# Patient Record
Sex: Female | Born: 1955
Health system: Southern US, Community
[De-identification: ages and names within clinical notes are randomized; demographics above are authoritative.]

## PROBLEM LIST (undated history)

## (undated) DIAGNOSIS — E789 Disorder of lipoprotein metabolism, unspecified: Secondary | ICD-10-CM

## (undated) DIAGNOSIS — G4730 Sleep apnea, unspecified: Secondary | ICD-10-CM

## (undated) DIAGNOSIS — N3000 Acute cystitis without hematuria: Secondary | ICD-10-CM

## (undated) DIAGNOSIS — Z9889 Other specified postprocedural states: Secondary | ICD-10-CM

## (undated) DIAGNOSIS — F32A Depression, unspecified: Secondary | ICD-10-CM

## (undated) DIAGNOSIS — M549 Dorsalgia, unspecified: Secondary | ICD-10-CM

## (undated) DIAGNOSIS — F419 Anxiety disorder, unspecified: Secondary | ICD-10-CM

## (undated) DIAGNOSIS — I5030 Unspecified diastolic (congestive) heart failure: Secondary | ICD-10-CM

## (undated) DIAGNOSIS — R7303 Prediabetes: Secondary | ICD-10-CM

## (undated) DIAGNOSIS — G43909 Migraine, unspecified, not intractable, without status migrainosus: Secondary | ICD-10-CM

## (undated) DIAGNOSIS — T8859XA Other complications of anesthesia, initial encounter: Secondary | ICD-10-CM

## (undated) DIAGNOSIS — E213 Hyperparathyroidism, unspecified: Secondary | ICD-10-CM

## (undated) DIAGNOSIS — E119 Type 2 diabetes mellitus without complications: Secondary | ICD-10-CM

## (undated) DIAGNOSIS — N189 Chronic kidney disease, unspecified: Secondary | ICD-10-CM

## (undated) DIAGNOSIS — M1990 Unspecified osteoarthritis, unspecified site: Secondary | ICD-10-CM

## (undated) DIAGNOSIS — R112 Nausea with vomiting, unspecified: Secondary | ICD-10-CM

## (undated) DIAGNOSIS — F329 Major depressive disorder, single episode, unspecified: Secondary | ICD-10-CM

## (undated) DIAGNOSIS — H269 Unspecified cataract: Secondary | ICD-10-CM

## (undated) DIAGNOSIS — E039 Hypothyroidism, unspecified: Secondary | ICD-10-CM

## (undated) DIAGNOSIS — I10 Essential (primary) hypertension: Secondary | ICD-10-CM

## (undated) DIAGNOSIS — G709 Myoneural disorder, unspecified: Secondary | ICD-10-CM

## (undated) DIAGNOSIS — M7062 Trochanteric bursitis, left hip: Secondary | ICD-10-CM

## (undated) DIAGNOSIS — M7061 Trochanteric bursitis, right hip: Secondary | ICD-10-CM

## (undated) DIAGNOSIS — I509 Heart failure, unspecified: Secondary | ICD-10-CM

## (undated) DIAGNOSIS — G8929 Other chronic pain: Secondary | ICD-10-CM

## (undated) DIAGNOSIS — R569 Unspecified convulsions: Secondary | ICD-10-CM

## (undated) DIAGNOSIS — K219 Gastro-esophageal reflux disease without esophagitis: Secondary | ICD-10-CM

## (undated) DIAGNOSIS — R55 Syncope and collapse: Secondary | ICD-10-CM

## (undated) DIAGNOSIS — E785 Hyperlipidemia, unspecified: Secondary | ICD-10-CM

## (undated) DIAGNOSIS — E7800 Pure hypercholesterolemia, unspecified: Secondary | ICD-10-CM

## (undated) DIAGNOSIS — F333 Major depressive disorder, recurrent, severe with psychotic symptoms: Secondary | ICD-10-CM

## (undated) DIAGNOSIS — R000 Tachycardia, unspecified: Secondary | ICD-10-CM

## (undated) DIAGNOSIS — G9340 Encephalopathy, unspecified: Secondary | ICD-10-CM

## (undated) HISTORY — DX: Acute cystitis without hematuria: N30.00

## (undated) HISTORY — DX: Syncope and collapse: R55

## (undated) HISTORY — DX: Tachycardia, unspecified: R00.0

## (undated) HISTORY — DX: Unspecified convulsions: R56.9

## (undated) HISTORY — DX: Hypothyroidism, unspecified: E03.9

## (undated) HISTORY — DX: Anxiety disorder, unspecified: F41.9

## (undated) HISTORY — PX: BACK SURGERY: SHX140

## (undated) HISTORY — DX: Encephalopathy, unspecified: G93.40

## (undated) HISTORY — DX: Hyperlipidemia, unspecified: E78.5

## (undated) HISTORY — DX: Hyperparathyroidism, unspecified: E21.3

## (undated) HISTORY — DX: Unspecified diastolic (congestive) heart failure: I50.30

## (undated) HISTORY — PX: CATARACT EXTRACTION: SUR2

## (undated) HISTORY — PX: KNEE SURGERY: SHX244

## (undated) HISTORY — DX: Unspecified cataract: H26.9

## (undated) HISTORY — DX: Major depressive disorder, recurrent, severe with psychotic symptoms: F33.3

## (undated) HISTORY — DX: Trochanteric bursitis, right hip: M70.61

## (undated) HISTORY — DX: Chronic kidney disease, unspecified: N18.9

## (undated) HISTORY — DX: Trochanteric bursitis, left hip: M70.62

## (undated) HISTORY — DX: Gastro-esophageal reflux disease without esophagitis: K21.9

## (undated) HISTORY — PX: ABDOMINAL HYSTERECTOMY: SHX81

---

## 1984-05-17 HISTORY — PX: APPENDECTOMY: SHX54

## 2001-10-30 ENCOUNTER — Other Ambulatory Visit: Admission: RE | Admit: 2001-10-30 | Discharge: 2001-10-30 | Payer: Self-pay | Admitting: Obstetrics and Gynecology

## 2001-11-01 ENCOUNTER — Ambulatory Visit (HOSPITAL_COMMUNITY): Admission: RE | Admit: 2001-11-01 | Discharge: 2001-11-01 | Payer: Self-pay | Admitting: Obstetrics and Gynecology

## 2001-11-01 ENCOUNTER — Encounter: Payer: Self-pay | Admitting: Obstetrics and Gynecology

## 2001-12-20 ENCOUNTER — Ambulatory Visit (HOSPITAL_COMMUNITY): Admission: RE | Admit: 2001-12-20 | Discharge: 2001-12-20 | Payer: Self-pay | Admitting: Obstetrics and Gynecology

## 2001-12-20 ENCOUNTER — Encounter: Payer: Self-pay | Admitting: Obstetrics and Gynecology

## 2002-02-27 ENCOUNTER — Encounter (INDEPENDENT_AMBULATORY_CARE_PROVIDER_SITE_OTHER): Payer: Self-pay | Admitting: Specialist

## 2002-02-28 ENCOUNTER — Inpatient Hospital Stay (HOSPITAL_COMMUNITY): Admission: RE | Admit: 2002-02-28 | Discharge: 2002-02-28 | Payer: Self-pay | Admitting: Obstetrics and Gynecology

## 2004-07-02 ENCOUNTER — Ambulatory Visit: Payer: Self-pay | Admitting: Cardiology

## 2004-07-14 ENCOUNTER — Ambulatory Visit: Payer: Self-pay | Admitting: Cardiology

## 2009-09-16 ENCOUNTER — Ambulatory Visit (HOSPITAL_COMMUNITY): Admission: RE | Admit: 2009-09-16 | Discharge: 2009-09-16 | Payer: Self-pay | Admitting: Internal Medicine

## 2009-09-30 ENCOUNTER — Encounter: Admission: RE | Admit: 2009-09-30 | Discharge: 2009-09-30 | Payer: Self-pay | Admitting: Internal Medicine

## 2009-12-02 ENCOUNTER — Emergency Department (HOSPITAL_COMMUNITY): Admission: EM | Admit: 2009-12-02 | Discharge: 2009-12-02 | Payer: Self-pay | Admitting: Family Medicine

## 2009-12-10 ENCOUNTER — Encounter: Admission: RE | Admit: 2009-12-10 | Discharge: 2010-02-12 | Payer: Self-pay | Admitting: Internal Medicine

## 2009-12-16 ENCOUNTER — Ambulatory Visit (HOSPITAL_COMMUNITY): Admission: RE | Admit: 2009-12-16 | Discharge: 2009-12-16 | Payer: Self-pay | Admitting: Internal Medicine

## 2010-05-14 ENCOUNTER — Ambulatory Visit (HOSPITAL_COMMUNITY)
Admission: RE | Admit: 2010-05-14 | Discharge: 2010-05-15 | Payer: Self-pay | Source: Home / Self Care | Attending: Neurosurgery | Admitting: Neurosurgery

## 2010-05-17 DIAGNOSIS — M7061 Trochanteric bursitis, right hip: Secondary | ICD-10-CM

## 2010-05-17 HISTORY — DX: Trochanteric bursitis, right hip: M70.61

## 2010-06-07 ENCOUNTER — Encounter: Payer: Self-pay | Admitting: Internal Medicine

## 2010-06-16 ENCOUNTER — Encounter (HOSPITAL_COMMUNITY)
Admission: RE | Admit: 2010-06-16 | Discharge: 2010-06-16 | Payer: Self-pay | Source: Home / Self Care | Attending: Neurosurgery | Admitting: Neurosurgery

## 2010-06-18 ENCOUNTER — Ambulatory Visit (HOSPITAL_COMMUNITY): Payer: Self-pay

## 2010-06-18 ENCOUNTER — Ambulatory Visit (HOSPITAL_COMMUNITY): Payer: PRIVATE HEALTH INSURANCE | Attending: Internal Medicine

## 2010-06-18 DIAGNOSIS — I10 Essential (primary) hypertension: Secondary | ICD-10-CM | POA: Insufficient documentation

## 2010-06-18 DIAGNOSIS — M545 Low back pain, unspecified: Secondary | ICD-10-CM | POA: Insufficient documentation

## 2010-06-18 DIAGNOSIS — IMO0001 Reserved for inherently not codable concepts without codable children: Secondary | ICD-10-CM | POA: Insufficient documentation

## 2010-06-18 DIAGNOSIS — M6281 Muscle weakness (generalized): Secondary | ICD-10-CM | POA: Insufficient documentation

## 2010-06-18 DIAGNOSIS — R262 Difficulty in walking, not elsewhere classified: Secondary | ICD-10-CM | POA: Insufficient documentation

## 2010-06-19 ENCOUNTER — Ambulatory Visit (HOSPITAL_COMMUNITY)
Admission: RE | Admit: 2010-06-19 | Discharge: 2010-06-19 | Disposition: A | Discharge status: Home / Self Care | Payer: PRIVATE HEALTH INSURANCE | Source: Ambulatory Visit | Attending: Neurosurgery | Admitting: Neurosurgery

## 2010-06-19 DIAGNOSIS — IMO0001 Reserved for inherently not codable concepts without codable children: Secondary | ICD-10-CM | POA: Insufficient documentation

## 2010-06-19 DIAGNOSIS — M545 Low back pain, unspecified: Secondary | ICD-10-CM | POA: Insufficient documentation

## 2010-06-19 DIAGNOSIS — R262 Difficulty in walking, not elsewhere classified: Secondary | ICD-10-CM | POA: Insufficient documentation

## 2010-06-19 DIAGNOSIS — I10 Essential (primary) hypertension: Secondary | ICD-10-CM | POA: Insufficient documentation

## 2010-06-19 DIAGNOSIS — M6281 Muscle weakness (generalized): Secondary | ICD-10-CM | POA: Insufficient documentation

## 2010-06-22 ENCOUNTER — Ambulatory Visit (HOSPITAL_COMMUNITY): Payer: Self-pay | Admitting: Physical Therapy

## 2010-06-24 ENCOUNTER — Ambulatory Visit (HOSPITAL_COMMUNITY): Payer: Self-pay | Admitting: Physical Therapy

## 2010-06-26 ENCOUNTER — Encounter (HOSPITAL_COMMUNITY)
Admission: RE | Admit: 2010-06-26 | Discharge: 2010-06-26 | Disposition: A | Discharge status: Home / Self Care | Payer: PRIVATE HEALTH INSURANCE | Source: Ambulatory Visit | Attending: Neurosurgery | Admitting: Neurosurgery

## 2010-07-08 ENCOUNTER — Other Ambulatory Visit (HOSPITAL_COMMUNITY): Payer: Self-pay | Admitting: Neurosurgery

## 2010-07-08 DIAGNOSIS — IMO0002 Reserved for concepts with insufficient information to code with codable children: Secondary | ICD-10-CM

## 2010-07-10 ENCOUNTER — Ambulatory Visit (HOSPITAL_COMMUNITY)
Admission: RE | Admit: 2010-07-10 | Discharge: 2010-07-10 | Disposition: A | Discharge status: Home / Self Care | Payer: PRIVATE HEALTH INSURANCE | Source: Ambulatory Visit | Attending: Neurosurgery | Admitting: Neurosurgery

## 2010-07-10 DIAGNOSIS — M79609 Pain in unspecified limb: Secondary | ICD-10-CM | POA: Insufficient documentation

## 2010-07-10 DIAGNOSIS — M545 Low back pain, unspecified: Secondary | ICD-10-CM | POA: Insufficient documentation

## 2010-07-10 DIAGNOSIS — IMO0002 Reserved for concepts with insufficient information to code with codable children: Secondary | ICD-10-CM

## 2010-07-10 DIAGNOSIS — M519 Unspecified thoracic, thoracolumbar and lumbosacral intervertebral disc disorder: Secondary | ICD-10-CM | POA: Insufficient documentation

## 2010-07-10 DIAGNOSIS — Z981 Arthrodesis status: Secondary | ICD-10-CM | POA: Insufficient documentation

## 2010-07-17 ENCOUNTER — Ambulatory Visit (HOSPITAL_COMMUNITY)
Admission: RE | Admit: 2010-07-17 | Discharge: 2010-07-17 | Disposition: A | Discharge status: Home / Self Care | Payer: PRIVATE HEALTH INSURANCE | Source: Ambulatory Visit | Attending: Neurosurgery | Admitting: Neurosurgery

## 2010-07-17 ENCOUNTER — Other Ambulatory Visit (HOSPITAL_COMMUNITY): Payer: Self-pay | Admitting: Neurosurgery

## 2010-07-17 DIAGNOSIS — M79609 Pain in unspecified limb: Secondary | ICD-10-CM | POA: Insufficient documentation

## 2010-07-17 DIAGNOSIS — IMO0002 Reserved for concepts with insufficient information to code with codable children: Secondary | ICD-10-CM

## 2010-07-17 DIAGNOSIS — Z981 Arthrodesis status: Secondary | ICD-10-CM | POA: Insufficient documentation

## 2010-07-17 DIAGNOSIS — M519 Unspecified thoracic, thoracolumbar and lumbosacral intervertebral disc disorder: Secondary | ICD-10-CM | POA: Insufficient documentation

## 2010-07-17 LAB — CREATININE, SERUM
Creatinine, Ser: 1.28 mg/dL — ABNORMAL HIGH (ref 0.4–1.2)
GFR calc Af Amer: 52 mL/min — ABNORMAL LOW (ref >60)
GFR calc non Af Amer: 43 mL/min — ABNORMAL LOW (ref >60)

## 2010-07-17 MED ORDER — GADOBENATE DIMEGLUMINE 529 MG/ML IV SOLN
15.0000 mL | Freq: Once | INTRAVENOUS | Status: AC
  Administered 2010-07-17: 15 mL via INTRAVENOUS

## 2010-07-27 LAB — BASIC METABOLIC PANEL
BUN: 15 mg/dL (ref 6–23)
CO2: 24 mEq/L (ref 19–32)
Calcium: 9.5 mg/dL (ref 8.4–10.5)
Chloride: 107 mEq/L (ref 96–112)
Creatinine, Ser: 1.28 mg/dL — ABNORMAL HIGH (ref 0.4–1.2)
GFR calc Af Amer: 52 mL/min — ABNORMAL LOW (ref >60)
GFR calc non Af Amer: 43 mL/min — ABNORMAL LOW (ref >60)
Glucose, Bld: 92 mg/dL (ref 70–99)
Potassium: 4.1 mEq/L (ref 3.5–5.1)
Sodium: 138 mEq/L (ref 135–145)

## 2010-07-27 LAB — ABO/RH: ABO/RH(D): O NEG

## 2010-07-27 LAB — CBC
HCT: 38.4 % (ref 36.0–46.0)
Hemoglobin: 12.3 g/dL (ref 12.0–15.0)
MCH: 28.6 pg (ref 26.0–34.0)
MCHC: 32 g/dL (ref 30.0–36.0)
MCV: 89.3 fL (ref 78.0–100.0)
Platelets: 244 10*3/uL (ref 150–400)
RBC: 4.3 MIL/uL (ref 3.87–5.11)
RDW: 14.6 % (ref 11.5–15.5)
WBC: 10.6 10*3/uL — ABNORMAL HIGH (ref 4.0–10.5)

## 2010-07-27 LAB — SURGICAL PCR SCREEN
MRSA, PCR: NEGATIVE
Staphylococcus aureus: POSITIVE — AB

## 2010-07-27 LAB — TYPE AND SCREEN
ABO/RH(D): O NEG
Antibody Screen: NEGATIVE

## 2010-08-11 ENCOUNTER — Ambulatory Visit: Payer: PRIVATE HEALTH INSURANCE | Attending: Neurosurgery | Admitting: Physical Therapy

## 2010-08-11 DIAGNOSIS — M25659 Stiffness of unspecified hip, not elsewhere classified: Secondary | ICD-10-CM | POA: Insufficient documentation

## 2010-08-11 DIAGNOSIS — M545 Low back pain, unspecified: Secondary | ICD-10-CM | POA: Insufficient documentation

## 2010-08-11 DIAGNOSIS — IMO0001 Reserved for inherently not codable concepts without codable children: Secondary | ICD-10-CM | POA: Insufficient documentation

## 2010-08-18 ENCOUNTER — Ambulatory Visit: Payer: PRIVATE HEALTH INSURANCE | Attending: Neurosurgery | Admitting: Physical Therapy

## 2010-08-18 DIAGNOSIS — M545 Low back pain, unspecified: Secondary | ICD-10-CM | POA: Insufficient documentation

## 2010-08-18 DIAGNOSIS — IMO0001 Reserved for inherently not codable concepts without codable children: Secondary | ICD-10-CM | POA: Insufficient documentation

## 2010-08-18 DIAGNOSIS — M25659 Stiffness of unspecified hip, not elsewhere classified: Secondary | ICD-10-CM | POA: Insufficient documentation

## 2010-08-20 ENCOUNTER — Ambulatory Visit: Payer: PRIVATE HEALTH INSURANCE | Admitting: Physical Therapy

## 2010-08-25 ENCOUNTER — Ambulatory Visit: Payer: PRIVATE HEALTH INSURANCE | Admitting: Physical Therapy

## 2010-08-27 ENCOUNTER — Ambulatory Visit: Payer: PRIVATE HEALTH INSURANCE | Admitting: Physical Therapy

## 2010-09-03 ENCOUNTER — Ambulatory Visit: Payer: PRIVATE HEALTH INSURANCE | Admitting: Physical Therapy

## 2010-09-04 ENCOUNTER — Ambulatory Visit: Payer: PRIVATE HEALTH INSURANCE | Admitting: Physical Therapy

## 2010-09-07 ENCOUNTER — Ambulatory Visit: Payer: PRIVATE HEALTH INSURANCE | Admitting: Physical Therapy

## 2010-09-15 ENCOUNTER — Ambulatory Visit: Payer: PRIVATE HEALTH INSURANCE | Attending: Neurosurgery | Admitting: Physical Therapy

## 2010-09-15 DIAGNOSIS — IMO0001 Reserved for inherently not codable concepts without codable children: Secondary | ICD-10-CM | POA: Insufficient documentation

## 2010-09-15 DIAGNOSIS — M545 Low back pain, unspecified: Secondary | ICD-10-CM | POA: Insufficient documentation

## 2010-09-15 DIAGNOSIS — M25659 Stiffness of unspecified hip, not elsewhere classified: Secondary | ICD-10-CM | POA: Insufficient documentation

## 2010-09-17 ENCOUNTER — Ambulatory Visit: Payer: PRIVATE HEALTH INSURANCE | Admitting: Physical Therapy

## 2010-09-22 ENCOUNTER — Ambulatory Visit: Payer: PRIVATE HEALTH INSURANCE | Admitting: Physical Therapy

## 2010-09-24 ENCOUNTER — Ambulatory Visit: Payer: PRIVATE HEALTH INSURANCE | Admitting: Physical Therapy

## 2010-09-29 ENCOUNTER — Ambulatory Visit: Payer: PRIVATE HEALTH INSURANCE | Admitting: Physical Therapy

## 2010-10-01 ENCOUNTER — Ambulatory Visit: Payer: PRIVATE HEALTH INSURANCE | Admitting: Physical Therapy

## 2010-10-02 NOTE — Op Note (Signed)
NAME:  Jennifer Davidson, DANTE NO.:  [id number]   MEDICAL RECORD NO.:  [id number]                   PATIENT TYPE:  AMB   LOCATION:  DAY                                  FACILITY:  [facility]   PHYSICIAN:  Malachi Pro. Ambrose Mantle, M.D.              DATE OF BIRTH:  1955-08-07   DATE OF PROCEDURE:  02/27/2002  DATE OF DISCHARGE:                                 OPERATIVE REPORT   PREOPERATIVE DIAGNOSES:  Severe menorrhagia and dysmenorrhea unresponsive to  medical therapy, recently developed left adnexal mass.   POSTOPERATIVE DIAGNOSES:  1. Endometriosis of the right fallopian tube at the site of the previous     tubal ligation.  2. Adherence of the left ovary to the left posterior broad ligament with a     probable corpus lutein cyst.   OPERATION:  Abdominal hysterectomy, bilateral salpingo-oophorectomy, lysis  of adhesions.   OPERATOR:  Malachi Pro. Ambrose Mantle, M.D.   ASSISTANT:  Zenaida Niece, M.D.   ANESTHESIA:  General.   DESCRIPTION OF PROCEDURE:  The patient was brought to the operating room and  placed under satisfactory general anesthesia.  The abdomen, vulva, vagina,  and urethra were prepped with Betadine solution and draped as a sterile  field.  The patient had had a previous transverse incision suprapubically.  This incision was utilized.  The incision was actually to the right of the  midline, so some of the incision was not used on the right side, and the  incision was extended a little to the left of the scar on the left side.  It  was carried in layers through the skin, subcutaneous tissue, and fascia.  The fascia was then divided transversely.  The rectus muscle was separated  from the pubis and superiorly, and then the peritoneum was opened  vertically.  Exploration of the upper abdomen revealed the liver to be  smooth; the gallbladder was smooth and cystic.  I could feel the right  kidney well.  It was normal.  The left kidney felt normal, although  I could  not feel it as well.  Exploration of the pelvis revealed some omental  adhesions to the peritoneum at the level of the umbilicus, and these were  divided.  There was also an adhesion of the cecum to the anterior cul-de-  sac, and this was divided.  The bowel was then packed away.  The uterus was  elevated into the operative field.  The posterior cul-de-sac was normal.  The uterus appeared relatively normal as did the anterior cul-de-sac.  There  was endometriosis at the slot where the right tube had been previously  ligated.  The right ovary appeared somewhat atrophic.  The left ovary was  approximately 4 x 4 cm and densely adherent to the left posterior broad  ligament.  Both round ligaments were divided with the electrical current;  bladder flap was  developed; bladder was pushed inferiorly.  The right  infundibulopelvic ligament was divided between clamps and doubly suture  ligated.  The left infundibulopelvic ligament was handled the same way.  I  then brought the ovary up from its dense attachments to the left posterior  broad ligament and in doing so, what appeared to be a corpus lutein cyst was  entered, and there was some tissue left on the posterior broad ligament.  This was teased away so that I felt like we had all the tissue removed  except for small amounts that were Bovied.  The parametrial and paracervical  tissues and uterosacral ligaments were clamped, cut, and suture ligated, and  we held the uterosacral ligaments.  I entered the left vaginal angle and  removed the uterus, tubes, and ovaries by transecting the upper vagina.  Bilateral vaginal angle sutures were placed, and then the central portion of  the cuff was closed with interrupted figure-of-eight sutures of 0 Vicryl.  Hemostasis was achieved.  Liberal irrigation confirmed hemostasis.  The  uterosacral ligaments were sutured together in the midline.  Both ureters  were identified.  I did enter the  retroperitoneal space on the left to  identify the ureter which was completely normal.  I had to take a couple of  sutures with right-angle clamps on the left posterior broad ligament to  achieve complete hemostasis.  The right ureter appeared normal.  Even though  the clinical findings of the adherence of the left ovary to the left  posterior broad ligament suggested endometriosis, I do not know that the  pathology report will confirm that.  All packs and retractors were removed.  The peritoneum was closed with a running suture of 0 Vicryl, the rectus  muscle with 0 Vicryl interrupted sutures.  The fascia was closed with two  running sutures of 0 Vicryl, subcu with a running 3-0 Vicryl, and the skin  was closed with automatic staples.  The patient seemed to tolerate the  procedure well and returned to recovery in satisfactory condition.  Blood  loss was estimated at 400 cc.                                               Malachi Pro. Ambrose Mantle, M.D.    TFH/MEDQ  D:  02/27/2002  T:  02/27/2002  Job:  [id number]

## 2010-10-02 NOTE — Discharge Summary (Signed)
NAME:  Jennifer Davidson, BERREY NO.:  [id number]   MEDICAL RECORD NO.:  [id number]                   PATIENT TYPE:  INP   LOCATION:  0450                                 FACILITY:  [facility]   PHYSICIAN:  Malachi Pro. Ambrose Mantle, M.D.              DATE OF BIRTH:  1955-05-23   DATE OF ADMISSION:  02/27/2002  DATE OF DISCHARGE:  02/28/2002                                 DISCHARGE SUMMARY   REASON FOR ADMISSION:  The patient is a 55 year old white female with  menorrhagia and dysmenorrhea unresponsive to medical therapy and recently  developed left adnexal mass for abdominal hysterectomy and bilateral  salpingo-oophorectomy.   HOSPITAL COURSE:  The patient underwent abdominal hysterectomy and bilateral  salpingo-oophorectomy.  The left ovary was densely adherent to the left  posterior broad ligament and was enlarged with what appeared to be a corpus  luteum cyst.  There was apparent endometriosis at the previous tubal  ligation site in the right tube.  Abdominal hysterectomy, bilateral salpingo-  oophorectomy, and lysis of adhesions were done by Dr. Ambrose Mantle with Dr.  Jackelyn Knife assisting under general anesthesia.  Postoperatively, the patient  did quite well.  She tolerated oral intake, she voided well without  difficulty, she ambulated well, and was ready for discharge at 5:50 p.m. on  the first day after surgery.   PATHOLOGY:  Peritoneal washings showed a thin prep cell block with no  malignant cells.   Path report showed uterus, cervix, fallopian tubes, and ovaries.  Cervix;  with nabothian cyst, no dysplasia.  Secretory endometrium, no evidence of  hyperplasia or malignancy, adenomyosis, leiomyomata inframural, benign  uterine serosa, no endometriosis or malignancy identified.  Right ovary;  benign follicular cysts, no endometriosis or malignancy identified.  Left  ovary; hemorrhagic corpus luteum cyst and focal endosalpingiosis, no  endometriosis or malignancy  identified.  Right fallopian tube; benign  paratubal cysts and findings consistent with previous tubal ligation, no  endometriosis identified.  Left fallopian tube; findings consistent with  previous tubal ligation.  Left broad ligament; findings consistent with  endometriosis, no evidence of malignancy.   LABORATORY DATA:  Initial hemoglobin 12.7, hematocrit 37.3, white count  7700, platelet count 270,000.  Follow up hematocrit is 35 and 33+.  Differential; 75 segs, 17 lymphs, 6 monos, 2 eosinophils, and no basophils.  Comprehensive metabolic profile was normal.  Urinalysis was negative.   FINAL DIAGNOSES:  1. Menorrhagia and dysmenorrhea unresponsive to medical therapy.  2. Leiomyomata uteri.  3. Adenomyosis.  4. Clinical diagnosis of endometriosis of the right fallopian tube at the     previous ligation site, not confirmed by pathology.  5. Dense adherence of the left ovary to the left posterior broad ligament     suggestive of endometriosis, not confirmed but suggested by pathology.   OPERATION:  1. Abdominal hysterectomy.  2. Bilateral salpingo-oophorectomy.  3. Lysis of adhesions.  FINAL CONDITION:  Improved.   INSTRUCTIONS:  Include our regular discharge instructions.  No vaginal  entrance, no heavy lifting or strenuous activity, call with any temperature  elevation greater than 100.4 degrees, call with any heavy vaginal bleeding,  and call with any unusual problems.  The patient is advised to eat some  solid food if desired but mainly concentrate on liquids until she is passing  flatus or having a bowel movement.  She is to return to the office in two to  five days for staple removal.   DISCHARGE MEDICATIONS:  Mepergan Fortis, 20 tablets, one every four to six  hours is given at discharge.                                               Malachi Pro. Ambrose Mantle, M.D.    TFH/MEDQ  D:  02/28/2002  T:  02/28/2002  Job:  [id number]

## 2010-10-02 NOTE — H&P (Signed)
NAME:  Jennifer Davidson, HARTMANN NO.:  [id number]   MEDICAL RECORD NO.:  [id number]                   PATIENT TYPE:  AMB   LOCATION:  DAY                                  [facility]ACILITY:  [facility]   PHYSICIAN:  Malachi Pro. Ambrose Mantle, M.D.              DATE OF BIRTH:  1955-09-29   DATE OF ADMISSION:  02/27/2002  DATE OF DISCHARGE:                                HISTORY & PHYSICAL   HISTORY OF PRESENT ILLNESS:  The patient is a 55 year old married female,  para 3-0-0-3, who is admitted to the hospital for abdominal hysterectomy and  bilateral salpingo-oophorectomy because of persistent menorrhagia and severe  dysmenorrhea.  This patient's last menstrual period was 02/04/02.  Her  periods last seven days, they are very heavy with clots, and she has severe  cramps, and always has had severe cramps.  She states that she has continued  to have severe cramps, heavy flow, large clots, uses five to six pads per  day, soils her pajamas and sheets at night, and she has been unresponsive to  non-steroidal anti-inflammatory drugs.  She was offered Lupron, D&C, birth  control pills, and hysterectomy.  She has undergone ultrasound exams which  initially showed an increased thickness of the endometrial lining, but  subsequently it diminished.  She had enlarged cystic areas in the right  ovary, but on followup ultrasound they had cleared or diminished in size.  The ultrasound suggested a small fibroid and a possible adenomyoma which  also was quite small.  The patient is admitted now for hysterectomy, and  endometrial biopsy was benign.   ALLERGIES:  CODEINE, it caused nausea and vomiting.   PAST MEDICAL HISTORY:  No illnesses.  No heart problems.  She has occasional  migraines.   HABITS:  No alcohol or tobacco.   MEDICATIONS:  1. Nexium p.r.n.  2. Xanax p.r.n.  3. Imitrex p.r.n.   PAST SURGICAL HISTORY:  Bilateral tubal ligation following her third  delivery.   FAMILY HISTORY:   Mother 53 with heart problems and diabetes.  Father 28 with  emphysema.  One sister with cancer.  Four brothers living and well.   PHYSICAL EXAMINATION:  GENERAL:  A well-developed, well-nourished white  female.  VITAL SIGNS:  Weight is 165 pounds, blood pressure 150/80, pulse 80.  HEENT:  No cranial abnormalities.  Nose and pharynx are clear.  NECK:  Supple without thyromegaly.  HEART:  Normal size and sounds.  No murmurs.  LUNGS:  Clear to auscultation and percussion.  BREASTS:  Soft without masses.  ABDOMEN:  Soft and nontender.  No masses are palpable.  Liver, spleen, and  kidney are not felt.  PELVIC:  Pap smear on 10/30/01, was within normal limits.  The vulva and  vagina are clean.  The cervix is clean.  The uterus is anterior, normal  size, tender to motion.  Adnexa are free of  masses on the right.  The left  ovary was felt to be 4 x 4 cm on 02/22/02.  RECTAL:  On 10/30/01, was negative.   IMPRESSION:  1. Menorrhagia, unresponsive to medical therapy.  2. Dysmenorrhea, unresponsive to medical therapy.   PLAN:  The patient is admitted for abdominal hysterectomy and bilateral  salpingo-oophorectomy.  She wants to have her ovaries removed at the time of  the hysterectomy.  If the left adnexal mass persists, an evaluation of it at  the time of surgery with consideration of frozen section will be done.  The  patient reports a good sex drive, having been re-married for 18 months.  She  has had some diypareunia recently.  She has been informed of the risks of  surgery, including, but not limited to heart attack, stroke, pulmonary  embolus, wound disruption, hemorrhage with need for re-operation and/or  transfusion, fistula formation, nerve injury, and intestinal obstruction.  She has also been informed that no prediction can be made to the effect of  the surgery on her sex drive and performance.  She understands and agrees to  proceed.                                                Malachi Pro. Ambrose Mantle, M.D.    TFH/MEDQ  D:  02/26/2002  T:  02/27/2002  Job:  [id number]

## 2010-10-13 ENCOUNTER — Ambulatory Visit: Payer: PRIVATE HEALTH INSURANCE | Admitting: Physical Therapy

## 2010-10-15 ENCOUNTER — Ambulatory Visit: Payer: PRIVATE HEALTH INSURANCE | Admitting: Physical Therapy

## 2010-10-15 ENCOUNTER — Ambulatory Visit: Payer: PRIVATE HEALTH INSURANCE | Admitting: Rehabilitation

## 2010-10-21 ENCOUNTER — Ambulatory Visit: Payer: PRIVATE HEALTH INSURANCE | Attending: Neurosurgery | Admitting: Physical Therapy

## 2010-10-21 DIAGNOSIS — M545 Low back pain, unspecified: Secondary | ICD-10-CM | POA: Insufficient documentation

## 2010-10-21 DIAGNOSIS — IMO0001 Reserved for inherently not codable concepts without codable children: Secondary | ICD-10-CM | POA: Insufficient documentation

## 2010-10-21 DIAGNOSIS — M25659 Stiffness of unspecified hip, not elsewhere classified: Secondary | ICD-10-CM | POA: Insufficient documentation

## 2010-10-23 ENCOUNTER — Ambulatory Visit: Payer: PRIVATE HEALTH INSURANCE | Admitting: Physical Therapy

## 2010-10-28 ENCOUNTER — Ambulatory Visit: Payer: PRIVATE HEALTH INSURANCE | Admitting: Rehabilitation

## 2010-10-29 ENCOUNTER — Ambulatory Visit: Payer: PRIVATE HEALTH INSURANCE | Admitting: Rehabilitation

## 2010-11-03 ENCOUNTER — Ambulatory Visit: Payer: PRIVATE HEALTH INSURANCE | Admitting: Physical Therapy

## 2010-11-04 ENCOUNTER — Other Ambulatory Visit (HOSPITAL_COMMUNITY): Payer: Self-pay | Admitting: Internal Medicine

## 2010-11-04 DIAGNOSIS — Z1231 Encounter for screening mammogram for malignant neoplasm of breast: Secondary | ICD-10-CM

## 2010-11-05 ENCOUNTER — Ambulatory Visit: Payer: PRIVATE HEALTH INSURANCE | Admitting: Physical Therapy

## 2010-11-11 ENCOUNTER — Ambulatory Visit: Payer: PRIVATE HEALTH INSURANCE | Admitting: Physical Therapy

## 2010-11-13 ENCOUNTER — Ambulatory Visit (HOSPITAL_COMMUNITY)
Admission: RE | Admit: 2010-11-13 | Discharge: 2010-11-13 | Disposition: A | Discharge status: Home / Self Care | Payer: 59 | Source: Ambulatory Visit | Attending: Internal Medicine | Admitting: Internal Medicine

## 2010-11-13 ENCOUNTER — Ambulatory Visit: Payer: PRIVATE HEALTH INSURANCE | Admitting: Physical Therapy

## 2010-11-13 ENCOUNTER — Other Ambulatory Visit (HOSPITAL_COMMUNITY): Payer: Self-pay | Admitting: Neurosurgery

## 2010-11-13 DIAGNOSIS — Z1231 Encounter for screening mammogram for malignant neoplasm of breast: Secondary | ICD-10-CM | POA: Insufficient documentation

## 2010-11-13 DIAGNOSIS — M5416 Radiculopathy, lumbar region: Secondary | ICD-10-CM

## 2010-11-16 ENCOUNTER — Ambulatory Visit (HOSPITAL_COMMUNITY)
Admission: RE | Admit: 2010-11-16 | Discharge: 2010-11-16 | Disposition: A | Discharge status: Home / Self Care | Payer: PRIVATE HEALTH INSURANCE | Source: Ambulatory Visit | Attending: Neurosurgery | Admitting: Neurosurgery

## 2010-11-16 DIAGNOSIS — M519 Unspecified thoracic, thoracolumbar and lumbosacral intervertebral disc disorder: Secondary | ICD-10-CM | POA: Insufficient documentation

## 2010-11-16 DIAGNOSIS — IMO0002 Reserved for concepts with insufficient information to code with codable children: Secondary | ICD-10-CM | POA: Insufficient documentation

## 2010-11-16 DIAGNOSIS — M79609 Pain in unspecified limb: Secondary | ICD-10-CM | POA: Insufficient documentation

## 2010-11-16 DIAGNOSIS — M6281 Muscle weakness (generalized): Secondary | ICD-10-CM | POA: Insufficient documentation

## 2010-11-16 DIAGNOSIS — M5416 Radiculopathy, lumbar region: Secondary | ICD-10-CM

## 2010-11-16 LAB — CREATININE, SERUM
Creatinine, Ser: 1.11 mg/dL — ABNORMAL HIGH (ref 0.50–1.10)
GFR calc Af Amer: >60 mL/min (ref >60)
GFR calc non Af Amer: 51 mL/min — ABNORMAL LOW (ref >60)

## 2010-11-16 MED ORDER — GADOBENATE DIMEGLUMINE 529 MG/ML IV SOLN
15.0000 mL | Freq: Once | INTRAVENOUS | Status: AC
  Administered 2010-11-16: 15 mL via INTRAVENOUS

## 2010-11-17 ENCOUNTER — Ambulatory Visit: Payer: PRIVATE HEALTH INSURANCE | Attending: Neurosurgery | Admitting: Physical Therapy

## 2010-11-17 DIAGNOSIS — IMO0001 Reserved for inherently not codable concepts without codable children: Secondary | ICD-10-CM | POA: Insufficient documentation

## 2010-11-17 DIAGNOSIS — M25659 Stiffness of unspecified hip, not elsewhere classified: Secondary | ICD-10-CM | POA: Insufficient documentation

## 2010-11-17 DIAGNOSIS — M545 Low back pain, unspecified: Secondary | ICD-10-CM | POA: Insufficient documentation

## 2010-11-24 ENCOUNTER — Ambulatory Visit: Payer: PRIVATE HEALTH INSURANCE | Admitting: Physical Therapy

## 2010-11-26 ENCOUNTER — Ambulatory Visit: Payer: PRIVATE HEALTH INSURANCE | Admitting: Physical Therapy

## 2010-12-01 ENCOUNTER — Other Ambulatory Visit: Payer: Self-pay | Admitting: Orthopedic Surgery

## 2010-12-01 ENCOUNTER — Ambulatory Visit
Admission: RE | Admit: 2010-12-01 | Discharge: 2010-12-01 | Disposition: A | Discharge status: Home / Self Care | Payer: PRIVATE HEALTH INSURANCE | Source: Ambulatory Visit | Attending: Orthopedic Surgery | Admitting: Orthopedic Surgery

## 2010-12-01 DIAGNOSIS — M79606 Pain in leg, unspecified: Secondary | ICD-10-CM

## 2010-12-21 ENCOUNTER — Encounter: Payer: Self-pay | Admitting: Physical Therapy

## 2010-12-25 ENCOUNTER — Ambulatory Visit: Payer: PRIVATE HEALTH INSURANCE | Attending: Neurosurgery | Admitting: Physical Therapy

## 2010-12-25 DIAGNOSIS — M545 Low back pain, unspecified: Secondary | ICD-10-CM | POA: Insufficient documentation

## 2010-12-25 DIAGNOSIS — M25659 Stiffness of unspecified hip, not elsewhere classified: Secondary | ICD-10-CM | POA: Insufficient documentation

## 2010-12-25 DIAGNOSIS — IMO0001 Reserved for inherently not codable concepts without codable children: Secondary | ICD-10-CM | POA: Insufficient documentation

## 2010-12-29 ENCOUNTER — Ambulatory Visit: Payer: PRIVATE HEALTH INSURANCE | Admitting: Physical Therapy

## 2011-01-05 ENCOUNTER — Ambulatory Visit: Payer: PRIVATE HEALTH INSURANCE | Admitting: Physical Therapy

## 2011-01-07 ENCOUNTER — Ambulatory Visit: Payer: PRIVATE HEALTH INSURANCE | Admitting: Physical Therapy

## 2011-01-12 ENCOUNTER — Ambulatory Visit: Payer: PRIVATE HEALTH INSURANCE | Admitting: Physical Therapy

## 2011-01-14 ENCOUNTER — Ambulatory Visit: Payer: PRIVATE HEALTH INSURANCE | Admitting: Physical Therapy

## 2011-02-04 ENCOUNTER — Other Ambulatory Visit (HOSPITAL_COMMUNITY): Payer: Self-pay | Admitting: Orthopedic Surgery

## 2011-02-04 DIAGNOSIS — M25559 Pain in unspecified hip: Secondary | ICD-10-CM

## 2011-02-04 DIAGNOSIS — M76899 Other specified enthesopathies of unspecified lower limb, excluding foot: Secondary | ICD-10-CM

## 2011-02-09 ENCOUNTER — Ambulatory Visit (HOSPITAL_COMMUNITY)
Admission: RE | Admit: 2011-02-09 | Discharge: 2011-02-09 | Disposition: A | Discharge status: Home / Self Care | Payer: PRIVATE HEALTH INSURANCE | Source: Ambulatory Visit | Attending: Orthopedic Surgery | Admitting: Orthopedic Surgery

## 2011-02-09 DIAGNOSIS — M76899 Other specified enthesopathies of unspecified lower limb, excluding foot: Secondary | ICD-10-CM | POA: Insufficient documentation

## 2011-02-09 DIAGNOSIS — M25559 Pain in unspecified hip: Secondary | ICD-10-CM

## 2011-02-09 DIAGNOSIS — M949 Disorder of cartilage, unspecified: Secondary | ICD-10-CM | POA: Insufficient documentation

## 2011-02-09 DIAGNOSIS — M899 Disorder of bone, unspecified: Secondary | ICD-10-CM | POA: Insufficient documentation

## 2011-03-25 ENCOUNTER — Other Ambulatory Visit (HOSPITAL_COMMUNITY): Payer: Self-pay | Admitting: Orthopedic Surgery

## 2011-03-25 DIAGNOSIS — M25559 Pain in unspecified hip: Secondary | ICD-10-CM

## 2011-03-25 DIAGNOSIS — W19XXXA Unspecified fall, initial encounter: Secondary | ICD-10-CM

## 2011-03-29 ENCOUNTER — Ambulatory Visit: Payer: PRIVATE HEALTH INSURANCE

## 2011-04-21 ENCOUNTER — Ambulatory Visit (HOSPITAL_COMMUNITY)
Admission: RE | Admit: 2011-04-21 | Discharge: 2011-04-21 | Disposition: A | Discharge status: Home / Self Care | Payer: PRIVATE HEALTH INSURANCE | Source: Ambulatory Visit | Attending: Orthopedic Surgery | Admitting: Orthopedic Surgery

## 2011-04-21 DIAGNOSIS — W19XXXA Unspecified fall, initial encounter: Secondary | ICD-10-CM

## 2011-04-21 DIAGNOSIS — M76899 Other specified enthesopathies of unspecified lower limb, excluding foot: Secondary | ICD-10-CM | POA: Insufficient documentation

## 2011-04-21 DIAGNOSIS — M899 Disorder of bone, unspecified: Secondary | ICD-10-CM | POA: Insufficient documentation

## 2011-04-21 DIAGNOSIS — M25559 Pain in unspecified hip: Secondary | ICD-10-CM

## 2011-04-21 LAB — CREATININE, SERUM
Creatinine, Ser: 1.12 mg/dL — ABNORMAL HIGH (ref 0.50–1.10)
GFR calc Af Amer: 62 mL/min — ABNORMAL LOW (ref >90)
GFR calc non Af Amer: 53 mL/min — ABNORMAL LOW (ref >90)

## 2011-04-21 MED ORDER — GADOBENATE DIMEGLUMINE 529 MG/ML IV SOLN
16.0000 mL | Freq: Once | INTRAVENOUS | Status: AC
  Administered 2011-04-21: 16 mL via INTRAVENOUS

## 2011-05-25 ENCOUNTER — Emergency Department (INDEPENDENT_AMBULATORY_CARE_PROVIDER_SITE_OTHER)
Admission: EM | Admit: 2011-05-25 | Discharge: 2011-05-25 | Disposition: A | Discharge status: Home / Self Care | Payer: 59 | Source: Home / Self Care | Attending: Family Medicine | Admitting: Family Medicine

## 2011-05-25 ENCOUNTER — Encounter: Payer: Self-pay | Admitting: *Deleted

## 2011-05-25 DIAGNOSIS — R6889 Other general symptoms and signs: Secondary | ICD-10-CM

## 2011-05-25 DIAGNOSIS — R112 Nausea with vomiting, unspecified: Secondary | ICD-10-CM

## 2011-05-25 HISTORY — DX: Disorder of lipoprotein metabolism, unspecified: E78.9

## 2011-05-25 HISTORY — DX: Essential (primary) hypertension: I10

## 2011-05-25 HISTORY — DX: Pure hypercholesterolemia, unspecified: E78.00

## 2011-05-25 MED ORDER — ONDANSETRON HCL 4 MG PO TABS: 8.0000 mg | ORAL_TABLET | Freq: Three times a day (TID) | ORAL | Status: AC | PRN

## 2011-05-25 MED ORDER — ONDANSETRON 4 MG PO TBDP
ORAL_TABLET | ORAL | Status: AC
  Filled 2011-05-25: qty 1

## 2011-05-25 MED ORDER — ONDANSETRON 4 MG PO TBDP
4.0000 mg | ORAL_TABLET | Freq: Once | ORAL | Status: AC
  Administered 2011-05-25: 4 mg via ORAL

## 2011-05-25 NOTE — ED Provider Notes (Signed)
History     CSN: [id number]  Arrival date & time 05/25/11  1625   First MD Initiated Contact with Patient 05/25/11 1628      Chief Complaint  Patient presents with  . Emesis    (Consider location/radiation/quality/duration/timing/severity/associated sxs/prior treatment) HPI Comments: Jennifer Davidson presents for evaluation of persistent cough, laryngitis, intermittent fever, and now persistent nausea and vomiting and inability to tolerate PO.   Patient is a 56 y.o. female presenting with vomiting. The history is provided by the patient and a relative.  Emesis  This is a new problem. The current episode started more than 2 days ago. The problem has not changed since onset.The maximum temperature recorded prior to her arrival was 101 to 101.9 F. Associated symptoms include abdominal pain and a fever.    Past Medical History  Diagnosis Date  . Hypertension   . Cholesterol serum increased     Past Surgical History  Procedure Date  . Abdominal hysterectomy     History reviewed. No pertinent family history.  History  Substance Use Topics  . Smoking status: Not on file  . Smokeless tobacco: Not on file  . Alcohol Use:     OB History    Grav Para Term Preterm Abortions TAB SAB Ect Mult Living                  Review of Systems  Constitutional: Positive for fever.  HENT: Positive for sore throat and voice change.   Eyes: Negative.   Respiratory: Negative.   Cardiovascular: Negative.   Gastrointestinal: Positive for nausea, vomiting and abdominal pain.  Genitourinary: Negative.   Musculoskeletal: Negative.   Skin: Negative.   Neurological: Negative.     Allergies  Review of patient's allergies indicates no known allergies.  Home Medications   Current Outpatient Rx  Name Route Sig Dispense Refill  . ONDANSETRON HCL 4 MG PO TABS Oral Take 2 tablets (8 mg total) by mouth every 8 (eight) hours as needed for nausea. 24 tablet 0    BP 130/69  Pulse 72  Temp(Src) 97.9 F  (36.6 C) (Oral)  Resp 18  SpO2 99%  Physical Exam  Nursing note and vitals reviewed. Constitutional: She is oriented to person, place, and time. She appears well-developed and well-nourished.  HENT:  Head: Normocephalic and atraumatic.  Right Ear: Tympanic membrane and ear canal normal.  Left Ear: Tympanic membrane and ear canal normal.  Mouth/Throat: Uvula is midline, oropharynx is clear and moist and mucous membranes are normal.  Eyes: EOM are normal.  Neck: Normal range of motion.  Cardiovascular: Normal rate, regular rhythm and normal heart sounds.   Pulmonary/Chest: Effort normal and breath sounds normal. She has no wheezes. She has no rhonchi.  Abdominal: Soft. Normal appearance and bowel sounds are normal. There is tenderness in the right lower quadrant.    Musculoskeletal: Normal range of motion.  Neurological: She is alert and oriented to person, place, and time.  Skin: Skin is warm and dry.  Psychiatric: Her behavior is normal.    ED Course  Procedures (including critical care time)  Labs Reviewed - No data to display No results found.   1. Influenza-like illness   2. Nausea & vomiting       MDM  Feels better and tolerating PO after administration of 8 mg ondansetron.         Richardo Priest, MD 05/25/11 2216

## 2011-05-25 NOTE — ED Notes (Signed)
Pt  Has  History  Of  Vomiting  Associated  With a  sorethroat      x  2  Days        She  Reports      Her  Last  Episode  Of  Vomiting  Was  Just  A  Few  Minutes  Ago  She  Is  Awake  Alert        Speaking in  Complete  sentances  Her  Skin  is  Warm  Dry  Slightly  Ruddy

## 2011-06-17 ENCOUNTER — Other Ambulatory Visit (HOSPITAL_COMMUNITY): Payer: Self-pay | Admitting: Orthopedic Surgery

## 2011-06-17 DIAGNOSIS — M5410 Radiculopathy, site unspecified: Secondary | ICD-10-CM

## 2011-06-21 ENCOUNTER — Ambulatory Visit (HOSPITAL_COMMUNITY)
Admission: RE | Admit: 2011-06-21 | Discharge: 2011-06-21 | Disposition: A | Discharge status: Home / Self Care | Payer: PRIVATE HEALTH INSURANCE | Source: Ambulatory Visit | Attending: Orthopedic Surgery | Admitting: Orthopedic Surgery

## 2011-06-21 DIAGNOSIS — M7130 Other bursal cyst, unspecified site: Secondary | ICD-10-CM | POA: Insufficient documentation

## 2011-06-21 DIAGNOSIS — M51379 Other intervertebral disc degeneration, lumbosacral region without mention of lumbar back pain or lower extremity pain: Secondary | ICD-10-CM | POA: Insufficient documentation

## 2011-06-21 DIAGNOSIS — M5410 Radiculopathy, site unspecified: Secondary | ICD-10-CM

## 2011-06-21 DIAGNOSIS — M47817 Spondylosis without myelopathy or radiculopathy, lumbosacral region: Secondary | ICD-10-CM | POA: Insufficient documentation

## 2011-06-21 DIAGNOSIS — M5137 Other intervertebral disc degeneration, lumbosacral region: Secondary | ICD-10-CM | POA: Insufficient documentation

## 2011-06-21 MED ORDER — GADOBENATE DIMEGLUMINE 529 MG/ML IV SOLN
19.0000 mL | Freq: Once | INTRAVENOUS | Status: AC | PRN
  Administered 2011-06-21: 19 mL via INTRAVENOUS

## 2012-01-07 ENCOUNTER — Other Ambulatory Visit (HOSPITAL_COMMUNITY): Payer: Self-pay | Admitting: Physical Medicine and Rehabilitation

## 2012-01-07 DIAGNOSIS — M549 Dorsalgia, unspecified: Secondary | ICD-10-CM

## 2012-01-07 DIAGNOSIS — M5416 Radiculopathy, lumbar region: Secondary | ICD-10-CM

## 2012-01-11 ENCOUNTER — Other Ambulatory Visit (HOSPITAL_COMMUNITY): Payer: Self-pay | Admitting: Physical Medicine and Rehabilitation

## 2012-01-11 ENCOUNTER — Ambulatory Visit (HOSPITAL_COMMUNITY)
Admission: RE | Admit: 2012-01-11 | Discharge: 2012-01-11 | Disposition: A | Discharge status: Home / Self Care | Payer: PRIVATE HEALTH INSURANCE | Source: Ambulatory Visit | Attending: Physical Medicine and Rehabilitation | Admitting: Physical Medicine and Rehabilitation

## 2012-01-11 DIAGNOSIS — M48061 Spinal stenosis, lumbar region without neurogenic claudication: Secondary | ICD-10-CM | POA: Insufficient documentation

## 2012-01-11 DIAGNOSIS — M549 Dorsalgia, unspecified: Secondary | ICD-10-CM

## 2012-01-11 DIAGNOSIS — M5416 Radiculopathy, lumbar region: Secondary | ICD-10-CM

## 2012-01-11 LAB — CREATININE, SERUM
Creatinine, Ser: 1.56 mg/dL — ABNORMAL HIGH (ref 0.50–1.10)
GFR calc non Af Amer: 36 mL/min — ABNORMAL LOW (ref >90)

## 2012-02-04 ENCOUNTER — Encounter: Payer: Self-pay | Admitting: Family Medicine

## 2012-02-04 ENCOUNTER — Ambulatory Visit (INDEPENDENT_AMBULATORY_CARE_PROVIDER_SITE_OTHER): Payer: BC Managed Care – PPO | Admitting: Family Medicine

## 2012-02-04 VITALS — BP 118/80 | HR 113 | Temp 97.8°F | Resp 16 | Ht 64.0 in | Wt 210.0 lb

## 2012-02-04 DIAGNOSIS — F419 Anxiety disorder, unspecified: Secondary | ICD-10-CM

## 2012-02-04 DIAGNOSIS — R609 Edema, unspecified: Secondary | ICD-10-CM | POA: Insufficient documentation

## 2012-02-04 DIAGNOSIS — I10 Essential (primary) hypertension: Secondary | ICD-10-CM

## 2012-02-04 DIAGNOSIS — F329 Major depressive disorder, single episode, unspecified: Secondary | ICD-10-CM | POA: Insufficient documentation

## 2012-02-04 DIAGNOSIS — F341 Dysthymic disorder: Secondary | ICD-10-CM

## 2012-02-04 DIAGNOSIS — F32A Depression, unspecified: Secondary | ICD-10-CM

## 2012-02-04 DIAGNOSIS — N189 Chronic kidney disease, unspecified: Secondary | ICD-10-CM | POA: Insufficient documentation

## 2012-02-04 DIAGNOSIS — Z79899 Other long term (current) drug therapy: Secondary | ICD-10-CM | POA: Insufficient documentation

## 2012-02-04 DIAGNOSIS — E039 Hypothyroidism, unspecified: Secondary | ICD-10-CM | POA: Insufficient documentation

## 2012-02-04 DIAGNOSIS — R000 Tachycardia, unspecified: Secondary | ICD-10-CM

## 2012-02-04 DIAGNOSIS — N1832 Chronic kidney disease, stage 3b: Secondary | ICD-10-CM | POA: Insufficient documentation

## 2012-02-04 DIAGNOSIS — E785 Hyperlipidemia, unspecified: Secondary | ICD-10-CM

## 2012-02-04 HISTORY — DX: Anxiety disorder, unspecified: F41.9

## 2012-02-04 HISTORY — DX: Depression, unspecified: F32.A

## 2012-02-04 HISTORY — DX: Other long term (current) drug therapy: Z79.899

## 2012-02-04 HISTORY — DX: Tachycardia, unspecified: R00.0

## 2012-02-04 HISTORY — DX: Edema, unspecified: R60.9

## 2012-02-04 LAB — CBC WITH DIFFERENTIAL/PLATELET
Basophils Relative: 0 % (ref 0–1)
Eosinophils Absolute: 0.7 10*3/uL (ref 0.0–0.7)
Eosinophils Relative: 8 % — ABNORMAL HIGH (ref 0–5)
Lymphs Abs: 2.9 10*3/uL (ref 0.7–4.0)
MCH: 28 pg (ref 26.0–34.0)
MCHC: 32.9 g/dL (ref 30.0–36.0)
MCV: 85.1 fL (ref 78.0–100.0)
Monocytes Relative: 10 % (ref 3–12)
Neutrophils Relative %: 50 % (ref 43–77)
Platelets: 269 10*3/uL (ref 150–400)
RBC: 5.04 MIL/uL (ref 3.87–5.11)

## 2012-02-04 LAB — COMPREHENSIVE METABOLIC PANEL
ALT: 21 U/L (ref 0–35)
Albumin: 4.2 g/dL (ref 3.5–5.2)
Alkaline Phosphatase: 55 U/L (ref 39–117)
Potassium: 4.1 mEq/L (ref 3.5–5.3)
Sodium: 140 mEq/L (ref 135–145)
Total Bilirubin: 0.4 mg/dL (ref 0.3–1.2)
Total Protein: 7 g/dL (ref 6.0–8.3)

## 2012-02-04 LAB — LIPID PANEL: Total CHOL/HDL Ratio: 4 Ratio

## 2012-02-04 LAB — TSH: TSH: 4.054 u[IU]/mL (ref 0.350–4.500)

## 2012-02-04 MED ORDER — FUROSEMIDE 20 MG PO TABS: 40.0000 mg | ORAL_TABLET | Freq: Two times a day (BID) | ORAL | Status: DC

## 2012-02-04 MED ORDER — CLONAZEPAM 0.5 MG PO TABS: 0.5000 mg | ORAL_TABLET | Freq: Two times a day (BID) | ORAL | Status: DC | PRN

## 2012-02-04 MED ORDER — CITALOPRAM HYDROBROMIDE 40 MG PO TABS: 40.0000 mg | ORAL_TABLET | Freq: Every day | ORAL | Status: DC

## 2012-02-04 MED ORDER — POTASSIUM CHLORIDE 20 MEQ PO PACK: 20.0000 meq | PACK | Freq: Every day | ORAL | Status: DC

## 2012-02-04 NOTE — Progress Notes (Signed)
Subjective:    Patient ID: Jennifer Davidson, female    DOB: 02/14/1956, 56 y.o.   MRN: [id number]  HPI  Pt lives in [place] but feels that she isn't getting enough care - not keeping up with all of her medicines.  She went to [facility] [place] to get an MRI but they told her she couldn't get the contrast due to elevated kidney function and then never told anything else about her kidneys.  So she wants to switch her PCP here.  Has been having increasing edema in legs and ankles intermittently x 3 mos.l  On lasix 20 qd but wasn't helping.  Has a h/o HTN but just for the past few wks her BP has been really low (90/60) so she stopped her toprol and then the swelling went down a little. No known h/o CKD, hypothyroid x 1 1/2 yrs, h/o rheumatic fever, severe depression and anxiety - being treated by PCP. Xanax not helping with anxiety and makes her sleepy.  Has had some panic attacks about once a wk and PSurgHx: hysterectomy 2004, L4-5 spinal fusion in 2011 - by Dr. Newell Coral, ex lap for appendectomy 27 yrs prev, normal svd x 3  Past Medical History  Diagnosis Date  . Hypertension   . Cholesterol serum increased   . Thyroid disease   . Sleep apnea   . Migraine   . Depression   . Neuromuscular disorder      Review of Systems  Constitutional: Positive for activity change, fatigue and unexpected weight change. Negative for fever, chills, diaphoresis and appetite change.  Eyes: Negative for visual disturbance.  Respiratory: Negative for cough and shortness of breath.   Cardiovascular: Positive for palpitations and leg swelling. Negative for chest pain.  Genitourinary: Negative for decreased urine volume.  Musculoskeletal: Positive for myalgias, back pain, joint swelling, arthralgias and gait problem.  Neurological: Positive for weakness. Negative for syncope.  Psychiatric/Behavioral: Positive for sleep disturbance, dysphoric mood and agitation. The patient is nervous/anxious.       BP 118/80  Pulse 113  Temp  97.8 F (36.6 C) (Oral)  Resp 16  Ht 5\' 4"  (1.626 m)  Wt 210 lb (95.255 kg)  BMI 36.05 kg/m2  SpO2 95% Objective:   Physical Exam  Constitutional: She is oriented to person, place, and time. She appears well-developed and well-nourished. No distress.  HENT:  Head: Normocephalic and atraumatic.  Right Ear: External ear normal.  Left Ear: External ear normal.  Eyes: Conjunctivae normal are normal. No scleral icterus.  Neck: Normal range of motion. Neck supple. No thyromegaly present.  Cardiovascular: Normal rate, regular rhythm, normal heart sounds and intact distal pulses.        2+ pitting edema to knees bilaterally  Pulmonary/Chest: Effort normal and breath sounds normal. No respiratory distress.  Musculoskeletal: She exhibits edema.  Lymphadenopathy:    She has no cervical adenopathy.  Neurological: She is alert and oriented to person, place, and time.  Skin: Skin is warm and dry. She is not diaphoretic. No erythema.  Psychiatric: She has a normal mood and affect. Her behavior is normal.      Assessment & Plan:  1. CKD - pt was unaware of this.  Looks like her GFR has been hoving from 40s-50s x 1 yr but was recently down to 36. Recheck. May need to stop lasix as may be dry since tachycardic.  If no sig improvement, rec renal US.  Need to get prior PCP records.   ]2. Edema -  Ok to cont lasix 40 bid w/ K 10 bid for now

## 2012-02-11 ENCOUNTER — Encounter (HOSPITAL_COMMUNITY): Payer: Self-pay | Admitting: Family Medicine

## 2012-02-11 ENCOUNTER — Emergency Department (HOSPITAL_COMMUNITY): Payer: BC Managed Care – PPO

## 2012-02-11 ENCOUNTER — Encounter: Payer: Self-pay | Admitting: Family Medicine

## 2012-02-11 ENCOUNTER — Ambulatory Visit (INDEPENDENT_AMBULATORY_CARE_PROVIDER_SITE_OTHER): Payer: BC Managed Care – PPO | Admitting: Family Medicine

## 2012-02-11 ENCOUNTER — Inpatient Hospital Stay (HOSPITAL_COMMUNITY)
Admission: EM | Admit: 2012-02-11 | Discharge: 2012-02-13 | DRG: 144 | Disposition: A | Discharge status: Home / Self Care | Payer: BC Managed Care – PPO | Attending: Family Medicine | Admitting: Family Medicine

## 2012-02-11 VITALS — BP 114/80 | HR 122 | Temp 97.9°F | Resp 16 | Ht 63.0 in | Wt 214.0 lb

## 2012-02-11 DIAGNOSIS — R5383 Other fatigue: Secondary | ICD-10-CM

## 2012-02-11 DIAGNOSIS — E785 Hyperlipidemia, unspecified: Secondary | ICD-10-CM | POA: Diagnosis present

## 2012-02-11 DIAGNOSIS — F32A Depression, unspecified: Secondary | ICD-10-CM | POA: Diagnosis present

## 2012-02-11 DIAGNOSIS — G4730 Sleep apnea, unspecified: Secondary | ICD-10-CM | POA: Diagnosis present

## 2012-02-11 DIAGNOSIS — R55 Syncope and collapse: Secondary | ICD-10-CM

## 2012-02-11 DIAGNOSIS — R600 Localized edema: Secondary | ICD-10-CM

## 2012-02-11 DIAGNOSIS — Z90710 Acquired absence of both cervix and uterus: Secondary | ICD-10-CM

## 2012-02-11 DIAGNOSIS — Z833 Family history of diabetes mellitus: Secondary | ICD-10-CM

## 2012-02-11 DIAGNOSIS — I10 Essential (primary) hypertension: Secondary | ICD-10-CM | POA: Diagnosis present

## 2012-02-11 DIAGNOSIS — R000 Tachycardia, unspecified: Secondary | ICD-10-CM

## 2012-02-11 DIAGNOSIS — R5381 Other malaise: Secondary | ICD-10-CM

## 2012-02-11 DIAGNOSIS — F419 Anxiety disorder, unspecified: Secondary | ICD-10-CM | POA: Diagnosis present

## 2012-02-11 DIAGNOSIS — F411 Generalized anxiety disorder: Secondary | ICD-10-CM | POA: Diagnosis present

## 2012-02-11 DIAGNOSIS — Y92009 Unspecified place in unspecified non-institutional (private) residence as the place of occurrence of the external cause: Secondary | ICD-10-CM

## 2012-02-11 DIAGNOSIS — F3289 Other specified depressive episodes: Secondary | ICD-10-CM | POA: Diagnosis present

## 2012-02-11 DIAGNOSIS — F329 Major depressive disorder, single episode, unspecified: Secondary | ICD-10-CM | POA: Diagnosis present

## 2012-02-11 DIAGNOSIS — I959 Hypotension, unspecified: Secondary | ICD-10-CM

## 2012-02-11 DIAGNOSIS — R609 Edema, unspecified: Secondary | ICD-10-CM

## 2012-02-11 DIAGNOSIS — N189 Chronic kidney disease, unspecified: Secondary | ICD-10-CM

## 2012-02-11 DIAGNOSIS — I129 Hypertensive chronic kidney disease with stage 1 through stage 4 chronic kidney disease, or unspecified chronic kidney disease: Secondary | ICD-10-CM | POA: Diagnosis present

## 2012-02-11 DIAGNOSIS — Z820 Family history of epilepsy and other diseases of the nervous system: Secondary | ICD-10-CM

## 2012-02-11 DIAGNOSIS — E039 Hypothyroidism, unspecified: Secondary | ICD-10-CM | POA: Diagnosis present

## 2012-02-11 DIAGNOSIS — N1832 Chronic kidney disease, stage 3b: Secondary | ICD-10-CM | POA: Insufficient documentation

## 2012-02-11 HISTORY — DX: Sleep apnea, unspecified: G47.30

## 2012-02-11 HISTORY — DX: Depression, unspecified: F32.A

## 2012-02-11 HISTORY — DX: Myoneural disorder, unspecified: G70.9

## 2012-02-11 HISTORY — DX: Migraine, unspecified, not intractable, without status migrainosus: G43.909

## 2012-02-11 HISTORY — DX: Major depressive disorder, single episode, unspecified: F32.9

## 2012-02-11 LAB — GLUCOSE, POCT (MANUAL RESULT ENTRY): POC Glucose: 97 mg/dl (ref 70–99)

## 2012-02-11 LAB — BASIC METABOLIC PANEL
CO2: 24 mEq/L (ref 19–32)
Chloride: 103 mEq/L (ref 96–112)
Creatinine, Ser: 1.14 mg/dL — ABNORMAL HIGH (ref 0.50–1.10)

## 2012-02-11 LAB — POCT CBC
HCT, POC: 40 % (ref 37.7–47.9)
Hemoglobin: 12.1 g/dL — AB (ref 12.2–16.2)
Lymph, poc: 2.7 (ref 0.6–3.4)
MCHC: 30.3 g/dL — AB (ref 31.8–35.4)
POC Granulocyte: 5.1 (ref 2–6.9)
WBC: 8.6 10*3/uL (ref 4.6–10.2)

## 2012-02-11 LAB — POCT I-STAT TROPONIN I: Troponin i, poc: 0 ng/mL (ref 0.00–0.08)

## 2012-02-11 NOTE — ED Notes (Signed)
Per family pt has been having syncopal episodes over the past week. . Denies hitting head. Pt also has been having BLE edema for months. sts SOB and dizziness.  denies chest pain.

## 2012-02-11 NOTE — Progress Notes (Signed)
[address]   [place], [place]  [address]   [phone]  Subjective:    Patient ID: Jennifer Davidson, female    DOB: 08-Nov-1955, 56 y.o.   MRN: [id number]  HPIThis 56 y.o. female presents for evaluation of the following:  1.  Leg swelling: One week follow-up; clinically worsening.   Evaluated by [name] one week ago due to swelling of lower extremities x 3-6 months.  Has since blacked out on two occasions since last visit.  At last visit, Lasix increased from Lasix 20mg  once daily to 40mg  twice daily.  No chest pain, +SOB which is new, +palpitations.    2.  Fatigue:  Not feeling well at all.   Onset five days ago.  Really draggy.  Unable to put one foot in front of other.  Syncope x 2 in past week.  Two new medications started last week --- Klonopin and Amrix.  3.  Syncope:  Blacked out while sitting at kitchen table.  Sister witnessed event.  Slumped over and loss of consciousness x 10 minutes.  Last night, standing up talking to sister; looking straight at sister and fell to ground.  Talking and not making any sense right before syncopal event.  Got a glassy look and then passed out; fell to the ground.  Sister had to feel if she was breathing.  Fell to side; unconscious for 5-10 minutes.  Second episode, out alteast seven minutes.  Fell straight to ground; fell to floor.  No seizure activity.  No incontinence.  No tongue biting.  No chest pain but did appear in respiratory distress.  Complaining of back pain.  Last syncopal event 9:00pm last night.  Living with sister for past four weeks.   No headache, blurred vision, diplopia, focal weakness, paresthesias.  +dizziness chronic issue for several months with worsening in past week.  4.  Anxiety:  Klonopin added last week by [name] due to chronic anxiety/depression.    5.  Chronic pain:  Pain specialist added Amrix one week ago.  Hurts all the time.   Chronic back pain for two years.     Review of Systems  Constitutional: Positive for diaphoresis and  fatigue. Negative for fever and chills.  Respiratory: Positive for shortness of breath. Negative for chest tightness and wheezing.   Cardiovascular: Positive for palpitations and leg swelling. Negative for chest pain.  Gastrointestinal: Negative for nausea, vomiting, abdominal pain and diarrhea.  Musculoskeletal: Positive for back pain.  Skin: Negative for rash.  Neurological: Positive for dizziness, syncope and light-headedness. Negative for tremors, seizures, facial asymmetry, speech difficulty, weakness, numbness and headaches.  Psychiatric/Behavioral: The patient is nervous/anxious.        Past Medical History  Diagnosis Date  . Hypertension   . Cholesterol serum increased     Past Surgical History  Procedure Date  . Abdominal hysterectomy     Prior to Admission medications   Medication Sig Start Date End Date Taking? Authorizing Provider  amitriptyline (ELAVIL) 10 MG tablet Take 10 mg by mouth at bedtime. [facility]   Yes Historical Provider, MD  buprenorphine (BUTRANS) 10 MCG/HR PTWK Place 10 mcg onto the skin once a week. [facility]   Yes Historical Provider, MD  buprenorphine (BUTRANS) 5 MCG/HR PTWK Place 5 mcg onto the skin once a week. [facility]   Yes Historical Provider, MD  citalopram (CELEXA) 40 MG tablet Take 1 tablet (40 mg total) by mouth daily. 02/04/12  Yes Norberto Sorenson, MD  clonazePAM (KLONOPIN) 0.5 MG tablet Take 1 tablet (0.5 mg total) by mouth 2 (two) times daily as needed for anxiety. 02/04/12  Yes Norberto Sorenson, MD  cyclobenzaprine (AMRIX) 15 MG 24 hr capsule Take 15 mg by mouth daily as needed. [facility]   Yes Historical Provider, MD  furosemide (LASIX) 20 MG tablet Take 2 tablets (40 mg total) by mouth 2 (two) times daily. [facility] 02/04/12  Yes Norberto Sorenson, MD  gabapentin (NEURONTIN) 300 MG capsule Take 300 mg by mouth 3 (three) times daily. [facility]   Yes Historical Provider, MD  hydrocodone-acetaminophen (LORCET-HD) 5-500 MG per  capsule Take 1 capsule by mouth every 6 (six) hours as needed. [facility]   Yes Historical Provider, MD  levothyroxine (SYNTHROID, LEVOTHROID) 25 MCG tablet Take 25 mcg by mouth daily. [facility]   Yes Historical Provider, MD  potassium chloride (K-LOR) 20 MEQ packet Take 20 mEq by mouth daily. 02/04/12  Yes Norberto Sorenson, MD  rosuvastatin (CRESTOR) 20 MG tablet Take 20 mg by mouth daily. [facility]   Yes Historical Provider, MD  topiramate (TOPAMAX) 100 MG tablet Take 100 mg by mouth 2 (two) times daily. [facility]   Yes Historical Provider, MD    No Known Allergies  History   Social History  . Marital Status: Married    Spouse Name: N/A    Number of Children: N/A  . Years of Education: N/A   Occupational History  . Not on file.   Social History Main Topics  . Smoking status: Never Smoker   . Smokeless tobacco: Not on file  . Alcohol Use: Not on file  . Drug Use: Not on file  . Sexually Active: Not on file   Other Topics Concern  . Not on file   Social History Narrative  . No narrative on file    No family history on file.  Objective:   Physical Exam  Nursing note and vitals reviewed. Constitutional: She is oriented to person, place, and time. She appears well-developed and well-nourished. No distress.  HENT:  Head: Normocephalic and atraumatic.  Right Ear: External ear normal.  Left Ear: External ear normal.  Nose: Nose normal.  Mouth/Throat: No oropharyngeal exudate.  Eyes: Conjunctivae normal and EOM are normal. Pupils are equal, round, and reactive to light.  Neck: Normal range of motion. Neck supple. No thyromegaly present.  Cardiovascular: Regular rhythm and intact distal pulses.  Exam reveals no gallop and no friction rub.   Murmur heard.      No carotid bruits.  Pulmonary/Chest: Effort normal and breath sounds normal. No respiratory distress. She has no wheezes. She has no rales.  Abdominal: Soft. Bowel sounds are normal. She exhibits no distension  and no mass. There is no tenderness. There is no rebound and no guarding.  Musculoskeletal: She exhibits edema.  Lymphadenopathy:    She has no cervical adenopathy.  Neurological: She is alert and oriented to person, place, and time. No cranial nerve deficit. She exhibits normal muscle tone. Coordination normal.  Skin: Skin is warm and dry. No rash noted. She is not diaphoretic. No erythema. No pallor.  Psychiatric: She has a normal mood and affect. Her behavior is normal. Judgment and thought content normal.    Results for orders placed in visit on 02/11/12  POCT CBC      Component Value Range   WBC 8.6  4.6 - 10.2 K/uL   Lymph, poc 2.7  0.6 - 3.4   POC  LYMPH PERCENT 31.4  10 - 50 %L   MID (cbc) 0.8  0 - 0.9   POC MID % 9.6  0 - 12 %M   POC Granulocyte 5.1  2 - 6.9   Granulocyte percent 59.0  37 - 80 %G   RBC 4.45  4.04 - 5.48 M/uL   Hemoglobin 12.1 (*) 12.2 - 16.2 g/dL   HCT, POC 40.9  81.1 - 47.9 %   MCV 90.0  80 - 97 fL   MCH, POC 27.2  27 - 31.2 pg   MCHC 30.3 (*) 31.8 - 35.4 g/dL   RDW, POC 91.4     Platelet Count, POC 262  142 - 424 K/uL   MPV 8.4  0 - 99.8 fL  GLUCOSE, POCT (MANUAL RESULT ENTRY)      Component Value Range   POC Glucose 97  70 - 99 mg/dl    EKG:  Sinus tachy at 113; mild ST depression V4-6.      Assessment & Plan:   1. Syncope  EKG 12-Lead, POCT CBC, POCT glucose (manual entry)  2. Tachycardia    3. Fatigue    4. Edema of extremities       1. Syncope:  New.  Two episodes of prolonged syncope in past week. To ED for further evaluation; warrants cardiac enzymes, CT head, STAT labs to evaluate electrolytes.  No appropriate to work up as outpatient.  Need to rule out cardiac event, seizure activity, acute CVA. 2.  Tachycardia: New/persistent.  Recent increase in Lasix dose; dehydration may be contributing.  TSH one week ago normal.  With recent syncope, warrants telemetry/Holter monitor to evaluate rate. 3.  Edema BLE:  Worsening in past week despite  increase in Lasix.  Associated with fatigue, shortness of breath. Concern for underlying CHF new onset. Warrants cardiac enzymes, CXR, 2D-echo.   4.  Fatigue/Malaise: New.  Onset in past week.  Addition of two medications in past week (Klonopin, Amrix) and increase in Lasix dose.  Associated with worsening edema, syncope new onset.  To ED for further evaluation.

## 2012-02-11 NOTE — ED Notes (Signed)
Pt st's right arm is starting to feel numb st's it has been doing this off and on today.  Pt only c/o back pain which is chronic.   Family at bedside.

## 2012-02-12 ENCOUNTER — Encounter (HOSPITAL_COMMUNITY): Payer: Self-pay | Admitting: Family Medicine

## 2012-02-12 ENCOUNTER — Inpatient Hospital Stay (HOSPITAL_COMMUNITY): Payer: BC Managed Care – PPO

## 2012-02-12 DIAGNOSIS — R55 Syncope and collapse: Secondary | ICD-10-CM | POA: Diagnosis present

## 2012-02-12 HISTORY — DX: Syncope and collapse: R55

## 2012-02-12 LAB — COMPREHENSIVE METABOLIC PANEL
Albumin: 3.1 g/dL — ABNORMAL LOW (ref 3.5–5.2)
Alkaline Phosphatase: 53 U/L (ref 39–117)
BUN: 10 mg/dL (ref 6–23)
Chloride: 108 mEq/L (ref 96–112)
Potassium: 3.3 mEq/L — ABNORMAL LOW (ref 3.5–5.1)
Total Bilirubin: 0.4 mg/dL (ref 0.3–1.2)

## 2012-02-12 LAB — CBC
Platelets: 205 10*3/uL (ref 150–400)
RDW: 14.5 % (ref 11.5–15.5)
WBC: 7.1 10*3/uL (ref 4.0–10.5)

## 2012-02-12 LAB — RAPID URINE DRUG SCREEN, HOSP PERFORMED
Barbiturates: NOT DETECTED
Opiates: NOT DETECTED
Tetrahydrocannabinol: NOT DETECTED

## 2012-02-12 LAB — D-DIMER, QUANTITATIVE: D-Dimer, Quant: 0.92 ug/mL-FEU — ABNORMAL HIGH (ref 0.00–0.48)

## 2012-02-12 MED ORDER — LEVOTHYROXINE SODIUM 25 MCG PO TABS
25.0000 ug | ORAL_TABLET | Freq: Every day | ORAL | Status: DC
  Administered 2012-02-12 – 2012-02-13 (×2): 25 ug via ORAL
  Filled 2012-02-12 (×4): qty 1

## 2012-02-12 MED ORDER — SODIUM CHLORIDE 0.9 % IV SOLN: INTRAVENOUS | Status: DC

## 2012-02-12 MED ORDER — ALPRAZOLAM 0.5 MG PO TABS: 0.5000 mg | ORAL_TABLET | Freq: Every evening | ORAL | Status: DC | PRN

## 2012-02-12 MED ORDER — SODIUM CHLORIDE 0.9 % IV SOLN
INTRAVENOUS | Status: DC
  Administered 2012-02-12 (×2): via INTRAVENOUS

## 2012-02-12 MED ORDER — POLYETHYLENE GLYCOL 3350 17 G PO PACK: 17.0000 g | PACK | Freq: Every day | ORAL | Status: DC | PRN

## 2012-02-12 MED ORDER — BUPRENORPHINE 5 MCG/HR TD PTWK: 5.0000 ug | MEDICATED_PATCH | TRANSDERMAL | Status: DC

## 2012-02-12 MED ORDER — POTASSIUM CHLORIDE CRYS ER 20 MEQ PO TBCR
40.0000 meq | EXTENDED_RELEASE_TABLET | ORAL | Status: AC
  Administered 2012-02-12 (×2): 40 meq via ORAL
  Filled 2012-02-12 (×2): qty 2

## 2012-02-12 MED ORDER — ATORVASTATIN CALCIUM 40 MG PO TABS
40.0000 mg | ORAL_TABLET | Freq: Every day | ORAL | Status: DC
  Administered 2012-02-12: 40 mg via ORAL
  Filled 2012-02-12 (×2): qty 1

## 2012-02-12 MED ORDER — ACETAMINOPHEN 325 MG PO TABS
650.0000 mg | ORAL_TABLET | Freq: Four times a day (QID) | ORAL | Status: DC | PRN
  Administered 2012-02-12 – 2012-02-13 (×2): 650 mg via ORAL
  Filled 2012-02-12 (×2): qty 2

## 2012-02-12 MED ORDER — SODIUM CHLORIDE 0.9 % IV BOLUS (SEPSIS)
1000.0000 mL | Freq: Once | INTRAVENOUS | Status: AC
  Administered 2012-02-12: 1000 mL via INTRAVENOUS

## 2012-02-12 MED ORDER — TOPIRAMATE 100 MG PO TABS
100.0000 mg | ORAL_TABLET | Freq: Every day | ORAL | Status: DC
  Administered 2012-02-12 – 2012-02-13 (×2): 100 mg via ORAL
  Filled 2012-02-12 (×2): qty 1

## 2012-02-12 MED ORDER — GABAPENTIN 300 MG PO CAPS
300.0000 mg | ORAL_CAPSULE | Freq: Three times a day (TID) | ORAL | Status: DC
  Administered 2012-02-12 – 2012-02-13 (×5): 300 mg via ORAL
  Filled 2012-02-12 (×6): qty 1

## 2012-02-12 MED ORDER — AMITRIPTYLINE HCL 10 MG PO TABS
10.0000 mg | ORAL_TABLET | Freq: Every day | ORAL | Status: DC
  Administered 2012-02-13: 10 mg via ORAL
  Filled 2012-02-12 (×2): qty 1

## 2012-02-12 MED ORDER — BUPRENORPHINE 10 MCG/HR TD PTWK: 10.0000 ug | MEDICATED_PATCH | TRANSDERMAL | Status: DC

## 2012-02-12 MED ORDER — SODIUM CHLORIDE 0.9 % IJ SOLN
3.0000 mL | Freq: Two times a day (BID) | INTRAMUSCULAR | Status: DC
  Administered 2012-02-12: 3 mL via INTRAVENOUS

## 2012-02-12 MED ORDER — CITALOPRAM HYDROBROMIDE 40 MG PO TABS
40.0000 mg | ORAL_TABLET | Freq: Every day | ORAL | Status: DC
  Administered 2012-02-12 – 2012-02-13 (×2): 40 mg via ORAL
  Filled 2012-02-12 (×2): qty 1

## 2012-02-12 MED ORDER — ACETAMINOPHEN 650 MG RE SUPP: 650.0000 mg | Freq: Four times a day (QID) | RECTAL | Status: DC | PRN

## 2012-02-12 NOTE — H&P (Signed)
Family Medicine Teaching [facility] Admission History and Physical Service Pager: [phone]  Patient name: Jennifer Davidson Medical record number: [id number] Date of birth: 1956-04-11 Age: 56 y.o. Gender: female  Primary Care Provider: Lysle Rubens, MD  Chief Complaint: Syncope  History of Present Illness: Jennifer Davidson is a 56 y.o. year old female with a history of CKD, HTN, hypothyroid and depression presenting with multiple syncopal episodes since 02/07/2012. She began to take colonipine, flexeril and lasix on Monday (9/20). On the 23rd she noticed she was weaker than usual. At suppertime, her family noticed she strated to [facility] and not make sense in her speech, she then reports everything "went black" and she passed out for 10 minutes according to her family. Family had to drag her into her bedroom, which the patient states she does not recall. The patient added, earlier in the same day she experienced a few episodes where she thought she heard her  brother calling her name. She even turned around and spoke to him, but he was not there. That evening after the syncopal event her family held her medications. She did not have an episode the following day, but she reports she felt groggy.   The next event was on Wednesday (9/25) in the afternoon. She was standing at the time, talking to her sister and she became "glassed over" and fell to the side into a pile of clothes. Per family report, she was passed out for 7-8 minutes, breathing shallow. She remembers directly after this event, as they sat her up to the side of the bed. She went straight to bed that evening. She denies hitting her head, seizure activity, shaking or incontinence, nausea, vomit or diarrhea. She admits to unintentional weight gain over the last few months.  She was in pain from the fall d/t chronic back issues. Thursday night she experienced the same type of event again while standing. She was seen at [facility] today, and they sent her  to the ED.   Has been hypotensive since July on a few noted occassions while having procedures completed for chronic pain. Has not been taking hypertension medications, just lasix.     Patient Active Problem List  Diagnosis  . Hypertension  . Chronic kidney disease  . Edema  . Tachycardia  . Encounter for long-term (current) use of other medications  . Anxiety and depression  . Hypothyroid  . Hyperlipidemia  . Syncope and collapse    Past Medical History: Past Medical History  Diagnosis Date  . Hypertension   . Cholesterol serum increased   . Thyroid disease   . Sleep apnea   . Migraine   . Depression   . Neuromuscular disorder    Past Surgical History: Past Surgical History  Procedure Date  . Abdominal hysterectomy   . Back surgery    Social History: History  Substance Use Topics  . Smoking status: Never Smoker   . Smokeless tobacco: Not on file  . Alcohol Use: No   For any additional social history documentation, please refer to relevant sections of EMR.  Family History: Family History  Problem Relation Age of Onset  . Diabetes Mother   . Heart disease Mother   . Kidney disease Mother   . Thyroid disease Mother   . Heart disease Father   . Seizures Father   . COPD Father   . Cancer Maternal Grandmother    Allergies: No Known Allergies No current facility-administered medications on file prior to encounter.  Current Outpatient Prescriptions on File Prior to Encounter  Medication Sig Dispense Refill  . amitriptyline (ELAVIL) 10 MG tablet Take 10 mg by mouth at bedtime.       . buprenorphine (BUTRANS) 10 MCG/HR PTWK Place 10 mcg onto the skin once a week. Applies on Thursday      . buprenorphine (BUTRANS) 5 MCG/HR PTWK Place 5 mcg onto the skin once a week. Applies on Friday      . citalopram (CELEXA) 40 MG tablet Take 1 tablet (40 mg total) by mouth daily.  30 tablet  3  . clonazePAM (KLONOPIN) 0.5 MG tablet Take 1 tablet (0.5 mg total) by mouth 2  (two) times daily as needed for anxiety.  20 tablet  1  . cyclobenzaprine (AMRIX) 15 MG 24 hr capsule Take 15 mg by mouth daily as needed. For pain      . gabapentin (NEURONTIN) 300 MG capsule Take 300 mg by mouth 3 (three) times daily.       Marland Kitchen levothyroxine (SYNTHROID, LEVOTHROID) 25 MCG tablet Take 25 mcg by mouth daily.       . potassium chloride SA (K-DUR,KLOR-CON) 20 MEQ tablet Take 40 mEq by mouth 2 (two) times daily.      . rosuvastatin (CRESTOR) 20 MG tablet Take 20 mg by mouth daily.       Marland Kitchen topiramate (TOPAMAX) 100 MG tablet Take 100 mg by mouth daily.        Review Of Systems: Per HPI  Otherwise 12 point review of systems was performed and was unremarkable.  Physical Exam: BP 105/59  Pulse 100  Temp 98.5 F (36.9 C)  Resp 14  SpO2 97% Exam: General: NAD. Alert. Oriented. Cooperative. Sister and daughter with her. HEENT: [place]. AT. EENT: WNL. PERLA. EOMi. Cardiovascular: Tachycardia. RR. S1S2. No murmur, rubs, gallops. Respiratory: CTAB. No wheezing, crackles or rhonchi. Abdomen: Obese. Soft. NT. ND. No masses or HSM. BS+ Extremities: ROM WNL. Right leg TTP (chronic). Bilateral +1/4 pitting edema in her feet. Pulses 1/4 bilaterally LE. 2/4 bilateral UE. Muscle strength 5/5 UE/LE bilaterally.  Skin: warm and dry. No rash. Neuro: II-XII grossly intact. No focal; deficits noted.   Labs and Imaging: Dg Chest 2 View  02/11/2012  *RADIOLOGY REPORT*  Clinical Data: Lower extremity swelling.  Syncope.  Shortness of breath.  CHEST - 2 VIEW  Comparison: 05/07/2010.  Findings: Trachea is midline.  Heart size normal.  Lungs are clear. No pleural fluid.  IMPRESSION: No acute findings.   Original Report Authenticated By: Reyes Ivan, M.D.    Ct Head Wo Contrast  02/12/2012  *RADIOLOGY REPORT*  Clinical Data: Loss of consciousness  CT HEAD WITHOUT CONTRAST  Technique:  Contiguous axial images were obtained from the base of the skull through the vertex without contrast.  Comparison:  None.  Findings: No acute intracranial hemorrhage.  No focal mass lesion. No CT evidence of acute infarction.   No midline shift or mass effect.  No hydrocephalus.  Basilar cisterns are patent.  IMPRESSION: Normal CT  head   Original Report Authenticated By: Genevive Bi, M.D.     CBC BMET   Lab 02/11/12 1647  WBC 8.6  HGB 12.1*  HCT 40.0  PLT --    Lab 02/11/12 1941  NA 140  K 3.6  CL 103  CO2 24  BUN 10  CREATININE 1.14*  GLUCOSE 97  CALCIUM 10.0    Assessment and Plan: NADIRAH FINNEN is a 56 y.o. year old female  with a history of CKD, HTN, hypothyroid and depression presenting with multiple syncopal episodes since 02/07/2012.   Possible etiologies hypotension: medication vs orthostatic or ICB.  1. Syncope: - Telemetry Bed: rule out arrhythmia  - CT head w/o contrast: WNL--> no mass or ICB - EKG: Sinus Tach. Non-specific T-wave abnormality - Frequent neuro checks - Labs: CBC, CMP, BNP pending  2. H/o HTN: Hypotensive on admission - held lasix - Orthostatic VS  3. Hypothyroid: - TSH: WNL 9/20 - Continue Synthroid 25 mcg  4. [place]/migraine: - continue amitriptyline, citalopram, gabapentin, topiramate - Held: flexeril, Klonopin  5. FEN/GI: -  Regular diet - MIVF NS 125 mL/hr - Tylenol PRN - Miralax PRN  Prophylaxis: SCD's - hold on Heparin until ICB ruled out. Code status: Full code Disposition: Pending stabilization of syncopal episodes, PT/OT evaluation.  Felix Pacini, DO 02/12/2012, 1:46 AM   _________________________________________ Upper level addendum  Pt seen and agree w/ above note  Gen: Mild distress HEENT: TM normal bilat., oropharynx clear CV: RRR, no m/r/g Res: Normal effort, CTAB Ext: 1+ LE edema Neuro: CN intact, cerebellar function normal. Proprioception and cerebellar function normal, strength 3+ bilat in UE and LE   Shelly Flatten, MD Family Medicine PGY-2 02/12/2012, 7:42 AM

## 2012-02-12 NOTE — H&P (Signed)
I have seen and examined this patient. I have discussed with Dr Konrad Dolores & .  I agree with their findings and plans as documented in their admission note.  Acute Issues 1. Syncope, recurrent.  - Twice with either transition to stand or standing, once seated. - No prodrome, though groggy feeling post-event - Recent additions of Klonopin by PCP and Flexeril by Pain specialist. Recent increase in furosemide dose by PCP for peripheral edema patient concern.  - Hgb 11.1, ProBNP 225. - CHEST XRAY (2V): No Acute findings  - EKG: NSR, normal ECG. Normal QT.  - Working diagnosis : Medication induced from new psychotropes, anticholinergic and increased furosemide dose in setting of chronic topiramate, gabapentin, buprenorphine, and amitriptyline treatments  Plan: complete 24 hour monitor, ambulate in hall, IVF volume replacement, decrease lasix to home 20 mg twice daily, stop Klonopin, Pt may have her Xanax 0.5 mg tablet at bedtime as needed for sleep. Stop Flexeril.  Continue Butrans patch, topiramate, gabapentin, and amitriptyline and monitor for excess sedation.  Patient should wear her CPCP for her OSA inhouse.   Anticipate DC tomorrow morning if hospital course uneventful.

## 2012-02-12 NOTE — ED Provider Notes (Signed)
History     CSN: [id number]  Arrival date & time 02/11/12  1731   First MD Initiated Contact with Patient 02/11/12 2301      Chief Complaint  Patient presents with  . Loss of Consciousness  . Leg Swelling    (Consider location/radiation/quality/duration/timing/severity/associated sxs/prior treatment) Patient is a 56 y.o. female presenting with syncope. The history is provided by the patient. No language interpreter was used.  Loss of Consciousness This is a new problem. The current episode started more than 2 days ago. The problem occurs rarely. The problem has not changed since onset.Associated symptoms include shortness of breath. Pertinent negatives include no chest pain, no abdominal pain and no headaches. Nothing aggravates the symptoms. Nothing relieves the symptoms. She has tried nothing for the symptoms. The treatment provided no relief.  Worsening peripheral edema. Syncope several times this week with progressive SOB.  No CP.  No n/v/d.    Past Medical History  Diagnosis Date  . Hypertension   . Cholesterol serum increased   . Thyroid disease     Past Surgical History  Procedure Date  . Abdominal hysterectomy   . Back surgery     No family history on file.  History  Substance Use Topics  . Smoking status: Never Smoker   . Smokeless tobacco: Not on file  . Alcohol Use: No    OB History    Grav Para Term Preterm Abortions TAB SAB Ect Mult Living                  Review of Systems  Constitutional: Negative for fever.  Respiratory: Positive for shortness of breath. Negative for chest tightness.   Cardiovascular: Positive for leg swelling and syncope. Negative for chest pain.  Gastrointestinal: Negative for abdominal pain.  Neurological: Negative for headaches.  All other systems reviewed and are negative.    Allergies  Review of patient's allergies indicates no known allergies.  Home Medications   Current Outpatient Rx  Name Route Sig Dispense  Refill  . AMITRIPTYLINE HCL 10 MG PO TABS Oral Take 10 mg by mouth at bedtime.     Marland Kitchen BUPRENORPHINE 10 MCG/HR TD PTWK Transdermal Place 10 mcg onto the skin once a week. Applies on Thursday    . BUPRENORPHINE 5 MCG/HR TD PTWK Transdermal Place 5 mcg onto the skin once a week. Applies on Friday    . CITALOPRAM HYDROBROMIDE 40 MG PO TABS Oral Take 1 tablet (40 mg total) by mouth daily. 30 tablet 3  . CLONAZEPAM 0.5 MG PO TABS Oral Take 1 tablet (0.5 mg total) by mouth 2 (two) times daily as needed for anxiety. 20 tablet 1  . CYCLOBENZAPRINE HCL ER 15 MG PO CP24 Oral Take 15 mg by mouth daily as needed. For pain    . FUROSEMIDE 20 MG PO TABS Oral Take 40 mg by mouth 2 (two) times daily.    Marland Kitchen GABAPENTIN 300 MG PO CAPS Oral Take 300 mg by mouth 3 (three) times daily.     Marland Kitchen LEVOTHYROXINE SODIUM 25 MCG PO TABS Oral Take 25 mcg by mouth daily.     Marland Kitchen POTASSIUM CHLORIDE CRYS ER 20 MEQ PO TBCR Oral Take 40 mEq by mouth 2 (two) times daily.    Marland Kitchen ROSUVASTATIN CALCIUM 20 MG PO TABS Oral Take 20 mg by mouth daily.     . TOPIRAMATE 100 MG PO TABS Oral Take 100 mg by mouth daily.       BP 105/59  Pulse 100  Temp 98.5 F (36.9 C)  Resp 14  SpO2 97%  Physical Exam  Constitutional: She is oriented to person, place, and time. She appears well-developed and well-nourished.  HENT:  Head: Normocephalic and atraumatic.  Mouth/Throat: Oropharynx is clear and moist.  Eyes: Conjunctivae normal are normal. Pupils are equal, round, and reactive to light.  Neck: Normal range of motion. Neck supple.  Cardiovascular: Normal rate, regular rhythm and intact distal pulses.   Pulmonary/Chest: Effort normal and breath sounds normal. She has no wheezes. She has no rales.  Abdominal: Soft. Bowel sounds are normal. There is no tenderness. There is no rebound and no guarding.  Musculoskeletal: She exhibits edema.  Neurological: She is alert and oriented to person, place, and time.  Skin: Skin is warm and dry. She is not  diaphoretic.  Psychiatric: She has a normal mood and affect.    ED Course  Procedures (including critical care time)  Labs Reviewed  BASIC METABOLIC PANEL - Abnormal; Notable for the following:    Creatinine, Ser 1.14 (*)     GFR calc non Af Amer 52 (*)     GFR calc Af Amer 60 (*)     All other components within normal limits  POCT I-STAT TROPONIN I   Dg Chest 2 View  02/11/2012  *RADIOLOGY REPORT*  Clinical Data: Lower extremity swelling.  Syncope.  Shortness of breath.  CHEST - 2 VIEW  Comparison: 05/07/2010.  Findings: Trachea is midline.  Heart size normal.  Lungs are clear. No pleural fluid.  IMPRESSION: No acute findings.   Original Report Authenticated By: Reyes Ivan, M.D.      No diagnosis found.    MDM   Date: 02/12/2012  Rate:105  Rhythm: sinus tachycardia  QRS Axis: normal  Intervals: normal  ST/T Wave abnormalities: nonspecific ST changes  Conduction Disutrbances:none  Narrative Interpretation:   Old EKG Reviewed: none available        Tylen Leverich K Elmore Hyslop-Rasch, MD 02/12/12 0021

## 2012-02-12 NOTE — Evaluation (Signed)
Physical Therapy Evaluation Patient Details Name: Jennifer Davidson MRN: [id number] DOB: Apr 21, 1956 Today's Date: 02/12/2012 Time: 0922-1005 PT Time Calculation (min): 43 min  PT Assessment / Plan / Recommendation Clinical Impression  Patient is a 56 yo female admitted with multiple syncopal episodes, with history of chronic back pain.  Evaluated patient for dizziness - no issues with smooth pursuits, gaze stability.  No dizziness or nystagmus noted with positional changes.  No symptoms of vertigo.  Patient is limited in mobility by her chronic back pain and is receiving care from pain clinic.  Patient will benefit from acute PT services to maximize independence prior to return home with husband.    PT Assessment  Patient needs continued PT services    Follow Up Recommendations  No PT follow up;Supervision - Intermittent (Patient declining HHPT - followed by pain clinic)    Barriers to Discharge None      Equipment Recommendations  None recommended by PT    Recommendations for Other Services     Frequency Min 3X/week    Precautions / Restrictions Precautions Precautions: Fall Precaution Comments: Back pain can make right knee buckle Restrictions Weight Bearing Restrictions: No   Pertinent Vitals/Pain Pain limiting mobility.      Mobility  Bed Mobility Bed Mobility: Rolling Right;Right Sidelying to Sit;Sitting - Scoot to Edge of Bed Rolling Right: 6: Modified independent (Device/Increase time);With rail Right Sidelying to Sit: 5: Supervision;With rails;HOB flat Sitting - Scoot to Edge of Bed: 6: Modified independent (Device/Increase time);With rail Details for Bed Mobility Assistance: Verbal cues to roll for ease of transition Transfers Transfers: Sit to Stand;Stand to Sit Sit to Stand: 4: Min assist;With upper extremity assist;From bed;From toilet Stand to Sit: 4: Min guard;With upper extremity assist;To toilet;To chair/3-in-1;With armrests Details for Transfer Assistance:  Verbal cues for hand placement and safety.  Assist to initiate standing from toilet, and for balance. Ambulation/Gait Ambulation/Gait Assistance: 4: Min guard Ambulation Distance (Feet): 72 Feet Assistive device: Rolling walker Ambulation/Gait Assistance Details: Verbal cues to stand upright during gait.  Patient with slow deliberate gait with short step length.  Verbal cues for safety during turns. Gait Pattern: Step-through pattern;Decreased step length - left;Decreased stance time - right;Decreased stride length;Right foot flat;Trunk flexed Gait velocity: Slow gait speed           PT Diagnosis: Difficulty walking;Abnormality of gait;Acute pain  PT Problem List: Decreased strength;Decreased activity tolerance;Decreased balance;Decreased mobility;Cardiopulmonary status limiting activity;Pain PT Treatment Interventions: DME instruction;Gait training;Functional mobility training;Patient/family education   PT Goals Acute Rehab PT Goals PT Goal Formulation: With patient Time For Goal Achievement: 02/19/12 Potential to Achieve Goals: Good Pt will go Sit to Stand: with modified independence;with upper extremity assist PT Goal: Sit to Stand - Progress: Goal set today Pt will go Stand to Sit: with modified independence;with upper extremity assist PT Goal: Stand to Sit - Progress: Goal set today Pt will Ambulate: >150 feet;with modified independence;with rolling walker PT Goal: Ambulate - Progress: Goal set today  Visit Information  Last PT Received On: 02/12/12 Assistance Needed: +1    Subjective Data  Subjective: "I go to [facility] for my back pain." Patient Stated Goal: To decrease pain.  To go home.   Prior Functioning  Home Living Lives With: Spouse Available Help at Discharge: Family;Available 24 hours/day Type of Home: House Home Access: Stairs to enter [employer] of Steps: 1 Entrance Stairs-Rails: None Home Layout: One level Bathroom Shower/Tub:  Health visitor: Standard Bathroom Accessibility: Yes How  Accessible: Accessible via walker Home Adaptive Equipment: Shower chair with back;Walker - rolling;Straight cane;Grab bars around toilet Prior Function Level of Independence: Independent with assistive device(s);Needs assistance Needs Assistance: Light Housekeeping Light Housekeeping: Moderate Able to Take Stairs?: Yes (with assistance) Driving: Yes Communication Communication: No difficulties Dominant Hand: Right    Cognition  Overall Cognitive Status: Appears within functional limits for tasks assessed/performed Arousal/Alertness: Awake/alert Orientation Level: Oriented X4 / Intact Behavior During Session: [facility] for tasks performed    Extremity/Trunk Assessment Right Upper Extremity Assessment RUE ROM/Strength/Tone: Within functional levels RUE Sensation: WFL - Light Touch Left Upper Extremity Assessment LUE ROM/Strength/Tone: Within functional levels LUE Sensation: WFL - Light Touch Right Lower Extremity Assessment RLE ROM/Strength/Tone: Deficits;Unable to fully assess;Due to pain RLE ROM/Strength/Tone Deficits: Strength grossly 4-/5 with dorsiflex 3/5; pain limiting assessment RLE Sensation: Deficits RLE Sensation Deficits: Decreased to light touch distally Left Lower Extremity Assessment LLE ROM/Strength/Tone: WFL for tasks assessed LLE Sensation: WFL - Light Touch   Balance    End of Session PT - End of Session Equipment Utilized During Treatment: Gait belt Activity Tolerance: Patient limited by fatigue;Patient limited by pain Patient left: in chair;with call bell/phone within reach;with family/visitor present Nurse Communication: Mobility status  GP     Vena Austria 02/12/2012, 10:25 AM Durenda Hurt. Renaldo Fiddler, [facility] Acute Rehab Services Pager [phone]

## 2012-02-13 LAB — BASIC METABOLIC PANEL
BUN: 9 mg/dL (ref 6–23)
Chloride: 111 mEq/L (ref 96–112)
GFR calc Af Amer: 62 mL/min — ABNORMAL LOW (ref >90)
Potassium: 4 mEq/L (ref 3.5–5.1)

## 2012-02-13 LAB — CBC
HCT: 34.2 % — ABNORMAL LOW (ref 36.0–46.0)
Hemoglobin: 11.1 g/dL — ABNORMAL LOW (ref 12.0–15.0)
RBC: 3.93 MIL/uL (ref 3.87–5.11)
WBC: 6.3 10*3/uL (ref 4.0–10.5)

## 2012-02-13 MED ORDER — FUROSEMIDE 20 MG PO TABS: 20.0000 mg | ORAL_TABLET | Freq: Two times a day (BID) | ORAL | Status: DC

## 2012-02-13 NOTE — Progress Notes (Signed)
Daily Progress Note Family Medicine Teaching Service PGY-2   Patient name: Jennifer Davidson  Medical record [id number] Date of birth:[birth year] Age: 56 y.o. Gender: female  LOS: 2 days   Subjective: No events overnight.   Objective: Vitals: Filed Vitals:   02/13/12 0500  BP: 133/79  Pulse: 105  Temp: 97.9 F (36.6 C)  Resp: 18    Intake/Output Summary (Last 24 hours) at 02/13/12 0925 Last data filed at 02/13/12 0500  Gross per 24 hour  Intake    480 ml  Output   2100 ml  Net  -1620 ml   Physical Exam: Gen:  NAD HEENT: Moist mucous membranes CV: Regular rate and rhythm, no murmurs rubs or gallops PULM: Clear to auscultation bilaterally. No wheezes/rales/rhonchi ABD: Soft, non tender, non distended, normal bowel sounds EXT: No edema  Neuro: Alert and oriented x3. No focalization  Labs and imaging:  CBC  Lab 02/13/12 0640 02/12/12 0608 02/11/12 1647  WBC 6.3 7.1 8.6  HGB 11.1* 11.1* 12.1*  HCT 34.2* 33.2* 40.0  PLT 175 205 --   BMET  Lab 02/13/12 0640 02/12/12 0608 02/11/12 1941  NA 141 142 140  K 4.0 3.3* 3.6  CL 111 108 103  CO2 21 25 24   BUN 9 10 10   CREATININE 1.11* 1.12* 1.14*  LABGLOM -- -- --  GLUCOSE 104* -- --  CALCIUM 9.2 9.2 10.0   Results for orders placed during the hospital encounter of 02/11/12 (from the past 24 hour(s))  URINE RAPID DRUG SCREEN (HOSP PERFORMED)     Status: Normal   Collection Time   02/12/12 11:48 AM      Component Value Range   Opiates NONE DETECTED  NONE DETECTED   Cocaine NONE DETECTED  NONE DETECTED   Benzodiazepines NONE DETECTED  NONE DETECTED   Amphetamines NONE DETECTED  NONE DETECTED   Tetrahydrocannabinol NONE DETECTED  NONE DETECTED   Barbiturates NONE DETECTED  NONE DETECTED   Dg Chest 2 View 02/11/2012 MPRESSION: No acute findings.   Ct Head Wo Contrast 02/12/2012  IMPRESSION: Normal CT  head     Medications: Scheduled Meds:   . amitriptyline  10 mg Oral QHS  . atorvastatin  40 mg Oral q1800  .  buprenorphine  5 mcg Transdermal Weekly  . citalopram  40 mg Oral Daily  . gabapentin  300 mg Oral TID  . levothyroxine  25 mcg Oral Q0600  . potassium chloride  40 mEq Oral Q4H  . sodium chloride  3 mL Intravenous Q12H  . topiramate  100 mg Oral Daily   Continuous Infusions:   . sodium chloride 125 mL/hr at 02/12/12 2326   PRN Meds:.acetaminophen, acetaminophen, ALPRAZolam, polyethylene glycol  Assessment and Plan: Jennifer Davidson is a 56 y.o. year old female with a history of CKD, HTN, hypothyroid and depression presenting with multiple syncopal episodes since 02/07/2012  1. Syncope: No events during hospitalization. No alarms on telemetry.  Lasix decreased to 20 Mg BID, Klonopin and Flexeril dis continued.  2. Hx of HTN: Hypotensive on admission. Has kept low to normal BP with reduction of lasix. Pt on no antihypertensive pharmacologic treatment regiment at home - Continue with reduced dose of furosemide.   3. Hypothyroidism: continue levothyroxine 25 mcg daily 4. MSK: pain controlled during admission even with holding flexeril. - Continue amitriptyline, gabapentin, buprenorphine. 5. Anxiety/Depression:  - Discontinued klonopin, continue citalopram  6. Migraine: no exacerbation during admission. - Continue Topiramate.    FEN/GI: KVO. HH diet  Prophylaxis:  SCD's Disposition: PT with no recommendations for f/u. Possible going home today with close follow up.   D. Piloto Rolene Arbour, MD PGY2, Family Medicine Teaching Service  02/13/2012

## 2012-02-13 NOTE — Progress Notes (Signed)
Reviewed and agree.

## 2012-02-13 NOTE — Progress Notes (Signed)
I discussed with Dr Piloto.  I agree with their plans documented in their progress note for today.  

## 2012-02-13 NOTE — Progress Notes (Deleted)
  Subjective:    Patient ID: Jennifer Davidson, female    DOB: May 25, 1955, 56 y.o.   MRN: [id number]  HPI    Review of Systems     Objective:   Physical Exam        Assessment & Plan:

## 2012-02-14 NOTE — Discharge Summary (Signed)
Physician Discharge Summary  Patient ID: Jennifer Davidson MRN: [id number] DOB/AGE: 58-18-1957 58 y.o.  Admit date: 02/11/2012 Discharge date: 02/14/2012  Admission Diagnoses: syncopal episodes Discharge Diagnoses:  Principal Problem:  *Syncope and collapse Active Problems:  Hypertension  Chronic kidney disease  Tachycardia  Anxiety and depression  Hypothyroid   Discharged Condition: good  Hospital Course: Jennifer Davidson is a 56 y.o. year old female with a history of CKD, HTN, hypothyroid and depression presenting with multiple syncopal episodes since 02/07/2012. She began to take new medications colonipine, flexeril and lasix on Monday (9/20). She had three syncopal events that lasted anywhere from 7-10 minutes each during the week. Patient ran a low BP during admission. Patient was discontinued from the Klonopin and Amrix, and had no events on telemetry or during her hospital stay. Her lasix dose was lowered from 40 mg BID to 20 mg BID. It is believed these episodes are the result of hypotension/medications.    Consults: None  Significant Diagnostic Studies: Dg Chest 2 View 02/11/2012 MPRESSION: No acute findings.  Ct Head Wo Contrast 02/12/2012 IMPRESSION: Normal CT head   Treatments: IV hydration  Discharge Exam: Blood pressure 133/79, pulse 105, temperature 97.9 F (36.6 C), temperature source Oral, resp. rate 18, height 5\' 3"  (1.6 m), weight 212 lb 1.3 oz (96.2 kg), SpO2 96.00%. Gen: NAD  HEENT: Moist mucous membranes  CV: Regular rate and rhythm, no murmurs rubs or gallops  PULM: Clear to auscultation bilaterally. No wheezes/rales/rhonchi  ABD: Soft, non tender, non distended, normal bowel sounds  EXT: No edema  Neuro: Alert and oriented x3. No focalization Per Dr. Aviva Signs  Disposition: 01-Home or Self Care  Discharge Orders    Future Appointments: Provider: Department: Dept Phone: Center:   02/18/2012 3:45 PM Norberto Sorenson, MD [facility] [phone] [facility]         Medication List     As of 02/14/2012  2:40 PM    STOP taking these medications         clonazePAM 0.5 MG tablet   Commonly known as: KLONOPIN      cyclobenzaprine 15 MG 24 hr capsule   Commonly known as: AMRIX      TAKE these medications         amitriptyline 10 MG tablet   Commonly known as: ELAVIL   Take 10 mg by mouth at bedtime.      BUTRANS 5 MCG/HR Ptwk   Generic drug: buprenorphine   Place 5 mcg onto the skin once a week. Applies on Friday      BUTRANS 10 MCG/HR Ptwk   Generic drug: buprenorphine   Place 10 mcg onto the skin once a week. Applies on Thursday      citalopram 40 MG tablet   Commonly known as: CELEXA   Take 1 tablet (40 mg total) by mouth daily.      furosemide 20 MG tablet   Commonly known as: LASIX   Take 1 tablet (20 mg total) by mouth 2 (two) times daily.      gabapentin 300 MG capsule   Commonly known as: NEURONTIN   Take 300 mg by mouth 3 (three) times daily.      levothyroxine 25 MCG tablet   Commonly known as: SYNTHROID, LEVOTHROID   Take 25 mcg by mouth daily.      potassium chloride SA 20 MEQ tablet   Commonly known as: K-DUR,KLOR-CON   Take 40 mEq by mouth 2 (two) times daily.  rosuvastatin 20 MG tablet   Commonly known as: CRESTOR   Take 20 mg by mouth daily.      topiramate 100 MG tablet   Commonly known as: TOPAMAX   Take 100 mg by mouth daily.           Follow-up Information    Follow up with [facility]. (make a follow up appointment in 7-10 days)    Contact information:   [address] [place] [address] [phone]         Signed: Felix Pacini 02/14/2012, 2:40 PM

## 2012-02-16 ENCOUNTER — Encounter: Payer: Self-pay | Admitting: Family Medicine

## 2012-02-18 ENCOUNTER — Ambulatory Visit (INDEPENDENT_AMBULATORY_CARE_PROVIDER_SITE_OTHER): Payer: BC Managed Care – PPO | Admitting: Family Medicine

## 2012-02-18 ENCOUNTER — Encounter: Payer: Self-pay | Admitting: Family Medicine

## 2012-02-18 VITALS — BP 130/68 | HR 122 | Temp 98.3°F | Resp 16 | Ht 62.5 in | Wt 210.2 lb

## 2012-02-18 DIAGNOSIS — F419 Anxiety disorder, unspecified: Secondary | ICD-10-CM

## 2012-02-18 DIAGNOSIS — I10 Essential (primary) hypertension: Secondary | ICD-10-CM

## 2012-02-18 DIAGNOSIS — E785 Hyperlipidemia, unspecified: Secondary | ICD-10-CM

## 2012-02-18 DIAGNOSIS — N189 Chronic kidney disease, unspecified: Secondary | ICD-10-CM

## 2012-02-18 DIAGNOSIS — R0600 Dyspnea, unspecified: Secondary | ICD-10-CM

## 2012-02-18 DIAGNOSIS — Z23 Encounter for immunization: Secondary | ICD-10-CM

## 2012-02-18 DIAGNOSIS — R064 Hyperventilation: Secondary | ICD-10-CM

## 2012-02-18 DIAGNOSIS — R609 Edema, unspecified: Secondary | ICD-10-CM

## 2012-02-18 NOTE — Progress Notes (Signed)
Subjective:    Patient ID: Jennifer Davidson, female    DOB: Dec 08, 1955, 56 y.o.   MRN: [id number]  HPI  Pt was hospitalized last wk after two syncopal episodes - sent to ER from [facility] after eval by Dr. Katrinka Blazing. It seems to ultimately have been attributed to dehydration from her increased lasix dose but her cyclobenzaprine and clonazapam were also stopped due to daytime fatigue.  She was on telemetry during her hospitalization but she did not have an echo though on admission her bnp was mid-200s.  Since her hosp she is feeling a little better. Her swelling appears to be better though not gone.  Her lasix dose was cut back to 20 bid.  She is still with DOE and orthopnea.    Anxiety a little better since her citalopram has been increased - she only had to cry 2d this wk.  She restarted xanax 1/2 tab qhs once her clonazepam was d/c'd - it helps her calm down to go sleep right away but then will wake up after 2 hrs and then stay awake for 3-4 hrs.  Has tried taking another amitriptyline after she awakens but doesn't seem to help.  When we switched her to the clonazepam, she felt like it worked well for her and denies feeling any lingering daytime fatigue from it - this is clearly noted in the hosp d/c summ but pt denies.  Pt has chronic pain and chronic HAs managed by pain management. But prior to entering pain management she would take many doses of several different otc nsaids daily for years. Review of Systems  Constitutional: Positive for fatigue. Negative for fever, chills, diaphoresis and appetite change.  Respiratory: Positive for shortness of breath. Negative for cough and chest tightness.   Cardiovascular: Positive for leg swelling. Negative for chest pain and palpitations.  Musculoskeletal: Positive for myalgias, back pain, joint swelling, arthralgias and gait problem.  Skin: Negative for rash.  Neurological: Positive for headaches. Negative for dizziness, syncope and light-headedness.  Hematological:  Negative for adenopathy.  Psychiatric/Behavioral: Positive for disturbed wake/sleep cycle and dysphoric mood. Negative for self-injury and agitation. The patient is nervous/anxious. The patient is not hyperactive.        Past Medical History  Diagnosis Date  . Hypertension   . Cholesterol serum increased   . Thyroid disease   . Sleep apnea   . Migraine   . Depression   . Neuromuscular disorder    BP 130/68  Pulse 122  Temp 98.3 F (36.8 C) (Oral)  Resp 16  Ht 5' 2.5" (1.588 m)  Wt 210 lb 3.2 oz (95.346 kg)  BMI 37.83 kg/m2  SpO2 93%  Objective:   Physical Exam  Constitutional: She is oriented to person, place, and time. She appears well-developed and well-nourished. No distress.  HENT:  Head: Normocephalic and atraumatic.  Right Ear: External ear normal.  Left Ear: External ear normal.  Eyes: Conjunctivae normal are normal. No scleral icterus.  Neck: Normal range of motion. Neck supple. No thyromegaly present.  Cardiovascular: Normal rate, regular rhythm, normal heart sounds and intact distal pulses.        1+ edema to upper calf bilaterally, none in sacrum  Pulmonary/Chest: Effort normal and breath sounds normal. No respiratory distress. She has no rales.  Musculoskeletal: She exhibits edema.  Lymphadenopathy:    She has no cervical adenopathy.  Neurological: She is alert and oriented to person, place, and time.  Skin: Skin is warm and dry. No rash noted.  She is not diaphoretic. No erythema. No pallor.  Psychiatric: She has a normal mood and affect. Her behavior is normal.          Assessment & Plan:  1. New onset lower ext edema, orthopnea, DOE - recheck bnp, check echo. 2. Anxiety - Cont citalopram 3. Insomnia - d/c xanax and restart clonazepam.  D/c if experiencing any daytime fatigue. Cont amitriptyline 4. CRF - stable, suspect from h/o freq NSAID overuse. If kidney function ever worsens, rec [place].  Consider trial of acei if BP ever tolerates.

## 2012-02-19 LAB — BASIC METABOLIC PANEL
BUN: 16 mg/dL (ref 6–23)
CO2: 29 mEq/L (ref 19–32)
Chloride: 100 mEq/L (ref 96–112)
Glucose, Bld: 101 mg/dL — ABNORMAL HIGH (ref 70–99)
Potassium: 4.1 mEq/L (ref 3.5–5.3)

## 2012-02-23 NOTE — Progress Notes (Signed)
Reviewed and agree.

## 2012-02-28 ENCOUNTER — Ambulatory Visit (HOSPITAL_COMMUNITY): Payer: BC Managed Care – PPO | Attending: Internal Medicine | Admitting: Radiology

## 2012-02-28 DIAGNOSIS — I509 Heart failure, unspecified: Secondary | ICD-10-CM | POA: Insufficient documentation

## 2012-02-28 DIAGNOSIS — I059 Rheumatic mitral valve disease, unspecified: Secondary | ICD-10-CM | POA: Insufficient documentation

## 2012-02-28 DIAGNOSIS — N189 Chronic kidney disease, unspecified: Secondary | ICD-10-CM | POA: Insufficient documentation

## 2012-02-28 DIAGNOSIS — R609 Edema, unspecified: Secondary | ICD-10-CM

## 2012-02-28 DIAGNOSIS — I369 Nonrheumatic tricuspid valve disorder, unspecified: Secondary | ICD-10-CM | POA: Insufficient documentation

## 2012-02-28 DIAGNOSIS — R0600 Dyspnea, unspecified: Secondary | ICD-10-CM

## 2012-02-28 DIAGNOSIS — R0609 Other forms of dyspnea: Secondary | ICD-10-CM | POA: Insufficient documentation

## 2012-02-28 DIAGNOSIS — I10 Essential (primary) hypertension: Secondary | ICD-10-CM

## 2012-02-28 DIAGNOSIS — R0989 Other specified symptoms and signs involving the circulatory and respiratory systems: Secondary | ICD-10-CM | POA: Insufficient documentation

## 2012-02-28 DIAGNOSIS — I129 Hypertensive chronic kidney disease with stage 1 through stage 4 chronic kidney disease, or unspecified chronic kidney disease: Secondary | ICD-10-CM | POA: Insufficient documentation

## 2012-02-28 NOTE — Progress Notes (Signed)
Echocardiogram performed.  

## 2012-03-09 ENCOUNTER — Telehealth: Payer: Self-pay

## 2012-03-09 NOTE — Telephone Encounter (Signed)
The patient called to return call to clinical team leader.  Please return call at [phone].

## 2012-03-10 NOTE — Telephone Encounter (Signed)
Left message for her to call me back. 

## 2012-03-17 ENCOUNTER — Ambulatory Visit (INDEPENDENT_AMBULATORY_CARE_PROVIDER_SITE_OTHER): Payer: BC Managed Care – PPO | Admitting: Family Medicine

## 2012-03-17 VITALS — BP 120/78 | HR 93 | Temp 97.8°F | Resp 18 | Ht 63.0 in | Wt 198.0 lb

## 2012-03-17 DIAGNOSIS — I10 Essential (primary) hypertension: Secondary | ICD-10-CM

## 2012-03-17 DIAGNOSIS — E039 Hypothyroidism, unspecified: Secondary | ICD-10-CM

## 2012-03-17 DIAGNOSIS — R609 Edema, unspecified: Secondary | ICD-10-CM

## 2012-03-17 DIAGNOSIS — N390 Urinary tract infection, site not specified: Secondary | ICD-10-CM

## 2012-03-17 DIAGNOSIS — Z79899 Other long term (current) drug therapy: Secondary | ICD-10-CM

## 2012-03-17 DIAGNOSIS — I5030 Unspecified diastolic (congestive) heart failure: Secondary | ICD-10-CM

## 2012-03-17 DIAGNOSIS — N189 Chronic kidney disease, unspecified: Secondary | ICD-10-CM

## 2012-03-17 DIAGNOSIS — E785 Hyperlipidemia, unspecified: Secondary | ICD-10-CM

## 2012-03-17 HISTORY — DX: Unspecified diastolic (congestive) heart failure: I50.30

## 2012-03-17 MED ORDER — POTASSIUM CHLORIDE CRYS ER 20 MEQ PO TBCR: 40.0000 meq | EXTENDED_RELEASE_TABLET | Freq: Two times a day (BID) | ORAL | Status: DC

## 2012-03-17 MED ORDER — CLONAZEPAM 0.5 MG PO TABS: 0.5000 mg | ORAL_TABLET | Freq: Two times a day (BID) | ORAL | Status: DC | PRN

## 2012-03-17 MED ORDER — FUROSEMIDE 20 MG PO TABS: 20.0000 mg | ORAL_TABLET | Freq: Two times a day (BID) | ORAL | Status: DC

## 2012-03-17 NOTE — Progress Notes (Signed)
  Subjective:    Patient ID: Jennifer Davidson, female    DOB: February 16, 1956, 56 y.o.   MRN: [id number]  HPI  Pt is doing [facility] better.  Lower ext edema continues to improve. No further syncopal or presyncopal episodes.  Is doing MUCH better on the klonopin - has not needed the xanax at all and was sleeping better.  However, hasn't gotten any sleep in the past 3d - her husband is a Naval architect and was hospitalized for sev d for a kidney stone but was just released from the hosp and is on his way back home - will be home tomorrow then she will hopefully be able to stop worrying and get some sleep. Past Medical History  Diagnosis Date  . Hypertension   . Cholesterol serum increased   . Thyroid disease   . Sleep apnea   . Migraine   . Depression   . Neuromuscular disorder      Review of Systems  Constitutional: Positive for fatigue. Negative for fever, chills, diaphoresis and appetite change.  Respiratory: Negative for cough and shortness of breath.   Cardiovascular: Positive for leg swelling. Negative for chest pain and palpitations.  Genitourinary: Negative for decreased urine volume.  Musculoskeletal: Positive for myalgias, back pain, arthralgias and gait problem.  Neurological: Negative for dizziness, syncope and light-headedness.  Psychiatric/Behavioral: Positive for sleep disturbance and dysphoric mood. The patient is nervous/anxious.       BP 120/78  Pulse 93  Temp 97.8 F (36.6 C) (Oral)  Resp 18  Ht 5\' 3"  (1.6 m)  Wt 198 lb (89.812 kg)  BMI 35.07 kg/m2  SpO2 98% Objective:   Physical Exam  Constitutional: She is oriented to person, place, and time. She appears well-developed and well-nourished. No distress.  HENT:  Head: Normocephalic and atraumatic.  Right Ear: External ear normal.  Left Ear: External ear normal.  Eyes: Conjunctivae normal are normal. No scleral icterus.  Neck: Normal range of motion. Neck supple. No thyromegaly present.  Cardiovascular: Normal rate,  regular rhythm, normal heart sounds and intact distal pulses.   Pulmonary/Chest: Effort normal and breath sounds normal. No respiratory distress.  Musculoskeletal: She exhibits edema.  Lymphadenopathy:    She has no cervical adenopathy.  Neurological: She is alert and oriented to person, place, and time.  Skin: Skin is warm and dry. She is not diaphoretic. No erythema.  Psychiatric: She has a normal mood and affect. Her behavior is normal.      Assessment & Plan:   1. Hypothyroid    2. Hypertension    3. Hyperlipidemia    4. Encounter for long-term (current) use of other medications  Basic metabolic panel - requiring high dose potassium. Switch back to pill - accidentally got powder w/ last rx.  5. Edema    6. Chronic kidney disease  Basic metabolic panel - suspect from h/o prolonged nsaid overuse.  7. Diastolic heart failure  Refer to cardiology for an eval to ensure we have pt on appropriate medication regimen. This is likely the cause for her edema but we seem to have her on the correct diuretic dose now.  Likely from hx of OSA but now treated regularly w/ CPAP   8. Anxiety and insomnia - cont klonopin and celexa - much improved from xanax. If she is not taking klonopin during the day, fine to increase klonopin to 2 tabs (=1mg ) qhs prn during times of stress (like husband in the hosp!)

## 2012-03-18 LAB — BASIC METABOLIC PANEL
CO2: 25 mEq/L (ref 19–32)
Calcium: 9.5 mg/dL (ref 8.4–10.5)
Creat: 1.25 mg/dL — ABNORMAL HIGH (ref 0.50–1.10)
Glucose, Bld: 89 mg/dL (ref 70–99)

## 2012-04-07 ENCOUNTER — Encounter: Payer: Self-pay | Admitting: *Deleted

## 2012-04-07 ENCOUNTER — Encounter: Payer: Self-pay | Admitting: Cardiovascular Disease

## 2012-04-10 ENCOUNTER — Institutional Professional Consult (permissible substitution): Payer: BC Managed Care – PPO | Admitting: Cardiovascular Disease

## 2012-04-10 ENCOUNTER — Other Ambulatory Visit: Payer: Self-pay | Admitting: Family Medicine

## 2012-04-10 DIAGNOSIS — Z1231 Encounter for screening mammogram for malignant neoplasm of breast: Secondary | ICD-10-CM

## 2012-04-11 ENCOUNTER — Institutional Professional Consult (permissible substitution): Payer: BC Managed Care – PPO | Admitting: Cardiovascular Disease

## 2012-04-20 ENCOUNTER — Telehealth: Payer: Self-pay

## 2012-04-20 NOTE — Telephone Encounter (Signed)
PATIENT CALLED SAYING THAT HER ANTI-DEPRESSANT CLONOPIN HAS RAN OUT FOR FOUR DAYS. SHE CALLED [facility] AND THEY COULD NOT VERIFY WHEN IT WILL BE READY FOR REFILL. SHE WANTED TO KNOW IF HER DR. COULD CALL IT IN TO [facility] LOCAL [facility] [place]  (ONLY ONE IN TOWN). THANK YOU! PLEASE CALL PATIENT IF NEEDED.

## 2012-04-20 NOTE — Telephone Encounter (Signed)
Pt was given 60 tabs for bid klonopin 0.5mg  on 03/17/12 with 2 refills.  Is the [facility] really backed up? If so, that is fine to call in a 1 mo supply to her [facility] that she can use in the meantime but just wanted to make sure she hadn't used all 3 mos supply in 1 mo.  Thanks.

## 2012-04-21 ENCOUNTER — Telehealth: Payer: Self-pay

## 2012-04-21 MED ORDER — CITALOPRAM HYDROBROMIDE 40 MG PO TABS: 40.0000 mg | ORAL_TABLET | Freq: Every day | ORAL | Status: DC

## 2012-04-21 MED ORDER — CLONAZEPAM 0.5 MG PO TABS: 0.5000 mg | ORAL_TABLET | Freq: Two times a day (BID) | ORAL | Status: DC | PRN

## 2012-04-21 NOTE — Telephone Encounter (Signed)
Spoke w/pt to verify that she wants a 30 day supply of Celexa sent to [facility] local [facility]. Asked pt about RFs at [facility] and she stated that they only sent a 30 day supply and she hasn't been able to get them to send a 90 day supply. I advised pt that I can send in a new Rx to [facility] for 90 day supplies that should straighten that out and a 30 day to [facility] to take now. Pt thanked me and I sent in both Rxs.

## 2012-04-21 NOTE — Telephone Encounter (Signed)
Pt requested the wrong medication yesterday.  She is out of the citalopram (CELEXA) 40 MG tablet  Has been out now for seven days.  [facility]  Phone [phone]

## 2012-04-21 NOTE — Telephone Encounter (Signed)
Spoke to patient. She states she is not  Sure when she will get the Rx, sent in a 1 mo supply for her.

## 2012-04-25 ENCOUNTER — Encounter: Payer: Self-pay | Admitting: Cardiovascular Disease

## 2012-04-25 ENCOUNTER — Ambulatory Visit (INDEPENDENT_AMBULATORY_CARE_PROVIDER_SITE_OTHER): Payer: BC Managed Care – PPO | Admitting: Cardiovascular Disease

## 2012-04-25 VITALS — BP 122/90 | HR 127 | Resp 16 | Ht 63.0 in | Wt 194.2 lb

## 2012-04-25 DIAGNOSIS — F341 Dysthymic disorder: Secondary | ICD-10-CM

## 2012-04-25 DIAGNOSIS — R000 Tachycardia, unspecified: Secondary | ICD-10-CM

## 2012-04-25 DIAGNOSIS — I10 Essential (primary) hypertension: Secondary | ICD-10-CM

## 2012-04-25 DIAGNOSIS — I5030 Unspecified diastolic (congestive) heart failure: Secondary | ICD-10-CM

## 2012-04-25 DIAGNOSIS — F329 Major depressive disorder, single episode, unspecified: Secondary | ICD-10-CM

## 2012-04-25 NOTE — Assessment & Plan Note (Signed)
Not clinically relavant with no dyspnea and LE edema dependant and more likely from meds and LE venous disease

## 2012-04-25 NOTE — Assessment & Plan Note (Signed)
Well controlled.  Continue current medications and low sodium Dash type diet.    

## 2012-04-25 NOTE — Patient Instructions (Addendum)
Your physician recommends that you schedule a follow-up appointment in: AS NEEDED  Your physician recommends that you continue on your current medications as directed. Please refer to the Current Medication list given to you today.  

## 2012-04-25 NOTE — Assessment & Plan Note (Signed)
Relative secondary to meds.  NO evidence of low stroke volume.  Hct 34 and TSH 4 in September.

## 2012-04-25 NOTE — Progress Notes (Signed)
Patient ID: Jennifer Davidson, female   DOB: [birth year], 56 y.o.   MRN: [id number] 56 yo referred for edema and dizzyness.  She has been on a 3 year odyssey since having back surgery with Dr Newell Coral 3 years ago.  She has residual disc disease and has been in pain management with injections and an extensive med list.  LE edema improved on lasix.  Dizzyness not postural.  NO syncope No palpitations.  Relative tachycardia. No dyspnea or chest pain with activity limited by back pain.  She is on 6-7 meds for pain and neuropathy all of which can cause dizzyness.  Grade two diastolic dysfunction not clinically relavent in this situation and should not be causing edema.  She needs to see Dr Newell Coral in f/u and either have surgery on residual disease or get off some of these medications.    Echo done 10/13 Study Conclusions  - Left ventricle: The cavity size was normal. Wall thickness was normal. Systolic function was normal. The estimated ejection fraction was in the range of 55% to 60%. Wall motion was normal; there were no regional wall motion abnormalities. Features are consistent with a pseudonormal left ventricular filling pattern, with concomitant abnormal relaxation and increased filling pressure (grade 2 diastolic dysfunction). - Mitral valve: Mild regurgitation.   ROS: Denies fever, malais, weight loss, blurry vision, decreased visual acuity, cough, sputum, SOB, hemoptysis, pleuritic pain, palpitaitons, heartburn, abdominal pain, melena, lower extremity edema, claudication, or rash.  All other systems reviewed and negative   General: Affect appropriate Healthy:  appears stated age HEENT: normal Neck supple with no adenopathy JVP normal no bruits no thyromegaly Lungs clear with no wheezing and good diaphragmatic motion Heart:  S1/S2 no murmur,rub, gallop or click PMI normal Abdomen: benighn, BS positve, no tenderness, no AAA no bruit.  No HSM or HJR Distal pulses intact with no bruits Trace  LE  edema Neuro non-focal Skin warm and dry No muscular weakness  Medications Current Outpatient Prescriptions  Medication Sig Dispense Refill  . amitriptyline (ELAVIL) 10 MG tablet Take 10 mg by mouth at bedtime.       . citalopram (CELEXA) 40 MG tablet Take 1 tablet (40 mg total) by mouth daily.  90 tablet  1  . clonazePAM (KLONOPIN) 0.5 MG tablet Take 1 tablet (0.5 mg total) by mouth 2 (two) times daily as needed for anxiety.  60 tablet  0  . fentaNYL (DURAGESIC - DOSED MCG/HR) 25 MCG/HR Place 1 patch onto the skin every 3 (three) days.      . furosemide (LASIX) 20 MG tablet Take 1 tablet (20 mg total) by mouth 2 (two) times daily.  60 tablet  2  . gabapentin (NEURONTIN) 300 MG capsule Take 300 mg by mouth 3 (three) times daily.       Marland Kitchen levothyroxine (SYNTHROID, LEVOTHROID) 25 MCG tablet Take 25 mcg by mouth daily.       . potassium chloride SA (K-DUR,KLOR-CON) 20 MEQ tablet Take 2 tablets (40 mEq total) by mouth 2 (two) times daily.  120 tablet  2  . rosuvastatin (CRESTOR) 20 MG tablet Take 20 mg by mouth daily.       Marland Kitchen topiramate (TOPAMAX) 100 MG tablet Take 100 mg by mouth daily.       . buprenorphine (BUTRANS) 10 MCG/HR PTWK Place 10 mcg onto the skin once a week. Applies on Thursday      . buprenorphine (BUTRANS) 5 MCG/HR PTWK Place 5 mcg onto the skin once a  week. Applies on Friday        Allergies Review of patient's allergies indicates no known allergies.  Family History: Family History  Problem Relation Age of Onset  . Diabetes Mother   . Heart disease Mother   . Kidney disease Mother   . Thyroid disease Mother   . Heart disease Father   . Seizures Father   . COPD Father   . Cancer Maternal Grandmother     Social History: History   Social History  . Marital Status: Married    Spouse Name: N/A    Number of Children: N/A  . Years of Education: N/A   Occupational History  . Not on file.   Social History Main Topics  . Smoking status: Never Smoker   .  Smokeless tobacco: Never Used  . Alcohol Use: No  . Drug Use: Not on file  . Sexually Active: Yes -- Female partner(s)     Comment: married   Other Topics Concern  . Not on file   Social History Narrative  . No narrative on file    Electrocardiogram:  02/14/12  ST rate 105 normal and normal QT less than 400  Assessment and Plan

## 2012-04-25 NOTE — Assessment & Plan Note (Signed)
Again I think all of her issues are med and lifestyle driven.  Needs to simplify meds  Or possibly have repeat back surgery.

## 2012-04-27 ENCOUNTER — Ambulatory Visit (HOSPITAL_COMMUNITY)
Admission: RE | Admit: 2012-04-27 | Discharge: 2012-04-27 | Disposition: A | Discharge status: Home / Self Care | Payer: BC Managed Care – PPO | Source: Ambulatory Visit | Attending: Family Medicine | Admitting: Family Medicine

## 2012-04-27 DIAGNOSIS — Z1231 Encounter for screening mammogram for malignant neoplasm of breast: Secondary | ICD-10-CM | POA: Insufficient documentation

## 2012-05-11 ENCOUNTER — Other Ambulatory Visit: Payer: Self-pay | Admitting: Family Medicine

## 2012-05-23 ENCOUNTER — Other Ambulatory Visit: Payer: Self-pay | Admitting: *Deleted

## 2012-05-23 MED ORDER — FUROSEMIDE 20 MG PO TABS: 40.0000 mg | ORAL_TABLET | Freq: Two times a day (BID) | ORAL | Status: DC

## 2012-06-08 ENCOUNTER — Other Ambulatory Visit: Payer: Self-pay | Admitting: Family Medicine

## 2012-06-10 ENCOUNTER — Other Ambulatory Visit: Payer: Self-pay | Admitting: Family Medicine

## 2012-06-23 ENCOUNTER — Ambulatory Visit (INDEPENDENT_AMBULATORY_CARE_PROVIDER_SITE_OTHER): Payer: BC Managed Care – PPO | Admitting: Family Medicine

## 2012-06-23 ENCOUNTER — Encounter: Payer: Self-pay | Admitting: Family Medicine

## 2012-06-23 VITALS — BP 134/80 | HR 95 | Temp 98.5°F | Resp 16 | Ht 63.0 in | Wt 196.2 lb

## 2012-06-23 DIAGNOSIS — R000 Tachycardia, unspecified: Secondary | ICD-10-CM

## 2012-06-23 DIAGNOSIS — R251 Tremor, unspecified: Secondary | ICD-10-CM

## 2012-06-23 DIAGNOSIS — F419 Anxiety disorder, unspecified: Secondary | ICD-10-CM

## 2012-06-23 DIAGNOSIS — N189 Chronic kidney disease, unspecified: Secondary | ICD-10-CM

## 2012-06-23 DIAGNOSIS — R609 Edema, unspecified: Secondary | ICD-10-CM

## 2012-06-23 DIAGNOSIS — I10 Essential (primary) hypertension: Secondary | ICD-10-CM

## 2012-06-23 DIAGNOSIS — G43909 Migraine, unspecified, not intractable, without status migrainosus: Secondary | ICD-10-CM

## 2012-06-23 DIAGNOSIS — Z79899 Other long term (current) drug therapy: Secondary | ICD-10-CM

## 2012-06-23 DIAGNOSIS — E039 Hypothyroidism, unspecified: Secondary | ICD-10-CM

## 2012-06-23 DIAGNOSIS — J069 Acute upper respiratory infection, unspecified: Secondary | ICD-10-CM

## 2012-06-23 DIAGNOSIS — E785 Hyperlipidemia, unspecified: Secondary | ICD-10-CM

## 2012-06-23 LAB — COMPREHENSIVE METABOLIC PANEL
ALT: 24 U/L (ref 0–35)
Albumin: 4.2 g/dL (ref 3.5–5.2)
Alkaline Phosphatase: 73 U/L (ref 39–117)
Potassium: 3.7 mEq/L (ref 3.5–5.3)
Sodium: 134 mEq/L — ABNORMAL LOW (ref 135–145)
Total Bilirubin: 0.5 mg/dL (ref 0.3–1.2)
Total Protein: 7.1 g/dL (ref 6.0–8.3)

## 2012-06-23 LAB — LIPID PANEL
HDL: 36 mg/dL — ABNORMAL LOW (ref >39)
Total CHOL/HDL Ratio: 4.5 Ratio

## 2012-06-23 MED ORDER — POTASSIUM CHLORIDE CRYS ER 20 MEQ PO TBCR: 40.0000 meq | EXTENDED_RELEASE_TABLET | Freq: Two times a day (BID) | ORAL | Status: DC

## 2012-06-23 MED ORDER — FUROSEMIDE 40 MG PO TABS: 40.0000 mg | ORAL_TABLET | Freq: Two times a day (BID) | ORAL | Status: DC

## 2012-06-23 MED ORDER — AZELASTINE HCL 0.1 % NA SOLN: 2.0000 | Freq: Two times a day (BID) | NASAL | Status: DC

## 2012-06-23 MED ORDER — PROPRANOLOL HCL ER 120 MG PO CP24: 120.0000 mg | ORAL_CAPSULE | Freq: Every day | ORAL | Status: DC

## 2012-06-23 MED ORDER — IPRATROPIUM BROMIDE 0.03 % NA SOLN: 2.0000 | Freq: Four times a day (QID) | NASAL | Status: DC

## 2012-06-23 MED ORDER — FLUTICASONE PROPIONATE 50 MCG/ACT NA SUSP: 2.0000 | Freq: Every day | NASAL | Status: DC

## 2012-06-23 MED ORDER — SUMATRIPTAN SUCCINATE 50 MG PO TABS: 50.0000 mg | ORAL_TABLET | ORAL | Status: DC | PRN

## 2012-06-23 NOTE — Progress Notes (Signed)
Subjective:    Patient ID: Jennifer Davidson, female    DOB: [birth year], 57 y.o.   MRN: [id number] Chief Complaint  Patient presents with  . Hyperlipidemia    HPI  Jennifer Davidson is doing all right - about the same. She has occasional days where she will develop severe lower extremity edema and she will try to keep her feet up and really watch her diet and it will go down.  She is not eating any salt at all - her husband and daughter keep her on the straight and narrow about this. Her mood is improved. She is only having about 1 panic attack a mo. She is having several migraines/mo. The topamax is doing a good job of preventing them but she doesn't know what to take when she does have them - is not supposed to use any otc meds due to kidneys and liver. nsaids are completely contraind since we suspect that is the cause of her kidney failure. She has a tremor - it comes on intermittently, will develop at rest or while she is doing things, does not go away with relaxing. It is only in her hands- they twitch and cause her to drop a lot of things - she has to use cups w/ a lid or she will spill. She has discussed this w/ her neurosugeon and her pain management doctor who state that this is not due to her back. She was ill last wk with cold and cough sxs. Right ear hurting very bad. She has been using benadryl and flonase to treat her sxs. She is fasting today. Dizzy episodes continue - knows that this is likely due to pain and anxiety meds and pain management doctor also aware.   Review of Systems    BP 134/80  Pulse 95  Temp 98.5 F (36.9 C) (Oral)  Resp 16  Ht 5\' 3"  (1.6 m)  Wt 196 lb 3.2 oz (88.996 kg)  BMI 34.76 kg/m2  SpO2 98% Objective:   Physical Exam        Assessment & Plan:   1. Tremor  propranolol ER (INDERAL LA) 120 MG 24 hr capsule.  Unknown causes - very odd - more myoclonic twitching than tremor. Will do trial of propranolol (  2. Tachycardia  Hopefully propranolol will help  3.  Hypothyroid  TSH - recheck. If tsh is still at 4, rec increasing levothyroxine from 25 to 50  4. Hypertension    5. Hyperlipidemia  Lipid panel  6. Encounter for long-term (current) use of other medications  Comprehensive metabolic panel  7. Edema  furosemide (LASIX) 40 MG tablet bid, potassium chloride SA (KLOR-CON M20) r0 MEQ tablet bid.  Cont high potassium diet.  8. Chronic kidney disease  Recheck. If worsens, refer to nephrology  9. Anxiety and depression  Cont celexa 40 and klonopin 0.5 bid. Hopefully propranolol will also help anxiety  10. Migraine  SUMAtriptan (IMITREX) 50 MG tablet. Cont topamax and hopefully propranolol will also help prevent migraines but try abortive imitrex for breakthrough migraines since nsaids contraindicated.  11. URI (upper respiratory infection)  azelastine (ASTELIN) 137 MCG/SPRAY nasal spray, fluticasone (FLONASE) 50 MCG/ACT nasal spray, ipratropium (ATROVENT) 0.03 % nasal spray. Try to avoid course of prednisone. Do hot showers, steam, and try netti pot or sinus rinse.   Meds ordered this encounter  Medications  . propranolol ER (INDERAL LA) 120 MG 24 hr capsule    Sig: Take 1 capsule (120 mg total) by mouth daily.  Dispense:  30 capsule    Refill:  3  . SUMAtriptan (IMITREX) 50 MG tablet    Sig: Take 1 tablet (50 mg total) by mouth every 2 (two) hours as needed for migraine.    Dispense:  10 tablet    Refill:  3  . furosemide (LASIX) 40 MG tablet    Sig: Take 1 tablet (40 mg total) by mouth 2 (two) times daily.    Dispense:  180 tablet    Refill:  0  . potassium chloride SA (KLOR-CON M20) 20 MEQ tablet    Sig: Take 2 tablets (40 mEq total) by mouth 2 (two) times daily. Needs office visit/labs    Dispense:  360 tablet    Refill:  0  . azelastine (ASTELIN) 137 MCG/SPRAY nasal spray    Sig: Place 2 sprays into the nose 2 (two) times daily. Use in each nostril as directed    Dispense:  30 mL    Refill:  3  . fluticasone (FLONASE) 50 MCG/ACT nasal  spray    Sig: Place 2 sprays into the nose at bedtime.    Dispense:  16 g    Refill:  6  . ipratropium (ATROVENT) 0.03 % nasal spray    Sig: Place 2 sprays into the nose 4 (four) times daily.    Dispense:  30 mL    Refill:  2

## 2012-06-23 NOTE — Patient Instructions (Addendum)
Hot showers or breathing in steam may help loosen the congestion.  Using a netti pot or sinus rinse is also likely to help you feel better and keep this from progressing.  Use the atrovent nasal spray as needed throughout the day and use the fluticasone nasal spray every night before bed for at least 2 weeks.  I recommend augmenting with generic mucinex to help you move out the congestion.  If no improvement or you are getting worse, come back as you might need a course of steroids but hopefully with all of the above, you can avoid it.   Foods Rich in Potassium Food / Potassium (mg)  Apricots, dried,  cup / 378 mg   Apricots, raw, 1 cup halves / 401 mg   Avocado,  / 487 mg   Banana, 1 large / 487 mg   Beef, lean, round, 3 oz / 202 mg   Cantaloupe, 1 cup cubes / 427 mg   Dates, medjool, 5 whole / 835 mg   Ham, cured, 3 oz / 212 mg   Lentils, dried,  cup / 458 mg   Lima beans, frozen,  cup / 258 mg   Orange, 1 large / 333 mg   Orange juice, 1 cup / 443 mg   Peaches, dried,  cup / 398 mg   Peas, split, cooked,  cup / 355 mg   Potato, boiled, 1 medium / 515 mg   Prunes, dried, uncooked,  cup / 318 mg   Raisins,  cup / 309 mg   Salmon, pink, raw, 3 oz / 275 mg   Sardines, canned , 3 oz / 338 mg   Tomato, raw, 1 medium / 292 mg   Tomato juice, 6 oz / 417 mg   [place], 3 oz / 349 mg  Document Released: 05/03/2005 Document Revised: 01/13/2011 Document Reviewed: 09/16/2008 [facility] Patient Information 2012 [place], [place].

## 2012-06-24 MED ORDER — FENOFIBRATE 145 MG PO TABS: 145.0000 mg | ORAL_TABLET | Freq: Every day | ORAL | Status: DC

## 2012-06-24 MED ORDER — ROSUVASTATIN CALCIUM 20 MG PO TABS: 20.0000 mg | ORAL_TABLET | Freq: Every day | ORAL | Status: DC

## 2012-06-24 MED ORDER — LEVOTHYROXINE SODIUM 50 MCG PO TABS: 50.0000 ug | ORAL_TABLET | Freq: Every day | ORAL | Status: DC

## 2012-09-19 ENCOUNTER — Ambulatory Visit: Payer: BC Managed Care – PPO

## 2012-09-21 ENCOUNTER — Ambulatory Visit: Payer: BC Managed Care – PPO | Admitting: Physical Therapy

## 2012-09-22 ENCOUNTER — Encounter: Payer: Self-pay | Admitting: Family Medicine

## 2012-09-22 ENCOUNTER — Ambulatory Visit (INDEPENDENT_AMBULATORY_CARE_PROVIDER_SITE_OTHER): Payer: BC Managed Care – PPO | Admitting: Family Medicine

## 2012-09-22 VITALS — BP 110/74 | HR 72 | Temp 97.7°F | Resp 16 | Ht 62.5 in | Wt 186.6 lb

## 2012-09-22 DIAGNOSIS — N189 Chronic kidney disease, unspecified: Secondary | ICD-10-CM

## 2012-09-22 DIAGNOSIS — E785 Hyperlipidemia, unspecified: Secondary | ICD-10-CM

## 2012-09-22 DIAGNOSIS — I10 Essential (primary) hypertension: Secondary | ICD-10-CM

## 2012-09-22 DIAGNOSIS — R251 Tremor, unspecified: Secondary | ICD-10-CM

## 2012-09-22 DIAGNOSIS — F341 Dysthymic disorder: Secondary | ICD-10-CM

## 2012-09-22 DIAGNOSIS — R259 Unspecified abnormal involuntary movements: Secondary | ICD-10-CM

## 2012-09-22 DIAGNOSIS — F329 Major depressive disorder, single episode, unspecified: Secondary | ICD-10-CM

## 2012-09-22 DIAGNOSIS — R609 Edema, unspecified: Secondary | ICD-10-CM

## 2012-09-22 DIAGNOSIS — E039 Hypothyroidism, unspecified: Secondary | ICD-10-CM

## 2012-09-22 DIAGNOSIS — Z79899 Other long term (current) drug therapy: Secondary | ICD-10-CM

## 2012-09-22 LAB — CBC WITH DIFFERENTIAL/PLATELET
Eosinophils Relative: 1 % (ref 0–5)
HCT: 37 % (ref 36.0–46.0)
Lymphocytes Relative: 28 % (ref 12–46)
Lymphs Abs: 2.6 10*3/uL (ref 0.7–4.0)
MCV: 81.7 fL (ref 78.0–100.0)
Monocytes Absolute: 0.7 10*3/uL (ref 0.1–1.0)
Platelets: 308 10*3/uL (ref 150–400)
RBC: 4.53 MIL/uL (ref 3.87–5.11)
WBC: 9.1 10*3/uL (ref 4.0–10.5)

## 2012-09-22 LAB — LIPID PANEL
Cholesterol: 152 mg/dL (ref 0–200)
HDL: 67 mg/dL (ref >39)
LDL Cholesterol: 63 mg/dL (ref 0–99)
Total CHOL/HDL Ratio: 2.3 ratio
Triglycerides: 111 mg/dL (ref <150)
VLDL: 22 mg/dL (ref 0–40)

## 2012-09-22 LAB — COMPREHENSIVE METABOLIC PANEL WITH GFR
ALT: 58 U/L — ABNORMAL HIGH (ref 0–35)
AST: 42 U/L — ABNORMAL HIGH (ref 0–37)
Albumin: 4.4 g/dL (ref 3.5–5.2)
Alkaline Phosphatase: 41 U/L (ref 39–117)
BUN: 24 mg/dL — ABNORMAL HIGH (ref 6–23)
CO2: 28 meq/L (ref 19–32)
Calcium: 10.6 mg/dL — ABNORMAL HIGH (ref 8.4–10.5)
Chloride: 99 meq/L (ref 96–112)
Creat: 1.67 mg/dL — ABNORMAL HIGH (ref 0.50–1.10)
Glucose, Bld: 92 mg/dL (ref 70–99)
Potassium: 3.9 meq/L (ref 3.5–5.3)
Sodium: 137 meq/L (ref 135–145)
Total Bilirubin: 0.5 mg/dL (ref 0.3–1.2)
Total Protein: 7 g/dL (ref 6.0–8.3)

## 2012-09-22 LAB — TSH: TSH: 1.211 u[IU]/mL (ref 0.350–4.500)

## 2012-09-22 MED ORDER — PROPRANOLOL HCL ER 120 MG PO CP24: 120.0000 mg | ORAL_CAPSULE | Freq: Every day | ORAL | Status: DC

## 2012-09-22 MED ORDER — CLONAZEPAM 0.5 MG PO TABS: ORAL_TABLET | ORAL | Status: DC

## 2012-09-22 NOTE — Progress Notes (Signed)
  Subjective:    Patient ID: Jennifer Davidson, female    DOB: Dec 19, 1955, 57 y.o.   MRN: [id number]  HPI  Her tremor has gotten worse - starts suddenly with no warning.  She is here with her sister Arline Asp.  In both hands - will be either left, right, or both.  Saw pain management dr Shona Needles as her right leg - the whole side - gave away and she fell.  She is trying to refer her to a physician in [place] - likely a neurosurgeon - but it is a worker's comp case so she is dealing with.  Dr. Newell Coral - who did work on back - has been holding her paperwork - he did her prior back surg through worker's comp but her pain dr wants a second opinion. Has not seen neurologist  Propranolol did not help her tremors at all.  Will happen at least twice a day - may last all day up to 1/2 d - varies from 10 min to 2-3 hrs.  She will drop her utensils, glass, it doesn't matter.  Just hands or arms but then sometimes right leg will give away.  Very random. Pain is worse now than it was in the beginning. She doesn't want to take all these medicines.   Using tens unit - going to [facility] on [address] - has to go 3-4x/wk. Needs transportation.   By herself -   Feels like her life is ruined.  Lives in [place].  Dr. Manon Hilding at [facility] is going to do a branch block again.  Review of Systems    BP 110/74  Pulse 72  Temp(Src) 97.7 F (36.5 C) (Oral)  Resp 16  Ht 5' 2.5" (1.588 m)  Wt 186 lb 9.6 oz (84.641 kg)  BMI 33.56 kg/m2  SpO2 94% Objective:   Physical Exam        Assessment & Plan:  Refer to neurology.

## 2012-09-26 ENCOUNTER — Ambulatory Visit: Payer: PRIVATE HEALTH INSURANCE | Attending: Physical Medicine and Rehabilitation | Admitting: Physical Therapy

## 2012-09-26 DIAGNOSIS — M6281 Muscle weakness (generalized): Secondary | ICD-10-CM | POA: Insufficient documentation

## 2012-09-26 DIAGNOSIS — M545 Low back pain, unspecified: Secondary | ICD-10-CM | POA: Insufficient documentation

## 2012-09-26 DIAGNOSIS — M25559 Pain in unspecified hip: Secondary | ICD-10-CM | POA: Insufficient documentation

## 2012-09-26 DIAGNOSIS — IMO0001 Reserved for inherently not codable concepts without codable children: Secondary | ICD-10-CM | POA: Insufficient documentation

## 2012-09-26 DIAGNOSIS — M25659 Stiffness of unspecified hip, not elsewhere classified: Secondary | ICD-10-CM | POA: Insufficient documentation

## 2012-10-03 ENCOUNTER — Ambulatory Visit: Payer: PRIVATE HEALTH INSURANCE | Admitting: Rehabilitation

## 2012-10-05 ENCOUNTER — Ambulatory Visit: Payer: PRIVATE HEALTH INSURANCE | Attending: Family Medicine | Admitting: Rehabilitation

## 2012-10-05 DIAGNOSIS — IMO0001 Reserved for inherently not codable concepts without codable children: Secondary | ICD-10-CM | POA: Insufficient documentation

## 2012-10-05 DIAGNOSIS — M25659 Stiffness of unspecified hip, not elsewhere classified: Secondary | ICD-10-CM | POA: Insufficient documentation

## 2012-10-05 DIAGNOSIS — M545 Low back pain, unspecified: Secondary | ICD-10-CM | POA: Insufficient documentation

## 2012-10-05 DIAGNOSIS — M6281 Muscle weakness (generalized): Secondary | ICD-10-CM | POA: Insufficient documentation

## 2012-10-05 DIAGNOSIS — M25559 Pain in unspecified hip: Secondary | ICD-10-CM | POA: Insufficient documentation

## 2012-10-09 ENCOUNTER — Other Ambulatory Visit: Payer: Self-pay | Admitting: Family Medicine

## 2012-10-10 ENCOUNTER — Ambulatory Visit: Payer: PRIVATE HEALTH INSURANCE | Admitting: Rehabilitation

## 2012-10-12 ENCOUNTER — Ambulatory Visit: Payer: PRIVATE HEALTH INSURANCE | Admitting: Rehabilitation

## 2012-10-17 ENCOUNTER — Encounter: Payer: Self-pay | Admitting: Physical Therapy

## 2012-10-18 ENCOUNTER — Encounter: Payer: Self-pay | Admitting: Rehabilitation

## 2012-10-24 ENCOUNTER — Ambulatory Visit: Payer: BC Managed Care – PPO | Attending: Family Medicine | Admitting: Rehabilitation

## 2012-10-24 DIAGNOSIS — M25559 Pain in unspecified hip: Secondary | ICD-10-CM | POA: Insufficient documentation

## 2012-10-24 DIAGNOSIS — M545 Low back pain, unspecified: Secondary | ICD-10-CM | POA: Insufficient documentation

## 2012-10-24 DIAGNOSIS — M6281 Muscle weakness (generalized): Secondary | ICD-10-CM | POA: Insufficient documentation

## 2012-10-24 DIAGNOSIS — IMO0001 Reserved for inherently not codable concepts without codable children: Secondary | ICD-10-CM | POA: Insufficient documentation

## 2012-10-24 DIAGNOSIS — M25659 Stiffness of unspecified hip, not elsewhere classified: Secondary | ICD-10-CM | POA: Insufficient documentation

## 2012-10-27 ENCOUNTER — Encounter: Payer: Self-pay | Admitting: Rehabilitation

## 2012-10-28 ENCOUNTER — Other Ambulatory Visit: Payer: Self-pay | Admitting: Family Medicine

## 2012-10-30 ENCOUNTER — Ambulatory Visit: Payer: BC Managed Care – PPO | Admitting: Rehabilitation

## 2012-11-01 ENCOUNTER — Encounter: Payer: Self-pay | Admitting: *Deleted

## 2012-11-01 ENCOUNTER — Ambulatory Visit: Payer: Self-pay | Admitting: Family Medicine

## 2012-11-01 ENCOUNTER — Encounter: Payer: Self-pay | Admitting: Rehabilitation

## 2012-11-01 ENCOUNTER — Other Ambulatory Visit: Payer: Self-pay | Admitting: Family Medicine

## 2012-11-01 VITALS — BP 100/72 | HR 88 | Temp 98.1°F | Resp 18 | Wt 182.0 lb

## 2012-11-01 DIAGNOSIS — R1110 Vomiting, unspecified: Secondary | ICD-10-CM

## 2012-11-01 DIAGNOSIS — R197 Diarrhea, unspecified: Secondary | ICD-10-CM

## 2012-11-01 LAB — POCT CBC
Lymph, poc: 2.9 (ref 0.6–3.4)
MCH, POC: 27.8 pg (ref 27–31.2)
MCHC: 30.9 g/dL — AB (ref 31.8–35.4)
MCV: 90.2 fL (ref 80–97)
MID (cbc): 0.8 (ref 0–0.9)
MPV: 8.5 fL (ref 0–99.8)
POC LYMPH PERCENT: 25.7 %L (ref 10–50)
POC MID %: 6.9 %M (ref 0–12)
Platelet Count, POC: 353 10*3/uL (ref 142–424)
RBC: 4.96 M/uL (ref 4.04–5.48)
RDW, POC: 16 %
WBC: 11.3 10*3/uL — AB (ref 4.6–10.2)

## 2012-11-01 LAB — POCT URINALYSIS DIPSTICK
Bilirubin, UA: NEGATIVE
Blood, UA: NEGATIVE
Glucose, UA: NEGATIVE
Nitrite, UA: NEGATIVE

## 2012-11-01 LAB — COMPREHENSIVE METABOLIC PANEL
ALT: 65 U/L — ABNORMAL HIGH (ref 0–35)
AST: 71 U/L — ABNORMAL HIGH (ref 0–37)
Alkaline Phosphatase: 45 U/L (ref 39–117)
BUN: 20 mg/dL (ref 6–23)
Creat: 1.63 mg/dL — ABNORMAL HIGH (ref 0.50–1.10)
Total Bilirubin: 0.6 mg/dL (ref 0.3–1.2)

## 2012-11-01 LAB — POCT UA - MICROSCOPIC ONLY: Crystals, Ur, HPF, POC: NEGATIVE

## 2012-11-01 MED ORDER — DICYCLOMINE HCL 10 MG PO CAPS: 10.0000 mg | ORAL_CAPSULE | Freq: Three times a day (TID) | ORAL | Status: DC

## 2012-11-01 MED ORDER — ONDANSETRON 4 MG PO TBDP: 4.0000 mg | ORAL_TABLET | Freq: Three times a day (TID) | ORAL | Status: DC | PRN

## 2012-11-01 NOTE — Patient Instructions (Addendum)
Drink plenty of fluids  Take Bentyl 10 mg 1 pill 4 times daily to slow your bowels.  Take ondansetron 4 mg every 6 or 8 hours only when needed for nausea or vomiting  Return if worse or not improving  In the event of more acute pain, diarrhea, vomiting, fever, or other items of concern return sooner or go to the emergency room

## 2012-11-01 NOTE — Progress Notes (Signed)
Subjective: 57 year old lady who has struggled last couple of years with a bad back from a workers comp injury. 3 weeks ago she began having diarrhea. It is been going on multiple times a day. The stools of almost consistently been loose the she's had a couple of formed bowel movements. The but has gotten raw from the diarrhea, but they have been working on that is doing better. She is loose enough that she fears accident since she's had wear depends at times. She gets cramping pain in the low abdomen. Not any urinary problems. Yesterday she started having vomiting. She has had a sprain today, nothing else. She has been trying to lose weight, and has lost steadily over the past year. She lost 4 pounds since last visit, which is about the same pain she had been losing prior to being sick.  When this initially started she was told by the people at the physical therapy that she goes to the another patient and had the same, and they thought this was from a diarrhea about going to the facility. However hers is continued to persist.  Objective: Pleasant lady not appearing terribly ill, though she doesn't feel well. She hurts a lot when she moves. Her throat is clear. Neck supple without nodes. Chest clear to auscultation. Heart regular without murmurs. Abdomen has active bowel sounds. Soft without chemical masses or major tenderness. She has muscular pains across lower abdomen. Skin turgor is good.  Assessment: Diarrhea Vomiting History of his disease  Plan: Labs  Results for orders placed in visit on 11/01/12  POCT URINALYSIS DIPSTICK      Result Value Range   Color, UA yellow     Clarity, UA clear     Glucose, UA neg     Bilirubin, UA neg     Ketones, UA neg     Spec Grav, UA 1.015     Blood, UA neg     pH, UA 6.5     Protein, UA neg     Urobilinogen, UA 1.0     Nitrite, UA neg     Leukocytes, UA Negative    POCT UA - MICROSCOPIC ONLY      Result Value Range   WBC, Ur, HPF, POC 0-3     RBC, urine, microscopic 0-1     Bacteria, U Microscopic 1+     Mucus, UA trace     Epithelial cells, urine per micros 4-16     Crystals, Ur, HPF, POC neg     Casts, Ur, LPF, POC neg     Yeast, UA neg     Renal tubular cells pos    POCT CBC      Result Value Range   WBC 11.3 (*) 4.6 - 10.2 K/uL   Lymph, poc 2.9  0.6 - 3.4   POC LYMPH PERCENT 25.7  10 - 50 %L   MID (cbc) 0.8  0 - 0.9   POC MID % 6.9  0 - 12 %M   POC Granulocyte 7.6 (*) 2 - 6.9   Granulocyte percent 67.4  37 - 80 %G   RBC 4.96  4.04 - 5.48 M/uL   Hemoglobin 13.8  12.2 - 16.2 g/dL   HCT, POC 16.1  09.6 - 47.9 %   MCV 90.2  80 - 97 fL   MCH, POC 27.8  27 - 31.2 pg   MCHC 30.9 (*) 31.8 - 35.4 g/dL   RDW, POC 04.5     Platelet  Count, POC 353  142 - 424 K/uL   MPV 8.5  0 - 99.8 fL    CBC, UA, C. Met  Treat symptomatically with dicyclomine and Zofran.  Return if worse or not improving

## 2012-11-02 ENCOUNTER — Encounter: Payer: Self-pay | Admitting: Family Medicine

## 2012-11-07 ENCOUNTER — Other Ambulatory Visit: Payer: Self-pay | Admitting: Family Medicine

## 2012-11-08 ENCOUNTER — Other Ambulatory Visit: Payer: Self-pay | Admitting: Family Medicine

## 2012-11-09 NOTE — Telephone Encounter (Signed)
Called pt and verified that she does want this Rx locally now since she doesn't have ins anymore. I am resending Rx to [facility].

## 2012-11-11 ENCOUNTER — Ambulatory Visit: Payer: Self-pay | Admitting: Family Medicine

## 2012-11-11 VITALS — BP 90/59 | HR 79 | Temp 97.5°F | Resp 16 | Wt 188.4 lb

## 2012-11-11 DIAGNOSIS — F419 Anxiety disorder, unspecified: Secondary | ICD-10-CM

## 2012-11-11 DIAGNOSIS — F329 Major depressive disorder, single episode, unspecified: Secondary | ICD-10-CM

## 2012-11-11 DIAGNOSIS — F341 Dysthymic disorder: Secondary | ICD-10-CM

## 2012-11-11 DIAGNOSIS — F32A Depression, unspecified: Secondary | ICD-10-CM

## 2012-11-11 DIAGNOSIS — L2089 Other atopic dermatitis: Secondary | ICD-10-CM

## 2012-11-11 DIAGNOSIS — L2081 Atopic neurodermatitis: Secondary | ICD-10-CM

## 2012-11-11 NOTE — Patient Instructions (Addendum)
Discontinue the ondansetron  If the nausea persists too badly I will call in some Phenergan for you.  Keep using lotion on your skin. You can try over-the-counter hydrocortisone cream if his itching too badly.  If more symptoms of allergy are getting worse please return

## 2012-11-11 NOTE — Progress Notes (Signed)
Subjective: Patient is here for a problem that she thinks may have developed out of last week's visit. I gave her dicyclomine and Zofran. She developed a rash about 6 hours after starting the Zofran. She started the dicyclomine a few days earlier didn't have any problems. Rashes on arms and the V. of her neck and upper cheeks. She has been scratching at it some. She and her husband are both depressed because they cannot seem to make contact with anybody that will help them. She's been disabled for the last 4 years. Her neurosurgeon one or call her back. He his permission is necessary for her to go to a specialist at [facility], and he has not signed the form. No other major symptoms.  Objective: Has a flushed cheek a rash. She's had some erythematous areas on her arms from the sleeves down and in the V. of the neck. No major excoriations. She is very anxious and depressed. Throat looks clear. Chest clear.  Assessment: Possible neurodermatitis Nausea and diarrhea, stable Anxiety and depression  Plan: Reassurance. Stay away from the Zofran. Did not give her any more medication. She is on too many medications already at this time. If she gets worse she's to inform us.

## 2012-12-17 ENCOUNTER — Other Ambulatory Visit: Payer: Self-pay | Admitting: Family Medicine

## 2012-12-18 ENCOUNTER — Other Ambulatory Visit: Payer: Self-pay | Admitting: Family Medicine

## 2012-12-25 ENCOUNTER — Other Ambulatory Visit: Payer: Self-pay | Admitting: Family Medicine

## 2012-12-29 ENCOUNTER — Encounter: Payer: Self-pay | Admitting: Family Medicine

## 2012-12-29 ENCOUNTER — Ambulatory Visit: Payer: Self-pay | Admitting: Family Medicine

## 2012-12-29 VITALS — BP 117/72 | HR 68 | Temp 97.8°F | Resp 16 | Ht 62.0 in | Wt 185.0 lb

## 2012-12-29 DIAGNOSIS — G43909 Migraine, unspecified, not intractable, without status migrainosus: Secondary | ICD-10-CM

## 2012-12-29 DIAGNOSIS — E785 Hyperlipidemia, unspecified: Secondary | ICD-10-CM

## 2012-12-29 DIAGNOSIS — E039 Hypothyroidism, unspecified: Secondary | ICD-10-CM

## 2012-12-29 DIAGNOSIS — R197 Diarrhea, unspecified: Secondary | ICD-10-CM

## 2012-12-29 MED ORDER — LEVOTHYROXINE SODIUM 50 MCG PO TABS: 50.0000 ug | ORAL_TABLET | Freq: Every day | ORAL | Status: DC

## 2012-12-29 MED ORDER — FUROSEMIDE 40 MG PO TABS: ORAL_TABLET | ORAL | Status: DC

## 2012-12-29 MED ORDER — TOPIRAMATE 100 MG PO TABS: 100.0000 mg | ORAL_TABLET | Freq: Every day | ORAL | Status: DC

## 2012-12-29 MED ORDER — PROPRANOLOL HCL ER 120 MG PO CP24: ORAL_CAPSULE | ORAL | Status: DC

## 2012-12-29 MED ORDER — POTASSIUM CHLORIDE CRYS ER 20 MEQ PO TBCR: EXTENDED_RELEASE_TABLET | ORAL | Status: DC

## 2012-12-29 MED ORDER — ROSUVASTATIN CALCIUM 20 MG PO TABS: 20.0000 mg | ORAL_TABLET | Freq: Every day | ORAL | Status: DC

## 2012-12-29 MED ORDER — CLONAZEPAM 0.5 MG PO TABS: ORAL_TABLET | ORAL | Status: DC

## 2012-12-29 MED ORDER — SUMATRIPTAN SUCCINATE 50 MG PO TABS: 50.0000 mg | ORAL_TABLET | ORAL | Status: DC | PRN

## 2012-12-29 MED ORDER — AMITRIPTYLINE HCL 25 MG PO TABS: 25.0000 mg | ORAL_TABLET | Freq: Every day | ORAL | Status: DC

## 2012-12-29 NOTE — Patient Instructions (Signed)
You have been taking clonazepam 0.5mg  twice a day as needed for anxiety and sleep.  You can take it twice a day DURING the day as needed for anxiety and then you can take 1 to 2 tabs at night to help you sleep better. If you want to try 1 tab and then if you wake up after a few hours you can take a second dose. We will increase amitriptyline from 10mg  to 25mg  daily. Come back and see me in about 1-2 months so we can recheck on your kidneys and liver and your diarrhea and see if you are sleeping any better.

## 2013-01-01 NOTE — Progress Notes (Signed)
Subjective:    Patient ID: Jennifer Davidson, female    DOB: March 30, 1956, 57 y.o.   MRN: [id number] Chief Complaint  Patient presents with  . Follow-up    thyroid    HPI  Saphira is here with her granddaughter and her husband today.  Her husband just changed jobs so that he can be home with Zenith more but for the next month they are without health insurance until their new insurance kicks in.  Her tremor is much improved, almost resolved.  Referral to neurologist for this never happened.  Continues to have chronic diarrhea, did not try bentyl.    Continues to have diffuse itching - she is scratching herself to the point where she is bleeding.  Still not sleeping at all and having more anxiety.  She has started driving some recently - which would be huge for Miabella to be able to be more independent and less isolated.  Past Medical History  Diagnosis Date  . Hypertension   . Cholesterol serum increased   . Thyroid disease   . Sleep apnea   . Migraine   . Depression   . Neuromuscular disorder   . GERD (gastroesophageal reflux disease)   . Anxiety    Current Outpatient Prescriptions on File Prior to Visit  Medication Sig Dispense Refill  . azelastine (ASTELIN) 137 MCG/SPRAY nasal spray Place 2 sprays into the nose 2 (two) times daily. Use in each nostril as directed  30 mL  3  . citalopram (CELEXA) 40 MG tablet TAKE 1 TABLET (40 MG TOTAL) BY MOUTH DAILY.  90 tablet  1  . fenofibrate (TRICOR) 145 MG tablet TAKE 1 TABLET (145 MG TOTAL) BY MOUTH DAILY.  90 tablet  0  . fentaNYL (DURAGESIC - DOSED MCG/HR) 25 MCG/HR Place 1 patch onto the skin every 3 (three) days.      . fluticasone (FLONASE) 50 MCG/ACT nasal spray Place 2 sprays into the nose at bedtime.  16 g  6  . gabapentin (NEURONTIN) 300 MG capsule Take 300 mg by mouth 3 (three) times daily.       Marland Kitchen HYDROmorphone HCl (EXALGO) 16 MG T24A Take by mouth 2 (two) times daily before lunch and supper.       No current facility-administered  medications on file prior to visit.   No Known Allergies   Review of Systems  Constitutional: Positive for fatigue.  Cardiovascular: Positive for leg swelling. Negative for palpitations.  Gastrointestinal: Positive for diarrhea. Negative for nausea, vomiting and constipation.  Skin: Positive for rash.  Neurological: Positive for weakness, numbness and headaches. Negative for tremors, seizures, syncope and speech difficulty.  Psychiatric/Behavioral: Positive for sleep disturbance and dysphoric mood. The patient is nervous/anxious. The patient is not hyperactive.       BP 117/72  Pulse 68  Temp(Src) 97.8 F (36.6 C) (Oral)  Resp 16  Ht 5\' 2"  (1.575 m)  Wt 185 lb (83.915 kg)  BMI 33.83 kg/m2  SpO2 97% Objective:   Physical Exam  Constitutional: She is oriented to person, place, and time. She appears well-developed and well-nourished. No distress.  Walks with cane.  HENT:  Head: Normocephalic and atraumatic.  Right Ear: External ear normal.  Left Ear: External ear normal.  Eyes: Conjunctivae are normal. No scleral icterus.  Neck: Normal range of motion. Neck supple. No thyromegaly present.  Cardiovascular: Normal rate, regular rhythm, normal heart sounds and intact distal pulses.   Pulmonary/Chest: Effort normal and breath sounds normal. No respiratory distress.  Musculoskeletal: She exhibits no edema.  Lymphadenopathy:    She has no cervical adenopathy.  Neurological: She is alert and oriented to person, place, and time. She displays no tremor. She exhibits abnormal muscle tone. Gait abnormal.  Skin: Skin is warm and dry. She is not diaphoretic. No erythema.  Psychiatric: She has a normal mood and affect. Her behavior is normal.          Assessment & Plan:  Diarrhea, chronic  Migraine - Plan: SUMAtriptan (IMITREX) 50 MG tablet  Hyperlipidemia - Plan: rosuvastatin (CRESTOR) 20 MG tablet  Hypothyroid - Plan: levothyroxine (SYNTHROID, LEVOTHROID) 50 MCG tablet   Long  term use of medications - kidneys and liver mildly elev at last 2 labs from baseline. Recheck at f/u in 6 wks.  Insomnia - increase elavil from 10 to 25mg  and may want to go up further at f/u.  Try clonazepam 1mg  at night prn.  Chronic pain - followed by pain medicine Dr. Manon Hilding and neurosurgery - waiting for neurosurg referral to [facility] but taking forever as through worker's comp, can't get neurosurg to release records. Increased elavil.  Depression/anxiety - cont citalopram 40, increase elavil from 10 to 25, increase clonazepam 0.5mg  from bid to tid prn.  Watch for fatigue and sedation which have been major problems in the past.  Meds ordered this encounter  Medications  . topiramate (TOPAMAX) 100 MG tablet    Sig: Take 1 tablet (100 mg total) by mouth at bedtime.    Dispense:  90 tablet    Refill:  3  . clonazePAM (KLONOPIN) 0.5 MG tablet    Sig: TAKE 1 TABLET BY MOUTH twice A DAY AS NEEDED FOR ANXIETY. Can take 2 tabs at night for insomnia    Dispense:  120 tablet    Refill:  1  . SUMAtriptan (IMITREX) 50 MG tablet    Sig: Take 1 tablet (50 mg total) by mouth every 2 (two) hours as needed for migraine.    Dispense:  10 tablet    Refill:  3  . rosuvastatin (CRESTOR) 20 MG tablet    Sig: Take 1 tablet (20 mg total) by mouth at bedtime.    Dispense:  90 tablet    Refill:  1  . propranolol ER (INDERAL LA) 120 MG 24 hr capsule    Sig: TAKE 1 CAPSULE (120 MG TOTAL) BY MOUTH DAILY.    Dispense:  90 capsule    Refill:  1  . levothyroxine (SYNTHROID, LEVOTHROID) 50 MCG tablet    Sig: Take 1 tablet (50 mcg total) by mouth daily.    Dispense:  90 tablet    Refill:  1  . potassium chloride SA (KLOR-CON M20) 20 MEQ tablet    Sig: TAKE 2 TABLETS BY MOUTH 2 TIMES DAILY.    Dispense:  360 tablet    Refill:  0  . furosemide (LASIX) 40 MG tablet    Sig: TAKE 1 TABLET TWICE A DAY    Dispense:  180 tablet    Refill:  1  . amitriptyline (ELAVIL) 25 MG tablet    Sig: Take 1 tablet (25 mg  total) by mouth at bedtime.    Dispense:  90 tablet    Refill:  0

## 2013-02-03 ENCOUNTER — Ambulatory Visit: Payer: Self-pay | Admitting: Family Medicine

## 2013-02-03 VITALS — BP 90/64 | HR 77 | Temp 98.3°F | Resp 18 | Ht 62.0 in | Wt 181.0 lb

## 2013-02-03 DIAGNOSIS — Z79899 Other long term (current) drug therapy: Secondary | ICD-10-CM

## 2013-02-03 DIAGNOSIS — R197 Diarrhea, unspecified: Secondary | ICD-10-CM

## 2013-02-03 DIAGNOSIS — E860 Dehydration: Secondary | ICD-10-CM

## 2013-02-03 DIAGNOSIS — F341 Dysthymic disorder: Secondary | ICD-10-CM

## 2013-02-03 DIAGNOSIS — R112 Nausea with vomiting, unspecified: Secondary | ICD-10-CM

## 2013-02-03 DIAGNOSIS — F329 Major depressive disorder, single episode, unspecified: Secondary | ICD-10-CM

## 2013-02-03 DIAGNOSIS — N179 Acute kidney failure, unspecified: Secondary | ICD-10-CM

## 2013-02-03 DIAGNOSIS — N10 Acute tubulo-interstitial nephritis: Secondary | ICD-10-CM

## 2013-02-03 LAB — POCT CBC
Granulocyte percent: 74.5 % (ref 37–80)
HCT, POC: 39.1 % (ref 37.7–47.9)
Hemoglobin: 12.3 g/dL (ref 12.2–16.2)
Lymph, poc: 3.3 (ref 0.6–3.4)
MCH, POC: 27.6 pg (ref 27–31.2)
MCHC: 31.5 g/dL — AB (ref 31.8–35.4)
MCV: 87.6 fL (ref 80–97)
MID (cbc): 2.1 — AB (ref 0–0.9)
MPV: 9.6 fL (ref 0–99.8)
POC Granulocyte: 15.6 — AB (ref 2–6.9)
POC LYMPH PERCENT: 15.6 % (ref 10–50)
POC MID %: 9.9 % (ref 0–12)
Platelet Count, POC: 305 K/uL (ref 142–424)
RBC: 4.46 M/uL (ref 4.04–5.48)
RDW, POC: 15.7 %
WBC: 21 K/uL — AB (ref 4.6–10.2)

## 2013-02-03 LAB — POCT UA - MICROSCOPIC ONLY
Crystals, Ur, HPF, POC: NEGATIVE
Yeast, UA: POSITIVE

## 2013-02-03 LAB — COMPREHENSIVE METABOLIC PANEL
AST: 70 U/L — ABNORMAL HIGH (ref 0–37)
Albumin: 3.4 g/dL — ABNORMAL LOW (ref 3.5–5.2)
Alkaline Phosphatase: 92 U/L (ref 39–117)
BUN: 48 mg/dL — ABNORMAL HIGH (ref 6–23)
Calcium: 8.7 mg/dL (ref 8.4–10.5)
Chloride: 94 mEq/L — ABNORMAL LOW (ref 96–112)
Creat: 2.65 mg/dL — ABNORMAL HIGH (ref 0.50–1.10)
Glucose, Bld: 77 mg/dL (ref 70–99)
Potassium: 4.9 mEq/L (ref 3.5–5.3)

## 2013-02-03 LAB — POCT URINALYSIS DIPSTICK
Glucose, UA: NEGATIVE
Nitrite, UA: POSITIVE
Spec Grav, UA: 1.01
Urobilinogen, UA: 0.2
pH, UA: 5.5

## 2013-02-03 NOTE — Progress Notes (Signed)
Subjective:    Patient ID: Jennifer Davidson, female    DOB: Oct 11, 1955, 57 y.o.   MRN: [id number] Chief Complaint  Patient presents with  . Emesis  . Diarrhea  . Anorexia    has stopped eating for about 3 weeks due to vomiting    HPI  For about 2 weeks now she hasn't felt well and felt nauseas with food.  Falls very hard if she moves without the walker.  Very weak. Sleeps from 10 a.m. To 5 p.m.  While husband is home, getting more confused.  Anytime she eats anything has severe diarrhea with fecal incontinence.  Urinates 10 times a day or more but drinking continuously.  No emesis, able to drink fine.   Stays very confused and has started having nightmares again - it stopped after we changed some of her medicine prior. She wants to take a whole pill of her pain medicine but feels like it is giving her nightmares so given 1/2 tab 3x/d but hasn't taken a full day of her medicine - she doesn't eat enough to take her medicine, can't keep food in her, then when her husband is working at night, she thinks there are people in her house and outside - started since she stopped eating.  Was seen here prev for diarrhea which resolved but this time the imodium a.d. and pepto isn't do anything. Nerves are really bad - abut her back and family issues that are out of her control - getting the best of her.  Sxs get worse o/n when husband is gone.  Arline Asp tries to ehlp. Having RLQ abd pain - at times severe. Had trouble w/ lawyer and doctor with worker's comp - won't help her and can't settle - has been 3 yrs - trapped - and worries about that Then in a lot of pain but can't take her medicine Not eating as tired of having diarrhea.  Staying extremely thirsty.  Past Medical History  Diagnosis Date  . Hypertension   . Cholesterol serum increased   . Thyroid disease   . Sleep apnea   . Migraine   . Depression   . Neuromuscular disorder   . GERD (gastroesophageal reflux disease)   . Anxiety     Current  Outpatient Prescriptions on File Prior to Visit  Medication Sig Dispense Refill  . amitriptyline (ELAVIL) 25 MG tablet Take 1 tablet (25 mg total) by mouth at bedtime.  90 tablet  0  . azelastine (ASTELIN) 137 MCG/SPRAY nasal spray Place 2 sprays into the nose 2 (two) times daily. Use in each nostril as directed  30 mL  3  . citalopram (CELEXA) 40 MG tablet TAKE 1 TABLET (40 MG TOTAL) BY MOUTH DAILY.  90 tablet  1  . clonazePAM (KLONOPIN) 0.5 MG tablet TAKE 1 TABLET BY MOUTH twice A DAY AS NEEDED FOR ANXIETY. Can take 2 tabs at night for insomnia  120 tablet  1  . fenofibrate (TRICOR) 145 MG tablet TAKE 1 TABLET (145 MG TOTAL) BY MOUTH DAILY.  90 tablet  0  . fluticasone (FLONASE) 50 MCG/ACT nasal spray Place 2 sprays into the nose at bedtime.  16 g  6  . furosemide (LASIX) 40 MG tablet TAKE 1 TABLET TWICE A DAY  180 tablet  1  . gabapentin (NEURONTIN) 300 MG capsule Take 300 mg by mouth 3 (three) times daily.       Marland Kitchen HYDROmorphone HCl (EXALGO) 16 MG T24A Take by mouth 2 (two) times  daily before lunch and supper.      . levothyroxine (SYNTHROID, LEVOTHROID) 50 MCG tablet Take 1 tablet (50 mcg total) by mouth daily.  90 tablet  1  . potassium chloride SA (KLOR-CON M20) 20 MEQ tablet TAKE 2 TABLETS BY MOUTH 2 TIMES DAILY.  360 tablet  0  . propranolol ER (INDERAL LA) 120 MG 24 hr capsule TAKE 1 CAPSULE (120 MG TOTAL) BY MOUTH DAILY.  90 capsule  1  . rosuvastatin (CRESTOR) 20 MG tablet Take 1 tablet (20 mg total) by mouth at bedtime.  90 tablet  1  . SUMAtriptan (IMITREX) 50 MG tablet Take 1 tablet (50 mg total) by mouth every 2 (two) hours as needed for migraine.  10 tablet  3  . topiramate (TOPAMAX) 100 MG tablet Take 1 tablet (100 mg total) by mouth at bedtime.  90 tablet  3   No current facility-administered medications on file prior to visit.   No Known Allergies   Review of Systems  Constitutional: Positive for activity change, appetite change and fatigue. Negative for fever, chills and  unexpected weight change.  Respiratory: Negative for shortness of breath.   Cardiovascular: Negative for chest pain and leg swelling.  Gastrointestinal: Positive for nausea, abdominal pain and diarrhea. Negative for vomiting, constipation, blood in stool, abdominal distention and anal bleeding.  Genitourinary: Positive for frequency, flank pain and decreased urine volume. Negative for dysuria and difficulty urinating.  Musculoskeletal: Positive for back pain, arthralgias and gait problem.  Skin: Positive for pallor. Negative for rash.  Neurological: Positive for dizziness, weakness, light-headedness and headaches.  Hematological: Negative for adenopathy.  Psychiatric/Behavioral: Positive for hallucinations, behavioral problems, confusion, dysphoric mood, decreased concentration and agitation. Negative for sleep disturbance. The patient is nervous/anxious. The patient is not hyperactive.       BP 94/60  Pulse 83  Temp(Src) 98.3 F (36.8 C) (Oral)  Resp 18  Ht 5\' 2"  (1.575 m)  Wt 181 lb (82.101 kg)  BMI 33.1 kg/m2  SpO2 96% Objective:   Physical Exam  Constitutional: She is oriented to person, place, and time. She appears well-developed and well-nourished. No distress.  HENT:  Head: Normocephalic and atraumatic.  Right Ear: External ear normal.  Left Ear: External ear normal.  Eyes: Conjunctivae are normal. No scleral icterus.  Neck: Normal range of motion. Neck supple. No thyromegaly present.  Cardiovascular: Normal rate, regular rhythm, normal heart sounds, intact distal pulses and normal pulses.   Pulses:      Dorsalis pedis pulses are 2+ on the right side, and 2+ on the left side.  Pt became pre-syncopal with standing even momentarily so could not do orthostatics  Pulmonary/Chest: Effort normal and breath sounds normal. No respiratory distress.  Abdominal: Soft. She exhibits distension. She exhibits no mass. Bowel sounds are decreased. There is no hepatosplenomegaly. There is  generalized tenderness. There is no rebound and no guarding.  Musculoskeletal: She exhibits no edema.  Lymphadenopathy:    She has no cervical adenopathy.  Neurological: She is alert and oriented to person, place, and time.  Skin: Skin is warm and dry. She is not diaphoretic. No erythema.  Psychiatric: She has a normal mood and affect. Her behavior is normal.          Results for orders placed in visit on 02/03/13  COMPREHENSIVE METABOLIC PANEL      Result Value Range   Sodium 127 (*) 135 - 145 mEq/L   Potassium 4.9  3.5 - 5.3 mEq/L  Chloride 94 (*) 96 - 112 mEq/L   CO2 26  19 - 32 mEq/L   Glucose, Bld 77  70 - 99 mg/dL   BUN 48 (*) 6 - 23 mg/dL   Creat 1.61 (*) 0.96 - 1.10 mg/dL   Total Bilirubin 1.0  0.3 - 1.2 mg/dL   Alkaline Phosphatase 92  39 - 117 U/L   AST 70 (*) 0 - 37 U/L   ALT 43 (*) 0 - 35 U/L   Total Protein 7.0  6.0 - 8.3 g/dL   Albumin 3.4 (*) 3.5 - 5.2 g/dL   Calcium 8.7  8.4 - 04.5 mg/dL  POCT CBC      Result Value Range   WBC 21.0 (*) 4.6 - 10.2 K/uL   Lymph, poc 3.3  0.6 - 3.4   POC LYMPH PERCENT 15.6  10 - 50 %L   MID (cbc) 2.1 (*) 0 - 0.9   POC MID % 9.9  0 - 12 %M   POC Granulocyte 15.6 (*) 2 - 6.9   Granulocyte percent 74.5  37 - 80 %G   RBC 4.46  4.04 - 5.48 M/uL   Hemoglobin 12.3  12.2 - 16.2 g/dL   HCT, POC 40.9  81.1 - 47.9 %   MCV 87.6  80 - 97 fL   MCH, POC 27.6  27 - 31.2 pg   MCHC 31.5 (*) 31.8 - 35.4 g/dL   RDW, POC 91.4     Platelet Count, POC 305  142 - 424 K/uL   MPV 9.6  0 - 99.8 fL  POCT UA - MICROSCOPIC ONLY      Result Value Range   WBC, Ur, HPF, POC tntc     RBC, urine, microscopic tntc     Bacteria, U Microscopic 3+     Mucus, UA mod     Epithelial cells, urine per micros 0-3     Crystals, Ur, HPF, POC neg     Casts, Ur, LPF, POC WBC     Yeast, UA pos    POCT URINALYSIS DIPSTICK      Result Value Range   Color, UA yellow     Clarity, UA cloudy     Glucose, UA neg     Bilirubin, UA neg     Ketones, UA neg      Spec Grav, UA 1.010     Blood, UA trace-intact     pH, UA 5.5     Protein, UA neg     Urobilinogen, UA 0.2     Nitrite, UA pos     Leukocytes, UA moderate (2+)     Assessment & Plan:  Anxiety and depression - Plan: Comprehensive metabolic panel, CANCELED: Comprehensive metabolic panel  Encounter for long-term (current) use of other medications - Plan: Comprehensive metabolic panel, CANCELED: Comprehensive metabolic panel  Nausea and vomiting - Plan: POCT CBC, POCT UA - Microscopic Only, POCT urinalysis dipstick, Comprehensive metabolic panel, CANCELED: Comprehensive metabolic panel - after IVF and IV Rocephin in office pt felt much better - was planning to eat dinner.  Acute pyelonephritis - 1g IV Rocephin given (put into 1L of IVF and ran over an hour) - administered in her second bag of LR, plan to treat with IV/IM 1g Rocephin qd x 3 followed by course of po abx.  UClx not sent (unintentional, urine discarded).  Will need diflucan after antibiotics  Acute on chronic renal failure - due to pyelo and pre-renal dehydration - 2L IVF  given in office - stat CMP ordered so results received while pt was in office.  Dehydration - hold lasix and potassium for this week. Stop propranolol due to hypotension. sxs definitely improved after 2L IVF - recheck tomorrow in office and plan to repeat labs, IVF, and IV Rocephin.  Diarrhea - likely due to internal irritation from infection, dehydration, back pain - monitor for now. Stop crestor and tricor this week.

## 2013-02-03 NOTE — Patient Instructions (Addendum)
This is temporary while you are sick - Stop your lasix (furosemide) and your potassium supplement (K). Stop your crestor, tricor, and your propranolol ER for this week.  Pyelonephritis, Adult Pyelonephritis is a kidney infection. In general, there are 2 main types of pyelonephritis:  Infections that come on quickly without any warning (acute pyelonephritis).  Infections that persist for a long period of time (chronic pyelonephritis). CAUSES  Two main causes of pyelonephritis are:  Bacteria traveling from the bladder to the kidney. This is a problem especially in pregnant women. The urine in the bladder can become filled with bacteria from multiple causes, including:  Inflammation of the prostate gland (prostatitis).  Sexual intercourse in females.  Bladder infection (cystitis).  Bacteria traveling from the bloodstream to the tissue part of the kidney. Problems that may increase your risk of getting a kidney infection include:  Diabetes.  Kidney stones or bladder stones.  Cancer.  Catheters placed in the bladder.  Other abnormalities of the kidney or ureter. SYMPTOMS   Abdominal pain.  Pain in the side or flank area.  Fever.  Chills.  Upset stomach.  Blood in the urine (dark urine).  Frequent urination.  Strong or persistent urge to urinate.  Burning or stinging when urinating. DIAGNOSIS  Your caregiver may diagnose your kidney infection based on your symptoms. A urine sample may also be taken. TREATMENT  In general, treatment depends on how severe the infection is.   If the infection is mild and caught early, your caregiver may treat you with oral antibiotics and send you home.  If the infection is more severe, the bacteria may have gotten into the bloodstream. This will require intravenous (IV) antibiotics and a hospital stay. Symptoms may include:  High fever.  Severe flank pain.  Shaking chills.  Even after a hospital stay, your caregiver may  require you to be on oral antibiotics for a period of time.  Other treatments may be required depending upon the cause of the infection. HOME CARE INSTRUCTIONS   Take your antibiotics as directed. Finish them even if you start to feel better.  Make an appointment to have your urine checked to make sure the infection is gone.  Drink enough fluids to keep your urine clear or pale yellow.  Take medicines for the bladder if you have urgency and frequency of urination as directed by your caregiver. SEEK IMMEDIATE MEDICAL CARE IF:   You have a fever or persistent symptoms for more than 2-3 days.  You have a fever and your symptoms suddenly get worse.  You are unable to take your antibiotics or fluids.  You develop shaking chills.  You experience extreme weakness or fainting.  There is no improvement after 2 days of treatment. MAKE SURE YOU:  Understand these instructions.  Will watch your condition.  Will get help right away if you are not doing well or get worse. Document Released: 05/03/2005 Document Revised: 11/02/2011 Document Reviewed: 10/07/2010 [facility] Patient Information 2014 [place], [place].

## 2013-02-03 NOTE — Progress Notes (Signed)
Patient was unable to complete orthostatics because she could not stand

## 2013-02-04 ENCOUNTER — Ambulatory Visit: Payer: Self-pay | Admitting: Family Medicine

## 2013-02-04 ENCOUNTER — Ambulatory Visit: Payer: Self-pay

## 2013-02-04 VITALS — BP 92/58 | HR 76 | Temp 98.0°F | Resp 14 | Ht 62.0 in | Wt 181.0 lb

## 2013-02-04 DIAGNOSIS — N10 Acute tubulo-interstitial nephritis: Secondary | ICD-10-CM

## 2013-02-04 DIAGNOSIS — N189 Chronic kidney disease, unspecified: Secondary | ICD-10-CM

## 2013-02-04 DIAGNOSIS — K567 Ileus, unspecified: Secondary | ICD-10-CM

## 2013-02-04 DIAGNOSIS — N179 Acute kidney failure, unspecified: Secondary | ICD-10-CM

## 2013-02-04 LAB — POCT CBC
HCT, POC: 33.6 % — AB (ref 37.7–47.9)
Lymph, poc: 2.4 (ref 0.6–3.4)
MCHC: 31.8 g/dL (ref 31.8–35.4)
MCV: 87.1 fL (ref 80–97)
MID (cbc): 1.1 — AB (ref 0–0.9)
POC LYMPH PERCENT: 15.4 %L (ref 10–50)
Platelet Count, POC: 304 10*3/uL (ref 142–424)
RDW, POC: 16.1 %

## 2013-02-04 LAB — COMPREHENSIVE METABOLIC PANEL
AST: 50 U/L — ABNORMAL HIGH (ref 0–37)
Alkaline Phosphatase: 81 U/L (ref 39–117)
BUN: 38 mg/dL — ABNORMAL HIGH (ref 6–23)
Creat: 2.22 mg/dL — ABNORMAL HIGH (ref 0.50–1.10)
Glucose, Bld: 105 mg/dL — ABNORMAL HIGH (ref 70–99)
Total Bilirubin: 0.8 mg/dL (ref 0.3–1.2)

## 2013-02-04 MED ORDER — CEFTRIAXONE SODIUM 1 G IJ SOLR
1.0000 g | Freq: Once | INTRAMUSCULAR | Status: AC
  Administered 2013-02-04: 1 g via INTRAMUSCULAR

## 2013-02-04 NOTE — Patient Instructions (Addendum)
Ileus  The intestine (bowel, or gut) is a long muscular tube connecting your stomach to your rectum. If the intestine stops working, food cannot pass through. This is called an ileus. This can happen for a variety of reasons. Ileus is a major medical problem that usually requires hospitalization. If your intestine stops working because of a blockage, this is called a bowel obstruction, and is a different condition.  CAUSES    Surgery in your abdomen. This can last from a few hours to a few days.   An infection or inflammation in the belly (abdomen). This includes inflammation of the lining of the abdomen (peritonitis).   Infection or inflammation in other parts of the body, such as pneumonia or pancreatitis.   Passage of gallstones or kidney stones.   Damage to the nerves or blood vessels which go to the bowel.   Imbalance in the salts in the blood (electrolytes).   Injury to the brain and or spinal cord.   Medications. Many medications can cause ileus or make it worse. The most common of these are strong pain medications.  SYMPTOMS   Symptoms of bowel obstruction come from the bowel inactivity. They may include:   Bloating. Your belly gets bigger (distension).   Pain or discomfort in the abdomen.   Poor appetite, feeling sick to your stomach (nausea) and vomiting.   You may also not be able to hear your normal bowel sounds, such as "growling" in your stomach.  DIAGNOSIS    Your history and a physical exam will usually suggest to your caregiver that you have an ileus.   X-rays or a CT scan of your abdomen will confirm the diagnosis. X-rays, CT scans and lab tests may also suggest the cause.  TREATMENT    Rest the intestine until it starts working again. This is most often accomplished by:   Stopping intake of oral food and drink. Dehydration is prevented by using IV (intravenous) fluids.   Sometimes, a naso-gastric tube (NG tube) is needed. This is a narrow plastic tube inserted through your nose  and into your stomach. It is connected to suction to keep the stomach emptied out. This also helps treat the nausea and vomiting.   If there is an imbalance in the electrolytes, they are corrected with supplements in your intravenous fluids.   Medications that might make an ileus worse might be stopped.   There are no medications that reliably treat ileus, though your caregiver may suggest a trial of certain medications.   If your condition is slow to resolve, you will be re-evaluated to be sure another condition, such as a blockage, is not present.  Ileus is common and usually has a good outcome. Depending on cause of your ileus, it usually can be treated by your caregivers with good results. Sometimes, specialists (surgeons or gastroenterologists) are asked to assist in your care.   HOME CARE INSTRUCTIONS    Follow your caregiver's instructions regarding diet and fluid intake. This will usually include drinking plenty of clear fluids, avoiding alcohol and caffeine, and eating a gentle diet.   Follow your caregiver's instructions regarding activity. A period of rest is sometimes advised before returning to work or school.   Take only medications prescribed by your caregiver. Be especially careful with narcotic pain medication, which can slow your bowel activity and contribute to ileus.   Keep any follow-up appointments with your caregiver or specialists.  SEEK MEDICAL CARE IF:    You have a recurrence   of nausea, vomiting or abdominal discomfort.   You develop fever of more than 102 F (38.9 C).  SEEK IMMEDIATE MEDICAL CARE IF:    You have severe abdominal pain.   You are unable to keep fluids down.  Document Released: 05/06/2003 Document Revised: 07/26/2011 Document Reviewed: 09/05/2008  [employer] Patient Information 2014 [employer].

## 2013-02-04 NOTE — Progress Notes (Signed)
Subjective:    Patient ID: Gillis Santa, female    DOB: 02-May-1956, 57 y.o.   MRN: [id number]  Chief Complaint  Patient presents with  . Follow-up   HPI  Doing a little better but still feeling very weak and very lightheaded. Having LLQ abdominal pain more this a.m.  Last night had some dysuria but not this a.m.  Was able to eat chicken fried rice last night and drank gatorade with her pills and had coke and a biscuit this a.m. without diarrhea.  Has not had a BM since yesterday morning.  Slept well last night w/o nightmares. Did stop the lasix, propranolol, crestor, and tricor as I instructed and took her chronic sched pain meds this a.m.  Past Medical History  Diagnosis Date  . Hypertension   . Cholesterol serum increased   . Thyroid disease   . Sleep apnea   . Migraine   . Depression   . Neuromuscular disorder   . GERD (gastroesophageal reflux disease)   . Anxiety    Current Outpatient Prescriptions on File Prior to Visit  Medication Sig Dispense Refill  . fenofibrate (TRICOR) 145 MG tablet TAKE 1 TABLET (145 MG TOTAL) BY MOUTH DAILY.  90 tablet  0  . fluticasone (FLONASE) 50 MCG/ACT nasal spray Place 2 sprays into the nose at bedtime.  16 g  6  . furosemide (LASIX) 40 MG tablet TAKE 1 TABLET TWICE A DAY  180 tablet  1  . gabapentin (NEURONTIN) 300 MG capsule Take 300 mg by mouth 4 (four) times daily.       . potassium chloride SA (KLOR-CON M20) 20 MEQ tablet TAKE 2 TABLETS BY MOUTH 2 TIMES DAILY.  360 tablet  0  . SUMAtriptan (IMITREX) 50 MG tablet Take 1 tablet (50 mg total) by mouth every 2 (two) hours as needed for migraine.  10 tablet  3  . topiramate (TOPAMAX) 100 MG tablet Take 1 tablet (100 mg total) by mouth at bedtime.  90 tablet  3   No current facility-administered medications on file prior to visit.   No Known Allergies   Review of Systems  Constitutional: Positive for activity change, appetite change and fatigue. Negative for fever, chills and unexpected weight  change.  Respiratory: Negative for shortness of breath.   Cardiovascular: Positive for leg swelling. Negative for chest pain.  Gastrointestinal: Positive for nausea, abdominal pain, diarrhea, constipation, blood in stool and abdominal distention. Negative for vomiting.  Genitourinary: Positive for dysuria. Negative for decreased urine volume and difficulty urinating.  Musculoskeletal: Positive for arthralgias, back pain, gait problem, joint swelling and myalgias.  Skin: Negative for rash.  Neurological: Positive for dizziness, weakness, light-headedness and headaches.  Hematological: Negative for adenopathy.  Psychiatric/Behavioral: Positive for dysphoric mood. Negative for sleep disturbance. The patient is nervous/anxious.        BP 92/58  Pulse 76  Temp(Src) 98 F (36.7 C) (Oral)  Resp 14  Ht 5\' 2"  (1.575 m)  Wt 181 lb (82.101 kg)  BMI 33.1 kg/m2  SpO2 98% Objective:   Physical Exam  Constitutional: She is oriented to person, place, and time. She appears well-developed and well-nourished. She appears lethargic. She appears ill. No distress.  HENT:  Head: Normocephalic and atraumatic.  Right Ear: External ear normal.  Left Ear: External ear normal.  Eyes: Conjunctivae are normal. No scleral icterus.  Neck: Normal range of motion. Neck supple. No thyromegaly present.  Cardiovascular: Normal rate, regular rhythm, normal heart sounds and intact distal  pulses.   Pulmonary/Chest: Effort normal and breath sounds normal. No respiratory distress.  Abdominal: Soft. She exhibits distension. Bowel sounds are increased. There is generalized tenderness. There is no rebound and no guarding.  BS slightly tympanitic.  Most abd pain in LLQ.  Musculoskeletal: She exhibits no edema.  Lymphadenopathy:    She has no cervical adenopathy.  Neurological: She is oriented to person, place, and time. She appears lethargic.  Skin: Skin is warm and dry. She is not diaphoretic. No erythema. There is pallor.   Psychiatric: She has a normal mood and affect. Her behavior is normal.      Results for orders placed in visit on 02/04/13  COMPREHENSIVE METABOLIC PANEL      Result Value Range   Sodium 133 (*) 135 - 145 mEq/L   Potassium 4.1  3.5 - 5.3 mEq/L   Chloride 100  96 - 112 mEq/L   CO2 26  19 - 32 mEq/L   Glucose, Bld 105 (*) 70 - 99 mg/dL   BUN 38 (*) 6 - 23 mg/dL   Creat 1.47 (*) 8.29 - 1.10 mg/dL   Total Bilirubin 0.8  0.3 - 1.2 mg/dL   Alkaline Phosphatase 81  39 - 117 U/L   AST 50 (*) 0 - 37 U/L   ALT 36 (*) 0 - 35 U/L   Total Protein 6.3  6.0 - 8.3 g/dL   Albumin 3.1 (*) 3.5 - 5.2 g/dL   Calcium 8.3 (*) 8.4 - 10.5 mg/dL  POCT CBC      Result Value Range   WBC 15.3 (*) 4.6 - 10.2 K/uL   Lymph, poc 2.4  0.6 - 3.4   POC LYMPH PERCENT 15.4  10 - 50 %L   MID (cbc) 1.1 (*) 0 - 0.9   POC MID % 7.3  0 - 12 %M   POC Granulocyte 11.8 (*) 2 - 6.9   Granulocyte percent 77.3  37 - 80 %G   RBC 3.86 (*) 4.04 - 5.48 M/uL   Hemoglobin 10.7 (*) 12.2 - 16.2 g/dL   HCT, POC 56.2 (*) 13.0 - 47.9 %   MCV 87.1  80 - 97 fL   MCH, POC 27.7  27 - 31.2 pg   MCHC 31.8  31.8 - 35.4 g/dL   RDW, POC 86.5     Platelet Count, POC 304  142 - 424 K/uL   MPV 10.1  0 - 99.8 fL  IFOBT (OCCULT BLOOD)      Result Value Range   IFOBT Negative        Acute abdominal series: copious bowel gas, non-specific pattern, few air-fluid levels, mild distention. Assessment & Plan:   Acute on chronic renal failure - Plan: POCT CBC, Comprehensive metabolic panel, IFOBT POC (occult bld, rslt in office), DG Abd Acute W/Chest  Acute pyelonephritis - Plan: POCT CBC, Comprehensive metabolic panel, cefTRIAXone (ROCEPHIN) injection 1 g, IFOBT POC (occult bld, rslt in office), DG Abd Acute W/Chest  Ileus  Meds ordered this encounter  Medications  . cefTRIAXone (ROCEPHIN) injection 1 g    Sig:     Norberto Sorenson, MD MPH

## 2013-02-05 ENCOUNTER — Ambulatory Visit: Payer: Self-pay | Admitting: Family Medicine

## 2013-02-05 VITALS — BP 118/76 | HR 90 | Temp 98.0°F | Resp 16 | Ht 62.0 in | Wt 185.0 lb

## 2013-02-05 DIAGNOSIS — N12 Tubulo-interstitial nephritis, not specified as acute or chronic: Secondary | ICD-10-CM

## 2013-02-05 DIAGNOSIS — N179 Acute kidney failure, unspecified: Secondary | ICD-10-CM

## 2013-02-05 LAB — POCT CBC
Granulocyte percent: 75.1 %G (ref 37–80)
MCH, POC: 27.8 pg (ref 27–31.2)
MID (cbc): 0.9 (ref 0–0.9)
MPV: 8.8 fL (ref 0–99.8)
POC Granulocyte: 10.6 — AB (ref 2–6.9)
POC LYMPH PERCENT: 18.5 %L (ref 10–50)
POC MID %: 6.4 %M (ref 0–12)
Platelet Count, POC: 406 10*3/uL (ref 142–424)
RBC: 4.14 M/uL (ref 4.04–5.48)
WBC: 14.1 10*3/uL — AB (ref 4.6–10.2)

## 2013-02-05 MED ORDER — FLUCONAZOLE 150 MG PO TABS: 150.0000 mg | ORAL_TABLET | Freq: Once | ORAL | Status: DC

## 2013-02-05 MED ORDER — CEFTRIAXONE SODIUM 1 G IJ SOLR
1.0000 g | Freq: Once | INTRAMUSCULAR | Status: AC
  Administered 2013-02-05: 1 g via INTRAMUSCULAR

## 2013-02-05 MED ORDER — CIPROFLOXACIN HCL 500 MG PO TABS: 500.0000 mg | ORAL_TABLET | Freq: Two times a day (BID) | ORAL | Status: DC

## 2013-02-05 NOTE — Progress Notes (Signed)
Subjective:    Patient ID: Jennifer Davidson, female    DOB: [birth year], 57 y.o.   MRN: [id number] Chief Complaint  Patient presents with  . Follow-up    HPI  Jennifer Davidson is doing much better today.  She passed gas yesterday and had a BM as well.  She was able to drink lots of gatorade and is feeling back to be her normal self.  Past Medical History  Diagnosis Date  . Hypertension   . Cholesterol serum increased   . Thyroid disease   . Sleep apnea   . Migraine   . Depression   . Neuromuscular disorder   . GERD (gastroesophageal reflux disease)   . Anxiety    Current Outpatient Prescriptions on File Prior to Visit  Medication Sig Dispense Refill  . amitriptyline (ELAVIL) 25 MG tablet Take 1 tablet (25 mg total) by mouth at bedtime.  90 tablet  0  . azelastine (ASTELIN) 137 MCG/SPRAY nasal spray Place 2 sprays into the nose 2 (two) times daily. Use in each nostril as directed  30 mL  3  . citalopram (CELEXA) 40 MG tablet TAKE 1 TABLET (40 MG TOTAL) BY MOUTH DAILY.  90 tablet  1  . clonazePAM (KLONOPIN) 0.5 MG tablet TAKE 1 TABLET BY MOUTH twice A DAY AS NEEDED FOR ANXIETY. Can take 2 tabs at night for insomnia  120 tablet  1  . fenofibrate (TRICOR) 145 MG tablet TAKE 1 TABLET (145 MG TOTAL) BY MOUTH DAILY.  90 tablet  0  . fluticasone (FLONASE) 50 MCG/ACT nasal spray Place 2 sprays into the nose at bedtime.  16 g  6  . furosemide (LASIX) 40 MG tablet TAKE 1 TABLET TWICE A DAY  180 tablet  1  . gabapentin (NEURONTIN) 300 MG capsule Take 300 mg by mouth 3 (three) times daily.       Marland Kitchen HYDROmorphone HCl (EXALGO) 16 MG T24A Take by mouth 2 (two) times daily before lunch and supper.      . levothyroxine (SYNTHROID, LEVOTHROID) 50 MCG tablet Take 1 tablet (50 mcg total) by mouth daily.  90 tablet  1  . potassium chloride SA (KLOR-CON M20) 20 MEQ tablet TAKE 2 TABLETS BY MOUTH 2 TIMES DAILY.  360 tablet  0  . propranolol ER (INDERAL LA) 120 MG 24 hr capsule TAKE 1 CAPSULE (120 MG TOTAL) BY MOUTH DAILY.   90 capsule  1  . rosuvastatin (CRESTOR) 20 MG tablet Take 1 tablet (20 mg total) by mouth at bedtime.  90 tablet  1  . SUMAtriptan (IMITREX) 50 MG tablet Take 1 tablet (50 mg total) by mouth every 2 (two) hours as needed for migraine.  10 tablet  3  . topiramate (TOPAMAX) 100 MG tablet Take 1 tablet (100 mg total) by mouth at bedtime.  90 tablet  3   No current facility-administered medications on file prior to visit.   No Known Allergies   Review of Systems  Constitutional: Negative for fever and chills.  Respiratory: Negative for shortness of breath.   Cardiovascular: Negative for chest pain and leg swelling.  Gastrointestinal: Negative for nausea, vomiting, abdominal pain, diarrhea, constipation and abdominal distention.  Genitourinary: Negative for dysuria, urgency and pelvic pain.  Psychiatric/Behavioral: Negative for sleep disturbance.       BP 118/76  Pulse 90  Temp(Src) 98 F (36.7 C) (Oral)  Resp 16  Ht 5\' 2"  (1.575 m)  Wt 185 lb (83.915 kg)  BMI 33.83 kg/m2  SpO2 99%  Objective:   Physical Exam  Constitutional: She is oriented to person, place, and time. She appears well-developed and well-nourished. No distress.  HENT:  Head: Normocephalic and atraumatic.  Right Ear: External ear normal.  Left Ear: External ear normal.  Eyes: Conjunctivae are normal. No scleral icterus.  Neck: Normal range of motion. Neck supple. No thyromegaly present.  Cardiovascular: Normal rate, regular rhythm, normal heart sounds and intact distal pulses.   Pulmonary/Chest: Effort normal and breath sounds normal. No respiratory distress.  Abdominal: Soft. Bowel sounds are normal. She exhibits no distension and no mass. There is no tenderness. There is no rebound and no guarding.  Musculoskeletal: She exhibits no edema.  Lymphadenopathy:    She has no cervical adenopathy.  Neurological: She is alert and oriented to person, place, and time.  Skin: Skin is warm and dry. She is not diaphoretic.  No erythema.  Psychiatric: She has a normal mood and affect. Her behavior is normal.      Assessment & Plan:  Pyelonephritis - Plan: cefTRIAXone (ROCEPHIN) injection 1 g, POCT CBC, Basic metabolic panel, CANCELED: POCT CBC - 3rd dose IM Rocephin today. Start 7d bid cipro to complete treatment course. Recheck on Fri - 5d.    Acute on chronic renal failure - Plan: POCT CBC, Basic metabolic panel, CANCELED: Basic metabolic panel - Recheck renal function today - due to prerenal dehydration, cont to push fluids.    Ileus - resolved, slowly advance diet.  Norberto Sorenson, MD MPH

## 2013-02-05 NOTE — Patient Instructions (Addendum)
Take 1 diflucan today and the second one in 1 week after all the antibiotics are complete.  Start your first dose of cipro tonight and continue it twice a day for a week.  You can start eating soft foods - like yogurt, pudding, ice cream, oatmeal, cream of wheat and if you do ok on that you can slowly resume normal foods.  I will see you this Friday at 10:45 at our clinic next door.  Do not resume any of the medications we stopped this week (hold off on your lasix, potassium, propranolol, crestor, and tricor). We will reassess on Friday and see if you need to restart any of these medications.  Pyelonephritis, Adult Pyelonephritis is a kidney infection. In general, there are 2 main types of pyelonephritis:  Infections that come on quickly without any warning (acute pyelonephritis).  Infections that persist for a long period of time (chronic pyelonephritis). CAUSES  Two main causes of pyelonephritis are:  Bacteria traveling from the bladder to the kidney. This is a problem especially in pregnant women. The urine in the bladder can become filled with bacteria from multiple causes, including:  Inflammation of the prostate gland (prostatitis).  Sexual intercourse in females.  Bladder infection (cystitis).  Bacteria traveling from the bloodstream to the tissue part of the kidney. Problems that may increase your risk of getting a kidney infection include:  Diabetes.  Kidney stones or bladder stones.  Cancer.  Catheters placed in the bladder.  Other abnormalities of the kidney or ureter. SYMPTOMS   Abdominal pain.  Pain in the side or flank area.  Fever.  Chills.  Upset stomach.  Blood in the urine (dark urine).  Frequent urination.  Strong or persistent urge to urinate.  Burning or stinging when urinating. DIAGNOSIS  Your caregiver may diagnose your kidney infection based on your symptoms. A urine sample may also be taken. TREATMENT  In general, treatment depends on how  severe the infection is.   If the infection is mild and caught early, your caregiver may treat you with oral antibiotics and send you home.  If the infection is more severe, the bacteria may have gotten into the bloodstream. This will require intravenous (IV) antibiotics and a hospital stay. Symptoms may include:  High fever.  Severe flank pain.  Shaking chills.  Even after a hospital stay, your caregiver may require you to be on oral antibiotics for a period of time.  Other treatments may be required depending upon the cause of the infection. HOME CARE INSTRUCTIONS   Take your antibiotics as directed. Finish them even if you start to feel better.  Make an appointment to have your urine checked to make sure the infection is gone.  Drink enough fluids to keep your urine clear or pale yellow.  Take medicines for the bladder if you have urgency and frequency of urination as directed by your caregiver. SEEK IMMEDIATE MEDICAL CARE IF:   You have a fever or persistent symptoms for more than 2-3 days.  You have a fever and your symptoms suddenly get worse.  You are unable to take your antibiotics or fluids.  You develop shaking chills.  You experience extreme weakness or fainting.  There is no improvement after 2 days of treatment. MAKE SURE YOU:  Understand these instructions.  Will watch your condition.  Will get help right away if you are not doing well or get worse. Document Released: 05/03/2005 Document Revised: 11/02/2011 Document Reviewed: 10/07/2010 [facility] Patient Information 2014 [place], [place].

## 2013-02-06 LAB — BASIC METABOLIC PANEL
BUN: 28 mg/dL — ABNORMAL HIGH (ref 6–23)
CO2: 24 mEq/L (ref 19–32)
Chloride: 104 mEq/L (ref 96–112)
Glucose, Bld: 115 mg/dL — ABNORMAL HIGH (ref 70–99)
Potassium: 4.1 mEq/L (ref 3.5–5.3)
Sodium: 136 mEq/L (ref 135–145)

## 2013-02-09 ENCOUNTER — Encounter: Payer: Self-pay | Admitting: Family Medicine

## 2013-02-09 ENCOUNTER — Ambulatory Visit: Payer: Self-pay | Admitting: Family Medicine

## 2013-02-09 VITALS — BP 100/68 | HR 108 | Temp 97.9°F | Resp 16 | Ht 63.5 in | Wt 181.0 lb

## 2013-02-09 DIAGNOSIS — N10 Acute tubulo-interstitial nephritis: Secondary | ICD-10-CM

## 2013-02-09 DIAGNOSIS — R112 Nausea with vomiting, unspecified: Secondary | ICD-10-CM

## 2013-02-09 DIAGNOSIS — R109 Unspecified abdominal pain: Secondary | ICD-10-CM

## 2013-02-09 DIAGNOSIS — E860 Dehydration: Secondary | ICD-10-CM

## 2013-02-09 DIAGNOSIS — R531 Weakness: Secondary | ICD-10-CM

## 2013-02-09 LAB — POCT CBC
Granulocyte percent: 73.2 %G (ref 37–80)
HCT, POC: 39.5 % (ref 37.7–47.9)
MCH, POC: 28 pg (ref 27–31.2)
MCV: 90.7 fL (ref 80–97)
POC LYMPH PERCENT: 22 %L (ref 10–50)
Platelet Count, POC: 494 10*3/uL — AB (ref 142–424)
RBC: 4.35 M/uL (ref 4.04–5.48)
RDW, POC: 16.7 %
WBC: 8.9 10*3/uL (ref 4.6–10.2)

## 2013-02-09 LAB — BASIC METABOLIC PANEL
BUN: 15 mg/dL (ref 6–23)
Calcium: 9.3 mg/dL (ref 8.4–10.5)
Chloride: 107 mEq/L (ref 96–112)
Creat: 1.42 mg/dL — ABNORMAL HIGH (ref 0.50–1.10)
Glucose, Bld: 103 mg/dL — ABNORMAL HIGH (ref 70–99)

## 2013-02-09 LAB — POCT URINALYSIS DIPSTICK
Blood, UA: NEGATIVE
Glucose, UA: NEGATIVE
Nitrite, UA: NEGATIVE
Spec Grav, UA: 1.015
Urobilinogen, UA: 1
pH, UA: 6.5

## 2013-02-09 LAB — POCT UA - MICROSCOPIC ONLY
Casts, Ur, LPF, POC: NEGATIVE
Crystals, Ur, HPF, POC: NEGATIVE
RBC, urine, microscopic: NEGATIVE
Yeast, UA: NEGATIVE

## 2013-02-09 NOTE — Patient Instructions (Signed)
You are on the right path so continue what you have been doing.  Try to start walking some outside for some exercises - short distances and use your walker.  You are still dehydrated so continue to push fluids. It is ok to restart carbonated beverages in small amounts.  Gradually increase your diet - try some yogurt, pudding, milkshake, protein drink, etc.  We still do not want you to restart on your furosemide (lasix), potassium, propranolol, crestor, or fenofibrate (tricor).  Someone from the office will call when your labs come back and tell you when I want you to follow-up but for now please schedule an appt for next week.

## 2013-02-09 NOTE — Progress Notes (Signed)
Subjective:    Patient ID: Jennifer Davidson, female    DOB: August 02, 1955, 57 y.o.   MRN: [id number] Chief Complaint  Patient presents with  . Follow-up    DEHYDRATION AND VOMITING    HPI  Jennifer Davidson reports she is doing a little better.  She has been drinking tons of fluids but still can't eat much.  Had 1 nml meal and immed vomited it all up. Urine has been odiferous, dark, urinary urgency Stools dark and freq, passing lots of gas Still very weak.  Past Medical History  Diagnosis Date  . Hypertension   . Cholesterol serum increased   . Thyroid disease   . Sleep apnea   . Migraine   . Depression   . Neuromuscular disorder   . GERD (gastroesophageal reflux disease)   . Anxiety    Current Outpatient Prescriptions on File Prior to Visit  Medication Sig Dispense Refill  . amitriptyline (ELAVIL) 25 MG tablet Take 1 tablet (25 mg total) by mouth at bedtime.  90 tablet  0  . ciprofloxacin (CIPRO) 500 MG tablet Take 1 tablet (500 mg total) by mouth 2 (two) times daily.  14 tablet  0  . citalopram (CELEXA) 40 MG tablet TAKE 1 TABLET (40 MG TOTAL) BY MOUTH DAILY.  90 tablet  1  . clonazePAM (KLONOPIN) 0.5 MG tablet TAKE 1 TABLET BY MOUTH twice A DAY AS NEEDED FOR ANXIETY. Can take 2 tabs at night for insomnia  120 tablet  1  . fluconazole (DIFLUCAN) 150 MG tablet Take 1 tablet (150 mg total) by mouth once. Repeat if needed  2 tablet  0  . gabapentin (NEURONTIN) 300 MG capsule Take 300 mg by mouth 3 (three) times daily.       Marland Kitchen HYDROmorphone HCl (EXALGO) 16 MG T24A Take by mouth 2 (two) times daily before lunch and supper.      . levothyroxine (SYNTHROID, LEVOTHROID) 50 MCG tablet Take 1 tablet (50 mcg total) by mouth daily.  90 tablet  1  . potassium chloride SA (KLOR-CON M20) 20 MEQ tablet TAKE 2 TABLETS BY MOUTH 2 TIMES DAILY.  360 tablet  0  . propranolol ER (INDERAL LA) 120 MG 24 hr capsule TAKE 1 CAPSULE (120 MG TOTAL) BY MOUTH DAILY.  90 capsule  1  . SUMAtriptan (IMITREX) 50 MG tablet Take 1  tablet (50 mg total) by mouth every 2 (two) hours as needed for migraine.  10 tablet  3  . topiramate (TOPAMAX) 100 MG tablet Take 1 tablet (100 mg total) by mouth at bedtime.  90 tablet  3  . azelastine (ASTELIN) 137 MCG/SPRAY nasal spray Place 2 sprays into the nose 2 (two) times daily. Use in each nostril as directed  30 mL  3  . fenofibrate (TRICOR) 145 MG tablet TAKE 1 TABLET (145 MG TOTAL) BY MOUTH DAILY.  90 tablet  0  . fluticasone (FLONASE) 50 MCG/ACT nasal spray Place 2 sprays into the nose at bedtime.  16 g  6  . furosemide (LASIX) 40 MG tablet TAKE 1 TABLET TWICE A DAY  180 tablet  1  . rosuvastatin (CRESTOR) 20 MG tablet Take 1 tablet (20 mg total) by mouth at bedtime.  90 tablet  1   No current facility-administered medications on file prior to visit.   Review of Systems  Constitutional: Positive for activity change, appetite change and fatigue. Negative for fever, chills and unexpected weight change.  Respiratory: Negative for shortness of breath.   Cardiovascular:  Negative for chest pain and leg swelling.  Gastrointestinal: Positive for nausea, abdominal pain and diarrhea. Negative for vomiting, constipation, abdominal distention, anal bleeding and rectal pain.  Genitourinary: Positive for decreased urine volume. Negative for dysuria and difficulty urinating.  Musculoskeletal: Positive for myalgias, back pain, arthralgias and gait problem.  Skin: Negative for pallor and rash.  Neurological: Positive for weakness and light-headedness.  Hematological: Negative for adenopathy.      BP 100/68  Pulse 108  Temp(Src) 97.9 F (36.6 C) (Oral)  Resp 16  Ht 5' 3.5" (1.613 m)  Wt 181 lb (82.101 kg)  BMI 31.56 kg/m2  SpO2 100% Objective:   Physical Exam  Constitutional: She is oriented to person, place, and time. She appears well-developed and well-nourished. No distress.  HENT:  Head: Normocephalic and atraumatic.  Right Ear: External ear normal.  Left Ear: External ear  normal.  Eyes: Conjunctivae are normal. No scleral icterus.  Neck: Normal range of motion. Neck supple. No thyromegaly present.  Cardiovascular: Normal rate, regular rhythm, normal heart sounds and intact distal pulses.   Pulmonary/Chest: Effort normal and breath sounds normal. No respiratory distress.  Abdominal: Soft. Bowel sounds are normal. She exhibits no distension. There is no tenderness. There is no rebound, no guarding and no CVA tenderness.  Musculoskeletal: She exhibits no edema.  Lymphadenopathy:    She has no cervical adenopathy.  Neurological: She is alert and oriented to person, place, and time.  Skin: Skin is warm and dry. She is not diaphoretic. No erythema.  Psychiatric: She has a normal mood and affect. Her behavior is normal.      Results for orders placed in visit on 02/09/13  POCT CBC      Result Value Range   WBC 8.9  4.6 - 10.2 K/uL   Lymph, poc 2.0  0.6 - 3.4   POC LYMPH PERCENT 22.0  10 - 50 %L   MID (cbc) 0.4  0 - 0.9   POC MID % 4.8  0 - 12 %M   POC Granulocyte 6.5  2 - 6.9   Granulocyte percent 73.2  37 - 80 %G   RBC 4.35  4.04 - 5.48 M/uL   Hemoglobin 12.2  12.2 - 16.2 g/dL   HCT, POC 16.1  09.6 - 47.9 %   MCV 90.7  80 - 97 fL   MCH, POC 28.0  27 - 31.2 pg   MCHC 30.9 (*) 31.8 - 35.4 g/dL   RDW, POC 04.5     Platelet Count, POC 494 (*) 142 - 424 K/uL   MPV 7.6  0 - 99.8 fL  POCT UA - MICROSCOPIC ONLY      Result Value Range   WBC, Ur, HPF, POC 0-6     RBC, urine, microscopic neg     Bacteria, U Microscopic trace     Mucus, UA neg     Epithelial cells, urine per micros 0-5     Crystals, Ur, HPF, POC neg     Casts, Ur, LPF, POC neg     Yeast, UA neg    POCT URINALYSIS DIPSTICK      Result Value Range   Color, UA yellow     Clarity, UA clear     Glucose, UA neg     Bilirubin, UA neg     Ketones, UA neg     Spec Grav, UA 1.015     Blood, UA neg     pH, UA 6.5  Protein, UA neg     Urobilinogen, UA 1.0     Nitrite, UA neg      Leukocytes, UA small (1+)    IFOBT (OCCULT BLOOD)      Result Value Range   IFOBT Negative     Assessment & Plan:  Dehydration - Plan: POCT CBC, POCT UA - Microscopic Only, POCT urinalysis dipstick, IFOBT POC (occult bld, rslt in office), Urine culture. Do not restart lasix or K yet.  BP still low so do not restart propranolol yet.  Acute pyelonephritis - Plan: POCT CBC, POCT UA - Microscopic Only, POCT urinalysis dipstick, Urine culture - finished 3d of IM Rocephin followed by 1 wk of cipro - will finish course on Mon. Pt still w/ UTI sxs and trace bacteria on UA so recheck clx.  Nausea with vomiting - Plan: Basic metabolic panel - resolved, try to slowly advance diet, still of of cholesterol meds.  Abdominal pain, unspecified site - Plan: IFOBT POC (occult bld, rslt in office), Basic metabolic panel, Urine culture  Weakness - Plan: POCT CBC, Urine culture

## 2013-02-11 LAB — URINE CULTURE: Colony Count: NO GROWTH

## 2013-02-16 ENCOUNTER — Encounter: Payer: Self-pay | Admitting: Family Medicine

## 2013-02-16 ENCOUNTER — Ambulatory Visit: Payer: Self-pay | Admitting: Family Medicine

## 2013-02-16 VITALS — BP 126/78 | HR 100 | Temp 98.0°F | Resp 18 | Ht 63.0 in | Wt 190.4 lb

## 2013-02-16 DIAGNOSIS — F410 Panic disorder [episodic paroxysmal anxiety] without agoraphobia: Secondary | ICD-10-CM

## 2013-02-16 DIAGNOSIS — N189 Chronic kidney disease, unspecified: Secondary | ICD-10-CM

## 2013-02-16 DIAGNOSIS — Z23 Encounter for immunization: Secondary | ICD-10-CM

## 2013-02-16 DIAGNOSIS — R609 Edema, unspecified: Secondary | ICD-10-CM

## 2013-02-16 NOTE — Progress Notes (Signed)
Subjective:    Patient ID: Jennifer Davidson, female    DOB: Jun 06, 1955, 57 y.o.   MRN: [id number] Chief Complaint  Patient presents with  . one week follow up    HPI  Still feeling very weak and lightheaded, tremors are back, not walking much.  Diarrhea anytime she eats anything solid,  Got a big jar of vitamin protein powder that is mixed with yogurt that she tried yesterday.  Pain worse today for some reason.  Starting to have more swelling in her legs again. Taking clonazepam bid - sometimes skips morning dose. Sleeping better, no more nightmares recently.  Jennifer Davidson and her husband are continuing to have trouble.  She is debilitated and wants to do anything but take additional narcotics. Dr. Newell Coral has been her neurosurgeon under worker's comp and he has told them that he had nothing left to offer Jennifer Davidson and so referred her to Dr. Eduard Clos for pain management.  Jennifer Davidson is getting worse and much more physcially limited and mentally depressed over her future. Really wants a second opinion on whether there are any other neurosurgical options for her or anything else than can be done for her pain other than pain medications. Would like a specialist eval at [facility] who has agreed to see her but there understanding is that they can't get Dr. Newell Coral to release her records and give his approval for this eval despite Dr. Ozzie Hoyle support of it.  Past Medical History  Diagnosis Date  . Hypertension   . Cholesterol serum increased   . Thyroid disease   . Sleep apnea   . Migraine   . Depression   . Neuromuscular disorder   . GERD (gastroesophageal reflux disease)   . Anxiety    Current Outpatient Prescriptions on File Prior to Visit  Medication Sig Dispense Refill  . amitriptyline (ELAVIL) 25 MG tablet Take 1 tablet (25 mg total) by mouth at bedtime.  90 tablet  0  . azelastine (ASTELIN) 137 MCG/SPRAY nasal spray Place 2 sprays into the nose 2 (two) times daily. Use in each nostril as directed   30 mL  3  . clonazePAM (KLONOPIN) 0.5 MG tablet TAKE 1 TABLET BY MOUTH twice A DAY AS NEEDED FOR ANXIETY. Can take 2 tabs at night for insomnia  120 tablet  1  . gabapentin (NEURONTIN) 300 MG capsule Take 300 mg by mouth 3 (three) times daily.       Marland Kitchen HYDROmorphone HCl (EXALGO) 16 MG T24A Take by mouth 2 (two) times daily before lunch and supper.      . levothyroxine (SYNTHROID, LEVOTHROID) 50 MCG tablet Take 1 tablet (50 mcg total) by mouth daily.  90 tablet  1  . topiramate (TOPAMAX) 100 MG tablet Take 1 tablet (100 mg total) by mouth at bedtime.  90 tablet  3  . citalopram (CELEXA) 40 MG tablet TAKE 1 TABLET (40 MG TOTAL) BY MOUTH DAILY.  90 tablet  1  . fenofibrate (TRICOR) 145 MG tablet TAKE 1 TABLET (145 MG TOTAL) BY MOUTH DAILY.  90 tablet  0  . fluticasone (FLONASE) 50 MCG/ACT nasal spray Place 2 sprays into the nose at bedtime.  16 g  6  . furosemide (LASIX) 40 MG tablet TAKE 1 TABLET TWICE A DAY  180 tablet  1  . potassium chloride SA (KLOR-CON M20) 20 MEQ tablet TAKE 2 TABLETS BY MOUTH 2 TIMES DAILY.  360 tablet  0  . propranolol ER (INDERAL LA) 120 MG 24 hr  capsule TAKE 1 CAPSULE (120 MG TOTAL) BY MOUTH DAILY.  90 capsule  1  . rosuvastatin (CRESTOR) 20 MG tablet Take 1 tablet (20 mg total) by mouth at bedtime.  90 tablet  1  . SUMAtriptan (IMITREX) 50 MG tablet Take 1 tablet (50 mg total) by mouth every 2 (two) hours as needed for migraine.  10 tablet  3   No current facility-administered medications on file prior to visit.   No Known Allergies  Review of Systems  Cardiovascular: Positive for leg swelling.  Gastrointestinal: Positive for diarrhea and abdominal distention. Negative for nausea, vomiting, abdominal pain, constipation, blood in stool and anal bleeding.  Musculoskeletal: Positive for arthralgias, back pain, gait problem and myalgias.  Skin: Negative for rash.  Neurological: Positive for dizziness, tremors, weakness and light-headedness. Negative for syncope.       BP 126/78  Pulse 100  Temp(Src) 98 F (36.7 C) (Oral)  Resp 18  Ht 5\' 3"  (1.6 m)  Wt 190 lb 6.4 oz (86.365 kg)  BMI 33.74 kg/m2  SpO2 97% Objective:   Physical Exam  Constitutional: She is oriented to person, place, and time. She appears well-developed. She appears lethargic. She has a sickly appearance. No distress.  HENT:  Head: Normocephalic and atraumatic.  Right Ear: External ear normal.  Left Ear: External ear normal.  Eyes: Conjunctivae are normal. No scleral icterus.  Neck: Normal range of motion. Neck supple. No thyromegaly present.  Cardiovascular: Normal rate, regular rhythm, normal heart sounds and intact distal pulses.   Pulmonary/Chest: Effort normal and breath sounds normal. No respiratory distress.  Musculoskeletal: She exhibits edema and tenderness.  Lymphadenopathy:    She has no cervical adenopathy.  Neurological: She is oriented to person, place, and time. She appears lethargic. She displays atrophy and tremor. Coordination and gait abnormal.  Gait slow and dragging with rolling walker  Skin: Skin is warm and dry. She is not diaphoretic. No erythema.  Psychiatric: Judgment and thought content normal. She is slowed. Cognition and memory are normal. She exhibits a depressed mood.      Assessment & Plan:  Need for prophylactic vaccination and inoculation against influenza - Plan: Flu Vaccine QUAD 36+ mos IM Chronic back pain and debility - Will try writing a letter to Dr. Newell Coral on pt's behalf as I do think her chronic pain and resultant polypharmacy is behind 90% of her medical problems and pt does seem to be worsening.  Tremor - restart propranolol 120mg  qd  Edema - restart lasix 40 qd (with K) down from bid prev.  Recheck 3 wks.

## 2013-03-09 ENCOUNTER — Encounter: Payer: Self-pay | Admitting: Family Medicine

## 2013-03-09 ENCOUNTER — Ambulatory Visit: Payer: Self-pay | Admitting: Family Medicine

## 2013-03-09 VITALS — BP 106/64 | HR 72 | Temp 98.5°F | Resp 16 | Ht 63.0 in | Wt 190.0 lb

## 2013-03-09 DIAGNOSIS — N189 Chronic kidney disease, unspecified: Secondary | ICD-10-CM

## 2013-03-09 DIAGNOSIS — Z9181 History of falling: Secondary | ICD-10-CM

## 2013-03-09 DIAGNOSIS — R42 Dizziness and giddiness: Secondary | ICD-10-CM

## 2013-03-09 LAB — BASIC METABOLIC PANEL
BUN: 31 mg/dL — ABNORMAL HIGH (ref 6–23)
Creat: 1.49 mg/dL — ABNORMAL HIGH (ref 0.50–1.10)
Sodium: 139 mEq/L (ref 135–145)

## 2013-03-09 NOTE — Patient Instructions (Signed)
There is a possibility that the propranolol is making you lightheaded or dizzy. However, it also may be helping your tremor.  If you would like to go off of it for a few days and see if your lightheadedness is getting better, that would be a good idea.  If then your tremor gets worse off of it, we can always retry it at a lower dose such as a 60mg  or 80mg  pill rather than a 120mg  pill which you are on currently.  Please call the office in a few days or email in through MyChart and let me know if your lightheadedness has gotten better and your tremor has gotten worse off the propranolol.

## 2013-03-09 NOTE — Progress Notes (Signed)
Subjective:    Patient ID: Jennifer Davidson, female    DOB: 02/25/1956, 57 y.o.   MRN: [id number] Chief Complaint  Patient presents with  . Follow-up    DEHYDRATION   HPI  Her appetite is doing much better. She is still getting a protein shake in every day and has still been drinking gatorade rather than tea or diet dr. Reino Kent (her prior favorites).  It is almost back to normal appetite but not quite.  Nml GI/GU.  Her tremors improved sig since we restarted her propranolol but it has not made any difference in her anxiety levels.  She does have episodes of lightheadedness 2-3s/d.    Yesterday she was using her cane instead of her walker and her dog ran right in front of her - she thinks she must have tripped over him. She fell forward and hit the ground so hard she actually vomited. She is having some pain in her right knee and her left shoulder now as well as her left hand but doesn't think she did any damage beyond bruising nml aches.  Past Medical History  Diagnosis Date  . Hypertension   . Cholesterol serum increased   . Thyroid disease   . Sleep apnea   . Migraine   . Depression   . Neuromuscular disorder   . GERD (gastroesophageal reflux disease)   . Anxiety    Current Outpatient Prescriptions on File Prior to Visit  Medication Sig Dispense Refill  . amitriptyline (ELAVIL) 25 MG tablet Take 1 tablet (25 mg total) by mouth at bedtime.  90 tablet  0  . azelastine (ASTELIN) 137 MCG/SPRAY nasal spray Place 2 sprays into the nose 2 (two) times daily. Use in each nostril as directed  30 mL  3  . citalopram (CELEXA) 40 MG tablet TAKE 1 TABLET (40 MG TOTAL) BY MOUTH DAILY.  90 tablet  1  . clonazePAM (KLONOPIN) 0.5 MG tablet TAKE 1 TABLET BY MOUTH twice A DAY AS NEEDED FOR ANXIETY. Can take 2 tabs at night for insomnia  120 tablet  1  . fenofibrate (TRICOR) 145 MG tablet TAKE 1 TABLET (145 MG TOTAL) BY MOUTH DAILY.  90 tablet  0  . fluticasone (FLONASE) 50 MCG/ACT nasal spray Place 2 sprays  into the nose at bedtime.  16 g  6  . furosemide (LASIX) 40 MG tablet TAKE 1 TABLET TWICE A DAY  180 tablet  1  . gabapentin (NEURONTIN) 300 MG capsule Take 300 mg by mouth 3 (three) times daily.       Marland Kitchen HYDROmorphone HCl (EXALGO) 16 MG T24A Take by mouth 2 (two) times daily before lunch and supper.      . levothyroxine (SYNTHROID, LEVOTHROID) 50 MCG tablet Take 1 tablet (50 mcg total) by mouth daily.  90 tablet  1  . potassium chloride SA (KLOR-CON M20) 20 MEQ tablet TAKE 2 TABLETS BY MOUTH 2 TIMES DAILY.  360 tablet  0  . propranolol ER (INDERAL LA) 120 MG 24 hr capsule TAKE 1 CAPSULE (120 MG TOTAL) BY MOUTH DAILY.  90 capsule  1  . rosuvastatin (CRESTOR) 20 MG tablet Take 1 tablet (20 mg total) by mouth at bedtime.  90 tablet  1  . SUMAtriptan (IMITREX) 50 MG tablet Take 1 tablet (50 mg total) by mouth every 2 (two) hours as needed for migraine.  10 tablet  3  . topiramate (TOPAMAX) 100 MG tablet Take 1 tablet (100 mg total) by mouth at bedtime.  90 tablet  3   No current facility-administered medications on file prior to visit.   No Known Allergies   Review of Systems  Constitutional: Positive for fatigue. Negative for fever, chills, diaphoresis and appetite change.  Eyes: Negative for visual disturbance.  Respiratory: Negative for cough and shortness of breath.   Cardiovascular: Negative for chest pain, palpitations and leg swelling.  Genitourinary: Negative for decreased urine volume.  Neurological: Positive for dizziness, weakness and light-headedness. Negative for tremors and syncope.  Hematological: Does not bruise/bleed easily.  Psychiatric/Behavioral: Positive for dysphoric mood. Negative for sleep disturbance. The patient is nervous/anxious.       BP 106/64  Pulse 72  Temp(Src) 98.5 F (36.9 C) (Oral)  Resp 16  Ht 5\' 3"  (1.6 m)  Wt 190 lb (86.183 kg)  BMI 33.67 kg/m2  SpO2 96% Objective:   Physical Exam  Constitutional: She is oriented to person, place, and time. She  appears well-developed and well-nourished. No distress.  HENT:  Head: Normocephalic and atraumatic.  Right Ear: External ear normal.  Left Ear: External ear normal.  Eyes: Conjunctivae are normal. No scleral icterus.  Neck: Normal range of motion. Neck supple. No thyromegaly present.  Cardiovascular: Normal rate, regular rhythm, normal heart sounds and intact distal pulses.   Pulmonary/Chest: Effort normal and breath sounds normal. No respiratory distress.  Musculoskeletal: She exhibits no edema.  Lymphadenopathy:    She has no cervical adenopathy.  Neurological: She is alert and oriented to person, place, and time. Gait abnormal.  Walks w/ walker, slow shuffling gait.  Skin: Skin is warm and dry. She is not diaphoretic. No erythema.  Psychiatric: She has a normal mood and affect. Her behavior is normal.      Assessment & Plan:  Chronic kidney disease - Plan: Basic metabolic panel - recheck since we restarted lasix 3 wks prior.  Lightheadedness- likely due to mult pain meds but pt is also borderline orthostatic - she will try to go off her propranolol since this is not helping her anxiety at all and see if it makes a difference. I do suspect it is helping her tremors a lot so we might want to consider restarting a lower dose of propranolol for her tremors.  At high risk for falls  No orders of the defined types were placed in this encounter.    Norberto Sorenson, MD MPH

## 2013-04-09 ENCOUNTER — Other Ambulatory Visit: Payer: Self-pay | Admitting: Family Medicine

## 2013-04-22 ENCOUNTER — Ambulatory Visit: Payer: Self-pay | Admitting: Family Medicine

## 2013-04-22 VITALS — BP 102/72 | HR 80 | Temp 98.6°F | Resp 18 | Wt 206.0 lb

## 2013-04-22 DIAGNOSIS — J329 Chronic sinusitis, unspecified: Secondary | ICD-10-CM

## 2013-04-22 MED ORDER — LEVOFLOXACIN 500 MG PO TABS: 500.0000 mg | ORAL_TABLET | Freq: Every day | ORAL | Status: DC

## 2013-04-22 NOTE — Patient Instructions (Signed)

## 2013-04-22 NOTE — Progress Notes (Signed)
Subjective:  This chart was scribed for [name], MD by Carl Best, Medical Scribe. This patient was seen in Room 9 and the patient's care was started at 9:21 AM.  Patient ID: Jennifer Davidson, female    DOB: 1956/01/16, 57 y.o.   MRN: [id number]  HPI HPI Comments: Jennifer Davidson is a 57 y.o. female with a history of Hypertension, chronic kidney disease, and chronic back problems who presents to the [facility] complaining of ear pain, dysphagia, and nasal congestion that started three days ago.  The patient states that she had a subjective fever late Friday night, cough, postnasal drip, sore throat, and emesis as associated symptoms.  She states that she has used Vick's and Flonase with no relief to her symptoms.  She states that she had an accident and had back surgery.  She states that she had complications after the surgery and was never approved for a second opinion or second surgery.   She states that Dr. Bettina Gavia refuses to treat her.  The patient denies having a history of allergies.  The patient states that she still takes the Celexa and Amitriptyline daily.  She denies taking Elavil.  The patient's PCP is Dr. [name].  The patient states that her pain is managed by Dr. Meryl Dare.  The patient states that she works at the [employer].  The patient states that she 4 grandchildren ages 57, 61, 88, and 24 years of age.    Past Medical History  Diagnosis Date   Hypertension    Cholesterol serum increased    Thyroid disease    Sleep apnea    Migraine    Depression    Neuromuscular disorder    GERD (gastroesophageal reflux disease)    Anxiety    Past Surgical History  Procedure Laterality Date   Abdominal hysterectomy     Back surgery     Appendectomy     Family History  Problem Relation Age of Onset   Diabetes Mother    Heart disease Mother    Kidney disease Mother    Thyroid disease Mother    Heart disease Father    Seizures Father     COPD Father    Cancer Maternal Grandmother    History   Social History   Marital Status: Married    Spouse Name: N/A    Number of Children: N/A   Years of Education: N/A   Occupational History   Not on file.   Social History Main Topics   Smoking status: Never Smoker    Smokeless tobacco: Never Used   Alcohol Use: No   Drug Use: No   Sexual Activity: Yes    Partners: Male     Comment: married   Other Topics Concern   Not on file   Social History Narrative   No narrative on file   No Known Allergies   Review of Systems  Constitutional: Positive for fever (subjective).  HENT: Positive for congestion, ear pain, postnasal drip and trouble swallowing.   Respiratory: Positive for cough.   Gastrointestinal: Positive for vomiting.  All other systems reviewed and are negative.      Objective:  Physical Exam No acute distress, nasal voice, patient using cane, patient seen with husband HEENT: Marked mucopurulent foamy discharge in nose bilaterally. Mild postnasal drainage. Neck: Supple no adenopathy, thyromegaly Chest: Few rhonchi otherwise clear with good breath sounds bilaterally Heart: Regular no murmur gallop or rub   Assessment &  Plan:  Sinusitis - Plan: levofloxacin (LEVAQUIN) 500 MG tablet  Signed, [name], MD   I personally performed the services described in this documentation, which was scribed in my presence. The recorded information has been reviewed and is accurate.

## 2013-04-28 ENCOUNTER — Other Ambulatory Visit: Payer: Self-pay | Admitting: Family Medicine

## 2013-04-30 ENCOUNTER — Other Ambulatory Visit: Payer: Self-pay | Admitting: Family Medicine

## 2013-05-01 ENCOUNTER — Ambulatory Visit: Payer: Self-pay | Admitting: Family Medicine

## 2013-05-01 ENCOUNTER — Ambulatory Visit: Payer: Self-pay

## 2013-05-01 VITALS — BP 128/82 | HR 117 | Temp 97.8°F | Resp 17 | Wt 195.0 lb

## 2013-05-01 DIAGNOSIS — R609 Edema, unspecified: Secondary | ICD-10-CM

## 2013-05-01 DIAGNOSIS — R0609 Other forms of dyspnea: Secondary | ICD-10-CM

## 2013-05-01 DIAGNOSIS — M25569 Pain in unspecified knee: Secondary | ICD-10-CM

## 2013-05-01 DIAGNOSIS — R05 Cough: Secondary | ICD-10-CM

## 2013-05-01 DIAGNOSIS — R0600 Dyspnea, unspecified: Secondary | ICD-10-CM

## 2013-05-01 DIAGNOSIS — R059 Cough, unspecified: Secondary | ICD-10-CM

## 2013-05-01 LAB — POCT CBC
Granulocyte percent: 75.4 %G (ref 37–80)
HCT, POC: 40 % (ref 37.7–47.9)
Hemoglobin: 12 g/dL — AB (ref 12.2–16.2)
Lymph, poc: 2.7 (ref 0.6–3.4)
MCH, POC: 28.2 pg (ref 27–31.2)
MCHC: 30 g/dL — AB (ref 31.8–35.4)
MCV: 93.9 fL (ref 80–97)
MID (cbc): 0.7 (ref 0–0.9)
MPV: 7.1 fL (ref 0–99.8)
POC Granulocyte: 1006 — AB (ref 2–6.9)
POC LYMPH PERCENT: 19.4 %L (ref 10–50)
POC MID %: 5.2 %M (ref 0–12)
Platelet Count, POC: 318 10*3/uL (ref 142–424)
RBC: 4.26 M/uL (ref 4.04–5.48)
RDW, POC: 15 %
WBC: 14.1 10*3/uL — AB (ref 4.6–10.2)

## 2013-05-01 MED ORDER — AMOXICILLIN 875 MG PO TABS: 875.0000 mg | ORAL_TABLET | Freq: Two times a day (BID) | ORAL | Status: DC

## 2013-05-01 MED ORDER — HYDROCODONE-HOMATROPINE 5-1.5 MG/5ML PO SYRP: 5.0000 mL | ORAL_SOLUTION | Freq: Three times a day (TID) | ORAL | Status: DC | PRN

## 2013-05-01 NOTE — Progress Notes (Signed)
57 yo woman with h/o back surgery and back pain who comes in with persistent cough, leg pains with edema, and shortness of breath.  She has lost 11 pounds in last week because she has "lost my appetite.'  Objective: NAD Chest:  Very congested cough, no wheezes or rales heard Heart: rapid with early systolic murmur HEENT: unremarkable Neck no JVD Ext:  2+ pretibial and foot edema with fine petechial rash  [facility] reading (PRIMARY) by  Dr. Milus Glazier.  CXR:  NAD  Results for orders placed in visit on 05/01/13  POCT CBC      Result Value Range   WBC 14.1 (*) 4.6 - 10.2 K/uL   Lymph, poc 2.7  0.6 - 3.4   POC LYMPH PERCENT 19.4  10 - 50 %L   MID (cbc) 0.7  0 - 0.9   POC MID % 5.2  0 - 12 %M   POC Granulocyte 1006 (*) 2 - 6.9   Granulocyte percent 75.4  37 - 80 %G   RBC 4.26  4.04 - 5.48 M/uL   Hemoglobin 12.0 (*) 12.2 - 16.2 g/dL   HCT, POC 16.1  09.6 - 47.9 %   MCV 93.9  80 - 97 fL   MCH, POC 28.2  27 - 31.2 pg   MCHC 30.0 (*) 31.8 - 35.4 g/dL   RDW, POC 04.5     Platelet Count, POC 318  142 - 424 K/uL   MPV 7.1  0 - 99.8 fL   Cough - Plan: DG Chest 2 View, POCT CBC, Comprehensive metabolic panel, Lower Extremity Venous Duplex Bilateral  Edema - Plan: Lower Extremity Venous Duplex Bilateral  Pain in joint, lower leg, unspecified laterality - Plan: Lower Extremity Venous Duplex Bilateral  Signed, [name], MD

## 2013-05-02 ENCOUNTER — Telehealth: Payer: Self-pay

## 2013-05-02 DIAGNOSIS — M7989 Other specified soft tissue disorders: Secondary | ICD-10-CM

## 2013-05-02 LAB — COMPREHENSIVE METABOLIC PANEL
ALT: 16 U/L (ref 0–35)
AST: 23 U/L (ref 0–37)
Albumin: 4.1 g/dL (ref 3.5–5.2)
Alkaline Phosphatase: 93 U/L (ref 39–117)
BUN: 17 mg/dL (ref 6–23)
CO2: 25 mEq/L (ref 19–32)
Calcium: 9.4 mg/dL (ref 8.4–10.5)
Chloride: 100 mEq/L (ref 96–112)
Creat: 1.61 mg/dL — ABNORMAL HIGH (ref 0.50–1.10)
Glucose, Bld: 87 mg/dL (ref 70–99)
Potassium: 4.3 mEq/L (ref 3.5–5.3)
Sodium: 137 mEq/L (ref 135–145)
Total Bilirubin: 0.4 mg/dL (ref 0.3–1.2)
Total Protein: 8.4 g/dL — ABNORMAL HIGH (ref 6.0–8.3)

## 2013-05-02 NOTE — Telephone Encounter (Signed)
Order changed.

## 2013-05-02 NOTE — Telephone Encounter (Signed)
ROBERTA FROM [facility] STATES THEY HAVE BEEN TRYING TO GET IN TOUCH WITH WHOMEVER MADE THE APPT FOR PT, IT WAS PUT IN THE WRONG ORDER AND NEED TO BE CHANGED SO THEY CAN MAKE THE APPT FOR THE V[facility]US ULTRASOUND. IT IS REALLY IMPORTANT THIS IS DONE BEFORE 2:00 TODAY. SHE IS GOING TO CONTINUE CHECKING THE ORDER FOR IT TO BE DONE YOU MAY CALL HER AT [phone]

## 2013-05-03 ENCOUNTER — Ambulatory Visit
Admission: RE | Admit: 2013-05-03 | Discharge: 2013-05-03 | Disposition: A | Discharge status: Home / Self Care | Payer: No Typology Code available for payment source | Source: Ambulatory Visit | Attending: Family Medicine | Admitting: Family Medicine

## 2013-05-03 DIAGNOSIS — M7989 Other specified soft tissue disorders: Secondary | ICD-10-CM

## 2013-05-04 ENCOUNTER — Telehealth: Payer: Self-pay

## 2013-05-04 NOTE — Telephone Encounter (Signed)
Patient wants to know the results of the test she had yesterday on her legs.   She gave the phone number of [phone]  (different from the number she called from [phone] AND different from the cell number in the system.)

## 2013-05-04 NOTE — Telephone Encounter (Signed)
No blood clot. Advised her of the findings she does have bakers cyst of the right knee, advised her of this also.

## 2013-05-08 ENCOUNTER — Telehealth: Payer: Self-pay

## 2013-05-08 NOTE — Telephone Encounter (Signed)
Patient need to speak with Dr. [name] about swelling of legs, please send information about recent visit to Dr. Conception Oms at [facility]

## 2013-05-09 NOTE — Telephone Encounter (Signed)
Spoke to pt. Her swelling in her legs has increased. She is having SOB while supine. Some shooting pains in her chest but they do resolve on their own. She states her abd has begun to swell but will go down as she rests. Strongly advised pt to come in to the clinic today. She declines stating no ride. Asked her to call 911. Pt declined. She states she will be in on Friday to see Dr. [name]. Advised pt again to go the the ED.

## 2013-05-09 NOTE — Telephone Encounter (Signed)
Try increasing her lasix and potassium to twice a day from once a day. Push fluids - water and gatorade, avoid salt, elevate legs. Try to drink protein shakes even if she is not feeling like eating anything. As long as she is not worsening, ok to wait until Fri but sooner if worsening - esp considering recently infection. Also, we need to check and make sure she is not having another kidney/urine infection asap.

## 2013-05-09 NOTE — Telephone Encounter (Signed)
Advised pt medication instructions. She will be in on Friday. Advised again that if she begins to feel worse she is to call 911. Pt understands.

## 2013-05-11 ENCOUNTER — Ambulatory Visit: Payer: Self-pay | Admitting: Family Medicine

## 2013-05-11 VITALS — BP 140/90 | HR 99 | Temp 98.0°F | Resp 16 | Ht 63.5 in | Wt 198.0 lb

## 2013-05-11 DIAGNOSIS — R5383 Other fatigue: Secondary | ICD-10-CM

## 2013-05-11 DIAGNOSIS — R5381 Other malaise: Secondary | ICD-10-CM

## 2013-05-11 DIAGNOSIS — R05 Cough: Secondary | ICD-10-CM

## 2013-05-11 LAB — POCT URINALYSIS DIPSTICK
Bilirubin, UA: NEGATIVE
Blood, UA: NEGATIVE
Ketones, UA: NEGATIVE
Leukocytes, UA: NEGATIVE
Protein, UA: NEGATIVE
Spec Grav, UA: 1.015
pH, UA: 7

## 2013-05-11 LAB — POCT UA - MICROSCOPIC ONLY
Bacteria, U Microscopic: NEGATIVE
Crystals, Ur, HPF, POC: NEGATIVE
Mucus, UA: NEGATIVE
WBC, Ur, HPF, POC: NEGATIVE

## 2013-05-11 LAB — BASIC METABOLIC PANEL
CO2: 25 mEq/L (ref 19–32)
Calcium: 10 mg/dL (ref 8.4–10.5)
Potassium: 5.3 mEq/L (ref 3.5–5.3)
Sodium: 135 mEq/L (ref 135–145)

## 2013-05-11 LAB — POCT CBC
Granulocyte percent: 53 %G (ref 37–80)
Hemoglobin: 12.5 g/dL (ref 12.2–16.2)
Lymph, poc: 2.6 (ref 0.6–3.4)
MCH, POC: 28.3 pg (ref 27–31.2)
MCHC: 30.2 g/dL — AB (ref 31.8–35.4)
MPV: 6.5 fL (ref 0–99.8)
POC Granulocyte: 3.6 (ref 2–6.9)
POC LYMPH PERCENT: 38.8 %L (ref 10–50)
POC MID %: 8.2 %M (ref 0–12)
RDW, POC: 14.6 %

## 2013-05-11 MED ORDER — AZITHROMYCIN 250 MG PO TABS: ORAL_TABLET | ORAL | Status: DC

## 2013-05-11 MED ORDER — FLUCONAZOLE 150 MG PO TABS: 150.0000 mg | ORAL_TABLET | Freq: Once | ORAL | Status: DC

## 2013-05-11 NOTE — Progress Notes (Addendum)
Subjective:    Patient ID: Jennifer Davidson, female    DOB: [birth year], 57 y.o.   MRN: [id number]  HPI  This chart was scribed for Jennifer Mocha, MD, by Ellin Mayhew, ED Scribe. This patient was seen in room 8 and the patient's care was started at 9:47 AM.  Chief Complaint  Patient presents with  . Cough    x 1 month  . Retaining fluid    x 2.5 weeks   HPI Comments: Jennifer Davidson is a 57 y.o. Female, with a history of chronic renal failure, recurrent pyelonephritis, back pain, and debility who presents to the [facility] with her sister complaining of a productive cough with green sputum that has been ongoing for one month. She was seen by Dr. Milus Glazier three weeks ago for a sinus infection and she was prescribed Levaquin. She was then seen 10 days ago for a persistent cough , SOB, and lower extremity edema. A lower extremity US was done to rule out DVT and she was prescribed amoxicillin. At her visit 10 days ago, she did present with leukocytosis with a left shift and her renal function was close to baseline.   Today, she reports having chills but no fevers and is still feeling sick. She reports a feeling of post-nasal drainage and nausea with no vomiting. She has experienced a loss of appetite; however, she reports feeling bloated when does eat and describes it as a "tightness in the upper regions of her adbdomen". She has been having normal BMs. She reports no GU symtpoms.   The patient also presents with lower leg swelling of varying levels of severity. The patient reports that she can feel her legs swell up if they hang off a ledge or furniture. She confirms that she has been drinking 6-8 glasses of water per day. She states that after taking Lasix, the swelling has partially reduced.  Past Medical History  Diagnosis Date  . Hypertension   . Cholesterol serum increased   . Thyroid disease   . Sleep apnea   . Migraine   . Depression   . Neuromuscular disorder   . GERD  (gastroesophageal reflux disease)   . Anxiety     Past Surgical History  Procedure Laterality Date  . Abdominal hysterectomy    . Back surgery    . Appendectomy      Family History  Problem Relation Age of Onset  . Diabetes Mother   . Heart disease Mother   . Kidney disease Mother   . Thyroid disease Mother   . Heart disease Father   . Seizures Father   . COPD Father   . Cancer Maternal Grandmother     History   Social History  . Marital Status: Married    Spouse Name: N/A    Number of Children: N/A  . Years of Education: N/A   Occupational History  . Not on file.   Social History Main Topics  . Smoking status: Never Smoker   . Smokeless tobacco: Never Used  . Alcohol Use: No  . Drug Use: No  . Sexual Activity: Yes    Partners: Male     Comment: married   Other Topics Concern  . Not on file   Social History Narrative  . No narrative on file   No Known Allergies  Current Outpatient Prescriptions on File Prior to Visit  Medication Sig Dispense Refill  . amitriptyline (ELAVIL) 25 MG tablet TAKE 1  TABLET (25 MG TOTAL) BY MOUTH AT BEDTIME.  90 tablet  0  . citalopram (CELEXA) 40 MG tablet TAKE 1 TABLET (40 MG TOTAL) BY MOUTH DAILY.  90 tablet  0  . fenofibrate (TRICOR) 145 MG tablet TAKE 1 TABLET (145 MG TOTAL) BY MOUTH DAILY.  90 tablet  0  . fluticasone (FLONASE) 50 MCG/ACT nasal spray Place 2 sprays into the nose at bedtime.  16 g  6  . furosemide (LASIX) 40 MG tablet TAKE 1 TABLET TWICE A DAY  180 tablet  1  . gabapentin (NEURONTIN) 300 MG capsule Take 300 mg by mouth 4 (four) times daily.       Marland Kitchen HYDROcodone-homatropine (HYCODAN) 5-1.5 MG/5ML syrup Take 5 mLs by mouth every 8 (eight) hours as needed for cough.  120 mL  0  . levothyroxine (SYNTHROID, LEVOTHROID) 50 MCG tablet Take 1 tablet (50 mcg total) by mouth daily.  90 tablet  1  . potassium chloride SA (KLOR-CON M20) 20 MEQ tablet TAKE 2 TABLETS BY MOUTH 2 TIMES DAILY.  360 tablet  0  . SUMAtriptan  (IMITREX) 50 MG tablet Take 1 tablet (50 mg total) by mouth every 2 (two) hours as needed for migraine.  10 tablet  3  . topiramate (TOPAMAX) 100 MG tablet Take 1 tablet (100 mg total) by mouth at bedtime.  90 tablet  3   No current facility-administered medications on file prior to visit.    Review of Systems  Constitutional: Positive for chills, appetite change and unexpected weight change. Negative for fever and activity change.  HENT: Positive for congestion. Negative for ear pain and voice change.   Respiratory: Positive for cough. Negative for shortness of breath and wheezing.   Cardiovascular: Positive for leg swelling.  Gastrointestinal: Positive for nausea, abdominal pain and abdominal distention. Negative for vomiting, diarrhea and constipation.  Genitourinary: Negative for dysuria, hematuria and difficulty urinating.  Musculoskeletal: Negative for back pain and myalgias.  Neurological: Negative for dizziness, weakness and headaches.     BP 140/90  Pulse 99  Temp(Src) 98 F (36.7 C) (Oral)  Resp 16  Ht 5' 3.5" (1.613 m)  Wt 198 lb (89.812 kg)  BMI 34.52 kg/m2  SpO2 99% Objective:   Physical Exam  Nursing note and vitals reviewed. Constitutional: She is oriented to person, place, and time. She appears well-developed and well-nourished. No distress.  HENT:  Head: Normocephalic and atraumatic.  Left Ear: Tympanic membrane is not erythematous. A middle ear effusion (Large ) is present.  Nose: Mucosal edema present.  Mouth/Throat: Mucous membranes are dry.  Eyes: EOM are normal.  Neck: Neck supple. No tracheal deviation present. No mass and no thyromegaly present.  Cardiovascular: Normal rate, regular rhythm and normal heart sounds.   Pulmonary/Chest: Effort normal. No respiratory distress.  Abdominal: Soft. Bowel sounds are normal. She exhibits no distension and no mass. There is no hepatosplenomegaly, splenomegaly or hepatomegaly. There is tenderness ( ) in the suprapubic  area and left upper quadrant.  Musculoskeletal: Normal range of motion.  Lymphadenopathy:    She has no cervical adenopathy.    She has no axillary adenopathy.       Right: No supraclavicular adenopathy present.       Left: No supraclavicular adenopathy present.  Neurological: She is alert and oriented to person, place, and time.  Skin: Skin is warm and dry.  Psychiatric: She has a normal mood and affect. Her behavior is normal.   Results for orders placed in visit  on 05/11/13  BASIC METABOLIC PANEL      Result Value Range   Sodium 135  135 - 145 mEq/L   Potassium 5.3  3.5 - 5.3 mEq/L   Chloride 102  96 - 112 mEq/L   CO2 25  19 - 32 mEq/L   Glucose, Bld 92  70 - 99 mg/dL   BUN 18  6 - 23 mg/dL   Creat 4.09 (*) 8.11 - 1.10 mg/dL   Calcium 91.4  8.4 - 78.2 mg/dL  POCT URINALYSIS DIPSTICK      Result Value Range   Color, UA yellow     Clarity, UA clear     Glucose, UA neg     Bilirubin, UA neg     Ketones, UA neg     Spec Grav, UA 1.015     Blood, UA neg     pH, UA 7.0     Protein, UA neg     Urobilinogen, UA 0.2     Nitrite, UA neg     Leukocytes, UA Negative    POCT UA - MICROSCOPIC ONLY      Result Value Range   WBC, Ur, HPF, POC neg     RBC, urine, microscopic 0-1     Bacteria, U Microscopic neg     Mucus, UA neg     Epithelial cells, urine per micros 0-1     Crystals, Ur, HPF, POC neg     Casts, Ur, LPF, POC neg     Yeast, UA neg    POCT CBC      Result Value Range   WBC 6.7  4.6 - 10.2 K/uL   Lymph, poc 2.6  0.6 - 3.4   POC LYMPH PERCENT 38.8  10 - 50 %L   MID (cbc) 0.5  0 - 0.9   POC MID % 8.2  0 - 12 %M   POC Granulocyte 3.6  2 - 6.9   Granulocyte percent 53.0  37 - 80 %G   RBC 4.42  4.04 - 5.48 M/uL   Hemoglobin 12.5  12.2 - 16.2 g/dL   HCT, POC 95.6  21.3 - 47.9 %   MCV 93.7  80 - 97 fL   MCH, POC 28.3  27 - 31.2 pg   MCHC 30.2 (*) 31.8 - 35.4 g/dL   RDW, POC 08.6     Platelet Count, POC 412  142 - 424 K/uL   MPV 6.5  0 - 99.8 fL   Assessment &  Plan:   Fatigue - Plan: POCT urinalysis dipstick, POCT UA - Microscopic Only  Cough - Plan: POCT CBC, Basic metabolic panel  Meds ordered this encounter  Medications  . fluconazole (DIFLUCAN) 150 MG tablet    Sig: Take 1 tablet (150 mg total) by mouth once. Repeat if needed    Dispense:  1 tablet    Refill:  1  . azithromycin (ZITHROMAX) 250 MG tablet    Sig: Take 2 tabs PO x 1 dose, then 1 tab PO QD x 4 days    Dispense:  6 tablet    Refill:  0    I personally performed the services described in this documentation, which was scribed in my presence. The recorded information has been reviewed and considered, and addended by me as needed.  Norberto Sorenson, MD MPH  FAX OFFICE VISIT NOTES and labs TO DR. Manon Hilding per pt request for coordination of care.

## 2013-05-11 NOTE — Patient Instructions (Signed)
Continue on twice a day lasix and potassium supplement for the next week and then please call into the office to let me know how your are doing, hopefully we can decrease it at that time.  Start the zpack and after the course if complete, take a diflucan (yeast pill.) If in another 3d you are still having any yeast symptoms, go ahead and repeat the diflucan again.

## 2013-05-13 ENCOUNTER — Other Ambulatory Visit: Payer: Self-pay | Admitting: Family Medicine

## 2013-05-17 ENCOUNTER — Telehealth: Payer: Self-pay | Admitting: Radiology

## 2013-05-17 NOTE — Telephone Encounter (Signed)
Patient states [facility] did not get RX she has been advised I will call this in for her, left message for pharmacy to advise to fill Klonopin

## 2013-05-17 NOTE — Telephone Encounter (Signed)
Faxed in

## 2013-06-08 ENCOUNTER — Ambulatory Visit: Payer: Self-pay | Admitting: Family Medicine

## 2013-06-11 ENCOUNTER — Ambulatory Visit: Payer: Self-pay | Admitting: Family Medicine

## 2013-06-11 ENCOUNTER — Encounter: Payer: Self-pay | Admitting: Family Medicine

## 2013-06-11 VITALS — BP 108/70 | HR 68 | Temp 97.7°F | Resp 16 | Ht 62.0 in | Wt 208.6 lb

## 2013-06-11 DIAGNOSIS — E039 Hypothyroidism, unspecified: Secondary | ICD-10-CM

## 2013-06-11 NOTE — Patient Instructions (Signed)
I will be in touch regarding your thyroid results when they come in.  Let's plan to check your TSH again in about 6 months.

## 2013-06-11 NOTE — Progress Notes (Addendum)
[facility] [address], [place] [address] [phone]  Date:  06/11/2013   Name:  Jennifer SantaSusie G Oetken   DOB:  04/07/1956   MRN:  [id number]  PCP:  Norberto SorensonSHAW,EVA, MD    Chief Complaint: Follow-up   History of Present Illness:  Jennifer Davidson is a 58 y.o. very pleasant female patient who presents with the following:  She is here today for her TSH check.  She was sick the last couple of months and was not able to have this test done. She is on 50 mcg of levothyroixine; she has been stable on this does for some time. She has not yet run out and has been able to take it.   Patient Active Problem List   Diagnosis Date Noted  . Diastolic heart failure 03/17/2012  . Syncope and collapse 02/12/2012  . Hypertension 02/04/2012  . Chronic kidney disease 02/04/2012  . Edema 02/04/2012  . Tachycardia 02/04/2012  . Encounter for long-term (current) use of other medications 02/04/2012  . Anxiety and depression 02/04/2012  . Hypothyroid 02/04/2012  . Hyperlipidemia 02/04/2012    Past Medical History  Diagnosis Date  . Hypertension   . Cholesterol serum increased   . Thyroid disease   . Sleep apnea   . Migraine   . Depression   . Neuromuscular disorder   . GERD (gastroesophageal reflux disease)   . Anxiety     Past Surgical History  Procedure Laterality Date  . Abdominal hysterectomy    . Back surgery    . Appendectomy      History  Substance Use Topics  . Smoking status: Never Smoker   . Smokeless tobacco: Never Used  . Alcohol Use: No    Family History  Problem Relation Age of Onset  . Diabetes Mother   . Heart disease Mother   . Kidney disease Mother   . Thyroid disease Mother   . Heart disease Father   . Seizures Father   . COPD Father   . Cancer Maternal Grandmother     No Known Allergies  Medication list has been reviewed and updated.  Current Outpatient Prescriptions on File Prior to Visit  Medication Sig Dispense Refill  . amitriptyline  (ELAVIL) 25 MG tablet TAKE 1 TABLET (25 MG TOTAL) BY MOUTH AT BEDTIME.  90 tablet  0  . azithromycin (ZITHROMAX) 250 MG tablet Take 2 tabs PO x 1 dose, then 1 tab PO QD x 4 days  6 tablet  0  . citalopram (CELEXA) 40 MG tablet TAKE 1 TABLET (40 MG TOTAL) BY MOUTH DAILY.  90 tablet  0  . clonazePAM (KLONOPIN) 0.5 MG tablet TAKE 1 TABLET TWICE A DAY AS NEEDED FOR ANXIETY - CAN TAKE 2 TABS AT BEDTIME FOR INSOMNIA - DUE 8/24  120 tablet  1  . fenofibrate (TRICOR) 145 MG tablet TAKE 1 TABLET (145 MG TOTAL) BY MOUTH DAILY.  90 tablet  0  . fluconazole (DIFLUCAN) 150 MG tablet Take 1 tablet (150 mg total) by mouth once. Repeat if needed  1 tablet  1  . fluticasone (FLONASE) 50 MCG/ACT nasal spray Place 2 sprays into the nose at bedtime.  16 g  6  . furosemide (LASIX) 40 MG tablet TAKE 1 TABLET TWICE A DAY  180 tablet  1  . gabapentin (NEURONTIN) 300 MG capsule Take 300 mg by mouth 4 (four) times daily.       Marland Kitchen. HYDROcodone-homatropine (HYCODAN) 5-1.5 MG/5ML syrup Take  5 mLs by mouth every 8 (eight) hours as needed for cough.  120 mL  0  . levothyroxine (SYNTHROID, LEVOTHROID) 50 MCG tablet Take 1 tablet (50 mcg total) by mouth daily.  90 tablet  1  . potassium chloride SA (KLOR-CON M20) 20 MEQ tablet TAKE 2 TABLETS BY MOUTH 2 TIMES DAILY.  360 tablet  0  . SUMAtriptan (IMITREX) 50 MG tablet Take 1 tablet (50 mg total) by mouth every 2 (two) hours as needed for migraine.  10 tablet  3  . topiramate (TOPAMAX) 100 MG tablet Take 1 tablet (100 mg total) by mouth at bedtime.  90 tablet  3   No current facility-administered medications on file prior to visit.    Review of Systems:  As per HPI- otherwise negative.   Physical Examination: Filed Vitals:   06/11/13 1133  BP: 108/70  Pulse: 68  Temp: 97.7 F (36.5 C)  Resp: 16   Filed Vitals:   06/11/13 1133  Height: 5\' 2"  (1.575 m)  Weight: 208 lb 9.6 oz (94.62 kg)   Body mass index is 38.14 kg/(m^2). Ideal Body Weight: Weight in (lb) to have BMI  = 25: 136.4  GEN: WDWN, NAD, Non-toxic, A & O x 3 HEENT: Atraumatic, Normocephalic. Neck supple. No masses, No LAD. Ears and Nose: No external deformity. CV: RRR, No M/G/R. No JVD. No thrill. No extra heart sounds. PULM: CTA B, no wheezes, crackles, rhonchi. No retractions. No resp. distress. No accessory muscle use. ABD: S, NT, ND, +BS. No rebound. No HSM. EXTR: No c/c/e NEURO Normal gait.  PSYCH: Normally interactive. Conversant. Not depressed or anxious appearing.  Calm demeanor.    Assessment and Plan: Unspecified hypothyroidism - Plan: TSH  Check TSH today. If ok can refill her current dose. Recheck in 6 months   Signed Abbe Amsterdam, MD  addnd 1/27: Results for orders placed in visit on 06/11/13  TSH      Result Value Range   TSH 1.925  0.350 - 4.500 uIU/mL   mychart message to pt, refilled her current dose of synthroid

## 2013-06-12 ENCOUNTER — Other Ambulatory Visit: Payer: Self-pay | Admitting: Family Medicine

## 2013-06-12 DIAGNOSIS — Z1231 Encounter for screening mammogram for malignant neoplasm of breast: Secondary | ICD-10-CM

## 2013-06-12 LAB — TSH: TSH: 1.925 u[IU]/mL (ref 0.350–4.500)

## 2013-06-12 MED ORDER — LEVOTHYROXINE SODIUM 50 MCG PO TABS: 50.0000 ug | ORAL_TABLET | Freq: Every day | ORAL | Status: DC

## 2013-06-12 NOTE — Addendum Note (Signed)
Addended by: Abbe AmsterdamOPLAND, Lindy Pennisi C on: 06/12/2013 08:14 PM   Modules accepted: Orders

## 2013-06-22 ENCOUNTER — Ambulatory Visit (HOSPITAL_COMMUNITY)
Admission: RE | Admit: 2013-06-22 | Discharge: 2013-06-22 | Disposition: A | Discharge status: Home / Self Care | Payer: No Typology Code available for payment source | Source: Ambulatory Visit | Attending: Family Medicine | Admitting: Family Medicine

## 2013-06-22 DIAGNOSIS — Z1231 Encounter for screening mammogram for malignant neoplasm of breast: Secondary | ICD-10-CM | POA: Diagnosis present

## 2013-07-27 ENCOUNTER — Other Ambulatory Visit (HOSPITAL_COMMUNITY): Payer: Self-pay | Admitting: Orthopedic Surgery

## 2013-07-27 DIAGNOSIS — M5410 Radiculopathy, site unspecified: Secondary | ICD-10-CM

## 2013-07-30 ENCOUNTER — Ambulatory Visit (HOSPITAL_COMMUNITY): Payer: Worker's Compensation

## 2013-07-31 ENCOUNTER — Other Ambulatory Visit: Payer: Self-pay | Admitting: Family Medicine

## 2013-08-01 ENCOUNTER — Other Ambulatory Visit (HOSPITAL_COMMUNITY): Payer: Self-pay | Admitting: Orthopedic Surgery

## 2013-08-01 DIAGNOSIS — M545 Low back pain, unspecified: Secondary | ICD-10-CM

## 2013-08-09 ENCOUNTER — Other Ambulatory Visit: Payer: Self-pay | Admitting: Family Medicine

## 2013-08-13 ENCOUNTER — Ambulatory Visit (HOSPITAL_COMMUNITY)
Admission: RE | Admit: 2013-08-13 | Discharge: 2013-08-13 | Disposition: A | Discharge status: Home / Self Care | Payer: PRIVATE HEALTH INSURANCE | Source: Ambulatory Visit | Attending: Orthopedic Surgery | Admitting: Orthopedic Surgery

## 2013-08-13 DIAGNOSIS — IMO0002 Reserved for concepts with insufficient information to code with codable children: Secondary | ICD-10-CM | POA: Insufficient documentation

## 2013-08-13 DIAGNOSIS — M5410 Radiculopathy, site unspecified: Secondary | ICD-10-CM

## 2013-08-13 DIAGNOSIS — M545 Low back pain, unspecified: Secondary | ICD-10-CM

## 2013-08-13 NOTE — Telephone Encounter (Signed)
Called in.

## 2013-09-10 ENCOUNTER — Other Ambulatory Visit: Payer: Self-pay | Admitting: Physician Assistant

## 2013-09-12 NOTE — Telephone Encounter (Signed)
Called in.

## 2013-09-21 DIAGNOSIS — M545 Low back pain, unspecified: Secondary | ICD-10-CM

## 2013-09-21 HISTORY — DX: Low back pain, unspecified: M54.50

## 2013-10-06 ENCOUNTER — Other Ambulatory Visit: Payer: Self-pay | Admitting: Physician Assistant

## 2013-10-12 ENCOUNTER — Telehealth: Payer: Self-pay

## 2013-10-12 DIAGNOSIS — E039 Hypothyroidism, unspecified: Secondary | ICD-10-CM

## 2013-10-12 MED ORDER — LEVOTHYROXINE SODIUM 50 MCG PO TABS: 50.0000 ug | ORAL_TABLET | Freq: Every day | ORAL | Status: DC

## 2013-10-12 NOTE — Telephone Encounter (Signed)
Pt needs a refill on her levothyroxine (SYNTHROID, LEVOTHROID) 50 MCG tablet, said her pharmacy said she needs authorization;[phone]

## 2013-10-12 NOTE — Telephone Encounter (Signed)
LM advised pt 30 day supply has been sent to her pharmacy. Pt should call back to schedule an appt.

## 2013-10-14 ENCOUNTER — Other Ambulatory Visit: Payer: Self-pay | Admitting: Family Medicine

## 2013-10-26 ENCOUNTER — Ambulatory Visit (INDEPENDENT_AMBULATORY_CARE_PROVIDER_SITE_OTHER): Payer: Self-pay | Admitting: Family Medicine

## 2013-10-26 ENCOUNTER — Encounter: Payer: Self-pay | Admitting: Family Medicine

## 2013-10-26 VITALS — BP 136/84 | HR 102 | Temp 98.4°F | Resp 18 | Ht 62.0 in | Wt 206.0 lb

## 2013-10-26 DIAGNOSIS — E785 Hyperlipidemia, unspecified: Secondary | ICD-10-CM

## 2013-10-26 DIAGNOSIS — R609 Edema, unspecified: Secondary | ICD-10-CM

## 2013-10-26 DIAGNOSIS — G43909 Migraine, unspecified, not intractable, without status migrainosus: Secondary | ICD-10-CM

## 2013-10-26 DIAGNOSIS — E039 Hypothyroidism, unspecified: Secondary | ICD-10-CM

## 2013-10-26 DIAGNOSIS — F341 Dysthymic disorder: Secondary | ICD-10-CM

## 2013-10-26 DIAGNOSIS — F329 Major depressive disorder, single episode, unspecified: Secondary | ICD-10-CM

## 2013-10-26 DIAGNOSIS — J069 Acute upper respiratory infection, unspecified: Secondary | ICD-10-CM

## 2013-10-26 DIAGNOSIS — I10 Essential (primary) hypertension: Secondary | ICD-10-CM

## 2013-10-26 DIAGNOSIS — Z79899 Other long term (current) drug therapy: Secondary | ICD-10-CM

## 2013-10-26 DIAGNOSIS — F419 Anxiety disorder, unspecified: Principal | ICD-10-CM

## 2013-10-26 DIAGNOSIS — N189 Chronic kidney disease, unspecified: Secondary | ICD-10-CM

## 2013-10-26 DIAGNOSIS — M545 Low back pain, unspecified: Secondary | ICD-10-CM

## 2013-10-26 MED ORDER — POTASSIUM CHLORIDE CRYS ER 20 MEQ PO TBCR: 20.0000 meq | EXTENDED_RELEASE_TABLET | Freq: Two times a day (BID) | ORAL | Status: DC

## 2013-10-26 MED ORDER — CITALOPRAM HYDROBROMIDE 40 MG PO TABS: ORAL_TABLET | ORAL | Status: DC

## 2013-10-26 MED ORDER — SUMATRIPTAN SUCCINATE 50 MG PO TABS: 50.0000 mg | ORAL_TABLET | ORAL | Status: DC | PRN

## 2013-10-26 MED ORDER — FLUTICASONE PROPIONATE 50 MCG/ACT NA SUSP: 2.0000 | Freq: Every day | NASAL | Status: DC

## 2013-10-26 MED ORDER — CLONAZEPAM 0.5 MG PO TABS: ORAL_TABLET | ORAL | Status: DC

## 2013-10-26 MED ORDER — TOPIRAMATE 100 MG PO TABS: 100.0000 mg | ORAL_TABLET | Freq: Every day | ORAL | Status: DC

## 2013-10-26 MED ORDER — FUROSEMIDE 40 MG PO TABS: 40.0000 mg | ORAL_TABLET | Freq: Two times a day (BID) | ORAL | Status: DC

## 2013-10-26 MED ORDER — LEVOTHYROXINE SODIUM 50 MCG PO TABS: 50.0000 ug | ORAL_TABLET | Freq: Every day | ORAL | Status: DC

## 2013-10-26 NOTE — Patient Instructions (Signed)
At your next visit in 6 months we will want to recheck your thyroid, kidneys, liver, and salts in your blood as well as your cholesterol so please be FASTING at that time.  The orange card program is sponsored by the [facility]. Call [phone] to see if this might be something for which you are eligible. This can ensure you have access to the medical and dental services that [facility] help provide to people without health insurance.

## 2013-10-26 NOTE — Progress Notes (Deleted)
 Subjective:    Patient ID: Jennifer Davidson, female    DOB: 08/27/1955, 58 y.o.   MRN: [id number] This chart was scribed for Jennifer N Shaw, MD by Jennifer Davidson, ED Scribe. This patient was seen in room 29 and the patient's care was started at 3:08 PM.  Chief Complaint  Patient presents with  . Medication Refill    HPI Jennifer Davidson is a 58 y.o. female Pt presents for a medication refill.   Pt reports feeling well overall except for her back and her hip. She sees Dr. Bodea for pain management. She denies any changes with him. She reports being cleared to see [facility] where they discovered another ruptured disc. She reports they are wanting to do an injection, but worker's comp is still being processed.   She reports her anxiety has improved some, but still reports intermittent episodes of anxiety.   She denies urinary incontinence, dysuria, other urinary symptoms. She denies constipation, diarrhea, and other bowel symptoms. She reports having trouble keeping her weight off. She states she has tried changing her diet, without results. She reports being on Lasix. She reports intermittent right LE swelling from the top of her leg to the bottom. She sometimes sleeps with a compression sock on her left leg.   She reports an occasional headache in the mornings. She states she has been drinking plenty of fluids.   She had TSH checked Winter of 2014-2015.   Patient Active Problem List   Diagnosis Date Noted  . Lower back pain 09/21/2013  . Diastolic heart failure 03/17/2012  . Syncope and collapse 02/12/2012  . Hypertension 02/04/2012  . Chronic kidney disease 02/04/2012  . Edema 02/04/2012  . Tachycardia 02/04/2012  . Encounter for long-term (current) use of other medications 02/04/2012  . Anxiety and depression 02/04/2012  . Hypothyroid 02/04/2012  . Hyperlipidemia 02/04/2012   Past Medical History  Diagnosis Date  . Hypertension   . Cholesterol serum increased   . Thyroid  disease   . Sleep apnea   . Migraine   . Depression   . Neuromuscular disorder   . GERD (gastroesophageal reflux disease)   . Anxiety    Past Surgical History  Procedure Laterality Date  . Abdominal hysterectomy    . Back surgery    . Appendectomy     No Known Allergies Prior to Admission medications   Medication Sig Start Date End Date Taking? Authorizing Provider  amitriptyline (ELAVIL) 25 MG tablet TAKE 1 TABLET (25 MG TOTAL) BY MOUTH AT BEDTIME. 04/28/13   Jennifer N Shaw, MD  azithromycin (ZITHROMAX) 250 MG tablet Take 2 tabs PO x 1 dose, then 1 tab PO QD x 4 days 05/11/13   Jennifer N Shaw, MD  citalopram (CELEXA) 40 MG tablet TAKE 1 TABLET (40 MG TOTAL) BY MOUTH DAILY. 07/31/13   Jennifer L Weber, [name]  clonazePAM (KLONOPIN) 0.5 MG tablet TAKE 1 TABLET BY MOUTH TWICE A DAY AS NEEDED FOR ANXIETY CAN TAKE 2 TABLETS AT BEDTIME FOR INSOMNIA    Jennifer N Shaw, MD  fenofibrate (TRICOR) 145 MG tablet TAKE 1 TABLET (145 MG TOTAL) BY MOUTH DAILY. 12/25/12   Jennifer M Dunn, [name]  fluconazole (DIFLUCAN) 150 MG tablet Take 1 tablet (150 mg total) by mouth once. Repeat if needed 05/11/13   Jennifer N Shaw, MD  fluticasone (FLONASE) 50 MCG/ACT nasal spray Place 2 sprays into the nose at bedtime. 06/23/12   Jennifer N Shaw, MD  furosemide (LASIX) 40 MG   tablet Take 1 tablet (40 mg total) by mouth 2 (two) times daily. PATIENT NEEDS OFFICE VISIT FOR ADDITIONAL REFILLS    Jennifer N Shaw, MD  gabapentin (NEURONTIN) 300 MG capsule Take 300 mg by mouth 4 (four) times daily.     Historical Provider, MD  HYDROcodone-homatropine (HYCODAN) 5-1.5 MG/5ML syrup Take 5 mLs by mouth every 8 (eight) hours as needed for cough. 05/01/13   Jennifer Lauenstein, MD  levothyroxine (SYNTHROID, LEVOTHROID) 50 MCG tablet Take 1 tablet (50 mcg total) by mouth daily. 10/12/13   Jennifer E Egan, [name]  potassium chloride SA (KLOR-CON M20) 20 MEQ tablet TAKE 2 TABLETS BY MOUTH 2 TIMES DAILY. 12/29/12   Jennifer N Shaw, MD  SUMAtriptan (IMITREX) 50 MG tablet Take 1 tablet  (50 mg total) by mouth every 2 (two) hours as needed for migraine. 12/29/12   Jennifer N Shaw, MD  topiramate (TOPAMAX) 100 MG tablet Take 1 tablet (100 mg total) by mouth at bedtime. 12/29/12   Jennifer N Shaw, MD   Review of Systems  Constitutional: Negative for fever, chills, diaphoresis and unexpected weight change.  Cardiovascular: Positive for leg swelling (left LE).  Gastrointestinal: Negative for abdominal pain, diarrhea and constipation.  Genitourinary: Negative for dysuria, urgency, frequency and difficulty urinating.  Musculoskeletal: Positive for arthralgias (hip) and back pain (lower).  Neurological: Positive for headaches.  Psychiatric/Behavioral: The patient is nervous/anxious.    BP 136/84  Pulse 102  Temp(Src) 98.4 F (36.9 C) (Oral)  Resp 18  Ht 5' 2" (1.575 m)  Wt 206 lb (93.441 kg)  BMI 37.67 kg/m2  SpO2 96%    Objective:   Physical Exam  Nursing note and vitals reviewed. Constitutional: She is oriented to person, place, and time. She appears well-developed and well-nourished. No distress.  HENT:  Head: Normocephalic and atraumatic.  Eyes: Conjunctivae and EOM are normal.  Neck: Normal range of motion. Neck supple. No thyromegaly present.  Cardiovascular: Normal rate, regular rhythm and normal heart sounds.   No murmur heard. Trace dorsalis pedis pulse on the right. 2+ on the left.   Pulmonary/Chest: Effort normal and breath sounds normal. No respiratory distress. She has no wheezes. She has no rales.  Musculoskeletal: Normal range of motion.  Trace pitting edema bilaterally.   Lymphadenopathy:    She has no cervical adenopathy.  Neurological: She is alert and oriented to person, place, and time.  Skin: Skin is warm and dry.  Psychiatric: She has a normal mood and affect. Her behavior is normal.      Assessment & Plan:   Unsure how pt is taking her Potassium supplement. Recommend to restart 1 tab BID with Lasix 40 bid until bmp returns. Recommend rechecking TSH at  pt's next office visit, likely in 6 months as well as BMP, CMP and Lipid panel.  Anxiety and depression  Chronic kidney disease  Edema  Encounter for long-term (current) use of other medications - Plan: Basic metabolic panel  Hyperlipidemia  Hypertension  Hypothyroid - Plan: levothyroxine (SYNTHROID, LEVOTHROID) 50 MCG tablet  Lower back pain  Migraine - Plan: SUMAtriptan (IMITREX) 50 MG tablet  URI (upper respiratory infection) - Plan: fluticasone (FLONASE) 50 MCG/ACT nasal spray  Meds ordered this encounter  Medications  . amitriptyline (ELAVIL) 25 MG tablet    Sig: TAKE 3 TABLET (75 MG TOTAL) BY MOUTH AT BEDTIME.  . topiramate (TOPAMAX) 100 MG tablet    Sig: Take 1 tablet (100 mg total) by mouth at bedtime.    Dispense:    90 tablet    Refill:  3  . SUMAtriptan (IMITREX) 50 MG tablet    Sig: Take 1 tablet (50 mg total) by mouth every 2 (two) hours as needed for migraine.    Dispense:  10 tablet    Refill:  3  . potassium chloride SA (KLOR-CON M20) 20 MEQ tablet    Sig: Take 1 tablet (20 mEq total) by mouth 2 (two) times daily.    Dispense:  180 tablet    Refill:  1  . levothyroxine (SYNTHROID, LEVOTHROID) 50 MCG tablet    Sig: Take 1 tablet (50 mcg total) by mouth daily.    Dispense:  90 tablet    Refill:  2  . furosemide (LASIX) 40 MG tablet    Sig: Take 1 tablet (40 mg total) by mouth 2 (two) times daily.    Dispense:  180 tablet    Refill:  1  . fluticasone (FLONASE) 50 MCG/ACT nasal spray    Sig: Place 2 sprays into both nostrils at bedtime.    Dispense:  16 g    Refill:  6  . clonazePAM (KLONOPIN) 0.5 MG tablet    Sig: TAKE 1 TABLET BY MOUTH TWICE A DAY AS NEEDED FOR ANXIETY CAN TAKE 2 TABLETS AT BEDTIME FOR INSOMNIA    Dispense:  120 tablet    Refill:  5  . citalopram (CELEXA) 40 MG tablet    Sig: TAKE 1 TABLET (40 MG TOTAL) BY MOUTH DAILY.    Dispense:  90 tablet    Refill:  3    I personally performed the services described in this documentation,  which was scribed in my presence. The recorded information has been reviewed and considered, and addended by me as needed.  Jennifer Shaw, MD MPH 

## 2013-10-27 LAB — BASIC METABOLIC PANEL
BUN: 13 mg/dL (ref 6–23)
CHLORIDE: 101 meq/L (ref 96–112)
CO2: 27 meq/L (ref 19–32)
CREATININE: 1.51 mg/dL — AB (ref 0.50–1.10)
Calcium: 9.8 mg/dL (ref 8.4–10.5)
Glucose, Bld: 94 mg/dL (ref 70–99)
Potassium: 4.1 mEq/L (ref 3.5–5.3)
Sodium: 139 mEq/L (ref 135–145)

## 2013-10-28 NOTE — Progress Notes (Signed)
Subjective:    Patient ID: Jennifer Davidson, female    DOB: 08-20-1955, 58 y.o.   MRN: [id number] This chart was scribed for [name], MD by Valera Castle, ED Scribe. This patient was seen in room 29 and the patient's care was started at 3:08 PM.  Chief Complaint  Patient presents with  . Medication Refill    HPI Jennifer Davidson is a 58 y.o. female Pt presents for a medication refill.   Pt reports feeling well overall except for her back and her hip. She sees Dr. Manon Hilding for pain management. She denies any changes with him. She reports being cleared to see [facility] where they discovered another ruptured disc. She reports they are wanting to do an injection, but worker's comp is still being processed.   She reports her anxiety has improved some, but still reports intermittent episodes of anxiety.   She denies urinary incontinence, dysuria, other urinary symptoms. She denies constipation, diarrhea, and other bowel symptoms. She reports having trouble keeping her weight off. She states she has tried changing her diet, without results. She reports being on Lasix. She reports intermittent right LE swelling from the top of her leg to the bottom. She sometimes sleeps with a compression sock on her left leg.   She reports an occasional headache in the mornings. She states she has been drinking plenty of fluids.   She had TSH checked Winter of 2014-2015.   Patient Active Problem List   Diagnosis Date Noted  . Lower back pain 09/21/2013  . Diastolic heart failure 03/17/2012  . Syncope and collapse 02/12/2012  . Hypertension 02/04/2012  . Chronic kidney disease 02/04/2012  . Edema 02/04/2012  . Tachycardia 02/04/2012  . Encounter for long-term (current) use of other medications 02/04/2012  . Anxiety and depression 02/04/2012  . Hypothyroid 02/04/2012  . Hyperlipidemia 02/04/2012   Past Medical History  Diagnosis Date  . Hypertension   . Cholesterol serum increased   . Thyroid  disease   . Sleep apnea   . Migraine   . Depression   . Neuromuscular disorder   . GERD (gastroesophageal reflux disease)   . Anxiety    Past Surgical History  Procedure Laterality Date  . Abdominal hysterectomy    . Back surgery    . Appendectomy     No Known Allergies Prior to Admission medications   Medication Sig Start Date End Date Taking? Authorizing Provider  amitriptyline (ELAVIL) 25 MG tablet TAKE 1 TABLET (25 MG TOTAL) BY MOUTH AT BEDTIME. 04/28/13   [name], MD  azithromycin (ZITHROMAX) 250 MG tablet Take 2 tabs PO x 1 dose, then 1 tab PO QD x 4 days 05/11/13   [name], MD  citalopram (CELEXA) 40 MG tablet TAKE 1 TABLET (40 MG TOTAL) BY MOUTH DAILY. 07/31/13   Morrell Riddle, [name]  clonazePAM (KLONOPIN) 0.5 MG tablet TAKE 1 TABLET BY MOUTH TWICE A DAY AS NEEDED FOR ANXIETY CAN TAKE 2 TABLETS AT BEDTIME FOR INSOMNIA    [name], MD  fenofibrate (TRICOR) 145 MG tablet TAKE 1 TABLET (145 MG TOTAL) BY MOUTH DAILY. 12/25/12   Sondra Barges, [name]  fluconazole (DIFLUCAN) 150 MG tablet Take 1 tablet (150 mg total) by mouth once. Repeat if needed 05/11/13   [name], MD  fluticasone [facility]) 50 MCG/ACT nasal spray Place 2 sprays into the nose at bedtime. 06/23/12   [name], MD  furosemide (LASIX) 40 MG  tablet Take 1 tablet (40 mg total) by mouth 2 (two) times daily. PATIENT NEEDS OFFICE VISIT FOR ADDITIONAL REFILLS    [name], MD  gabapentin (NEURONTIN) 300 MG capsule Take 300 mg by mouth 4 (four) times daily.     Historical Provider, MD  HYDROcodone-homatropine (HYCODAN) 5-1.5 MG/5ML syrup Take 5 mLs by mouth every 8 (eight) hours as needed for cough. 05/01/13   [name], MD  levothyroxine (SYNTHROID, LEVOTHROID) 50 MCG tablet Take 1 tablet (50 mcg total) by mouth daily. 10/12/13   Eleanore Delia Chimes, [name]  potassium chloride SA (KLOR-CON M20) 20 MEQ tablet TAKE 2 TABLETS BY MOUTH 2 TIMES DAILY. 12/29/12   [name], MD  SUMAtriptan (IMITREX) 50 MG tablet Take 1 tablet  (50 mg total) by mouth every 2 (two) hours as needed for migraine. 12/29/12   [name], MD  topiramate (TOPAMAX) 100 MG tablet Take 1 tablet (100 mg total) by mouth at bedtime. 12/29/12   [name], MD   Review of Systems  Constitutional: Negative for fever, chills, diaphoresis and unexpected weight change.  Cardiovascular: Positive for leg swelling (left LE).  Gastrointestinal: Negative for abdominal pain, diarrhea and constipation.  Genitourinary: Negative for dysuria, urgency, frequency and difficulty urinating.  Musculoskeletal: Positive for arthralgias (hip) and back pain (lower).  Neurological: Positive for headaches.  Psychiatric/Behavioral: The patient is nervous/anxious.    BP 136/84  Pulse 102  Temp(Src) 98.4 F (36.9 C) (Oral)  Resp 18  Ht 5\' 2"  (1.575 m)  Wt 206 lb (93.441 kg)  BMI 37.67 kg/m2  SpO2 96%    Objective:   Physical Exam  Nursing note and vitals reviewed. Constitutional: She is oriented to person, place, and time. She appears well-developed and well-nourished. No distress.  HENT:  Head: Normocephalic and atraumatic.  Eyes: Conjunctivae and EOM are normal.  Neck: Normal range of motion. Neck supple. No thyromegaly present.  Cardiovascular: Normal rate, regular rhythm and normal heart sounds.   No murmur heard. Trace dorsalis pedis pulse on the right. 2+ on the left.   Pulmonary/Chest: Effort normal and breath sounds normal. No respiratory distress. She has no wheezes. She has no rales.  Musculoskeletal: Normal range of motion.  Trace pitting edema bilaterally.   Lymphadenopathy:    She has no cervical adenopathy.  Neurological: She is alert and oriented to person, place, and time.  Skin: Skin is warm and dry.  Psychiatric: She has a normal mood and affect. Her behavior is normal.      Assessment & Plan:   Unsure how pt is taking her Potassium supplement. Recommend to restart 1 tab BID with Lasix 40 bid until bmp returns. Recommend rechecking TSH at  pt's next office visit, likely in 6 months as well as BMP, CMP and Lipid panel.  Anxiety and depression  Chronic kidney disease  Edema  Encounter for long-term (current) use of other medications - Plan: Basic metabolic panel  Hyperlipidemia  Hypertension  Hypothyroid - Plan: levothyroxine (SYNTHROID, LEVOTHROID) 50 MCG tablet  Lower back pain  Migraine - Plan: SUMAtriptan (IMITREX) 50 MG tablet  URI (upper respiratory infection) - Plan: fluticasone (FLONASE) 50 MCG/ACT nasal spray  Meds ordered this encounter  Medications  . amitriptyline (ELAVIL) 25 MG tablet    Sig: TAKE 3 TABLET (75 MG TOTAL) BY MOUTH AT BEDTIME.  Marland Kitchen topiramate (TOPAMAX) 100 MG tablet    Sig: Take 1 tablet (100 mg total) by mouth at bedtime.    Dispense:  90 tablet    Refill:  3  . SUMAtriptan (IMITREX) 50 MG tablet    Sig: Take 1 tablet (50 mg total) by mouth every 2 (two) hours as needed for migraine.    Dispense:  10 tablet    Refill:  3  . potassium chloride SA (KLOR-CON M20) 20 MEQ tablet    Sig: Take 1 tablet (20 mEq total) by mouth 2 (two) times daily.    Dispense:  180 tablet    Refill:  1  . levothyroxine (SYNTHROID, LEVOTHROID) 50 MCG tablet    Sig: Take 1 tablet (50 mcg total) by mouth daily.    Dispense:  90 tablet    Refill:  2  . furosemide (LASIX) 40 MG tablet    Sig: Take 1 tablet (40 mg total) by mouth 2 (two) times daily.    Dispense:  180 tablet    Refill:  1  . fluticasone (FLONASE) 50 MCG/ACT nasal spray    Sig: Place 2 sprays into both nostrils at bedtime.    Dispense:  16 g    Refill:  6  . clonazePAM (KLONOPIN) 0.5 MG tablet    Sig: TAKE 1 TABLET BY MOUTH TWICE A DAY AS NEEDED FOR ANXIETY CAN TAKE 2 TABLETS AT BEDTIME FOR INSOMNIA    Dispense:  120 tablet    Refill:  5  . citalopram (CELEXA) 40 MG tablet    Sig: TAKE 1 TABLET (40 MG TOTAL) BY MOUTH DAILY.    Dispense:  90 tablet    Refill:  3    I personally performed the services described in this documentation,  which was scribed in my presence. The recorded information has been reviewed and considered, and addended by me as needed.  Norberto Sorenson, MD MPH

## 2013-11-07 ENCOUNTER — Other Ambulatory Visit: Payer: Self-pay | Admitting: Physician Assistant

## 2013-11-18 ENCOUNTER — Other Ambulatory Visit: Payer: Self-pay | Admitting: Family Medicine

## 2013-11-24 ENCOUNTER — Other Ambulatory Visit: Payer: Self-pay | Admitting: Family Medicine

## 2013-12-07 ENCOUNTER — Ambulatory Visit: Payer: Self-pay | Admitting: Family Medicine

## 2013-12-31 ENCOUNTER — Telehealth: Payer: Self-pay

## 2013-12-31 NOTE — Telephone Encounter (Signed)
Calling for Dr. Clelia CroftShaw, Jennifer Davidson has some questions, and says she needs her to send some messages to her lawyer having to do with her workers comp case and her back. Patient states that Dr. Clelia CroftShaw said that she would write a letter for her, and the pt is wondering if she can still do this for her. Her Lawyers fax number is [phone], his name Elyn PeersJay Gervasi, he needs to know all the medications she is on.

## 2014-01-01 NOTE — Telephone Encounter (Signed)
Will be happy to do and will send note to clinical message pool when done.

## 2014-01-09 ENCOUNTER — Telehealth: Payer: Self-pay

## 2014-01-09 NOTE — Telephone Encounter (Signed)
Pt requested a letter to be sent to her lawyer last week that disclosed her medication list.  This has not been done and she needs it today. She is seeing her lawyer today.  Please call her asap at [phone]

## 2014-01-09 NOTE — Telephone Encounter (Signed)
Jennifer Davidson has some questions, and says she needs her to send some messages to her lawyer having to do with her workers comp case and her back. Patient states that Dr. [name] said that she would write a letter for her, and the pt is wondering if she can still do this for her. Her Lawyers fax number is [phone], his name Elyn Peers, he needs to know all the medications she is on.  Dr. [name] do you have this letter to send to her lawyer that I can fax for her?

## 2014-01-14 ENCOUNTER — Encounter: Payer: Self-pay | Admitting: Family Medicine

## 2014-01-14 NOTE — Telephone Encounter (Signed)
Letter written and med list printed. Given to [place] to fax to lawyer and please let pt pick up a copy or mail her a copy if she would like

## 2014-02-23 ENCOUNTER — Other Ambulatory Visit: Payer: Self-pay | Admitting: Family Medicine

## 2014-03-06 ENCOUNTER — Other Ambulatory Visit: Payer: Self-pay | Admitting: Family Medicine

## 2014-03-24 ENCOUNTER — Other Ambulatory Visit: Payer: Self-pay | Admitting: Family Medicine

## 2014-04-05 ENCOUNTER — Other Ambulatory Visit: Payer: Self-pay | Admitting: Family Medicine

## 2014-04-17 ENCOUNTER — Ambulatory Visit (INDEPENDENT_AMBULATORY_CARE_PROVIDER_SITE_OTHER): Payer: Managed Care, Other (non HMO)

## 2014-04-17 ENCOUNTER — Ambulatory Visit (INDEPENDENT_AMBULATORY_CARE_PROVIDER_SITE_OTHER): Payer: Managed Care, Other (non HMO) | Admitting: Family Medicine

## 2014-04-17 VITALS — BP 114/68 | HR 92 | Temp 98.7°F | Resp 16 | Ht 63.5 in | Wt 209.0 lb

## 2014-04-17 DIAGNOSIS — J029 Acute pharyngitis, unspecified: Secondary | ICD-10-CM

## 2014-04-17 DIAGNOSIS — R05 Cough: Secondary | ICD-10-CM

## 2014-04-17 DIAGNOSIS — R509 Fever, unspecified: Secondary | ICD-10-CM

## 2014-04-17 DIAGNOSIS — R059 Cough, unspecified: Secondary | ICD-10-CM

## 2014-04-17 DIAGNOSIS — H109 Unspecified conjunctivitis: Secondary | ICD-10-CM

## 2014-04-17 LAB — POCT RAPID STREP A (OFFICE): Rapid Strep A Screen: NEGATIVE

## 2014-04-17 MED ORDER — LEVOFLOXACIN 500 MG PO TABS: 500.0000 mg | ORAL_TABLET | Freq: Every day | ORAL | Status: DC

## 2014-04-17 MED ORDER — TOBRAMYCIN 0.3 % OP SOLN: 1.0000 [drp] | Freq: Four times a day (QID) | OPHTHALMIC | Status: DC

## 2014-04-17 MED ORDER — HYDROCOD POLST-CHLORPHEN POLST 10-8 MG/5ML PO LQCR
5.0000 mL | Freq: Two times a day (BID) | ORAL | Status: DC | PRN
Start: 2014-04-17 — End: 2014-05-03

## 2014-04-17 MED ORDER — ALBUTEROL SULFATE (2.5 MG/3ML) 0.083% IN NEBU
2.5000 mg | INHALATION_SOLUTION | Freq: Once | RESPIRATORY_TRACT | Status: AC
  Administered 2014-04-17: 2.5 mg via RESPIRATORY_TRACT

## 2014-04-17 MED ORDER — METHYLPREDNISOLONE ACETATE 80 MG/ML IJ SUSP
80.0000 mg | Freq: Once | INTRAMUSCULAR | Status: AC
  Administered 2014-04-17: 80 mg via INTRAMUSCULAR

## 2014-04-17 MED ORDER — IPRATROPIUM BROMIDE 0.02 % IN SOLN
0.5000 mg | Freq: Once | RESPIRATORY_TRACT | Status: AC
  Administered 2014-04-17: 0.5 mg via RESPIRATORY_TRACT

## 2014-04-17 NOTE — Patient Instructions (Signed)
Your strep test is negative. X-ray looks like you have a severe bronchitis and possibly early pneumonia in the left chest. For this reason forgiving in several different medications including antibiotic, bronchodilators, and cough medicine.

## 2014-04-17 NOTE — Progress Notes (Signed)
This chart was scribed for Elvina SidleKurt Lauenstein, MD by Tonye RoyaltyJoshua Chen, ED Scribe. This patient was seen in room 11 and the patient's care was started at 9:46 AM.    Patient ID: Jennifer Davidson MRN: [id number], DOB: 02/17/1956, 58 y.o. Date of Encounter: 04/17/2014, 9:46 AM  Primary Physician: Norberto SorensonSHAW,EVA, MD  Chief Complaint: cough  HPI: 58 y.o. year old female with history below presents with productive cough with onset 3 weeks ago. She reports associated sore throat, eye redness, matted eyes, bilateral otalgia worse on the left, and sinus symptoms. She reports history of back problems for which she uses Gabapentin. She uses a sleep apnea machine but has been unable to use it lately due to her cough. She denies ever smoking or history of asthma. She denies fever.   Past Medical History  Diagnosis Date   Hypertension    Cholesterol serum increased    Thyroid disease    Sleep apnea    Migraine    Depression    Neuromuscular disorder    GERD (gastroesophageal reflux disease)    Anxiety      Home Meds: Prior to Admission medications   Medication Sig Start Date End Date Taking? Authorizing Provider  amitriptyline (ELAVIL) 25 MG tablet TAKE 3 TABLET (75 MG TOTAL) BY MOUTH AT BEDTIME. 04/28/13  Yes Historical Provider, MD  citalopram (CELEXA) 40 MG tablet TAKE 1 TABLET (40 MG TOTAL) BY MOUTH DAILY 03/24/14  Yes Morrell RiddleSarah L Weber, [name]  clonazePAM (KLONOPIN) 0.5 MG tablet TAKE 1 TABLET BY MOUTH TWICE A DAY AS NEEDED FOR ANXIETY CAN TAKE 2 TABLETS AT BEDTIME FOR INSOMNIA 10/26/13  Yes Sherren MochaEva N Shaw, MD  fluticasone (FLONASE) 50 MCG/ACT nasal spray Place 2 sprays into both nostrils at bedtime. 10/26/13  Yes Sherren MochaEva N Shaw, MD  furosemide (LASIX) 40 MG tablet Take 1 tablet (40 mg total) by mouth 2 (two) times daily. 10/26/13  Yes Sherren MochaEva N Shaw, MD  furosemide (LASIX) 40 MG tablet Take 1 tablet (40 mg total) by mouth 2 (two) times daily.   Yes Morrell RiddleSarah L Weber, [name]  gabapentin (NEURONTIN) 300 MG capsule Take 400 mg  by mouth 3 (three) times daily.    Yes Historical Provider, MD  levothyroxine (SYNTHROID, LEVOTHROID) 50 MCG tablet TAKE 1 TABLET (50 MCG TOTAL) BY MOUTH DAILY. 04/05/14  Yes Morrell RiddleSarah L Weber, [name]  potassium chloride SA (KLOR-CON M20) 20 MEQ tablet Take 1 tablet (20 mEq total) by mouth 2 (two) times daily. 10/26/13  Yes Sherren MochaEva N Shaw, MD  SUMAtriptan (IMITREX) 50 MG tablet Take 1 tablet (50 mg total) by mouth every 2 (two) hours as needed for migraine. 10/26/13  Yes Sherren MochaEva N Shaw, MD  topiramate (TOPAMAX) 100 MG tablet Take 1 tablet (100 mg total) by mouth at bedtime. 10/26/13  Yes Sherren MochaEva N Shaw, MD    Allergies: No Known Allergies  History   Social History   Marital Status: Married    Spouse Name: N/A    Number of Children: N/A   Years of Education: N/A   Occupational History   Not on file.   Social History Main Topics   Smoking status: Never Smoker    Smokeless tobacco: Never Used   Alcohol Use: No   Drug Use: No   Sexual Activity:    Partners: Male     Comment: married   Other Topics Concern   Not on file   Social History Narrative     Review of Systems: Constitutional: negative for chills,  fever, night sweats, weight changes, or fatigue  HEENT: negative for vision changes, hearing loss, congestion, rhinorrhea, ST, epistaxis, or sinus pressure, positive sore throat, otalgia, matted eyes, eye redness, sinus congestion Cardiovascular: negative for chest pain or palpitations Respiratory: negative for hemoptysis, wheezing, shortness of breath, positive cough Abdominal: negative for abdominal pain, nausea, vomiting, diarrhea, or constipation Dermatological: negative for rash Neurologic: negative for headache, dizziness, or syncope All other systems reviewed and are otherwise negative with the exception to those above and in the HPI.   Physical Exam: Blood pressure 114/68, pulse 92, temperature 98.7 F (37.1 C), temperature source Oral, resp. rate 16, height 5' 3.5" (1.613 m),  weight 209 lb (94.802 kg), SpO2 94 %., Body mass index is 36.44 kg/(m^2). General: Well developed, well nourished, in no acute distress. Head: Normocephalic, atraumatic, eyes without discharge, sclera non-icteric, nares are without discharge. Bilateral auditory canals clear, TM's are without perforation, pearly grey and translucent with reflective cone of light bilaterally. Oral cavity moist, posterior pharynx without exudate, erythema, peritonsillar abscess, or post nasal drip. conjunctiva are inflamed bilaterally  Neck: Supple. No thyromegaly. Full ROM. No lymphadenopathy. Lungs: Clear bilaterally to auscultation without wheezes or rhonchi. Breathing is unlabored. Diffuse rales in both sides of chest Heart: RRR with S1 S2. No murmurs, rubs, or gallops appreciated. Abdomen: Soft, non-tender, non-distended with normoactive bowel sounds. No hepatomegaly. No rebound/guarding. No obvious abdominal masses. Msk:  Strength and tone normal for age. Extremities/Skin: Warm and dry. No clubbing or cyanosis. No edema. No rashes or suspicious lesions. Neuro: Alert and oriented X 3. Moves all extremities spontaneously. Gait is normal. CNII-XII grossly in tact. Psych:  Responds to questions appropriately with a normal affect.   Labs: Results for orders placed or performed in visit on 04/17/14  POCT rapid strep A  Result Value Ref Range   Rapid Strep A Screen Negative Negative   [facility] reading (PRIMARY) by  Dr. Milus GlazierLauenstein: Chest x-ray shows diffuse reticular pattern, worse on the left with suggestion of early infiltrate in the mid left lung field..   ASSESSMENT AND PLAN:  58 y.o. year old female with 3 weeks of cough and congestion along with sore throat. She's developed conjunctivitis as well. Sore throat - Plan: POCT rapid strep A  Cough - Plan: DG Chest 2 View, methylPREDNISolone acetate (DEPO-MEDROL) injection 80 mg, levofloxacin (LEVAQUIN) 500 MG tablet  Other specified fever  Bilateral  conjunctivitis - Plan: tobramycin (TOBREX) 0.3 % ophthalmic solution     Signed, Elvina SidleKurt Lauenstein, MD 04/17/2014 9:46 AM

## 2014-05-02 ENCOUNTER — Other Ambulatory Visit: Payer: Self-pay | Admitting: Family Medicine

## 2014-05-03 ENCOUNTER — Ambulatory Visit (INDEPENDENT_AMBULATORY_CARE_PROVIDER_SITE_OTHER): Payer: Managed Care, Other (non HMO) | Admitting: Family Medicine

## 2014-05-03 ENCOUNTER — Encounter: Payer: Self-pay | Admitting: Family Medicine

## 2014-05-03 VITALS — BP 119/73 | HR 103 | Temp 98.1°F | Resp 16 | Ht 62.25 in | Wt 204.4 lb

## 2014-05-03 DIAGNOSIS — F329 Major depressive disorder, single episode, unspecified: Secondary | ICD-10-CM

## 2014-05-03 DIAGNOSIS — N183 Chronic kidney disease, stage 3 unspecified: Secondary | ICD-10-CM

## 2014-05-03 DIAGNOSIS — G43909 Migraine, unspecified, not intractable, without status migrainosus: Secondary | ICD-10-CM

## 2014-05-03 DIAGNOSIS — E039 Hypothyroidism, unspecified: Secondary | ICD-10-CM

## 2014-05-03 DIAGNOSIS — F419 Anxiety disorder, unspecified: Secondary | ICD-10-CM

## 2014-05-03 DIAGNOSIS — F418 Other specified anxiety disorders: Secondary | ICD-10-CM

## 2014-05-03 DIAGNOSIS — R05 Cough: Secondary | ICD-10-CM

## 2014-05-03 DIAGNOSIS — F32A Depression, unspecified: Secondary | ICD-10-CM

## 2014-05-03 DIAGNOSIS — Z79899 Other long term (current) drug therapy: Secondary | ICD-10-CM

## 2014-05-03 DIAGNOSIS — R059 Cough, unspecified: Secondary | ICD-10-CM

## 2014-05-03 DIAGNOSIS — K219 Gastro-esophageal reflux disease without esophagitis: Secondary | ICD-10-CM

## 2014-05-03 DIAGNOSIS — J069 Acute upper respiratory infection, unspecified: Secondary | ICD-10-CM

## 2014-05-03 DIAGNOSIS — E785 Hyperlipidemia, unspecified: Secondary | ICD-10-CM

## 2014-05-03 DIAGNOSIS — M545 Low back pain: Secondary | ICD-10-CM

## 2014-05-03 DIAGNOSIS — I10 Essential (primary) hypertension: Secondary | ICD-10-CM

## 2014-05-03 DIAGNOSIS — R609 Edema, unspecified: Secondary | ICD-10-CM

## 2014-05-03 LAB — POCT CBC
Granulocyte percent: 70.3 %G (ref 37–80)
HCT, POC: 43.5 % (ref 37.7–47.9)
Hemoglobin: 13.8 g/dL (ref 12.2–16.2)
LYMPH, POC: 3.2 (ref 0.6–3.4)
MCH, POC: 28.3 pg (ref 27–31.2)
MCHC: 31.8 g/dL (ref 31.8–35.4)
MCV: 89.6 fL (ref 80–97)
MID (CBC): 0.6 (ref 0–0.9)
MPV: 6.3 fL (ref 0–99.8)
POC Granulocyte: 8.9 — AB (ref 2–6.9)
POC LYMPH PERCENT: 25.1 %L (ref 10–50)
POC MID %: 4.6 %M (ref 0–12)
Platelet Count, POC: 388 10*3/uL (ref 142–424)
RBC: 4.88 M/uL (ref 4.04–5.48)
RDW, POC: 15.6 %
WBC: 12.6 10*3/uL — AB (ref 4.6–10.2)

## 2014-05-03 MED ORDER — SUMATRIPTAN SUCCINATE 50 MG PO TABS: 50.0000 mg | ORAL_TABLET | ORAL | Status: DC | PRN

## 2014-05-03 MED ORDER — CITALOPRAM HYDROBROMIDE 40 MG PO TABS: ORAL_TABLET | ORAL | Status: DC

## 2014-05-03 MED ORDER — FUROSEMIDE 40 MG PO TABS: 40.0000 mg | ORAL_TABLET | Freq: Two times a day (BID) | ORAL | Status: DC

## 2014-05-03 MED ORDER — CLONAZEPAM 0.5 MG PO TABS: ORAL_TABLET | ORAL | Status: DC

## 2014-05-03 MED ORDER — FLUTICASONE PROPIONATE 50 MCG/ACT NA SUSP: 2.0000 | Freq: Every day | NASAL | Status: DC

## 2014-05-03 MED ORDER — RANITIDINE HCL 150 MG PO TABS: 150.0000 mg | ORAL_TABLET | Freq: Two times a day (BID) | ORAL | Status: DC | PRN

## 2014-05-03 MED ORDER — POTASSIUM CHLORIDE CRYS ER 20 MEQ PO TBCR: 20.0000 meq | EXTENDED_RELEASE_TABLET | Freq: Two times a day (BID) | ORAL | Status: DC

## 2014-05-03 MED ORDER — OMEPRAZOLE 40 MG PO CPDR: 40.0000 mg | DELAYED_RELEASE_CAPSULE | Freq: Every day | ORAL | Status: DC

## 2014-05-03 MED ORDER — AMITRIPTYLINE HCL 25 MG PO TABS: ORAL_TABLET | ORAL | Status: DC

## 2014-05-03 MED ORDER — CETIRIZINE HCL 10 MG PO TABS: 10.0000 mg | ORAL_TABLET | Freq: Every day | ORAL | Status: DC

## 2014-05-03 NOTE — Patient Instructions (Signed)
If you are still doing well at the end of January, consider trying to decrease your citalopram - cut it in half to be 20mg  daily.

## 2014-05-03 NOTE — Progress Notes (Addendum)
Subjective:    Patient ID: Jennifer Davidson, female    DOB: 08/18/1955, 58 y.o.   MRN: [id number] This chart was scribed for Norberto Sorenson, MD by Jolene Provost, Medical Scribe. This patient was seen in Room 27 and the patient's care was started a 1:00 PM.  Chief Complaint  Patient presents with  . Hypothyroidism  . Hypertension  . Hyperlipidemia  . Depression    HPI  Past Medical History  Diagnosis Date  . Hypertension   . Cholesterol serum increased   . Thyroid disease   . Sleep apnea   . Migraine   . Depression   . Neuromuscular disorder   . GERD (gastroesophageal reflux disease)   . Anxiety    No Known Allergies Current Outpatient Prescriptions on File Prior to Visit  Medication Sig Dispense Refill  . amitriptyline (ELAVIL) 25 MG tablet TAKE 3 TABLET (75 MG TOTAL) BY MOUTH AT BEDTIME.    . citalopram (CELEXA) 40 MG tablet TAKE 1 TABLET (40 MG TOTAL) BY MOUTH DAILY 30 tablet 0  . clonazePAM (KLONOPIN) 0.5 MG tablet TAKE 1 TABLET BY MOUTH TWICE A DAY AS NEEDED FOR ANXIETY CAN TAKE 2 TABLETS AT BEDTIME FOR INSOMNIA 120 tablet 5  . fluticasone (FLONASE) 50 MCG/ACT nasal spray Place 2 sprays into both nostrils at bedtime. 16 g 6  . furosemide (LASIX) 40 MG tablet Take 1 tablet (40 mg total) by mouth 2 (two) times daily. 180 tablet 1  . furosemide (LASIX) 40 MG tablet Take 1 tablet (40 mg total) by mouth 2 (two) times daily. 60 tablet 5  . gabapentin (NEURONTIN) 300 MG capsule Take 400 mg by mouth 3 (three) times daily.     Marland Kitchen levothyroxine (SYNTHROID, LEVOTHROID) 50 MCG tablet TAKE 1 TABLET (50 MCG TOTAL) BY MOUTH DAILY. 90 tablet 0  . potassium chloride SA (KLOR-CON M20) 20 MEQ tablet Take 1 tablet (20 mEq total) by mouth 2 (two) times daily. 180 tablet 1  . SUMAtriptan (IMITREX) 50 MG tablet Take 1 tablet (50 mg total) by mouth every 2 (two) hours as needed for migraine. 10 tablet 3  . tobramycin (TOBREX) 0.3 % ophthalmic solution Place 1 drop into both eyes every 6 (six) hours. 5 mL 0    . topiramate (TOPAMAX) 100 MG tablet Take 1 tablet (100 mg total) by mouth at bedtime. 90 tablet 3   No current facility-administered medications on file prior to visit.   HPI Comments: Jennifer Davidson is a 58 y.o. female who presents to [facility] reporting for a regular follow up. Pt endorses continuing productive hacking cough with green drainage. Pt endorses heartburn, sinus congestion. Pt states she is using flonaise at bedtime. Pt states she has taken Claritin previously, without relief. Pt states she takes Prilosec OTC. Pt states she takes a decongestant when she has a HA, with relief.   Pt was seen in office two weeks ago and started on levequin 500mg  for 7 days. Pt was given a depomedrol 80mg  shot.   Review of Systems  Constitutional: Negative for fever and chills.  HENT: Positive for postnasal drip.   Respiratory: Positive for cough.   Genitourinary: Negative for dysuria, frequency and difficulty urinating.  Musculoskeletal: Positive for back pain.  Neurological: Positive for headaches.  Psychiatric/Behavioral: Positive for sleep disturbance.       Objective:  BP 119/73 mmHg  Pulse 103  Temp(Src) 98.1 F (36.7 C) (Oral)  Resp 16  Ht 5' 2.25" (1.581 m)  Wt 204 lb  6.4 oz (92.715 kg)  BMI 37.09 kg/m2  SpO2 94%  Physical Exam  Constitutional: She is oriented to person, place, and time. She appears well-developed and well-nourished.  HENT:  Head: Normocephalic and atraumatic.  Eyes: Pupils are equal, round, and reactive to light.  Neck: Neck supple.  Cardiovascular: Normal rate, regular rhythm and normal heart sounds.   No murmur heard. Pulmonary/Chest: Effort normal and breath sounds normal. No respiratory distress. She has no wheezes.  Musculoskeletal:  1+ pitting edema.  Neurological: She is alert and oriented to person, place, and time.  Skin: Skin is warm and dry.  Psychiatric: She has a normal mood and affect. Her behavior is normal.  Nursing note and vitals  reviewed.  CBC    Component Value Date/Time   WBC 12.6* 05/03/2014 1217   WBC 9.1 09/22/2012 0949   RBC 4.88 05/03/2014 1217   RBC 4.53 09/22/2012 0949   HGB 13.8 05/03/2014 1217   HGB 12.5 09/22/2012 0949   HCT 43.5 05/03/2014 1217   HCT 37.0 09/22/2012 0949   PLT 308 09/22/2012 0949   MCV 89.6 05/03/2014 1217   MCV 81.7 09/22/2012 0949   MCH 28.3 05/03/2014 1217   MCH 27.6 09/22/2012 0949   MCHC 31.8 05/03/2014 1217   MCHC 33.8 09/22/2012 0949   RDW 14.9 09/22/2012 0949   LYMPHSABS 2.6 09/22/2012 0949   MONOABS 0.7 09/22/2012 0949   EOSABS 0.1 09/22/2012 0949   BASOSABS 0.0 09/22/2012 0949         Assessment & Plan:  REFILL LEVOTHYROXINE AFTER LABS RETURN Cough - Plan: POCT CBC - sig improved s/p levaquin - still with some productive cough but no other sxs and exam normal.  Continue watchful waiting and hopefully cough will cont to resolve but pt does have some leukocytosis today so if sxs persist may need zpack.  Gastroesophageal reflux disease, esophagitis presence not specified - has not had H. Pylori checked so consider that and endoscopy if sxs continue. Increase prilosec from otc dose to rx dose and can use prn zantac for breakthrough sxs  URI (upper respiratory infection) - Plan: fluticasone (FLONASE) 50 MCG/ACT nasal spray  Migraine without status migrainosus, not intractable, unspecified migraine type - Plan: SUMAtriptan (IMITREX) 50 MG tablet - stable  Essential hypertension  Chronic kidney disease, stage 3 (moderate) - Plan: Vit D  25 hydroxy (rtn osteoporosis monitoring) - stable, due to h/o nsaid use, push fluids, refer to nephrology at GFR 34.  Recommend checking spep/upep or sife at f/u due to elevated protein level in setting on renal dysfunction (although albumin normal so suspicion of MM low.  Edema - persists but at baseline on bid lasix w/ K  Encounter for long-term (current) use of medications - Plan: COMPLETE METABOLIC PANEL WITH  GFR  Hypothyroidism, unspecified hypothyroidism type - Plan: TSH - dose stable for years so refilled at 50  Anxiety and depression - Plan: Vitamin B12 - mood sxs sig improved since pain is decreased - if pt is still doing well in another 4-6 wks, rec decreasing citalopram from 40 to 20 (cut in half). Using clonazepam only qhs.  Hyperlipidemia - Plan: Lipid panel - ASCVD risk 4% even though chol sig worsened from prior - cont watchful waiting.  Low back pain, unspecified back pain laterality, with sciatica presence unspecified - Plan: Vit D  25 hydroxy (rtn osteoporosis monitoring), Vitamin B12  Pt is long overdue for a CPE - encourage scheduling for this - though health maintanence appears UTD  as pt has had hysterectomy. Is due for zostavax - give rx at f/u.  Meds ordered this encounter  Medications  . ranitidine (ZANTAC) 150 MG tablet    Sig: Take 1 tablet (150 mg total) by mouth 3 times/day as needed-between meals & bedtime for heartburn.    Dispense:  60 tablet    Refill:  11  . cetirizine (ZYRTEC) 10 MG tablet    Sig: Take 1 tablet (10 mg total) by mouth daily.    Dispense:  30 tablet    Refill:  11  . clonazePAM (KLONOPIN) 0.5 MG tablet    Sig: TAKE 1 TABLET BY MOUTH TWICE A DAY AS NEEDED FOR ANXIETY CAN TAKE 2 TABLETS AT BEDTIME FOR INSOMNIA    Dispense:  120 tablet    Refill:  5  . amitriptyline (ELAVIL) 25 MG tablet    Sig: TAKE 3 TABLET (75 MG TOTAL) BY MOUTH AT BEDTIME.    Dispense:  270 tablet    Refill:  1  . citalopram (CELEXA) 40 MG tablet    Sig: TAKE 1 TABLET (40 MG TOTAL) BY MOUTH DAILY    Dispense:  90 tablet    Refill:  1  . fluticasone (FLONASE) 50 MCG/ACT nasal spray    Sig: Place 2 sprays into both nostrils at bedtime.    Dispense:  16 g    Refill:  6  . furosemide (LASIX) 40 MG tablet    Sig: Take 1 tablet (40 mg total) by mouth 2 (two) times daily.    Dispense:  180 tablet    Refill:  1  . potassium chloride SA (KLOR-CON M20) 20 MEQ tablet    Sig:  Take 1 tablet (20 mEq total) by mouth 2 (two) times daily.    Dispense:  180 tablet    Refill:  1  . SUMAtriptan (IMITREX) 50 MG tablet    Sig: Take 1 tablet (50 mg total) by mouth every 2 (two) hours as needed for migraine.    Dispense:  10 tablet    Refill:  3    I personally performed the services described in this documentation, which was scribed in my presence. The recorded information has been reviewed and considered, and addended by me as needed.  Norberto SorensonEva Shaw, MD MPH

## 2014-05-04 LAB — VITAMIN D 25 HYDROXY (VIT D DEFICIENCY, FRACTURES): VIT D 25 HYDROXY: 61 ng/mL (ref 30–100)

## 2014-05-04 LAB — LIPID PANEL
Cholesterol: 269 mg/dL — ABNORMAL HIGH (ref 0–200)
HDL: 73 mg/dL (ref >39)
LDL Cholesterol: 145 mg/dL — ABNORMAL HIGH (ref 0–99)
Total CHOL/HDL Ratio: 3.7 Ratio
Triglycerides: 253 mg/dL — ABNORMAL HIGH (ref <150)
VLDL: 51 mg/dL — ABNORMAL HIGH (ref 0–40)

## 2014-05-04 LAB — COMPLETE METABOLIC PANEL WITH GFR
ALBUMIN: 4.7 g/dL (ref 3.5–5.2)
ALT: 17 U/L (ref 0–35)
AST: 20 U/L (ref 0–37)
Alkaline Phosphatase: 72 U/L (ref 39–117)
BUN: 29 mg/dL — ABNORMAL HIGH (ref 6–23)
CALCIUM: 10 mg/dL (ref 8.4–10.5)
CHLORIDE: 98 meq/L (ref 96–112)
CO2: 28 meq/L (ref 19–32)
CREATININE: 1.64 mg/dL — AB (ref 0.50–1.10)
GFR, EST AFRICAN AMERICAN: 39 mL/min — AB
GFR, Est Non African American: 34 mL/min — ABNORMAL LOW
Glucose, Bld: 91 mg/dL (ref 70–99)
POTASSIUM: 3.9 meq/L (ref 3.5–5.3)
Sodium: 138 mEq/L (ref 135–145)
Total Bilirubin: 0.5 mg/dL (ref 0.2–1.2)
Total Protein: 8.6 g/dL — ABNORMAL HIGH (ref 6.0–8.3)

## 2014-05-04 LAB — VITAMIN B12: VITAMIN B 12: 977 pg/mL — AB (ref 211–911)

## 2014-05-04 LAB — TSH: TSH: 0.539 u[IU]/mL (ref 0.350–4.500)

## 2014-05-13 ENCOUNTER — Other Ambulatory Visit: Payer: Self-pay | Admitting: Family Medicine

## 2014-05-13 DIAGNOSIS — N183 Chronic kidney disease, stage 3 unspecified: Secondary | ICD-10-CM

## 2014-05-13 MED ORDER — LEVOTHYROXINE SODIUM 50 MCG PO TABS: ORAL_TABLET | ORAL | Status: DC

## 2014-05-31 ENCOUNTER — Other Ambulatory Visit: Payer: Self-pay | Admitting: Physician Assistant

## 2014-06-09 ENCOUNTER — Other Ambulatory Visit: Payer: Self-pay | Admitting: Family Medicine

## 2014-06-09 MED ORDER — CITALOPRAM HYDROBROMIDE 40 MG PO TABS: ORAL_TABLET | ORAL | Status: DC

## 2014-06-21 ENCOUNTER — Encounter: Payer: Self-pay | Admitting: Family Medicine

## 2014-06-21 ENCOUNTER — Ambulatory Visit (INDEPENDENT_AMBULATORY_CARE_PROVIDER_SITE_OTHER): Payer: Managed Care, Other (non HMO) | Admitting: Family Medicine

## 2014-06-21 VITALS — BP 125/78 | HR 94 | Temp 98.2°F | Resp 16 | Wt 211.0 lb

## 2014-06-21 DIAGNOSIS — Z23 Encounter for immunization: Secondary | ICD-10-CM

## 2014-06-21 MED ORDER — ZOSTER VACCINE LIVE 19400 UNT/0.65ML ~~LOC~~ SOLR: 0.6500 mL | Freq: Once | SUBCUTANEOUS | Status: DC

## 2014-06-21 NOTE — Progress Notes (Signed)
Subjective:    Patient ID: Jennifer Davidson, female    DOB: August 26, 1955, 59 y.o.   MRN: [id number] This chart was scribed for Norberto Sorenson, MD by Jolene Provost, Medical Scribe. This patient was seen in Room 27 and the patient's care was started a 4:39 PM.   Chief Complaint  Patient presents with  . Gynecologic Exam    HPI Past Medical History  Diagnosis Date  . Hypertension   . Cholesterol serum increased   . Thyroid disease   . Sleep apnea   . Migraine   . Depression   . Neuromuscular disorder   . GERD (gastroesophageal reflux disease)   . Anxiety    No Known Allergies Current Outpatient Prescriptions on File Prior to Visit  Medication Sig Dispense Refill  . amitriptyline (ELAVIL) 25 MG tablet TAKE 3 TABLET (75 MG TOTAL) BY MOUTH AT BEDTIME. 270 tablet 1  . cetirizine (ZYRTEC) 10 MG tablet Take 1 tablet (10 mg total) by mouth daily. 30 tablet 11  . citalopram (CELEXA) 40 MG tablet TAKE 1 TABLET (40 MG TOTAL) BY MOUTH DAILY 90 tablet 1  . clonazePAM (KLONOPIN) 0.5 MG tablet TAKE 1 TABLET BY MOUTH TWICE A DAY AS NEEDED FOR ANXIETY CAN TAKE 2 TABLETS AT BEDTIME FOR INSOMNIA 120 tablet 5  . fluticasone (FLONASE) 50 MCG/ACT nasal spray Place 2 sprays into both nostrils at bedtime. 16 g 6  . furosemide (LASIX) 40 MG tablet Take 1 tablet (40 mg total) by mouth 2 (two) times daily. 180 tablet 1  . gabapentin (NEURONTIN) 300 MG capsule Take 400 mg by mouth 3 (three) times daily.     Marland Kitchen levothyroxine (SYNTHROID, LEVOTHROID) 50 MCG tablet TAKE 1 TABLET (50 MCG TOTAL) BY MOUTH DAILY. 90 tablet 3  . omeprazole (PRILOSEC) 40 MG capsule Take 1 capsule (40 mg total) by mouth daily. 30 capsule 3  . potassium chloride SA (KLOR-CON M20) 20 MEQ tablet Take 1 tablet (20 mEq total) by mouth 2 (two) times daily. 180 tablet 1  . ranitidine (ZANTAC) 150 MG tablet Take 1 tablet (150 mg total) by mouth 3 times/day as needed-between meals & bedtime for heartburn. 60 tablet 11  . SUMAtriptan (IMITREX) 50 MG tablet  Take 1 tablet (50 mg total) by mouth every 2 (two) hours as needed for migraine. 10 tablet 3  . topiramate (TOPAMAX) 100 MG tablet Take 1 tablet (100 mg total) by mouth at bedtime. 90 tablet 3   No current facility-administered medications on file prior to visit.    HPI Comments: Jennifer Davidson is a 59 year old Female who reports to [facility] for a full physical. Pt states she is not sure when her last tetanus shot was. Pt has not had a pneumonia vaccine. Pt had a flu shot this fall. Pt states her mood has been better, but states that she has had two bad days in the past few weeks. Pt's states her older brother may have lung cancer for the second time, and believes that stressed her out. Pt states she has continued pack pain.     Review of Systems  Constitutional: Negative for fever and chills.  Genitourinary: Negative for vaginal bleeding and vaginal discharge.  Musculoskeletal: Positive for back pain.  Psychiatric/Behavioral: Negative for sleep disturbance.       Objective:   Physical Exam  Constitutional: She is oriented to person, place, and time. She appears well-developed and well-nourished. No distress.  Pt uses a walker.   HENT:  Head:  Normocephalic and atraumatic.  Right Ear: External ear normal.  Left Ear: External ear normal.  Eyes: Conjunctivae are normal.  Neck: Neck supple. No thyromegaly present.  Cardiovascular: Normal rate, regular rhythm and normal heart sounds.   No murmur heard. Pulmonary/Chest: Effort normal and breath sounds normal. No respiratory distress. She has no wheezes.  Abdominal: Soft.  Musculoskeletal: Normal range of motion.  Lymphadenopathy:    She has no cervical adenopathy.  Neurological: She is alert and oriented to person, place, and time.  Skin: Skin is warm and dry. She is not diaphoretic. No erythema.  Psychiatric: She has a normal mood and affect. Her behavior is normal.   BP 125/78 mmHg  Pulse 94  Temp(Src) 98.2 F (36.8 C)  Resp 16   Wt 211 lb (95.709 kg)  SpO2 97%      Assessment & Plan:   Need for shingles vaccine - Plan: zoster vaccine live, PF, (ZOSTAVAX) [id number] UNT/0.65ML injection  Need for prophylactic vaccination against Streptococcus pneumoniae (pneumococcus) - Plan: Pneumococcal polysaccharide vaccine 23-valent greater than or equal to 2yo subcutaneous/IM  Meds ordered this encounter  Medications  . zoster vaccine live, PF, (ZOSTAVAX) [id number] UNT/0.65ML injection    Sig: Inject 19,400 Units into the skin once.    Dispense:  1 each    Refill:  0   I personally performed the services described in this documentation, which was scribed in my presence. The recorded information has been reviewed and considered, and addended by me as needed.  Norberto SorensonEva Eren Puebla, MD MPH

## 2014-07-09 DIAGNOSIS — Z0271 Encounter for disability determination: Secondary | ICD-10-CM

## 2014-07-29 ENCOUNTER — Other Ambulatory Visit: Payer: Self-pay | Admitting: Nephrology

## 2014-07-29 DIAGNOSIS — N183 Chronic kidney disease, stage 3 unspecified: Secondary | ICD-10-CM

## 2014-07-29 DIAGNOSIS — N1339 Other hydronephrosis: Secondary | ICD-10-CM

## 2014-07-31 ENCOUNTER — Ambulatory Visit
Admission: RE | Admit: 2014-07-31 | Discharge: 2014-07-31 | Disposition: A | Discharge status: Home / Self Care | Payer: Managed Care, Other (non HMO) | Source: Ambulatory Visit | Attending: Nephrology | Admitting: Nephrology

## 2014-07-31 DIAGNOSIS — G709 Myoneural disorder, unspecified: Secondary | ICD-10-CM | POA: Insufficient documentation

## 2014-07-31 DIAGNOSIS — N2581 Secondary hyperparathyroidism of renal origin: Secondary | ICD-10-CM

## 2014-07-31 DIAGNOSIS — N183 Chronic kidney disease, stage 3 unspecified: Secondary | ICD-10-CM

## 2014-07-31 DIAGNOSIS — N1339 Other hydronephrosis: Secondary | ICD-10-CM

## 2014-07-31 HISTORY — DX: Secondary hyperparathyroidism of renal origin: N25.81

## 2014-07-31 LAB — HEPATIC FUNCTION PANEL: ALT: 24 U/L (ref 7–35)

## 2014-07-31 LAB — BASIC METABOLIC PANEL
Creatinine: 1.3 mg/dL — AB (ref 0.5–1.1)
GLUCOSE: 103 mg/dL
POTASSIUM: 3.9 mmol/L (ref 3.4–5.3)

## 2014-08-22 ENCOUNTER — Other Ambulatory Visit: Payer: Self-pay | Admitting: Family Medicine

## 2014-08-22 DIAGNOSIS — Z1231 Encounter for screening mammogram for malignant neoplasm of breast: Secondary | ICD-10-CM

## 2014-08-28 ENCOUNTER — Other Ambulatory Visit: Payer: Self-pay | Admitting: Nephrology

## 2014-08-28 ENCOUNTER — Ambulatory Visit (HOSPITAL_COMMUNITY)
Admission: RE | Admit: 2014-08-28 | Discharge: 2014-08-28 | Disposition: A | Discharge status: Home / Self Care | Payer: Managed Care, Other (non HMO) | Source: Ambulatory Visit | Attending: Family Medicine | Admitting: Family Medicine

## 2014-08-28 DIAGNOSIS — Z1231 Encounter for screening mammogram for malignant neoplasm of breast: Secondary | ICD-10-CM | POA: Diagnosis present

## 2014-08-28 DIAGNOSIS — N183 Chronic kidney disease, stage 3 unspecified: Secondary | ICD-10-CM

## 2014-08-29 ENCOUNTER — Telehealth: Payer: Self-pay

## 2014-08-29 NOTE — Telephone Encounter (Signed)
THIS MESSAGE IS FROM June REED (CASE MANAGER) FOR Jennifer Davidson. SHE WOULD LIKE TO LET DR. SHAW KNOW THAT SHE IS FAXING OVER A TREATMENT PLAN OF CARE THAT NEEDS TO BE FILLED OUT BY HER. IF THERE ARE ANY QUESTIONS PLEASE FEEL FREE TO CONTACT HER. BEST PHONE [phone] EXT. [phone]   FAX # [phone] (CASE MANAGER'S NAME IS June REED) MBC

## 2014-09-02 ENCOUNTER — Ambulatory Visit
Admission: RE | Admit: 2014-09-02 | Discharge: 2014-09-02 | Disposition: A | Discharge status: Home / Self Care | Payer: Managed Care, Other (non HMO) | Source: Ambulatory Visit | Attending: Nephrology | Admitting: Nephrology

## 2014-09-02 DIAGNOSIS — N183 Chronic kidney disease, stage 3 unspecified: Secondary | ICD-10-CM

## 2014-09-03 NOTE — Telephone Encounter (Signed)
Not sure what this is in reference to. Treatment for what?  Just in general? This is a ridiculous and irrelevant request. Can you just send them a copy of my last note - that is our current treatment plan.  I have no idea who her case manager is - is this a Education officer, museum who is actually going out to the pt's house?  Or is this just an insurance "case manager" who is asking me to do her job for her?  If the patient has actually met June Reed then I will do it.  I think I put the form in the nurse basket a couple of days ago but I just checked and is no longer in my basket.

## 2014-09-05 NOTE — Telephone Encounter (Signed)
We will await fax to get more information.

## 2014-09-30 ENCOUNTER — Encounter: Payer: Self-pay | Admitting: *Deleted

## 2014-09-30 DIAGNOSIS — N183 Chronic kidney disease, stage 3 unspecified: Secondary | ICD-10-CM

## 2014-09-30 DIAGNOSIS — G709 Myoneural disorder, unspecified: Secondary | ICD-10-CM

## 2014-09-30 DIAGNOSIS — N2581 Secondary hyperparathyroidism of renal origin: Secondary | ICD-10-CM

## 2014-09-30 NOTE — Assessment & Plan Note (Signed)
>>  ASSESSMENT AND PLAN FOR CHRONIC KIDNEY DISEASE WRITTEN ON 09/30/2014  3:43 PM BY MALPASS, DANA, RN  Stage G3a/A3, moderately decreased GFR between 45-59 ml/min/1.73 sqare meter and albuminuria creatinine ratio greater than 300 mg/g (N18.3)  Plan:  Tight BP control with goal < 130/80; avoidance of nephrotoxic agents such as NSAID's/Cox II I's/IV contrasted material

## 2014-09-30 NOTE — Assessment & Plan Note (Signed)
Stage G3a/A3, moderately decreased GFR between 45-59 ml/min/1.73 sqare meter and albuminuria creatinine ratio greater than 300 mg/g (N18.3)  Plan:  Tight BP control with goal < 130/80; avoidance of nephrotoxic agents such as NSAID's/Cox II I's/IV contrasted material

## 2014-10-27 ENCOUNTER — Other Ambulatory Visit: Payer: Self-pay | Admitting: Family Medicine

## 2014-11-02 ENCOUNTER — Other Ambulatory Visit: Payer: Self-pay | Admitting: Family Medicine

## 2014-11-08 ENCOUNTER — Encounter: Payer: Self-pay | Admitting: Nurse Practitioner

## 2014-11-08 ENCOUNTER — Ambulatory Visit (INDEPENDENT_AMBULATORY_CARE_PROVIDER_SITE_OTHER): Payer: Managed Care, Other (non HMO) | Admitting: Family Medicine

## 2014-11-08 VITALS — BP 126/81 | HR 74 | Temp 98.1°F | Resp 16 | Ht 62.0 in | Wt 209.6 lb

## 2014-11-08 DIAGNOSIS — E039 Hypothyroidism, unspecified: Secondary | ICD-10-CM

## 2014-11-08 DIAGNOSIS — I10 Essential (primary) hypertension: Secondary | ICD-10-CM

## 2014-11-08 DIAGNOSIS — N183 Chronic kidney disease, stage 3 unspecified: Secondary | ICD-10-CM

## 2014-11-08 DIAGNOSIS — K219 Gastro-esophageal reflux disease without esophagitis: Secondary | ICD-10-CM | POA: Diagnosis not present

## 2014-11-08 DIAGNOSIS — Z79899 Other long term (current) drug therapy: Secondary | ICD-10-CM

## 2014-11-08 DIAGNOSIS — G43809 Other migraine, not intractable, without status migrainosus: Secondary | ICD-10-CM | POA: Diagnosis not present

## 2014-11-08 DIAGNOSIS — E785 Hyperlipidemia, unspecified: Secondary | ICD-10-CM

## 2014-11-08 DIAGNOSIS — F418 Other specified anxiety disorders: Secondary | ICD-10-CM

## 2014-11-08 DIAGNOSIS — F419 Anxiety disorder, unspecified: Secondary | ICD-10-CM

## 2014-11-08 DIAGNOSIS — M5441 Lumbago with sciatica, right side: Secondary | ICD-10-CM

## 2014-11-08 DIAGNOSIS — F32A Depression, unspecified: Secondary | ICD-10-CM

## 2014-11-08 DIAGNOSIS — R55 Syncope and collapse: Secondary | ICD-10-CM | POA: Diagnosis not present

## 2014-11-08 DIAGNOSIS — F329 Major depressive disorder, single episode, unspecified: Secondary | ICD-10-CM

## 2014-11-08 DIAGNOSIS — R609 Edema, unspecified: Secondary | ICD-10-CM | POA: Diagnosis not present

## 2014-11-08 DIAGNOSIS — G44209 Tension-type headache, unspecified, not intractable: Secondary | ICD-10-CM | POA: Diagnosis not present

## 2014-11-08 DIAGNOSIS — M5442 Lumbago with sciatica, left side: Secondary | ICD-10-CM

## 2014-11-08 LAB — POCT URINALYSIS DIPSTICK
BILIRUBIN UA: NEGATIVE
Blood, UA: NEGATIVE
GLUCOSE UA: NEGATIVE
KETONES UA: NEGATIVE
Leukocytes, UA: NEGATIVE
Nitrite, UA: NEGATIVE
PH UA: 7
Protein, UA: NEGATIVE
Spec Grav, UA: 1.01
Urobilinogen, UA: 0.2

## 2014-11-08 LAB — POCT UA - MICROSCOPIC ONLY
Bacteria, U Microscopic: NEGATIVE
CASTS, UR, LPF, POC: NEGATIVE
Crystals, Ur, HPF, POC: NEGATIVE
MUCUS UA: NEGATIVE
RBC, urine, microscopic: NEGATIVE
WBC, Ur, HPF, POC: NEGATIVE
YEAST UA: NEGATIVE

## 2014-11-08 LAB — POCT CBC
Granulocyte percent: 53.4 %G (ref 37–80)
HEMATOCRIT: 39 % (ref 37.7–47.9)
Hemoglobin: 12.7 g/dL (ref 12.2–16.2)
LYMPH, POC: 2.7 (ref 0.6–3.4)
MCH, POC: 28 pg (ref 27–31.2)
MCHC: 32.6 g/dL (ref 31.8–35.4)
MCV: 85.9 fL (ref 80–97)
MID (CBC): 0.7 (ref 0–0.9)
MPV: 6.3 fL (ref 0–99.8)
POC Granulocyte: 3.8 (ref 2–6.9)
POC LYMPH %: 37.4 % (ref 10–50)
POC MID %: 9.2 %M (ref 0–12)
Platelet Count, POC: 298 10*3/uL (ref 142–424)
RBC: 4.54 M/uL (ref 4.04–5.48)
RDW, POC: 15.1 %
WBC: 7.1 10*3/uL (ref 4.6–10.2)

## 2014-11-08 LAB — POCT GLYCOSYLATED HEMOGLOBIN (HGB A1C): Hemoglobin A1C: 5.8

## 2014-11-08 MED ORDER — TOPIRAMATE 100 MG PO TABS: 100.0000 mg | ORAL_TABLET | Freq: Every day | ORAL | Status: DC

## 2014-11-08 MED ORDER — CLONAZEPAM 0.5 MG PO TABS: ORAL_TABLET | ORAL | Status: DC

## 2014-11-08 MED ORDER — ESOMEPRAZOLE MAGNESIUM 40 MG PO CPDR: 40.0000 mg | DELAYED_RELEASE_CAPSULE | Freq: Every day | ORAL | Status: DC

## 2014-11-08 MED ORDER — CITALOPRAM HYDROBROMIDE 10 MG PO TABS: 20.0000 mg | ORAL_TABLET | Freq: Every day | ORAL | Status: DC

## 2014-11-08 MED ORDER — BUPROPION HCL ER (XL) 150 MG PO TB24: 150.0000 mg | ORAL_TABLET | Freq: Every day | ORAL | Status: DC

## 2014-11-08 NOTE — Patient Instructions (Signed)
Cut your citalopram in half.  Lets decrease your lasix to just once a day instead of twice.  Start the wellbutrin every morning. Continue 2 tabs of the trazodone in the evening.  Use the klonopin as needed - you have been on this for a while so if you need a temporary dose increase while we are straigtening out your medication let me know.  Call me in 2-3 weeks to let me know how you are doing and we can decide where we want to go with your medicines from there.  Have your pharmacy send in a request for your lasix whenever you need it. Food Basics for Chronic Kidney Disease When your kidneys are not working well, they cannot remove waste and excess substances from your blood as effectively as they did before. This can lead to a buildup and imbalance of these substances, which can affect how your body functions. This buildup can also make your kidneys work harder, causing even more damage. You may need to eat less of certain foods that can lead to the buildup of these substances in your body. By making the changes to your diet that are recommended by your dietitian or health care provider, you could possibly help prevent further kidney damage and delay or prevent the need for dialysis. The following information can help give you a basic understanding of these substances and how they affect your bodily functions. The information also gives examples of foods that contain the highest amounts of these substances. WHAT DO I NEED TO KNOW ABOUT SUBSTANCES IN MY FOOD THAT I MAY NEED TO ADJUST? Food adjustments will be different for each person with chronic kidney disease. It is important that you see a dietitian who can help you determine the specific adjustments that you will need to make for each of the following substances: Potassium Potassium affects how steadily your heart beats. If too much potassium builds up in your blood, it can cause an irregular heartbeat or even a heart attack. Examples of foods rich in  potassium include:  Milk.  Fruits.  Vegetables. Phosphorus Phosphorus is a mineral found in your bones. A balance between calcium and phosphorous is needed to build and maintain healthy bones. Too much phosphorus pulls calcium from your bones. This can make your bones weak and more likely to break. Too much phosphorus can also make your skin itch. Examples of foods rich in phosphorus include:  Milk and cheese.  Dried beans.  Peas.  Colas.  Nuts and peanut butter. Animal Protein Animal protein helps you make and keep muscle. It also helps in the repair of your body's cells and tissues. One of the natural breakdown products of protein is a waste product called urea. When your kidneys are not working properly, they cannot remove wastes such as urea like they did before you developed chronic kidney disease. You will likely need to limit the amount of protein you eat to help prevent a buildup of urea in your blood. Examples of animal protein include:  Meat (all types).  Fish and seafood.  Poultry.  Eggs. Sodium Sodium, which is found in salt, helps maintain a healthy balance of fluids in your body. Too much sodium can increase your blood pressure level and have a negative affect on the function of your heart and lungs. Too much sodium also can cause your body to retain too much fluid, making your kidneys work harder. Examples of foods with high levels of sodium include:  Salt seasonings.  Soy  sauce.  Cured and processed meats.  Salted crackers and snack foods.  Fast food.  Canned soups and most canned foods. Glucose Glucose provides energy for your body. If you have diabetes mellitus that is not properly controlled, you have too much glucose in your blood. Too much glucose in your blood can worsen the function of your kidneys by damaging small blood vessels. This prevents enough blood flow to your kidneys to give them what they need to work. If you have diabetes mellitus and  chronic kidney disease, it is important to maintain your blood glucose at a level recommended by your health care provider. SHOULD I TAKE A VITAMIN AND MINERAL SUPPLEMENT? Because you may need to avoid eating certain foods, you may not get all of the vitamins and minerals that would normally come from those foods. Your health care provider or dietitian may recommend that you take a supplement to ensure that you get all of the vitamins and minerals that your body needs.  Document Released: 07/24/2002 Document Revised: 09/17/2013 Document Reviewed: 03/30/2013 [facility] Patient Information 2015 [place], [place]. This information is not intended to replace advice given to you by your health care provider. Make sure you discuss any questions you have with your health care provider.  Food Choices for Gastroesophageal Reflux Disease When you have gastroesophageal reflux disease (GERD), the foods you eat and your eating habits are very important. Choosing the right foods can help ease the discomfort of GERD. WHAT GENERAL GUIDELINES DO I NEED TO FOLLOW?  Choose fruits, vegetables, whole grains, low-fat dairy products, and low-fat meat, fish, and poultry.  Limit fats such as oils, salad dressings, butter, nuts, and avocado.  Keep a food diary to identify foods that cause symptoms.  Avoid foods that cause reflux. These may be different for different people.  Eat frequent small meals instead of three large meals each day.  Eat your meals slowly, in a relaxed setting.  Limit fried foods.  Cook foods using methods other than frying.  Avoid drinking alcohol.  Avoid drinking large amounts of liquids with your meals.  Avoid bending over or lying down until 2-3 hours after eating. WHAT FOODS ARE NOT RECOMMENDED? The following are some foods and drinks that may worsen your symptoms: Vegetables Tomatoes. Tomato juice. Tomato and spaghetti sauce. Chili peppers. Onion and garlic.  Horseradish. Fruits Oranges, grapefruit, and lemon (fruit and juice). Meats High-fat meats, fish, and poultry. This includes hot dogs, ribs, ham, sausage, salami, and bacon. Dairy Whole milk and chocolate milk. Sour cream. Cream. Butter. Ice cream. Cream cheese.  Beverages Coffee and tea, with or without caffeine. Carbonated beverages or energy drinks. Condiments Hot sauce. Barbecue sauce.  Sweets/Desserts Chocolate and cocoa. Donuts. Peppermint and spearmint. Fats and Oils High-fat foods, including [place] fries and potato chips. Other Vinegar. Strong spices, such as black pepper, white pepper, red pepper, cayenne, curry powder, cloves, ginger, and chili powder. The items listed above may not be a complete list of foods and beverages to avoid. Contact your dietitian for more information. Document Released: 05/03/2005 Document Revised: 05/08/2013 Document Reviewed: 03/07/2013 [facility] Patient Information 2015 [place], [place]. This information is not intended to replace advice given to you by your health care provider. Make sure you discuss any questions you have with your health care provider.  Potassium Content of Foods Potassium is a mineral found in many foods and drinks. It helps keep fluids and minerals balanced in your body and affects how steadily your heart beats. Potassium also helps control  your blood pressure and keep your muscles and nervous system healthy. Certain health conditions and medicines may change the balance of potassium in your body. When this happens, you can help balance your level of potassium through the foods that you do or do not eat. Your health care provider or dietitian may recommend an amount of potassium that you should have each day. The following lists of foods provide the amount of potassium (in parentheses) per serving in each item. HIGH IN POTASSIUM  The following foods and beverages have 200 mg or more of potassium per serving:  Apricots, 2 raw or 5  dry (200 mg).  Artichoke, 1 medium (345 mg).  Avocado, raw,  each (245 mg).  Banana, 1 medium (425 mg).  Beans, lima, or baked beans, canned,  cup (280 mg).  Beans, white, canned,  cup (595 mg).  Beef roast, 3 oz (320 mg).  Beef, ground, 3 oz (270 mg).  Beets, raw or cooked,  cup (260 mg).  Bran muffin, 2 oz (300 mg).  Broccoli,  cup (230 mg).  Brussels sprouts,  cup (250 mg).  Cantaloupe,  cup (215 mg).  Cereal, 100% bran,  cup (200-400 mg).  Cheeseburger, single, fast food, 1 each (225-400 mg).  Chicken, 3 oz (220 mg).  Clams, canned, 3 oz (535 mg).  Crab, 3 oz (225 mg).  Dates, 5 each (270 mg).  Dried beans and peas,  cup (300-475 mg).  Figs, dried, 2 each (260 mg).  Fish: halibut, tuna, cod, snapper, 3 oz (480 mg).  Fish: salmon, haddock, swordfish, perch, 3 oz (300 mg).  Fish, tuna, canned 3 oz (200 mg).  [place] fries, fast food, 3 oz (470 mg).  Granola with fruit and nuts,  cup (200 mg).  Grapefruit juice,  cup (200 mg).  Greens, beet,  cup (655 mg).  Honeydew melon,  cup (200 mg).  Kale, raw, 1 cup (300 mg).  Kiwi, 1 medium (240 mg).  Kohlrabi, rutabaga, parsnips,  cup (280 mg).  Lentils,  cup (365 mg).  Mango, 1 each (325 mg).  Milk, chocolate, 1 cup (420 mg).  Milk: nonfat, low-fat, whole, buttermilk, 1 cup (350-380 mg).  Molasses, 1 Tbsp (295 mg).  Mushrooms,  cup (280) mg.  Nectarine, 1 each (275 mg).  Nuts: almonds, peanuts, hazelnuts, [place], cashew, mixed, 1 oz (200 mg).  Nuts, pistachios, 1 oz (295 mg).  Orange, 1 each (240 mg).  Orange juice,  cup (235 mg).  Papaya, medium,  fruit (390 mg).  Peanut butter, chunky, 2 Tbsp (240 mg).  Peanut butter, smooth, 2 Tbsp (210 mg).  Pear, 1 medium (200 mg).  Pomegranate, 1 whole (400 mg).  Pomegranate juice,  cup (215 mg).  Pork, 3 oz (350 mg).  Potato chips, salted, 1 oz (465 mg).  Potato, baked with skin, 1 medium (925 mg).  Potatoes,  boiled,  cup (255 mg).  Potatoes, mashed,  cup (330 mg).  Prune juice,  cup (370 mg).  Prunes, 5 each (305 mg).  Pudding, chocolate,  cup (230 mg).  Pumpkin, canned,  cup (250 mg).  Raisins, seedless,  cup (270 mg).  Seeds, sunflower or pumpkin, 1 oz (240 mg).  Soy milk, 1 cup (300 mg).  Spinach,  cup (420 mg).  Spinach, canned,  cup (370 mg).  Sweet potato, baked with skin, 1 medium (450 mg).  Swiss chard,  cup (480 mg).  Tomato or vegetable juice,  cup (275 mg).  Tomato sauce or puree,  cup (400-550 mg).  Tomato, raw, 1 medium (290 mg).  Tomatoes, canned,  cup (200-300 mg).  [place], 3 oz (250 mg).  Wheat germ, 1 oz (250 mg).  Winter squash,  cup (250 mg).  Yogurt, plain or fruited, 6 oz (260-435 mg).  Zucchini,  cup (220 mg). MODERATE IN POTASSIUM The following foods and beverages have 50-200 mg of potassium per serving:  Apple, 1 each (150 mg).  Apple juice,  cup (150 mg).  Applesauce,  cup (90 mg).  Apricot nectar,  cup (140 mg).  Asparagus, small spears,  cup or 6 spears (155 mg).  Bagel, cinnamon raisin, 1 each (130 mg).  Bagel, egg or plain, 4 in., 1 each (70 mg).  Beans, green,  cup (90 mg).  Beans, yellow,  cup (190 mg).  Beer, regular, 12 oz (100 mg).  Beets, canned,  cup (125 mg).  Blackberries,  cup (115 mg).  Blueberries,  cup (60 mg).  Bread, whole wheat, 1 slice (70 mg).  Broccoli, raw,  cup (145 mg).  Cabbage,  cup (150 mg).  Carrots, cooked or raw,  cup (180 mg).  Cauliflower, raw,  cup (150 mg).  Celery, raw,  cup (155 mg).  Cereal, bran flakes, cup (120-150 mg).  Cheese, cottage,  cup (110 mg).  Cherries, 10 each (150 mg).  Chocolate, 1 oz bar (165 mg).  Coffee, brewed 6 oz (90 mg).  Corn,  cup or 1 ear (195 mg).  Cucumbers,  cup (80 mg).  Egg, large, 1 each (60 mg).  Eggplant,  cup (60 mg).  Endive, raw, cup (80 mg).  English muffin, 1 each (65 mg).  Fish,  orange roughy, 3 oz (150 mg).  Frankfurter, beef or pork, 1 each (75 mg).  Fruit cocktail,  cup (115 mg).  Grape juice,  cup (170 mg).  Grapefruit,  fruit (175 mg).  Grapes,  cup (155 mg).  Greens: kale, turnip, collard,  cup (110-150 mg).  Ice cream or frozen yogurt, chocolate,  cup (175 mg).  Ice cream or frozen yogurt, vanilla,  cup (120-150 mg).  Lemons, limes, 1 each (80 mg).  Lettuce, all types, 1 cup (100 mg).  Mixed vegetables,  cup (150 mg).  Mushrooms, raw,  cup (110 mg).  Nuts: walnuts, pecans, or macadamia, 1 oz (125 mg).  Oatmeal,  cup (80 mg).  Okra,  cup (110 mg).  Onions, raw,  cup (120 mg).  Peach, 1 each (185 mg).  Peaches, canned,  cup (120 mg).  Pears, canned,  cup (120 mg).  Peas, green, frozen,  cup (90 mg).  Peppers, green,  cup (130 mg).  Peppers, red,  cup (160 mg).  Pineapple juice,  cup (165 mg).  Pineapple, fresh or canned,  cup (100 mg).  Plums, 1 each (105 mg).  Pudding, vanilla,  cup (150 mg).  Raspberries,  cup (90 mg).  Rhubarb,  cup (115 mg).  Rice, wild,  cup (80 mg).  Shrimp, 3 oz (155 mg).  Spinach, raw, 1 cup (170 mg).  Strawberries,  cup (125 mg).  Summer squash  cup (175-200 mg).  Swiss chard, raw, 1 cup (135 mg).  Tangerines, 1 each (140 mg).  Tea, brewed, 6 oz (65 mg).  Turnips,  cup (140 mg).  Watermelon,  cup (85 mg).  Wine, red, table, 5 oz (180 mg).  Wine, white, table, 5 oz (100 mg). LOW IN POTASSIUM The following foods and beverages have less than 50 mg of potassium per serving.  Bread, white, 1 slice (30 mg).  Carbonated beverages, 12 oz (less than 5 mg).  Cheese, 1 oz (20-30 mg).  Cranberries,  cup (45 mg).  Cranberry juice cocktail,  cup (20 mg).  Fats and oils, 1 Tbsp (less than 5 mg).  Hummus, 1 Tbsp (32 mg).  Nectar: papaya, mango, or pear,  cup (35 mg).  Rice, white or brown,  cup (50 mg).  Spaghetti or macaroni,  cup cooked (30  mg).  Tortilla, flour or corn, 1 each (50 mg).  Waffle, 4 in., 1 each (50 mg).  Water chestnuts,  cup (40 mg). Document Released: 12/15/2004 Document Revised: 05/08/2013 Document Reviewed: 03/30/2013 [facility] Patient Information 2015 [place], [place]. This information is not intended to replace advice given to you by your health care provider. Make sure you discuss any questions you have with your health care provider.

## 2014-11-08 NOTE — Progress Notes (Addendum)
Subjective:    Patient ID: Jennifer Davidson, female    DOB: Aug 01, 1955, 59 y.o.   MRN: [id number] This chart was scribed for Norberto Sorenson, MD by Littie Deeds, Medical Scribe. This patient was seen in Room 25 and the patient's care was started at 12:17 PM.   Chief Complaint  Patient presents with  . Follow-up    6 months  . Anxiety  . Depression  . Hypothyroidism  . Headache  . Hypertension    HPI HPI Comments: Jennifer Davidson is a 59 y.o. female with a hx chronic kidney disease who presents to the [facility] for a follow-up. Patient is fasting today, she has only had water.  Patient had a worker's comp injury prior from which she became significantly disabled as well as chronic pain. She's had several lumbar surgeries in the past 5 years. Unfortunately, she developed stage III renal failure from overuse of NSAIDs which she was using in effort to treat her pain. Also, the disability and fights with worker's comp to get adequate treatment resulted in a large amount of depression and anxiety as she was no longer able to participate in any parts of her life. Fortunately, within the past year, she was able to get a different neurosurgical consult with improved pain control with injections. After her pain decreased, her mood symptoms resolved.  Fall: Patient had a fall 5 days ago. She believes this was attributed to being anxious and more exhausted. She has a new puppy at home and had also been babysitting her daughter's puppy that day. It was thought that she had been trying to do too much. She had been feeling shaky and lightheaded. Patient had gone outside and had been standing for a few minutes before becoming lightheaded and syncopal prior to falling. Jennifer Davidson tried to stop her from falling, but she ended up pulling him down too because it happened suddenly. She has noticed more lightheadedness lately.  Sleep: Patient has had some difficulty with sleep for a while. She was recently  changed from amitriptyline to trazodone by Dr. Manon Hilding. She started with 1 tablet a day and has increased to 2 tablets a day.  Depression/Anxiety: Patient has felt increased depression and anxiety for the past month. She attributes this to life and family stressors, stating there has been a lot going on with her grandchildren. She also attributes this to being overall weaker and more fatigued. Patient denies panic. She has not been on Wellbutrin, Zoloft, Paxil or Cymbalta previously.  Acid Reflux: Patient states she has been having more severe acid reflux symptoms lately.  Headaches: Patient does report having intermittent headaches. She states she has only had 1 migraine headache with multiple less severe headaches. She has been taking Excedrin, 2 tablets at a time, for more severe headaches which has been providing relief to her headaches. She does not take Excedrin for headaches more than twice a week.  Back Pain: Patient had a shot in her back 2 months ago, after insurance finally gave approval. This gave her significant relief to her back pain and is to repeat the shot after 6 months. The back pain has been gradually coming back.  Chronic Kidney Disease: Patient has been seeing a nephrologist, Dr. Arrie Aran.  Medications: Patient denies any problems with Lasix.  Past Medical History  Diagnosis Date  . Hypertension   . Cholesterol serum increased   . Thyroid disease   . Sleep apnea   . Migraine   .  Depression   . Neuromuscular disorder   . GERD (gastroesophageal reflux disease)   . Anxiety    Past Surgical History  Procedure Laterality Date  . Abdominal hysterectomy    . Back surgery    . Appendectomy     Current Outpatient Prescriptions on File Prior to Visit  Medication Sig Dispense Refill  . cetirizine (ZYRTEC) 10 MG tablet Take 1 tablet (10 mg total) by mouth daily. 30 tablet 11  . furosemide (LASIX) 40 MG tablet TAKE 1 TABLET BY MOUTH TWICE A DAY 60 tablet 0  . gabapentin  (NEURONTIN) 300 MG capsule Take 400 mg by mouth 3 (three) times daily.     Marland Kitchen levothyroxine (SYNTHROID, LEVOTHROID) 50 MCG tablet TAKE 1 TABLET (50 MCG TOTAL) BY MOUTH DAILY. 90 tablet 3  . potassium chloride SA (KLOR-CON M20) 20 MEQ tablet Take 1 tablet (20 mEq total) by mouth 2 (two) times daily. 180 tablet 1  . ranitidine (ZANTAC) 150 MG tablet Take 1 tablet (150 mg total) by mouth 3 times/day as needed-between meals & bedtime for heartburn. 60 tablet 11  . SUMAtriptan (IMITREX) 50 MG tablet Take 1 tablet (50 mg total) by mouth every 2 (two) hours as needed for migraine. 10 tablet 3  . zoster vaccine live, PF, (ZOSTAVAX) 16109 UNT/0.65ML injection Inject 19,400 Units into the skin once. 1 each 0  . fluticasone (FLONASE) 50 MCG/ACT nasal spray Place 2 sprays into both nostrils at bedtime. 16 g 6   No current facility-administered medications on file prior to visit.   No Known Allergies Family History  Problem Relation Age of Onset  . Diabetes Mother   . Heart disease Mother   . Kidney disease Mother   . Thyroid disease Mother   . Heart disease Father   . Seizures Father   . COPD Father   . Cancer Maternal Grandmother    History   Social History  . Marital Status: Married    Spouse Name: N/A  . Number of Children: N/A  . Years of Education: N/A   Social History Main Topics  . Smoking status: Never Smoker   . Smokeless tobacco: Never Used  . Alcohol Use: No  . Drug Use: No  . Sexual Activity:    Partners: Male     Comment: married   Other Topics Concern  . Not on file   Social History Narrative     Review of Systems  Constitutional: Positive for activity change and fatigue. Negative for fever, chills, appetite change and unexpected weight change.  Gastrointestinal: Negative for nausea, vomiting, abdominal pain, diarrhea and constipation.  Genitourinary: Negative for urgency, frequency, decreased urine volume and difficulty urinating.  Musculoskeletal: Positive for  myalgias, back pain, arthralgias and gait problem. Negative for joint swelling.  Allergic/Immunologic: Negative for immunocompromised state.  Neurological: Positive for syncope, weakness, light-headedness and headaches. Negative for dizziness, facial asymmetry and numbness.  Hematological: Negative for adenopathy. Does not bruise/bleed easily.  Psychiatric/Behavioral: Positive for sleep disturbance and dysphoric mood. The patient is nervous/anxious.        Objective:  BP 106/66 mmHg  Pulse 84  Temp(Src) 98.1 F (36.7 C) (Oral)  Resp 16  Ht  (1.575 m)  Wt 209 lb 9.6 oz (95.074 kg)  BMI 38.33 kg/m2  SpO2 93%  Physical Exam  Constitutional: She is oriented to person, place, and time. She appears well-developed and well-nourished. No distress.  HENT:  Head: Normocephalic and atraumatic.  Mouth/Throat: Oropharynx is clear and moist. No  oropharyngeal exudate.  Eyes: Pupils are equal, round, and reactive to light.  Neck: Neck supple.  Cardiovascular: Normal rate, regular rhythm, S1 normal and S2 normal.   Murmur heard.  Systolic murmur is present with a grade of 2/6  2/6 systolic murmur, left upper sternal border.  Pulmonary/Chest: Effort normal and breath sounds normal. No respiratory distress. She has no wheezes. She has no rales.  Clear to auscultation bilaterally. Bowel sounds heard in chest.  Neurological: She is alert and oriented to person, place, and time. No cranial nerve deficit.  Skin: Skin is warm and dry. No rash noted.  Psychiatric: She has a normal mood and affect. Her behavior is normal.  Vitals reviewed.  Negative orthostatics. Normal EKG.     Assessment & Plan:   1. Syncope and collapse   2. Essential hypertension   3. Hypothyroidism, unspecified hypothyroidism type   4. Chronic kidney disease, stage 3 (moderate) - seeing nephrology  5. Anxiety and depression - decrease citalopram from 40 to 20 and start qam wellbutrin. Cont trazodone 100 qhs with prn  klonopin - ok to increase use some if it helps.  6. Edema - decrease lasix to qd rather than bid.  7. Encounter for long-term (current) use of medications   8. Hyperlipidemia   9. Bilateral low back pain with sciatica, sciatica laterality unspecified - Seen by Dr. Jethro Bolus - pt had first in severies of epidural injections with a HUGE improvement in sxs - are now gradually returning after a mo or two but looking forward to the next  10. Gastroesophageal reflux disease, esophagitis presence not specified   11. Other migraine without status migrainosus, not intractable   12. Tension headache     Orders Placed This Encounter  Procedures  . Lipid panel    Order Specific Question:  Has the patient fasted?    Answer:  Yes  . Comprehensive metabolic panel    Order Specific Question:  Has the patient fasted?    Answer:  Yes  . TSH  . Ambulatory referral to Gastroenterology    Referral Priority:  Routine    Referral Type:  Consultation    Referral Reason:  Specialty Services Required    Number of Visits Requested:  1  . POCT CBC  . POCT UA - Microscopic Only  . POCT urinalysis dipstick  . POCT glycosylated hemoglobin (Hb A1C)  . EKG 12-Lead    Meds ordered this encounter  Medications  . traZODone (DESYREL) 50 MG tablet    Sig: Take 50 mg by mouth at bedtime.  . citalopram (CELEXA) 10 MG tablet    Sig: Take 2 tablets (20 mg total) by mouth daily. And taper off as directed by physician    Dispense:  60 tablet    Refill:  1  . buPROPion (WELLBUTRIN XL) 150 MG 24 hr tablet    Sig: Take 1 tablet (150 mg total) by mouth daily.    Dispense:  30 tablet    Refill:  0  . topiramate (TOPAMAX) 100 MG tablet    Sig: Take 1 tablet (100 mg total) by mouth at bedtime.    Dispense:  90 tablet    Refill:  3  . clonazePAM (KLONOPIN) 0.5 MG tablet    Sig: TAKE 1 TABLET BY MOUTH TWICE A DAY AS NEEDED FOR ANXIETY CAN TAKE 2 TABLETS AT BEDTIME FOR INSOMNIA    Dispense:  120 tablet    Refill:  5  .  esomeprazole (NEXIUM) 40 MG  capsule    Sig: Take 1 capsule (40 mg total) by mouth daily.    Dispense:  30 capsule    Refill:  3    I personally performed the services described in this documentation, which was scribed in my presence. The recorded information has been reviewed and considered, and addended by me as needed.  Norberto Sorenson, MD MPH   Results for orders placed or performed in visit on 11/08/14  Lipid panel  Result Value Ref Range   Cholesterol 258 (H) 0 - 200 mg/dL   Triglycerides 456 (H) <150 mg/dL   HDL 39 (L) >=25 mg/dL   Total CHOL/HDL Ratio 6.6 Ratio   VLDL NOT CALC 0 - 40 mg/dL   LDL Cholesterol NOT CALC 0 - 99 mg/dL  Comprehensive metabolic panel  Result Value Ref Range   Sodium 140 135 - 145 mEq/L   Potassium 4.2 3.5 - 5.3 mEq/L   Chloride 98 96 - 112 mEq/L   CO2 30 19 - 32 mEq/L   Glucose, Bld 98 70 - 99 mg/dL   BUN 16 6 - 23 mg/dL   Creat 6.38 (H) 9.37 - 1.10 mg/dL   Total Bilirubin 0.4 0.2 - 1.2 mg/dL   Alkaline Phosphatase 69 39 - 117 U/L   AST 26 0 - 37 U/L   ALT 22 0 - 35 U/L   Total Protein 7.7 6.0 - 8.3 g/dL   Albumin 4.3 3.5 - 5.2 g/dL   Calcium 9.8 8.4 - 34.2 mg/dL  TSH  Result Value Ref Range   TSH 1.733 0.350 - 4.500 uIU/mL  POCT CBC  Result Value Ref Range   WBC 7.1 4.6 - 10.2 K/uL   Lymph, poc 2.7 0.6 - 3.4   POC LYMPH PERCENT 37.4 10 - 50 %L   MID (cbc) 0.7 0 - 0.9   POC MID % 9.2 0 - 12 %M   POC Granulocyte 3.8 2 - 6.9   Granulocyte percent 53.4 37 - 80 %G   RBC 4.54 4.04 - 5.48 M/uL   Hemoglobin 12.7 12.2 - 16.2 g/dL   HCT, POC 87.6 81.1 - 47.9 %   MCV 85.9 80 - 97 fL   MCH, POC 28.0 27 - 31.2 pg   MCHC 32.6 31.8 - 35.4 g/dL   RDW, POC 57.2 %   Platelet Count, POC 298 142 - 424 K/uL   MPV 6.3 0 - 99.8 fL  POCT UA - Microscopic Only  Result Value Ref Range   WBC, Ur, HPF, POC NEG    RBC, urine, microscopic NEG    Bacteria, U Microscopic NEG    Mucus, UA NEG    Epithelial cells, urine per micros 0-1    Crystals, Ur, HPF, POC  NEG    Casts, Ur, LPF, POC NEG    Yeast, UA NEG   POCT urinalysis dipstick  Result Value Ref Range   Color, UA YELLOW    Clarity, UA CLEAR    Glucose, UA NEG    Bilirubin, UA NEG    Ketones, UA NEG    Spec Grav, UA 1.010    Blood, UA NEG    pH, UA 7.0    Protein, UA NEG    Urobilinogen, UA 0.2    Nitrite, UA NEG    Leukocytes, UA Negative Negative  POCT glycosylated hemoglobin (Hb A1C)  Result Value Ref Range   Hemoglobin A1C 5.8

## 2014-11-09 LAB — COMPREHENSIVE METABOLIC PANEL
ALT: 22 U/L (ref 0–35)
AST: 26 U/L (ref 0–37)
Albumin: 4.3 g/dL (ref 3.5–5.2)
Alkaline Phosphatase: 69 U/L (ref 39–117)
BILIRUBIN TOTAL: 0.4 mg/dL (ref 0.2–1.2)
BUN: 16 mg/dL (ref 6–23)
CHLORIDE: 98 meq/L (ref 96–112)
CO2: 30 mEq/L (ref 19–32)
CREATININE: 1.55 mg/dL — AB (ref 0.50–1.10)
Calcium: 9.8 mg/dL (ref 8.4–10.5)
Glucose, Bld: 98 mg/dL (ref 70–99)
Potassium: 4.2 mEq/L (ref 3.5–5.3)
SODIUM: 140 meq/L (ref 135–145)
Total Protein: 7.7 g/dL (ref 6.0–8.3)

## 2014-11-09 LAB — LIPID PANEL
CHOLESTEROL: 258 mg/dL — AB (ref 0–200)
HDL: 39 mg/dL — ABNORMAL LOW (ref >=46)
TRIGLYCERIDES: 460 mg/dL — AB (ref <150)
Total CHOL/HDL Ratio: 6.6 Ratio

## 2014-11-09 LAB — TSH: TSH: 1.733 u[IU]/mL (ref 0.350–4.500)

## 2014-11-19 ENCOUNTER — Other Ambulatory Visit: Payer: Self-pay | Admitting: Family Medicine

## 2014-11-21 ENCOUNTER — Other Ambulatory Visit: Payer: Self-pay | Admitting: Family Medicine

## 2014-11-22 ENCOUNTER — Ambulatory Visit: Payer: Self-pay | Admitting: Nurse Practitioner

## 2014-11-22 LAB — HEPATIC FUNCTION PANEL
ALK PHOS: 65 U/L (ref 25–125)
AST: 28 U/L (ref 13–35)

## 2014-11-22 LAB — BASIC METABOLIC PANEL
CREATININE: 1.7 mg/dL — AB (ref <=1.1)
Potassium: 3.8 mmol/L (ref 3.4–5.3)
Sodium: 139 mmol/L (ref 137–147)

## 2014-11-28 ENCOUNTER — Encounter: Payer: Self-pay | Admitting: *Deleted

## 2014-12-01 ENCOUNTER — Inpatient Hospital Stay (HOSPITAL_COMMUNITY)
Admission: AD | Admit: 2014-12-01 | Discharge: 2014-12-05 | DRG: 885 | Disposition: A | Discharge status: Home / Self Care | Payer: Managed Care, Other (non HMO) | Source: Intra-hospital | Attending: Psychiatry | Admitting: Psychiatry

## 2014-12-01 ENCOUNTER — Encounter (HOSPITAL_COMMUNITY): Payer: Self-pay | Admitting: *Deleted

## 2014-12-01 ENCOUNTER — Emergency Department (HOSPITAL_COMMUNITY): Payer: Managed Care, Other (non HMO)

## 2014-12-01 ENCOUNTER — Ambulatory Visit (INDEPENDENT_AMBULATORY_CARE_PROVIDER_SITE_OTHER): Payer: Managed Care, Other (non HMO) | Admitting: Family Medicine

## 2014-12-01 ENCOUNTER — Emergency Department (HOSPITAL_COMMUNITY)
Admission: EM | Admit: 2014-12-01 | Discharge: 2014-12-01 | Disposition: A | Discharge status: Other Acute Inpatient Hospital | Payer: Managed Care, Other (non HMO) | Attending: Emergency Medicine | Admitting: Emergency Medicine

## 2014-12-01 ENCOUNTER — Encounter (HOSPITAL_COMMUNITY): Payer: Self-pay

## 2014-12-01 VITALS — BP 110/70 | HR 71 | Temp 97.8°F | Ht 62.25 in | Wt 205.0 lb

## 2014-12-01 DIAGNOSIS — F39 Unspecified mood [affective] disorder: Secondary | ICD-10-CM | POA: Diagnosis present

## 2014-12-01 DIAGNOSIS — F329 Major depressive disorder, single episode, unspecified: Secondary | ICD-10-CM

## 2014-12-01 DIAGNOSIS — G8929 Other chronic pain: Secondary | ICD-10-CM | POA: Diagnosis present

## 2014-12-01 DIAGNOSIS — Z7951 Long term (current) use of inhaled steroids: Secondary | ICD-10-CM | POA: Insufficient documentation

## 2014-12-01 DIAGNOSIS — F339 Major depressive disorder, recurrent, unspecified: Secondary | ICD-10-CM | POA: Diagnosis not present

## 2014-12-01 DIAGNOSIS — Z79899 Other long term (current) drug therapy: Secondary | ICD-10-CM | POA: Insufficient documentation

## 2014-12-01 DIAGNOSIS — R45851 Suicidal ideations: Secondary | ICD-10-CM | POA: Diagnosis present

## 2014-12-01 DIAGNOSIS — F419 Anxiety disorder, unspecified: Principal | ICD-10-CM

## 2014-12-01 DIAGNOSIS — E079 Disorder of thyroid, unspecified: Secondary | ICD-10-CM | POA: Insufficient documentation

## 2014-12-01 DIAGNOSIS — R296 Repeated falls: Secondary | ICD-10-CM | POA: Diagnosis present

## 2014-12-01 DIAGNOSIS — G43909 Migraine, unspecified, not intractable, without status migrainosus: Secondary | ICD-10-CM

## 2014-12-01 DIAGNOSIS — M549 Dorsalgia, unspecified: Secondary | ICD-10-CM | POA: Insufficient documentation

## 2014-12-01 DIAGNOSIS — I10 Essential (primary) hypertension: Secondary | ICD-10-CM | POA: Diagnosis not present

## 2014-12-01 DIAGNOSIS — Z049 Encounter for examination and observation for unspecified reason: Secondary | ICD-10-CM

## 2014-12-01 DIAGNOSIS — F418 Other specified anxiety disorders: Secondary | ICD-10-CM

## 2014-12-01 DIAGNOSIS — F333 Major depressive disorder, recurrent, severe with psychotic symptoms: Secondary | ICD-10-CM | POA: Diagnosis not present

## 2014-12-01 DIAGNOSIS — G4730 Sleep apnea, unspecified: Secondary | ICD-10-CM | POA: Diagnosis present

## 2014-12-01 DIAGNOSIS — N183 Chronic kidney disease, stage 3 (moderate): Secondary | ICD-10-CM | POA: Diagnosis not present

## 2014-12-01 DIAGNOSIS — K219 Gastro-esophageal reflux disease without esophagitis: Secondary | ICD-10-CM | POA: Diagnosis not present

## 2014-12-01 DIAGNOSIS — N179 Acute kidney failure, unspecified: Secondary | ICD-10-CM | POA: Diagnosis not present

## 2014-12-01 HISTORY — DX: Other chronic pain: G89.29

## 2014-12-01 HISTORY — DX: Dorsalgia, unspecified: M54.9

## 2014-12-01 LAB — COMPREHENSIVE METABOLIC PANEL
ALT: 24 U/L (ref 14–54)
AST: 30 U/L (ref 15–41)
Albumin: 4.5 g/dL (ref 3.5–5.0)
Alkaline Phosphatase: 54 U/L (ref 38–126)
Anion gap: 8 (ref 5–15)
BUN: 15 mg/dL (ref 6–20)
CALCIUM: 10.1 mg/dL (ref 8.9–10.3)
CO2: 27 mmol/L (ref 22–32)
CREATININE: 1.83 mg/dL — AB (ref 0.44–1.00)
Chloride: 104 mmol/L (ref 101–111)
GFR, EST AFRICAN AMERICAN: 33 mL/min — AB (ref >60)
GFR, EST NON AFRICAN AMERICAN: 29 mL/min — AB (ref >60)
GLUCOSE: 97 mg/dL (ref 65–99)
Potassium: 3.5 mmol/L (ref 3.5–5.1)
Sodium: 139 mmol/L (ref 135–145)
Total Bilirubin: 0.6 mg/dL (ref 0.3–1.2)
Total Protein: 8.3 g/dL — ABNORMAL HIGH (ref 6.5–8.1)

## 2014-12-01 LAB — CBC
HCT: 41.4 % (ref 36.0–46.0)
HEMOGLOBIN: 13 g/dL (ref 12.0–15.0)
MCH: 28.3 pg (ref 26.0–34.0)
MCHC: 31.4 g/dL (ref 30.0–36.0)
MCV: 90 fL (ref 78.0–100.0)
Platelets: 238 10*3/uL (ref 150–400)
RBC: 4.6 MIL/uL (ref 3.87–5.11)
RDW: 14.6 % (ref 11.5–15.5)
WBC: 6.1 10*3/uL (ref 4.0–10.5)

## 2014-12-01 LAB — ETHANOL

## 2014-12-01 LAB — RAPID URINE DRUG SCREEN, HOSP PERFORMED
AMPHETAMINES: NOT DETECTED
BARBITURATES: NOT DETECTED
BENZODIAZEPINES: NOT DETECTED
COCAINE: NOT DETECTED
Opiates: NOT DETECTED
TETRAHYDROCANNABINOL: NOT DETECTED

## 2014-12-01 LAB — ACETAMINOPHEN LEVEL

## 2014-12-01 LAB — SALICYLATE LEVEL: Salicylate Lvl: <4 mg/dL (ref 2.8–30.0)

## 2014-12-01 MED ORDER — ACETAMINOPHEN 325 MG PO TABS
650.0000 mg | ORAL_TABLET | ORAL | Status: DC | PRN
  Administered 2014-12-01: 650 mg via ORAL
  Filled 2014-12-01: qty 2

## 2014-12-01 MED ORDER — MAGNESIUM HYDROXIDE 400 MG/5ML PO SUSP: 30.0000 mL | Freq: Every day | ORAL | Status: DC | PRN

## 2014-12-01 MED ORDER — GABAPENTIN 400 MG PO CAPS
400.0000 mg | ORAL_CAPSULE | Freq: Three times a day (TID) | ORAL | Status: DC
  Administered 2014-12-02: 400 mg via ORAL
  Filled 2014-12-01 (×5): qty 1

## 2014-12-01 MED ORDER — AMITRIPTYLINE HCL 50 MG PO TABS
50.0000 mg | ORAL_TABLET | Freq: Every day | ORAL | Status: DC
  Administered 2014-12-01: 50 mg via ORAL
  Filled 2014-12-01: qty 2
  Filled 2014-12-01 (×2): qty 1

## 2014-12-01 MED ORDER — LEVOTHYROXINE SODIUM 50 MCG PO TABS
50.0000 ug | ORAL_TABLET | Freq: Every day | ORAL | Status: DC
  Administered 2014-12-02 – 2014-12-05 (×4): 50 ug via ORAL
  Filled 2014-12-01: qty 1
  Filled 2014-12-01: qty 2
  Filled 2014-12-01 (×4): qty 1

## 2014-12-01 MED ORDER — LORAZEPAM 1 MG PO TABS: 1.0000 mg | ORAL_TABLET | Freq: Three times a day (TID) | ORAL | Status: DC | PRN

## 2014-12-01 MED ORDER — POTASSIUM CHLORIDE CRYS ER 20 MEQ PO TBCR
20.0000 meq | EXTENDED_RELEASE_TABLET | Freq: Two times a day (BID) | ORAL | Status: DC
  Administered 2014-12-01 – 2014-12-05 (×8): 20 meq via ORAL
  Filled 2014-12-01 (×12): qty 1

## 2014-12-01 MED ORDER — HYDROXYZINE HCL 25 MG PO TABS
25.0000 mg | ORAL_TABLET | Freq: Three times a day (TID) | ORAL | Status: DC | PRN
  Administered 2014-12-02 – 2014-12-04 (×3): 25 mg via ORAL
  Filled 2014-12-01 (×3): qty 1
  Filled 2014-12-01: qty 10

## 2014-12-01 MED ORDER — VITAMIN D3 25 MCG (1000 UNIT) PO TABS
1000.0000 [IU] | ORAL_TABLET | Freq: Every day | ORAL | Status: DC
  Administered 2014-12-02 – 2014-12-05 (×4): 1000 [IU] via ORAL
  Filled 2014-12-01 (×4): qty 1

## 2014-12-01 MED ORDER — SUMATRIPTAN SUCCINATE 50 MG PO TABS
50.0000 mg | ORAL_TABLET | ORAL | Status: DC | PRN
  Administered 2014-12-02: 50 mg via ORAL
  Filled 2014-12-01: qty 1

## 2014-12-01 MED ORDER — PNEUMOCOCCAL VAC POLYVALENT 25 MCG/0.5ML IJ INJ
0.5000 mL | INJECTION | INTRAMUSCULAR | Status: AC
  Administered 2014-12-02: 0.5 mL via INTRAMUSCULAR

## 2014-12-01 MED ORDER — ACETAMINOPHEN 325 MG PO TABS
650.0000 mg | ORAL_TABLET | Freq: Four times a day (QID) | ORAL | Status: DC | PRN
  Administered 2014-12-04: 650 mg via ORAL
  Filled 2014-12-01: qty 2

## 2014-12-01 MED ORDER — OXYCODONE-ACETAMINOPHEN 7.5-325 MG PO TABS: 1.0000 | ORAL_TABLET | Freq: Four times a day (QID) | ORAL | Status: DC | PRN

## 2014-12-01 MED ORDER — ZOSTER VACCINE LIVE 19400 UNT/0.65ML ~~LOC~~ SOLR: 0.6500 mL | Freq: Once | SUBCUTANEOUS | Status: DC

## 2014-12-01 MED ORDER — FUROSEMIDE 40 MG PO TABS
40.0000 mg | ORAL_TABLET | Freq: Every day | ORAL | Status: DC
  Administered 2014-12-02 – 2014-12-05 (×4): 40 mg via ORAL
  Filled 2014-12-01 (×6): qty 1

## 2014-12-01 MED ORDER — BUPROPION HCL ER (XL) 150 MG PO TB24
150.0000 mg | ORAL_TABLET | Freq: Every evening | ORAL | Status: DC
  Administered 2014-12-01: 150 mg via ORAL
  Filled 2014-12-01 (×3): qty 1

## 2014-12-01 MED ORDER — ONDANSETRON HCL 4 MG PO TABS: 4.0000 mg | ORAL_TABLET | Freq: Three times a day (TID) | ORAL | Status: DC | PRN

## 2014-12-01 MED ORDER — ALUM & MAG HYDROXIDE-SIMETH 200-200-20 MG/5ML PO SUSP: 30.0000 mL | ORAL | Status: DC | PRN

## 2014-12-01 MED ORDER — CITALOPRAM HYDROBROMIDE 40 MG PO TABS
40.0000 mg | ORAL_TABLET | Freq: Every day | ORAL | Status: DC
  Administered 2014-12-02: 40 mg via ORAL
  Filled 2014-12-01: qty 2
  Filled 2014-12-01 (×2): qty 1

## 2014-12-01 MED ORDER — OXYCODONE HCL 5 MG PO TABS
5.0000 mg | ORAL_TABLET | Freq: Four times a day (QID) | ORAL | Status: DC | PRN
  Administered 2014-12-01 – 2014-12-03 (×2): 5 mg via ORAL
  Filled 2014-12-01 (×2): qty 1

## 2014-12-01 NOTE — ED Notes (Signed)
She states she has had some chronic (back and sciatic) pain x 5 years; also has had hx of depression. She states she saw Dr. Clelia CroftShaw this morning, and he recommended she come here.  She admits s.i. Without plan.

## 2014-12-01 NOTE — Progress Notes (Signed)
59 year old female pt admitted on voluntary basis. Pt reports that she has been feeling depressed and did endorse some SI the other day, however during admission pt reports that she does not feel that way currently and is able to contract for safety on the unit. Pt does endorse chronic pain issues and also a recent medication change for her depression. Pt reports that she lives with her husband and reports that she will go back there after discharge. Pt does report that she falls easily and does use a walker for ambulation. Pt does report that she uses a CPAP machine and reports that she will have husband bring it tomorrow. Pt was oriented to the unit and safety maintained.
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
## 2014-12-01 NOTE — ED Notes (Signed)
PTA, this Clinical research associatewriter spoke with Dr. Clelia CroftShaw and was advised that pt was en route in [place] with spouse c/o  SI with a plan and wants her to be evaluated by the psychiatric team. Triage aware of pt arrival
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
## 2014-12-01 NOTE — Tx Team (Signed)
Initial Interdisciplinary Treatment Plan   PATIENT STRESSORS: Health problems Medication change or noncompliance   PATIENT STRENGTHS: Ability for insight Average or above average intelligence Capable of independent living General fund of knowledge Motivation for treatment/growth Supportive family/friends   PROBLEM LIST: Problem List/Patient Goals Date to be addressed Date deferred Reason deferred Estimated date of resolution  "I want to be able to work on being able to get some rest and sleep at night" 12/01/14     Depression 12/01/14     Suicidal Ideation 12/01/14                                          DISCHARGE CRITERIA:  Ability to meet basic life and health needs Improved stabilization in mood, thinking, and/or behavior Verbal commitment to aftercare and medication compliance  PRELIMINARY DISCHARGE PLAN: Attend aftercare/continuing care group Return to previous living arrangement  PATIENT/FAMIILY INVOLVEMENT: This treatment plan has been presented to and reviewed with the patient, Jennifer Davidson, and/or family member, .  The patient and family have been given the opportunity to ask questions and make suggestions.  Jennifer Davidson, Jennifer Davidson 12/01/2014, 9:17 PM

## 2014-12-01 NOTE — ED Provider Notes (Signed)
CSN: [id number]     Arrival date & time 12/01/14  1039 History   First MD Initiated Contact with Patient 12/01/14 1122     Chief Complaint  Patient presents with  . Suicidal     (Consider location/radiation/quality/duration/timing/severity/associated sxs/prior Treatment) HPI Comments: Patient with a history of severe, chronic back pain, HTN, thyroid disease, depression presents with suicidal ideation. She feels that her pain is making her depression worse. She has had SI in the past with plan to overdose. She does not have a plan currently. No HI, hallucinations. No substance abuse issues.  The history is provided by the patient. No language interpreter was used.    Past Medical History  Diagnosis Date  . Hypertension   . Cholesterol serum increased   . Thyroid disease   . Sleep apnea   . Migraine   . Depression   . Neuromuscular disorder   . GERD (gastroesophageal reflux disease)   . Anxiety   . Chronic back pain    Past Surgical History  Procedure Laterality Date  . Abdominal hysterectomy    . Back surgery    . Appendectomy     Family History  Problem Relation Age of Onset  . Diabetes Mother   . Heart disease Mother   . Kidney disease Mother   . Thyroid disease Mother   . Heart disease Father   . Seizures Father   . COPD Father   . Cancer Maternal Grandmother    History  Substance Use Topics  . Smoking status: Never Smoker   . Smokeless tobacco: Never Used  . Alcohol Use: No   OB History    No data available     Review of Systems  Constitutional: Negative for fever and chills.  HENT: Negative.   Respiratory: Negative.   Cardiovascular: Negative.   Gastrointestinal: Negative.   Musculoskeletal: Positive for back pain.  Skin: Negative.   Neurological: Negative.   Psychiatric/Behavioral: Positive for suicidal ideas and dysphoric mood.      Allergies  Review of patient's allergies indicates no known allergies.  Home Medications   Prior to  Admission medications   Medication Sig Start Date End Date Taking? Authorizing Provider  buPROPion (WELLBUTRIN XL) 150 MG 24 hr tablet Take 1 tablet (150 mg total) by mouth daily. 11/08/14   Sherren MochaEva N Shaw, MD  cetirizine (ZYRTEC) 10 MG tablet Take 1 tablet (10 mg total) by mouth daily. 05/03/14   Sherren MochaEva N Shaw, MD  citalopram (CELEXA) 10 MG tablet Take 2 tablets (20 mg total) by mouth daily. And taper off as directed by physician 11/08/14   Sherren MochaEva N Shaw, MD  clonazePAM (KLONOPIN) 0.5 MG tablet TAKE 1 TABLET BY MOUTH TWICE A DAY AS NEEDED FOR ANXIETY CAN TAKE 2 TABLETS AT BEDTIME FOR INSOMNIA 11/08/14   Sherren MochaEva N Shaw, MD  esomeprazole (NEXIUM) 40 MG capsule Take 1 capsule (40 mg total) by mouth daily. 11/08/14   Sherren MochaEva N Shaw, MD  fluticasone (FLONASE) 50 MCG/ACT nasal spray Place 2 sprays into both nostrils at bedtime. 05/03/14   Sherren MochaEva N Shaw, MD  furosemide (LASIX) 40 MG tablet Take 1 tablet (40 mg total) by mouth daily. 11/20/14   Sherren MochaEva N Shaw, MD  gabapentin (NEURONTIN) 300 MG capsule Take 400 mg by mouth 3 (three) times daily.     Historical Provider, MD  levothyroxine (SYNTHROID, LEVOTHROID) 50 MCG tablet TAKE 1 TABLET (50 MCG TOTAL) BY MOUTH DAILY. 05/13/14   Sherren MochaEva N Shaw, MD  potassium chloride SA (  KLOR-CON M20) 20 MEQ tablet Take 1 tablet (20 mEq total) by mouth 2 (two) times daily. 05/03/14   [name], MD  ranitidine (ZANTAC) 150 MG tablet Take 1 tablet (150 mg total) by mouth 3 times/day as needed-between meals & bedtime for heartburn. 05/03/14   [name], MD  SUMAtriptan (IMITREX) 50 MG tablet Take 1 tablet (50 mg total) by mouth every 2 (two) hours as needed for migraine. 05/03/14   [name], MD  topiramate (TOPAMAX) 100 MG tablet Take 1 tablet (100 mg total) by mouth at bedtime. 11/08/14   [name], MD  traZODone (DESYREL) 50 MG tablet Take 50 mg by mouth at bedtime.    Historical Provider, MD  zoster vaccine live, PF, (ZOSTAVAX) 16109 UNT/0.65ML injection Inject 19,400 Units into the skin once. 06/21/14    [name], MD   BP 125/68 mmHg  Pulse 74  Temp(Src) 97.8 F (36.6 C) (Oral)  Resp 16  SpO2 96% Physical Exam  Constitutional: She is oriented to person, place, and time. She appears well-developed and well-nourished.  Neck: Normal range of motion.  Pulmonary/Chest: Effort normal.  Neurological: She is alert and oriented to person, place, and time.  Skin: Skin is warm and dry.  Psychiatric: Her speech is normal. Judgment normal. Cognition and memory are normal. She exhibits a depressed mood. She expresses suicidal ideation. She expresses no homicidal ideation.    ED Course  Procedures (including critical care time) Labs Review Labs Reviewed  COMPREHENSIVE METABOLIC PANEL  ETHANOL  SALICYLATE LEVEL  ACETAMINOPHEN LEVEL  CBC  URINE RAPID DRUG SCREEN, HOSP PERFORMED    Imaging Review No results found.   EKG Interpretation None      MDM   Final diagnoses:  None    1. Suicidal ideation 2. Depression  Here with husband who is supportive. TTS consultation to determine disposition.    Elpidio Anis, [name] 12/01/14 1157  Lorre Nick, MD 12/03/14 [phone]

## 2014-12-01 NOTE — Progress Notes (Signed)
[facility]erv[facility]Eric[facility]  [facility]1.  Patient can transfer after shift change.     Maryelizabeth Rowanressa Rachel Rison, MSW, LCSW, LCAS Clinical Social Worker [phone]

## 2014-12-01 NOTE — BH Assessment (Signed)
Assessment Note  Jennifer Davidson is an 59 y.o. female. Patient was brought into the ED by husband from her doctor's appointment with Dr. [name] because of suicidal ideations with plan.  Patient continues to endorse suicidal ideations with plan to shoot self.  Patient reported to her PCP today of a plan to overdose with personal medications.  Patient reports 4 months ago spreading out on a table her most lethal medications in quantity contemplating suicide but did not take the medications.  Patient reports being management by Dr. Manon Hilding with [facility] for chronic back pain for the past 5 years.  Patient reports her pain has taken away her ability to function daily. She reports an inability to leave the house and unable to sit outside on her deck because a heat stroke last year.  Patient reports guilt because of her needing help from her husband during the day.    CSW spoke with Husband and Daughter who reports many years ago the patient having a side effect from prescribed trazodone.  The family explained in the past the patient experiencing some bizzare behaviors while on trazodone.    CSW consulted with Dr. Jannifer Franklin if was recommended to refer for inpatient treatment for stabilization and safety.  CSW spoke [place], [place] about recommendations.    Axis I: Mood Disorder NOS Axis II: Deferred Axis III:  Past Medical History  Diagnosis Date  . Hypertension   . Cholesterol serum increased   . Thyroid disease   . Sleep apnea   . Migraine   . Depression   . Neuromuscular disorder   . GERD (gastroesophageal reflux disease)   . Anxiety   . Chronic back pain    Axis IV: other psychosocial or environmental problems, problems related to social environment, problems with access to health care services and Chronic Pain Axis V: 41-50 serious symptoms  Past Medical History:  Past Medical History  Diagnosis Date  . Hypertension   . Cholesterol serum increased   . Thyroid disease   . Sleep apnea    . Migraine   . Depression   . Neuromuscular disorder   . GERD (gastroesophageal reflux disease)   . Anxiety   . Chronic back pain     Past Surgical History  Procedure Laterality Date  . Abdominal hysterectomy    . Back surgery    . Appendectomy      Family History:  Family History  Problem Relation Age of Onset  . Diabetes Mother   . Heart disease Mother   . Kidney disease Mother   . Thyroid disease Mother   . Heart disease Father   . Seizures Father   . COPD Father   . Cancer Maternal Grandmother     Social History:  reports that she has never smoked. She has never used smokeless tobacco. She reports that she does not drink alcohol or use illicit drugs.  Additional Social History:  Alcohol / Drug Use Pain Medications: See MARS Prescriptions: See MARs Over the Counter: See MARs History of alcohol / drug use?: No history of alcohol / drug abuse Longest period of sobriety (when/how long): No Drug History  CIWA: CIWA-Ar BP: 125/68 mmHg Pulse Rate: 74 COWS:    Allergies: No Known Allergies  Home Medications:  (Not in a hospital admission)  OB/GYN Status:  No LMP recorded. Patient has had a hysterectomy.  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 : Yes Suicidal Intent: Yes-Currently Present Has patient had any suicidal intent within the past 6 months prior to admission? : Yes Is patient at risk for suicide?: Yes Suicidal Plan?: Yes-Currently Present Has patient had any suicidal plan within the past 6 months prior to admission? : Yes Specify Current Suicidal Plan: overdose, gun at bedside Access to Means: Yes Specify Access to Suicidal Means: personal medications and gun at bedside What has been your use of drugs/alcohol within the last 12 months?: non Previous Attempts/Gestures: No Intentional Self Injurious Behavior: None Family Suicide History: No Recent stressful life event(s): Loss (Comment), Recent negative physical changes, Other (Comment) (Chronic Pain) Persecutory voices/beliefs?: No Depression: Yes Depression Symptoms: Tearfulness, Isolating, Fatigue, Guilt, Loss of interest in usual pleasures, Feeling angry/irritable (hopelessness) Substance abuse history and/or treatment for substance abuse?: No  Risk to Others within the past 6 months Homicidal Ideation: No-Not Currently/Within Last 6 Months Does patient have any lifetime risk of violence toward others beyond the six months prior to admission? : No Thoughts of Harm to Others: No-Not Currently Present/Within Last 6 Months Current Homicidal Intent: No-Not Currently/Within Last 6 Months Current Homicidal Plan: No-Not Currently/Within Last 6 Months Access to Homicidal Means: No History of harm to others?: No Assessment of Violence: None Noted Does patient have access to weapons?: Yes (Comment) Criminal Charges Pending?: No Does patient have a court date: No Is patient on probation?: No  Psychosis Hallucinations: None noted Delusions: None noted  Mental Status  Report Appearance/Hygiene: In hospital gown Eye Contact: Fair Motor Activity: Freedom of movement Speech: Logical/coherent Level of Consciousness: Alert Mood: Depressed Affect: Depressed Anxiety Level: Minimal Thought Processes: Coherent Judgement: Impaired Orientation: Person, Place, Time Obsessive Compulsive Thoughts/Behaviors: None  Cognitive Functioning Concentration: Fair Memory: Recent Intact, Remote Intact IQ: Average Insight: Fair Impulse Control: Fair Appetite: Fair Sleep: Decreased Total Hours of Sleep: 4 Vegetative Symptoms: None  ADLScreening [facility] Assessment Services) Patient's cognitive ability adequate to safely complete daily activities?: Yes Patient able to express need for assistance with ADLs?: Yes Independently performs ADLs?: Yes (appropriate for developmental age)  Prior Inpatient Therapy Prior Inpatient Therapy: No  Prior Outpatient Therapy Prior Outpatient Therapy: No Does patient have an ACCT team?: No Does patient have Intensive In-House Services?  : No Does patient have [facility] services? : No Does patient have P4CC services?: No  ADL Screening (condition at time of admission) Patient's cognitive ability adequate to safely complete daily activities?: Yes Patient able to express need for assistance with ADLs?: Yes Independently performs ADLs?: Yes (appropriate for developmental age)       Abuse/Neglect Assessment (Assessment to be complete while patient is alone) Physical Abuse: Denies Verbal Abuse: Denies Sexual Abuse: Denies Exploitation of patient/patient's resources: Denies Self-Neglect: Denies Values / Beliefs Cultural Requests During Hospitalization: None Spiritual Requests During Hospitalization: None Consults Spiritual  Care Consult Needed: No Social Work Consult Needed: No Merchant navy officer (For Healthcare) Does patient have an advance directive?: No Would patient like information on creating an advanced directive?: No -  patient declined information    Additional Information 1:1 In Past 12 Months?: No CIRT Risk: No Elopement Risk: No Does patient have medical clearance?: Yes     Disposition:  Disposition Initial Assessment Completed for this Encounter: Yes Disposition of Patient: Inpatient treatment program Type of inpatient treatment program: Adult  On Site Evaluation by:   Reviewed with Physician:    Maryelizabeth Rowan A 12/01/2014 12:54 PM

## 2014-12-02 ENCOUNTER — Encounter (HOSPITAL_COMMUNITY): Payer: Self-pay | Admitting: Psychiatry

## 2014-12-02 DIAGNOSIS — N179 Acute kidney failure, unspecified: Secondary | ICD-10-CM

## 2014-12-02 DIAGNOSIS — N183 Chronic kidney disease, stage 3 (moderate): Secondary | ICD-10-CM

## 2014-12-02 DIAGNOSIS — F339 Major depressive disorder, recurrent, unspecified: Secondary | ICD-10-CM | POA: Diagnosis present

## 2014-12-02 DIAGNOSIS — F333 Major depressive disorder, recurrent, severe with psychotic symptoms: Secondary | ICD-10-CM

## 2014-12-02 MED ORDER — GABAPENTIN 300 MG PO CAPS: 600.0000 mg | ORAL_CAPSULE | Freq: Three times a day (TID) | ORAL | Status: DC

## 2014-12-02 MED ORDER — GABAPENTIN 300 MG PO CAPS
600.0000 mg | ORAL_CAPSULE | Freq: Two times a day (BID) | ORAL | Status: DC
  Administered 2014-12-02 – 2014-12-05 (×6): 600 mg via ORAL
  Filled 2014-12-02 (×8): qty 2

## 2014-12-02 MED ORDER — DULOXETINE HCL 30 MG PO CPEP
30.0000 mg | ORAL_CAPSULE | Freq: Every day | ORAL | Status: DC
  Administered 2014-12-03: 30 mg via ORAL
  Filled 2014-12-02 (×3): qty 1

## 2014-12-02 MED ORDER — LIDOCAINE 5 % EX PTCH
1.0000 | MEDICATED_PATCH | Freq: Every day | CUTANEOUS | Status: DC
  Administered 2014-12-02 – 2014-12-05 (×4): 1 via TRANSDERMAL
  Filled 2014-12-02 (×7): qty 1

## 2014-12-02 NOTE — Progress Notes (Signed)
[facility] Group Notes:  (Nursing/MHT/Case Management/Adjunct)  Date:  12/02/2014  Time:  9:15 PM  Type of Therapy:  Psychoeducational Skills  Participation Level:  Active  Participation Quality:  Appropriate and Attentive  Affect:  Appropriate and Excited  Cognitive:  Appropriate  Insight:  Appropriate and Good  Engagement in Group:  Engaged  Modes of Intervention:  Activity  Summary of Progress/Problems: Pts played a therapeutic activity of [employer]. Pt was engaged and actively participated.  Pepper Kerrick C 12/02/2014, 9:15 PM 

## 2014-12-02 NOTE — Progress Notes (Signed)
Recreation Therapy Notes  Date: 07.18.16 Time: 9:30 am Location: [address] Group Room  Group Topic: Stress Management  Goal Area(s) Addresses:  Patient will verbalize importance of using healthy stress management.  Patient will identify positive emotions associated with healthy stress management.   Intervention: Stress Management  Activity :  Guided Imagery Script.  LRT introduced and educated patients on the stress management technique of guided imagery.  A script was used to deliver the technique to patients.  Patients were asked to follow the script read a loud by LRT to engage in practicing the stress management technique.  Education:  Stress Management, Discharge Planning.   Education Outcome: Acknowledges edcuation/In group clarification offered/Needs additional education  Clinical Observations/Feedback: Patient did not attend group.   Adebayo Ensminger, LRT/CTRS         Kataleah Bejar A 12/02/2014 2:08 PM 

## 2014-12-02 NOTE — [facility] Suicide Risk Assessment (Signed)
[facility] INPATIENT:  Family/Significant Other Suicide Prevention Education  Suicide Prevention Education:  Education Completed; Jennifer Davidson [phone]),  (name of family member/significant other) has been identified by the patient as the family member/significant other with whom the patient will be residing, and identified as the person(s) who will aid the patient in the event of a mental health crisis (suicidal ideations/suicide attempt).  With written consent from the patient, the family member/significant other has been provided the following suicide prevention education, prior to the and/or following the discharge of the patient.  The suicide prevention education provided includes the following:  Suicide risk factors  Suicide prevention and interventions  National Suicide Hotline telephone number  [facility] assessment telephone number  [facility] Emergency Assistance 911  [facility] and/or Residential Mobile Crisis Unit telephone number  Request made of family/significant other to:  Remove weapons (e.g., guns, rifles, knives), all items previously/currently identified as safety concern.    Remove drugs/medications (over-the-counter, prescriptions, illicit drugs), all items previously/currently identified as a safety concern.  The family member/significant other verbalizes understanding of the suicide prevention education information provided.  The family member/significant other agrees to remove the items of safety concern listed above.  Jennifer Davidson, Jennifer Davidson 12/02/2014, 3:41 PM
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
## 2014-12-02 NOTE — Progress Notes (Signed)
D: Patient in the dayroom sitting quietly on approach.  Patient states she had a good day.  Patient states she spent time talking to different doctors today.  Patient states she is depressed but states it is better that when she first came in.  Patient states she had a good visit with her daughter and she states she spoke with her husband on the phone.  Patient states she feels the medications she was on prior to coming here were not working [employer] Pa[employer] denies SI/HI and denies AVH. A: Staff to monitor Q 15 mins for safety.  Encouragement and support offered.  Scheduled medications administered per orders. Imitrex administered prn for a headache and vistaril administered prn for sleep. R: Patient remains safe on the unit.  Patient attended group tonight.  Patient visible on the unit and interacting with peers.  Patient taking administered medications.

## 2014-12-02 NOTE — Progress Notes (Signed)
Nutrition Brief Note  Patient identified on the Malnutrition Screening Tool (MST) Report  Wt Readings from Last 15 Encounters:  12/01/14 200 lb (90.719 kg)  12/01/14 205 lb (92.987 kg)  11/08/14 209 lb 9.6 oz (95.074 kg)  06/21/14 211 lb (95.709 kg)  05/03/14 204 lb 6.4 oz (92.715 kg)  04/17/14 209 lb (94.802 kg)  10/26/13 206 lb (93.441 kg)  06/11/13 208 lb 9.6 oz (94.62 kg)  05/11/13 198 lb (89.812 kg)  05/01/13 195 lb (88.451 kg)  04/22/13 206 lb (93.441 kg)  03/09/13 190 lb (86.183 kg)  02/16/13 190 lb 6.4 oz (86.365 kg)  02/09/13 181 lb (82.101 kg)  02/05/13 185 lb (83.915 kg)    Body mass index is 36.57 kg/(m^2). Patient meets criteria for morbid obesity based on current BMI.   Diet Order: Diet regular Room service appropriate?: Yes; Fluid consistency:: Thin Pt is also offered choice of unit snacks mid-morning and mid-afternoon.   Labs and medications reviewed.   No nutrition interventions warranted at this time. If nutrition issues arise, please consult RD.   Tilda FrancoLindsey Takyah Ciaramitaro, MS, RD, LDN Pager: [phone] After Hours Pager: [phone]
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
## 2014-12-02 NOTE — [facility] Suicide Risk Assessment (Signed)
[facility]   Nursing information obtained from:    Demographic factors:   59 year old married female on disability Current Mental Status:    see below Loss Factors:   chronic pain Historical Factors:   depression, chronic pain Risk Reduction Factors:   sense of responsibility to her family Total Time spent with patient: 45 minutes Principal Problem: Major depression, recurrent, chronic Diagnosis:   Patient Active Problem List   Diagnosis Date Noted  . Major depression, recurrent, chronic [F33.9] 12/02/2014  . Mood disorder [F39] 12/01/2014  . Chronic back pain [M54.9, G89.29]   . Hyperparathyroidism, secondary renal [N25.81] 07/31/2014  . Neuromuscular disorder [G70.9] 07/31/2014  . Lower back pain [M54.5] 09/21/2013  . Diastolic heart failure [I50.30] 03/17/2012  . Syncope and collapse [R55] 02/12/2012  . Hypertension [I10] 02/04/2012  . Chronic kidney disease [N18.9] 02/04/2012  . Edema [R60.9] 02/04/2012  . Tachycardia [R00.0] 02/04/2012  . Encounter for long-term (current) use of medications [Z79.899] 02/04/2012  . Anxiety and depression [F41.8] 02/04/2012  . Hypothyroid [E03.9] 02/04/2012  . Hyperlipidemia [E78.5] 02/04/2012     Continued Clinical Symptoms:  Alcohol Use Disorder Identification Test Final Score (AUDIT): 0 The "Alcohol Use Disorders Identification Test", Guidelines for Use in Primary Care, Second Edition.  World Science writerHealth Organization [facility](WHO). Score between 0-7:  no or low risk or alcohol related problems. Score between 8-15:  moderate risk of alcohol related problems. Score between 16-19:  high risk of alcohol related problems. Score 20 or above:  warrants further diagnostic evaluation for alcohol dependence and treatment.   CLINICAL FACTORS:  59 year old female, presents with worsening depression and recent onset of suicidal ideations, mostly passive. States she has been depressed x several years, at least partially due to her  chronic pain .    Musculoskeletal: Strength & Muscle Tone: within normal limits Gait & Station: walks slowly with cane  Patient leans: N/A  Psychiatric Specialty Exam: Physical Exam  ROS  Blood pressure 114/60, pulse 81, temperature 98 F (36.7 C), temperature source Oral, resp. rate 18, height 5\' 2"  (1.575 m), weight 200 lb (90.719 kg).Body mass index is 36.57 kg/(m^2).  See Admit Note MSE                                                        COGNITIVE FEATURES THAT CONTRIBUTE TO RISK:  Loss of executive function    SUICIDE RISK:   Moderate:  Frequent suicidal ideation with limited intensity, and duration, some specificity in terms of plans, no associated intent, good self-control, limited dysphoria/symptomatology, some risk factors present, and identifiable protective factors, including available and accessible social support.  PLAN OF CARE: Patient will be admitted to inpatient psychiatric unit for stabilization and safety. Will provide and encourage milieu participation. Provide medication management and maked adjustments as needed.  Will follow daily.      I certify that inpatient services furnished can reasonably be expected to improve the patient's condition.   Wynne Jury 12/02/2014, 11:26 AM

## 2014-12-02 NOTE — [facility] Group Notes (Signed)
[facility]  12/02/2014 8:45 AM  Participation Quality: Alert, Appropriate and Oriented  Mood/Affect: Flat  Depression Rating: 7  Anxiety Rating: 7  Thoughts of Suicide: Pt denies SI/HI  Will you contract for safety? Yes  Current AVH: Pt denies  Plan for Discharge/Comments: Pt attended discharge planning group and actively participated in group. CSW discussed suicide prevention education with the group and encouraged them to discuss discharge planning and any relevant barriers. Pt expressed that she is feeling better and has no concerns at this time. Pt reports that she can return home and will need referral for aftercare.  Transportation Means: Pt reports access to transportation  Supports: No supports mentioned at this time  Chad CordialLauren Carter, LCSWA 12/02/2014 1:16 PM

## 2014-12-02 NOTE — Consult Note (Signed)
Requesting physician: Dr. Nehemiah Massed  Reason for consultation: Acute on chronic kidney disease stage III   History of Present Illness: 59 year old female with history of hypertension, hypothyroidism, major depression, GERD, anxiety, sleep apnea, chronic kidney disease stage III (secondary to NSAID use) with baseline creatinine of 1.5-1.7 (recently saw Dr Geanie Berlin), chronic leg edema on Lasix was [facility] to [facility] for recurrent major depression.  Routine blood will done showed worsening creatinine of 1.83 and hospitalist consultation requested for evaluation. Patient denies use of NSAIDs or poor fluid intake. she denies headache, blurred vision, dizziness, chest pain, palpitation, shortness of breath, nausea or vomiting. She does report some chills and fever. She also reports mild dysuria for the past few days. She was seen by her PCP yesterday to home she reported having watery diarrhea for last few days and had poor appetite. She reports feeling depressed but denies any suicidal ideations.  Vitals stable. Blood will done showed normal CBC. Chemistry showed potassium of 3.5. Creatinine of 1.83. Chest x-ray is unremarkable.  Allergies:  No Known Allergies    Past Medical History  Diagnosis Date  . Hypertension   . Cholesterol serum increased   . Thyroid disease   . Sleep apnea   . Migraine   . Depression   . Neuromuscular disorder   . GERD (gastroesophageal reflux disease)   . Anxiety   . Chronic back pain       Chronic kidney disease stage III  Past Surgical History  Procedure Laterality Date  . Abdominal hysterectomy    . Back surgery    . Appendectomy      Medications:  Scheduled Meds: . cholecalciferol  1,000 Units Oral Q1200  . [START ON 12/03/2014] DULoxetine  30 mg Oral Daily  . furosemide  40 mg Oral Daily  . gabapentin  600 mg Oral BID  . levothyroxine  50 mcg Oral QAC breakfast  . lidocaine  1 patch Transdermal Daily  . potassium  chloride SA  20 mEq Oral BID   Continuous Infusions:  PRN Meds:.acetaminophen, alum & mag hydroxide-simeth, hydrOXYzine, magnesium hydroxide, oxyCODONE, SUMAtriptan  Social History:  reports that she has never smoked. She has never used smokeless tobacco. She reports that she does not drink alcohol or use illicit drugs.  Family History  Problem Relation Age of Onset  . Diabetes Mother   . Heart disease Mother   . Kidney disease Mother   . Thyroid disease Mother   . Heart disease Father   . Seizures Father   . COPD Father   . Cancer Maternal Grandmother     Review of Systems:  Constitutional: Chills, or appetite and fatigue,  Denies fever, , diaphoresis,  HEENT: Denies visual or hearing symptoms, congestion, trouble swallowing, neck pain or stiffness  Respiratory: Denies SOB, DOE, cough, chest tightness,  and wheezing.   Cardiovascular: Denies chest pain, palpitations and leg swelling.  Gastrointestinal: Denies nausea, vomiting, abdominal pain, diarrhea, constipation, blood in stool and abdominal distention.  Genitourinary: dysuria+, denies hematuria, flank pain and difficulty urinating.  Endocrine: Denies: hot or cold intolerance, polyuria, polydipsia. Musculoskeletal: Denies myalgias, back pain, joint a nor swelling Skin: Denies , rash and wound.  Neurological: Denies dizziness, syncope, weakness, light-headedness, numbness and headaches.  Hematological: Denies adenopathy.  Psychiatric/Behavioral: Depression +++,  Denies suicidal ideation, mood changes, confusion, nervousness, sleep disturbance and agitation   Physical Exam:  Filed Vitals:   12/01/14 2102 12/02/14 0625 12/02/14 0626  BP: 114/64 107/65 114/60  Pulse: 80 79  81  Temp: 98.3 F (36.8 C) 98 F (36.7 C)   TempSrc: Oral Oral   Resp: 18    Height: 5\' 2"  (1.575 m)    Weight: 90.719 kg (200 lb)      No intake or output data in the 24 hours ending 12/02/14 1729  General: Middle aged female in no acute  distress HEENT: No pallor, moist oral mucosa, supple neck Chest: Clear to percussion bilaterally, no added sounds CVS: Normal S1 and S2, no murmurs GI: Soft, nondistended, nontender Musculoskeletal: Warm, no edema CNS: Alert and oriented  Labs on Admission:  CBC:    Component Value Date/Time   WBC 6.1 12/01/2014 1120   WBC 7.1 11/08/2014 1257   HGB 13.0 12/01/2014 1120   HGB 12.7 11/08/2014 1257   HCT 41.4 12/01/2014 1120   HCT 39.0 11/08/2014 1257   PLT 238 12/01/2014 1120   MCV 90.0 12/01/2014 1120   MCV 85.9 11/08/2014 1257   NEUTROABS 5.7 09/22/2012 0949   LYMPHSABS 2.6 09/22/2012 0949   MONOABS 0.7 09/22/2012 0949   EOSABS 0.1 09/22/2012 0949   BASOSABS 0.0 09/22/2012 0949    Basic Metabolic Panel:    Component Value Date/Time   NA 139 12/01/2014 1120   NA 139 11/22/2014   K 3.5 12/01/2014 1120   CL 104 12/01/2014 1120   CO2 27 12/01/2014 1120   BUN 15 12/01/2014 1120   CREATININE 1.83* 12/01/2014 1120   CREATININE 1.7* 11/22/2014   CREATININE 1.55* 11/08/2014 1250   GLUCOSE 97 12/01/2014 1120   CALCIUM 10.1 12/01/2014 1120    Radiological Exams on Admission: Dg Chest 2 View  12/01/2014   CLINICAL DATA:  59 year old female with shortness of breath and suicidal ideation.  EXAM: CHEST  2 VIEW  COMPARISON:  Prior chest x-ray 04/17/2014  FINDINGS: The lungs are clear and negative for focal airspace consolidation, pulmonary edema or suspicious pulmonary nodule. No pleural effusion or pneumothorax. Cardiac and mediastinal contours are within normal limits. No acute fracture or lytic or blastic osseous lesions. The visualized upper abdominal bowel gas pattern is unremarkable.  IMPRESSION: No active cardiopulmonary disease.   Electronically Signed   By: Malachy MoanHeath  McCullough M.D.   On: 12/01/2014 13:59    Assessment/Plan Principal Problem: Acute on chronic kidney disease stage III Mildly worsened renal function from her baseline. Likely prerenal secondary to poor by mouth  intake versus related to UTI with symptomatically dysuria. Will check UA and urine culture and urine lites. -Continue to avoid nephrotoxic. Patient was previously on twice a day Lasix which has recently been reduced to once a day as outpatient. Continue Lasix for now. -Renal ultrasound done in April showing medical renal disease. If urine studies unremarkable would hold off on further intervention. She has recently started following up with nephrologist Dr Geanie Berlincolodanato recommend outpatient follow-up. Please recheck renal function in am to make sure function is not progressively worse. Patient encouraged on increased fluid intake.    Major depression, recurrent, chronic Management per primary team.    hypothyroidism Continue Synthroid.     Time Spent on Admission: 5 minutes  Tsutomu Barfoot 12/02/2014, 5:29 PM

## 2014-12-02 NOTE — Progress Notes (Signed)
D:  Per pt self inventory pt reports sleeping good, appetite fair, energy level low, rates depression at a 7 out of 10, hopelessness at a 0 out of 10, anxiety at a 5 out of 10, denies SI/HI/AVH, depressed/anxious during interaction, goal today:  "Feeling better about myself and work on how to remember how important my family is to me"    A:  Emotional support provided, Encouraged pt to continue with treatment plan and attend all group activities, q15 min checks maintained for safety.  R:  Pt is receptive, going to groups, present in milieu, interacts appropriately, pleasant and cooperative with staff and other patients on the unit.

## 2014-12-02 NOTE — BHH Group Notes (Signed)
BHH LCSW Group Therapy  12/02/2014 1:15pm  Type of Therapy:  Group Therapy vercoming Obstacles  Participation Level:  Active  Participation Quality:  Appropriate   Affect:  Appropriate  Cognitive:  Appropriate and Oriented  Insight:  Developing/Improving and Improving  Engagement in Therapy:  Improving  Modes of Intervention:  Discussion, Exploration, Problem-solving and Support  Description of Group:   In this group patients will be encouraged to explore what they see as obstacles to their own wellness and recovery. They will be guided to discuss their thoughts, feelings, and behaviors related to these obstacles. The group will process together ways to cope with barriers, with attention given to specific choices patients can make. Each patient will be challenged to identify changes they are motivated to make in order to overcome their obstacles. This group will be process-oriented, with patients participating in exploration of their own experiences as well as giving and receiving support and challenge from other group members.  Summary of Patient Progress: Pt participated appropriately in group. Pt verbalized that she felt that she had ignored feelings of depression and anxiety related to her chronic pain which led to her lashing out against her husband. Pt described wanting to change herself but had difficulty identifying a specific area for change. With prompting, Pt was able to verbalize that she would like to be able to learn to express her emotions in a healthy way instead of ignoring them.   Therapeutic Modalities:   Cognitive Behavioral Therapy Solution Focused Therapy Motivational Interviewing Relapse Prevention Therapy   Chad CordialLauren Carter, LCSWA 12/02/2014 5:39 PM

## 2014-12-03 LAB — CREATININE, URINE, RANDOM: CREATININE, URINE: 76.82 mg/dL

## 2014-12-03 LAB — BASIC METABOLIC PANEL
Anion gap: 8 (ref 5–15)
BUN: 19 mg/dL (ref 6–20)
CO2: 27 mmol/L (ref 22–32)
CREATININE: 1.76 mg/dL — AB (ref 0.44–1.00)
Calcium: 9.7 mg/dL (ref 8.9–10.3)
Chloride: 107 mmol/L (ref 101–111)
GFR calc Af Amer: 35 mL/min — ABNORMAL LOW (ref >60)
GFR, EST NON AFRICAN AMERICAN: 30 mL/min — AB (ref >60)
Glucose, Bld: 118 mg/dL — ABNORMAL HIGH (ref 65–99)
Potassium: 4.5 mmol/L (ref 3.5–5.1)
SODIUM: 142 mmol/L (ref 135–145)

## 2014-12-03 LAB — URINALYSIS, ROUTINE W REFLEX MICROSCOPIC
Bilirubin Urine: NEGATIVE
Glucose, UA: NEGATIVE mg/dL
Hgb urine dipstick: NEGATIVE
Ketones, ur: NEGATIVE mg/dL
NITRITE: NEGATIVE
PROTEIN: NEGATIVE mg/dL
Specific Gravity, Urine: 1.012 (ref 1.005–1.030)
Urobilinogen, UA: 1 mg/dL (ref 0.0–1.0)
pH: 7 (ref 5.0–8.0)

## 2014-12-03 LAB — URINE MICROSCOPIC-ADD ON

## 2014-12-03 LAB — SODIUM, URINE, RANDOM: SODIUM UR: 105 mmol/L

## 2014-12-03 MED ORDER — DULOXETINE HCL 20 MG PO CPEP
40.0000 mg | ORAL_CAPSULE | Freq: Every day | ORAL | Status: DC
  Administered 2014-12-04 – 2014-12-05 (×2): 40 mg via ORAL
  Filled 2014-12-03 (×3): qty 2

## 2014-12-03 MED ORDER — LIDOCAINE 5 % EX PTCH
1.0000 | MEDICATED_PATCH | Freq: Once | CUTANEOUS | Status: AC
  Administered 2014-12-03: 1 via TRANSDERMAL

## 2014-12-03 MED ORDER — CEPHALEXIN 500 MG PO CAPS
500.0000 mg | ORAL_CAPSULE | Freq: Two times a day (BID) | ORAL | Status: DC
  Administered 2014-12-03 – 2014-12-05 (×4): 500 mg via ORAL
  Filled 2014-12-03: qty 2
  Filled 2014-12-03 (×7): qty 1
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
## 2014-12-03 NOTE — B[facility] Group Notes (Signed)
[facility] LCSW Group Therapy 12/03/2014 1:15 PM  Type of Therapy: Group Therapy- Feelings about Diagnosis  Participation Level: Minimal  Participation Quality:  Reserved  Affect:  Appropriate  Cognitive: Alert and Oriented   Insight:  Developing   Engagement in Therapy: Developing/Improving and Engaged   Modes of Intervention: Clarification, Confrontation, Discussion, Education, Exploration, Limit-setting, Orientation, Problem-solving, Rapport Building, Dance movement psychotherapisteality Testing, Socialization and Support  Description of Group:   This group will allow patients to explore their thoughts and feelings about diagnoses they have received. Patients will be guided to explore their level of understanding and acceptance of these diagnoses. Facilitator will encourage patients to process their thoughts and feelings about the reactions of others to their diagnosis, and will guide patients in identifying ways to discuss their diagnosis with significant others in their lives. This group will be process-oriented, with patients participating in exploration of their own experiences as well as giving and receiving support and challenge from other group members.  Summary of Progress/Problems:  Pt was reserved in group discussion today and did not participate verbally. However Pt was observed to be engaged AEB good eye contact and nonverbal affirming gestures.  Therapeutic Modalities:   Cognitive Behavioral Therapy Solution Focused Therapy Motivational Interviewing Relapse Prevention Therapy  Chad CordialLauren Carter, LCSWA 12/03/2014 5:20 PM

## 2014-12-03 NOTE — Progress Notes (Signed)
Adult Psychoeducational Group Note  Date:  12/03/2014 Time:  9:01 PM  Group Topic/Focus:  Wrap-Up Group:   The focus of this group is to help patients review their daily goal of treatment and discuss progress on daily workbooks.  Participation Level:  Active  Participation Quality:  Appropriate and Attentive  Affect:  Appropriate  Cognitive:  Appropriate  Insight: Appropriate  Engagement in Group:  Engaged  Modes of Intervention:  Activity  Additional Comments:  Pts played a therapeutic activity of Mental Health Jeopardy. Pt was engaged and actively participated.  Jennifer Davidson C 12/03/2014, 9:01 PM 

## 2014-12-03 NOTE — Progress Notes (Signed)
D:Pt rates depression as a 4, hopelessness as a 4 and anxiety as a 5 on 0-10 scale with 10 being the most. Pt reports that her depression has decreased since coming to the hospital. Pt has been pleasant and cooperative interacting with staff and peers. A:Offered support, encouragement and 15 minute checks. R:Pt denies si and hi. Safety maintained on the unit.

## 2014-12-03 NOTE — Progress Notes (Signed)
Adult Psychoeducational Group Note  Date:  12/03/2014 Time:    Group Topic/Focus:  Diagnosis Education:   The focus of this group is to discuss the major disorders that patients maybe diagnosed with.  Group discusses the importance of knowing what one's diagnosis is so that one can understand treatment and better advocate for oneself.  Participation Level:  Active  Participation Quality:  Appropriate  Affect:  Appropriate  Cognitive:  Alert and Appropriate  Insight: Appropriate and Good  Engagement in Group:  Developing/Improving  Modes of Intervention:  Discussion and Support  Additional Comments:  Patient will identify one goal for today.  Earline MayotteKnight, Tynisha Ogan Shephard 12/03/2014, 10:16 AM

## 2014-12-03 NOTE — Progress Notes (Signed)
Patient ID: Jennifer Davidson, female   DOB: 03/18/1956, 59 y.o.   MRN: [id number] D: Client visible on the unit, interacts with staff and peers appropriately. Client reports anxiety "2" of 10. "groups was good, they told us to keep trying and never give up" A: Writer provided emotional support, reviewed medications, administered as prescribed. Staff will monitor q5915min for safety. R:Client is safe on the unit, attended group.

## 2014-12-03 NOTE — Progress Notes (Signed)
Recreation Therapy Notes  Animal-Assisted Activity (AAA) Program Checklist/Progress Notes Patient Eligibility Criteria Checklist & Daily Group note for Rec Tx Intervention  Date: 07.19.16 Time: 2:45 pm Location: [address] Dayroom   AAA/T Program Assumption of Risk Form signed by Patient/ or Parent Legal Guardian yes  Patient is free of allergies or sever asthma yes  Patient reports no fear of animals yes  Patient reports no history of cruelty to animalsyes  Patient understands his/her participation is voluntary yes  Patient washes hands before animal contact yes  Patient washes hands after animal contact yes  Behavioral Response: Engaged  Education: Hand Washing, Appropriate Animal Interaction   Education Outcome: Acknowledges understanding/In group clarification offered  Clinical Observations/Feedback: Patient attended group.   Jennifer Davidson, LRT/CTRS         Jennifer Davidson 12/03/2014 3:57 PM 

## 2014-12-03 NOTE — Plan of Care (Signed)
Problem: Alteration in mood Goal: LTG-Patient reports reduction in suicidal thoughts (Patient reports reduction in suicidal thoughts and is able to verbalize a safety plan for whenever patient is feeling suicidal)  Outcome: Progressing Pt denies si today Goal: LTG-Pt's behavior demonstrates decreased signs of depression 12/02/2014: Goal not met. Pt was admitted with symptoms of depression, rating 7/10. Pt continues to present with flat affect and depressive symptoms. Pt will demonstrate decreased symptoms of depression and rate depression at 3/10 or lower prior to discharge.   Peri Maris, LCSWA Clinical Social Worker [phone]   (Patient's behavior demonstrates decreased signs of depression to the point the patient is safe to return home and continue treatment in an outpatient setting)  Outcome: Progressing Pt rates depression as a 4 today on 1-10 scale.

## 2014-12-03 NOTE — Progress Notes (Signed)
[facility] MD Progress Note  12/03/2014 4:33 PM Jennifer Davidson  MRN:  [id number] Subjective:   Patient states she is feeling better- is less depressed, and states she feels she is able to walk a little better as well ( on lidocaine patch) States she had a good visit with her daughter and that she has been able to speak with husband via phone . Objective : I have discussed case with treatment team and have met with patient. She remains depressed, but has improved compared to admission. Appreciate hospitalist consult regarding history of renal insufficiency. Has been started on Keflex for UTI.  At this time patient tolerating medications well, denies side effects. No disruptive  Behaviors on unit- has been going to some groups and is calm, pleasant upon approach. No medication side effects reported . Cr- 1.76 ( mild improvement compared to prior)  Principal Problem: Major depression, recurrent, chronic Diagnosis:   Patient Active Problem List   Diagnosis Date Noted  . Major depression, recurrent, chronic [F33.9] 12/02/2014  . Mood disorder [F39] 12/01/2014  . Chronic back pain [M54.9, G89.29]   . Hyperparathyroidism, secondary renal [N25.81] 07/31/2014  . Neuromuscular disorder [G70.9] 07/31/2014  . Lower back pain [M54.5] 09/21/2013  . Diastolic heart failure [X93.71] 03/17/2012  . Syncope and collapse [R55] 02/12/2012  . Hypertension [I10] 02/04/2012  . Chronic kidney disease [N18.9] 02/04/2012  . Edema [R60.9] 02/04/2012  . Tachycardia [R00.0] 02/04/2012  . Encounter for long-term (current) use of medications [Z79.899] 02/04/2012  . Anxiety and depression [F41.8] 02/04/2012  . Hypothyroid [E03.9] 02/04/2012  . Hyperlipidemia [E78.5] 02/04/2012   Total Time spent with patient: 20 minutes   Past Medical History:  Past Medical History  Diagnosis Date  . Hypertension   . Cholesterol serum increased   . Thyroid disease   . Sleep apnea   . Migraine   . Depression   . Neuromuscular  disorder   . GERD (gastroesophageal reflux disease)   . Anxiety   . Chronic back pain     Past Surgical History  Procedure Laterality Date  . Abdominal hysterectomy    . Back surgery    . Appendectomy     Family History:  Family History  Problem Relation Age of Onset  . Diabetes Mother   . Heart disease Mother   . Kidney disease Mother   . Thyroid disease Mother   . Heart disease Father   . Seizures Father   . COPD Father   . Cancer Maternal Grandmother    Social History:  History  Alcohol Use No     History  Drug Use No    History   Social History  . Marital Status: Married    Spouse Name: N/A  . Number of Children: N/A  . Years of Education: N/A   Social History Main Topics  . Smoking status: Never Smoker   . Smokeless tobacco: Never Used  . Alcohol Use: No  . Drug Use: No  . Sexual Activity:    Partners: Male     Comment: married   Other Topics Concern  . None   Social History Narrative   Additional History:    Sleep: Fair  Appetite:  Good   Assessment:   Musculoskeletal: Strength & Muscle Tone: within normal limits Gait & Station: walks with cane- gait improving compared to admission Patient leans: N/A   Psychiatric Specialty Exam: Physical Exam  ROS denies nausea, denies vomiting.  No fever , no chills, no dysuria reported at  this time.  Blood pressure 108/68, pulse 83, temperature 98.6 F (37 C), temperature source Oral, resp. rate 18, height 5' 2"  (1.575 m), weight 200 lb (90.719 kg).Body mass index is 36.57 kg/(m^2).  General Appearance: Fairly Groomed  Engineer, water::  Good  Speech:  Normal Rate  Volume:  Normal  Mood:  less depressed   Affect:  constricted but more reactive  Thought Process:  Goal Directed and Linear  Orientation:  Full (Time, Place, and Person)  Thought Content:  denies hallucinations, no delusions  Suicidal Thoughts:  No- at this time denies thoughts of hurting self or of SI  Homicidal Thoughts:  No  Memory:   Recent and remote grossly intact   Judgement:  Other:  improved   Insight:  improved   Psychomotor Activity:  Normal  Concentration:  Good  Recall:  Good  Fund of Knowledge:Good  Language: Good  Akathisia:  Negative  Handed:  Right  AIMS (if indicated):     Assets:  Communication Skills Desire for Improvement Housing Resilience Social Support  ADL's:  Improving   Cognition: WNL  Sleep:  Number of Hours: 5.5     Current Medications: Current Facility-Administered Medications  Medication Dose Route Frequency Provider Last Rate Last Dose  . acetaminophen (TYLENOL) tablet 650 mg  650 mg Oral Q6H PRN Harriet Butte, NP      . alum & mag hydroxide-simeth (MAALOX/MYLANTA) 200-200-20 MG/5ML suspension 30 mL  30 mL Oral Q4H PRN Harriet Butte, NP      . cephALEXin (KEFLEX) capsule 500 mg  500 mg Oral Q12H Nishant Dhungel, MD   500 mg at 12/03/14 1211  . cholecalciferol (VITAMIN D) tablet 1,000 Units  1,000 Units Oral Q1200 Harriet Butte, NP   1,000 Units at 12/03/14 1211  . [START ON 12/04/2014] DULoxetine (CYMBALTA) DR capsule 40 mg  40 mg Oral Daily Fernando A Cobos, MD      . furosemide (LASIX) tablet 40 mg  40 mg Oral Daily Harriet Butte, NP   40 mg at 12/03/14 0829  . gabapentin (NEURONTIN) capsule 600 mg  600 mg Oral BID Jenne Campus, MD   600 mg at 12/03/14 1448  . hydrOXYzine (ATARAX/VISTARIL) tablet 25 mg  25 mg Oral TID PRN Harriet Butte, NP   25 mg at 12/02/14 2152  . levothyroxine (SYNTHROID, LEVOTHROID) tablet 50 mcg  50 mcg Oral QAC breakfast Harriet Butte, NP   50 mcg at 12/03/14 1856  . lidocaine (LIDODERM) 5 % 1 patch  1 patch Transdermal Daily Jenne Campus, MD   1 patch at 12/03/14 0829  . magnesium hydroxide (MILK OF MAGNESIA) suspension 30 mL  30 mL Oral Daily PRN Harriet Butte, NP      . oxyCODONE (Oxy IR/ROXICODONE) immediate release tablet 5 mg  5 mg Oral Q6H PRN Harriet Butte, NP   5 mg at 12/01/14 2213  . potassium chloride SA (K-DUR,KLOR-CON)  CR tablet 20 mEq  20 mEq Oral BID Harriet Butte, NP   20 mEq at 12/03/14 0829  . SUMAtriptan (IMITREX) tablet 50 mg  50 mg Oral Q2H PRN Harriet Butte, NP   50 mg at 12/02/14 2152    Lab Results:  Results for orders placed or performed during the hospital encounter of 12/01/14 (from the past 48 hour(s))  Basic metabolic panel     Status: Abnormal   Collection Time: 12/03/14  6:10 AM  Result Value Ref Range  Sodium 142 135 - 145 mmol/L   Potassium 4.5 3.5 - 5.1 mmol/L   Chloride 107 101 - 111 mmol/L   CO2 27 22 - 32 mmol/L   Glucose, Bld 118 (H) 65 - 99 mg/dL   BUN 19 6 - 20 mg/dL   Creatinine, Ser 1.76 (H) 0.44 - 1.00 mg/dL   Calcium 9.7 8.9 - 10.3 mg/dL   GFR calc non Af Amer 30 (L) >60 mL/min   GFR calc Af Amer 35 (L) >60 mL/min    Comment: (NOTE) The eGFR has been calculated using the CKD EPI equation. This calculation has not been validated in all clinical situations. eGFR's persistently <60 mL/min signify possible Chronic Kidney Disease.    Anion gap 8 5 - 15    Comment: Performed at [facility]  Urinalysis, Routine w reflex microscopic (not at [facility])     Status: Abnormal   Collection Time: 12/03/14  6:48 AM  Result Value Ref Range   Color, Urine YELLOW YELLOW   APPearance CLEAR CLEAR   Specific Gravity, Urine 1.012 1.005 - 1.030   pH 7.0 5.0 - 8.0   Glucose, UA NEGATIVE NEGATIVE mg/dL   Hgb urine dipstick NEGATIVE NEGATIVE   Bilirubin Urine NEGATIVE NEGATIVE   Ketones, ur NEGATIVE NEGATIVE mg/dL   Protein, ur NEGATIVE NEGATIVE mg/dL   Urobilinogen, UA 1.0 0.0 - 1.0 mg/dL   Nitrite NEGATIVE NEGATIVE   Leukocytes, UA SMALL (A) NEGATIVE    Comment: Performed at [facility]  Creatinine, urine, random     Status: None   Collection Time: 12/03/14  6:48 AM  Result Value Ref Range   Creatinine, Urine 76.82 mg/dL    Comment: Performed at [facility]  Sodium, urine, random     Status: None   Collection Time: 12/03/14  6:48  AM  Result Value Ref Range   Sodium, Ur 105 mmol/L    Comment: Performed at [facility]  Urine microscopic-add on     Status: Abnormal   Collection Time: 12/03/14  6:48 AM  Result Value Ref Range   Squamous Epithelial / LPF RARE RARE   WBC, UA 3-6 <3 WBC/hpf   RBC / HPF 0-2 <3 RBC/hpf   Bacteria, UA RARE RARE   Casts HYALINE CASTS (A) NEGATIVE    Comment: Performed at [facility]    Physical Findings: AIMS: Facial and Oral Movements Muscles of Facial Expression: None, normal Lips and Perioral Area: None, normal Jaw: None, normal Tongue: None, normal,Extremity Movements Upper (arms, wrists, hands, fingers): None, normal Lower (legs, knees, ankles, toes): None, normal, Trunk Movements Neck, shoulders, hips: None, normal, Overall Severity Severity of abnormal movements (highest score from questions above): None, normal Incapacitation due to abnormal movements: None, normal Patient's awareness of abnormal movements (rate only patient's report): No Awareness, Dental Status Current problems with teeth and/or dentures?: No Does patient usually wear dentures?: No  CIWA:    COWS:     Assessment -  At this time patient is improved compared to her admission- she presents with improving mood and improved range of affect .  Thus far she is tolerating Cymbalta well. ( Creatinine clearance calculated at 48.6)  Denies any SI at this time.  Chronic renal insufficiency is being monitored by Hospitalist Service- Creatinine levels slightly improved compared to admission. Currently being treated for UTI. Chronic pain partially improved with lidocaine patch, ambulating with less difficulty.     Treatment Plan Summary: Daily contact with  patient to assess and evaluate symptoms and progress in treatment, Medication management, Plan inpatient treatment and medications as below Cymbalta now at 40 mgrs QDAY for management of depression and anxiety. Neurontin 600 mgrs BID  for pain, anxiety Lidocaine patch for chronic back pain Syntrhoid for history of hypothyroidism On Keflex for UTI- hospitalist following for medical issues/renal insufficiency  Medical Decision Making:  Established Problem, Stable/Improving (1), Review of Psycho-Social Stressors (1), Review or order clinical lab tests (1) and Review of Medication Regimen & Side Effects (2)     COBOS, FERNANDO 12/03/2014, 4:33 PM

## 2014-12-03 NOTE — Progress Notes (Signed)
Labs reviewed from yesterday. UA with possible UTI. Urine culture pending. Given symptoms of mild dysuria I will treat her with Keflex 500 mg twice daily for 5 days. Please follow urine culture. Creatinine mildly improved this morning. Recommend to continue current dose of Lasix and encourage fluid intake. FeNa of 1.5. No further recommendations. We'll sign off. Recommend to continue following up with her PCP and nephrologist.

## 2014-12-04 LAB — URINE CULTURE

## 2014-12-04 NOTE — [facility] Group Notes (Signed)
[facility] LCSW Group Therapy 12/04/2014 1:15 PM  Type of Therapy: Group Therapy- Emotion Regulation  Participation Level: Active   Participation Quality:  Appropriate  Affect: Appropriate  Cognitive: Alert and Oriented   Insight:  Developing/Improving  Engagement in Therapy: Developing/Improving and Engaged   Modes of Intervention: Clarification, Confrontation, Discussion, Education, Exploration, Limit-setting, Orientation, Problem-solving, Rapport Building, Dance movement psychotherapisteality Testing, Socialization and Support  Summary of Progress/Problems: The topic for group today was emotional regulation. This group focused on both positive and negative emotion identification and allowed group members to process ways to identify feelings, regulate negative emotions, and find healthy ways to manage internal/external emotions. Group members were asked to reflect on a time when their reaction to an emotion led to a negative outcome and explored how alternative responses using emotion regulation would have benefited them. Group members were also asked to discuss a time when emotion regulation was utilized when a negative emotion was experienced. Pt remains reserved in group discussion, however identified frustration as an emotion that has been difficult to manage recently. Pt expressed a desire to learn how to manage this more effectively.  Pt is attentive during group discussion and is engaged with peers.   Chad CordialLauren Carter, LCSWA 12/04/2014 4:15 PM

## 2014-12-04 NOTE — Progress Notes (Addendum)
Patient ID: Jennifer Davidson, female   DOB: 05/28/1955, 59 y.o.   MRN: [id number] [facility] MD Progress Note  12/04/2014 3:50 PM Jennifer Davidson  MRN:  [id number] Subjective:    Patient reports significant improvement in mood, states she feels less depressed, and is starting to focus more on discharge, wanting to be discharged soon. She is tolerating medications well. She states lidocaine patch has been helpful to alleviate her back pain. Objective : I have discussed case with treatment team and have met with patient. Presents significantly improved compared to admission, with an improved range of affect, no longer describing neuro-vegetative symptoms of depression. Future oriented, looking forward to returning home and reuniting with husband. She is going to groups, and is visible and participates in milieu. Denies medication side effects. Of note, reports partial improvement of chronic back pain , and feels that lidocaine patch has been helpful.  Principal Problem: Major depression, recurrent, chronic Diagnosis:   Patient Active Problem List   Diagnosis Date Noted  . Major depression, recurrent, chronic [F33.9] 12/02/2014  . Mood disorder [F39] 12/01/2014  . Chronic back pain [M54.9, G89.29]   . Hyperparathyroidism, secondary renal [N25.81] 07/31/2014  . Neuromuscular disorder [G70.9] 07/31/2014  . Lower back pain [M54.5] 09/21/2013  . Diastolic heart failure [W10.27] 03/17/2012  . Syncope and collapse [R55] 02/12/2012  . Hypertension [I10] 02/04/2012  . Chronic kidney disease [N18.9] 02/04/2012  . Edema [R60.9] 02/04/2012  . Tachycardia [R00.0] 02/04/2012  . Encounter for long-term (current) use of medications [Z79.899] 02/04/2012  . Anxiety and depression [F41.8] 02/04/2012  . Hypothyroid [E03.9] 02/04/2012  . Hyperlipidemia [E78.5] 02/04/2012   Total Time spent with patient: 20 minutes   Past Medical History:  Past Medical History  Diagnosis Date  . Hypertension   . Cholesterol serum  increased   . Thyroid disease   . Sleep apnea   . Migraine   . Depression   . Neuromuscular disorder   . GERD (gastroesophageal reflux disease)   . Anxiety   . Chronic back pain     Past Surgical History  Procedure Laterality Date  . Abdominal hysterectomy    . Back surgery    . Appendectomy     Family History:  Family History  Problem Relation Age of Onset  . Diabetes Mother   . Heart disease Mother   . Kidney disease Mother   . Thyroid disease Mother   . Heart disease Father   . Seizures Father   . COPD Father   . Cancer Maternal Grandmother    Social History:  History  Alcohol Use No     History  Drug Use No    History   Social History  . Marital Status: Married    Spouse Name: N/A  . Number of Children: N/A  . Years of Education: N/A   Social History Main Topics  . Smoking status: Never Smoker   . Smokeless tobacco: Never Used  . Alcohol Use: No  . Drug Use: No  . Sexual Activity:    Partners: Male     Comment: married   Other Topics Concern  . None   Social History Narrative   Additional History:    Sleep: Fair  Appetite:  Good   Assessment:   Musculoskeletal: Strength & Muscle Tone: within normal limits Gait & Station: walks with cane- gait improving compared to admission Patient leans: N/A   Psychiatric Specialty Exam: Physical Exam  ROS denies nausea, denies vomiting.  No fever ,  no chills, no dysuria reported at this time.  Blood pressure 114/68, pulse 83, temperature 98.4 F (36.9 C), temperature source Oral, resp. rate 16, height 5' 2"  (1.575 m), weight 200 lb (90.719 kg).Body mass index is 36.57 kg/(m^2).  General Appearance: improved grooming  Eye Contact::  Good  Speech:  Normal Rate  Volume:  Normal  Mood:  less depressed   Affect:   Improving, brighter   Thought Process:  Goal Directed and Linear  Orientation:  Full (Time, Place, and Person)  Thought Content:  denies hallucinations, no delusions  Suicidal Thoughts:   No- at this time denies thoughts of hurting self or of SI  Homicidal Thoughts:  No  Memory:  Recent and remote grossly intact   Judgement:  Other:  improved   Insight:  improved   Psychomotor Activity:  Normal  Concentration:  Good  Recall:  Good  Fund of Knowledge:Good  Language: Good  Akathisia:  Negative  Handed:  Right  AIMS (if indicated):     Assets:  Communication Skills Desire for Improvement Housing Resilience Social Support  ADL's:  Improving   Cognition: WNL  Sleep:  Number of Hours: 4.25     Current Medications: Current Facility-Administered Medications  Medication Dose Route Frequency Provider Last Rate Last Dose  . acetaminophen (TYLENOL) tablet 650 mg  650 mg Oral Q6H PRN Harriet Butte, NP   650 mg at 12/04/14 1123  . alum & mag hydroxide-simeth (MAALOX/MYLANTA) 200-200-20 MG/5ML suspension 30 mL  30 mL Oral Q4H PRN Harriet Butte, NP      . cephALEXin (KEFLEX) capsule 500 mg  500 mg Oral Q12H Nishant Dhungel, MD   500 mg at 12/04/14 0737  . cholecalciferol (VITAMIN D) tablet 1,000 Units  1,000 Units Oral Q1200 Harriet Butte, NP   1,000 Units at 12/04/14 1123  . DULoxetine (CYMBALTA) DR capsule 40 mg  40 mg Oral Daily Jenne Campus, MD   40 mg at 12/04/14 0737  . furosemide (LASIX) tablet 40 mg  40 mg Oral Daily Harriet Butte, NP   40 mg at 12/04/14 0737  . gabapentin (NEURONTIN) capsule 600 mg  600 mg Oral BID Jenne Campus, MD   600 mg at 12/04/14 1610  . hydrOXYzine (ATARAX/VISTARIL) tablet 25 mg  25 mg Oral TID PRN Harriet Butte, NP   25 mg at 12/03/14 2127  . levothyroxine (SYNTHROID, LEVOTHROID) tablet 50 mcg  50 mcg Oral QAC breakfast Harriet Butte, NP   50 mcg at 12/04/14 9604  . lidocaine (LIDODERM) 5 % 1 patch  1 patch Transdermal Daily Jenne Campus, MD   1 patch at 12/04/14 [phone]  . magnesium hydroxide (MILK OF MAGNESIA) suspension 30 mL  30 mL Oral Daily PRN Harriet Butte, NP      . oxyCODONE (Oxy IR/ROXICODONE) immediate release  tablet 5 mg  5 mg Oral Q6H PRN Harriet Butte, NP   5 mg at 12/03/14 2130  . potassium chloride SA (K-DUR,KLOR-CON) CR tablet 20 mEq  20 mEq Oral BID Harriet Butte, NP   20 mEq at 12/04/14 0738  . SUMAtriptan (IMITREX) tablet 50 mg  50 mg Oral Q2H PRN Harriet Butte, NP   50 mg at 12/02/14 2152    Lab Results:  Results for orders placed or performed during the hospital encounter of 12/01/14 (from the past 48 hour(s))  Basic metabolic panel     Status: Abnormal   Collection Time: 12/03/14  6:10 AM  Result Value Ref Range   Sodium 142 135 - 145 mmol/L   Potassium 4.5 3.5 - 5.1 mmol/L   Chloride 107 101 - 111 mmol/L   CO2 27 22 - 32 mmol/L   Glucose, Bld 118 (H) 65 - 99 mg/dL   BUN 19 6 - 20 mg/dL   Creatinine, Ser 1.76 (H) 0.44 - 1.00 mg/dL   Calcium 9.7 8.9 - 10.3 mg/dL   GFR calc non Af Amer 30 (L) >60 mL/min   GFR calc Af Amer 35 (L) >60 mL/min    Comment: (NOTE) The eGFR has been calculated using the CKD EPI equation. This calculation has not been validated in all clinical situations. eGFR's persistently <60 mL/min signify possible Chronic Kidney Disease.    Anion gap 8 5 - 15    Comment: Performed at [facility]  Urine culture     Status: None   Collection Time: 12/03/14  6:48 AM  Result Value Ref Range   Specimen Description      URINE, RANDOM Performed at [facility]    Special Requests      NONE Performed at [place], SUGGEST RECOLLECTION IF CLINICALLY INDICATED Performed at [facility]    Report Status 12/04/2014 FINAL   Urinalysis, Routine w reflex microscopic (not at [facility])     Status: Abnormal   Collection Time: 12/03/14  6:48 AM  Result Value Ref Range   Color, Urine YELLOW YELLOW   APPearance CLEAR CLEAR   Specific Gravity, Urine 1.012 1.005 - 1.030   pH 7.0 5.0 - 8.0   Glucose, UA NEGATIVE NEGATIVE mg/dL   Hgb urine dipstick NEGATIVE NEGATIVE    Bilirubin Urine NEGATIVE NEGATIVE   Ketones, ur NEGATIVE NEGATIVE mg/dL   Protein, ur NEGATIVE NEGATIVE mg/dL   Urobilinogen, UA 1.0 0.0 - 1.0 mg/dL   Nitrite NEGATIVE NEGATIVE   Leukocytes, UA SMALL (A) NEGATIVE    Comment: Performed at [facility]  Creatinine, urine, random     Status: None   Collection Time: 12/03/14  6:48 AM  Result Value Ref Range   Creatinine, Urine 76.82 mg/dL    Comment: Performed at [facility]  Sodium, urine, random     Status: None   Collection Time: 12/03/14  6:48 AM  Result Value Ref Range   Sodium, Ur 105 mmol/L    Comment: Performed at [facility]  Urine microscopic-add on     Status: Abnormal   Collection Time: 12/03/14  6:48 AM  Result Value Ref Range   Squamous Epithelial / LPF RARE RARE   WBC, UA 3-6 <3 WBC/hpf   RBC / HPF 0-2 <3 RBC/hpf   Bacteria, UA RARE RARE   Casts HYALINE CASTS (A) NEGATIVE    Comment: Performed at [facility]    Physical Findings: AIMS: Facial and Oral Movements Muscles of Facial Expression: None, normal Lips and Perioral Area: None, normal Jaw: None, normal Tongue: None, normal,Extremity Movements Upper (arms, wrists, hands, fingers): None, normal Lower (legs, knees, ankles, toes): None, normal, Trunk Movements Neck, shoulders, hips: None, normal, Overall Severity Severity of abnormal movements (highest score from questions above): None, normal Incapacitation due to abnormal movements: None, normal Patient's awareness of abnormal movements (rate only patient's report): No Awareness, Dental Status Current problems with teeth and/or dentures?: No Does patient usually wear dentures?: No  CIWA:  COWS:     Assessment -  Patient continues to improve and mood and affect are significantly better than upon admission. No SI. Tolerating medications well. Denies side effects. Back pain has decreased , which is felt to also be contributing to improving mood-  lidocaine patch helpful thus far. Would continue Cymbalta at current dose, as good tolerance, improving clinically, and would want to titrate slowly due to chronic renal insufficiency.      Treatment Plan Summary: Daily contact with patient to assess and evaluate symptoms and progress in treatment, Medication management, Plan inpatient treatment and medications as below Cymbalta   40 mgrs QDAY for management of depression and anxiety. Neurontin 600 mgrs BID for pain, anxiety Lidocaine patch Q 12 hours ( daily )  PRN  for chronic back pain. Also on Oxycodone PRNs for severe pain, if needed . Syntrhoid for history of hypothyroidism On Keflex for UTI- hospitalist following for medical issues/renal insufficiency Consider discharge soon as she continues to improve   Medical Decision Making:  Established Problem, Stable/Improving (1), Review of Psycho-Social Stressors (1), Review or order clinical lab tests (1) and Review of Medication Regimen & Side Effects (2)     Lenin Kuhnle 12/04/2014, 3:50 PM

## 2014-12-04 NOTE — Progress Notes (Signed)
[facility] Group Notes:  (Nursing/MHT/Case Management/Adjunct)  Date:  12/04/2014  Time:  7:59 PM  Type of Therapy:  Psychoeducational Skills  Participation Level:  Active  Participation Quality:  Appropriate  Affect:  Appropriate  Cognitive:  Appropriate  Insight:  Good  Engagement in Group:  Engaged  Modes of Intervention:  Discussion  Summary of Progress/Problems: Tonight in Wrap up group Jennifer Davidson said that her day was a 10 she said shes feeling much better and her family has been here every night to visit her so that's kept her spirits high as well.  [name] N Virgilio Broadhead 12/04/2014, 7:59 PM

## 2014-12-04 NOTE — Progress Notes (Signed)
Patient ID: Jennifer Davidson, female   DOB: 10/08/1955, 59 y.o.   MRN: [id number]  Writer assumed care of this patient at 1530. Patient was given scheduled 1700 medications by writer without difficulty. Patient did not have any questions or concerns at this time. Writer made sure patient was aware that writer is available if questions or concerns do arise. Patient verbalized understanding. Q15 minute safety checks are maintained.

## 2014-12-04 NOTE — [facility] Group Notes (Signed)
[facility]  12/04/2014 8:45 AM  Participation Quality: Alert, Appropriate and Oriented  Mood/Affect: Appropriate  Depression Rating: 0  Anxiety Rating: 4  Thoughts of Suicide: Pt denies SI/HI  Will you contract for safety? Yes  Current AVH: Pt denies  Plan for Discharge/Comments: Pt attended discharge planning group and actively participated in group. CSW discussed suicide prevention education with the group and encouraged them to discuss discharge planning and any relevant barriers. Pt reports that she is feeling anxious because she is ready to go home. Pt remains agreeable to referrals to outpatient medication management.  Transportation Means: Pt reports access to transportation  Supports: Husband  Chad CordialLauren Carter, Theresia MajorsLCSWA 12/04/2014 2:32 PM

## 2014-12-04 NOTE — Progress Notes (Signed)
D: Patient in the dayroom on approach.  Patient states, "I am feeling much better today.  Patient states he family visited and states it was a good visit.  Patient states she hopes to be discharged tomorrow.  Patient denies SI/HI and denies AVH.   A: Staff to monitor Q 15 mins for safety.  Encouragement and support offered.  Scheduled medications administered per orders. R: Patient remains safe on the unit.  Patient attended group tonight.  Patient visible on the unit and interacting with peers.  Patient taking administered

## 2014-12-04 NOTE — Progress Notes (Signed)
Recreation Therapy Notes  Date: 07.20.16 Time: 9:30 am Location: [address] Group Room  Group Topic: Stress Management  Goal Area(s) Addresses:  Patient will verbalize importance of using healthy stress management.  Patient will identify positive emotions associated with healthy stress management.   Intervention: Stress Management  Activity :  Progressive Muscle Relaxation.  LRT will introduce and educate patients on the stress management technique of progressive muscle relaxation.  Davidson script was used to present  the technique to the patients.  Patients were asked to follow Davidson long with the script read Davidson loud by the LRT.  Education:  Stress Management, Discharge Planning.   Education Outcome: Acknowledges edcuation/In group clarification offered/Needs additional education  Clinical Observations/Feedback: Patient did not attend group.   Jennifer Davidson, LRT/CTRS         Jennifer Davidson 12/04/2014 10:03 AM 

## 2014-12-04 NOTE — Progress Notes (Signed)
D:  Per pt self inventory pt reports sleeping fair, appetite fair, energy level low, ability to pay attention good, rates depression at a 3 out of 10, hopelessness at a 0 out of 10, denies SI/HI/AVH, flat/depressed during interaction, pleasant, goal today: "Feeling better, just keep trying"     A:  Emotional support provided, Encouraged pt to continue with treatment plan and attend all group activities, q15 min checks maintained for safety.  R:  Pt is receptive, going to groups, interactive in the milieu, pleasant and cooperative with staff and other patients on the unit.

## 2014-12-05 DIAGNOSIS — F333 Major depressive disorder, recurrent, severe with psychotic symptoms: Secondary | ICD-10-CM | POA: Insufficient documentation

## 2014-12-05 MED ORDER — SUMATRIPTAN SUCCINATE 50 MG PO TABS: 50.0000 mg | ORAL_TABLET | ORAL | Status: DC | PRN

## 2014-12-05 MED ORDER — GABAPENTIN 300 MG PO CAPS: 600.0000 mg | ORAL_CAPSULE | Freq: Two times a day (BID) | ORAL | Status: DC

## 2014-12-05 MED ORDER — HYDROXYZINE HCL 25 MG PO TABS: 25.0000 mg | ORAL_TABLET | Freq: Three times a day (TID) | ORAL | Status: DC | PRN

## 2014-12-05 MED ORDER — TOPIRAMATE 100 MG PO TABS: 100.0000 mg | ORAL_TABLET | Freq: Every day | ORAL | Status: DC

## 2014-12-05 MED ORDER — LEVOTHYROXINE SODIUM 50 MCG PO TABS: ORAL_TABLET | ORAL | Status: DC

## 2014-12-05 MED ORDER — LIDOCAINE 5 % EX PTCH: 1.0000 | MEDICATED_PATCH | Freq: Every day | CUTANEOUS | Status: DC

## 2014-12-05 MED ORDER — POTASSIUM CHLORIDE CRYS ER 20 MEQ PO TBCR: 20.0000 meq | EXTENDED_RELEASE_TABLET | Freq: Two times a day (BID) | ORAL | Status: DC

## 2014-12-05 MED ORDER — VITAMIN D3 25 MCG (1000 UNIT) PO TABS: 1000.0000 [IU] | ORAL_TABLET | Freq: Every day | ORAL | Status: DC

## 2014-12-05 MED ORDER — CEPHALEXIN 500 MG PO CAPS: 500.0000 mg | ORAL_CAPSULE | Freq: Two times a day (BID) | ORAL | Status: DC

## 2014-12-05 MED ORDER — ESOMEPRAZOLE MAGNESIUM 40 MG PO CPDR: 40.0000 mg | DELAYED_RELEASE_CAPSULE | Freq: Every day | ORAL | Status: DC

## 2014-12-05 MED ORDER — DULOXETINE HCL 40 MG PO CPEP: 40.0000 mg | ORAL_CAPSULE | Freq: Every day | ORAL | Status: DC

## 2014-12-05 MED ORDER — FUROSEMIDE 40 MG PO TABS: 40.0000 mg | ORAL_TABLET | Freq: Every day | ORAL | Status: DC

## 2014-12-05 NOTE — Progress Notes (Signed)
D:  Per pt self inventory pt reports sleeping good, appetite fair, energy level low, ability to pay attention good, rates depression at a 0 out of 10, hopelessness at a 0 out of 10, anxiety at a 1 out of 10, pleasant and bright during interaction this am, denies SI/HI/AVH, goal today: "looking forward to going home, keep looking up"    A:  Emotional support provided, Encouraged pt to continue with treatment plan and attend all group activities, q15 min checks maintained for safety.  R:  Pt is receptive, going to all groups and activities, shows motivation for continued treatment, pleasant and cooperative to staff and other patients on the unit.

## 2014-12-05 NOTE — Progress Notes (Signed)
  [facility] Adult Case Management Discharge Plan :  Will you be returning to the same living situation after discharge:  Yes,  will return home with husband At discharge, do you have transportation home?: Yes,  husband to provide transportation Do you have the ability to pay for your medications: Yes,  Pt provided with samples and prescriptions  Release of information consent forms completed and in the chart;  Patient's signature needed at discharge.  Patient to Follow up at: Follow-up Information    Follow up with [facility] On 12/09/2014.   Why:  between 8a-3p for your initial appointment. It is suggested that you arrive early to avoid long wait times. Please bring your insurance card, proof of income, photo ID, and social security card.   Contact information:   [address] [place], [place] [address] Phone: [phone]      Patient denies SI/HI: Yes,  Pt denies    Safety Planning and Suicide Prevention discussed: Yes,  with husband. See SPE note for further information.  Have you used any form of tobacco in the last 30 days? (Cigarettes, Smokeless Tobacco, Cigars, and/or Pipes): No  Has patient been referred to the Quitline?: N/A patient is not a smoker  Elaina Hoops 12/05/2014, 11:19 AM

## 2014-12-05 NOTE — [facility] Suicide Risk Assessment (Signed)
[facility] Discharge Suicide Risk Assessment   Demographic Factors:  59 year old married female. Lives with husband .   Total Time spent with patient: 30 minutes  Musculoskeletal: Strength & Muscle Tone: within normal limits Gait & Station: has improved- walks with cane Patient leans: N/A  Psychiatric Specialty Exam: Physical Exam  ROS  Blood pressure 114/68, pulse 83, temperature 98.4 F (36.9 C), temperature source Oral, resp. rate 16, height  (1.575 m), weight 200 lb (90.719 kg).Body mass index is 36.57 kg/(m^2).  General Appearance: Well Groomed  Eye Contact::  Good  Speech:  Normal Rate409  Volume:  Normal  Mood:  improved and seems euthymic   Affect:  Appropriate and Full Range  Thought Process:  Linear  Orientation:  Full (Time, Place, and Person)  Thought Content:  no hallucinations, no delusions  Suicidal Thoughts:  No  Homicidal Thoughts:  No  Memory:  recent and remote grossly intact   Judgement:  Other:  improved  Insight:  Present  Psychomotor Activity:  Normal  Concentration:  Good  Recall:  Good  Fund of Knowledge:Good  Language: Good  Akathisia:  Negative  Handed:  Right  AIMS (if indicated):     Assets:  Communication Skills Desire for Improvement Housing Resilience Social Support  Sleep:  Number of Hours: 4.25  Cognition: WNL  ADL's: improved    Have you used any form of tobacco in the last 30 days? (Cigarettes, Smokeless Tobacco, Cigars, and/or Pipes): No  Has this patient used any form of tobacco in the last 30 days? (Cigarettes, Smokeless Tobacco, Cigars, and/or Pipes) No  Mental Status Per Nursing Assessment::   On Admission:     Current Mental Status by Physician: At this time she is much better than upon admission- her mood is better, her affect is brighter, she is not thought disordered, no hallucinations,no delusions, she has no suicidal or homicidal thoughts and she is future oriented, looking forward to going back home to her family.    Loss Factors: Chronic back pain  Historical Factors: History of depression, had developed some auditory hallucinations- no history of suicide attempts .  Risk Reduction Factors:   Sense of responsibility to family, Living with another person, especially a relative, Positive social support and Positive coping skills or problem solving skills  Continued Clinical Symptoms:  As noted above, much improved compared to admission  Cognitive Features That Contribute To Risk:  No gross cognitive deficits noted upon discharge. Is alert , attentive, and oriented x 3   Suicide Risk:  Mild:  Suicidal ideation of limited frequency, intensity, duration, and specificity.  There are no identifiable plans, no associated intent, mild dysphoria and related symptoms, good self-control (both objective and subjective assessment), few other risk factors, and identifiable protective factors, including available and accessible social support.  Principal Problem: Major depression, recurrent, chronic Discharge Diagnoses:  Patient Active Problem List   Diagnosis Date Noted  . Major depression, recurrent, chronic [F33.9] 12/02/2014  . Mood disorder [F39] 12/01/2014  . Chronic back pain [M54.9, G89.29]   . Hyperparathyroidism, secondary renal [N25.81] 07/31/2014  . Neuromuscular disorder [G70.9] 07/31/2014  . Lower back pain [M54.5] 09/21/2013  . Diastolic heart failure [I50.30] 03/17/2012  . Syncope and collapse [R55] 02/12/2012  . Hypertension [I10] 02/04/2012  . Chronic kidney disease [N18.9] 02/04/2012  . Edema [R60.9] 02/04/2012  . Tachycardia [R00.0] 02/04/2012  . Encounter for long-term (current) use of medications [Z79.899] 02/04/2012  . Anxiety and depression [F41.8] 02/04/2012  . Hypothyroid [  E03.9] 02/04/2012  . Hyperlipidemia [E78.5] 02/04/2012    Follow-up Information    Follow up with [facility] On 12/09/2014.   Why:  between 8a-3p for your initial appointment. It is  suggested that you arrive early to avoid long wait times. Please bring your insurance card, proof of income, photo ID, and social security card.   Contact information:   [address] [place] [address] Phone: [phone]      Plan Of Care/Follow-up recommendations:  Activity:  as tolerated Diet:  Heart Healthy Tests:  NA Other:  See below   Is patient on multiple antipsychotic therapies at discharge:  No   Has Patient had three or more failed trials of antipsychotic monotherapy by history:  No  Recommended Plan for Multiple Antipsychotic Therapies: NA  Patient is leaving unit in good spirits. Husband is going to pick her up later today. Plans to return home. Has an established PCP- Dr. [name] for medical follow up as needed .  COBOS, FERNANDO 12/05/2014, 10:45 AM

## 2014-12-05 NOTE — Discharge Summary (Signed)
Physician Discharge Summary Note  Patient:  Jennifer Davidson is an 59 y.o., female MRN:  [id number] DOB:  05-Apr-1956 Patient phone:  [phone] (home)  Patient address:   [place] [address],  Total Time spent with patient: 45 minutes  Date of Admission:  12/01/2014 Date of Discharge: 12/05/2014  Reason for Admission:  depression  Principal Problem: Major depression, recurrent, chronic Discharge Diagnoses: Patient Active Problem List   Diagnosis Date Noted  . Major depression, recurrent, chronic [F33.9] 12/02/2014  . Mood disorder [F39] 12/01/2014  . Chronic back pain [M54.9, G89.29]   . Hyperparathyroidism, secondary renal [N25.81] 07/31/2014  . Neuromuscular disorder [G70.9] 07/31/2014  . Lower back pain [M54.5] 09/21/2013  . Diastolic heart failure [W92.95] 03/17/2012  . Syncope and collapse [R55] 02/12/2012  . Hypertension [I10] 02/04/2012  . Chronic kidney disease [N18.9] 02/04/2012  . Edema [R60.9] 02/04/2012  . Tachycardia [R00.0] 02/04/2012  . Encounter for long-term (current) use of medications [Z79.899] 02/04/2012  . Anxiety and depression [F41.8] 02/04/2012  . Hypothyroid [E03.9] 02/04/2012  . Hyperlipidemia [E78.5] 02/04/2012    Musculoskeletal: Strength & Muscle Tone: within normal limits Gait & Station: normal Patient leans: N/A  Psychiatric Specialty Exam:  SEE SRA Physical Exam  Vitals reviewed. Psychiatric: Her mood appears anxious.    Review of Systems  Constitutional: Negative for fever.  Eyes: Negative for blurred vision.  Respiratory: Negative for cough.   Cardiovascular: Negative for chest pain.  Gastrointestinal: Negative for heartburn.  Genitourinary: Negative for dysuria.  Musculoskeletal: Negative for myalgias.  Skin: Negative for rash.  Neurological: Negative for dizziness and headaches.  Psychiatric/Behavioral: Negative for depression.    Blood pressure 114/68, pulse 83, temperature 98.4 F (36.9 C), temperature source  Oral, resp. rate 16, height 5' 2" (1.575 m), weight 90.719 kg (200 lb).Body mass index is 36.57 kg/(m^2).  Have you used any form of tobacco in the last 30 days? (Cigarettes, Smokeless Tobacco, Cigars, and/or Pipes): No  Has this patient used any form of tobacco in the last 30 days? (Cigarettes, Smokeless Tobacco, Cigars, and/or Pipes) N/A  Past Medical History:  Past Medical History  Diagnosis Date  . Hypertension   . Cholesterol serum increased   . Thyroid disease   . Sleep apnea   . Migraine   . Depression   . Neuromuscular disorder   . GERD (gastroesophageal reflux disease)   . Anxiety   . Chronic back pain     Past Surgical History  Procedure Laterality Date  . Abdominal hysterectomy    . Back surgery    . Appendectomy     Family History:  Family History  Problem Relation Age of Onset  . Diabetes Mother   . Heart disease Mother   . Kidney disease Mother   . Thyroid disease Mother   . Heart disease Father   . Seizures Father   . COPD Father   . Cancer Maternal Grandmother    Social History:  History  Alcohol Use No     History  Drug Use No    History   Social History  . Marital Status: Married    Spouse Name: N/A  . Number of Children: N/A  . Years of Education: N/A   Social History Main Topics  . Smoking status: Never Smoker   . Smokeless tobacco: Never Used  . Alcohol Use: No  . Drug Use: No  . Sexual Activity:    Partners: Male     Comment: married   Other  Topics Concern  . None   Social History Narrative   Risk to Self: Is patient at risk for suicide?: Yes What has been your use of drugs/alcohol within the last 12 months?: Pt denies Risk to Others:   Prior Inpatient Therapy:   Prior Outpatient Therapy:    Level of Care:  OP  Hospital Course:  Jennifer Davidson, 59 year old married female. Reports worsening depression, which she attributes partly to chronic pain, which causes her difficulty mobilizing .  States she has had chronic depression.  Over recent weeks she has been having auditory hallucinationsShe has recently had suicidal ideations, although mostly passive thoughts about " rather being dead". She did consider overdosing on her medications.  Jennifer Davidson was admitted for Major depression, recurrent, chronic and crisis management.  She was treated discharged with the medications listed below under Medication List.  Medical problems were identified and treated as needed.  Home medications were restarted as appropriate.  Improvement was monitored by observation and Jennifer Davidson of symptom reduction.  Emotional and mental status was monitored by daily self-inventory reports completed by Jennifer Davidson.         Jennifer Davidson was evaluated by the treatment team for stability and plans for continued recovery upon discharge.  Jennifer Davidson motivation was an integral factor for scheduling further treatment.  Employment, transportation, bed availability, health status, family support, and any pending legal issues were also considered during /her hospital stay.  She was offered further treatment options upon discharge including but not limited to Residential, Intensive Outpatient, and Outpatient treatment.  Jennifer Davidson will follow up with the services as listed below under Follow Up Information.     Upon completion of this admission the patient was both mentally and medically stable for discharge denying suicidal/homicidal ideation, auditory/visual/tactile hallucinations, delusional thoughts and paranoia.      Consults:  psychiatry  Significant Diagnostic Studies:  labs: per ED  Discharge Vitals:   Blood pressure 114/68, pulse 83, temperature 98.4 F (36.9 C), temperature source Oral, resp. rate 16, height 5' 2" (1.575 m), weight 90.719 kg (200 lb). Body mass index is 36.57 kg/(m^2). Lab Results:   Results for orders placed or performed during the hospital encounter of 12/01/14 (from the past 72 hour(s))   Basic metabolic panel     Status: Abnormal   Collection Time: 12/03/14  6:10 AM  Result Value Ref Range   Sodium 142 135 - 145 mmol/L   Potassium 4.5 3.5 - 5.1 mmol/L   Chloride 107 101 - 111 mmol/L   CO2 27 22 - 32 mmol/L   Glucose, Bld 118 (H) 65 - 99 mg/dL   BUN 19 6 - 20 mg/dL   Creatinine, Ser 1.76 (H) 0.44 - 1.00 mg/dL   Calcium 9.7 8.9 - 10.3 mg/dL   GFR calc non Af Amer 30 (L) >60 mL/min   GFR calc Af Amer 35 (L) >60 mL/min    Comment: (NOTE) The eGFR has been calculated using the CKD EPI equation. This calculation has not been validated in all clinical situations. eGFR's persistently <60 mL/min signify possible Chronic Kidney Disease.    Anion gap 8 5 - 15    Comment: Performed at [facility]  Urine culture     Status: None   Collection Time: 12/03/14  6:48 AM  Result Value Ref Range   Specimen Description      URINE, RANDOM Performed at [facility]  [facility]    Special Requests      NONE Performed at [place], SUGGEST RECOLLECTION IF CLINICALLY INDICATED Performed at [facility]    Davidson Status 12/04/2014 FINAL   Urinalysis, Routine w reflex microscopic (not at [facility])     Status: Abnormal   Collection Time: 12/03/14  6:48 AM  Result Value Ref Range   Color, Urine YELLOW YELLOW   APPearance CLEAR CLEAR   Specific Gravity, Urine 1.012 1.005 - 1.030   pH 7.0 5.0 - 8.0   Glucose, UA NEGATIVE NEGATIVE mg/dL   Hgb urine dipstick NEGATIVE NEGATIVE   Bilirubin Urine NEGATIVE NEGATIVE   Ketones, ur NEGATIVE NEGATIVE mg/dL   Protein, ur NEGATIVE NEGATIVE mg/dL   Urobilinogen, UA 1.0 0.0 - 1.0 mg/dL   Nitrite NEGATIVE NEGATIVE   Leukocytes, UA SMALL (A) NEGATIVE    Comment: Performed at [facility]  Creatinine, urine, random     Status: None   Collection Time: 12/03/14  6:48 AM  Result Value Ref Range   Creatinine, Urine 76.82 mg/dL     Comment: Performed at [facility]  Sodium, urine, random     Status: None   Collection Time: 12/03/14  6:48 AM  Result Value Ref Range   Sodium, Ur 105 mmol/L    Comment: Performed at [facility]  Urine microscopic-add on     Status: Abnormal   Collection Time: 12/03/14  6:48 AM  Result Value Ref Range   Squamous Epithelial / LPF RARE RARE   WBC, UA 3-6 <3 WBC/hpf   RBC / HPF 0-2 <3 RBC/hpf   Bacteria, UA RARE RARE   Casts HYALINE CASTS (A) NEGATIVE    Comment: Performed at [facility]    Physical Findings: AIMS: Facial and Oral Movements Muscles of Facial Expression: None, normal Lips and Perioral Area: None, normal Jaw: None, normal Tongue: None, normal,Extremity Movements Upper (arms, wrists, hands, fingers): None, normal Lower (legs, knees, ankles, toes): None, normal, Trunk Movements Neck, shoulders, hips: None, normal, Overall Severity Severity of abnormal movements (highest score from questions above): None, normal Incapacitation due to abnormal movements: None, normal Patient's awareness of abnormal movements (rate only patient's Davidson): No Awareness, Dental Status Current problems with teeth and/or dentures?: No Does patient usually wear dentures?: No  CIWA:    COWS:      See Psychiatric Specialty Exam and Suicide Risk Assessment completed by Attending Physician prior to discharge.  Discharge destination:  Home  Is patient on multiple antipsychotic therapies at discharge:  No   Has Patient had three or more failed trials of antipsychotic monotherapy by history:  No    Recommended Plan for Multiple Antipsychotic Therapies: NA     Medication List    STOP taking these medications        buPROPion 150 MG 24 hr tablet  Commonly known as:  WELLBUTRIN XL     calcium carbonate 600 MG Tabs tablet  Commonly known as:  OS-CAL     citalopram 40 MG tablet  Commonly known as:  CELEXA     clonazePAM 0.5 MG tablet  Commonly  known as:  KLONOPIN     CRANBERRY PO     ESTROVEN PO     Fish Oil 1200 MG Caps     multivitamin with minerals Tabs tablet     oxyCODONE-acetaminophen 7.5-325 MG per tablet  Commonly  known as:  PERCOCET     traZODone 50 MG tablet  Commonly known as:  DESYREL     VITAMIN C PO     VITAMIN E PO     zoster vaccine live (PF) 19400 UNT/0.65ML injection  Commonly known as:  ZOSTAVAX      TAKE these medications      Indication   cephALEXin 500 MG capsule  Commonly known as:  KEFLEX  Take 1 capsule (500 mg total) by mouth every 12 (twelve) hours.   Indication:  Infection of the Skin and Skin Structures     cholecalciferol 1000 UNITS tablet  Commonly known as:  VITAMIN D  Take 1 tablet (1,000 Units total) by mouth daily at 12 noon.   Indication:  Vitamin D Deficiency     DULoxetine HCl 40 MG Cpep  Take 40 mg by mouth daily.   Indication:  Major Depressive Disorder     esomeprazole 40 MG capsule  Commonly known as:  NEXIUM  Take 1 capsule (40 mg total) by mouth daily.   Indication:  Reflux disease     furosemide 40 MG tablet  Commonly known as:  LASIX  Take 1 tablet (40 mg total) by mouth daily.   Indication:  High Blood Pressure     gabapentin 300 MG capsule  Commonly known as:  NEURONTIN  Take 2 capsules (600 mg total) by mouth 2 (two) times daily.   Indication:  Agitation, Neuropathic Pain     hydrOXYzine 25 MG tablet  Commonly known as:  ATARAX/VISTARIL  Take 1 tablet (25 mg total) by mouth 3 (three) times daily as needed for anxiety (sleep).   Indication:  Anxiety Neurosis, Pain     levothyroxine 50 MCG tablet  Commonly known as:  SYNTHROID, LEVOTHROID  TAKE 1 TABLET (50 MCG TOTAL) BY MOUTH DAILY.   Indication:  Underactive Thyroid     lidocaine 5 %  Commonly known as:  LIDODERM  Place 1 patch onto the skin daily. Remove & Discard patch within 12 hours or as directed by MD   Indication:  Allodynia     potassium chloride SA 20 MEQ tablet  Commonly known  as:  KLOR-CON M20  Take 1 tablet (20 mEq total) by mouth 2 (two) times daily.   Indication:  Low Amount of Potassium in the Blood     SUMAtriptan 50 MG tablet  Commonly known as:  IMITREX  Take 1 tablet (50 mg total) by mouth every 2 (two) hours as needed for migraine.   Indication:  Migraine Headache     topiramate 100 MG tablet  Commonly known as:  TOPAMAX  Take 1 tablet (100 mg total) by mouth at bedtime.   Indication:  Migraine Headache           Follow-up Information    Follow up with [facility] On 12/09/2014.   Why:  between 8a-3p for your initial appointment. It is suggested that you arrive early to avoid long wait times. Please bring your insurance card, proof of income, photo ID, and social security card.   Contact information:   [address] [place] [address] Phone: [phone]      Follow-up recommendations:  Activity:  as tol, diet as tol  Comments:  1.  Take all your medications as prescribed.              2.  Davidson any adverse side effects to outpatient provider.  3.  Patient instructed to not use alcohol or illegal drugs while on prescription medicines.            4.  In the event of worsening symptoms, instructed patient to call 911, the crisis hotline or go to nearest emergency room for evaluation of symptoms.  Total Discharge Time:  40 min  Signed: Freda Munro May Agustin AGNP-BC 12/05/2014, 10:21 AM   Patient seen, Suicide Assessment Completed.  Disposition Plan Reviewed

## 2014-12-05 NOTE — Progress Notes (Signed)
D/C instructions/meds/follow-up appointments reviewed, pt verbalized understanding, pt's belongings returned to pt, samples given. 

## 2014-12-06 ENCOUNTER — Encounter: Payer: Self-pay | Admitting: Gastroenterology

## 2014-12-10 ENCOUNTER — Ambulatory Visit: Payer: Self-pay | Admitting: Nurse Practitioner

## 2014-12-15 ENCOUNTER — Ambulatory Visit (INDEPENDENT_AMBULATORY_CARE_PROVIDER_SITE_OTHER): Payer: Managed Care, Other (non HMO) | Admitting: Family Medicine

## 2014-12-15 VITALS — BP 128/70 | HR 105 | Temp 98.3°F | Resp 18 | Ht 62.0 in | Wt 194.0 lb

## 2014-12-15 DIAGNOSIS — M549 Dorsalgia, unspecified: Secondary | ICD-10-CM

## 2014-12-15 DIAGNOSIS — N183 Chronic kidney disease, stage 3 (moderate): Secondary | ICD-10-CM | POA: Diagnosis not present

## 2014-12-15 DIAGNOSIS — R7303 Prediabetes: Secondary | ICD-10-CM

## 2014-12-15 DIAGNOSIS — R609 Edema, unspecified: Secondary | ICD-10-CM

## 2014-12-15 DIAGNOSIS — Z79899 Other long term (current) drug therapy: Secondary | ICD-10-CM | POA: Diagnosis not present

## 2014-12-15 DIAGNOSIS — N390 Urinary tract infection, site not specified: Secondary | ICD-10-CM | POA: Diagnosis not present

## 2014-12-15 DIAGNOSIS — R7309 Other abnormal glucose: Secondary | ICD-10-CM | POA: Diagnosis not present

## 2014-12-15 DIAGNOSIS — G8929 Other chronic pain: Secondary | ICD-10-CM

## 2014-12-15 DIAGNOSIS — R350 Frequency of micturition: Secondary | ICD-10-CM

## 2014-12-15 DIAGNOSIS — F339 Major depressive disorder, recurrent, unspecified: Secondary | ICD-10-CM | POA: Diagnosis not present

## 2014-12-15 LAB — POCT UA - MICROSCOPIC ONLY
CRYSTALS, UR, HPF, POC: NEGATIVE
Casts, Ur, LPF, POC: NEGATIVE
MUCUS UA: NEGATIVE
RBC, URINE, MICROSCOPIC: NEGATIVE
Yeast, UA: NEGATIVE

## 2014-12-15 LAB — COMPREHENSIVE METABOLIC PANEL
ALT: 24 U/L (ref 6–29)
AST: 21 U/L (ref 10–35)
Albumin: 4.6 g/dL (ref 3.6–5.1)
Alkaline Phosphatase: 60 U/L (ref 33–130)
BUN: 18 mg/dL (ref 7–25)
CO2: 26 mmol/L (ref 20–31)
CREATININE: 1.5 mg/dL — AB (ref 0.50–0.99)
Calcium: 10 mg/dL (ref 8.6–10.4)
Chloride: 104 mmol/L (ref 98–110)
GLUCOSE: 79 mg/dL (ref 65–99)
Potassium: 4.1 mmol/L (ref 3.5–5.3)
Sodium: 139 mmol/L (ref 135–146)
TOTAL PROTEIN: 7.9 g/dL (ref 6.1–8.1)
Total Bilirubin: 0.5 mg/dL (ref 0.2–1.2)

## 2014-12-15 LAB — POCT URINALYSIS DIPSTICK
BILIRUBIN UA: NEGATIVE
Blood, UA: NEGATIVE
Glucose, UA: NEGATIVE
Ketones, UA: NEGATIVE
NITRITE UA: NEGATIVE
PH UA: 7
Protein, UA: NEGATIVE
Spec Grav, UA: 1.015
Urobilinogen, UA: 1

## 2014-12-15 MED ORDER — GABAPENTIN 300 MG PO CAPS: 600.0000 mg | ORAL_CAPSULE | Freq: Two times a day (BID) | ORAL | Status: DC

## 2014-12-15 MED ORDER — HYDROXYZINE HCL 25 MG PO TABS: 25.0000 mg | ORAL_TABLET | Freq: Three times a day (TID) | ORAL | Status: DC | PRN

## 2014-12-15 MED ORDER — LIDOCAINE 5 % EX PTCH: 1.0000 | MEDICATED_PATCH | Freq: Every day | CUTANEOUS | Status: DC

## 2014-12-15 MED ORDER — DULOXETINE HCL 40 MG PO CPEP: 40.0000 mg | ORAL_CAPSULE | Freq: Every day | ORAL | Status: DC

## 2014-12-15 NOTE — Patient Instructions (Addendum)
Yes, fine to continue [place] - we will want to make sure we keep on top of your mammograms but this is something that you do anyway - make sure it has melatonin in it - if not, add in some extra melatonin.  Call the behavioral health in [place] and see if they have list of local providers that could get you.   If not I like:    [facility]  Address: [address], [place], [place] [address]  [phone]   [facility] [address] [place], [place] [address]  Phone: [phone]   [facility] [place] [address], [place], [place] [address]  Phone: [phone]

## 2014-12-15 NOTE — Progress Notes (Addendum)
Subjective:    Patient ID: Jennifer Davidson, female    DOB: 05-01-1956, 59 y.o.   MRN: [id number] This chart was scribed for Norberto Sorenson, MD by Jolene Provost, Medical Scribe. This patient was seen in Room 11 and the patient's care was started a 10:36 AM.  Chief Complaint  Patient presents with  . Follow-up  . Medication Refill    hydroxyzine,duloxetine, and lidocaine patch    HPI HPI Comments: Jennifer Davidson is a 59 y.o. female with a past surgical hx of abdominal hysterectomy who presents to [facility] reporting for a follow up visit after hospitalization for major depressive disorder. She was hospitalized 07/17-21 for recurrent chronic major depression. Pt states she is doing much better. Pt states her back pain is much improved, and she is off pain medication. She has not having GERD sx, which is why she cancelled her GI appointment. Pt states she was not pleased with [facility], so she needs a recommendation for a therapist. She waited for six hours at [facility] and was then put in an anxiety group which was not a good fit for her. While she is was hospitalized her gabapentin was increased to 300mg  (150mg , twice per day), which has seemed to work well. Pt states she has been having some mildly blurred vision which she associates with her hydroxizine. She has had some migraines, and has taken imitrex intermittently, which has been effective.   The pt also states she is not sleeping well, and is taking as needed estroven at night which is a black cohosh, soy and melatonin supplement.    Past Medical History  Diagnosis Date  . Hypertension   . Cholesterol serum increased   . Hypothyroidism   . Sleep apnea   . Migraine   . Depression   . Neuromuscular disorder   . GERD (gastroesophageal reflux disease)   . Anxiety   . Chronic back pain   . Chronic kidney disease   . Tachycardia   . Hyperparathyroidism   . Diastolic heart failure   No Known Allergies  Current Outpatient Prescriptions on File Prior to  Visit  Medication Sig Dispense Refill  . cholecalciferol (VITAMIN D) 1000 UNITS tablet Take 1 tablet (1,000 Units total) by mouth daily at 12 noon. 30 tablet 0  . DULoxetine 40 MG CPEP Take 40 mg by mouth daily. 30 capsule 0  . esomeprazole (NEXIUM) 40 MG capsule Take 1 capsule (40 mg total) by mouth daily. 30 capsule 3  . furosemide (LASIX) 40 MG tablet Take 1 tablet (40 mg total) by mouth daily. 90 tablet 1  . gabapentin (NEURONTIN) 300 MG capsule Take 2 capsules (600 mg total) by mouth 2 (two) times daily. 120 capsule 0  . hydrOXYzine (ATARAX/VISTARIL) 25 MG tablet Take 1 tablet (25 mg total) by mouth 3 (three) times daily as needed for anxiety (sleep). 30 tablet 0  . levothyroxine (SYNTHROID, LEVOTHROID) 50 MCG tablet TAKE 1 TABLET (50 MCG TOTAL) BY MOUTH DAILY. 90 tablet 3  . lidocaine (LIDODERM) 5 % Place 1 patch onto the skin daily. Remove & Discard patch within 12 hours or as directed by MD 30 patch 0  . potassium chloride SA (KLOR-CON M20) 20 MEQ tablet Take 1 tablet (20 mEq total) by mouth 2 (two) times daily. 180 tablet 1  . SUMAtriptan (IMITREX) 50 MG tablet Take 1 tablet (50 mg total) by mouth every 2 (two) hours as needed for migraine. 10 tablet 3  . topiramate (TOPAMAX) 100 MG tablet  Take 1 tablet (100 mg total) by mouth at bedtime. 90 tablet 3  . cephALEXin (KEFLEX) 500 MG capsule Take 1 capsule (500 mg total) by mouth every 12 (twelve) hours. (Patient not taking: Reported on 12/15/2014)     No current facility-administered medications on file prior to visit.   Depression screen [facility] 2/9 12/15/2014 11/08/2014 10/26/2013  Decreased Interest 0 0 0  Down, Depressed, Hopeless 0 (No Data) 1  PHQ - 2 Score 0 0 1      Review of Systems  Constitutional: Positive for activity change. Negative for fever, chills, diaphoresis, appetite change, fatigue and unexpected weight change.  HENT: Negative for trouble swallowing.   Eyes: Positive for visual disturbance.  Cardiovascular: Positive  for leg swelling.  Gastrointestinal: Negative for vomiting and constipation.  Genitourinary: Negative for dysuria, urgency and frequency.  Musculoskeletal: Positive for myalgias, back pain, joint swelling, arthralgias and gait problem. Negative for neck pain.  Neurological: Positive for weakness and headaches. Negative for dizziness, syncope and light-headedness.  Psychiatric/Behavioral: Positive for sleep disturbance. Negative for suicidal ideas, hallucinations, confusion, self-injury, dysphoric mood and decreased concentration. The patient is not nervous/anxious.        Objective:  BP 128/70 mmHg  Pulse 105  Temp(Src) 98.3 F (36.8 C) (Oral)  Resp 18  Ht 5\' 2"  (1.575 m)  Wt 194 lb (87.998 kg)  BMI 35.47 kg/m2  SpO2 98%  Physical Exam  Constitutional: She is oriented to person, place, and time. She appears well-developed and well-nourished. No distress.  HENT:  Head: Normocephalic and atraumatic.  Eyes: Pupils are equal, round, and reactive to light.  Neck: Neck supple.  Cardiovascular: Normal rate, regular rhythm, normal heart sounds and intact distal pulses.   No murmur heard. 2+ pedal pulses, no lower extremity edema  Pulmonary/Chest: Effort normal. No respiratory distress. She has no wheezes.  Musculoskeletal: Normal range of motion. She exhibits no edema.  Neurological: She is alert and oriented to person, place, and time. Coordination normal.  Skin: Skin is warm and dry. She is not diaphoretic.  Psychiatric: She has a normal mood and affect. Her behavior is normal.  Nursing note and vitals reviewed.     Results for orders placed or performed in visit on 12/15/14  POCT UA - Microscopic Only  Result Value Ref Range   WBC, Ur, HPF, POC 1-3    RBC, urine, microscopic neg    Bacteria, U Microscopic 2+    Mucus, UA neg    Epithelial cells, urine per micros 14-16    Crystals, Ur, HPF, POC neg    Casts, Ur, LPF, POC neg    Yeast, UA neg   POCT urinalysis dipstick    Result Value Ref Range   Color, UA yellow    Clarity, UA clear    Glucose, UA neg    Bilirubin, UA neg    Ketones, UA neg    Spec Grav, UA 1.015    Blood, UA neg    pH, UA 7.0    Protein, UA neg    Urobilinogen, UA 1.0    Nitrite, UA neg    Leukocytes, UA Trace (A) Negative    Assessment & Plan:   1. Major depression, recurrent, chronic - taken off citalopram and wellbutrin, started on cymbalta and doing HUGELY better, does need to follow up w/ therapist/psychologist to process childhood traumas and chronic pain - contact info given  2. Recurrent UTI - completed keflex sev d ago - nml  3. Urinary frequency  4. Chronic renal disease, stage 3 (moderate) - has been stable, no nsaids, seens CKA q6 mos.  5. Polypharmacy   6. Edema - on lasix w/ K qd.  7. Chronic back pain - doing MUCH better off pain meds - does not want to go back on as balance and thinking so much better.  On cymbalta 40 -> cons increasing to 60 in a month if needed. Cont top lidoderm patch.  Wonder if pt would respond to compound cream if needed for additional pain control in future.  Cont neurontin  8. Prediabetes   9.      Insomnia - resistent to meds, taken of trazodone, cont melatonin qhs 10.    Anxiety - doing much better off klonopin and onto prn hydroxyzine which she is using tid - reviewed side effects.  Orders Placed This Encounter  Procedures  . Comprehensive metabolic panel  . POCT UA - Microscopic Only  . POCT urinalysis dipstick    Meds ordered this encounter  Medications  . lidocaine (LIDODERM) 5 %    Sig: Place 1 patch onto the skin daily. Remove & Discard patch within 12 hours or as directed by MD    Dispense:  30 patch    Refill:  11  . DISCONTD: hydrOXYzine (ATARAX/VISTARIL) 25 MG tablet    Sig: Take 1 tablet (25 mg total) by mouth 3 (three) times daily as needed for anxiety (sleep).    Dispense:  90 tablet    Refill:  3  . gabapentin (NEURONTIN) 300 MG capsule    Sig: Take 2 capsules  (600 mg total) by mouth 2 (two) times daily.    Dispense:  120 capsule    Refill:  5  . DULoxetine HCl 40 MG CPEP    Sig: Take 40 mg by mouth daily.    Dispense:  30 capsule    Refill:  0  . hydrOXYzine (ATARAX/VISTARIL) 25 MG tablet    Sig: Take 1 tablet (25 mg total) by mouth 3 (three) times daily as needed for anxiety (sleep).    Dispense:  90 tablet    Refill:  3    I personally performed the services described in this documentation, which was scribed in my presence. The recorded information has been reviewed and considered, and addended by me as needed.  Norberto Sorenson, MD MPH

## 2014-12-30 ENCOUNTER — Telehealth (HOSPITAL_COMMUNITY): Payer: Self-pay | Admitting: *Deleted

## 2015-01-01 ENCOUNTER — Other Ambulatory Visit: Payer: Self-pay | Admitting: Family Medicine

## 2015-01-17 ENCOUNTER — Ambulatory Visit (INDEPENDENT_AMBULATORY_CARE_PROVIDER_SITE_OTHER): Payer: Managed Care, Other (non HMO) | Admitting: Family Medicine

## 2015-01-17 VITALS — BP 110/70 | HR 86 | Temp 97.9°F | Resp 16 | Ht 62.0 in | Wt 193.0 lb

## 2015-01-17 DIAGNOSIS — G43909 Migraine, unspecified, not intractable, without status migrainosus: Secondary | ICD-10-CM | POA: Diagnosis not present

## 2015-01-17 DIAGNOSIS — E039 Hypothyroidism, unspecified: Secondary | ICD-10-CM

## 2015-01-17 DIAGNOSIS — G4700 Insomnia, unspecified: Secondary | ICD-10-CM | POA: Diagnosis not present

## 2015-01-17 DIAGNOSIS — I10 Essential (primary) hypertension: Secondary | ICD-10-CM | POA: Diagnosis not present

## 2015-01-17 DIAGNOSIS — M549 Dorsalgia, unspecified: Secondary | ICD-10-CM

## 2015-01-17 DIAGNOSIS — G8929 Other chronic pain: Secondary | ICD-10-CM | POA: Diagnosis not present

## 2015-01-17 DIAGNOSIS — Z79899 Other long term (current) drug therapy: Secondary | ICD-10-CM | POA: Diagnosis not present

## 2015-01-17 DIAGNOSIS — F333 Major depressive disorder, recurrent, severe with psychotic symptoms: Secondary | ICD-10-CM

## 2015-01-17 DIAGNOSIS — F411 Generalized anxiety disorder: Secondary | ICD-10-CM | POA: Diagnosis not present

## 2015-01-17 DIAGNOSIS — N183 Chronic kidney disease, stage 3 unspecified: Secondary | ICD-10-CM

## 2015-01-17 DIAGNOSIS — E785 Hyperlipidemia, unspecified: Secondary | ICD-10-CM | POA: Diagnosis not present

## 2015-01-17 MED ORDER — DULOXETINE HCL 60 MG PO CPEP: 60.0000 mg | ORAL_CAPSULE | Freq: Every day | ORAL | Status: DC

## 2015-01-17 MED ORDER — BUTALBITAL-APAP-CAFFEINE 50-325-40 MG PO TABS
1.0000 | ORAL_TABLET | Freq: Four times a day (QID) | ORAL | Status: DC | PRN
Start: 2015-01-17 — End: 2015-04-14

## 2015-01-17 MED ORDER — RIZATRIPTAN BENZOATE 10 MG PO TABS: 5.0000 mg | ORAL_TABLET | ORAL | Status: DC | PRN

## 2015-01-17 MED ORDER — TOPIRAMATE 100 MG PO TABS: ORAL_TABLET | ORAL | Status: DC

## 2015-01-17 MED ORDER — HYDROXYZINE HCL 50 MG PO TABS: 50.0000 mg | ORAL_TABLET | Freq: Four times a day (QID) | ORAL | Status: DC | PRN

## 2015-01-17 NOTE — Progress Notes (Signed)
Subjective:  This chart was scribed for Norberto Sorenson, MD by Broadus John, Medical Scribe. This patient was seen in Room 5 and the patient's care was started at 8:59 AM.   Patient ID: Jennifer Davidson, female    DOB: 1955/06/05, 59 y.o.   MRN: [id number]  Chief Complaint  Patient presents with  . Follow-up    Depression   HPI HPI Comments: Jennifer Davidson is a 59 y.o. female with a medical history of HTN, depression, anxiety, edema, and lower back pain who presents to [facility] for a follow up regarding her depression. I saw the pt a month ago, she was hospitalized with major depression with SI in plan. They took her off most of her medications including the multiple forms of pain med she was on chromic back injury sustained about 5 years ago. She reported doing great. Mood has improved substantially, her ability to think rationally and make decisions has received a marked improvement after cessation pain medication, and pain has also improved once off of them. Pt was recommended to follow up with psych but they did not schedule her an appointment, but instead [facility], which was a bad experience. She is a pt who is very well known to me, and has not had any history of addiction or dependence. She does have a history of depression, bur current exacerbation was directly tied to loss of independence from her back injury. Therefore I agreed to prescribe pt with psychiatric medicine and she was advised to establish with therapist. During her hospitalization, she was put on Cymbalta after stopping Wellbutrin and citalopram. On Cymbalta 40 and anticipated to increase to 60 and using topical lydican patch as well as Neurontin. Seen Neuro surgeon and pain management. Off of clonopin and trazodone, and started on hydroxyzine and melatonin with good effect. She has lost several pounds  Today, pt reports that she has been doing well. She indicates that she has  good days and bad days. Pt notes that she finds some relief with the lydican patch. She also reports that her anxiety is severe at times, however she tries to manage it. She indicates that she tried to keep up with exercising.  Pt indicates that her headaches have been very severe. She states that she gets them about 9 days in a month period. Pt reports that she has been on the Topamax for a long time.  Pt states that she is not experiencing any legs symptoms.  She also indicates that she keeps hydrated and denies any urine symptoms. Pt states that she still presents with sleep disturbance at night time, she is not compliant with taking her melatonin medication.   Pt reports that she is seeing Dr. Vickii Chafe at [facility].   Patient Active Problem List   Diagnosis Date Noted  . Severe recurrent major depressive disorder with psychotic features   . Major depression, recurrent, chronic 12/02/2014  . Mood disorder 12/01/2014  . Chronic back pain   . Hyperparathyroidism, secondary renal 07/31/2014  . Neuromuscular disorder 07/31/2014  . Lower back pain 09/21/2013  . Diastolic heart failure 03/17/2012  . Syncope and collapse 02/12/2012  . Hypertension 02/04/2012  . Chronic kidney disease 02/04/2012  . Edema 02/04/2012  . Tachycardia 02/04/2012  . Encounter for long-term (current) use of medications 02/04/2012  . Anxiety and depression 02/04/2012  . Hypothyroid 02/04/2012  . Hyperlipidemia 02/04/2012   Past Medical History  Diagnosis Date  . Hypertension   . Cholesterol serum increased   . Hypothyroidism   . Sleep apnea   . Migraine   . Depression   . Neuromuscular disorder   . GERD (gastroesophageal reflux disease)   . Anxiety   . Chronic back pain   . Chronic kidney disease   . Tachycardia   . Hyperparathyroidism   . Diastolic heart failure    Past Surgical History  Procedure Laterality Date  . Abdominal hysterectomy    . Back surgery    . Appendectomy     No  Known Allergies Prior to Admission medications   Medication Sig Start Date End Date Taking? Authorizing Provider  cholecalciferol (VITAMIN D) 1000 UNITS tablet Take 1 tablet (1,000 Units total) by mouth daily at 12 noon. 12/05/14   Adonis Brook, NP  DULoxetine HCl 40 MG CPEP Take 40 mg by mouth daily. 12/15/14   [name], MD  esomeprazole (NEXIUM) 40 MG capsule Take 1 capsule (40 mg total) by mouth daily. 12/05/14   Adonis Brook, NP  furosemide (LASIX) 40 MG tablet Take 1 tablet (40 mg total) by mouth daily. 12/05/14   Adonis Brook, NP  gabapentin (NEURONTIN) 300 MG capsule Take 2 capsules (600 mg total) by mouth 2 (two) times daily. 12/15/14   [name], MD  hydrOXYzine (ATARAX/VISTARIL) 25 MG tablet Take 1 tablet (25 mg total) by mouth 3 (three) times daily as needed for anxiety (sleep). 12/15/14   [name], MD  levothyroxine (SYNTHROID, LEVOTHROID) 50 MCG tablet TAKE 1 TABLET (50 MCG TOTAL) BY MOUTH DAILY. 12/05/14   Adonis Brook, NP  lidocaine (LIDODERM) 5 % Place 1 patch onto the skin daily. Remove & Discard patch within 12 hours or as directed by MD 12/15/14   [name], MD  potassium chloride SA (KLOR-CON M20) 20 MEQ tablet Take 1 tablet (20 mEq total) by mouth 2 (two) times daily. 12/05/14   Adonis Brook, NP  SUMAtriptan (IMITREX) 50 MG tablet Take 1 tablet (50 mg total) by mouth every 2 (two) hours as needed for migraine. 12/05/14   Adonis Brook, NP  topiramate (TOPAMAX) 100 MG tablet Take 1 tablet (100 mg total) by mouth at bedtime. 12/05/14   Adonis Brook, NP   Social History   Social History  . Marital Status: Married    Spouse Name: N/A  . Number of Children: N/A  . Years of Education: N/A   Occupational History  . Not on file.   Social History Main Topics  . Smoking status: Never Smoker   . Smokeless tobacco: Never Used  . Alcohol Use: No  . Drug Use: No  . Sexual Activity:    Partners: Male     Comment: married   Other Topics Concern  . Not on file    Social History Narrative    Review of Systems  Constitutional: Positive for activity change and fatigue. Negative for appetite change and unexpected weight change.  Cardiovascular: Negative for leg swelling.  Endocrine: Negative for polyuria.  Genitourinary: Negative for dysuria, urgency and frequency.  Musculoskeletal: Positive for myalgias, back pain, arthralgias and gait problem.  Neurological: Positive for weakness and headaches. Negative for dizziness and light-headedness.  Psychiatric/Behavioral: Positive for sleep disturbance and dysphoric mood. Negative for hallucinations and confusion. The patient is nervous/anxious. The patient is not hyperactive.       Objective:   Physical Exam  Constitutional: She is oriented to person, place, and time. She appears well-developed  and well-nourished. No distress.  HENT:  Head: Normocephalic and atraumatic.  Eyes: EOM are normal. Pupils are equal, round, and reactive to light.  Neck: Neck supple.  Cardiovascular: Normal rate.   Pulses:      Dorsalis pedis pulses are 2+ on the right side, and 2+ on the left side.       Posterior tibial pulses are 2+ on the right side, and 2+ on the left side.  Pulmonary/Chest: Effort normal and breath sounds normal. No respiratory distress. She has no wheezes. She has no rales.  Musculoskeletal: She exhibits no edema (on the lower extremities ).  Neurological: She is alert and oriented to person, place, and time. No cranial nerve deficit.  Skin: Skin is warm and dry.  Psychiatric: She has a normal mood and affect. Her behavior is normal.  Nursing note and vitals reviewed.  BP 110/70 mmHg  Pulse 86  Temp(Src) 97.9 F (36.6 C) (Oral)  Resp 16  Ht 5\' 2"  (1.575 m)  Wt 193 lb (87.544 kg)  BMI 35.29 kg/m2  SpO2 98%     Assessment & Plan:   1. Chronic kidney disease, stage 3 (moderate) - stabl  2. Encounter for long-term (current) use of medications   3. Severe recurrent major depressive disorder  with psychotic features - increase cymbalta from 40 to 60   4. Hyperlipidemia   5. Chronic back pain - chronic - followed by pain management and neurosurg, cont lidoderm - helping some, increase cymbalta  6. Hypothyroidism, unspecified hypothyroidism type - tsh stable for sev yrs, cont 125 mcg levothyroxine, call for refill when needed  7. Essential hypertension - well controlled, cont current regimen  8. Anxiety state - worsening - increase prn hydroxyzine as helping some w/ no sig side effects. Cons trial of buspar if sxs persist  9. Insomnia - restart melatonin - pt w/o any desire to resart bzd or trazodone. If sxs worsen may cons qhs tca  10.    Migraine - worsening so increase topamax by starting 50mg  qam and cont 100 qhs.  imitrex minimally helping - she has never tried any other triptan so switch to maxalt in hopes of more response.  Pt rec to call insurance to see which other triptans have insurance coverage.  May want to try nasal or sq imitrex if maxalt is not helping.  Try prn Fioricet.     Pt's husband is also a pt of mine - he is going to have his hip replacement in several weeks and will be out of work for 6 wks folowing  Meds ordered this encounter  Medications  . topiramate (TOPAMAX) 100 MG tablet    Sig: Take 1/2 tab qam and 1 tab qhs    Dispense:  30 tablet    Refill:  0  . rizatriptan (MAXALT) 10 MG tablet    Sig: Take 0.5 tablets (5 mg total) by mouth as needed for migraine. May repeat in 2 hours if needed    Dispense:  10 tablet    Refill:  5  . butalbital-acetaminophen-caffeine (FIORICET) 50-325-40 MG per tablet    Sig: Take 1-2 tablets by mouth every 6 (six) hours as needed for headache.    Dispense:  20 tablet    Refill:  0  . hydrOXYzine (ATARAX/VISTARIL) 50 MG tablet    Sig: Take 1 tablet (50 mg total) by mouth every 6 (six) hours as needed for anxiety (sleep).    Dispense:  120 tablet    Refill:  1  . DULoxetine (CYMBALTA) 60 MG capsule    Sig: Take 1 capsule  (60 mg total) by mouth daily.    Dispense:  90 capsule    Refill:  3    I personally performed the services described in this documentation, which was scribed in my presence. The recorded information has been reviewed and considered, and addended by me as needed.  Norberto Sorenson, MD MPH

## 2015-01-17 NOTE — Patient Instructions (Signed)
We will try maxalt rather than imitrex - if this is not on your preferred insurance list please call them to find out what is.  If the maxalt is not working, please call insurance to find out what other meds are on the preferred list. Increase your cymbtalta from 40 to 60. New rx sent to pharm. Start taking 1/2 tab topamax in the morning and 1 tab at night. I did not send a new rx to your pharmacy as you had a lot of this but when your current bottle runs out please let us know prior so we can send in new rx for appropriate amount. We increased her Aterac for 25- 50, and can take 3 times a day rather than 4. I send ta new prescription to pharmacy but did not mean to, so use your bottle at home for the meantime, until you run out.  Start melatonin 2 hours before bed. Recheck in two months. If your anxiety persist, we can start using puspar, but I think the above medication will help.

## 2015-02-01 ENCOUNTER — Other Ambulatory Visit: Payer: Self-pay | Admitting: Family Medicine

## 2015-02-13 ENCOUNTER — Other Ambulatory Visit: Payer: Self-pay

## 2015-02-13 MED ORDER — TOPIRAMATE 100 MG PO TABS: ORAL_TABLET | ORAL | Status: DC

## 2015-02-13 NOTE — Telephone Encounter (Signed)
Sig on Topamax    topiramate (TOPAMAX) 100 MG tablet [[id number]]     Order Details    Dose, Route, Frequency: As Directed    Indications of Use: Migraine   Dispense Quantity:  30 tablet Refills:  0 Fills Remaining:  0          Sig: Take 1/2 tab qam and 1 tab qhs         Written Date:  01/17/15 Expiration Date:  01/17/16     Start Date:  01/17/15 End Date:  --     Ordering Provider:  -- Authorizing Provider:  [name], MD Ordering User:  [name], MD         Sig is supposed to be #45, changed Rx due to pharmacy not being able to fill Rx. Pt called today to see if we could change rx. Rx sent in.

## 2015-02-28 ENCOUNTER — Other Ambulatory Visit: Payer: Self-pay

## 2015-02-28 NOTE — Telephone Encounter (Signed)
Dr Clelia CroftShaw, pt just had check up last month but don't see AR discussed recently. OK to give RFs?

## 2015-03-05 MED ORDER — CETIRIZINE HCL 10 MG PO TABS: 10.0000 mg | ORAL_TABLET | Freq: Every day | ORAL | Status: DC

## 2015-03-11 ENCOUNTER — Other Ambulatory Visit: Payer: Self-pay | Admitting: Family Medicine

## 2015-03-13 ENCOUNTER — Other Ambulatory Visit: Payer: Self-pay | Admitting: Family Medicine

## 2015-04-11 ENCOUNTER — Other Ambulatory Visit: Payer: Self-pay | Admitting: Family Medicine

## 2015-04-14 ENCOUNTER — Ambulatory Visit (INDEPENDENT_AMBULATORY_CARE_PROVIDER_SITE_OTHER): Payer: Commercial Managed Care - HMO | Admitting: Family Medicine

## 2015-04-14 VITALS — BP 118/72 | HR 91 | Temp 98.4°F | Resp 18 | Ht 62.0 in | Wt 191.0 lb

## 2015-04-14 DIAGNOSIS — N183 Chronic kidney disease, stage 3 unspecified: Secondary | ICD-10-CM

## 2015-04-14 DIAGNOSIS — E785 Hyperlipidemia, unspecified: Secondary | ICD-10-CM | POA: Diagnosis not present

## 2015-04-14 DIAGNOSIS — I10 Essential (primary) hypertension: Secondary | ICD-10-CM | POA: Diagnosis not present

## 2015-04-14 DIAGNOSIS — F339 Major depressive disorder, recurrent, unspecified: Secondary | ICD-10-CM

## 2015-04-14 DIAGNOSIS — G43909 Migraine, unspecified, not intractable, without status migrainosus: Secondary | ICD-10-CM | POA: Diagnosis not present

## 2015-04-14 DIAGNOSIS — E039 Hypothyroidism, unspecified: Secondary | ICD-10-CM | POA: Diagnosis not present

## 2015-04-14 DIAGNOSIS — G4700 Insomnia, unspecified: Secondary | ICD-10-CM

## 2015-04-14 LAB — POCT URINALYSIS DIP (MANUAL ENTRY)
Bilirubin, UA: NEGATIVE
Blood, UA: NEGATIVE
Glucose, UA: NEGATIVE
Ketones, POC UA: NEGATIVE
Leukocytes, UA: NEGATIVE
NITRITE UA: NEGATIVE
Protein Ur, POC: NEGATIVE
Spec Grav, UA: 1.01
Urobilinogen, UA: 0.2
pH, UA: 5.5

## 2015-04-14 LAB — COMPREHENSIVE METABOLIC PANEL
ALBUMIN: 4.3 g/dL (ref 3.6–5.1)
ALT: 15 U/L (ref 6–29)
AST: 17 U/L (ref 10–35)
Alkaline Phosphatase: 80 U/L (ref 33–130)
BILIRUBIN TOTAL: 0.4 mg/dL (ref 0.2–1.2)
BUN: 20 mg/dL (ref 7–25)
CO2: 25 mmol/L (ref 20–31)
CREATININE: 1.46 mg/dL — AB (ref 0.50–0.99)
Calcium: 9.6 mg/dL (ref 8.6–10.4)
Chloride: 106 mmol/L (ref 98–110)
GLUCOSE: 91 mg/dL (ref 65–99)
Potassium: 4.6 mmol/L (ref 3.5–5.3)
Sodium: 139 mmol/L (ref 135–146)
Total Protein: 7.2 g/dL (ref 6.1–8.1)

## 2015-04-14 LAB — LIPID PANEL
Cholesterol: 289 mg/dL — ABNORMAL HIGH (ref 125–200)
HDL: 56 mg/dL (ref >=46)
LDL CALC: 179 mg/dL — AB (ref <130)
TRIGLYCERIDES: 269 mg/dL — AB (ref <150)
Total CHOL/HDL Ratio: 5.2 Ratio — ABNORMAL HIGH (ref <=5.0)
VLDL: 54 mg/dL — AB (ref <30)

## 2015-04-14 LAB — POC MICROSCOPIC URINALYSIS (UMFC): MUCUS RE: ABSENT

## 2015-04-14 LAB — TSH: TSH: 2.537 u[IU]/mL (ref 0.350–4.500)

## 2015-04-14 MED ORDER — GABAPENTIN 300 MG PO CAPS: 600.0000 mg | ORAL_CAPSULE | Freq: Two times a day (BID) | ORAL | Status: DC

## 2015-04-14 MED ORDER — FUROSEMIDE 40 MG PO TABS: 40.0000 mg | ORAL_TABLET | Freq: Every day | ORAL | Status: DC

## 2015-04-14 MED ORDER — TOPIRAMATE 100 MG PO TABS: ORAL_TABLET | ORAL | Status: DC

## 2015-04-14 MED ORDER — BUTALBITAL-APAP-CAFFEINE 50-325-40 MG PO TABS: 1.0000 | ORAL_TABLET | Freq: Four times a day (QID) | ORAL | Status: DC | PRN

## 2015-04-14 MED ORDER — TEMAZEPAM 7.5 MG PO CAPS
ORAL_CAPSULE | ORAL | Status: DC
Start: 2015-04-14 — End: 2015-06-06

## 2015-04-14 MED ORDER — HYDROXYZINE HCL 50 MG PO TABS: 50.0000 mg | ORAL_TABLET | Freq: Four times a day (QID) | ORAL | Status: DC | PRN

## 2015-04-14 MED ORDER — ESOMEPRAZOLE MAGNESIUM 40 MG PO CPDR: 40.0000 mg | DELAYED_RELEASE_CAPSULE | Freq: Every day | ORAL | Status: DC

## 2015-04-14 NOTE — Patient Instructions (Addendum)
Stop the melatonin for the next several weeks, then try restarting it as you are coming off of the restoril.  Let me know if it doesn't work. Insomnia Insomnia is a sleep disorder that makes it difficult to fall asleep or to stay asleep. Insomnia can cause tiredness (fatigue), low energy, difficulty concentrating, mood swings, and poor performance at work or school.  There are three different ways to classify insomnia:  Difficulty falling asleep.  Difficulty staying asleep.  Waking up too early in the morning. Any type of insomnia can be long-term (chronic) or short-term (acute). Both are common. Short-term insomnia usually lasts for three months or less. Chronic insomnia occurs at least three times a week for longer than three months. CAUSES  Insomnia may be caused by another condition, situation, or substance, such as:  Anxiety.  Certain medicines.  Gastroesophageal reflux disease (GERD) or other gastrointestinal conditions.  Asthma or other breathing conditions.  Restless legs syndrome, sleep apnea, or other sleep disorders.  Chronic pain.  Menopause. This may include hot flashes.  Stroke.  Abuse of alcohol, tobacco, or illegal drugs.  Depression.  Caffeine.   Neurological disorders, such as Alzheimer disease.  An overactive thyroid (hyperthyroidism). The cause of insomnia may not be known. RISK FACTORS Risk factors for insomnia include:  Gender. Women are more commonly affected than men.  Age. Insomnia is more common as you get older.  Stress. This may involve your professional or personal life.  Income. Insomnia is more common in people with lower income.  Lack of exercise.   Irregular work schedule or night shifts.  Traveling between different time zones. SIGNS AND SYMPTOMS If you have insomnia, trouble falling asleep or trouble staying asleep is the main symptom. This may lead to other symptoms, such as:  Feeling fatigued.  Feeling nervous about  going to sleep.  Not feeling rested in the morning.  Having trouble concentrating.  Feeling irritable, anxious, or depressed. TREATMENT  Treatment for insomnia depends on the cause. If your insomnia is caused by an underlying condition, treatment will focus on addressing the condition. Treatment may also include:   Medicines to help you sleep.  Counseling or therapy.  Lifestyle adjustments. HOME CARE INSTRUCTIONS   Take medicines only as directed by your health care provider.  Keep regular sleeping and waking hours. Avoid naps.  Keep a sleep diary to help you and your health care provider figure out what could be causing your insomnia. Include:   When you sleep.  When you wake up during the night.  How well you sleep.   How rested you feel the next day.  Any side effects of medicines you are taking.  What you eat and drink.   Make your bedroom a comfortable place where it is easy to fall asleep:  Put up shades or special blackout curtains to block light from outside.  Use a white noise machine to block noise.  Keep the temperature cool.   Exercise regularly as directed by your health care provider. Avoid exercising right before bedtime.  Use relaxation techniques to manage stress. Ask your health care provider to suggest some techniques that may work well for you. These may include:  Breathing exercises.  Routines to release muscle tension.  Visualizing peaceful scenes.  Cut back on alcohol, caffeinated beverages, and cigarettes, especially close to bedtime. These can disrupt your sleep.  Do not overeat or eat spicy foods right before bedtime. This can lead to digestive discomfort that can make it hard  for you to sleep.  Limit screen use before bedtime. This includes:  Watching TV.  Using your smartphone, tablet, and computer.  Stick to a routine. This can help you fall asleep faster. Try to do a quiet activity, brush your teeth, and go to bed at the  same time each night.  Get out of bed if you are still awake after 15 minutes of trying to sleep. Keep the lights down, but try reading or doing a quiet activity. When you feel sleepy, go back to bed.  Make sure that you drive carefully. Avoid driving if you feel very sleepy.  Keep all follow-up appointments as directed by your health care provider. This is important. SEEK MEDICAL CARE IF:   You are tired throughout the day or have trouble in your daily routine due to sleepiness.  You continue to have sleep problems or your sleep problems get worse. SEEK IMMEDIATE MEDICAL CARE IF:   You have serious thoughts about hurting yourself or someone else.   This information is not intended to replace advice given to you by your health care provider. Make sure you discuss any questions you have with your health care provider.   Document Released: 04/30/2000 Document Revised: 01/22/2015 Document Reviewed: 02/01/2014 Elsevier Interactive Patient Education [employer].
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
## 2015-04-15 ENCOUNTER — Telehealth: Payer: Self-pay

## 2015-04-15 NOTE — Telephone Encounter (Signed)
Nope, will cancel and do next time. Thanks.

## 2015-04-15 NOTE — Telephone Encounter (Signed)
Lavender did not get sent for CBC. I apologize. Do you want me to have pt RTC for redraw?

## 2015-04-20 ENCOUNTER — Other Ambulatory Visit: Payer: Self-pay | Admitting: Family Medicine

## 2015-05-05 ENCOUNTER — Other Ambulatory Visit: Payer: Self-pay | Admitting: Family Medicine

## 2015-05-06 ENCOUNTER — Telehealth: Payer: Self-pay | Admitting: *Deleted

## 2015-05-06 ENCOUNTER — Other Ambulatory Visit: Payer: Self-pay | Admitting: *Deleted

## 2015-05-06 NOTE — Telephone Encounter (Signed)
°  Medication List       This list is accurate as of: 05/06/15  6:19 PM.  Always use your most recent med list.               butalbital-acetaminophen-caffeine 50-325-40 MG tablet  Commonly known as:  FIORICET  Take 1-2 tablets by mouth every 6 (six) hours as needed for headache.     cetirizine 10 MG tablet  Commonly known as:  ZYRTEC  Take 1 tablet (10 mg total) by mouth at bedtime.     cholecalciferol 1000 UNITS tablet  Commonly known as:  VITAMIN D  Take 1 tablet (1,000 Units total) by mouth daily at 12 noon.     DULoxetine 60 MG capsule  Commonly known as:  CYMBALTA  Take 1 capsule (60 mg total) by mouth daily.     esomeprazole 40 MG capsule  Commonly known as:  NEXIUM  Take 1 capsule (40 mg total) by mouth daily.     furosemide 40 MG tablet  Commonly known as:  LASIX  Take 1 tablet (40 mg total) by mouth daily.     gabapentin 300 MG capsule  Commonly known as:  NEURONTIN  Take 2 capsules (600 mg total) by mouth 2 (two) times daily.     hydrOXYzine 50 MG tablet  Commonly known as:  ATARAX/VISTARIL  Take 1 tablet (50 mg total) by mouth every 6 (six) hours as needed.     KLOR-CON M20 20 MEQ tablet  Generic drug:  potassium chloride SA  TAKE 1 TABLET (20 MEQ TOTAL) BY MOUTH 2 (TWO) TIMES DAILY.     levothyroxine 50 MCG tablet  Commonly known as:  SYNTHROID, LEVOTHROID  TAKE 1 TABLET (50 MCG TOTAL) BY MOUTH DAILY.     levothyroxine 50 MCG tablet  Commonly known as:  SYNTHROID, LEVOTHROID  TAKE 1 TABLET BY MOUTH EVERY DAY... MAX OF 30 DAYS ON INSURANCE     lidocaine 5 %  Commonly known as:  LIDODERM  Place 1 patch onto the skin daily. Remove & Discard patch within 12 hours or as directed by MD     rizatriptan 10 MG tablet  Commonly known as:  MAXALT  Take 0.5 tablets (5 mg total) by mouth as needed for migraine. May repeat in 2 hours if needed     temazepam 7.5 MG capsule  Commonly known as:  RESTORIL  Take 2 capsules po qhs x 2 wks, then 1 cap po qhs x 2  wks, then 1 tab po qhs prn     topiramate 100 MG tablet  Commonly known as:  TOPAMAX  TAKE 1/2 TABLET BY MOUTH IN THE MORNING AND 1 TAB AT BEDTIME       Pt called stating that the following Rx should be sent by mail order: Gabapentin- 300 MG, Levothyroxine- 50 MCG, Klor-con- 20 MEQ, and Furosemide 40 mg should all be filled by mail order and not [facility].   [phone]

## 2015-05-06 NOTE — Telephone Encounter (Signed)
LMOM to cb.  [facility] sent us a refill request for levothyroxine.  Pt [facility] been using [facility].  Just wanted to call and confirm.  If so please get info and send message back to me.

## 2015-05-07 NOTE — Telephone Encounter (Signed)
Pt called stating that the following Rx should be sent by mail order: Gabapentin- 300 MG, Levothyroxine- 50 MCG, Klor-con- 20 MEQ, and Furosemide 40 mg should all be filled by mail order and not [facility].   [phone]

## 2015-05-07 NOTE — Telephone Encounter (Signed)
See also the refill request from 12/20.

## 2015-05-08 MED ORDER — LEVOTHYROXINE SODIUM 50 MCG PO TABS: ORAL_TABLET | ORAL | Status: DC

## 2015-05-08 MED ORDER — GABAPENTIN 300 MG PO CAPS: 600.0000 mg | ORAL_CAPSULE | Freq: Two times a day (BID) | ORAL | Status: DC

## 2015-05-08 MED ORDER — POTASSIUM CHLORIDE CRYS ER 20 MEQ PO TBCR: EXTENDED_RELEASE_TABLET | ORAL | Status: DC

## 2015-05-08 MED ORDER — FUROSEMIDE 40 MG PO TABS: 40.0000 mg | ORAL_TABLET | Freq: Every day | ORAL | Status: DC

## 2015-05-08 NOTE — Telephone Encounter (Signed)
Sent all 4 meds pt listed to mail order. Denied RF to [facility].

## 2015-05-14 ENCOUNTER — Telehealth: Payer: Self-pay

## 2015-05-14 DIAGNOSIS — G5791 Unspecified mononeuropathy of right lower limb: Secondary | ICD-10-CM

## 2015-05-14 DIAGNOSIS — G5790 Unspecified mononeuropathy of unspecified lower limb: Secondary | ICD-10-CM | POA: Insufficient documentation

## 2015-05-14 DIAGNOSIS — G8929 Other chronic pain: Secondary | ICD-10-CM | POA: Insufficient documentation

## 2015-05-14 DIAGNOSIS — G709 Myoneural disorder, unspecified: Secondary | ICD-10-CM

## 2015-05-14 DIAGNOSIS — M79604 Pain in right leg: Secondary | ICD-10-CM

## 2015-05-14 HISTORY — DX: Other chronic pain: G89.29

## 2015-05-14 MED ORDER — LIDOCAINE 5 % EX PTCH: 1.0000 | MEDICATED_PATCH | Freq: Every day | CUTANEOUS | Status: DC
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
## 2015-05-14 NOTE — Telephone Encounter (Signed)
Thank you so much Britta MccreedyBarbara - you continue to be an irreplaceable fount of knowledge. Pt does have chronic right leg pain, and lower extremity neuropathy/radiculopathy. Have added these diagnosis and assted with lidoderm. If you don't think this will work, let me know and I'll call Delaynee to better clarify her symptoms as I'm sure she does have lower ext joint pain since uses cane/walker and freq falls.  THANKS!!!!

## 2015-05-18 DIAGNOSIS — I509 Heart failure, unspecified: Secondary | ICD-10-CM

## 2015-05-18 HISTORY — DX: Heart failure, unspecified: I50.9

## 2015-05-19 NOTE — Telephone Encounter (Signed)
PA completed on covermymeds for Dx of G57.9, neuropathy of lower extremities. Pending.

## 2015-05-20 NOTE — Telephone Encounter (Signed)
PA was denied. It is only ever approved for post herpetic neuralgia, neuropathic cancer pain, or diabetic neuropathy. This ins does not cover for any type of joint pain, so I had tried to get coverage under the Dx of LE neuropathy. Other forms of neuropathy not assoc w/DM are not covered. I didn't see any indication anywhere that pt has DM. Do you want me to advise pt to try the 4% lidocaine patches now available OTC? Or Rx something else?

## 2015-05-21 NOTE — Telephone Encounter (Signed)
Yes, I think that she is already using the 4% otc - let her know that this will be her best alternative. Thanks.

## 2015-05-23 NOTE — Telephone Encounter (Signed)
Called and advised pt who agreed to use the 4% OTC.

## 2015-06-05 ENCOUNTER — Ambulatory Visit (INDEPENDENT_AMBULATORY_CARE_PROVIDER_SITE_OTHER): Payer: Medicare HMO | Admitting: Family Medicine

## 2015-06-05 ENCOUNTER — Encounter: Payer: Self-pay | Admitting: Family Medicine

## 2015-06-05 VITALS — BP 110/72 | HR 72 | Temp 97.8°F | Resp 16 | Ht 62.0 in | Wt 189.0 lb

## 2015-06-05 DIAGNOSIS — M549 Dorsalgia, unspecified: Secondary | ICD-10-CM

## 2015-06-05 DIAGNOSIS — Z79899 Other long term (current) drug therapy: Secondary | ICD-10-CM

## 2015-06-05 DIAGNOSIS — F418 Other specified anxiety disorders: Secondary | ICD-10-CM

## 2015-06-05 DIAGNOSIS — G43909 Migraine, unspecified, not intractable, without status migrainosus: Secondary | ICD-10-CM

## 2015-06-05 DIAGNOSIS — I10 Essential (primary) hypertension: Secondary | ICD-10-CM | POA: Diagnosis not present

## 2015-06-05 DIAGNOSIS — E039 Hypothyroidism, unspecified: Secondary | ICD-10-CM

## 2015-06-05 DIAGNOSIS — G8929 Other chronic pain: Secondary | ICD-10-CM

## 2015-06-05 DIAGNOSIS — N183 Chronic kidney disease, stage 3 unspecified: Secondary | ICD-10-CM

## 2015-06-05 DIAGNOSIS — F329 Major depressive disorder, single episode, unspecified: Secondary | ICD-10-CM

## 2015-06-05 DIAGNOSIS — G4700 Insomnia, unspecified: Secondary | ICD-10-CM | POA: Diagnosis not present

## 2015-06-05 DIAGNOSIS — F419 Anxiety disorder, unspecified: Secondary | ICD-10-CM

## 2015-06-05 MED ORDER — DIPHENOXYLATE-ATROPINE 2.5-0.025 MG PO TABS: 1.0000 | ORAL_TABLET | Freq: Four times a day (QID) | ORAL | Status: DC | PRN

## 2015-06-05 MED ORDER — DICYCLOMINE HCL 20 MG PO TABS: 20.0000 mg | ORAL_TABLET | Freq: Three times a day (TID) | ORAL | Status: DC
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
## 2015-06-06 ENCOUNTER — Telehealth: Payer: Self-pay

## 2015-06-06 MED ORDER — ALPRAZOLAM 2 MG PO TABS: 2.0000 mg | ORAL_TABLET | Freq: Every evening | ORAL | Status: DC | PRN

## 2015-06-06 MED ORDER — DICYCLOMINE HCL 20 MG PO TABS: 20.0000 mg | ORAL_TABLET | Freq: Three times a day (TID) | ORAL | Status: DC

## 2015-06-06 MED ORDER — HYDROXYZINE HCL 50 MG PO TABS: 50.0000 mg | ORAL_TABLET | Freq: Four times a day (QID) | ORAL | Status: DC | PRN

## 2015-06-06 MED ORDER — DIPHENOXYLATE-ATROPINE 2.5-0.025 MG PO TABS: 1.0000 | ORAL_TABLET | Freq: Four times a day (QID) | ORAL | Status: DC | PRN

## 2015-06-06 MED ORDER — DULOXETINE HCL 60 MG PO CPEP: 60.0000 mg | ORAL_CAPSULE | Freq: Every day | ORAL | Status: DC

## 2015-06-06 MED ORDER — TOPIRAMATE 100 MG PO TABS: ORAL_TABLET | ORAL | Status: DC

## 2015-06-06 NOTE — Telephone Encounter (Signed)
Rx's sent to [facility] in [place].

## 2015-06-06 NOTE — Telephone Encounter (Signed)
Patients medications prescribed  By Dr. [name] on 1/19 needed to go to [facility] [place].  They went to Mail order pharmacy instead.    [phone] Judie Petit)

## 2015-06-13 ENCOUNTER — Emergency Department (HOSPITAL_COMMUNITY)
Admission: EM | Admit: 2015-06-13 | Discharge: 2015-06-14 | Disposition: A | Discharge status: Home / Self Care | Payer: Commercial Managed Care - HMO | Attending: Emergency Medicine | Admitting: Emergency Medicine

## 2015-06-13 ENCOUNTER — Ambulatory Visit (INDEPENDENT_AMBULATORY_CARE_PROVIDER_SITE_OTHER): Payer: Medicare HMO | Admitting: Urgent Care

## 2015-06-13 ENCOUNTER — Encounter (HOSPITAL_COMMUNITY): Payer: Self-pay | Admitting: Emergency Medicine

## 2015-06-13 VITALS — BP 116/74 | HR 81 | Temp 97.5°F | Resp 18 | Wt 195.0 lb

## 2015-06-13 DIAGNOSIS — F333 Major depressive disorder, recurrent, severe with psychotic symptoms: Secondary | ICD-10-CM

## 2015-06-13 DIAGNOSIS — N189 Chronic kidney disease, unspecified: Secondary | ICD-10-CM | POA: Insufficient documentation

## 2015-06-13 DIAGNOSIS — Z8719 Personal history of other diseases of the digestive system: Secondary | ICD-10-CM | POA: Diagnosis not present

## 2015-06-13 DIAGNOSIS — Z9181 History of falling: Secondary | ICD-10-CM

## 2015-06-13 DIAGNOSIS — F329 Major depressive disorder, single episode, unspecified: Secondary | ICD-10-CM | POA: Diagnosis not present

## 2015-06-13 DIAGNOSIS — E785 Hyperlipidemia, unspecified: Secondary | ICD-10-CM | POA: Diagnosis not present

## 2015-06-13 DIAGNOSIS — I5030 Unspecified diastolic (congestive) heart failure: Secondary | ICD-10-CM | POA: Insufficient documentation

## 2015-06-13 DIAGNOSIS — R45851 Suicidal ideations: Secondary | ICD-10-CM

## 2015-06-13 DIAGNOSIS — R197 Diarrhea, unspecified: Secondary | ICD-10-CM | POA: Diagnosis not present

## 2015-06-13 DIAGNOSIS — Z79899 Other long term (current) drug therapy: Secondary | ICD-10-CM | POA: Insufficient documentation

## 2015-06-13 DIAGNOSIS — E039 Hypothyroidism, unspecified: Secondary | ICD-10-CM | POA: Diagnosis not present

## 2015-06-13 DIAGNOSIS — R296 Repeated falls: Secondary | ICD-10-CM | POA: Diagnosis not present

## 2015-06-13 DIAGNOSIS — G43909 Migraine, unspecified, not intractable, without status migrainosus: Secondary | ICD-10-CM | POA: Diagnosis not present

## 2015-06-13 DIAGNOSIS — G478 Other sleep disorders: Secondary | ICD-10-CM | POA: Diagnosis not present

## 2015-06-13 DIAGNOSIS — I129 Hypertensive chronic kidney disease with stage 1 through stage 4 chronic kidney disease, or unspecified chronic kidney disease: Secondary | ICD-10-CM | POA: Diagnosis not present

## 2015-06-13 DIAGNOSIS — F411 Generalized anxiety disorder: Secondary | ICD-10-CM | POA: Diagnosis not present

## 2015-06-13 DIAGNOSIS — G8929 Other chronic pain: Secondary | ICD-10-CM | POA: Insufficient documentation

## 2015-06-13 DIAGNOSIS — F39 Unspecified mood [affective] disorder: Secondary | ICD-10-CM | POA: Diagnosis present

## 2015-06-13 LAB — CBC
HCT: 43.1 % (ref 36.0–46.0)
Hemoglobin: 13.4 g/dL (ref 12.0–15.0)
MCH: 25 pg — ABNORMAL LOW (ref 26.0–34.0)
MCHC: 31.1 g/dL (ref 30.0–36.0)
MCV: 80.6 fL (ref 78.0–100.0)
Platelets: 217 10*3/uL (ref 150–400)
RBC: 5.35 MIL/uL — ABNORMAL HIGH (ref 3.87–5.11)
RDW: 14.2 % (ref 11.5–15.5)
WBC: 8.5 10*3/uL (ref 4.0–10.5)

## 2015-06-13 LAB — TSH: TSH: 1.737 u[IU]/mL (ref 0.350–4.500)

## 2015-06-13 LAB — COMPREHENSIVE METABOLIC PANEL
ALT: 21 U/L (ref 14–54)
AST: 27 U/L (ref 15–41)
Albumin: 4.1 g/dL (ref 3.5–5.0)
Alkaline Phosphatase: 79 U/L (ref 38–126)
Anion gap: 8 (ref 5–15)
BUN: 13 mg/dL (ref 6–20)
CO2: 26 mmol/L (ref 22–32)
Calcium: 9.4 mg/dL (ref 8.9–10.3)
Chloride: 103 mmol/L (ref 101–111)
Creatinine, Ser: 1.45 mg/dL — ABNORMAL HIGH (ref 0.44–1.00)
GFR calc Af Amer: 44 mL/min — ABNORMAL LOW (ref >60)
GFR calc non Af Amer: 38 mL/min — ABNORMAL LOW (ref >60)
Glucose, Bld: 122 mg/dL — ABNORMAL HIGH (ref 65–99)
Potassium: 3.7 mmol/L (ref 3.5–5.1)
Sodium: 137 mmol/L (ref 135–145)
Total Bilirubin: 0.5 mg/dL (ref 0.3–1.2)
Total Protein: 7.3 g/dL (ref 6.5–8.1)

## 2015-06-13 LAB — ACETAMINOPHEN LEVEL: Acetaminophen (Tylenol), Serum: <10 ug/mL — ABNORMAL LOW (ref 10–30)

## 2015-06-13 LAB — RAPID URINE DRUG SCREEN, HOSP PERFORMED
Amphetamines: NOT DETECTED
Barbiturates: NOT DETECTED
Benzodiazepines: POSITIVE — AB
Cocaine: NOT DETECTED
Opiates: NOT DETECTED
Tetrahydrocannabinol: NOT DETECTED

## 2015-06-13 LAB — SALICYLATE LEVEL: Salicylate Lvl: <4 mg/dL (ref 2.8–30.0)

## 2015-06-13 LAB — ETHANOL: Alcohol, Ethyl (B): <5 mg/dL (ref <5)

## 2015-06-13 NOTE — Patient Instructions (Signed)
Emergency Department - [facility] [phone] [address]  [place], [place] [address]   Major Depressive Disorder Major depressive disorder is a mental illness. It also may be called clinical depression or unipolar depression. Major depressive disorder usually causes feelings of sadness, hopelessness, or helplessness. Some people with this disorder do not feel particularly sad but lose interest in doing things they used to enjoy (anhedonia). Major depressive disorder also can cause physical symptoms. It can interfere with work, school, relationships, and other normal everyday activities. The disorder varies in severity but is longer lasting and more serious than the sadness we all feel from time to time in our lives. Major depressive disorder often is triggered by stressful life events or major life changes. Examples of these triggers include divorce, loss of your job or home, a move, and the death of a family member or close friend. Sometimes this disorder occurs for no obvious reason at all. People who have family members with major depressive disorder or bipolar disorder are at higher risk for developing this disorder, with or without life stressors. Major depressive disorder can occur at any age. It may occur just once in your life (single episode major depressive disorder). It may occur multiple times (recurrent major depressive disorder). SYMPTOMS People with major depressive disorder have either anhedonia or depressed mood on nearly a daily basis for at least 2 weeks or longer. Symptoms of depressed mood include:  Feelings of sadness (blue or down in the dumps) or emptiness.  Feelings of hopelessness or helplessness.  Tearfulness or episodes of crying (may be observed by others).  Irritability (children and adolescents). In addition to depressed mood or anhedonia or both, people with this disorder have at least four of the following symptoms:  Difficulty sleeping or sleeping  too much.   Significant change (increase or decrease) in appetite or weight.   Lack of energy or motivation.  Feelings of guilt and worthlessness.   Difficulty concentrating, remembering, or making decisions.  Unusually slow movement (psychomotor retardation) or restlessness (as observed by others).   Recurrent wishes for death, recurrent thoughts of self-harm (suicide), or a suicide attempt. People with major depressive disorder commonly have persistent negative thoughts about themselves, other people, and the world. People with severe major depressive disorder may experiencedistorted beliefs or perceptions about the world (psychotic delusions). They also may see or hear things that are not real (psychotic hallucinations). DIAGNOSIS Major depressive disorder is diagnosed through an assessment by your health care provider. Your health care provider will ask aboutaspects of your daily life, such as mood,sleep, and appetite, to see if you have the diagnostic symptoms of major depressive disorder. Your health care provider may ask about your medical history and use of alcohol or drugs, including prescription medicines. Your health care provider also may do a physical exam and blood work. This is because certain medical conditions and the use of certain substances can cause major depressive disorder-like symptoms (secondary depression). Your health care provider also may refer you to a mental health specialist for further evaluation and treatment. TREATMENT It is important to recognize the symptoms of major depressive disorder and seek treatment. The following treatments can be prescribed for this disorder:   Medicine. Antidepressant medicines usually are prescribed. Antidepressant medicines are thought to correct chemical imbalances in the brain that are commonly associated with major depressive disorder. Other types of medicine may be added if the symptoms do not respond to antidepressant  medicines alone or if psychotic delusions or  hallucinations occur.  Talk therapy. Talk therapy can be helpful in treating major depressive disorder by providing support, education, and guidance. Certain types of talk therapy also can help with negative thinking (cognitive behavioral therapy) and with relationship issues that trigger this disorder (interpersonal therapy). A mental health specialist can help determine which treatment is best for you. Most people with major depressive disorder do well with a combination of medicine and talk therapy. Treatments involving electrical stimulation of the brain can be used in situations with extremely severe symptoms or when medicine and talk therapy do not work over time. These treatments include electroconvulsive therapy, transcranial magnetic stimulation, and vagal nerve stimulation.   This information is not intended to replace advice given to you by your health care provider. Make sure you discuss any questions you have with your health care provider.   Document Released: 08/28/2012 Document Revised: 05/24/2014 Document Reviewed: 08/28/2012 Elsevier Interactive Patient Education [employer]

## 2015-06-13 NOTE — Progress Notes (Signed)
    MRN: [id number] DOB: [birth year]  Subjective:   Jennifer Davidson is a 60 y.o. female presenting for chief complaint of Fall  Reports that patient has had several falls, feels sedated on her new medication regimen. Patient was seen on 06/05/2015 and there were serious concerns for her lack of sleep, started Xanax  as needed for sleep. Patient admits severe depression, thoughts about death, states that "she is tired of trying and is ready for this to be over". She does not enjoy anything that she is doing anymore. Patient admits that this all started when her brother moved ~2 weeks ago and feels like she is very much alone. She barely sees her husband by her report. However, her husband reports that she has also been hallucinating, speaking to people that aren't there. He is very concerned for her and her safety. Of note, patient was admitted for major depressive episode with suicide risk in 11/2014. Patient was managed with Cymbalta, hydroxyzine, gabapentin. She was seeing a psychiatrist after discharge but her insurance coverage changed and she has not been able to see them again.  Jennifer Davidson has a current medication list which includes the following prescription(s): alprazolam, butalbital-acetaminophen-caffeine, cetirizine, cholecalciferol, dicyclomine, diphenoxylate-atropine, duloxetine, furosemide, gabapentin, hydroxyzine, levothyroxine, potassium chloride sa, rizatriptan, and topiramate. Also has no allergies on file.  Jennifer Davidson  has a past medical history of Hypertension; Cholesterol serum increased; Hypothyroidism; Sleep apnea; Migraine; Depression; Neuromuscular disorder (HCC); GERD (gastroesophageal reflux disease); Anxiety; Chronic back pain; Chronic kidney disease; Tachycardia; Hyperparathyroidism (HCC); and Diastolic heart failure (HCC). Also  has past surgical history that includes Abdominal hysterectomy; Back surgery; and Appendectomy.  Objective:   Vitals: BP 116/74 mmHg  Pulse 81  Temp(Src)  97.5 F (36.4 C) (Oral)  Resp 18  Wt 195 lb (88.451 kg)  SpO2 98%  Physical Exam  Constitutional: She is oriented to person, place, and time. She appears well-developed and well-nourished.  Cardiovascular: Normal rate.   Pulmonary/Chest: Effort normal.  Neurological: She is alert and oriented to person, place, and time.  Psychiatric: Her mood appears not anxious. Her affect is not labile. Her speech is delayed and slurred. Her speech is not rapid and/or pressured. She is slowed. She is not agitated, not aggressive, not hyperactive and not actively hallucinating. Thought content is not delusional. She exhibits a depressed mood. She expresses suicidal (passive thoughts about death) ideation. She expresses no homicidal ideation. She expresses no suicidal plans and no homicidal plans. She is communicative.   Assessment and Plan :   1. Severe episode of recurrent major depressive disorder, with psychotic features (HCC) 2. Suicidal thoughts  3. Multiple falls 4. Risk for falls - Patient is in severe depressive episode with risk for suicide. I discussed patient's management and need for emergent psych eval which she agreed to. Her husband is also in agreement. For now, patient will stop Xanax since this is very closely associated with her falls. It is likely that she has been over-sedated with using hydroxyzine, gabapentin, Xanax. Patient will report to [facility] for psych consult. She is to stop using Xanax. [facility] is aware that she is on her way.  Wallis Bamberg, [name] [facility] [phone] 06/13/2015 3:05 PM

## 2015-06-13 NOTE — ED Provider Notes (Signed)
CSN: [id number]     Arrival date & time 06/13/15  1600 History  By signing my name below, I, Phillis Haggis, attest that this documentation has been prepared under the direction and in the presence of [employer], [name]. Electronically Signed: Phillis Haggis, ED Scribe. 06/13/2015. 4:45 PM.   Chief Complaint  Patient presents with  . Suicidal   The history is provided by the patient. No language interpreter was used.  HPI Comments: Jennifer Davidson is a 61 y.o. female with a hx of HTN, sleep apnea, neuromuscular disorder, diastolic heart failure,migraines, hypothyroidism, depression and anxiety who presents to the Emergency Department for major depressive disorder with SI. Pt. was admitted for similar symptoms in July 2016. Per husband, her medications were "out of balance" in July and he now believes she needs help getting her medications worked out again. She was discharged on Cymbalta and vistaril at that time. No longer taking Cymbalta. She was seen by PCP recently for difficulty sleeping and started taking  xanax for this on Saturday 1/21. Patient states she is still having trouble sleeping. Per husband, since starting this medication she has not slept much more than before, however, she has been talking to her deceased relatives and her brother who recently moved away. Patient with no recollection of this, but says it must be true. Patient admits to wanting to harm herself, stating that she "just doesn't want to do this anymore" Pt does not currently have an active plan. No history of self injury. No homicidal ideations. Associated symptoms include decreased appetite. Still enjoys spending time with her Granddaughter Maralyn Sago, however other activities she use to enjoy do not give her as much fulfillment. Husband at bedside very supportive.   Past Medical History  Diagnosis Date  . Hypertension   . Cholesterol serum increased   . Hypothyroidism   . Sleep apnea   . Migraine   . Depression    . Neuromuscular disorder (HCC)   . GERD (gastroesophageal reflux disease)   . Anxiety   . Chronic back pain   . Chronic kidney disease   . Tachycardia   . Hyperparathyroidism (HCC)   . Diastolic heart failure [facility])    Past Surgical History  Procedure Laterality Date  . Abdominal hysterectomy    . Back surgery    . Appendectomy     Family History  Problem Relation Age of Onset  . Diabetes Mother   . Heart disease Mother   . Kidney disease Mother   . Thyroid disease Mother   . Heart disease Father   . Seizures Father   . COPD Father   . Cancer Maternal Grandmother    Social History  Substance Use Topics  . Smoking status: Never Smoker   . Smokeless tobacco: Never Used  . Alcohol Use: No   OB History    No data available     Review of Systems  Constitutional: Negative for fever and chills.  HENT: Negative for congestion.   Eyes: Negative for photophobia.  Respiratory: Negative for cough and shortness of breath.   Cardiovascular: Negative for chest pain.  Gastrointestinal: Positive for diarrhea. Negative for nausea, vomiting and abdominal pain.  Genitourinary: Negative for dysuria.  Musculoskeletal: Negative for back pain and neck pain.  Neurological: Negative for dizziness and speech difficulty.  Psychiatric/Behavioral: Positive for suicidal ideas and sleep disturbance. Negative for self-injury.   Allergies  Tramadol  Home Medications   Prior to Admission medications   Medication Sig Start Date  End Date Taking? Authorizing Provider  alprazolam Prudy Feeler) 2 MG tablet Take 2 mg by mouth 3 (three) times daily as needed for sleep or anxiety.  06/09/15  Yes Historical Provider, MD  cetirizine (ZYRTEC) 10 MG tablet Take 1 tablet (10 mg total) by mouth at bedtime. Patient taking differently: Take 10 mg by mouth at bedtime as needed for allergies.  03/05/15  Yes [name], MD  cholecalciferol (VITAMIN D) 1000 UNITS tablet Take 1 tablet (1,000 Units total) by mouth daily  at 12 noon. 12/05/14  Yes Adonis Brook, NP  DULoxetine (CYMBALTA) 60 MG capsule Take 1 capsule (60 mg total) by mouth daily. 06/06/15  Yes [name], MD  furosemide (LASIX) 40 MG tablet Take 1 tablet (40 mg total) by mouth daily. 05/08/15  Yes [name], MD  gabapentin (NEURONTIN) 300 MG capsule Take 2 capsules (600 mg total) by mouth 2 (two) times daily. 05/08/15  Yes [name], MD  hydrOXYzine (ATARAX/VISTARIL) 50 MG tablet Take 1 tablet (50 mg total) by mouth every 6 (six) hours as needed. 06/06/15  Yes [name], MD  levothyroxine (SYNTHROID, LEVOTHROID) 50 MCG tablet TAKE 1 TABLET BY MOUTH EVERY DAY... MAX OF 30 DAYS ON INSURANCE 05/08/15  Yes [name], MD  lidocaine (LIDODERM) 5 % PLACE 1 PATCH ONTO THE SKIN DAILY. REMOVE & DISCARD PATCH WITHIN 12 HOURS OR AS DIRECTED BY MD 04/12/15  Yes Historical Provider, MD  potassium chloride SA (KLOR-CON M20) 20 MEQ tablet TAKE 1 TABLET (20 MEQ TOTAL) BY MOUTH 2 (TWO) TIMES DAILY. 05/08/15  Yes [name], MD  topiramate (TOPAMAX) 100 MG tablet TAKE 1/2 TABLET BY MOUTH IN THE MORNING AND 1 TAB AT BEDTIME 06/06/15  Yes [name], MD  butalbital-acetaminophen-caffeine (FIORICET) [phone] MG tablet Take 1-2 tablets by mouth every 6 (six) hours as needed for headache. 04/14/15 04/13/16  [name], MD  dicyclomine (BENTYL) 20 MG tablet Take 1 tablet (20 mg total) by mouth 4 (four) times daily -  before meals and at bedtime. As needed for cramping irritable bowels 06/06/15   [name], MD  diphenoxylate-atropine (LOMOTIL) 2.5-0.025 MG tablet Take 1 tablet by mouth 4 (four) times daily as needed for diarrhea or loose stools. 06/06/15   [name], MD  rizatriptan (MAXALT) 10 MG tablet Take 0.5 tablets (5 mg total) by mouth as needed for migraine. May repeat in 2 hours if needed 01/17/15   [name], MD   BP 122/71 mmHg  Pulse 79  Temp(Src) 97.5 F (36.4 C) (Oral)  Resp 18  SpO2 98% Physical Exam  Constitutional: She appears well-developed and  well-nourished.  HENT:  Head: Normocephalic and atraumatic.  Eyes: EOM are normal.  Neck: Normal range of motion. Neck supple.  Cardiovascular: Normal rate, regular rhythm and normal heart sounds.  Exam reveals no gallop and no friction rub.   No murmur heard. Pulmonary/Chest: Effort normal and breath sounds normal. No respiratory distress.  Abdominal: Soft. She exhibits no distension. There is no tenderness.  Musculoskeletal: Normal range of motion.  Neurological:  Alert and oriented, able to give a coherent history. Speech is clear and goal oriented, able to follow commands.   Skin: Skin is warm and dry.  Psychiatric:  Depressed mood Thoughts of SI  Nursing note and vitals reviewed.   ED Course  Procedures (including critical care time) DIAGNOSTIC STUDIES: Oxygen Saturation is 98% on RA, normal by my interpretation.  COORDINATION OF CARE: 4:45 PM-Discussed treatment plan which includes labs with pt at bedside and pt agreed to plan.    Labs Review Labs Reviewed  COMPREHENSIVE METABOLIC PANEL - Abnormal; Notable for the following:    Glucose, Bld 122 (*)    Creatinine, Ser 1.45 (*)    GFR calc non Af Amer 38 (*)    GFR calc Af Amer 44 (*)    All other components within normal limits  ACETAMINOPHEN LEVEL - Abnormal; Notable for the following:    Acetaminophen (Tylenol), Serum <10 (*)    All other components within normal limits  CBC - Abnormal; Notable for the following:    RBC 5.35 (*)    MCH 25.0 (*)    All other components within normal limits  URINE RAPID DRUG SCREEN, HOSP PERFORMED - Abnormal; Notable for the following:    Benzodiazepines POSITIVE (*)    All other components within normal limits  ETHANOL  SALICYLATE LEVEL  TSH    Imaging Review No results found. I have personally reviewed and evaluated these images and lab results as part of my medical decision-making.   EKG Interpretation None      MDM   Final diagnoses:  None   Jennifer Davidson  presents to the ER from PCP for MDD with SI. Hx of the same in July 2016. Patient started on  xanax nightly for sleep aid 6 days ago. Per husband, patient has become worse since starting this medication. For example, now speaking to deceased relatives and having conversations with herself, however no improvement in sleep. Will check basic labs. Hx of hypothyroidism, so will add TSH. Patient will need TTS consult.  5:50 PM - Labs reviewed and are reassuring. Daughter at bedside. Patient and daughter updated on lab results and plan. TTS consulted.  I personally performed the services described in this documentation, which was scribed in my presence. The recorded information has been reviewed and is accurate.  [facility], [name] 06/13/15 2015  Raeford Razor, MD 06/18/15 [id number]

## 2015-06-13 NOTE — ED Notes (Signed)
Pt reports SI, no plan. Pt has been treated for depression. Husband states "they messed her meds up." No HI. Only change in family situation is her brother moved.

## 2015-06-13 NOTE — ED Notes (Signed)
Pt daughter does have her belonging
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
## 2015-06-14 DIAGNOSIS — F411 Generalized anxiety disorder: Secondary | ICD-10-CM | POA: Diagnosis present

## 2015-06-14 MED ORDER — FUROSEMIDE 40 MG PO TABS: 40.0000 mg | ORAL_TABLET | Freq: Every day | ORAL | Status: DC

## 2015-06-14 MED ORDER — LEVOTHYROXINE SODIUM 50 MCG PO TABS
50.0000 ug | ORAL_TABLET | Freq: Every day | ORAL | Status: DC
  Administered 2015-06-14: 50 ug via ORAL
  Filled 2015-06-14 (×2): qty 1

## 2015-06-14 MED ORDER — DULOXETINE HCL 60 MG PO CPEP
60.0000 mg | ORAL_CAPSULE | Freq: Every day | ORAL | Status: DC
  Administered 2015-06-14: 60 mg via ORAL
  Filled 2015-06-14: qty 1

## 2015-06-14 MED ORDER — FUROSEMIDE 40 MG PO TABS
20.0000 mg | ORAL_TABLET | Freq: Every day | ORAL | Status: DC
  Administered 2015-06-14: 20 mg via ORAL
  Filled 2015-06-14: qty 1

## 2015-06-14 MED ORDER — QUETIAPINE FUMARATE 25 MG PO TABS: 25.0000 mg | ORAL_TABLET | Freq: Every day | ORAL | Status: DC

## 2015-06-14 MED ORDER — TOPIRAMATE 100 MG PO TABS: 100.0000 mg | ORAL_TABLET | Freq: Every day | ORAL | Status: DC

## 2015-06-14 MED ORDER — TOPIRAMATE 25 MG PO TABS
50.0000 mg | ORAL_TABLET | Freq: Every day | ORAL | Status: DC
  Administered 2015-06-14: 50 mg via ORAL
  Filled 2015-06-14: qty 2

## 2015-06-14 MED ORDER — GABAPENTIN 300 MG PO CAPS
600.0000 mg | ORAL_CAPSULE | Freq: Two times a day (BID) | ORAL | Status: DC
  Administered 2015-06-14: 600 mg via ORAL
  Filled 2015-06-14: qty 2

## 2015-06-14 NOTE — ED Notes (Signed)
Awake. Verbally responsive. A/O x4. Resp even and unlabored. No audible adventitious breath sounds noted. ABC's intact. No behavior problems noted. Pt refused shower stating that she will take one when she gets home. Family and sitter at bedside.

## 2015-06-14 NOTE — BHH Suicide Risk Assessment (Signed)
Suicide Risk Assessment  Discharge Assessment   [facility] Discharge Suicide Risk Assessment   Principal Problem: Generalized anxiety disorder Discharge Diagnoses:  Patient Active Problem List   Diagnosis Date Noted  . Generalized anxiety disorder [F41.1] 06/14/2015    Priority: High  . Chronic pain of right lower extremity [M79.604, G89.29] 05/14/2015  . Neuropathy of lower extremity [G57.90] 05/14/2015  . Headache, migraine [G43.909] 01/17/2015  . Severe recurrent major depressive disorder with psychotic features (HCC) [F33.3]   . Major depression, recurrent, chronic (HCC) [F33.9] 12/02/2014  . Mood disorder (HCC) [F39] 12/01/2014  . Chronic back pain [M54.9, G89.29]   . Hyperparathyroidism, secondary renal (HCC) [N25.81] 07/31/2014  . Neuromuscular disorder (HCC) [G70.9] 07/31/2014  . Lower back pain [M54.5] 09/21/2013  . Diastolic heart failure (HCC) [I50.30] 03/17/2012  . Syncope and collapse [R55] 02/12/2012  . Hypertension [I10] 02/04/2012  . Chronic kidney disease [N18.9] 02/04/2012  . Edema [R60.9] 02/04/2012  . Tachycardia [R00.0] 02/04/2012  . Encounter for long-term (current) use of medications [Z79.899] 02/04/2012  . Anxiety and depression [F41.8] 02/04/2012  . Hypothyroid [E03.9] 02/04/2012  . Hyperlipidemia [E78.5] 02/04/2012    Total Time spent with patient: 45 minutes  Musculoskeletal: Strength & Muscle Tone: within normal limits Gait & Station: normal Patient leans: N/A  Psychiatric Specialty Exam: Review of Systems  Constitutional: Negative.   HENT: Negative.   Eyes: Negative.   Respiratory: Negative.   Cardiovascular: Negative.   Gastrointestinal: Negative.   Genitourinary: Negative.   Musculoskeletal: Negative.   Skin: Negative.   Neurological: Negative.   Endo/Heme/Allergies: Negative.   Psychiatric/Behavioral: The patient is nervous/anxious and has insomnia.     Blood pressure 98/48, pulse 82, temperature 98.1 F (36.7 C), temperature source  Oral, resp. rate 16, SpO2 96 %.There is no weight on file to calculate BMI.  General Appearance: Casual  Eye Contact::  Good  Speech:  Normal Rate  Volume:  Normal  Mood:  Anxious, mild  Affect:  Congruent  Thought Process:  Coherent  Orientation:  Full (Time, Place, and Person)  Thought Content:  WDL  Suicidal Thoughts:  No  Homicidal Thoughts:  No  Memory:  Immediate;   Good Recent;   Good Remote;   Good  Judgement:  Fair  Insight:  Good  Psychomotor Activity:  Normal  Concentration:  Good  Recall:  Good  Fund of Knowledge: Good  Language: Good  Akathisia:  No  Handed:  Right  AIMS (if indicated):     Assets:  Housing Intimacy Leisure Time Resilience Social Support  ADL's:  Intact  Cognition: WNL  Sleep:       Mental Status Per Nursing Assessment::   On Admission:   Anxiety  Demographic Factors:  NA  Loss Factors: NA  Historical Factors: NA  Risk Reduction Factors:   Sense of responsibility to family, Living with another person, especially a relative and Positive social support  Continued Clinical Symptoms:  Insomnia, mild anxiety  Cognitive Features That Contribute To Risk:  None    Suicide Risk:  Minimal: No identifiable suicidal ideation.  Patients presenting with no risk factors but with morbid ruminations; may be classified as minimal risk based on the severity of the depressive symptoms    Plan Of Care/Follow-up recommendations:  Activity:  as tolerated Diet:  heart healthy diet  Beuford Garcilazo, NP 06/14/2015, 5:21 PM

## 2015-06-14 NOTE — ED Notes (Signed)
Awake. Verbally responsive. A/O x4. Resp even and unlabored. No audible adventitious breath sounds noted. ABC's intact. Psych and NP at bedside. Sitter at bedside.

## 2015-06-14 NOTE — Clinical Social Work Note (Signed)
CSW met with pt and her husband at bedside to discuss discharge plans and services.  CSW prompted pt to discuss history and needs.  CSW provided pt with a list of counselors in her area that will take her [employer].  Case manager has also  been referred to possible help with medications that have increased in price.  [place]

## 2015-06-14 NOTE — ED Notes (Signed)
Awake. Verbally responsive. A/O x4. Resp even and unlabored. No audible adventitious breath sounds noted. ABC's intact.  

## 2015-06-14 NOTE — BHH Counselor (Signed)
Dr. Lolly Mustache has recommended patient be discharged with outpatient resources at this time. Spoke with patient regarding discharge instructions and provided a list of outpatient resources. Encouraged the patient to call places located on thee resource list and request that she contact the organizations and ask about sliding scale or scholarship programs to cover cost of outpatient treatment. Patient states that she is on a fixed income and she spends most of her money on medications.  Informed her that this writer will contact EDCM to speak with patient regarding options for reducing cost of medications if possible.   Provided support to patient on how to navigate the process and spoke with CSW who will speak with the patient regarding additional resources for locating outpatient services with [facility] Medicare due to the extra cost associated with that insurance.  Marland Kitchen   Spoke with Psychiatric NP regarding patient concerns and requested patient have a [facility] consult to speak with her about any options available to reduce costs of medications.    Patient is agreeable to following up with resources and states that she will contact [facility] regarding scholarship information for outpatient services. She states that she will call around for various prices and speak with her husband regarding the best options for their budget. Informed the patient that psychiatric treatment appointments generally will taper down to once every three months once she is stabilized and the cost will be reduced since the frequency of appointments will be reduced after a few months.     Davina Poke, LCSW Therapeutic Triage Specialist [facility] 06/14/2015 12:28 PM

## 2015-06-14 NOTE — ED Notes (Signed)
Resting quietly with eye closed. Easily arousable. Verbally responsive. Resp even and unlabored. ABC's intact. No behavior problems noted. NAD noted. Sitter at bedside.  

## 2015-06-14 NOTE — Care Management Note (Signed)
Case Management Note  Patient Details  Name: Jennifer Davidson MRN: [id number] Date of Birth: 1955/08/22  Subjective/Objective:             Generalized anxiety disorder       Action/Plan: NCM spoke to pt and dtr at bedside. Pt states she pays over $700 for medication each month. Provided pt info on [place] SHIIP (program designed to assist Medicare patient with copay cost) program also info about goodrx. Printed out coupons for medications so patient can prices and discuss with her physician about alternative generic medications that maybe cheaper at her pharmacy. Pt states she is on a fixed income of about $800 per month. Husband at home to assist with her care.    Expected Discharge Date:  06/14/2015              Expected Discharge Plan:  Home/Self Care  In-House Referral:  Clinical Social Work  Discharge planning Services  CM Consult, Medication Assistance  Post Acute Care Choice:  NA Choice offered to:  NA  DME Arranged:  N/A DME Agency:  NA  HH Arranged:  NA HH Agency:  NA  Status of Service:  Completed, signed off  Medicare Important Message Given:    Date Medicare IM Given:    Medicare IM give by:    Date Additional Medicare IM Given:    Additional Medicare Important Message give by:     If discussed at Long Length of Stay Meetings, dates discussed:    Additional Comments:  Elliot Cousin, RN 06/14/2015, 5:40 PM

## 2015-06-14 NOTE — ED Notes (Signed)
Awake. Verbally responsive. A/O x4. Resp even and unlabored. No audible adventitious breath sounds noted. ABC's intact. Family and sitter at bedside. Pt refused lunch.

## 2015-06-14 NOTE — Consult Note (Signed)
[place] Psychiatry Consult   Reason for Consult:  Anxiety, insomnia Referring Physician:  EDP Patient Identification: Jennifer Davidson MRN:  [id number] Principal Diagnosis: Generalized anxiety disorder Diagnosis:   Patient Active Problem List   Diagnosis Date Noted  . Generalized anxiety disorder [F41.1] 06/14/2015    Priority: High  . Chronic pain of right lower extremity [M79.604, G89.29] 05/14/2015  . Neuropathy of lower extremity [G57.90] 05/14/2015  . Headache, migraine [G43.909] 01/17/2015  . Severe recurrent major depressive disorder with psychotic features ([place]) [F33.3]   . Major depression, recurrent, chronic ([place]) [F33.9] 12/02/2014  . Mood disorder ([place]) [F39] 12/01/2014  . Chronic back pain [M54.9, G89.29]   . Hyperparathyroidism, secondary renal ([place]) [N25.81] 07/31/2014  . Neuromuscular disorder ([place]) [G70.9] 07/31/2014  . Lower back pain [M54.5] 09/21/2013  . Diastolic heart failure ([place]) [I50.30] 03/17/2012  . Syncope and collapse [R55] 02/12/2012  . Hypertension [I10] 02/04/2012  . Chronic kidney disease [N18.9] 02/04/2012  . Edema [R60.9] 02/04/2012  . Tachycardia [R00.0] 02/04/2012  . Encounter for long-term (current) use of medications [Z79.899] 02/04/2012  . Anxiety and depression [F41.8] 02/04/2012  . Hypothyroid [E03.9] 02/04/2012  . Hyperlipidemia [E78.5] 02/04/2012    Total Time spent with patient: 45 minutes  Subjective:   Jennifer Davidson is a 60 y.o. female patient is stable for discharge  HPI:  On admission:  60 y.o. female that presents this date with her daughter stating she has been very disorganized the last three days. Patient was very tearful on presentation stating, "I have been feeling so bad for the last three weeks." Patient stated that she has seen her primary care M.D. (Dr. Brigitte Pulse) who diagnosed her with IBS, although she feels the medications are not working as she continues to have stomach problems and diarrhea. Patient also reports on  going depression and states she has been receiving medications also from Dr. Brigitte Pulse at [facility]. Patient states she has had a previous admission at [facility] in July of 2016 where she was admitted for 5 days to address issues associated with her depression and anxiety. Patient denies any S/I but states "she just gets tired of being here." Patient currently denies any thought of self harm or plan but does rate her depression at an 8. Patient also reported she has been prescribed Cymbalta reporting current compliance. Patient also states that her M.D. has started her on Xanax 33m daily approxiametly three days ago and "has not felt right since." Patient reports talking to realitives she thinks are in her room with collateral from daughter stating she has been very confudsed the last few days.patient has no previous history of self harm of substance abuse. No homicidal ideations. Associated symptoms include decreased appetite and reports not sleeping more than 1 or 2 hours a night. Case was staffed with JGust RungPMH-NT who stated patient will be held overnight and re-evaluated in the morning. Patient was consulted and agreed stating she feels she is having adverse affects from the Xanax.   Today:  Patient denies suicidal/homicidal ideations, hallucinations, and alcohol/drug abuse.  Insomnia reported with Xanax prescribed 3 days ago with an increase in symptoms.  Xanax not continued here.  Patient does not want inpatient hospitalization, wants therapy and psychiatry management in outpatient, resources provided along with a Care Management Consult for medication costs.  Seroquel prescribed for sleep issues.  Past Psychiatric History: Depression, anxiety  Risk to Self: Suicidal Ideation: No Suicidal Intent: No Is patient at risk for suicide?: No  Suicidal Plan?: No Access to Means: No What has been your use of drugs/alcohol within the last 12 months?: Denies How many times?: 0 Other Self Harm  Risks: None noted Triggers for Past Attempts: Unknown Intentional Self Injurious Behavior: None Risk to Others: Homicidal Ideation: No Thoughts of Harm to Others: No Current Homicidal Intent: No Current Homicidal Plan: No Access to Homicidal Means: No Identified Victim: None History of harm to others?: No Assessment of Violence: In distant past Violent Behavior Description: NA Does patient have access to weapons?: No Criminal Charges Pending?: No Does patient have a court date: No Prior Inpatient Therapy: Prior Inpatient Therapy: Yes Prior Therapy Dates: 2016 Prior Therapy Facilty/Provider(s): [facility] Reason for Treatment: Depression, Anxiety Prior Outpatient Therapy: Prior Outpatient Therapy: No Prior Therapy Dates: None Prior Therapy Facilty/Provider(s): None Reason for Treatment: None Does patient have an ACCT team?: No Does patient have Intensive In-House Services?  : No Does patient have [facility] services? : No Does patient have P4CC services?: No  Past Medical History:  Past Medical History  Diagnosis Date  . Hypertension   . Cholesterol serum increased   . Hypothyroidism   . Sleep apnea   . Migraine   . Depression   . Neuromuscular disorder ([place])   . GERD (gastroesophageal reflux disease)   . Anxiety   . Chronic back pain   . Chronic kidney disease   . Tachycardia   . Hyperparathyroidism ([place])   . Diastolic heart failure [facility])     Past Surgical History  Procedure Laterality Date  . Abdominal hysterectomy    . Back surgery    . Appendectomy     Family History:  Family History  Problem Relation Age of Onset  . Diabetes Mother   . Heart disease Mother   . Kidney disease Mother   . Thyroid disease Mother   . Heart disease Father   . Seizures Father   . COPD Father   . Cancer Maternal Grandmother    Family Psychiatric  History: NOne Social History:  History  Alcohol Use No     History  Drug Use No    Social History   Social History  . Marital  Status: Married    Spouse Name: N/A  . Number of Children: N/A  . Years of Education: N/A   Social History Main Topics  . Smoking status: Never Smoker   . Smokeless tobacco: Never Used  . Alcohol Use: No  . Drug Use: No  . Sexual Activity:    Partners: Male     Comment: married   Other Topics Concern  . None   Social History Narrative   Additional Social History:    Pain Medications: See MAR Prescriptions: See MAR Over the Counter: See MAR History of alcohol / drug use?: No history of alcohol / drug abuse                     Allergies:   Allergies  Allergen Reactions  . Tramadol Nausea And Vomiting    Labs:  Results for orders placed or performed during the hospital encounter of 06/13/15 (from the past 48 hour(s))  Comprehensive metabolic panel     Status: Abnormal   Collection Time: 06/13/15  4:46 PM  Result Value Ref Range   Sodium 137 135 - 145 mmol/L   Potassium 3.7 3.5 - 5.1 mmol/L   Chloride 103 101 - 111 mmol/L   CO2 26 22 - 32 mmol/L   Glucose, Bld  122 (H) 65 - 99 mg/dL   BUN 13 6 - 20 mg/dL   Creatinine, Ser 1.45 (H) 0.44 - 1.00 mg/dL   Calcium 9.4 8.9 - 10.3 mg/dL   Total Protein 7.3 6.5 - 8.1 g/dL   Albumin 4.1 3.5 - 5.0 g/dL   AST 27 15 - 41 U/L   ALT 21 14 - 54 U/L   Alkaline Phosphatase 79 38 - 126 U/L   Total Bilirubin 0.5 0.3 - 1.2 mg/dL   GFR calc non Af Amer 38 (L) >60 mL/min   GFR calc Af Amer 44 (L) >60 mL/min    Comment: (NOTE) The eGFR has been calculated using the CKD EPI equation. This calculation has not been validated in all clinical situations. eGFR's persistently <60 mL/min signify possible Chronic Kidney Disease.    Anion gap 8 5 - 15  Ethanol (ETOH)     Status: None   Collection Time: 06/13/15  4:46 PM  Result Value Ref Range   Alcohol, Ethyl (B) <5 <5 mg/dL    Comment:        LOWEST DETECTABLE LIMIT FOR SERUM ALCOHOL IS 5 mg/dL FOR MEDICAL PURPOSES ONLY   Salicylate level     Status: None   Collection Time:  06/13/15  4:46 PM  Result Value Ref Range   Salicylate Lvl <8.1 2.8 - 30.0 mg/dL  Acetaminophen level     Status: Abnormal   Collection Time: 06/13/15  4:46 PM  Result Value Ref Range   Acetaminophen (Tylenol), Serum <10 (L) 10 - 30 ug/mL    Comment:        THERAPEUTIC CONCENTRATIONS VARY SIGNIFICANTLY. A RANGE OF 10-30 ug/mL MAY BE AN EFFECTIVE CONCENTRATION FOR MANY PATIENTS. HOWEVER, SOME ARE BEST TREATED AT CONCENTRATIONS OUTSIDE THIS RANGE. ACETAMINOPHEN CONCENTRATIONS >150 ug/mL AT 4 HOURS AFTER INGESTION AND >50 ug/mL AT 12 HOURS AFTER INGESTION ARE OFTEN ASSOCIATED WITH TOXIC REACTIONS.   CBC     Status: Abnormal   Collection Time: 06/13/15  4:46 PM  Result Value Ref Range   WBC 8.5 4.0 - 10.5 K/uL   RBC 5.35 (H) 3.87 - 5.11 MIL/uL   Hemoglobin 13.4 12.0 - 15.0 g/dL   HCT 43.1 36.0 - 46.0 %   MCV 80.6 78.0 - 100.0 fL   MCH 25.0 (L) 26.0 - 34.0 pg   MCHC 31.1 30.0 - 36.0 g/dL   RDW 14.2 11.5 - 15.5 %   Platelets 217 150 - 400 K/uL  Urine rapid drug screen (hosp performed) (Not at [facility])     Status: Abnormal   Collection Time: 06/13/15  5:11 PM  Result Value Ref Range   Opiates NONE DETECTED NONE DETECTED   Cocaine NONE DETECTED NONE DETECTED   Benzodiazepines POSITIVE (A) NONE DETECTED   Amphetamines NONE DETECTED NONE DETECTED   Tetrahydrocannabinol NONE DETECTED NONE DETECTED   Barbiturates NONE DETECTED NONE DETECTED    Comment:        DRUG SCREEN FOR MEDICAL PURPOSES ONLY.  IF CONFIRMATION IS NEEDED FOR ANY PURPOSE, NOTIFY LAB WITHIN 5 DAYS.        LOWEST DETECTABLE LIMITS FOR URINE DRUG SCREEN Drug Class       Cutoff (ng/mL) Amphetamine      1000 Barbiturate      200 Benzodiazepine   191 Tricyclics       478 Opiates          300 Cocaine          300 THC  50   TSH     Status: None   Collection Time: 06/13/15  5:13 PM  Result Value Ref Range   TSH 1.737 0.350 - 4.500 uIU/mL    Current Facility-Administered Medications  Medication  Dose Route Frequency Provider Last Rate Last Dose  . DULoxetine (CYMBALTA) DR capsule 60 mg  60 mg Oral Daily Deno Etienne, DO   60 mg at 06/14/15 0931  . furosemide (LASIX) tablet 20 mg  20 mg Oral Daily Patrecia Pour, NP   20 mg at 06/14/15 0932  . gabapentin (NEURONTIN) capsule 600 mg  600 mg Oral BID Deno Etienne, DO   600 mg at 06/14/15 0932  . levothyroxine (SYNTHROID, LEVOTHROID) tablet 50 mcg  50 mcg Oral QAC breakfast Deno Etienne, DO   50 mcg at 06/14/15 0931  . QUEtiapine (SEROQUEL) tablet 25 mg  25 mg Oral QHS Patrecia Pour, NP      . topiramate (TOPAMAX) tablet 100 mg  100 mg Oral QHS Deno Etienne, DO      . topiramate (TOPAMAX) tablet 50 mg  50 mg Oral Daily Deno Etienne, DO   50 mg at 06/14/15 8003   Current Outpatient Prescriptions  Medication Sig Dispense Refill  . alprazolam (XANAX) 2 MG tablet Take 2 mg by mouth 3 (three) times daily as needed for sleep or anxiety.     . cetirizine (ZYRTEC) 10 MG tablet Take 1 tablet (10 mg total) by mouth at bedtime. (Patient taking differently: Take 10 mg by mouth at bedtime as needed for allergies. ) 90 tablet 3  . cholecalciferol (VITAMIN D) 1000 UNITS tablet Take 1 tablet (1,000 Units total) by mouth daily at 12 noon. 30 tablet 0  . DULoxetine (CYMBALTA) 60 MG capsule Take 1 capsule (60 mg total) by mouth daily. 90 capsule 3  . furosemide (LASIX) 40 MG tablet Take 1 tablet (40 mg total) by mouth daily. 90 tablet 1  . gabapentin (NEURONTIN) 300 MG capsule Take 2 capsules (600 mg total) by mouth 2 (two) times daily. 360 capsule 3  . hydrOXYzine (ATARAX/VISTARIL) 50 MG tablet Take 1 tablet (50 mg total) by mouth every 6 (six) hours as needed. 360 tablet 1  . levothyroxine (SYNTHROID, LEVOTHROID) 50 MCG tablet TAKE 1 TABLET BY MOUTH EVERY DAY... MAX OF 30 DAYS ON INSURANCE 90 tablet 1  . lidocaine (LIDODERM) 5 % PLACE 1 PATCH ONTO THE SKIN DAILY. REMOVE & DISCARD PATCH WITHIN 12 HOURS OR AS DIRECTED BY MD  11  . potassium chloride SA (KLOR-CON M20) 20 MEQ  tablet TAKE 1 TABLET (20 MEQ TOTAL) BY MOUTH 2 (TWO) TIMES DAILY. 180 tablet 3  . topiramate (TOPAMAX) 100 MG tablet TAKE 1/2 TABLET BY MOUTH IN THE MORNING AND 1 TAB AT BEDTIME 135 tablet 1  . butalbital-acetaminophen-caffeine (FIORICET) 50-325-40 MG tablet Take 1-2 tablets by mouth every 6 (six) hours as needed for headache. 20 tablet 5  . dicyclomine (BENTYL) 20 MG tablet Take 1 tablet (20 mg total) by mouth 4 (four) times daily -  before meals and at bedtime. As needed for cramping irritable bowels 60 tablet 0  . diphenoxylate-atropine (LOMOTIL) 2.5-0.025 MG tablet Take 1 tablet by mouth 4 (four) times daily as needed for diarrhea or loose stools. 30 tablet 0  . rizatriptan (MAXALT) 10 MG tablet Take 0.5 tablets (5 mg total) by mouth as needed for migraine. May repeat in 2 hours if needed 10 tablet 5    Musculoskeletal: Strength & Muscle Tone: within  normal limits Gait & Station: normal Patient leans: N/A  Psychiatric Specialty Exam: Review of Systems  Constitutional: Negative.   HENT: Negative.   Eyes: Negative.   Respiratory: Negative.   Cardiovascular: Negative.   Gastrointestinal: Negative.   Genitourinary: Negative.   Musculoskeletal: Negative.   Skin: Negative.   Neurological: Negative.   Endo/Heme/Allergies: Negative.   Psychiatric/Behavioral: The patient is nervous/anxious and has insomnia.     Blood pressure 98/48, pulse 82, temperature 98.1 F (36.7 C), temperature source Oral, resp. rate 16, SpO2 96 %.There is no weight on file to calculate BMI.  General Appearance: Casual  Eye Contact::  Good  Speech:  Normal Rate  Volume:  Normal  Mood:  Anxious, mild  Affect:  Congruent  Thought Process:  Coherent  Orientation:  Full (Time, Place, and Person)  Thought Content:  WDL  Suicidal Thoughts:  No  Homicidal Thoughts:  No  Memory:  Immediate;   Good Recent;   Good Remote;   Good  Judgement:  Fair  Insight:  Good  Psychomotor Activity:  Normal  Concentration:   Good  Recall:  Good  Fund of Knowledge: Good  Language: Good  Akathisia:  No  Handed:  Right  AIMS (if indicated):     Assets:  Housing Intimacy Leisure Time Resilience Social Support  ADL's:  Intact  Cognition: WNL  Sleep:      Treatment Plan Summary: Daily contact with patient to assess and evaluate symptoms and progress in treatment, Medication management and Plan General anxiety disorder with insomnia: -Crisis stabilization -Medication management:  Home medical medications continued along with Cymbalta 60 mg daily for depression, Seroquel 25 mg at bedtime for sleep issues started -Individual and family counseling -Care management referral -Outpatient resources -Rx for Seroquel 25 mg at bedtime for sleep issues provided  Disposition: No evidence of imminent risk to self or others at present.    Waylan Boga, [place] 06/14/2015 12:13 PM Patient seen face to face for psychiatric evaluation. Chart reviewed and finding discussed with Physician extender. Agreed with disposition and treatment plan.   Berniece Andreas, MD

## 2015-06-14 NOTE — ED Notes (Signed)
Case Manager at bedside to discuss resources with pt and family.

## 2015-06-14 NOTE — ED Notes (Signed)
Awake. Verbally responsive. A/O x4. Resp even and unlabored. No audible adventitious breath sounds noted. ABC's intact. No behavior problems noted. Family at bedside. 

## 2015-06-14 NOTE — ED Notes (Signed)
Awake. Verbally responsive. A/O x4. Resp even and unlabored. No audible adventitious breath sounds noted. ABC's intact. Pt eaten 100% of breakfast. Sitter at bedside. 

## 2015-06-14 NOTE — ED Notes (Signed)
Resting quietly with eye closed. Easily arousable. Verbally responsive. Resp even and unlabored. ABC's intact. No behavior problems noted. Pt denies SI/HI and audilble/visual hallucinations. NAD noted. Sitter at bedside.  

## 2015-07-03 ENCOUNTER — Telehealth: Payer: Self-pay

## 2015-07-03 NOTE — Telephone Encounter (Signed)
[facility] called for a referral on this patient for back and right hip pain.   Dr. [name]    Scheduled with Dr. Meredith Leeds on Jul 07, 2015  Lala Lund phone [phone]

## 2015-07-04 NOTE — Telephone Encounter (Signed)
Great. Yes, please refer to ortho for this. Thanks so much

## 2015-07-07 DIAGNOSIS — M5416 Radiculopathy, lumbar region: Secondary | ICD-10-CM | POA: Diagnosis not present

## 2015-07-12 DIAGNOSIS — M545 Low back pain: Secondary | ICD-10-CM | POA: Diagnosis not present

## 2015-07-18 DIAGNOSIS — M545 Low back pain: Secondary | ICD-10-CM | POA: Diagnosis not present

## 2015-07-21 DIAGNOSIS — M533 Sacrococcygeal disorders, not elsewhere classified: Secondary | ICD-10-CM | POA: Diagnosis not present

## 2015-07-21 DIAGNOSIS — M47816 Spondylosis without myelopathy or radiculopathy, lumbar region: Secondary | ICD-10-CM | POA: Diagnosis not present

## 2015-07-24 DIAGNOSIS — M5416 Radiculopathy, lumbar region: Secondary | ICD-10-CM | POA: Diagnosis not present

## 2015-07-24 DIAGNOSIS — M545 Low back pain: Secondary | ICD-10-CM | POA: Diagnosis not present

## 2015-08-04 DIAGNOSIS — M545 Low back pain: Secondary | ICD-10-CM | POA: Diagnosis not present

## 2015-08-04 DIAGNOSIS — M5416 Radiculopathy, lumbar region: Secondary | ICD-10-CM | POA: Diagnosis not present

## 2015-08-07 DIAGNOSIS — M47816 Spondylosis without myelopathy or radiculopathy, lumbar region: Secondary | ICD-10-CM | POA: Diagnosis not present

## 2015-08-11 DIAGNOSIS — M545 Low back pain: Secondary | ICD-10-CM | POA: Diagnosis not present

## 2015-08-11 DIAGNOSIS — M5416 Radiculopathy, lumbar region: Secondary | ICD-10-CM | POA: Diagnosis not present

## 2015-08-14 DIAGNOSIS — M5416 Radiculopathy, lumbar region: Secondary | ICD-10-CM | POA: Diagnosis not present

## 2015-08-14 DIAGNOSIS — M545 Low back pain: Secondary | ICD-10-CM | POA: Diagnosis not present

## 2015-08-18 DIAGNOSIS — M5416 Radiculopathy, lumbar region: Secondary | ICD-10-CM | POA: Diagnosis not present

## 2015-08-18 DIAGNOSIS — M545 Low back pain: Secondary | ICD-10-CM | POA: Diagnosis not present

## 2015-08-25 ENCOUNTER — Telehealth: Payer: Self-pay

## 2015-08-25 NOTE — Telephone Encounter (Signed)
Patient is calling to request a refill for xanax  [phone]

## 2015-08-26 MED ORDER — ALPRAZOLAM 1 MG PO TABS: 1.0000 mg | ORAL_TABLET | Freq: Two times a day (BID) | ORAL | Status: DC | PRN

## 2015-08-26 NOTE — Telephone Encounter (Signed)
Faxed xanax to mail order.

## 2015-08-26 NOTE — Telephone Encounter (Signed)
Would you please put some calls in and see if we can find a good psychiatrist and therapist who take [employer] [employer] Medicare insurance and are taking new pts?? Pt has reached out multiple times to [place] and [facility] w/o result - this is a really wonderful woman who REALLY needs a understanding and talented mental health provider.  Please help!!!!

## 2015-08-26 NOTE — Telephone Encounter (Deleted)
Is there anyway she could come in for a visit and bring ALL of her medication bottles with her?

## 2015-08-26 NOTE — Telephone Encounter (Signed)
SPoke with pt, she states Dr. Clelia CroftShaw wrote her for Xanax and it has been helping. I was going by the last note from [place]. Dr. Clelia CroftShaw please review.

## 2015-08-26 NOTE — Telephone Encounter (Signed)
Her back pain has been worsening.  She is only taking the gabapentin for pain - 600mg  bid - which is not helping significantly as she has been on it for a very long time - her do Really tired during the day and then gets aggravated as she can't keep her house clean.  At night she takes her nighttime meds, takes a shower, changes in pajamas, takes her dogs out and puts them to bed, then she tries to go to sleep.  She will try to lay down to sleep for 15 to 20 minutes.  Most of the time she will not fall asleep then get Up She wakes the hydroxyzine  She will often take 0.5mg  tab xanax in the afternoon which she gets jittery.  Jennifer Davidson thinks she unintentionally overdosed when she first started it.  She denies any intentional overdose even in hindsight. She hasn't been taking the seroquel as it didn't help at all so no longer aking it. She is having a lot of headaches from worrying but she has had 2 really bad migraines in the past several months with moviting. Still on the cymbalta.  Still has the diarrhea a lot - doesn't know if it is from the amount of pain she is in. She is on [employer] insurance and has been unable to find a psychiatric provider who takes her insurance.

## 2015-08-26 NOTE — Telephone Encounter (Signed)
Dr. Clelia CroftShaw did you need me to do anything with this message? I am not sure if you spoke with pt.

## 2015-08-26 NOTE — Telephone Encounter (Signed)
-   Patient is in severe depressive episode with risk for suicide. I discussed patient's management and need for emergent psych eval which she agreed to. Her husband is also in agreement. For now, patient will stop Xanax since this is very closely associated with her falls. It is likely that she has been over-sedated with using hydroxyzine, gabapentin, Xanax. Patient will report to [facility] for psych consult. She is to stop using Xanax. [facility] is aware that she is on her way.  Wallis BambergMario Mani, [name] [facility] [phone] 06/13/2015 3:05 PM  Left message for pt to call back. Urban GibsonMani decided he will not Rx Xnanx and she is supposed to be under psych care.

## 2015-08-29 NOTE — Telephone Encounter (Signed)
Reached out to [facility] - they will be happy to see pt - has psychology and will prob be able to get her in within the next sev wks. No psychiatry avail.  They will call pt to sched.

## 2015-08-30 ENCOUNTER — Encounter (HOSPITAL_COMMUNITY): Payer: Self-pay

## 2015-08-30 ENCOUNTER — Emergency Department (HOSPITAL_COMMUNITY): Payer: Commercial Managed Care - HMO

## 2015-08-30 ENCOUNTER — Observation Stay (HOSPITAL_COMMUNITY)
Admission: EM | Admit: 2015-08-30 | Discharge: 2015-09-01 | Disposition: A | Discharge status: Home / Self Care | Payer: Commercial Managed Care - HMO | Attending: Internal Medicine | Admitting: Internal Medicine

## 2015-08-30 DIAGNOSIS — I5030 Unspecified diastolic (congestive) heart failure: Secondary | ICD-10-CM | POA: Diagnosis not present

## 2015-08-30 DIAGNOSIS — I110 Hypertensive heart disease with heart failure: Secondary | ICD-10-CM | POA: Insufficient documentation

## 2015-08-30 DIAGNOSIS — F419 Anxiety disorder, unspecified: Secondary | ICD-10-CM

## 2015-08-30 DIAGNOSIS — R55 Syncope and collapse: Secondary | ICD-10-CM | POA: Diagnosis not present

## 2015-08-30 DIAGNOSIS — M549 Dorsalgia, unspecified: Secondary | ICD-10-CM | POA: Diagnosis not present

## 2015-08-30 DIAGNOSIS — E039 Hypothyroidism, unspecified: Secondary | ICD-10-CM | POA: Insufficient documentation

## 2015-08-30 DIAGNOSIS — R404 Transient alteration of awareness: Secondary | ICD-10-CM | POA: Diagnosis not present

## 2015-08-30 DIAGNOSIS — N189 Chronic kidney disease, unspecified: Secondary | ICD-10-CM | POA: Diagnosis present

## 2015-08-30 DIAGNOSIS — Z79899 Other long term (current) drug therapy: Secondary | ICD-10-CM | POA: Diagnosis not present

## 2015-08-30 DIAGNOSIS — F329 Major depressive disorder, single episode, unspecified: Secondary | ICD-10-CM | POA: Diagnosis not present

## 2015-08-30 DIAGNOSIS — I5032 Chronic diastolic (congestive) heart failure: Secondary | ICD-10-CM | POA: Diagnosis not present

## 2015-08-30 DIAGNOSIS — G709 Myoneural disorder, unspecified: Secondary | ICD-10-CM | POA: Diagnosis not present

## 2015-08-30 DIAGNOSIS — I10 Essential (primary) hypertension: Secondary | ICD-10-CM | POA: Diagnosis present

## 2015-08-30 DIAGNOSIS — N1832 Chronic kidney disease, stage 3b: Secondary | ICD-10-CM | POA: Diagnosis present

## 2015-08-30 DIAGNOSIS — F32A Depression, unspecified: Secondary | ICD-10-CM | POA: Diagnosis present

## 2015-08-30 HISTORY — DX: Syncope and collapse: R55

## 2015-08-30 LAB — BASIC METABOLIC PANEL
Anion gap: 9 (ref 5–15)
BUN: 14 mg/dL (ref 6–20)
CHLORIDE: 104 mmol/L (ref 101–111)
CO2: 26 mmol/L (ref 22–32)
Calcium: 9 mg/dL (ref 8.9–10.3)
Creatinine, Ser: 1.44 mg/dL — ABNORMAL HIGH (ref 0.44–1.00)
GFR calc Af Amer: 44 mL/min — ABNORMAL LOW (ref >60)
GFR, EST NON AFRICAN AMERICAN: 38 mL/min — AB (ref >60)
GLUCOSE: 102 mg/dL — AB (ref 65–99)
POTASSIUM: 3.7 mmol/L (ref 3.5–5.1)
Sodium: 139 mmol/L (ref 135–145)

## 2015-08-30 LAB — CBC WITH DIFFERENTIAL/PLATELET
Basophils Absolute: 0.1 10*3/uL (ref 0.0–0.1)
Basophils Relative: 1 %
EOS PCT: 4 %
Eosinophils Absolute: 0.4 10*3/uL (ref 0.0–0.7)
HCT: 40.7 % (ref 36.0–46.0)
Hemoglobin: 13.1 g/dL (ref 12.0–15.0)
LYMPHS ABS: 3 10*3/uL (ref 0.7–4.0)
LYMPHS PCT: 35 %
MCH: 25.2 pg — AB (ref 26.0–34.0)
MCHC: 32.2 g/dL (ref 30.0–36.0)
MCV: 78.4 fL (ref 78.0–100.0)
MONO ABS: 0.6 10*3/uL (ref 0.1–1.0)
Monocytes Relative: 7 %
Neutro Abs: 4.5 10*3/uL (ref 1.7–7.7)
Neutrophils Relative %: 53 %
PLATELETS: 234 10*3/uL (ref 150–400)
RBC: 5.19 MIL/uL — AB (ref 3.87–5.11)
RDW: 15.2 % (ref 11.5–15.5)
WBC: 8.5 10*3/uL (ref 4.0–10.5)

## 2015-08-30 LAB — URINALYSIS, ROUTINE W REFLEX MICROSCOPIC
BILIRUBIN URINE: NEGATIVE
Glucose, UA: NEGATIVE mg/dL
HGB URINE DIPSTICK: NEGATIVE
Ketones, ur: NEGATIVE mg/dL
Nitrite: NEGATIVE
PH: 6 (ref 5.0–8.0)
Protein, ur: NEGATIVE mg/dL
SPECIFIC GRAVITY, URINE: 1.01 (ref 1.005–1.030)

## 2015-08-30 LAB — TROPONIN I
Troponin I: <0.03 ng/mL (ref <0.031)
Troponin I: <0.03 ng/mL (ref <0.031)

## 2015-08-30 LAB — URINE MICROSCOPIC-ADD ON: Bacteria, UA: NONE SEEN

## 2015-08-30 LAB — CBG MONITORING, ED: Glucose-Capillary: 82 mg/dL (ref 65–99)

## 2015-08-30 MED ORDER — SODIUM CHLORIDE 0.9 % IV SOLN
INTRAVENOUS | Status: AC
  Administered 2015-08-30: 23:00:00 via INTRAVENOUS

## 2015-08-30 MED ORDER — GABAPENTIN 300 MG PO CAPS: 600.0000 mg | ORAL_CAPSULE | Freq: Two times a day (BID) | ORAL | Status: DC

## 2015-08-30 MED ORDER — DULOXETINE HCL 60 MG PO CPEP
60.0000 mg | ORAL_CAPSULE | Freq: Every day | ORAL | Status: DC
  Administered 2015-08-31 – 2015-09-01 (×2): 60 mg via ORAL
  Filled 2015-08-30 (×2): qty 1

## 2015-08-30 MED ORDER — ALPRAZOLAM 1 MG PO TABS
1.0000 mg | ORAL_TABLET | Freq: Two times a day (BID) | ORAL | Status: DC | PRN
  Administered 2015-08-30 – 2015-08-31 (×2): 1 mg via ORAL
  Filled 2015-08-30 (×2): qty 1

## 2015-08-30 MED ORDER — SODIUM CHLORIDE 0.9% FLUSH
3.0000 mL | Freq: Two times a day (BID) | INTRAVENOUS | Status: DC
  Administered 2015-08-30 – 2015-08-31 (×2): 3 mL via INTRAVENOUS

## 2015-08-30 MED ORDER — GABAPENTIN 300 MG PO CAPS
600.0000 mg | ORAL_CAPSULE | Freq: Three times a day (TID) | ORAL | Status: DC
  Administered 2015-08-30 – 2015-09-01 (×5): 600 mg via ORAL
  Filled 2015-08-30 (×5): qty 2

## 2015-08-30 NOTE — ED Notes (Signed)
History of back surgery in 2011 fusion on L 4-5.  currenttly rates pain 9/10 sharp shooting (to right leg).  Pt says pain rates about 7-9 daily and is on Gabapentin 300 mg ( 2 tablets) twice daily.  C/o dizziness after walking up driveway and everything went black.  Pt says she passed out at 0930 this am at home.  Passed out briefly but did not seek treatment.

## 2015-08-30 NOTE — ED Notes (Signed)
Attempted to call report. Was advised nurse receiving pt will call this nurse back for report. 

## 2015-08-30 NOTE — ED Notes (Signed)
Pt ambulated to restroom & returned to room w/ no complications. 

## 2015-08-30 NOTE — H&P (Signed)
PCP:   Norberto Sorenson, MD   Chief Complaint:  Passed out twice  HPI: 60 yo female with h/o htn, diastolic chf, chronic back pain, ckd baseline cr 1.4 comes in after 2 episodes of syncope today.  Pt reports today was a bad day for her back pain.  She has really bad days where her back hurts severely and she has passed out before in the setting of when her pain is high.  She ws walking in her yard today to the end of her driveway and began to walk back, her back pain was severe and she got dizzy and weak and passed out.   Over a year ago she was on multiple pain meds for her back and it got really out of hand which caused her to be hospitalized for psychiatric help.  She was taken off all pain meds at that time, all controlled substances.  So she has been trying to manage her pain since with dr [name] with medications that are less addicting.  She has been on lidocaine patches and neurontin  bid.  She says this really does not help much but she has learned to deal with it.  She is s/p L4/5 fusion and is undergoing evaluation by her orthopedic surgeon for further procedures at other levels of her lower spine.  She chronically has right leg weakness which is mild from her previous surgery in 2011.  She denies any numbness anywhere, no urinary or bowel incontinence.  No fevers.  No worsening weakness to her legs that is new.  She denies any chest pain, no sob.  No leg swelling.  No headache.  No n/v/d.  No recent illnesses.  Of note, pt passed out in her yard today, a neighbor went and started cpr on her for unknown amount of time.  Ems arrived and stopped the cpr as she had a pulse and was moaning.  Pt does not remember this.   She was also recently started on xanax for sleep as she has not been sleeping because of her pain.  Pt referred for admission for syncope.  Review of Systems:  Positive and negative as per HPI otherwise all other systems are negative  Past Medical History: Past Medical History   Diagnosis Date  . Hypertension   . Cholesterol serum increased   . Hypothyroidism   . Sleep apnea   . Migraine   . Depression   . Neuromuscular disorder (HCC)   . GERD (gastroesophageal reflux disease)   . Anxiety   . Chronic back pain   . Chronic kidney disease   . Tachycardia   . Hyperparathyroidism (HCC)   . Diastolic heart failure [facility])    Past Surgical History  Procedure Laterality Date  . Abdominal hysterectomy    . Back surgery    . Appendectomy      Medications: Prior to Admission medications   Medication Sig Start Date End Date Taking? Authorizing Provider  ALPRAZolam Prudy Feeler) 1 MG tablet Take 1 tablet (1 mg total) by mouth 2 (two) times daily as needed for anxiety. 08/26/15  Yes [name], MD  butalbital-acetaminophen-caffeine (FIORICET) [phone] MG tablet Take 1-2 tablets by mouth every 6 (six) hours as needed for headache. 04/14/15 04/13/16 Yes [name], MD  Calcium-Magnesium-Vitamin D (CALCIUM 1200+D3 PO) Take 1 tablet by mouth daily. Contains 800iu Vitamin D3   Yes Historical Provider, MD  cetirizine (ZYRTEC) 10 MG tablet Take 1 tablet (10 mg total) by mouth at bedtime. Patient taking  differently: Take 10 mg by mouth at bedtime as needed for allergies.  03/05/15  Yes [name], MD  cholecalciferol (VITAMIN D) 1000 UNITS tablet Take 1 tablet (1,000 Units total) by mouth daily at 12 noon. 12/05/14  Yes Adonis Brook, NP  CRANBERRY FRUIT PO Take 1 capsule by mouth daily.   Yes Historical Provider, MD  DULoxetine (CYMBALTA) 60 MG capsule Take 1 capsule (60 mg total) by mouth daily. 06/06/15  Yes [name], MD  esomeprazole (NEXIUM) 20 MG capsule Take 40 mg by mouth daily at 12 noon.   Yes Historical Provider, MD  furosemide (LASIX) 40 MG tablet Take 1 tablet (40 mg total) by mouth daily. 05/08/15  Yes [name], MD  gabapentin (NEURONTIN) 300 MG capsule Take 2 capsules (600 mg total) by mouth 2 (two) times daily. 05/08/15  Yes [name], MD  hydrOXYzine  (ATARAX/VISTARIL) 50 MG tablet Take 1 tablet (50 mg total) by mouth every 6 (six) hours as needed. Patient taking differently: Take 50 mg by mouth every 6 (six) hours as needed for anxiety.  06/06/15  Yes [name], MD  levothyroxine (SYNTHROID, LEVOTHROID) 50 MCG tablet TAKE 1 TABLET BY MOUTH EVERY DAY... MAX OF 30 DAYS ON INSURANCE 05/08/15  Yes [name], MD  lidocaine (LIDODERM) 5 % PLACE 1 PATCH ONTO THE SKIN DAILY. REMOVE & DISCARD PATCH WITHIN 12 HOURS OR AS DIRECTED BY MD 04/12/15  Yes Historical Provider, MD  Misc Natural Products (ESTROVEN + ENERGY MAX STRENGTH PO) Take 1 tablet by mouth daily.   Yes Historical Provider, MD  potassium chloride SA (KLOR-CON M20) 20 MEQ tablet TAKE 1 TABLET (20 MEQ TOTAL) BY MOUTH 2 (TWO) TIMES DAILY. 05/08/15  Yes [name], MD  rizatriptan (MAXALT) 10 MG tablet Take 0.5 tablets (5 mg total) by mouth as needed for migraine. May repeat in 2 hours if needed 01/17/15  Yes [name], MD  topiramate (TOPAMAX) 100 MG tablet TAKE 1/2 TABLET BY MOUTH IN THE MORNING AND 1 TAB AT BEDTIME Patient taking differently: Take 50-100 mg by mouth 2 (two) times daily. TAKE 1/2 TABLET BY MOUTH IN THE MORNING AND 1 TAB AT BEDTIME 06/06/15  Yes [name], MD  vitamin E 1000 UNIT capsule Take 1,000 Units by mouth daily.   Yes Historical Provider, MD    Allergies:   Allergies  Allergen Reactions  . Tramadol Nausea And Vomiting    Social History:  reports that she has never smoked. She has never used smokeless tobacco. She reports that she does not drink alcohol or use illicit drugs.  Family History: Family History  Problem Relation Age of Onset  . Diabetes Mother   . Heart disease Mother   . Kidney disease Mother   . Thyroid disease Mother   . Heart disease Father   . Seizures Father   . COPD Father   . Cancer Maternal Grandmother     Physical Exam: Filed Vitals:   08/30/15 1900 08/30/15 1915 08/30/15 2000 08/30/15 2030  BP: 115/73  106/70 114/72  Pulse:  90  92 95  Temp:      TempSrc:      Resp:  Height:      Weight:      SpO2:  99% 94% 98%   General appearance: alert, cooperative and no distress Head: Normocephalic, without obvious abnormality, atraumatic Eyes: negative Nose: Nares normal. Septum midline. Mucosa normal. No drainage or sinus  tenderness. Neck: no JVD and supple, symmetrical, trachea midline Lungs: clear to auscultation bilaterally Heart: regular rate and rhythm, S1, S2 normal, no murmur, click, rub or gallop Abdomen: soft, non-tender; bowel sounds normal; no masses,  no organomegaly Extremities: extremities normal, atraumatic, no cyanosis or edema Pulses: 2+ and symmetric Skin: Skin color, texture, turgor normal. No rashes or lesions Neurologic: Grossly normal  Except 4/5 strength to rle    Labs on Admission:   Recent Labs  08/30/15 1828  NA 139  K 3.7  CL 104  CO2 26  GLUCOSE 102*  BUN 14  CREATININE 1.44*  CALCIUM 9.0    Recent Labs  08/30/15 1828  WBC 8.5  NEUTROABS 4.5  HGB 13.1  HCT 40.7  MCV 78.4  PLT 234    Recent Labs  08/30/15 1828  TROPONINI <0.03    Radiological Exams on Admission: Dg Chest 2 View  08/30/2015  CLINICAL DATA:  Syncope EXAM: CHEST  2 VIEW COMPARISON:  12/01/2014 chest radiograph. FINDINGS: Stable cardiomediastinal silhouette with normal heart size. No pneumothorax. No pleural effusion. Lungs appear clear, with no acute consolidative airspace disease and no pulmonary edema. IMPRESSION: No active cardiopulmonary disease. Electronically Signed   By: Delbert PhenixJason A Poff M.D.   On: 08/30/2015 19:05   Old chart reviewed Case discussed with dr loa in ED ekg reviewed nsr cxr reviewed no edema or infiltrate  Assessment/Plan  60 yo female with chronic uncontrolled lower back pain with syncopal episode likely due to vagal response to severe pain  Principal Problem:   Syncope and collapse- suspect this is due to her uncontrolled pain.  Neuro exam nml except for rle  weakness which is old.  ekg nsr.  cxr neg.  Trop nml.  Will order cardiac echo in am.  Consider holter monitor as outpatient but pt has long standing h/o passing out due to her pain.  See comments about her pain below.  Active Problems:   Hypertension- noted   Chronic kidney disease- stable, at baseline cr level   Anxiety and depression- cont xanax at night   Diastolic heart failure (HCC)- stable and compensated at this time, check orthostatics, hold lasix for now   Chronic back pain- will increase her neurontin to 600mg  tid from bid.  She is on lidoderm patches.  i have suggested to her possible fentanyl patch which would be less abusive potential and have suggested she discuss this with her pcp dr Clelia Croftshaw who has been working with her closely concerning her pain management.  i do not know the details of what occurrred over a year ago but it sounds like she was on so much pain medications that it got out of control and pt is very hesitant to go down this road again which is understandable.  She is allergic to tramadol.  Maximize neurontin dosing.  She has also never been on lyrica.    obs on tele.  Full code.  Sherral Dirocco A 08/30/2015, 9:23 PM

## 2015-08-30 NOTE — ED Notes (Signed)
Snack provided

## 2015-08-30 NOTE — ED Notes (Signed)
Pt informed still waiting on a bed. No needs voiced at this time.

## 2015-08-30 NOTE — ED Notes (Signed)
Pt reports had syncopal episode while walking up her driveway and fell in the gravel.  Reports has chronic back pain and has passed out from the pain in the past.  Reports had another syncopal episode this morning.  EMS reports when they arrived, neighbors and fire dept had initiated cpr.  However when they arrived and FD staff was performing chest compressions, pt was grunting and breathing.   Reports color was wnl.  Pt became responsive to an ammonia inhalant.  VSS wnl per ems.  98% on room air, nsr, cbg 92 and bp 113/78.  PT presently alert and oriented.  PT says she doesn't remember bystanders doing cpr.

## 2015-08-30 NOTE — Progress Notes (Signed)
Report received 

## 2015-08-30 NOTE — ED Provider Notes (Signed)
CSN: [id number]     Arrival date & time 08/30/15  1748 History   First MD Initiated Contact with Patient 08/30/15 1802     Chief Complaint  Patient presents with  . Loss of Consciousness     (Consider location/radiation/quality/duration/timing/severity/associated sxs/prior Treatment) HPI 60 year old female who presents with syncope. History of HTN, HLD, CKD, chronic low back pain and diastolic heart failure. She reports 2 syncopal episodes today. States that she was sitting in her chair and upon standing up to open her front door she felt lightheaded and felt her vision closing in. States that she had collapsed on the ground, woke up and sat herself back in her chair and felt better. Later on this afternoon, states that she was outside for 10 minutes watching her husband Agricultural consultant. Upon walking back into her home, she again felt lightheaded and felt her vision closing in and collapsed. A bystander saw her collapse, and started bystander CPR. The fire department subsequently called and arrived and continued CPR, although there was no pulse check performed. When EMS arrived, they noted that patient was coughing and groaning and advised discontinuation of CPR. They placed ammonia salts under her nose and she woke up appropriately. The patient does not recall CPR being performed. Her husband states that she has not been sleeping well over the past 3 days and has been more prone to dehydration recently due to chronic diarrhea. She denies any preceding palpitations, shortness of breath, chest pain prior to her episodes today. Denies any recent illnesses. No recent DOE, LE edema, LE pain, orthopnea or PND.   Past Medical History  Diagnosis Date  . Hypertension   . Cholesterol serum increased   . Hypothyroidism   . Sleep apnea   . Migraine   . Depression   . Neuromuscular disorder (HCC)   . GERD (gastroesophageal reflux disease)   . Anxiety   . Chronic back pain   . Chronic kidney disease    . Tachycardia   . Hyperparathyroidism (HCC)   . Diastolic heart failure [facility])    Past Surgical History  Procedure Laterality Date  . Abdominal hysterectomy    . Back surgery    . Appendectomy     Family History  Problem Relation Age of Onset  . Diabetes Mother   . Heart disease Mother   . Kidney disease Mother   . Thyroid disease Mother   . Heart disease Father   . Seizures Father   . COPD Father   . Cancer Maternal Grandmother    Social History  Substance Use Topics  . Smoking status: Never Smoker   . Smokeless tobacco: Never Used  . Alcohol Use: No   OB History    No data available     Review of Systems 10/14 systems reviewed and are negative other than those stated in the HPI    Allergies  Tramadol  Home Medications   Prior to Admission medications   Medication Sig Start Date End Date Taking? Authorizing Provider  ALPRAZolam Prudy Feeler) 1 MG tablet Take 1 tablet (1 mg total) by mouth 2 (two) times daily as needed for anxiety. 08/26/15  Yes [name], MD  butalbital-acetaminophen-caffeine (FIORICET) [phone] MG tablet Take 1-2 tablets by mouth every 6 (six) hours as needed for headache. 04/14/15 04/13/16 Yes [name], MD  Calcium-Magnesium-Vitamin D (CALCIUM 1200+D3 PO) Take 1 tablet by mouth daily. Contains 800iu Vitamin D3   Yes Historical Provider, MD  cetirizine (ZYRTEC) 10 MG  tablet Take 1 tablet (10 mg total) by mouth at bedtime. Patient taking differently: Take 10 mg by mouth at bedtime as needed for allergies.  03/05/15  Yes [name], MD  cholecalciferol (VITAMIN D) 1000 UNITS tablet Take 1 tablet (1,000 Units total) by mouth daily at 12 noon. 12/05/14  Yes Adonis Brook, NP  CRANBERRY FRUIT PO Take 1 capsule by mouth daily.   Yes Historical Provider, MD  DULoxetine (CYMBALTA) 60 MG capsule Take 1 capsule (60 mg total) by mouth daily. 06/06/15  Yes [name], MD  esomeprazole (NEXIUM) 20 MG capsule Take 40 mg by mouth daily at 12 noon.   Yes Historical  Provider, MD  furosemide (LASIX) 40 MG tablet Take 1 tablet (40 mg total) by mouth daily. 05/08/15  Yes [name], MD  gabapentin (NEURONTIN) 300 MG capsule Take 2 capsules (600 mg total) by mouth 2 (two) times daily. 05/08/15  Yes [name], MD  hydrOXYzine (ATARAX/VISTARIL) 50 MG tablet Take 1 tablet (50 mg total) by mouth every 6 (six) hours as needed. Patient taking differently: Take 50 mg by mouth every 6 (six) hours as needed for anxiety.  06/06/15  Yes [name], MD  levothyroxine (SYNTHROID, LEVOTHROID) 50 MCG tablet TAKE 1 TABLET BY MOUTH EVERY DAY... MAX OF 30 DAYS ON INSURANCE 05/08/15  Yes [name], MD  lidocaine (LIDODERM) 5 % PLACE 1 PATCH ONTO THE SKIN DAILY. REMOVE & DISCARD PATCH WITHIN 12 HOURS OR AS DIRECTED BY MD 04/12/15  Yes Historical Provider, MD  Misc Natural Products (ESTROVEN + ENERGY MAX STRENGTH PO) Take 1 tablet by mouth daily.   Yes Historical Provider, MD  potassium chloride SA (KLOR-CON M20) 20 MEQ tablet TAKE 1 TABLET (20 MEQ TOTAL) BY MOUTH 2 (TWO) TIMES DAILY. 05/08/15  Yes [name], MD  rizatriptan (MAXALT) 10 MG tablet Take 0.5 tablets (5 mg total) by mouth as needed for migraine. May repeat in 2 hours if needed 01/17/15  Yes [name], MD  topiramate (TOPAMAX) 100 MG tablet TAKE 1/2 TABLET BY MOUTH IN THE MORNING AND 1 TAB AT BEDTIME Patient taking differently: Take 50-100 mg by mouth 2 (two) times daily. TAKE 1/2 TABLET BY MOUTH IN THE MORNING AND 1 TAB AT BEDTIME 06/06/15  Yes [name], MD  vitamin E 1000 UNIT capsule Take 1,000 Units by mouth daily.   Yes Historical Provider, MD   BP 112/73 mmHg  Pulse 95  Temp(Src) 98 F (36.7 C) (Oral)  Resp 14  Ht  (1.6 m)  Wt 170 lb (77.111 kg)  BMI 30.12 kg/m2  SpO2 99% Physical Exam Physical Exam  Nursing note and vitals reviewed. Constitutional: Well developed, well nourished, non-toxic, and in no acute distress Head: Normocephalic and atraumatic.  Mouth/Throat: Oropharynx is clear and moist.   Neck: Normal range of motion. Neck supple.  Cardiovascular: Normal rate and regular rhythm.  +2 DP and radial pulses bilaterally Pulmonary/Chest: Effort normal and breath sounds normal.  Abdominal: Soft. There is no tenderness. There is no rebound and no guarding.  Musculoskeletal: Normal range of motion.  Neurological: Alert, no facial droop, fluent speech, moves all extremities symmetrically Skin: Skin is warm and dry.  Psychiatric: Cooperative  ED Course  Procedures (including critical care time) Labs Review Labs Reviewed  CBC WITH DIFFERENTIAL/PLATELET - Abnormal; Notable for the following:    RBC 5.19 (*)    MCH 25.2 (*)    All other components within normal  limits  BASIC METABOLIC PANEL  TROPONIN I  CBG MONITORING, ED    Imaging Review Dg Chest 2 View  08/30/2015  CLINICAL DATA:  Syncope EXAM: CHEST  2 VIEW COMPARISON:  12/01/2014 chest radiograph. FINDINGS: Stable cardiomediastinal silhouette with normal heart size. No pneumothorax. No pleural effusion. Lungs appear clear, with no acute consolidative airspace disease and no pulmonary edema. IMPRESSION: No active cardiopulmonary disease. Electronically Signed   By: Delbert PhenixJason A Poff M.D.   On: 08/30/2015 19:05   I have personally reviewed and evaluated these images and lab results as part of my medical decision-making.   EKG Interpretation   Date/Time:  Saturday August 30 2015 17:56:10 EDT Ventricular Rate:  91 PR Interval:  141 QRS Duration: 84 QT Interval:  375 QTC Calculation: 461 R Axis:   46 Text Interpretation:  Sinus rhythm No significant change since last  tracing Confirmed by [facility] MD, Barbara CowerJASON [phone] on 08/30/2015 6:00:16 PM      MDM   Final diagnoses:  None    In short, this is a 60 year old female with diastolic heart failure who presents with two syncopal episodes. On presentation, she is well appearing and in now acute distress. Complains of her baseline low back pain and minimal chest discomfort from  CPR. Cardiopulmonary exam unremarkable. EKG without stigmata of arrhythmia, heart strain, or ischemia. Unremarkable CBC, basic metabolic panel. Chest x-ray without acute cardiopulmonary processes. Troponin is negative. Etiology of patient's symptoms seems more related to orthostasis, however patient with history of diastolic heart failure and 2 episodes today. Some risk for cardiogenic etiology. Will plan to admit for observation overnight and ? Repeat ECHO.  Lavera Guiseana Duo Marelin Tat, MD 08/30/15 [phone]

## 2015-08-31 ENCOUNTER — Observation Stay (HOSPITAL_BASED_OUTPATIENT_CLINIC_OR_DEPARTMENT_OTHER): Payer: Commercial Managed Care - HMO

## 2015-08-31 DIAGNOSIS — F418 Other specified anxiety disorders: Secondary | ICD-10-CM | POA: Diagnosis not present

## 2015-08-31 DIAGNOSIS — I5032 Chronic diastolic (congestive) heart failure: Secondary | ICD-10-CM

## 2015-08-31 DIAGNOSIS — I10 Essential (primary) hypertension: Secondary | ICD-10-CM | POA: Diagnosis not present

## 2015-08-31 DIAGNOSIS — R55 Syncope and collapse: Secondary | ICD-10-CM | POA: Diagnosis not present

## 2015-08-31 LAB — BASIC METABOLIC PANEL
ANION GAP: 9 (ref 5–15)
BUN: 14 mg/dL (ref 6–20)
CHLORIDE: 105 mmol/L (ref 101–111)
CO2: 26 mmol/L (ref 22–32)
Calcium: 8.8 mg/dL — ABNORMAL LOW (ref 8.9–10.3)
Creatinine, Ser: 1.45 mg/dL — ABNORMAL HIGH (ref 0.44–1.00)
GFR calc Af Amer: 44 mL/min — ABNORMAL LOW (ref >60)
GFR, EST NON AFRICAN AMERICAN: 38 mL/min — AB (ref >60)
GLUCOSE: 113 mg/dL — AB (ref 65–99)
POTASSIUM: 3.7 mmol/L (ref 3.5–5.1)
Sodium: 140 mmol/L (ref 135–145)

## 2015-08-31 LAB — CBC
HEMATOCRIT: 36.7 % (ref 36.0–46.0)
HEMOGLOBIN: 11.7 g/dL — AB (ref 12.0–15.0)
MCH: 25.2 pg — ABNORMAL LOW (ref 26.0–34.0)
MCHC: 31.9 g/dL (ref 30.0–36.0)
MCV: 78.9 fL (ref 78.0–100.0)
Platelets: 240 10*3/uL (ref 150–400)
RBC: 4.65 MIL/uL (ref 3.87–5.11)
RDW: 15.2 % (ref 11.5–15.5)
WBC: 9.1 10*3/uL (ref 4.0–10.5)

## 2015-08-31 LAB — GLUCOSE, CAPILLARY: Glucose-Capillary: 107 mg/dL — ABNORMAL HIGH (ref 65–99)

## 2015-08-31 LAB — MAGNESIUM: MAGNESIUM: 2.1 mg/dL (ref 1.7–2.4)

## 2015-08-31 LAB — ECHOCARDIOGRAM COMPLETE
Height: 63 in
WEIGHTICAEL: 3244.8 [oz_av]

## 2015-08-31 LAB — TROPONIN I: Troponin I: <0.03 ng/mL (ref <0.031)

## 2015-08-31 MED ORDER — LEVOTHYROXINE SODIUM 50 MCG PO TABS
50.0000 ug | ORAL_TABLET | Freq: Every day | ORAL | Status: DC
  Administered 2015-08-31 – 2015-09-01 (×2): 50 ug via ORAL
  Filled 2015-08-31 (×3): qty 1

## 2015-08-31 MED ORDER — TOPIRAMATE 25 MG PO TABS: 50.0000 mg | ORAL_TABLET | Freq: Two times a day (BID) | ORAL | Status: DC

## 2015-08-31 MED ORDER — TRAZODONE HCL 50 MG PO TABS: 100.0000 mg | ORAL_TABLET | Freq: Every day | ORAL | Status: DC

## 2015-08-31 MED ORDER — VITAMIN D 1000 UNITS PO TABS
1000.0000 [IU] | ORAL_TABLET | Freq: Every day | ORAL | Status: DC
  Administered 2015-08-31: 1000 [IU] via ORAL
  Filled 2015-08-31: qty 1

## 2015-08-31 MED ORDER — TRAZODONE HCL 50 MG PO TABS: 50.0000 mg | ORAL_TABLET | Freq: Every day | ORAL | Status: DC

## 2015-08-31 MED ORDER — PANTOPRAZOLE SODIUM 40 MG PO TBEC
40.0000 mg | DELAYED_RELEASE_TABLET | Freq: Every day | ORAL | Status: DC
  Administered 2015-08-31 – 2015-09-01 (×2): 40 mg via ORAL
  Filled 2015-08-31 (×2): qty 1

## 2015-08-31 MED ORDER — LIDOCAINE 5 % EX PTCH
1.0000 | MEDICATED_PATCH | CUTANEOUS | Status: DC
  Administered 2015-08-31: 1 via TRANSDERMAL
  Filled 2015-08-31 (×4): qty 1

## 2015-08-31 MED ORDER — ACETAMINOPHEN 325 MG PO TABS
650.0000 mg | ORAL_TABLET | Freq: Four times a day (QID) | ORAL | Status: DC | PRN
  Administered 2015-08-31 (×2): 650 mg via ORAL
  Filled 2015-08-31 (×2): qty 2

## 2015-08-31 NOTE — Progress Notes (Signed)
Per pt and family, pt has had reaction to trazodone.  Pt had hallucinations and behavior changes.  Place trazodone on pt allergy list and got verbal order to D/C trazodone.

## 2015-08-31 NOTE — Progress Notes (Signed)
*  PRELIMINARY RESULTS* Echocardiogram 2D Echocardiogram has been performed.  Jennifer Davidson 08/31/2015, 9:25 AM

## 2015-08-31 NOTE — Progress Notes (Signed)
[facility] PROGRESS NOTE  Jennifer Davidson ZOX:[id number] DOB: Jan 04, 1956    PCP:   Norberto Sorenson, MD   HPI: Jennifer Davidson is an 60 y.o. female admitted with recurrent syncope in the setting of having severe back pain. She was started on CPR by bystander at the recommendation of 911 operator, but EMS stopped the CPR upon arrival as she was moaning.  She had unremarkable EKG, negative troponin, and negative serology.  ECHO is pending and she is feeling well today. She denies drinking or smoking. She takes Xanax  BID and does not feel as though this is related to her syncope.  Rewiew of Systems:  Constitutional: Negative for malaise, fever and chills. No significant weight loss or weight gain Eyes: Negative for eye pain, redness and discharge, diplopia, visual changes, or flashes of light. ENMT: Negative for ear pain, hoarseness, nasal congestion, sinus pressure and sore throat. No headaches; tinnitus, drooling, or problem swallowing. Cardiovascular: Negative for chest pain, palpitations, diaphoresis, dyspnea and peripheral edema. ; No orthopnea, PND Respiratory: Negative for cough, hemoptysis, wheezing and stridor. No pleuritic chestpain. Gastrointestinal: Negative for nausea, vomiting, diarrhea, constipation, abdominal pain, melena, blood in stool, hematemesis, jaundice and rectal bleeding.    Genitourinary: Negative for frequency, dysuria, incontinence,flank pain and hematuria; Musculoskeletal: Negative for back pain and neck pain. Negative for swelling and trauma.;  Skin: . Negative for pruritus, rash, abrasions, bruising and skin lesion.; ulcerations Neuro: Negative for headache, lightheadedness and neck stiffness. Negative for weakness, altered level of consciousness , altered mental status, extremity weakness, burning feet, involuntary movement, seizure and syncope.  Psych: negative for anxiety, depression, insomnia, tearfulness, panic attacks, hallucinations, paranoia, suicidal or homicidal  ideation   Past Medical History  Diagnosis Date  . Hypertension   . Cholesterol serum increased   . Hypothyroidism   . Sleep apnea   . Migraine   . Depression   . Neuromuscular disorder (HCC)   . GERD (gastroesophageal reflux disease)   . Anxiety   . Chronic back pain   . Chronic kidney disease   . Tachycardia   . Hyperparathyroidism (HCC)   . Diastolic heart failure [facility])     Past Surgical History  Procedure Laterality Date  . Abdominal hysterectomy    . Back surgery    . Appendectomy      Medications:  HOME MEDS: Prior to Admission medications   Medication Sig Start Date End Date Taking? Authorizing Provider  ALPRAZolam Prudy Feeler) 1 MG tablet Take 1 tablet (1 mg total) by mouth 2 (two) times daily as needed for anxiety. 08/26/15  Yes [name], MD  butalbital-acetaminophen-caffeine (FIORICET) [id number] MG tablet Take 1-2 tablets by mouth every 6 (six) hours as needed for headache. 04/14/15 04/13/16 Yes [name], MD  Calcium-Magnesium-Vitamin D (CALCIUM 1200+D3 PO) Take 1 tablet by mouth daily. Contains 800iu Vitamin D3   Yes Historical Provider, MD  cetirizine (ZYRTEC) 10 MG tablet Take 1 tablet (10 mg total) by mouth at bedtime. Patient taking differently: Take 10 mg by mouth at bedtime as needed for allergies.  03/05/15  Yes [name], MD  cholecalciferol (VITAMIN D) 1000 UNITS tablet Take 1 tablet (1,000 Units total) by mouth daily at 12 noon. 12/05/14  Yes Adonis Brook, NP  CRANBERRY FRUIT PO Take 1 capsule by mouth daily.   Yes Historical Provider, MD  DULoxetine (CYMBALTA) 60 MG capsule Take 1 capsule (60 mg total) by mouth daily. 06/06/15  Yes [name], MD  esomeprazole (NEXIUM) 20  MG capsule Take 40 mg by mouth daily at 12 noon.   Yes Historical Provider, MD  furosemide (LASIX) 40 MG tablet Take 1 tablet (40 mg total) by mouth daily. 05/08/15  Yes [name], MD  gabapentin (NEURONTIN) 300 MG capsule Take 2 capsules (600 mg total) by mouth 2 (two) times daily.  05/08/15  Yes [name], MD  hydrOXYzine (ATARAX/VISTARIL) 50 MG tablet Take 1 tablet (50 mg total) by mouth every 6 (six) hours as needed. Patient taking differently: Take 50 mg by mouth every 6 (six) hours as needed for anxiety.  06/06/15  Yes [name], MD  levothyroxine (SYNTHROID, LEVOTHROID) 50 MCG tablet TAKE 1 TABLET BY MOUTH EVERY DAY... MAX OF 30 DAYS ON INSURANCE 05/08/15  Yes [name], MD  lidocaine (LIDODERM) 5 % PLACE 1 PATCH ONTO THE SKIN DAILY. REMOVE & DISCARD PATCH WITHIN 12 HOURS OR AS DIRECTED BY MD 04/12/15  Yes Historical Provider, MD  Misc Natural Products (ESTROVEN + ENERGY MAX STRENGTH PO) Take 1 tablet by mouth daily.   Yes Historical Provider, MD  potassium chloride SA (KLOR-CON M20) 20 MEQ tablet TAKE 1 TABLET (20 MEQ TOTAL) BY MOUTH 2 (TWO) TIMES DAILY. 05/08/15  Yes [name], MD  rizatriptan (MAXALT) 10 MG tablet Take 0.5 tablets (5 mg total) by mouth as needed for migraine. May repeat in 2 hours if needed 01/17/15  Yes [name], MD  topiramate (TOPAMAX) 100 MG tablet TAKE 1/2 TABLET BY MOUTH IN THE MORNING AND 1 TAB AT BEDTIME Patient taking differently: Take 50-100 mg by mouth 2 (two) times daily. TAKE 1/2 TABLET BY MOUTH IN THE MORNING AND 1 TAB AT BEDTIME 06/06/15  Yes [name], MD  vitamin E 1000 UNIT capsule Take 1,000 Units by mouth daily.   Yes Historical Provider, MD     Allergies:  Allergies  Allergen Reactions  . Tramadol Nausea And Vomiting    Social History:   reports that she has never smoked. She has never used smokeless tobacco. She reports that she does not drink alcohol or use illicit drugs.  Family History: Family History  Problem Relation Age of Onset  . Diabetes Mother   . Heart disease Mother   . Kidney disease Mother   . Thyroid disease Mother   . Heart disease Father   . Seizures Father   . COPD Father   . Cancer Maternal Grandmother      Physical Exam: Filed Vitals:   08/30/15 2030 08/30/15 2222 08/30/15 2241 08/31/15  0500  BP: 114/72 122/81  101/63  Pulse: 95 87  78  Temp:  98 F (36.7 C) 98.5 F (36.9 C) 97.9 F (36.6 C)  TempSrc:  Oral Oral Oral  Resp: 20   20  Height:      Weight:   91.717 kg (202 lb 3.2 oz) 91.989 kg (202 lb 12.8 oz)  SpO2: 98% 99% 97% 100%   Blood pressure 101/63, pulse 78, temperature 97.9 F (36.6 C), temperature source Oral, resp. rate 20, height  (1.6 m), weight 91.989 kg (202 lb 12.8 oz), SpO2 100 %.  GEN:  Pleasant, patient lying in the stretcher in no acute distress; cooperative with exam. PSYCH:  alert and oriented x4; does not appear anxious or depressed; affect is appropriate. HEENT: Mucous membranes pink and anicteric; PERRLA; EOM intact; no cervical lymphadenopathy nor thyromegaly or carotid bruit; no JVD; There were no stridor. Neck is very supple. Breasts:: Not examined  CHEST WALL: No tenderness CHEST: Normal respiration, clear to auscultation bilaterally.  HEART: Regular rate and rhythm.  There are no murmur, rub, or gallops.   BACK: No kyphosis or scoliosis; no CVA tenderness ABDOMEN: soft and non-tender; no masses, no organomegaly, normal abdominal bowel sounds; no pannus; no intertriginous candida. There is no rebound and no distention. Rectal Exam: Not done EXTREMITIES: No bone or joint deformity; age-appropriate arthropathy of the hands and knees; no edema; no ulcerations.  There is no calf tenderness. Genitalia: not examined PULSES: 2+ and symmetric SKIN: Normal hydration no rash or ulceration CNS: Cranial nerves 2-12 grossly intact no focal lateralizing neurologic deficit.  Speech is fluent; uvula elevated with phonation, facial symmetry and tongue midline. DTR are normal bilaterally, cerebella exam is intact, barbinski is negative and strengths are equaled bilaterally.  No sensory loss.   Labs on Admission:  Basic Metabolic Panel:  Recent Labs Lab 08/30/15 1828 08/31/15 0432  NA 139 140  K 3.7 3.7  CL 104 105  CO2 26 26  GLUCOSE 102*  113*  BUN 14 14  CREATININE 1.44* 1.45*  CALCIUM 9.0 8.8*   CBC:  Recent Labs Lab 08/30/15 1828 08/31/15 0432  WBC 8.5 9.1  NEUTROABS 4.5  --   HGB 13.1 11.7*  HCT 40.7 36.7  MCV 78.4 78.9  PLT 234 240   Cardiac Enzymes:  Recent Labs Lab 08/30/15 1828 08/30/15 2228 08/31/15 0432  TROPONINI <0.03 <0.03 <0.03    CBG:  Recent Labs Lab 08/30/15 1753 08/31/15 0729  GLUCAP 82 107*     Radiological Exams on Admission: Dg Chest 2 View  08/30/2015  CLINICAL DATA:  Syncope EXAM: CHEST  2 VIEW COMPARISON:  12/01/2014 chest radiograph. FINDINGS: Stable cardiomediastinal silhouette with normal heart size. No pneumothorax. No pleural effusion. Lungs appear clear, with no acute consolidative airspace disease and no pulmonary edema. IMPRESSION: No active cardiopulmonary disease. Electronically Signed   By: Delbert PhenixJason A Poff M.D.   On: 08/30/2015 19:05    EKG: Independently reviewed.    Assessment/Plan Present on Admission:  . Chronic kidney disease . Anxiety and depression . Diastolic heart failure (HCC) . Syncope and collapse . Neuromuscular disorder (HCC) . Hypertension  PLAN:  Syncope 2x. Troponin negative x3. CXR non-acute. EKG normal.   Await ECHO result.  Continue IVF.   Will continue to monitor.  Anticipate discharge tomorrow.   Other plans as per orders. Code Status: Full   Houston SirenPeter Cruze Zingaro,  MD.  FACP [facility] Pager [phone] 7pm to 7am.  08/31/2015, 8:44 AM

## 2015-09-01 DIAGNOSIS — R55 Syncope and collapse: Secondary | ICD-10-CM | POA: Diagnosis not present

## 2015-09-01 DIAGNOSIS — F418 Other specified anxiety disorders: Secondary | ICD-10-CM | POA: Diagnosis not present

## 2015-09-01 DIAGNOSIS — I10 Essential (primary) hypertension: Secondary | ICD-10-CM | POA: Diagnosis not present

## 2015-09-01 DIAGNOSIS — I5032 Chronic diastolic (congestive) heart failure: Secondary | ICD-10-CM | POA: Diagnosis not present

## 2015-09-01 LAB — GLUCOSE, CAPILLARY: Glucose-Capillary: 95 mg/dL (ref 65–99)

## 2015-09-01 NOTE — Discharge Summary (Signed)
Physician Discharge Summary  Jennifer Davidson ZOX:[id number] DOB: 1955/11/15 DOA: 08/30/2015  PCP: Norberto Sorenson, MD  Admit date: 08/30/2015 Discharge date: 09/01/2015  Time spent: 35 minutes  Recommendations for Outpatient Follow-up:  1. Follow up with your PCP in one week.   Discharge Diagnoses:  Principal Problem:   Syncope and collapse Active Problems:   Hypertension   Chronic kidney disease   Anxiety and depression   Diastolic heart failure (HCC)   Neuromuscular disorder (HCC)   Discharge Condition: Stable.  No lightheadedness.   Diet recommendation: cardiac diet.   Filed Weights   08/30/15 2241 08/31/15 0500 09/01/15 0306  Weight: 91.717 kg (202 lb 3.2 oz) 91.989 kg (202 lb 12.8 oz) 91.581 kg (201 lb 14.4 oz)    History of present illness: Patient was admitted for recurrent syncope by Dr Onalee Hua on August 30, 2015.  As per her H and P:  "  60 yo female with h/o htn, diastolic chf, chronic back pain, ckd baseline cr 1.4 comes in after 2 episodes of syncope today. Pt reports today was a bad day for her back pain. She has really bad days where her back hurts severely and she has passed out before in the setting of when her pain is high. She ws walking in her yard today to the end of her driveway and began to walk back, her back pain was severe and she got dizzy and weak and passed out. Over a year ago she was on multiple pain meds for her back and it got really out of hand which caused her to be hospitalized for psychiatric help. She was taken off all pain meds at that time, all controlled substances. So she has been trying to manage her pain since with dr [name] with medications that are less addicting. She has been on lidocaine patches and neurontin  bid. She says this really does not help much but she has learned to deal with it. She is s/p L4/5 fusion and is undergoing evaluation by her orthopedic surgeon for further procedures at other levels of her lower spine. She chronically  has right leg weakness which is mild from her previous surgery in 2011. She denies any numbness anywhere, no urinary or bowel incontinence. No fevers. No worsening weakness to her legs that is new. She denies any chest pain, no sob. No leg swelling. No headache. No n/v/d. No recent illnesses. Of note, pt passed out in her yard today, a neighbor went and started cpr on her for unknown amount of time. Ems arrived and stopped the cpr as she had a pulse and was moaning. Pt does not remember this. She was also recently started on xanax for sleep as she has not been sleeping because of her pain. Pt referred for admission for syncope.   Hospital Course:  Jennifer Davidson is an 60 y.o. female admitted with recurrent syncopes in the setting of having severe back pain. She was started CPR by bystanders at the recommendation of 911 operator, but EMS stopped the CPR upon arrival as she was moaning. She had unremarkable EKG, negative troponins, and negative serology. ECHO was normal,  and she is feeling well today. She denies drinking or smoking. She takes Xanax  BID and does not feel as though this is related to her syncope as she had been on this dose for quite a while.   She received her home meds, and received IVF.  She felt well, anxious to go home, and felt to be  stable for discharge.  She will refrain from driving for a week just to be sure she no longer has similar symptoms.   She will folllow up with her PCP next week.  I cautioned her against sedative medications in the future.  Thank you for allowing me to participate in her care.  Good Day.    Procedures:  ECHO:   Normal.  Consultations:       None.   Discharge Exam: Filed Vitals:   09/01/15 0600 09/01/15 1001  BP: 109/72 123/75  Pulse: 75 90  Temp: 98.2 F (36.8 C) 97.8 F (36.6 C)  Resp: 20 18    Discharge Instructions   Discharge Instructions    Diet - low sodium heart healthy    Complete by:  As directed      Discharge  instructions    Complete by:  As directed   Avoid sedative medications.  Follow up with your PCP next week.     Increase activity slowly    Complete by:  As directed           Current Discharge Medication List    CONTINUE these medications which have NOT CHANGED   Details  ALPRAZolam (XANAX) 1 MG tablet Take 1 tablet (1 mg total) by mouth 2 (two) times daily as needed for anxiety. Qty: 60 tablet, Refills: 0    Calcium-Magnesium-Vitamin D (CALCIUM 1200+D3 PO) Take 1 tablet by mouth daily. Contains 800iu Vitamin D3    cetirizine (ZYRTEC) 10 MG tablet Take 1 tablet (10 mg total) by mouth at bedtime. Qty: 90 tablet, Refills: 3    cholecalciferol (VITAMIN D) 1000 UNITS tablet Take 1 tablet (1,000 Units total) by mouth daily at 12 noon. Qty: 30 tablet, Refills: 0    DULoxetine (CYMBALTA) 60 MG capsule Take 1 capsule (60 mg total) by mouth daily. Qty: 90 capsule, Refills: 3    esomeprazole (NEXIUM) 20 MG capsule Take 40 mg by mouth daily at 12 noon.    gabapentin (NEURONTIN) 300 MG capsule Take 2 capsules (600 mg total) by mouth 2 (two) times daily. Qty: 360 capsule, Refills: 3    levothyroxine (SYNTHROID, LEVOTHROID) 50 MCG tablet TAKE 1 TABLET BY MOUTH EVERY DAY... MAX OF 30 DAYS ON INSURANCE Qty: 90 tablet, Refills: 1    lidocaine (LIDODERM) 5 % PLACE 1 PATCH ONTO THE SKIN DAILY. REMOVE & DISCARD PATCH WITHIN 12 HOURS OR AS DIRECTED BY MD Refills: 11    rizatriptan (MAXALT) 10 MG tablet Take 0.5 tablets (5 mg total) by mouth as needed for migraine. May repeat in 2 hours if needed Qty: 10 tablet, Refills: 5    topiramate (TOPAMAX) 100 MG tablet TAKE 1/2 TABLET BY MOUTH IN THE MORNING AND 1 TAB AT BEDTIME Qty: 135 tablet, Refills: 1      STOP taking these medications     butalbital-acetaminophen-caffeine (FIORICET) 50-325-40 MG tablet      CRANBERRY FRUIT PO      furosemide (LASIX) 40 MG tablet      hydrOXYzine (ATARAX/VISTARIL) 50 MG tablet      Misc Natural Products  (ESTROVEN + ENERGY MAX STRENGTH PO)      potassium chloride SA (KLOR-CON M20) 20 MEQ tablet      vitamin E 1000 UNIT capsule        Allergies  Allergen Reactions  . Tramadol Nausea And Vomiting  . Trazodone And Nefazodone Other (See Comments)    Per pt trazodone caused hallucinations and behavior changes  The results of significant diagnostics from this hospitalization (including imaging, microbiology, ancillary and laboratory) are listed below for reference.    Significant Diagnostic Studies: Dg Chest 2 View  08/30/2015  CLINICAL DATA:  Syncope EXAM: CHEST  2 VIEW COMPARISON:  12/01/2014 chest radiograph. FINDINGS: Stable cardiomediastinal silhouette with normal heart size. No pneumothorax. No pleural effusion. Lungs appear clear, with no acute consolidative airspace disease and no pulmonary edema. IMPRESSION: No active cardiopulmonary disease. Electronically Signed   By: Delbert Phenix M.D.   On: 08/30/2015 19:05    Microbiology: No results found for this or any previous visit (from the past 240 hour(s)).   Labs: Basic Metabolic Panel:  Recent Labs Lab 08/30/15 1828 08/31/15 0432 08/31/15 1551  NA 139 140  --   K 3.7 3.7  --   CL 104 105  --   CO2 26 26  --   GLUCOSE 102* 113*  --   BUN 14 14  --   CREATININE 1.44* 1.45*  --   CALCIUM 9.0 8.8*  --   MG  --   --  2.1    Recent Labs Lab 08/30/15 1828 08/31/15 0432  WBC 8.5 9.1  NEUTROABS 4.5  --   HGB 13.1 11.7*  HCT 40.7 36.7  MCV 78.4 78.9  PLT 234 240   Cardiac Enzymes:  Recent Labs Lab 08/30/15 1828 08/30/15 2228 08/31/15 0432 08/31/15 1040  TROPONINI <0.03 <0.03 <0.03 <0.03    CBG:  Recent Labs Lab 08/30/15 1753 08/31/15 0729 09/01/15 0720  GLUCAP 82 107* 95    Signed:  Eliah Marquard MD. Jerrel Ivory.  [facility] 09/01/2015, 10:05 AM

## 2015-09-01 NOTE — Care Management Note (Signed)
Case Management Note  Patient Details  Name: Jennifer Davidson MRN: [id number] Date of Birth: 09/07/1955  Subjective/Objective:  Spoke with patient who is alert and oriented from home. Stated that they can afford medications and are able to make thier medical appointments without difficulty. No DME or O2 at home. No CM needs identified.                  Action/Plan:Home with self care.   Expected Discharge Date:                  Expected Discharge Plan:  Home/Self Care  In-House Referral:     Discharge planning Services  CM Consult  Post Acute Care Choice:    Choice offered to:     DME Arranged:    DME Agency:     HH Arranged:    HH Agency:     Status of Service:  Completed, signed off  Medicare Important Message Given:    Date Medicare IM Given:    Medicare IM give by:    Date Additional Medicare IM Given:    Additional Medicare Important Message give by:     If discussed at Long Length of Stay Meetings, dates discussed:    Additional Comments:  Adonis HugueninBerkhead, Laverle Pillard L, RN 09/01/2015, 10:17 AM

## 2015-09-01 NOTE — Progress Notes (Signed)
Patient's IV removed.  Site WNL.  AVS reviewed with patient.  Verbalized understanding of discharge instructions, physician follow-up, medications.  Patient has follow-up appointment on 09/03/15 with PCP.  Patient reports belongings intact and in possession at time of discharge.  Patient transported by NT via wheelchair to main entrance for discharge.  Patient stable at time of discharge.

## 2015-09-01 NOTE — Care Management Obs Status (Signed)
MEDICARE OBSERVATION STATUS NOTIFICATION   Patient Details  Name: Jennifer Davidson MRN: [id number] Date of Birth: 04/13/1956   Medicare Observation Status Notification Given:  Yes    Adonis HugueninBerkhead, Mitali Shenefield L, RN 09/01/2015, 10:17 AM

## 2015-09-03 ENCOUNTER — Ambulatory Visit (INDEPENDENT_AMBULATORY_CARE_PROVIDER_SITE_OTHER): Payer: Commercial Managed Care - HMO | Admitting: Family Medicine

## 2015-09-03 VITALS — BP 132/88 | HR 105 | Temp 97.7°F | Resp 18 | Ht 63.0 in | Wt 200.0 lb

## 2015-09-03 DIAGNOSIS — F418 Other specified anxiety disorders: Secondary | ICD-10-CM

## 2015-09-03 DIAGNOSIS — F333 Major depressive disorder, recurrent, severe with psychotic symptoms: Secondary | ICD-10-CM

## 2015-09-03 DIAGNOSIS — F419 Anxiety disorder, unspecified: Secondary | ICD-10-CM

## 2015-09-03 DIAGNOSIS — G709 Myoneural disorder, unspecified: Secondary | ICD-10-CM

## 2015-09-03 DIAGNOSIS — F329 Major depressive disorder, single episode, unspecified: Secondary | ICD-10-CM

## 2015-09-03 DIAGNOSIS — G5791 Unspecified mononeuropathy of right lower limb: Secondary | ICD-10-CM | POA: Diagnosis not present

## 2015-09-03 DIAGNOSIS — Z79899 Other long term (current) drug therapy: Secondary | ICD-10-CM

## 2015-09-03 DIAGNOSIS — F32A Depression, unspecified: Secondary | ICD-10-CM

## 2015-09-03 MED ORDER — ALPRAZOLAM ER 1 MG PO TB24: 1.0000 mg | ORAL_TABLET | Freq: Every day | ORAL | Status: DC

## 2015-09-03 MED ORDER — GABAPENTIN 300 MG PO CAPS: 600.0000 mg | ORAL_CAPSULE | Freq: Three times a day (TID) | ORAL | Status: DC

## 2015-09-03 MED ORDER — CYCLOBENZAPRINE HCL ER 30 MG PO CP24: 30.0000 mg | ORAL_CAPSULE | Freq: Every evening | ORAL | Status: DC | PRN

## 2015-09-03 NOTE — Progress Notes (Addendum)
Subjective:  By signing my name below, I, Stann Ore, attest that this documentation has been prepared under the direction and in the presence of Norberto Sorenson, MD. Electronically Signed: Stann Ore, Scribe. 09/03/2015 , 2:37 PM .  Patient was seen in Room 8 .   Patient ID: Jennifer Davidson, female    DOB: [birth year], 60 y.o.   MRN: [id number] Chief Complaint  Patient presents with  . Follow-up    post hospital visit   HPI Jennifer Davidson is a 60 y.o. female who presents to [facility] for hospital follow up.  4 days ago, she had experienced 2 syncopal episodes; 2nd one was witnessed by neighbor, who performed bystander CPR. The fire dept arrived and continued CPR, appears they forgot to check her pulse. EMS arrived and found her conscious, moving around and groaning, advised to stop CPR. Hospitalized 48 hours, all studies negative. She had vasovagal syncope due to pain.   They discharged her 2 days ago with instructions to follow up to adjust pain control. She was seen by Dr. Jeral Fruit for pain management and after she was taken off most of her medications last year due to worsening depression. She has been following with Dr. Ethelene Hal; her mood symptoms have been worsening, over the past 18 months with severe intractable insomnia. Dr. Noe Gens, through [facility], has agreed to follow patient evaluation for chronic pain and disability.   Patient states that she's still sore and notes there are still bruising over her chest. She did have a chest xray done that was negative. She's been forgetting to apply her lidoderm 5% patches. She was seen by Dr. Yevette Edwards for her chronic pain, and was instructed to see Dr. Regino Schultze for injections. She previously received numbing injections for relief from Dr. Regino Schultze; however, she received cortisone injections this time around and advised for physical therapy at [facility].   She reports that she stopped using her cpap machine. She notes "got tired of using it." She  takes xanax as needed, but still not sleeping well and anxiety is still uncontrolled. She has an appointment with Dr. Noe Gens tomorrow. She's been falling often and her granddaughter mentioned this to the patient's son. Her son has stopped the granddaughter's visits to the patient, which really upsets the patient. She states her right leg giving out sometimes, and other times, she would blackout for a few minutes.   She feels anxious and nervous at home. She worries about her grandchildren often. She would occasionally cry without reason. When someone mentions her daughter's name, the patient would become upset and think about past conversations with her daughter. She's taking gabapentin 2 bid. She denies SI. She reports that she will call for help if she hits a low point.   She had been taking xanax  quarter to half tablet over the course the day, and maybe another time at night. Decreased to  twice a day, #60.   Past Medical History  Diagnosis Date  . Hypertension   . Cholesterol serum increased   . Hypothyroidism   . Sleep apnea   . Migraine   . Depression   . Neuromuscular disorder (HCC)   . GERD (gastroesophageal reflux disease)   . Anxiety   . Chronic back pain   . Chronic kidney disease   . Tachycardia   . Hyperparathyroidism (HCC)   . Diastolic heart failure (HCC)    Prior to Admission medications   Medication Sig Start Date End Date Taking? Authorizing Provider  ALPRAZolam (XANAX) 1 MG tablet Take 1 tablet (1 mg total) by mouth 2 (two) times daily as needed for anxiety. 08/26/15   [name], MD  Calcium-Magnesium-Vitamin D (CALCIUM 1200+D3 PO) Take 1 tablet by mouth daily. Contains 800iu Vitamin D3    Historical Provider, MD  cetirizine (ZYRTEC) 10 MG tablet Take 1 tablet (10 mg total) by mouth at bedtime. Patient taking differently: Take 10 mg by mouth at bedtime as needed for allergies.  03/05/15   [name], MD  cholecalciferol (VITAMIN D) 1000 UNITS tablet Take 1  tablet (1,000 Units total) by mouth daily at 12 noon. 12/05/14   Adonis Brook, NP  DULoxetine (CYMBALTA) 60 MG capsule Take 1 capsule (60 mg total) by mouth daily. 06/06/15   [name], MD  esomeprazole (NEXIUM) 20 MG capsule Take 40 mg by mouth daily at 12 noon.    Historical Provider, MD  gabapentin (NEURONTIN) 300 MG capsule Take 2 capsules (600 mg total) by mouth 2 (two) times daily. 05/08/15   [name], MD  levothyroxine (SYNTHROID, LEVOTHROID) 50 MCG tablet TAKE 1 TABLET BY MOUTH EVERY DAY... MAX OF 30 DAYS ON INSURANCE 05/08/15   [name], MD  lidocaine (LIDODERM) 5 % PLACE 1 PATCH ONTO THE SKIN DAILY. REMOVE & DISCARD PATCH WITHIN 12 HOURS OR AS DIRECTED BY MD 04/12/15   Historical Provider, MD  rizatriptan (MAXALT) 10 MG tablet Take 0.5 tablets (5 mg total) by mouth as needed for migraine. May repeat in 2 hours if needed 01/17/15   [name], MD  topiramate (TOPAMAX) 100 MG tablet TAKE 1/2 TABLET BY MOUTH IN THE MORNING AND 1 TAB AT BEDTIME Patient taking differently: Take 50-100 mg by mouth 2 (two) times daily. TAKE 1/2 TABLET BY MOUTH IN THE MORNING AND 1 TAB AT BEDTIME 06/06/15   [name], MD   Allergies  Allergen Reactions  . Tramadol Nausea And Vomiting  . Trazodone And Nefazodone Other (See Comments)    Per pt trazodone caused hallucinations and behavior changes    Past Surgical History  Procedure Laterality Date  . Abdominal hysterectomy    . Back surgery    . Appendectomy     Family History  Problem Relation Age of Onset  . Diabetes Mother   . Heart disease Mother   . Kidney disease Mother   . Thyroid disease Mother   . Heart disease Father   . Seizures Father   . COPD Father   . Cancer Maternal Grandmother    Social History   Social History  . Marital Status: Married    Spouse Name: N/A  . Number of Children: N/A  . Years of Education: N/A   Social History Main Topics  . Smoking status: Never Smoker   . Smokeless tobacco: Never Used  . Alcohol Use:  No  . Drug Use: No  . Sexual Activity:    Partners: Male     Comment: married   Other Topics Concern  . None   Social History Narrative     Review of Systems  Constitutional: Positive for fatigue. Negative for fever and chills.  Respiratory: Negative for cough, shortness of breath and wheezing.   Cardiovascular: Positive for chest pain.  Musculoskeletal: Positive for myalgias, arthralgias and gait problem.  Psychiatric/Behavioral: Positive for sleep disturbance and dysphoric mood. Negative for suicidal ideas and self-injury. The patient is nervous/anxious.        Objective:   Physical Exam  Constitutional:  She is oriented to person, place, and time. She appears well-developed and well-nourished. No distress.  HENT:  Head: Normocephalic and atraumatic.  Eyes: EOM are normal. Pupils are equal, round, and reactive to light.  Neck: Neck supple.  Cardiovascular: Normal rate.   Pulmonary/Chest: Effort normal. No respiratory distress.  Musculoskeletal: Normal range of motion.  Neurological: She is alert and oriented to person, place, and time.  Skin: Skin is warm and dry.  Psychiatric: She has a normal mood and affect. Her behavior is normal.  Nursing note and vitals reviewed.   BP 132/88 mmHg  Pulse 105  Temp(Src) 97.7 F (36.5 C) (Oral)  Resp 18  Ht 5\' 3"  (1.6 m)  Wt 200 lb (90.719 kg)  BMI 35.44 kg/m2  SpO2 96%    Assessment & Plan:  Consider switching to Lyrica if gabapentin is causing to much sedation - try to increase gabapentin, pt resolves to have better compliance with cpap and topical lidocaine patches which do help reduce pain. Pt really wants to stay away from pain medication like the prior opiates which caused so much mental fogginess that she got very depressed.  Cyclobenzaprine caused to much sedation but will try the ER version qhs.  Consider trial of topical compounded cream or might be able to tolerate MS contin in future.  Has an appt with new psychologist  Dr Noe [facility]ensPeters at [facility] tomorrow.  Still main complaint of anxiety as well as depression but as anxiety is fairly constant and has been responding well to bid xanax ir will try ER version for better more consistent control  1. Neuromuscular disorder (HCC)   2. Neuropathy of right lower extremity   3. Encounter for long-term (current) use of medications   4. Anxiety and depression   5. Severe recurrent major depressive disorder with psychotic features (HCC)      Meds ordered this encounter  Medications  . gabapentin (NEURONTIN) 300 MG capsule    Sig: Take 2 capsules (600 mg total) by mouth 3 (three) times daily.    Dispense:  540 capsule    Refill:  3  . ALPRAZolam (XANAX XR) 1 MG 24 hr tablet    Sig: Take 1 tablet (1 mg total) by mouth daily.    Dispense:  30 tablet    Refill:  0  . cyclobenzaprine (AMRIX) 30 MG 24 hr capsule    Sig: Take 1 capsule (30 mg total) by mouth at bedtime as needed for muscle spasms.    Dispense:  30 capsule    Refill:  0   Over 40 min spent in face-to-face evaluation of and consultation with patient and coordination of care.  Over 50% of this time was spent counseling this patient.   I personally performed the services described in this documentation, which was scribed in my presence. The recorded information has been reviewed and considered, and addended by me as needed.  Norberto SorensonEva Dalton Molesworth, MD MPH

## 2015-09-03 NOTE — Patient Instructions (Addendum)
While you are recovering Start the xanax XR 1mg  in the morning - this should provide better coverage throughout the day rather than just wearing off.  Then you can take one of the immediately release regular Xanax 1mg  at night before sleep to help you fall asleep.  At night, you can try the amrix - this is an extended release muscle relaxant so if you are having back pain in the sides or leg cramping.  If you do not fall alseep, you can take a xanax 1mg  AFTER the alprazolam.  If you are getting worse, try calling the national suicide hotline - sometimes it is nice to have someone else who has been where you are on the other end of the phone.  [phone]   IF you received an x-ray today, you will receive an invoice from [facility]. Please contact [facility] at [phone] with questions or concerns regarding your invoice.   IF you received labwork today, you will receive an invoice from [facility]. Please contact [facility] at [phone] with questions or concerns regarding your invoice.   Our billing staff will not be able to assist you with questions regarding bills from these companies.  You will be contacted with the lab results as soon as they are available. The fastest way to get your results is to activate your My Chart account. Instructions are located on the last page of this paperwork. If you have not heard from us regarding the results in 2 weeks, please contact this office.     Suicidal Feelings: How to Help Yourself Suicide is the taking of one's own life. If you feel as though life is getting too tough to handle and are thinking about suicide, get help right away. To get help:  Call your local emergency services (911 in the [place]  Call a suicide hotline to speak with a trained counselor who understands how you are feeling. The following is a list of suicide hotlines in the [place]. For a list of hotlines in [place], visit  [employer].itwww.suicide.org/hotlines/international/[place]-suicide-hotlines.html.  1-800-273-TALK 609 190 5056(1-(585)654-9506).  1-800-SUICIDE 347-520-8146(1-786-160-6189).  425 099 12941-365-330-3849. This is a hotline for Spanish speakers.  5-643-329-5JOA1-800-799-4TTY 903 359 7231(1-510-312-6229). This is a hotline for TTY users.  1-866-4-U-TREVOR 325-729-6557(1-478 655 4197). This is a hotline for lesbian, gay, bisexual, transgender, or questioning youth.  Contact a crisis center or a local suicide prevention center. To find a crisis center or suicide prevention center:  Call your local hospital, clinic, community service organization, mental health center, social service provider, or health department. Ask for assistance in connecting to a crisis center.  Visit https://www.patel-[place].com/www.suicidepreventionlifeline.org/getinvolved/locator for a list of crisis centers in the [place], or visit www.suicideprevention.ca/thinking-about-suicide/find-a-crisis-centre for a list of centers in [place].  Visit the following websites:  National Suicide Prevention Lifeline: www.suicidepreventionlifeline.org  Hopeline: www.hopeline.com  [employer] for Suicide Prevention: https://www.ayers.com/www.afsp.org  The [employer] Project (for lesbian, gay, bisexual, transgender, or questioning youth): www.thetrevorproject.org HOW CAN I HELP MYSELF FEEL BETTER?  Promise yourself that you will not do anything drastic when you have suicidal feelings. Remember, there is hope. Many people have gotten through suicidal thoughts and feelings, and you will, too. You may have gotten through them before, and this proves that you can get through them again.  Let family, friends, teachers, or counselors know how you are feeling. Try not to isolate yourself from those who care about you. Remember, they will want to help you. Talk with someone every day, even if you do not feel sociable. Face-to-face conversation is best.  Call a mental health professional and see one regularly.  Visit your primary health care provider every  year.  Eat a well-balanced diet, and space your meals so you eat regularly.  Get plenty of rest.  Avoid alcohol and drugs, and remove them from your home. They will only make you feel worse.  If you are thinking of taking a lot of medicine, give your medicine to someone who can give it to you one day at a time. If you are on antidepressants and are concerned you will overdose, let your health care provider know so he or she can give you safer medicines. Ask your mental health professional about the possible side effects of any medicines you are taking.  Remove weapons, poisons, knives, and anything else that could harm you from your home.  Try to stick to routines. Follow a schedule every day. Put self-care on your schedule.  Make a list of realistic goals, and cross them off when you achieve them. Accomplishments give a sense of worth.  Wait until you are feeling better before doing the things you find difficult or unpleasant.  Exercise if you are able. You will feel better if you exercise for even a half hour each day.  Go out in the sun or into nature. This will help you recover from depression faster. If you have a favorite place to walk, go there.  Do the things that have always given you pleasure. Play your favorite music, read a good book, paint a picture, play your favorite instrument, or do anything else that takes your mind off your depression if it is safe to do.  Keep your living space well lit.  When you are feeling well, write yourself a letter about tips and support that you can read when you are not feeling well.  Remember that life's difficulties can be sorted out with help. Conditions can be treated. You can work on thoughts and strategies that serve you well.   This information is not intended to replace advice given to you by your health care provider. Make sure you discuss any questions you have with your health care provider.   Document Released: 11/07/2002  Document Revised: 05/24/2014 Document Reviewed: 08/28/2013 Elsevier Interactive Patient Education [employer]

## 2015-09-04 ENCOUNTER — Ambulatory Visit (INDEPENDENT_AMBULATORY_CARE_PROVIDER_SITE_OTHER): Payer: Commercial Managed Care - HMO | Admitting: Psychiatry

## 2015-09-04 DIAGNOSIS — F411 Generalized anxiety disorder: Secondary | ICD-10-CM | POA: Diagnosis not present

## 2015-09-05 ENCOUNTER — Telehealth: Payer: Self-pay

## 2015-09-05 NOTE — Telephone Encounter (Signed)
Dr Clelia CroftShaw, pharm faxed note that the Amrix card does not work for pt's who have Medicare D or other gov subsidized ins. I checked the card thinking that it could be run w/out ins, but it appears that it does only work for commercially insured pts (whether or not the ins covers it). Pharm listed preferred meds as cyclobenzaprine IR, baclofen, tizanidine, carisoprodol, methocarbamol. Do you want to Rx one of these?

## 2015-09-05 NOTE — Telephone Encounter (Signed)
No, thanks for checking. Just tell pt to trash it.

## 2015-09-05 NOTE — Telephone Encounter (Signed)
Dr Clelia CroftShaw, do you want to Rx the reg immed release cyclobenzaprine or other alternative?

## 2015-09-06 NOTE — Telephone Encounter (Signed)
NO I think pt had tried most of those w/o any sig relief - I had to really press her to try the amrix.  If she wants to try a muscle relaxer I would be happy to send one in but my understanding was that she just wanted to try the increase in the gabapentin and wearing her lidocaine patches more frequently and using her cpap every night to decrease her pain.  There is a topical compounding pharmacy that can mix muscle relaxants, pain medicines, numbing medicines together into a cream which might be a good next step for trial but I'm not sure about insurance coverage on those - [employer] [facility] online (or other [employer] online pharmacies) or [facility] or [facility] might be able to tell us if she will get insurance coverage of these if she is interested

## 2015-09-08 NOTE — Telephone Encounter (Signed)
Spoke to pt who reported that she believe she has tried flexeril in the past, but does not remember any of the other preferred meds I listed. I completed a PA on covermymeds to see if I can get Amrix covered (it was going to cost pt $1000) without ins). Pending.  Dr Clelia CroftShaw, pt did report that the combination of the extra dose of gabapentin and using her CPAP has improved her pain somewhat. She will hold off talking about any topical pain meds.at this point.

## 2015-09-09 MED ORDER — HYDROCODONE-ACETAMINOPHEN 7.5-325 MG/15ML PO SOLN: 5.0000 mL | ORAL | Status: DC | PRN

## 2015-09-09 MED ORDER — METHOCARBAMOL 750 MG PO TABS: 750.0000 mg | ORAL_TABLET | Freq: Four times a day (QID) | ORAL | Status: DC

## 2015-09-09 NOTE — Telephone Encounter (Signed)
Pt called - she is having severe chest pain from the CPR - seems to be getting worse or perhaps her tolerance of it is getting worse - can hardly stand it.  Can't cough or take a deep breath.   Would like to try a pain medication temporary for this which I encouraged.  Offered to recheck in 1-2d but pt ok with trying the pain medicine and muscle relaxer than recheck in 4-5d unless sub better. Warned off poss for pna, lung complications from atelectasis due to chest wall injury.

## 2015-09-09 NOTE — Telephone Encounter (Signed)
PA was denied because pt has not tried all of the preferred alternatives listed below. I don't believe from what you said previously, Dr Clelia CroftShaw, or in light of pt's report, that you want to Rx one of the alternatives pt hasn't tried but please advise and order if so.

## 2015-09-10 NOTE — Telephone Encounter (Addendum)
Rx for hyrocodone is in drawer. Dr Clelia CroftShaw has already discussed w/pt.

## 2015-09-18 ENCOUNTER — Ambulatory Visit (INDEPENDENT_AMBULATORY_CARE_PROVIDER_SITE_OTHER): Payer: Commercial Managed Care - HMO | Admitting: Psychiatry

## 2015-09-18 ENCOUNTER — Encounter: Payer: Self-pay | Admitting: Family Medicine

## 2015-09-18 ENCOUNTER — Ambulatory Visit (INDEPENDENT_AMBULATORY_CARE_PROVIDER_SITE_OTHER): Payer: Commercial Managed Care - HMO

## 2015-09-18 ENCOUNTER — Ambulatory Visit (INDEPENDENT_AMBULATORY_CARE_PROVIDER_SITE_OTHER): Payer: Commercial Managed Care - HMO | Admitting: Family Medicine

## 2015-09-18 VITALS — BP 132/80 | HR 88 | Temp 97.9°F | Resp 18 | Ht 63.0 in | Wt 210.0 lb

## 2015-09-18 DIAGNOSIS — Z79899 Other long term (current) drug therapy: Secondary | ICD-10-CM | POA: Diagnosis not present

## 2015-09-18 DIAGNOSIS — S2242XD Multiple fractures of ribs, left side, subsequent encounter for fracture with routine healing: Secondary | ICD-10-CM | POA: Diagnosis not present

## 2015-09-18 DIAGNOSIS — F411 Generalized anxiety disorder: Secondary | ICD-10-CM

## 2015-09-18 DIAGNOSIS — F339 Major depressive disorder, recurrent, unspecified: Secondary | ICD-10-CM

## 2015-09-18 DIAGNOSIS — M549 Dorsalgia, unspecified: Secondary | ICD-10-CM | POA: Diagnosis not present

## 2015-09-18 DIAGNOSIS — G8929 Other chronic pain: Secondary | ICD-10-CM | POA: Diagnosis not present

## 2015-09-18 DIAGNOSIS — R071 Chest pain on breathing: Secondary | ICD-10-CM | POA: Diagnosis not present

## 2015-09-18 DIAGNOSIS — S2220XA Unspecified fracture of sternum, initial encounter for closed fracture: Secondary | ICD-10-CM

## 2015-09-18 DIAGNOSIS — F064 Anxiety disorder due to known physiological condition: Secondary | ICD-10-CM

## 2015-09-18 DIAGNOSIS — R0781 Pleurodynia: Secondary | ICD-10-CM | POA: Diagnosis not present

## 2015-09-18 DIAGNOSIS — E039 Hypothyroidism, unspecified: Secondary | ICD-10-CM | POA: Diagnosis not present

## 2015-09-18 DIAGNOSIS — S2222XA Fracture of body of sternum, initial encounter for closed fracture: Secondary | ICD-10-CM | POA: Diagnosis not present

## 2015-09-18 MED ORDER — ALPRAZOLAM 1 MG PO TABS: 1.0000 mg | ORAL_TABLET | Freq: Two times a day (BID) | ORAL | Status: DC | PRN

## 2015-09-18 MED ORDER — MORPHINE SULFATE ER 15 MG PO TBCR: 15.0000 mg | EXTENDED_RELEASE_TABLET | Freq: Two times a day (BID) | ORAL | Status: DC

## 2015-09-18 MED ORDER — HYDROCODONE-ACETAMINOPHEN 7.5-325 MG/15ML PO SOLN: 5.0000 mL | Freq: Four times a day (QID) | ORAL | Status: DC | PRN

## 2015-09-18 MED ORDER — ALPRAZOLAM ER 1 MG PO TB24: 1.0000 mg | ORAL_TABLET | Freq: Every day | ORAL | Status: DC

## 2015-09-18 NOTE — Patient Instructions (Addendum)
IF you received an x-ray today, you will receive an invoice from [facility]. Please contact [facility] at [phone] with questions or concerns regarding your invoice.   IF you received labwork today, you will receive an invoice from [facility]. Please contact [facility] at [phone] with questions or concerns regarding your invoice.   Our billing staff will not be able to assist you with questions regarding bills from these companies.  You will be contacted with the lab results as soon as they are available. The fastest way to get your results is to activate your My Chart account. Instructions are located on the last page of this paperwork. If you have not heard from us regarding the results in 2 weeks, please contact this office.     Chest Wall Pain Chest wall pain is pain in or around the bones and muscles of your chest. Sometimes, an injury causes this pain. Sometimes, the cause may not be known. This pain may take several weeks or longer to get better. HOME CARE INSTRUCTIONS  Pay attention to any changes in your symptoms. Take these actions to help with your pain:   Rest as told by your health care provider.   Avoid activities that cause pain. These include any activities that use your chest muscles or your abdominal and side muscles to lift heavy items.   If directed, apply ice to the painful area:  Put ice in a plastic bag.  Place a towel between your skin and the bag.  Leave the ice on for 20 minutes, 2-3 times per day.  Take over-the-counter and prescription medicines only as told by your health care provider.  Do not use tobacco products, including cigarettes, chewing tobacco, and e-cigarettes. If you need help quitting, ask your health care provider.  Keep all follow-up visits as told by your health care provider. This is important. SEEK MEDICAL CARE IF:  You have a fever.  Your chest pain becomes  worse.  You have new symptoms. SEEK IMMEDIATE MEDICAL CARE IF:  You have nausea or vomiting.  You feel sweaty or light-headed.  You have a cough with phlegm (sputum) or you cough up blood.  You develop shortness of breath.   This information is not intended to replace advice given to you by your health care provider. Make sure you discuss any questions you have with your health care provider.   Document Released: 05/03/2005 Document Revised: 01/22/2015 Document Reviewed: 07/29/2014 Elsevier Interactive Patient Education 2016 Elsevier Inc.  Blunt Chest Trauma Blunt chest trauma is an injury caused by a blow to the chest. These chest injuries can be very painful. Blunt chest trauma often results in bruised or broken (fractured) ribs. Most cases of bruised and fractured ribs from blunt chest traumas get better after 1 to 3 weeks of rest and pain medicine. Often, the soft tissue in the chest wall is also injured, causing pain and bruising. Internal organs, such as the heart and lungs, may also be injured. Blunt chest trauma can lead to serious medical problems. This injury requires immediate medical care. CAUSES   Motor vehicle collisions.  Falls.  Physical violence.  Sports injuries. SYMPTOMS   Chest pain. The pain may be worse when you move or breathe deeply.  Shortness of breath.  Lightheadedness.  Bruising.  Tenderness.  Swelling. DIAGNOSIS  Your caregiver will do a physical exam. X-rays may be taken to look for fractures. However, minor rib fractures may not show up on  X-rays until a few days after the injury. If a more serious injury is suspected, further imaging tests may be done. This may include ultrasounds, computed tomography (CT) scans, or magnetic resonance imaging (MRI). TREATMENT  Treatment depends on the severity of your injury. Your caregiver may prescribe pain medicines and deep breathing exercises. HOME CARE INSTRUCTIONS  Limit your activities until you  can move around without much pain.  Do not do any strenuous work until your injury is healed.  Put ice on the injured area.  Put ice in a plastic bag.  Place a towel between your skin and the bag.  Leave the ice on for 15-20 minutes, 03-04 times a day.  You may wear a rib belt as directed by your caregiver to reduce pain.  Practice deep breathing as directed by your caregiver to keep your lungs clear.  Only take over-the-counter or prescription medicines for pain, fever, or discomfort as directed by your caregiver. SEEK IMMEDIATE MEDICAL CARE IF:   You have increasing pain or shortness of breath.  You cough up blood.  You have nausea, vomiting, or abdominal pain.  You have a fever.  You feel dizzy, weak, or you faint. MAKE SURE YOU:  Understand these instructions.  Will watch your condition.  Will get help right away if you are not doing well or get worse.   This information is not intended to replace advice given to you by your health care provider. Make sure you discuss any questions you have with your health care provider.   Document Released: 06/10/2004 Document Revised: 05/24/2014 Document Reviewed: 10/30/2014 Elsevier Interactive Patient Education [employer]

## 2015-09-18 NOTE — Progress Notes (Signed)
Subjective:    Patient ID: Jennifer Davidson, female    DOB: 03/18/1956, 60 y.o.   MRN: [id number] Chief Complaint  Patient presents with  . Follow-up    states it hurts to cough/breath  . Chest Pain    states her chest hurts since being released from hospital    HPI  Jennifer Davidson is not doing well - she is in a lot of pain with any movement. Makes it hard to sleep - wearing her cpap every night but just trying to turn over in bed is a very painful production.  She has started seeing Dr. Enzo BiJeanne Peters with behavioral health - had her second appt today which went well.  Jennifer Davidson likes Dr. Noe GensPeters who encouraged her to do more self-care activities - encouraged her to force herself to get out of the house and do things she enjoys so did go get a mani/pedi.  Her chest wall is still edquisutely tender to any movement, deep breathing.  Has started taking 2.5 mg of hydrocodone elixir qam - sometimes a second dose during the day and then 5mg  of hydrocodone at night before bed.  It does help ease the pain - gives her a little break from the severity. However, her husband is keeping all of her sedative/controlled meds and doesn't want her taking them - states her eyes look different when she takes even the 2.5mg - -she looks "high." Pt doesn't feel high - no euphoria, fatigue, or mental cloudiness with this.  Loraine LericheMark is gone at work from evening to a.m. Working night shift so pt does not have any access to the medications during this time.  She has been wearing her cpap every night and putting one one 4% lidocaine patch every morning - takes off after 12 hrs.  Is using the gabapentin 600mg  tid which is helping and no sedation or side effects.  The methocarbamol didn't seem to make much of a difference.  She has not had a vasovagal episode since her hosp 3 wks ago - if she continue to not have another, she might get to see her granddaughter again next week which is her reason for living - the biggest joy and what she looks  forward to - so has been really difficult to not be able to see her (her parents stopped allowing granddaughter to stay with Jennifer Davidson when they learned she had been having multiple syncopal episodes which were determined to be vasovagal due to severity of untreated chronic pain).  Still on the cymbalta 60mg  but depression is uncontrolled so Dr. Noe GensPeters encouraged her to consider changing. Does have occ suicidal thoughts but no full ideation and no plan - able and willing to contract for safely quite quickly.  Past Medical History  Diagnosis Date  . Hypertension   . Cholesterol serum increased   . Hypothyroidism   . Sleep apnea   . Migraine   . Depression   . Neuromuscular disorder (HCC)   . GERD (gastroesophageal reflux disease)   . Anxiety   . Chronic back pain   . Chronic kidney disease   . Tachycardia   . Hyperparathyroidism (HCC)   . Diastolic heart failure [facility](HCC)    Past Surgical History  Procedure Laterality Date  . Abdominal hysterectomy    . Back surgery    . Appendectomy     Current Outpatient Prescriptions on File Prior to Visit  Medication Sig Dispense Refill  . Calcium-Magnesium-Vitamin D (CALCIUM 1200+D3 PO) Take 1 tablet by mouth daily.  Contains 800iu Vitamin D3    . cetirizine (ZYRTEC) 10 MG tablet Take 1 tablet (10 mg total) by mouth at bedtime. (Patient taking differently: Take 10 mg by mouth at bedtime as needed for allergies. ) 90 tablet 3  . cholecalciferol (VITAMIN D) 1000 UNITS tablet Take 1 tablet (1,000 Units total) by mouth daily at 12 noon. 30 tablet 0  . DULoxetine (CYMBALTA) 60 MG capsule Take 1 capsule (60 mg total) by mouth daily. 90 capsule 3  . esomeprazole (NEXIUM) 20 MG capsule Take 40 mg by mouth daily at 12 noon.    . gabapentin (NEURONTIN) 300 MG capsule Take 2 capsules (600 mg total) by mouth 3 (three) times daily. 540 capsule 3  . levothyroxine (SYNTHROID, LEVOTHROID) 50 MCG tablet TAKE 1 TABLET BY MOUTH EVERY DAY... MAX OF 30 DAYS ON INSURANCE 90  tablet 1  . lidocaine (LIDODERM) 5 % PLACE 1 PATCH ONTO THE SKIN DAILY. REMOVE & DISCARD PATCH WITHIN 12 HOURS OR AS DIRECTED BY MD  11  . methocarbamol (ROBAXIN) 750 MG tablet Take 1 tablet (750 mg total) by mouth 4 (four) times daily. 60 tablet 0  . rizatriptan (MAXALT) 10 MG tablet Take 0.5 tablets (5 mg total) by mouth as needed for migraine. May repeat in 2 hours if needed 10 tablet 5  . topiramate (TOPAMAX) 100 MG tablet TAKE 1/2 TABLET BY MOUTH IN THE MORNING AND 1 TAB AT BEDTIME (Patient taking differently: Take 50-100 mg by mouth 2 (two) times daily. TAKE 1/2 TABLET BY MOUTH IN THE MORNING AND 1 TAB AT BEDTIME) 135 tablet 1   No current facility-administered medications on file prior to visit.   Allergies  Allergen Reactions  . Tramadol Nausea And Vomiting  . Trazodone And Nefazodone Other (See Comments)    Per pt trazodone caused hallucinations and behavior changes    Family History  Problem Relation Age of Onset  . Diabetes Mother   . Heart disease Mother   . Kidney disease Mother   . Thyroid disease Mother   . Heart disease Father   . Seizures Father   . COPD Father   . Cancer Maternal Grandmother    Social History   Social History  . Marital Status: Married    Spouse Name: N/A  . Number of Children: N/A  . Years of Education: N/A   Social History Main Topics  . Smoking status: Never Smoker   . Smokeless tobacco: Never Used  . Alcohol Use: No  . Drug Use: No  . Sexual Activity:    Partners: Male     Comment: married   Other Topics Concern  . None   Social History Narrative       Review of Systems  Constitutional: Positive for activity change. Negative for fever and chills. Unexpected weight change:  gain.  Respiratory: Positive for apnea. Negative for cough, chest tightness, shortness of breath, wheezing and stridor.   Cardiovascular: Positive for chest pain. Negative for palpitations and leg swelling.  Gastrointestinal: Negative for vomiting.    Musculoskeletal: Positive for myalgias, back pain, joint swelling, arthralgias and gait problem.  Skin: Negative for color change and rash.  Allergic/Immunologic: Negative for immunocompromised state.  Neurological: Positive for weakness. Negative for dizziness, seizures, syncope, facial asymmetry, speech difficulty, light-headedness and numbness.  Psychiatric/Behavioral: Positive for sleep disturbance.       Objective:  BP 132/80 mmHg  Pulse 88  Temp(Src) 97.9 F (36.6 C) (Oral)  Resp 18  Ht  (1.6  m)  Wt 210 lb (95.255 kg)  BMI 37.21 kg/m2  SpO2 95%  Physical Exam  Constitutional: She is oriented to person, place, and time. She appears well-developed and well-nourished. No distress.  HENT:  Head: Normocephalic and atraumatic.  Right Ear: External ear normal.  Left Ear: External ear normal.  Eyes: Conjunctivae are normal. No scleral icterus.  Neck: Normal range of motion. Neck supple. No thyromegaly present.  Cardiovascular: Normal rate, regular rhythm, normal heart sounds and intact distal pulses.   Pulmonary/Chest: Effort normal. No respiratory distress. She has decreased breath sounds. She has no wheezes. She has no rales. She exhibits tenderness and bony tenderness. She exhibits no mass, no deformity and no swelling.  Musculoskeletal: She exhibits no edema.  Lymphadenopathy:    She has no cervical adenopathy.  Neurological: She is alert and oriented to person, place, and time.  Skin: Skin is warm and dry. She is not diaphoretic. No erythema.  Psychiatric: Her speech is normal. Judgment normal. She is slowed. Cognition and memory are normal. She exhibits a depressed mood. She expresses suicidal (improving) ideation. She expresses no suicidal plans.          Assessment & Plan:   1. Pain of anterior chest wall with respiration   2. Sternal fracture, closed, initial encounter - mildly displaced so will call CT surg to ask for guidance on management - I am worried  that pt could be a risk for non-union since the pain from this has not really improved at all over the past 3 wks.  Perhaps CT or MRI if she continues to have no improvement in sxs???  Advised bracing with pillow for any cough/laugh/deep breath.  No lifting at all.  Try to switch to walker instead of cane to even out weight/pressure that is radiating up arms into chest. Ice 20 min q2-3 hrs.    3. Fracture three ribs-closed, left, with routine healing, subsequent encounter   4. Chronic back pain - I do not think there is anyway that we are going to achieve adequate pain control to allow Noa to live her life without narcotics due to the severity of her condition.  Start trial of low dose sched MSContin - reviewed side effects w/ pt and husband sev times and agree to try  5. Major depression, recurrent, chronic (HCC) - may need to change cymbalta? However, a lot of sxs are due to severity of pain and not being able to see her granddaughter so I do hope that her mood improves as we treat the exacerbating factors  6. Generalized anxiety disorder - much improved on xanax XR 1mg  qam and xanax IR 1mg  qhs - ok to cont.  7. Encounter for long-term (current) use of medications - Ok to cont hydrocodone - pt is taking very low doses and so I encouraged her husband [name] to let pt continue to use as needed - that the nature of her injury really is going to require narcotic pain medication for any sig relief and that Serenitee has always used her medication only has prescribed.  Though she has admitted to thoughts of OD in past, she has always kept her safety contract and asked from help from [place] and myself before carrying through and I do think her depression while severe has at least had the psychotic features resolve - she has never done anything that makes me concerned that I can't trust her with her sedative meds even despite admitted occ OD thoughts prior.  8.  Hypothyroidism, unspecified hypothyroidism type - tsh has  been stable with dose unchanged for over 4 years     Orders Placed This Encounter  Procedures  . DG Sternum    Standing Status: Future     Number of Occurrences: 1     Standing Expiration Date: 09/17/2016    Order Specific Question:  Reason for Exam (SYMPTOM  OR DIAGNOSIS REQUIRED)    Answer:  continued severe pain after >5 min of CPR performed on pt 3 wks prior    Order Specific Question:  Preferred imaging location?    Answer:  External  . DG Ribs Unilateral W/Chest Left    Standing Status: Future     Number of Occurrences: 1     Standing Expiration Date: 09/17/2016    Order Specific Question:  Reason for Exam (SYMPTOM  OR DIAGNOSIS REQUIRED)    Answer:  continued severe pain after >5 min of CPR performed on pt 3 wks prior    Order Specific Question:  Preferred imaging location?    Answer:  External    Meds ordered this encounter  Medications  . morphine (MS CONTIN) 15 MG 12 hr tablet    Sig: Take 1 tablet (15 mg total) by mouth every 12 (twelve) hours.    Dispense:  60 tablet    Refill:  0  . ALPRAZolam (XANAX XR) 1 MG 24 hr tablet    Sig: Take 1 tablet (1 mg total) by mouth daily.    Dispense:  30 tablet    Refill:  1  . ALPRAZolam (XANAX) 1 MG tablet    Sig: Take 1 tablet (1 mg total) by mouth 2 (two) times daily as needed for anxiety.    Dispense:  60 tablet    Refill:  1  . HYDROcodone-acetaminophen (HYCET) 7.5-325 mg/15 ml solution    Sig: Take 5-10 mLs by mouth every 6 (six) hours as needed for moderate pain.    Dispense:  240 mL    Refill:  0    Norberto Sorenson, MD MPH

## 2015-09-19 ENCOUNTER — Telehealth: Payer: Self-pay | Admitting: *Deleted

## 2015-09-19 ENCOUNTER — Other Ambulatory Visit: Payer: Self-pay | Admitting: *Deleted

## 2015-09-19 DIAGNOSIS — S2220XG Unspecified fracture of sternum, subsequent encounter for fracture with delayed healing: Secondary | ICD-10-CM

## 2015-09-19 NOTE — Telephone Encounter (Signed)
Ordered but pt had CKD stage 3 with eGFR being 39 3 wks ago so unsure if it would be safe to give her IV contrast

## 2015-09-19 NOTE — Telephone Encounter (Signed)
Sp[facility] with [facility] at [facility] and they can just do a reduced dose of contrast.

## 2015-09-19 NOTE — Telephone Encounter (Signed)
Johnny BridgeMartha called from Dr. Harmon PierVanwrights office wants you to order a CT Chest with IV contrast before the pt comes to them for her appt on Tuesday at 3pm.  She looked at your notes and saw that you had mentioned something about getting one done.  Can call her with any questions at [phone]

## 2015-09-22 ENCOUNTER — Other Ambulatory Visit: Payer: Self-pay | Admitting: *Deleted

## 2015-09-22 ENCOUNTER — Telehealth: Payer: Self-pay | Admitting: Emergency Medicine

## 2015-09-22 ENCOUNTER — Telehealth: Payer: Self-pay | Admitting: *Deleted

## 2015-09-22 ENCOUNTER — Other Ambulatory Visit: Payer: Self-pay | Admitting: Family Medicine

## 2015-09-22 ENCOUNTER — Ambulatory Visit
Admission: RE | Admit: 2015-09-22 | Discharge: 2015-09-22 | Disposition: A | Discharge status: Home / Self Care | Payer: Commercial Managed Care - HMO | Source: Ambulatory Visit | Attending: Emergency Medicine | Admitting: Emergency Medicine

## 2015-09-22 DIAGNOSIS — S2220XG Unspecified fracture of sternum, subsequent encounter for fracture with delayed healing: Secondary | ICD-10-CM

## 2015-09-22 DIAGNOSIS — S2222XA Fracture of body of sternum, initial encounter for closed fracture: Secondary | ICD-10-CM | POA: Diagnosis not present

## 2015-09-22 NOTE — Telephone Encounter (Addendum)
Please try to call my cell phone if there is a question about an urgent matter.  It was ordered with contrast because that's how Dr. Camelia Eng requested it be done prior to pt's office visit tomorrow for her displaced sternal fracture with delayed healing.  We called radiology and they thought it would be fine to just have a lower dose of contrast.  Please call Dr. Festus Barren office at [phone] to see if IV contrast will be significantly more helpful in the pt with CKD stage 3 eGFR 39.

## 2015-09-22 NOTE — Telephone Encounter (Signed)
Wonderful, thank you

## 2015-09-22 NOTE — Telephone Encounter (Signed)
Dr. Clelia CroftShaw, Spoke with the nurse Sherre PootJolene Carter at Dr. Inda MerlinVanwright office. She said Ct without contrast is fine.

## 2015-09-22 NOTE — Telephone Encounter (Signed)
Dr. Clelia CroftShaw this patient was scheduled for an STAT CT Chest with Contrast.  However, [facility] and they wanted to know why it was being done with Contrast with her current medical condition.  Dr Cleta Albertsaub reviewed her chart and your notes and suggested that we change it to without contrast.  He saw that she has an appt with Dr. Harmon PierVanwrights on tomorrow and he may change it to contrast per his request.

## 2015-09-23 ENCOUNTER — Telehealth: Payer: Self-pay

## 2015-09-23 NOTE — Telephone Encounter (Signed)
I sent the CT results to the rad pool this morning as well as released them to MyChart - it was exactly what we thought - a mildly displaced scapular fracture and 3 healing rib fractures.  Pt has an appt with Dr. Alla GermanVanTright today who will be the person who decides what if anything needs to be done.  For now, keep with no lifting and try to use walker instead of cane so that weight is evenly distributed across chest.

## 2015-09-23 NOTE — Telephone Encounter (Signed)
Pt is calling to see about ct results and what if anything needs to be done next   Best number [phone] husband

## 2015-09-24 NOTE — Telephone Encounter (Signed)
Message sent to mychart

## 2015-09-25 ENCOUNTER — Institutional Professional Consult (permissible substitution) (INDEPENDENT_AMBULATORY_CARE_PROVIDER_SITE_OTHER): Payer: Commercial Managed Care - HMO | Admitting: Cardiothoracic Surgery

## 2015-09-25 VITALS — BP 129/75 | HR 97 | Resp 16 | Ht 63.0 in | Wt 200.0 lb

## 2015-09-25 DIAGNOSIS — S2220XA Unspecified fracture of sternum, initial encounter for closed fracture: Secondary | ICD-10-CM | POA: Diagnosis not present

## 2015-09-25 DIAGNOSIS — S2232XA Fracture of one rib, left side, initial encounter for closed fracture: Secondary | ICD-10-CM

## 2015-09-25 NOTE — Progress Notes (Signed)
PCP is Norberto Sorenson, MD Referring Provider is [name], MD  Chief Complaint  Patient presents with  . Rib Fracture    multiple and sternal fracture...Marland KitchenMarland Kitcheneval with CT CHEST  patient examined,` CT scan of chance to personally reviewed and counseled with patient.  HPI:60 year old obese Caucasian female with history of anxiety-depression and chronic back pain presents for evaluation of bilateral rib fractures and a nondisplaced sternal fracture. The rib and sternal fractures were sustained when she had a syncopal episode outside of her house, related to her extreme low back pain, pain medication, and possible vasovagal reaction. Family and friends immediately came to her assistance and started chest compressions. When EMS arrived she was noted to have pulse and normal cardiac rhythm and CPR was stopped. She is admitted to the hospital. She is evaluated for a syncopal episode and also x-rayed for rib fractures. She did not have a pneumothorax or pleural effusion. She was started on pain medication. After a three-day hospitalization she was discharged to home. Dr. [name] her primary physician has health the patient through this traumatic experience and has helped with her chest wall pain from the rib and sternal fractures.the patient is a nonsmoker. Fortunately she has not developed symptoms of pneumonia.she is using a back brace across her lower chest to help splint the chest wall.  The patient is taking hydrocodone elixir to help with pain and for cough suppression. The patient was started on long-term MS Contin 15 mg every 12 hours but this has left her over sedated and confused according to her spouse. The patient was started on Xanax 1 mg at bedtime.  The patient's baseline pain medication for her low back and anxiety include Xanax XL 1 mg daily and Neurontin 600 mg 3 times a day for back pain.  The patient states she cannot take NaprosynOr Advil because of renal dysfunction Past Medical History   Diagnosis Date  . Hypertension   . Cholesterol serum increased   . Hypothyroidism   . Sleep apnea   . Migraine   . Depression   . Neuromuscular disorder (HCC)   . GERD (gastroesophageal reflux disease)   . Anxiety   . Chronic back pain   . Chronic kidney disease   . Tachycardia   . Hyperparathyroidism (HCC)   . Diastolic heart failure [facility])     Past Surgical History  Procedure Laterality Date  . Abdominal hysterectomy    . Back surgery    . Appendectomy      Family History  Problem Relation Age of Onset  . Diabetes Mother   . Heart disease Mother   . Kidney disease Mother   . Thyroid disease Mother   . Heart disease Father   . Seizures Father   . COPD Father   . Cancer Maternal Grandmother     Social History Social History  Substance Use Topics  . Smoking status: Never Smoker   . Smokeless tobacco: Never Used  . Alcohol Use: No    Current Outpatient Prescriptions  Medication Sig Dispense Refill  . ALPRAZolam (XANAX XR) 1 MG 24 hr tablet Take 1 tablet (1 mg total) by mouth daily. 30 tablet 1  . Calcium-Magnesium-Vitamin D (CALCIUM 1200+D3 PO) Take 1 tablet by mouth daily. Contains 800iu Vitamin D3    . cetirizine (ZYRTEC) 10 MG tablet Take 1 tablet (10 mg total) by mouth at bedtime. (Patient taking differently: Take 10 mg by mouth at bedtime as needed for allergies. ) 90 tablet 3  .  cholecalciferol (VITAMIN D) 1000 UNITS tablet Take 1 tablet (1,000 Units total) by mouth daily at 12 noon. 30 tablet 0  . DULoxetine (CYMBALTA) 60 MG capsule Take 1 capsule (60 mg total) by mouth daily. 90 capsule 3  . esomeprazole (NEXIUM) 20 MG capsule Take 40 mg by mouth daily at 12 noon.    . gabapentin (NEURONTIN) 300 MG capsule Take 2 capsules (600 mg total) by mouth 3 (three) times daily. 540 capsule 3  . levothyroxine (SYNTHROID, LEVOTHROID) 50 MCG tablet TAKE 1 TABLET EVERY DAY 90 tablet 0  . lidocaine (LIDODERM) 5 % PLACE 1 PATCH ONTO THE SKIN DAILY. REMOVE & DISCARD PATCH  WITHIN 12 HOURS OR AS DIRECTED BY MD  11  . methocarbamol (ROBAXIN) 750 MG tablet Take 1 tablet (750 mg total) by mouth 4 (four) times daily. 60 tablet 0  . rizatriptan (MAXALT) 10 MG tablet Take 0.5 tablets (5 mg total) by mouth as needed for migraine. May repeat in 2 hours if needed 10 tablet 5  . topiramate (TOPAMAX) 100 MG tablet TAKE 1/2 TABLET BY MOUTH IN THE MORNING AND 1 TAB AT BEDTIME (Patient taking differently: Take 50-100 mg by mouth 2 (two) times daily. TAKE 1/2 TABLET BY MOUTH IN THE MORNING AND 1 TAB AT BEDTIME) 135 tablet 1  . ALPRAZolam (XANAX) 1 MG tablet Take 1 tablet (1 mg total) by mouth 2 (two) times daily as needed for anxiety. (Patient not taking: Reported on 09/25/2015) 60 tablet 1  . HYDROcodone-acetaminophen (HYCET) 7.5-325 mg/15 ml solution Take 5-10 mLs by mouth every 6 (six) hours as needed for moderate pain. (Patient not taking: Reported on 09/25/2015) 240 mL 0  . morphine (MS CONTIN) 15 MG 12 hr tablet Take 1 tablet (15 mg total) by mouth every 12 (twelve) hours. (Patient not taking: Reported on 09/25/2015) 60 tablet 0   No current facility-administered medications for this visit.    Allergies  Allergen Reactions  . Tramadol Nausea And Vomiting  . Trazodone And Nefazodone Other (See Comments)    Per pt trazodone caused hallucinations and behavior changes     Review of Systems         Review of Systems :  [ y ] = yes, [  ] = no        General :  Weight gain [   ]    Weight loss  [   ]  Fatigue [  ]  Fever [  ]  Chills  [  ]                                Weakness  [  ]           HEENT    Headache [  yes]  Dizziness [  ]  Blurred vision [  ] Glaucoma  [  ]                          Nosebleeds [  ] Painful or loose teeth [  ]        Cardiac :  Chest pain/ pressure [  ]  Resting SOB [  ] exertional SOB [  ]                        Orthopnea [  ]  Pedal edema  [  ]  Palpitations [  ]  Syncope/presyncope Mahler.Beck ]                        Paroxysmal nocturnal dyspnea [   ]         Pulmonary : cough [  ]  wheezing [  ]  Hemoptysis [  ] Sputum [  ] Snoring [  ]                              Pneumothorax [  ]  Sleep apnea Mahler.Beck  ]        GI : Vomiting [  ]  Dysphagia [ yes ]  Melena  [  ]  Abdominal pain [  ] BRBPR [  ]              Heart burn [ yes ]  Constipation [  ] Diarrhea  [  ] Colonoscopy [ yes  ]        GU : Hematuria [  ]  Dysuria [  ]  Nocturia [  ] UTI's [  ]        Vascular : Claudication [  ]  Rest pain [  ]  DVT [  ] Vein stripping [  ] leg ulcers [  ]                          TIA [  ] Stroke [  ]  Varicose veins [  ]positive for leg cramps        NEURO :  Headaches  [  ] Seizures [  ] Vision changes [  ] Paresthesias [  ]                                       Seizures [  ]        Musculoskeletal :  Arthritis [ yes ] Gout  [  ]  Back pain Mahler.Beck  ]  Joint pain [  ]        Skin :  Rash [  ]  Melanoma [  ] Sores [  ]        Heme : Bleeding problems [  ]Clotting Disorders [  ] Anemia [  ]Blood Transfusion         Endocrine : Diabetes [  ] Heat or Cold intolerance [  ] Polyuria [  ]excessive thirst         Psych : Depression [ yes ]  Anxiety [ yes ]  Psych hospitalizations [ the ureteral health ] Memory change [  ]                                               BP 129/75 mmHg  Pulse 97  Resp 16  Ht  (1.6 m)  Wt 200 lb (90.719 kg)  BMI 35.44 kg/m2  SpO2 90% Physical Exam       Physical Exam  General: obese anxious Caucasian female no acute distress but in obvious discomfort from rib fractures HEENT: Normocephalic pupils equal , dentition adequate Neck: Supple without JVD, adenopathy, or bruit Chest: Clear to auscultation, symmetrical  breath sounds, no rhonchi, positive tenderness            without deformity over midsternum, right lateral third rib and left lateral third fourth and fifth ribs Cardiovascular: Regular rate and rhythm, no murmur, no gallop, peripheral pulses             palpable in all extremities Abdomen:   Soft, nontender, no palpable mass or organomegaly Extremities: Warm, well-perfused, no clubbing cyanosisor tenderness,2+ edema bilaterally              no venous stasis changes of the legs Rectal/GU: Deferred Neuro: Grossly non--focal and symmetrical throughout Skin: Clean and dry without rash or ulceration   Diagnostic Tests: CT scan of the chest personally reviewed. This shows right third lateral rib and left lateral third fourth and fifth rib fractures now starting healed minimal displacement. No evidence of pulmonary parenchymal contusion, no pleural effusion The sternum has a slight fracture of the outer table which is also healing  Impression: Bilateral rib fractures and small sternal fracture. These are all healing and CT scan performed today compared with x-rays last month at the time of the injury.  The patient was reassured that her fracture healing and that her pain should start to improve now that she is almost 4 weeks post injury. I recommended to the patient that she take every 6 hour Tylenol 500 mg, stop taking the MS Contin, stop taking a nighttime dose of Xanax, and to use the hydrocodone elixir as needed. She is also encouraged to keep a small firm pillow to splint her chest when she feels a need to cough and also to hold the pillow against her chest to avoid using her upper chest and upper arms when she rises out of a chair or out of bed  Plan: Return for followup visit in 3-4 weeks chest x-ray  Mikey BussingPeter Van Trigt III, MD [facility] [phone]

## 2015-10-02 ENCOUNTER — Ambulatory Visit: Payer: Commercial Managed Care - HMO | Admitting: Psychiatry

## 2015-10-02 DIAGNOSIS — Z9989 Dependence on other enabling machines and devices: Secondary | ICD-10-CM | POA: Diagnosis not present

## 2015-10-02 DIAGNOSIS — I10 Essential (primary) hypertension: Secondary | ICD-10-CM | POA: Diagnosis not present

## 2015-10-02 DIAGNOSIS — N189 Chronic kidney disease, unspecified: Secondary | ICD-10-CM | POA: Diagnosis not present

## 2015-10-02 DIAGNOSIS — G4733 Obstructive sleep apnea (adult) (pediatric): Secondary | ICD-10-CM | POA: Diagnosis not present

## 2015-10-02 DIAGNOSIS — E785 Hyperlipidemia, unspecified: Secondary | ICD-10-CM | POA: Diagnosis not present

## 2015-10-02 DIAGNOSIS — G709 Myoneural disorder, unspecified: Secondary | ICD-10-CM | POA: Diagnosis not present

## 2015-10-02 DIAGNOSIS — E039 Hypothyroidism, unspecified: Secondary | ICD-10-CM | POA: Diagnosis not present

## 2015-10-02 DIAGNOSIS — N2581 Secondary hyperparathyroidism of renal origin: Secondary | ICD-10-CM | POA: Diagnosis not present

## 2015-10-02 DIAGNOSIS — D631 Anemia in chronic kidney disease: Secondary | ICD-10-CM | POA: Diagnosis not present

## 2015-10-02 DIAGNOSIS — N183 Chronic kidney disease, stage 3 (moderate): Secondary | ICD-10-CM | POA: Diagnosis not present

## 2015-10-07 ENCOUNTER — Other Ambulatory Visit: Payer: Self-pay | Admitting: Family Medicine

## 2015-10-07 ENCOUNTER — Ambulatory Visit: Payer: Commercial Managed Care - HMO | Admitting: Psychiatry

## 2015-10-14 ENCOUNTER — Other Ambulatory Visit: Payer: Self-pay | Admitting: Cardiothoracic Surgery

## 2015-10-14 DIAGNOSIS — S2239XD Fracture of one rib, unspecified side, subsequent encounter for fracture with routine healing: Secondary | ICD-10-CM

## 2015-10-15 ENCOUNTER — Ambulatory Visit (INDEPENDENT_AMBULATORY_CARE_PROVIDER_SITE_OTHER): Payer: Commercial Managed Care - HMO | Admitting: Cardiothoracic Surgery

## 2015-10-15 ENCOUNTER — Ambulatory Visit
Admission: RE | Admit: 2015-10-15 | Discharge: 2015-10-15 | Disposition: A | Discharge status: Home / Self Care | Payer: Commercial Managed Care - HMO | Source: Ambulatory Visit | Attending: Cardiothoracic Surgery | Admitting: Cardiothoracic Surgery

## 2015-10-15 ENCOUNTER — Encounter: Payer: Self-pay | Admitting: Cardiothoracic Surgery

## 2015-10-15 VITALS — BP 124/80 | HR 96 | Resp 20 | Ht 63.0 in | Wt 200.0 lb

## 2015-10-15 DIAGNOSIS — S2220XA Unspecified fracture of sternum, initial encounter for closed fracture: Secondary | ICD-10-CM

## 2015-10-15 DIAGNOSIS — S2232XA Fracture of one rib, left side, initial encounter for closed fracture: Secondary | ICD-10-CM | POA: Diagnosis not present

## 2015-10-15 DIAGNOSIS — S2239XD Fracture of one rib, unspecified side, subsequent encounter for fracture with routine healing: Secondary | ICD-10-CM

## 2015-10-15 DIAGNOSIS — R0602 Shortness of breath: Secondary | ICD-10-CM | POA: Diagnosis not present

## 2015-10-15 NOTE — Progress Notes (Signed)
PCP is Norberto Sorenson, MD Referring Provider is [name], MD  Chief Complaint  Patient presents with  . Rib Fracture    3 week f/u with CXR    HPI: Return visit for left-sided rib fractures and upper sternal fracture sustained over 2 months ago when she fainted and received CPR with good intention from a friend. Her pain is improved. Her range of motion of her upper extremities is improved. She is start driving short distances. She is weaning herself off narcotics and is taking Tylenol 3 times a day. She is sleeping better.  Chest x-ray today shows improvement of the appearance of the fractures no only faintly visible.  Past Medical History  Diagnosis Date  . Hypertension   . Cholesterol serum increased   . Hypothyroidism   . Sleep apnea   . Migraine   . Depression   . Neuromuscular disorder (HCC)   . GERD (gastroesophageal reflux disease)   . Anxiety   . Chronic back pain   . Chronic kidney disease   . Tachycardia   . Hyperparathyroidism (HCC)   . Diastolic heart failure [facility])     Past Surgical History  Procedure Laterality Date  . Abdominal hysterectomy    . Back surgery    . Appendectomy      Family History  Problem Relation Age of Onset  . Diabetes Mother   . Heart disease Mother   . Kidney disease Mother   . Thyroid disease Mother   . Heart disease Father   . Seizures Father   . COPD Father   . Cancer Maternal Grandmother     Social History Social History  Substance Use Topics  . Smoking status: Never Smoker   . Smokeless tobacco: Never Used  . Alcohol Use: No    Current Outpatient Prescriptions  Medication Sig Dispense Refill  . ALPRAZolam (XANAX XR) 1 MG 24 hr tablet Take 1 tablet (1 mg total) by mouth daily. 30 tablet 1  . Calcium-Magnesium-Vitamin D (CALCIUM 1200+D3 PO) Take 1 tablet by mouth daily. Contains 800iu Vitamin D3    . cetirizine (ZYRTEC) 10 MG tablet Take 1 tablet (10 mg total) by mouth at bedtime. (Patient taking differently: Take  10 mg by mouth at bedtime as needed for allergies. ) 90 tablet 3  . cholecalciferol (VITAMIN D) 1000 UNITS tablet Take 1 tablet (1,000 Units total) by mouth daily at 12 noon. 30 tablet 0  . DULoxetine (CYMBALTA) 60 MG capsule Take 1 capsule (60 mg total) by mouth daily. 90 capsule 3  . esomeprazole (NEXIUM) 40 MG capsule TAKE 1 CAPSULE (40 MG TOTAL) BY MOUTH DAILY. 90 capsule 0  . gabapentin (NEURONTIN) 300 MG capsule Take 2 capsules (600 mg total) by mouth 3 (three) times daily. 540 capsule 3  . levothyroxine (SYNTHROID, LEVOTHROID) 50 MCG tablet TAKE 1 TABLET EVERY DAY 90 tablet 0  . lidocaine (LIDODERM) 5 % PLACE 1 PATCH ONTO THE SKIN DAILY. REMOVE & DISCARD PATCH WITHIN 12 HOURS OR AS DIRECTED BY MD  11  . methocarbamol (ROBAXIN) 750 MG tablet Take 1 tablet (750 mg total) by mouth 4 (four) times daily. 60 tablet 0  . rizatriptan (MAXALT) 10 MG tablet Take 0.5 tablets (5 mg total) by mouth as needed for migraine. May repeat in 2 hours if needed 10 tablet 5  . topiramate (TOPAMAX) 100 MG tablet TAKE 1/2 TABLET BY MOUTH IN THE MORNING AND 1 TAB AT BEDTIME (Patient taking differently: Take 50-100 mg by mouth 2 (  two) times daily. TAKE 1/2 TABLET BY MOUTH IN THE MORNING AND 1 TAB AT BEDTIME) 135 tablet 1   No current facility-administered medications for this visit.    Allergies  Allergen Reactions  . Tramadol Nausea And Vomiting  . Trazodone And Nefazodone Other (See Comments)    Per pt trazodone caused hallucinations and behavior changes     Review of Systems         Review of Systems :  [ y ] = yes, [  ] = no        General :  Weight gain [   ]    Weight loss  [   ]  Fatigue [  ]  Fever [  ]  Chills  [  ]                                Weakness  [  ]           HEENT    Headache [  ]  Dizziness [  ]  Blurred vision [  ] Glaucoma  [  ]                          Nosebleeds [  ] Painful or loose teeth [  ]        Cardiac :  Chest pain/ pressure [  ]  Resting SOB [  ] exertional SOB [   ]                        Orthopnea [  ]  Pedal edema  [  ]  Palpitations [  ] Syncope/presyncope [ ]                         Paroxysmal nocturnal dyspnea [  ]         Pulmonary : cough [  ]  wheezing [  ]  Hemoptysis [  ] Sputum [  ] Snoring [  ]                              Pneumothorax [  ]  Sleep apnea [  ]                               Chest wall pain from left-sided rib fractures has improved       GI : Vomiting [  ]  Dysphagia [  ]  Melena  [  ]  Abdominal pain [  ] BRBPR [  ]              Heart burn [  ]  Constipation [  ] Diarrhea  [  ] Colonoscopy [   ]        GU : Hematuria [  ]  Dysuria [  ]  Nocturia [  ] UTI's [  ]        Vascular : Claudication [  ]  Rest pain [  ]  DVT [  ] Vein stripping [  ] leg ulcers [  ]                          TIA [  ]  Stroke [  ]  Varicose veins [  ]        NEURO :  Headaches  Mahler.Beck  ] Seizures [  ] Vision changes [  ] Paresthesias [  ]                                       Seizures [  ]        Musculoskeletal :  Arthritis [  ] Gout  [  ]  Back pain [ chronic low back pain ]  Joint pain [  ]        Skin :  Rash [  ]  Melanoma [  ] Sores [  ]        Heme : Bleeding problems [  ]Clotting Disorders [  ] Anemia [  ]Blood Transfusion [ ]         Endocrine : Diabetes [  ] Heat or Cold intolerance [  ] Polyuria [  ]excessive thirst [ ]         Psych : Depression [  ]  Anxiety Mahler.Beck  ]  Psych hospitalizations [  ] Memory change [  ]                                               BP 124/80 mmHg  Pulse 96  Resp 20  Ht 5\' 3"  (1.6 m)  Wt 200 lb (90.719 kg)  BMI 35.44 kg/m2  SpO2 98% Physical Exam       Physical Exam  General: pleasant middle-aged obese female no acute distress HEENT: Normocephalic pupils equal , dentition adequate Neck: Supple without JVD, adenopathy, or bruit Chest: Clear to auscultation, symmetrical breath sounds, no rhonchi, 2+ tenderness over left anterior chest and sternum       No instability of chest wall or  deformity Cardiovascular: Regular rate and rhythm, no murmur, no gallop, peripheral pulses             palpable in all extremities Abdomen:  Soft, nontender, no palpable mass or organomegaly Extremities: Warm, well-perfused, no clubbing cyanosis edema or tenderness,              no venous stasis changes of the legs. Better range of motion of her upper extremities Rectal/GU: Deferred Neuro: Grossly non--focal and symmetrical throughout Skin: Clean and dry without rash or ulceration    Diagnostic Tests: Chest x-ray personally reviewed and counseled with  the patient showing improvement in her left rib fractures and sternal fracture  Impression: Improving condition for left-sided rib fractures and sternal fracture. She was told she could drive Work around the house and outside. Encouraged to take deep breaths every hour  Plan: Return for final visit with x-ray in 6 weeks  Mikey Bussing, MD [facility] [phone]

## 2015-10-23 ENCOUNTER — Encounter: Payer: Self-pay | Admitting: Family Medicine

## 2015-10-23 ENCOUNTER — Ambulatory Visit (INDEPENDENT_AMBULATORY_CARE_PROVIDER_SITE_OTHER): Payer: Commercial Managed Care - HMO | Admitting: Family Medicine

## 2015-10-23 VITALS — BP 127/84 | HR 99 | Temp 98.1°F | Resp 18 | Ht 63.0 in | Wt 207.6 lb

## 2015-10-23 DIAGNOSIS — R1310 Dysphagia, unspecified: Secondary | ICD-10-CM | POA: Diagnosis not present

## 2015-10-23 DIAGNOSIS — K219 Gastro-esophageal reflux disease without esophagitis: Secondary | ICD-10-CM | POA: Diagnosis not present

## 2015-10-23 MED ORDER — SUCRALFATE 1 G PO TABS: 1.0000 g | ORAL_TABLET | Freq: Three times a day (TID) | ORAL | Status: DC

## 2015-10-23 NOTE — Progress Notes (Signed)
Subjective:    Patient ID: Jennifer Davidson, female    DOB: 05-29-1955, 60 y.o.   MRN: [id number] Chief Complaint  Patient presents with  . follow up rib/sternum fx  . depression screening 11    HPI  Did have GI illness with vomiting and so had to cancel last therapy appt. Does have some early satiety and vomiting.  Feels like food is getting stuck in her chest.  No n ight sweats or hot flashes prior as she is takin Cytogeneticist med for this. No unitentional weight loss. Having breakthrough heartburn or indigestion freq with the nexium - has some tums but don't really help.  Has been able to see granddaughter, 38 yo, no syncope.  Pain is improving.  She was tolde to wear tight shirts around her ribs - really helps substatially by the phone.  Has the occ gasps of pain but she improving.  Told that over the next 6 wks. Back doing relatively well - wearing patches and taking gabapentin.  Did have appt with [facility] who told her keep an eye on her dose of gabapentin,.  Her husband poured morphine ER down the drain before she every tried them.  Past Medical History  Diagnosis Date  . Hypertension   . Cholesterol serum increased   . Hypothyroidism   . Sleep apnea   . Migraine   . Depression   . Neuromuscular disorder (HCC)   . GERD (gastroesophageal reflux disease)   . Anxiety   . Chronic back pain   . Chronic kidney disease   . Tachycardia   . Hyperparathyroidism (HCC)   . Diastolic heart failure [facility])    Past Surgical History  Procedure Laterality Date  . Abdominal hysterectomy    . Back surgery    . Appendectomy     Current Outpatient Prescriptions on File Prior to Visit  Medication Sig Dispense Refill  . ALPRAZolam (XANAX XR) 1 MG 24 hr tablet Take 1 tablet (1 mg total) by mouth daily. 30 tablet 1  . Calcium-Magnesium-Vitamin D (CALCIUM 1200+D3 PO) Take 1 tablet by mouth daily. Contains 800iu Vitamin D3    . cetirizine (ZYRTEC) 10 MG tablet Take 1 tablet (10 mg total) by mouth at  bedtime. (Patient taking differently: Take 10 mg by mouth at bedtime as needed for allergies. ) 90 tablet 3  . cholecalciferol (VITAMIN D) 1000 UNITS tablet Take 1 tablet (1,000 Units total) by mouth daily at 12 noon. 30 tablet 0  . DULoxetine (CYMBALTA) 60 MG capsule Take 1 capsule (60 mg total) by mouth daily. 90 capsule 3  . esomeprazole (NEXIUM) 40 MG capsule TAKE 1 CAPSULE (40 MG TOTAL) BY MOUTH DAILY. 90 capsule 0  . gabapentin (NEURONTIN) 300 MG capsule Take 2 capsules (600 mg total) by mouth 3 (three) times daily. 540 capsule 3  . levothyroxine (SYNTHROID, LEVOTHROID) 50 MCG tablet TAKE 1 TABLET EVERY DAY 90 tablet 0  . lidocaine (LIDODERM) 5 % PLACE 1 PATCH ONTO THE SKIN DAILY. REMOVE & DISCARD PATCH WITHIN 12 HOURS OR AS DIRECTED BY MD  11  . methocarbamol (ROBAXIN) 750 MG tablet Take 1 tablet (750 mg total) by mouth 4 (four) times daily. 60 tablet 0  . rizatriptan (MAXALT) 10 MG tablet Take 0.5 tablets (5 mg total) by mouth as needed for migraine. May repeat in 2 hours if needed 10 tablet 5   No current facility-administered medications on file prior to visit.   Allergies  Allergen Reactions  . Tramadol  Nausea And Vomiting  . Trazodone And Nefazodone Other (See Comments)    Per pt trazodone caused hallucinations and behavior changes    Family History  Problem Relation Age of Onset  . Diabetes Mother   . Heart disease Mother   . Kidney disease Mother   . Thyroid disease Mother   . Heart disease Father   . Seizures Father   . COPD Father   . Cancer Maternal Grandmother    Social History   Social History  . Marital Status: Married    Spouse Name: N/A  . Number of Children: N/A  . Years of Education: N/A   Social History Main Topics  . Smoking status: Never Smoker   . Smokeless tobacco: Never Used  . Alcohol Use: No  . Drug Use: No  . Sexual Activity:    Partners: Male     Comment: married   Other Topics Concern  . None   Social History Narrative    Review  of Systems See HPI    Objective:  .BP 127/84 mmHg  Pulse 99  Temp(Src) 98.1 F (36.7 C) (Oral)  Resp 18  Ht 5\' 3"  (1.6 m)  Wt 207 lb 9.6 oz (94.167 kg)  BMI 36.78 kg/m2  Physical Exam  Constitutional: She is oriented to person, place, and time. She appears well-developed and well-nourished. No distress.  HENT:  Head: Normocephalic and atraumatic.  Right Ear: External ear normal.  Left Ear: External ear normal.  Eyes: Conjunctivae are normal. No scleral icterus.  Neck: Normal range of motion. Neck supple. No thyromegaly present.  Cardiovascular: Normal rate, regular rhythm, normal heart sounds and intact distal pulses.   Pulmonary/Chest: Effort normal and breath sounds normal. No respiratory distress.  Musculoskeletal: She exhibits no edema.  Lymphadenopathy:    She has no cervical adenopathy.  Neurological: She is alert and oriented to person, place, and time.  Skin: Skin is warm and dry. She is not diaphoretic. No erythema.  Psychiatric: Her speech is normal and behavior is normal. Judgment normal. Cognition and memory are normal. She exhibits a depressed mood.          Assessment & Plan:   1. Gastroesophageal reflux disease, esophagitis presence not specified   2. Dysphagia    Refer to GI, cont ppi  Meds ordered this encounter  Medications  . sucralfate (CARAFATE) 1 g tablet    Sig: Take 1 tablet (1 g total) by mouth 4 (four) times daily -  with meals and at bedtime.    Dispense:  120 tablet    Refill:  1   Over 25 min spent in face-to-face evaluation of and consultation with patient and coordination of care.  Over 50% of this time was spent counseling this patient.   Norberto SorensonEva Lynora Dymond, M.D.  [facility]   [address] [place], [place] [address] [phone] phone [phone] fax  11/13/2015 11:05 PM

## 2015-11-03 ENCOUNTER — Other Ambulatory Visit: Payer: Self-pay

## 2015-11-03 NOTE — Telephone Encounter (Signed)
Dr Clelia CroftShaw, you recently saw pt but notes not finished yet. Do you want to RF?

## 2015-11-04 MED ORDER — TOPIRAMATE 100 MG PO TABS: ORAL_TABLET | ORAL | Status: DC

## 2015-11-04 MED ORDER — HYDROXYZINE HCL 50 MG PO TABS: 50.0000 mg | ORAL_TABLET | Freq: Four times a day (QID) | ORAL | Status: DC | PRN

## 2015-11-10 ENCOUNTER — Telehealth: Payer: Self-pay

## 2015-11-10 NOTE — Telephone Encounter (Signed)
Per fax, clarification needed Hydroxyzine available as HCL Salt (Atarax) or Pamoate Salt (Vistaril).   Sent to Dr. Clelia CroftShaw to clarify

## 2015-11-10 NOTE — Telephone Encounter (Addendum)
Whichever is cheapest on her insurance plan- it really doesn't make any clinical difference at all so I don't care.  It looks like the hydroxyzine HCL 50mg  is cheaper than the hydroxyzine pamoate 50mg  without insurance.   Also, she has been on this med for > 1 yr - I just increase the dose so likely whatever she has been on will be fine to continue but maybe the pharmacist knows what is cheaper [facility] or she can check her formulary or call the insurance to ask if she would like.

## 2015-11-11 NOTE — Telephone Encounter (Signed)
Done advised pharmacy by phone.

## 2015-11-12 ENCOUNTER — Ambulatory Visit (INDEPENDENT_AMBULATORY_CARE_PROVIDER_SITE_OTHER): Payer: Commercial Managed Care - HMO | Admitting: Family Medicine

## 2015-11-12 VITALS — BP 128/80 | HR 97 | Temp 98.4°F | Resp 17 | Ht 62.5 in | Wt 213.0 lb

## 2015-11-12 DIAGNOSIS — R300 Dysuria: Secondary | ICD-10-CM | POA: Diagnosis not present

## 2015-11-12 DIAGNOSIS — N898 Other specified noninflammatory disorders of vagina: Secondary | ICD-10-CM | POA: Diagnosis not present

## 2015-11-12 LAB — POCT URINALYSIS DIP (MANUAL ENTRY)
Bilirubin, UA: NEGATIVE
Glucose, UA: NEGATIVE
Ketones, POC UA: NEGATIVE
NITRITE UA: NEGATIVE
PH UA: 5.5
Spec Grav, UA: 1.01
UROBILINOGEN UA: 0.2

## 2015-11-12 LAB — POC MICROSCOPIC URINALYSIS (UMFC): Mucus: ABSENT

## 2015-11-12 LAB — POCT WET + KOH PREP
TRICH BY WET PREP: ABSENT
Yeast by KOH: ABSENT
Yeast by wet prep: ABSENT

## 2015-11-12 MED ORDER — HYDROXYZINE HCL 50 MG PO TABS: 50.0000 mg | ORAL_TABLET | Freq: Four times a day (QID) | ORAL | Status: DC | PRN

## 2015-11-12 MED ORDER — FLUCONAZOLE 150 MG PO TABS: 150.0000 mg | ORAL_TABLET | Freq: Once | ORAL | Status: DC

## 2015-11-12 MED ORDER — TERCONAZOLE 0.4 % VA CREA: 1.0000 | TOPICAL_CREAM | Freq: Every day | VAGINAL | Status: DC

## 2015-11-12 MED ORDER — TOPIRAMATE 100 MG PO TABS: ORAL_TABLET | ORAL | Status: DC

## 2015-11-12 NOTE — Patient Instructions (Addendum)
   IF you received an x-ray today, you will receive an invoice from [facility]. Please contact [facility] at [phone] with questions or concerns regarding your invoice.   IF you received labwork today, you will receive an invoice from [facility]. Please contact [facility] at [phone] with questions or concerns regarding your invoice.   Our billing staff will not be able to assist you with questions regarding bills from these companies.  You will be contacted with the lab results as soon as they are available. The fastest way to get your results is to activate your My Chart account. Instructions are located on the last page of this paperwork. If you have not heard from us regarding the results in 2 weeks, please contact this office.      Monilial Vaginitis Vaginitis in a soreness, swelling and redness (inflammation) of the vagina and vulva. Monilial vaginitis is not a sexually transmitted infection. CAUSES  Yeast vaginitis is caused by yeast (candida) that is normally found in your vagina. With a yeast infection, the candida has overgrown in number to a point that upsets the chemical balance. SYMPTOMS   White, thick vaginal discharge.  Swelling, itching, redness and irritation of the vagina and possibly the lips of the vagina (vulva).  Burning or painful urination.  Painful intercourse. DIAGNOSIS  Things that may contribute to monilial vaginitis are:  Postmenopausal and virginal states.  Pregnancy.  Infections.  Being tired, sick or stressed, especially if you had monilial vaginitis in the past.  Diabetes. Good control will help lower the chance.  Birth control pills.  Tight fitting garments.  Using bubble bath, feminine sprays, douches or deodorant tampons.  Taking certain medications that kill germs (antibiotics).  Sporadic recurrence can occur if you become ill. TREATMENT  Your caregiver will give you  medication.  There are several kinds of anti monilial vaginal creams and suppositories specific for monilial vaginitis. For recurrent yeast infections, use a suppository or cream in the vagina 2 times a week, or as directed.  Anti-monilial or steroid cream for the itching or irritation of the vulva may also be used. Get your caregiver's permission.  Painting the vagina with methylene blue solution may help if the monilial cream does not work.  Eating yogurt may help prevent monilial vaginitis. HOME CARE INSTRUCTIONS   Finish all medication as prescribed.  Do not have sex until treatment is completed or after your caregiver tells you it is okay.  Take warm sitz baths.  Do not douche.  Do not use tampons, especially scented ones.  Wear cotton underwear.  Avoid tight pants and panty hose.  Tell your sexual partner that you have a yeast infection. They should go to their caregiver if they have symptoms such as mild rash or itching.  Your sexual partner should be treated as well if your infection is difficult to eliminate.  Practice safer sex. Use condoms.  Some vaginal medications cause latex condoms to fail. Vaginal medications that harm condoms are:  Cleocin cream.  Butoconazole (Femstat).  Terconazole (Terazol) vaginal suppository.  Miconazole (Monistat) (may be purchased over the counter). SEEK MEDICAL CARE IF:   You have a temperature by mouth above 102 F (38.9 C).  The infection is getting worse after 2 days of treatment.  The infection is not getting better after 3 days of treatment.  You develop blisters in or around your vagina.  You develop vaginal bleeding, and it is not your menstrual period.  You have   pain when you urinate.  You develop intestinal problems.  You have pain with sexual intercourse.   This information is not intended to replace advice given to you by your health care provider. Make sure you discuss any questions you have with your  health care provider.   Document Released: 02/10/2005 Document Revised: 07/26/2011 Document Reviewed: 11/04/2014 Elsevier Interactive Patient Education 2016 [employer]  

## 2015-11-12 NOTE — Progress Notes (Signed)
Subjective:    Patient ID: Jennifer Davidson, female    DOB: 01/23/1956, 60 y.o.   MRN: [id number]  11/12/2015  Vaginitis   HPI This 60 y.o. female presents for evaluation of vaginal irritation.  Onset ten days ago.  Used three day Monistat without improvement one week ago.  Then went back and purchased seven day formula without improvement.  No fever/chills/sweats.  No abdominal pain.  +mild vaginal discharge brownish tan. No thick discharge; noticed underwear.  Always wears pad with Monistat.  +dysuria horrible; +frequency; no hematuria; +urgency; nocturia x 5; baseline nocturia x 2.  No urine sample yet.  No back pain/flank pain.  S/p hysterectomy.  Married; minimal sexual activity due to age and health issues.  No recent antibiotics; no change in detergents, soaps.  No tub baths.  No sodas or tea out of ordinary.  No urinary incontinence issues; no excessive sweating.  [facility]; had several rib fractures and sternal fracture.  Still recovering.  Review of Systems  Constitutional: Negative for fever, chills, diaphoresis and fatigue.  Eyes: Negative for visual disturbance.  Respiratory: Negative for cough and shortness of breath.   Cardiovascular: Negative for chest pain, palpitations and leg swelling.  Gastrointestinal: Negative for nausea, vomiting, abdominal pain, diarrhea and constipation.  Endocrine: Negative for cold intolerance, heat intolerance, polydipsia, polyphagia and polyuria.  Genitourinary: Positive for dysuria, urgency, frequency, vaginal discharge and vaginal pain. Negative for hematuria, flank pain, decreased urine volume, vaginal bleeding, difficulty urinating, genital sores, menstrual problem and pelvic pain.  Neurological: Negative for dizziness, tremors, seizures, syncope, facial asymmetry, speech difficulty, weakness, light-headedness, numbness and headaches.    Past Medical History  Diagnosis Date  . Hypertension   .  Cholesterol serum increased   . Hypothyroidism   . Sleep apnea   . Migraine   . Depression   . Neuromuscular disorder (HCC)   . GERD (gastroesophageal reflux disease)   . Anxiety   . Chronic back pain   . Chronic kidney disease   . Tachycardia   . Hyperparathyroidism (HCC)   . Diastolic heart failure [facility](HCC)    Past Surgical History  Procedure Laterality Date  . Abdominal hysterectomy    . Back surgery    . Appendectomy     Allergies  Allergen Reactions  . Tramadol Nausea And Vomiting  . Trazodone And Nefazodone Other (See Comments)    Per pt trazodone caused hallucinations and behavior changes     Social History   Social History  . Marital Status: Married    Spouse Name: N/A  . Number of Children: N/A  . Years of Education: N/A   Occupational History  . Not on file.   Social History Main Topics  . Smoking status: Never Smoker   . Smokeless tobacco: Never Used  . Alcohol Use: No  . Drug Use: No  . Sexual Activity:    Partners: Male     Comment: married   Other Topics Concern  . Not on file   Social History Narrative   Family History  Problem Relation Age of Onset  . Diabetes Mother   . Heart disease Mother   . Kidney disease Mother   . Thyroid disease Mother   . Heart disease Father   . Seizures Father   . COPD Father   . Cancer Maternal Grandmother        Objective:    BP 128/80 mmHg  Pulse 97  Temp(Src) 98.4 F (  36.9 C) (Oral)  Resp 17  Ht 5' 2.5" (1.588 m)  Wt 213 lb (96.616 kg)  BMI 38.31 kg/m2  SpO2 96% Physical Exam  Constitutional: She is oriented to person, place, and time. She appears well-developed and well-nourished. No distress.  HENT:  Head: Normocephalic and atraumatic.  Eyes: Conjunctivae and EOM are normal. Pupils are equal, round, and reactive to light.  Neck: Normal range of motion. Neck supple. Carotid bruit is not present. No thyromegaly present.  Cardiovascular: Normal rate, regular rhythm, normal heart sounds and  intact distal pulses.  Exam reveals no gallop and no friction rub.   No murmur heard. Pulmonary/Chest: Effort normal and breath sounds normal. She has no wheezes. She has no rales.  Abdominal: Soft. Bowel sounds are normal. She exhibits no distension and no mass. There is no tenderness. There is no rebound and no guarding.  Genitourinary: No labial fusion. There is no rash, tenderness or lesion on the right labia. There is erythema in the vagina. No tenderness or bleeding in the vagina. No foreign body around the vagina. No signs of injury around the vagina. Vaginal discharge found.  Lymphadenopathy:    She has no cervical adenopathy.  Neurological: She is alert and oriented to person, place, and time. No cranial nerve deficit.  Skin: Skin is warm and dry. No rash noted. She is not diaphoretic. No erythema. No pallor.  Psychiatric: She has a normal mood and affect. Her behavior is normal.   Results for orders placed or performed in visit on 11/12/15  POCT urinalysis dipstick  Result Value Ref Range   Color, UA yellow yellow   Clarity, UA cloudy (A) clear   Glucose, UA negative negative   Bilirubin, UA negative negative   Ketones, POC UA negative negative   Spec Grav, UA 1.010    Blood, UA small (A) negative   pH, UA 5.5    Protein Ur, POC =30 (A) negative   Urobilinogen, UA 0.2    Nitrite, UA Negative Negative   Leukocytes, UA large (3+) (A) Negative  POCT Microscopic Urinalysis ([facility])  Result Value Ref Range   WBC,UR,HPF,POC Too numerous to count  (A) None WBC/hpf   RBC,UR,HPF,POC Moderate (A) None RBC/hpf   Bacteria None None, Too numerous to count   Mucus Absent Absent   Epithelial Cells, UR Per Microscopy Few (A) None, Too numerous to count cells/hpf  POCT Wet + KOH Prep  Result Value Ref Range   Yeast by KOH Absent Present, Absent   Yeast by wet prep Absent Present, Absent   WBC by wet prep Few None, Few, Too numerous to count   Clue Cells Wet Prep HPF POC None None, Too  numerous to count   Trich by wet prep Absent Present, Absent   Bacteria Wet Prep HPF POC None None, Few, Too numerous to count   Epithelial Cells By [employer] Pref ([facility]) Moderate (A) None, Few, Too numerous to count   RBC,UR,HPF,POC Few (A) None RBC/hpf       Assessment & Plan:   1. Dysuria   2. Vaginal irritation    -New. -etiology unclear due to recent OTC medication use. -will treat with Diflucan and Terazol.  If no improvement in one week, call office. -send urine culture due to urinary symptoms;   Orders Placed This Encounter  Procedures  . Urine culture    Order Specific Question:  Source    Answer:  urogenital; clean catch  . POCT urinalysis dipstick  . POCT  Microscopic Urinalysis ([facility])  . POCT Wet + KOH Prep   Meds ordered this encounter  Medications  . fluconazole (DIFLUCAN) 150 MG tablet    Sig: Take 1 tablet (150 mg total) by mouth once. Repeat if needed    Dispense:  2 tablet    Refill:  0  . terconazole (TERAZOL 7) 0.4 % vaginal cream    Sig: Place 1 applicator vaginally at bedtime.    Dispense:  45 g    Refill:  0    No Follow-up on file.   Ruby Dilone Paulita FujitaMartin Jarrin Staley, M.D. [facility]  [place] [address] [place], [place]  [id number] [phone] phone [phone] fax

## 2015-11-13 ENCOUNTER — Other Ambulatory Visit: Payer: Self-pay

## 2015-11-13 DIAGNOSIS — K219 Gastro-esophageal reflux disease without esophagitis: Secondary | ICD-10-CM

## 2015-11-13 HISTORY — DX: Gastro-esophageal reflux disease without esophagitis: K21.9

## 2015-11-13 NOTE — Telephone Encounter (Signed)
sent fax to clarify Hydroxyzine HCL  50mg  tabs; Sig: 1 tab PO every 6 hrs

## 2015-11-14 LAB — URINE CULTURE

## 2015-11-23 ENCOUNTER — Other Ambulatory Visit: Payer: Self-pay | Admitting: Family Medicine

## 2015-11-23 MED ORDER — CIPROFLOXACIN HCL 500 MG PO TABS: 500.0000 mg | ORAL_TABLET | Freq: Two times a day (BID) | ORAL | Status: DC

## 2015-11-23 NOTE — Addendum Note (Signed)
Addended by: Ethelda ChickSMITH, KRISTI M on: 11/23/2015 10:29 PM   Modules accepted: Orders

## 2015-11-24 ENCOUNTER — Other Ambulatory Visit: Payer: Self-pay | Admitting: Cardiothoracic Surgery

## 2015-11-24 ENCOUNTER — Other Ambulatory Visit: Payer: Self-pay | Admitting: Family Medicine

## 2015-11-24 DIAGNOSIS — I5030 Unspecified diastolic (congestive) heart failure: Secondary | ICD-10-CM

## 2015-11-24 NOTE — Telephone Encounter (Signed)
Dr Clelia CroftShaw, do you want to give any RFs on lomotil you Rxd in Jan for pt's reported IBS/ anxiety type of diarrhea (according to OV notes)?

## 2015-11-25 NOTE — Telephone Encounter (Signed)
It is a controlled substance and I am not in the office so please call it in on my behalf. Thank you.

## 2015-11-25 NOTE — Telephone Encounter (Signed)
Called in.

## 2015-11-25 NOTE — Telephone Encounter (Signed)
Yes, fine to refill, thanks.

## 2015-11-26 ENCOUNTER — Ambulatory Visit (INDEPENDENT_AMBULATORY_CARE_PROVIDER_SITE_OTHER): Payer: Commercial Managed Care - HMO | Admitting: Cardiothoracic Surgery

## 2015-11-26 ENCOUNTER — Encounter: Payer: Self-pay | Admitting: Cardiothoracic Surgery

## 2015-11-26 ENCOUNTER — Ambulatory Visit
Admission: RE | Admit: 2015-11-26 | Discharge: 2015-11-26 | Disposition: A | Discharge status: Home / Self Care | Payer: Commercial Managed Care - HMO | Source: Ambulatory Visit | Attending: Cardiothoracic Surgery | Admitting: Cardiothoracic Surgery

## 2015-11-26 VITALS — BP 114/73 | HR 92 | Resp 20 | Ht 62.5 in | Wt 213.0 lb

## 2015-11-26 DIAGNOSIS — S2232XD Fracture of one rib, left side, subsequent encounter for fracture with routine healing: Secondary | ICD-10-CM

## 2015-11-26 DIAGNOSIS — S2220XD Unspecified fracture of sternum, subsequent encounter for fracture with routine healing: Secondary | ICD-10-CM | POA: Diagnosis not present

## 2015-11-26 DIAGNOSIS — R079 Chest pain, unspecified: Secondary | ICD-10-CM | POA: Diagnosis not present

## 2015-11-26 DIAGNOSIS — R0602 Shortness of breath: Secondary | ICD-10-CM | POA: Diagnosis not present

## 2015-11-26 DIAGNOSIS — I5030 Unspecified diastolic (congestive) heart failure: Secondary | ICD-10-CM

## 2015-11-26 NOTE — Progress Notes (Signed)
PCP is Norberto Sorenson, MD Referring Provider is [name], MD  Chief Complaint  Patient presents with  . Rib Fracture    6 week f/u with CXR    [id number] follow-up for a nondisplaced sternal and left upper rib fractures status sustained 4 months ago. Symptoms are much more improved. She only has mild sternal discomfort when she rolls from one side to the other in bed. She is able to use her upper extremities without discomfort. She denies difficulty breathing productive cough or fever. Chest x-ray today's clear without evidence of skeletal bone disease, fractures appeared to be healed.   Past Medical History  Diagnosis Date  . Hypertension   . Cholesterol serum increased   . Hypothyroidism   . Sleep apnea   . Migraine   . Depression   . Neuromuscular disorder (HCC)   . GERD (gastroesophageal reflux disease)   . Anxiety   . Chronic back pain   . Chronic kidney disease   . Tachycardia   . Hyperparathyroidism (HCC)   . Diastolic heart failure [facility])     Past Surgical History  Procedure Laterality Date  . Abdominal hysterectomy    . Back surgery    . Appendectomy      Family History  Problem Relation Age of Onset  . Diabetes Mother   . Heart disease Mother   . Kidney disease Mother   . Thyroid disease Mother   . Heart disease Father   . Seizures Father   . COPD Father   . Cancer Maternal Grandmother     Social History Social History  Substance Use Topics  . Smoking status: Never Smoker   . Smokeless tobacco: Never Used  . Alcohol Use: No    Current Outpatient Prescriptions  Medication Sig Dispense Refill  . ALPRAZolam (XANAX XR) 1 MG 24 hr tablet Take 1 tablet (1 mg total) by mouth daily. 30 tablet 1  . Calcium-Magnesium-Vitamin D (CALCIUM 1200+D3 PO) Take 1 tablet by mouth daily. Contains 800iu Vitamin D3    . cholecalciferol (VITAMIN D) 1000 UNITS tablet Take 1 tablet (1,000 Units total) by mouth daily at 12 noon. 30 tablet 0  . diphenoxylate-atropine (LOMOTIL)  2.5-0.025 MG tablet TAKE 1 TABLET BY MOUTH 4 TIMES A DAY AS NEEDED FOR DIARRHEA 30 tablet 3  . DULoxetine (CYMBALTA) 60 MG capsule Take 1 capsule (60 mg total) by mouth daily. 90 capsule 3  . esomeprazole (NEXIUM) 40 MG capsule TAKE 1 CAPSULE (40 MG TOTAL) BY MOUTH DAILY. 90 capsule 0  . fluconazole (DIFLUCAN) 150 MG tablet Take 1 tablet (150 mg total) by mouth once. Repeat if needed 2 tablet 0  . gabapentin (NEURONTIN) 300 MG capsule Take 2 capsules (600 mg total) by mouth 3 (three) times daily. 540 capsule 3  . hydrOXYzine (ATARAX/VISTARIL) 50 MG tablet Take 1 tablet (50 mg total) by mouth every 6 (six) hours as needed. 360 tablet 1  . levothyroxine (SYNTHROID, LEVOTHROID) 50 MCG tablet TAKE 1 TABLET EVERY DAY 90 tablet 0  . lidocaine (LIDODERM) 5 % PLACE 1 PATCH ONTO THE SKIN DAILY. REMOVE & DISCARD PATCH WITHIN 12 HOURS OR AS DIRECTED BY MD  11  . methocarbamol (ROBAXIN) 750 MG tablet Take 1 tablet (750 mg total) by mouth 4 (four) times daily. 60 tablet 0  . rizatriptan (MAXALT) 10 MG tablet Take 0.5 tablets (5 mg total) by mouth as needed for migraine. May repeat in 2 hours if needed 10 tablet 5  . sucralfate (CARAFATE) 1  g tablet Take 1 tablet (1 g total) by mouth 4 (four) times daily -  with meals and at bedtime. 120 tablet 1  . topiramate (TOPAMAX) 100 MG tablet TAKE 1/2 TABLET BY MOUTH IN THE MORNING AND 1 TAB AT BEDTIME 135 tablet 1   No current facility-administered medications for this visit.    Allergies  Allergen Reactions  . Tramadol Nausea And Vomiting  . Trazodone And Nefazodone Other (See Comments)    Per pt trazodone caused hallucinations and behavior changes     Review of Systems   No more syncopal episodes  BP 114/73 mmHg  Pulse 92  Resp 20  Ht 5' 2.5" (1.588 m)  Wt 213 lb (96.616 kg)  BMI 38.31 kg/m2  SpO2 96% Physical Exam Alert and comfortable Breath sounds clear Sternum with mild upper tenderness but no instability or click Heart rhythm regular without  murmur or gallop Neuro intact, good range of motion of each upper extremity  Diagnostic Tests: Chest x-ray without evidence of fracture  Impression: Healed sternal and left rib fractures  Plan: No further follow-up needed   Mikey BussingPeter Van Trigt III, MD [facility] [phone]

## 2015-11-27 ENCOUNTER — Telehealth: Payer: Self-pay

## 2015-11-27 NOTE — Telephone Encounter (Signed)
Spoke with pt.  She received the MyChart note today and will pick up the meds. Reinforced to take with food and be sure to take all the abx and reasons why. She states she still feels very sluggish! Advised to call with any questions.

## 2015-11-27 NOTE — Telephone Encounter (Signed)
Dr Clelia CroftShaw, you recently discussed thyroid, and tsh stable so pended levo RF, but I don't see IBS discussed recently. OK to give RFs? Please send quantity you want pt to have for 90 days. Thank you!

## 2015-11-27 NOTE — Telephone Encounter (Signed)
-----   Message from Ethelda ChickKristi M Smith, MD sent at 11/23/2015 10:31 PM EDT ----- See previous result note; call patient with results and confirm that she has received MyChart message.

## 2015-12-02 ENCOUNTER — Other Ambulatory Visit: Payer: Self-pay | Admitting: Family Medicine

## 2015-12-08 ENCOUNTER — Other Ambulatory Visit: Payer: Self-pay | Admitting: Family Medicine

## 2015-12-10 NOTE — Telephone Encounter (Signed)
Called in because no documentation that it had already been called or faxed in.

## 2015-12-12 ENCOUNTER — Other Ambulatory Visit: Payer: Self-pay | Admitting: Family Medicine

## 2015-12-15 NOTE — Telephone Encounter (Signed)
Dr [name], pt has seen you several times recently, but don't see Furosemide and K+ discussed recently. Do you want to RF? Also, you Rxd alprazolam locally on 7/19 #30 w/ 1 RF. Do you want to send in this mail order Rx and have me cancel RF on local Rx, or deny this mail order?

## 2015-12-17 NOTE — Telephone Encounter (Signed)
Patient states she is needing a refill of Xanax 1 mg sent to [facility].     She states she has picked the xanax 1 mg 24 hour tablet from [facility] already.

## 2015-12-17 NOTE — Telephone Encounter (Signed)
Pt says she needs a refill on her xanax and the pharmacy has been trying to get this refilled since last Friday.

## 2015-12-23 NOTE — Telephone Encounter (Signed)
Called in all refills

## 2015-12-23 NOTE — Telephone Encounter (Signed)
LMOM for pt that Dr Clelia CroftShaw called in all of her RFs to [facility] mail order.

## 2015-12-25 NOTE — Telephone Encounter (Signed)
Called in meds to [facility]

## 2015-12-28 ENCOUNTER — Other Ambulatory Visit: Payer: Self-pay | Admitting: Family Medicine

## 2015-12-30 ENCOUNTER — Other Ambulatory Visit: Payer: Self-pay | Admitting: Family Medicine

## 2016-01-02 ENCOUNTER — Telehealth: Payer: Self-pay

## 2016-01-02 NOTE — Telephone Encounter (Signed)
Pt advised.

## 2016-01-02 NOTE — Telephone Encounter (Signed)
PATIENT WOULD LIKE DR. SHAW TO REFER HER TO [facility] FOR HER (R) KNEE. SHE TRIED TO MAKE THE APPOINTMENT HER SELF, BUT THEY TOLD HER SHE WOULD HAVE TO GET HER PCP TO MAKE IT FOR HER. SHE WOULD LIKE TO SEE DR. Luiz BlareGRAVES. BEST PHONE [phone] (CELL) MBC

## 2016-01-02 NOTE — Telephone Encounter (Signed)
She needs an office visit to discuss the problem first.

## 2016-01-09 ENCOUNTER — Other Ambulatory Visit: Payer: Self-pay | Admitting: Family Medicine

## 2016-01-15 ENCOUNTER — Other Ambulatory Visit: Payer: Self-pay | Admitting: Family Medicine

## 2016-01-29 ENCOUNTER — Ambulatory Visit (INDEPENDENT_AMBULATORY_CARE_PROVIDER_SITE_OTHER): Payer: Commercial Managed Care - HMO | Admitting: Family Medicine

## 2016-01-29 ENCOUNTER — Encounter: Payer: Self-pay | Admitting: Family Medicine

## 2016-01-29 VITALS — BP 114/78 | HR 100 | Temp 97.5°F | Resp 18 | Ht 62.5 in | Wt 213.0 lb

## 2016-01-29 DIAGNOSIS — N183 Chronic kidney disease, stage 3 unspecified: Secondary | ICD-10-CM

## 2016-01-29 DIAGNOSIS — F419 Anxiety disorder, unspecified: Secondary | ICD-10-CM | POA: Diagnosis not present

## 2016-01-29 DIAGNOSIS — M549 Dorsalgia, unspecified: Secondary | ICD-10-CM

## 2016-01-29 DIAGNOSIS — K219 Gastro-esophageal reflux disease without esophagitis: Secondary | ICD-10-CM | POA: Diagnosis not present

## 2016-01-29 DIAGNOSIS — Z79899 Other long term (current) drug therapy: Secondary | ICD-10-CM | POA: Diagnosis not present

## 2016-01-29 DIAGNOSIS — R7303 Prediabetes: Secondary | ICD-10-CM

## 2016-01-29 DIAGNOSIS — F39 Unspecified mood [affective] disorder: Secondary | ICD-10-CM

## 2016-01-29 DIAGNOSIS — I10 Essential (primary) hypertension: Secondary | ICD-10-CM | POA: Diagnosis not present

## 2016-01-29 DIAGNOSIS — F329 Major depressive disorder, single episode, unspecified: Secondary | ICD-10-CM

## 2016-01-29 DIAGNOSIS — E039 Hypothyroidism, unspecified: Secondary | ICD-10-CM | POA: Diagnosis not present

## 2016-01-29 DIAGNOSIS — M25561 Pain in right knee: Secondary | ICD-10-CM

## 2016-01-29 DIAGNOSIS — E785 Hyperlipidemia, unspecified: Secondary | ICD-10-CM

## 2016-01-29 DIAGNOSIS — G8929 Other chronic pain: Secondary | ICD-10-CM

## 2016-01-29 LAB — POCT URINALYSIS DIP (MANUAL ENTRY)
BILIRUBIN UA: NEGATIVE
Glucose, UA: NEGATIVE
Ketones, POC UA: NEGATIVE
NITRITE UA: NEGATIVE
PROTEIN UA: NEGATIVE
RBC UA: NEGATIVE
SPEC GRAV UA: 1.01
UROBILINOGEN UA: 0.2
pH, UA: 5

## 2016-01-29 LAB — CBC
HEMATOCRIT: 39.4 % (ref 35.0–45.0)
Hemoglobin: 13.2 g/dL (ref 11.7–15.5)
MCH: 26.7 pg — AB (ref 27.0–33.0)
MCHC: 33.5 g/dL (ref 32.0–36.0)
MCV: 79.8 fL — ABNORMAL LOW (ref 80.0–100.0)
MPV: 8.4 fL (ref 7.5–12.5)
Platelets: 242 10*3/uL (ref 140–400)
RBC: 4.94 MIL/uL (ref 3.80–5.10)
RDW: 14.6 % (ref 11.0–15.0)
WBC: 7.3 10*3/uL (ref 3.8–10.8)

## 2016-01-29 LAB — COMPREHENSIVE METABOLIC PANEL
ALBUMIN: 4.5 g/dL (ref 3.6–5.1)
ALK PHOS: 70 U/L (ref 33–130)
ALT: 26 U/L (ref 6–29)
AST: 27 U/L (ref 10–35)
BILIRUBIN TOTAL: 0.4 mg/dL (ref 0.2–1.2)
BUN: 16 mg/dL (ref 7–25)
CALCIUM: 9.7 mg/dL (ref 8.6–10.4)
CO2: 26 mmol/L (ref 20–31)
Chloride: 103 mmol/L (ref 98–110)
Creat: 1.61 mg/dL — ABNORMAL HIGH (ref 0.50–0.99)
Glucose, Bld: 112 mg/dL — ABNORMAL HIGH (ref 65–99)
Potassium: 4.2 mmol/L (ref 3.5–5.3)
Sodium: 138 mmol/L (ref 135–146)
TOTAL PROTEIN: 7.4 g/dL (ref 6.1–8.1)

## 2016-01-29 LAB — LIPID PANEL
CHOL/HDL RATIO: 5.5 ratio — AB (ref <=5.0)
Cholesterol: 312 mg/dL — ABNORMAL HIGH (ref 125–200)
HDL: 57 mg/dL (ref >=46)
LDL CALC: 192 mg/dL — AB (ref <130)
Triglycerides: 316 mg/dL — ABNORMAL HIGH (ref <150)
VLDL: 63 mg/dL — ABNORMAL HIGH (ref <30)

## 2016-01-29 LAB — TSH: TSH: 1.27 mIU/L

## 2016-01-29 LAB — HEMOGLOBIN A1C
Hgb A1c MFr Bld: 5.8 % — ABNORMAL HIGH (ref <5.7)
MEAN PLASMA GLUCOSE: 120 mg/dL

## 2016-01-29 MED ORDER — ESOMEPRAZOLE MAGNESIUM 40 MG PO CPDR: DELAYED_RELEASE_CAPSULE | ORAL | 3 refills | Status: DC

## 2016-01-29 MED ORDER — ALPRAZOLAM 1 MG PO TABS: ORAL_TABLET | ORAL | 1 refills | Status: DC

## 2016-01-29 MED ORDER — GABAPENTIN 300 MG PO CAPS: 600.0000 mg | ORAL_CAPSULE | Freq: Three times a day (TID) | ORAL | 3 refills | Status: DC

## 2016-01-29 MED ORDER — SUCRALFATE 1 G PO TABS: 1.0000 g | ORAL_TABLET | Freq: Three times a day (TID) | ORAL | 1 refills | Status: DC

## 2016-01-29 MED ORDER — METHOCARBAMOL 750 MG PO TABS
750.0000 mg | ORAL_TABLET | Freq: Four times a day (QID) | ORAL | 1 refills | Status: DC | PRN
Start: 2016-01-29 — End: 2016-06-07

## 2016-01-29 MED ORDER — TOPIRAMATE 100 MG PO TABS: ORAL_TABLET | ORAL | 3 refills | Status: DC

## 2016-01-29 MED ORDER — RIZATRIPTAN BENZOATE 10 MG PO TABS: 5.0000 mg | ORAL_TABLET | ORAL | 5 refills | Status: DC | PRN

## 2016-01-29 MED ORDER — ALPRAZOLAM ER 1 MG PO TB24: 1.0000 mg | ORAL_TABLET | Freq: Every day | ORAL | 5 refills | Status: DC

## 2016-01-29 NOTE — Patient Instructions (Signed)
     IF you received an x-ray today, you will receive an invoice from [facility]. Please contact [facility] at [phone] with questions or concerns regarding your invoice.   IF you received labwork today, you will receive an invoice from [facility]. Please contact [facility] at [phone] with questions or concerns regarding your invoice.   Our billing staff will not be able to assist you with questions regarding bills from these companies.  You will be contacted with the lab results as soon as they are available. The fastest way to get your results is to activate your My Chart account. Instructions are located on the last page of this paperwork. If you have not heard from us regarding the results in 2 weeks, please contact this office.      

## 2016-02-10 ENCOUNTER — Other Ambulatory Visit: Payer: Self-pay | Admitting: Family Medicine

## 2016-02-16 NOTE — Progress Notes (Signed)
Subjective:    Patient ID: Jennifer Davidson, female    DOB: 1955/08/03, 60 y.o.   MRN: [id number] Chief Complaint  Patient presents with  . Follow-up    3 month follow up    HPI Jennifer Davidson is a 60 y.o. female who presents to the [facility] for 3 month follow up reevaluation of chronic medical conditions.   Mood Disorder with depression and anxiety: Seen Dr Noe Gens; PhD psychologist. On cymbalta 60 in addition to xanax ER 1mg  qd and xanax IR 1 mg bid prn - she always uses 1 tab of the IR at night and uses the second tab of the IR just for flaires during stress.  Also using hydroxyzine 50mg  qid prn.  Chronic kidney disease stage 3: Watch gabapentin dose. Followed at [facility] every 6 mos  Healing sternum and rib fractures: Followed by Dr Donata Clay. Fractures appear to be healed on xray imaging. Pt still has pain on occasional movements. No need for follow up.   Chronic musculoskeletal pain: Mainly back pain originating from workers comp injury, followed by ortho and pain management. Wearing lidocaine patches and taking gabapentin 600mg  tid as well as methocarbamol 750mg  qid. Pt wants and needs to stay off narcotics. Has been having right knee pain and falls more often and would like to see Dr. Luiz Blare for this.  GERD with dysphagia: Pt takes PPI nexium and prn carafate. Pt referred to GI.  Prn qid bentyl with prn lomotil  Hypothyroid: On levothyroxine 50  OSA:  HTN: On lasix 40 qd with K 20. Both through [facility] - will need refill in Feb - rec be seen for repeat bmp about then  HA: on topamax 50mg  qam and 100mg  qhs with prn maxalt  Pre-DM: hgba1c 5.8 over a yr ago.  HPL: LDL 179, non-hdl 233 9 mos prior - work on tlc  Keeps bentyl on hand just as needed.  Ok to refill whenever we get request.  Now  Having more consitatpion  - freq small BM throughout  The day - add in  Still on cymbalta 60 - ok to refill when needed in 3 mos - will go back and see Dr. Noe Gens She has  passed out several times which she attributes to heat - she was out with [name] and she had a syncopal episode cming inside from the outside - fall to the kitchen floor, second time she was out near the hot tub she passed out across her bed. Last time was end of July - so about 7 weeks  Still so sore in her chest Mark wihty both knees by Dr. Luiz Blare GI called her but said they wouldn't see her until she had her prior records from [facility] in GI in [place].Not using the hydroxyzine very much any more - keeps it on hand - ok to refill when needed after Dec. Using lidocaine patches over-the-counter. Having more back pain from lifting while [name] has been recovering - wharing back brace, using asper creme, lidocaine 4%, ga bapentin - will lock up on her, uses methocarbamol as needed and def does help 0 if [facility] doesn't fill, [facility] Not having migraines frequently so just keeps maxalt on hand but not needed. carafate is taking before she eats wth good result - cant see GI- will try [facility] - [facility] topamax working well Take calcium 600mg  bid with D 1000uqd Start fiber  supp  Mark with 2 knee surg - every other day PT 2 hrs tid on machine, not sleeping  Depression screen [facility] 2/9 01/29/2016 11/12/2015 10/23/2015 09/18/2015 09/03/2015  Decreased Interest 0 0 2 0 0  Down, Depressed, Hopeless 0 0 2 0 0  PHQ - 2 Score 0 0 4 0 0  Altered sleeping - 0 2 - -  Tired, decreased energy - 0 2 - -  Change in appetite - 0 1 - -  Feeling bad or failure about yourself  - 0 1 - -  Trouble concentrating - 0 0 - -  Moving slowly or fidgety/restless - 0 1 - -  Suicidal thoughts - 0 0 - -  PHQ-9 Score - 0 11 - -  Difficult doing work/chores - - - - -   Past Medical History:  Diagnosis Date  . Anxiety   . Cholesterol serum increased   . Chronic back pain   . Chronic kidney disease   . Depression   . Diastolic heart failure (HCC)   . GERD (gastroesophageal reflux disease)     . Hyperparathyroidism (HCC)   . Hypertension   . Hypothyroidism   . Migraine   . Neuromuscular disorder (HCC)   . Sleep apnea   . Tachycardia    Past Surgical History:  Procedure Laterality Date  . ABDOMINAL HYSTERECTOMY    . APPENDECTOMY    . BACK SURGERY     Current Outpatient Prescriptions on File Prior to Visit  Medication Sig Dispense Refill  . Calcium-Magnesium-Vitamin D (CALCIUM 1200+D3 PO) Take 1 tablet by mouth daily. Contains 800iu Vitamin D3    . cholecalciferol (VITAMIN D) 1000 UNITS tablet Take 1 tablet (1,000 Units total) by mouth daily at 12 noon. 30 tablet 0  . dicyclomine (BENTYL) 20 MG tablet TAKE 1 TABLET FOUR TIMES A DAY BEFORE MEALS AND AT BEDTIME AS NEEDED FOR CRAMPING IRRITABLE BOWELS 180 tablet 0  . diphenoxylate-atropine (LOMOTIL) 2.5-0.025 MG tablet TAKE 1 TABLET BY MOUTH 4 TIMES A DAY AS NEEDED FOR DIARRHEA 30 tablet 3  . DULoxetine (CYMBALTA) 60 MG capsule Take 1 capsule (60 mg total) by mouth daily. 90 capsule 3  . furosemide (LASIX) 40 MG tablet TAKE 1 TABLET (40 MG TOTAL) BY MOUTH DAILY. 90 tablet 1  . levothyroxine (SYNTHROID, LEVOTHROID) 50 MCG tablet TAKE 1 TABLET EVERY DAY 90 tablet 1  . lidocaine (LIDODERM) 5 % PLACE 1 PATCH ONTO THE SKIN DAILY. REMOVE & DISCARD PATCH WITHIN 12 HOURS OR AS DIRECTED BY MD  11  . potassium chloride SA (K-DUR,KLOR-CON) 20 MEQ tablet TAKE 1 TABLET (20 MEQ TOTAL) BY MOUTH 2 (TWO) TIMES DAILY. 180 tablet 1   No current facility-administered medications on file prior to visit.    Allergies  Allergen Reactions  . Tramadol Nausea And Vomiting  . Trazodone And Nefazodone Other (See Comments)    Per pt trazodone caused hallucinations and behavior changes    Family History  Problem Relation Age of Onset  . Diabetes Mother   . Heart disease Mother   . Kidney disease Mother   . Thyroid disease Mother   . Heart disease Father   . Seizures Father   . COPD Father   . Cancer Maternal Grandmother    Social History    Social History  . Marital status: Married    Spouse name: N/A  . Number of children: N/A  . Years of education: N/A   Social History Main Topics  . Smoking status:  Never Smoker  . Smokeless tobacco: Never Used  . Alcohol use No  . Drug use: No  . Sexual activity: Yes    Partners: Male     Comment: married   Other Topics Concern  . None   Social History Narrative  . None     Review of Systems  Constitutional: Positive for activity change and fatigue. Negative for appetite change, chills and fever.  Gastrointestinal: Negative for abdominal pain, constipation, diarrhea, nausea and vomiting.  Genitourinary: Positive for frequency. Negative for decreased urine volume, difficulty urinating and urgency.  Musculoskeletal: Positive for arthralgias, back pain, gait problem and myalgias. Negative for joint swelling.  Neurological: Positive for dizziness, syncope, weakness, light-headedness and numbness.  Hematological: Negative for adenopathy. Does not bruise/bleed easily.  Psychiatric/Behavioral: Positive for sleep disturbance. Negative for agitation, confusion, dysphoric mood and self-injury. The patient is nervous/anxious. The patient is not hyperactive.        Objective:   Physical Exam  Constitutional: She is oriented to person, place, and time. She appears well-developed and well-nourished. No distress.  HENT:  Head: Normocephalic and atraumatic.  Right Ear: External ear normal.  Left Ear: External ear normal.  Eyes: Conjunctivae are normal. No scleral icterus.  Neck: Normal range of motion. Neck supple. No thyromegaly present.  Cardiovascular: Normal rate, regular rhythm, normal heart sounds and intact distal pulses.   Pulmonary/Chest: Effort normal and breath sounds normal. No respiratory distress.  Musculoskeletal: She exhibits no edema.  Lymphadenopathy:    She has no cervical adenopathy.  Neurological: She is alert and oriented to person, place, and time.  Skin:  Skin is warm and dry. She is not diaphoretic. No erythema.  Psychiatric: She is withdrawn. Cognition and memory are normal. She exhibits a depressed mood.          Assessment & Plan:   [facility] 1 mg qam [facility] mail order alprazoam 1mg  qhs   Doing lasix once a day - will need refill in Feb [facility]  Is fasting today.  1. Essential hypertension   2. Gastroesophageal reflux disease, esophagitis presence not specified   3. Hypothyroidism, unspecified hypothyroidism type   4. Chronic kidney disease, stage 3 (moderate)   5. Encounter for long-term (current) use of medications   6. Anxiety and depression   7. Hyperlipidemia   8. Chronic back pain   9. Mood disorder (HCC)   10. Prediabetes   11. Right knee pain     Orders Placed This Encounter  Procedures  . TSH  . Comprehensive metabolic panel    Order Specific Question:   Has the patient fasted?    Answer:   Yes  . CBC  . Hemoglobin A1c  . Lipid panel    Order Specific Question:   Has the patient fasted?    Answer:   Yes  . Ambulatory referral to Orthopedic Surgery    Referral Priority:   Routine    Referral Type:   Surgical    Referral Reason:   Specialty Services Required    Requested Specialty:   Orthopedic Surgery    Number of Visits Requested:   1  . POCT urinalysis dipstick    Meds ordered this encounter  Medications  . ALPRAZolam (XANAX XR) 1 MG 24 hr tablet    Sig: Take 1 tablet (1 mg total) by mouth daily.    Dispense:  30 tablet    Refill:  5    Not to exceed 4 additional fills before  03/16/2016  . ALPRAZolam (XANAX) 1 MG tablet    Sig: TAKE 1 TABLET TWICE DAILY AS NEEDED FOR ANXIETY    Dispense:  180 tablet    Refill:  1  . esomeprazole (NEXIUM) 40 MG capsule    Sig: TAKE 1 CAPSULE (40 MG TOTAL) BY MOUTH DAILY.    Dispense:  90 capsule    Refill:  3  . gabapentin (NEURONTIN) 300 MG capsule    Sig: Take 2 capsules (600 mg total) by mouth 3 (three) times daily.    Dispense:   540 capsule    Refill:  3  . methocarbamol (ROBAXIN) 750 MG tablet    Sig: Take 1 tablet (750 mg total) by mouth every 6 (six) hours as needed for muscle spasms.    Dispense:  360 tablet    Refill:  1  . sucralfate (CARAFATE) 1 g tablet    Sig: Take 1 tablet (1 g total) by mouth 4 (four) times daily -  with meals and at bedtime.    Dispense:  360 tablet    Refill:  1  . topiramate (TOPAMAX) 100 MG tablet    Sig: TAKE 1/2 TABLET BY MOUTH IN THE MORNING AND 1 TAB AT BEDTIME    Dispense:  135 tablet    Refill:  3  . rizatriptan (MAXALT) 10 MG tablet    Sig: Take 0.5 tablets (5 mg total) by mouth as needed for migraine. May repeat in 2 hours if needed    Dispense:  10 tablet    Refill:  5      Norberto Sorenson, M.D.  [facility] [address] [place], [place] [address] [phone] phone [phone] fax  02/16/16 7:09 AM  Results for orders placed or performed in visit on 01/29/16  TSH  Result Value Ref Range   TSH 1.27 mIU/L  Comprehensive metabolic panel  Result Value Ref Range   Sodium 138 135 - 146 mmol/L   Potassium 4.2 3.5 - 5.3 mmol/L   Chloride 103 98 - 110 mmol/L   CO2 26 20 - 31 mmol/L   Glucose, Bld 112 (H) 65 - 99 mg/dL   BUN 16 7 - 25 mg/dL   Creat 1.30 (H) 8.65 - 0.99 mg/dL   Total Bilirubin 0.4 0.2 - 1.2 mg/dL   Alkaline Phosphatase 70 33 - 130 U/L   AST 27 10 - 35 U/L   ALT 26 6 - 29 U/L   Total Protein 7.4 6.1 - 8.1 g/dL   Albumin 4.5 3.6 - 5.1 g/dL   Calcium 9.7 8.6 - 78.4 mg/dL  CBC  Result Value Ref Range   WBC 7.3 3.8 - 10.8 K/uL   RBC 4.94 3.80 - 5.10 MIL/uL   Hemoglobin 13.2 11.7 - 15.5 g/dL   HCT 69.6 29.5 - 28.4 %   MCV 79.8 (L) 80.0 - 100.0 fL   MCH 26.7 (L) 27.0 - 33.0 pg   MCHC 33.5 32.0 - 36.0 g/dL   RDW 13.2 44.0 - 10.2 %   Platelets 242 140 - 400 K/uL   MPV 8.4 7.5 - 12.5 fL  Hemoglobin A1c  Result Value Ref Range   Hgb A1c MFr Bld 5.8 (H) <5.7 %   Mean Plasma Glucose 120 mg/dL  Lipid panel   Result Value Ref Range   Cholesterol 312 (H) 125 - 200 mg/dL   Triglycerides 725 (H) <150 mg/dL   HDL 57 >=36 mg/dL   Total CHOL/HDL  Ratio 5.5 (H) <=5.0 Ratio   VLDL 63 (H) <30 mg/dL   LDL Cholesterol 960192 (H) <130 mg/dL  POCT urinalysis dipstick  Result Value Ref Range   Color, UA yellow yellow   Clarity, UA cloudy (A) clear   Glucose, UA negative negative   Bilirubin, UA negative negative   Ketones, POC UA negative negative   Spec Grav, UA 1.010    Blood, UA negative negative   pH, UA 5.0    Protein Ur, POC negative negative   Urobilinogen, UA 0.2    Nitrite, UA Negative Negative   Leukocytes, UA moderate (2+) (A) Negative

## 2016-02-17 ENCOUNTER — Ambulatory Visit (INDEPENDENT_AMBULATORY_CARE_PROVIDER_SITE_OTHER): Payer: Commercial Managed Care - HMO | Admitting: Family Medicine

## 2016-02-17 ENCOUNTER — Ambulatory Visit (INDEPENDENT_AMBULATORY_CARE_PROVIDER_SITE_OTHER): Payer: Commercial Managed Care - HMO

## 2016-02-17 VITALS — BP 128/78 | HR 109 | Temp 97.6°F | Resp 16

## 2016-02-17 DIAGNOSIS — E785 Hyperlipidemia, unspecified: Secondary | ICD-10-CM | POA: Diagnosis not present

## 2016-02-17 DIAGNOSIS — R400 Somnolence: Secondary | ICD-10-CM | POA: Diagnosis not present

## 2016-02-17 DIAGNOSIS — R350 Frequency of micturition: Secondary | ICD-10-CM | POA: Diagnosis not present

## 2016-02-17 DIAGNOSIS — R55 Syncope and collapse: Secondary | ICD-10-CM

## 2016-02-17 DIAGNOSIS — G4700 Insomnia, unspecified: Secondary | ICD-10-CM

## 2016-02-17 DIAGNOSIS — K529 Noninfective gastroenteritis and colitis, unspecified: Secondary | ICD-10-CM | POA: Diagnosis not present

## 2016-02-17 DIAGNOSIS — N183 Chronic kidney disease, stage 3 unspecified: Secondary | ICD-10-CM

## 2016-02-17 DIAGNOSIS — R05 Cough: Secondary | ICD-10-CM

## 2016-02-17 DIAGNOSIS — J069 Acute upper respiratory infection, unspecified: Secondary | ICD-10-CM

## 2016-02-17 DIAGNOSIS — R059 Cough, unspecified: Secondary | ICD-10-CM

## 2016-02-17 DIAGNOSIS — Z79899 Other long term (current) drug therapy: Secondary | ICD-10-CM | POA: Diagnosis not present

## 2016-02-17 DIAGNOSIS — N3000 Acute cystitis without hematuria: Secondary | ICD-10-CM

## 2016-02-17 DIAGNOSIS — E860 Dehydration: Secondary | ICD-10-CM

## 2016-02-17 LAB — COMPREHENSIVE METABOLIC PANEL
ALK PHOS: 61 U/L (ref 33–130)
ALT: 19 U/L (ref 6–29)
AST: 21 U/L (ref 10–35)
Albumin: 3.9 g/dL (ref 3.6–5.1)
BILIRUBIN TOTAL: 0.3 mg/dL (ref 0.2–1.2)
BUN: 10 mg/dL (ref 7–25)
CALCIUM: 9 mg/dL (ref 8.6–10.4)
CO2: 22 mmol/L (ref 20–31)
CREATININE: 1.44 mg/dL — AB (ref 0.50–0.99)
Chloride: 104 mmol/L (ref 98–110)
GLUCOSE: 112 mg/dL — AB (ref 65–99)
POTASSIUM: 4 mmol/L (ref 3.5–5.3)
Sodium: 137 mmol/L (ref 135–146)
TOTAL PROTEIN: 6.3 g/dL (ref 6.1–8.1)

## 2016-02-17 LAB — POCT CBC
Granulocyte percent: 46.5 %G (ref 37–80)
HCT, POC: 34.8 % — AB (ref 37.7–47.9)
Hemoglobin: 12.1 g/dL — AB (ref 12.2–16.2)
LYMPH, POC: 3.6 — AB (ref 0.6–3.4)
MCH: 27.2 pg (ref 27–31.2)
MCHC: 34.6 g/dL (ref 31.8–35.4)
MCV: 78.5 fL — AB (ref 80–97)
MID (CBC): 0.8 (ref 0–0.9)
MPV: 6.5 fL (ref 0–99.8)
PLATELET COUNT, POC: 238 10*3/uL (ref 142–424)
POC Granulocyte: 3.8 (ref 2–6.9)
POC LYMPH %: 44.1 % (ref 10–50)
POC MID %: 9.4 % (ref 0–12)
RBC: 4.44 M/uL (ref 4.04–5.48)
RDW, POC: 14.6 %
WBC: 8.2 10*3/uL (ref 4.6–10.2)

## 2016-02-17 LAB — POCT URINALYSIS DIP (MANUAL ENTRY)
BILIRUBIN UA: NEGATIVE
BILIRUBIN UA: NEGATIVE
Glucose, UA: NEGATIVE
NITRITE UA: NEGATIVE
PH UA: 5.5
Protein Ur, POC: NEGATIVE
RBC UA: NEGATIVE
Urobilinogen, UA: 0.2

## 2016-02-17 LAB — POC MICROSCOPIC URINALYSIS (UMFC): Mucus: ABSENT

## 2016-02-17 LAB — GLUCOSE, POCT (MANUAL RESULT ENTRY): POC GLUCOSE: 100 mg/dL — AB (ref 70–99)

## 2016-02-17 MED ORDER — IPRATROPIUM BROMIDE 0.03 % NA SOLN: 2.0000 | Freq: Four times a day (QID) | NASAL | 1 refills | Status: DC

## 2016-02-17 MED ORDER — BENZONATATE 100 MG PO CAPS: 100.0000 mg | ORAL_CAPSULE | Freq: Three times a day (TID) | ORAL | 1 refills | Status: DC | PRN

## 2016-02-17 NOTE — Patient Instructions (Addendum)
Your cholesterol was much to high and so we will likely have to talk about starting a cholesterol medication but I am concerned that that could increase muscle aches. It looks like we did have you on lipitor as well as crestor in 2014 briefly. It looks like your iron level is getting a little low - try to get more iron in your diet. Iron can be better absorbed with a little vitamin C (supplement or orange juice).  I have heard really good things about EMDR therapy - it works more rapidly than other types in helping PTSD sufferers.  Consider trying it - it might really help.    Continuing on with Dr Ferdinand Lango could be helpful.   Or there is really great psychiatrists as well as good therapists some who specialize in EMDR at [place], [place] Call Dr. Yehuda Budd - he is VERY goof [phone]   In an online search I found   --[facility], [place], [place] Call Ms. Mason Jim [phone]   --Ms. Edilia Bo, MA, Phd who seemed to have a lot of experience in this and people who suffer from PTSD due to your same history - she is at  [place] ?  Address: [address] [place] [address] Phone: [phone] --[place] Ms. Sheryl Ketner ([phone]   I have not had any personal experience with her but her certifications and history looks good.   IF you received an x-ray today, you will receive an invoice from [facility]. Please contact [facility] at [phone] questions or concerns regarding your invoice.   IF you received labwork today, you will receive an invoice from [employer] Please contact [facility] at [phone] questions or concerns regarding your invoice.   Our billing staff will  not be able to assist you with questions regarding bills from these companies.  You will be contacted with the lab results as soon as they are available. The fastest way to get your results is to activate your My Chart account. Instructions are located on the last page of this paperwork. If you have not heard from [facility]arding the results in 2 weeks, please contact this office.   Insomnia Insomnia is a sleep disorder that makes it difficult to fall asleep or to stay asleep. Insomnia can cause tiredness (fatigue), low energy, difficulty concentrating, mood swings, and poor performance at work or school.  There are three different ways to classify insomnia:  Difficulty falling asleep.  Difficulty staying asleep.  Waking up too early in the morning. Any type of insomnia can be long-term (chronic) or short-term (acute). Both are common. Short-term insomnia usually lasts for three months or less. Chronic insomnia occurs at least three times a week for longer than three months. CAUSES  Insomnia may be caused by another condition, situation, or substance, such as:  Anxiety.  Certain medicines.  Gastroesophageal reflux disease (GERD) or other gastrointestinal conditions.  Asthma or other breathing conditions.  Restless legs syndrome, sleep apnea, or other sleep disorders.  Chronic pain.  Menopause. This may include hot flashes.  Stroke.  Abuse of alcohol, tobacco, or illegal drugs.  Depression.  Caffeine.   Neurological disorders, such as Alzheimer disease.  An overactive thyroid (hyperthyroidism). The cause of insomnia may not be known.  RISK FACTORS Risk factors for insomnia include:  Gender. Women are more commonly affected than men.  Age. Insomnia is more common as you get older.  Stress. This may involve your professional or personal life.  Income. Insomnia is more common in people with lower income.  Lack of exercise.   Irregular work schedule or night  shifts.  Traveling between different time zones. SIGNS AND SYMPTOMS If you have insomnia, trouble falling asleep or trouble staying asleep is the main symptom. This may lead to other symptoms, such as:  Feeling fatigued.  Feeling nervous about going to sleep.  Not feeling rested in the morning.  Having trouble concentrating.  Feeling irritable, anxious, or depressed. TREATMENT  Treatment for insomnia depends on the cause. If your insomnia is caused by an underlying condition, treatment will focus on addressing the condition. Treatment may also include:   Medicines to help you sleep.  Counseling or therapy.  Lifestyle adjustments. HOME CARE INSTRUCTIONS   Take medicines only as directed by your health care provider.  Keep regular sleeping and waking hours. Avoid naps.  Keep a sleep diary to help you and your health care provider figure out what could be causing your insomnia. Include:   When you sleep.  When you wake up during the night.  How well you sleep.   How rested you feel the next day.  Any side effects of medicines you are taking.  What you eat and drink.   Make your bedroom a comfortable place where it is easy to fall asleep:  Put up shades or special blackout curtains to block light from outside.  Use a white noise machine to block noise.  Keep the temperature cool.   Exercise regularly as directed by your health care provider. Avoid exercising right before bedtime.  Use relaxation techniques to manage stress. Ask your health care provider to suggest some techniques that may work well for you. These may include:  Breathing exercises.  Routines to release muscle tension.  Visualizing peaceful scenes.  Cut back on alcohol, caffeinated beverages, and cigarettes, especially close to bedtime. These can disrupt your sleep.  Do not overeat or eat spicy foods right before bedtime. This can lead to digestive discomfort that can make it hard for you  to sleep.  Limit screen use before bedtime. This includes:  Watching TV.  Using your smartphone, tablet, and computer.  Stick to a routine. This can help you fall asleep faster. Try to do a quiet activity, brush your teeth, and go to bed at the same time each night.  Get out of bed if you are still awake after 15 minutes of trying to sleep. Keep the lights down, but try reading or doing a quiet activity. When you feel sleepy, go back to bed.  Make sure that you drive carefully. Avoid driving if you feel very sleepy.  Keep all follow-up appointments as directed by your health care provider. This is important. SEEK MEDICAL CARE IF:   You are tired throughout the day or have trouble in your daily routine due to sleepiness.  You continue to have sleep problems or your sleep problems get worse. SEEK IMMEDIATE MEDICAL CARE IF:   You have serious thoughts about hurting yourself or someone else.   This information is not intended to replace advice given to you by your health care provider. Make sure you discuss any questions you have with your health care provider.   Document Released: 04/30/2000 Document Revised: 01/22/2015 Document Reviewed: 02/01/2014  Elsevier Interactive Patient Education [employer].  Iron-Rich Diet Iron is a mineral that helps your body to produce hemoglobin. Hemoglobin is a protein in your red blood cells that carries oxygen to your body's tissues. Eating too little iron may cause you to feel weak and tired, and it can increase your risk for infection. Eating enough iron is necessary for your body's metabolism, muscle function, and nervous system. Iron is naturally found in many foods. It can also be added to foods or fortified in foods. There are two types of dietary iron:  Heme iron. Heme iron is absorbed by the body more easily than nonheme iron. Heme iron is found in meat, poultry, and fish.  Nonheme iron. Nonheme iron is found in dietary supplements,  iron-fortified grains, beans, and vegetables. You may need to follow an iron-rich diet if:  You have been diagnosed with iron deficiency or iron-deficiency anemia.  You have a condition that prevents you from absorbing dietary iron, such as:  Infection in your intestines.  Celiac disease. This involves long-lasting (chronic) inflammation of your intestines.  You do not eat enough iron.  You eat a diet that is high in foods that impair iron absorption.  You have lost a lot of blood.  You have heavy bleeding during your menstrual cycle.  You are pregnant. WHAT IS MY PLAN? Your health care provider may help you to determine how much iron you need per day based on your condition. Generally, when a person consumes sufficient amounts of iron in the diet, the following iron needs are met:  Men.  18-55 years old: 11 mg per day.  38-37 years old: 8 mg per day.  Women.   46-11 years old: 15 mg per day.  34-11 years old: 18 mg per day.  Over 40 years old: 8 mg per day.  Pregnant women: 27 mg per day.  Breastfeeding women: 9 mg per day. WHAT DO I NEED TO KNOW ABOUT AN IRON-RICH DIET?  Eat fresh fruits and vegetables that are high in vitamin C along with foods that are high in iron. This will help increase the amount of iron that your body absorbs from food, especially with foods containing nonheme iron. Foods that are high in vitamin C include oranges, peppers, tomatoes, and mango.  Take iron supplements only as directed by your health care provider. Overdose of iron can be life-threatening. If you were prescribed iron supplements, take them with orange juice or a vitamin C supplement.  Cook foods in pots and pans that are made from iron.   Eat nonheme iron-containing foods alongside foods that are high in heme iron. This helps to improve your iron absorption.   Certain foods and drinks contain compounds that impair iron absorption. Avoid eating these foods in the same meal  as iron-rich foods or with iron supplements. These include:  Coffee, black tea, and red wine.  Milk, dairy products, and foods that are high in calcium.  Beans, soybeans, and peas.  Whole grains.  When eating foods that contain both nonheme iron and compounds that impair iron absorption, follow these tips to absorb iron better.   Soak beans overnight before cooking.  Soak whole grains overnight and drain them before using.  Ferment flours before baking, such as using yeast in bread dough. WHAT FOODS CAN I EAT? Grains Iron-fortified breakfast cereal. Iron-fortified whole-wheat bread. Enriched rice. Sprouted grains. Vegetables Spinach. Potatoes with skin. Green peas. Broccoli. Red and green bell peppers. Fermented vegetables. Fruits Prunes.  Raisins. Oranges. Strawberries. Mango. Grapefruit. Meats and Other Protein Sources Beef liver. Oysters. Beef. Shrimp. [place]. Chicken. [place]. Sardines. Chickpeas. Nuts. Tofu. Beverages Tomato juice. Fresh orange juice. Prune juice. Hibiscus tea. Fortified instant breakfast shakes. Condiments Tahini. Fermented soy sauce. Sweets and Desserts Black-strap molasses.  Other Wheat germ. The items listed above may not be a complete list of recommended foods or beverages. Contact your dietitian for more options. WHAT FOODS ARE NOT RECOMMENDED? Grains Whole grains. Bran cereal. Bran flour. Oats. Vegetables Artichokes. Brussels sprouts. Kale. Fruits Blueberries. Raspberries. Strawberries. Figs. Meats and Other Protein Sources Soybeans. Products made from soy protein. Dairy Milk. Cream. Cheese. Yogurt. Cottage cheese. Beverages Coffee. Black tea. Red wine. Sweets and Desserts Cocoa. Chocolate. Ice cream. Other Basil. Oregano. Parsley. The items listed above may not be a complete list of foods and beverages to avoid. Contact your dietitian for more information.   This information is not intended to replace advice given to  you by your health care provider. Make sure you discuss any questions you have with your health care provider.   Document Released: 12/15/2004 Document Revised: 05/24/2014 Document Reviewed: 11/28/2013 Elsevier Interactive Patient Education [employer].
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
## 2016-02-17 NOTE — Progress Notes (Signed)
2nd bag started  Infusing without diff

## 2016-02-18 ENCOUNTER — Telehealth: Payer: Self-pay

## 2016-02-18 MED ORDER — IPRATROPIUM BROMIDE 0.03 % NA SOLN: 2.0000 | Freq: Four times a day (QID) | NASAL | 1 refills | Status: DC

## 2016-02-18 MED ORDER — BENZONATATE 100 MG PO CAPS: 100.0000 mg | ORAL_CAPSULE | Freq: Three times a day (TID) | ORAL | 1 refills | Status: DC | PRN

## 2016-02-19 LAB — URINE CULTURE

## 2016-02-20 MED ORDER — CEPHALEXIN 500 MG PO CAPS: 500.0000 mg | ORAL_CAPSULE | Freq: Two times a day (BID) | ORAL | 0 refills | Status: DC

## 2016-02-20 NOTE — Addendum Note (Signed)
Addended by: Norberto SorensonSHAW, EVA on: 02/20/2016 12:16 PM   Modules accepted: Orders

## 2016-02-21 ENCOUNTER — Other Ambulatory Visit: Payer: Self-pay | Admitting: *Deleted

## 2016-02-21 MED ORDER — CEPHALEXIN 500 MG PO CAPS: 500.0000 mg | ORAL_CAPSULE | Freq: Two times a day (BID) | ORAL | 0 refills | Status: DC

## 2016-02-21 NOTE — Telephone Encounter (Signed)
Rx resent to the correct pharmacy.

## 2016-03-02 ENCOUNTER — Other Ambulatory Visit: Payer: Self-pay | Admitting: Family Medicine

## 2016-03-08 NOTE — Telephone Encounter (Signed)
02/27/16 last ov 01/2016 last refill

## 2016-03-09 ENCOUNTER — Other Ambulatory Visit: Payer: Self-pay

## 2016-03-09 MED ORDER — IPRATROPIUM BROMIDE 0.03 % NA SOLN: 2.0000 | Freq: Four times a day (QID) | NASAL | 0 refills | Status: DC

## 2016-03-11 ENCOUNTER — Other Ambulatory Visit: Payer: Self-pay | Admitting: Family Medicine

## 2016-03-24 ENCOUNTER — Other Ambulatory Visit: Payer: Self-pay | Admitting: Family Medicine

## 2016-04-06 ENCOUNTER — Other Ambulatory Visit: Payer: Self-pay | Admitting: Family Medicine

## 2016-04-06 DIAGNOSIS — Z1231 Encounter for screening mammogram for malignant neoplasm of breast: Secondary | ICD-10-CM

## 2016-04-12 ENCOUNTER — Other Ambulatory Visit: Payer: Self-pay | Admitting: Family Medicine

## 2016-04-13 DIAGNOSIS — M1711 Unilateral primary osteoarthritis, right knee: Secondary | ICD-10-CM | POA: Diagnosis not present

## 2016-04-13 DIAGNOSIS — S83206A Unspecified tear of unspecified meniscus, current injury, right knee, initial encounter: Secondary | ICD-10-CM | POA: Diagnosis not present

## 2016-04-14 ENCOUNTER — Other Ambulatory Visit: Payer: Self-pay | Admitting: Family Medicine

## 2016-04-14 NOTE — Telephone Encounter (Signed)
02/17/16 last ov 

## 2016-04-23 ENCOUNTER — Ambulatory Visit (INDEPENDENT_AMBULATORY_CARE_PROVIDER_SITE_OTHER): Payer: Commercial Managed Care - HMO | Admitting: Family Medicine

## 2016-04-23 VITALS — BP 112/76 | HR 101 | Temp 98.4°F | Resp 16 | Wt 210.0 lb

## 2016-04-23 DIAGNOSIS — F5101 Primary insomnia: Secondary | ICD-10-CM | POA: Diagnosis not present

## 2016-04-23 DIAGNOSIS — R197 Diarrhea, unspecified: Secondary | ICD-10-CM

## 2016-04-23 DIAGNOSIS — R5382 Chronic fatigue, unspecified: Secondary | ICD-10-CM

## 2016-04-23 DIAGNOSIS — R21 Rash and other nonspecific skin eruption: Secondary | ICD-10-CM

## 2016-04-23 DIAGNOSIS — R350 Frequency of micturition: Secondary | ICD-10-CM

## 2016-04-23 DIAGNOSIS — R42 Dizziness and giddiness: Secondary | ICD-10-CM

## 2016-04-23 LAB — POCT CBC
GRANULOCYTE PERCENT: 63.4 % (ref 37–80)
HCT, POC: 41.9 % (ref 37.7–47.9)
HEMOGLOBIN: 14.1 g/dL (ref 12.2–16.2)
Lymph, poc: 3.7 — AB (ref 0.6–3.4)
MCH: 27.1 pg (ref 27–31.2)
MCHC: 33.8 g/dL (ref 31.8–35.4)
MCV: 80.3 fL (ref 80–97)
MID (cbc): 1 — AB (ref 0–0.9)
MPV: 6.6 fL (ref 0–99.8)
PLATELET COUNT, POC: 280 10*3/uL (ref 142–424)
POC Granulocyte: 8.1 — AB (ref 2–6.9)
POC LYMPH PERCENT: 28.9 %L (ref 10–50)
POC MID %: 7.7 % (ref 0–12)
RBC: 5.21 M/uL (ref 4.04–5.48)
RDW, POC: 17 %
WBC: 12.7 10*3/uL — AB (ref 4.6–10.2)

## 2016-04-23 LAB — POCT URINALYSIS DIP (MANUAL ENTRY)
Bilirubin, UA: NEGATIVE
Glucose, UA: NEGATIVE
Ketones, POC UA: NEGATIVE
NITRITE UA: NEGATIVE
PH UA: 5
Protein Ur, POC: NEGATIVE
RBC UA: NEGATIVE
SPEC GRAV UA: 1.01
UROBILINOGEN UA: 0.2

## 2016-04-23 LAB — POC MICROSCOPIC URINALYSIS (UMFC): Mucus: ABSENT

## 2016-04-23 MED ORDER — CIPROFLOXACIN HCL 500 MG PO TABS: 500.0000 mg | ORAL_TABLET | Freq: Two times a day (BID) | ORAL | 0 refills | Status: DC

## 2016-04-23 MED ORDER — PROMETHAZINE HCL 25 MG PO TABS: 25.0000 mg | ORAL_TABLET | Freq: Three times a day (TID) | ORAL | 2 refills | Status: DC | PRN

## 2016-04-23 NOTE — Patient Instructions (Addendum)
Look at the food diet. If there are anything in a certain group that seems to exacerbate your symptoms I highly recommend eliminating everything in that group. It is easiest to a limiting everything and see if your symptoms resolve. Then you can gradually add back in some of the other foods and make sure they don't return.  Consider trying mint supplementation such as herbal mint tea or altoids to soothe your stomach. This can make heartburn worse.  I sent promethazine a nausea medicine to your pharmacy. If this doesn't work or makes you fatigued we can switch you over to Zofran which dissolves under your tongue.  I highly think he would benefit from a sleep study as I suspect you are never getting a full rem cycle.     IF you received an x-ray today, you will receive an invoice from [facility]. Please contact [facility] at [phone] with questions or concerns regarding your invoice.   IF you received labwork today, you will receive an invoice from [facility]. Please contact [facility] at [phone] with questions or concerns regarding your invoice.   Our billing staff will not be able to assist you with questions regarding bills from these companies.  You will be contacted with the lab results as soon as they are available. The fastest way to get your results is to activate your My Chart account. Instructions are located on the last page of this paperwork. If you have not heard from us regarding the results in 2 weeks, please contact this office.    Irritable Bowel Syndrome, Adult Irritable bowel syndrome (IBS) is not one specific disease. It is a group of symptoms that affects the organs responsible for digestion (gastrointestinal or GI tract). To regulate how your GI tract works, your body sends signals back and forth between your intestines and your brain. If you have IBS, there may be a problem with these signals. As a result, your GI tract  does not function normally. Your intestines may become more sensitive and overreact to certain things. This is especially true when you eat certain foods or when you are under stress. There are four types of IBS. These may be determined based on the consistency of your stool:  IBS with diarrhea.  IBS with constipation.  Mixed IBS.  Unsubtyped IBS. It is important to know which type of IBS you have. Some treatments are more likely to be helpful for certain types of IBS. What are the causes? The exact cause of IBS is not known. What increases the risk? You may have a higher risk of IBS if:  You are a woman.  You are younger than 62 years old.  You have a family history of IBS.  You have mental health problems.  You have had bacterial infection of your GI tract. What are the signs or symptoms? Symptoms of IBS vary from person to person. The main symptom is abdominal pain or discomfort. Additional symptoms usually include one or more of the following:  Diarrhea, constipation, or both.  Abdominal swelling or bloating.  Feeling full or sick after eating a small or regular-size meal.  Frequent gas.  Mucus in the stool.  A feeling of having more stool left after a bowel movement. Symptoms tend to come and go. They may be associated with stress, psychiatric conditions, or nothing at all. How is this diagnosed? There is no specific test to diagnose IBS. Your health care provider will make a diagnosis based on a  physical exam, medical history, and your symptoms. You may have other tests to rule out other conditions that may be causing your symptoms. These may include:  Blood tests.  X-rays.  CT scan.  Endoscopy and colonoscopy. This is a test in which your GI tract is viewed with a long, thin, flexible tube. How is this treated? There is no cure for IBS, but treatment can help relieve symptoms. IBS treatment often includes:  Changes to your diet, such as:  Eating more  fiber.  Avoiding foods that cause symptoms.  Drinking more water.  Eating regular, medium-sized portioned meals.  Medicines. These may include:  Fiber supplements if you have constipation.  Medicine to control diarrhea (antidiarrheal medicines).  Medicine to help control muscle spasms in your GI tract (antispasmodic medicines).  Medicines to help with any mental health issues, such as antidepressants or tranquilizers.  Therapy.  Talk therapy may help with anxiety, depression, or other mental health issues that can make IBS symptoms worse.  Stress reduction.  Managing your stress can help keep symptoms under control. Follow these instructions at home:  Take medicines only as directed by your health care provider.  Eat a healthy diet.  Avoid foods and drinks with added sugar.  Include more whole grains, fruits, and vegetables gradually into your diet. This may be especially helpful if you have IBS with constipation.  Avoid any foods and drinks that make your symptoms worse. These may include dairy products and caffeinated or carbonated drinks.  Do not eat large meals.  Drink enough fluid to keep your urine clear or pale yellow.  Exercise regularly. Ask your health care provider for recommendations of good activities for you.  Keep all follow-up visits as directed by your health care provider. This is important. Contact a health care provider if:  You have constant pain.  You have trouble or pain with swallowing.  You have worsening diarrhea. Get help right away if:  You have severe and worsening abdominal pain.  You have diarrhea and:  You have a rash, stiff neck, or severe headache.  You are irritable, sleepy, or difficult to awaken.  You are weak, dizzy, or extremely thirsty.  You have bright red blood in your stool or you have black tarry stools.  You have unusual abdominal swelling that is painful.  You vomit continuously.  You vomit blood  (hematemesis).  You have both abdominal pain and a fever. This information is not intended to replace advice given to you by your health care provider. Make sure you discuss any questions you have with your health care provider. Document Released: 05/03/2005 Document Revised: 10/03/2015 Document Reviewed: 01/18/2014 Elsevier Interactive Patient Education  2017 Elsevier Inc.   Diet for Irritable Bowel Syndrome Introduction When you have irritable bowel syndrome (IBS), the foods you eat and your eating habits are very important. IBS may cause various symptoms, such as abdominal pain, constipation, or diarrhea. Choosing the right foods can help ease discomfort caused by these symptoms. Work with your health care provider and dietitian to find the best eating plan to help control your symptoms. What general guidelines do I need to follow?  Keep a food diary. This will help you identify foods that cause symptoms. Write down:  What you eat and when.  What symptoms you have.  When symptoms occur in relation to your meals.  Avoid foods that cause symptoms. Talk with your dietitian about other ways to get the same nutrients that are in these foods.  Eat  more foods that contain fiber. Take a fiber supplement if directed by your dietitian.  Eat your meals slowly, in a relaxed setting.  Aim to eat 5-6 small meals per day. Do not skip meals.  Drink enough fluids to keep your urine clear or pale yellow.  Ask your health care provider if you should take an over-the-counter probiotic during flare-ups to help restore healthy gut bacteria.  If you have cramping or diarrhea, try making your meals low in fat and high in carbohydrates. Examples of carbohydrates are pasta, rice, whole grain breads and cereals, fruits, and vegetables.  If dairy products cause your symptoms to flare up, try eating less of them. You might be able to handle yogurt better than other dairy products because it contains  bacteria that help with digestion. What foods are not recommended? The following are some foods and drinks that may worsen your symptoms:  Fatty foods, such as [place] fries.  Milk products, such as cheese or ice cream.  Chocolate.  Alcohol.  Products with caffeine, such as coffee.  Carbonated drinks, such as soda. The items listed above may not be a complete list of foods and beverages to avoid. Contact your dietitian for more information.  What foods are good sources of fiber? Your health care provider or dietitian may recommend that you eat more foods that contain fiber. Fiber can help reduce constipation and other IBS symptoms. Add foods with fiber to your diet a little at a time so that your body can get used to them. Too much fiber at once might cause gas and swelling of your abdomen. The following are some foods that are good sources of fiber:  Apples.  Peaches.  Pears.  Berries.  Figs.  Broccoli (raw).  Cabbage.  Carrots.  Raw peas.  Kidney beans.  Lima beans.  Whole grain bread.  Whole grain cereal. Where to find more information: [employer] for Functional Gastrointestinal Disorders: www.iffgd.[employer] National Institute of Diabetes and Digestive and Kidney Diseases: http://norris-lawson.com/www.niddk.nih.gov/health-information/health-topics/digestive-diseases/ibs/Pages/facts.aspx This information is not intended to replace advice given to you by your health care provider. Make sure you discuss any questions you have with your health care provider. Document Released: 07/24/2003 Document Revised: 10/09/2015 Document Reviewed: 08/03/2013  2017 Elsevier

## 2016-04-23 NOTE — Progress Notes (Addendum)
Subjective:  By signing my name below, I, [name], attest that this documentation has been prepared under the direction and in the presence of Norberto Sorenson, MD Electronically Signed: Charline Bills, ED Scribe 04/23/2016 at 2:56 PM.   Patient ID: Jennifer Davidson, female    DOB: 08-08-1955, 60 y.o.   MRN: [id number]  Chief Complaint  Patient presents with  . Diarrhea    last few weeks   HPI HPI Comments: Jennifer Davidson is a 60 y.o. female who presents to the [facility] complaining of ongoing diarrhea for the past 3 weeks. Pt reports associated abdominal cramping that temporary resolves after diarrhea. She also reports symptoms of fatigue, nausea, intermittent vomiting, occasional melena, erratic sleep, urinary frequency, intermittent light-headedness and dizziness. Pt has tried Bentyl with mild relief of cramping and Imodium and Lomotil with only temporary relief. She states that she is able to keep down peanut butter sandwiches sometimes. Pt denies changes in urine color. She is not using probiotics.   Pt has also noticed a pruritic rash with blisters to her back that her husband attributes to increased stress. She has tried dermoplast spray without significant relief. Her last flare-up was approximately 9 months ago.   Past Medical History:  Diagnosis Date  . Anxiety   . Cholesterol serum increased   . Chronic back pain   . Chronic kidney disease   . Depression   . Diastolic heart failure (HCC)   . GERD (gastroesophageal reflux disease)   . Hyperparathyroidism (HCC)   . Hypertension   . Hypothyroidism   . Migraine   . Neuromuscular disorder (HCC)   . Sleep apnea   . Tachycardia    Current Outpatient Prescriptions on File Prior to Visit  Medication Sig Dispense Refill  . ALPRAZolam (XANAX XR) 1 MG 24 hr tablet Take 1 tablet (1 mg total) by mouth daily. 30 tablet 5  . ALPRAZolam (XANAX) 1 MG tablet TAKE 1 TABLET TWICE DAILY AS NEEDED FOR ANXIETY 180 tablet 1  .  Calcium-Magnesium-Vitamin D (CALCIUM 1200+D3 PO) Take 1 tablet by mouth daily. Contains 800iu Vitamin D3    . cholecalciferol (VITAMIN D) 1000 UNITS tablet Take 1 tablet (1,000 Units total) by mouth daily at 12 noon. 30 tablet 0  . dicyclomine (BENTYL) 20 MG tablet TAKE 1 TABLET FOUR TIMES A DAY BEFORE MEALS AND AT BEDTIME AS NEEDED FOR CRAMPING IRRITABLE BOWELS 180 tablet 0  . diphenoxylate-atropine (LOMOTIL) 2.5-0.025 MG tablet TAKE 1 TABLET BY MOUTH 4 TIMES A DAY AS NEEDED FOR DIARRHEA 30 tablet 3  . DULoxetine (CYMBALTA) 60 MG capsule TAKE 1 CAPSULE (60 MG TOTAL) BY MOUTH DAILY. 90 capsule 1  . esomeprazole (NEXIUM) 40 MG capsule TAKE 1 CAPSULE (40 MG TOTAL) BY MOUTH DAILY. 90 capsule 3  . furosemide (LASIX) 40 MG tablet TAKE 1 TABLET (40 MG TOTAL) BY MOUTH DAILY. 90 tablet 1  . gabapentin (NEURONTIN) 300 MG capsule Take 2 capsules (600 mg total) by mouth 3 (three) times daily. 540 capsule 3  . ipratropium (ATROVENT) 0.03 % nasal spray Place 2 sprays into the nose 4 (four) times daily. 90 mL 0  . levothyroxine (SYNTHROID, LEVOTHROID) 50 MCG tablet TAKE 1 TABLET EVERY DAY 90 tablet 0  . lidocaine (LIDODERM) 5 % PLACE 1 PATCH ONTO THE SKIN DAILY. REMOVE & DISCARD PATCH WITHIN 12 HOURS OR AS DIRECTED BY MD  11  . methocarbamol (ROBAXIN) 750 MG tablet Take 1 tablet (750 mg total) by mouth  every 6 (six) hours as needed for muscle spasms. 360 tablet 1  . potassium chloride SA (K-DUR,KLOR-CON) 20 MEQ tablet TAKE 1 TABLET (20 MEQ TOTAL) BY MOUTH 2 (TWO) TIMES DAILY. 180 tablet 1  . rizatriptan (MAXALT) 10 MG tablet Take 0.5 tablets (5 mg total) by mouth as needed for migraine. May repeat in 2 hours if needed 10 tablet 5  . sucralfate (CARAFATE) 1 g tablet Take 1 tablet (1 g total) by mouth 4 (four) times daily -  with meals and at bedtime. 360 tablet 1  . topiramate (TOPAMAX) 100 MG tablet TAKE 1/2 TABLET BY MOUTH IN THE MORNING AND 1 TAB AT BEDTIME 135 tablet 3  . esomeprazole (NEXIUM) 40 MG capsule  TAKE 1 CAPSULE (40 MG TOTAL) BY MOUTH DAILY. (Patient not taking: Reported on 04/23/2016) 90 capsule 0   No current facility-administered medications on file prior to visit.    Allergies  Allergen Reactions  . Tramadol Nausea And Vomiting  . Trazodone And Nefazodone Other (See Comments)    Per pt trazodone caused hallucinations and behavior changes    Depression screen [facility] 2[facility] 04/23/2016 01/29/2016 11/12/2015 10/23/2015 09/18/2015  Decreased Interest 0 0 0 2 0  Down, Depressed, Hopeless 0 0 0 2 0  PHQ - 2 Score 0 0 0 4 0  Altered sleeping - - 0 2 -  Tired, decreased energy - - 0 2 -  Change in appetite - - 0 1 -  Feeling bad or failure about yourself  - - 0 1 -  Trouble concentrating - - 0 0 -  Moving slowly or fidgety/restless - - 0 1 -  Suicidal thoughts - - 0 0 -  PHQ-9 Score - - 0 11 -  Difficult doing work/chores - - - - -    Review of Systems  Constitutional: Positive for activity change, appetite change, chills and fatigue. Negative for fever and unexpected weight change.  Cardiovascular: Negative for leg swelling.  Gastrointestinal: Positive for abdominal distention, abdominal pain, diarrhea, nausea, rectal pain and vomiting. Negative for anal bleeding, blood in stool and constipation.  Genitourinary: Positive for frequency. Negative for decreased urine volume, difficulty urinating, dysuria, enuresis and hematuria.  Musculoskeletal: Positive for arthralgias, back pain and gait problem.  Skin: Positive for rash.  Neurological: Positive for dizziness, weakness and light-headedness.  Psychiatric/Behavioral: Positive for behavioral problems and sleep disturbance. Negative for agitation, confusion, dysphoric mood and hallucinations. The patient is not nervous/anxious and is not hyperactive.       Objective:   Physical Exam  Constitutional: She is oriented to person, place, and time. She appears well-developed and well-nourished. No distress.  HENT:  Head: Normocephalic and  atraumatic.  Eyes: Conjunctivae and EOM are normal.  Neck: Neck supple. No tracheal deviation present.  Cardiovascular: Normal rate, regular rhythm and normal heart sounds.   Pulmonary/Chest: Effort normal and breath sounds normal. No respiratory distress.  Abdominal: There is tenderness in the right upper quadrant and suprapubic area. There is CVA tenderness (mild). There is no rebound and no guarding.  Musculoskeletal: Normal range of motion.  Neurological: She is alert and oriented to person, place, and time.  Skin: Skin is warm and dry. Rash noted.  R lower flank 4x5 cm erythema blanching macule with 1x2 cm cluster of ulcerated vesicals in the center with a few papules outlining the rash with serpiginous border.  Psychiatric: She has a normal mood and affect. Her behavior is normal.  Nursing note and vitals reviewed.  BP  112/76 (BP Location: Right Arm, Patient Position: Sitting, Cuff Size: Large)   Pulse (!) 101   Temp 98.4 F (36.9 C)   Resp 16   Wt 210 lb (95.3 kg)   SpO2 97%   BMI 37.80 kg/m     Orthostatic VS for the past 24 hrs:  BP- Lying Pulse- Lying BP- Sitting Pulse- Sitting BP- Standing at 0 minutes Pulse- Standing at 0 minutes  04/23/16 1522 115/79 76 119/82 77 121/85 82  Negative orthostatics.  Assessment & Plan:   1. Diarrhea, unspecified type - I suspect pt has IBS as does have weeks of abdominal cramping and bloating with severe cramping relieved by freq episodes of diarrhea. These wks long episodes do seem to occur during times of stress. Pt has imodium, loperamide, and bentyl at home to use prn.  Could do trial of rifaximin if sxs cont.  For now, try mint supp, gave handouts on FODMAPs elimination diet which pt is going to try. Consider probiotics. S/p 1L IVF for hydration.  2. Urinary frequency - recurrent UTIs, assume secondary to the diarrhea. UA appears benign but pt does have a wbc which is likely secondary to the stress with emesis and diarrhea but if sxs  worsen or pt develops f/c, ok to start course of cipro.  3. Chronic fatigue   4. Primary insomnia - rec sleep study/neurology referral - will discuss further at f/u - I suspect pt is not actually getting into any deep sleep - maybe ever. She has not really responded to sleep meds and need to be cautious of sedatives since they can worsen her depression.  She is stable on alprazolam XR 1 mg qam and IR 1mg  qhs but she either pushes through the fatigue of her evening dose or wakes after 1-2 hrs.  5. Lightheadedness   6. Rash and nonspecific skin eruption - from stress    Orders Placed This Encounter  Procedures  . Urine culture  . Comprehensive metabolic panel  . Lipase  . Orthostatic vital signs  . POCT urinalysis dipstick  . POCT Microscopic Urinalysis ([facility])  . POCT CBC  . Insert peripheral IV    Meds ordered this encounter  Medications  . promethazine (PHENERGAN) 25 MG tablet    Sig: Take 1 tablet (25 mg total) by mouth every 8 (eight) hours as needed for nausea or vomiting.    Dispense:  20 tablet    Refill:  2  . ciprofloxacin (CIPRO) 500 MG tablet    Sig: Take 1 tablet (500 mg total) by mouth 2 (two) times daily.    Dispense:  10 tablet    Refill:  0   Over 40 min spent in face-to-face evaluation of and consultation with patient and coordination of care.  Over 50% of this time was spent counseling this patient.   I personally performed the services described in this documentation, which was scribed in my presence. The recorded information has been reviewed and considered, and addended by me as needed.   Norberto SorensonEva Shaw, M.D.  [facility]  [place] [address] [place], [place] [address] [phone] phone [phone] fax  04/24/16 11:32 PM  Results for orders placed or performed in visit on 04/23/16  Comprehensive metabolic panel  Result Value Ref Range   Glucose 102 (H) 65 - 99 mg/dL   BUN 8 8 - 27 mg/dL   Creatinine, Ser 1.301.37 (H) 0.57 - 1.00  mg/dL   GFR calc non Af  Amer 41 (L) >59 mL/min/1.73   GFR calc Af Amer 48 (L) >59 mL/min/1.73   BUN/Creatinine Ratio 6 (L) 12 - 28   Sodium 141 134 - 144 mmol/L   Potassium 4.0 3.5 - 5.2 mmol/L   Chloride 98 96 - 106 mmol/L   CO2 23 18 - 29 mmol/L   Calcium 9.7 8.7 - 10.3 mg/dL   Total Protein 8.1 6.0 - 8.5 g/dL   Albumin 4.7 3.6 - 4.8 g/dL   Globulin, Total 3.4 1.5 - 4.5 g/dL   Albumin/Globulin Ratio 1.4 1.2 - 2.2   Bilirubin Total 0.3 0.0 - 1.2 mg/dL   Alkaline Phosphatase 124 (H) 39 - 117 IU/L   AST 20 0 - 40 IU/L   ALT 21 0 - 32 IU/L  Lipase  Result Value Ref Range   Lipase 31 14 - 72 U/L  POCT urinalysis dipstick  Result Value Ref Range   Color, UA yellow yellow   Clarity, UA clear clear   Glucose, UA negative negative   Bilirubin, UA negative negative   Ketones, POC UA negative negative   Spec Grav, UA 1.010    Blood, UA negative negative   pH, UA 5.0    Protein Ur, POC negative negative   Urobilinogen, UA 0.2    Nitrite, UA Negative Negative   Leukocytes, UA Trace (A) Negative  POCT Microscopic Urinalysis ([facility])  Result Value Ref Range   WBC,UR,HPF,POC None None WBC/hpf   RBC,UR,HPF,POC None None RBC/hpf   Bacteria None None, Too numerous to count   Mucus Absent Absent   Epithelial Cells, UR Per Microscopy Few (A) None, Too numerous to count cells/hpf  POCT CBC  Result Value Ref Range   WBC 12.7 (A) 4.6 - 10.2 K/uL   Lymph, poc 3.7 (A) 0.6 - 3.4   POC LYMPH PERCENT 28.9 10 - 50 %L   MID (cbc) 1.0 (A) 0 - 0.9   POC MID % 7.7 0 - 12 %M   POC Granulocyte 8.1 (A) 2 - 6.9   Granulocyte percent 63.4 37 - 80 %G   RBC 5.21 4.04 - 5.48 M/uL   Hemoglobin 14.1 12.2 - 16.2 g/dL   HCT, POC 16.1 09.6 - 47.9 %   MCV 80.3 80 - 97 fL   MCH, POC 27.1 27 - 31.2 pg   MCHC 33.8 31.8 - 35.4 g/dL   RDW, POC 04.5 %   Platelet Count, POC 280 142 - 424 K/uL   MPV 6.6 0 - 99.8 fL

## 2016-04-24 LAB — COMPREHENSIVE METABOLIC PANEL
A/G RATIO: 1.4 (ref 1.2–2.2)
ALBUMIN: 4.7 g/dL (ref 3.6–4.8)
ALK PHOS: 124 IU/L — AB (ref 39–117)
ALT: 21 IU/L (ref 0–32)
AST: 20 IU/L (ref 0–40)
BUN / CREAT RATIO: 6 — AB (ref 12–28)
BUN: 8 mg/dL (ref 8–27)
Bilirubin Total: 0.3 mg/dL (ref 0.0–1.2)
CO2: 23 mmol/L (ref 18–29)
CREATININE: 1.37 mg/dL — AB (ref 0.57–1.00)
Calcium: 9.7 mg/dL (ref 8.7–10.3)
Chloride: 98 mmol/L (ref 96–106)
GFR calc Af Amer: 48 mL/min/{1.73_m2} — ABNORMAL LOW (ref >59)
GFR calc non Af Amer: 41 mL/min/{1.73_m2} — ABNORMAL LOW (ref >59)
GLOBULIN, TOTAL: 3.4 g/dL (ref 1.5–4.5)
Glucose: 102 mg/dL — ABNORMAL HIGH (ref 65–99)
Potassium: 4 mmol/L (ref 3.5–5.2)
SODIUM: 141 mmol/L (ref 134–144)
Total Protein: 8.1 g/dL (ref 6.0–8.5)

## 2016-04-24 LAB — LIPASE: LIPASE: 31 U/L (ref 14–72)

## 2016-04-25 LAB — URINE CULTURE

## 2016-04-28 DIAGNOSIS — Z9989 Dependence on other enabling machines and devices: Secondary | ICD-10-CM | POA: Diagnosis not present

## 2016-04-28 DIAGNOSIS — E039 Hypothyroidism, unspecified: Secondary | ICD-10-CM | POA: Diagnosis not present

## 2016-04-28 DIAGNOSIS — I5030 Unspecified diastolic (congestive) heart failure: Secondary | ICD-10-CM | POA: Diagnosis not present

## 2016-04-28 DIAGNOSIS — G4733 Obstructive sleep apnea (adult) (pediatric): Secondary | ICD-10-CM | POA: Diagnosis not present

## 2016-04-28 DIAGNOSIS — E785 Hyperlipidemia, unspecified: Secondary | ICD-10-CM | POA: Diagnosis not present

## 2016-04-28 DIAGNOSIS — I10 Essential (primary) hypertension: Secondary | ICD-10-CM | POA: Diagnosis not present

## 2016-04-28 DIAGNOSIS — G709 Myoneural disorder, unspecified: Secondary | ICD-10-CM | POA: Diagnosis not present

## 2016-04-28 DIAGNOSIS — N183 Chronic kidney disease, stage 3 (moderate): Secondary | ICD-10-CM | POA: Diagnosis not present

## 2016-04-29 ENCOUNTER — Ambulatory Visit
Admission: RE | Admit: 2016-04-29 | Discharge: 2016-04-29 | Disposition: A | Discharge status: Home / Self Care | Payer: Commercial Managed Care - HMO | Source: Ambulatory Visit | Attending: Family Medicine | Admitting: Family Medicine

## 2016-04-29 DIAGNOSIS — Z1231 Encounter for screening mammogram for malignant neoplasm of breast: Secondary | ICD-10-CM | POA: Diagnosis not present

## 2016-04-29 DIAGNOSIS — R197 Diarrhea, unspecified: Secondary | ICD-10-CM | POA: Diagnosis not present

## 2016-05-18 DIAGNOSIS — S83206A Unspecified tear of unspecified meniscus, current injury, right knee, initial encounter: Secondary | ICD-10-CM | POA: Diagnosis not present

## 2016-05-18 DIAGNOSIS — M25561 Pain in right knee: Secondary | ICD-10-CM | POA: Diagnosis not present

## 2016-05-28 DIAGNOSIS — M25561 Pain in right knee: Secondary | ICD-10-CM | POA: Diagnosis not present

## 2016-05-31 ENCOUNTER — Other Ambulatory Visit: Payer: Self-pay | Admitting: Family Medicine

## 2016-06-07 ENCOUNTER — Other Ambulatory Visit: Payer: Self-pay | Admitting: Family Medicine

## 2016-06-07 NOTE — Telephone Encounter (Signed)
Pt calling about refill rx for xanax please send to the mail order and call pt

## 2016-06-08 DIAGNOSIS — M25561 Pain in right knee: Secondary | ICD-10-CM | POA: Diagnosis not present

## 2016-06-09 NOTE — Telephone Encounter (Signed)
Pt was given a 6 month supply of alprazolam 1mg  bid on 9/14 so shouldn't need until March. Does she want me to send one in now with permission for them to fill it in March? Or did she not get the refill on the 3 mo Sept rx?  Or did the pharmacy change? Or is she using more? (If it is the last one, than I would like her to come in for a visit or let me help her find a psychiatrist who takes her insurance which I know has been a challenge.)

## 2016-06-12 NOTE — Telephone Encounter (Signed)
Please call pt to see why the early refill as noted below

## 2016-06-14 NOTE — Telephone Encounter (Signed)
Spoke to pt. Pt states that she does not believe that it is too early for a refill. Per chart, it seems she last received a prescription in September (01/29/16). Pt states that she has 7 pills left. Please advise.

## 2016-06-14 NOTE — Telephone Encounter (Signed)
Please call patient per Dr. Alver FisherShaw's message below.

## 2016-06-15 MED ORDER — ALPRAZOLAM 1 MG PO TABS: ORAL_TABLET | ORAL | 1 refills | Status: DC

## 2016-06-15 NOTE — Telephone Encounter (Signed)
So it appears on the [facility] that pt had a 6 mo rx for alpazolam 1mg  po bid written on 8/8 which she filled on 8/10 and again on 10/30 - it which case she WOULD be due for a rx. That would mean the rx written on 01/29/16 was never filled and if found or on file with the pharmacy should be shredded/cancelled. Please let pt know that our system showed a refill which it appears she never received or filled - which is what has led to the confusion and delay in this refill. Please confirm with pt that her current alprazolam bottle was filled on 10/30 and see whether pt would like [facility] [place] or [place].

## 2016-06-15 NOTE — Telephone Encounter (Signed)
She received a 6 month supply on 01/29/16 - #180 tabs with 1 refill so refill would not be due until 07/29/15 What is the date on the bottle when it was last filled?   Is it [facility] or from [facility]? How about her XR xanax - where is she on that?

## 2016-06-16 DIAGNOSIS — M23221 Derangement of posterior horn of medial meniscus due to old tear or injury, right knee: Secondary | ICD-10-CM | POA: Diagnosis not present

## 2016-06-16 DIAGNOSIS — M6751 Plica syndrome, right knee: Secondary | ICD-10-CM | POA: Diagnosis not present

## 2016-06-16 DIAGNOSIS — G8918 Other acute postprocedural pain: Secondary | ICD-10-CM | POA: Diagnosis not present

## 2016-06-16 DIAGNOSIS — M94261 Chondromalacia, right knee: Secondary | ICD-10-CM | POA: Diagnosis not present

## 2016-06-16 DIAGNOSIS — M23241 Derangement of anterior horn of lateral meniscus due to old tear or injury, right knee: Secondary | ICD-10-CM | POA: Diagnosis not present

## 2016-06-18 ENCOUNTER — Other Ambulatory Visit: Payer: Self-pay | Admitting: Family Medicine

## 2016-06-21 ENCOUNTER — Other Ambulatory Visit: Payer: Self-pay | Admitting: Family Medicine

## 2016-06-23 ENCOUNTER — Ambulatory Visit (INDEPENDENT_AMBULATORY_CARE_PROVIDER_SITE_OTHER): Payer: Commercial Managed Care - HMO | Admitting: Emergency Medicine

## 2016-06-23 VITALS — BP 128/78 | HR 97 | Temp 98.1°F | Resp 18 | Ht 62.5 in | Wt 214.0 lb

## 2016-06-23 DIAGNOSIS — R112 Nausea with vomiting, unspecified: Secondary | ICD-10-CM

## 2016-06-23 DIAGNOSIS — K529 Noninfective gastroenteritis and colitis, unspecified: Secondary | ICD-10-CM | POA: Diagnosis not present

## 2016-06-23 DIAGNOSIS — R197 Diarrhea, unspecified: Secondary | ICD-10-CM

## 2016-06-23 LAB — POCT CBC
Granulocyte percent: 64.2 %G (ref 37–80)
HCT, POC: 35.4 % — AB (ref 37.7–47.9)
HEMOGLOBIN: 12.1 g/dL — AB (ref 12.2–16.2)
LYMPH, POC: 2.9 (ref 0.6–3.4)
MCH: 28.2 pg (ref 27–31.2)
MCHC: 34.3 g/dL (ref 31.8–35.4)
MCV: 82.2 fL (ref 80–97)
MID (cbc): 0.5 (ref 0–0.9)
MPV: 6.5 fL (ref 0–99.8)
PLATELET COUNT, POC: 256 10*3/uL (ref 142–424)
POC Granulocyte: 6.2 (ref 2–6.9)
POC LYMPH PERCENT: 30.3 %L (ref 10–50)
POC MID %: 5.5 % (ref 0–12)
RBC: 4.3 M/uL (ref 4.04–5.48)
RDW, POC: 15.1 %
WBC: 9.7 10*3/uL (ref 4.6–10.2)

## 2016-06-23 MED ORDER — ONDANSETRON HCL 4 MG/2ML IJ SOLN: 4.0000 mg | Freq: Once | INTRAMUSCULAR | Status: DC

## 2016-06-23 MED ORDER — ONDANSETRON 4 MG PO TBDP
4.0000 mg | ORAL_TABLET | Freq: Once | ORAL | Status: AC
  Administered 2016-06-23: 4 mg via ORAL

## 2016-06-23 MED ORDER — ONDANSETRON HCL 4 MG PO TABS: 4.0000 mg | ORAL_TABLET | Freq: Three times a day (TID) | ORAL | 0 refills | Status: DC | PRN

## 2016-06-23 NOTE — Telephone Encounter (Signed)
Called into pharmacy

## 2016-06-23 NOTE — Patient Instructions (Addendum)
   IF you received an x-ray today, you will receive an invoice from [facility]. Please contact  Radiology at [phone] with questions or concerns regarding your invoice.   IF you received labwork today, you will receive an invoice from [facility]. Please contact [facility] at [phone] with questions or concerns regarding your invoice.   Our billing staff will not be able to assist you with questions regarding bills from these companies.  You will be contacted with the lab results as soon as they are available. The fastest way to get your results is to activate your My Chart account. Instructions are located on the last page of this paperwork. If you have not heard from us regarding the results in 2 weeks, please contact this office.     Food Choices to Help Relieve Diarrhea, Adult When you have diarrhea, the foods you eat and your eating habits are very important. Choosing the right foods and drinks can help relieve diarrhea. Also, because diarrhea can last up to 7 days, you need to replace lost fluids and electrolytes (such as sodium, potassium, and chloride) in order to help prevent dehydration. What general guidelines do I need to follow?  Slowly drink 1 cup (8 oz) of fluid for each episode of diarrhea. If you are getting enough fluid, your urine will be clear or pale yellow.  Eat starchy foods. Some good choices include white rice, white toast, pasta, low-fiber cereal, baked potatoes (without the skin), saltine crackers, and bagels.  Avoid large servings of any cooked vegetables.  Limit fruit to two servings per day. A serving is  cup or 1 small piece.  Choose foods with less than 2 g of fiber per serving.  Limit fats to less than 8 tsp (38 g) per day.  Avoid fried foods.  Eat foods that have probiotics in them. Probiotics can be found in certain dairy products.  Avoid foods and beverages that may increase the speed at which food moves through the  stomach and intestines (gastrointestinal tract). Things to avoid include:  High-fiber foods, such as dried fruit, raw fruits and vegetables, nuts, seeds, and whole grain foods.  Spicy foods and high-fat foods.  Foods and beverages sweetened with high-fructose corn syrup, honey, or sugar alcohols such as xylitol, sorbitol, and mannitol. What foods are recommended? Grains  White rice. White, French, or pita breads (fresh or toasted), including plain rolls, buns, or bagels. White pasta. Saltine, soda, or graham crackers. Pretzels. Low-fiber cereal. Cooked cereals made with water (such as cornmeal, farina, or cream cereals). Plain muffins. Matzo. Melba toast. Zwieback. Vegetables  Potatoes (without the skin). Strained tomato and vegetable juices. Most well-cooked and canned vegetables without seeds. Tender lettuce. Fruits  Cooked or canned applesauce, apricots, cherries, fruit cocktail, grapefruit, peaches, pears, or plums. Fresh bananas, apples without skin, cherries, grapes, cantaloupe, grapefruit, peaches, oranges, or plums. Meat and Other Protein Products  Baked or boiled chicken. Eggs. Tofu. Fish. Seafood. Smooth peanut butter. Ground or well-cooked tender beef, ham, veal, lamb, pork, or poultry. Dairy  Plain yogurt, kefir, and unsweetened liquid yogurt. Lactose-free milk, buttermilk, or soy milk. Plain hard cheese. Beverages  Sport drinks. Clear broths. Diluted fruit juices (except prune). Regular, caffeine-free sodas such as ginger ale. Water. Decaffeinated teas. Oral rehydration solutions. Sugar-free beverages not sweetened with sugar alcohols. Other  Bouillon, broth, or soups made from recommended foods. The items listed above may not be a complete list of recommended foods or beverages. Contact your dietitian for more options.    What foods are not recommended? Grains  Whole grain, whole wheat, bran, or rye breads, rolls, pastas, crackers, and cereals. Wild or brown rice. Cereals that  contain more than 2 g of fiber per serving. Corn tortillas or taco shells. Cooked or dry oatmeal. Granola. Popcorn. Vegetables  Raw vegetables. Cabbage, broccoli, Brussels sprouts, artichokes, baked beans, beet greens, corn, kale, legumes, peas, sweet potatoes, and yams. Potato skins. Cooked spinach and cabbage. Fruits  Dried fruit, including raisins and dates. Raw fruits. Stewed or dried prunes. Fresh apples with skin, apricots, mangoes, pears, raspberries, and strawberries. Meat and Other Protein Products  Chunky peanut butter. Nuts and seeds. Beans and lentils. Bacon. Dairy  High-fat cheeses. Milk, chocolate milk, and beverages made with milk, such as milk shakes. Cream. Ice cream. Sweets and Desserts  Sweet rolls, doughnuts, and sweet breads. Pancakes and waffles. Fats and Oils  Butter. Cream sauces. Margarine. Salad oils. Plain salad dressings. Olives. Avocados. Beverages  Caffeinated beverages (such as coffee, tea, soda, or energy drinks). Alcoholic beverages. Fruit juices with pulp. Prune juice. Soft drinks sweetened with high-fructose corn syrup or sugar alcohols. Other  Coconut. Hot sauce. Chili powder. Mayonnaise. Gravy. Cream-based or milk-based soups. The items listed above may not be a complete list of foods and beverages to avoid. Contact your dietitian for more information.  What should I do if I become dehydrated? Diarrhea can sometimes lead to dehydration. Signs of dehydration include dark urine and dry mouth and skin. If you think you are dehydrated, you should rehydrate with an oral rehydration solution. These solutions can be purchased at pharmacies, retail stores, or online. Drink -1 cup (120-240 mL) of oral rehydration solution each time you have an episode of diarrhea. If drinking this amount makes your diarrhea worse, try drinking smaller amounts more often. For example, drink 1-3 tsp (5-15 mL) every 5-10 minutes. A general rule for staying hydrated is to drink 1-2 L of  fluid per day. Talk to your health care provider about the specific amount you should be drinking each day. Drink enough fluids to keep your urine clear or pale yellow. This information is not intended to replace advice given to you by your health care provider. Make sure you discuss any questions you have with your health care provider. Document Released: 07/24/2003 Document Revised: 10/09/2015 Document Reviewed: 03/26/2013 Elsevier Interactive Patient Education  2017 [employer]  

## 2016-06-23 NOTE — Progress Notes (Signed)
Jennifer Davidson 61 y.o.   Chief Complaint  Patient presents with  . Diarrhea    x 5 days  . Emesis    HISTORY OF PRESENT ILLNESS: This is a 61 y.o. female complaining of 4 day h/o diarrhea with nausea and vomiting.  Diarrhea   This is a new problem. The current episode started in the past 7 days. The problem occurs 5 to 10 times per day. The problem has been gradually worsening. The stool consistency is described as watery. Associated symptoms include abdominal pain and vomiting. Pertinent negatives include no chills, coughing, fever, headaches or myalgias. Nothing aggravates the symptoms. Risk factors include suspect food intake (Tacos). She has tried increased fluids for the symptoms. The treatment provided no relief. Her past medical history is significant for irritable bowel syndrome. There is no history of bowel resection, inflammatory bowel disease, malabsorption, a recent abdominal surgery or short gut syndrome.  Emesis   Associated symptoms include abdominal pain and diarrhea. Pertinent negatives include no chest pain, chills, coughing, dizziness, fever, headaches or myalgias.     Prior to Admission medications   Medication Sig Start Date End Date Taking? Authorizing Provider  ALPRAZolam (XANAX XR) 1 MG 24 hr tablet Take 1 tablet (1 mg total) by mouth daily. 01/29/16  Yes [name], MD  ALPRAZolam Prudy Feeler) 1 MG tablet TAKE 1 TABLET TWICE DAILY AS NEEDED FOR ANXIETY 06/15/16  Yes [name], MD  Calcium-Magnesium-Vitamin D (CALCIUM 1200+D3 PO) Take 1 tablet by mouth daily. Contains 800iu Vitamin D3   Yes Historical Provider, MD  cholecalciferol (VITAMIN D) 1000 UNITS tablet Take 1 tablet (1,000 Units total) by mouth daily at 12 noon. 12/05/14  Yes Adonis Brook, NP  dicyclomine (BENTYL) 20 MG tablet TAKE 1 TABLET FOUR TIMES A DAY BEFORE MEALS AND AT BEDTIME AS NEEDED FOR CRAMPING IRRITABLE BOWELS 11/27/15  Yes [name], MD  diphenoxylate-atropine (LOMOTIL) 2.5-0.025 MG tablet TAKE 1  TABLET BY MOUTH 4 TIMES A DAY AS NEEDED FOR DIARRHEA 11/25/15  Yes [name], MD  DULoxetine (CYMBALTA) 60 MG capsule TAKE 1 CAPSULE (60 MG TOTAL) BY MOUTH DAILY. 03/26/16  Yes [name], MD  esomeprazole (NEXIUM) 40 MG capsule TAKE 1 CAPSULE (40 MG TOTAL) BY MOUTH DAILY. 01/29/16  Yes [name], MD  furosemide (LASIX) 40 MG tablet TAKE 1 TABLET EVERY DAY 06/03/16  Yes [name], MD  gabapentin (NEURONTIN) 300 MG capsule Take 2 capsules (600 mg total) by mouth 3 (three) times daily. 01/29/16  Yes [name], MD  ipratropium (ATROVENT) 0.03 % nasal spray Place 2 sprays into the nose 4 (four) times daily. 03/09/16  Yes [name], MD  levothyroxine (SYNTHROID, LEVOTHROID) 50 MCG tablet TAKE 1 TABLET EVERY DAY 06/20/16  Yes [name], MD  lidocaine (LIDODERM) 5 % PLACE 1 PATCH ONTO THE SKIN DAILY. REMOVE & DISCARD PATCH WITHIN 12 HOURS OR AS DIRECTED BY MD 04/12/15  Yes Historical Provider, MD  methocarbamol (ROBAXIN) 750 MG tablet TAKE 1 TABLET EVERY SIX HOURS AS NEEDED FOR MUSCLE SPASMS. 06/09/16  Yes Stephanie D English, PA  potassium chloride SA (K-DUR,KLOR-CON) 20 MEQ tablet TAKE 1 TABLET (20 MEQ TOTAL) BY MOUTH 2 (TWO) TIMES DAILY. 12/23/15  Yes [name], MD  promethazine (PHENERGAN) 25 MG tablet Take 1 tablet (25 mg total) by mouth every 8 (eight) hours as needed for nausea or vomiting. 04/23/16  Yes [name], MD  rizatriptan (MAXALT) 10 MG  tablet Take 0.5 tablets (5 mg total) by mouth as needed for migraine. May repeat in 2 hours if needed 01/29/16  Yes Sherren MochaEva N Shaw, MD  sucralfate (CARAFATE) 1 g tablet TAKE 1 TABLET FOUR TIMES DAILY WITH MEALS AND AT BEDTIME. 06/09/16  Yes Collie SiadStephanie D English, PA  topiramate (TOPAMAX) 100 MG tablet TAKE 1/2 TABLET BY MOUTH IN THE MORNING AND 1 TAB AT BEDTIME 01/29/16  Yes Sherren MochaEva N Shaw, MD  ciprofloxacin (CIPRO) 500 MG tablet Take 1 tablet (500 mg total) by mouth 2 (two) times daily. Patient not taking: Reported on 06/23/2016 04/23/16   Sherren MochaEva N Shaw, MD    Allergies    Allergen Reactions  . Tramadol Nausea And Vomiting  . Trazodone And Nefazodone Other (See Comments)    Per pt trazodone caused hallucinations and behavior changes     Patient Active Problem List   Diagnosis Date Noted  . GERD (gastroesophageal reflux disease) 11/13/2015  . Syncope 08/30/2015  . Generalized anxiety disorder 06/14/2015  . Chronic pain of right lower extremity 05/14/2015  . Neuropathy of lower extremity 05/14/2015  . Headache, migraine 01/17/2015  . Severe recurrent major depressive disorder with psychotic features (HCC)   . Major depression, recurrent, chronic (HCC) 12/02/2014  . Mood disorder (HCC) 12/01/2014  . Chronic back pain   . Hyperparathyroidism, 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  . Neuromuscular disorder (HCC) 07/31/2014  . Lower back pain 09/21/2013  . Diastolic heart failure (HCC) 03/17/2012  . Syncope and collapse 02/12/2012  . Hypertension 02/04/2012  . Chronic kidney disease 02/04/2012  . Edema 02/04/2012  . Tachycardia 02/04/2012  . Encounter for long-term (current) use of medications 02/04/2012  . Anxiety and depression 02/04/2012  . Hypothyroid 02/04/2012  . Hyperlipidemia 02/04/2012    Past Medical History:  Diagnosis Date  . Anxiety   . Cholesterol serum increased   . Chronic back pain   . Chronic kidney disease   . Depression   . Diastolic heart failure (HCC)   . GERD (gastroesophageal reflux disease)   . Hyperparathyroidism (HCC)   . Hypertension   . Hypothyroidism   . Migraine   . Neuromuscular disorder (HCC)   . Sleep apnea   . Tachycardia     Past Surgical History:  Procedure Laterality Date  . ABDOMINAL HYSTERECTOMY    . APPENDECTOMY    . BACK SURGERY    . KNEE SURGERY      Social History   Social History  . Marital status: Married    Spouse name: N/A  . Number of children: N/A  . Years of education: N/A   Occupational History  . Not on file.   Social History Main Topics  . Smoking status: Never Smoker   . Smokeless tobacco: Never Used  . Alcohol use No  . Drug use: No  . Sexual activity: Yes    Partners: Male     Comment: married   Other Topics Concern  . Not on file   Social History Narrative  . No narrative on file    Family History  Problem Relation Age of Onset  . Diabetes Mother   . Heart disease Mother   . Kidney disease Mother   . Thyroid disease Mother   . Heart disease Father   . Seizures Father   . COPD Father   . Cancer Maternal Grandmother      Review of Systems  Constitutional: Positive for malaise/fatigue. Negative for chills and fever.  HENT: Negative for nosebleeds, sinus pain  and sore throat.   Eyes: Negative for discharge and redness.  Respiratory: Negative for cough and shortness of breath.   Cardiovascular: Negative for chest pain, palpitations and leg swelling.  Gastrointestinal: Positive for abdominal pain, diarrhea, nausea and vomiting. Negative for blood in stool, heartburn and melena.  Genitourinary: Negative for dysuria and hematuria.  Musculoskeletal: Negative for myalgias.  Skin: Negative for rash.  Neurological: Positive for weakness. Negative for dizziness and headaches.  Endo/Heme/Allergies: Does not bruise/bleed easily.  All other systems reviewed and are negative.  Vitals:   06/23/16 0815  BP: 128/78  Pulse: 97  Resp: 18  Temp: 98.1 F (36.7 C)     Physical Exam  Constitutional: She is oriented to person, place, and time. She appears well-developed and well-nourished.  HENT:  Head: Normocephalic and atraumatic.  Nose: Nose normal.  Mouth/Throat: Oropharynx is clear and moist.  Slightly dry tongue.  Eyes: Conjunctivae and EOM are normal. Pupils are equal, round, and reactive to light.  Neck: Normal range of motion. Neck supple.  Cardiovascular: Normal rate, regular rhythm and normal heart sounds.   Pulmonary/Chest: Effort normal and breath sounds normal. She has no wheezes. She has no rales.  Abdominal: She exhibits no  distension and no mass. There is tenderness (diffuse). There is no rebound and no guarding.  Musculoskeletal: Normal range of motion.  Neurological: She is alert and oriented to person, place, and time. No sensory deficit. She exhibits normal muscle tone.  Skin: Skin is warm and dry. Capillary refill takes less than 2 seconds.  Psychiatric: She has a normal mood and affect. Her behavior is normal.  Vitals reviewed.  940am: Feels much better. CBC WNL.  ASSESSMENT & PLAN: Sharnika was seen today for diarrhea and emesis.  Diagnoses and all orders for this visit:  Diarrhea, unspecified type  Nausea and vomiting, intractability of vomiting not specified, unspecified vomiting type -     ondansetron (ZOFRAN-ODT) disintegrating tablet 4 mg; Take 1 tablet (4 mg total) by mouth once.  Acute gastroenteritis -     Comprehensive metabolic panel -     POCT CBC -     Discontinue: ondansetron (ZOFRAN) injection 4 mg; Inject 2 mLs (4 mg total) into the vein once. -     Care order/instruction:  Other orders -     ondansetron (ZOFRAN) 4 MG tablet; Take 1 tablet (4 mg total) by mouth every 8 (eight) hours as needed for nausea or vomiting.    Patient Instructions       IF you received an x-ray today, you will receive an invoice from [facility]. Please contact [facility] at [phone] with questions or concerns regarding your invoice.   IF you received labwork today, you will receive an invoice from [facility]. Please contact [facility] at [phone] with questions or concerns regarding your invoice.   Our billing staff will not be able to assist you with questions regarding bills from these companies.  You will be contacted with the lab results as soon as they are available. The fastest way to get your results is to activate your My Chart account. Instructions are located on the last page of this paperwork. If you have not heard from [place] regarding the results in 2 weeks, please  contact this office.      Food Choices to Help Relieve Diarrhea, Adult When you have diarrhea, the foods you eat and your eating habits are very important. Choosing the right foods and drinks can help relieve diarrhea. Also, because  diarrhea can last up to 7 days, you need to replace lost fluids and electrolytes (such as sodium, potassium, and chloride) in order to help prevent dehydration. What general guidelines do I need to follow?  Slowly drink 1 cup (8 oz) of fluid for each episode of diarrhea. If you are getting enough fluid, your urine will be clear or pale yellow.  Eat starchy foods. Some good choices include white rice, white toast, pasta, low-fiber cereal, baked potatoes (without the skin), saltine crackers, and bagels.  Avoid large servings of any cooked vegetables.  Limit fruit to two servings per day. A serving is  cup or 1 small piece.  Choose foods with less than 2 g of fiber per serving.  Limit fats to less than 8 tsp (38 g) per day.  Avoid fried foods.  Eat foods that have probiotics in them. Probiotics can be found in certain dairy products.  Avoid foods and beverages that may increase the speed at which food moves through the stomach and intestines (gastrointestinal tract). Things to avoid include:  High-fiber foods, such as dried fruit, raw fruits and vegetables, nuts, seeds, and whole grain foods.  Spicy foods and high-fat foods.  Foods and beverages sweetened with high-fructose corn syrup, honey, or sugar alcohols such as xylitol, sorbitol, and mannitol. What foods are recommended? Grains  White rice. White, [place], or pita breads (fresh or toasted), including plain rolls, buns, or bagels. White pasta. Saltine, soda, or graham crackers. Pretzels. Low-fiber cereal. Cooked cereals made with water (such as cornmeal, farina, or cream cereals). Plain muffins. Matzo. Melba toast. Zwieback. Vegetables  Potatoes (without the skin). Strained tomato and vegetable  juices. Most well-cooked and canned vegetables without seeds. Tender lettuce. Fruits  Cooked or canned applesauce, apricots, cherries, fruit cocktail, grapefruit, peaches, pears, or plums. Fresh bananas, apples without skin, cherries, grapes, cantaloupe, grapefruit, peaches, oranges, or plums. Meat and Other Protein Products  Baked or boiled chicken. Eggs. Tofu. Fish. Seafood. Smooth peanut butter. Ground or well-cooked tender beef, ham, veal, lamb, pork, or poultry. Dairy  Plain yogurt, kefir, and unsweetened liquid yogurt. Lactose-free milk, buttermilk, or soy milk. Plain hard cheese. Beverages  Sport drinks. Clear broths. Diluted fruit juices (except prune). Regular, caffeine-free sodas such as ginger ale. Water. Decaffeinated teas. Oral rehydration solutions. Sugar-free beverages not sweetened with sugar alcohols. Other  Bouillon, broth, or soups made from recommended foods. The items listed above may not be a complete list of recommended foods or beverages. Contact your dietitian for more options.  What foods are not recommended? Grains  Whole grain, whole wheat, bran, or rye breads, rolls, pastas, crackers, and cereals. Wild or brown rice. Cereals that contain more than 2 g of fiber per serving. Corn tortillas or taco shells. Cooked or dry oatmeal. Granola. Popcorn. Vegetables  Raw vegetables. Cabbage, broccoli, Brussels sprouts, artichokes, baked beans, beet greens, corn, kale, legumes, peas, sweet potatoes, and yams. Potato skins. Cooked spinach and cabbage. Fruits  Dried fruit, including raisins and dates. Raw fruits. Stewed or dried prunes. Fresh apples with skin, apricots, mangoes, pears, raspberries, and strawberries. Meat and Other Protein Products  Chunky peanut butter. Nuts and seeds. Beans and lentils. Tomasa Blase. Dairy  High-fat cheeses. Milk, chocolate milk, and beverages made with milk, such as milk shakes. Cream. Ice cream. Sweets and Desserts  Sweet rolls, doughnuts, and sweet  breads. Pancakes and waffles. Fats and Oils  Butter. Cream sauces. Margarine. Salad oils. Plain salad dressings. Olives. Avocados. Beverages  Caffeinated beverages (such as coffee, tea,  soda, or energy drinks). Alcoholic beverages. Fruit juices with pulp. Prune juice. Soft drinks sweetened with high-fructose corn syrup or sugar alcohols. Other  Coconut. Hot sauce. Chili powder. Mayonnaise. Gravy. Cream-based or milk-based soups. The items listed above may not be a complete list of foods and beverages to avoid. Contact your dietitian for more information.  What should I do if I become dehydrated? Diarrhea can sometimes lead to dehydration. Signs of dehydration include dark urine and dry mouth and skin. If you think you are dehydrated, you should rehydrate with an oral rehydration solution. These solutions can be purchased at pharmacies, retail stores, or online. Drink -1 cup (120-240 mL) of oral rehydration solution each time you have an episode of diarrhea. If drinking this amount makes your diarrhea worse, try drinking smaller amounts more often. For example, drink 1-3 tsp (5-15 mL) every 5-10 minutes. A general rule for staying hydrated is to drink 1-2 L of fluid per day. Talk to your health care provider about the specific amount you should be drinking each day. Drink enough fluids to keep your urine clear or pale yellow. This information is not intended to replace advice given to you by your health care provider. Make sure you discuss any questions you have with your health care provider. Document Released: 07/24/2003 Document Revised: 10/09/2015 Document Reviewed: 03/26/2013 Elsevier Interactive Patient Education  2017 Elsevier Inc.      Edwina Barth, MD [facility]

## 2016-06-24 ENCOUNTER — Observation Stay (HOSPITAL_COMMUNITY)
Admission: AD | Admit: 2016-06-24 | Discharge: 2016-06-26 | Disposition: A | Discharge status: Home / Self Care | Payer: Medicare HMO | Source: Ambulatory Visit | Attending: Family Medicine | Admitting: Family Medicine

## 2016-06-24 ENCOUNTER — Encounter: Payer: Self-pay | Admitting: Family Medicine

## 2016-06-24 ENCOUNTER — Telehealth: Payer: Self-pay | Admitting: Family Medicine

## 2016-06-24 ENCOUNTER — Ambulatory Visit (INDEPENDENT_AMBULATORY_CARE_PROVIDER_SITE_OTHER): Payer: Medicare HMO | Admitting: Family Medicine

## 2016-06-24 VITALS — BP 112/71 | HR 100 | Temp 98.2°F | Resp 18 | Ht 62.5 in

## 2016-06-24 DIAGNOSIS — F329 Major depressive disorder, single episode, unspecified: Secondary | ICD-10-CM

## 2016-06-24 DIAGNOSIS — I5030 Unspecified diastolic (congestive) heart failure: Secondary | ICD-10-CM | POA: Diagnosis present

## 2016-06-24 DIAGNOSIS — N390 Urinary tract infection, site not specified: Secondary | ICD-10-CM

## 2016-06-24 DIAGNOSIS — M5441 Lumbago with sciatica, right side: Secondary | ICD-10-CM

## 2016-06-24 DIAGNOSIS — N1832 Chronic kidney disease, stage 3b: Secondary | ICD-10-CM | POA: Diagnosis present

## 2016-06-24 DIAGNOSIS — R51 Headache: Secondary | ICD-10-CM

## 2016-06-24 DIAGNOSIS — E039 Hypothyroidism, unspecified: Secondary | ICD-10-CM | POA: Insufficient documentation

## 2016-06-24 DIAGNOSIS — N3001 Acute cystitis with hematuria: Secondary | ICD-10-CM | POA: Diagnosis not present

## 2016-06-24 DIAGNOSIS — R197 Diarrhea, unspecified: Secondary | ICD-10-CM

## 2016-06-24 DIAGNOSIS — N183 Chronic kidney disease, stage 3 unspecified: Secondary | ICD-10-CM

## 2016-06-24 DIAGNOSIS — R410 Disorientation, unspecified: Secondary | ICD-10-CM | POA: Diagnosis not present

## 2016-06-24 DIAGNOSIS — G8929 Other chronic pain: Secondary | ICD-10-CM

## 2016-06-24 DIAGNOSIS — I130 Hypertensive heart and chronic kidney disease with heart failure and stage 1 through stage 4 chronic kidney disease, or unspecified chronic kidney disease: Secondary | ICD-10-CM | POA: Diagnosis not present

## 2016-06-24 DIAGNOSIS — F5105 Insomnia due to other mental disorder: Secondary | ICD-10-CM

## 2016-06-24 DIAGNOSIS — Z79899 Other long term (current) drug therapy: Secondary | ICD-10-CM

## 2016-06-24 DIAGNOSIS — G9340 Encephalopathy, unspecified: Principal | ICD-10-CM

## 2016-06-24 DIAGNOSIS — F333 Major depressive disorder, recurrent, severe with psychotic symptoms: Secondary | ICD-10-CM | POA: Diagnosis not present

## 2016-06-24 DIAGNOSIS — R519 Headache, unspecified: Secondary | ICD-10-CM

## 2016-06-24 DIAGNOSIS — K529 Noninfective gastroenteritis and colitis, unspecified: Secondary | ICD-10-CM | POA: Diagnosis not present

## 2016-06-24 DIAGNOSIS — F419 Anxiety disorder, unspecified: Secondary | ICD-10-CM

## 2016-06-24 DIAGNOSIS — M25561 Pain in right knee: Secondary | ICD-10-CM | POA: Diagnosis not present

## 2016-06-24 DIAGNOSIS — F418 Other specified anxiety disorders: Secondary | ICD-10-CM

## 2016-06-24 DIAGNOSIS — F99 Mental disorder, not otherwise specified: Secondary | ICD-10-CM

## 2016-06-24 DIAGNOSIS — N2581 Secondary hyperparathyroidism of renal origin: Secondary | ICD-10-CM

## 2016-06-24 DIAGNOSIS — G5791 Unspecified mononeuropathy of right lower limb: Secondary | ICD-10-CM

## 2016-06-24 DIAGNOSIS — N189 Chronic kidney disease, unspecified: Secondary | ICD-10-CM | POA: Diagnosis present

## 2016-06-24 DIAGNOSIS — I10 Essential (primary) hypertension: Secondary | ICD-10-CM | POA: Diagnosis present

## 2016-06-24 LAB — POCT URINALYSIS DIP (MANUAL ENTRY)
BILIRUBIN UA: NEGATIVE
Bilirubin, UA: NEGATIVE
Nitrite, UA: POSITIVE — AB
PROTEIN UA: NEGATIVE
RBC UA: NEGATIVE
SPEC GRAV UA: 1.01
Urobilinogen, UA: 0.2
pH, UA: 5

## 2016-06-24 LAB — COMPREHENSIVE METABOLIC PANEL
ALT: 32 IU/L (ref 0–32)
AST: 33 IU/L (ref 0–40)
Albumin/Globulin Ratio: 1.5 (ref 1.2–2.2)
Albumin: 4.1 g/dL (ref 3.6–4.8)
Alkaline Phosphatase: 104 IU/L (ref 39–117)
BUN / CREAT RATIO: 9 — AB (ref 12–28)
BUN: 11 mg/dL (ref 8–27)
Bilirubin Total: 0.3 mg/dL (ref 0.0–1.2)
CALCIUM: 9.5 mg/dL (ref 8.7–10.3)
CO2: 21 mmol/L (ref 18–29)
Chloride: 104 mmol/L (ref 96–106)
Creatinine, Ser: 1.28 mg/dL — ABNORMAL HIGH (ref 0.57–1.00)
GFR, EST AFRICAN AMERICAN: 52 mL/min/{1.73_m2} — AB (ref >59)
GFR, EST NON AFRICAN AMERICAN: 45 mL/min/{1.73_m2} — AB (ref >59)
GLUCOSE: 114 mg/dL — AB (ref 65–99)
Globulin, Total: 2.7 g/dL (ref 1.5–4.5)
Potassium: 4.2 mmol/L (ref 3.5–5.2)
Sodium: 140 mmol/L (ref 134–144)
TOTAL PROTEIN: 6.8 g/dL (ref 6.0–8.5)

## 2016-06-24 LAB — POCT CBC
GRANULOCYTE PERCENT: 61 % (ref 37–80)
HEMATOCRIT: 33.3 % — AB (ref 37.7–47.9)
Hemoglobin: 11.5 g/dL — AB (ref 12.2–16.2)
LYMPH, POC: 3.2 (ref 0.6–3.4)
MCH, POC: 28.2 pg (ref 27–31.2)
MCHC: 34.5 g/dL (ref 31.8–35.4)
MCV: 81.9 fL (ref 80–97)
MID (CBC): 0.6 (ref 0–0.9)
MPV: 6.6 fL (ref 0–99.8)
POC GRANULOCYTE: 5.9 (ref 2–6.9)
POC LYMPH %: 33.3 % (ref 10–50)
POC MID %: 5.7 % (ref 0–12)
Platelet Count, POC: 260 10*3/uL (ref 142–424)
RBC: 4.06 M/uL (ref 4.04–5.48)
RDW, POC: 15.6 %
WBC: 9.7 10*3/uL (ref 4.6–10.2)

## 2016-06-24 LAB — GLUCOSE, POCT (MANUAL RESULT ENTRY): POC GLUCOSE: 93 mg/dL (ref 70–99)

## 2016-06-24 MED ORDER — CEPHALEXIN 500 MG PO CAPS: 500.0000 mg | ORAL_CAPSULE | Freq: Two times a day (BID) | ORAL | 0 refills | Status: DC

## 2016-06-24 MED ORDER — HYDROXYZINE HCL 50 MG PO TABS
100.0000 mg | ORAL_TABLET | Freq: Every day | ORAL | 0 refills | Status: DC
Start: 2016-06-24 — End: 2016-12-09

## 2016-06-24 MED ORDER — DIPHENOXYLATE-ATROPINE 2.5-0.025 MG PO TABS: ORAL_TABLET | ORAL | 2 refills | Status: DC

## 2016-06-24 MED ORDER — DICLOFENAC SODIUM 3 % TD GEL: 1.0000 "application " | Freq: Three times a day (TID) | TRANSDERMAL | 2 refills | Status: DC

## 2016-06-24 MED ORDER — KETOROLAC TROMETHAMINE 30 MG/ML IJ SOLN
30.0000 mg | Freq: Once | INTRAMUSCULAR | Status: AC
  Administered 2016-06-24: 30 mg via INTRAMUSCULAR

## 2016-06-24 MED ORDER — CEFTRIAXONE SODIUM 1 G IJ SOLR
1.0000 g | Freq: Once | INTRAMUSCULAR | Status: AC
  Administered 2016-06-24: 1 g via INTRAMUSCULAR

## 2016-06-24 MED ORDER — PROMETHAZINE HCL 25 MG/ML IJ SOLN
25.0000 mg | Freq: Once | INTRAMUSCULAR | Status: AC
  Administered 2016-06-24: 25 mg via INTRAMUSCULAR

## 2016-06-24 NOTE — Progress Notes (Signed)
FO

## 2016-06-24 NOTE — Patient Instructions (Addendum)
Start the cephalexin tomorrow. The diclofenac gel will help the knee pain so you can take less hydrocodone. Take the hydroxyzine before bed at night - use the white noise machine/phone app and turn off screens - you may read something boring.    IF you received an x-ray today, you will receive an invoice from [facility]. Please contact [facility] at [phone] with questions or concerns regarding your invoice.   IF you received labwork today, you will receive an invoice from [facility]. Please contact [facility] at [phone] with questions or concerns regarding your invoice.   Our billing staff will not be able to assist you with questions regarding bills from these companies.  You will be contacted with the lab results as soon as they are available. The fastest way to get your results is to activate your My Chart account. Instructions are located on the last page of this paperwork. If you have not heard from us regarding the results in 2 weeks, please contact this office.     [facility]admitting [facility] closed) so they can "arrive" you.  You do NOT need to see an ER doctor but do go to the check-in so they can register you and get you sent up to your room on [address].  You will be cared for by the [facility] - I talked to Dr. Michael LitterNikki Carter - but when you arrive the nurse will contact the hospitalist flow manager to have whatever doctor is next available to come see you.

## 2016-06-24 NOTE — Telephone Encounter (Signed)
Patient's husband called. She's been taken off all of her medication for her knee surgery. She has restarted it but some of it might not have got in because of her vomiting and diarrhea. He states she is exactly the same as when she was off her medication before and so thinks patient just needs more time to get medication in her system but he is worried he might not be giving it to her correctly or they might be missing a pill. She is not making sense all the time and somewhat hallucinating but is not a danger to herself or others. They called her psychologist who advised potential admission to behavioral health but Aymara and her husband were hoping she could just [facility] as she does not need her psych medicines changed she only needs them in her system. She was seen here yesterday for dehydration and given IV fluids. Since then she had 2 stools yesterday and one stool today so her diarrhea has resolved and she is drinking and eating. Offered appointment with me in 2 hours. Will bring medications.
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
## 2016-06-25 ENCOUNTER — Encounter (HOSPITAL_COMMUNITY): Payer: Self-pay

## 2016-06-25 DIAGNOSIS — E039 Hypothyroidism, unspecified: Secondary | ICD-10-CM | POA: Diagnosis not present

## 2016-06-25 DIAGNOSIS — Z888 Allergy status to other drugs, medicaments and biological substances status: Secondary | ICD-10-CM

## 2016-06-25 DIAGNOSIS — N3000 Acute cystitis without hematuria: Secondary | ICD-10-CM

## 2016-06-25 DIAGNOSIS — I130 Hypertensive heart and chronic kidney disease with heart failure and stage 1 through stage 4 chronic kidney disease, or unspecified chronic kidney disease: Secondary | ICD-10-CM | POA: Diagnosis not present

## 2016-06-25 DIAGNOSIS — F419 Anxiety disorder, unspecified: Secondary | ICD-10-CM

## 2016-06-25 DIAGNOSIS — N189 Chronic kidney disease, unspecified: Secondary | ICD-10-CM | POA: Diagnosis not present

## 2016-06-25 DIAGNOSIS — N390 Urinary tract infection, site not specified: Secondary | ICD-10-CM

## 2016-06-25 DIAGNOSIS — F333 Major depressive disorder, recurrent, severe with psychotic symptoms: Secondary | ICD-10-CM

## 2016-06-25 DIAGNOSIS — G9340 Encephalopathy, unspecified: Secondary | ICD-10-CM

## 2016-06-25 DIAGNOSIS — I5030 Unspecified diastolic (congestive) heart failure: Secondary | ICD-10-CM | POA: Diagnosis not present

## 2016-06-25 DIAGNOSIS — K529 Noninfective gastroenteritis and colitis, unspecified: Secondary | ICD-10-CM

## 2016-06-25 DIAGNOSIS — M25561 Pain in right knee: Secondary | ICD-10-CM | POA: Diagnosis not present

## 2016-06-25 DIAGNOSIS — I5032 Chronic diastolic (congestive) heart failure: Secondary | ICD-10-CM

## 2016-06-25 DIAGNOSIS — N183 Chronic kidney disease, stage 3 (moderate): Secondary | ICD-10-CM

## 2016-06-25 DIAGNOSIS — Z79899 Other long term (current) drug therapy: Secondary | ICD-10-CM | POA: Diagnosis not present

## 2016-06-25 DIAGNOSIS — I10 Essential (primary) hypertension: Secondary | ICD-10-CM

## 2016-06-25 HISTORY — DX: Encephalopathy, unspecified: G93.40

## 2016-06-25 HISTORY — DX: Major depressive disorder, recurrent, severe with psychotic symptoms: F33.3

## 2016-06-25 HISTORY — DX: Urinary tract infection, site not specified: N39.0

## 2016-06-25 LAB — COMPREHENSIVE METABOLIC PANEL
A/G RATIO: 1.6 (ref 1.2–2.2)
ALBUMIN: 3.9 g/dL (ref 3.6–4.8)
ALBUMIN: 3.4 g/dL — AB (ref 3.5–5.0)
ALK PHOS: 69 U/L (ref 38–126)
ALT: 32 IU/L (ref 0–32)
ALT: 32 U/L (ref 14–54)
AST: 38 IU/L (ref 0–40)
AST: 33 U/L (ref 15–41)
Alkaline Phosphatase: 93 IU/L (ref 39–117)
Anion gap: 5 (ref 5–15)
BILIRUBIN TOTAL: 0.3 mg/dL (ref 0.0–1.2)
BILIRUBIN TOTAL: 0.4 mg/dL (ref 0.3–1.2)
BUN / CREAT RATIO: 6 — AB (ref 12–28)
BUN: 8 mg/dL (ref 8–27)
BUN: 7 mg/dL (ref 6–20)
CALCIUM: 8.6 mg/dL — AB (ref 8.9–10.3)
CHLORIDE: 106 mmol/L (ref 96–106)
CO2: 21 mmol/L (ref 18–29)
CO2: 22 mmol/L (ref 22–32)
CREATININE: 1.28 mg/dL — AB (ref 0.44–1.00)
Calcium: 9 mg/dL (ref 8.7–10.3)
Chloride: 113 mmol/L — ABNORMAL HIGH (ref 101–111)
Creatinine, Ser: 1.34 mg/dL — ABNORMAL HIGH (ref 0.57–1.00)
GFR calc non Af Amer: 42 mL/min/{1.73_m2} — ABNORMAL LOW (ref >59)
GFR calc non Af Amer: 44 mL/min — ABNORMAL LOW (ref >60)
GFR, EST AFRICAN AMERICAN: 49 mL/min/{1.73_m2} — AB (ref >59)
GFR, EST AFRICAN AMERICAN: 51 mL/min — AB (ref >60)
GLUCOSE: 100 mg/dL — AB (ref 65–99)
Globulin, Total: 2.4 g/dL (ref 1.5–4.5)
Glucose: 96 mg/dL (ref 65–99)
POTASSIUM: 4.4 mmol/L (ref 3.5–5.2)
Potassium: 3.8 mmol/L (ref 3.5–5.1)
SODIUM: 140 mmol/L (ref 134–144)
Sodium: 140 mmol/L (ref 135–145)
TOTAL PROTEIN: 6.3 g/dL (ref 6.0–8.5)
TOTAL PROTEIN: 6.3 g/dL — AB (ref 6.5–8.1)

## 2016-06-25 LAB — CBC WITH DIFFERENTIAL/PLATELET
Basophils Absolute: 0 10*3/uL (ref 0.0–0.1)
Basophils Relative: 0 %
EOS ABS: 0.4 10*3/uL (ref 0.0–0.7)
Eosinophils Relative: 4 %
HEMATOCRIT: 31.5 % — AB (ref 36.0–46.0)
HEMOGLOBIN: 10.1 g/dL — AB (ref 12.0–15.0)
LYMPHS ABS: 3.4 10*3/uL (ref 0.7–4.0)
LYMPHS PCT: 36 %
MCH: 27.4 pg (ref 26.0–34.0)
MCHC: 32.1 g/dL (ref 30.0–36.0)
MCV: 85.4 fL (ref 78.0–100.0)
Monocytes Absolute: 0.6 10*3/uL (ref 0.1–1.0)
Monocytes Relative: 6 %
NEUTROS ABS: 5.1 10*3/uL (ref 1.7–7.7)
NEUTROS PCT: 54 %
Platelets: 232 10*3/uL (ref 150–400)
RBC: 3.69 MIL/uL — AB (ref 3.87–5.11)
RDW: 15.5 % (ref 11.5–15.5)
WBC: 9.5 10*3/uL (ref 4.0–10.5)

## 2016-06-25 LAB — TSH
TSH: 1.2 u[IU]/mL (ref 0.450–4.500)
TSH: 0.851 u[IU]/mL (ref 0.350–4.500)

## 2016-06-25 MED ORDER — DICLOFENAC SODIUM 1 % TD GEL
2.0000 g | Freq: Three times a day (TID) | TRANSDERMAL | Status: DC
  Administered 2016-06-25 – 2016-06-26 (×4): 2 g via TOPICAL
  Filled 2016-06-25: qty 100

## 2016-06-25 MED ORDER — TOPIRAMATE 100 MG PO TABS
100.0000 mg | ORAL_TABLET | Freq: Every day | ORAL | Status: DC
  Administered 2016-06-25 (×2): 100 mg via ORAL
  Filled 2016-06-25: qty 1

## 2016-06-25 MED ORDER — GABAPENTIN 400 MG PO CAPS
400.0000 mg | ORAL_CAPSULE | Freq: Three times a day (TID) | ORAL | Status: DC
  Administered 2016-06-25 – 2016-06-26 (×3): 400 mg via ORAL
  Filled 2016-06-25 (×3): qty 1

## 2016-06-25 MED ORDER — TOPIRAMATE 25 MG PO TABS
50.0000 mg | ORAL_TABLET | Freq: Every morning | ORAL | Status: DC
  Administered 2016-06-25 – 2016-06-26 (×3): 50 mg via ORAL
  Filled 2016-06-25 (×4): qty 2

## 2016-06-25 MED ORDER — QUETIAPINE FUMARATE 50 MG PO TABS
25.0000 mg | ORAL_TABLET | Freq: Every day | ORAL | Status: DC
  Administered 2016-06-25: 25 mg via ORAL
  Filled 2016-06-25: qty 1

## 2016-06-25 MED ORDER — SUCRALFATE 1 G PO TABS
1.0000 g | ORAL_TABLET | Freq: Three times a day (TID) | ORAL | Status: DC
  Administered 2016-06-25 – 2016-06-26 (×5): 1 g via ORAL
  Filled 2016-06-25 (×5): qty 1

## 2016-06-25 MED ORDER — ALPRAZOLAM 1 MG PO TABS: 1.0000 mg | ORAL_TABLET | Freq: Two times a day (BID) | ORAL | Status: DC | PRN

## 2016-06-25 MED ORDER — PANTOPRAZOLE SODIUM 40 MG PO TBEC
40.0000 mg | DELAYED_RELEASE_TABLET | Freq: Every day | ORAL | Status: DC
  Administered 2016-06-25 – 2016-06-26 (×2): 40 mg via ORAL
  Filled 2016-06-25: qty 1

## 2016-06-25 MED ORDER — GABAPENTIN 300 MG PO CAPS
600.0000 mg | ORAL_CAPSULE | Freq: Three times a day (TID) | ORAL | Status: DC
  Administered 2016-06-25: 600 mg via ORAL
  Filled 2016-06-25: qty 2

## 2016-06-25 MED ORDER — ONDANSETRON HCL 4 MG PO TABS: 4.0000 mg | ORAL_TABLET | Freq: Four times a day (QID) | ORAL | Status: DC | PRN

## 2016-06-25 MED ORDER — LEVOTHYROXINE SODIUM 50 MCG PO TABS
50.0000 ug | ORAL_TABLET | Freq: Every day | ORAL | Status: DC
  Administered 2016-06-25 – 2016-06-26 (×2): 50 ug via ORAL
  Filled 2016-06-25 (×2): qty 1

## 2016-06-25 MED ORDER — ONDANSETRON HCL 4 MG/2ML IJ SOLN: 4.0000 mg | Freq: Four times a day (QID) | INTRAMUSCULAR | Status: DC | PRN

## 2016-06-25 MED ORDER — SODIUM CHLORIDE 0.9 % IV SOLN
INTRAVENOUS | Status: AC
  Administered 2016-06-25: 02:00:00 via INTRAVENOUS

## 2016-06-25 MED ORDER — ACETAMINOPHEN 650 MG RE SUPP: 650.0000 mg | Freq: Four times a day (QID) | RECTAL | Status: DC | PRN

## 2016-06-25 MED ORDER — HYDROXYZINE HCL 25 MG PO TABS
100.0000 mg | ORAL_TABLET | Freq: Every day | ORAL | Status: DC
  Administered 2016-06-25 (×2): 100 mg via ORAL
  Filled 2016-06-25 (×3): qty 4

## 2016-06-25 MED ORDER — ACETAMINOPHEN 325 MG PO TABS
650.0000 mg | ORAL_TABLET | Freq: Four times a day (QID) | ORAL | Status: DC | PRN
  Administered 2016-06-26: 650 mg via ORAL
  Filled 2016-06-25: qty 2

## 2016-06-25 MED ORDER — ALPRAZOLAM ER 1 MG PO TB24
1.0000 mg | ORAL_TABLET | Freq: Every day | ORAL | Status: DC
  Administered 2016-06-25 – 2016-06-26 (×2): 1 mg via ORAL
  Filled 2016-06-25 (×2): qty 1

## 2016-06-25 MED ORDER — CEFTRIAXONE SODIUM 1 G IJ SOLR
1.0000 g | INTRAMUSCULAR | Status: DC
  Administered 2016-06-25: 1 g via INTRAVENOUS
  Filled 2016-06-25 (×2): qty 10

## 2016-06-25 MED ORDER — DULOXETINE HCL 60 MG PO CPEP
60.0000 mg | ORAL_CAPSULE | Freq: Every day | ORAL | Status: DC
  Administered 2016-06-25 – 2016-06-26 (×2): 60 mg via ORAL
  Filled 2016-06-25 (×2): qty 1

## 2016-06-25 NOTE — Consult Note (Signed)
[facility] Consult   Reason for Consult:  Delirium with hallucinations Referring Physician:  Dr. Lonny Prude Patient Identification: Jennifer Davidson MRN:  [id number] Principal Diagnosis: Acute encephalopathy Diagnosis:   Patient Active Problem List   Diagnosis Date Noted  . UTI (urinary tract infection) [N39.0] 06/25/2016  . Acute encephalopathy [G93.40] 06/25/2016  . Severe recurrent depression with psychosis ([place]) [F33.3] 06/25/2016  . Acute gastroenteritis [K52.9] 06/23/2016  . GERD (gastroesophageal reflux disease) [K21.9] 11/13/2015  . Syncope [R55] 08/30/2015  . Generalized anxiety disorder [F41.1] 06/14/2015  . Chronic pain of right lower extremity [M79.604, G89.29] 05/14/2015  . Neuropathy of lower extremity [G57.90] 05/14/2015  . Headache, migraine [G43.909] 01/17/2015  . Severe recurrent major depressive disorder with psychotic features ([place]) [F33.3]   . Major depression, recurrent, chronic ([place]) [F33.9] 12/02/2014  . Mood disorder ([place]) [F39] 12/01/2014  . Chronic back pain [M54.9, G89.29]   . Hyperparathyroidism, secondary renal ([place]) [N25.81] 07/31/2014  . Neuromuscular disorder ([place]) [G70.9] 07/31/2014  . Lower back pain [M54.5] 09/21/2013  . Diastolic heart failure ([place]) [I50.30] 03/17/2012  . Syncope and collapse [R55] 02/12/2012  . Hypertension [I10] 02/04/2012  . Chronic kidney disease [N18.9] 02/04/2012  . Edema [R60.9] 02/04/2012  . Tachycardia [R00.0] 02/04/2012  . Encounter for long-term (current) use of medications [Z79.899] 02/04/2012  . Anxiety and depression [F41.8] 02/04/2012  . Hypothyroid [E03.9] 02/04/2012  . Hyperlipidemia [E78.5] 02/04/2012    Total Time spent with patient: 1 hour  Subjective:   Jennifer Davidson is a 61 y.o. female patient admitted with Delirium and auditory/visual hallucinations.  HPI:  Jennifer Davidson is a 61 y.o. married woman, seen, chart reviewed and case discussed with patient in unit LCSW. Patient reported recently  she had surgical intervention on her right knee since then she has been receiving pain medication and also recently diagnosed with urinary tract infection and been treated with antibiotics as outpatient. Patient was admitted to the [facility] secondary to increased symptoms of delirium, auditory/visual hallucinations. Patient and her husband stated that she has been talking with the people who were not there and also seeing her parents who were deceased a long time ago. Patient is also found talking about work even though she was retired more than 10 years ago. Patient has history of major depressive disorder with psychosis. Patient has been receiving outpatient medication management but denied acute psychiatric hospitalization. Patient denied current suicidal/homicidal ideation, intention or plans.  Medical history: Patient  with a history of chronic diastolic heart failure (compensated), HTN, hypothyroidism, depression with psychotic features, chronic pain, IBS with diarrhea, and recurrent UTIs who is referred from her PCP's office for direct admission for further evaluation and management of delirium that have been present since a right knee surgery on 1/31.  The patient had to stop all of her medications prior to surgery (some meds held 5 days prior), but she says they were resumed the day after surgery (2/1).  Despite this, her husband has witnessed atypical behaviors and hallucinations (referring to relatives who are not present, referring to work though she has been retired for 10+ years).  She has been evaluated in the office the past two days.  She has evidence of a UTI (U/A nitrite positive with large leukocytes).  She received IV Rocephin in the office prior to arrival here.  Her PCP notes that she has had similar presentations in the past with UTI and with narcotic use.  The patient has been on hydrocodone since her  knee surgery.  At the time of my evaluation, the patient is calm  and appropriate, alert and oriented x 4.  She denies dysuria, frequency, or urine odor.  She has had LHD and reports that she has passed out at home.  No chest pain.  She has subacute, intermittent shortness of breath, present even prior to surgery.  She rates her knee pain a 4 out of 10 at this time.  Past Psychiatric History: Depression and anxiety, has been receiving outpatient vacation management, patient lost inpatient psychiatric hospitalization was 2016 for worsening depression secondary to chronic pain syndrome and also suicidal thoughts..   Risk to Self:   Risk to Others:   Prior Inpatient Therapy:   Prior Outpatient Therapy:    Past Medical History:  Past Medical History:  Diagnosis Date  . Anxiety   . Cholesterol serum increased   . Chronic back pain   . Chronic kidney disease   . Depression   . Diastolic heart failure ([place])   . GERD (gastroesophageal reflux disease)   . Hyperparathyroidism ([place])   . Hypertension   . Hypothyroidism   . Migraine   . Neuromuscular disorder ([place])   . Sleep apnea   . Tachycardia     Past Surgical History:  Procedure Laterality Date  . ABDOMINAL HYSTERECTOMY    . APPENDECTOMY    . BACK SURGERY    . KNEE SURGERY     Family History:  Family History  Problem Relation Age of Onset  . Diabetes Mother   . Heart disease Mother   . Kidney disease Mother   . Thyroid disease Mother   . Heart disease Father   . Seizures Father   . COPD Father   . Cancer Maternal Grandmother    Family Psychiatric  History: Noncontributory  Social History:  History  Alcohol Use No     History  Drug Use No    Social History   Social History  . Marital status: Married    Spouse name: N/A  . Number of children: N/A  . Years of education: N/A   Social History Main Topics  . Smoking status: Never Smoker  . Smokeless tobacco: Never Used  . Alcohol use No  . Drug use: No  . Sexual activity: Yes    Partners: Male     Comment: married    Other Topics Concern  . Not on file   Social History Narrative  . No narrative on file   Additional Social History:    Allergies:   Allergies  Allergen Reactions  . Tramadol Nausea And Vomiting  . Trazodone And Nefazodone Other (See Comments)    Per pt trazodone caused hallucinations and behavior changes     Labs:  Results for orders placed or performed during the hospital encounter of 06/24/16 (from the past 48 hour(s))  CBC WITH DIFFERENTIAL     Status: Abnormal   Collection Time: 06/25/16  1:16 AM  Result Value Ref Range   WBC 9.5 4.0 - 10.5 K/uL   RBC 3.69 (L) 3.87 - 5.11 MIL/uL   Hemoglobin 10.1 (L) 12.0 - 15.0 g/dL   HCT 31.5 (L) 36.0 - 46.0 %   MCV 85.4 78.0 - 100.0 fL   MCH 27.4 26.0 - 34.0 pg   MCHC 32.1 30.0 - 36.0 g/dL   RDW 15.5 11.5 - 15.5 %   Platelets 232 150 - 400 K/uL   Neutrophils Relative % 54 %   Neutro Abs 5.1 1.7 -  7.7 K/uL   Lymphocytes Relative 36 %   Lymphs Abs 3.4 0.7 - 4.0 K/uL   Monocytes Relative 6 %   Monocytes Absolute 0.6 0.1 - 1.0 K/uL   Eosinophils Relative 4 %   Eosinophils Absolute 0.4 0.0 - 0.7 K/uL   Basophils Relative 0 %   Basophils Absolute 0.0 0.0 - 0.1 K/uL  Comprehensive metabolic panel     Status: Abnormal   Collection Time: 06/25/16  1:16 AM  Result Value Ref Range   Sodium 140 135 - 145 mmol/L   Potassium 3.8 3.5 - 5.1 mmol/L   Chloride 113 (H) 101 - 111 mmol/L   CO2 22 22 - 32 mmol/L   Glucose, Bld 100 (H) 65 - 99 mg/dL   BUN 7 6 - 20 mg/dL   Creatinine, Ser 1.28 (H) 0.44 - 1.00 mg/dL   Calcium 8.6 (L) 8.9 - 10.3 mg/dL   Total Protein 6.3 (L) 6.5 - 8.1 g/dL   Albumin 3.4 (L) 3.5 - 5.0 g/dL   AST 33 15 - 41 U/L   ALT 32 14 - 54 U/L   Alkaline Phosphatase 69 38 - 126 U/L   Total Bilirubin 0.4 0.3 - 1.2 mg/dL   GFR calc non Af Amer 44 (L) >60 mL/min   GFR calc Af Amer 51 (L) >60 mL/min    Comment: (NOTE) The eGFR has been calculated using the CKD EPI equation. This calculation has not been validated in all  clinical situations. eGFR's persistently <60 mL/min signify possible Chronic Kidney Disease.    Anion gap 5 5 - 15  TSH     Status: None   Collection Time: 06/25/16  1:16 AM  Result Value Ref Range   TSH 0.851 0.350 - 4.500 uIU/mL    Comment: Performed by a 3rd Generation assay with a functional sensitivity of <=0.01 uIU/mL.    Current Facility-Administered Medications  Medication Dose Route Frequency Provider Last Rate Last Dose  . 0.9 %  sodium chloride infusion   Intravenous Continuous Lily Kocher, MD 75 mL/hr at 06/25/16 0136    . acetaminophen (TYLENOL) tablet 650 mg  650 mg Oral Q6H PRN Lily Kocher, MD       Or  . acetaminophen (TYLENOL) suppository 650 mg  650 mg Rectal Q6H PRN Lily Kocher, MD      . ALPRAZolam (XANAX XR) 24 hr tablet 1 mg  1 mg Oral Daily Lily Kocher, MD      . ALPRAZolam Duanne Moron) tablet 1 mg  1 mg Oral BID PRN Lily Kocher, MD      . cefTRIAXone (ROCEPHIN) 1 g in dextrose 5 % 50 mL IVPB  1 g Intravenous Q24H Dorrene German, RPH      . diclofenac sodium (VOLTAREN) 1 % transdermal gel 2 g  2 g Topical TID Lily Kocher, MD      . DULoxetine (CYMBALTA) DR capsule 60 mg  60 mg Oral Daily Lily Kocher, MD      . gabapentin (NEURONTIN) capsule 600 mg  600 mg Oral TID Lily Kocher, MD      . hydrOXYzine (ATARAX/VISTARIL) tablet 100 mg  100 mg Oral QHS Lily Kocher, MD   100 mg at 06/25/16 0105  . levothyroxine (SYNTHROID, LEVOTHROID) tablet 50 mcg  50 mcg Oral Daily Lily Kocher, MD   50 mcg at 06/25/16 0749  . ondansetron (ZOFRAN) tablet 4 mg  4 mg Oral Q6H PRN Lily Kocher, MD       Or  .  ondansetron (ZOFRAN) injection 4 mg  4 mg Intravenous Q6H PRN Lily Kocher, MD      . pantoprazole (PROTONIX) EC tablet 40 mg  40 mg Oral Daily Lily Kocher, MD      . sucralfate (CARAFATE) tablet 1 g  1 g Oral TID WC & HS Lily Kocher, MD   1 g at 06/25/16 0749  . topiramate (TOPAMAX) tablet 100 mg  100 mg Oral QHS Lily Kocher, MD   100 mg at 06/25/16 0111  . topiramate  (TOPAMAX) tablet 50 mg  50 mg Oral q morning - 10a Lily Kocher, MD   50 mg at 06/25/16 0101    Musculoskeletal: Strength & Muscle Tone: within normal limits Gait & Station: unable to stand Patient leans: N/A  Psychiatric Specialty Exam: Physical Exam as per history and physical   ROS confusion somewhat disoriented and also frequent auditory/visual hallucinations and talking with the family members who were deceased a long time ago or not in front of her.   No Fever-chills, No Headache, No changes with Vision or hearing, reports vertigo No problems swallowing food or Liquids, No Chest pain, Cough or Shortness of Breath, No Abdominal pain, No Nausea or Vommitting, Bowel movements are regular, No Blood in stool or Urine, No dysuria, No new skin rashes or bruises, No new joints pains-aches,  No new weakness, tingling, numbness in any extremity, No recent weight gain or loss, No polyuria, polydypsia or polyphagia,   A full 10 point Review of Systems was done, except as stated above, all other Review of Systems were negative.  Blood pressure 117/60, pulse 88, temperature 98 F (36.7 C), temperature source Oral, resp. rate 17, height 5' 3"  (1.6 m), weight 97.1 kg (214 lb), SpO2 92 %.Body mass index is 37.91 kg/m.  General Appearance: Casual  Eye Contact:  Good  Speech:  Clear and Coherent  Volume:  Normal  Mood:  Anxious  Affect:  Appropriate and Congruent  Thought Process:  Coherent and Goal Directed  Orientation:  Full (Time, Place, and Person)  Thought Content:  Hallucinations: Auditory Visual  Suicidal Thoughts:  No  Homicidal Thoughts:  No  Memory:  Immediate;   Fair Recent;   Fair Remote;   Fair  Judgement:  Intact  Insight:  Fair  Psychomotor Activity:  Normal  Concentration:  Concentration: Fair and Attention Span: Fair  Recall:  [employer] of Knowledge:  Good  Language:  Good  Akathisia:  Negative  Handed:  Right  AIMS (if indicated):     Assets:   Communication Skills Desire for Improvement Financial Resources/Insurance Housing Intimacy Leisure Time Resilience Social Support Transportation  ADL's:  Intact  Cognition:  Impaired,  Mild  Sleep:        Treatment Plan Summary: 61 years old female with major depressive disorder recurrent, anxiety disorder, chronic pain syndrome, status post surgical intervention to her right leg and presented with delirium with auditory/visual hallucinations and mild cognitive deficits.  Patient has no safety concerns Patient required medical treatment for urinary tract infection and also need to be controlled her pain medication which may cause delirium May provide Seroquel 25 mg at bedtime as needed to control hallucinations and better sleep Appreciate psychiatric consultation and we sign off as of today Please contact [phone] or [phone] if needs further assistance Daily contact with patient to assess and evaluate symptoms and progress in treatment and Medication management  Disposition: She will be referred to the outpatient medication management and medically  stable.  Supportive therapy provided about ongoing stressors.  Ambrose Finland, MD 06/25/2016 9:57 AM

## 2016-06-25 NOTE — Evaluation (Signed)
Physical Therapy Evaluation-1x Patient Details Name: Jennifer Davidson MRN: [id number] DOB: 09/06/1955 Today'Gillis Santas Date: 06/25/2016   History of Present Illness  61 yo female admitted with acute encephalopathy, delirium, hallucinations, UTI. Hx of recent knee arthroscopic surgery 05/2016, NM d/o, HF, HFN, depression with psychotic features, chronic pain, recurrent UTIs.   Clinical Impression  On eval, pt was Mod Ind with mobility. She walked ~250 feet with a RW. Pt tolerated activity well. No acute PT needs. Will sign off. 1x eval.     Follow Up Recommendations No PT follow up    Equipment Recommendations  None recommended by PT    Recommendations for Other Services       Precautions / Restrictions Precautions Precautions: None Restrictions Weight Bearing Restrictions: No      Mobility  Bed Mobility Overal bed mobility: Modified Independent                Transfers Overall transfer level: Modified independent                  Ambulation/Gait Ambulation/Gait assistance: Modified independent (Device/Increase time) Ambulation Distance (Feet): 250 Feet Assistive device: Rolling walker (2 wheeled) Gait Pattern/deviations: Step-through pattern;Decreased stride length     General Gait Details: slow but steady gait speed.   Stairs            Wheelchair Mobility    Modified Rankin (Stroke Patients Only)       Balance                                             Pertinent Vitals/Pain Pain Assessment: No/denies pain    Home Living Family/patient expects to be discharged to:: Private residence Living Arrangements: Spouse/significant other   Type of Home: House Home Access: Stairs to enter   Secretary/administratorntrance Stairs-Number of Steps: 1 Home Layout: One level Home Equipment: Environmental consultantWalker - 2 wheels;Shower seat;Cane - single point      Prior Function Level of Independence: Independent with assistive device(s)         Comments: uses RW vs cane.  (RW since knee surgery-arthroscopic)     Hand Dominance        Extremity/Trunk Assessment   Upper Extremity Assessment Upper Extremity Assessment: Overall WFL for tasks assessed    Lower Extremity Assessment Lower Extremity Assessment: Generalized weakness       Communication   Communication: No difficulties  Cognition Arousal/Alertness: Awake/alert Behavior During Therapy: WFL for tasks assessed/performed Overall Cognitive Status: Within Functional Limits for tasks assessed                      General Comments      Exercises     Assessment/Plan    PT Assessment Patent does not need any further PT services  PT Problem List            PT Treatment Interventions      PT Goals (Current goals can be found in the Care Plan section)  Acute Rehab PT Goals Patient Stated Goal: none stated PT Goal Formulation: All assessment and education complete, DC therapy    Frequency     Barriers to discharge        Co-evaluation               End of Session Equipment Utilized During Treatment: Gait belt Activity Tolerance: Patient tolerated treatment  well Patient left: with call bell/phone within reach;with bed alarm set;with family/visitor present      Functional Assessment Tool Used: clinical judgement Mobility: Walking and Moving Around Current Status ([id number]): At least 1 percent but less than 20 percent impaired, limited or restricted Mobility: Walking and Moving Around Goal Status [phone]): At least 1 percent but less than 20 percent impaired, limited or restricted Mobility: Walking and Moving Around Discharge Status [phone]): At least 1 percent but less than 20 percent impaired, limited or restricted    Time: 1341-1355 PT Time Calculation (min) (ACUTE ONLY): 14 min   Charges:   PT Evaluation $PT Eval Low Complexity: 1 Procedure     PT G Codes:   PT G-Codes **NOT FOR INPATIENT CLASS** Functional Assessment Tool Used: clinical  judgement Mobility: Walking and Moving Around Current Status (A4166): At least 1 percent but less than 20 percent impaired, limited or restricted Mobility: Walking and Moving Around Goal Status [phone]): At least 1 percent but less than 20 percent impaired, limited or restricted Mobility: Walking and Moving Around Discharge Status [phone]): At least 1 percent but less than 20 percent impaired, limited or restricted    Rebeca Alert, MPT Pager: [phone]

## 2016-06-25 NOTE — Progress Notes (Signed)
Pharmacy Antibiotic Note  Jennifer Davidson is a 61 y.o. female with mental status changes admitted on 06/24/2016 with UTI.  Pharmacy has been consulted for rocephin dosing.  Plan: Rocephin 1 gm IV q24h Rx will sign off as no further adjustments necessary  Height: 5\' 3"  (160 cm) IBW/kg (Calculated) : 52.4  Temp (24hrs), Avg:98 F (36.7 C), Min:97.8 F (36.6 C), Max:98.2 F (36.8 C)   Recent Labs Lab 06/23/16 0838 06/23/16 0842 06/24/16 1545  WBC  --  9.7 9.7  CREATININE 1.28*  --   --     Estimated Creatinine Clearance: 50.6 mL/min (by C-G formula based on SCr of 1.28 mg/dL (H)).    Allergies  Allergen Reactions  . Tramadol Nausea And Vomiting  . Trazodone And Nefazodone Other (See Comments)    Per pt trazodone caused hallucinations and behavior changes     Antimicrobials this admission: 2/9 rocephin >>    >>   Dose adjustments this admission:   Microbiology results:  BCx:   UCx:    Sputum:    MRSA PCR:   Thank you for allowing pharmacy to be a part of this patient's care.  Lorenza EvangelistGreen, Eamon Tantillo R 06/25/2016 1:33 AM

## 2016-06-25 NOTE — Clinical Social Work Psych Note (Addendum)
Clinical Social Worker Psych Service Line Progress Note  Clinical Social Worker: Jennifer Hopping, LCSW Date/Time: 06/25/2016, 12:23 PM   Review of Patient  Overall Medical Condition:   Participation Level:  Active Participation Quality: Appropriate Other Participation Quality:  Calm, Pleasant   Affect: Appropriate Cognitive: Appropriate Reaction to Medications/Concerns:   Modes of Intervention: Exploration, Support   Summary of Progress/Plan at Discharge  Summary of Progress/Plan at Discharge: LCSWA and psychiatrist met with patient at bedside, explained role and reason for visit. Patient reports she has been feeling depressed and feels it may be getting worse. Patient reports having auditory and visual hallucinations of family members. She reports her spouse has told her she wakes up in the night talking to her deceased mother. She reports a hx of knee surgery which required her to take several medications and feels it was hard to stop using them. Patient was admitted for UTI.  Patient denies feeling SI/IH. LCSWA inquired about psychiatrist. Patient reports she see Dr. Gilford Rile on [facility]. She reports she made appointment with psychiatrist but missed it due to hospital admission.  She plans to follow up with psychiatrist.  No other needs identified by Psych service line-CSW.  Please re consult if needs arise.

## 2016-06-25 NOTE — H&P (Signed)
History and Physical    Jennifer SantaSusie G Luzier ZOX:[id number] DOB: 03/03/1956 DOA: 06/24/2016  PCP: Norberto SorensonSHAW,EVA, MD   Patient coming from: PCP's office  Chief Complaint: Mental status changes  HPI: Jennifer Davidson is a 61 y.o. woman with a history of chronic diastolic heart failure (compensated), HTN, hypothyroidism, depression with psychotic features, chronic pain, IBS with diarrhea, and recurrent UTIs who is referred from her PCP's office for direct admission for further evaluation and management of delirium and hallucinations that have been present since a right knee surgery on 1/31.  The patient had to stop all of her medications prior to surgery (some meds held 5 days prior), but she says they were resumed the day after surgery (2/1).  Despite this, her husband has witnessed atypical behaviors and hallucinations (referring to relatives who are not present, referring to work though she has been retired for 10+ years).  She has been evaluated in the office the past two days.  She has evidence of a UTI (U/A nitrite positive with large leukocytes).  She received IV Rocephin in the office prior to arrival here.  Her PCP notes that she has had similar presentations in the past with UTI and with narcotic use.  The patient has been on hydrocodone since her knee surgery.  At the time of my evaluation, the patient is calm and appropriate, alert and oriented x 4.  She denies dysuria, frequency, or urine odor.  She has had LHD and reports that she has passed out at home.  No chest pain.  She has subacute, intermittent shortness of breath, present even prior to surgery.  She rates her knee pain a 4 out of 10 at this time.  Review of Systems: As per HPI otherwise 10 point review of systems negative.    Past Medical History:  Diagnosis Date  . Anxiety   . Cholesterol serum increased   . Chronic back pain   . Chronic kidney disease   . Depression   . Diastolic heart failure (HCC)   . GERD (gastroesophageal reflux  disease)   . Hyperparathyroidism (HCC)   . Hypertension   . Hypothyroidism   . Migraine   . Neuromuscular disorder (HCC)   . Sleep apnea   . Tachycardia     Past Surgical History:  Procedure Laterality Date  . ABDOMINAL HYSTERECTOMY    . APPENDECTOMY    . BACK SURGERY    . KNEE SURGERY       reports that she has never smoked. She has never used smokeless tobacco. She reports that she does not drink alcohol or use drugs.  Allergies  Allergen Reactions  . Tramadol Nausea And Vomiting  . Trazodone And Nefazodone Other (See Comments)    Per pt trazodone caused hallucinations and behavior changes     Family History  Problem Relation Age of Onset  . Diabetes Mother   . Heart disease Mother   . Kidney disease Mother   . Thyroid disease Mother   . Heart disease Father   . Seizures Father   . COPD Father   . Cancer Maternal Grandmother      Prior to Admission medications   Medication Sig Start Date End Date Taking? Authorizing Provider  ALPRAZolam (XANAX XR) 1 MG 24 hr tablet Take 1 tablet (1 mg total) by mouth daily. 01/29/16  Yes Sherren MochaEva N Shaw, MD  ALPRAZolam Prudy Feeler(XANAX) 1 MG tablet TAKE 1 TABLET TWICE DAILY AS NEEDED FOR ANXIETY 06/15/16  Yes Sherren MochaEva N Shaw, MD  Calcium-Magnesium-Vitamin D (CALCIUM 1200+D3 PO) Take 1 tablet by mouth daily. Contains 800iu Vitamin D3   Yes Historical Provider, MD  cholecalciferol (VITAMIN D) 1000 UNITS tablet Take 1 tablet (1,000 Units total) by mouth daily at 12 noon. 12/05/14  Yes Adonis Brook, NP  dicyclomine (BENTYL) 20 MG tablet TAKE 1 TABLET FOUR TIMES A DAY BEFORE MEALS AND AT BEDTIME AS NEEDED FOR CRAMPING IRRITABLE BOWELS 11/27/15  Yes [name], MD  DULoxetine (CYMBALTA) 60 MG capsule TAKE 1 CAPSULE (60 MG TOTAL) BY MOUTH DAILY. 03/26/16  Yes [name], MD  esomeprazole (NEXIUM) 40 MG capsule TAKE 1 CAPSULE (40 MG TOTAL) BY MOUTH DAILY. 01/29/16  Yes [name], MD  furosemide (LASIX) 40 MG tablet TAKE 1 TABLET EVERY DAY 06/03/16  Yes [name], MD  gabapentin (NEURONTIN) 300 MG capsule Take 2 capsules (600 mg total) by mouth 3 (three) times daily. 01/29/16  Yes [name], MD  ipratropium (ATROVENT) 0.03 % nasal spray Place 2 sprays into the nose 4 (four) times daily. 03/09/16  Yes [name], MD  levothyroxine (SYNTHROID, LEVOTHROID) 50 MCG tablet TAKE 1 TABLET EVERY DAY 06/20/16  Yes [name], MD  lidocaine (LIDODERM) 5 % PLACE 1 PATCH ONTO THE SKIN DAILY. REMOVE & DISCARD PATCH WITHIN 12 HOURS OR AS DIRECTED BY MD 04/12/15  Yes Historical Provider, MD  methocarbamol (ROBAXIN) 750 MG tablet TAKE 1 TABLET EVERY SIX HOURS AS NEEDED FOR MUSCLE SPASMS. 06/09/16  Yes Stephanie D English, PA  ondansetron (ZOFRAN) 4 MG tablet Take 1 tablet (4 mg total) by mouth every 8 (eight) hours as needed for nausea or vomiting. 06/23/16  Yes [facility], MD  potassium chloride SA (K-DUR,KLOR-CON) 20 MEQ tablet TAKE 1 TABLET (20 MEQ TOTAL) BY MOUTH 2 (TWO) TIMES DAILY. 12/23/15  Yes [name], MD  rizatriptan (MAXALT) 10 MG tablet Take 0.5 tablets (5 mg total) by mouth as needed for migraine. May repeat in 2 hours if needed 01/29/16  Yes [name], MD  sucralfate (CARAFATE) 1 g tablet TAKE 1 TABLET FOUR TIMES DAILY WITH MEALS AND AT BEDTIME. 06/09/16  Yes Collie Siad English, PA  topiramate (TOPAMAX) 100 MG tablet TAKE 1/2 TABLET BY MOUTH IN THE MORNING AND 1 TAB AT BEDTIME 01/29/16  Yes [name], MD  cephALEXin (KEFLEX) 500 MG capsule Take 1 capsule (500 mg total) by mouth 2 (two) times daily. 06/24/16   [name], MD  Diclofenac Sodium 3 % GEL Place 1 application onto the skin 3 (three) times daily. Onto right knee 06/24/16   [name], MD  diphenoxylate-atropine (LOMOTIL) 2.5-0.025 MG tablet TAKE 1 TABLET BY MOUTH 4 TIMES A DAY AS NEEDED FOR DIARRHEA 06/24/16   [name], MD  hydrOXYzine (ATARAX/VISTARIL) 50 MG tablet Take 2 tablets (100 mg total) by mouth at bedtime. May repeat in 6 hours if needed. 06/24/16   [name], MD    Physical  Exam: Vitals:   06/24/16 1915  BP: 124/65  Pulse: 99  Resp: 18  Temp: 97.8 F (36.6 C)  TempSrc: Oral  SpO2: 97%  Height: 5\' 3"  (1.6 m)      Constitutional: NAD, calm, comfortable, NONtoxic appearing Vitals:   06/24/16 1915  BP: 124/65  Pulse: 99  Resp: 18  Temp: 97.8 F (36.6 C)  TempSrc: Oral  SpO2: 97%  Height: 5\' 3"  (1.6 m)   Eyes: PERRL, lids and conjunctivae normal ENMT: Mucous  membranes are moist. Posterior pharynx clear of any exudate or lesions. Normal dentition.  Neck: normal appearance, supple, no masses Respiratory: clear to auscultation bilaterally, no wheezing, no crackles. Normal respiratory effort. No accessory muscle use.  Cardiovascular: Normal rate, regular rhythm, no murmurs / rubs / gallops. No extremity edema. 2+ pedal pulses. GI: abdomen is soft and compressible.  No distention.  No tenderness.  No masses palpated.  Bowel sounds are present. Musculoskeletal:  No joint deformity in upper and lower extremities. Good ROM, no contractures. Normal muscle tone.  Skin: no rashes, warm and dry Neurologic: CN 2-12 grossly intact. Sensation intact, Strength symmetric bilaterally, 5/5. Psychiatric: Normal judgment and insight. Alert and oriented x 3. Normal mood.     Labs on Admission: I have personally reviewed following labs and imaging studies  CBC:  Recent Labs Lab 06/23/16 0842 06/24/16 1545  WBC 9.7 9.7  HGB 12.1* 11.5*  HCT 35.4* 33.3*  MCV 82.2 81.9   Basic Metabolic Panel:  Recent Labs Lab 06/23/16 0838  NA 140  K 4.2  CL 104  CO2 21  GLUCOSE 114*  BUN 11  CREATININE 1.28*  CALCIUM 9.5   GFR: Estimated Creatinine Clearance: 50.6 mL/min (by C-G formula based on SCr of 1.28 mg/dL (H)). Liver Function Tests:  Recent Labs Lab 06/23/16 0838  AST 33  ALT 32  ALKPHOS 104  BILITOT 0.3  PROT 6.8  ALBUMIN 4.1   Urine analysis:    Component Value Date/Time   COLORURINE YELLOW 08/30/2015 2028   APPEARANCEUR CLEAR 08/30/2015  2028   LABSPEC 1.010 08/30/2015 2028   PHURINE 6.0 08/30/2015 2028   GLUCOSEU NEGATIVE 08/30/2015 2028   HGBUR NEGATIVE 08/30/2015 2028   BILIRUBINUR negative 06/24/2016 1618   BILIRUBINUR neg 12/15/2014 1059   KETONESUR negative 06/24/2016 1618   KETONESUR NEGATIVE 08/30/2015 2028   PROTEINUR negative 06/24/2016 1618   PROTEINUR NEGATIVE 08/30/2015 2028   UROBILINOGEN 0.2 06/24/2016 1618   UROBILINOGEN 1.0 12/03/2014 0648   NITRITE Positive (A) 06/24/2016 1618   NITRITE NEGATIVE 08/30/2015 2028   LEUKOCYTESUR large (3+) (A) 06/24/2016 1618    Assessment/Plan Principal Problem:   Acute encephalopathy Active Problems:   Hypertension   Chronic kidney disease   Diastolic heart failure (HCC)   UTI (urinary tract infection)   Severe recurrent depression with psychosis (HCC)      Acute encephalopathy secondary to UTI, recent narcotic exposure, and withholding of several medications peri-operatively --IV Rocephin --Urine culture --Repeat labs, check TSH --Fall precautions --Gentle hydration with NS at 75cc/hr x 10 hours.  HOLD lasix for now. --Try to avoid narcotics for pain.  Hold muscle relaxer.  HTN --No anti-hypertensives on home med list; monitor  Chronic diastolic CHF, compensated --Monitor --Cautious hydration --Would resume lasix in 24 hours  Depression with psychotic features --Atarax 100mg  qHS per PCP recommendation --Continue Xanax, Cymbalta  GERD --Carafate, PPI  Hypothyroidism --Levothyroxine  Other --Neurontin --Topamax  DVT prophylaxis: Low risk, outpatient status Code Status: FULL Family Communication: Patient alone at time of admission. Disposition Plan: To be determined. Consults called: NONE.  Would have low threshold to call psych. Admission status: Place in observation.   TIME SPENT: 50 minutes   Jerene Bears MD [facility] Pager [phone]  If 7PM-7AM, please contact night-coverage [email or web address] Password  [email or web address]  06/25/2016, 12:41 AM

## 2016-06-25 NOTE — Progress Notes (Signed)
Patient seen and examined at bedside, patient admitted after midnight, please see earlier detailed admission note by Dr. Montez Moritaarter. Briefly, patient presented from PCP's office with AMS. Found to have UTI and dehydration. Currently receiving fluids and antibiotics with improvement in mental status.  Jacquelin Hawkingalph Zyann Mabry, MD [facility] 06/25/2016, 3:15 PM Pager: [phone]

## 2016-06-26 DIAGNOSIS — E039 Hypothyroidism, unspecified: Secondary | ICD-10-CM | POA: Diagnosis not present

## 2016-06-26 DIAGNOSIS — N189 Chronic kidney disease, unspecified: Secondary | ICD-10-CM | POA: Diagnosis not present

## 2016-06-26 DIAGNOSIS — I10 Essential (primary) hypertension: Secondary | ICD-10-CM | POA: Diagnosis not present

## 2016-06-26 DIAGNOSIS — N183 Chronic kidney disease, stage 3 (moderate): Secondary | ICD-10-CM | POA: Diagnosis not present

## 2016-06-26 DIAGNOSIS — Z79899 Other long term (current) drug therapy: Secondary | ICD-10-CM | POA: Diagnosis not present

## 2016-06-26 DIAGNOSIS — I5030 Unspecified diastolic (congestive) heart failure: Secondary | ICD-10-CM | POA: Diagnosis not present

## 2016-06-26 DIAGNOSIS — I130 Hypertensive heart and chronic kidney disease with heart failure and stage 1 through stage 4 chronic kidney disease, or unspecified chronic kidney disease: Secondary | ICD-10-CM | POA: Diagnosis not present

## 2016-06-26 DIAGNOSIS — I5032 Chronic diastolic (congestive) heart failure: Secondary | ICD-10-CM | POA: Diagnosis not present

## 2016-06-26 DIAGNOSIS — G9340 Encephalopathy, unspecified: Secondary | ICD-10-CM | POA: Diagnosis not present

## 2016-06-26 DIAGNOSIS — N390 Urinary tract infection, site not specified: Secondary | ICD-10-CM | POA: Diagnosis not present

## 2016-06-26 DIAGNOSIS — F333 Major depressive disorder, recurrent, severe with psychotic symptoms: Secondary | ICD-10-CM | POA: Diagnosis not present

## 2016-06-26 LAB — URINE CULTURE: Culture: NO GROWTH

## 2016-06-26 MED ORDER — CEPHALEXIN 500 MG PO CAPS: 500.0000 mg | ORAL_CAPSULE | Freq: Two times a day (BID) | ORAL | 0 refills | Status: AC

## 2016-06-26 MED ORDER — QUETIAPINE FUMARATE 25 MG PO TABS: 25.0000 mg | ORAL_TABLET | Freq: Every day | ORAL | 0 refills | Status: DC

## 2016-06-26 MED ORDER — GABAPENTIN 400 MG PO CAPS: 400.0000 mg | ORAL_CAPSULE | Freq: Three times a day (TID) | ORAL | 0 refills | Status: DC

## 2016-06-26 NOTE — Progress Notes (Signed)
D/C'd home with spouse.Both verbalized understanding of instructions

## 2016-06-26 NOTE — Discharge Instructions (Signed)
Jennifer Davidson,  You were in the hospital because of hallucinations and acting differently. You were treated for a possible urinary tract infection. Your urine culture was negative, but since I'm not sure if that was before or after antibiotics, I will have you continue antibiotics at home (as prescribed). Psychiatry saw you and recommended Seroquel for you to take at night before bedtime to help with any instances of hallucinations and sleep. Please follow-up with Dr. Clelia CroftShaw and your psychologist when you leave. We discussed you doing things that you enjoy doing and going out to interact with friends and family, rather than being at home all day. Also, we discussed you doing things that you enjoy doing. If you develop thoughts of hurting yourself, or others, please call 911 immediately. Please get sleep and if you can't, discuss further with your primary care physician. It was a pleasure taking care of you and meeting you and your husband.  Sincerely,  Jacquelin Hawkingalph Rayven Hendrickson, MD [facility]

## 2016-06-26 NOTE — Discharge Summary (Signed)
Physician Discharge Summary  Jennifer Davidson JXB:[id number] DOB: 1955/07/04 DOA: 06/24/2016  PCP: Norberto Sorenson, MD  Admit date: 06/24/2016 Discharge date: 06/26/2016  Admitted From: Home via PCP office Disposition: Home  Recommendations for Outpatient Follow-up:  1. Follow up with PCP in 1 week. 2. Sleep hygiene may help with symptoms as there may be a component of chronic fatigue.  Home Health: None Equipment/Devices: None  Discharge Condition: Stable CODE STATUS: Full code Diet recommendation: Heart healthy   Brief/Interim Summary:  HPI written by Michael Litter, MD  Chief Complaint: Mental status changes  HPI: Jennifer Davidson is a 61 y.o. woman with a history of chronic diastolic heart failure (compensated), HTN, hypothyroidism, depression with psychotic features, chronic pain, IBS with diarrhea, and recurrent UTIs who is referred from her PCP's office for direct admission for further evaluation and management of delirium and hallucinations that have been present since a right knee surgery on 1/31.  The patient had to stop all of her medications prior to surgery (some meds held 5 days prior), but she says they were resumed the day after surgery (2/1).  Despite this, her husband has witnessed atypical behaviors and hallucinations (referring to relatives who are not present, referring to work though she has been retired for 10+ years).  She has been evaluated in the office the past two days.  She has evidence of a UTI (U/A nitrite positive with large leukocytes).  She received IV Rocephin in the office prior to arrival here.  Her PCP notes that she has had similar presentations in the past with UTI and with narcotic use.  The patient has been on hydrocodone since her knee surgery.  At the time of my evaluation, the patient is calm and appropriate, alert and oriented x 4.  She denies dysuria, frequency, or urine odor.  She has had LHD and reports that she has passed out at home.  No chest pain.   She has subacute, intermittent shortness of breath, present even prior to surgery.  She rates her knee pain a 4 out of 10 at this time.   Hospital course:  Acute encephalopathy secondary to UTI Patient treated for UTI empirically with ceftriaxone at PCP's office before transfer to [facility]. Urinalysis significant for nitrite and leukocytosis with no microscopy available. Urine culture negative. Patient without urinary symptoms, however, with acute mental status change, continued antibiotic therapy. Unsure of when patient received antibiotic versus providing urine sample for urine culture, so will err on possibility that antibiotic was given prior to collection of urine for urine culture.Mental status improved after one day and patient discharged home with antibiotics.  Essential hypertension Diet controlled  Chronic diastolic congestive heart failure Compensated. Lasix held on admission. Resumed at discharge.  Depression with psychotic features Continued atarax, Xanax and Cymbalta. Psychiatry was consulted and recommended adding Seroquel 25mg  at bedtime.  GERD Continued Carafate and esomeprazole.  Hypothyroidism TSH of 0.851. Continued synthroid.  CKD stage III Stable.     Discharge Diagnoses:  Principal Problem:   Acute encephalopathy Active Problems:   Hypertension   Chronic kidney disease   Diastolic heart failure (HCC)   UTI (urinary tract infection)   Severe recurrent depression with psychosis (HCC)    Discharge Instructions   Allergies as of 06/26/2016      Reactions   Tramadol Nausea And Vomiting   Trazodone And Nefazodone Other (See Comments)   Per pt trazodone caused hallucinations and behavior changes      Medication List  TAKE these medications   ALPRAZolam 1 MG 24 hr tablet Commonly known as:  XANAX XR Take 1 tablet (1 mg total) by mouth daily.   ALPRAZolam 1 MG tablet Commonly known as:  XANAX TAKE 1 TABLET TWICE DAILY AS NEEDED FOR  ANXIETY   CALCIUM 1200+D3 PO Take 1 tablet by mouth daily. Contains 800iu Vitamin D3   cephALEXin 500 MG capsule Commonly known as:  KEFLEX Take 1 capsule (500 mg total) by mouth 2 (two) times daily.   cholecalciferol 1000 units tablet Commonly known as:  VITAMIN D Take 1 tablet (1,000 Units total) by mouth daily at 12 noon.   Diclofenac Sodium 3 % Gel Place 1 application onto the skin 3 (three) times daily. Onto right knee   dicyclomine 20 MG tablet Commonly known as:  BENTYL TAKE 1 TABLET FOUR TIMES A DAY BEFORE MEALS AND AT BEDTIME AS NEEDED FOR CRAMPING IRRITABLE BOWELS   diphenoxylate-atropine 2.5-0.025 MG tablet Commonly known as:  LOMOTIL TAKE 1 TABLET BY MOUTH 4 TIMES A DAY AS NEEDED FOR DIARRHEA   DULoxetine 60 MG capsule Commonly known as:  CYMBALTA TAKE 1 CAPSULE (60 MG TOTAL) BY MOUTH DAILY.   esomeprazole 40 MG capsule Commonly known as:  NEXIUM TAKE 1 CAPSULE (40 MG TOTAL) BY MOUTH DAILY.   furosemide 40 MG tablet Commonly known as:  LASIX TAKE 1 TABLET EVERY DAY   gabapentin 400 MG capsule Commonly known as:  NEURONTIN Take 1 capsule (400 mg total) by mouth 3 (three) times daily. What changed:  medication strength  how much to take   hydrOXYzine 50 MG tablet Commonly known as:  ATARAX/VISTARIL Take 2 tablets (100 mg total) by mouth at bedtime. May repeat in 6 hours if needed.   ipratropium 0.03 % nasal spray Commonly known as:  ATROVENT Place 2 sprays into the nose 4 (four) times daily.   levothyroxine 50 MCG tablet Commonly known as:  SYNTHROID, LEVOTHROID TAKE 1 TABLET EVERY DAY   lidocaine 5 % Commonly known as:  LIDODERM PLACE 1 PATCH ONTO THE SKIN DAILY. REMOVE & DISCARD PATCH WITHIN 12 HOURS OR AS DIRECTED BY MD   methocarbamol 750 MG tablet Commonly known as:  ROBAXIN TAKE 1 TABLET EVERY SIX HOURS AS NEEDED FOR MUSCLE SPASMS.   ondansetron 4 MG tablet Commonly known as:  ZOFRAN Take 1 tablet (4 mg total) by mouth every 8 (eight)  hours as needed for nausea or vomiting.   potassium chloride SA 20 MEQ tablet Commonly known as:  K-DUR,KLOR-CON TAKE 1 TABLET (20 MEQ TOTAL) BY MOUTH 2 (TWO) TIMES DAILY.   QUEtiapine 25 MG tablet Commonly known as:  SEROQUEL Take 1 tablet (25 mg total) by mouth at bedtime.   rizatriptan 10 MG tablet Commonly known as:  MAXALT Take 0.5 tablets (5 mg total) by mouth as needed for migraine. May repeat in 2 hours if needed   sucralfate 1 g tablet Commonly known as:  CARAFATE TAKE 1 TABLET FOUR TIMES DAILY WITH MEALS AND AT BEDTIME.   topiramate 100 MG tablet Commonly known as:  TOPAMAX TAKE 1/2 TABLET BY MOUTH IN THE MORNING AND 1 TAB AT BEDTIME      Follow-up Information    SHAW,EVA, MD. Schedule an appointment as soon as possible for a visit in 1 week(s).   Specialty:  Family Medicine Contact information: [address] [place] [address] [phone]          Allergies  Allergen Reactions  . Tramadol Nausea And Vomiting  .  Trazodone And Nefazodone Other (See Comments)    Per pt trazodone caused hallucinations and behavior changes     Consultations:  Psychiatry   Procedures/Studies:  No results found.    Subjective: Patient reports no issues overnight. No dysuria.  Discharge Exam: Vitals:   06/25/16 1413 06/25/16 2032  BP: 114/68 111/74  Pulse: 81 86  Resp: 16 14  Temp: 98.3 F (36.8 C) 97.9 F (36.6 C)   Vitals:   06/25/16 0556 06/25/16 0752 06/25/16 1413 06/25/16 2032  BP: 107/60 117/60 114/68 111/74  Pulse: 86 88 81 86  Resp: 18 17 16 14   Temp: 98.1 F (36.7 C) 98 F (36.7 C) 98.3 F (36.8 C) 97.9 F (36.6 C)  TempSrc: Oral Oral Oral Oral  SpO2: 94% 92% 100% 100%  Weight:      Height:        General: Pt is alert, awake, not in acute distress Cardiovascular: RRR, S1/S2 +, no rubs, no gallops Respiratory: CTA bilaterally, no wheezing, no rhonchi Abdominal: Soft, NT, ND, bowel sounds + Extremities: no edema, no  cyanosis Psych: flat affect, no suicidal/homicidal ideation    The results of significant diagnostics from this hospitalization (including imaging, microbiology, ancillary and laboratory) are listed below for reference.     Microbiology: Recent Results (from the past 240 hour(s))  Culture, Urine     Status: None   Collection Time: 06/25/16  5:00 AM  Result Value Ref Range Status   Specimen Description URINE, CLEAN CATCH  Final   Special Requests NONE  Final   Culture   Final    NO GROWTH Performed at [facility], [address] [place], [place] [address]    Report Status 06/26/2016 FINAL  Final     Labs: BNP (last 3 results) No results for input(s): BNP in the last 8760 hours. Basic Metabolic Panel:  Recent Labs Lab 06/23/16 0838 06/24/16 1638 06/25/16 0116  NA 140 140 140  K 4.2 4.4 3.8  CL 104 106 113*  CO2 21 21 22   GLUCOSE 114* 96 100*  BUN 11 8 7   CREATININE 1.28* 1.34* 1.28*  CALCIUM 9.5 9.0 8.6*   Liver Function Tests:  Recent Labs Lab 06/23/16 0838 06/24/16 1638 06/25/16 0116  AST 33 38 33  ALT 32 32 32  ALKPHOS 104 93 69  BILITOT 0.3 0.3 0.4  PROT 6.8 6.3 6.3*  ALBUMIN 4.1 3.9 3.4*   No results for input(s): LIPASE, AMYLASE in the last 168 hours. No results for input(s): AMMONIA in the last 168 hours. CBC:  Recent Labs Lab 06/23/16 0842 06/24/16 1545 06/25/16 0116  WBC 9.7 9.7 9.5  NEUTROABS  --   --  5.1  HGB 12.1* 11.5* 10.1*  HCT 35.4* 33.3* 31.5*  MCV 82.2 81.9 85.4  PLT  --   --  232   Cardiac Enzymes: No results for input(s): CKTOTAL, CKMB, CKMBINDEX, TROPONINI in the last 168 hours. BNP: Invalid input(s): POCBNP CBG: No results for input(s): GLUCAP in the last 168 hours. D-Dimer No results for input(s): DDIMER in the last 72 hours. Hgb A1c No results for input(s): HGBA1C in the last 72 hours. Lipid Profile No results for input(s): CHOL, HDL, LDLCALC, TRIG, CHOLHDL, LDLDIRECT in the last 72 hours. Thyroid  function studies  Recent Labs  06/25/16 0116  TSH 0.851   Anemia work up No results for input(s): VITAMINB12, FOLATE, FERRITIN, TIBC, IRON, RETICCTPCT in the last 72 hours. Urinalysis    Component Value Date/Time  COLORURINE YELLOW 08/30/2015 2028   APPEARANCEUR CLEAR 08/30/2015 2028   LABSPEC 1.010 08/30/2015 2028   PHURINE 6.0 08/30/2015 2028   GLUCOSEU NEGATIVE 08/30/2015 2028   HGBUR NEGATIVE 08/30/2015 2028   BILIRUBINUR negative 06/24/2016 1618   BILIRUBINUR neg 12/15/2014 1059   KETONESUR negative 06/24/2016 1618   KETONESUR NEGATIVE 08/30/2015 2028   PROTEINUR negative 06/24/2016 1618   PROTEINUR NEGATIVE 08/30/2015 2028   UROBILINOGEN 0.2 06/24/2016 1618   UROBILINOGEN 1.0 12/03/2014 0648   NITRITE Positive (A) 06/24/2016 1618   NITRITE NEGATIVE 08/30/2015 2028   LEUKOCYTESUR large (3+) (A) 06/24/2016 1618   Sepsis Labs Invalid input(s): PROCALCITONIN,  WBC,  LACTICIDVEN Microbiology Recent Results (from the past 240 hour(s))  Culture, Urine     Status: None   Collection Time: 06/25/16  5:00 AM  Result Value Ref Range Status   Specimen Description URINE, CLEAN CATCH  Final   Special Requests NONE  Final   Culture   Final    NO GROWTH Performed at [facility], [address] [place], [place] [address]    Report Status 06/26/2016 FINAL  Final     Time coordinating discharge: Over 30 minutes  SIGNED:   Jacquelin Hawking, MD [facility] 06/26/2016, 11:45 AM Pager [phone]  If 7PM-7AM, please contact night-coverage [email or web address] Password [email or web address]

## 2016-06-28 ENCOUNTER — Telehealth: Payer: Self-pay

## 2016-06-28 LAB — URINE CULTURE

## 2016-06-28 NOTE — Telephone Encounter (Signed)
Pa need diclofenac gel  Form in provider box

## 2016-06-29 ENCOUNTER — Ambulatory Visit (HOSPITAL_COMMUNITY): Payer: Medicare HMO | Attending: Orthopedic Surgery

## 2016-06-29 DIAGNOSIS — M25661 Stiffness of right knee, not elsewhere classified: Secondary | ICD-10-CM | POA: Diagnosis not present

## 2016-06-29 DIAGNOSIS — R262 Difficulty in walking, not elsewhere classified: Secondary | ICD-10-CM | POA: Insufficient documentation

## 2016-06-29 DIAGNOSIS — R2681 Unsteadiness on feet: Secondary | ICD-10-CM | POA: Insufficient documentation

## 2016-06-29 NOTE — Therapy (Signed)
[facility] [address] [place], [place], [address] Phone: [phone]   Fax:  [phone]  Physical Therapy Evaluation  Patient Details  Name: Jennifer Davidson MRN: [id number] Date of Birth: 01/10/1956 Referring Provider: Jodi Geralds  Encounter Date: 06/29/2016      PT End of Session - 06/29/16 0846    Visit Number 1   Number of Visits 12   Date for PT Re-Evaluation 07/20/16   Authorization Type 06/29/16----08/10/16   Authorization - Visit Number 1   Authorization - Number of Visits 10   PT Start Time 0815   PT Stop Time 0855   PT Time Calculation (min) 40 min   Activity Tolerance Patient tolerated treatment well;No increased pain   Behavior During Therapy WFL for tasks assessed/performed      Past Medical History:  Diagnosis Date  . Anxiety   . Cholesterol serum increased   . Chronic back pain   . Chronic kidney disease   . Depression   . Diastolic heart failure (HCC)   . GERD (gastroesophageal reflux disease)   . Hyperparathyroidism (HCC)   . Hypertension   . Hypothyroidism   . Migraine   . Neuromuscular disorder (HCC)   . Sleep apnea   . Tachycardia     Past Surgical History:  Procedure Laterality Date  . ABDOMINAL HYSTERECTOMY    . APPENDECTOMY    . BACK SURGERY    . KNEE SURGERY      There were no vitals filed for this visit.       Subjective Assessment - 06/29/16 0818    Subjective Pt reports she had a Rt knee arthroscopic debridement on 1/31. Pt report history of falls onto the Rt knee during a period wherein she sustained numberous falls related to Rt radicular problems.    Pertinent History Pt reports she had a Rt knee arthroscopic debridement on 1/31. Pt report history of falls onto the Rt knee during a period wherein she sustained numberous falls related to Rt radicular problems. Pt is now s/p L4L5 fusion  (5+YA) and now how has problems with sciatica on Rt and L2/3 on Rt.    How long can you sit  comfortably? 30 minutes   How long can you stand comfortably? 5 minutes    How long can you walk comfortably? 30 minutes (during a grocery trip)    Patient Stated Goals get legs stronger to walk better   Currently in Pain? No/denies  reports she is pain free at moment             [facility] PT Assessment - 06/29/16 0001      Assessment   Medical Diagnosis s/p Rt knee scope    Referring Provider Jodi Geralds   Onset Date/Surgical Date 06/16/16   Next MD Visit Early March   Prior Therapy Low Back only     Precautions   Precautions None     Restrictions   Weight Bearing Restrictions No     Balance Screen   Has the patient fallen in the past 6 months Yes   How many times? 4   Has the patient had a decrease in activity level because of a fear of falling?  Yes   Is the patient reluctant to leave their home because of a fear of falling?  Yes     Prior Function   Level of Independence Independent   Vocation On disability     Observation/Other Assessments   Focus  on Therapeutic Outcomes (FOTO)  42 (58% impaired)      Sensation   Light Touch Appears Intact  intermttent Rt lateral foot numbness related to back     ROM / Strength   AROM / PROM / Strength PROM     PROM   PROM Assessment Site Knee   Right/Left Knee Right   Right Knee Extension 7   Right Knee Flexion 109                   [facility] Adult PT Treatment/Exercise - 2016-07-08 0001      Exercises   Exercises Knee/Hip     Knee/Hip Exercises: Seated   Long Arc Quad 1 set;Right;10 reps     Knee/Hip Exercises: Supine   Quad Sets 1 set;15 reps;Strengthening  1x15x3sH (HEP)   Heel Slides Right;Other (comment);AROM  3 minutes (HEP)   Bridges 1 set;10 reps;Both;Strengthening  HEP   Straight Leg Raises Right;1 set;10 reps;Strengthening  (HEP)   Other Supine Knee/Hip Exercises Supine Hip Abduction heel slides: 1x15 (HEP)   Other Supine Knee/Hip Exercises Bent knee raise: 1x10 (HEP)                   PT Short Term Goals - July 08, 2016 0857      PT SHORT TERM GOAL #1   Title After 3 weeks pt will demonstrate improved functional strength AEB 5xSTS<18s hands free.    Status New     PT SHORT TERM GOAL #2   Title After 3 weeks pt will demonstrated improved balance AEB SLS>10s bilat.    Status New     PT SHORT TERM GOAL #3   Title After 3 weeks pt will demonstrate improved gait for access of the home AEB @ >0.42m/s.    Status New           PT Long Term Goals - 08-Jul-2016 0859      PT LONG TERM GOAL #1   Title After 6 weeks pt will demonstrate improved functional strength AEB 5xSTS<12s hands free.    Status New     PT LONG TERM GOAL #2   Title After 6 weeks pt will demonstrated improved balance AEB SLS>20s bilat.    Status New     PT LONG TERM GOAL #3   Title After 6 weeks pt will demonstrate improved gait for access of the home AEB @ >1.50m/s.    Status New               Plan - 07-08-2016 0848    Clinical Impression Statement Pt presenting 2 weeks s/p Rt knee scope, with demonstrated impairment of strength, ROM, balance, postoperative pain, all limiting the ability to perform ADL, IALD, and simple household mobility at PLOF. Pt will benefit from skilled PT intervention to address these deficits and restore to PLOF.    Rehab Potential Good   PT Frequency 2x / week   PT Duration 6 weeks   PT Treatment/Interventions ADLs/Self Care Home Management;Functional mobility training;Stair training;Patient/family education;Balance training;Therapeutic exercise;Therapeutic activities      Patient will benefit from skilled therapeutic intervention in order to improve the following deficits and impairments:  Abnormal gait, Decreased balance, Decreased range of motion, Difficulty walking, Hypomobility, Increased edema, Decreased strength, Decreased mobility, Impaired flexibility, Postural dysfunction, Obesity, Pain  Visit Diagnosis: Stiffness of right knee, not elsewhere  classified  Difficulty in walking, not elsewhere classified  Unsteadiness on feet      G-Codes - 07-08-16 0900  Functional Assessment Tool Used clinical judgment; foto 58% impaired    Functional Limitation Mobility: Walking and moving around   Mobility: Walking and Moving Around Current Status [phone]([id number]) At least 40 percent but less than 60 percent impaired, limited or restricted   Mobility: Walking and Moving Around Goal Status [phone](G8979) At least 20 percent but less than 40 percent impaired, limited or restricted       Problem List Patient Active Problem List   Diagnosis Date Noted  . UTI (urinary tract infection) 06/25/2016  . Acute encephalopathy 06/25/2016  . Severe recurrent depression with psychosis (HCC) 06/25/2016  . Acute gastroenteritis 06/23/2016  . GERD (gastroesophageal reflux disease) 11/13/2015  . Syncope 08/30/2015  . Generalized anxiety disorder 06/14/2015  . Chronic pain of right lower extremity 05/14/2015  . Neuropathy of lower extremity 05/14/2015  . Headache, migraine 01/17/2015  . Severe recurrent major depressive disorder with psychotic features (HCC)   . Major depression, recurrent, chronic (HCC) 12/02/2014  . Mood disorder (HCC) 12/01/2014  . Chronic back pain   . Hyperparathyroidism, 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  . Neuromuscular disorder (HCC) 07/31/2014  . Lower back pain 09/21/2013  . Diastolic heart failure (HCC) 03/17/2012  . Syncope and collapse 02/12/2012  . Hypertension 02/04/2012  . Chronic kidney disease 02/04/2012  . Edema 02/04/2012  . Tachycardia 02/04/2012  . Encounter for long-term (current) use of medications 02/04/2012  . Anxiety and depression 02/04/2012  . Hypothyroid 02/04/2012  . Hyperlipidemia 02/04/2012    9:03 AM, 06/29/16 Rosamaria LintsAllan C Adrian Specht, PT, DPT Physical Therapist at [facility] [phone] (office)     [facility] [address] [place], [place], [address] Phone: [phone]   Fax:  [phone]  Name: Jennifer Davidson MRN: [id number] Date of Birth: 07/19/1955

## 2016-06-29 NOTE — Telephone Encounter (Signed)
shaw

## 2016-07-01 ENCOUNTER — Ambulatory Visit (HOSPITAL_COMMUNITY): Payer: Medicare HMO | Admitting: Physical Therapy

## 2016-07-01 ENCOUNTER — Ambulatory Visit (INDEPENDENT_AMBULATORY_CARE_PROVIDER_SITE_OTHER): Payer: Medicare HMO | Admitting: Family Medicine

## 2016-07-01 ENCOUNTER — Encounter: Payer: Self-pay | Admitting: Family Medicine

## 2016-07-01 VITALS — BP 102/66 | HR 87 | Temp 97.8°F | Resp 18 | Ht 63.0 in | Wt 211.0 lb

## 2016-07-01 DIAGNOSIS — N3001 Acute cystitis with hematuria: Secondary | ICD-10-CM

## 2016-07-01 DIAGNOSIS — G8929 Other chronic pain: Secondary | ICD-10-CM | POA: Diagnosis not present

## 2016-07-01 DIAGNOSIS — F333 Major depressive disorder, recurrent, severe with psychotic symptoms: Secondary | ICD-10-CM | POA: Diagnosis not present

## 2016-07-01 DIAGNOSIS — R410 Disorientation, unspecified: Secondary | ICD-10-CM

## 2016-07-01 DIAGNOSIS — Z79899 Other long term (current) drug therapy: Secondary | ICD-10-CM | POA: Diagnosis not present

## 2016-07-01 DIAGNOSIS — M5441 Lumbago with sciatica, right side: Secondary | ICD-10-CM

## 2016-07-01 DIAGNOSIS — M25661 Stiffness of right knee, not elsewhere classified: Secondary | ICD-10-CM

## 2016-07-01 DIAGNOSIS — G4700 Insomnia, unspecified: Secondary | ICD-10-CM | POA: Diagnosis not present

## 2016-07-01 DIAGNOSIS — R2681 Unsteadiness on feet: Secondary | ICD-10-CM

## 2016-07-01 DIAGNOSIS — R262 Difficulty in walking, not elsewhere classified: Secondary | ICD-10-CM

## 2016-07-01 NOTE — Patient Instructions (Addendum)
   IF you received an x-ray today, you will receive an invoice from [facility]. Please contact [facility] at [phone] with questions or concerns regarding your invoice.   IF you received labwork today, you will receive an invoice from [facility]. Please contact [facility] at [phone] with questions or concerns regarding your invoice.   Our billing staff will not be able to assist you with questions regarding bills from these companies.  You will be contacted with the lab results as soon as they are available. The fastest way to get your results is to activate your My Chart account. Instructions are located on the last page of this paperwork. If you have not heard from us regarding the results in 2 weeks, please contact this office.     Insomnia Insomnia is a sleep disorder that makes it difficult to fall asleep or to stay asleep. Insomnia can cause tiredness (fatigue), low energy, difficulty concentrating, mood swings, and poor performance at work or school. There are three different ways to classify insomnia:  Difficulty falling asleep.  Difficulty staying asleep.  Waking up too early in the morning. Any type of insomnia can be long-term (chronic) or short-term (acute). Both are common. Short-term insomnia usually lasts for three months or less. Chronic insomnia occurs at least three times a week for longer than three months. What are the causes? Insomnia may be caused by another condition, situation, or substance, such as:  Anxiety.  Certain medicines.  Gastroesophageal reflux disease (GERD) or other gastrointestinal conditions.  Asthma or other breathing conditions.  Restless legs syndrome, sleep apnea, or other sleep disorders.  Chronic pain.  Menopause. This may include hot flashes.  Stroke.  Abuse of alcohol, tobacco, or illegal drugs.  Depression.  Caffeine.  Neurological disorders, such as Alzheimer disease.  An overactive thyroid  (hyperthyroidism). The cause of insomnia may not be known. What increases the risk? Risk factors for insomnia include:  Gender. Women are more commonly affected than men.  Age. Insomnia is more common as you get older.  Stress. This may involve your professional or personal life.  Income. Insomnia is more common in people with lower income.  Lack of exercise.  Irregular work schedule or night shifts.  Traveling between different time zones. What are the signs or symptoms? If you have insomnia, trouble falling asleep or trouble staying asleep is the main symptom. This may lead to other symptoms, such as:  Feeling fatigued.  Feeling nervous about going to sleep.  Not feeling rested in the morning.  Having trouble concentrating.  Feeling irritable, anxious, or depressed. How is this treated? Treatment for insomnia depends on the cause. If your insomnia is caused by an underlying condition, treatment will focus on addressing the condition. Treatment may also include:  Medicines to help you sleep.  Counseling or therapy.  Lifestyle adjustments. Follow these instructions at home:  Take medicines only as directed by your health care provider.  Keep regular sleeping and waking hours. Avoid naps.  Keep a sleep diary to help you and your health care provider figure out what could be causing your insomnia. Include:  When you sleep.  When you wake up during the night.  How well you sleep.  How rested you feel the next day.  Any side effects of medicines you are taking.  What you eat and drink.  Make your bedroom a comfortable place where it is easy to fall asleep:  Put up shades or special blackout curtains to block light   from outside.  Use a white noise machine to block noise.  Keep the temperature cool.  Exercise regularly as directed by your health care provider. Avoid exercising right before bedtime.  Use relaxation techniques to manage stress. Ask your  health care provider to suggest some techniques that may work well for you. These may include:  Breathing exercises.  Routines to release muscle tension.  Visualizing peaceful scenes.  Cut back on alcohol, caffeinated beverages, and cigarettes, especially close to bedtime. These can disrupt your sleep.  Do not overeat or eat spicy foods right before bedtime. This can lead to digestive discomfort that can make it hard for you to sleep.  Limit screen use before bedtime. This includes:  Watching TV.  Using your smartphone, tablet, and computer.  Stick to a routine. This can help you fall asleep faster. Try to do a quiet activity, brush your teeth, and go to bed at the same time each night.  Get out of bed if you are still awake after 15 minutes of trying to sleep. Keep the lights down, but try reading or doing a quiet activity. When you feel sleepy, go back to bed.  Make sure that you drive carefully. Avoid driving if you feel very sleepy.  Keep all follow-up appointments as directed by your health care provider. This is important. Contact a health care provider if:  You are tired throughout the day or have trouble in your daily routine due to sleepiness.  You continue to have sleep problems or your sleep problems get worse. Get help right away if:  You have serious thoughts about hurting yourself or someone else. This information is not intended to replace advice given to you by your health care provider. Make sure you discuss any questions you have with your health care provider. Document Released: 04/30/2000 Document Revised: 10/03/2015 Document Reviewed: 02/01/2014 Elsevier Interactive Patient Education  2017 Elsevier Inc.   

## 2016-07-01 NOTE — Progress Notes (Signed)
Subjective:    Patient ID: Jennifer Davidson, female    DOB: [birth year], 61 y.o.   MRN: [id number] Chief Complaint  Patient presents with  . Hospitalization Follow-up     HPI  She is doing great.  Feeling much better.  She is sleeping well. Wearing her cpap. Using all medications as prescribed.  Is tolerating the seroquel this time.  Past Medical History:  Diagnosis Date  . Anxiety   . Cholesterol serum increased   . Chronic back pain   . Chronic kidney disease   . Depression   . Diastolic heart failure (HCC)   . GERD (gastroesophageal reflux disease)   . Hyperparathyroidism (HCC)   . Hypertension   . Hypothyroidism   . Migraine   . Neuromuscular disorder (HCC)   . Sleep apnea   . Tachycardia    Past Surgical History:  Procedure Laterality Date  . ABDOMINAL HYSTERECTOMY    . APPENDECTOMY    . BACK SURGERY    . KNEE SURGERY     Current Outpatient Prescriptions on File Prior to Visit  Medication Sig Dispense Refill  . ALPRAZolam (XANAX) 1 MG tablet TAKE 1 TABLET TWICE DAILY AS NEEDED FOR ANXIETY 180 tablet 1  . Calcium-Magnesium-Vitamin D (CALCIUM 1200+D3 PO) Take 1 tablet by mouth daily. Contains 800iu Vitamin D3    . cholecalciferol (VITAMIN D) 1000 UNITS tablet Take 1 tablet (1,000 Units total) by mouth daily at 12 noon. 30 tablet 0  . Diclofenac Sodium 3 % GEL Place 1 application onto the skin 3 (three) times daily. Onto right knee 100 g 2  . dicyclomine (BENTYL) 20 MG tablet TAKE 1 TABLET FOUR TIMES A DAY BEFORE MEALS AND AT BEDTIME AS NEEDED FOR CRAMPING IRRITABLE BOWELS 180 tablet 0  . diphenoxylate-atropine (LOMOTIL) 2.5-0.025 MG tablet TAKE 1 TABLET BY MOUTH 4 TIMES A DAY AS NEEDED FOR DIARRHEA 30 tablet 2  . DULoxetine (CYMBALTA) 60 MG capsule TAKE 1 CAPSULE (60 MG TOTAL) BY MOUTH DAILY. 90 capsule 1  . esomeprazole (NEXIUM) 40 MG capsule TAKE 1 CAPSULE (40 MG TOTAL) BY MOUTH DAILY. 90 capsule 3  . furosemide (LASIX) 40 MG tablet TAKE 1 TABLET EVERY DAY 90 tablet 1  .  gabapentin (NEURONTIN) 400 MG capsule Take 1 capsule (400 mg total) by mouth 3 (three) times daily. 90 capsule 0  . hydrOXYzine (ATARAX/VISTARIL) 50 MG tablet Take 2 tablets (100 mg total) by mouth at bedtime. May repeat in 6 hours if needed. 120 tablet 0  . ipratropium (ATROVENT) 0.03 % nasal spray Place 2 sprays into the nose 4 (four) times daily. 90 mL 0  . levothyroxine (SYNTHROID, LEVOTHROID) 50 MCG tablet TAKE 1 TABLET EVERY DAY 90 tablet 1  . lidocaine (LIDODERM) 5 % PLACE 1 PATCH ONTO THE SKIN DAILY. REMOVE & DISCARD PATCH WITHIN 12 HOURS OR AS DIRECTED BY MD  11  . methocarbamol (ROBAXIN) 750 MG tablet TAKE 1 TABLET EVERY SIX HOURS AS NEEDED FOR MUSCLE SPASMS. 360 tablet 1  . ondansetron (ZOFRAN) 4 MG tablet Take 1 tablet (4 mg total) by mouth every 8 (eight) hours as needed for nausea or vomiting. 20 tablet 0  . potassium chloride SA (K-DUR,KLOR-CON) 20 MEQ tablet TAKE 1 TABLET (20 MEQ TOTAL) BY MOUTH 2 (TWO) TIMES DAILY. 180 tablet 1  . rizatriptan (MAXALT) 10 MG tablet Take 0.5 tablets (5 mg total) by mouth as needed for migraine. May repeat in 2 hours if needed 10 tablet 5  . sucralfate (CARAFATE)  1 g tablet TAKE 1 TABLET FOUR TIMES DAILY WITH MEALS AND AT BEDTIME. 360 tablet 1  . ALPRAZolam (XANAX XR) 1 MG 24 hr tablet Take 1 tablet (1 mg total) by mouth daily. (Patient not taking: Reported on 07/01/2016) 30 tablet 5   No current facility-administered medications on file prior to visit.    Allergies  Allergen Reactions  . Tramadol Nausea And Vomiting  . Trazodone And Nefazodone Other (See Comments)    Per pt trazodone caused hallucinations and behavior changes    Family History  Problem Relation Age of Onset  . Diabetes Mother   . Heart disease Mother   . Kidney disease Mother   . Thyroid disease Mother   . Heart disease Father   . Seizures Father   . COPD Father   . Cancer Maternal Grandmother    Social History   Social History  . Marital status: Married    Spouse  name: N/A  . Number of children: N/A  . Years of education: N/A   Social History Main Topics  . Smoking status: Never Smoker  . Smokeless tobacco: Never Used  . Alcohol use No  . Drug use: No  . Sexual activity: Yes    Partners: Male     Comment: married   Other Topics Concern  . None   Social History Narrative  . None   Dep[facility] screen [facility] 2/9 07/01/2016 06/23/2016 04/23/2016 01/29/2016 11/12/2015  Decreased Interest 0 0 0 0 0  Down, Depressed, Hopeless 0 0 0 0 0  PHQ - 2 Score 0 0 0 0 0  Altered sleeping - - - - 0  Tired, decreased energy - - - - 0  Change in appetite - - - - 0  Feeling bad or failure about yourself  - - - - 0  Trouble concentrating - - - - 0  Moving slowly or fidgety/restless - - - - 0  Suicidal thoughts - - - - 0  PHQ-9 Score - - - - 0  Difficult doing work/chores - - - - -     Review of Systems  Constitutional: Negative for activity change, appetite change, chills, diaphoresis, fatigue and fever.  Gastrointestinal: Negative for abdominal pain, constipation, diarrhea and nausea.  Genitourinary: Negative for dysuria.  Musculoskeletal: Positive for arthralgias, back pain and gait problem.  Neurological: Positive for weakness and numbness.  Psychiatric/Behavioral: Positive for behavioral problems, decreased concentration and dysphoric mood. Negative for agitation, confusion, hallucinations, self-injury and sleep disturbance. The patient is nervous/anxious.        Objective:   Physical Exam  Constitutional: She is oriented to person, place, and time. She appears well-developed and well-nourished. No distress.  HENT:  Head: Normocephalic and atraumatic.  Right Ear: External ear normal.  Left Ear: External ear normal.  Eyes: Conjunctivae are normal. No scleral icterus.  Neck: Normal range of motion. Neck supple. No thyromegaly present.  Cardiovascular: Normal rate, regular rhythm, normal heart sounds and intact distal pulses.   Pulmonary/Chest: Effort  normal and breath sounds normal. No respiratory distress.  Musculoskeletal: She exhibits no edema.  Lymphadenopathy:    She has no cervical adenopathy.  Neurological: She is alert and oriented to person, place, and time.  Skin: Skin is warm and dry. She is not diaphoretic. No erythema.  Psychiatric: She has a normal mood and affect. Her behavior is normal.         BP 102/66 (BP Location: Right Arm, Patient Position: Sitting, Cuff Size: Large)  Pulse 87   Temp 97.8 F (36.6 C) (Oral)   Resp 18   Ht 5\' 3"  (1.6 m)   Wt 211 lb (95.7 kg)   SpO2 95%   BMI 37.38 kg/m   Assessment & Plan:   1. Acute cystitis with hematuria - resolved, finished abx course, sxs resolved  2. Insomnia, unspecified type  - states she is sleeping well. cont current regimen. Pt sometimes tries not to sleep despite meds due to PTSD she has not processed.  3. Acute delirium - resolved; I think this was caused by her lack of sleep  4. Severe recurrent major depressive disorder with psychotic features (HCC) - pt feels like she is starting to improve; sleep will help. Will f/u with dr. Noe Gens  5. Encounter for long-term (current) use of medications   6. Chronic bilateral low back pain with right-sided sciatica - stable, follows with pain management - does VERY poorly with narcotics (no addiction/abuse concerns but even minimal/tiny doses make her depression and insomnia DRAMATICALLY worse to a dangerous level.   Ok to refill all meds x 3 mos when requested.  Norberto Sorenson, M.D.  Primary Care at [facility] [address] [place], [place] [address] [phone] phone [phone] fax  07/26/16 11:31 PM

## 2016-07-01 NOTE — Therapy (Signed)
[facility] [address] [place], [place], [address] Phone: [phone]   Fax:  [phone]  Physical Therapy Treatment  Patient Details  Name: Jennifer Davidson MRN: [id number] Date of Birth: 03/13/1956 Referring Provider: Jodi GeraldsJohn Graves  Encounter Date: 07/01/2016      PT End of Session - 07/01/16 1552    Visit Number 2   Number of Visits 12   Date for PT Re-Evaluation 07/20/16   Authorization Type 06/29/16----08/10/16   Authorization - Visit Number 2   Authorization - Number of Visits 10   PT Start Time 1515   PT Stop Time 1545   PT Time Calculation (min) 30 min   Activity Tolerance Patient tolerated treatment well;No increased pain   Behavior During Therapy WFL for tasks assessed/performed      Past Medical History:  Diagnosis Date  . Anxiety   . Cholesterol serum increased   . Chronic back pain   . Chronic kidney disease   . Depression   . Diastolic heart failure (HCC)   . GERD (gastroesophageal reflux disease)   . Hyperparathyroidism (HCC)   . Hypertension   . Hypothyroidism   . Migraine   . Neuromuscular disorder (HCC)   . Sleep apnea   . Tachycardia     Past Surgical History:  Procedure Laterality Date  . ABDOMINAL HYSTERECTOMY    . APPENDECTOMY    . BACK SURGERY    . KNEE SURGERY      There were no vitals filed for this visit.      Subjective Assessment - 07/01/16 1554    Subjective Pt reports compliance with HEP.  Statess she's really only having problems with swelling at this point and weakness, like her knee is going to buckle at times.  Currently 4/10   Currently in Pain? Yes   Pain Score 4    Pain Location Knee   Pain Orientation Right   Pain Descriptors / Indicators Aching;Tightness                         [facility] Adult PT Treatment/Exercise - 07/01/16 0001      Knee/Hip Exercises: Seated   Long Arc Quad 1 set;Right;10 reps   Sit to Starbucks CorporationSand without UE support  2 reps and had to stop  due to pain     Knee/Hip Exercises: Supine   Quad Sets 1 set;15 reps;Strengthening   Heel Slides Right;Other (comment);AROM   Bridges 10 reps   Bridges Limitations reviewed HEP   Straight Leg Raises Right;1 set;10 reps;Strengthening   Straight Leg Raises Limitations reveiwed HEP   Knee Extension Limitations 0   Knee Flexion Limitations 120   Other Supine Knee/Hip Exercises Supine Hip Abduction heel slides: 1x15 (HEP)   Other Supine Knee/Hip Exercises Bent knee raise: 1x10 (HEP)                PT Education - 07/01/16 1551    Education provided Yes   Education Details reviewed HEP and initial evaluation goals   Person(s) Educated Patient   Methods Explanation;Demonstration;Tactile cues;Verbal cues;Handout   Comprehension Verbalized understanding;Returned demonstration;Verbal cues required;Tactile cues required          PT Short Term Goals - 06/29/16 0857      PT SHORT TERM GOAL #1   Title After 3 weeks pt will demonstrate improved functional strength AEB 5xSTS<18s hands free.    Status New     PT SHORT TERM GOAL #2  Title After 3 weeks pt will demonstrated improved balance AEB SLS>10s bilat.    Status New     PT SHORT TERM GOAL #3   Title After 3 weeks pt will demonstrate improved gait for access of the home AEB @ >0.69m/s.    Status New           PT Long Term Goals - 06/29/16 0859      PT LONG TERM GOAL #1   Title After 6 weeks pt will demonstrate improved functional strength AEB 5xSTS<12s hands free.    Status New     PT LONG TERM GOAL #2   Title After 6 weeks pt will demonstrated improved balance AEB SLS>20s bilat.    Status New     PT LONG TERM GOAL #3   Title After 6 weeks pt will demonstrate improved gait for access of the home AEB @ >1.47m/s.    Status New               Plan - 07/01/16 1555    Clinical Impression Statement Reveiwed HEP and goals per intial evaluation with patient.  Able to achieve 0-120 AROM today in Rt knee  despite some swelling.  Encoruaged pateint to try compression hose (states she has some thigh highs at home) and to continue icing her knee to help reduce the swelling.  Pt able to complete all exercises without difficulty.  Added sit to stand, however and only able to complete 2 before Rt knee discomfort increased.  Pt is now ambulating with quad cane without difficulty.    Rehab Potential Good   PT Frequency 2x / week   PT Duration 6 weeks   PT Treatment/Interventions ADLs/Self Care Home Management;Functional mobility training;Stair training;Patient/family education;Balance training;Therapeutic exercise;Therapeutic activities   PT Next Visit Plan Review HEP and treatment goals in full; symmetrical ROM with RW. progress weights in SAQ/LAQ as tolerated.    PT Home Exercise Plan *see handout    Consulted and Agree with Plan of Care Patient      Patient will benefit from skilled therapeutic intervention in order to improve the following deficits and impairments:  Abnormal gait, Decreased balance, Decreased range of motion, Difficulty walking, Hypomobility, Increased edema, Decreased strength, Decreased mobility, Impaired flexibility, Postural dysfunction, Obesity, Pain  Visit Diagnosis: Stiffness of right knee, not elsewhere classified  Unsteadiness on feet  Difficulty in walking, not elsewhere classified     Problem List Patient Active Problem List   Diagnosis Date Noted  . UTI (urinary tract infection) 06/25/2016  . Acute encephalopathy 06/25/2016  . Severe recurrent depression with psychosis (HCC) 06/25/2016  . Acute gastroenteritis 06/23/2016  . GERD (gastroesophageal reflux disease) 11/13/2015  . Syncope 08/30/2015  . Generalized anxiety disorder 06/14/2015  . Chronic pain of right lower extremity 05/14/2015  . Neuropathy of lower extremity 05/14/2015  . Headache, migraine 01/17/2015  . Severe recurrent major depressive disorder with psychotic features (HCC)   . Major  depression, recurrent, chronic (HCC) 12/02/2014  . Mood disorder (HCC) 12/01/2014  . Chronic back pain   . Hyperparathyroidism, 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  . Neuromuscular disorder (HCC) 07/31/2014  . Lower back pain 09/21/2013  . Diastolic heart failure (HCC) 03/17/2012  . Syncope and collapse 02/12/2012  . Hypertension 02/04/2012  . Chronic kidney disease 02/04/2012  . Edema 02/04/2012  . Tachycardia 02/04/2012  . Encounter for long-term (current) use of medications 02/04/2012  . Anxiety and depression 02/04/2012  . Hypothyroid 02/04/2012  . Hyperlipidemia 02/04/2012  Lurena Nida, PTA/CLT [phone]  07/01/2016, 3:58 PM  [facility] [address] [place], [place], [address] Phone: [phone]   Fax:  [phone]  Name: MIRRANDA MONRROY MRN: [id number] Date of Birth: 03/10/1956

## 2016-07-06 ENCOUNTER — Ambulatory Visit (HOSPITAL_COMMUNITY): Payer: Medicare HMO | Admitting: Physical Therapy

## 2016-07-06 DIAGNOSIS — M25661 Stiffness of right knee, not elsewhere classified: Secondary | ICD-10-CM

## 2016-07-06 DIAGNOSIS — R262 Difficulty in walking, not elsewhere classified: Secondary | ICD-10-CM | POA: Diagnosis not present

## 2016-07-06 DIAGNOSIS — R2681 Unsteadiness on feet: Secondary | ICD-10-CM

## 2016-07-06 NOTE — Therapy (Signed)
[facility] [facility] [address] [place], [place], [address] Phone: [phone]   Fax:  [phone]  Physical Therapy Treatment  Patient Details  Name: Jennifer Davidson MRN: [id number] Date of Birth: 08/02/1955 Referring Provider: Jodi GeraldsJohn Graves  Encounter Date: 07/06/2016      PT End of Session - 07/06/16 0923    Visit Number 3   Number of Visits 12   Date for PT Re-Evaluation 07/20/16   Authorization Type 06/29/16----08/10/16   Authorization - Visit Number 3   Authorization - Number of Visits 10   PT Start Time 0818   PT Stop Time 0858   PT Time Calculation (min) 40 min   Activity Tolerance Patient tolerated treatment well;No increased pain   Behavior During Therapy WFL for tasks assessed/performed      Past Medical History:  Diagnosis Date  . Anxiety   . Cholesterol serum increased   . Chronic back pain   . Chronic kidney disease   . Depression   . Diastolic heart failure (HCC)   . GERD (gastroesophageal reflux disease)   . Hyperparathyroidism (HCC)   . Hypertension   . Hypothyroidism   . Migraine   . Neuromuscular disorder (HCC)   . Sleep apnea   . Tachycardia     Past Surgical History:  Procedure Laterality Date  . ABDOMINAL HYSTERECTOMY    . APPENDECTOMY    . BACK SURGERY    . KNEE SURGERY      There were no vitals filed for this visit.      Subjective Assessment - 07/06/16 0904    Subjective Pt states she had right much pain and swelling Sunday and then began wearing her compression stocking yesterday.  States much improvement since wearing these and comes today without AD.    Currently in Pain? No/denies                         [facility] Adult PT Treatment/Exercise - 07/06/16 0001      Knee/Hip Exercises: Standing   Heel Raises 10 reps   Heel Raises Limitations toeraises 10 reps   Knee Flexion Right;10 reps   Forward Step Up Right;10 reps;Step Height: 4";Hand Hold: 1   Forward Step Up Limitations 4"    SLS B 3X with max Rt: 15", Lt: 12"   Gait Training without AD X 250 feet   Other Standing Knee Exercises tandem stance no UE      Knee/Hip Exercises: Seated   Long Arc Quad Right;15 reps   Long Arc Quad Weight 3 lbs.   Sit to Starbucks CorporationSand without UE support;10 reps     Knee/Hip Exercises: Supine   Quad Sets 1 set;15 reps;Strengthening   Short Arc [employer] Timken CompanyQuad Sets Right;15 reps   Short Arc Quad Sets Limitations 3# 3" holds   Heel Slides Right;Other (comment);AROM   Straight Leg Raises Right;15 reps                  PT Short Term Goals - 06/29/16 0857      PT SHORT TERM GOAL #1   Title After 3 weeks pt will demonstrate improved functional strength AEB 5xSTS<18s hands free.    Status New     PT SHORT TERM GOAL #2   Title After 3 weeks pt will demonstrated improved balance AEB SLS>10s bilat.    Status New     PT SHORT TERM GOAL #3   Title After 3 weeks pt will  demonstrate improved gait for access of the home AEB @ >0.30m/s.    Status New           PT Long Term Goals - 06/29/16 0859      PT LONG TERM GOAL #1   Title After 6 weeks pt will demonstrate improved functional strength AEB 5xSTS<12s hands free.    Status New     PT LONG TERM GOAL #2   Title After 6 weeks pt will demonstrated improved balance AEB SLS>20s bilat.    Status New     PT LONG TERM GOAL #3   Title After 6 weeks pt will demonstrate improved gait for access of the home AEB @ >1.49m/s.    Status New               Plan - 07/06/16 1610    Clinical Impression Statement Much improved with reduced swelling and pain since adding compression stockings yesterday.  Pt comes today without AD and no antalgia present.  Began progression of strengthening with addition of weights to mat activities and static balaance actvities.  pt with noted difficulty, unable to maintain balance greater than 15 seconds on Rt LE.  Able to complete STS this session without pain and no pain reported at end of session.     Rehab Potential Good   PT Frequency 2x / week   PT Duration 6 weeks   PT Treatment/Interventions ADLs/Self Care Home Management;Functional mobility training;Stair training;Patient/family education;Balance training;Therapeutic exercise;Therapeutic activities   PT Next Visit Plan Continue strength progression.  Add lunges, lateral and forward step ups/downs.  Advance to dyanamic balance actvities when ready.   PT Home Exercise Plan *see handout    Consulted and Agree with Plan of Care Patient      Patient will benefit from skilled therapeutic intervention in order to improve the following deficits and impairments:  Abnormal gait, Decreased balance, Decreased range of motion, Difficulty walking, Hypomobility, Increased edema, Decreased strength, Decreased mobility, Impaired flexibility, Postural dysfunction, Obesity, Pain  Visit Diagnosis: Stiffness of right knee, not elsewhere classified  Unsteadiness on feet  Difficulty in walking, not elsewhere classified     Problem List Patient Active Problem List   Diagnosis Date Noted  . UTI (urinary tract infection) 06/25/2016  . Acute encephalopathy 06/25/2016  . Severe recurrent depression with psychosis (HCC) 06/25/2016  . Acute gastroenteritis 06/23/2016  . GERD (gastroesophageal reflux disease) 11/13/2015  . Syncope 08/30/2015  . Generalized anxiety disorder 06/14/2015  . Chronic pain of right lower extremity 05/14/2015  . Neuropathy of lower extremity 05/14/2015  . Headache, migraine 01/17/2015  . Severe recurrent major depressive disorder with psychotic features (HCC)   . Major depression, recurrent, chronic (HCC) 12/02/2014  . Mood disorder (HCC) 12/01/2014  . Chronic back pain   . Hyperparathyroidism, 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  . Neuromuscular disorder (HCC) 07/31/2014  . Lower back pain 09/21/2013  . Diastolic heart failure (HCC) 03/17/2012  . Syncope and collapse 02/12/2012  . Hypertension 02/04/2012  . Chronic  kidney disease 02/04/2012  . Edema 02/04/2012  . Tachycardia 02/04/2012  . Encounter for long-term (current) use of medications 02/04/2012  . Anxiety and depression 02/04/2012  . Hypothyroid 02/04/2012  . Hyperlipidemia 02/04/2012    Lurena Nida, PTA/CLT [phone]  07/06/2016, 9:31 AM  [facility] [address] [place], [place], [address] Phone: [phone]   Fax:  [phone]  Name: LATISA BELAY MRN: [id number] Date of Birth: 1955-07-11

## 2016-07-07 ENCOUNTER — Ambulatory Visit: Payer: Commercial Managed Care - HMO | Admitting: Psychiatry

## 2016-07-09 ENCOUNTER — Ambulatory Visit (HOSPITAL_COMMUNITY): Payer: Medicare HMO

## 2016-07-09 ENCOUNTER — Telehealth (HOSPITAL_COMMUNITY): Payer: Self-pay

## 2016-07-09 NOTE — Telephone Encounter (Signed)
Pt did not show for appointment on this day. Pt did not call the office prior to no-show. PT left a message on patient's 'home' number. This is the patient's first no-show.   11:05 AM, 07/09/16 Jennifer LintsAllan C Denelle Capurro, PT, DPT Physical Therapist at [facility] [phone] (office)

## 2016-07-09 NOTE — Telephone Encounter (Signed)
Pt states she called and guess she had the wrong number and l/m a message this morning. I ask her did the answering machine say a name and she said no. I just said Please leave a message. NF 07/09/16

## 2016-07-13 ENCOUNTER — Telehealth (HOSPITAL_COMMUNITY): Payer: Self-pay | Admitting: Family Medicine

## 2016-07-13 ENCOUNTER — Ambulatory Visit (HOSPITAL_COMMUNITY): Payer: Medicare HMO | Admitting: Physical Therapy

## 2016-07-13 NOTE — Telephone Encounter (Signed)
07/13/16 husband left a message to cx - he said that when he came in from work she had had a bad  night

## 2016-07-14 ENCOUNTER — Other Ambulatory Visit: Payer: Self-pay | Admitting: Family Medicine

## 2016-07-15 ENCOUNTER — Ambulatory Visit (HOSPITAL_COMMUNITY): Payer: Medicare HMO

## 2016-07-15 ENCOUNTER — Telehealth (HOSPITAL_COMMUNITY): Payer: Self-pay | Admitting: Family Medicine

## 2016-07-15 NOTE — Telephone Encounter (Signed)
3/1/1/ pt left a message to cx - said she had a problem with meds and has lost a lot of energy

## 2016-07-16 ENCOUNTER — Telehealth (HOSPITAL_COMMUNITY): Payer: Self-pay

## 2016-07-16 NOTE — Telephone Encounter (Signed)
She can not be there

## 2016-07-17 NOTE — Telephone Encounter (Signed)
Signed and returned to nurse box 

## 2016-07-20 ENCOUNTER — Ambulatory Visit (HOSPITAL_COMMUNITY): Payer: Medicare HMO | Attending: Orthopedic Surgery

## 2016-07-20 DIAGNOSIS — M25661 Stiffness of right knee, not elsewhere classified: Secondary | ICD-10-CM | POA: Diagnosis not present

## 2016-07-20 DIAGNOSIS — R2681 Unsteadiness on feet: Secondary | ICD-10-CM | POA: Diagnosis not present

## 2016-07-20 DIAGNOSIS — R262 Difficulty in walking, not elsewhere classified: Secondary | ICD-10-CM | POA: Diagnosis not present

## 2016-07-20 NOTE — Therapy (Signed)
PHYSICAL THERAPY DISCHARGE SUMMARY  Visits from Start of Care: 4  Current functional level related to goals / functional outcomes: *see below for detail   Remaining deficits: *see below for detail    Education / Equipment: *see below for detail Plan: Patient agrees to discharge.  Patient goals were not met. Patient is being discharged due to being pleased with the current functional level.  ?????   Postop knee is funcitoning well, strong and ROM WNL, pain mostly resolved. Pt more limited by generalized deconditioning at this time and chronic low back pain exacerbation. Pt requesting DC at this time.    9:00 AM, 07/20/16 Etta Grandchild, PT, DPT Physical Therapist - [place] [phone] (mobile)                           [place], [place], [address] Phone: [phone]   Fax:  [phone]  Physical Therapy Treatment  Patient Details  Name: Jennifer Davidson MRN: [id number] Date of Birth: Apr 13, 1956 Referring Provider: [name]  Encounter Date: 07/20/2016      PT End of Session - 07/20/16 0851    Visit Number 4   Number of Visits 12   Date for PT Re-Evaluation 07/20/16   Authorization Type 06/29/16----08/10/16   Authorization - Visit Number 4   Authorization - Number of Visits 10   PT Start Time 0816   PT Stop Time 3532   PT Time Calculation (min) 38 min   Activity Tolerance Patient limited by pain;Patient limited by fatigue   Behavior During Therapy [facility] for tasks assessed/performed      Past Medical History:  Diagnosis Date  . Anxiety   . Cholesterol serum increased   . Chronic back pain   . Chronic kidney disease   . Depression   . Diastolic heart failure ([place])   . GERD (gastroesophageal reflux disease)   . Hyperparathyroidism ([place])   . Hypertension   . Hypothyroidism   . Migraine   . Neuromuscular disorder ([place])   . Sleep apnea   .  Tachycardia     Past Surgical History:  Procedure Laterality Date  . ABDOMINAL HYSTERECTOMY    . APPENDECTOMY    . BACK SURGERY    . KNEE SURGERY      There were no vitals filed for this visit.      Subjective Assessment - 07/20/16 0820    Subjective Pt reports she wants today to be her last visit. She thinks she is making good progress adn will see the doctor  this week adn will refer to them as to whether additional PT will be needed. Pt reports she missed some visits due to illness.    Pertinent History Pt reports she had a Rt knee arthroscopic debridement on 1/31. Pt report history of falls onto the Rt knee during a period wherein she sustained numberous falls related to Rt radicular problems. Pt is now s/p L4L5 fusion  (5+YA) and now how has problems with sciatica on Rt and L2/3 on Rt.    How long can you sit comfortably? About 45 minutes; 30 minutes at evaluation    How long can you stand comfortably? About 15 minutes now; 5 minutes at evaluation     How long can you walk comfortably? No change, but more limited by back pain now; 30 minutes (during a grocery trip) at evalaution (reported as baseline)  Patient Stated Goals get legs stronger to walk better   Currently in Pain? Yes   Pain Score 8   Knee pain is absent.   Pain Location Back  Central L4/L5 area and Rt lateral lumbar area.    Pain Orientation Right   Pain Descriptors / Indicators Sharp   Pain Type Chronic pain  >7 years            [facility] PT Assessment - 07/20/16 0001      Assessment   Medical Diagnosis s/p Rt knee scope    Referring Provider [name]   Onset Date/Surgical Date 06/16/16   Next MD Visit July 22, 2016   Prior Therapy Low Back only     Precautions   Precautions None     Restrictions   Weight Bearing Restrictions No     Balance Screen   Has the patient fallen in the past 6 months Yes   How many times? 5  on fall since starting therapy   Has the patient had a decrease in activity  level because of a fear of falling?  Yes   Is the patient reluctant to leave their home because of a fear of falling?  Yes     Prior Function   Level of Independence Independent   Vocation On disability     Sensation   Light Touch Appears Intact  intermttent Rt lateral foot numbness related to back   Additional Comments no new areas of numbness     ROM / Strength   AROM / PROM / Strength PROM;Strength     PROM   PROM Assessment Site Knee   Right/Left Knee Right   Right Knee Extension 4  7 degrees 2/13   Right Knee Flexion 129  109 degrees 2/13     Strength   Strength Assessment Site Hip;Knee   Right/Left Hip Right;Left   Right Hip Flexion 5/5   Right Hip Extension 4/5  Tested in standing, A/ROM limited   Right Hip External Rotation  5/5   Right Hip Internal Rotation 5/5   Right Hip ABduction --  horizontal abduction: 5/5    Right Hip ADduction 5/5   Left Hip Flexion 5/5   Left Hip Extension 4/5  Tested in standing, A/ROM limited   Left Hip External Rotation 5/5   Left Hip Internal Rotation 5/5   Left Hip ABduction --  horizontal abduction: 5/5    Left Hip ADduction 5/5   Right/Left Knee Right;Left   Right Knee Flexion 5/5   Right Knee Extension 5/5   Left Knee Flexion 5/5   Left Knee Extension 5/5     Transfers   Five time sit to stand comments  14.31s  31.26s      Ambulation/Gait   Ambulation Distance (Feet) 450 Feet   Assistive device None   Gait Pattern Within Functional Limits   Ambulation Surface Level;Indoor   Gait velocity 0.45ms     Balance   Balance Assessed --  SLS: Rt 6.64s (was <2); Lt 22.68s (was 5)                              PT Education - 07/20/16 0850    Education provided Yes   Education Details importance of continued strengthening   Person(s) Educated Patient   Methods Explanation   Comprehension Verbalized understanding          PT Short Term Goals - 07/20/16 0840  PT SHORT TERM GOAL #1    Title After 3 weeks pt will demonstrate improved functional strength AEB 5xSTS<18s hands free.    Baseline Performed in 14.3s on 08/01/2016   Status Achieved     PT SHORT TERM GOAL #2   Title After 3 weeks pt will demonstrated improved balance AEB SLS>10s bilat.    Baseline On 3/6 Rt: 6.64s; Lt: 22.68s   Status Partially Met     PT SHORT TERM GOAL #3   Title After 3 weeks pt will demonstrate improved gait for access of the home AEB 2MWT @ >0.35ms.    Baseline Able to tolerate only 4554f limited by back pain; average speed 0.9071ms AD    Status Achieved           PT Long Term Goals - 03/03-18-1843      PT LONG TERM GOAL #1   Title After 6 weeks pt will demonstrate improved functional strength AEB 5xSTS<12s hands free.    Status Not Met     PT LONG TERM GOAL #2   Title After 6 weeks pt will demonstrated improved balance AEB SLS>20s bilat.    Baseline Met on the left side    Status Partially Met     PT LONG TERM GOAL #3   Title After 6 weeks pt will demonstrate improved gait for access of the home AEB 2MWT @ >1.70m/770m   Status Not Met               Plan - 03/003/18/184    Clinical Impression Statement Pt reassessed today at halfway poitn in certification. She has met all short term goals, and has made progress toward 1 of 3 long term goals. The patient has only made it to 2 treatment sessiosn since evaluation and would like to DC today. She is having more difficulty with funciton at this point due to her chronic low back pain. MMT reveals normal strength at this time WFL.[facility] reports desopite functional limitations, she is at her baseline. Pt will be DC at this time, and is encouraged to find ways to be physically active in ways that do not aggravate her back pain.    Consulted and Agree with Plan of Care Patient      Patient will benefit from skilled therapeutic intervention in order to improve the following deficits and impairments:  Abnormal gait, Decreased balance,  Decreased range of motion, Difficulty walking, Hypomobility, Increased edema, Decreased strength, Decreased mobility, Impaired flexibility, Postural dysfunction, Obesity, Pain  Visit Diagnosis: Stiffness of right knee, not elsewhere classified  Unsteadiness on feet  Difficulty in walking, not elsewhere classified       G-Codes - 03/003/18/20186    Functional Assessment Tool Used (Outpatient Only) FOTO: 76 (24% impaired)    Functional Limitation Mobility: Walking and moving around   Mobility: Walking and Moving Around Goal Status ([id number] least 20 percent but less than 40 percent impaired, limited or restricted   Mobility: Walking and Moving Around Discharge Status ([id number] least 20 percent but less than 40 percent impaired, limited or restricted      Problem List Patient Active Problem List   Diagnosis Date Noted  . UTI (urinary tract infection) 06/25/2016  . Acute encephalopathy 06/25/2016  . Severe recurrent depression with psychosis (HCC)[place]/01/2017  . Acute gastroenteritis 06/23/2016  . GERD (gastroesophageal reflux disease) 11/13/2015  . Syncope 08/30/2015  . Generalized anxiety disorder 06/14/2015  . Chronic pain of right lower extremity  05/14/2015  . Neuropathy of lower extremity 05/14/2015  . Headache, migraine 01/17/2015  . Severe recurrent major depressive disorder with psychotic features ([place])   . Major depression, recurrent, chronic ([place]) 12/02/2014  . Mood disorder ([place]) 12/01/2014  . Chronic back pain   . Hyperparathyroidism, secondary renal ([place]) 07/31/2014  . Neuromuscular disorder ([place]) 07/31/2014  . Lower back pain 09/21/2013  . Diastolic heart failure ([place]) 03/17/2012  . Syncope and collapse 02/12/2012  . Hypertension 02/04/2012  . Chronic kidney disease 02/04/2012  . Edema 02/04/2012  . Tachycardia 02/04/2012  . Encounter for long-term (current) use of medications 02/04/2012  . Anxiety and depression 02/04/2012  . Hypothyroid 02/04/2012  .  Hyperlipidemia 02/04/2012    9:01 AM, 07/20/16 Etta Grandchild, PT, DPT Physical Therapist at [facility] [phone] (office)      [place] [address] [place], [place], [address] Phone: [phone]   Fax:  [phone]  Name: Jennifer Davidson MRN: [id number] Date of Birth: [birth year]

## 2016-07-21 ENCOUNTER — Telehealth: Payer: Self-pay | Admitting: Family Medicine

## 2016-07-21 NOTE — Telephone Encounter (Signed)
THIS MESSAGE IS FOR DR. SHAW: PATIENT STATES SHE GOT QUETIAPINE FUMARATE 25 MG FROM DR[facility]. SHE SAID DR. SHAW TOLD HER THAT SHE WOULD CONTINUE TO FILL IT FOR HER WHEN SHE RAN OUT. SHE HAS 1 PILL LEFT. SHE JUST WANTS DR. SHAW TO KNOW. BEST PHONE [phone] (CELL) PHARMACY CHOICE IS [facility] [place] ON [address]. MBC

## 2016-07-22 ENCOUNTER — Encounter (HOSPITAL_COMMUNITY): Payer: Self-pay

## 2016-07-22 DIAGNOSIS — M25561 Pain in right knee: Secondary | ICD-10-CM | POA: Diagnosis not present

## 2016-07-22 MED ORDER — QUETIAPINE FUMARATE 25 MG PO TABS: 25.0000 mg | ORAL_TABLET | Freq: Every day | ORAL | 5 refills | Status: DC

## 2016-07-22 NOTE — Telephone Encounter (Signed)
[facility].  If she would prefer, fine to send 6 mo supply in to her mail order pharmacy as well.   How is she sleeping? That is a very low dose and many people might need to eventually go up over time so let me know if she thinks she ever needs to try that.

## 2016-07-27 ENCOUNTER — Encounter (HOSPITAL_COMMUNITY): Payer: Self-pay

## 2016-07-29 ENCOUNTER — Ambulatory Visit: Payer: Medicare HMO | Admitting: Family Medicine

## 2016-08-05 ENCOUNTER — Ambulatory Visit (INDEPENDENT_AMBULATORY_CARE_PROVIDER_SITE_OTHER): Payer: Medicare HMO | Admitting: Family Medicine

## 2016-08-05 ENCOUNTER — Encounter: Payer: Self-pay | Admitting: Family Medicine

## 2016-08-05 VITALS — BP 126/80 | HR 73 | Temp 98.8°F | Resp 16 | Ht 63.0 in | Wt 213.0 lb

## 2016-08-05 DIAGNOSIS — R350 Frequency of micturition: Secondary | ICD-10-CM

## 2016-08-05 DIAGNOSIS — G43011 Migraine without aura, intractable, with status migrainosus: Secondary | ICD-10-CM | POA: Diagnosis not present

## 2016-08-05 DIAGNOSIS — N183 Chronic kidney disease, stage 3 unspecified: Secondary | ICD-10-CM

## 2016-08-05 DIAGNOSIS — R202 Paresthesia of skin: Secondary | ICD-10-CM | POA: Diagnosis not present

## 2016-08-05 DIAGNOSIS — R29818 Other symptoms and signs involving the nervous system: Secondary | ICD-10-CM | POA: Diagnosis not present

## 2016-08-05 DIAGNOSIS — F5105 Insomnia due to other mental disorder: Secondary | ICD-10-CM | POA: Diagnosis not present

## 2016-08-05 DIAGNOSIS — R569 Unspecified convulsions: Secondary | ICD-10-CM

## 2016-08-05 DIAGNOSIS — R5382 Chronic fatigue, unspecified: Secondary | ICD-10-CM | POA: Diagnosis not present

## 2016-08-05 DIAGNOSIS — F99 Mental disorder, not otherwise specified: Secondary | ICD-10-CM

## 2016-08-05 LAB — POCT URINALYSIS DIP (MANUAL ENTRY)
BILIRUBIN UA: NEGATIVE
BILIRUBIN UA: NEGATIVE
Blood, UA: NEGATIVE
GLUCOSE UA: NEGATIVE
LEUKOCYTES UA: NEGATIVE
Nitrite, UA: NEGATIVE
Protein Ur, POC: NEGATIVE
SPEC GRAV UA: 1.01 (ref 1.030–1.035)
Urobilinogen, UA: 0.2 (ref ?–2.0)
pH, UA: 7 (ref 5.0–8.0)

## 2016-08-05 MED ORDER — DIPHENHYDRAMINE HCL 50 MG/ML IJ SOLN
25.0000 mg | Freq: Once | INTRAMUSCULAR | Status: AC
  Administered 2016-08-05: 25 mg via INTRAMUSCULAR

## 2016-08-05 MED ORDER — GABAPENTIN 400 MG PO CAPS: 400.0000 mg | ORAL_CAPSULE | Freq: Three times a day (TID) | ORAL | 0 refills | Status: DC

## 2016-08-05 MED ORDER — PROMETHAZINE HCL 25 MG/ML IJ SOLN
25.0000 mg | Freq: Once | INTRAMUSCULAR | Status: AC
  Administered 2016-08-05: 25 mg via INTRAMUSCULAR

## 2016-08-05 NOTE — Patient Instructions (Addendum)
Do not take the hydroxyzine tonight. If Tanajah is awake tonight go ahead and take all of the regular nighttime medicine except not the hydroxyzine 2 tabs. Instead I would like you to give double of the Seroquel - which means takes 2 tabs of the quetiapine.  If Mendy is still awake 2 hours later you can give her one extra alprazolam and another quetiapine.  You may take 2 tabs of the alprazolam prior to your MRI. I have asked for the neurology appointment stat so please schedule that as soon as they call you.   IF you received an x-ray today, you will receive an invoice from [facility]. Please contact [facility] at [phone] with questions or concerns regarding your invoice.   IF you received labwork today, you will receive an invoice from [facility]. Please contact [facility] at [phone] with questions or concerns regarding your invoice.   Our billing staff will not be able to assist you with questions regarding bills from these companies.  You will be contacted with the lab results as soon as they are available. The fastest way to get your results is to activate your My Chart account. Instructions are located on the last page of this paperwork. If you have not heard from us regarding the results in 2 weeks, please contact this office.     Seizure, Adult A seizure is a sudden burst of abnormal electrical activity in the brain. The abnormal activity temporarily interrupts normal brain function, causing a person to experience any of the following:  Involuntary movements.  Changes in awareness or consciousness.  Uncontrollable shaking (convulsions). Seizures usually last from 30 seconds to 2 minutes. They usually do not cause permanent brain damage unless they are prolonged. What can cause a seizure to happen?  Seizures can happen for many reasons including:  A fever.  Low blood sugar.  A medicine.  An illnesses.  A brain injury. Some people who have a  seizure never have another one. People who have repeated seizures have a condition called epilepsy. What are the symptoms of a seizure?  Symptoms of a seizure vary greatly from person to person. They include:  Convulsions.  Stiffening of the body.  Involuntary movements of the arms or legs.  Loss of consciousness.  Breathing problems.  Falling suddenly.  Confusion.  Head nodding.  Eye blinking or fluttering.  Lip smacking.  Drooling.  Rapid eye movements.  Grunting.  Loss of bladder control and bowel control.  Staring.  Unresponsiveness. Some people have symptoms right before a seizure happens (aura) and right after a seizure happens. Symptoms of an aura include:  Fear or anxiety.  Nausea.  Feeling like the room is spinning (vertigo).  A feeling of having seen or heard something before (deja vu).  Odd tastes or smells.  Changes in vision, such as seeing flashing lights or spots. Symptoms that may follow a seizure include:  Confusion.  Sleepiness.  Headache.  Weakness of one side of the body. Follow these instructions at home: Medicines    Take over-the-counter and prescription medicines only as told by your health care provider.  Avoid any substances that may prevent your medicine from working properly, such as alcohol. Activity   Do not drive, swim, or do any other activities that would be dangerous if you had another seizure. Wait until your health care provider approves.  If you live in the [place] check with your local DMV (department of motor vehicles) to find out about the local driving  laws. Each state has specific rules about when you can legally return to driving.  Get enough rest. Lack of sleep can make seizures more likely to occur. Educating others  Teach friends and family what to do if you have a seizure. They should:  Lay you on the ground to prevent a fall.  Cushion your head and body.  Loosen any tight clothing around your  neck.  Turn you on your side. If vomiting occurs, this helps keep your airway clear.  Stay with you until you recover.  Not hold you down. Holding you down will not stop the seizure.  Not put anything in your mouth.  Know whether or not you need emergency care. General instructions   Contact your health care provider each time you have a seizure.  Avoid anything that has ever triggered a seizure for you.  Keep a seizure diary. Record what you remember about each seizure, especially anything that might have triggered the seizure.  Keep all follow-up visits as told by your health care provider. This is important. Contact a health care provider if:  You have another seizure.  You have seizures more often.  Your seizure symptoms change.  You continue to have seizures with treatment.  You have symptoms of an infection or illness. They might increase your risk of having a seizure. Get help right away if:  You have a seizure:  That lasts longer than 5 minutes.  That is different than previous seizures.  That leaves you unable to speak or use a part of your body.  That makes it harder to breathe.  After a head injury.  You have:  Multiple seizures in a row.  Confusion or a severe headache right after a seizure.  You are having seizures more often.  You do not wake up immediately after a seizure.  You injure yourself during a seizure. These symptoms may represent a serious problem that is an emergency. Do not wait to see if the symptoms will go away. Get medical help right away. Call your local emergency services (911 in the [place] Do not drive yourself to the hospital. This information is not intended to replace advice given to you by your health care provider. Make sure you discuss any questions you have with your health care provider. Document Released: 04/30/2000 Document Revised: 12/28/2015 Document Reviewed: 12/05/2015 Elsevier Interactive Patient Education   2017 [employer].

## 2016-08-05 NOTE — Progress Notes (Addendum)
Subjective:    Patient ID: Jennifer Davidson, female    DOB: 1956/01/05, 61 y.o.   MRN: [id number] Chief Complaint  Patient presents with  . Tingling    left side/ face, hands/ x sunday.pt husband believes she may had a stroke  . Medication Refill    gabapentin/ pt would like this med sent to [facility]     HPI [facility] has doing great, walked, SUnday started feeling poor when she was waking at [facility] - her left face started drawing and chin started moving and head started turning to the left.  Then 10-20 minute later she came out of it and then things went down hill dramatically and all of sudden then she started seeing people and not sleeping and now doing poorly. On seroquel 25mg  qhs Before she was walking, riding a bike, been taking her medicines,  Then since Sunday. Has not slept since.  Had a migraine Wednesday of last week. Since then it has not gone away and gotten progressively worse.  All of her migraine medicines at home haven't touched it and pt is completely intolerant of even small narcotic doses while her nsaid dose is limited by her CKD III due to years of nsaid overuse from chronic pain.  Miserable w/ HA pain.  She states that the left side of the face  Last nigth she thought her daughter was there, her dad was talking to her (who has been dead for years). Since SUn now things have been pro-actively getting worse.  She didn't want to come in to be seen.  Talking to the neighboors and now since she has been having nightmares  20 minutes of lack of control of left side of her face with muscle clonic. HA all over and worsning throughout neuroogic testing.    Depression screen [facility] 2/9 08/05/2016 07/01/2016 06/23/2016 04/23/2016 01/29/2016  Decreased Interest 0 0 0 0 0  Down, Depressed, Hopeless 0 0 0 0 0  PHQ - 2 Score 0 0 0 0 0  Altered sleeping - - - - -  Tired, decreased energy - - - - -  Change in appetite - - - - -  Feeling bad or failure about yourself  - - - - -  Trouble  concentrating - - - - -  Moving slowly or fidgety/restless - - - - -  Suicidal thoughts - - - - -  PHQ-9 Score - - - - -  Difficult doing work/chores - - - - -   Past Medical History:  Diagnosis Date  . Anxiety   . Cholesterol serum increased   . Chronic back pain   . Chronic kidney disease   . Depression   . Diastolic heart failure (HCC)   . GERD (gastroesophageal reflux disease)   . Hyperparathyroidism (HCC)   . Hypertension   . Hypothyroidism   . Migraine   . Neuromuscular disorder (HCC)   . Sleep apnea   . Tachycardia    Past Surgical History:  Procedure Laterality Date  . ABDOMINAL HYSTERECTOMY    . APPENDECTOMY    . BACK SURGERY    . KNEE SURGERY     Current Outpatient Prescriptions on File Prior to Visit  Medication Sig Dispense Refill  . ALPRAZolam (XANAX XR) 1 MG 24 hr tablet Take 1 tablet (1 mg total) by mouth daily. 30 tablet 5  . ALPRAZolam (XANAX) 1 MG tablet TAKE 1 TABLET TWICE DAILY AS NEEDED FOR ANXIETY 180 tablet 1  . Calcium-Magnesium-Vitamin  D (CALCIUM 1200+D3 PO) Take 1 tablet by mouth daily. Contains 800iu Vitamin D3    . cholecalciferol (VITAMIN D) 1000 UNITS tablet Take 1 tablet (1,000 Units total) by mouth daily at 12 noon. 30 tablet 0  . Diclofenac Sodium 3 % GEL Place 1 application onto the skin 3 (three) times daily. Onto right knee 100 g 2  . dicyclomine (BENTYL) 20 MG tablet TAKE 1 TABLET FOUR TIMES A DAY BEFORE MEALS AND AT BEDTIME AS NEEDED FOR CRAMPING IRRITABLE BOWELS 180 tablet 0  . diphenoxylate-atropine (LOMOTIL) 2.5-0.025 MG tablet TAKE 1 TABLET BY MOUTH 4 TIMES A DAY AS NEEDED FOR DIARRHEA 30 tablet 2  . DULoxetine (CYMBALTA) 60 MG capsule TAKE 1 CAPSULE (60 MG TOTAL) BY MOUTH DAILY. 90 capsule 1  . esomeprazole (NEXIUM) 40 MG capsule TAKE 1 CAPSULE (40 MG TOTAL) BY MOUTH DAILY. 90 capsule 3  . furosemide (LASIX) 40 MG tablet TAKE 1 TABLET EVERY DAY 90 tablet 1  . gabapentin (NEURONTIN) 400 MG capsule Take 1 capsule (400 mg total) by  mouth 3 (three) times daily. 90 capsule 0  . hydrOXYzine (ATARAX/VISTARIL) 50 MG tablet Take 2 tablets (100 mg total) by mouth at bedtime. May repeat in 6 hours if needed. 120 tablet 0  . ipratropium (ATROVENT) 0.03 % nasal spray Place 2 sprays into the nose 4 (four) times daily. 90 mL 0  . levothyroxine (SYNTHROID, LEVOTHROID) 50 MCG tablet TAKE 1 TABLET EVERY DAY 90 tablet 1  . lidocaine (LIDODERM) 5 % PLACE 1 PATCH ONTO THE SKIN DAILY. REMOVE & DISCARD PATCH WITHIN 12 HOURS OR AS DIRECTED BY MD  11  . ondansetron (ZOFRAN) 4 MG tablet Take 1 tablet (4 mg total) by mouth every 8 (eight) hours as needed for nausea or vomiting. 20 tablet 0  . potassium chloride SA (K-DUR,KLOR-CON) 20 MEQ tablet TAKE 1 TABLET (20 MEQ TOTAL) BY MOUTH 2 (TWO) TIMES DAILY. 180 tablet 1  . QUEtiapine (SEROQUEL) 25 MG tablet Take 1 tablet (25 mg total) by mouth at bedtime. 30 tablet 5  . rizatriptan (MAXALT) 10 MG tablet Take 0.5 tablets (5 mg total) by mouth as needed for migraine. May repeat in 2 hours if needed 10 tablet 5  . sucralfate (CARAFATE) 1 g tablet TAKE 1 TABLET FOUR TIMES DAILY WITH MEALS AND AT BEDTIME. 360 tablet 1  . topiramate (TOPAMAX) 100 MG tablet TAKE 1/2 TABLET  IN THE MORNING AND 1 TABLET AT BEDTIME 135 tablet 1  . methocarbamol (ROBAXIN) 750 MG tablet TAKE 1 TABLET EVERY SIX HOURS AS NEEDED FOR MUSCLE SPASMS. (Patient not taking: Reported on 08/05/2016) 360 tablet 1   No current facility-administered medications on file prior to visit.    Allergies  Allergen Reactions  . Tramadol Nausea And Vomiting  . Trazodone And Nefazodone Other (See Comments)    Per pt trazodone caused hallucinations and behavior changes    Family History  Problem Relation Age of Onset  . Diabetes Mother   . Heart disease Mother   . Kidney disease Mother   . Thyroid disease Mother   . Heart disease Father   . Seizures Father   . COPD Father   . Cancer Maternal Grandmother    Social History   Social History  .  Marital status: Married    Spouse name: N/A  . Number of children: N/A  . Years of education: N/A   Social History Main Topics  . Smoking status: Never Smoker  . Smokeless tobacco: Never Used  .  Alcohol use No  . Drug use: No  . Sexual activity: Yes    Partners: Male     Comment: married   Other Topics Concern  . None   Social History Narrative  . None    Review of Systems See hpi    Objective:   Physical Exam  Constitutional: She is oriented to person, place, and time. She appears well-developed and well-nourished. No distress.  HENT:  Head: Normocephalic and atraumatic.  Right Ear: External ear normal.  Left Ear: External ear normal.  Eyes: Conjunctivae are normal. No scleral icterus.  Neck: Normal range of motion. Neck supple. No thyromegaly present.  Cardiovascular: Normal rate, regular rhythm, normal heart sounds and intact distal pulses.   Pulmonary/Chest: Effort normal and breath sounds normal. No respiratory distress.  Musculoskeletal: She exhibits no edema.  Lymphadenopathy:    She has no cervical adenopathy.  Neurological: She is alert and oriented to person, place, and time.  Skin: Skin is warm and dry. She is not diaphoretic. No erythema.  Psychiatric: Her affect is blunt. Her speech is delayed. She is slowed.    No carotid bruit.   Decreased sensation left face and strenth around left eye decrased sensatio nto all of left face in j Much arder with left leg and left hand in finger to nose and health to shin Left hand alternating movement difficulty with prolonged.   Poor response to instructin BP 126/80   Pulse 73   Temp 98.8 F (37.1 C) (Oral)   Resp 16   Ht 5\' 3"  (1.6 m)   Wt 213 lb (96.6 kg)   SpO2 98%   BMI 37.73 kg/m     bilateral hand shake Difficult with proprioception left finger Neg proantor drift but subtle weakness in left upper extremity  Assessment & Plan:  CVA vs seizure, r/o mass lesion - recurrent episodes of left face tic x  20 min followed by with loss of sensation and strenth though left face - pt presented 4d after episode and seems to be in "post-ictal" state except with focal neurologic deficits  1. Stage 3 chronic kidney disease   2. Chronic fatigue   3. Urinary frequency   4. Insomnia due to other mental disorder   5. Paresthesia   6. Convulsions, unspecified convulsion type (HCC)   7. Neurological deficit, transient   8. Intractable migraine without aura and with status migrainosus    9.     Anxiety/depression - cont xanax XR 1mg  qam and xanax IR 1mg  qhs.  Cont to see Dr. Noe Gens (therapy with [facility]) - pt has not followed up recently as she is doing so well but I think she will need this support considering the fluctuating nature of whatever is going on  Orders Placed This Encounter  Procedures  . Urine culture  . MR Brain Wo Contrast    Standing Status:   Future    Standing Expiration Date:   10/05/2017    Order Specific Question:   Reason for Exam (SYMPTOM  OR DIAGNOSIS REQUIRED)    Answer:   CVA vs seizure, r/o mass lesion - episodes of left face tic x 20 min followed by wiht loss of sensation and strenth though left face    Order Specific Question:   What is the patient's sedation requirement?    Answer:   Anti-anxiety    Order Specific Question:   Does the patient have a pacemaker or implanted devices?    Answer:   No  Order Specific Question:   Preferred imaging location?    Answer:   External    Order Specific Question:   Radiology Contrast Protocol - do NOT remove file path    Answer:   [email or web address]  . CBC with Differential/Platelet  . Comprehensive metabolic panel  . Sedimentation Rate  . C-reactive protein  . TSH  . Vitamin B12  . Hemoglobin A1c  . Ambulatory referral to Neurology    Referral Priority:   Urgent    Referral Type:   Consultation    Referral Reason:   Specialty Services Required    Requested Specialty:   Neurology    Number of  Visits Requested:   1  . POCT urinalysis dipstick    Meds ordered this encounter  Medications  . diphenhydrAMINE (BENADRYL) injection 25 mg  . promethazine (PHENERGAN) injection 25 mg  . gabapentin (NEURONTIN) 400 MG capsule    Sig: Take 1 capsule (400 mg total) by mouth 3 (three) times daily.    Dispense:  270 capsule    Refill:  0   Today I have utilized the [place] Controlled Substance Registry's online query to confirm compliance regarding the patient's narcotic pain medications. My review reveals appropriate prescription fills and that [facility] is the sole provider of these medications. Rechecks will occur regularly and the patient is aware of our use of the system.  Over 40 min spent in face-to-face evaluation of and consultation with patient and coordination of care.  Over 50% of this time was spent counseling this patient.   Norberto SorensonEva Shaw, M.D.  [facility] at [facility]   [address] [place], [place] [address] [phone] phone [phone] fax  08/05/16 3:37 PM

## 2016-08-06 ENCOUNTER — Other Ambulatory Visit: Payer: Self-pay | Admitting: Family Medicine

## 2016-08-06 LAB — COMPREHENSIVE METABOLIC PANEL
ALBUMIN: 4.1 g/dL (ref 3.6–4.8)
ALT: 31 IU/L (ref 0–32)
AST: 25 IU/L (ref 0–40)
Albumin/Globulin Ratio: 1.7 (ref 1.2–2.2)
Alkaline Phosphatase: 105 IU/L (ref 39–117)
BUN / CREAT RATIO: 8 — AB (ref 12–28)
BUN: 12 mg/dL (ref 8–27)
Bilirubin Total: 0.2 mg/dL (ref 0.0–1.2)
CALCIUM: 9.4 mg/dL (ref 8.7–10.3)
CO2: 26 mmol/L (ref 18–29)
CREATININE: 1.45 mg/dL — AB (ref 0.57–1.00)
Chloride: 98 mmol/L (ref 96–106)
GFR, EST AFRICAN AMERICAN: 45 mL/min/{1.73_m2} — AB (ref >59)
GFR, EST NON AFRICAN AMERICAN: 39 mL/min/{1.73_m2} — AB (ref >59)
GLUCOSE: 106 mg/dL — AB (ref 65–99)
Globulin, Total: 2.4 g/dL (ref 1.5–4.5)
Potassium: 4 mmol/L (ref 3.5–5.2)
Sodium: 139 mmol/L (ref 134–144)
TOTAL PROTEIN: 6.5 g/dL (ref 6.0–8.5)

## 2016-08-06 LAB — CBC WITH DIFFERENTIAL/PLATELET
BASOS ABS: 0.1 10*3/uL (ref 0.0–0.2)
Basos: 1 %
EOS (ABSOLUTE): 0.4 10*3/uL (ref 0.0–0.4)
EOS: 5 %
HEMATOCRIT: 37.7 % (ref 34.0–46.6)
HEMOGLOBIN: 11.9 g/dL (ref 11.1–15.9)
IMMATURE GRANS (ABS): 0.1 10*3/uL (ref 0.0–0.1)
IMMATURE GRANULOCYTES: 1 %
LYMPHS: 29 %
Lymphocytes Absolute: 2.3 10*3/uL (ref 0.7–3.1)
MCH: 24.5 pg — ABNORMAL LOW (ref 26.6–33.0)
MCHC: 31.6 g/dL (ref 31.5–35.7)
MCV: 78 fL — ABNORMAL LOW (ref 79–97)
Monocytes Absolute: 0.9 10*3/uL (ref 0.1–0.9)
Monocytes: 12 %
Neutrophils Absolute: 4.1 10*3/uL (ref 1.4–7.0)
Neutrophils: 52 %
Platelets: 248 10*3/uL (ref 150–379)
RBC: 4.86 x10E6/uL (ref 3.77–5.28)
RDW: 14.3 % (ref 12.3–15.4)
WBC: 7.8 10*3/uL (ref 3.4–10.8)

## 2016-08-06 LAB — VITAMIN B12: VITAMIN B 12: 760 pg/mL (ref 232–1245)

## 2016-08-06 LAB — TSH: TSH: 1.98 u[IU]/mL (ref 0.450–4.500)

## 2016-08-06 LAB — SEDIMENTATION RATE: SED RATE: 6 mm/h (ref 0–40)

## 2016-08-06 LAB — C-REACTIVE PROTEIN: CRP: 16.6 mg/L — ABNORMAL HIGH (ref 0.0–4.9)

## 2016-08-06 MED ORDER — ALPRAZOLAM ER 1 MG PO TB24: 1.0000 mg | ORAL_TABLET | Freq: Every day | ORAL | 5 refills | Status: DC

## 2016-08-06 NOTE — Telephone Encounter (Signed)
06/15/16 last refill

## 2016-08-07 LAB — HEMOGLOBIN A1C
ESTIMATED AVERAGE GLUCOSE: 128 mg/dL
HEMOGLOBIN A1C: 6.1 % — AB (ref 4.8–5.6)

## 2016-08-07 LAB — URINE CULTURE

## 2016-08-09 ENCOUNTER — Ambulatory Visit (HOSPITAL_COMMUNITY)
Admission: RE | Admit: 2016-08-09 | Discharge: 2016-08-09 | Disposition: A | Discharge status: Home / Self Care | Payer: Medicare HMO | Source: Ambulatory Visit | Attending: Family Medicine | Admitting: Family Medicine

## 2016-08-09 ENCOUNTER — Other Ambulatory Visit: Payer: Self-pay | Admitting: Family Medicine

## 2016-08-09 DIAGNOSIS — R29818 Other symptoms and signs involving the nervous system: Secondary | ICD-10-CM

## 2016-08-09 DIAGNOSIS — R569 Unspecified convulsions: Secondary | ICD-10-CM

## 2016-08-09 DIAGNOSIS — R202 Paresthesia of skin: Secondary | ICD-10-CM | POA: Insufficient documentation

## 2016-08-09 DIAGNOSIS — G43011 Migraine without aura, intractable, with status migrainosus: Secondary | ICD-10-CM | POA: Diagnosis not present

## 2016-08-09 DIAGNOSIS — F99 Mental disorder, not otherwise specified: Secondary | ICD-10-CM | POA: Diagnosis not present

## 2016-08-09 DIAGNOSIS — F5105 Insomnia due to other mental disorder: Secondary | ICD-10-CM | POA: Insufficient documentation

## 2016-08-09 DIAGNOSIS — G839 Paralytic syndrome, unspecified: Secondary | ICD-10-CM | POA: Diagnosis not present

## 2016-08-19 ENCOUNTER — Encounter: Payer: Self-pay | Admitting: Family Medicine

## 2016-08-19 ENCOUNTER — Ambulatory Visit (INDEPENDENT_AMBULATORY_CARE_PROVIDER_SITE_OTHER): Payer: Medicare HMO | Admitting: Family Medicine

## 2016-08-19 ENCOUNTER — Telehealth (HOSPITAL_COMMUNITY): Payer: Self-pay | Admitting: *Deleted

## 2016-08-19 VITALS — BP 118/81 | HR 98 | Temp 98.6°F | Resp 16 | Ht 63.0 in | Wt 210.0 lb

## 2016-08-19 DIAGNOSIS — E039 Hypothyroidism, unspecified: Secondary | ICD-10-CM

## 2016-08-19 DIAGNOSIS — R5382 Chronic fatigue, unspecified: Secondary | ICD-10-CM | POA: Diagnosis not present

## 2016-08-19 DIAGNOSIS — G43011 Migraine without aura, intractable, with status migrainosus: Secondary | ICD-10-CM | POA: Diagnosis not present

## 2016-08-19 DIAGNOSIS — N183 Chronic kidney disease, stage 3 unspecified: Secondary | ICD-10-CM

## 2016-08-19 DIAGNOSIS — R350 Frequency of micturition: Secondary | ICD-10-CM | POA: Diagnosis not present

## 2016-08-19 DIAGNOSIS — F333 Major depressive disorder, recurrent, severe with psychotic symptoms: Secondary | ICD-10-CM

## 2016-08-19 DIAGNOSIS — R569 Unspecified convulsions: Secondary | ICD-10-CM | POA: Diagnosis not present

## 2016-08-19 DIAGNOSIS — F5105 Insomnia due to other mental disorder: Secondary | ICD-10-CM | POA: Diagnosis not present

## 2016-08-19 DIAGNOSIS — F99 Mental disorder, not otherwise specified: Secondary | ICD-10-CM | POA: Diagnosis not present

## 2016-08-19 DIAGNOSIS — D509 Iron deficiency anemia, unspecified: Secondary | ICD-10-CM | POA: Diagnosis not present

## 2016-08-19 LAB — POCT URINALYSIS DIP (MANUAL ENTRY)
BILIRUBIN UA: NEGATIVE
Bilirubin, UA: NEGATIVE
Blood, UA: NEGATIVE
GLUCOSE UA: NEGATIVE
LEUKOCYTES UA: NEGATIVE
Nitrite, UA: NEGATIVE
PROTEIN UA: NEGATIVE
Spec Grav, UA: 1.005 (ref 1.030–1.035)
UROBILINOGEN UA: 0.2 (ref ?–2.0)
pH, UA: 5 (ref 5.0–8.0)

## 2016-08-19 MED ORDER — QUETIAPINE FUMARATE 50 MG PO TABS: 50.0000 mg | ORAL_TABLET | Freq: Every day | ORAL | 0 refills | Status: DC

## 2016-08-19 MED ORDER — DIAZEPAM 5 MG/ML PO CONC: 5.0000 mg | Freq: Three times a day (TID) | ORAL | 0 refills | Status: DC | PRN

## 2016-08-19 NOTE — Telephone Encounter (Signed)
phone call, left voice message regarding an appointment. 

## 2016-08-19 NOTE — Patient Instructions (Addendum)
IF you received an x-ray today, you will receive an invoice from [facility]. Please contact [facility] at [phone] with questions or concerns regarding your invoice.   IF you received labwork today, you will receive an invoice from [facility]. Please contact [facility] at [phone] with questions or concerns regarding your invoice.   Our billing staff will not be able to assist you with questions regarding bills from these companies.  You will be contacted with the lab results as soon as they are available. The fastest way to get your results is to activate your My Chart account. Instructions are located on the last page of this paperwork. If you have not heard from [place] regarding the results in 2 weeks, please contact this office.      Non-Epileptic Seizures, Adult A seizure can cause:  Involuntary movements, like falling or shaking.  Changes in awareness or consciousness.  Convulsions. These are episodes of uncontrollable, jerking movement caused by sudden, intense tightening (contraction) of the muscles. Epileptic seizures are caused by abnormal electrical activity in the brain. Non-epileptic seizures are different. They are not caused by abnormal electrical signals in your brain. These seizures may look like epileptic seizures, but they are not caused by epilepsy. There are two types of non-epileptic seizures:  Physiologic non-epileptic seizure. This type results from an underlying problem that causes a disruption in your brain's electrical activity.  Psychogenic non-epileptic seizure. This type results from emotional stress. These seizures are sometimes called pseudoseizures. What are the causes? Causes of physiologic non-epileptic seizures can include:  Sudden drop in blood pressure.  Low blood sugar (glucose).  Low levels of salt (sodium) in your blood.  Low levels of calcium in your blood.  Migraine.  Heart rhythm problems.  Sleep  disorders, such as narcolepsy.  Movement disorders, such as Tourette syndrome.  Infection.  Certain medicines.  Drug and alcohol abuse.  Fever. Common causes of psychogenic non-epileptic seizures can include:  Stress.  Emotional trauma.  Sexual or physical abuse.  Major life events, such as divorce or death of a loved one.  Mental health disorders, including anxiety and depression. What are the signs or symptoms? Symptoms of a non-epileptic seizure can be similar to those of an epileptic seizure, which may include:  A change in attention or behavior (altered mental status).  Loss of consciousness or fainting.  Convulsions with rhythmic jerking movements.  Drooling.  Rapid eye movements.  Grunting.  Loss of bladder control and bowel control.  Bitter taste in the mouth.  Tongue biting. Some people experience unusual sensations (aura) before having a seizure. These can include:  "Butterflies" in the stomach.  Abnormal smells or tastes.  A feeling of having had a new experience before (dj vu). After a non-epileptic seizure, you may have a headache or sore muscles or feel confused and sleepy. Non-epileptic seizures usually:  Do not cause physical injuries.  Start slowly.  Include crying or shrieking.  Last longer than 2 minutes.  Include pelvic thrusting. How is this diagnosed? Non-epileptic seizures may be diagnosed by:  Your medical history.  A physical exam.  Your symptoms.  Your health care provider may want to talk with your friends or relatives who have seen you have a seizure.  If possible, it is helpful if you write down your seizure activity, including what led up to the seizure, and share that information with your health care provider. You may also need to have tests to look for causes of physiologic  non-epileptic seizures. These may include:  An electroencephalogram (EEG). This test measures electrical activity in your brain. If you  have had a non-epileptic seizure, the results of your EEG will likely be normal.  Video EEG. This test takes place in the hospital over the course of 2-7 days. The test uses a video camera and an EEG to monitor your symptoms and the electrical activity in your brain.  Blood tests.  Lumbar puncture. This test involves pulling fluid from your spine to check for infection.  Electrocardiogram (ECG or EKG). This test checks for an abnormal heart rhythm.  CT scan. If your health care provider thinks you have had a psychogenic non-epileptic seizure, you may need to see a mental health specialist for an evaluation. How is this treated? The treatment for your seizures will depend on what is causing them. When the underlying condition is treated, your seizures should stop. If your seizures are being caused by emotional trauma or stress, your health care provider may recommend that you see a mental health professional. Treatment may include:  Relaxation therapy or cognitive behavioral therapy.  Medicines to treat depression or anxiety.  Individual or family counseling. In some cases, you may have psychogenic seizures in addition to epileptic seizures. If this is the case, you may be prescribed medicine to help with the epileptic seizures. Follow these instructions at home: Home care will depend on the type of non-epileptic seizures that you have. In general:  Follow all instructions from your health care provider. These may include ways to prevent seizures and what to do if you have a seizure.  Take over-the-counter and prescription medicines only as told by your health care provider.  Keep all follow-up visits as told by your health care provider. This is important.  Make sure family members, friends, and coworkers are trained on how to help you if you have a seizure. If you have a seizure, they should:  Lay you on the ground to prevent a fall.  Place a pillow or piece of clothing under your  head.  Loosen any clothing around your neck.  Turn you onto your side. If vomiting occurs, this helps keep your airway clear.  Avoid any substances that may prevent your medicine from working properly. If you are prescribed medicine for seizures:  Do not use recreational drugs.  Limit or avoid alcoholic beverages. Contact a health care provider if:  Your seizures change or become more frequent.  You continue to have seizures after treatment. Get help right away if:  You injure yourself during a seizure.  You have one seizure after another.  You have trouble recovering from a seizure.  You have chest pain or trouble breathing.  You have a seizure that lasts longer than 5 minutes. Summary  Non-epileptic seizures may look like epileptic seizures, but they are not caused by epilepsy.  The treatment for your seizures will depend on what is causing them. When the underlying condition is treated, your seizures should stop.  Make sure family members, friends, and coworkers are trained on how to help you if you have a seizure. If you have a seizure, they should lay you on the ground to prevent a fall, protect your head and neck, and turn you onto your side. This information is not intended to replace advice given to you by your health care provider. Make sure you discuss any questions you have with your health care provider. Document Released: 06/18/2005 Document Revised: 02/13/2016 Document Reviewed: 02/13/2016 Elsevier Interactive  Patient Education  2017 [employer].  

## 2016-08-19 NOTE — Progress Notes (Signed)
Subjective:    Patient ID: Jennifer Davidson, female    DOB: [birth year], 61 y.o.   MRN: [id number] Chief Complaint  Patient presents with  . Follow-up    kidney disease    HPI  On Saturday.  She has had 3-4 episodes.  She feels them coming all over her left side of her face  And then for several days afters he doesn't remember left half of face gets tingling and start drawing to the left.  Her forehead cheek, mouth, and down her left side of her body.  She feels her left hand start shaking.  She holds onto a cup.  No bowel or bladder incontience. Not bit tonght.  Left jaw starts pulling and then she braces herself - has about a montues and then the left arm and leg start jerking and shaking.  Yesterday afternoon she walked w/o falling.  She fell off a bar stool as she was listing over to the right and her shuband told her to sit up and she said she was.  She will sit in a chair.  Last episodes was Sunday and then takes about 3d to fully recover.  She gets upset and it triggers one - when she gets upset about her kids or grandkids; SHe won't take a shower unless her hsuband is there as she will just fall.  She worries she will fall even get up out the shower chair or stepping out of the shower.  Normally will fall when she is walking and can't catch herself.  She hit her face and her left eye is bruised.  For the next 3d she is completely amnesic.  Her husband never saw it.  He knew she was episodes during which she was unresponsive "spells" . He knew an h our before he knew that something was not right - she looses expression in her face and gets a far away look for about an hour before then she feels it and it triggers.  THen she will go straight to sleep immed after.  She doesn't know how long she jerks for as she is "completely out."  Husband thinks it lasts for a couple minutes.  Had her relax as soon as she could like a switch come on.  Drinking a lot of water. Urinating normally.  Clear To fast to be  able to get her a preventative medication.  Sometimes she stares a lot and it takes her a minutes to come back to him - she denies any of this - just thinking.  She has an apptointment the 29th of May at [facility] - Dr. Jaynee Eagles. Her sleeping intermittent - sometimtes good.  Not seeing Dr.Peters as Dr. Ferdinand Lango told him to call 911 and put her in the hosp and that really turned him wrong.  Has had 2-3 migraines which are treated by imitrex and sleep and dont seem related.\] Jerking on the   Past Medical History:  Diagnosis Date  . Anxiety   . Cholesterol serum increased   . Chronic back pain   . Chronic kidney disease   . Depression   . Diastolic heart failure ([place])   . GERD (gastroesophageal reflux disease)   . Hyperparathyroidism ([place])   . Hypertension   . Hypothyroidism   . Migraine   . Neuromuscular disorder ([place])   . Sleep apnea   . Tachycardia    Past Surgical History:  Procedure Laterality Date  . ABDOMINAL HYSTERECTOMY    . APPENDECTOMY    .  BACK SURGERY    . KNEE SURGERY     Current Outpatient Prescriptions on File Prior to Visit  Medication Sig Dispense Refill  . ALPRAZolam (XANAX XR) 1 MG 24 hr tablet Take 1 tablet (1 mg total) by mouth daily. 30 tablet 5  . ALPRAZolam (XANAX) 1 MG tablet TAKE 1 TABLET TWICE DAILY AS NEEDED FOR ANXIETY 180 tablet 1  . Calcium-Magnesium-Vitamin D (CALCIUM 1200+D3 PO) Take 1 tablet by mouth daily. Contains 800iu Vitamin D3    . cholecalciferol (VITAMIN D) 1000 UNITS tablet Take 1 tablet (1,000 Units total) by mouth daily at 12 noon. 30 tablet 0  . Diclofenac Sodium 3 % GEL Place 1 application onto the skin 3 (three) times daily. Onto right knee 100 g 2  . dicyclomine (BENTYL) 20 MG tablet TAKE 1 TABLET FOUR TIMES A DAY BEFORE MEALS AND AT BEDTIME AS NEEDED FOR CRAMPING IRRITABLE BOWELS 180 tablet 0  . diphenoxylate-atropine (LOMOTIL) 2.5-0.025 MG tablet TAKE 1 TABLET BY MOUTH 4 TIMES A DAY AS NEEDED FOR DIARRHEA 30 tablet 2  . DULoxetine  (CYMBALTA) 60 MG capsule TAKE 1 CAPSULE EVERY DAY 90 capsule 1  . esomeprazole (NEXIUM) 40 MG capsule TAKE 1 CAPSULE (40 MG TOTAL) BY MOUTH DAILY. 90 capsule 3  . furosemide (LASIX) 40 MG tablet TAKE 1 TABLET EVERY DAY 90 tablet 1  . gabapentin (NEURONTIN) 400 MG capsule Take 1 capsule (400 mg total) by mouth 3 (three) times daily. 270 capsule 0  . hydrOXYzine (ATARAX/VISTARIL) 50 MG tablet Take 2 tablets (100 mg total) by mouth at bedtime. May repeat in 6 hours if needed. 120 tablet 0  . ipratropium (ATROVENT) 0.03 % nasal spray Place 2 sprays into the nose 4 (four) times daily. 90 mL 0  . levothyroxine (SYNTHROID, LEVOTHROID) 50 MCG tablet TAKE 1 TABLET EVERY DAY 90 tablet 1  . lidocaine (LIDODERM) 5 % PLACE 1 PATCH ONTO THE SKIN DAILY. REMOVE & DISCARD PATCH WITHIN 12 HOURS OR AS DIRECTED BY MD  11  . ondansetron (ZOFRAN) 4 MG tablet Take 1 tablet (4 mg total) by mouth every 8 (eight) hours as needed for nausea or vomiting. 20 tablet 0  . potassium chloride SA (K-DUR,KLOR-CON) 20 MEQ tablet TAKE 1 TABLET (20 MEQ TOTAL) BY MOUTH 2 (TWO) TIMES DAILY. 180 tablet 1  . rizatriptan (MAXALT) 10 MG tablet Take 0.5 tablets (5 mg total) by mouth as needed for migraine. May repeat in 2 hours if needed 10 tablet 5  . sucralfate (CARAFATE) 1 g tablet TAKE 1 TABLET FOUR TIMES DAILY WITH MEALS AND AT BEDTIME. 360 tablet 1  . topiramate (TOPAMAX) 100 MG tablet TAKE 1/2 TABLET  IN THE MORNING AND 1 TABLET AT BEDTIME 135 tablet 1   No current facility-administered medications on file prior to visit.    Allergies  Allergen Reactions  . Tramadol Nausea And Vomiting  . Trazodone And Nefazodone Other (See Comments)    Per pt trazodone caused hallucinations and behavior changes    Family History  Problem Relation Age of Onset  . Diabetes Mother   . Heart disease Mother   . Kidney disease Mother   . Thyroid disease Mother   . Heart disease Father   . Seizures Father   . COPD Father   . Cancer Maternal  Grandmother    Social History   Social History  . Marital status: Married    Spouse name: N/A  . Number of children: N/A  . Years of education: N/A  Social History Main Topics  . Smoking status: Never Smoker  . Smokeless tobacco: Never Used  . Alcohol use No  . Drug use: No  . Sexual activity: Yes    Partners: Male     Comment: married   Other Topics Concern  . None   Social History Narrative  . None   Depression screen [facility] 2/9 08/19/2016 08/05/2016 07/01/2016 06/23/2016 04/23/2016  Decreased Interest 0 0 0 0 0  Down, Depressed, Hopeless 0 0 0 0 0  PHQ - 2 Score 0 0 0 0 0  Altered sleeping - - - - -  Tired, decreased energy - - - - -  Change in appetite - - - - -  Feeling bad or failure about yourself  - - - - -  Trouble concentrating - - - - -  Moving slowly or fidgety/restless - - - - -  Suicidal thoughts - - - - -  PHQ-9 Score - - - - -  Difficult doing work/chores - - - - -    Review of Systems See hpi    Objective:   Physical Exam  Constitutional: She is oriented to person, place, and time. She appears well-developed and well-nourished. She appears listless.  HENT:  Head: Normocephalic and atraumatic.  Right Ear: Tympanic membrane, external ear and ear canal normal.  Left Ear: Tympanic membrane, external ear and ear canal normal.  Nose: Nose normal. Right sinus exhibits no maxillary sinus tenderness. Left sinus exhibits no maxillary sinus tenderness.  Mouth/Throat: Uvula is midline, oropharynx is clear and moist and mucous membranes are normal. No oropharyngeal exudate.  Eyes: Conjunctivae and EOM are normal. Pupils are equal, round, and reactive to light.  Neck: Normal range of motion. Neck supple. No thyromegaly present.  Cardiovascular: Normal rate, regular rhythm, normal heart sounds and intact distal pulses.   Pulmonary/Chest: Effort normal and breath sounds normal. No respiratory distress.  Neurological: She is oriented to person, place, and time. She has  normal strength. She appears listless. She displays atrophy. She displays no tremor. No cranial nerve deficit or sensory deficit. She exhibits normal muscle tone. She displays a negative Romberg sign. Coordination and gait normal.  Skin: Skin is warm and dry. She is not diaphoretic.  Psychiatric: Her speech is normal. She is slowed and withdrawn. She is not agitated, not aggressive and not actively hallucinating. Thought content is not paranoid. Cognition and memory are impaired. She exhibits a depressed mood. She expresses no suicidal plans and no homicidal plans. She exhibits abnormal recent memory.          Assessment & Plan:   1. Stage 3 chronic kidney disease   2. Chronic fatigue   3. Insomnia due to other mental disorder   4. Urinary frequency   5. Intractable migraine without aura and with status migrainosus   6. Hypothyroidism, unspecified type   7. Microcytic anemia   8. Seizure-like activity ([place])   9. Postictal state ([place])   10. Severe episode of recurrent major depressive disorder, with psychotic features ([place])     Orders Placed This Encounter  Procedures  . C-reactive protein  . CBC with Differential/Platelet  . Ferritin  . Iron and TIBC  . BMP8+EGFR  . Ambulatory referral to Psychiatry    Referral Priority:   Urgent    Referral Type:   Psychiatric    Referral Reason:   Specialty Services Required    Requested Specialty:   Psychiatry    Number of Visits Requested:  1  . POCT urinalysis dipstick    Meds ordered this encounter  Medications  . QUEtiapine (SEROQUEL) 50 MG tablet    Sig: Take 1 tablet (50 mg total) by mouth at bedtime.    Dispense:  90 tablet    Refill:  0    Please d/c prior rxs for 25 mg seroquel.  . diazepam (VALIUM) 5 MG/ML solution    Sig: Take 1 mL (5 mg total) by mouth every 8 (eight) hours as needed for anxiety (seizure).    Dispense:  30 mL    Refill:  0   Over 40 min spent in face-to-face evaluation of and consultation with  patient and coordination of care.  Over 50% of this time was spent counseling this patient.   Delman Cheadle, M.D.  Primary Care at [facility] [address] [place], [place] [address] [phone] phone [phone] fax  08/19/16 9:43 PM

## 2016-08-20 ENCOUNTER — Telehealth (HOSPITAL_COMMUNITY): Payer: Self-pay | Admitting: *Deleted

## 2016-08-20 LAB — IRON AND TIBC
Iron Saturation: 26 % (ref 15–55)
Iron: 77 ug/dL (ref 27–139)
Total Iron Binding Capacity: 294 ug/dL (ref 250–450)
UIBC: 217 ug/dL (ref 118–369)

## 2016-08-20 LAB — CBC WITH DIFFERENTIAL/PLATELET
BASOS ABS: 0.1 10*3/uL (ref 0.0–0.2)
BASOS: 1 %
EOS (ABSOLUTE): 0.3 10*3/uL (ref 0.0–0.4)
Eos: 4 %
Hematocrit: 41.2 % (ref 34.0–46.6)
Hemoglobin: 13.1 g/dL (ref 11.1–15.9)
IMMATURE GRANULOCYTES: 1 %
Immature Grans (Abs): 0.1 10*3/uL (ref 0.0–0.1)
LYMPHS: 39 %
Lymphocytes Absolute: 3.4 10*3/uL — ABNORMAL HIGH (ref 0.7–3.1)
MCH: 24.9 pg — ABNORMAL LOW (ref 26.6–33.0)
MCHC: 31.8 g/dL (ref 31.5–35.7)
MCV: 78 fL — AB (ref 79–97)
MONOS ABS: 0.5 10*3/uL (ref 0.1–0.9)
Monocytes: 5 %
NEUTROS PCT: 50 %
Neutrophils Absolute: 4.5 10*3/uL (ref 1.4–7.0)
PLATELETS: 235 10*3/uL (ref 150–379)
RBC: 5.27 x10E6/uL (ref 3.77–5.28)
RDW: 14.3 % (ref 12.3–15.4)
WBC: 8.8 10*3/uL (ref 3.4–10.8)

## 2016-08-20 LAB — BMP8+EGFR
BUN/Creatinine Ratio: 11 — ABNORMAL LOW (ref 12–28)
BUN: 17 mg/dL (ref 8–27)
CHLORIDE: 100 mmol/L (ref 96–106)
CO2: 27 mmol/L (ref 18–29)
CREATININE: 1.53 mg/dL — AB (ref 0.57–1.00)
Calcium: 9.8 mg/dL (ref 8.7–10.3)
GFR, EST AFRICAN AMERICAN: 42 mL/min/{1.73_m2} — AB (ref >59)
GFR, EST NON AFRICAN AMERICAN: 36 mL/min/{1.73_m2} — AB (ref >59)
Glucose: 129 mg/dL — ABNORMAL HIGH (ref 65–99)
Potassium: 3.9 mmol/L (ref 3.5–5.2)
SODIUM: 143 mmol/L (ref 134–144)

## 2016-08-20 LAB — C-REACTIVE PROTEIN: CRP: 8.4 mg/L — AB (ref 0.0–4.9)

## 2016-08-20 LAB — FERRITIN: FERRITIN: 80 ng/mL (ref 15–150)

## 2016-08-20 NOTE — Telephone Encounter (Signed)
Error

## 2016-08-23 ENCOUNTER — Telehealth (HOSPITAL_COMMUNITY): Payer: Self-pay | Admitting: *Deleted

## 2016-08-23 NOTE — Telephone Encounter (Signed)
left voice message to confirm that our provider is in network with [employer].   Please call for appointment.

## 2016-08-23 NOTE — Telephone Encounter (Signed)
SPOKE WITH PATIENT REGARDING AN APPOINTMENT.

## 2016-08-24 NOTE — Telephone Encounter (Signed)
Jennifer Davidson please advise if this message can be closed.  Nothing in the chart.  thanks
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
## 2016-08-30 ENCOUNTER — Other Ambulatory Visit: Payer: Self-pay | Admitting: Family Medicine

## 2016-08-30 DIAGNOSIS — R569 Unspecified convulsions: Secondary | ICD-10-CM

## 2016-08-31 ENCOUNTER — Ambulatory Visit (INDEPENDENT_AMBULATORY_CARE_PROVIDER_SITE_OTHER): Payer: Medicare HMO | Admitting: Psychiatry

## 2016-08-31 ENCOUNTER — Encounter (HOSPITAL_COMMUNITY): Payer: Self-pay | Admitting: Psychiatry

## 2016-08-31 VITALS — BP 121/77 | HR 93 | Ht 63.0 in | Wt 212.8 lb

## 2016-08-31 DIAGNOSIS — F331 Major depressive disorder, recurrent, moderate: Secondary | ICD-10-CM

## 2016-08-31 DIAGNOSIS — N189 Chronic kidney disease, unspecified: Secondary | ICD-10-CM

## 2016-08-31 DIAGNOSIS — Z79899 Other long term (current) drug therapy: Secondary | ICD-10-CM

## 2016-08-31 DIAGNOSIS — I10 Essential (primary) hypertension: Secondary | ICD-10-CM | POA: Diagnosis not present

## 2016-08-31 DIAGNOSIS — F4310 Post-traumatic stress disorder, unspecified: Secondary | ICD-10-CM | POA: Diagnosis not present

## 2016-08-31 DIAGNOSIS — I130 Hypertensive heart and chronic kidney disease with heart failure and stage 1 through stage 4 chronic kidney disease, or unspecified chronic kidney disease: Secondary | ICD-10-CM | POA: Diagnosis not present

## 2016-08-31 HISTORY — DX: Major depressive disorder, recurrent, moderate: F33.1

## 2016-08-31 MED ORDER — LORAZEPAM 1 MG PO TABS: 1.0000 mg | ORAL_TABLET | Freq: Every evening | ORAL | 0 refills | Status: DC | PRN

## 2016-08-31 MED ORDER — QUETIAPINE FUMARATE 25 MG PO TABS: 25.0000 mg | ORAL_TABLET | Freq: Every day | ORAL | 0 refills | Status: DC

## 2016-08-31 MED ORDER — DULOXETINE HCL 30 MG PO CPEP: 90.0000 mg | ORAL_CAPSULE | Freq: Every day | ORAL | 0 refills | Status: DC

## 2016-08-31 NOTE — Patient Instructions (Addendum)
1. Increase duloxetine 90 mg daily 2. Decrease quetiapine 25 mg at night 3. Discontinue Xanax 4. Start lorazepam 1 mg at night as needed for anxiety 5. Contact for therapy: Dr. Daisy Blossom Schneidmiller  [phone] [address] [place], [place] [address] 6. Return to clinic in one month for 30 mins

## 2016-09-01 ENCOUNTER — Ambulatory Visit (INDEPENDENT_AMBULATORY_CARE_PROVIDER_SITE_OTHER): Payer: Medicare HMO | Admitting: Neurology

## 2016-09-01 DIAGNOSIS — R569 Unspecified convulsions: Secondary | ICD-10-CM | POA: Diagnosis not present

## 2016-09-02 ENCOUNTER — Telehealth (HOSPITAL_COMMUNITY): Payer: Self-pay | Admitting: *Deleted

## 2016-09-02 DIAGNOSIS — M25561 Pain in right knee: Secondary | ICD-10-CM | POA: Diagnosis not present

## 2016-09-02 NOTE — Telephone Encounter (Signed)
Prior authorization for Duloxetine received. Myriam Forehand [phone] spoke with June who states a decision will be faxed within 72 hours. Case ID #[id number].

## 2016-09-03 NOTE — Telephone Encounter (Signed)
noted 

## 2016-09-03 NOTE — Telephone Encounter (Signed)
Called pt to inform her about office waiting for approval from her insurance for her Cymbalta and pt husband stated pt is sleep and he will get her awake shortly and he will have her call office.

## 2016-09-03 NOTE — Telephone Encounter (Signed)
Pt called office due to previous message left for her. Informed pt with PA information and she verbalized understanding.

## 2016-09-09 ENCOUNTER — Encounter: Payer: Self-pay | Admitting: Family Medicine

## 2016-09-09 ENCOUNTER — Ambulatory Visit (INDEPENDENT_AMBULATORY_CARE_PROVIDER_SITE_OTHER): Payer: Medicare HMO | Admitting: Family Medicine

## 2016-09-09 VITALS — BP 121/79 | HR 94 | Temp 97.8°F | Resp 18 | Ht 63.0 in | Wt 212.0 lb

## 2016-09-09 DIAGNOSIS — R0981 Nasal congestion: Secondary | ICD-10-CM

## 2016-09-09 DIAGNOSIS — F333 Major depressive disorder, recurrent, severe with psychotic symptoms: Secondary | ICD-10-CM | POA: Diagnosis not present

## 2016-09-09 DIAGNOSIS — R7982 Elevated C-reactive protein (CRP): Secondary | ICD-10-CM

## 2016-09-09 DIAGNOSIS — N183 Chronic kidney disease, stage 3 unspecified: Secondary | ICD-10-CM

## 2016-09-09 DIAGNOSIS — R569 Unspecified convulsions: Secondary | ICD-10-CM

## 2016-09-09 DIAGNOSIS — G5791 Unspecified mononeuropathy of right lower limb: Secondary | ICD-10-CM

## 2016-09-09 DIAGNOSIS — I10 Essential (primary) hypertension: Secondary | ICD-10-CM

## 2016-09-09 DIAGNOSIS — E039 Hypothyroidism, unspecified: Secondary | ICD-10-CM

## 2016-09-09 DIAGNOSIS — F5104 Psychophysiologic insomnia: Secondary | ICD-10-CM | POA: Diagnosis not present

## 2016-09-09 DIAGNOSIS — H6993 Unspecified Eustachian tube disorder, bilateral: Secondary | ICD-10-CM | POA: Diagnosis not present

## 2016-09-09 DIAGNOSIS — Z79899 Other long term (current) drug therapy: Secondary | ICD-10-CM | POA: Diagnosis not present

## 2016-09-09 LAB — POCT URINALYSIS DIP (MANUAL ENTRY)
BILIRUBIN UA: NEGATIVE
Blood, UA: NEGATIVE
GLUCOSE UA: NEGATIVE mg/dL
Ketones, POC UA: NEGATIVE mg/dL
LEUKOCYTES UA: NEGATIVE
NITRITE UA: NEGATIVE
Protein Ur, POC: NEGATIVE mg/dL
Spec Grav, UA: 1.01 (ref 1.010–1.025)
Urobilinogen, UA: 0.2 E.U./dL
pH, UA: 5.5 (ref 5.0–8.0)

## 2016-09-09 MED ORDER — FLUTICASONE PROPIONATE 50 MCG/ACT NA SUSP: 2.0000 | Freq: Every day | NASAL | 2 refills | Status: DC

## 2016-09-09 MED ORDER — GUAIFENESIN ER 1200 MG PO TB12: 1.0000 | ORAL_TABLET | Freq: Two times a day (BID) | ORAL | 1 refills | Status: DC | PRN

## 2016-09-09 MED ORDER — AZELASTINE HCL 0.1 % NA SOLN: 1.0000 | Freq: Two times a day (BID) | NASAL | 12 refills | Status: DC

## 2016-09-09 NOTE — Patient Instructions (Addendum)
   IF you received an x-ray today, you will receive an invoice from [facility]. Please contact [facility] at [phone] with questions or concerns regarding your invoice.   IF you received labwork today, you will receive an invoice from [facility]. Please contact [facility] at [phone] with questions or concerns regarding your invoice.   Our billing staff will not be able to assist you with questions regarding bills from these companies.  You will be contacted with the lab results as soon as they are available. The fastest way to get your results is to activate your My Chart account. Instructions are located on the last page of this paperwork. If you have not heard from us regarding the results in 2 weeks, please contact this office.     Allergic Rhinitis Allergic rhinitis is when the mucous membranes in the nose respond to allergens. Allergens are particles in the air that cause your body to have an allergic reaction. This causes you to release allergic antibodies. Through a chain of events, these eventually cause you to release histamine into the blood stream. Although meant to protect the body, it is this release of histamine that causes your discomfort, such as frequent sneezing, congestion, and an itchy, runny nose. What are the causes? Seasonal allergic rhinitis (hay fever) is caused by pollen allergens that may come from grasses, trees, and weeds. Year-round allergic rhinitis (perennial allergic rhinitis) is caused by allergens such as house dust mites, pet dander, and mold spores. What are the signs or symptoms?  Nasal stuffiness (congestion).  Itchy, runny nose with sneezing and tearing of the eyes. How is this diagnosed? Your health care provider can help you determine the allergen or allergens that trigger your symptoms. If you and your health care provider are unable to determine the allergen, skin or blood testing may be used. Your health care provider will  diagnose your condition after taking your health history and performing a physical exam. Your health care provider may assess you for other related conditions, such as asthma, pink eye, or an ear infection. How is this treated? Allergic rhinitis does not have a cure, but it can be controlled by:  Medicines that block allergy symptoms. These may include allergy shots, nasal sprays, and oral antihistamines.  Avoiding the allergen. Hay fever may often be treated with antihistamines in pill or nasal spray forms. Antihistamines block the effects of histamine. There are over-the-counter medicines that may help with nasal congestion and swelling around the eyes. Check with your health care provider before taking or giving this medicine. If avoiding the allergen or the medicine prescribed do not work, there are many new medicines your health care provider can prescribe. Stronger medicine may be used if initial measures are ineffective. Desensitizing injections can be used if medicine and avoidance does not work. Desensitization is when a patient is given ongoing shots until the body becomes less sensitive to the allergen. Make sure you follow up with your health care provider if problems continue. Follow these instructions at home: It is not possible to completely avoid allergens, but you can reduce your symptoms by taking steps to limit your exposure to them. It helps to know exactly what you are allergic to so that you can avoid your specific triggers. Contact a health care provider if:  You have a fever.  You develop a cough that does not stop easily (persistent).  You have shortness of breath.  You start wheezing.  Symptoms interfere with normal daily activities.   This information is not intended to replace advice given to you by your health care provider. Make sure you discuss any questions you have with your health care provider. Document Released: 01/26/2001 Document Revised: 01/02/2016 Document  Reviewed: 01/08/2013 [employer] Interactive Patient Education  2017 [employer]  

## 2016-09-09 NOTE — Progress Notes (Signed)
Subjective:    Patient ID: Jennifer Davidson, female    DOB: 25-May-1955, 61 y.o.   MRN: [id number] Chief Complaint  Patient presents with  . Follow-up    3 week f/u on kidney disease    HPI  Her appointment with Dr. Rosalyn Davidson on May 1. She was advised to get an EEG prior to that but it has not been scheduled.  Stage III chronic kidney disease for which she follows with Dr. Towanda Davidson did not oh at [facility]. Her ferritin and iron level is normal with a normal hemoglobin though slightly microcytic when last checked 3 weeks prior.   Her CRP has been minimally elevated at 8.4 with a normal high of 4.9 down from 16.6 one month prior with a normal sedimentation rate  Hemoglobin A1c 1 month prior 6.1 with a B12 of 760 and a normal TSH of 1.9  Has not had a seizure in a week. He is giving one to her every night with her night time medicine - valium 1 mL - 5 mg.  Has had some seasonal allergies. Valium does seems to be preventing - more coherent, the aftermth isn't as severe Jennifer Davidson can tell when it is starting. Still has it - puts valium under a tongue, seizure, stops then she goes lays down.  100% better. Took her off the 24hr xanax by Dr. Vanetta Davidson but still has increased anxiety in the evneing when Jennifer Leriche is leaving for work so then she still 1/2 tab of the IR alprazolam - will take 1/2 tab bid.  Sleep is much better. Has been feeling tired this week and today is the first day  Out of thouse. Last Sat she was up and out at soccer 7 am to 4 pm and then on Sun couldn't do anything.  Has switched the alprazolam to the lorazepam per Dr. Vanetta Davidson. Jennifer Davidson doesn't want to pay for cymbalta  tid rather than bid.   Last thur 11:30   First couple of days had face med reaction - red that went away.   Past Medical History:  Diagnosis Date  . Anxiety   . Cholesterol serum increased   . Chronic back pain   . Chronic kidney disease   . Depression   . Diastolic heart failure (HCC)   . GERD  (gastroesophageal reflux disease)   . Hyperparathyroidism (HCC)   . Hypertension   . Hypothyroidism   . Migraine   . Neuromuscular disorder (HCC)   . Sleep apnea   . Tachycardia    Past Surgical History:  Procedure Laterality Date  . ABDOMINAL HYSTERECTOMY    . APPENDECTOMY    . BACK SURGERY    . KNEE SURGERY     Current Outpatient Prescriptions on File Prior to Visit  Medication Sig Dispense Refill  . Calcium-Magnesium-Vitamin D (CALCIUM 1200+D3 PO) Take 1 tablet by mouth daily. Contains 800iu Vitamin D3    . cholecalciferol (VITAMIN D) 1000 UNITS tablet Take 1 tablet (1,000 Units total) by mouth daily at 12 noon. 30 tablet 0  . Diclofenac Sodium 3 % GEL Place 1 application onto the skin 3 (three) times daily. Onto right knee 100 g 2  . dicyclomine (BENTYL) 20 MG tablet TAKE 1 TABLET FOUR TIMES A DAY BEFORE MEALS AND AT BEDTIME AS NEEDED FOR CRAMPING IRRITABLE BOWELS 180 tablet 0  . diphenoxylate-atropine (LOMOTIL) 2.5-0.025 MG tablet TAKE 1 TABLET BY MOUTH 4 TIMES A DAY AS NEEDED FOR DIARRHEA 30 tablet 2  . DULoxetine (  CYMBALTA) 30 MG capsule Take 3 capsules (90 mg total) by mouth daily. 90 capsule 0  . esomeprazole (NEXIUM) 40 MG capsule TAKE 1 CAPSULE (40 MG TOTAL) BY MOUTH DAILY. 90 capsule 3  . furosemide (LASIX) 40 MG tablet TAKE 1 TABLET EVERY DAY 90 tablet 1  . gabapentin (NEURONTIN) 400 MG capsule Take 1 capsule (400 mg total) by mouth 3 (three) times daily. 270 capsule 0  . hydrOXYzine (ATARAX/VISTARIL) 50 MG tablet Take 2 tablets (100 mg total) by mouth at bedtime. May repeat in 6 hours if needed. 120 tablet 0  . ipratropium (ATROVENT) 0.03 % nasal spray Place 2 sprays into the nose 4 (four) times daily. 90 mL 0  . levothyroxine (SYNTHROID, LEVOTHROID) 50 MCG tablet TAKE 1 TABLET EVERY DAY 90 tablet 1  . lidocaine (LIDODERM) 5 % PLACE 1 PATCH ONTO THE SKIN DAILY. REMOVE & DISCARD PATCH WITHIN 12 HOURS OR AS DIRECTED BY MD  11  . LORazepam (ATIVAN) 1 MG tablet Take 1 tablet  (1 mg total) by mouth at bedtime as needed for anxiety. 30 tablet 0  . ondansetron (ZOFRAN) 4 MG tablet Take 1 tablet (4 mg total) by mouth every 8 (eight) hours as needed for nausea or vomiting. 20 tablet 0  . potassium chloride SA (K-DUR,KLOR-CON) 20 MEQ tablet TAKE 1 TABLET (20 MEQ TOTAL) BY MOUTH 2 (TWO) TIMES DAILY. 180 tablet 1  . QUEtiapine (SEROQUEL) 25 MG tablet Take 1 tablet (25 mg total) by mouth at bedtime. 30 tablet 0  . rizatriptan (MAXALT) 10 MG tablet Take 0.5 tablets (5 mg total) by mouth as needed for migraine. May repeat in 2 hours if needed 10 tablet 5  . sucralfate (CARAFATE) 1 g tablet TAKE 1 TABLET FOUR TIMES DAILY WITH MEALS AND AT BEDTIME. 360 tablet 1  . topiramate (TOPAMAX) 100 MG tablet TAKE 1/2 TABLET  IN THE MORNING AND 1 TABLET AT BEDTIME 135 tablet 1   No current facility-administered medications on file prior to visit.    Allergies  Allergen Reactions  . Tramadol Nausea And Vomiting  . Trazodone And Nefazodone Other (See Comments)    Per pt trazodone caused hallucinations and behavior changes    Family History  Problem Relation Age of Onset  . Diabetes Mother   . Heart disease Mother   . Kidney disease Mother   . Thyroid disease Mother   . Heart disease Father   . Seizures Father   . COPD Father   . Cancer Maternal Grandmother    Social History   Social History  . Marital status: Married    Spouse name: N/A  . Number of children: N/A  . Years of education: N/A   Social History Main Topics  . Smoking status: Never Smoker  . Smokeless tobacco: Never Used  . Alcohol use No     Comment: 08-31-2016 PER PT NO  . Drug use: No     Comment: 08-31-2016 PER PT NO   . Sexual activity: Yes    Partners: Male     Comment: married   Other Topics Concern  . None   Social History Narrative  . None   Depression screen [facility] 2/9 09/09/2016 08/19/2016 08/05/2016 07/01/2016 06/23/2016  Decreased Interest 0 0 0 0 0  Down, Depressed, Hopeless 0 0 0 0 0  PHQ - 2  Score 0 0 0 0 0  Altered sleeping - - - - -  Tired, decreased energy - - - - -  Change in  appetite - - - - -  Feeling bad or failure about yourself  - - - - -  Trouble concentrating - - - - -  Moving slowly or fidgety/restless - - - - -  Suicidal thoughts - - - - -  PHQ-9 Score - - - - -  Difficult doing work/chores - - - - -    Review of Systems See hpi    Objective:   Physical Exam  Constitutional: She is oriented to person, place, and time. She appears well-developed and well-nourished. No distress.  HENT:  Head: Normocephalic and atraumatic.  Right Ear: External ear normal.  Left Ear: External ear normal.  Eyes: Conjunctivae are normal. No scleral icterus.  Neck: Normal range of motion. Neck supple. No thyromegaly present.  Cardiovascular: Normal rate, regular rhythm, normal heart sounds and intact distal pulses.   Pulmonary/Chest: Effort normal and breath sounds normal. No respiratory distress.  Musculoskeletal: She exhibits no edema.  Lymphadenopathy:    She has no cervical adenopathy.  Neurological: She is alert and oriented to person, place, and time.  Skin: Skin is warm and dry. She is not diaphoretic. No erythema.  Psychiatric: She has a normal mood and affect. Her behavior is normal.     BP 121/79   Pulse 94   Temp 97.8 F (36.6 C) (Oral)   Resp 18   Ht  (1.6 m)   Wt 212 lb (96.2 kg)   SpO2 94%   BMI 37.55 kg/m   Results for orders placed or performed in visit on 09/09/16  POCT urinalysis dipstick  Result Value Ref Range   Color, UA yellow yellow   Clarity, UA clear clear   Glucose, UA negative negative mg/dL   Bilirubin, UA negative negative   Ketones, POC UA negative negative mg/dL   Spec Grav, UA 1.610 9.604 - 1.025   Blood, UA negative negative   pH, UA 5.5 5.0 - 8.0   Protein Ur, POC negative negative mg/dL   Urobilinogen, UA 0.2 0.2 or 1.0 E.U./dL   Nitrite, UA Negative Negative   Leukocytes, UA Negative Negative       Assessment &  Plan:   1. Seizure-like activity (HCC)   2. Stage 3 chronic kidney disease   3. Essential hypertension   4. Hypothyroidism, unspecified type   5. Neuropathy of right lower extremity   6. Severe recurrent major depressive disorder with psychotic features (HCC)   7. Encounter for long-term (current) use of medications   8. Psychophysiological insomnia   9. Elevated C-reactive protein   10. Sinus congestion   11. Eustachian tube disorder, bilateral     Orders Placed This Encounter  Procedures  . Comprehensive metabolic panel  . Sedimentation Rate  . C-reactive protein  . ANA w/Reflex if Positive  . Magnesium  . Comprehensive metabolic panel  . POCT urinalysis dipstick    Meds ordered this encounter  Medications  . ALPRAZolam (XANAX) 1 MG tablet    Sig: Take 1 mg by mouth at bedtime as needed for anxiety.  . fluticasone (FLONASE) 50 MCG/ACT nasal spray    Sig: Place 2 sprays into both nostrils at bedtime.    Dispense:  16 g    Refill:  2  . azelastine (ASTELIN) 0.1 % nasal spray    Sig: Place 1 spray into both nostrils 2 (two) times daily. Use in each nostril as directed    Dispense:  30 mL    Refill:  12  .  Guaifenesin (MUCINEX MAXIMUM STRENGTH) 1200 MG TB12    Sig: Take 1 tablet (1,200 mg total) by mouth every 12 (twelve) hours as needed.    Dispense:  14 tablet    Refill:  1      Norberto Sorenson, M.D.  Primary Care at [facility] [address] [place], [place] [address] [phone] phone [phone] fax  09/28/16 3:15 AM

## 2016-09-10 ENCOUNTER — Telehealth (HOSPITAL_COMMUNITY): Payer: Self-pay | Admitting: *Deleted

## 2016-09-10 LAB — ANA W/REFLEX IF POSITIVE: Anti Nuclear Antibody(ANA): NEGATIVE

## 2016-09-10 LAB — COMPREHENSIVE METABOLIC PANEL
ALBUMIN: 4.4 g/dL (ref 3.6–4.8)
ALT: 22 IU/L (ref 0–32)
AST: 17 IU/L (ref 0–40)
Albumin/Globulin Ratio: 1.6 (ref 1.2–2.2)
Alkaline Phosphatase: 95 IU/L (ref 39–117)
BUN / CREAT RATIO: 12 (ref 12–28)
BUN: 19 mg/dL (ref 8–27)
Bilirubin Total: 0.2 mg/dL (ref 0.0–1.2)
CALCIUM: 10 mg/dL (ref 8.7–10.3)
CO2: 28 mmol/L (ref 18–29)
CREATININE: 1.62 mg/dL — AB (ref 0.57–1.00)
Chloride: 96 mmol/L (ref 96–106)
GFR calc Af Amer: 39 mL/min/{1.73_m2} — ABNORMAL LOW (ref >59)
GFR, EST NON AFRICAN AMERICAN: 34 mL/min/{1.73_m2} — AB (ref >59)
GLOBULIN, TOTAL: 2.7 g/dL (ref 1.5–4.5)
GLUCOSE: 120 mg/dL — AB (ref 65–99)
Potassium: 4.9 mmol/L (ref 3.5–5.2)
SODIUM: 141 mmol/L (ref 134–144)
Total Protein: 7.1 g/dL (ref 6.0–8.5)

## 2016-09-10 LAB — C-REACTIVE PROTEIN: CRP: 9.2 mg/L — AB (ref 0.0–4.9)

## 2016-09-10 LAB — SEDIMENTATION RATE: Sed Rate: 7 mm/hr (ref 0–40)

## 2016-09-10 LAB — MAGNESIUM: Magnesium: 2.2 mg/dL (ref 1.6–2.3)

## 2016-09-10 NOTE — Telephone Encounter (Signed)
Office received approval letter from [facility] for pt Duloxetine HCL DR 30 mg Cap for 90/30. Per letter, the authorization is good until 09-04-2017. [employer] customer service number is [phone]. Fax do not ref number. Letter will be sent to scan center.

## 2016-09-11 ENCOUNTER — Other Ambulatory Visit: Payer: Self-pay | Admitting: Family Medicine

## 2016-09-13 ENCOUNTER — Telehealth (HOSPITAL_COMMUNITY): Payer: Self-pay | Admitting: *Deleted

## 2016-09-13 ENCOUNTER — Other Ambulatory Visit: Payer: Self-pay | Admitting: Family Medicine

## 2016-09-13 NOTE — Telephone Encounter (Signed)
Called pt to get more information and was unable to reach her. lmtcb and number provided.

## 2016-09-13 NOTE — Telephone Encounter (Signed)
Pt pharmacy faxed refill request for pt Zoloft 100 mg BID, Per patient chart, this medication is not of pt current list of meds. Per pharmacy request, Pt is asking for 30 days supply with quantity of 60 for 30. Pharmacy would like new script to be sent in if pt is taking this medication. Pharmacy number is [phone].

## 2016-09-13 NOTE — Telephone Encounter (Signed)
I wonder if the patient meant for duloxetine? I will not prescribe sertraline as that is not my order.

## 2016-09-14 ENCOUNTER — Encounter: Payer: Self-pay | Admitting: Family Medicine

## 2016-09-14 ENCOUNTER — Telehealth (HOSPITAL_COMMUNITY): Payer: Self-pay | Admitting: *Deleted

## 2016-09-14 ENCOUNTER — Telehealth: Payer: Self-pay

## 2016-09-14 ENCOUNTER — Other Ambulatory Visit: Payer: Self-pay | Admitting: Family Medicine

## 2016-09-14 ENCOUNTER — Ambulatory Visit (INDEPENDENT_AMBULATORY_CARE_PROVIDER_SITE_OTHER): Payer: Medicare HMO | Admitting: Neurology

## 2016-09-14 ENCOUNTER — Other Ambulatory Visit: Payer: Self-pay | Admitting: Neurology

## 2016-09-14 ENCOUNTER — Encounter: Payer: Self-pay | Admitting: Neurology

## 2016-09-14 VITALS — BP 142/80 | HR 110 | Temp 98.4°F | Ht 63.0 in | Wt 213.0 lb

## 2016-09-14 DIAGNOSIS — G43709 Chronic migraine without aura, not intractable, without status migrainosus: Secondary | ICD-10-CM | POA: Diagnosis not present

## 2016-09-14 DIAGNOSIS — IMO0002 Reserved for concepts with insufficient information to code with codable children: Secondary | ICD-10-CM

## 2016-09-14 DIAGNOSIS — G9340 Encephalopathy, unspecified: Secondary | ICD-10-CM

## 2016-09-14 DIAGNOSIS — R569 Unspecified convulsions: Secondary | ICD-10-CM | POA: Insufficient documentation

## 2016-09-14 MED ORDER — PROPRANOLOL HCL 20 MG PO TABS: ORAL_TABLET | ORAL | 6 refills | Status: DC

## 2016-09-14 NOTE — Patient Instructions (Signed)
1. Schedule 48-hour EEG 2. Start Propranolol  at bedtime 3. Continue all your current medications 4. As per [place] driving laws, no driving until 6 months event-free 5. Follow-up in 2 months, call for any changes

## 2016-09-14 NOTE — Telephone Encounter (Signed)
lmtcb

## 2016-09-14 NOTE — Progress Notes (Signed)
NEUROLOGY CONSULTATION NOTE  Jennifer Davidson MRN: [id number] DOB: [birth year]  Referring provider: Dr. Norberto Sorenson Primary care provider: Dr. Norberto Sorenson  Reason for consult:  seizures  Dear Dr [name]:  Thank you for your kind referral of Jennifer Davidson for consultation of the above symptoms. Although her history is well known to you, please allow me to reiterate it for the purpose of our medical record. The patient was accompanied to the clinic by her husband who also provides collateral information. Records and images were personally reviewed where available.  HISTORY OF PRESENT ILLNESS: This is a pleasant 61 year old right-handed woman with a history of hypertension, hypothyroidism, chronic back pain, chronic kidney disease, depression, anxiety, migraines, presenting for evaluation of new onset seizure-like episodes. She and her husband report symptoms started 3 months ago, she was in a restaurant when the left side of her face suddenly started jerking, then gradually went down her arm and leg. Her husband describes her left leg would be extended. She had difficulty talking because she could not move her mouth. She was having these episodes 1-2 times a week, but over the past few weeks, they had increased, sometimes having 2 in one day. She had one on Friday, then 2 on Saturday. She would have some numbness on her left cheek and leg after the episodes, denies any focal weakness but she feels overall drained. Her husband reports that the episodes last 30-60 seconds, and with the event on Saturday, both arms were flexed at the elbow with her fists clenched, she bit the whole side of her mouth on the left. They report tongue bite in the past, no urinary incontinence. She has fallen and bruised her face with one of them. Her husband states prior to an episode, she would have a stare or appear fixed on something, "something is not right," he would call her or tap her and she would look at him. He has noticed that  they occur when she gets more anxious or really upset, "she worries too much." He made a remark last Saturday about cutting a tree down and she got very mad, then had a bad episode. Because these seemed to be a trigger, she was given a prescription for Valium last month, which seems to help. Her husband tries to get her mouth open to put the medication under her tongue. It has also been helping her sleep better. She has a history of insomnia and would only sleep for 1-2 hours, sometimes going 2-3 days without sleep. She would get delirious and talk to people who are not there. She had previously been taking Xanax, but they report this was stopped suddenly last week and switched to Ativan, which has not been helping her sleep.   She denies any olfactory/gustatory hallucinations, deja vu, rising epigastric sensation, myoclonic jerks. She has a history of migraines since age 1, Topamax dose increased to  in AM,  in PM 2 months ago. Migraines start in the occipital region and radiate to the vertex, with sharp stabbing pain, nausea, vomiting, photo and phonophobia. They occur around 4-5 times a week lasting all day. She takes Excedrin migraine or Tylenol ES 4-5 times a week, and if ineffective would take Maxalt. She has been taking Gabapentin  TID since 2012 for back pain. She has occasional dizziness. She denies any diplopia, dysarthria/dysphagia, bowel/bladder dysfunction. She reports her father had seizures that were "not epilepsy." Her paternal aunt had seizures ("same type he had"). Otherwise she  had a normal birth and early development.  There is no history of febrile convulsions, CNS infections such as meningitis/encephalitis, significant traumatic brain injury, neurosurgical procedures.  I personally reviewed MRI brain without contrast done 08/09/16 which did not show any acute changes, reported as negative. On my review, it may be due to positioning, but there appears to be some asymmetry in  the hippocampi with changes seen on the right side. She had an EEG on 09/01/16, awaiting results.  PAST MEDICAL HISTORY: Past Medical History:  Diagnosis Date  . Anxiety   . Cholesterol serum increased   . Chronic back pain   . Chronic kidney disease   . Depression   . Diastolic heart failure (HCC)   . GERD (gastroesophageal reflux disease)   . Hyperparathyroidism (HCC)   . Hypertension   . Hypothyroidism   . Migraine   . Neuromuscular disorder (HCC)   . Sleep apnea   . Tachycardia     PAST SURGICAL HISTORY: Past Surgical History:  Procedure Laterality Date  . ABDOMINAL HYSTERECTOMY    . APPENDECTOMY    . BACK SURGERY    . KNEE SURGERY      MEDICATIONS: Current Outpatient Prescriptions on File Prior to Visit  Medication Sig Dispense Refill  . ALPRAZolam (XANAX) 1 MG tablet Take 1 mg by mouth at bedtime as needed for anxiety.    Marland Kitchen azelastine (ASTELIN) 0.1 % nasal spray Place 1 spray into both nostrils 2 (two) times daily. Use in each nostril as directed 30 mL 12  . Calcium-Magnesium-Vitamin D (CALCIUM 1200+D3 PO) Take 1 tablet by mouth daily. Contains 800iu Vitamin D3    . cholecalciferol (VITAMIN D) 1000 UNITS tablet Take 1 tablet (1,000 Units total) by mouth daily at 12 noon. 30 tablet 0  . diazepam (VALIUM) 5 MG/ML solution Take 1 mL (5 mg total) by mouth every 8 (eight) hours as needed for anxiety (seizure). 30 mL 0  . Diclofenac Sodium 3 % GEL Place 1 application onto the skin 3 (three) times daily. Onto right knee 100 g 2  . dicyclomine (BENTYL) 20 MG tablet TAKE 1 TABLET FOUR TIMES A DAY BEFORE MEALS AND AT BEDTIME AS NEEDED FOR CRAMPING IRRITABLE BOWELS 180 tablet 0  . diphenoxylate-atropine (LOMOTIL) 2.5-0.025 MG tablet TAKE 1 TABLET BY MOUTH 4 TIMES A DAY AS NEEDED FOR DIARRHEA 30 tablet 2  . DULoxetine (CYMBALTA) 30 MG capsule Take 3 capsules (90 mg total) by mouth daily. 90 capsule 0  . esomeprazole (NEXIUM) 40 MG capsule TAKE 1 CAPSULE (40 MG TOTAL) BY MOUTH  DAILY. 90 capsule 3  . fluticasone (FLONASE) 50 MCG/ACT nasal spray Place 2 sprays into both nostrils at bedtime. 16 g 2  . furosemide (LASIX) 40 MG tablet TAKE 1 TABLET EVERY DAY 90 tablet 1  . gabapentin (NEURONTIN) 400 MG capsule Take 1 capsule (400 mg total) by mouth 3 (three) times daily. 270 capsule 0  . Guaifenesin (MUCINEX MAXIMUM STRENGTH) 1200 MG TB12 Take 1 tablet (1,200 mg total) by mouth every 12 (twelve) hours as needed. 14 tablet 1  . hydrOXYzine (ATARAX/VISTARIL) 50 MG tablet Take 2 tablets (100 mg total) by mouth at bedtime. May repeat in 6 hours if needed. 120 tablet 0  . ipratropium (ATROVENT) 0.03 % nasal spray Place 2 sprays into the nose 4 (four) times daily. 90 mL 0  . levothyroxine (SYNTHROID, LEVOTHROID) 50 MCG tablet TAKE 1 TABLET EVERY DAY 90 tablet 1  . lidocaine (LIDODERM) 5 % PLACE 1 PATCH  ONTO THE SKIN DAILY. REMOVE & DISCARD PATCH WITHIN 12 HOURS OR AS DIRECTED BY MD  11  . LORazepam (ATIVAN) 1 MG tablet Take 1 tablet (1 mg total) by mouth at bedtime as needed for anxiety. 30 tablet 0  . ondansetron (ZOFRAN) 4 MG tablet Take 1 tablet (4 mg total) by mouth every 8 (eight) hours as needed for nausea or vomiting. 20 tablet 0  . potassium chloride SA (K-DUR,KLOR-CON) 20 MEQ tablet TAKE 1 TABLET (20 MEQ TOTAL) BY MOUTH 2 (TWO) TIMES DAILY. 180 tablet 1  . QUEtiapine (SEROQUEL) 25 MG tablet Take 1 tablet (25 mg total) by mouth at bedtime. 30 tablet 0  . rizatriptan (MAXALT) 10 MG tablet Take 0.5 tablets (5 mg total) by mouth as needed for migraine. May repeat in 2 hours if needed 10 tablet 5  . sucralfate (CARAFATE) 1 g tablet TAKE 1 TABLET FOUR TIMES DAILY WITH MEALS AND AT BEDTIME. 360 tablet 1  . topiramate (TOPAMAX) 100 MG tablet TAKE 1/2 TABLET  IN THE MORNING AND 1 TABLET AT BEDTIME 135 tablet 1   No current facility-administered medications on file prior to visit.     ALLERGIES: Allergies  Allergen Reactions  . Tramadol Nausea And Vomiting  . Trazodone And  Nefazodone Other (See Comments)    Per pt trazodone caused hallucinations and behavior changes     FAMILY HISTORY: Family History  Problem Relation Age of Onset  . Diabetes Mother   . Heart disease Mother   . Kidney disease Mother   . Thyroid disease Mother   . Heart disease Father   . Seizures Father   . COPD Father   . Cancer Maternal Grandmother     SOCIAL HISTORY: Social History   Social History  . Marital status: Married    Spouse name: N/A  . Number of children: N/A  . Years of education: N/A   Occupational History  . Not on file.   Social History Main Topics  . Smoking status: Never Smoker  . Smokeless tobacco: Never Used  . Alcohol use No     Comment: 08-31-2016 PER PT NO  . Drug use: No     Comment: 08-31-2016 PER PT NO   . Sexual activity: Yes    Partners: Male     Comment: married   Other Topics Concern  . Not on file   Social History Narrative  . No narrative on file    REVIEW OF SYSTEMS: Constitutional: No fevers, chills, or sweats, no generalized fatigue, change in appetite Eyes: No visual changes, double vision, eye pain Ear, nose and throat: No hearing loss, ear pain, nasal congestion, sore throat Cardiovascular: No chest pain, palpitations Respiratory:  No shortness of breath at rest or with exertion, wheezes GastrointestinaI: No nausea, vomiting, diarrhea, abdominal pain, fecal incontinence Genitourinary:  No dysuria, urinary retention or frequency Musculoskeletal:  + neck pain, back pain Integumentary: No rash, pruritus, skin lesions Neurological: as above Psychiatric:+ depression, insomnia, anxiety Endocrine: No palpitations, fatigue, diaphoresis, mood swings, change in appetite, change in weight, increased thirst Hematologic/Lymphatic:  No anemia, purpura, petechiae. Allergic/Immunologic: no itchy/runny eyes, nasal congestion, recent allergic reactions, rashes  PHYSICAL EXAM: Vitals:   09/14/16 0900  BP: (!) 142/80  Pulse: (!) 110   Temp: 98.4 F (36.9 C)   General: No acute distress Head:  Normocephalic/atraumatic Eyes: Fundoscopic exam shows bilateral sharp discs, no vessel changes, exudates, or hemorrhages Neck: supple, no paraspinal tenderness, full range of motion Back: No paraspinal tenderness  Heart: regular rate and rhythm Lungs: Clear to auscultation bilaterally. Vascular: No carotid bruits. Skin/Extremities: No rash, no edema Neurological Exam: Mental status: alert and oriented to person, place, and time, no dysarthria or aphasia, Fund of knowledge is appropriate.  Recent and remote memory are intact.  Attention and concentration are normal.    Able to name objects and repeat phrases. Cranial nerves: CN I: not tested CN II: pupils equal, round and reactive to light, visual fields intact, fundi unremarkable. CN III, IV, VI:  full range of motion, no nystagmus, no ptosis CN V: decreased cold and pin on left V1-2, did not split midline with tuning fork or pin CN VII: upper and lower face symmetric CN VIII: hearing intact to finger rub CN IX, X: gag intact, uvula midline CN XI: sternocleidomastoid and trapezius muscles intact CN XII: tongue midline Bulk & Tone: normal, no fasciculations. Motor: 5/5 throughout with no pronator drift. Sensation: decreased cold and pin on left UE, intact on both LE, intact vibration and joint position sense. Romberg test negative Deep Tendon Reflexes: +2 throughout, no ankle clonus Plantar responses: downgoing bilaterally Cerebellar: no incoordination on finger to nose, heel to shin. No dysdiadochokinesia Gait: slow and cautious favoring right leg due to pain in hip and knee, difficulty with tandem walk due to pain Tremor: none  IMPRESSION: This is a 61 year old right-handed woman with a history of  hypertension, hypothyroidism, chronic back pain, chronic kidney disease, depression, anxiety, migraines, presenting for evaluation of new onset seizure-like episodes. Her  husband describes staring off followed by left facial jerking that marches down her left side, suggestive of Jacksonian march seen with focal motor seizures, however there appears to be an emotional component with these episodes occurring when she is stressed out, raising the possibility of psychogenic non-epileptic events (PNES). We discussed different seizure types. She is taking Topamax and Gabapentin for other indications (migraine and back pain), which are also anti-epileptic medications. MRI brain reported as normal. Awaiting EEG results. We discussed that if EEG normal, a 48-hour EEG will be ordered to further classify these episodes. She has a history of chronic kidney disease, agree with caution on increasing Topamax dose. We discussed adding on another medication for migraine prophylaxis, she agrees to start low dose Propranolol  qhs, side effects were discussed. This may help with anxiety as well. Continue follow-up with Psychiatry, she was encouraged to proceed with psychotherapy.  [place] driving laws were discussed with the patient, and she knows to stop driving after a seizure, until 6 months seizure-free. She will follow-up in 2 months and knows to call for any changes.   Thank you for allowing me to participate in the care of this patient. Please do not hesitate to call for any questions or concerns.   Patrcia Dolly, M.D.  CC: Dr. [name]

## 2016-09-14 NOTE — Telephone Encounter (Signed)
Thank you :)

## 2016-09-14 NOTE — Telephone Encounter (Signed)
Spoke with pt and she stated she did receive her Duloxetine. Per pt she does not take Zoloft. Per pt she is fine with refills for now.

## 2016-09-14 NOTE — Telephone Encounter (Signed)
patient returned phone call to [facility] yesterday at 4:19 p.m.

## 2016-09-14 NOTE — Telephone Encounter (Signed)
[facility] to [facility] 

## 2016-09-14 NOTE — Telephone Encounter (Signed)
Looking for a refill, she states Dr [name] rxs. I dont see on med list  Diazepam impensol liquid  /ml  1ml before seizure activity ( aura)  quanity 25ml  [facility] [place]

## 2016-09-16 ENCOUNTER — Telehealth: Payer: Self-pay | Admitting: Family Medicine

## 2016-09-16 ENCOUNTER — Telehealth: Payer: Self-pay | Admitting: Neurology

## 2016-09-16 NOTE — Telephone Encounter (Signed)
Filling the diazepam liquid concentrate so has been using 1 dose a day. Also has xanax ER 1mg  from our office.  Advised ok to cancel the lorazepam from Dr. Vanetta ShawlHisada - pt stated she didn't like it and wasn't taking it and the pharmacy is appropriately concerned about having 3 bzd active on file

## 2016-09-16 NOTE — Telephone Encounter (Signed)
[facility] is calling to discuss with dr Clelia Croftshaw the diazepam intensol solution and that the patient also has 2 more prescriptions similar from dr Clelia Croftshaw and a different doctor and they would like to confirm what she should be taking  Best number [phone]

## 2016-09-16 NOTE — Telephone Encounter (Signed)
Please advise. Medication record indicates prescription sent in for Diazepam 5mg /mL. Prescription sent on 09/14/16

## 2016-09-16 NOTE — Telephone Encounter (Signed)
error 

## 2016-09-16 NOTE — Procedures (Signed)
ELECTROENCEPHALOGRAM REPORT  Date of Study: 09/01/2016  Patient's Name: Jennifer Davidson MRN: [id number] Date of Birth: 05-24-1955  Referring Provider: Dr. Norberto SorensonEva Shaw  Clinical History: This is a 61 year old woman with new onset episodes of left facial twitching that goes down her left side.  Medications: Topamax Neurontin Xanax Cymbalta Lasix Vistaril Synthroid  Technical Summary: A multichannel digital EEG recording measured by the international 10-20 system with electrodes applied with paste and impedances below 5000 ohms performed in our laboratory with EKG monitoring in an awake and asleep patient.  Hyperventilation and photic stimulation were performed.  The digital EEG was referentially recorded, reformatted, and digitally filtered in a variety of bipolar and referential montages for optimal display.    Description: The patient is awake and asleep during the recording.  During maximal wakefulness, there is a symmetric, medium voltage 10-11 Hz posterior dominant rhythm that attenuates with eye opening.  The record is symmetric.  During drowsiness and sleep, there is an increase in theta slowing of the background.  Vertex waves and symmetric sleep spindles were seen.  Hyperventilation and photic stimulation did not elicit any abnormalities.  There were no epileptiform discharges or electrographic seizures seen.    EKG lead was unremarkable.  Impression: This awake and asleep EEG is normal.    Clinical Correlation: A normal EEG does not exclude a clinical diagnosis of epilepsy.  If further clinical questions remain, prolonged EEG may be helpful.  Clinical correlation is advised.   Patrcia DollyKaren Kiyona Mcnall, M.D.

## 2016-09-20 ENCOUNTER — Other Ambulatory Visit: Payer: Self-pay | Admitting: Neurology

## 2016-09-20 ENCOUNTER — Telehealth: Payer: Self-pay | Admitting: Neurology

## 2016-09-20 DIAGNOSIS — G40909 Epilepsy, unspecified, not intractable, without status epilepticus: Secondary | ICD-10-CM

## 2016-09-20 NOTE — Telephone Encounter (Signed)
Pt returned my call.  Relayed message below.  Pt says that Darl PikesSusan had already called her a scheduled her 48 hour EEG for Monday.

## 2016-09-20 NOTE — Telephone Encounter (Signed)
LMOM asking pt to return call to the office for results.

## 2016-09-20 NOTE — Telephone Encounter (Signed)
-----   Message from Van ClinesKaren M Aquino, MD sent at 09/16/2016 12:14 PM EDT ----- Regarding: EEG results Pls let her know I reviewed the EEG done last week, it is normal, proceed with the ambulatory EEG as discussed. Thanks

## 2016-09-27 ENCOUNTER — Ambulatory Visit (INDEPENDENT_AMBULATORY_CARE_PROVIDER_SITE_OTHER): Payer: Medicare HMO | Admitting: Neurology

## 2016-09-27 ENCOUNTER — Other Ambulatory Visit: Payer: Self-pay | Admitting: Family Medicine

## 2016-09-27 DIAGNOSIS — G9340 Encephalopathy, unspecified: Secondary | ICD-10-CM | POA: Diagnosis not present
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
## 2016-09-28 DIAGNOSIS — M25561 Pain in right knee: Secondary | ICD-10-CM | POA: Diagnosis not present

## 2016-09-29 DIAGNOSIS — G9340 Encephalopathy, unspecified: Secondary | ICD-10-CM

## 2016-09-30 ENCOUNTER — Encounter: Payer: Self-pay | Admitting: Family Medicine

## 2016-09-30 ENCOUNTER — Ambulatory Visit (INDEPENDENT_AMBULATORY_CARE_PROVIDER_SITE_OTHER): Payer: Medicare HMO | Admitting: Family Medicine

## 2016-09-30 ENCOUNTER — Ambulatory Visit (HOSPITAL_COMMUNITY): Payer: Self-pay | Admitting: Psychiatry

## 2016-09-30 VITALS — BP 114/77 | HR 74 | Temp 98.4°F | Resp 16 | Ht 63.0 in | Wt 215.8 lb

## 2016-09-30 DIAGNOSIS — N183 Chronic kidney disease, stage 3 unspecified: Secondary | ICD-10-CM

## 2016-09-30 DIAGNOSIS — Z79899 Other long term (current) drug therapy: Secondary | ICD-10-CM | POA: Diagnosis not present

## 2016-09-30 DIAGNOSIS — R569 Unspecified convulsions: Secondary | ICD-10-CM

## 2016-09-30 DIAGNOSIS — F333 Major depressive disorder, recurrent, severe with psychotic symptoms: Secondary | ICD-10-CM

## 2016-09-30 LAB — POCT URINALYSIS DIP (MANUAL ENTRY)
Bilirubin, UA: NEGATIVE
GLUCOSE UA: NEGATIVE mg/dL
Ketones, POC UA: NEGATIVE mg/dL
NITRITE UA: NEGATIVE
PROTEIN UA: NEGATIVE mg/dL
RBC UA: NEGATIVE
SPEC GRAV UA: 1.01 (ref 1.010–1.025)
UROBILINOGEN UA: 0.2 U/dL
pH, UA: 6.5 (ref 5.0–8.0)

## 2016-09-30 MED ORDER — DIAZEPAM 5 MG PO TABS: 5.0000 mg | ORAL_TABLET | Freq: Two times a day (BID) | ORAL | 1 refills | Status: DC | PRN

## 2016-09-30 MED ORDER — DULOXETINE HCL 30 MG PO CPEP: 30.0000 mg | ORAL_CAPSULE | Freq: Three times a day (TID) | ORAL | 0 refills | Status: DC

## 2016-09-30 MED ORDER — DIPHENOXYLATE-ATROPINE 2.5-0.025 MG PO TABS: ORAL_TABLET | ORAL | 2 refills | Status: DC
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
## 2016-09-30 NOTE — Patient Instructions (Signed)
     IF you received an x-ray today, you will receive an invoice from [facility]. Please contact [facility] at [phone] with questions or concerns regarding your invoice.   IF you received labwork today, you will receive an invoice from [facility]. Please contact [facility] at [phone] with questions or concerns regarding your invoice.   Our billing staff will not be able to assist you with questions regarding bills from these companies.  You will be contacted with the lab results as soon as they are available. The fastest way to get your results is to activate your My Chart account. Instructions are located on the last page of this paperwork. If you have not heard from us regarding the results in 2 weeks, please contact this office.     

## 2016-10-04 ENCOUNTER — Encounter (HOSPITAL_COMMUNITY): Payer: Self-pay | Admitting: Psychiatry

## 2016-10-04 ENCOUNTER — Ambulatory Visit (INDEPENDENT_AMBULATORY_CARE_PROVIDER_SITE_OTHER): Payer: Medicare HMO | Admitting: Psychiatry

## 2016-10-04 VITALS — BP 98/65 | HR 75 | Ht 62.99 in | Wt 217.8 lb

## 2016-10-04 DIAGNOSIS — Z79899 Other long term (current) drug therapy: Secondary | ICD-10-CM | POA: Diagnosis not present

## 2016-10-04 DIAGNOSIS — G43909 Migraine, unspecified, not intractable, without status migrainosus: Secondary | ICD-10-CM

## 2016-10-04 DIAGNOSIS — I130 Hypertensive heart and chronic kidney disease with heart failure and stage 1 through stage 4 chronic kidney disease, or unspecified chronic kidney disease: Secondary | ICD-10-CM | POA: Diagnosis not present

## 2016-10-04 DIAGNOSIS — Z820 Family history of epilepsy and other diseases of the nervous system: Secondary | ICD-10-CM

## 2016-10-04 DIAGNOSIS — F323 Major depressive disorder, single episode, severe with psychotic features: Secondary | ICD-10-CM | POA: Diagnosis not present

## 2016-10-04 DIAGNOSIS — E039 Hypothyroidism, unspecified: Secondary | ICD-10-CM | POA: Diagnosis not present

## 2016-10-04 DIAGNOSIS — Z833 Family history of diabetes mellitus: Secondary | ICD-10-CM

## 2016-10-04 DIAGNOSIS — Z8249 Family history of ischemic heart disease and other diseases of the circulatory system: Secondary | ICD-10-CM

## 2016-10-04 DIAGNOSIS — K580 Irritable bowel syndrome with diarrhea: Secondary | ICD-10-CM

## 2016-10-04 DIAGNOSIS — N189 Chronic kidney disease, unspecified: Secondary | ICD-10-CM

## 2016-10-04 DIAGNOSIS — I5032 Chronic diastolic (congestive) heart failure: Secondary | ICD-10-CM | POA: Diagnosis not present

## 2016-10-04 DIAGNOSIS — Z809 Family history of malignant neoplasm, unspecified: Secondary | ICD-10-CM

## 2016-10-04 DIAGNOSIS — Z841 Family history of disorders of kidney and ureter: Secondary | ICD-10-CM

## 2016-10-04 DIAGNOSIS — F331 Major depressive disorder, recurrent, moderate: Secondary | ICD-10-CM

## 2016-10-04 DIAGNOSIS — G8929 Other chronic pain: Secondary | ICD-10-CM | POA: Diagnosis not present

## 2016-10-04 DIAGNOSIS — Z836 Family history of other diseases of the respiratory system: Secondary | ICD-10-CM

## 2016-10-04 DIAGNOSIS — Z888 Allergy status to other drugs, medicaments and biological substances status: Secondary | ICD-10-CM

## 2016-10-04 DIAGNOSIS — F4310 Post-traumatic stress disorder, unspecified: Secondary | ICD-10-CM

## 2016-10-04 DIAGNOSIS — Z8349 Family history of other endocrine, nutritional and metabolic diseases: Secondary | ICD-10-CM

## 2016-10-04 MED ORDER — DULOXETINE HCL 60 MG PO CPEP: 60.0000 mg | ORAL_CAPSULE | Freq: Two times a day (BID) | ORAL | 1 refills | Status: DC

## 2016-10-04 MED ORDER — QUETIAPINE FUMARATE 25 MG PO TABS: 25.0000 mg | ORAL_TABLET | Freq: Every day | ORAL | 1 refills | Status: DC
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
## 2016-10-04 NOTE — Patient Instructions (Addendum)
1. Increase duloxetine 60 mg twice a day  2. Continue quetiapine 25 mg at night 3. Discontinue Xanax, Ativan 4. Continue to take Valium 2.5-5 mg twice a day as needed for anxiety 5. Contact for therapy: Dr. Daisy BlossomSara W Schneidmiller  [phone] [address], [place], [place] [id number]  6. Return to clinic in one month for 30 mins

## 2016-10-05 ENCOUNTER — Other Ambulatory Visit: Payer: Self-pay | Admitting: Emergency Medicine

## 2016-10-12 ENCOUNTER — Other Ambulatory Visit: Payer: Self-pay

## 2016-10-12 ENCOUNTER — Ambulatory Visit: Payer: Self-pay | Admitting: Neurology

## 2016-10-12 NOTE — Telephone Encounter (Signed)
09/30/16 last refill locally

## 2016-10-12 NOTE — Procedures (Signed)
ELECTROENCEPHALOGRAM REPORT  Dates of Recording: 09/27/2016 10:17AM to 09/29/2016 09:26AM  Patient's Name: Jennifer Davidson MRN: [id number] Date of Birth: Apr 09, 1956  Referring Provider: Dr. Patrcia DollyKaren Aquino  Procedure: 48-hour ambulatory EEG  History: This is a 61 year old woman with recurrent episodes of left-sided tingling, shaking  Medications:  Topamax, Xanax, Astelin, Valium, Cymbalta, Lasix, Vistaril, Synthroid, Seroquel  Technical Summary: This is a 48-hour multichannel digital EEG recording measured by the international 10-20 system with electrodes applied with paste and impedances below 5000 ohms performed as portable with EKG monitoring.  The digital EEG was referentially recorded, reformatted, and digitally filtered in a variety of bipolar and referential montages for optimal display.    DESCRIPTION OF RECORDING: During maximal wakefulness, the background activity consisted of a symmetric 8 Hz posterior dominant rhythm which was reactive to eye opening.  There were no epileptiform discharges or focal slowing seen in wakefulness.  During the recording, the patient progresses through wakefulness, drowsiness, and Stage 2 sleep.  Again, there were no epileptiform discharges seen.  Events: On 05/14 at 1555 hours, she reports a seizure. She is not on video at this time. Electrographically, there were no EEG or EKG changes seen.  On 05/15 at 0135 hours, she reports a ?seizure. She is not on video. Electrographically, there were no EEG or EKG changes seen.  On 05/15 at 1915 hours, she reports a bad seizure. She is seen lying on the bed and appears to be grimacing. Electrographically, there were no EEG or EKG changes seen.  There were no electrographic seizures seen.  EKG lead was unremarkable.  IMPRESSION: This 48-hour ambulatory EEG study is normal.  There were 3 seizures reported with no associated epileptiform correlate.  CLINICAL CORRELATION: Patient reported seizures with no  epileptiform correlate seen, indicating non-epileptic events. If further clinical questions remain, inpatient video EEG monitoring may be helpful.   Patrcia DollyKaren Aquino, M.D.

## 2016-10-13 NOTE — Telephone Encounter (Signed)
Pt has a visit tomorrow. Will address then.

## 2016-10-13 NOTE — Progress Notes (Signed)
Subjective:    Patient ID: Jennifer Davidson, female    DOB: 1955/08/25, 61 y.o.   MRN: [id number]  Chief Complaint  Patient presents with  . Seizures    states they have almost stopped coming  . Follow-up  . Dizziness    x1 week; don't know the cause of it    HPI  Jennifer Davidson is a delightful 61 yo woman who has been having a progressive worsening of major depression and has over the past several months developed seizure like episodes at least several times a week and at times several times a day. Her symptoms have continued to worsen.  Can't see well with intermittent blurred vision/difficulty focusing for the past several months - has not had an eye exam in almost 2 years but did have prior lasik surgery with Dr. Gershon Crane. It is intermittent.   Felt like psych blamed her for not calling her daughter.  Mark felt guilty.  48 hr EEG was done anmd was normal.; Having HAs, nause, dizzy spells N/v/diarrhea stopped, no appetite Not sleeping - just passes out occ.   Passed out several days ago but was probably from exhaution Seizures have stopped and cheeks don't get red anymore since we started the diazpeam.   Taken off citalopram and wellbutrin and klonopin  Past Medical History:  Diagnosis Date  . Anxiety   . Cholesterol serum increased   . Chronic back pain   . Chronic kidney disease   . Depression   . Diastolic heart failure ([place])   . GERD (gastroesophageal reflux disease)   . Hyperparathyroidism ([place])   . Hypertension   . Hypothyroidism   . Migraine   . Neuromuscular disorder ([place])   . Sleep apnea   . Tachycardia    Past Surgical History:  Procedure Laterality Date  . ABDOMINAL HYSTERECTOMY    . APPENDECTOMY    . BACK SURGERY    . KNEE SURGERY     Current Outpatient Prescriptions on File Prior to Visit  Medication Sig Dispense Refill  . azelastine (ASTELIN) 0.1 % nasal spray Place 1 spray into both nostrils 2 (two) times daily. Use in each nostril as directed 30 mL 12  .  Calcium-Magnesium-Vitamin D (CALCIUM 1200+D3 PO) Take 1 tablet by mouth daily. Contains 800iu Vitamin D3    . cholecalciferol (VITAMIN D) 1000 UNITS tablet Take 1 tablet (1,000 Units total) by mouth daily at 12 noon. 30 tablet 0  . diazepam (VALIUM) 5 MG tablet Take 1 tablet (5 mg total) by mouth every 12 (twelve) hours as needed for anxiety. 60 tablet 1  . Diclofenac Sodium 3 % GEL Place 1 application onto the skin 3 (three) times daily. Onto right knee 100 g 2  . dicyclomine (BENTYL) 20 MG tablet TAKE 1 TABLET FOUR TIMES A DAY BEFORE MEALS AND AT BEDTIME AS NEEDED FOR CRAMPING IRRITABLE BOWELS 180 tablet 0  . diphenoxylate-atropine (LOMOTIL) 2.5-0.025 MG tablet TAKE 1 TABLET BY MOUTH 4 TIMES A DAY AS NEEDED FOR DIARRHEA 30 tablet 2  . DULoxetine (CYMBALTA) 60 MG capsule Take 1 capsule (60 mg total) by mouth 2 (two) times daily. 60 capsule 1  . esomeprazole (NEXIUM) 40 MG capsule TAKE 1 CAPSULE (40 MG TOTAL) BY MOUTH DAILY. 90 capsule 3  . fluticasone (FLONASE) 50 MCG/ACT nasal spray Place 2 sprays into both nostrils at bedtime. 16 g 2  . furosemide (LASIX) 40 MG tablet TAKE 1 TABLET EVERY DAY 90 tablet 1  . gabapentin (NEURONTIN) 400 MG  capsule Take 1 capsule (400 mg total) by mouth 3 (three) times daily. 270 capsule 0  . hydrOXYzine (ATARAX/VISTARIL) 50 MG tablet Take 2 tablets (100 mg total) by mouth at bedtime. May repeat in 6 hours if needed. 120 tablet 0  . ipratropium (ATROVENT) 0.03 % nasal spray Place 2 sprays into the nose 4 (four) times daily. 90 mL 0  . levothyroxine (SYNTHROID, LEVOTHROID) 50 MCG tablet TAKE 1 TABLET EVERY DAY 90 tablet 1  . lidocaine (LIDODERM) 5 % PLACE 1 PATCH ONTO THE SKIN DAILY. REMOVE & DISCARD PATCH WITHIN 12 HOURS OR AS DIRECTED BY MD  11  . ondansetron (ZOFRAN) 4 MG tablet TAKE 1 TABLET BY MOUTH EVERY 8 HOURS AS NEEDED FOR NAUSEA/VOMITING 20 tablet 0  . Potassium Chloride ER 20 MEQ TBCR TAKE 1 TABLET TWICE DAILY 180 tablet 0  . rizatriptan (MAXALT) 10 MG  tablet Take 0.5 tablets (5 mg total) by mouth as needed for migraine. May repeat in 2 hours if needed 10 tablet 5  . sucralfate (CARAFATE) 1 g tablet TAKE 1 TABLET FOUR TIMES DAILY WITH MEALS AND AT BEDTIME. 360 tablet 1  . topiramate (TOPAMAX) 100 MG tablet TAKE 1/2 TABLET  IN THE MORNING AND 1 TABLET AT BEDTIME 135 tablet 1  . Guaifenesin (MUCINEX MAXIMUM STRENGTH) 1200 MG TB12 Take 1 tablet (1,200 mg total) by mouth every 12 (twelve) hours as needed. (Patient not taking: Reported on 10/14/2016) 14 tablet 1   No current facility-administered medications on file prior to visit.    Allergies  Allergen Reactions  . Tramadol Nausea And Vomiting  . Trazodone And Nefazodone Other (See Comments)    Per pt trazodone caused hallucinations and behavior changes    Family History  Problem Relation Age of Onset  . Diabetes Mother   . Heart disease Mother   . Kidney disease Mother   . Thyroid disease Mother   . Heart disease Father   . Seizures Father   . COPD Father   . Cancer Maternal Grandmother    Social History   Social History  . Marital status: Married    Spouse name: N/A  . Number of children: N/A  . Years of education: N/A   Social History Main Topics  . Smoking status: Never Smoker  . Smokeless tobacco: Never Used  . Alcohol use No     Comment: 08-31-2016 PER PT NO  . Drug use: No     Comment: 08-31-2016 PER PT NO   . Sexual activity: Yes    Partners: Male     Comment: married   Other Topics Concern  . None   Social History Narrative  . None   Depression screen [facility] 2/9 10/14/2016 09/30/2016 09/09/2016 08/19/2016 08/05/2016  Decreased Interest 0 2 0 0 0  Down, Depressed, Hopeless 0 2 0 0 0  PHQ - 2 Score 0 4 0 0 0  Altered sleeping - 3 - - -  Tired, decreased energy - 3 - - -  Change in appetite - 2 - - -  Feeling bad or failure about yourself  - 3 - - -  Trouble concentrating - 3 - - -  Moving slowly or fidgety/restless - 2 - - -  Suicidal thoughts - 2 - - -  PHQ-9  Score - 22 - - -  Difficult doing work/chores - Somewhat difficult - - -    Review of Systems See hpi    Objective:   Physical Exam  Constitutional: She  is oriented to person, place, and time. She appears well-developed and well-nourished. No distress.  HENT:  Head: Normocephalic and atraumatic.  Right Ear: External ear normal.  Left Ear: External ear normal.  Eyes: Conjunctivae are normal. No scleral icterus.  Neck: Normal range of motion. Neck supple. No thyromegaly present.  Cardiovascular: Normal rate, regular rhythm, normal heart sounds and intact distal pulses.   Pulmonary/Chest: Effort normal and breath sounds normal. No respiratory distress.  Musculoskeletal: She exhibits no edema.  Lymphadenopathy:    She has no cervical adenopathy.  Neurological: She is alert and oriented to person, place, and time.  Skin: Skin is warm and dry. She is not diaphoretic. No erythema.  Psychiatric: Her speech is normal. Her affect is blunt. She is slowed. She does not express impulsivity or inappropriate judgment. She exhibits a depressed mood. She expresses no homicidal and no suicidal ideation. She expresses no suicidal plans and no homicidal plans. She exhibits abnormal recent memory. She exhibits normal remote memory.    BP 102/69   Pulse 79   Temp 97.8 F (36.6 C) (Oral)   Resp 18   Ht _0  (1.6 m)   Wt 219 lb (99.3 kg)   SpO2 95%   BMI 38.79 kg/m      Results for orders placed or performed in visit on 10/14/16  POCT urinalysis dipstick  Result Value Ref Range   Color, UA yellow yellow   Clarity, UA clear clear   Glucose, UA negative negative mg/dL   Bilirubin, UA negative negative   Ketones, POC UA negative negative mg/dL   Spec Grav, UA 1.010 1.010 - 1.025   Blood, UA negative negative   pH, UA 5.5 5.0 - 8.0   Protein Ur, POC negative negative mg/dL   Urobilinogen, UA 0.2 0.2 or 1.0 E.U./dL   Nitrite, UA Negative Negative   Leukocytes, UA Small (1+) (A) Negative     Assessment & Plan:  eGFR 34 I wonder if she would be a good candidate for TCMS? I put in a referral to [facility] in [place] so patient can at least go in for a free consultation. 1. Imbalance   2. Stage 3 chronic kidney disease   3. Recurrent UTI     Orders Placed This Encounter  Procedures  . POCT urinalysis dipstick    Meds ordered this encounter  Medications  . QUEtiapine (SEROQUEL) 25 MG tablet    Sig: Take 2 tablets (50 mg total) by mouth at bedtime.    Dispense:  30 tablet    Refill:  1  . ALPRAZolam (XANAX) 1 MG tablet    Sig: Take 1 tablet (1 mg total) by mouth at bedtime as needed for anxiety.    Dispense:  90 tablet    Refill:  0    Please fax to [facility]: [phone]      Delman Cheadle, M.D.  Primary Care at [facility] [address] [place], [place] [address] [phone] phone [phone] fax  10/16/16 10:37 PM

## 2016-10-14 ENCOUNTER — Telehealth: Payer: Self-pay

## 2016-10-14 ENCOUNTER — Encounter: Payer: Self-pay | Admitting: Family Medicine

## 2016-10-14 ENCOUNTER — Ambulatory Visit (INDEPENDENT_AMBULATORY_CARE_PROVIDER_SITE_OTHER): Payer: Medicare HMO | Admitting: Family Medicine

## 2016-10-14 VITALS — BP 102/69 | HR 79 | Temp 97.8°F | Resp 18 | Ht 63.0 in | Wt 219.0 lb

## 2016-10-14 DIAGNOSIS — N183 Chronic kidney disease, stage 3 unspecified: Secondary | ICD-10-CM

## 2016-10-14 DIAGNOSIS — N390 Urinary tract infection, site not specified: Secondary | ICD-10-CM

## 2016-10-14 DIAGNOSIS — R2689 Other abnormalities of gait and mobility: Secondary | ICD-10-CM

## 2016-10-14 LAB — POCT URINALYSIS DIP (MANUAL ENTRY)
BILIRUBIN UA: NEGATIVE mg/dL
Bilirubin, UA: NEGATIVE
GLUCOSE UA: NEGATIVE mg/dL
NITRITE UA: NEGATIVE
Protein Ur, POC: NEGATIVE mg/dL
RBC UA: NEGATIVE
Spec Grav, UA: 1.01 (ref 1.010–1.025)
Urobilinogen, UA: 0.2 E.U./dL
pH, UA: 5.5 (ref 5.0–8.0)

## 2016-10-14 MED ORDER — QUETIAPINE FUMARATE 25 MG PO TABS: 50.0000 mg | ORAL_TABLET | Freq: Every day | ORAL | 1 refills | Status: DC

## 2016-10-14 MED ORDER — ALPRAZOLAM 1 MG PO TABS: 1.0000 mg | ORAL_TABLET | Freq: Every evening | ORAL | 0 refills | Status: DC | PRN

## 2016-10-14 NOTE — Telephone Encounter (Signed)
Faxed order for Xanax to [facility]. Confirmation fax received.

## 2016-10-14 NOTE — Patient Instructions (Addendum)
Lets give the current medication changes about 2 weeks to settle in. In about 2 weeks if you want to try to eliminate 1 of the gabapentin doses or the morning Topamax dose (the 1/2 tab so just continue on the 100mg  at night) to see if that will help with fatigue and mood symptoms as well that would be fine. It is possible that the hydroxyzine is contributing to your blurred vision so if this continues even after the Cymbalta gets out of your system we may need to think about skipping for a while.  [facility] - They offer a free consultation when you call. [facility] - [place], [place]  [address]  [place]  [place]  [place]  [id number] & Contact Information  Hours of Operation: Monday to Friday - 8:30am to 5:30pm Extended and Weekend hours - By appointment only Phone: [phone] Fax: [phone] Email: [email or web address]      IF you received an x-ray today, you will receive an invoice from [facility]. Please contact [facility] at [phone] with questions or concerns regarding your invoice.   IF you received labwork today, you will receive an invoice from [facility]. Please contact [facility] at [phone] with questions or concerns regarding your invoice.   Our billing staff will not be able to assist you with questions regarding bills from these companies.  You will be contacted with the lab results as soon as they are available. The fastest way to get your results is to activate your My Chart account. Instructions are located on the last page of this paperwork. If you have not heard from [place] regarding the results in 2 weeks, please contact this office.      Repetitive Transcranial Magnetic Stimulation Repetitive transcranial magnetic stimulation (rTMS) is a type of brain stimulation therapy that is used to treat major depression. You may have this procedure if your depression has not  responded to medicine and other treatments. In rTMS, an insulated magnetic coil is held directly against your forehead. It delivers short electromagnetic pulses to specific areas of the brain responsible for mood regulation. These electric currents stimulate nerve cells in the targeted area. You may have multiple treatments of rTMS. Tell a health care provider about:  Any allergies you have.  All medicines you are taking, including vitamins, herbs, eye drops, creams, and over-the-counter medicines.  Any blood disorders you have.  Any surgeries you have had.  Any medical conditions you have.  Whether you are pregnant or may be pregnant.  Any metal you may have in your body, such as a cochlear implant, spinal cord stimulator, pacemaker, stents, plates or screws. What are the risks? Common side effects of treatment include:  A headache.  A feeling of lightheadedness.  Discomfort, twitching, or tingling in your scalp.  Discomfort in your jaw or cheek on the side of your face where you had the procedure.  Rare side effects include:  Seizures. These are more common in people who have epilepsy, a history of seizures, or are already taking certain medicines that can trigger seizures.  Experiencing periods of elation or irritability and high energy (mania). This is more common in people who have bipolar disorder.  Hearing loss. This can happen if you do not have good ear protection during treatment.  What happens before the procedure?  Ask your health care provider about changing or stopping your normal medicines. What happens during the procedure?  You will be seated in a  reclining chair.  You will be given earplugs to wear.  Your health care provider will use a machine to place an electromagnetic coil against your forehead.  Your health care provider will use a computer program to send electromagnetic pulses through the coil to specific areas of your brain.  You may hear  clicking sounds and feel tapping on your forehead.  Your health care provider may adjust or move the coil during your treatment session. The procedure may vary among health care providers and hospitals. What happens after the procedure?  Keep all follow-up visits as told by your health care provider. This is important. This information is not intended to replace advice given to you by your health care provider. Make sure you discuss any questions you have with your health care provider. Document Released: 09/23/2010 Document Revised: 10/09/2015 Document Reviewed: 11/20/2014 Elsevier Interactive Patient Education  2018 Elsevier Inc.  Repetitive Transcranial Magnetic Stimulation, Care After Refer to this sheet in the next few weeks. These instructions provide you with information about caring for yourself after your procedure. Your health care provider may also give you more specific instructions. Your treatment has been planned according to current medical practices, but problems sometimes occur. Call your health care provider if you have any problems or questions after your procedure. What can I expect after the procedure? After the procedure, it is common to have:  A headache.  Lightheadedness.  Mild discomfort, twitching, or tingling in your scalp.  Discomfort in your jaw or cheek on the side of your face where you had the procedure.  Follow these instructions at home:  Take over-the-counter and prescription medicines only as told by your health care provider. This may include pain medicines.  Keep all follow-up visits as told by your health care provider. This is important.  Watch for any symptoms of depression. Contact a health care provider if:  Your headache and scalp discomfort continue after your treatment series is done.  You have hearing loss or difficulty hearing.  Your depression continues or gets worse. Get help right away if:  You experience a period of elation  or irritability and high energy (mania).  You have serious thoughts about hurting yourself or others. This information is not intended to replace advice given to you by your health care provider. Make sure you discuss any questions you have with your health care provider. Document Released: 05/30/2015 Document Revised: 10/09/2015 Document Reviewed: 11/20/2014 Elsevier Interactive Patient Education  [employer]2018 Elsevier Inc.

## 2016-10-15 ENCOUNTER — Other Ambulatory Visit: Payer: Self-pay | Admitting: Family Medicine

## 2016-10-16 MED ORDER — QUETIAPINE FUMARATE 50 MG PO TABS: 50.0000 mg | ORAL_TABLET | Freq: Every day | ORAL | 0 refills | Status: DC

## 2016-10-16 NOTE — Telephone Encounter (Signed)
See 10/14/16

## 2016-10-18 ENCOUNTER — Other Ambulatory Visit: Payer: Self-pay | Admitting: Family Medicine

## 2016-10-20 ENCOUNTER — Telehealth: Payer: Self-pay | Admitting: Family Medicine

## 2016-10-20 NOTE — Telephone Encounter (Signed)
Pt is checking on status of refill request for quetiapine  Best number is [phone]

## 2016-10-21 NOTE — Telephone Encounter (Signed)
It was sent to mail order 10/16/16

## 2016-10-22 ENCOUNTER — Other Ambulatory Visit: Payer: Self-pay | Admitting: Family Medicine

## 2016-10-22 NOTE — Telephone Encounter (Signed)
Today I have utilized the [place] Controlled Substance Registry's online query to confirm compliance regarding the patient's controlled medications. Using [facility] [place].  My review reveals that she did have lomotil #30 (8d supply when WAY maxing it out) filled 1 mo ago and then refilled just 6d later.    Filled diazepam 5mg  tabs bid #60 5/18 and does have one refill so will need new rx around 7/17. Filled diazepam 5mg /ml liquid concentration #30 doses on 5/3.   [facility] [place] other than a rx for alprazolam 1mg  bid #180 (3 mo supply) which was [facility] most recently on 06/21/16 (written on 06/15/16) and there is still a refill available on this until 7/30.   We then faxed another rx for xanax to [facility] on 5/31 (see OV note from that day).

## 2016-10-25 ENCOUNTER — Telehealth (HOSPITAL_COMMUNITY): Payer: Self-pay | Admitting: *Deleted

## 2016-10-25 NOTE — Telephone Encounter (Signed)
returned phone call to patient regarding voice message to cancel her appointment for 11/01/16.   Patient's husband answered the phone and said his wife will not be coming back no time soon.   He said a lot of her medicines has been changed and it is not working well for her right now.   Her PC is trying to get her medicines straight.

## 2016-10-25 NOTE — Telephone Encounter (Signed)
returned phone call to patient regarding voice message to cancel her appointment for 11/01/16.   Patient's husband answered the phone and said his wife will not be coming back no time soon.   He said a lot of her medicines has been changed and it is not working well for her right now.   Her PC is trying to get her medicines straight. 
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
## 2016-10-27 DIAGNOSIS — H524 Presbyopia: Secondary | ICD-10-CM | POA: Diagnosis not present

## 2016-11-01 ENCOUNTER — Ambulatory Visit (HOSPITAL_COMMUNITY): Payer: Self-pay | Admitting: Psychiatry

## 2016-11-04 ENCOUNTER — Ambulatory Visit: Payer: Medicare HMO | Admitting: Family Medicine

## 2016-11-08 ENCOUNTER — Ambulatory Visit (INDEPENDENT_AMBULATORY_CARE_PROVIDER_SITE_OTHER): Payer: Medicare HMO | Admitting: Family Medicine

## 2016-11-08 ENCOUNTER — Encounter: Payer: Self-pay | Admitting: Family Medicine

## 2016-11-08 VITALS — BP 121/76 | HR 96 | Temp 98.2°F | Resp 16 | Ht 62.0 in | Wt 213.8 lb

## 2016-11-08 DIAGNOSIS — E782 Mixed hyperlipidemia: Secondary | ICD-10-CM | POA: Diagnosis not present

## 2016-11-08 DIAGNOSIS — E039 Hypothyroidism, unspecified: Secondary | ICD-10-CM | POA: Diagnosis not present

## 2016-11-08 DIAGNOSIS — R7303 Prediabetes: Secondary | ICD-10-CM

## 2016-11-08 DIAGNOSIS — Z5181 Encounter for therapeutic drug level monitoring: Secondary | ICD-10-CM

## 2016-11-08 MED ORDER — QUETIAPINE FUMARATE 50 MG PO TABS: 50.0000 mg | ORAL_TABLET | Freq: Every day | ORAL | 0 refills | Status: DC

## 2016-11-08 MED ORDER — DIAZEPAM 2 MG PO TABS: 4.0000 mg | ORAL_TABLET | ORAL | 0 refills | Status: DC

## 2016-11-08 MED ORDER — DULOXETINE HCL 60 MG PO CPEP: 60.0000 mg | ORAL_CAPSULE | Freq: Every day | ORAL | 0 refills | Status: DC

## 2016-11-08 NOTE — Patient Instructions (Addendum)
Start taking 2 of the 2 mg diazepam in the morning and continue the 5 mg diazepam at night.   I would like you to try 80 mg of lavender oil at night before bed. Let me know if you need more guidance in finding a specific products.  If the transcranial magnetic stimulation does not work, perhaps we should consider a trial of the MD are which is a method of IV movement that a therapist can guide you through the can help treat PTSD.  I also think that perhaps we should consider a consult with the [facility] if things worsen again.   Why don't you check it out at [email or web address]     IF you received an x-ray today, you will receive an invoice from [facility]. Please contact [facility] at [phone] with questions or concerns regarding your invoice.   IF you received labwork today, you will receive an invoice from [facility]. Please contact [facility] at [phone] with questions or concerns regarding your invoice.   Our billing staff will not be able to assist you with questions regarding bills from these companies.  You will be contacted with the lab results as soon as they are available. The fastest way to get your results is to activate your My Chart account. Instructions are located on the last page of this paperwork. If you have not heard from us regarding the results in 2 weeks, please contact this office.

## 2016-11-08 NOTE — Progress Notes (Signed)
Subjective:    Patient ID: Jennifer Davidson, female    DOB: 08/18/1955, 61 y.o.   MRN: [id number] Chief Complaint  Patient presents with  . Fatigue    depression scale during triage, score 5  . med change    HPI  The seizure-like activity is dramatically stopped.  She had one on Sat but she was worked up and tired which might have triggered it. She is sleeping better but at least 3-4 yrs.  Jennifer Davidson is going to be home for a few weeks. Jennifer Davidson and Jennifer Davidson are going to [place] this weekend.  They have not had to use the liquid diazepam.  She and her husband both think she is doing a little better.  Not sleeping as much during the day now. Previously she was napping more or lay in bed. Pain in her back varies for moderate to severe and treats with extra-strength tylenol prn. Did not have any increased pain with cymbalta increase Using CPAP everynight and using lidocaine pain patch during the day.  Somedays has some hangover from nighttime meds but not all the time.    [facility] 11/08/2016 10/14/2016 09/30/2016 09/09/2016 08/19/2016  Decreased Interest 1 0 2 0 0  Down, Depressed, Hopeless 1 0 2 0 0  PHQ - 2 Score 2 0 4 0 0  Altered sleeping 0 - 3 - -  Tired, decreased energy 1 - 3 - -  Change in appetite 1 - 2 - -  Feeling bad or failure about yourself  1 - 3 - -  Trouble concentrating 0 - 3 - -  Moving slowly or fidgety/restless 0 - 2 - -  Suicidal thoughts 0 - 2 - -  PHQ-9 Score 5 - 22 - -  Difficult doing work/chores Somewhat difficult - Somewhat difficult - -  Some recent data might be hidden     Review of Systems Bowls and urine nml    Objective:   Physical Exam  Constitutional: She is oriented to person, place, and time. She appears well-developed and well-nourished. No distress.  HENT:  Head: Normocephalic and atraumatic.  Right Ear: External ear normal.  Left Ear: External ear normal.  Eyes: Conjunctivae are normal. No scleral icterus.  Neck: Normal range of motion. Neck  supple. No thyromegaly present.  Cardiovascular: Normal rate, regular rhythm, normal heart sounds and intact distal pulses.   Pulmonary/Chest: Effort normal and breath sounds normal. No respiratory distress.  Musculoskeletal: She exhibits no edema.  Lymphadenopathy:    She has no cervical adenopathy.  Neurological: She is alert and oriented to person, place, and time.  Skin: Skin is warm and dry. She is not diaphoretic. No erythema.  Psychiatric: She has a normal mood and affect. Her behavior is normal.         BP 121/76   Pulse 96   Temp 98.2 F (36.8 C) (Oral)   Resp 16   Ht 5\' 2"  (1.575 m)   Wt 213 lb 12.8 oz (97 kg)   SpO2 97%   BMI 39.10 kg/m   Assessment & Plan:   1. Acquired hypothyroidism   2. Medication monitoring encounter   3. Prediabetes   4. Mixed hyperlipidemia     Orders Placed This Encounter  Procedures  . Thyroid Panel With TSH  . Comprehensive metabolic panel  . Lipid panel    Order Specific Question:   Has the patient fasted?    Answer:   Yes  . Care order/instruction:  AVS printed - let patient go!    Meds ordered this encounter  Medications  . Nutritional Supplements (EQ ESTROBLEND MENOPAUSE PO)    Sig: Take by mouth.  . Probiotic Product (PROBIOTIC ACIDOPHILUS BEADS PO)    Sig: Take by mouth.  . QUEtiapine (SEROQUEL) 50 MG tablet    Sig: Take 1 tablet (50 mg total) by mouth at bedtime.    Dispense:  90 tablet    Refill:  0  . diazepam (VALIUM) 2 MG tablet    Sig: Take 2 tablets (4 mg total) by mouth every morning.    Dispense:  180 tablet    Refill:  0  . DULoxetine (CYMBALTA) 60 MG capsule    Sig: Take 1 capsule (60 mg total) by mouth daily.    Dispense:  90 capsule    Refill:  0     Norberto Sorenson, M.D.  Primary Care at [facility] [address] [place], [place] [address] [phone] phone [phone] fax  11/10/16 9:50 PM

## 2016-11-09 LAB — COMPREHENSIVE METABOLIC PANEL
A/G RATIO: 1.8 (ref 1.2–2.2)
ALT: 22 IU/L (ref 0–32)
AST: 21 IU/L (ref 0–40)
Albumin: 4.6 g/dL (ref 3.6–4.8)
Alkaline Phosphatase: 83 IU/L (ref 39–117)
BILIRUBIN TOTAL: 0.3 mg/dL (ref 0.0–1.2)
BUN/Creatinine Ratio: 9 — ABNORMAL LOW (ref 12–28)
BUN: 14 mg/dL (ref 8–27)
CHLORIDE: 99 mmol/L (ref 96–106)
CO2: 24 mmol/L (ref 20–29)
Calcium: 10.2 mg/dL (ref 8.7–10.3)
Creatinine, Ser: 1.61 mg/dL — ABNORMAL HIGH (ref 0.57–1.00)
GFR calc non Af Amer: 34 mL/min/{1.73_m2} — ABNORMAL LOW (ref >59)
GFR, EST AFRICAN AMERICAN: 39 mL/min/{1.73_m2} — AB (ref >59)
GLOBULIN, TOTAL: 2.6 g/dL (ref 1.5–4.5)
Glucose: 98 mg/dL (ref 65–99)
POTASSIUM: 4.8 mmol/L (ref 3.5–5.2)
SODIUM: 138 mmol/L (ref 134–144)
Total Protein: 7.2 g/dL (ref 6.0–8.5)

## 2016-11-09 LAB — LIPID PANEL
Chol/HDL Ratio: 6.3 ratio — ABNORMAL HIGH (ref 0.0–4.4)
Cholesterol, Total: 321 mg/dL — ABNORMAL HIGH (ref 100–199)
HDL: 51 mg/dL (ref >39)
TRIGLYCERIDES: 533 mg/dL — AB (ref 0–149)

## 2016-11-09 LAB — THYROID PANEL WITH TSH
Free Thyroxine Index: 1.2 (ref 1.2–4.9)
T3 UPTAKE RATIO: 23 % — AB (ref 24–39)
T4 TOTAL: 5.4 ug/dL (ref 4.5–12.0)
TSH: 2.78 u[IU]/mL (ref 0.450–4.500)

## 2016-11-14 ENCOUNTER — Other Ambulatory Visit: Payer: Self-pay | Admitting: Family Medicine

## 2016-11-15 ENCOUNTER — Other Ambulatory Visit: Payer: Self-pay | Admitting: Family Medicine

## 2016-11-16 ENCOUNTER — Other Ambulatory Visit: Payer: Self-pay

## 2016-11-16 MED ORDER — DULOXETINE HCL 60 MG PO CPEP: 60.0000 mg | ORAL_CAPSULE | Freq: Every day | ORAL | 0 refills | Status: DC

## 2016-11-16 NOTE — Telephone Encounter (Signed)
You can see in the med list that this was already sent in on 6/25.

## 2016-11-16 NOTE — Telephone Encounter (Signed)
Rx refill request came in from pharmacy for Cymbalta 60 mg one by mouth daily #90. Pt last seen 11/08/16 and next office visit is 12/09/16.Pls adviseee and fill accordingly.

## 2016-11-16 NOTE — Addendum Note (Signed)
Addended by: Sherren MochaSHAW, Eberardo Demello N on: 11/16/2016 04:42 PM   Modules accepted: Orders

## 2016-11-29 ENCOUNTER — Encounter: Payer: Self-pay | Admitting: Neurology

## 2016-11-29 ENCOUNTER — Ambulatory Visit (INDEPENDENT_AMBULATORY_CARE_PROVIDER_SITE_OTHER): Payer: Medicare HMO | Admitting: Neurology

## 2016-11-29 VITALS — BP 110/76 | HR 108 | Ht 63.0 in | Wt 220.0 lb

## 2016-11-29 DIAGNOSIS — F331 Major depressive disorder, recurrent, moderate: Secondary | ICD-10-CM | POA: Diagnosis not present

## 2016-11-29 DIAGNOSIS — G43709 Chronic migraine without aura, not intractable, without status migrainosus: Secondary | ICD-10-CM

## 2016-11-29 DIAGNOSIS — F445 Conversion disorder with seizures or convulsions: Secondary | ICD-10-CM

## 2016-11-29 DIAGNOSIS — IMO0002 Reserved for concepts with insufficient information to code with codable children: Secondary | ICD-10-CM

## 2016-11-29 HISTORY — DX: Conversion disorder with seizures or convulsions: F44.5

## 2016-11-29 NOTE — Patient Instructions (Signed)
1. Continue all your medications 2. Continue working with Dr. Clelia CroftShaw 3. Follow-up in 6 months, call for any changes

## 2016-11-29 NOTE — Progress Notes (Signed)
NEUROLOGY FOLLOW UP OFFICE NOTE  Jennifer Davidson [id number] 01/04/1956  HISTORY OF PRESENT ILLNESS: I had the pleasure of seeing Jennifer Davidson in follow-up in the neurology clinic on 11/29/2016.  She is again accompanied by her husband who helps supplement the history today. The patient was last seen 2 months ago for episodes of left-sided jerking and difficulty talking, occurring when upset or anxious. Her routine EEG and 48-hour EEG were normal. She reported 3 seizures during the EEG, with no epileptiform correlate seen. She has started to see Psychiatry with a diagnosis of PTSD and depression, but had side effects on higher dose Cymbalta which caused her to be "like a zombie," hallucinating. She and her husband report that she is doing much better with streamlining of medications with her PCP. The episodes of staring and jerking have significantly decreased, she has not had any for more than 8 weeks. She can feel them coming on and would take prn Valium. Her husband feels she is doing better today than she has had in the past 6 months. She finally got a good night's rest because her back is not bothering her as much. She continues to have back pain but is going out more, rode her exercise bike a few times. She had been reporting headaches and was started on Propranolol, but when she became drowsy and hallucinated, Propranolol was stopped as well. She reports her headaches are better. Her husband reports she is more alert and not "talking out of her mind" anymore. She is scheduled for ketamine infusion for depression next month. She fell a couple of times, no significant injuries.   HPI 09/14/2016: This is a pleasant 61 yo RH woman with a history of hypertension, hypothyroidism, chronic back pain, chronic kidney disease, depression, anxiety, migraines,who presented for concern of new onset seizure-like episodes since February 2018. The first episode occurred in a restaurant when the left side of her face  suddenly started jerking, then gradually went down her arm and leg. Her husband describes her left leg would be extended. She had difficulty talking because she could not move her mouth. She was having these episodes 1-2 times a week, but over the past few weeks, they had increased, sometimes having 2 in one day. She had one on Friday, then 2 on Saturday. She would have some numbness on her left cheek and leg after the episodes, denies any focal weakness but she feels overall drained. Her husband reports that the episodes last 30-60 seconds, and with the event on Saturday, both arms were flexed at the elbow with her fists clenched, she bit the whole side of her mouth on the left. They report tongue bite in the past, no urinary incontinence. She has fallen and bruised her face with one of them. Her husband states prior to an episode, she would have a stare or appear fixed on something, "something is not right," he would call her or tap her and she would look at him. He has noticed that they occur when she gets more anxious or really upset, "she worries too much." He made a remark last Saturday about cutting a tree down and she got very mad, then had a bad episode. Because these seemed to be a trigger, she was given a prescription for Valium last month, which seems to help. Her husband tries to get her mouth open to put the medication under her tongue. It has also been helping her sleep better. She has a history of insomnia and  would only sleep for 1-2 hours, sometimes going 2-3 days without sleep. She would get delirious and talk to people who are not there. She had previously been taking Xanax, but they report this was stopped suddenly last week and switched to Ativan, which has not been helping her sleep.   She denies any olfactory/gustatory hallucinations, deja vu, rising epigastric sensation, myoclonic jerks. She has a history of migraines since age 58, Topamax dose increased to 50mg  in AM, 100mg  in PM 2  months ago. Migraines start in the occipital region and radiate to the vertex, with sharp stabbing pain, nausea, vomiting, photo and phonophobia. They occur around 4-5 times a week lasting all day. She takes Excedrin migraine or Tylenol ES 4-5 times a week, and if ineffective would take Maxalt. She has been taking Gabapentin 400mg  TID since 2012 for back pain. She has occasional dizziness. She denies any diplopia, dysarthria/dysphagia, bowel/bladder dysfunction. She reports her father had seizures that were "not epilepsy." Her paternal aunt had seizures ("same type he had"). Otherwise she had a normal birth and early development.  There is no history of febrile convulsions, CNS infections such as meningitis/encephalitis, significant traumatic brain injury, neurosurgical procedures.  I personally reviewed MRI brain without contrast done 08/09/16 which did not show any acute changes, reported as negative. On my review, it may be due to positioning, but there appears to be some asymmetry in the hippocampi with changes seen on the right side. She had an EEG on 09/01/16 which was normal.  PAST MEDICAL HISTORY: Past Medical History:  Diagnosis Date  . Anxiety   . Cholesterol serum increased   . Chronic back pain   . Chronic kidney disease   . Depression   . Diastolic heart failure (HCC)   . GERD (gastroesophageal reflux disease)   . Hyperparathyroidism (HCC)   . Hypertension   . Hypothyroidism   . Migraine   . Neuromuscular disorder (HCC)   . Sleep apnea   . Tachycardia     MEDICATIONS: Current Outpatient Prescriptions on File Prior to Visit  Medication Sig Dispense Refill  . ALPRAZolam (XANAX) 1 MG tablet Take 1 tablet (1 mg total) by mouth at bedtime as needed for anxiety. 90 tablet 0  . azelastine (ASTELIN) 0.1 % nasal spray Place 1 spray into both nostrils 2 (two) times daily. Use in each nostril as directed 30 mL 12  . Calcium-Magnesium-Vitamin D (CALCIUM 1200+D3 PO) Take 1 tablet by mouth  daily. Contains 800iu Vitamin D3    . cholecalciferol (VITAMIN D) 1000 UNITS tablet Take 1 tablet (1,000 Units total) by mouth daily at 12 noon. 30 tablet 0  . diazepam (DIAZEPAM INTENSOL) 5 MG/ML solution Take 0.5 mLs (2.5 mg total) by mouth every 12 (twelve) hours as needed (seizure-like activity). (Patient not taking: Reported on 11/08/2016) 30 mL 0  . diazepam (VALIUM) 2 MG tablet Take 2 tablets (4 mg total) by mouth every morning. 180 tablet 0  . diazepam (VALIUM) 5 MG tablet Take 1 tablet (5 mg total) by mouth every 12 (twelve) hours as needed for anxiety. 60 tablet 1  . Diclofenac Sodium 3 % GEL Place 1 application onto the skin 3 (three) times daily. Onto right knee 100 g 2  . dicyclomine (BENTYL) 20 MG tablet TAKE 1 TABLET FOUR TIMES A DAY BEFORE MEALS AND AT BEDTIME AS NEEDED FOR CRAMPING IRRITABLE BOWELS 180 tablet 0  . diphenoxylate-atropine (LOMOTIL) 2.5-0.025 MG tablet TAKE 1 TABLET BY MOUTH 4 TIMES A DAY AS NEEDED  FOR DIARRHEA 30 tablet 2  . DULoxetine (CYMBALTA) 60 MG capsule Take 1 capsule (60 mg total) by mouth daily. 90 capsule 0  . esomeprazole (NEXIUM) 40 MG capsule TAKE 1 CAPSULE (40 MG TOTAL) BY MOUTH DAILY. 90 capsule 3  . fluticasone (FLONASE) 50 MCG/ACT nasal spray Place 2 sprays into both nostrils at bedtime. 16 g 2  . furosemide (LASIX) 40 MG tablet TAKE 1 TABLET EVERY DAY 90 tablet 1  . gabapentin (NEURONTIN) 400 MG capsule Take 1 capsule (400 mg total) by mouth 3 (three) times daily. 270 capsule 0  . Guaifenesin (MUCINEX MAXIMUM STRENGTH) 1200 MG TB12 Take 1 tablet (1,200 mg total) by mouth every 12 (twelve) hours as needed. (Patient not taking: Reported on 10/14/2016) 14 tablet 1  . hydrOXYzine (ATARAX/VISTARIL) 50 MG tablet Take 2 tablets (100 mg total) by mouth at bedtime. May repeat in 6 hours if needed. 120 tablet 0  . ipratropium (ATROVENT) 0.03 % nasal spray Place 2 sprays into the nose 4 (four) times daily. 90 mL 0  . levothyroxine (SYNTHROID, LEVOTHROID) 50 MCG  tablet TAKE 1 TABLET EVERY DAY 90 tablet 1  . lidocaine (LIDODERM) 5 % PLACE 1 PATCH ONTO THE SKIN DAILY. REMOVE & DISCARD PATCH WITHIN 12 HOURS OR AS DIRECTED BY MD  11  . Nutritional Supplements (EQ ESTROBLEND MENOPAUSE PO) Take by mouth.    . ondansetron (ZOFRAN) 4 MG tablet TAKE 1 TABLET BY MOUTH EVERY 8 HOURS AS NEEDED FOR NAUSEA/VOMITING 20 tablet 11  . Potassium Chloride ER 20 MEQ TBCR TAKE 1 TABLET TWICE DAILY 180 tablet 0  . Probiotic Product (PROBIOTIC ACIDOPHILUS BEADS PO) Take by mouth.    . QUEtiapine (SEROQUEL) 50 MG tablet Take 1 tablet (50 mg total) by mouth at bedtime. 90 tablet 0  . QUEtiapine (SEROQUEL) 50 MG tablet TAKE 1 TABLET (50 MG TOTAL) BY MOUTH AT BEDTIME. 90 tablet 0  . rizatriptan (MAXALT) 10 MG tablet Take 0.5 tablets (5 mg total) by mouth as needed for migraine. May repeat in 2 hours if needed 10 tablet 5  . sucralfate (CARAFATE) 1 g tablet TAKE 1 TABLET FOUR TIMES DAILY WITH MEALS AND AT BEDTIME. 360 tablet 1  . topiramate (TOPAMAX) 100 MG tablet TAKE 1/2 TABLET  IN THE MORNING AND 1 TABLET AT BEDTIME 135 tablet 1   No current facility-administered medications on file prior to visit.     ALLERGIES: Allergies  Allergen Reactions  . Tramadol Nausea And Vomiting  . Trazodone And Nefazodone Other (See Comments)    Per pt trazodone caused hallucinations and behavior changes     FAMILY HISTORY: Family History  Problem Relation Age of Onset  . Diabetes Mother   . Heart disease Mother   . Kidney disease Mother   . Thyroid disease Mother   . Heart disease Father   . Seizures Father   . COPD Father   . Cancer Maternal Grandmother     SOCIAL HISTORY: Social History   Social History  . Marital status: Married    Spouse name: N/A  . Number of children: N/A  . Years of education: N/A   Occupational History  . Not on file.   Social History Main Topics  . Smoking status: Never Smoker  . Smokeless tobacco: Never Used  . Alcohol use No     Comment:  08-31-2016 PER PT NO  . Drug use: No     Comment: 08-31-2016 PER PT NO   . Sexual activity: Yes  Partners: Male     Comment: married   Other Topics Concern  . Not on file   Social History Narrative  . No narrative on file    REVIEW OF SYSTEMS: Constitutional: No fevers, chills, or sweats, no generalized fatigue, change in appetite Eyes: No visual changes, double vision, eye pain Ear, nose and throat: No hearing loss, ear pain, nasal congestion, sore throat Cardiovascular: No chest pain, palpitations Respiratory:  No shortness of breath at rest or with exertion, wheezes GastrointestinaI: No nausea, vomiting, diarrhea, abdominal pain, fecal incontinence Genitourinary:  No dysuria, urinary retention or frequency Musculoskeletal:  + neck pain, back pain Integumentary: No rash, pruritus, skin lesions Neurological: as above Psychiatric: + depression, insomnia, anxiety Endocrine: No palpitations, fatigue, diaphoresis, mood swings, change in appetite, change in weight, increased thirst Hematologic/Lymphatic:  No anemia, purpura, petechiae. Allergic/Immunologic: no itchy/runny eyes, nasal congestion, recent allergic reactions, rashes  PHYSICAL EXAM: Vitals:   11/29/16 1330  BP: 110/76  Pulse: (!) 108   General: No acute distress Head:  Normocephalic/atraumatic Neck: supple, no paraspinal tenderness, full range of motion Heart:  Regular rate and rhythm Lungs:  Clear to auscultation bilaterally Back: No paraspinal tenderness Skin/Extremities: No rash, no edema Neurological Exam: alert and oriented to person, place, and time. No aphasia or dysarthria. Fund of knowledge is appropriate.  Recent and remote memory are intact.  Attention and concentration are normal.    Able to name objects and repeat phrases. Cranial nerves: Pupils equal, round, reactive to light. Extraocular movements intact with no nystagmus. Visual fields full. Facial sensation intact. No facial asymmetry. Tongue,  uvula, palate midline.  Motor: Bulk and tone normal, muscle strength 5/5 throughout with no pronator drift.  Sensation to light touch intact.  No extinction to double simultaneous stimulation.  Deep tendon reflexes 2+ throughout, toes downgoing.  Finger to nose testing intact.  Gait slow and cautious with cane due to back pain.  Romberg negative.  IMPRESSION: This is a 61 yo RH woman with a history of  hypertension, hypothyroidism, chronic back pain, chronic kidney disease, depression, anxiety, migraines, who presented with new onset episodes of staring off followed by left facial jerking that marches down her left side. There were 3 "seizures" reported on 48-hour EEG, with no epileptiform correlate, indicating these were non-epileptic. Findings were discussed today, she and her husband continue to work on stressors, she is scheduled to see a different psychiatrist next month. Overall she is doing much better, no further spells in the past 8 weeks, she takes prn Valium when she feels them coming on. She is taking Topamax and Gabapentin for other indications (migraine and back pain), her husband is looking forward to potentially tapering down gabapentin as well. She feels headaches are better, we have agreed to hold off on starting new medication at this time and focus on treatment of sleep issues and depression, which can also worsen headaches. Continue follow-up with Psychiatry and psychotherapy. She will follow-up in 6 months and knows to call for any changes  Thank you for allowing me to participate in her care.  Please do not hesitate to call for any questions or concerns.  The duration of this appointment visit was 25 minutes of face-to-face time with the patient.  Greater than 50% of this time was spent in counseling, explanation of diagnosis, planning of further management, and coordination of care.   Patrcia Dolly, M.D.   CC: Dr. [name]

## 2016-12-02 ENCOUNTER — Other Ambulatory Visit: Payer: Self-pay | Admitting: Family Medicine

## 2016-12-09 ENCOUNTER — Encounter: Payer: Self-pay | Admitting: Family Medicine

## 2016-12-09 ENCOUNTER — Ambulatory Visit (INDEPENDENT_AMBULATORY_CARE_PROVIDER_SITE_OTHER): Payer: Medicare HMO | Admitting: Family Medicine

## 2016-12-09 VITALS — BP 118/73 | HR 93 | Temp 97.8°F | Resp 18 | Ht 63.0 in | Wt 218.6 lb

## 2016-12-09 DIAGNOSIS — K580 Irritable bowel syndrome with diarrhea: Secondary | ICD-10-CM | POA: Diagnosis not present

## 2016-12-09 MED ORDER — RIZATRIPTAN BENZOATE 10 MG PO TABS: 5.0000 mg | ORAL_TABLET | ORAL | 3 refills | Status: DC | PRN

## 2016-12-09 MED ORDER — DIAZEPAM 2 MG PO TABS: 4.0000 mg | ORAL_TABLET | Freq: Two times a day (BID) | ORAL | 0 refills | Status: DC | PRN

## 2016-12-09 MED ORDER — QUETIAPINE FUMARATE 50 MG PO TABS: 50.0000 mg | ORAL_TABLET | Freq: Every day | ORAL | 1 refills | Status: DC

## 2016-12-09 MED ORDER — DICLOFENAC SODIUM 3 % TD GEL: 1.0000 "application " | Freq: Three times a day (TID) | TRANSDERMAL | 2 refills | Status: DC

## 2016-12-09 MED ORDER — GABAPENTIN 400 MG PO CAPS: ORAL_CAPSULE | ORAL | 1 refills | Status: DC

## 2016-12-09 MED ORDER — FUROSEMIDE 40 MG PO TABS: 40.0000 mg | ORAL_TABLET | Freq: Every day | ORAL | 1 refills | Status: DC

## 2016-12-09 MED ORDER — POTASSIUM CHLORIDE ER 20 MEQ PO TBCR: 1.0000 | EXTENDED_RELEASE_TABLET | Freq: Every day | ORAL | 1 refills | Status: DC

## 2016-12-09 MED ORDER — HYDROXYZINE HCL 50 MG PO TABS: 100.0000 mg | ORAL_TABLET | Freq: Every day | ORAL | 1 refills | Status: DC

## 2016-12-09 MED ORDER — DULOXETINE HCL 60 MG PO CPEP: 60.0000 mg | ORAL_CAPSULE | Freq: Every day | ORAL | 1 refills | Status: DC

## 2016-12-09 MED ORDER — DIPHENOXYLATE-ATROPINE 2.5-0.025 MG PO TABS: 1.0000 | ORAL_TABLET | Freq: Four times a day (QID) | ORAL | 3 refills | Status: DC | PRN

## 2016-12-09 MED ORDER — LEVOTHYROXINE SODIUM 50 MCG PO TABS: 50.0000 ug | ORAL_TABLET | Freq: Every day | ORAL | 1 refills | Status: DC

## 2016-12-09 MED ORDER — ALPRAZOLAM 1 MG PO TABS: 1.0000 mg | ORAL_TABLET | Freq: Two times a day (BID) | ORAL | 0 refills | Status: DC | PRN

## 2016-12-09 MED ORDER — TOPIRAMATE 100 MG PO TABS: ORAL_TABLET | ORAL | 1 refills | Status: DC

## 2016-12-09 MED ORDER — ESOMEPRAZOLE MAGNESIUM 40 MG PO CPDR: DELAYED_RELEASE_CAPSULE | ORAL | 3 refills | Status: DC

## 2016-12-09 MED ORDER — ONDANSETRON HCL 4 MG PO TABS: ORAL_TABLET | ORAL | 3 refills | Status: DC

## 2016-12-09 NOTE — Patient Instructions (Addendum)
   IF you received an x-ray today, you will receive an invoice from [facility]. Please contact [facility] at [phone] with questions or concerns regarding your invoice.   IF you received labwork today, you will receive an invoice from [facility]. Please contact [facility] at [phone] with questions or concerns regarding your invoice.   Our billing staff will not be able to assist you with questions regarding bills from these companies.  You will be contacted with the lab results as soon as they are available. The fastest way to get your results is to activate your My Chart account. Instructions are located on the last page of this paperwork. If you have not heard from us regarding the results in 2 weeks, please contact this office.    Diet for Irritable Bowel Syndrome When you have irritable bowel syndrome (IBS), the foods you eat and your eating habits are very important. IBS may cause various symptoms, such as abdominal pain, constipation, or diarrhea. Choosing the right foods can help ease discomfort caused by these symptoms. Work with your health care provider and dietitian to find the best eating plan to help control your symptoms. What general guidelines do I need to follow?  Keep a food diary. This will help you identify foods that cause symptoms. Write down: ? What you eat and when. ? What symptoms you have. ? When symptoms occur in relation to your meals.  Avoid foods that cause symptoms. Talk with your dietitian about other ways to get the same nutrients that are in these foods.  Eat more foods that contain fiber. Take a fiber supplement if directed by your dietitian.  Eat your meals slowly, in a relaxed setting.  Aim to eat 5-6 small meals per day. Do not skip meals.  Drink enough fluids to keep your urine clear or pale yellow.  Ask your health care provider if you should take an over-the-counter probiotic during flare-ups to help restore healthy  gut bacteria.  If you have cramping or diarrhea, try making your meals low in fat and high in carbohydrates. Examples of carbohydrates are pasta, rice, whole grain breads and cereals, fruits, and vegetables.  If dairy products cause your symptoms to flare up, try eating less of them. You might be able to handle yogurt better than other dairy products because it contains bacteria that help with digestion. What foods are not recommended? The following are some foods and drinks that may worsen your symptoms:  Fatty foods, such as French fries.  Milk products, such as cheese or ice cream.  Chocolate.  Alcohol.  Products with caffeine, such as coffee.  Carbonated drinks, such as soda.  The items listed above may not be a complete list of foods and beverages to avoid. Contact your dietitian for more information. What foods are good sources of fiber? Your health care provider or dietitian may recommend that you eat more foods that contain fiber. Fiber can help reduce constipation and other IBS symptoms. Add foods with fiber to your diet a little at a time so that your body can get used to them. Too much fiber at once might cause gas and swelling of your abdomen. The following are some foods that are good sources of fiber:  Apples.  Peaches.  Pears.  Berries.  Figs.  Broccoli (raw).  Cabbage.  Carrots.  Raw peas.  Kidney beans.  Lima beans.  Whole grain bread.  Whole grain cereal.  Where to find more information: International Foundation for Functional   Gastrointestinal Disorders: www.iffgd.org National Institute of Diabetes and Digestive and Kidney Diseases: www.niddk.nih.gov/health-information/health-topics/digestive-diseases/ibs/Pages/facts.aspx This information is not intended to replace advice given to you by your health care provider. Make sure you discuss any questions you have with your health care provider. Document Released: 07/24/2003 Document Revised:  10/09/2015 Document Reviewed: 08/03/2013 Elsevier Interactive Patient Education  2018 [employer]  

## 2016-12-09 NOTE — Progress Notes (Signed)
Subjective:    Patient ID: Jennifer Davidson, female    DOB: 11/10/1955, 61 y.o.   MRN: [id number]  HPI  Back pain is not good - it hurts all the time, and is plannig to ake an appt to going to see Dr. Modesto CharonWong for a shot.   Sleep not good but better. Got is a new back brace and is walking a lot more around    The ketamine infusions are $500/shot and it is not covered by insurance and it is a series of 6 shots and she [place] have to [place] [facility].  SHe is going to get a consult and then decide if this is something worth pursuing.   She would really like to pare down her med list.  She was up for several days and Loraine LericheMark gave her the diazepam 5mg  and hydroxyzine 100mg  and it put her to sleep.   Lots of diarrhea but the medication is helping significantly - she has learned out to take it  Lab Results  Component Value Date   TSH 2.780 11/08/2016   TSH 1.980 08/05/2016   TSH 0.851 06/25/2016    Review of Systems     Objective:   Physical Exam       BP 118/73   Pulse 93   Temp 97.8 F (36.6 C) (Oral)   Resp 18   Ht 5\' 3"  (1.6 m)   Wt 218 lb 9.6 oz (99.2 kg)   SpO2 95%   BMI 38.72 kg/m   Assessment & Plan:   1. Irritable bowel syndrome with diarrhea     Meds ordered this encounter  Medications  . ALPRAZolam (XANAX) 1 MG tablet    Sig: Take 1 tablet (1 mg total) by mouth 2 (two) times daily as needed for anxiety.    Dispense:  180 tablet    Refill:  0    Please fax to [facility] F: [phone]  . DULoxetine (CYMBALTA) 60 MG capsule    Sig: Take 1 capsule (60 mg total) by mouth daily.    Dispense:  90 capsule    Refill:  1  . esomeprazole (NEXIUM) 40 MG capsule    Sig: TAKE 1 CAPSULE (40 MG TOTAL) BY MOUTH DAILY.    Dispense:  90 capsule    Refill:  3  . Potassium Chloride ER 20 MEQ TBCR    Sig: Take 1 tablet by mouth daily. With lasix    Dispense:  90 tablet    Refill:  1  . furosemide (LASIX) 40 MG tablet    Sig: Take 1 tablet (40 mg total)  by mouth daily.    Dispense:  90 tablet    Refill:  1  . gabapentin (NEURONTIN) 400 MG capsule    Sig: TAKE 1 CAPSULE THREE TIMES DAILY    Dispense:  270 capsule    Refill:  1  . hydrOXYzine (ATARAX/VISTARIL) 50 MG tablet    Sig: Take 2 tablets (100 mg total) by mouth at bedtime. May repeat in 6 hours if needed.    Dispense:  270 tablet    Refill:  1  . levothyroxine (SYNTHROID, LEVOTHROID) 50 MCG tablet    Sig: Take 1 tablet (50 mcg total) by mouth daily.    Dispense:  90 tablet    Refill:  1  . topiramate (TOPAMAX) 100 MG tablet    Sig: TAKE 1/2 TABLET  IN THE MORNING AND 1 TABLET AT BEDTIME  Dispense:  135 tablet    Refill:  1  . QUEtiapine (SEROQUEL) 50 MG tablet    Sig: Take 1-2 tablets (50-100 mg total) by mouth at bedtime. As directed by physician    Dispense:  180 tablet    Refill:  1  . rizatriptan (MAXALT) 10 MG tablet    Sig: Take 0.5 tablets (5 mg total) by mouth as needed for migraine. May repeat in 2 hours if needed    Dispense:  30 tablet    Refill:  3  . ondansetron (ZOFRAN) 4 MG tablet    Sig: TAKE 1 TABLET BY MOUTH EVERY 8 HOURS AS NEEDED FOR NAUSEA/VOMITING    Dispense:  60 tablet    Refill:  3  . diphenoxylate-atropine (LOMOTIL) 2.5-0.025 MG tablet    Sig: Take 1 tablet by mouth 4 (four) times daily as needed for diarrhea or loose stools.    Dispense:  120 tablet    Refill:  3  . Diclofenac Sodium 3 % GEL    Sig: Place 1 application onto the skin 3 (three) times daily. Onto right knee    Dispense:  300 g    Refill:  2  . diazepam (VALIUM) 2 MG tablet    Sig: Take 2 tablets (4 mg total) by mouth every 12 (twelve) hours as needed for anxiety.    Dispense:  360 tablet    Refill:  0     Norberto SorensonEva Burnice Vassel, M.D.  Primary Care at [facility]  [place] [address] [place], [place] [address] [phone] phone [phone] fax  12/12/16 12:02 AM

## 2016-12-15 NOTE — Progress Notes (Addendum)
Subjective:    Patient ID: Jennifer Davidson, female    DOB: 10-04-1955, 61 y.o.   MRN: [id number] Chief Complaint  Patient presents with  . rash    states her entire body is breaking out; itches very bad; has tried benadryl with no relief    HPI  Jennifer Davidson is a delightful 61 yo woman who is here today due to a rash.   I last saw her 5d prior for her chronic medical conditions. She was doing well at that time - rash-free - and we refilled her medications but did not start any new ones.   HLD: can't calculate LDL due to triglycerides of 533. Non-HDL 270.  Used to be on fenofibrate 145 and crestor 20 in 2014  Past Medical History:  Diagnosis Date  . Anxiety   . Cholesterol serum increased   . Chronic back pain   . Chronic kidney disease   . Depression   . Diastolic heart failure ([place])   . GERD (gastroesophageal reflux disease)   . Hyperparathyroidism ([place])   . Hypertension   . Hypothyroidism   . Migraine   . Neuromuscular disorder ([place])   . Sleep apnea   . Tachycardia    Past Surgical History:  Procedure Laterality Date  . ABDOMINAL HYSTERECTOMY    . APPENDECTOMY    . BACK SURGERY    . KNEE SURGERY     Current Outpatient Prescriptions on File Prior to Visit  Medication Sig Dispense Refill  . ALPRAZolam (XANAX) 1 MG tablet Take 1 tablet (1 mg total) by mouth 2 (two) times daily as needed for anxiety. 180 tablet 0  . azelastine (ASTELIN) 0.1 % nasal spray Place 1 spray into both nostrils 2 (two) times daily. Use in each nostril as directed 30 mL 12  . Calcium-Magnesium-Vitamin D (CALCIUM 1200+D3 PO) Take 1 tablet by mouth daily. Contains 800iu Vitamin D3    . cholecalciferol (VITAMIN D) 1000 UNITS tablet Take 1 tablet (1,000 Units total) by mouth daily at 12 noon. 30 tablet 0  . diazepam (DIAZEPAM INTENSOL) 5 MG/ML solution Take 0.5 mLs (2.5 mg total) by mouth every 12 (twelve) hours as needed (seizure-like activity). 30 mL 0  . diazepam (VALIUM) 2 MG tablet Take 2 tablets (4 mg  total) by mouth every 12 (twelve) hours as needed for anxiety. 360 tablet 0  . Diclofenac Sodium 3 % GEL Place 1 application onto the skin 3 (three) times daily. Onto right knee 300 g 2  . dicyclomine (BENTYL) 20 MG tablet TAKE 1 TABLET FOUR TIMES A DAY BEFORE MEALS AND AT BEDTIME AS NEEDED FOR CRAMPING IRRITABLE BOWELS 180 tablet 0  . diphenoxylate-atropine (LOMOTIL) 2.5-0.025 MG tablet Take 1 tablet by mouth 4 (four) times daily as needed for diarrhea or loose stools. 120 tablet 3  . DULoxetine (CYMBALTA) 60 MG capsule Take 1 capsule (60 mg total) by mouth daily. 90 capsule 1  . esomeprazole (NEXIUM) 40 MG capsule TAKE 1 CAPSULE (40 MG TOTAL) BY MOUTH DAILY. 90 capsule 3  . fluticasone (FLONASE) 50 MCG/ACT nasal spray Place 2 sprays into both nostrils at bedtime. 16 g 2  . furosemide (LASIX) 40 MG tablet Take 1 tablet (40 mg total) by mouth daily. 90 tablet 1  . gabapentin (NEURONTIN) 400 MG capsule TAKE 1 CAPSULE THREE TIMES DAILY 270 capsule 1  . Guaifenesin (MUCINEX MAXIMUM STRENGTH) 1200 MG TB12 Take 1 tablet (1,200 mg total) by mouth every 12 (twelve) hours as needed. 14 tablet 1  .  hydrOXYzine (ATARAX/VISTARIL) 50 MG tablet Take 2 tablets (100 mg total) by mouth at bedtime. May repeat in 6 hours if needed. 270 tablet 1  . levothyroxine (SYNTHROID, LEVOTHROID) 50 MCG tablet Take 1 tablet (50 mcg total) by mouth daily. 90 tablet 1  . lidocaine (LIDODERM) 5 % PLACE 1 PATCH ONTO THE SKIN DAILY. REMOVE & DISCARD PATCH WITHIN 12 HOURS OR AS DIRECTED BY MD  11  . Nutritional Supplements (EQ ESTROBLEND MENOPAUSE PO) Take by mouth.    . ondansetron (ZOFRAN) 4 MG tablet TAKE 1 TABLET BY MOUTH EVERY 8 HOURS AS NEEDED FOR NAUSEA/VOMITING 60 tablet 3  . Potassium Chloride ER 20 MEQ TBCR Take 1 tablet by mouth daily. With lasix 90 tablet 1  . Probiotic Product (PROBIOTIC ACIDOPHILUS BEADS PO) Take by mouth.    . QUEtiapine (SEROQUEL) 50 MG tablet Take 1-2 tablets (50-100 mg total) by mouth at bedtime. As  directed by physician 180 tablet 1  . rizatriptan (MAXALT) 10 MG tablet Take 0.5 tablets (5 mg total) by mouth as needed for migraine. May repeat in 2 hours if needed 30 tablet 3  . sucralfate (CARAFATE) 1 g tablet TAKE 1 TABLET FOUR TIMES DAILY WITH MEALS AND AT BEDTIME. 360 tablet 1  . topiramate (TOPAMAX) 100 MG tablet TAKE 1/2 TABLET  IN THE MORNING AND 1 TABLET AT BEDTIME 135 tablet 1   No current facility-administered medications on file prior to visit.    Allergies  Allergen Reactions  . Tramadol Nausea And Vomiting  . Trazodone And Nefazodone Other (See Comments)    Per pt trazodone caused hallucinations and behavior changes    Family History  Problem Relation Age of Onset  . Diabetes Mother   . Heart disease Mother   . Kidney disease Mother   . Thyroid disease Mother   . Heart disease Father   . Seizures Father   . COPD Father   . Cancer Maternal Grandmother    Social History   Social History  . Marital status: Married    Spouse name: N/A  . Number of children: N/A  . Years of education: N/A   Social History Main Topics  . Smoking status: Never Smoker  . Smokeless tobacco: Never Used  . Alcohol use No     Comment: 08-31-2016 PER PT NO  . Drug use: No     Comment: 08-31-2016 PER PT NO   . Sexual activity: Yes    Partners: Male     Comment: married   Other Topics Concern  . None   Social History Narrative  . None   Depression screen [facility] 2/9 12/16/2016 12/09/2016 11/08/2016 10/14/2016 09/30/2016  Decreased Interest 0 0 1 0 2  Down, Depressed, Hopeless 0 0 1 0 2  PHQ - 2 Score 0 0 2 0 4  Altered sleeping - - 0 - 3  Tired, decreased energy - - 1 - 3  Change in appetite - - 1 - 2  Feeling bad or failure about yourself  - - 1 - 3  Trouble concentrating - - 0 - 3  Moving slowly or fidgety/restless - - 0 - 2  Suicidal thoughts - - 0 - 2  PHQ-9 Score - - 5 - 22  Difficult doing work/chores - - Somewhat difficult - Somewhat difficult  Some recent data might be  hidden     Review of Systems See hpi    Objective:   Physical Exam  Constitutional: She is oriented to person, place,  and time. She appears well-developed and well-nourished. No distress.  HENT:  Head: Normocephalic and atraumatic.  Right Ear: External ear normal.  Eyes: Conjunctivae are normal. No scleral icterus.  Pulmonary/Chest: Effort normal.  Neurological: She is alert and oriented to person, place, and time.  Skin: Skin is warm and dry. Rash noted. She is not diaphoretic. No erythema.  Psychiatric: She has a normal mood and affect. Her behavior is normal.    BP (!) 141/91   Pulse (!) 120   Temp 97.8 F (36.6 C) (Oral)   Resp 18   Ht 5' 3"  (1.6 m)   Wt 217 lb 6.4 oz (98.6 kg)   SpO2 93%   BMI 38.51 kg/m      Assessment & Plan:    1. Elevated blood pressure reading   2. Irritant contact dermatitis, unspecified trigger   3. Chronic bilateral low back pain with right-sided sciatica   4. Chronic pain of right lower extremity - had rx'ed topical diclofenac gel prior but sent in wrong strength 3%, looks like the 1% is not covered by insurance but can get the patches with a PA - pt has failed topical lidocaine pathes and numerous other meds inc gabapentin (still on), cymbalta (still on), APAP, amitriptyline, buprenorphine, cyclobenzaprine, hydrocodone, methocarbamol, morphine, oxycodone, and injections. Can't use systemic nsaids due to CKDII from chronic nsaid od to treat pain but eGFR 40-50 so ok to use topical.  5. Mixed hyperlipidemia - rec restart statin - atorvastatin? - discuss at f/u    Orders Placed This Encounter  Procedures  . Care order/instruction:    Scheduling Instructions:     Recheck BP and pulse    Meds ordered this encounter  Medications  . methylPREDNISolone acetate (DEPO-MEDROL) injection 120 mg  . triamcinolone cream (KENALOG) 0.1 %    Sig: Apply 1 application topically 4 (four) times daily. For itching rash    Dispense:  453.6 g    Refill:   0  . predniSONE (DELTASONE) 20 MG tablet    Sig: Take 1 tablet (20 mg total) by mouth daily with breakfast.    Dispense:  5 tablet    Refill:  0     Delman Cheadle, M.D.  Primary Care at [facility] [address] [place], [place] [address] [phone] phone [phone] fax  12/17/16 12:07 AM

## 2016-12-16 ENCOUNTER — Encounter: Payer: Self-pay | Admitting: Family Medicine

## 2016-12-16 ENCOUNTER — Ambulatory Visit (INDEPENDENT_AMBULATORY_CARE_PROVIDER_SITE_OTHER): Payer: Medicare HMO | Admitting: Family Medicine

## 2016-12-16 VITALS — BP 125/83 | HR 121 | Temp 97.8°F | Resp 18 | Ht 63.0 in | Wt 217.4 lb

## 2016-12-16 DIAGNOSIS — E782 Mixed hyperlipidemia: Secondary | ICD-10-CM | POA: Diagnosis not present

## 2016-12-16 DIAGNOSIS — M79604 Pain in right leg: Secondary | ICD-10-CM

## 2016-12-16 DIAGNOSIS — G8929 Other chronic pain: Secondary | ICD-10-CM | POA: Diagnosis not present

## 2016-12-16 DIAGNOSIS — M5441 Lumbago with sciatica, right side: Secondary | ICD-10-CM | POA: Diagnosis not present

## 2016-12-16 DIAGNOSIS — R030 Elevated blood-pressure reading, without diagnosis of hypertension: Secondary | ICD-10-CM

## 2016-12-16 DIAGNOSIS — L249 Irritant contact dermatitis, unspecified cause: Secondary | ICD-10-CM

## 2016-12-16 MED ORDER — TRIAMCINOLONE ACETONIDE 0.1 % EX CREA: 1.0000 "application " | TOPICAL_CREAM | Freq: Four times a day (QID) | CUTANEOUS | 0 refills | Status: DC

## 2016-12-16 MED ORDER — METHYLPREDNISOLONE ACETATE 80 MG/ML IJ SUSP
120.0000 mg | Freq: Once | INTRAMUSCULAR | Status: AC
  Administered 2016-12-16: 120 mg via INTRAMUSCULAR

## 2016-12-16 MED ORDER — PREDNISONE 20 MG PO TABS: 20.0000 mg | ORAL_TABLET | Freq: Every day | ORAL | 0 refills | Status: DC

## 2016-12-16 NOTE — Patient Instructions (Addendum)
you received a large dose of a steroid shot today which should last for 3-4 days. I sent a prescription for a cream called triamcinolone to your pharmacy. Keep this in the fridge and apply it 4 times a day to the areas of the rash to treat. Stop when the rash resolves. Overtreatment could lead to thinning or lightening of the skin. If in 3-4 days you notice the rash and itching worsening again, you may been fill the prescription for prednisone 20 mg a day for 5 days. Remember to take this first thing in the morning with food and you may stop as soon as the rash resolves. If you are doing well and things are continuing to improve with the cream alone, please do not fill this. Continue all of your other current medications, especially including the hydroxyzine, which will also help with this.    IF you received an x-ray today, you will receive an invoice from [facility]. Please contact [facility] at [phone] with questions or concerns regarding your invoice.   IF you received labwork today, you will receive an invoice from [facility]. Please contact [facility] at [phone] with questions or concerns regarding your invoice.   Our billing staff will not be able to assist you with questions regarding bills from these companies.  You will be contacted with the lab results as soon as they are available. The fastest way to get your results is to activate your My Chart account. Instructions are located on the last page of this paperwork. If you have not heard from us regarding the results in 2 weeks, please contact this office.      Contact Dermatitis Dermatitis is redness, soreness, and swelling (inflammation) of the skin. Contact dermatitis is a reaction to certain substances that touch the skin. There are two types of contact dermatitis:  Irritant contact dermatitis. This type is caused by something that irritates your skin, such as dry hands from washing them too much. This  type does not require previous exposure to the substance for a reaction to occur. This type is more common.  Allergic contact dermatitis. This type is caused by a substance that you are allergic to, such as a nickel allergy or poison ivy. This type only occurs if you have been exposed to the substance (allergen) before. Upon a repeat exposure, your body reacts to the substance. This type is less common.  What are the causes? Many different substances can cause contact dermatitis. Irritant contact dermatitis is most commonly caused by exposure to:  Makeup.  Soaps.  Detergents.  Bleaches.  Acids.  Metal salts, such as nickel.  Allergic contact dermatitis is most commonly caused by exposure to:  Poisonous plants.  Chemicals.  Jewelry.  Latex.  Medicines.  Preservatives in products, such as clothing.  What increases the risk? This condition is more likely to develop in:  People who have jobs that expose them to irritants or allergens.  People who have certain medical conditions, such as asthma or eczema.  What are the signs or symptoms? Symptoms of this condition may occur anywhere on your body where the irritant has touched you or is touched by you. Symptoms include:  Dryness or flaking.  Redness.  Cracks.  Itching.  Pain or a burning feeling.  Blisters.  Drainage of small amounts of blood or clear fluid from skin cracks.  With allergic contact dermatitis, there may also be swelling in areas such as the eyelids, mouth, or genitals. How is this diagnosed?  This condition is diagnosed with a medical history and physical exam. A patch skin test may be performed to help determine the cause. If the condition is related to your job, you may need to see an occupational medicine specialist. How is this treated? Treatment for this condition includes figuring out what caused the reaction and protecting your skin from further contact. Treatment may also  include:  Steroid creams or ointments. Oral steroid medicines may be needed in more severe cases.  Antibiotics or antibacterial ointments, if a skin infection is present.  Antihistamine lotion or an antihistamine taken by mouth to ease itching.  A bandage (dressing).  Follow these instructions at home: Skin Care  Moisturize your skin as needed.  Apply cool compresses to the affected areas.  Try taking a bath with: ? Epsom salts. Follow the instructions on the packaging. You can get these at your local pharmacy or grocery store. ? Baking soda. Pour a small amount into the bath as directed by your health care provider. ? Colloidal oatmeal. Follow the instructions on the packaging. You can get this at your local pharmacy or grocery store.  Try applying baking soda paste to your skin. Stir water into baking soda until it reaches a paste-like consistency.  Do not scratch your skin.  Bathe less frequently, such as every other day.  Bathe in lukewarm water. Avoid using hot water. Medicines  Take or apply over-the-counter and prescription medicines only as told by your health care provider.  If you were prescribed an antibiotic medicine, take or apply your antibiotic as told by your health care provider. Do not stop using the antibiotic even if your condition starts to improve. General instructions  Keep all follow-up visits as told by your health care provider. This is important.  Avoid the substance that caused your reaction. If you do not know what caused it, keep a journal to try to track what caused it. Write down: ? What you eat. ? What cosmetic products you use. ? What you drink. ? What you wear in the affected area. This includes jewelry.  If you were given a dressing, take care of it as told by your health care provider. This includes when to change and remove it. Contact a health care provider if:  Your condition does not improve with treatment.  Your condition gets  worse.  You have signs of infection such as swelling, tenderness, redness, soreness, or warmth in the affected area.  You have a fever.  You have new symptoms. Get help right away if:  You have a severe headache, neck pain, or neck stiffness.  You vomit.  You feel very sleepy.  You notice red streaks coming from the affected area.  Your bone or joint underneath the affected area becomes painful after the skin has healed.  The affected area turns darker.  You have difficulty breathing. This information is not intended to replace advice given to you by your health care provider. Make sure you discuss any questions you have with your health care provider. Document Released: 04/30/2000 Document Revised: 10/09/2015 Document Reviewed: 09/18/2014 Elsevier Interactive Patient Education  2018 [employer]

## 2016-12-17 ENCOUNTER — Encounter: Payer: Self-pay | Admitting: Family Medicine

## 2016-12-17 MED ORDER — DICLOFENAC EPOLAMINE 1.3 % TD PTCH: 1.0000 | MEDICATED_PATCH | Freq: Two times a day (BID) | TRANSDERMAL | 0 refills | Status: DC

## 2016-12-17 NOTE — Addendum Note (Signed)
Addended by: Sherren MochaSHAW, EVA N on: 12/17/2016 03:27 PM   Modules accepted: Orders, Level of Service

## 2016-12-23 DIAGNOSIS — E785 Hyperlipidemia, unspecified: Secondary | ICD-10-CM | POA: Diagnosis not present

## 2016-12-23 DIAGNOSIS — G709 Myoneural disorder, unspecified: Secondary | ICD-10-CM | POA: Diagnosis not present

## 2016-12-23 DIAGNOSIS — E039 Hypothyroidism, unspecified: Secondary | ICD-10-CM | POA: Diagnosis not present

## 2016-12-23 DIAGNOSIS — D631 Anemia in chronic kidney disease: Secondary | ICD-10-CM | POA: Diagnosis not present

## 2016-12-23 DIAGNOSIS — N183 Chronic kidney disease, stage 3 (moderate): Secondary | ICD-10-CM | POA: Diagnosis not present

## 2016-12-23 DIAGNOSIS — N189 Chronic kidney disease, unspecified: Secondary | ICD-10-CM | POA: Diagnosis not present

## 2016-12-23 DIAGNOSIS — I10 Essential (primary) hypertension: Secondary | ICD-10-CM | POA: Diagnosis not present

## 2016-12-23 DIAGNOSIS — G4733 Obstructive sleep apnea (adult) (pediatric): Secondary | ICD-10-CM | POA: Diagnosis not present

## 2016-12-23 DIAGNOSIS — Z9989 Dependence on other enabling machines and devices: Secondary | ICD-10-CM | POA: Diagnosis not present

## 2016-12-27 ENCOUNTER — Telehealth: Payer: Self-pay

## 2016-12-27 NOTE — Telephone Encounter (Signed)
Submitted claim through cover my meds.  Key code is [id number].  Claim pending a result in 2 business days.

## 2016-12-29 NOTE — Telephone Encounter (Signed)
Received denial fax on Flector patches.  Started Electronics [employer]Appeal with [employer].  (Appeal Dept # is [phone]) (Appeal Dept Fax # is [phone]) (case # is [id number]) Representative filed request for appeal as urgent and stated that we should receive approval or denial within 72 hrs.

## 2016-12-29 NOTE — Telephone Encounter (Signed)
Patient called back.  I advised her of previous message and told her that I would call her back as soon as I get an approval or denial on appeal.  She was happy with this response.

## 2017-01-03 ENCOUNTER — Ambulatory Visit: Payer: Self-pay | Admitting: Neurology

## 2017-01-03 ENCOUNTER — Telehealth: Payer: Self-pay

## 2017-01-03 NOTE — Telephone Encounter (Signed)
Called today to find out status of appeal.  It is still being processed.

## 2017-01-03 NOTE — Telephone Encounter (Signed)
Received fax from [facility] that after the appeal they are still denying coverage of the Flector. Would you like to prescribe something else ?

## 2017-01-04 NOTE — Telephone Encounter (Signed)
I called patient's [employer] appeals department, and held the line for over 20 minutes to speak to a representative.  When one finally came to the line, we were only able to exchange names when the call was disconnected.  I did not call back at this time.

## 2017-01-05 ENCOUNTER — Telehealth (HOSPITAL_COMMUNITY): Payer: Self-pay | Admitting: *Deleted

## 2017-01-05 ENCOUNTER — Telehealth: Payer: Self-pay

## 2017-01-05 NOTE — Telephone Encounter (Signed)
Pt [facility] [place] requesting refills for pt Duloxetine HCL DR 60 mg BID. Per pt chart, pt is taking Duloxetine 60 mg QD. Pt medication was last filled on 12-09-2016 with 90 caps 1 refill. Pt pharmacy number is [phone]. Also per pt chart, medication was filled by Dr. Norberto Sorenson.

## 2017-01-05 NOTE — Telephone Encounter (Signed)
While reviewing chart I saw a denial for coverage of Flector patches on 01/03/2017 received by Jennifer Davidson.  She sent an inquiry to Dr. [name] regarding next step.  I will contact the patient to let her know we are still in a waiting pattern. She was appreciative that she received a call.

## 2017-01-05 NOTE — Telephone Encounter (Signed)
Will not prescribe medication at this time given no follow up, and also given she appears to have a refill.

## 2017-01-05 NOTE — Telephone Encounter (Signed)
noted 

## 2017-01-06 ENCOUNTER — Telehealth (HOSPITAL_COMMUNITY): Payer: Self-pay | Admitting: *Deleted

## 2017-01-06 NOTE — Telephone Encounter (Signed)
Fax received from pt [facility] [place] for refills on pt Cymbalta. Per previous notes on file provider wants pt to sch f/u appt. Called pt to sch f/u appt and was unable to reach her. Staff lmtcb and office number was provided on voicemail.

## 2017-01-14 ENCOUNTER — Telehealth (HOSPITAL_COMMUNITY): Payer: Self-pay | Admitting: *Deleted

## 2017-01-14 NOTE — Telephone Encounter (Signed)
Office received fax from [facility] [place] for pt Duloxetine HCL DR 60 mg BID. Per pt chart pt is taking this medication QD and it was sent on 12-09-2016 with 90 tabs 1 [facility]ill [facility]. Pt was last seen on 10-04-2016 and was to f/u on 11-01-2016 but she no showed. Pt do not have f/u appt. Pt pharmacy number that's requesting medication is [phone].

## 2017-01-14 NOTE — Telephone Encounter (Signed)
Here is from my last note: "had rx'ed topical diclofenac gel prior but sent in wrong strength 3%, looks like the 1% is not covered by insurance but can get the patches with a PA - pt has failed topical lidocaine patches and numerous other meds inc gabapentin (still on), cymbalta (still on), APAP, amitriptyline, buprenorphine, cyclobenzaprine, hydrocodone, methocarbamol, morphine, oxycodone, and injections. Can't use systemic nsaids due to CKDII from chronic nsaid od to treat pain but eGFR 40-50 so ok to use topical."    I'm not sure what else they want [place] to use?  What else is there?  Do they have any topical anti-inflammatories that they will pay for?  If not, pt could pay out of pocket for a compound cream that has several different things in them which can work well but be expensive - check out topical pain solutions (like [email or web address].[email or web address]).

## 2017-01-18 ENCOUNTER — Telehealth (HOSPITAL_COMMUNITY): Payer: Self-pay | Admitting: *Deleted

## 2017-01-18 NOTE — Telephone Encounter (Signed)
noted 

## 2017-01-18 NOTE — Telephone Encounter (Signed)
If she only takes one a day, she should have enough

## 2017-01-18 NOTE — Telephone Encounter (Signed)
Per previous note on fill, pt [facility] [place] [facility]faxed office for refill for pt Duloxetine. Per pt chart, pt medication was filled on 12-09-2016 with 90 tabs 1 [facility] for script that is only QD. Also per pt chart, this medication was sent by Dr. Norberto SorensonShaw, Eva. Per pt last f/u with Dr. Vanetta ShawlHisada on 10-04-2016, provider instructed pt to take medication BID. Per previous note from on 01-18-2017, if pt is taking her medication QD, pt should have enough medication. Staff called pt to get clarifications as to which pharmacy is she using and to get the SIG for her Cymbalta. Staff was unable to reach pt and lmtcb and office number was provided. Message will be sent to provider(s). Pharmacy number is [phone].

## 2017-01-19 NOTE — Telephone Encounter (Signed)
Noted that no more refill in the future unless she has follow up appointment.

## 2017-01-19 NOTE — Telephone Encounter (Signed)
noted 

## 2017-01-20 NOTE — Telephone Encounter (Signed)
noted 

## 2017-01-26 NOTE — Telephone Encounter (Signed)
LMVM to CB.  Left patient my direct line.

## 2017-02-07 ENCOUNTER — Other Ambulatory Visit: Payer: Self-pay | Admitting: Physician Assistant

## 2017-02-14 ENCOUNTER — Ambulatory Visit: Payer: Medicare HMO | Admitting: Family Medicine

## 2017-02-19 ENCOUNTER — Ambulatory Visit (INDEPENDENT_AMBULATORY_CARE_PROVIDER_SITE_OTHER): Payer: Medicare HMO | Admitting: Family Medicine

## 2017-02-19 ENCOUNTER — Encounter: Payer: Self-pay | Admitting: Family Medicine

## 2017-02-19 VITALS — BP 118/74 | HR 74 | Temp 97.8°F | Resp 16 | Ht 63.0 in | Wt 208.0 lb

## 2017-02-19 DIAGNOSIS — R8271 Bacteriuria: Secondary | ICD-10-CM | POA: Diagnosis not present

## 2017-02-19 DIAGNOSIS — R197 Diarrhea, unspecified: Secondary | ICD-10-CM | POA: Diagnosis not present

## 2017-02-19 DIAGNOSIS — F5105 Insomnia due to other mental disorder: Secondary | ICD-10-CM

## 2017-02-19 DIAGNOSIS — W19XXXA Unspecified fall, initial encounter: Secondary | ICD-10-CM | POA: Diagnosis not present

## 2017-02-19 DIAGNOSIS — Y92009 Unspecified place in unspecified non-institutional (private) residence as the place of occurrence of the external cause: Secondary | ICD-10-CM | POA: Diagnosis not present

## 2017-02-19 DIAGNOSIS — R7303 Prediabetes: Secondary | ICD-10-CM | POA: Diagnosis not present

## 2017-02-19 DIAGNOSIS — K580 Irritable bowel syndrome with diarrhea: Secondary | ICD-10-CM | POA: Diagnosis not present

## 2017-02-19 DIAGNOSIS — R42 Dizziness and giddiness: Secondary | ICD-10-CM | POA: Diagnosis not present

## 2017-02-19 DIAGNOSIS — R739 Hyperglycemia, unspecified: Secondary | ICD-10-CM

## 2017-02-19 DIAGNOSIS — F99 Mental disorder, not otherwise specified: Secondary | ICD-10-CM

## 2017-02-19 DIAGNOSIS — J302 Other seasonal allergic rhinitis: Secondary | ICD-10-CM

## 2017-02-19 DIAGNOSIS — E039 Hypothyroidism, unspecified: Secondary | ICD-10-CM

## 2017-02-19 LAB — POCT URINALYSIS DIP (MANUAL ENTRY)
BILIRUBIN UA: NEGATIVE
Blood, UA: NEGATIVE
GLUCOSE UA: NEGATIVE mg/dL
Ketones, POC UA: NEGATIVE mg/dL
LEUKOCYTES UA: NEGATIVE
NITRITE UA: NEGATIVE
PH UA: 7 (ref 5.0–8.0)
Protein Ur, POC: NEGATIVE mg/dL
Spec Grav, UA: 1.015 (ref 1.010–1.025)
Urobilinogen, UA: 1 E.U./dL

## 2017-02-19 LAB — POCT CBC
Granulocyte percent: 55.6 %G (ref 37–80)
HCT, POC: 38.7 % (ref 37.7–47.9)
HEMOGLOBIN: 12.5 g/dL (ref 12.2–16.2)
LYMPH, POC: 2.8 (ref 0.6–3.4)
MCH, POC: 25.9 pg — AB (ref 27–31.2)
MCHC: 32.3 g/dL (ref 31.8–35.4)
MCV: 80.1 fL (ref 80–97)
MID (cbc): 0.4 (ref 0–0.9)
MPV: 6.4 fL (ref 0–99.8)
POC Granulocyte: 4 (ref 2–6.9)
POC LYMPH PERCENT: 39.1 %L (ref 10–50)
POC MID %: 5.3 % (ref 0–12)
Platelet Count, POC: 286 10*3/uL (ref 142–424)
RBC: 4.83 M/uL (ref 4.04–5.48)
RDW, POC: 16.1 %
WBC: 7.2 10*3/uL (ref 4.6–10.2)

## 2017-02-19 LAB — POC MICROSCOPIC URINALYSIS (UMFC): MUCUS RE: ABSENT

## 2017-02-19 LAB — GLUCOSE, POCT (MANUAL RESULT ENTRY): POC GLUCOSE: 102 mg/dL — AB (ref 70–99)

## 2017-02-19 MED ORDER — DIAZEPAM 5 MG/ML PO CONC: 2.5000 mg | Freq: Two times a day (BID) | ORAL | 0 refills | Status: DC | PRN

## 2017-02-19 MED ORDER — ONDANSETRON HCL 4 MG PO TABS: ORAL_TABLET | ORAL | 3 refills | Status: DC

## 2017-02-19 MED ORDER — DICYCLOMINE HCL 20 MG PO TABS: ORAL_TABLET | ORAL | 0 refills | Status: DC

## 2017-02-19 MED ORDER — SUCRALFATE 1 G PO TABS: ORAL_TABLET | ORAL | 1 refills | Status: DC

## 2017-02-19 MED ORDER — FLUTICASONE PROPIONATE 50 MCG/ACT NA SUSP: 2.0000 | Freq: Every day | NASAL | 2 refills | Status: DC

## 2017-02-19 MED ORDER — DIAZEPAM 2 MG PO TABS: 4.0000 mg | ORAL_TABLET | Freq: Two times a day (BID) | ORAL | 0 refills | Status: DC | PRN

## 2017-02-19 MED ORDER — ALPRAZOLAM 1 MG PO TABS: 1.0000 mg | ORAL_TABLET | Freq: Two times a day (BID) | ORAL | 0 refills | Status: DC | PRN

## 2017-02-19 NOTE — Patient Instructions (Signed)
Eustachian Tube Dysfunction The eustachian tube connects the middle ear to the back of the nose. It regulates air pressure in the middle ear by allowing air to move between the ear and nose. It also helps to drain fluid from the middle ear space. When the eustachian tube does not function properly, air pressure, fluid, or both can build up in the middle ear. Eustachian tube dysfunction can affect one or both ears. What are the causes? This condition happens when the eustachian tube becomes blocked or cannot open normally. This may result from:  Ear infections.  Colds and other upper respiratory infections.  Allergies.  Irritation, such as from cigarette smoke or acid from the stomach coming up into the esophagus (gastroesophageal reflux).  Sudden changes in air pressure, such as from descending in an airplane.  Abnormal growths in the nose or throat, such as nasal polyps, tumors, or enlarged tissue at the back of the throat (adenoids).  What increases the risk? This condition may be more likely to develop in people who smoke and people who are overweight. Eustachian tube dysfunction may also be more likely to develop in children, especially children who have:  Certain birth defects of the mouth, such as cleft palate.  Large tonsils and adenoids.  What are the signs or symptoms? Symptoms of this condition may include:  A feeling of fullness in the ear.  Ear pain.  Clicking or popping noises in the ear.  Ringing in the ear.  Hearing loss.  Loss of balance.  Symptoms may get worse when the air pressure around you changes, such as when you travel to an area of high elevation or fly on an airplane. How is this diagnosed? This condition may be diagnosed based on:  Your symptoms.  A physical exam of your ear, nose, and throat.  Tests, such as those that measure: ? The movement of your eardrum (tympanogram). ? Your hearing (audiometry).  How is this treated? Treatment  depends on the cause and severity of your condition. If your symptoms are mild, you may be able to relieve your symptoms by moving air into ("popping") your ears. If you have symptoms of fluid in your ears, treatment may include:  Decongestants.  Antihistamines.  Nasal sprays or ear drops that contain medicines that reduce swelling (steroids).  In some cases, you may need to have a procedure to drain the fluid in your eardrum (myringotomy). In this procedure, a small tube is placed in the eardrum to:  Drain the fluid.  Restore the air in the middle ear space.  Follow these instructions at home:  Take over-the-counter and prescription medicines only as told by your health care provider.  Use techniques to help pop your ears as recommended by your health care provider. These may include: ? Chewing gum. ? Yawning. ? Frequent, forceful swallowing. ? Closing your mouth, holding your nose closed, and gently blowing as if you are trying to blow air out of your nose.  Do not do any of the following until your health care provider approves: ? Travel to high altitudes. ? Fly in airplanes. ? Work in a pressurized cabin or room. ? Scuba dive.  Keep your ears dry. Dry your ears completely after showering or bathing.  Do not smoke.  Keep all follow-up visits as told by your health care provider. This is important. Contact a health care provider if:  Your symptoms do not go away after treatment.  Your symptoms come back after treatment.  You are   unable to pop your ears.  You have: ? A fever. ? Pain in your ear. ? Pain in your head or neck. ? Fluid draining from your ear.  Your hearing suddenly changes.  You become very dizzy.  You lose your balance. This information is not intended to replace advice given to you by your health care provider. Make sure you discuss any questions you have with your health care provider. Document Released: 05/30/2015 Document Revised: 10/09/2015  Document Reviewed: 05/22/2014 Elsevier Interactive Patient Education  2018 [employer].   Rehydration, Adult Rehydration is the replacement of body fluids and salts and minerals (electrolytes) that are lost during dehydration. Dehydration is when there is not enough fluid or water in the body. This happens when you lose more fluids than you take in. Common causes of dehydration include:  Vomiting.  Diarrhea.  Excessive sweating, such as from heat exposure or exercise.  Taking medicines that cause the body to lose excess fluid (diuretics).  Impaired kidney function.  Not drinking enough fluid.  Certain illnesses or infections.  Certain poorly controlled long-term (chronic) illnesses, such as diabetes, heart disease, and kidney disease.  Symptoms of mild dehydration may include thirst, dry lips and mouth, dry skin, and dizziness. Symptoms of severe dehydration may include increased heart rate, confusion, fainting, and not urinating. You can rehydrate by drinking certain fluids or getting fluids through an IV tube, as told by your health care provider. What are the risks? Generally, rehydration is safe. However, one problem that can happen is taking in too much fluid (overhydration). This is rare. If overhydration happens, it can cause an electrolyte imbalance, kidney failure, or a decrease in salt (sodium) levels in the body. How to rehydrate Follow instructions from your health care provider for rehydration. The kind of fluid you should drink and the amount you should drink depend on your condition.  If directed by your health care provider, drink an oral rehydration solution (ORS). This is a drink designed to treat dehydration that is found in pharmacies and retail stores. ? Make an ORS by following instructions on the package. ? Start by drinking small amounts, about  cup (120 mL) every 5-10 minutes. ? Slowly increase how much you drink until you have taken the amount recommended  by your health care provider.  Drink enough clear fluids to keep your urine clear or pale yellow. If you were instructed to drink an ORS, finish the ORS first, then start slowly drinking other clear fluids. Drink fluids such as: ? Water. Do not drink only water. Doing that can lead to having too little sodium in your body (hyponatremia). ? Ice chips. ? Fruit juice that you have added water to (diluted juice). ? Low-calorie sports drinks.  If you are severely dehydrated, your health care provider may recommend that you receive fluids through an IV tube in the hospital.  Do not take sodium tablets. Doing that can lead to the condition of having too much sodium in your body (hypernatremia).  Eating while you rehydrate Follow instructions from your health care provider about what to eat while you rehydrate. Your health care provider may recommend that you slowly begin eating regular foods in small amounts.  Eat foods that contain a healthy balance of electrolytes, such as bananas, oranges, potatoes, tomatoes, and spinach.  Avoid foods that are greasy or contain a lot of fat or sugar.  In some cases, you may get nutrition through a feeding tube that is passed through your nose and  into your stomach (nasogastric tube, or NG tube). This may be done if you have uncontrolled vomiting or diarrhea. Beverages to avoid Certain beverages may make dehydration worse. While you rehydrate, avoid:  Alcohol.  Caffeine.  Drinks that contain a lot of sugar. These include: ? High-calorie sports drinks. ? Fruit juice that is not diluted. ? Soda.  Check nutrition labels to see how much sugar or caffeine a beverage contains. Signs of dehydration recovery You may be recovering from dehydration if:  You are urinating more often than before you started rehydrating.  Your urine is clear or pale yellow.  Your energy level improves.  You vomit less frequently.  You have diarrhea less frequently.  Your  appetite improves or returns to normal.  You feel less dizzy or less light-headed.  Your skin tone and color start to look more normal.  Contact a health care provider if:  You continue to have symptoms of mild dehydration, such as: ? Thirst. ? Dry lips. ? Slightly dry mouth. ? Dry, warm skin. ? Dizziness.  You continue to vomit or have diarrhea. Get help right away if:  You have symptoms of dehydration that get worse.  You feel: ? Confused. ? Weak. ? Like you are going to faint.  You have not urinated in 6-8 hours.  You have very dark urine.  You have trouble breathing.  Your heart rate while sitting still is over 100 beats a minute.  You cannot drink fluids without vomiting.  You have vomiting or diarrhea that: ? Gets worse. ? Does not go away.  You have a fever. This information is not intended to replace advice given to you by your health care provider. Make sure you discuss any questions you have with your health care provider. Document Released: 07/26/2011 Document Revised: 11/21/2015 Document Reviewed: 06/27/2015 Elsevier Interactive Patient Education  2018 [employer].   Dizziness Dizziness is a common problem. It is a feeling of unsteadiness or light-headedness. You may feel like you are about to faint. Dizziness can lead to injury if you stumble or fall. Anyone can become dizzy, but dizziness is more common in older adults. This condition can be caused by a number of things, including medicines, dehydration, or illness. Follow these instructions at home: Taking these steps may help with your condition: Eating and drinking  Drink enough fluid to keep your urine clear or pale yellow. This helps to keep you from becoming dehydrated. Try to drink more clear fluids, such as water.  Do not drink alcohol.  Limit your caffeine intake if directed by your health care provider.  Limit your salt intake if directed by your health care  provider. Activity  Avoid making quick movements. ? Rise slowly from chairs and steady yourself until you feel okay. ? In the morning, first sit up on the side of the bed. When you feel okay, stand slowly while you hold onto something until you know that your balance is fine.  Move your legs often if you need to stand in one place for a long time. Tighten and relax your muscles in your legs while you are standing.  Do not drive or operate heavy machinery if you feel dizzy.  Avoid bending down if you feel dizzy. Place items in your home so that they are easy for you to reach without leaning over. Lifestyle  Do not use any tobacco products, including cigarettes, chewing tobacco, or electronic cigarettes. If you need help quitting, ask your health care provider.  Try to reduce your stress level, such as with yoga or meditation. Talk with your health care provider if you need help. General instructions  Watch your dizziness for any changes.  Take medicines only as directed by your health care provider. Talk with your health care provider if you think that your dizziness is caused by a medicine that you are taking.  Tell a friend or a family member that you are feeling dizzy. If he or she notices any changes in your behavior, have this person call your health care provider.  Keep all follow-up visits as directed by your health care provider. This is important. Contact a health care provider if:  Your dizziness does not go away.  Your dizziness or light-headedness gets worse.  You feel nauseous.  You have reduced hearing.  You have new symptoms.  You are unsteady on your feet or you feel like the room is spinning. Get help right away if:  You vomit or have diarrhea and are unable to eat or drink anything.  You have problems talking, walking, swallowing, or using your arms, hands, or legs.  You feel generally weak.  You are not thinking clearly or you have trouble forming  sentences. It may take a friend or family member to notice this.  You have chest pain, abdominal pain, shortness of breath, or sweating.  Your vision changes.  You notice any bleeding.  You have a headache.  You have neck pain or a stiff neck.  You have a fever. This information is not intended to replace advice given to you by your health care provider. Make sure you discuss any questions you have with your health care provider. Document Released: 10/27/2000 Document Revised: 10/09/2015 Document Reviewed: 04/29/2014 Elsevier Interactive Patient Education  2017 [employer].

## 2017-02-19 NOTE — Progress Notes (Signed)
Subjective:  By signing my name below, I, Jennifer Davidson, attest that this documentation has been prepared under the direction and in the presence of Delman Cheadle, MD. Electronically Signed: Moises Davidson, [place]. 02/19/2017 , 9:17 AM .  Patient was seen in Room 2 .   Patient ID: Jennifer Davidson, female    DOB: Jul 28, 1955, 61 y.o.   MRN: [id number] Chief Complaint  Patient presents with  . Medication Refill  . Dizziness    started yesterday    HPI Jennifer Davidson is a 61 y.o. female who presents to Primary Care at [facility] for 2 month follow up of her chronic medical conditions.   Dizziness She reports dizziness that started about a week ago, when she fell and hit her arm in her bedroom feeling unbalanced. She also fell today when standing up during triage, also feeling off balance. She's been exhausted after helping her daughter. She's been eating well, and drinks plenty of fluids throughout the day.   Patient was doing well for past 2 months. She went to help her daughter since she had back surgery. Afterwards, she had diarrhea and vomiting for about 2 weeks, which has resolved.   Chronic right lower extremity pain Unsuccessfully tried to obtain topical NSAIDs for her right lower extremity pain. Patient has failed topical lidocaine patches and numerous other meds inc gabapentin (still on), cymbalta (still on), APAP, amitriptyline, buprenorphine, cyclobenzaprine, hydrocodone, methocarbamol, morphine, oxycodone, and injections. Can't use systemic nsaids due to CKDII from chronic nsaid od to treat pain but eGFR 40-50 so ok to use topical.   She has a brace for her right knee. It has given way many times. She's been applying topical Voltaren gel over her right knee.   Hyperlipidemia She was previously on statin but stopped years prior to see if extremity pain improved, which has not. Her non HDL was 270 3 months prior, so recommended restarting atorvastatin.   Back pain from workers comp She's been  receiving injections by Dr. Jacelyn Grip and wearing a back brace. She hasn't had an injection in a while. She hasn't been wearing her back brace because it started to become uncomfortable; although, she reports it does help when she knows she has to walk/stand for a long period of time.   Insomnia and OSA Maintaining patient's regular sleep is essential to her mental health stability, demonstrated many times prior. When she is up for several days in a row, her husband will treat her with diazepam 61m and hydroxyzine 1031mto successfully induce sleep.   She's been wearing her cpap. Her sleep has improved due to feeling exhausted after helping her daughter.   Her diazepam scheduled to twice a day, half alprazolam in the afternoon, and 1 tablet at night. She has a half tablet as needed when she gets "jittery" which is rare. She has not needed the liquid valium. She denies any seizure-like episodes.   Mood disorder She is on cymbalta 6057msignificantly worsened when increased this dose prior with developing conversion disorder with epileptic episodes. She's seen multiple psychiatrists and multiple hospitalizations prior; failed numerous medications, though symptoms in partial remission but still severe. Was considering TCMS or ketamine infusions but out of pocket cost $3000.   IBSD  Controlled with PRN lomotil.   Past Medical History:  Diagnosis Date  . Anxiety   . Cholesterol serum increased   . Chronic back pain   . Chronic kidney disease   . Depression   . Diastolic heart failure (  HCC)   . GERD (gastroesophageal reflux disease)   . Hyperparathyroidism ([place])   . Hypertension   . Hypothyroidism   . Migraine   . Neuromuscular disorder ([place])   . Sleep apnea   . Tachycardia   . Trochanteric bursitis of both hips 2012   Confirmed on MRI   Prior to Admission medications   Medication Sig Start Date End Date Taking? Authorizing Provider  ALPRAZolam Duanne Moron) 1 MG tablet Take 1 tablet (1 mg total)  by mouth 2 (two) times daily as needed for anxiety. 12/09/16   Shawnee Knapp, MD  azelastine (ASTELIN) 0.1 % nasal spray Place 1 spray into both nostrils 2 (two) times daily. Use in each nostril as directed 09/09/16   Shawnee Knapp, MD  Calcium-Magnesium-Vitamin D (CALCIUM 1200+D3 PO) Take 1 tablet by mouth daily. Contains 800iu Vitamin D3    [provider]  cholecalciferol (VITAMIN D) 1000 UNITS tablet Take 1 tablet (1,000 Units total) by mouth daily at 12 noon. 12/05/14   Kerrie Buffalo, NP  diazepam (DIAZEPAM INTENSOL) 5 MG/ML solution Take 0.5 mLs (2.5 mg total) by mouth every 12 (twelve) hours as needed (seizure-like activity). 10/25/16   Shawnee Knapp, MD  diazepam (VALIUM) 2 MG tablet Take 2 tablets (4 mg total) by mouth every 12 (twelve) hours as needed for anxiety. 12/09/16   Shawnee Knapp, MD  diclofenac (FLECTOR) 1.3 % PTCH Place 1 patch onto the skin 2 (two) times daily. 12/17/16   Shawnee Knapp, MD  dicyclomine (BENTYL) 20 MG tablet TAKE 1 TABLET FOUR TIMES A DAY BEFORE MEALS AND AT BEDTIME AS NEEDED FOR CRAMPING IRRITABLE BOWELS 11/27/15   Shawnee Knapp, MD  diphenoxylate-atropine (LOMOTIL) 2.5-0.025 MG tablet Take 1 tablet by mouth 4 (four) times daily as needed for diarrhea or loose stools. 12/09/16   Shawnee Knapp, MD  DULoxetine (CYMBALTA) 60 MG capsule Take 1 capsule (60 mg total) by mouth daily. 12/09/16   Shawnee Knapp, MD  esomeprazole (NEXIUM) 40 MG capsule TAKE 1 CAPSULE (40 MG TOTAL) BY MOUTH DAILY. 12/09/16   Shawnee Knapp, MD  fluticasone (FLONASE) 50 MCG/ACT nasal spray Place 2 sprays into both nostrils at bedtime. 09/09/16   Shawnee Knapp, MD  furosemide (LASIX) 40 MG tablet Take 1 tablet (40 mg total) by mouth daily. 12/09/16   Shawnee Knapp, MD  gabapentin (NEURONTIN) 400 MG capsule TAKE 1 CAPSULE THREE TIMES DAILY 12/09/16   Shawnee Knapp, MD  Guaifenesin [facility] MAXIMUM STRENGTH) 1200 MG TB12 Take 1 tablet (1,200 mg total) by mouth every 12 (twelve) hours as needed. 09/09/16   Shawnee Knapp, MD    hydrOXYzine (ATARAX/VISTARIL) 50 MG tablet Take 2 tablets (100 mg total) by mouth at bedtime. May repeat in 6 hours if needed. 12/09/16   Shawnee Knapp, MD  levothyroxine (SYNTHROID, LEVOTHROID) 50 MCG tablet Take 1 tablet (50 mcg total) by mouth daily. 12/09/16   Shawnee Knapp, MD  lidocaine (LIDODERM) 5 % PLACE 1 PATCH ONTO THE SKIN DAILY. REMOVE & DISCARD PATCH WITHIN 12 HOURS OR AS DIRECTED BY MD 04/12/15   [provider]  Nutritional Supplements (EQ ESTROBLEND MENOPAUSE PO) Take by mouth.    [provider]  ondansetron (ZOFRAN) 4 MG tablet TAKE 1 TABLET BY MOUTH EVERY 8 HOURS AS NEEDED FOR NAUSEA/VOMITING 12/09/16   Shawnee Knapp, MD  Potassium Chloride ER 20 MEQ TBCR Take 1 tablet by mouth daily. With lasix 12/09/16  Shawnee Knapp, MD  predniSONE (DELTASONE) 20 MG tablet Take 1 tablet (20 mg total) by mouth daily with breakfast. 12/16/16   Shawnee Knapp, MD  Probiotic Product (PROBIOTIC ACIDOPHILUS BEADS PO) Take by mouth.    [provider]  QUEtiapine (SEROQUEL) 50 MG tablet Take 1-2 tablets (50-100 mg total) by mouth at bedtime. As directed by physician 12/09/16   Shawnee Knapp, MD  rizatriptan (MAXALT) 10 MG tablet Take 0.5 tablets (5 mg total) by mouth as needed for migraine. May repeat in 2 hours if needed 12/09/16   Shawnee Knapp, MD  sucralfate (CARAFATE) 1 g tablet TAKE 1 TABLET FOUR TIMES DAILY WITH MEALS AND AT BEDTIME. 06/09/16   Ivar Drape D, PA  topiramate (TOPAMAX) 100 MG tablet TAKE 1/2 TABLET  IN THE MORNING AND 1 TABLET AT BEDTIME 12/09/16   Shawnee Knapp, MD  triamcinolone cream (KENALOG) 0.1 % Apply 1 application topically 4 (four) times daily. For itching rash 12/16/16   Shawnee Knapp, MD   Allergies  Allergen Reactions  . Tramadol Nausea And Vomiting  . Trazodone And Nefazodone Other (See Comments)    Per pt trazodone caused hallucinations and behavior changes    Past Surgical History:  Procedure Laterality Date  . ABDOMINAL HYSTERECTOMY    . APPENDECTOMY     . BACK SURGERY    . KNEE SURGERY     Family History  Problem Relation Age of Onset  . Diabetes Mother   . Heart disease Mother   . Kidney disease Mother   . Thyroid disease Mother   . Heart disease Father   . Seizures Father   . COPD Father   . Cancer Maternal Grandmother    Social History   Social History  . Marital status: Married    Spouse name: N/A  . Number of children: N/A  . Years of education: N/A   Social History Main Topics  . Smoking status: Never Smoker  . Smokeless tobacco: Never Used  . Alcohol use No     Comment: 08-31-2016 PER PT NO  . Drug use: No     Comment: 08-31-2016 PER PT NO   . Sexual activity: Yes    Partners: Male     Comment: married   Other Topics Concern  . None   Social History Narrative  . None   Depression screen [facility] 2/9 02/19/2017 12/16/2016 12/09/2016 11/08/2016 10/14/2016  Decreased Interest 0 0 0 1 0  Down, Depressed, Hopeless 0 0 0 1 0  PHQ - 2 Score 0 0 0 2 0  Altered sleeping - - - 0 -  Tired, decreased energy - - - 1 -  Change in appetite - - - 1 -  Feeling bad or failure about yourself  - - - 1 -  Trouble concentrating - - - 0 -  Moving slowly or fidgety/restless - - - 0 -  Suicidal thoughts - - - 0 -  PHQ-9 Score - - - 5 -  Difficult doing work/chores - - - Somewhat difficult -  Some recent data might be hidden     Review of Systems  Constitutional: Negative for chills, fatigue, fever and unexpected weight change.  Respiratory: Negative for cough.   Gastrointestinal: Negative for constipation, diarrhea, nausea and vomiting.  Musculoskeletal: Positive for arthralgias, back pain and gait problem.  Skin: Negative for rash and wound.  Neurological: Positive for dizziness. Negative for weakness and headaches.  Objective:   Physical Exam  Constitutional: She is oriented to person, place, and time. She appears well-developed and well-nourished. No distress.  HENT:  Head: Normocephalic and atraumatic.  Right  Ear: Tympanic membrane is injected.  Left Ear: A middle ear effusion is present.  Turbinates: pale boggy mucosa  Eyes: Pupils are equal, round, and reactive to light. EOM are normal.  Neck: Neck supple.  Cardiovascular: Normal rate, regular rhythm and normal heart sounds.   Pulmonary/Chest: Effort normal and breath sounds normal. No respiratory distress. She has no wheezes.  Musculoskeletal: Normal range of motion. She exhibits no edema.  Neurological: She is alert and oriented to person, place, and time.  Negative pronator drift; positive romberg Gait: slowed shuffling, difficulty placing right leg  Skin: Skin is warm and dry.  Psychiatric: She has a normal mood and affect. Her behavior is normal.  Nursing note and vitals reviewed.   BP 118/74   Pulse 74   Temp 97.8 F (36.6 C)   Resp 16   Ht 5' 3"  (1.6 m)   Wt 208 lb (94.3 kg)   SpO2 96%   BMI 36.85 kg/m    Orthostatic VS for the past 24 hrs (Last 3 readings):  BP- Lying Pulse- Lying BP- Sitting Pulse- Sitting BP- Standing at 0 minutes Pulse- Standing at 0 minutes BP- Standing at 3 minutes Pulse- Standing at 3 minutes  02/19/17 0846 118/74 76 102/60 76 104/70 84 116/76 85   Results for orders placed or performed in visit on 02/19/17  POCT urinalysis dipstick  Result Value Ref Range   Color, UA yellow yellow   Clarity, UA clear clear   Glucose, UA negative negative mg/dL   Bilirubin, UA negative negative   Ketones, POC UA negative negative mg/dL   Spec Grav, UA 1.015 1.010 - 1.025   [place]son, UA negative negative   pH, UA 7.0 5.0 - 8.0   Protein Ur, POC negative negative mg/dL   Urobilinogen, UA 1.0 0.2 or 1.0 E.U./dL   Nitrite, UA Negative Negative   Leukocytes, UA Negative Negative  POCT Microscopic Urinalysis (UMFC)  Result Value Ref Range   WBC,UR,HPF,POC Few (A) None WBC/hpf   RBC,UR,HPF,POC None None RBC/hpf   Bacteria Few (A) None, Too numerous to count   Mucus Absent Absent   Epithelial Cells, UR Per  Microscopy Few (A) None, Too numerous to count cells/hpf  POCT CBC  Result Value Ref Range   WBC 7.2 4.6 - 10.2 K/uL   Lymph, poc 2.8 0.6 - 3.4   POC LYMPH PERCENT 39.1 10 - 50 %L   MID (cbc) 0.4 0 - 0.9   POC MID % 5.3 0 - 12 %M   POC Granulocyte 4.0 2 - 6.9   Granulocyte percent 55.6 37 - 80 %G   RBC 4.83 4.04 - 5.48 M/uL   Hemoglobin 12.5 12.2 - 16.2 g/dL   HCT, POC 38.7 37.7 - 47.9 %   MCV 80.1 80 - 97 fL   MCH, POC 25.9 (A) 27 - 31.2 pg   MCHC 32.3 31.8 - 35.4 g/dL   RDW, POC 16.1 %   Platelet Count, POC 286 142 - 424 K/uL   MPV 6.4 0 - 99.8 fL  POCT glucose (manual entry)  Result Value Ref Range   POC Glucose 102 (A) 70 - 99 mg/dl       Assessment & Plan:   1. Dizziness and giddiness - - orthostatic lightheadedness - suspect multifactorial - does have some  fluid in middle ear/poss sinus congestion, slightly orthostatic vs, fatigue, and right leg/knee pain/instability  2. Fall in home, initial encounter - start wearing right knee brace, resume bid voltaren gel, ice. If cont, needs to f/u w/ ortho.  3. Diarrhea, unspecified type   4. Seasonal allergies - cont azelastine bid nasal spray and flonase qhs  5. Irritable bowel syndrome with diarrhea - ok to try weaning down/off sulcrafate  6. Insomnia due to other mental disorder - wearing CPAP, sleeping well now that at home - aware of importance of sleep.  7. Prediabetes   8. Acquired hypothyroidism    No med changes. OK to refill any meds x 6 mos. F/u OV in 3 mos.  Orders Placed This Encounter  Procedures  . Urine Culture  . Comprehensive metabolic panel  . TSH  . Hemoglobin A1c  . Hemoglobin A1c  . TSH  . POCT urinalysis dipstick  . POCT Microscopic Urinalysis (UMFC)  . POCT CBC  . POCT glucose (manual entry)    Meds ordered this encounter  Medications  . diazepam (VALIUM) 2 MG tablet    Sig: Take 2 tablets (4 mg total) by mouth every 12 (twelve) hours as needed for anxiety.    Dispense:  360 tablet     Refill:  0  . ALPRAZolam (XANAX) 1 MG tablet    Sig: Take 1 tablet (1 mg total) by mouth 2 (two) times daily as needed for anxiety.    Dispense:  180 tablet    Refill:  0    Please fax to [facility] [place]: [phone]  . diazepam (DIAZEPAM INTENSOL) 5 MG/ML solution    Sig: Take 0.5 mLs (2.5 mg total) by mouth every 12 (twelve) hours as needed (seizure-like activity).    Dispense:  30 mL    Refill:  0    Not to exceed 5 additional fills before 03/14/2017  . dicyclomine (BENTYL) 20 MG tablet    Sig: TAKE 1 TABLET FOUR TIMES A DAY BEFORE MEALS AND AT BEDTIME AS NEEDED FOR CRAMPING IRRITABLE BOWELS    Dispense:  180 tablet    Refill:  0  . fluticasone (FLONASE) 50 MCG/ACT nasal spray    Sig: Place 2 sprays into both nostrils at bedtime.    Dispense:  16 g    Refill:  2  . ondansetron (ZOFRAN) 4 MG tablet    Sig: TAKE 1 TABLET BY MOUTH EVERY 8 HOURS AS NEEDED FOR NAUSEA/VOMITING    Dispense:  60 tablet    Refill:  3  . sucralfate (CARAFATE) 1 g tablet    Sig: TAKE 1 TABLET FOUR TIMES DAILY WITH MEALS AND AT BEDTIME for heartburn, indigestion, and abdominal pain    Dispense:  360 tablet    Refill:  1    I personally performed the services described in this documentation, which was scribed in my presence. The recorded information has been reviewed and considered, and addended by me as needed.   Delman Cheadle, M.D.  Primary Care at [facility] [address] [place], [place] [address] [phone] phone [phone] fax  02/20/17 9:42 PM

## 2017-02-20 LAB — COMPREHENSIVE METABOLIC PANEL
A/G RATIO: 1.6 (ref 1.2–2.2)
ALBUMIN: 4.2 g/dL (ref 3.6–4.8)
ALK PHOS: 85 IU/L (ref 39–117)
ALT: 19 IU/L (ref 0–32)
AST: 16 IU/L (ref 0–40)
BUN / CREAT RATIO: 12 (ref 12–28)
BUN: 20 mg/dL (ref 8–27)
Bilirubin Total: 0.3 mg/dL (ref 0.0–1.2)
CO2: 29 mmol/L (ref 20–29)
CREATININE: 1.71 mg/dL — AB (ref 0.57–1.00)
Calcium: 9.3 mg/dL (ref 8.7–10.3)
Chloride: 102 mmol/L (ref 96–106)
GFR calc Af Amer: 36 mL/min/{1.73_m2} — ABNORMAL LOW (ref >59)
GFR calc non Af Amer: 31 mL/min/{1.73_m2} — ABNORMAL LOW (ref >59)
GLOBULIN, TOTAL: 2.7 g/dL (ref 1.5–4.5)
Glucose: 108 mg/dL — ABNORMAL HIGH (ref 65–99)
Potassium: 4.3 mmol/L (ref 3.5–5.2)
SODIUM: 141 mmol/L (ref 134–144)
Total Protein: 6.9 g/dL (ref 6.0–8.5)

## 2017-02-20 LAB — URINE CULTURE

## 2017-02-20 LAB — HEMOGLOBIN A1C
ESTIMATED AVERAGE GLUCOSE: 126 mg/dL
HEMOGLOBIN A1C: 6 % — AB (ref 4.8–5.6)

## 2017-02-20 LAB — TSH: TSH: 2.81 u[IU]/mL (ref 0.450–4.500)

## 2017-02-25 ENCOUNTER — Other Ambulatory Visit: Payer: Self-pay | Admitting: Family Medicine

## 2017-02-28 ENCOUNTER — Telehealth: Payer: Self-pay

## 2017-02-28 NOTE — Telephone Encounter (Signed)
Started PA through cover my meds for diazepam. Key is [id number].  Recheck in 3 business days.

## 2017-03-01 NOTE — Telephone Encounter (Signed)
4 mo supply was sent to [facility] on 7/26 (#120 with 3 refills)  so should not need refill until end of November at very soonest and that is if she is taking the max amount every day of this prn med.  This rx is from a Insurance claims handler. Please see if pt actually needs this or if it was a pharmacy generated request and if they would like to get the rx that is on file with [facility] already or should we cancel this and [facility]?

## 2017-03-02 NOTE — Telephone Encounter (Signed)
Received approval for diazepam #360.  Good through 03/01/2018. Patient notified.

## 2017-03-02 NOTE — Telephone Encounter (Signed)
IC patient.  LMOVM  With Dr. Alver FisherShaw's message. Asked pt to return call if she wants through local pharmacy and we will [facility] and re-order.

## 2017-03-08 ENCOUNTER — Other Ambulatory Visit: Payer: Self-pay | Admitting: Family Medicine

## 2017-03-08 NOTE — Telephone Encounter (Signed)
[facility] w/ several refills. Please call pt or her husband Jennifer Davidson to see if she wants [employer] rx cancelled and this sent to local pharmacy instead.

## 2017-03-09 ENCOUNTER — Other Ambulatory Visit: Payer: Self-pay | Admitting: Family Medicine

## 2017-03-10 ENCOUNTER — Other Ambulatory Visit: Payer: Self-pay

## 2017-03-10 NOTE — Telephone Encounter (Signed)
See phone msg.  Refused Lomotil - [facility]

## 2017-03-10 NOTE — Telephone Encounter (Signed)
IC LMOVM for pt to advise if she [facility]. [facility]

## 2017-03-10 NOTE — Telephone Encounter (Signed)
[facility] [place] and asked that they inform pt of refill availability.

## 2017-03-10 NOTE — Telephone Encounter (Signed)
Pt's husband texted me. They would like to get the med at the local pharm as needs now.  [facility] - was unaware that this med had been sent there so I don't know if [employer] ever got pharm.

## 2017-03-14 DIAGNOSIS — M533 Sacrococcygeal disorders, not elsewhere classified: Secondary | ICD-10-CM | POA: Diagnosis not present

## 2017-03-14 DIAGNOSIS — M545 Low back pain: Secondary | ICD-10-CM | POA: Diagnosis not present

## 2017-03-17 ENCOUNTER — Other Ambulatory Visit: Payer: Self-pay | Admitting: Orthopedic Surgery

## 2017-03-17 DIAGNOSIS — M533 Sacrococcygeal disorders, not elsewhere classified: Secondary | ICD-10-CM | POA: Insufficient documentation

## 2017-03-24 ENCOUNTER — Ambulatory Visit
Admission: RE | Admit: 2017-03-24 | Discharge: 2017-03-24 | Disposition: A | Discharge status: Home / Self Care | Payer: Medicare HMO | Source: Ambulatory Visit | Attending: Orthopedic Surgery | Admitting: Orthopedic Surgery

## 2017-03-24 DIAGNOSIS — M533 Sacrococcygeal disorders, not elsewhere classified: Secondary | ICD-10-CM | POA: Diagnosis not present

## 2017-03-25 ENCOUNTER — Telehealth: Payer: Self-pay

## 2017-03-25 MED ORDER — FLUTICASONE PROPIONATE 50 MCG/ACT NA SUSP: 2.0000 | Freq: Every day | NASAL | 0 refills | Status: DC

## 2017-03-27 NOTE — Telephone Encounter (Signed)
Yes, that's fine. You have already signed the order and sent it to the pharmacy.

## 2017-03-29 ENCOUNTER — Telehealth: Payer: Self-pay | Admitting: Family Medicine

## 2017-03-29 MED ORDER — QUETIAPINE FUMARATE 50 MG PO TABS
100.0000 mg | ORAL_TABLET | Freq: Every day | ORAL | 0 refills | Status: DC
Start: 2017-03-29 — End: 2017-04-10

## 2017-03-29 MED ORDER — GABAPENTIN 400 MG PO CAPS: 400.0000 mg | ORAL_CAPSULE | Freq: Four times a day (QID) | ORAL | 1 refills | Status: DC

## 2017-03-29 MED ORDER — DIAZEPAM 2 MG PO TABS: ORAL_TABLET | ORAL | 0 refills | Status: DC
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
## 2017-04-01 DIAGNOSIS — M533 Sacrococcygeal disorders, not elsewhere classified: Secondary | ICD-10-CM | POA: Diagnosis not present

## 2017-04-04 DIAGNOSIS — M533 Sacrococcygeal disorders, not elsewhere classified: Secondary | ICD-10-CM | POA: Diagnosis not present

## 2017-04-08 ENCOUNTER — Ambulatory Visit: Payer: Medicare HMO | Admitting: Family Medicine

## 2017-04-08 ENCOUNTER — Observation Stay (HOSPITAL_COMMUNITY)
Admission: AD | Admit: 2017-04-08 | Discharge: 2017-04-10 | Disposition: A | Discharge status: Home / Self Care | Payer: Medicare HMO | Source: Ambulatory Visit | Attending: Internal Medicine | Admitting: Internal Medicine

## 2017-04-08 ENCOUNTER — Other Ambulatory Visit: Payer: Self-pay | Admitting: Obstetrics and Gynecology

## 2017-04-08 ENCOUNTER — Other Ambulatory Visit: Payer: Self-pay

## 2017-04-08 VITALS — BP 114/76 | HR 85 | Temp 97.5°F | Resp 18 | Ht 63.0 in | Wt 214.4 lb

## 2017-04-08 DIAGNOSIS — Z79890 Hormone replacement therapy (postmenopausal): Secondary | ICD-10-CM | POA: Insufficient documentation

## 2017-04-08 DIAGNOSIS — Z7951 Long term (current) use of inhaled steroids: Secondary | ICD-10-CM | POA: Diagnosis not present

## 2017-04-08 DIAGNOSIS — K529 Noninfective gastroenteritis and colitis, unspecified: Secondary | ICD-10-CM | POA: Insufficient documentation

## 2017-04-08 DIAGNOSIS — F333 Major depressive disorder, recurrent, severe with psychotic symptoms: Secondary | ICD-10-CM | POA: Diagnosis not present

## 2017-04-08 DIAGNOSIS — N183 Chronic kidney disease, stage 3 unspecified: Secondary | ICD-10-CM

## 2017-04-08 DIAGNOSIS — R569 Unspecified convulsions: Secondary | ICD-10-CM

## 2017-04-08 DIAGNOSIS — Z888 Allergy status to other drugs, medicaments and biological substances status: Secondary | ICD-10-CM | POA: Diagnosis not present

## 2017-04-08 DIAGNOSIS — R410 Disorientation, unspecified: Secondary | ICD-10-CM | POA: Diagnosis not present

## 2017-04-08 DIAGNOSIS — R159 Full incontinence of feces: Secondary | ICD-10-CM | POA: Diagnosis not present

## 2017-04-08 DIAGNOSIS — I130 Hypertensive heart and chronic kidney disease with heart failure and stage 1 through stage 4 chronic kidney disease, or unspecified chronic kidney disease: Secondary | ICD-10-CM | POA: Insufficient documentation

## 2017-04-08 DIAGNOSIS — Z90710 Acquired absence of both cervix and uterus: Secondary | ICD-10-CM | POA: Diagnosis not present

## 2017-04-08 DIAGNOSIS — R197 Diarrhea, unspecified: Secondary | ICD-10-CM

## 2017-04-08 DIAGNOSIS — N390 Urinary tract infection, site not specified: Secondary | ICD-10-CM | POA: Diagnosis present

## 2017-04-08 DIAGNOSIS — Z841 Family history of disorders of kidney and ureter: Secondary | ICD-10-CM | POA: Insufficient documentation

## 2017-04-08 DIAGNOSIS — R112 Nausea with vomiting, unspecified: Secondary | ICD-10-CM | POA: Diagnosis not present

## 2017-04-08 DIAGNOSIS — Z833 Family history of diabetes mellitus: Secondary | ICD-10-CM | POA: Insufficient documentation

## 2017-04-08 DIAGNOSIS — F419 Anxiety disorder, unspecified: Secondary | ICD-10-CM | POA: Diagnosis not present

## 2017-04-08 DIAGNOSIS — Z79899 Other long term (current) drug therapy: Secondary | ICD-10-CM | POA: Diagnosis not present

## 2017-04-08 DIAGNOSIS — N189 Chronic kidney disease, unspecified: Secondary | ICD-10-CM | POA: Diagnosis present

## 2017-04-08 DIAGNOSIS — R0789 Other chest pain: Secondary | ICD-10-CM

## 2017-04-08 DIAGNOSIS — F4310 Post-traumatic stress disorder, unspecified: Secondary | ICD-10-CM | POA: Diagnosis not present

## 2017-04-08 DIAGNOSIS — I10 Essential (primary) hypertension: Secondary | ICD-10-CM | POA: Diagnosis present

## 2017-04-08 DIAGNOSIS — I5032 Chronic diastolic (congestive) heart failure: Secondary | ICD-10-CM | POA: Insufficient documentation

## 2017-04-08 DIAGNOSIS — M549 Dorsalgia, unspecified: Secondary | ICD-10-CM | POA: Insufficient documentation

## 2017-04-08 DIAGNOSIS — R1013 Epigastric pain: Secondary | ICD-10-CM

## 2017-04-08 DIAGNOSIS — G4730 Sleep apnea, unspecified: Secondary | ICD-10-CM | POA: Insufficient documentation

## 2017-04-08 DIAGNOSIS — E039 Hypothyroidism, unspecified: Secondary | ICD-10-CM | POA: Diagnosis present

## 2017-04-08 DIAGNOSIS — G8929 Other chronic pain: Secondary | ICD-10-CM | POA: Diagnosis not present

## 2017-04-08 DIAGNOSIS — G9340 Encephalopathy, unspecified: Secondary | ICD-10-CM | POA: Diagnosis not present

## 2017-04-08 DIAGNOSIS — Z836 Family history of other diseases of the respiratory system: Secondary | ICD-10-CM | POA: Insufficient documentation

## 2017-04-08 DIAGNOSIS — M533 Sacrococcygeal disorders, not elsewhere classified: Secondary | ICD-10-CM | POA: Insufficient documentation

## 2017-04-08 DIAGNOSIS — F339 Major depressive disorder, recurrent, unspecified: Secondary | ICD-10-CM | POA: Diagnosis present

## 2017-04-08 DIAGNOSIS — Z885 Allergy status to narcotic agent status: Secondary | ICD-10-CM | POA: Diagnosis not present

## 2017-04-08 DIAGNOSIS — F39 Unspecified mood [affective] disorder: Secondary | ICD-10-CM | POA: Diagnosis present

## 2017-04-08 DIAGNOSIS — Z8249 Family history of ischemic heart disease and other diseases of the circulatory system: Secondary | ICD-10-CM | POA: Insufficient documentation

## 2017-04-08 DIAGNOSIS — Z9049 Acquired absence of other specified parts of digestive tract: Secondary | ICD-10-CM | POA: Diagnosis not present

## 2017-04-08 DIAGNOSIS — K219 Gastro-esophageal reflux disease without esophagitis: Secondary | ICD-10-CM | POA: Insufficient documentation

## 2017-04-08 DIAGNOSIS — Z9889 Other specified postprocedural states: Secondary | ICD-10-CM | POA: Insufficient documentation

## 2017-04-08 DIAGNOSIS — Z820 Family history of epilepsy and other diseases of the nervous system: Secondary | ICD-10-CM | POA: Insufficient documentation

## 2017-04-08 DIAGNOSIS — F32A Depression, unspecified: Secondary | ICD-10-CM | POA: Diagnosis present

## 2017-04-08 DIAGNOSIS — F341 Dysthymic disorder: Secondary | ICD-10-CM | POA: Insufficient documentation

## 2017-04-08 DIAGNOSIS — N2581 Secondary hyperparathyroidism of renal origin: Secondary | ICD-10-CM | POA: Diagnosis present

## 2017-04-08 DIAGNOSIS — R443 Hallucinations, unspecified: Secondary | ICD-10-CM | POA: Diagnosis present

## 2017-04-08 DIAGNOSIS — E876 Hypokalemia: Secondary | ICD-10-CM | POA: Diagnosis not present

## 2017-04-08 DIAGNOSIS — Z8349 Family history of other endocrine, nutritional and metabolic diseases: Secondary | ICD-10-CM | POA: Insufficient documentation

## 2017-04-08 DIAGNOSIS — N1832 Chronic kidney disease, stage 3b: Secondary | ICD-10-CM | POA: Diagnosis present

## 2017-04-08 DIAGNOSIS — F329 Major depressive disorder, single episode, unspecified: Secondary | ICD-10-CM | POA: Diagnosis present

## 2017-04-08 LAB — COMPREHENSIVE METABOLIC PANEL
ALBUMIN: 3.8 g/dL (ref 3.5–5.0)
ALK PHOS: 70 U/L (ref 38–126)
ALT: 22 U/L (ref 14–54)
AST: 23 U/L (ref 15–41)
Anion gap: 10 (ref 5–15)
BUN: 16 mg/dL (ref 6–20)
CALCIUM: 9.6 mg/dL (ref 8.9–10.3)
CHLORIDE: 102 mmol/L (ref 101–111)
CO2: 28 mmol/L (ref 22–32)
CREATININE: 1.54 mg/dL — AB (ref 0.44–1.00)
GFR calc Af Amer: 40 mL/min — ABNORMAL LOW (ref >60)
GFR calc non Af Amer: 35 mL/min — ABNORMAL LOW (ref >60)
GLUCOSE: 105 mg/dL — AB (ref 65–99)
Potassium: 3.2 mmol/L — ABNORMAL LOW (ref 3.5–5.1)
SODIUM: 140 mmol/L (ref 135–145)
Total Bilirubin: 0.6 mg/dL (ref 0.3–1.2)
Total Protein: 7.5 g/dL (ref 6.5–8.1)

## 2017-04-08 LAB — POC MICROSCOPIC URINALYSIS (UMFC): MUCUS RE: ABSENT

## 2017-04-08 LAB — BLOOD GAS, VENOUS
Acid-Base Excess: 2.6 mmol/L — ABNORMAL HIGH (ref 0.0–2.0)
BICARBONATE: 28.8 mmol/L — AB (ref 20.0–28.0)
FIO2: 21
O2 Saturation: 62.7 %
PH VEN: 7.344 (ref 7.250–7.430)
PO2 VEN: 35.1 mmHg (ref 32.0–45.0)
Patient temperature: 37
pCO2, Ven: 54.3 mmHg (ref 44.0–60.0)

## 2017-04-08 LAB — CBC WITH DIFFERENTIAL/PLATELET
BASOS PCT: 1 %
Basophils Absolute: 0.1 10*3/uL (ref 0.0–0.1)
EOS ABS: 0.4 10*3/uL (ref 0.0–0.7)
EOS PCT: 5 %
HCT: 38.3 % (ref 36.0–46.0)
HEMOGLOBIN: 12 g/dL (ref 12.0–15.0)
LYMPHS ABS: 2.7 10*3/uL (ref 0.7–4.0)
Lymphocytes Relative: 33 %
MCH: 26 pg (ref 26.0–34.0)
MCHC: 31.3 g/dL (ref 30.0–36.0)
MCV: 82.9 fL (ref 78.0–100.0)
MONO ABS: 0.6 10*3/uL (ref 0.1–1.0)
MONOS PCT: 7 %
NEUTROS PCT: 54 %
Neutro Abs: 4.4 10*3/uL (ref 1.7–7.7)
Platelets: 237 10*3/uL (ref 150–400)
RBC: 4.62 MIL/uL (ref 3.87–5.11)
RDW: 15.8 % — ABNORMAL HIGH (ref 11.5–15.5)
WBC: 8.1 10*3/uL (ref 4.0–10.5)

## 2017-04-08 LAB — POCT CBC
Granulocyte percent: 63.9 %G (ref 37–80)
HEMATOCRIT: 39.5 % (ref 37.7–47.9)
Hemoglobin: 12.6 g/dL (ref 12.2–16.2)
LYMPH, POC: 2 (ref 0.6–3.4)
MCH, POC: 26 pg — AB (ref 27–31.2)
MCHC: 32 g/dL (ref 31.8–35.4)
MCV: 81.4 fL (ref 80–97)
MID (cbc): 0.7 (ref 0–0.9)
MPV: 6.8 fL (ref 0–99.8)
POC GRANULOCYTE: 4.7 (ref 2–6.9)
POC LYMPH %: 27.1 % (ref 10–50)
POC MID %: 9 % (ref 0–12)
Platelet Count, POC: 278 10*3/uL (ref 142–424)
RBC: 4.85 M/uL (ref 4.04–5.48)
RDW, POC: 16 %
WBC: 7.4 10*3/uL (ref 4.6–10.2)

## 2017-04-08 LAB — LIPASE, BLOOD: LIPASE: 21 U/L (ref 11–51)

## 2017-04-08 LAB — URINALYSIS, ROUTINE W REFLEX MICROSCOPIC
BILIRUBIN URINE: NEGATIVE
GLUCOSE, UA: NEGATIVE mg/dL
Hgb urine dipstick: NEGATIVE
KETONES UR: NEGATIVE mg/dL
NITRITE: NEGATIVE
PH: 6 (ref 5.0–8.0)
Protein, ur: NEGATIVE mg/dL
Specific Gravity, Urine: 1.006 (ref 1.005–1.030)

## 2017-04-08 LAB — POCT URINALYSIS DIP (MANUAL ENTRY)
BILIRUBIN UA: NEGATIVE
BILIRUBIN UA: NEGATIVE mg/dL
Glucose, UA: NEGATIVE mg/dL
Nitrite, UA: NEGATIVE
Protein Ur, POC: NEGATIVE mg/dL
Spec Grav, UA: <=1.005 — AB (ref 1.010–1.025)
Urobilinogen, UA: 0.2 E.U./dL
pH, UA: 5 (ref 5.0–8.0)

## 2017-04-08 LAB — TSH: TSH: 0.775 u[IU]/mL (ref 0.350–4.500)

## 2017-04-08 LAB — ETHANOL

## 2017-04-08 MED ORDER — FLUTICASONE PROPIONATE 50 MCG/ACT NA SUSP
2.0000 | Freq: Every day | NASAL | Status: DC
  Administered 2017-04-08 – 2017-04-09 (×2): 2 via NASAL
  Filled 2017-04-08: qty 16

## 2017-04-08 MED ORDER — DULOXETINE HCL 30 MG PO CPEP
30.0000 mg | ORAL_CAPSULE | Freq: Every day | ORAL | Status: DC
  Administered 2017-04-09: 30 mg via ORAL
  Filled 2017-04-08: qty 1

## 2017-04-08 MED ORDER — ALPRAZOLAM 0.5 MG PO TABS
0.5000 mg | ORAL_TABLET | Freq: Every day | ORAL | Status: DC
  Administered 2017-04-08 – 2017-04-09 (×2): 0.5 mg via ORAL
  Filled 2017-04-08 (×2): qty 1

## 2017-04-08 MED ORDER — DIPHENOXYLATE-ATROPINE 2.5-0.025 MG PO TABS: 1.0000 | ORAL_TABLET | Freq: Four times a day (QID) | ORAL | Status: DC | PRN

## 2017-04-08 MED ORDER — GABAPENTIN 100 MG PO CAPS
200.0000 mg | ORAL_CAPSULE | Freq: Four times a day (QID) | ORAL | Status: DC
  Administered 2017-04-08 – 2017-04-10 (×7): 200 mg via ORAL
  Filled 2017-04-08 (×7): qty 2

## 2017-04-08 MED ORDER — TOPIRAMATE 25 MG PO TABS
50.0000 mg | ORAL_TABLET | Freq: Every day | ORAL | Status: DC
  Administered 2017-04-08 – 2017-04-09 (×2): 50 mg via ORAL
  Filled 2017-04-08 (×2): qty 2

## 2017-04-08 MED ORDER — LEVOTHYROXINE SODIUM 50 MCG PO TABS
50.0000 ug | ORAL_TABLET | Freq: Every day | ORAL | Status: DC
  Administered 2017-04-09 – 2017-04-10 (×2): 50 ug via ORAL
  Filled 2017-04-08 (×2): qty 1

## 2017-04-08 MED ORDER — DULOXETINE HCL 30 MG PO CPEP: 30.0000 mg | ORAL_CAPSULE | Freq: Every day | ORAL | Status: DC

## 2017-04-08 MED ORDER — ENOXAPARIN SODIUM 40 MG/0.4ML ~~LOC~~ SOLN
40.0000 mg | SUBCUTANEOUS | Status: DC
  Administered 2017-04-08 – 2017-04-09 (×2): 40 mg via SUBCUTANEOUS
  Filled 2017-04-08 (×2): qty 0.4

## 2017-04-08 MED ORDER — NITROFURANTOIN MONOHYD MACRO 100 MG PO CAPS
100.0000 mg | ORAL_CAPSULE | Freq: Two times a day (BID) | ORAL | Status: DC
  Administered 2017-04-09: 100 mg via ORAL
  Filled 2017-04-08: qty 1

## 2017-04-08 MED ORDER — DIAZEPAM 2 MG PO TABS
3.0000 mg | ORAL_TABLET | Freq: Every day | ORAL | Status: DC
  Administered 2017-04-08: 3 mg via ORAL
  Filled 2017-04-08: qty 2

## 2017-04-08 MED ORDER — QUETIAPINE FUMARATE 25 MG PO TABS
50.0000 mg | ORAL_TABLET | Freq: Every day | ORAL | Status: DC
  Administered 2017-04-08 – 2017-04-09 (×2): 50 mg via ORAL
  Filled 2017-04-08 (×2): qty 2

## 2017-04-08 MED ORDER — FUROSEMIDE 40 MG PO TABS
40.0000 mg | ORAL_TABLET | Freq: Every day | ORAL | Status: DC
  Administered 2017-04-09 – 2017-04-10 (×2): 40 mg via ORAL
  Filled 2017-04-08 (×2): qty 1

## 2017-04-08 MED ORDER — DIAZEPAM 2 MG PO TABS: 2.0000 mg | ORAL_TABLET | Freq: Every morning | ORAL | Status: DC

## 2017-04-08 MED ORDER — PANTOPRAZOLE SODIUM 40 MG PO TBEC
40.0000 mg | DELAYED_RELEASE_TABLET | Freq: Every day | ORAL | Status: DC
  Administered 2017-04-09 – 2017-04-10 (×2): 40 mg via ORAL
  Filled 2017-04-08 (×2): qty 1

## 2017-04-08 NOTE — Patient Instructions (Addendum)
GO TO [facility] ER. PLEASE TELL THEM THAT YOU ARE NOT THERE TO SEE AN ER PROVIDER.  YOU ARE BEING DIRECTLY ADMITTED TO THE HOSPITAL BY [facility]ENDING DR. NOAH WOUK.  IF YOU HAVE ANY PROBLEMS - PLEASE CALL MY CELL PHONE [phone].    IF you received an x-ray today, you will receive an invoice from [facility]. Please contact [facility] at [phone] with questions or concerns regarding your invoice.   IF you received labwork today, you will receive an invoice from [facility]. Please contact [facility] at [phone] with questions or concerns regarding your invoice.   Our billing staff will not be able to assist you with questions regarding bills from these companies.  You will be contacted with the lab results as soon as they are available. The fastest way to get your results is to activate your My Chart account. Instructions are located on the last page of this paperwork. If you have not heard from us regarding the results in 2 weeks, please contact this office.
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
## 2017-04-09 ENCOUNTER — Encounter (HOSPITAL_COMMUNITY): Payer: Self-pay

## 2017-04-09 DIAGNOSIS — F333 Major depressive disorder, recurrent, severe with psychotic symptoms: Secondary | ICD-10-CM

## 2017-04-09 DIAGNOSIS — F329 Major depressive disorder, single episode, unspecified: Secondary | ICD-10-CM | POA: Diagnosis not present

## 2017-04-09 DIAGNOSIS — I130 Hypertensive heart and chronic kidney disease with heart failure and stage 1 through stage 4 chronic kidney disease, or unspecified chronic kidney disease: Secondary | ICD-10-CM | POA: Diagnosis not present

## 2017-04-09 DIAGNOSIS — E039 Hypothyroidism, unspecified: Secondary | ICD-10-CM | POA: Diagnosis not present

## 2017-04-09 DIAGNOSIS — I10 Essential (primary) hypertension: Secondary | ICD-10-CM

## 2017-04-09 DIAGNOSIS — R569 Unspecified convulsions: Secondary | ICD-10-CM

## 2017-04-09 DIAGNOSIS — N390 Urinary tract infection, site not specified: Secondary | ICD-10-CM | POA: Diagnosis not present

## 2017-04-09 DIAGNOSIS — F339 Major depressive disorder, recurrent, unspecified: Secondary | ICD-10-CM | POA: Diagnosis not present

## 2017-04-09 DIAGNOSIS — G9340 Encephalopathy, unspecified: Secondary | ICD-10-CM | POA: Diagnosis not present

## 2017-04-09 DIAGNOSIS — K529 Noninfective gastroenteritis and colitis, unspecified: Secondary | ICD-10-CM | POA: Diagnosis not present

## 2017-04-09 DIAGNOSIS — E876 Hypokalemia: Secondary | ICD-10-CM | POA: Diagnosis not present

## 2017-04-09 DIAGNOSIS — N183 Chronic kidney disease, stage 3 (moderate): Secondary | ICD-10-CM | POA: Diagnosis not present

## 2017-04-09 DIAGNOSIS — K219 Gastro-esophageal reflux disease without esophagitis: Secondary | ICD-10-CM | POA: Diagnosis not present

## 2017-04-09 DIAGNOSIS — F419 Anxiety disorder, unspecified: Secondary | ICD-10-CM

## 2017-04-09 DIAGNOSIS — I5032 Chronic diastolic (congestive) heart failure: Secondary | ICD-10-CM | POA: Diagnosis not present

## 2017-04-09 LAB — BASIC METABOLIC PANEL
BUN / CREAT RATIO: 10 — AB (ref 12–28)
BUN: 15 mg/dL (ref 8–27)
CHLORIDE: 101 mmol/L (ref 96–106)
CO2: 25 mmol/L (ref 20–29)
Calcium: 10 mg/dL (ref 8.7–10.3)
Creatinine, Ser: 1.54 mg/dL — ABNORMAL HIGH (ref 0.57–1.00)
GFR calc non Af Amer: 36 mL/min/{1.73_m2} — ABNORMAL LOW (ref >59)
GFR, EST AFRICAN AMERICAN: 41 mL/min/{1.73_m2} — AB (ref >59)
Glucose: 99 mg/dL (ref 65–99)
POTASSIUM: 4 mmol/L (ref 3.5–5.2)
Sodium: 143 mmol/L (ref 134–144)

## 2017-04-09 LAB — OSMOLALITY: OSMOLALITY: 300 mosm/kg — AB (ref 275–295)

## 2017-04-09 LAB — VITAMIN B12: VITAMIN B 12: 730 pg/mL (ref 180–914)

## 2017-04-09 NOTE — Consult Note (Signed)
Jennifer Davidson Consult   Reason for Consult:  Delirium, depression Referring Physician:  Dr. Karleen Hampshire Patient Identification: Jennifer Davidson MRN:  [id number] Principal Diagnosis: <principal problem not specified> Diagnosis:   Patient Active Problem List   Diagnosis Date Noted  . 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 Seizure-like activity ([place]) [R56.9] 09/14/2016  . Moderate episode of recurrent major depressive disorder ([place]) [F33.1] 08/31/2016  . PTSD (post-traumatic stress disorder) [F43.10] 08/31/2016  . UTI (urinary tract infection) [N39.0] 06/25/2016  . Acute encephalopathy [G93.40] 06/25/2016  . Severe recurrent depression with psychosis ([place]) [F33.3] 06/25/2016  . Acute gastroenteritis [K52.9] 06/23/2016  . GERD (gastroesophageal reflux disease) [K21.9] 11/13/2015  . Syncope [R55] 08/30/2015  . Generalized anxiety disorder [F41.1] 06/14/2015  . Chronic pain of right lower extremity [M79.604, G89.29] 05/14/2015  . Neuropathy of lower extremity [G57.90] 05/14/2015  . Headache, migraine [G43.909] 01/17/2015  . Severe recurrent major depressive disorder with psychotic features ([place]) [F33.3]   . Major depression, recurrent, chronic ([place]) [F33.9] 12/02/2014  . Mood disorder ([place]) [F39] 12/01/2014  . Chronic back pain [M54.9, G89.29]   . Hyperparathyroidism, secondary renal ([place]) [N25.81] 07/31/2014  . Neuromuscular disorder ([place]) [G70.9] 07/31/2014  . Lower back pain [M54.5] 09/21/2013  . Diastolic heart failure ([place]) [I50.30] 03/17/2012  . Syncope and collapse [R55] 02/12/2012  . Hypertension [I10] 02/04/2012  . Chronic kidney disease [N18.9] 02/04/2012  . Edema [R60.9] 02/04/2012  . Tachycardia [R00.0] 02/04/2012  . Encounter for long-term (current) use of medications [Z79.899] 02/04/2012  . Anxiety and depression [F41.9, F32.9] 02/04/2012  . Hypothyroid [E03.9] 02/04/2012  . Hyperlipidemia [E78.5] 02/04/2012    Total Time spent with  patient: 30 minutes  Subjective:   Jennifer Davidson is a 61 y.o. female patient admitted with AMS in the setting of infection and falls.  HPI:  Jennifer Davidson is a 61 year old female on polypharmacy, with a psychiatric history of depression, PTSD admitted by PCP for AMS.  She has had episodes such as this in the past associated with infection.  Patient is oriented and sensorium clear in discussing with Probation officer.  She has some mild word finding difficulties and acknowledges periodic confusion. Has has experienced hypnopompic hallucinations and periodic fleeting VH for the past 2 weeks, describes seeing a "little boy" standing near her.  She denies any AH or CAH. Denies any SI or intention to harm herself or others. She reports that she has 4 grandchildren she loves dearly and a husband that she loves, and has plenty to live for.   She reports that she is depressed all the time, for more than 2 years. Has previously had psychiatric care but did not feel it was helpful.  She was last seen by Dr. Modesta Messing in May 2018 and diagnosed with MDD with psychotic features.  She has been diagnosed with associated non-epileptic seizures as well.  Patient has difficulty remembering if she has seen Dr. Modesta Messing in the past (only saw her for 2 visits) but is agreeable to seeing her once again on an outpatient basis.  She reports that she has trouble sleeping, trouble with energy. She has significant fatigue complicated by chronic pain "everwhere."  She has foggy thinking, trouble enjoying things in life (but does enjoy her grandchildren).  She denies any abuse or fear at home.  She is not paranoid of any particular individuals.    She very much wants to avoid the psychiatric hospital.  She reports this was very hard for  her and not helpful. She is agreeable to outpatient psychiatric care as recommended and medication management for sleep/mood.   Past Psychiatric History: [facility] admission 2016  Risk to Self: Is patient at risk for  suicide?: No Risk to Others:   Prior Inpatient Therapy:   Prior Outpatient Therapy:    Past Medical History:  Past Medical History:  Diagnosis Date  . Anxiety   . Cholesterol serum increased   . Chronic back pain   . Chronic kidney disease   . Depression   . Diastolic heart failure ([place])   . GERD (gastroesophageal reflux disease)   . Hyperparathyroidism ([place])   . Hypertension   . Hypothyroidism   . Migraine   . Neuromuscular disorder ([place])   . Sleep apnea   . Tachycardia   . Trochanteric bursitis of both hips 2012   Confirmed on MRI    Past Surgical History:  Procedure Laterality Date  . ABDOMINAL HYSTERECTOMY    . APPENDECTOMY    . BACK SURGERY    . KNEE SURGERY     Family History:  Family History  Problem Relation Age of Onset  . Diabetes Mother   . Heart disease Mother   . Kidney disease Mother   . Thyroid disease Mother   . Heart disease Father   . Seizures Father   . COPD Father   . Cancer Maternal Grandmother    Family Psychiatric  History: none  Social History:  Social History   Substance and Sexual Activity  Alcohol Use No   Comment: 08-31-2016 PER PT NO     Social History   Substance and Sexual Activity  Drug Use No   Comment: 08-31-2016 PER PT NO     Social History   Socioeconomic History  . Marital status: Married    Spouse name: None  . Number of children: None  . Years of education: None  . Highest education level: None  Social Needs  . Financial resource strain: None  . Food insecurity - worry: None  . Food insecurity - inability: None  . Transportation needs - medical: None  . Transportation needs - non-medical: None  Occupational History  . None  Tobacco Use  . Smoking status: Never Smoker  . Smokeless tobacco: Never Used  Substance and Sexual Activity  . Alcohol use: No    Comment: 08-31-2016 PER PT NO  . Drug use: No    Comment: 08-31-2016 PER PT NO   . Sexual activity: Yes    Partners: Male    Comment: married  Other  Topics Concern  . None  Social History Narrative  . None   Additional Social History:    Allergies:   Allergies  Allergen Reactions  . Tramadol Nausea And Vomiting  . Trazodone And Nefazodone Other (See Comments)    Per pt trazodone caused hallucinations and behavior changes     Labs:  Results for orders placed or performed during the hospital encounter of 04/08/17 (from the past 48 hour(s))  Blood gas, venous     Status: Abnormal   Collection Time: 04/08/17  9:15 PM  Result Value Ref Range   FIO2 21.00    Delivery systems ROOM AIR    pH, Ven 7.344 7.250 - 7.430   pCO2, Ven 54.3 44.0 - 60.0 mmHg   pO2, Ven 35.1 32.0 - 45.0 mmHg   Bicarbonate 28.8 (H) 20.0 - 28.0 mmol/L   Acid-Base Excess 2.6 (H) 0.0 - 2.0 mmol/L   O2 Saturation 62.7 %  Patient temperature 37.0    Collection site VEIN    Drawn by COLLECTED BY LABORATORY    Sample type VENOUS   Comprehensive metabolic panel     Status: Abnormal   Collection Time: 04/08/17  9:17 PM  Result Value Ref Range   Sodium 140 135 - 145 mmol/L   Potassium 3.2 (L) 3.5 - 5.1 mmol/L   Chloride 102 101 - 111 mmol/L   CO2 28 22 - 32 mmol/L   Glucose, Bld 105 (H) 65 - 99 mg/dL   BUN 16 6 - 20 mg/dL   Creatinine, Ser 1.54 (H) 0.44 - 1.00 mg/dL   Calcium 9.6 8.9 - 10.3 mg/dL   Total Protein 7.5 6.5 - 8.1 g/dL   Albumin 3.8 3.5 - 5.0 g/dL   AST 23 15 - 41 U/L   ALT 22 14 - 54 U/L   Alkaline Phosphatase 70 38 - 126 U/L   Total Bilirubin 0.6 0.3 - 1.2 mg/dL   GFR calc non Af Amer 35 (L) >60 mL/min   GFR calc Af Amer 40 (L) >60 mL/min    Comment: (NOTE) The eGFR has been calculated using the CKD EPI equation. This calculation has not been validated in all clinical situations. eGFR's persistently <60 mL/min signify possible Chronic Kidney Disease.    Anion gap 10 5 - 15  CBC WITH DIFFERENTIAL     Status: Abnormal   Collection Time: 04/08/17  9:17 PM  Result Value Ref Range   WBC 8.1 4.0 - 10.5 K/uL   RBC 4.62 3.87 - 5.11  MIL/uL   Hemoglobin 12.0 12.0 - 15.0 g/dL   HCT 38.3 36.0 - 46.0 %   MCV 82.9 78.0 - 100.0 fL   MCH 26.0 26.0 - 34.0 pg   MCHC 31.3 30.0 - 36.0 g/dL   RDW 15.8 (H) 11.5 - 15.5 %   Platelets 237 150 - 400 K/uL   Neutrophils Relative % 54 %   Neutro Abs 4.4 1.7 - 7.7 K/uL   Lymphocytes Relative 33 %   Lymphs Abs 2.7 0.7 - 4.0 K/uL   Monocytes Relative 7 %   Monocytes Absolute 0.6 0.1 - 1.0 K/uL   Eosinophils Relative 5 %   Eosinophils Absolute 0.4 0.0 - 0.7 K/uL   Basophils Relative 1 %   Basophils Absolute 0.1 0.0 - 0.1 K/uL  Ethanol     Status: None   Collection Time: 04/08/17  9:17 PM  Result Value Ref Range   Alcohol, Ethyl (B) <10 <10 mg/dL    Comment:        LOWEST DETECTABLE LIMIT FOR SERUM ALCOHOL IS 10 mg/dL FOR MEDICAL PURPOSES ONLY   Osmolality     Status: Abnormal   Collection Time: 04/08/17  9:17 PM  Result Value Ref Range   Osmolality 300 (H) 275 - 295 mOsm/kg    Comment: Performed at [facility], [address] [place], [place] [address]  TSH     Status: None   Collection Time: 04/08/17  9:17 PM  Result Value Ref Range   TSH 0.775 0.350 - 4.500 uIU/mL    Comment: Performed by a 3rd Generation assay with a functional sensitivity of <=0.01 uIU/mL.  Vitamin B12     Status: None   Collection Time: 04/08/17  9:17 PM  Result Value Ref Range   Vitamin B-12 730 180 - 914 pg/mL    Comment: (NOTE) This assay is not validated for testing neonatal or myeloproliferative syndrome specimens for  Vitamin B12 levels. Performed at [facility], [place] [address], [place], [place] [address]   Lipase, blood     Status: None   Collection Time: 04/08/17  9:17 PM  Result Value Ref Range   Lipase 21 11 - 51 U/L  Urinalysis, Routine w reflex microscopic     Status: Abnormal   Collection Time: 04/08/17 10:37 PM  Result Value Ref Range   Color, Urine YELLOW YELLOW   APPearance CLEAR CLEAR   Specific Gravity, Urine 1.006 1.005 - 1.030   pH 6.0 5.0 - 8.0   Glucose,  UA NEGATIVE NEGATIVE mg/dL   Hgb urine dipstick NEGATIVE NEGATIVE   Bilirubin Urine NEGATIVE NEGATIVE   Ketones, ur NEGATIVE NEGATIVE mg/dL   Protein, ur NEGATIVE NEGATIVE mg/dL   Nitrite NEGATIVE NEGATIVE   Leukocytes, UA MODERATE (A) NEGATIVE   RBC / HPF 0-5 0 - 5 RBC/hpf   WBC, UA 6-30 0 - 5 WBC/hpf   Bacteria, UA RARE (A) NONE SEEN   Squamous Epithelial / LPF 0-5 (A) NONE SEEN    Current Facility-Administered Medications  Medication Dose Route Frequency Provider Last Rate Last Dose  . ALPRAZolam Duanne Moron) tablet 0.5 mg  0.5 mg Oral QHS Gwynne Edinger, MD   0.5 mg at 04/08/17 2256  . diazepam (VALIUM) tablet 2 mg  2 mg Oral q morning - 10a Wouk, Ailene Rud, MD      . diazepam (VALIUM) tablet 3 mg  3 mg Oral QHS Wouk, Ailene Rud, MD   3 mg at 04/08/17 2257  . diphenoxylate-atropine (LOMOTIL) 2.5-0.025 MG per tablet 1 tablet  1 tablet Oral QID PRN Wouk, Ailene Rud, MD      . DULoxetine (CYMBALTA) DR capsule 30 mg  30 mg Oral QHS Wouk, Ailene Rud, MD      . enoxaparin (LOVENOX) injection 40 mg  40 mg Subcutaneous Q24H Gwynne Edinger, MD   40 mg at 04/08/17 2256  . fluticasone (FLONASE) 50 MCG/ACT nasal spray 2 spray  2 spray Each Nare QHS Wouk, Ailene Rud, MD   2 spray at 04/08/17 2257  . furosemide (LASIX) tablet 40 mg  40 mg Oral Daily Gwynne Edinger, MD   40 mg at 04/09/17 0909  . gabapentin (NEURONTIN) capsule 200 mg  200 mg Oral QID Gwynne Edinger, MD   200 mg at 04/09/17 8206  . levothyroxine (SYNTHROID, LEVOTHROID) tablet 50 mcg  50 mcg Oral QAC breakfast Gwynne Edinger, MD   50 mcg at 04/09/17 [phone]  . nitrofurantoin (macrocrystal-monohydrate) (MACROBID) capsule 100 mg  100 mg Oral Q12H Wouk, Ailene Rud, MD   100 mg at 04/09/17 0909  . pantoprazole (PROTONIX) EC tablet 40 mg  40 mg Oral Daily Gwynne Edinger, MD   40 mg at 04/09/17 1537  . QUEtiapine (SEROQUEL) tablet 50 mg  50 mg Oral QHS Gwynne Edinger, MD   50 mg at 04/08/17 2256  .  topiramate (TOPAMAX) tablet 50 mg  50 mg Oral QHS Wouk, Ailene Rud, MD   50 mg at 04/08/17 2256    Musculoskeletal: Strength & Muscle Tone: decreased Gait & Station: unable to stand Patient leans: N/A  Psychiatric Specialty Exam: Physical Exam  Review of Systems  Constitutional: Positive for malaise/fatigue.  HENT: Negative.   Respiratory: Negative.   Cardiovascular: Negative.   Gastrointestinal: Negative.   Genitourinary: Negative.   Musculoskeletal: Positive for back pain, falls, joint pain, myalgias and neck pain.  Neurological: Positive for dizziness, tingling,  sensory change and focal weakness.  Psychiatric/Behavioral: Positive for depression, hallucinations and memory loss. Negative for substance abuse and suicidal ideas. The patient is nervous/anxious and has insomnia.     Blood pressure 105/64, pulse 76, temperature 97.7 F (36.5 C), temperature source Oral, resp. rate 18, height 5' 3"  (1.6 m), weight 96.9 kg (213 lb 10 oz), SpO2 95 %.Body mass index is 37.84 kg/m.  General Appearance: Casual and Fairly Groomed  Eye Contact:  Fair  Speech:  Normal Rate  Volume:  Decreased  Mood:  Dysphoric  Affect:  Congruent and Depressed  Thought Process:  Goal Directed and Descriptions of Associations: Intact  Orientation:  Full (Time, Place, and Person)  Thought Content:  Logical and Hallucinations: Visual associated with AMS past 2 weeks  Suicidal Thoughts:  No  Homicidal Thoughts:  No  Memory:  Immediate;   Fair  Judgement:  Fair  Insight:  Shallow  Psychomotor Activity:  Normal  Concentration:  Attention Span: Fair  Recall:  [employer] of Knowledge:  Fair  Language:  Fair  Akathisia:  Negative  Handed:  Right  AIMS (if indicated):     Assets:  Communication Skills Desire for Improvement Financial Resources/Insurance Housing Intimacy Social Support  ADL's:  Intact  Cognition:  WNL  Sleep:   fair - 4-6 hours     Treatment Plan Summary: LINDLEY STACHNIK is a 61  year old female with chronic PTSD and persistent depressive disorder.  She is presently admitted with AMS in the setting of presumed UTI and polypharmacy.  She does not present as grossly psychotic or delusional on exam.  She has some mild word finding difficulties and acknowledges low level confusion.  She has vague VH but does not present gross disorganization or paranoia.  Her mixed delirium is likely attributable to medical encephalopathy complicated by polypharmacy, chronic PTSD, inadequate quality of sleep (2/2 to apnea), and recent UTI.  She has previously had negative experiences with psychiatric hospitalization and wishes to avoid this.  Frankly, her symptoms of depression appear to be chronic and I am concerned for long standing personality disorder as a contributing factor.  She would benefit from a higher level of care than outpatient psychiatric treatment, but inpatient care is not necessary at this point. I would recommend she return to her primary outpatient psychiatrist, Dr. Modesta Messing, for long term care.  More immediately, I will make a referral for her to participate in the IOP/PHP program at [facility] outpatient behavioral in [place].  This would include daily interactions with her mental health team and medication management, so as to reduce polypharmacy and reduce her medication burden.   - Please note patient is high risk for falls and reports frequent falls at home; I would suggest PT assessment if not already done - Recommend discontinuing valium, okay to maintain xanax 0.5 mg nightly at bedtime given short acting agent - Continue Cymbalta 30 mg daily and Seroquel 50-100 mg nightly - Ongoing medical care/clearance per primary medical team - Psychiatrically clear for discharge home with family  Disposition: No evidence of imminent risk to self or others at present.   Patient does not meet criteria for psychiatric inpatient admission.  Aundra Dubin, MD 04/09/2017 1:12 PM

## 2017-04-09 NOTE — Progress Notes (Signed)
PROGRESS NOTE    Jennifer Davidson  ZOX:[id number] DOB: 12/11/1955 DOA: 04/08/2017 PCP: Sherren MochaShaw, Eva N, MD    Brief Narrative:  Jennifer Davidson is a 61 y.o. female with medical history significant for ckd 3, severe major depressive disorder, chronic pain formerly on opioids, hypothyroid, questionable seizure disorder, polypharmacy, presenting directly admitted from pcp's office FOR HALLUCINATIONS.     Assessment & Plan:   Active Problems:   Hypertension   Chronic kidney disease   Anxiety and depression   Hypothyroid   Hyperparathyroidism, secondary renal (HCC)   Chronic back pain   Major depression, recurrent, chronic (HCC)   Severe recurrent major depressive disorder with psychotic features (HCC)   UTI (urinary tract infection)   Acute encephalopathy   Severe recurrent depression with psychosis (HCC)   Seizure-like activity (HCC)   Hallucinations/delirium on admission Probably secondary to polypharmacy, insomnia.  Versus interactions between antibiotic and her home medications. Psychiatry consulted, recommended outpatient follow-up at behavioral health in [place], with Dr. Vanetta ShawlHisada, for long-term, IOP/PHP program. Discontinue Valium as per psychiatry recommendations, resume Xanax Seroquel and Cymbalta.   Hypothyroidism TSH within normal limits, resume Synthroid.   Abnormal UA Patient denies any urinary symptoms.  Will discontinue Macrobid.  Hypertension Well-controlled.  Resume home dose of Lasix.   GERD on Protonix resume the same.   Chronic diarrhea Has been going on for many years.  Probably has a history of IBS.  Recommend outpatient follow-up with a gastroenterologist.   Stage II CKD Creatinine appears to be at baseline.  Hypokalemia Replaced, recheck in the morning   DVT prophylaxis: Lovenox Code Status: Full code Family Communication: Husband at bedside Disposition Plan: Possible discharge home in a.m. if no hallucinations   Consultants:    Psychiatric  Procedures: None     Antimicrobials: None   Subjective: No hallucinations today  Objective: Vitals:   04/08/17 1931 04/09/17 0643 04/09/17 1434  BP: 135/77 105/64 118/66  Pulse: 90 76 77  Resp: 18 18 18   Temp: 97.7 F (36.5 C) 97.7 F (36.5 C) 97.8 F (36.6 C)  TempSrc: Oral Oral Oral  SpO2: 92% 95% 95%  Weight: 97.7 kg (215 lb 6.2 oz) 96.9 kg (213 lb 10 oz)   Height: 5\' 3"  (1.6 m)      Intake/Output Summary (Last 24 hours) at 04/09/2017 1438 Last data filed at 04/09/2017 1435 Gross per 24 hour  Intake 360 ml  Output 300 ml  Net 60 ml   Filed Weights   04/08/17 1931 04/09/17 0643  Weight: 97.7 kg (215 lb 6.2 oz) 96.9 kg (213 lb 10 oz)    Examination:  General exam: Appears calm and comfortable  Respiratory system: Clear to auscultation. Respiratory effort normal. Cardiovascular system: S1 & S2 heard, RRR. No JVD, murmurs, rubs, gallops or clicks. No pedal edema. Gastrointestinal system: Abdomen is nondistended, soft and nontender. No organomegaly or masses felt. Normal bowel sounds heard. Central nervous system: Alert and oriented. No focal neurological deficits. Extremities: Symmetric 5 x 5 power. Skin: No rashes, lesions or ulcers Psychiatry: Judgement and insight appear normal. Mood & affect appropriate.     Data Reviewed: I have personally reviewed following labs and imaging studies  CBC: Recent Labs  Lab 04/08/17 1746 04/08/17 2117  WBC 7.4 8.1  NEUTROABS  --  4.4  HGB 12.6 12.0  HCT 39.5 38.3  MCV 81.4 82.9  PLT  --  237   Basic Metabolic Panel: Recent Labs  Lab 04/08/17 1658 04/08/17 2117  NA  143 140  K 4.0 3.2*  CL 101 102  CO2 25 28  GLUCOSE 99 105*  BUN 15 16  CREATININE 1.54* 1.54*  CALCIUM 10.0 9.6   GFR: Estimated Creatinine Clearance: 41.4 mL/min (A) (by C-G formula based on SCr of 1.54 mg/dL (H)). Liver Function Tests: Recent Labs  Lab 04/08/17 2117  AST 23  ALT 22  ALKPHOS 70  BILITOT 0.6   PROT 7.5  ALBUMIN 3.8   Recent Labs  Lab 04/08/17 2117  LIPASE 21   No results for input(s): AMMONIA in the last 168 hours. Coagulation Profile: No results for input(s): INR, PROTIME in the last 168 hours. Cardiac Enzymes: No results for input(s): CKTOTAL, CKMB, CKMBINDEX, TROPONINI in the last 168 hours. BNP (last 3 results) No results for input(s): PROBNP in the last 8760 hours. HbA1C: No results for input(s): HGBA1C in the last 72 hours. CBG: No results for input(s): GLUCAP in the last 168 hours. Lipid Profile: No results for input(s): CHOL, HDL, LDLCALC, TRIG, CHOLHDL, LDLDIRECT in the last 72 hours. Thyroid Function Tests: Recent Labs    04/08/17 2117  TSH 0.775   Anemia Panel: Recent Labs    04/08/17 2117  VITAMINB12 730   Sepsis Labs: No results for input(s): PROCALCITON, LATICACIDVEN in the last 168 hours.  No results found for this or any previous visit (from the past 240 hour(s)).       Radiology Studies: No results found.      Scheduled Meds: . ALPRAZolam  0.5 mg Oral QHS  . DULoxetine  30 mg Oral QHS  . enoxaparin (LOVENOX) injection  40 mg Subcutaneous Q24H  . fluticasone  2 spray Each Nare QHS  . furosemide  40 mg Oral Daily  . gabapentin  200 mg Oral QID  . levothyroxine  50 mcg Oral QAC breakfast  . pantoprazole  40 mg Oral Daily  . QUEtiapine  50 mg Oral QHS  . topiramate  50 mg Oral QHS   Continuous Infusions:   LOS: 0 days    Time spent: 35 minutes    Kathlen ModyVijaya Rajean Desantiago, MD [facility] Pager [phone]  If 7PM-7AM, please contact night-coverage [email or web address] Password [email or web address] 04/09/2017, 2:38 PM

## 2017-04-09 NOTE — Care Management Obs Status (Signed)
MEDICARE OBSERVATION STATUS NOTIFICATION   Patient Details  Name: Jennifer Davidson MRN: [id number] Date of Birth: 12/24/1955   Medicare Observation Status Notification Given:  Yes    Elliot CousinShavis, Kyley Laurel Ellen, RN 04/09/2017, 5:49 PM

## 2017-04-10 DIAGNOSIS — K529 Noninfective gastroenteritis and colitis, unspecified: Secondary | ICD-10-CM | POA: Diagnosis not present

## 2017-04-10 DIAGNOSIS — I5032 Chronic diastolic (congestive) heart failure: Secondary | ICD-10-CM | POA: Diagnosis not present

## 2017-04-10 DIAGNOSIS — E876 Hypokalemia: Secondary | ICD-10-CM | POA: Diagnosis not present

## 2017-04-10 DIAGNOSIS — F339 Major depressive disorder, recurrent, unspecified: Secondary | ICD-10-CM | POA: Diagnosis not present

## 2017-04-10 DIAGNOSIS — K219 Gastro-esophageal reflux disease without esophagitis: Secondary | ICD-10-CM | POA: Diagnosis not present

## 2017-04-10 DIAGNOSIS — N183 Chronic kidney disease, stage 3 (moderate): Secondary | ICD-10-CM | POA: Diagnosis not present

## 2017-04-10 DIAGNOSIS — N390 Urinary tract infection, site not specified: Secondary | ICD-10-CM | POA: Diagnosis not present

## 2017-04-10 DIAGNOSIS — I130 Hypertensive heart and chronic kidney disease with heart failure and stage 1 through stage 4 chronic kidney disease, or unspecified chronic kidney disease: Secondary | ICD-10-CM | POA: Diagnosis not present

## 2017-04-10 DIAGNOSIS — G9340 Encephalopathy, unspecified: Secondary | ICD-10-CM | POA: Diagnosis not present

## 2017-04-10 DIAGNOSIS — E039 Hypothyroidism, unspecified: Secondary | ICD-10-CM | POA: Diagnosis not present

## 2017-04-10 DIAGNOSIS — I10 Essential (primary) hypertension: Secondary | ICD-10-CM | POA: Diagnosis not present

## 2017-04-10 LAB — HIV ANTIBODY (ROUTINE TESTING W REFLEX): HIV SCREEN 4TH GENERATION: NONREACTIVE

## 2017-04-10 MED ORDER — DULOXETINE HCL 30 MG PO CPEP: 30.0000 mg | ORAL_CAPSULE | Freq: Every day | ORAL | 0 refills | Status: DC

## 2017-04-10 MED ORDER — TOPIRAMATE 50 MG PO TABS: 50.0000 mg | ORAL_TABLET | Freq: Every day | ORAL | 0 refills | Status: DC

## 2017-04-10 MED ORDER — QUETIAPINE FUMARATE 50 MG PO TABS: 50.0000 mg | ORAL_TABLET | Freq: Every day | ORAL | Status: DC

## 2017-04-10 MED ORDER — ALPRAZOLAM 0.5 MG PO TABS: 0.5000 mg | ORAL_TABLET | Freq: Every day | ORAL | 0 refills | Status: DC

## 2017-04-10 NOTE — Evaluation (Signed)
Physical Therapy Evaluation Patient Details Name: Jennifer Davidson MRN: [id number] DOB: 09/23/1955 Today's Date: 04/10/2017   History of Present Illness  61 y.o. female with medical history significant for ckd 3, severe major depressive disorder, chronic pain formerly on opioids, hypothyroid, questionable seizure disorder, polypharmacy, presenting directly admitted from pcp's office FOR HALLUCINATIONS  Clinical Impression  Pt is at baseline of walking independently with a quad cane. She ambulated 300' with quad cane without loss of balance. She is ready to DC home from PT standpoint, no follow up PT indicated. PT signing off.      Follow Up Recommendations No PT follow up    Equipment Recommendations  None recommended by PT    Recommendations for Other Services       Precautions / Restrictions Precautions Precautions: Fall Precaution Comments: reports h/o multiple falls Restrictions Weight Bearing Restrictions: No      Mobility  Bed Mobility Overal bed mobility: Modified Independent                Transfers Overall transfer level: Modified independent Equipment used: Quad cane                Ambulation/Gait Ambulation/Gait assistance: Modified independent (Device/Increase time) Ambulation Distance (Feet): 300 Feet Assistive device: Quad cane Gait Pattern/deviations: Step-through pattern;Decreased stride length Gait velocity: decr Gait velocity interpretation: Below normal speed for age/gender General Gait Details: slow but steady with SBQC  Stairs            Wheelchair Mobility    Modified Rankin (Stroke Patients Only)       Balance Overall balance assessment: History of Falls(pt reports falls are related to chronic back pain)                                           Pertinent Vitals/Pain Pain Assessment: 0-10 Pain Score: 9  Pain Location: R sacroiliac joint -chronic pain Pain Descriptors / Indicators: Aching Pain  Intervention(s): Limited activity within patient's tolerance;Monitored during session;Premedicated before session    Home Living Family/patient expects to be discharged to:: Private residence Living Arrangements: Spouse/significant other Available Help at Discharge: Family;Available PRN/intermittently Type of Home: House Home Access: Stairs to enter Entrance Stairs-Rails: None Entrance Stairs-Number of Steps: 1 Home Layout: One level Home Equipment: Walker - 2 wheels;Shower seat;Cane - single point;Cane - quad;Grab bars - tub/shower;Grab bars - toilet      Prior Function Level of Independence: Independent with assistive device(s)         Comments: walks with SBQC; independent with ADLs     Hand Dominance   Dominant Hand: Right    Extremity/Trunk Assessment   Upper Extremity Assessment Upper Extremity Assessment: Overall WFL for tasks assessed    Lower Extremity Assessment Lower Extremity Assessment: Overall WFL for tasks assessed    Cervical / Trunk Assessment Cervical / Trunk Assessment: Normal  Communication   Communication: No difficulties  Cognition Arousal/Alertness: Awake/alert Behavior During Therapy: WFL for tasks assessed/performed Overall Cognitive Status: Within Functional Limits for tasks assessed                                        General Comments      Exercises     Assessment/Plan    PT Assessment Patent does not need any further PT  services  PT Problem List         PT Treatment Interventions      PT Goals (Current goals can be found in the Care Plan section)  Acute Rehab PT Goals PT Goal Formulation: All assessment and education complete, DC therapy    Frequency     Barriers to discharge        Co-evaluation               AM-PAC PT "6 Clicks" Daily Activity  Outcome Measure Difficulty turning over in bed (including adjusting bedclothes, sheets and blankets)?: None Difficulty moving from lying on back  to sitting on the side of the bed? : None Difficulty sitting down on and standing up from a chair with arms (e.g., wheelchair, bedside commode, etc,.)?: None Help needed moving to and from a bed to chair (including a wheelchair)?: None Help needed walking in hospital room?: None Help needed climbing 3-5 steps with a railing? : A Little 6 Click Score: 23    End of Session Equipment Utilized During Treatment: Gait belt Activity Tolerance: Patient tolerated treatment well Patient left: in chair;with call bell/phone within reach;with family/visitor present Nurse Communication: Mobility status      Time: [id number] PT Time Calculation (min) (ACUTE ONLY): 15 min   Charges:   PT Evaluation $PT Eval Low Complexity: 1 Low     PT G Codes:   PT G-Codes **NOT FOR INPATIENT CLASS** Functional Assessment Tool Used: AM-PAC 6 Clicks Basic Mobility Functional Limitation: Mobility: Walking and moving around Mobility: Walking and Moving Around Current Status ([id number]): At least 1 percent but less than 20 percent impaired, limited or restricted Mobility: Walking and Moving Around Goal Status [phone]): At least 1 percent but less than 20 percent impaired, limited or restricted    Tamala SerUhlenberg, Edoardo Laforte Kistler 04/10/2017, 1:30 PM [phone]

## 2017-04-10 NOTE — Progress Notes (Signed)
D/c to home w/ husband via w/c all d/c instructions given w/ verbal understanding.

## 2017-04-11 LAB — URINE CULTURE
Culture: >=100000 — AB
SPECIAL REQUESTS: NORMAL

## 2017-04-11 NOTE — Discharge Summary (Addendum)
Physician Discharge Summary  Jennifer Davidson WUJ:[id number] DOB: 03/26/1956 DOA: 04/08/2017  PCP: Jennifer Davidson, Jennifer Davidson  Admit date: 04/08/2017 Discharge date: 04/10/2017  Admitted From: HOME.  Disposition: hOME.   Recommendations for Outpatient Follow-up:  1. Follow up with PCP in 1-2 weeks 2. Please obtain BMP/CBC in one week Please follow up with psychiatry as recommended.  Please follow up with gastroenterology for chronic diarrhea, to evaluate for IBS.    Discharge Condition:stable.  CODE STATUS:full code.  Diet recommendation: Heart Healthy   Brief/Interim Summary: Jennifer G Kingis a 61 y.o.femalewith medical history significantfor ckd 3, severe major depressive disorder, chronic pain formerly on opioids, hypothyroid, questionable seizure disorder, polypharmacy, presenting directly admitted from pcp's office FOR HALLUCINATIONS    Discharge Diagnoses:  Active Problems:   Hypertension   Chronic kidney disease   Anxiety and depression   Hypothyroid   Hyperparathyroidism, secondary renal (HCC)   Chronic back pain   Major depression, recurrent, chronic (HCC)   Severe recurrent major depressive disorder with psychotic features (HCC)   UTI (urinary tract infection)   Acute encephalopathy   Seizure-like activity (HCC)  Hallucinations/delirium on admission Probably secondary to polypharmacy, insomnia.  Versus interactions between antibiotic and her home medications. Psychiatry consulted, recommended outpatient follow-[place] at behavioral health in [place] with Dr. Vanetta ShawlHisada, for long-term, IOP/PHP program. Discontinue Valium as per psychiatry recommendations, resume Xanax Seroquel and Cymbalta.   Hypothyroidism TSH within normal limits, resume Synthroid.   Abnormal UA Patient denies any urinary symptoms.  Will discontinue Macrobid.  Hypertension Well-controlled.  Resume home dose of Lasix.   GERD on Protonix resume the same.   Chronic diarrhea Has been going on  for many years.  Probably has a history of IBS.  Recommend outpatient follow-up with a gastroenterologist.   Severe Major depression:  No suicidal ideation. Follow up with Psychiatry as outpatient.    Stage II CKD Creatinine appears to be at baseline.  Hypokalemia Replaced.     Discharge Instructions  Discharge Instructions    Diet - low sodium heart healthy   Complete by:  As directed    Discharge instructions   Complete by:  As directed    Please follow up with psychiatry as recommended.     Allergies as of 04/10/2017      Reactions   Tramadol Nausea And Vomiting   Trazodone And Nefazodone Other (See Comments)   Per pt trazodone caused hallucinations and behavior changes      Medication List    STOP taking these medications   diazepam 2 MG tablet Commonly known as:  VALIUM   diazepam 5 MG/ML solution Commonly known as:  DIAZEPAM INTENSOL   lidocaine 5 % Commonly known as:  LIDODERM   ondansetron 4 MG tablet Commonly known as:  ZOFRAN   PROBIOTIC ACIDOPHILUS BEADS PO   rizatriptan 10 MG tablet Commonly known as:  MAXALT   tiZANidine 2 MG tablet Commonly known as:  ZANAFLEX     TAKE these medications   ALPRAZolam 0.5 MG tablet Commonly known as:  XANAX Take 1 tablet (0.5 mg total) by mouth at bedtime. What changed:    medication strength  how much to take  when to take this  reasons to take this   azelastine 0.1 % nasal spray Commonly known as:  ASTELIN Place 1 spray into both nostrils 2 (two) times daily. Use in each nostril as directed   CALCIUM 1200+D3 PO Take 1 tablet by mouth daily. Contains 800iu Vitamin D3  cholecalciferol 1000 units tablet Commonly known as:  VITAMIN D Take 1 tablet (1,000 Units total) by mouth daily at 12 noon.   diphenoxylate-atropine 2.5-0.025 MG tablet Commonly known as:  LOMOTIL TAKE 1 TABLET BY MOUTH 4 TIMES A DAY AS NEEDED FOR DIARRHEA   DULoxetine 30 MG capsule Commonly known as:  CYMBALTA Take  1 capsule (30 mg total) by mouth at bedtime. What changed:    medication strength  how much to take  when to take this   esomeprazole 40 MG capsule Commonly known as:  NEXIUM TAKE 1 CAPSULE (40 MG TOTAL) BY MOUTH DAILY.   fluticasone 50 MCG/ACT nasal spray Commonly known as:  FLONASE Place 2 sprays at bedtime into both nostrils.   furosemide 40 MG tablet Commonly known as:  LASIX Take 1 tablet (40 mg total) by mouth daily.   gabapentin 400 MG capsule Commonly known as:  NEURONTIN Take 1 capsule (400 mg total) 4 (four) times daily by mouth.   Guaifenesin 1200 MG Tb12 Commonly known as:  MUCINEX MAXIMUM STRENGTH Take 1 tablet (1,200 mg total) by mouth every 12 (twelve) hours as needed. What changed:  reasons to take this   hydrOXYzine 50 MG tablet Commonly known as:  ATARAX/VISTARIL Take 2 tablets (100 mg total) by mouth at bedtime. May repeat in 6 hours if needed.   levothyroxine 50 MCG tablet Commonly known as:  SYNTHROID, LEVOTHROID Take 1 tablet (50 mcg total) by mouth daily.   Potassium Chloride ER 20 MEQ Tbcr Take 1 tablet by mouth daily. With lasix   QUEtiapine 50 MG tablet Commonly known as:  SEROQUEL Take 1 tablet (50 mg total) by mouth at bedtime. What changed:  how much to take   sucralfate 1 g tablet Commonly known as:  CARAFATE TAKE 1 TABLET FOUR TIMES DAILY WITH MEALS AND AT BEDTIME for heartburn, indigestion, and abdominal pain   topiramate 50 MG tablet Commonly known as:  TOPAMAX Take 1 tablet (50 mg total) by mouth at bedtime. What changed:    medication strength  how much to take  how to take this  when to take this  additional instructions   triamcinolone cream 0.1 % Commonly known as:  KENALOG Apply 1 application topically 4 (four) times daily. For itching rash      Follow-up Information    Neysa Hotter, Davidson. Schedule an appointment as soon as possible for a visit in 1 week(s).   Specialty:  Psychiatry Contact  information: [address] [place] [address] [phone]        [facility]. Schedule an appointment as soon as possible for a visit in 1 day(s).   Why:   a referral for her to participate in the IOP/PHP program at [facility] outpatient behavioral in [place]. Contact information: [address] [place] [address]         Allergies  Allergen Reactions  . Tramadol Nausea And Vomiting  . Trazodone And Nefazodone Other (See Comments)    Per pt trazodone caused hallucinations and behavior changes     Consultations:  Psychiatry.    Procedures/Studies: Ct Biopsy  Result Date: March 31, 2017 CLINICAL DATA:  Right sacroiliac pain. Pain in the right low back and posterior right thigh as well. EXAM: CT GUIDED RIGHT SI JOINT INJECTION PROCEDURE: After a thorough discussion of risks and benefits of the procedure, including bleeding, infection, injury to nerves, blood vessels, and adjacent structures, verbal and written consent was obtained. Specific risks of the procedure  included nondiagnostic/nontherapeutic injection and non target injection. The patient was placed prone on the CT table and localization was performed over the sacrum. Target site marked using CT guidance. The skin was prepped and draped in the usual sterile fashion using Betadine soap. After local anesthesia with 1% lidocaine without epinephrine and subsequent deep anesthesia, a 3.5 inch 22 gauge spinal needle was advanced into the right SI joint under intermittent CT guidance. Once the needle was in satisfactory position, representative image was captured with the needle demonstrated in the sacroiliac joint. Injection of 0.5 mL of Isovue M 200 demonstrated intra-articular contrast spread. Subsequently, 2 mL of 0.5% bupivacaine were injected into the right SI joint. The needle was removed and a sterile dressing applied. No complications were observed. The patient reported significantly  decreased pain immediately following the procedure. IMPRESSION: Successful CT-guided right SI joint injection of anesthetic. Electronically Signed   By: Sebastian AcheAllen  Grady M.D.   On: 03/24/2017 09:26       Subjective: No urinary symptoms, no nausea, or vomiting. No abd pain.   Discharge Exam: Vitals:   04/09/17 2041 04/10/17 0446  BP: 130/70 (!) 105/55  Pulse: 86 78  Resp: 20 18  Temp: 98.4 F (36.9 C) 97.9 F (36.6 C)  SpO2: 92% 93%   Vitals:   04/09/17 0643 04/09/17 1434 04/09/17 2041 04/10/17 0446  BP: 105/64 118/66 130/70 (!) 105/55  Pulse: 76 77 86 78  Resp: 18 18 20 18   Temp: 97.7 F (36.5 C) 97.8 F (36.6 C) 98.4 F (36.9 C) 97.9 F (36.6 C)  TempSrc: Oral Oral Oral Oral  SpO2: 95% 95% 92% 93%  Weight: 96.9 kg (213 lb 10 oz)     Height:        General: Pt is alert, awake, not in acute distress Cardiovascular: RRR, S1/S2 +, no rubs, no gallops Respiratory: CTA bilaterally, no wheezing, no rhonchi Abdominal: Soft, NT, ND, bowel sounds + Extremities: no edema, no cyanosis    The results of significant diagnostics from this hospitalization (including imaging, microbiology, ancillary and laboratory) are listed below for reference.     Microbiology: Recent Results (from the past 240 hour(s))  Urine Culture     Status: Abnormal   Collection Time: 04/08/17 10:30 PM  Result Value Ref Range Status   Specimen Description URINE, RANDOM  Final   Special Requests Normal  Final   Culture >=100,000 COLONIES/mL ENTEROCOCCUS FAECALIS (A)  Final   Report Status 04/11/2017 FINAL  Final   Organism ID, Bacteria ENTEROCOCCUS FAECALIS (A)  Final      Susceptibility   Enterococcus faecalis - MIC*    AMPICILLIN <=2 SENSITIVE Sensitive     LEVOFLOXACIN 1 SENSITIVE Sensitive     NITROFURANTOIN <=16 SENSITIVE Sensitive     VANCOMYCIN 2 SENSITIVE Sensitive     * >=100,000 COLONIES/mL ENTEROCOCCUS FAECALIS     Labs: BNP (last 3 results) No results for input(s): BNP in the last  8760 hours. Basic Metabolic Panel: Recent Labs  Lab 04/08/17 1658 04/08/17 2117  NA 143 140  K 4.0 3.2*  CL 101 102  CO2 25 28  GLUCOSE 99 105*  BUN 15 16  CREATININE 1.54* 1.54*  CALCIUM 10.0 9.6   Liver Function Tests: Recent Labs  Lab 04/08/17 2117  AST 23  ALT 22  ALKPHOS 70  BILITOT 0.6  PROT 7.5  ALBUMIN 3.8   Recent Labs  Lab 04/08/17 2117  LIPASE 21   No results for input(s): AMMONIA in the  last 168 hours. CBC: Recent Labs  Lab 04/08/17 1746 04/08/17 2117  WBC 7.4 8.1  NEUTROABS  --  4.4  HGB 12.6 12.0  HCT 39.5 38.3  MCV 81.4 82.9  PLT  --  237   Cardiac Enzymes: No results for input(s): CKTOTAL, CKMB, CKMBINDEX, TROPONINI in the last 168 hours. BNP: Invalid input(s): POCBNP CBG: No results for input(s): GLUCAP in the last 168 hours. D-Dimer No results for input(s): DDIMER in the last 72 hours. Hgb A1c No results for input(s): HGBA1C in the last 72 hours. Lipid Profile No results for input(s): CHOL, HDL, LDLCALC, TRIG, CHOLHDL, LDLDIRECT in the last 72 hours. Thyroid function studies Recent Labs    04/08/17 2117  TSH 0.775   Anemia work up Recent Labs    04/08/17 2117  VITAMINB12 730   Urinalysis    Component Value Date/Time   COLORURINE YELLOW 04/08/2017 2237   APPEARANCEUR CLEAR 04/08/2017 2237   LABSPEC 1.006 04/08/2017 2237   PHURINE 6.0 04/08/2017 2237   GLUCOSEU NEGATIVE 04/08/2017 2237   HGBUR NEGATIVE 04/08/2017 2237   BILIRUBINUR NEGATIVE 04/08/2017 2237   BILIRUBINUR negative 04/08/2017 1709   BILIRUBINUR neg 12/15/2014 1059   KETONESUR NEGATIVE 04/08/2017 2237   PROTEINUR NEGATIVE 04/08/2017 2237   UROBILINOGEN 0.2 04/08/2017 1709   UROBILINOGEN 1.0 12/03/2014 0648   NITRITE NEGATIVE 04/08/2017 2237   LEUKOCYTESUR MODERATE (A) 04/08/2017 2237   Sepsis Labs Invalid input(s): PROCALCITONIN,  WBC,  LACTICIDVEN Microbiology Recent Results (from the past 240 hour(s))  Urine Culture     Status: Abnormal    Collection Time: 04/08/17 10:30 PM  Result Value Ref Range Status   Specimen Description URINE, RANDOM  Final   Special Requests Normal  Final   Culture >=100,000 COLONIES/mL ENTEROCOCCUS FAECALIS (A)  Final   Report Status 04/11/2017 FINAL  Final   Organism ID, Bacteria ENTEROCOCCUS FAECALIS (A)  Final      Susceptibility   Enterococcus faecalis - MIC*    AMPICILLIN <=2 SENSITIVE Sensitive     LEVOFLOXACIN 1 SENSITIVE Sensitive     NITROFURANTOIN <=16 SENSITIVE Sensitive     VANCOMYCIN 2 SENSITIVE Sensitive     * >=100,000 COLONIES/mL ENTEROCOCCUS FAECALIS     Time coordinating discharge: Over 30 minutes  SIGNED:   Kathlen Mody, Davidson  [facility] 04/11/2017, 9:22 AM Pager   If 7PM-7AM, please contact night-coverage [email or web address] Password [email or web address]

## 2017-04-12 DIAGNOSIS — L508 Other urticaria: Secondary | ICD-10-CM | POA: Diagnosis not present
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
## 2017-04-13 ENCOUNTER — Ambulatory Visit (HOSPITAL_COMMUNITY): Payer: Medicare HMO | Admitting: Psychiatry

## 2017-04-14 LAB — URINE CULTURE

## 2017-04-16 ENCOUNTER — Other Ambulatory Visit: Payer: Self-pay | Admitting: Family Medicine

## 2017-04-16 MED ORDER — AMPICILLIN 500 MG PO CAPS: 500.0000 mg | ORAL_CAPSULE | Freq: Four times a day (QID) | ORAL | 0 refills | Status: DC

## 2017-04-18 ENCOUNTER — Encounter: Payer: Self-pay | Admitting: Family Medicine

## 2017-04-18 ENCOUNTER — Other Ambulatory Visit: Payer: Self-pay

## 2017-04-18 ENCOUNTER — Ambulatory Visit (INDEPENDENT_AMBULATORY_CARE_PROVIDER_SITE_OTHER): Payer: Medicare HMO | Admitting: Family Medicine

## 2017-04-18 ENCOUNTER — Ambulatory Visit (INDEPENDENT_AMBULATORY_CARE_PROVIDER_SITE_OTHER): Payer: Medicare HMO

## 2017-04-18 VITALS — BP 108/70 | HR 62 | Temp 97.7°F | Ht 63.0 in | Wt 213.1 lb

## 2017-04-18 VITALS — BP 108/70 | HR 62 | Temp 97.7°F | Resp 16 | Ht 63.0 in | Wt 213.2 lb

## 2017-04-18 DIAGNOSIS — N183 Chronic kidney disease, stage 3 unspecified: Secondary | ICD-10-CM

## 2017-04-18 DIAGNOSIS — Z0000 Encounter for general adult medical examination without abnormal findings: Secondary | ICD-10-CM | POA: Diagnosis not present

## 2017-04-18 DIAGNOSIS — F333 Major depressive disorder, recurrent, severe with psychotic symptoms: Secondary | ICD-10-CM | POA: Diagnosis not present

## 2017-04-18 DIAGNOSIS — Z23 Encounter for immunization: Secondary | ICD-10-CM | POA: Diagnosis not present

## 2017-04-18 DIAGNOSIS — N3000 Acute cystitis without hematuria: Secondary | ICD-10-CM | POA: Diagnosis not present

## 2017-04-18 DIAGNOSIS — E876 Hypokalemia: Secondary | ICD-10-CM | POA: Diagnosis not present

## 2017-04-18 DIAGNOSIS — K580 Irritable bowel syndrome with diarrhea: Secondary | ICD-10-CM | POA: Diagnosis not present

## 2017-04-18 LAB — POC MICROSCOPIC URINALYSIS (UMFC): Mucus: ABSENT

## 2017-04-18 LAB — POCT URINALYSIS DIP (MANUAL ENTRY)
BILIRUBIN UA: NEGATIVE
GLUCOSE UA: NEGATIVE mg/dL
Ketones, POC UA: NEGATIVE mg/dL
Leukocytes, UA: NEGATIVE
Nitrite, UA: NEGATIVE
Protein Ur, POC: NEGATIVE mg/dL
RBC UA: NEGATIVE
SPEC GRAV UA: 1.015 (ref 1.010–1.025)
UROBILINOGEN UA: 0.2 U/dL
pH, UA: 6 (ref 5.0–8.0)

## 2017-04-18 MED ORDER — CIMETIDINE 400 MG PO TABS: 400.0000 mg | ORAL_TABLET | Freq: Three times a day (TID) | ORAL | 1 refills | Status: DC

## 2017-04-18 NOTE — Patient Instructions (Signed)
     IF you received an x-ray today, you will receive an invoice from [facility]. Please contact [facility] at [phone] with questions or concerns regarding your invoice.   IF you received labwork today, you will receive an invoice from [facility]. Please contact [facility] at [phone] with questions or concerns regarding your invoice.   Our billing staff will not be able to assist you with questions regarding bills from these companies.  You will be contacted with the lab results as soon as they are available. The fastest way to get your results is to activate your My Chart account. Instructions are located on the last page of this paperwork. If you have not heard from us regarding the results in 2 weeks, please contact this office.     

## 2017-04-18 NOTE — Progress Notes (Signed)
Subjective:   Jennifer Davidson is a 61 y.o. female who presents for an Initial Medicare Annual Wellness Visit.  Review of Systems    N/A  Cardiac Risk Factors include: hypertension;dyslipidemia;obesity (BMI >30kg/m2)     Objective:    Today's Vitals   04/18/17 0911 04/18/17 0920  BP: 108/70   Pulse: 62   Temp: 97.7 F (36.5 C)   TempSrc: Oral   SpO2: 94%   Weight: 213 lb 2 oz (96.7 kg)   Height: 5\' 3"  (1.6 m)   PainSc:  9    Body mass index is 37.75 kg/m.  Advanced Directives 04/18/2017 04/08/2017 06/29/2016 06/25/2016 08/30/2015 08/30/2015 06/13/2015  Does Patient Have a Medical Advance Directive? No Yes No No No No No  Type of Advance Directive - Living will - - - - -  Does patient want to make changes to medical advance directive? Yes (MAU/Ambulatory/Procedural Areas - Information given) No - Patient declined - - - - -  Would patient like information on creating a medical advance directive? - - No - Patient declined No - Patient declined No - patient declined information - No - patient declined information  Pre-existing out of facility DNR order (yellow form or pink MOST form) - - - - - - -  Some encounter information is confidential and restricted. Go to Review Flowsheets activity to see all data.    Current Medications (verified) Outpatient Encounter Medications as of 04/18/2017  Medication Sig  . ALPRAZolam (XANAX) 0.5 MG tablet Take 1 tablet (0.5 mg total) by mouth at bedtime.  Marland Kitchen ampicillin (PRINCIPEN) 500 MG capsule Take 1 capsule (500 mg total) by mouth 4 (four) times daily. If you miss a dose, continue past 7 days until all taken  . azelastine (ASTELIN) 0.1 % nasal spray Place 1 spray into both nostrils 2 (two) times daily. Use in each nostril as directed  . Calcium-Magnesium-Vitamin D (CALCIUM 1200+D3 PO) Take 1 tablet by mouth daily. Contains 800iu Vitamin D3  . cholecalciferol (VITAMIN D) 1000 UNITS tablet Take 1 tablet (1,000 Units total) by mouth daily at 12 noon.  .  diphenoxylate-atropine (LOMOTIL) 2.5-0.025 MG tablet TAKE 1 TABLET BY MOUTH 4 TIMES A DAY AS NEEDED FOR DIARRHEA  . DULoxetine (CYMBALTA) 30 MG capsule Take 1 capsule (30 mg total) by mouth at bedtime.  Marland Kitchen esomeprazole (NEXIUM) 40 MG capsule TAKE 1 CAPSULE (40 MG TOTAL) BY MOUTH DAILY.  . fluticasone (FLONASE) 50 MCG/ACT nasal spray Place 2 sprays at bedtime into both nostrils.  . furosemide (LASIX) 40 MG tablet Take 1 tablet (40 mg total) by mouth daily.  Marland Kitchen gabapentin (NEURONTIN) 400 MG capsule Take 1 capsule (400 mg total) 4 (four) times daily by mouth.  . Guaifenesin (MUCINEX MAXIMUM STRENGTH) 1200 MG TB12 Take 1 tablet (1,200 mg total) by mouth every 12 (twelve) hours as needed. (Patient taking differently: Take 1 tablet by mouth every 12 (twelve) hours as needed (cough). )  . hydrOXYzine (ATARAX/VISTARIL) 50 MG tablet Take 2 tablets (100 mg total) by mouth at bedtime. May repeat in 6 hours if needed.  Marland Kitchen levothyroxine (SYNTHROID, LEVOTHROID) 50 MCG tablet Take 1 tablet (50 mcg total) by mouth daily.  . Potassium Chloride ER 20 MEQ TBCR Take 1 tablet by mouth daily. With lasix  . QUEtiapine (SEROQUEL) 50 MG tablet Take 1 tablet (50 mg total) by mouth at bedtime.  . sucralfate (CARAFATE) 1 g tablet TAKE 1 TABLET FOUR TIMES DAILY WITH MEALS AND AT BEDTIME for heartburn,  indigestion, and abdominal pain  . topiramate (TOPAMAX) 50 MG tablet Take 1 tablet (50 mg total) by mouth at bedtime.  . triamcinolone cream (KENALOG) 0.1 % Apply 1 application topically 4 (four) times daily. For itching rash   No facility-administered encounter medications on file as of 04/18/2017.     Allergies (verified) Tramadol and Trazodone and nefazodone   History: Past Medical History:  Diagnosis Date  . Anxiety   . Cholesterol serum increased   . Chronic back pain   . Chronic kidney disease   . Depression   . Diastolic heart failure (HCC)   . GERD (gastroesophageal reflux disease)   . Hyperparathyroidism (HCC)     . Hypertension   . Hypothyroidism   . Migraine   . Neuromuscular disorder (HCC)   . Sleep apnea   . Tachycardia   . Trochanteric bursitis of both hips 2012   Confirmed on MRI   Past Surgical History:  Procedure Laterality Date  . ABDOMINAL HYSTERECTOMY    . APPENDECTOMY    . BACK SURGERY    . KNEE SURGERY     Family History  Problem Relation Age of Onset  . Diabetes Mother   . Heart disease Mother   . Kidney disease Mother   . Thyroid disease Mother   . Heart disease Father   . Seizures Father   . COPD Father   . Cancer Maternal Grandmother    Social History   Socioeconomic History  . Marital status: Married    Spouse name: None  . Number of children: 3  . Years of education: 5712  . Highest education level: Some college, no degree  Social Needs  . Financial resource strain: Not hard at all  . Food insecurity - worry: Never true  . Food insecurity - inability: Never true  . Transportation needs - medical: No  . Transportation needs - non-medical: No  Occupational History  . None  Tobacco Use  . Smoking status: Never Smoker  . Smokeless tobacco: Never Used  Substance and Sexual Activity  . Alcohol use: No  . Drug use: No    Comment: 08-31-2016 PER PT NO   . Sexual activity: Yes    Partners: Male    Comment: married  Other Topics Concern  . None  Social History Narrative  . None    Tobacco Counseling Counseling given: Not Answered   Clinical Intake:  Pre-visit preparation completed: Yes  Pain : 0-10 Pain Score: 9  Pain Type: Chronic pain Pain Location: Back Pain Radiating Towards: righ tleg Pain Descriptors / Indicators: Aching Pain Onset: More than a month ago Pain Frequency: Constant     Nutritional Status: BMI > 30  Obese Nutritional Risks: Nausea/ vomitting/ diarrhea(Patient has been having nausea, vomiting and diarrhea for the past 4-5 months. PCP is aware and currently treating patient. ) Diabetes: No  Activities of Daily Living:  Independent Ambulation: Independent with device- listed below Home Assistive Devices/Equipment: Corliss Blackerane, Eyeglasses, Environmental consultantWalker (specify Type), Shower/tub chair, CPAP(grab bars, high toliet) Medication Administration: Independent Home Management: Independent  Barriers to Care Management & Learning: None  Do you feel unsafe in your current relationship?: No Do you feel physically threatened by others?: No Anyone hurting you at home, work, or school?: No Unable to ask?: No  How often do you need to have someone help you when you read instructions, pamphlets, or other written materials from your doctor or pharmacy?: 1 - Never What is the last grade level you completed in  school?: Some college   Interpreter Needed?: No  Information entered by :: Jennifer Shyalandra Arilynn Blakeney, LPN   Activities of Daily Living In your present state of health, do you have any difficulty performing the following activities: 04/18/2017 04/08/2017  Hearing? N N  Vision? Y N  Comment Patient has issues seeing small print -  Difficulty concentrating or making decisions? Y N  Comment Patient has issues with her memory all the time -  Walking or climbing stairs? Y Y  Comment Patient has a problem climbing stairs due to chronic back pain -  Dressing or bathing? N Y  Doing errands, shopping? N N  Preparing Food and eating ? N -  Using the Toilet? N -  In the past six months, have you accidently leaked urine? N -  Do you have problems with loss of bowel control? N -  Managing your Medications? N -  Managing your Finances? N -  Housekeeping or managing your Housekeeping? N -  Some recent data might be hidden     Timed Get Up and Go Performed: yes, passed, completed within 30 seconds.  Immunizations and Health Maintenance Immunization History  Administered Date(s) Administered  . Influenza Split 02/18/2012  . Influenza,inj,Quad PF,6+ Mos 02/16/2013, 04/18/2017  . Influenza-Unspecified 03/25/2014, 01/02/2016  . Pneumococcal  Polysaccharide-23 06/21/2014, 12/02/2014  . Zoster 08/06/2014   Health Maintenance Due  Topic Date Due  . Hepatitis C Screening  1956-02-09    Patient Care Team: Sherren MochaShaw, Eva N, MD as PCP - General (Family Medicine) Estill Bambergumonski, Mark, MD as Consulting Physician (Orthopedic Surgery) Jethro BolusShapiro, Mark, MD as Consulting Physician (Ophthalmology)  Indicate any recent Medical Services you may have received from other than [facility] providers in the past year (date may be approximate).     Assessment:   This is a routine wellness examination for Jennifer Davidson.   Hearing/Vision screen Hearing Screening Comments: Patient has had a hearing exam in the past and she passed it.  Vision Screening Comments: Patient sees Dr. Nile RiggsShapiro once a year for her regular eye exam.   Dietary issues and exercise activities discussed: Current Exercise Habits: The patient does not participate in regular exercise at present, Exercise limited by: None identified  Goals    . Exercise 3x per week (30 min per time)     Patient states that she wants to start back walking and exercising on the exercise bike.       Depression Screen PHQ 2/9 Scores 04/18/2017 02/19/2017 12/16/2016 12/09/2016 11/08/2016 10/14/2016 09/30/2016  PHQ - 2 Score 6 0 0 0 2 0 4  PHQ- 9 Score 17 - - - 5 - 22    Fall Risk Fall Risk  04/18/2017 02/19/2017 12/16/2016 12/09/2016 11/29/2016  Falls in the past year? Yes Yes Yes Yes Yes  Comment - - - - -  Number falls in past yr: 2 or more 1 2 or more 2 or more 2 or more  Injury with Fall? Yes Yes - No No  Comment Patient fell in shower and was bruised badly.  - - - -  Risk Factor Category  High Fall Risk High Fall Risk - - High Fall Risk  Risk for fall due to : Other (Comment) - - - -  Risk for fall due to: Comment Patient has chronic back pain and has leg weakness and falls a lot.  - - - -  Follow up Falls prevention discussed Falls evaluation completed - - Falls evaluation completed    Is the patient's home  free of loose  throw rugs in walkways, pet beds, electrical cords, etc?   yes      Grab bars in the bathroom? yes      Handrails on the stairs?   yes      Adequate lighting?   yes  Cognitive Function:     6CIT Screen 04/18/2017  What Year? 0 points  What month? 0 points  What time? 0 points  Count back from 20 0 points  Months in reverse 0 points  Repeat phrase 0 points  Total Score 0    Screening Tests Health Maintenance  Topic Date Due  . Hepatitis C Screening  12-Dec-1955  . MAMMOGRAM  04/29/2018  . TETANUS/TDAP  05/17/2018  . COLONOSCOPY  05/18/2019  . INFLUENZA VACCINE  Completed  . HIV Screening  Completed    Cancer Screenings: Lung:  Low Dose CT Chest recommended if Age 80-80 years, 30 pack-year currently smoking OR have quit w/in 15years. Patient does not qualify. Breast:  Up to date on Mammogram? Yes completed 04/30/2016 Up to date of Bone Density/Dexa? not indicated  Colorectal: up to date, colonoscopy completed 05/17/2009  Additional Screenings:  Hepatitis B/HIV/Syphillis:Hep B and Syphillis not indicated, HIV completed 04/08/2017 Hepatitis C Screening: Patient will discuss this with her PCP at her visit and decide whether to have this drawn or not.      Plan:   I have personally reviewed and noted the following in the patient's chart:   . Medical and social history . Use of alcohol, tobacco or illicit drugs  . Current medications and supplements . Functional ability and status . Nutritional status . Physical activity . Advanced directives . List of other physicians . Hospitalizations, surgeries, and ER visits in previous 12 months . Vitals . Screenings to include cognitive, depression, and falls . Referrals and appointments  In addition, I have reviewed and discussed with patient certain preventive protocols, quality metrics, and best practice recommendations. A written personalized care plan for preventive services as well as general preventive health recommendations  were provided to patient.   1. Encounter for Medicare annual wellness exam  2. Need for immunization against influenza - Flu Vaccine QUAD 6+ mos IM (Fluarix)  Jennifer Shy, LPN   16/05/958

## 2017-04-18 NOTE — Patient Instructions (Addendum)
[name] , Thank you for taking time to come for your Medicare Wellness Visit. I appreciate your ongoing commitment to your health goals. Please review the following plan we discussed and let me know if I can assist you in the future.   Screening recommendations/referrals: Colonoscopy: up to date, next due 05/18/2019 Mammogram: up to date, next due 04/30/2018 Bone Density: not indicated until age 63 Recommended yearly ophthalmology/optometry visit for glaucoma screening and checkup Recommended yearly dental visit for hygiene and checkup  Vaccinations: Influenza vaccine: administered today  Pneumococcal vaccine: up to date, Prevnar 55 at age 63 Tdap vaccine: up to date, next due 05/17/2018 Shingles vaccine: up to date, Check with your pharmacy about receiving the Shingrix vaccine    Advanced directives: Advance directive discussed with you today. I have provided a copy for you to complete at home and have notarized. Once this is complete please bring a copy in to our office so we can scan it into your chart.   Conditions/risks identified:Try to start back walking and exercising on the exercise bike.   Next appointment: 04/18/17 @ 10:40 am with Dr. Brigitte Pulse   Preventive Care 40-64 Years, Female Preventive care refers to lifestyle choices and visits with your health care provider that can promote health and wellness. What does preventive care include?  A yearly physical exam. This is also called an annual well check.  Dental exams once or twice a year.  Routine eye exams. Ask your health care provider how often you should have your eyes checked.  Personal lifestyle choices, including:  Daily care of your teeth and gums.  Regular physical activity.  Eating a healthy diet.  Avoiding tobacco and drug use.  Limiting alcohol use.  Practicing safe sex.  Taking low-dose aspirin daily starting at age 63.  Taking vitamin and mineral supplements as recommended by your health care  provider. What happens during an annual well check? The services and screenings done by your health care provider during your annual well check will depend on your age, overall health, lifestyle risk factors, and family history of disease. Counseling  Your health care provider may ask you questions about your:  Alcohol use.  Tobacco use.  Drug use.  Emotional well-being.  Home and relationship well-being.  Sexual activity.  Eating habits.  Work and work Statistician.  Method of birth control.  Menstrual cycle.  Pregnancy history. Screening  You may have the following tests or measurements:  Height, weight, and BMI.  Blood pressure.  Lipid and cholesterol levels. These may be checked every 5 years, or more frequently if you are over 38 years old.  Skin check.  Lung cancer screening. You may have this screening every year starting at age 63 if you have a 30-pack-year history of smoking and currently smoke or have quit within the past 15 years.  Fecal occult blood test (FOBT) of the stool. You may have this test every year starting at age 40.  Flexible sigmoidoscopy or colonoscopy. You may have a sigmoidoscopy every 5 years or a colonoscopy every 10 years starting at age 29.  Hepatitis C blood test.  Hepatitis B blood test.  Sexually transmitted disease (STD) testing.  Diabetes screening. This is done by checking your blood sugar (glucose) after you have not eaten for a while (fasting). You may have this done every 1-3 years.  Mammogram. This may be done every 1-2 years. Talk to your health care provider about when you should start having regular mammograms. This may  depend on whether you have a family history of breast cancer.  BRCA-related cancer screening. This may be done if you have a family history of breast, ovarian, tubal, or peritoneal cancers.  Pelvic exam and Pap test. This may be done every 3 years starting at age 85. Starting at age 60, this may be  done every 5 years if you have a Pap test in combination with an HPV test.  Bone density scan. This is done to screen for osteoporosis. You may have this scan if you are at high risk for osteoporosis. Discuss your test results, treatment options, and if necessary, the need for more tests with your health care provider. Vaccines  Your health care provider may recommend certain vaccines, such as:  Influenza vaccine. This is recommended every year.  Tetanus, diphtheria, and acellular pertussis (Tdap, Td) vaccine. You may need a Td booster every 10 years.  Zoster vaccine. You may need this after age 41.  Pneumococcal 13-valent conjugate (PCV13) vaccine. You may need this if you have certain conditions and were not previously vaccinated.  Pneumococcal polysaccharide (PPSV23) vaccine. You may need one or two doses if you smoke cigarettes or if you have certain conditions. Talk to your health care provider about which screenings and vaccines you need and how often you need them. This information is not intended to replace advice given to you by your health care provider. Make sure you discuss any questions you have with your health care provider. Document Released: 05/30/2015 Document Revised: 01/21/2016 Document Reviewed: 03/04/2015 Elsevier Interactive Patient Education  2017 [employer] Prevention in the Home Falls can cause injuries. They can happen to people of all ages. There are many things you can do to make your home safe and to help prevent falls. What can I do on the outside of my home?  Regularly fix the edges of walkways and driveways and fix any cracks.  Remove anything that might make you trip as you walk through a door, such as a raised step or threshold.  Trim any bushes or trees on the path to your home.  Use bright outdoor lighting.  Clear any walking paths of anything that might make someone trip, such as rocks or tools.  Regularly check to see if  handrails are loose or broken. Make sure that both sides of any steps have handrails.  Any raised decks and porches should have guardrails on the edges.  Have any leaves, snow, or ice cleared regularly.  Use sand or salt on walking paths during winter.  Clean up any spills in your garage right away. This includes oil or grease spills. What can I do in the bathroom?  Use night lights.  Install grab bars by the toilet and in the tub and shower. Do not use towel bars as grab bars.  Use non-skid mats or decals in the tub or shower.  If you need to sit down in the shower, use a plastic, non-slip stool.  Keep the floor dry. Clean up any water that spills on the floor as soon as it happens.  Remove soap buildup in the tub or shower regularly.  Attach bath mats securely with double-sided non-slip rug tape.  Do not have throw rugs and other things on the floor that can make you trip. What can I do in the bedroom?  Use night lights.  Make sure that you have a light by your bed that is easy to reach.  Do not use  any sheets or blankets that are too big for your bed. They should not hang down onto the floor.  Have a firm chair that has side arms. You can use this for support while you get dressed.  Do not have throw rugs and other things on the floor that can make you trip. What can I do in the kitchen?  Clean up any spills right away.  Avoid walking on wet floors.  Keep items that you use a lot in easy-to-reach places.  If you need to reach something above you, use a strong step stool that has a grab bar.  Keep electrical cords out of the way.  Do not use floor polish or wax that makes floors slippery. If you must use wax, use non-skid floor wax.  Do not have throw rugs and other things on the floor that can make you trip. What can I do with my stairs?  Do not leave any items on the stairs.  Make sure that there are handrails on both sides of the stairs and use them. Fix  handrails that are broken or loose. Make sure that handrails are as long as the stairways.  Check any carpeting to make sure that it is firmly attached to the stairs. Fix any carpet that is loose or worn.  Avoid having throw rugs at the top or bottom of the stairs. If you do have throw rugs, attach them to the floor with carpet tape.  Make sure that you have a light switch at the top of the stairs and the bottom of the stairs. If you do not have them, ask someone to add them for you. What else can I do to help prevent falls?  Wear shoes that:  Do not have high heels.  Have rubber bottoms.  Are comfortable and fit you well.  Are closed at the toe. Do not wear sandals.  If you use a stepladder:  Make sure that it is fully opened. Do not climb a closed stepladder.  Make sure that both sides of the stepladder are locked into place.  Ask someone to hold it for you, if possible.  Clearly mark and make sure that you can see:  Any grab bars or handrails.  First and last steps.  Where the edge of each step is.  Use tools that help you move around (mobility aids) if they are needed. These include:  Canes.  Walkers.  Scooters.  Crutches.  Turn on the lights when you go into a dark area. Replace any light bulbs as soon as they burn out.  Set up your furniture so you have a clear path. Avoid moving your furniture around.  If any of your floors are uneven, fix them.  If there are any pets around you, be aware of where they are.  Review your medicines with your doctor. Some medicines can make you feel dizzy. This can increase your chance of falling. Ask your doctor what other things that you can do to help prevent falls. This information is not intended to replace advice given to you by your health care provider. Make sure you discuss any questions you have with your health care provider. Document Released: 02/27/2009 Document Revised: 10/09/2015 Document Reviewed:  06/07/2014 Elsevier Interactive Patient Education  2017 [employer].

## 2017-04-18 NOTE — Progress Notes (Signed)
Subjective:    Patient ID: Jennifer Davidson, female    DOB: [birth year], 61 y.o.   MRN: [id number] Chief Complaint  Patient presents with  . Follow-up    hospital follow up 04/10/17,  dicyclomine, levothyroxine, duloxetine, topiramate    HPI  She was given her nighttime medicine in the morning yesterday and so slept all day but she is doing a little better - sleeping a little better. Diarrhea has stopped since she has been home.  Not eating as not hungry, does have abdominal pain.  Took over a week to get a lot of the medicine at of her system.  Shot in hip in Wed and next Monday she goes to the class that is starting 9-12 for the intensive outpt.  She saw a derm PA for hives her told her that the urtricaria was due to stress. Gve her a med that they haven't filled.  Right after she was sent home, she did wake up with a few halucinations. D/C valium.  Past Medical History:  Diagnosis Date  . Anxiety   . Cholesterol serum increased   . Chronic back pain   . Chronic kidney disease   . Depression   . Diastolic heart failure (HCC)   . GERD (gastroesophageal reflux disease)   . Hyperparathyroidism (HCC)   . Hypertension   . Hypothyroidism   . Migraine   . Neuromuscular disorder (HCC)   . Sleep apnea   . Tachycardia   . Trochanteric bursitis of both hips 2012   Confirmed on MRI   Past Surgical History:  Procedure Laterality Date  . ABDOMINAL HYSTERECTOMY    . APPENDECTOMY    . BACK SURGERY    . KNEE SURGERY     Current Outpatient Medications on File Prior to Visit  Medication Sig Dispense Refill  . ampicillin (PRINCIPEN) 500 MG capsule Take 1 capsule (500 mg total) by mouth 4 (four) times daily. If you miss a dose, continue past 7 days until all taken 28 capsule 0  . azelastine (ASTELIN) 0.1 % nasal spray Place 1 spray into both nostrils 2 (two) times daily. Use in each nostril as directed 30 mL 12  . Calcium-Magnesium-Vitamin D (CALCIUM 1200+D3 PO) Take 1 tablet by mouth  daily. Contains 800iu Vitamin D3    . cholecalciferol (VITAMIN D) 1000 UNITS tablet Take 1 tablet (1,000 Units total) by mouth daily at 12 noon. 30 tablet 0  . esomeprazole (NEXIUM) 40 MG capsule TAKE 1 CAPSULE (40 MG TOTAL) BY MOUTH DAILY. 90 capsule 3  . fluticasone (FLONASE) 50 MCG/ACT nasal spray Place 2 sprays at bedtime into both nostrils. 48 g 0  . furosemide (LASIX) 40 MG tablet Take 1 tablet (40 mg total) by mouth daily. 90 tablet 1  . gabapentin (NEURONTIN) 400 MG capsule Take 1 capsule (400 mg total) 4 (four) times daily by mouth. 360 capsule 1  . Potassium Chloride ER 20 MEQ TBCR Take 1 tablet by mouth daily. With lasix 90 tablet 1  . QUEtiapine (SEROQUEL) 50 MG tablet Take 1 tablet (50 mg total) by mouth at bedtime.    . sucralfate (CARAFATE) 1 g tablet TAKE 1 TABLET FOUR TIMES DAILY WITH MEALS AND AT BEDTIME for heartburn, indigestion, and abdominal pain 360 tablet 1  . triamcinolone cream (KENALOG) 0.1 % Apply 1 application topically 4 (four) times daily. For itching rash 453.6 g 0   No current facility-administered medications on file prior to visit.    Allergies  Allergen Reactions  .  Tramadol Nausea And Vomiting  . Trazodone And Nefazodone Other (See Comments)    Per pt trazodone caused hallucinations and behavior changes    Family History  Problem Relation Age of Onset  . Diabetes Mother   . Heart disease Mother   . Kidney disease Mother   . Thyroid disease Mother   . Heart disease Father   . Seizures Father   . COPD Father   . Cancer Maternal Grandmother    Social History   Socioeconomic History  . Marital status: Married    Spouse name: None  . Number of children: 3  . Years of education: 4112  . Highest education level: Some college, no degree  Social Needs  . Financial resource strain: Not hard at all  . Food insecurity - worry: Never true  . Food insecurity - inability: Never true  . Transportation needs - medical: No  . Transportation needs -  non-medical: No  Occupational History  . None  Tobacco Use  . Smoking status: Never Smoker  . Smokeless tobacco: Never Used  Substance and Sexual Activity  . Alcohol use: No  . Drug use: No    Comment: 08-31-2016 PER PT NO   . Sexual activity: Yes    Partners: Male    Comment: married  Other Topics Concern  . None  Social History Narrative  . None   Depression screen [facility] 2[facility]9 04/18/2017 04/18/2017 02/19/2017 12/16/2016 12/09/2016  Decreased Interest 0 3 0 0 0  Down, Depressed, Hopeless 0 3 0 0 0  PHQ - 2 Score 0 6 0 0 0  Altered sleeping - 3 - - -  Tired, decreased energy - 3 - - -  Change in appetite - 1 - - -  Feeling bad or failure about yourself  - 1 - - -  Trouble concentrating - 3 - - -  Moving slowly or fidgety/restless - 0 - - -  Suicidal thoughts - 0 - - -  PHQ-9 Score - 17 - - -  Difficult doing work/chores - Very difficult - - -  Some recent data might be hidden     Review of Systems See hpi    Objective:   Physical Exam  Constitutional: She is oriented to person, place, and time. She appears well-developed and well-nourished. No distress.  HENT:  Head: Normocephalic and atraumatic.  Right Ear: External ear normal.  Left Ear: External ear normal.  Eyes: Conjunctivae are normal. No scleral icterus.  Neck: Normal range of motion. Neck supple. No thyromegaly present.  Cardiovascular: Normal rate, regular rhythm, normal heart sounds and intact distal pulses.  Pulmonary/Chest: Effort normal and breath sounds normal. No respiratory distress.  Musculoskeletal: She exhibits no edema.  Lymphadenopathy:    She has no cervical adenopathy.  Neurological: She is alert and oriented to person, place, and time.  Skin: Skin is warm and dry. She is not diaphoretic. No erythema.  Psychiatric: She has a normal mood and affect. Her behavior is normal.       BP 108/70   Pulse 62   Temp 97.7 F (36.5 C)   Resp 16   Ht 5\' 3"  (1.6 m)   Wt 213 lb 3.2 oz (96.7 kg)   SpO2  94%   BMI 37.77 kg/m      Assessment & Plan:  tsh perfect at 0.775 10d ago.  1. Acute cystitis without hematuria - complete course of keflex  2. Hypokalemia   3. Stage 3 chronic kidney disease (  HCC)   4.      Irritable bowel syndrome with diarrhea - has been using A LOT of lomotil due to sxs - concern it could exacerbate psych sxs so will stop and try cholestyramine or other bile acid binding instead. If sxs recur freq, may want to try rifaximen. 5.      Severe recurrent depression with psychosis - doing better on decreased med regimen so cont cymbalta 30 (reduced dose), off of valium w/o recurrence of sz like episodes so far, but still needing alprazolam 0.5 qhs and is using qd prn if she is getting really worked up and Educational psychologistpanicky. Starting IOP this week.  Orders Placed This Encounter  Procedures  . Urine Culture  . Basic metabolic panel    Order Specific Question:   Has the patient fasted?    Answer:   No  . CBC with Differential/Platelet  . CBC with Differential/Platelet  . POCT urinalysis dipstick  . POCT Microscopic Urinalysis ([facility])    Meds ordered this encounter  Medications  . cimetidine (TAGAMET) 400 MG tablet    Sig: Take 1 tablet (400 mg total) by mouth 3 (three) times daily.    Dispense:  270 tablet    Refill:  1  . cholestyramine (QUESTRAN) 4 GM/DOSE powder    Sig: Take 1 packet (4 g total) by mouth 3 (three) times daily with meals. As needed for diarrhea    Dispense:  378 g    Refill:  2  . levothyroxine (SYNTHROID, LEVOTHROID) 50 MCG tablet    Sig: Take 1 tablet (50 mcg total) by mouth daily.    Dispense:  90 tablet    Refill:  1  . DULoxetine (CYMBALTA) 30 MG capsule    Sig: Take 1 capsule (30 mg total) by mouth at bedtime.    Dispense:  90 capsule    Refill:  0  . ALPRAZolam (XANAX) 0.5 MG tablet    Sig: Take 1 tablet (0.5 mg total) by mouth at bedtime.    Dispense:  90 tablet    Refill:  0  . topiramate (TOPAMAX) 50 MG tablet    Sig: Take 1 tablet (50  mg total) by mouth at bedtime.    Dispense:  90 tablet    Refill:  0    Norberto SorensonEva Shaw, M.D.  [facility]  [place] [address] [place], [place] [address] [phone] phone [phone] fax  04/25/17 9:48 AM

## 2017-04-19 LAB — CBC WITH DIFFERENTIAL/PLATELET
BASOS: 1 %
Basophils Absolute: 0.1 10*3/uL (ref 0.0–0.2)
EOS (ABSOLUTE): 0.5 10*3/uL — ABNORMAL HIGH (ref 0.0–0.4)
EOS: 6 %
HEMATOCRIT: 39.1 % (ref 34.0–46.6)
HEMOGLOBIN: 12.4 g/dL (ref 11.1–15.9)
IMMATURE GRANS (ABS): 0 10*3/uL (ref 0.0–0.1)
IMMATURE GRANULOCYTES: 0 %
Lymphocytes Absolute: 4.1 10*3/uL — ABNORMAL HIGH (ref 0.7–3.1)
Lymphs: 46 %
MCH: 25.9 pg — AB (ref 26.6–33.0)
MCHC: 31.7 g/dL (ref 31.5–35.7)
MCV: 82 fL (ref 79–97)
MONOCYTES: 6 %
Monocytes Absolute: 0.6 10*3/uL (ref 0.1–0.9)
NEUTROS ABS: 3.6 10*3/uL (ref 1.4–7.0)
Neutrophils: 41 %
PLATELETS: 272 10*3/uL (ref 150–379)
RBC: 4.78 x10E6/uL (ref 3.77–5.28)
RDW: 16.2 % — ABNORMAL HIGH (ref 12.3–15.4)
WBC: 8.8 10*3/uL (ref 3.4–10.8)

## 2017-04-19 LAB — BASIC METABOLIC PANEL
BUN/Creatinine Ratio: 10 — ABNORMAL LOW (ref 12–28)
BUN: 16 mg/dL (ref 8–27)
CALCIUM: 9.8 mg/dL (ref 8.7–10.3)
CO2: 24 mmol/L (ref 20–29)
CREATININE: 1.65 mg/dL — AB (ref 0.57–1.00)
Chloride: 99 mmol/L (ref 96–106)
GFR, EST AFRICAN AMERICAN: 38 mL/min/{1.73_m2} — AB (ref >59)
GFR, EST NON AFRICAN AMERICAN: 33 mL/min/{1.73_m2} — AB (ref >59)
Glucose: 85 mg/dL (ref 65–99)
POTASSIUM: 3.9 mmol/L (ref 3.5–5.2)
Sodium: 143 mmol/L (ref 134–144)

## 2017-04-20 DIAGNOSIS — M533 Sacrococcygeal disorders, not elsewhere classified: Secondary | ICD-10-CM | POA: Diagnosis not present

## 2017-04-20 LAB — URINE CULTURE: Organism ID, Bacteria: NO GROWTH

## 2017-04-25 DIAGNOSIS — K580 Irritable bowel syndrome with diarrhea: Secondary | ICD-10-CM | POA: Insufficient documentation

## 2017-04-25 MED ORDER — ALPRAZOLAM 0.5 MG PO TABS: 0.5000 mg | ORAL_TABLET | Freq: Every day | ORAL | 0 refills | Status: DC

## 2017-04-25 MED ORDER — LEVOTHYROXINE SODIUM 50 MCG PO TABS: 50.0000 ug | ORAL_TABLET | Freq: Every day | ORAL | 1 refills | Status: DC

## 2017-04-25 MED ORDER — CHOLESTYRAMINE 4 GM/DOSE PO POWD: 4.0000 g | Freq: Three times a day (TID) | ORAL | 2 refills | Status: DC

## 2017-04-25 MED ORDER — DULOXETINE HCL 30 MG PO CPEP: 30.0000 mg | ORAL_CAPSULE | Freq: Every day | ORAL | 0 refills | Status: DC

## 2017-04-25 MED ORDER — TOPIRAMATE 50 MG PO TABS: 50.0000 mg | ORAL_TABLET | Freq: Every day | ORAL | 0 refills | Status: DC

## 2017-04-26 ENCOUNTER — Encounter: Payer: Self-pay | Admitting: Family Medicine

## 2017-05-16 ENCOUNTER — Telehealth (HOSPITAL_COMMUNITY): Payer: Self-pay | Admitting: *Deleted

## 2017-05-16 NOTE — Telephone Encounter (Signed)
phone call to patient regarding an appointment,  husband answer phone, said she is asleep.

## 2017-05-23 ENCOUNTER — Ambulatory Visit: Payer: Medicare HMO | Admitting: Family Medicine

## 2017-06-03 ENCOUNTER — Ambulatory Visit: Payer: Self-pay | Admitting: Neurology

## 2017-06-03 ENCOUNTER — Telehealth: Payer: Self-pay | Admitting: Family Medicine

## 2017-06-03 MED ORDER — HYDROXYZINE PAMOATE 100 MG PO CAPS: 200.0000 mg | ORAL_CAPSULE | Freq: Every day | ORAL | 1 refills | Status: DC

## 2017-06-03 NOTE — Telephone Encounter (Signed)
Pt's husband sent me a message stating that Jennifer Davidson is still doing well but she has had a few bad days but better good days.  She needs a refill on the hydroxyzine - taking 200 mg.  Might come in for a visit next week unless she is continuing to do so well as she doesn't feel she need to.

## 2017-06-06 ENCOUNTER — Ambulatory Visit: Payer: Medicare HMO | Admitting: Family Medicine

## 2017-06-14 ENCOUNTER — Ambulatory Visit: Payer: Self-pay | Admitting: Neurology

## 2017-06-21 ENCOUNTER — Emergency Department (HOSPITAL_COMMUNITY)
Admission: EM | Admit: 2017-06-21 | Discharge: 2017-06-21 | Disposition: A | Discharge status: Home / Self Care | Payer: Medicare HMO | Attending: Emergency Medicine | Admitting: Emergency Medicine

## 2017-06-21 ENCOUNTER — Encounter (HOSPITAL_COMMUNITY): Payer: Self-pay

## 2017-06-21 DIAGNOSIS — I5030 Unspecified diastolic (congestive) heart failure: Secondary | ICD-10-CM | POA: Insufficient documentation

## 2017-06-21 DIAGNOSIS — I130 Hypertensive heart and chronic kidney disease with heart failure and stage 1 through stage 4 chronic kidney disease, or unspecified chronic kidney disease: Secondary | ICD-10-CM | POA: Diagnosis not present

## 2017-06-21 DIAGNOSIS — Z79899 Other long term (current) drug therapy: Secondary | ICD-10-CM | POA: Insufficient documentation

## 2017-06-21 DIAGNOSIS — E039 Hypothyroidism, unspecified: Secondary | ICD-10-CM | POA: Diagnosis not present

## 2017-06-21 DIAGNOSIS — E860 Dehydration: Secondary | ICD-10-CM | POA: Diagnosis not present

## 2017-06-21 DIAGNOSIS — R112 Nausea with vomiting, unspecified: Secondary | ICD-10-CM

## 2017-06-21 DIAGNOSIS — N3000 Acute cystitis without hematuria: Secondary | ICD-10-CM | POA: Insufficient documentation

## 2017-06-21 DIAGNOSIS — R197 Diarrhea, unspecified: Secondary | ICD-10-CM | POA: Insufficient documentation

## 2017-06-21 DIAGNOSIS — N189 Chronic kidney disease, unspecified: Secondary | ICD-10-CM | POA: Insufficient documentation

## 2017-06-21 LAB — COMPREHENSIVE METABOLIC PANEL
ALK PHOS: 77 U/L (ref 38–126)
ALT: 30 U/L (ref 14–54)
ANION GAP: 12 (ref 5–15)
AST: 34 U/L (ref 15–41)
Albumin: 4.2 g/dL (ref 3.5–5.0)
BILIRUBIN TOTAL: 0.5 mg/dL (ref 0.3–1.2)
BUN: 15 mg/dL (ref 6–20)
CO2: 23 mmol/L (ref 22–32)
CREATININE: 1.8 mg/dL — AB (ref 0.44–1.00)
Calcium: 9.8 mg/dL (ref 8.9–10.3)
Chloride: 102 mmol/L (ref 101–111)
GFR, EST AFRICAN AMERICAN: 33 mL/min — AB (ref >60)
GFR, EST NON AFRICAN AMERICAN: 29 mL/min — AB (ref >60)
Glucose, Bld: 117 mg/dL — ABNORMAL HIGH (ref 65–99)
Potassium: 3.9 mmol/L (ref 3.5–5.1)
Sodium: 137 mmol/L (ref 135–145)
TOTAL PROTEIN: 7.4 g/dL (ref 6.5–8.1)

## 2017-06-21 LAB — URINALYSIS, ROUTINE W REFLEX MICROSCOPIC
BILIRUBIN URINE: NEGATIVE
Glucose, UA: NEGATIVE mg/dL
HGB URINE DIPSTICK: NEGATIVE
Ketones, ur: NEGATIVE mg/dL
NITRITE: NEGATIVE
PH: 5 (ref 5.0–8.0)
Protein, ur: NEGATIVE mg/dL
Specific Gravity, Urine: 1.015 (ref 1.005–1.030)

## 2017-06-21 LAB — CBC WITH DIFFERENTIAL/PLATELET
Basophils Absolute: 0 10*3/uL (ref 0.0–0.1)
Basophils Relative: 0 %
Eosinophils Absolute: 0.3 10*3/uL (ref 0.0–0.7)
Eosinophils Relative: 4 %
HEMATOCRIT: 41.3 % (ref 36.0–46.0)
HEMOGLOBIN: 13.2 g/dL (ref 12.0–15.0)
LYMPHS ABS: 2.8 10*3/uL (ref 0.7–4.0)
LYMPHS PCT: 32 %
MCH: 24.6 pg — AB (ref 26.0–34.0)
MCHC: 32 g/dL (ref 30.0–36.0)
MCV: 77.1 fL — AB (ref 78.0–100.0)
MONOS PCT: 9 %
Monocytes Absolute: 0.8 10*3/uL (ref 0.1–1.0)
NEUTROS ABS: 5 10*3/uL (ref 1.7–7.7)
NEUTROS PCT: 55 %
Platelets: 264 10*3/uL (ref 150–400)
RBC: 5.36 MIL/uL — AB (ref 3.87–5.11)
RDW: 14.2 % (ref 11.5–15.5)
WBC: 8.9 10*3/uL (ref 4.0–10.5)

## 2017-06-21 MED ORDER — SODIUM CHLORIDE 0.9 % IV BOLUS (SEPSIS)
1000.0000 mL | Freq: Once | INTRAVENOUS | Status: AC
  Administered 2017-06-21: 1000 mL via INTRAVENOUS

## 2017-06-21 MED ORDER — ONDANSETRON 4 MG PO TBDP: 4.0000 mg | ORAL_TABLET | Freq: Three times a day (TID) | ORAL | 0 refills | Status: DC | PRN

## 2017-06-21 MED ORDER — ONDANSETRON 4 MG PO TBDP
4.0000 mg | ORAL_TABLET | Freq: Once | ORAL | Status: AC | PRN
  Administered 2017-06-21: 4 mg via ORAL
  Filled 2017-06-21: qty 1

## 2017-06-21 MED ORDER — CEPHALEXIN 500 MG PO CAPS: 500.0000 mg | ORAL_CAPSULE | Freq: Four times a day (QID) | ORAL | 0 refills | Status: DC

## 2017-06-21 NOTE — ED Provider Notes (Signed)
[facility] EMERGENCY DEPARTMENT Provider Note   CSN: [id number] Arrival date & time: 06/21/17  1020     History   Chief Complaint Chief Complaint  Patient presents with  . Emesis  . Weakness    HPI Jennifer Davidson is a 62 y.o. female.  Pt presents to the ED today with n/v/d.  The pt has had sx for 3 weeks, but it is worse today.  She was given zofran while waiting in triage and is feeling better.  She said she's not urinated today.  Her husband said she was hallucinating earlier today which she does when she gets dehydrated.      Past Medical History:  Diagnosis Date  . Anxiety   . Cholesterol serum increased   . Chronic back pain    chronic Rt low back pain. s/p L4-5 fusion. failed Rt facet injections. poss due to Rt SI joint dysfunction.  . Chronic kidney disease   . Depression   . Diastolic heart failure (HCC)   . GERD (gastroesophageal reflux disease)   . Hyperparathyroidism (HCC)   . Hypertension   . Hypothyroidism   . Migraine   . Neuromuscular disorder (HCC)   . Sleep apnea   . Tachycardia   . Trochanteric bursitis of both hips 2012   Confirmed on MRI    Patient Active Problem List   Diagnosis Date Noted  . Irritable bowel syndrome with diarrhea 04/25/2017  . Sacroiliac joint dysfunction of right side 03/17/2017  . 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  . Seizure-like activity (HCC) 09/14/2016  . Moderate episode of recurrent major depressive disorder (HCC) 08/31/2016  . PTSD (post-traumatic stress disorder) 08/31/2016  . UTI (urinary tract infection) 06/25/2016  . Acute encephalopathy 06/25/2016  . Severe recurrent depression with psychosis (HCC) 06/25/2016  . Acute gastroenteritis 06/23/2016  . GERD (gastroesophageal reflux disease) 11/13/2015  . Syncope 08/30/2015  . Generalized anxiety disorder 06/14/2015  . Chronic pain of right lower extremity 05/14/2015  . Neuropathy of lower extremity 05/14/2015  . Headache, migraine  01/17/2015  . Severe recurrent major depressive disorder with psychotic features (HCC)   . Major depression, recurrent, chronic (HCC) 12/02/2014  . Mood disorder (HCC) 12/01/2014  . Chronic back pain   . Hyperparathyroidism, 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  . Neuromuscular disorder (HCC) 07/31/2014  . Lower back pain 09/21/2013  . Diastolic heart failure (HCC) 03/17/2012  . Syncope and collapse 02/12/2012  . Hypertension 02/04/2012  . Chronic kidney disease 02/04/2012  . Edema 02/04/2012  . Tachycardia 02/04/2012  . Encounter for long-term (current) use of medications 02/04/2012  . Anxiety and depression 02/04/2012  . Hypothyroid 02/04/2012  . Hyperlipidemia 02/04/2012    Past Surgical History:  Procedure Laterality Date  . ABDOMINAL HYSTERECTOMY    . APPENDECTOMY    . BACK SURGERY     L4-5 fusion  . KNEE SURGERY      OB History    No data available       Home Medications    Prior to Admission medications   Medication Sig Start Date End Date Taking? Authorizing Provider  ALPRAZolam Prudy Feeler) 0.5 MG tablet Take 1 tablet (0.5 mg total) by mouth at bedtime. 04/25/17  Yes [name], MD  azelastine (ASTELIN) 0.1 % nasal spray Place 1 spray into both nostrils 2 (two) times daily. Use in each nostril as directed 09/09/16  Yes [name], MD  Calcium-Magnesium-Vitamin D (CALCIUM 1200+D3 PO) Take 1 tablet by mouth every morning. Contains  800iu Vitamin D3    Yes [provider]  cholecalciferol (VITAMIN D) 1000 UNITS tablet Take 1 tablet (1,000 Units total) by mouth daily at 12 noon. Patient taking differently: Take 1,000 Units by mouth every morning.  12/05/14  Yes Adonis BrookAgustin, Sheila, NP  cholestyramine Lanetta Inch(QUESTRAN) 4 GM/DOSE powder Take 1 packet (4 g total) by mouth 3 (three) times daily with meals. As needed for diarrhea 04/25/17  Yes Sherren MochaShaw, Eva N, MD  cimetidine (TAGAMET) 400 MG tablet Take 1 tablet (400 mg total) by mouth 3 (three) times daily. 04/18/17  Yes Sherren MochaShaw, Eva N, MD    diclofenac sodium (VOLTAREN) 1 % GEL Apply 2 g topically daily as needed (for knee pain).   Yes [provider]  DULoxetine (CYMBALTA) 30 MG capsule Take 1 capsule (30 mg total) by mouth at bedtime. 04/25/17  Yes Sherren MochaShaw, Eva N, MD  esomeprazole (NEXIUM) 40 MG capsule TAKE 1 CAPSULE (40 MG TOTAL) BY MOUTH DAILY. 12/09/16  Yes Sherren MochaShaw, Eva N, MD  fluticasone (FLONASE) 50 MCG/ACT nasal spray Place 2 sprays at bedtime into both nostrils. 03/25/17  Yes Sherren MochaShaw, Eva N, MD  furosemide (LASIX) 40 MG tablet Take 1 tablet (40 mg total) by mouth daily. Patient taking differently: Take 40 mg by mouth every morning.  12/09/16  Yes Sherren MochaShaw, Eva N, MD  gabapentin (NEURONTIN) 400 MG capsule Take 1 capsule (400 mg total) 4 (four) times daily by mouth. 03/29/17  Yes Sherren MochaShaw, Eva N, MD  hydrOXYzine (VISTARIL) 100 MG capsule Take 2 capsules (200 mg total) by mouth at bedtime. 06/03/17  Yes Sherren MochaShaw, Eva N, MD  levothyroxine (SYNTHROID, LEVOTHROID) 50 MCG tablet Take 1 tablet (50 mcg total) by mouth daily. Patient taking differently: Take 50 mcg by mouth every morning.  04/25/17  Yes Sherren MochaShaw, Eva N, MD  Potassium Chloride ER 20 MEQ TBCR Take 1 tablet by mouth daily. With lasix Patient taking differently: Take 1 tablet by mouth every morning. With lasix 12/09/16  Yes Sherren MochaShaw, Eva N, MD  QUEtiapine (SEROQUEL) 50 MG tablet Take 1 tablet (50 mg total) by mouth at bedtime. 04/10/17  Yes Kathlen ModyAkula, Vijaya, MD  sucralfate (CARAFATE) 1 g tablet TAKE 1 TABLET FOUR TIMES DAILY WITH MEALS AND AT BEDTIME for heartburn, indigestion, and abdominal pain 02/19/17  Yes Sherren MochaShaw, Eva N, MD  topiramate (TOPAMAX) 50 MG tablet Take 1 tablet (50 mg total) by mouth at bedtime. 04/25/17  Yes Sherren MochaShaw, Eva N, MD  triamcinolone cream (KENALOG) 0.1 % Apply 1 application topically 4 (four) times daily. For itching rash 12/16/16  Yes Sherren MochaShaw, Eva N, MD  ampicillin (PRINCIPEN) 500 MG capsule Take 1 capsule (500 mg total) by mouth 4 (four) times daily. If you miss a dose, continue past  7 days until all taken Patient not taking: Reported on 06/21/2017 04/16/17   Sherren MochaShaw, Eva N, MD  cephALEXin (KEFLEX) 500 MG capsule Take 1 capsule (500 mg total) by mouth 4 (four) times daily. 06/21/17   Jacalyn LefevreHaviland, Clint Strupp, MD  ondansetron (ZOFRAN ODT) 4 MG disintegrating tablet Take 1 tablet (4 mg total) by mouth every 8 (eight) hours as needed. 06/21/17   Jacalyn LefevreHaviland, Niti Leisure, MD    Family History Family History  Problem Relation Age of Onset  . Diabetes Mother   . Heart disease Mother   . Kidney disease Mother   . Thyroid disease Mother   . Heart disease Father   . Seizures Father   . COPD Father   . Cancer Maternal Grandmother     Social  History Social History   Tobacco Use  . Smoking status: Never Smoker  . Smokeless tobacco: Never Used  Substance Use Topics  . Alcohol use: No  . Drug use: No    Comment: 08-31-2016 PER PT NO      Allergies   Tramadol and Trazodone and nefazodone   Review of Systems Review of Systems  Gastrointestinal: Positive for diarrhea, nausea and vomiting.  All other systems reviewed and are negative.    Physical Exam Updated Vital Signs BP 115/64   Pulse 76   Temp 97.9 F (36.6 C) (Oral)   Resp 18   Ht 5\' 3"  (1.6 m)   Wt 96.6 kg (213 lb)   SpO2 95%   BMI 37.73 kg/m   Physical Exam  Constitutional: She is oriented to person, place, and time. She appears well-developed and well-nourished.  HENT:  Head: Normocephalic and atraumatic.  Right Ear: External ear normal.  Left Ear: External ear normal.  Nose: Nose normal.  Mouth/Throat: Oropharynx is clear and moist.  Eyes: Conjunctivae and EOM are normal. Pupils are equal, round, and reactive to light.  Neck: Normal range of motion. Neck supple.  Cardiovascular: Normal rate, regular rhythm, normal heart sounds and intact distal pulses.  Pulmonary/Chest: Effort normal and breath sounds normal.  Abdominal: Soft. Bowel sounds are normal.  Musculoskeletal: Normal range of motion.  Neurological: She  is alert and oriented to person, place, and time.  Skin: Skin is warm. Capillary refill takes less than 2 seconds.  Psychiatric: She has a normal mood and affect. Her behavior is normal. Judgment and thought content normal.  Nursing note and vitals reviewed.    ED Treatments / Results  Labs (all labs ordered are listed, but only abnormal results are displayed) Labs Reviewed  CBC WITH DIFFERENTIAL/PLATELET - Abnormal; Notable for the following components:      Result Value   RBC 5.36 (*)    MCV 77.1 (*)    MCH 24.6 (*)    All other components within normal limits  COMPREHENSIVE METABOLIC PANEL - Abnormal; Notable for the following components:   Glucose, Bld 117 (*)    Creatinine, Ser 1.80 (*)    GFR calc non Af Amer 29 (*)    GFR calc Af Amer 33 (*)    All other components within normal limits  URINALYSIS, ROUTINE W REFLEX MICROSCOPIC - Abnormal; Notable for the following components:   APPearance HAZY (*)    Leukocytes, UA SMALL (*)    Bacteria, UA RARE (*)    Squamous Epithelial / LPF 6-30 (*)    All other components within normal limits  URINE CULTURE    EKG  EKG Interpretation None       Radiology No results found.  Procedures Procedures (including critical care time)  Medications Ordered in ED Medications  ondansetron (ZOFRAN-ODT) disintegrating tablet 4 mg (4 mg Oral Given 06/21/17 1220)  sodium chloride 0.9 % bolus 1,000 mL (1,000 mLs Intravenous New Bag/Given 06/21/17 1650)     Initial Impression / Assessment and Plan / ED Course  I have reviewed the triage vital signs and the nursing notes.  Pertinent labs & imaging results that were available during my care of the patient were reviewed by me and considered in my medical decision making (see chart for details).    Pt is feeling much better.  She is able to tolerate po fluids.  She has had similar sx with UTI, so urine will be sent for culture and she  will be started on keflex.  Return if worse.  Final  Clinical Impressions(s) / ED Diagnoses   Final diagnoses:  Dehydration  Acute cystitis without hematuria  Nausea vomiting and diarrhea    ED Discharge Orders        Ordered    ondansetron (ZOFRAN ODT) 4 MG disintegrating tablet  Every 8 hours PRN     06/21/17 1829    cephALEXin (KEFLEX) 500 MG capsule  4 times daily     06/21/17 1829       Jacalyn Lefevre, MD 06/21/17 [phone]

## 2017-06-21 NOTE — ED Notes (Signed)
Pt reports Zofran improved her nausea.

## 2017-06-21 NOTE — ED Notes (Signed)
Pt ambulated to bathroom. Pt required a two person assist upon standing but upon walking back to bedroom pt was stronger and ambulatory with minimal assistance.

## 2017-06-21 NOTE — ED Triage Notes (Signed)
Husband reports pt has had increased fatigue, n/v/d x 3 weeks.  Denies pain.

## 2017-06-21 NOTE — ED Notes (Signed)
Pt sitting in wheelchair in no distress.

## 2017-06-21 NOTE — ED Notes (Signed)
Pt provided with warm blanket.  C/o nausea with no emesis.  Zofran ordered per protocol.

## 2017-06-23 ENCOUNTER — Emergency Department (HOSPITAL_COMMUNITY): Payer: Medicare HMO

## 2017-06-23 ENCOUNTER — Encounter (HOSPITAL_COMMUNITY): Payer: Self-pay | Admitting: Emergency Medicine

## 2017-06-23 ENCOUNTER — Observation Stay (HOSPITAL_COMMUNITY)
Admission: EM | Admit: 2017-06-23 | Discharge: 2017-06-24 | Disposition: A | Discharge status: Home / Self Care | Payer: Medicare HMO | Attending: Internal Medicine | Admitting: Internal Medicine

## 2017-06-23 ENCOUNTER — Ambulatory Visit: Payer: Medicare HMO | Admitting: Family Medicine

## 2017-06-23 ENCOUNTER — Other Ambulatory Visit: Payer: Self-pay

## 2017-06-23 DIAGNOSIS — R4182 Altered mental status, unspecified: Secondary | ICD-10-CM | POA: Diagnosis not present

## 2017-06-23 DIAGNOSIS — I5032 Chronic diastolic (congestive) heart failure: Secondary | ICD-10-CM | POA: Diagnosis not present

## 2017-06-23 DIAGNOSIS — I130 Hypertensive heart and chronic kidney disease with heart failure and stage 1 through stage 4 chronic kidney disease, or unspecified chronic kidney disease: Secondary | ICD-10-CM | POA: Insufficient documentation

## 2017-06-23 DIAGNOSIS — R400 Somnolence: Secondary | ICD-10-CM | POA: Diagnosis not present

## 2017-06-23 DIAGNOSIS — Z79899 Other long term (current) drug therapy: Secondary | ICD-10-CM | POA: Insufficient documentation

## 2017-06-23 DIAGNOSIS — R410 Disorientation, unspecified: Secondary | ICD-10-CM | POA: Diagnosis not present

## 2017-06-23 DIAGNOSIS — F418 Other specified anxiety disorders: Secondary | ICD-10-CM | POA: Insufficient documentation

## 2017-06-23 DIAGNOSIS — F339 Major depressive disorder, recurrent, unspecified: Secondary | ICD-10-CM | POA: Diagnosis not present

## 2017-06-23 DIAGNOSIS — F333 Major depressive disorder, recurrent, severe with psychotic symptoms: Secondary | ICD-10-CM | POA: Diagnosis present

## 2017-06-23 DIAGNOSIS — N390 Urinary tract infection, site not specified: Secondary | ICD-10-CM | POA: Insufficient documentation

## 2017-06-23 DIAGNOSIS — E039 Hypothyroidism, unspecified: Secondary | ICD-10-CM

## 2017-06-23 DIAGNOSIS — N182 Chronic kidney disease, stage 2 (mild): Secondary | ICD-10-CM | POA: Diagnosis not present

## 2017-06-23 DIAGNOSIS — N189 Chronic kidney disease, unspecified: Secondary | ICD-10-CM | POA: Diagnosis present

## 2017-06-23 DIAGNOSIS — F39 Unspecified mood [affective] disorder: Secondary | ICD-10-CM | POA: Diagnosis present

## 2017-06-23 DIAGNOSIS — N1832 Chronic kidney disease, stage 3b: Secondary | ICD-10-CM | POA: Diagnosis present

## 2017-06-23 DIAGNOSIS — I10 Essential (primary) hypertension: Secondary | ICD-10-CM | POA: Diagnosis not present

## 2017-06-23 DIAGNOSIS — I5030 Unspecified diastolic (congestive) heart failure: Secondary | ICD-10-CM | POA: Diagnosis not present

## 2017-06-23 HISTORY — DX: Altered mental status, unspecified: R41.82

## 2017-06-23 LAB — CBC WITH DIFFERENTIAL/PLATELET
Basophils Absolute: 0.1 10*3/uL (ref 0.0–0.1)
Basophils Relative: 1 %
EOS ABS: 0.3 10*3/uL (ref 0.0–0.7)
EOS PCT: 4 %
HEMATOCRIT: 38.9 % (ref 36.0–46.0)
HEMOGLOBIN: 11.9 g/dL — AB (ref 12.0–15.0)
LYMPHS ABS: 2.2 10*3/uL (ref 0.7–4.0)
LYMPHS PCT: 34 %
MCH: 24.3 pg — ABNORMAL LOW (ref 26.0–34.0)
MCHC: 30.6 g/dL (ref 30.0–36.0)
MCV: 79.4 fL (ref 78.0–100.0)
Monocytes Absolute: 0.5 10*3/uL (ref 0.1–1.0)
Monocytes Relative: 8 %
Neutro Abs: 3.5 10*3/uL (ref 1.7–7.7)
Neutrophils Relative %: 53 %
Platelets: 201 10*3/uL (ref 150–400)
RBC: 4.9 MIL/uL (ref 3.87–5.11)
RDW: 13.9 % (ref 11.5–15.5)
WBC: 6.6 10*3/uL (ref 4.0–10.5)

## 2017-06-23 LAB — URINALYSIS, ROUTINE W REFLEX MICROSCOPIC
BILIRUBIN URINE: NEGATIVE
GLUCOSE, UA: NEGATIVE mg/dL
Hgb urine dipstick: NEGATIVE
Ketones, ur: NEGATIVE mg/dL
Leukocytes, UA: NEGATIVE
Nitrite: NEGATIVE
Protein, ur: NEGATIVE mg/dL
SPECIFIC GRAVITY, URINE: 1.01 (ref 1.005–1.030)
pH: 6 (ref 5.0–8.0)

## 2017-06-23 LAB — COMPREHENSIVE METABOLIC PANEL
ALK PHOS: 63 U/L (ref 38–126)
ALT: 29 U/L (ref 14–54)
AST: 28 U/L (ref 15–41)
Albumin: 3.7 g/dL (ref 3.5–5.0)
Anion gap: 12 (ref 5–15)
BILIRUBIN TOTAL: 0.8 mg/dL (ref 0.3–1.2)
BUN: 14 mg/dL (ref 6–20)
CALCIUM: 9.5 mg/dL (ref 8.9–10.3)
CO2: 28 mmol/L (ref 22–32)
Chloride: 98 mmol/L — ABNORMAL LOW (ref 101–111)
Creatinine, Ser: 1.69 mg/dL — ABNORMAL HIGH (ref 0.44–1.00)
GFR calc Af Amer: 36 mL/min — ABNORMAL LOW (ref >60)
GFR calc non Af Amer: 31 mL/min — ABNORMAL LOW (ref >60)
GLUCOSE: 113 mg/dL — AB (ref 65–99)
Potassium: 3.9 mmol/L (ref 3.5–5.1)
Sodium: 138 mmol/L (ref 135–145)
TOTAL PROTEIN: 6.7 g/dL (ref 6.5–8.1)

## 2017-06-23 LAB — URINE CULTURE

## 2017-06-23 LAB — TSH: TSH: 2.119 u[IU]/mL (ref 0.350–4.500)

## 2017-06-23 MED ORDER — IOPAMIDOL (ISOVUE-370) INJECTION 76%
60.0000 mL | Freq: Once | INTRAVENOUS | Status: AC | PRN
  Administered 2017-06-23: 60 mL via INTRAVENOUS

## 2017-06-23 MED ORDER — ACETAMINOPHEN 650 MG RE SUPP: 650.0000 mg | Freq: Four times a day (QID) | RECTAL | Status: DC | PRN

## 2017-06-23 MED ORDER — SODIUM CHLORIDE 0.9 % IV BOLUS (SEPSIS)
500.0000 mL | Freq: Once | INTRAVENOUS | Status: AC
  Administered 2017-06-23: 500 mL via INTRAVENOUS

## 2017-06-23 MED ORDER — PANTOPRAZOLE SODIUM 40 MG PO TBEC
40.0000 mg | DELAYED_RELEASE_TABLET | Freq: Every day | ORAL | Status: DC
  Administered 2017-06-24: 40 mg via ORAL
  Filled 2017-06-23: qty 1

## 2017-06-23 MED ORDER — ENOXAPARIN SODIUM 40 MG/0.4ML ~~LOC~~ SOLN
40.0000 mg | SUBCUTANEOUS | Status: DC
  Administered 2017-06-23: 40 mg via SUBCUTANEOUS
  Filled 2017-06-23: qty 0.4

## 2017-06-23 MED ORDER — ALPRAZOLAM 0.5 MG PO TABS
0.5000 mg | ORAL_TABLET | Freq: Every day | ORAL | Status: DC
  Administered 2017-06-23: 0.5 mg via ORAL
  Filled 2017-06-23: qty 1

## 2017-06-23 MED ORDER — ONDANSETRON HCL 4 MG/2ML IJ SOLN: 4.0000 mg | Freq: Four times a day (QID) | INTRAMUSCULAR | Status: DC | PRN

## 2017-06-23 MED ORDER — HYDROXYZINE HCL 25 MG PO TABS: 25.0000 mg | ORAL_TABLET | Freq: Three times a day (TID) | ORAL | Status: DC | PRN

## 2017-06-23 MED ORDER — FAMOTIDINE 20 MG PO TABS
20.0000 mg | ORAL_TABLET | Freq: Two times a day (BID) | ORAL | Status: DC
  Administered 2017-06-23 – 2017-06-24 (×2): 20 mg via ORAL
  Filled 2017-06-23 (×2): qty 1

## 2017-06-23 MED ORDER — ONDANSETRON HCL 4 MG PO TABS: 4.0000 mg | ORAL_TABLET | Freq: Four times a day (QID) | ORAL | Status: DC | PRN

## 2017-06-23 MED ORDER — SUCRALFATE 1 G PO TABS
1.0000 g | ORAL_TABLET | Freq: Three times a day (TID) | ORAL | Status: DC
  Administered 2017-06-23 – 2017-06-24 (×3): 1 g via ORAL
  Filled 2017-06-23 (×3): qty 1

## 2017-06-23 MED ORDER — ACETAMINOPHEN 325 MG PO TABS: 650.0000 mg | ORAL_TABLET | Freq: Four times a day (QID) | ORAL | Status: DC | PRN

## 2017-06-23 MED ORDER — LEVOTHYROXINE SODIUM 50 MCG PO TABS
50.0000 ug | ORAL_TABLET | Freq: Every day | ORAL | Status: DC
  Administered 2017-06-24: 50 ug via ORAL
  Filled 2017-06-23: qty 1

## 2017-06-23 MED ORDER — SODIUM CHLORIDE 0.9 % IV SOLN
INTRAVENOUS | Status: DC
  Administered 2017-06-23: 23:00:00 via INTRAVENOUS

## 2017-06-23 MED ORDER — QUETIAPINE FUMARATE 25 MG PO TABS
50.0000 mg | ORAL_TABLET | Freq: Every day | ORAL | Status: DC
  Administered 2017-06-23: 50 mg via ORAL
  Filled 2017-06-23: qty 2

## 2017-06-23 NOTE — Consult Note (Signed)
[facility] Services  Impression: Short term retrograde memory loss, may be transient.  R/O stroke R/O psychtric medication interactions  Recommendations:  #1. Most likely reaction is secondary polypharmacy - pscyh consult and taper - xanex and topiramate decrease long term potention.  #2. MRI brian without contrast if this is negative, inpatient taper off of topiramate and xanex and seroquel.  ---------------------------------------------------------------------  CC: Stroke  History of Present Illness:   Jennifer Davidson is a  62 y.o. with a history of memory loss this morning. She does not recognize her husband today and thinks that infact he is actually her X husband. She states that she is taking topiramte 50 mg daily, it was 100 mg in AM, and 50 mg in the evennn.g  CTA H/N and CT Head is negative. She is also on her xanex, she will take 1.5 as needed. She took topiramate, daloxetine, and cemeditine.   Past Medical History:  Diagnosis Date  . Anxiety   . Cholesterol serum increased   . Chronic back pain    chronic Rt low back pain. s/p L4-5 fusion. failed Rt facet injections. poss due to Rt SI joint dysfunction.  . Chronic kidney disease   . Depression   . Diastolic heart failure (HCC)   . GERD (gastroesophageal reflux disease)   . Hyperparathyroidism (HCC)   . Hypertension   . Hypothyroidism   . Migraine   . Neuromuscular disorder (HCC)   . Sleep apnea   . Tachycardia   . Trochanteric bursitis of both hips 2012   Confirmed on MRI     No current facility-administered medications for this encounter.   Current Outpatient Medications:  .  ALPRAZolam (XANAX) 0.5 MG tablet, Take 1 tablet (0.5 mg total) by mouth at bedtime., Disp: 90 tablet, Rfl: 0 .  azelastine (ASTELIN) 0.1 % nasal spray, Place 1 spray into both nostrils 2 (two) times daily. Use in each nostril as directed, Disp: 30 mL, Rfl: 12 .  Calcium-Magnesium-Vitamin D (CALCIUM 1200+D3  PO), Take 1 tablet by mouth every morning. Contains 800iu Vitamin D3 , Disp: , Rfl:  .  cephALEXin (KEFLEX) 500 MG capsule, Take 1 capsule (500 mg total) by mouth 4 (four) times daily., Disp: 28 capsule, Rfl: 0 .  cholecalciferol (VITAMIN D) 1000 UNITS tablet, Take 1 tablet (1,000 Units total) by mouth daily at 12 noon. (Patient taking differently: Take 1,000 Units by mouth every morning. ), Disp: 30 tablet, Rfl: 0 .  cholestyramine (QUESTRAN) 4 GM/DOSE powder, Take 1 packet (4 g total) by mouth 3 (three) times daily with meals. As needed for diarrhea, Disp: 378 g, Rfl: 2 .  cimetidine (TAGAMET) 400 MG tablet, Take 1 tablet (400 mg total) by mouth 3 (three) times daily., Disp: 270 tablet, Rfl: 1 .  diclofenac sodium (VOLTAREN) 1 % GEL, Apply 2 g topically daily as needed (for knee pain)., Disp: , Rfl:  .  DULoxetine (CYMBALTA) 30 MG capsule, Take 1 capsule (30 mg total) by mouth at bedtime., Disp: 90 capsule, Rfl: 0 .  esomeprazole (NEXIUM) 40 MG capsule, TAKE 1 CAPSULE (40 MG TOTAL) BY MOUTH DAILY., Disp: 90 capsule, Rfl: 3 .  fluticasone (FLONASE) 50 MCG/ACT nasal spray, Place 2 sprays at bedtime into both nostrils., Disp: 48 g, Rfl: 0 .  furosemide (LASIX) 40 MG tablet, Take 1 tablet (40 mg total) by mouth daily. (Patient taking differently: Take 40 mg by mouth every morning. ), Disp: 90 tablet, Rfl: 1 .  gabapentin (NEURONTIN) 400  MG capsule, Take 1 capsule (400 mg total) 4 (four) times daily by mouth., Disp: 360 capsule, Rfl: 1 .  hydrOXYzine (VISTARIL) 100 MG capsule, Take 2 capsules (200 mg total) by mouth at bedtime., Disp: 180 capsule, Rfl: 1 .  levothyroxine (SYNTHROID, LEVOTHROID) 50 MCG tablet, Take 1 tablet (50 mcg total) by mouth daily. (Patient taking differently: Take 50 mcg by mouth every morning. ), Disp: 90 tablet, Rfl: 1 .  ondansetron (ZOFRAN ODT) 4 MG disintegrating tablet, Take 1 tablet (4 mg total) by mouth every 8 (eight) hours as needed., Disp: 10 tablet, Rfl: 0 .  Potassium  Chloride ER 20 MEQ TBCR, Take 1 tablet by mouth daily. With lasix (Patient taking differently: Take 1 tablet by mouth every morning. With lasix), Disp: 90 tablet, Rfl: 1 .  QUEtiapine (SEROQUEL) 50 MG tablet, Take 1 tablet (50 mg total) by mouth at bedtime., Disp: , Rfl:  .  sucralfate (CARAFATE) 1 g tablet, TAKE 1 TABLET FOUR TIMES DAILY WITH MEALS AND AT BEDTIME for heartburn, indigestion, and abdominal pain, Disp: 360 tablet, Rfl: 1 .  topiramate (TOPAMAX) 50 MG tablet, Take 1 tablet (50 mg total) by mouth at bedtime., Disp: 90 tablet, Rfl: 0 .  triamcinolone cream (KENALOG) 0.1 %, Apply 1 application topically 4 (four) times daily. For itching rash, Disp: 453.6 g, Rfl: 0  Diagnostic Testing:  Vitals:   06/23/17 1630 06/23/17 1700 06/23/17 1730 06/23/17 1800  BP: 112/64 (!) 104/56 121/71 111/66  Pulse: 77 76 79 80  Resp: 11 19 17 15   Temp:      TempSrc:      SpO2: 94% 93% 93% 91%  Weight:      Height:       Vital Signs:    Exam:  Mental Status:  Awake, alert, oriented  Naming: Intact Repetition: Intact   Speech: fluent  Cranial Nerves:  Pupils: Equal round and reactive to light Extraocular movements: Intact in all cardinal gaze Ptosis: Absent Visual fields: Intact to finger counting Facial sensation: Intact to pin and light touch Facial movements: Intact and symmetric    Motor Exam:  No drift   Tremor/Abnormal Movements:  Resting tremor: Absent Intention tremor: Absent Postural tremor: Absent  Sensory Exam:   Light touch: Intact Pinprick: Intact    Coordination:   Finger to nose: Intact Heel to shin: Intact  Medical Decision Making:  - Extensive number of diagnosis or management options are considered above.   - Extensive amount of complex data reviewed.   - High risk of complication and/or morbidity or mortality are associated with differential diagnostic considerations above.  - There may be uncertain outcome and increased probability of prolonged  functional impairment or high probability of severe prolonged functional impairment associated with some of these differential diagnosis.   Medical Data Reviewed:  1.Data reviewed include clinical labs, radiology,  Medical Tests;   2.Tests r esults discussed w/performing or interpreting physician;   3.Obtaining/reviewing old medical records;  4.Obtaining case history from another source;  5.Independent review of image, tracing or specimen.

## 2017-06-23 NOTE — ED Notes (Signed)
Pt given dinner meal tray

## 2017-06-23 NOTE — ED Notes (Signed)
Nurse from [facility] Dr. Sherryll BurgerShah called to report pt was recently treated for thought UTI but culture came back negative.

## 2017-06-23 NOTE — ED Triage Notes (Signed)
Patient woke this morning per husband and was confused. Patient does not recognize husband. Patient reports some weakness on right side and intermittent dizziness. Patient went to sleep at 10:30, wake up this morning at 6:30. Patient recently being treated with antibiotics for urinary tract infection-seen here in ED on Tuesday. EDP made aware.

## 2017-06-23 NOTE — ED Provider Notes (Signed)
[facility] EMERGENCY DEPARTMENT Provider Note   CSN: [id number] Arrival date & time: 06/23/17  1109     History   Chief Complaint Chief Complaint  Patient presents with  . Altered Mental Status    HPI Jennifer Davidson is a 62 y.o. female.  Patient was brought to the emergency department by her husband because when she awoke today she was confused and she did not recognize him.  She actually thought he was her previous husband   The history is provided by the patient. No language interpreter was used.  Altered Mental Status   This is a new problem. The current episode started 12 to 24 hours ago. The problem has not changed since onset.Associated symptoms include confusion. Pertinent negatives include no seizures and no hallucinations. Risk factors include the patient not taking medications correctly. Her past medical history does not include seizures.    Past Medical History:  Diagnosis Date  . Anxiety   . Cholesterol serum increased   . Chronic back pain    chronic Rt low back pain. s/p L4-5 fusion. failed Rt facet injections. poss due to Rt SI joint dysfunction.  . Chronic kidney disease   . Depression   . Diastolic heart failure (HCC)   . GERD (gastroesophageal reflux disease)   . Hyperparathyroidism (HCC)   . Hypertension   . Hypothyroidism   . Migraine   . Neuromuscular disorder (HCC)   . Sleep apnea   . Tachycardia   . Trochanteric bursitis of both hips 2012   Confirmed on MRI    Patient Active Problem List   Diagnosis Date Noted  . Irritable bowel syndrome with diarrhea 04/25/2017  . Sacroiliac joint dysfunction of right side 03/17/2017  . 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  . Seizure-like activity (HCC) 09/14/2016  . Moderate episode of recurrent major depressive disorder (HCC) 08/31/2016  . PTSD (post-traumatic stress disorder) 08/31/2016  . UTI (urinary tract infection) 06/25/2016  . Acute encephalopathy 06/25/2016  . Severe  recurrent depression with psychosis (HCC) 06/25/2016  . Acute gastroenteritis 06/23/2016  . GERD (gastroesophageal reflux disease) 11/13/2015  . Syncope 08/30/2015  . Generalized anxiety disorder 06/14/2015  . Chronic pain of right lower extremity 05/14/2015  . Neuropathy of lower extremity 05/14/2015  . Headache, migraine 01/17/2015  . Severe recurrent major depressive disorder with psychotic features (HCC)   . Major depression, recurrent, chronic (HCC) 12/02/2014  . Mood disorder (HCC) 12/01/2014  . Chronic back pain   . Hyperparathyroidism, 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  . Neuromuscular disorder (HCC) 07/31/2014  . Lower back pain 09/21/2013  . Diastolic heart failure (HCC) 03/17/2012  . Syncope and collapse 02/12/2012  . Hypertension 02/04/2012  . Chronic kidney disease 02/04/2012  . Edema 02/04/2012  . Tachycardia 02/04/2012  . Encounter for long-term (current) use of medications 02/04/2012  . Anxiety and depression 02/04/2012  . Hypothyroid 02/04/2012  . Hyperlipidemia 02/04/2012    Past Surgical History:  Procedure Laterality Date  . ABDOMINAL HYSTERECTOMY    . APPENDECTOMY    . BACK SURGERY     L4-5 fusion  . KNEE SURGERY      OB History    No data available       Home Medications    Prior to Admission medications   Medication Sig Start Date End Date Taking? Authorizing Provider  ALPRAZolam Prudy Feeler) 0.5 MG tablet Take 1 tablet (0.5 mg total) by mouth at bedtime. 04/25/17  Yes [name], MD  azelastine (ASTELIN)  0.1 % nasal spray Place 1 spray into both nostrils 2 (two) times daily. Use in each nostril as directed 09/09/16  Yes [name], MD  Calcium-Magnesium-Vitamin D (CALCIUM 1200+D3 PO) Take 1 tablet by mouth every morning. Contains 800iu Vitamin D3    Yes [provider]  cephALEXin (KEFLEX) 500 MG capsule Take 1 capsule (500 mg total) by mouth 4 (four) times daily. 06/21/17  Yes Jacalyn Lefevre, MD  cholecalciferol (VITAMIN D) 1000 UNITS  tablet Take 1 tablet (1,000 Units total) by mouth daily at 12 noon. Patient taking differently: Take 1,000 Units by mouth every morning.  12/05/14  Yes Adonis Brook, NP  cholestyramine Lanetta Inch) 4 GM/DOSE powder Take 1 packet (4 g total) by mouth 3 (three) times daily with meals. As needed for diarrhea 04/25/17  Yes [name], MD  cimetidine (TAGAMET) 400 MG tablet Take 1 tablet (400 mg total) by mouth 3 (three) times daily. 04/18/17  Yes [name], MD  diclofenac sodium (VOLTAREN) 1 % GEL Apply 2 g topically daily as needed (for knee pain).   Yes [provider]  DULoxetine (CYMBALTA) 30 MG capsule Take 1 capsule (30 mg total) by mouth at bedtime. 04/25/17  Yes [name], MD  esomeprazole (NEXIUM) 40 MG capsule TAKE 1 CAPSULE (40 MG TOTAL) BY MOUTH DAILY. 12/09/16  Yes [name], MD  fluticasone (FLONASE) 50 MCG/ACT nasal spray Place 2 sprays at bedtime into both nostrils. 03/25/17  Yes [name], MD  furosemide (LASIX) 40 MG tablet Take 1 tablet (40 mg total) by mouth daily. Patient taking differently: Take 40 mg by mouth every morning.  12/09/16  Yes [name], MD  gabapentin (NEURONTIN) 400 MG capsule Take 1 capsule (400 mg total) 4 (four) times daily by mouth. 03/29/17  Yes [name], MD  hydrOXYzine (VISTARIL) 100 MG capsule Take 2 capsules (200 mg total) by mouth at bedtime. 06/03/17  Yes [name], MD  levothyroxine (SYNTHROID, LEVOTHROID) 50 MCG tablet Take 1 tablet (50 mcg total) by mouth daily. Patient taking differently: Take 50 mcg by mouth every morning.  04/25/17  Yes [name], MD  ondansetron (ZOFRAN ODT) 4 MG disintegrating tablet Take 1 tablet (4 mg total) by mouth every 8 (eight) hours as needed. 06/21/17  Yes Jacalyn Lefevre, MD  Potassium Chloride ER 20 MEQ TBCR Take 1 tablet by mouth daily. With lasix Patient taking differently: Take 1 tablet by mouth every morning. With lasix 12/09/16  Yes [name], MD  QUEtiapine (SEROQUEL) 50 MG tablet Take 1 tablet  (50 mg total) by mouth at bedtime. 04/10/17  Yes Kathlen Mody, MD  sucralfate (CARAFATE) 1 g tablet TAKE 1 TABLET FOUR TIMES DAILY WITH MEALS AND AT BEDTIME for heartburn, indigestion, and abdominal pain 02/19/17  Yes [name], MD  topiramate (TOPAMAX) 50 MG tablet Take 1 tablet (50 mg total) by mouth at bedtime. 04/25/17  Yes [name], MD  triamcinolone cream (KENALOG) 0.1 % Apply 1 application topically 4 (four) times daily. For itching rash 12/16/16  Yes [name], MD    Family History Family History  Problem Relation Age of Onset  . Diabetes Mother   . Heart disease Mother   . Kidney disease Mother   . Thyroid disease Mother   . Heart disease Father   . Seizures Father   . COPD Father   . Cancer Maternal Grandmother     Social History Social History  Tobacco Use  . Smoking status: Never Smoker  . Smokeless tobacco: Never Used  Substance Use Topics  . Alcohol use: No  . Drug use: No    Comment: 08-31-2016 PER PT NO      Allergies   Tramadol and Trazodone and nefazodone   Review of Systems Review of Systems  Constitutional: Negative for appetite change and fatigue.  HENT: Negative for congestion, ear discharge and sinus pressure.   Eyes: Negative for discharge.  Respiratory: Negative for cough.   Cardiovascular: Negative for chest pain.  Gastrointestinal: Negative for abdominal pain and diarrhea.  Genitourinary: Negative for frequency and hematuria.  Musculoskeletal: Negative for back pain.  Skin: Negative for rash.  Neurological: Negative for seizures and headaches.  Psychiatric/Behavioral: Positive for confusion. Negative for hallucinations.     Physical Exam Updated Vital Signs BP 122/72   Pulse 71   Temp 98.8 F (37.1 C) (Oral)   Resp 13   Ht 5\' 3"  (1.6 m)   Wt 96.6 kg (213 lb)   SpO2 98%   BMI 37.73 kg/m   Physical Exam  Constitutional: She is oriented to person, place, and time. She appears well-developed.  HENT:  Head: Normocephalic.    Eyes: Conjunctivae and EOM are normal. No scleral icterus.  Neck: Neck supple. No thyromegaly present.  Cardiovascular: Normal rate and regular rhythm. Exam reveals no gallop and no friction rub.  No murmur heard. Pulmonary/Chest: No stridor. She has no wheezes. She has no rales. She exhibits no tenderness.  Abdominal: She exhibits no distension. There is no tenderness. There is no rebound.  Musculoskeletal: Normal range of motion. She exhibits no edema.  Lymphadenopathy:    She has no cervical adenopathy.  Neurological: She is oriented to person, place, and time. She exhibits normal muscle tone. Coordination normal.  Patient is mildly lethargic but answers all questions appropriately and is oriented by 3 but does not recognize her husband  Skin: No rash noted. No erythema.  Psychiatric: She has a normal mood and affect. Her behavior is normal.     ED Treatments / Results  Labs (all labs ordered are listed, but only abnormal results are displayed) Labs Reviewed  CBC WITH DIFFERENTIAL/PLATELET - Abnormal; Notable for the following components:      Result Value   Hemoglobin 11.9 (*)    MCH 24.3 (*)    All other components within normal limits  COMPREHENSIVE METABOLIC PANEL - Abnormal; Notable for the following components:   Chloride 98 (*)    Glucose, Bld 113 (*)    Creatinine, Ser 1.69 (*)    GFR calc non Af Amer 31 (*)    GFR calc Af Amer 36 (*)    All other components within normal limits  URINALYSIS, ROUTINE W REFLEX MICROSCOPIC    EKG  EKG Interpretation  Date/Time:  Thursday June 23 2017 11:19:57 EST Ventricular Rate:  91 PR Interval:    QRS Duration: 96 QT Interval:  397 QTC Calculation: 489 R Axis:   41 Text Interpretation:  Sinus rhythm Low voltage, precordial leads Borderline T abnormalities, anterior leads Borderline prolonged QT interval Confirmed by Bethann BerkshireZammit, Jari Carollo [id number]) on 06/23/2017 7:40:41 PM       Radiology Ct Angio Head W Or Wo  Contrast  Result Date: 06/23/2017 CLINICAL DATA:  Confusion, beginning earlier today. EXAM: CT ANGIOGRAPHY HEAD AND NECK TECHNIQUE: Multidetector CT imaging of the head and neck was performed using the standard protocol during bolus administration of intravenous contrast. Multiplanar CT image reconstructions  and MIPs were obtained to evaluate the vascular anatomy. Carotid stenosis measurements (when applicable) are obtained utilizing NASCET criteria, using the distal internal carotid diameter as the denominator. CONTRAST:  60mL ISOVUE-370 IOPAMIDOL (ISOVUE-370) INJECTION 76% COMPARISON:  MR head 08/09/2016 was unremarkable. FINDINGS: Brain: No evidence of acute infarction, hemorrhage, hydrocephalus, extra-axial collection or mass lesion/mass effect. Vascular: Reported separately, see below. Skull: Normal. Negative for fracture or focal lesion. Sinuses: Imaged portions are clear. Orbits: No acute finding. CTA NECK FINDINGS Aortic arch: Standard branching. Imaged portion shows no evidence of aneurysm or dissection. No significant stenosis of the major arch vessel origins. Right carotid system: Minor atheromatous change. No evidence of dissection, stenosis (50% or greater) or occlusion. Left carotid system: Minor atheromatous change. No evidence of dissection, stenosis (50% or greater) or occlusion. Vertebral arteries: Codominant. No evidence of dissection, stenosis (50% or greater) or occlusion. Skeleton: Spondylosis. Other neck: No masses. Upper chest: Mild vascular congestion in the lungs. No consolidation or pneumothorax. Review of the MIP images confirms the above findings CTA HEAD FINDINGS Anterior circulation: No significant stenosis, proximal occlusion, aneurysm, or vascular malformation. Posterior circulation: No significant stenosis, proximal occlusion, aneurysm, or vascular malformation. Venous sinuses: As permitted by contrast timing, patent. Anatomic variants: None of significance. Delayed phase: No  abnormal intracranial enhancement. Review of the MIP images confirms the above findings IMPRESSION: Minor atheromatous change at the carotid bifurcations. No flow-limiting extracranial or intracranial stenosis or dissection. No acute intracranial findings. No abnormal postcontrast enhancement. Electronically Signed   By: Elsie Stain M.D.   On: 06/23/2017 16:44   Ct Angio Neck W And/or Wo Contrast  Result Date: 06/23/2017 CLINICAL DATA:  Confusion, beginning earlier today. EXAM: CT ANGIOGRAPHY HEAD AND NECK TECHNIQUE: Multidetector CT imaging of the head and neck was performed using the standard protocol during bolus administration of intravenous contrast. Multiplanar CT image reconstructions and MIPs were obtained to evaluate the vascular anatomy. Carotid stenosis measurements (when applicable) are obtained utilizing NASCET criteria, using the distal internal carotid diameter as the denominator. CONTRAST:  60mL ISOVUE-370 IOPAMIDOL (ISOVUE-370) INJECTION 76% COMPARISON:  MR head 08/09/2016 was unremarkable. FINDINGS: Brain: No evidence of acute infarction, hemorrhage, hydrocephalus, extra-axial collection or mass lesion/mass effect. Vascular: Reported separately, see below. Skull: Normal. Negative for fracture or focal lesion. Sinuses: Imaged portions are clear. Orbits: No acute finding. CTA NECK FINDINGS Aortic arch: Standard branching. Imaged portion shows no evidence of aneurysm or dissection. No significant stenosis of the major arch vessel origins. Right carotid system: Minor atheromatous change. No evidence of dissection, stenosis (50% or greater) or occlusion. Left carotid system: Minor atheromatous change. No evidence of dissection, stenosis (50% or greater) or occlusion. Vertebral arteries: Codominant. No evidence of dissection, stenosis (50% or greater) or occlusion. Skeleton: Spondylosis. Other neck: No masses. Upper chest: Mild vascular congestion in the lungs. No consolidation or pneumothorax.  Review of the MIP images confirms the above findings CTA HEAD FINDINGS Anterior circulation: No significant stenosis, proximal occlusion, aneurysm, or vascular malformation. Posterior circulation: No significant stenosis, proximal occlusion, aneurysm, or vascular malformation. Venous sinuses: As permitted by contrast timing, patent. Anatomic variants: None of significance. Delayed phase: No abnormal intracranial enhancement. Review of the MIP images confirms the above findings IMPRESSION: Minor atheromatous change at the carotid bifurcations. No flow-limiting extracranial or intracranial stenosis or dissection. No acute intracranial findings. No abnormal postcontrast enhancement. Electronically Signed   By: Elsie Stain M.D.   On: 06/23/2017 16:44    Procedures Procedures (including critical care time)  Medications  Ordered in ED Medications  sodium chloride 0.9 % bolus 500 mL (0 mLs Intravenous Stopped 06/23/17 1807)  iopamidol (ISOVUE-370) 76 % injection 60 mL (60 mLs Intravenous Contrast Given 06/23/17 1603)     Initial Impression / Assessment and Plan / ED Course  I have reviewed the triage vital signs and the nursing notes.  Pertinent labs & imaging results that were available during my care of the patient were reviewed by me and considered in my medical decision making (see chart for details).     CT of the head neck unremarkable labs unremarkable.  Patient was seen by neurology who felt like she needed to be admitted to the hospital and get an MRI of her head.  If the MRI is negative the neurologist made recommendations on medical thin changes he suspects this is related to her medicines  Final Clinical Impressions(s) / ED Diagnoses   Final diagnoses:  Somnolence    ED Discharge Orders    None       Bethann Berkshire, MD 06/23/17 1942

## 2017-06-23 NOTE — Progress Notes (Deleted)
   [facility]  Impression:   Recommendations:   ---------------------------------------------------------------------  CC:  History of Present Illness:    Diagnostic Testing:  Vital Signs:    Exam:  Mental Status:  Awake, alert, oriented  Naming: Intact Repetition: Intact   Speech: fluent  Cranial Nerves:  Pupils: Equal round and reactive to light Extraocular movements: Intact in all cardinal gaze Ptosis: Absent Visual fields: Intact to finger counting Facial sensation: Intact to pin and light touch Facial movements: Intact and symmetric    Motor Exam:  No drift   Tremor/Abnormal Movements:  Resting tremor: Absent Intention tremor: Absent Postural tremor: Absent  Sensory Exam:   Light touch: Intact Pinprick: Intact    Coordination:   Finger to nose: Intact Heel to shin: Intact  Medical Decision Making:  - Extensive number of diagnosis or management options are considered above.   - Extensive amount of complex data reviewed.   - High risk of complication and/or morbidity or mortality are associated with differential diagnostic considerations above.  - There may be uncertain outcome and increased probability of prolonged functional impairment or high probability of severe prolonged functional impairment associated with some of these differential diagnosis.   Medical Data Reviewed:  1.Data reviewed include clinical labs, radiology,  Medical Tests;   2.Tests r esults discussed w/performing or interpreting physician;   3.Obtaining/reviewing old medical records;  4.Obtaining case history from another source;  5.Independent review of image, tracing or specimen.

## 2017-06-23 NOTE — H&P (Signed)
TRH H&P    Patient Demographics:    Jennifer Davidson, is a 62 y.o. female  MRN: [id number]  DOB - [birth year]  Admit Date - 06/23/2017  Referring MD/NP/PA: Dr. Estell Harpin  Outpatient Primary MD for the patient is [name], MD  Patient coming from: home  Chief complaint-altered mental status   HPI:    Jennifer Davidson  is a 62 y.o. female, with history of chronic back pain, depression, hypothyroidism, hypertension, anxiety who was brought to the ED as patient was confused this morning. Patient was seen in the ED on Tuesday at that time she was diagnosed with UTI and discharged on Keflex. Patient was lethargic required IV fluids on that day. As per husband patient was doing fine with no complaints. No chest pain or shortness of breath. No nausea vomiting or diarrhea. Denies dysuria. Urine culture from February 5 only grew multiple species  When patient woke up this morning she was unable to recognize her husband, she was brought to hospital and continue to be confused until around 7:30 PM. Neurology was consulted. CT had was negative for stroke. Neurologist, tele recommended, MRI brain a.m. to rule out stroke and adjustment of psychiatric medications. Patient has been believes that patient is on too much strong psychiatric medications which was changed in November of last year and also by her PCP.  Patient is currently on following psychotropic medications at home 1. Xanax .5 mg QHS 2. Cymbalta 30 mg QHS 3. Neurontin 400 mg four times a day 4. Hydroxyzine 200 mg daily at bedtime 5. Seroquel 50 mg at bedtime 6. Topamax 50 mg at bedtime    Review of systems:      All other systems reviewed and are negative.   With Past History of the following :    Past Medical History:  Diagnosis Date  . Anxiety   . Cholesterol serum increased   . Chronic back pain    chronic Rt low back pain. s/p L4-5 fusion. failed Rt facet  injections. poss due to Rt SI joint dysfunction.  . Chronic kidney disease   . Depression   . Diastolic heart failure (HCC)   . GERD (gastroesophageal reflux disease)   . Hyperparathyroidism (HCC)   . Hypertension   . Hypothyroidism   . Migraine   . Neuromuscular disorder (HCC)   . Sleep apnea   . Tachycardia   . Trochanteric bursitis of both hips 2012   Confirmed on MRI      Past Surgical History:  Procedure Laterality Date  . ABDOMINAL HYSTERECTOMY    . APPENDECTOMY    . BACK SURGERY     L4-5 fusion  . KNEE SURGERY        Social History:      Social History   Tobacco Use  . Smoking status: Never Smoker  . Smokeless tobacco: Never Used  Substance Use Topics  . Alcohol use: No       Family History :     Family History  Problem Relation Age of Onset  . Diabetes  Mother   . Heart disease Mother   . Kidney disease Mother   . Thyroid disease Mother   . Heart disease Father   . Seizures Father   . COPD Father   . Cancer Maternal Grandmother       Home Medications:   Prior to Admission medications   Medication Sig Start Date End Date Taking? Authorizing Provider  ALPRAZolam Prudy Feeler) 0.5 MG tablet Take 1 tablet (0.5 mg total) by mouth at bedtime. 04/25/17  Yes [name], MD  azelastine (ASTELIN) 0.1 % nasal spray Place 1 spray into both nostrils 2 (two) times daily. Use in each nostril as directed 09/09/16  Yes [name], MD  Calcium-Magnesium-Vitamin D (CALCIUM 1200+D3 PO) Take 1 tablet by mouth every morning. Contains 800iu Vitamin D3    Yes [provider]  cephALEXin (KEFLEX) 500 MG capsule Take 1 capsule (500 mg total) by mouth 4 (four) times daily. 06/21/17  Yes Jacalyn Lefevre, MD  cholecalciferol (VITAMIN D) 1000 UNITS tablet Take 1 tablet (1,000 Units total) by mouth daily at 12 noon. Patient taking differently: Take 1,000 Units by mouth every morning.  12/05/14  Yes Adonis Brook, NP  cholestyramine Lanetta Inch) 4 GM/DOSE powder Take 1  packet (4 g total) by mouth 3 (three) times daily with meals. As needed for diarrhea 04/25/17  Yes [name], MD  cimetidine (TAGAMET) 400 MG tablet Take 1 tablet (400 mg total) by mouth 3 (three) times daily. 04/18/17  Yes [name], MD  diclofenac sodium (VOLTAREN) 1 % GEL Apply 2 g topically daily as needed (for knee pain).   Yes [provider]  DULoxetine (CYMBALTA) 30 MG capsule Take 1 capsule (30 mg total) by mouth at bedtime. 04/25/17  Yes [name], MD  esomeprazole (NEXIUM) 40 MG capsule TAKE 1 CAPSULE (40 MG TOTAL) BY MOUTH DAILY. 12/09/16  Yes [name], MD  fluticasone (FLONASE) 50 MCG/ACT nasal spray Place 2 sprays at bedtime into both nostrils. 03/25/17  Yes [name], MD  furosemide (LASIX) 40 MG tablet Take 1 tablet (40 mg total) by mouth daily. Patient taking differently: Take 40 mg by mouth every morning.  12/09/16  Yes [name], MD  gabapentin (NEURONTIN) 400 MG capsule Take 1 capsule (400 mg total) 4 (four) times daily by mouth. 03/29/17  Yes [name], MD  hydrOXYzine (VISTARIL) 100 MG capsule Take 2 capsules (200 mg total) by mouth at bedtime. 06/03/17  Yes [name], MD  levothyroxine (SYNTHROID, LEVOTHROID) 50 MCG tablet Take 1 tablet (50 mcg total) by mouth daily. Patient taking differently: Take 50 mcg by mouth every morning.  04/25/17  Yes [name], MD  ondansetron (ZOFRAN ODT) 4 MG disintegrating tablet Take 1 tablet (4 mg total) by mouth every 8 (eight) hours as needed. 06/21/17  Yes Jacalyn Lefevre, MD  Potassium Chloride ER 20 MEQ TBCR Take 1 tablet by mouth daily. With lasix Patient taking differently: Take 1 tablet by mouth every morning. With lasix 12/09/16  Yes [name], MD  QUEtiapine (SEROQUEL) 50 MG tablet Take 1 tablet (50 mg total) by mouth at bedtime. 04/10/17  Yes Kathlen Mody, MD  sucralfate (CARAFATE) 1 g tablet TAKE 1 TABLET FOUR TIMES DAILY WITH MEALS AND AT BEDTIME for heartburn, indigestion, and abdominal pain 02/19/17  Yes  [name], MD  topiramate (TOPAMAX) 50 MG tablet Take 1 tablet (50 mg total) by mouth at bedtime. 04/25/17  Yes [name],  [name], MD  triamcinolone cream (KENALOG) 0.1 % Apply 1 application topically 4 (four) times daily. For itching rash 12/16/16  Yes [name], MD     Allergies:     Allergies  Allergen Reactions  . Tramadol Nausea And Vomiting  . Trazodone And Nefazodone Other (See Comments)    Per pt trazodone caused hallucinations and behavior changes      Physical Exam:   Vitals  Blood pressure (!) 107/46, pulse 91, temperature 98.8 F (37.1 C), temperature source Oral, resp. rate 14, height 5\' 3"  (1.6 m), weight 96.6 kg (213 lb), SpO2 94 %.  1.  General: Appears in no acute distress  2. Psychiatric:  Intact judgement and  insight, awake alert, oriented x 3.  3. Neurologic: No focal neurological deficits, all cranial nerves intact.Strength 5/5 all 4 extremities, sensation intact all 4 extremities, plantars down going.  4. Eyes :  anicteric sclerae, moist conjunctivae with no lid lag. PERRLA.  5. ENMT:  Oropharynx clear with moist mucous membranes and good dentition  6. Neck:  supple, no cervical lymphadenopathy appriciated, No thyromegaly  7. Respiratory : Normal respiratory effort, good air movement bilaterally,clear to  auscultation bilaterally  8. Cardiovascular : RRR, no gallops, rubs or murmurs, no leg edema  9. Gastrointestinal:  Positive bowel sounds, abdomen soft, non-tender to palpation,no hepatosplenomegaly, no rigidity or guarding       10. Skin:  No cyanosis, normal texture and turgor, no rash, lesions or ulcers  11.Musculoskeletal:  Good muscle tone,  joints appear normal , no effusions,  normal range of motion    Data Review:    CBC Recent Labs  Lab 06/21/17 1055 06/23/17 1240  WBC 8.9 6.6  HGB 13.2 11.9*  HCT 41.3 38.9  PLT 264 201  MCV 77.1* 79.4  MCH 24.6* 24.3*  MCHC 32.0 30.6  RDW 14.2 13.9  LYMPHSABS 2.8 2.2  MONOABS 0.8  0.5  EOSABS 0.3 0.3  BASOSABS 0.0 0.1   ------------------------------------------------------------------------------------------------------------------  Chemistries  Recent Labs  Lab 06/21/17 1055 06/23/17 1240  NA 137 138  K 3.9 3.9  CL 102 98*  CO2 23 28  GLUCOSE 117* 113*  BUN 15 14  CREATININE 1.80* 1.69*  CALCIUM 9.8 9.5  AST 34 28  ALT 30 29  ALKPHOS 77 63  BILITOT 0.5 0.8   ------------------------------------------------------------------------------------------------------------------  ------------------------------------------------------------------------------------------------------------------ GFR: Estimated Creatinine Clearance: 37.7 mL/min (A) (by C-G formula based on SCr of 1.69 mg/dL (H)). Liver Function Tests: Recent Labs  Lab 06/21/17 1055 06/23/17 1240  AST 34 28  ALT 30 29  ALKPHOS 77 63  BILITOT 0.5 0.8  PROT 7.4 6.7  ALBUMIN 4.2 3.7    --------------------------------------------------------------------------------------------------------------- Urine analysis:    Component Value Date/Time   COLORURINE YELLOW 06/23/2017 1133   APPEARANCEUR CLEAR 06/23/2017 1133   LABSPEC 1.010 06/23/2017 1133   PHURINE 6.0 06/23/2017 1133   GLUCOSEU NEGATIVE 06/23/2017 1133   HGBUR NEGATIVE 06/23/2017 1133   BILIRUBINUR NEGATIVE 06/23/2017 1133   BILIRUBINUR negative 04/18/2017 1225   BILIRUBINUR neg 12/15/2014 1059   KETONESUR NEGATIVE 06/23/2017 1133   PROTEINUR NEGATIVE 06/23/2017 1133   UROBILINOGEN 0.2 04/18/2017 1225   UROBILINOGEN 1.0 12/03/2014 0648   NITRITE NEGATIVE 06/23/2017 1133   LEUKOCYTESUR NEGATIVE 06/23/2017 1133      Imaging Results:    Ct Angio Head W Or Wo Contrast  Result Date: 06/23/2017 CLINICAL DATA:  Confusion, beginning earlier today. EXAM: CT ANGIOGRAPHY HEAD AND NECK TECHNIQUE: Multidetector CT imaging of the  head and neck was performed using the standard protocol during bolus administration of intravenous  contrast. Multiplanar CT image reconstructions and MIPs were obtained to evaluate the vascular anatomy. Carotid stenosis measurements (when applicable) are obtained utilizing NASCET criteria, using the distal internal carotid diameter as the denominator. CONTRAST:  60mL ISOVUE-370 IOPAMIDOL (ISOVUE-370) INJECTION 76% COMPARISON:  MR head 08/09/2016 was unremarkable. FINDINGS: Brain: No evidence of acute infarction, hemorrhage, hydrocephalus, extra-axial collection or mass lesion/mass effect. Vascular: Reported separately, see below. Skull: Normal. Negative for fracture or focal lesion. Sinuses: Imaged portions are clear. Orbits: No acute finding. CTA NECK FINDINGS Aortic arch: Standard branching. Imaged portion shows no evidence of aneurysm or dissection. No significant stenosis of the major arch vessel origins. Right carotid system: Minor atheromatous change. No evidence of dissection, stenosis (50% or greater) or occlusion. Left carotid system: Minor atheromatous change. No evidence of dissection, stenosis (50% or greater) or occlusion. Vertebral arteries: Codominant. No evidence of dissection, stenosis (50% or greater) or occlusion. Skeleton: Spondylosis. Other neck: No masses. Upper chest: Mild vascular congestion in the lungs. No consolidation or pneumothorax. Review of the MIP images confirms the above findings CTA HEAD FINDINGS Anterior circulation: No significant stenosis, proximal occlusion, aneurysm, or vascular malformation. Posterior circulation: No significant stenosis, proximal occlusion, aneurysm, or vascular malformation. Venous sinuses: As permitted by contrast timing, patent. Anatomic variants: None of significance. Delayed phase: No abnormal intracranial enhancement. Review of the MIP images confirms the above findings IMPRESSION: Minor atheromatous change at the carotid bifurcations. No flow-limiting extracranial or intracranial stenosis or dissection. No acute intracranial findings. No abnormal  postcontrast enhancement. Electronically Signed   By: Elsie StainJohn T Curnes M.D.   On: 06/23/2017 16:44   Ct Angio Neck W And/or Wo Contrast  Result Date: 06/23/2017 CLINICAL DATA:  Confusion, beginning earlier today. EXAM: CT ANGIOGRAPHY HEAD AND NECK TECHNIQUE: Multidetector CT imaging of the head and neck was performed using the standard protocol during bolus administration of intravenous contrast. Multiplanar CT image reconstructions and MIPs were obtained to evaluate the vascular anatomy. Carotid stenosis measurements (when applicable) are obtained utilizing NASCET criteria, using the distal internal carotid diameter as the denominator. CONTRAST:  60mL ISOVUE-370 IOPAMIDOL (ISOVUE-370) INJECTION 76% COMPARISON:  MR head 08/09/2016 was unremarkable. FINDINGS: Brain: No evidence of acute infarction, hemorrhage, hydrocephalus, extra-axial collection or mass lesion/mass effect. Vascular: Reported separately, see below. Skull: Normal. Negative for fracture or focal lesion. Sinuses: Imaged portions are clear. Orbits: No acute finding. CTA NECK FINDINGS Aortic arch: Standard branching. Imaged portion shows no evidence of aneurysm or dissection. No significant stenosis of the major arch vessel origins. Right carotid system: Minor atheromatous change. No evidence of dissection, stenosis (50% or greater) or occlusion. Left carotid system: Minor atheromatous change. No evidence of dissection, stenosis (50% or greater) or occlusion. Vertebral arteries: Codominant. No evidence of dissection, stenosis (50% or greater) or occlusion. Skeleton: Spondylosis. Other neck: No masses. Upper chest: Mild vascular congestion in the lungs. No consolidation or pneumothorax. Review of the MIP images confirms the above findings CTA HEAD FINDINGS Anterior circulation: No significant stenosis, proximal occlusion, aneurysm, or vascular malformation. Posterior circulation: No significant stenosis, proximal occlusion, aneurysm, or vascular  malformation. Venous sinuses: As permitted by contrast timing, patent. Anatomic variants: None of significance. Delayed phase: No abnormal intracranial enhancement. Review of the MIP images confirms the above findings IMPRESSION: Minor atheromatous change at the carotid bifurcations. No flow-limiting extracranial or intracranial stenosis or dissection. No acute intracranial findings. No abnormal postcontrast enhancement. Electronically Signed   By:  Elsie Stain M.D.   On: 06/23/2017 16:44    My personal review of EKG: Rhythm NSR   Assessment & Plan:    Active Problems:   Altered mental status   1. Altered mental status- likely from polypharmacy, psychotropic medications. Resolved at this time. Patient is back to baseline. Will change the dose of hydroxyzine 25 mg PO TID PRN, continue Seroquel 50 mg QHS, Xanax 0.5 mg QHS, Neurontin 400 mg four times a day, hold Topamax, Cymbalta. Patient will need adjustment of the medications before discharge. Consider psychiatric evaluation in a.m. CT head is negative for stroke. CTA head and neck is negative for acute abnormality, will obtain MRI in a.m. 2. Hypothyroidism- continue Synthroid, check TSH 3. Depression-hold Cymbalta at this time, if patient mental status remains clear in the morning consider starting Cymbalta in a.m. 4. Abnormal UA- patient was diagnosed with UTI on February 5 and given cephalexin. Urine culture only growing multiple species. Will discontinue antibiotics. 5. Hypertension/ ? Chronic diastolic CHF - patient is on Lasix 40 mg daily, echocardiogram from April 2017 showed EF 60%. Will hold Lasix at this time. 6. Chronic kidney disease stage II-creatinine 1.69, at baseline.    DVT Prophylaxis-   Lovenox  AM Labs Ordered, also please review Full Orders  Family Communication: Admission, patients condition and plan of care including tests being ordered have been discussed with the patient and  who indicate understanding and agree with  the plan and Code Status.  Code Status: full code  Admission status: observation  Time spent in minutes : 60 minutes   Meredeth Ide M.D on 06/23/2017 at 9:21 PM  Between 7am to 7pm - Pager - [phone]. After 7pm go to [email or web address] - password [facility]  [facility] - Office  [phone]

## 2017-06-23 NOTE — ED Notes (Signed)
Tele neurology consult inprogress

## 2017-06-24 ENCOUNTER — Observation Stay (HOSPITAL_COMMUNITY): Payer: Medicare HMO

## 2017-06-24 ENCOUNTER — Telehealth: Payer: Self-pay

## 2017-06-24 DIAGNOSIS — E038 Other specified hypothyroidism: Secondary | ICD-10-CM

## 2017-06-24 DIAGNOSIS — I5032 Chronic diastolic (congestive) heart failure: Secondary | ICD-10-CM | POA: Diagnosis not present

## 2017-06-24 DIAGNOSIS — N182 Chronic kidney disease, stage 2 (mild): Secondary | ICD-10-CM | POA: Diagnosis not present

## 2017-06-24 DIAGNOSIS — R4182 Altered mental status, unspecified: Secondary | ICD-10-CM

## 2017-06-24 DIAGNOSIS — I10 Essential (primary) hypertension: Secondary | ICD-10-CM | POA: Diagnosis not present

## 2017-06-24 DIAGNOSIS — F333 Major depressive disorder, recurrent, severe with psychotic symptoms: Secondary | ICD-10-CM

## 2017-06-24 LAB — COMPREHENSIVE METABOLIC PANEL
ALBUMIN: 3.6 g/dL (ref 3.5–5.0)
ALT: 28 U/L (ref 14–54)
ANION GAP: 9 (ref 5–15)
AST: 24 U/L (ref 15–41)
Alkaline Phosphatase: 66 U/L (ref 38–126)
BILIRUBIN TOTAL: 0.6 mg/dL (ref 0.3–1.2)
BUN: 15 mg/dL (ref 6–20)
CHLORIDE: 102 mmol/L (ref 101–111)
CO2: 27 mmol/L (ref 22–32)
Calcium: 9.2 mg/dL (ref 8.9–10.3)
Creatinine, Ser: 1.61 mg/dL — ABNORMAL HIGH (ref 0.44–1.00)
GFR calc Af Amer: 38 mL/min — ABNORMAL LOW (ref >60)
GFR calc non Af Amer: 33 mL/min — ABNORMAL LOW (ref >60)
Glucose, Bld: 105 mg/dL — ABNORMAL HIGH (ref 65–99)
POTASSIUM: 4.2 mmol/L (ref 3.5–5.1)
SODIUM: 138 mmol/L (ref 135–145)
TOTAL PROTEIN: 6.5 g/dL (ref 6.5–8.1)

## 2017-06-24 LAB — CBC
HCT: 40.2 % (ref 36.0–46.0)
Hemoglobin: 12.2 g/dL (ref 12.0–15.0)
MCH: 24.3 pg — ABNORMAL LOW (ref 26.0–34.0)
MCHC: 30.3 g/dL (ref 30.0–36.0)
MCV: 79.9 fL (ref 78.0–100.0)
PLATELETS: 230 10*3/uL (ref 150–400)
RBC: 5.03 MIL/uL (ref 3.87–5.11)
RDW: 14 % (ref 11.5–15.5)
WBC: 7.3 10*3/uL (ref 4.0–10.5)

## 2017-06-24 MED ORDER — FAMOTIDINE 20 MG PO TABS: 20.0000 mg | ORAL_TABLET | Freq: Two times a day (BID) | ORAL | 1 refills | Status: DC

## 2017-06-24 MED ORDER — GABAPENTIN 400 MG PO CAPS: 400.0000 mg | ORAL_CAPSULE | Freq: Three times a day (TID) | ORAL | Status: DC

## 2017-06-24 NOTE — Telephone Encounter (Addendum)
Transition Care Management Follow-up Telephone Call   Date discharged? 06/24/17   How have you been since you were released from the hospital? Patient states that she is feeling better since being discharged home.    Do you understand why you were in the hospital? yes   Do you understand the discharge instructions? yes   Where were you discharged to? home   Items Reviewed:  Medications reviewed: yes  Allergies reviewed: yes  Dietary changes reviewed: yes  Referrals reviewed: yes   Functional Questionnaire:   Activities of Daily Living (ADLs):   She states they are independent in the following: ambulation, bathing and hygiene, feeding, continence, grooming, toileting and dressing States they require assistance with the following: none   Any transportation issues/concerns?: no   Any patient concerns? no   Confirmed importance and date/time of follow-up visits scheduled yes  Provider Appointment booked with Dr. Clelia CroftShaw on 07/02/17 @ 10 am   Confirmed with patient if condition begins to worsen call PCP or go to the ER.  Patient was given the office number and encouraged to call back with question or concerns: yes

## 2017-06-24 NOTE — Progress Notes (Signed)
Patient IV removed, tolerated well. Patient given discharge instructions at bedside.  

## 2017-06-24 NOTE — Discharge Summary (Signed)
Physician Discharge Summary  Jennifer Davidson ZOX:[id number] DOB: 12-15-1955 DOA: 06/23/2017  PCP: [name], MD  Admit date: 06/23/2017 Discharge date: 06/24/2017  Time spent: 30 minutes  Recommendations for Outpatient Follow-up:  Repeat BMET to follow electrolytes and renal function  Reassess medications and adjust them as needed; patient taking to many different psychotropic medications that are most likely affecting her sensorium; needs dose adjustment and if possible tapering off some of them.  Discharge Diagnoses:  Active Problems:   Hypertension   Chronic kidney disease   Hypothyroid   Diastolic heart failure (HCC)   Mood disorder (HCC)   Major depression, recurrent, chronic (HCC)   Severe recurrent depression with psychosis (HCC)   Altered mental status   Discharge Condition: stable and improved. Instructed to follow up with PCP in 10 days.  Diet recommendation: heart healthy diet   Filed Weights   06/23/17 1118 06/23/17 2201  Weight: 96.6 kg (213 lb) 95.7 kg (210 lb 14.4 oz)    History of present illness:  As per H&P written by Dr. Sharl Ma on 06/23/17 62 y.o. female, with history of chronic back pain, depression, hypothyroidism, hypertension, anxiety who was brought to the ED as patient was confused this morning. Patient was seen in the ED on Tuesday at that time she was diagnosed with UTI and discharged on Keflex. Patient was lethargic required IV fluids on that day. As per husband patient was doing fine with no complaints. No chest pain or shortness of breath. No nausea vomiting or diarrhea. Denies dysuria. Urine culture from February 5 only grew multiple species  When patient woke up this morning she was unable to recognize her husband, she was brought to hospital and continue to be confused until around 7:30 PM. Neurology was consulted. CT had was negative for stroke. Neurologist, tele recommended, MRI brain a.m. to rule out stroke and adjustment of psychiatric  medications.  Hospital Course:  1-AMS: acute encephalopathy associated with medications. -CT head and MRI neg for acute abnormality  -no signs of active infection -will reduce dose on some of her psychotropic medications and will recommend close follow up with PCP and psychiatrist  -at discharge patient is AAOX3.  2-hypothyroidism -continue synthroid  -TSH WNL  3-recent UTI -treated with keflex -currently w/o dysuria -UA no suggesting infection -advise to keep herself well hydrated -no further antibiotics needed   4-depression/anxiety -will resume home meds, with adjusted dose -needs follow up with PCP and psychiatrist   5-HTN/chronic diastolic HF -advise to follow low sodium diet and check weight on daily basis -ok to resume lasix -will also continue home antihypertensive regimen  -gentle fluid resuscitation given on admission.  6-CKD stage 2: Cr at baseline 1.6 -stable -will recommend repeat of BMET at follow up visit to assess renal function trend    Procedures:  See below for x-ray reports   Consultations:  Tele-neurology consulted in ED.  Discharge Exam: Vitals:   06/23/17 2201 06/24/17 0547  BP: (!) 111/57   Pulse: 79 77  Resp: 20 14  Temp: 98.5 F (36.9 C) 97.8 F (36.6 C)  SpO2: 95% 94%    General: afebrile, no CP, no SOB. Patient AAOX3 and in no distress. Cardiovascular: S1 and S2, no rubs, no gallops, no JVD Respiratory: CTA bilaterally Abd: soft, NT, ND, positive BS Extremities: no cyanosis or clubbing   Discharge Instructions   Discharge Instructions    Diet - low sodium heart healthy   Complete by:  As directed  Discharge instructions   Complete by:  As directed    Take medications as prescribed Keep yourself well hydrated  Please arrange follow up with PCP in 10 days Follow heart healthy/low sodium diet     Allergies as of 06/24/2017      Reactions   Tramadol Nausea And Vomiting   Trazodone And Nefazodone Other (See Comments)    Per pt trazodone caused hallucinations and behavior changes      Medication List    STOP taking these medications   cephALEXin 500 MG capsule Commonly known as:  KEFLEX   cimetidine 400 MG tablet Commonly known as:  TAGAMET Replaced by:  famotidine 20 MG tablet   hydrOXYzine 100 MG capsule Commonly known as:  VISTARIL   topiramate 50 MG tablet Commonly known as:  TOPAMAX     TAKE these medications   ALPRAZolam 0.5 MG tablet Commonly known as:  XANAX Take 1 tablet (0.5 mg total) by mouth at bedtime.   azelastine 0.1 % nasal spray Commonly known as:  ASTELIN Place 1 spray into both nostrils 2 (two) times daily. Use in each nostril as directed   CALCIUM 1200+D3 PO Take 1 tablet by mouth every morning. Contains 800iu Vitamin D3   cholecalciferol 1000 units tablet Commonly known as:  VITAMIN D Take 1 tablet (1,000 Units total) by mouth daily at 12 noon. What changed:  when to take this   cholestyramine 4 GM/DOSE powder Commonly known as:  QUESTRAN Take 1 packet (4 g total) by mouth 3 (three) times daily with meals. As needed for diarrhea   diclofenac sodium 1 % Gel Commonly known as:  VOLTAREN Apply 2 g topically daily as needed (for knee pain).   DULoxetine 30 MG capsule Commonly known as:  CYMBALTA Take 1 capsule (30 mg total) by mouth at bedtime.   esomeprazole 40 MG capsule Commonly known as:  NEXIUM TAKE 1 CAPSULE (40 MG TOTAL) BY MOUTH DAILY.   famotidine 20 MG tablet Commonly known as:  PEPCID Take 1 tablet (20 mg total) by mouth 2 (two) times daily. Replaces:  cimetidine 400 MG tablet   fluticasone 50 MCG/ACT nasal spray Commonly known as:  FLONASE Place 2 sprays at bedtime into both nostrils.   furosemide 40 MG tablet Commonly known as:  LASIX Take 1 tablet (40 mg total) by mouth daily. What changed:  when to take this   gabapentin 400 MG capsule Commonly known as:  NEURONTIN Take 1 capsule (400 mg total) by mouth 3 (three) times  daily. What changed:  when to take this   levothyroxine 50 MCG tablet Commonly known as:  SYNTHROID, LEVOTHROID Take 1 tablet (50 mcg total) by mouth daily. What changed:  when to take this   ondansetron 4 MG disintegrating tablet Commonly known as:  ZOFRAN ODT Take 1 tablet (4 mg total) by mouth every 8 (eight) hours as needed.   Potassium Chloride ER 20 MEQ Tbcr Take 1 tablet by mouth daily. With lasix What changed:    when to take this  additional instructions   QUEtiapine 50 MG tablet Commonly known as:  SEROQUEL Take 1 tablet (50 mg total) by mouth at bedtime.   sucralfate 1 g tablet Commonly known as:  CARAFATE TAKE 1 TABLET FOUR TIMES DAILY WITH MEALS AND AT BEDTIME for heartburn, indigestion, and abdominal pain   triamcinolone cream 0.1 % Commonly known as:  KENALOG Apply 1 application topically 4 (four) times daily. For itching rash  Allergies  Allergen Reactions  . Tramadol Nausea And Vomiting  . Trazodone And Nefazodone Other (See Comments)    Per pt trazodone caused hallucinations and behavior changes    Follow-up Information    [name], MD. Schedule an appointment as soon as possible for a visit in 10 day(s).   Specialty:  Family Medicine Contact information: [address] [place] [address] [phone]           The results of significant diagnostics from this hospitalization (including imaging, microbiology, ancillary and laboratory) are listed below for reference.    Significant Diagnostic Studies: Ct Angio Head W Or Wo Contrast  Result Date: 06/23/2017 CLINICAL DATA:  Confusion, beginning earlier today. EXAM: CT ANGIOGRAPHY HEAD AND NECK TECHNIQUE: Multidetector CT imaging of the head and neck was performed using the standard protocol during bolus administration of intravenous contrast. Multiplanar CT image reconstructions and MIPs were obtained to evaluate the vascular anatomy. Carotid stenosis measurements (when applicable)  are obtained utilizing NASCET criteria, using the distal internal carotid diameter as the denominator. CONTRAST:  60mL ISOVUE-370 IOPAMIDOL (ISOVUE-370) INJECTION 76% COMPARISON:  MR head 08/09/2016 was unremarkable. FINDINGS: Brain: No evidence of acute infarction, hemorrhage, hydrocephalus, extra-axial collection or mass lesion/mass effect. Vascular: Reported separately, see below. Skull: Normal. Negative for fracture or focal lesion. Sinuses: Imaged portions are clear. Orbits: No acute finding. CTA NECK FINDINGS Aortic arch: Standard branching. Imaged portion shows no evidence of aneurysm or dissection. No significant stenosis of the major arch vessel origins. Right carotid system: Minor atheromatous change. No evidence of dissection, stenosis (50% or greater) or occlusion. Left carotid system: Minor atheromatous change. No evidence of dissection, stenosis (50% or greater) or occlusion. Vertebral arteries: Codominant. No evidence of dissection, stenosis (50% or greater) or occlusion. Skeleton: Spondylosis. Other neck: No masses. Upper chest: Mild vascular congestion in the lungs. No consolidation or pneumothorax. Review of the MIP images confirms the above findings CTA HEAD FINDINGS Anterior circulation: No significant stenosis, proximal occlusion, aneurysm, or vascular malformation. Posterior circulation: No significant stenosis, proximal occlusion, aneurysm, or vascular malformation. Venous sinuses: As permitted by contrast timing, patent. Anatomic variants: None of significance. Delayed phase: No abnormal intracranial enhancement. Review of the MIP images confirms the above findings IMPRESSION: Minor atheromatous change at the carotid bifurcations. No flow-limiting extracranial or intracranial stenosis or dissection. No acute intracranial findings. No abnormal postcontrast enhancement. Electronically Signed   By: Elsie Stain M.D.   On: 06/23/2017 16:44   Ct Angio Neck W And/or Wo Contrast  Result Date:  06/23/2017 CLINICAL DATA:  Confusion, beginning earlier today. EXAM: CT ANGIOGRAPHY HEAD AND NECK TECHNIQUE: Multidetector CT imaging of the head and neck was performed using the standard protocol during bolus administration of intravenous contrast. Multiplanar CT image reconstructions and MIPs were obtained to evaluate the vascular anatomy. Carotid stenosis measurements (when applicable) are obtained utilizing NASCET criteria, using the distal internal carotid diameter as the denominator. CONTRAST:  60mL ISOVUE-370 IOPAMIDOL (ISOVUE-370) INJECTION 76% COMPARISON:  MR head 08/09/2016 was unremarkable. FINDINGS: Brain: No evidence of acute infarction, hemorrhage, hydrocephalus, extra-axial collection or mass lesion/mass effect. Vascular: Reported separately, see below. Skull: Normal. Negative for fracture or focal lesion. Sinuses: Imaged portions are clear. Orbits: No acute finding. CTA NECK FINDINGS Aortic arch: Standard branching. Imaged portion shows no evidence of aneurysm or dissection. No significant stenosis of the major arch vessel origins. Right carotid system: Minor atheromatous change. No evidence of dissection, stenosis (50% or greater) or occlusion. Left carotid system: Minor atheromatous  change. No evidence of dissection, stenosis (50% or greater) or occlusion. Vertebral arteries: Codominant. No evidence of dissection, stenosis (50% or greater) or occlusion. Skeleton: Spondylosis. Other neck: No masses. Upper chest: Mild vascular congestion in the lungs. No consolidation or pneumothorax. Review of the MIP images confirms the above findings CTA HEAD FINDINGS Anterior circulation: No significant stenosis, proximal occlusion, aneurysm, or vascular malformation. Posterior circulation: No significant stenosis, proximal occlusion, aneurysm, or vascular malformation. Venous sinuses: As permitted by contrast timing, patent. Anatomic variants: None of significance. Delayed phase: No abnormal intracranial  enhancement. Review of the MIP images confirms the above findings IMPRESSION: Minor atheromatous change at the carotid bifurcations. No flow-limiting extracranial or intracranial stenosis or dissection. No acute intracranial findings. No abnormal postcontrast enhancement. Electronically Signed   By: John T Curnes M.D.   On: 06/23/2017 16:44   Mr Brain Wo Contrast  Result Date: 06/24/2017 CLINICAL DATA:  62 year old female with confusion and altered mental status. EXAM: MRI HEAD WIElsie StainHOUT CONTRAST TECHNIQUE: Multiplanar, multiecho pulse sequences of the brain and surrounding structures were obtained without intravenous contrast. COMPARISON:  CTA head and neck 06/23/2017.  Brain MRI 08/09/2016. FINDINGS: Brain: No restricted diffusion to suggest acute infarction. No midline shift, mass effect, evidence of mass lesion, ventriculomegaly, extra-axial collection or acute intracranial hemorrhage. Cervicomedullary junction and pituitary are within normal limits. Stable Gray and white matter signal which is normal for age throughout the brain. No encephalomalacia or chronic cerebral blood products identified. The deep gray matter nuclei, brainstem, and cerebellum appear normal. Vascular: Major intracranial vascular flow voids are stable. Skull and upper cervical spine: Stable and negative. Sinuses/Orbits: Stable and negative. Other: Mastoids remain clear. Visible internal auditory structures appear normal. Scalp and face soft tissues appear negative. IMPRESSION: Stable since 2016 and normal for age noncontrast MRI appearance of the brain. Electronically Signed   By: Odessa FlemingH  Hall M.D.   On: 06/24/2017 10:42    Microbiology: Recent Results (from the past 240 hour(s))  Urine culture     Status: Abnormal   Collection Time: 06/21/17  6:29 PM  Result Value Ref Range Status   Specimen Description   Final    URINE, RANDOM Performed at [facility], [address] [place], [place] [address]    Special Requests   Final     NONE Performed at [facility], [address] [place], [place] [address]    Culture MULTIPLE SPECIES PRESENT, SUGGEST RECOLLECTION (A)  Final   Report Status 06/23/2017 FINAL  Final     Labs: Basic Metabolic Panel: Recent Labs  Lab 06/21/17 1055 06/23/17 1240 06/24/17 0820  NA 137 138 138  K 3.9 3.9 4.2  CL 102 98* 102  CO2 23 28 27   GLUCOSE 117* 113* 105*  BUN 15 14 15   CREATININE 1.80* 1.69* 1.61*  CALCIUM 9.8 9.5 9.2   Liver Function Tests: Recent Labs  Lab 06/21/17 1055 06/23/17 1240 06/24/17 0820  AST 34 28 24  ALT 30 29 28   ALKPHOS 77 63 66  BILITOT 0.5 0.8 0.6  PROT 7.4 6.7 6.5  ALBUMIN 4.2 3.7 3.6   CBC: Recent Labs  Lab 06/21/17 1055 06/23/17 1240 06/24/17 0820  WBC 8.9 6.6 7.3  NEUTROABS 5.0 3.5  --   HGB 13.2 11.9* 12.2  HCT 41.3 38.9 40.2  MCV 77.1* 79.4 79.9  PLT 264 201 230    Signed:  Vassie Lollarlos Jeyla Bulger MD.  [facility] 06/24/2017, 12:17 PM

## 2017-07-02 ENCOUNTER — Emergency Department (HOSPITAL_COMMUNITY)
Admission: EM | Admit: 2017-07-02 | Discharge: 2017-07-04 | Disposition: A | Discharge status: Other Acute Inpatient Hospital | Payer: Medicare HMO | Attending: Emergency Medicine | Admitting: Emergency Medicine

## 2017-07-02 ENCOUNTER — Other Ambulatory Visit: Payer: Self-pay

## 2017-07-02 ENCOUNTER — Encounter (HOSPITAL_COMMUNITY): Payer: Self-pay | Admitting: Emergency Medicine

## 2017-07-02 ENCOUNTER — Encounter: Payer: Self-pay | Admitting: Family Medicine

## 2017-07-02 ENCOUNTER — Ambulatory Visit (INDEPENDENT_AMBULATORY_CARE_PROVIDER_SITE_OTHER): Payer: Medicare HMO | Admitting: Family Medicine

## 2017-07-02 VITALS — BP 112/82 | HR 82 | Temp 98.2°F | Ht 62.0 in | Wt 211.0 lb

## 2017-07-02 DIAGNOSIS — R4587 Impulsiveness: Secondary | ICD-10-CM | POA: Diagnosis not present

## 2017-07-02 DIAGNOSIS — I130 Hypertensive heart and chronic kidney disease with heart failure and stage 1 through stage 4 chronic kidney disease, or unspecified chronic kidney disease: Secondary | ICD-10-CM | POA: Insufficient documentation

## 2017-07-02 DIAGNOSIS — I5030 Unspecified diastolic (congestive) heart failure: Secondary | ICD-10-CM | POA: Diagnosis not present

## 2017-07-02 DIAGNOSIS — R55 Syncope and collapse: Secondary | ICD-10-CM | POA: Insufficient documentation

## 2017-07-02 DIAGNOSIS — E860 Dehydration: Secondary | ICD-10-CM

## 2017-07-02 DIAGNOSIS — F449 Dissociative and conversion disorder, unspecified: Secondary | ICD-10-CM

## 2017-07-02 DIAGNOSIS — Z79899 Other long term (current) drug therapy: Secondary | ICD-10-CM | POA: Diagnosis not present

## 2017-07-02 DIAGNOSIS — F32A Depression, unspecified: Secondary | ICD-10-CM

## 2017-07-02 DIAGNOSIS — N390 Urinary tract infection, site not specified: Secondary | ICD-10-CM | POA: Diagnosis not present

## 2017-07-02 DIAGNOSIS — F333 Major depressive disorder, recurrent, severe with psychotic symptoms: Secondary | ICD-10-CM | POA: Diagnosis not present

## 2017-07-02 DIAGNOSIS — N189 Chronic kidney disease, unspecified: Secondary | ICD-10-CM | POA: Diagnosis not present

## 2017-07-02 DIAGNOSIS — R450 Nervousness: Secondary | ICD-10-CM | POA: Diagnosis not present

## 2017-07-02 DIAGNOSIS — F05 Delirium due to known physiological condition: Secondary | ICD-10-CM | POA: Diagnosis not present

## 2017-07-02 DIAGNOSIS — F444 Conversion disorder with motor symptom or deficit: Secondary | ICD-10-CM | POA: Diagnosis not present

## 2017-07-02 DIAGNOSIS — F329 Major depressive disorder, single episode, unspecified: Secondary | ICD-10-CM | POA: Diagnosis not present

## 2017-07-02 DIAGNOSIS — E039 Hypothyroidism, unspecified: Secondary | ICD-10-CM | POA: Insufficient documentation

## 2017-07-02 DIAGNOSIS — F419 Anxiety disorder, unspecified: Secondary | ICD-10-CM | POA: Insufficient documentation

## 2017-07-02 DIAGNOSIS — N183 Chronic kidney disease, stage 3 unspecified: Secondary | ICD-10-CM

## 2017-07-02 DIAGNOSIS — R4182 Altered mental status, unspecified: Secondary | ICD-10-CM | POA: Diagnosis not present

## 2017-07-02 DIAGNOSIS — R410 Disorientation, unspecified: Secondary | ICD-10-CM

## 2017-07-02 DIAGNOSIS — F411 Generalized anxiety disorder: Secondary | ICD-10-CM | POA: Diagnosis present

## 2017-07-02 DIAGNOSIS — R45851 Suicidal ideations: Secondary | ICD-10-CM | POA: Diagnosis not present

## 2017-07-02 DIAGNOSIS — F39 Unspecified mood [affective] disorder: Secondary | ICD-10-CM | POA: Diagnosis present

## 2017-07-02 LAB — COMPREHENSIVE METABOLIC PANEL
ALT: 32 U/L (ref 14–54)
AST: 31 U/L (ref 15–41)
Albumin: 4.4 g/dL (ref 3.5–5.0)
Alkaline Phosphatase: 77 U/L (ref 38–126)
Anion gap: 11 (ref 5–15)
BUN: 16 mg/dL (ref 6–20)
CO2: 26 mmol/L (ref 22–32)
Calcium: 9.6 mg/dL (ref 8.9–10.3)
Chloride: 103 mmol/L (ref 101–111)
Creatinine, Ser: 1.39 mg/dL — ABNORMAL HIGH (ref 0.44–1.00)
GFR calc Af Amer: 46 mL/min — ABNORMAL LOW (ref >60)
GFR calc non Af Amer: 39 mL/min — ABNORMAL LOW (ref >60)
Glucose, Bld: 97 mg/dL (ref 65–99)
Potassium: 3.6 mmol/L (ref 3.5–5.1)
Sodium: 140 mmol/L (ref 135–145)
Total Bilirubin: 0.6 mg/dL (ref 0.3–1.2)
Total Protein: 7.7 g/dL (ref 6.5–8.1)

## 2017-07-02 LAB — URINALYSIS, ROUTINE W REFLEX MICROSCOPIC
Bilirubin Urine: NEGATIVE
Glucose, UA: NEGATIVE mg/dL
Hgb urine dipstick: NEGATIVE
Ketones, ur: NEGATIVE mg/dL
Leukocytes, UA: NEGATIVE
Nitrite: NEGATIVE
Protein, ur: NEGATIVE mg/dL
Specific Gravity, Urine: 1.006 (ref 1.005–1.030)
pH: 6 (ref 5.0–8.0)

## 2017-07-02 LAB — CBC
HCT: 42.4 % (ref 36.0–46.0)
Hemoglobin: 13.3 g/dL (ref 12.0–15.0)
MCH: 24.7 pg — ABNORMAL LOW (ref 26.0–34.0)
MCHC: 31.4 g/dL (ref 30.0–36.0)
MCV: 78.7 fL (ref 78.0–100.0)
Platelets: 225 10*3/uL (ref 150–400)
RBC: 5.39 MIL/uL — ABNORMAL HIGH (ref 3.87–5.11)
RDW: 14.1 % (ref 11.5–15.5)
WBC: 10.3 10*3/uL (ref 4.0–10.5)

## 2017-07-02 LAB — POCT URINALYSIS DIP (MANUAL ENTRY)
BILIRUBIN UA: NEGATIVE
Blood, UA: NEGATIVE
GLUCOSE UA: NEGATIVE mg/dL
Ketones, POC UA: NEGATIVE mg/dL
LEUKOCYTES UA: NEGATIVE
NITRITE UA: NEGATIVE
Protein Ur, POC: NEGATIVE mg/dL
Spec Grav, UA: 1.01 (ref 1.010–1.025)
UROBILINOGEN UA: 0.2 U/dL
pH, UA: 5.5 (ref 5.0–8.0)

## 2017-07-02 LAB — CBG MONITORING, ED: Glucose-Capillary: 93 mg/dL (ref 65–99)

## 2017-07-02 NOTE — ED Triage Notes (Signed)
Patient brought in by husband reports being at PCP. Recent hospitalization on 02-08 for confusion. Per her husband, she was hallucinating (seeing her sister, her late parents, being unaware of people coming in and out of the house, talking to people that are not there) when she was hospitalized. She states she does not sleep. Her husband states he gave her a xanax to help her sleep, but she does not recall ever having slept. SI reports plan to shoot self with gun.

## 2017-07-02 NOTE — Progress Notes (Signed)
Per Jennifer ConnJason Berry, NP pt is recommended for inpt treatment. TTS to seek placement. EDP Dr. Juleen ChinaKohut, MD has been advised of the disposition. TTS attempted to notify the pt's nurse who was unavailable to come to the phone.  Jennifer BruinsAquicha Hitesh Davidson, MSW, LCSW Therapeutic Triage Specialist  [phone]
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
## 2017-07-02 NOTE — Patient Instructions (Signed)
     IF you received an x-ray today, you will receive an invoice from [facility]. Please contact  Radiology at [phone] with questions or concerns regarding your invoice.   IF you received labwork today, you will receive an invoice from [facility]. Please contact [facility] at [phone] with questions or concerns regarding your invoice.   Our billing staff will not be able to assist you with questions regarding bills from these companies.  You will be contacted with the lab results as soon as they are available. The fastest way to get your results is to activate your My Chart account. Instructions are located on the last page of this paperwork. If you have not heard from us regarding the results in 2 weeks, please contact this office.     

## 2017-07-03 DIAGNOSIS — R4182 Altered mental status, unspecified: Secondary | ICD-10-CM | POA: Diagnosis not present

## 2017-07-03 DIAGNOSIS — R450 Nervousness: Secondary | ICD-10-CM

## 2017-07-03 DIAGNOSIS — R4587 Impulsiveness: Secondary | ICD-10-CM | POA: Diagnosis not present

## 2017-07-03 DIAGNOSIS — F419 Anxiety disorder, unspecified: Secondary | ICD-10-CM

## 2017-07-03 DIAGNOSIS — F333 Major depressive disorder, recurrent, severe with psychotic symptoms: Secondary | ICD-10-CM

## 2017-07-03 LAB — BASIC METABOLIC PANEL
BUN / CREAT RATIO: 10 — AB (ref 12–28)
BUN: 15 mg/dL (ref 8–27)
CALCIUM: 9.9 mg/dL (ref 8.7–10.3)
CO2: 23 mmol/L (ref 20–29)
CREATININE: 1.56 mg/dL — AB (ref 0.57–1.00)
Chloride: 101 mmol/L (ref 96–106)
GFR calc Af Amer: 40 mL/min/{1.73_m2} — ABNORMAL LOW (ref >59)
GFR, EST NON AFRICAN AMERICAN: 35 mL/min/{1.73_m2} — AB (ref >59)
Glucose: 99 mg/dL (ref 65–99)
Potassium: 3.8 mmol/L (ref 3.5–5.2)
Sodium: 141 mmol/L (ref 134–144)

## 2017-07-03 LAB — RAPID URINE DRUG SCREEN, HOSP PERFORMED
AMPHETAMINES: NOT DETECTED
BARBITURATES: NOT DETECTED
Benzodiazepines: POSITIVE — AB
Cocaine: NOT DETECTED
OPIATES: NOT DETECTED
TETRAHYDROCANNABINOL: NOT DETECTED

## 2017-07-03 LAB — URINALYSIS, ROUTINE W REFLEX MICROSCOPIC
BILIRUBIN URINE: NEGATIVE
Glucose, UA: NEGATIVE mg/dL
Hgb urine dipstick: NEGATIVE
KETONES UR: NEGATIVE mg/dL
Leukocytes, UA: NEGATIVE
NITRITE: NEGATIVE
PH: 6 (ref 5.0–8.0)
Protein, ur: NEGATIVE mg/dL
Specific Gravity, Urine: 1.005 (ref 1.005–1.030)

## 2017-07-03 MED ORDER — DULOXETINE HCL 30 MG PO CPEP
30.0000 mg | ORAL_CAPSULE | Freq: Every day | ORAL | Status: DC
  Administered 2017-07-03: 30 mg via ORAL
  Filled 2017-07-03: qty 1

## 2017-07-03 MED ORDER — ONDANSETRON 4 MG PO TBDP: 4.0000 mg | ORAL_TABLET | Freq: Three times a day (TID) | ORAL | Status: DC | PRN

## 2017-07-03 MED ORDER — CALCIUM CARBONATE-VITAMIN D 500-200 MG-UNIT PO TABS
1.0000 | ORAL_TABLET | Freq: Every morning | ORAL | Status: DC
  Administered 2017-07-03 – 2017-07-04 (×2): 1 via ORAL
  Filled 2017-07-03 (×2): qty 1

## 2017-07-03 MED ORDER — ALPRAZOLAM 0.5 MG PO TABS
0.5000 mg | ORAL_TABLET | Freq: Every day | ORAL | Status: DC
  Administered 2017-07-03: 0.5 mg via ORAL
  Filled 2017-07-03: qty 1

## 2017-07-03 MED ORDER — DULOXETINE HCL 20 MG PO CPEP
40.0000 mg | ORAL_CAPSULE | Freq: Every day | ORAL | Status: DC
  Administered 2017-07-03: 40 mg via ORAL
  Filled 2017-07-03 (×2): qty 2

## 2017-07-03 MED ORDER — GABAPENTIN 400 MG PO CAPS
400.0000 mg | ORAL_CAPSULE | Freq: Three times a day (TID) | ORAL | Status: DC
  Administered 2017-07-03 (×2): 400 mg via ORAL
  Filled 2017-07-03 (×2): qty 1

## 2017-07-03 MED ORDER — PANTOPRAZOLE SODIUM 40 MG PO TBEC
40.0000 mg | DELAYED_RELEASE_TABLET | Freq: Every day | ORAL | Status: DC
  Administered 2017-07-03 – 2017-07-04 (×2): 40 mg via ORAL
  Filled 2017-07-03 (×2): qty 1

## 2017-07-03 MED ORDER — SUCRALFATE 1 G PO TABS
1.0000 g | ORAL_TABLET | Freq: Three times a day (TID) | ORAL | Status: DC
  Administered 2017-07-03 – 2017-07-04 (×5): 1 g via ORAL
  Filled 2017-07-03 (×6): qty 1

## 2017-07-03 MED ORDER — FLUTICASONE PROPIONATE 50 MCG/ACT NA SUSP
2.0000 | Freq: Every day | NASAL | Status: DC
  Administered 2017-07-03 (×2): 2 via NASAL
  Filled 2017-07-03: qty 16

## 2017-07-03 MED ORDER — QUETIAPINE FUMARATE 100 MG PO TABS
100.0000 mg | ORAL_TABLET | Freq: Every day | ORAL | Status: DC
  Administered 2017-07-03: 100 mg via ORAL
  Filled 2017-07-03: qty 1

## 2017-07-03 MED ORDER — FAMOTIDINE 20 MG PO TABS
20.0000 mg | ORAL_TABLET | Freq: Two times a day (BID) | ORAL | Status: DC
  Administered 2017-07-03 – 2017-07-04 (×4): 20 mg via ORAL
  Filled 2017-07-03 (×3): qty 1

## 2017-07-03 MED ORDER — GABAPENTIN 300 MG PO CAPS
600.0000 mg | ORAL_CAPSULE | Freq: Three times a day (TID) | ORAL | Status: DC
  Administered 2017-07-03 – 2017-07-04 (×3): 600 mg via ORAL
  Filled 2017-07-03 (×3): qty 2

## 2017-07-03 MED ORDER — FUROSEMIDE 40 MG PO TABS
40.0000 mg | ORAL_TABLET | Freq: Every day | ORAL | Status: DC
  Administered 2017-07-03: 40 mg via ORAL
  Filled 2017-07-03 (×2): qty 1

## 2017-07-03 MED ORDER — QUETIAPINE FUMARATE 50 MG PO TABS
50.0000 mg | ORAL_TABLET | Freq: Every day | ORAL | Status: DC
  Administered 2017-07-03: 50 mg via ORAL
  Filled 2017-07-03: qty 1

## 2017-07-03 MED ORDER — LEVOTHYROXINE SODIUM 50 MCG PO TABS
50.0000 ug | ORAL_TABLET | Freq: Every day | ORAL | Status: DC
  Administered 2017-07-03 – 2017-07-04 (×2): 50 ug via ORAL
  Filled 2017-07-03 (×2): qty 1

## 2017-07-03 NOTE — ED Notes (Signed)
Bed: W[facility]42 Expected date:  Expected time:  Means of arrival:  Comments: [place] 

## 2017-07-03 NOTE — ED Notes (Signed)
SBAR Report received from previous nurse. Pt received calm and visible on unit. Pt denies current SI/ HI, A/V H, depression, or anxiety at this time, and appears otherwise stable and free of distress. Pt does endorse pain and asked for her home meds.. Pt reminded of camera surveillance, q 15 min rounds, and rules of the milieu. Will continue to assess.

## 2017-07-03 NOTE — BH Assessment (Signed)
[facility] Assessment Progress Note      Dr. Akintayo recommends inpatient placement.   

## 2017-07-03 NOTE — Progress Notes (Signed)
Referred pt to the following hospitals for psychiatric [facility]: [facility]  Ilean SkillMeghan Jaianna Nicoll, MSW, [place] Clinical Social Work 07/03/2017 [phone]

## 2017-07-03 NOTE — ED Notes (Signed)
SBAR Report received from previous nurse. Pt received calm and visible on unit. Pt denies current SI/ HI, A/V H, depression, or anxiety at this time, and appears otherwise stable and free of distress. Pt reports she has had no hallucinations today and is generally feeling better. Pt reminded of camera surveillance, q 15 min rounds, and rules of the milieu. Will continue to assess.

## 2017-07-03 NOTE — ED Notes (Signed)
Specimen cup provided. Pt encouraged to provide urine per MD order  

## 2017-07-03 NOTE — Consult Note (Addendum)
Jennifer Davidson Psychiatry Consult   Reason for Consult:  Altered Mental Status Referring Physician:  EDP Patient Identification: Jennifer Davidson MRN:  [id number] Principal Diagnosis: Severe recurrent major depressive disorder with psychotic features [facility]) Diagnosis:   Patient Active Problem List   Diagnosis Date Noted  . Altered mental status [R41.82] 06/23/2017  . Irritable bowel syndrome with diarrhea [K58.0] 04/25/2017  . Sacroiliac joint dysfunction of right side [M53.3] 03/17/2017  . 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 Seizure-like activity ([place]) [R56.9] 09/14/2016  . Moderate episode of recurrent major depressive disorder ([place]) [F33.1] 08/31/2016  . PTSD (post-traumatic stress disorder) [F43.10] 08/31/2016  . UTI (urinary tract infection) [N39.0] 06/25/2016  . Acute encephalopathy [G93.40] 06/25/2016  . Severe recurrent depression with psychosis ([place]) [F33.3] 06/25/2016  . Acute gastroenteritis [K52.9] 06/23/2016  . GERD (gastroesophageal reflux disease) [K21.9] 11/13/2015  . Syncope [R55] 08/30/2015  . Generalized anxiety disorder [F41.1] 06/14/2015  . Chronic pain of right lower extremity [M79.604, G89.29] 05/14/2015  . Neuropathy of lower extremity [G57.90] 05/14/2015  . Headache, migraine [G43.909] 01/17/2015  . Severe recurrent major depressive disorder with psychotic features ([place]) [F33.3]   . Major depression, recurrent, chronic ([place]) [F33.9] 12/02/2014  . Mood disorder ([place]) [F39] 12/01/2014  . Chronic back pain [M54.9, G89.29]   . Hyperparathyroidism, secondary renal ([place]) [N25.81] 07/31/2014  . Neuromuscular disorder ([place]) [G70.9] 07/31/2014  . Lower back pain [M54.5] 09/21/2013  . Diastolic heart failure ([place]) [I50.30] 03/17/2012  . Syncope and collapse [R55] 02/12/2012  . Hypertension [I10] 02/04/2012  . Chronic kidney disease [N18.9] 02/04/2012  . Edema [R60.9] 02/04/2012  . Tachycardia [R00.0] 02/04/2012  . Encounter for  long-term (current) use of medications [Z79.899] 02/04/2012  . Anxiety and depression [F41.9, F32.9] 02/04/2012  . Hypothyroid [E03.9] 02/04/2012  . Hyperlipidemia [E78.5] 02/04/2012    Total Time spent with patient: 45 minutes  Subjective:   Jennifer Davidson is a 62 y.o. female patient admitted with altered mental status.  HPI:  Pt was seen and chart reviewed with treatment team and Dr Jennifer Davidson. Pt stated she was at her PCP's office and they recommended she come to the hospital for evaluation of altered mental status and confusion. Pt's husband reported to the PCP that the Pt had been hallucinating at home. Pt does not recall this. Pt stated she does not sleep well and is a "nervous type person." Pt stated she has taken Xanax for many years for her nervousness. Pt appears confused and is having difficulty with recalling some of the events of the past two days. Pt does recall talking with people who were not there but can not recall that she made suicidal statements to her husband. Pt's UDS positive for benzos (prescribed by her PCP) and Bal is negative. Pt would benefit from an inpatient psychiatric admission for crisis stabilization and medication management.   Past Psychiatric History: As above   Risk to Self: None today Risk to Others: None  Prior Inpatient Therapy: Prior Inpatient Therapy: Yes Prior Therapy Dates: 2016 Prior Therapy Facilty/Provider(s): [facility] Reason for Treatment: MDD, SI Prior Outpatient Therapy: Prior Outpatient Therapy: Yes Prior Therapy Dates: 2018 Prior Therapy Facilty/Provider(s): [facility] Reason for Treatment: med management  Does patient have an ACCT team?: No Does patient have Intensive In-House Services?  : No Does patient have [facility] services? : No Does patient have P4CC services?: No  Past Medical History:  Past Medical History:  Diagnosis Date  . Anxiety   .  Cholesterol serum increased   . Chronic back pain     chronic Rt low back pain. s/p L4-5 fusion. failed Rt facet injections. poss due to Rt SI joint dysfunction.  . Chronic kidney disease   . Depression   . Diastolic heart failure ([place])   . GERD (gastroesophageal reflux disease)   . Hyperparathyroidism ([place])   . Hypertension   . Hypothyroidism   . Migraine   . Neuromuscular disorder ([place])   . Sleep apnea   . Tachycardia   . Trochanteric bursitis of both hips 2012   Confirmed on MRI    Past Surgical History:  Procedure Laterality Date  . ABDOMINAL HYSTERECTOMY    . APPENDECTOMY    . BACK SURGERY     L4-5 fusion  . KNEE SURGERY     Family History:  Family History  Problem Relation Age of Onset  . Diabetes Mother   . Heart disease Mother   . Kidney disease Mother   . Thyroid disease Mother   . Heart disease Father   . Seizures Father   . COPD Father   . Cancer Maternal Grandmother    Family Psychiatric  History: Unknown Social History:  Social History   Substance and Sexual Activity  Alcohol Use No     Social History   Substance and Sexual Activity  Drug Use No   Comment: 08-31-2016 PER PT NO     Social History   Socioeconomic History  . Marital status: Married    Spouse name: None  . Number of children: 3  . Years of education: 75  . Highest education level: Some college, no degree  Social Needs  . Financial resource strain: Not hard at all  . Food insecurity - worry: Never true  . Food insecurity - inability: Never true  . Transportation needs - medical: No  . Transportation needs - non-medical: No  Occupational History  . None  Tobacco Use  . Smoking status: Never Smoker  . Smokeless tobacco: Never Used  Substance and Sexual Activity  . Alcohol use: No  . Drug use: No    Comment: 08-31-2016 PER PT NO   . Sexual activity: Yes    Partners: Male    Comment: married  Other Topics Concern  . None  Social History Narrative  . None   Additional Social History:    Allergies:   Allergies   Allergen Reactions  . Tramadol Nausea And Vomiting  . Trazodone And Nefazodone Other (See Comments)    Per pt trazodone caused hallucinations and behavior changes     Labs:  Results for orders placed or performed during the hospital encounter of 07/02/17 (from the past 48 hour(s))  CBG monitoring, ED     Status: None   Collection Time: 07/02/17  8:11 PM  Result Value Ref Range   Glucose-Capillary 93 65 - 99 mg/dL  Comprehensive metabolic panel     Status: Abnormal   Collection Time: 07/02/17  8:18 PM  Result Value Ref Range   Sodium 140 135 - 145 mmol/L   Potassium 3.6 3.5 - 5.1 mmol/L   Chloride 103 101 - 111 mmol/L   CO2 26 22 - 32 mmol/L   Glucose, Bld 97 65 - 99 mg/dL   BUN 16 6 - 20 mg/dL   Creatinine, Ser 1.39 (H) 0.44 - 1.00 mg/dL   Calcium 9.6 8.9 - 10.3 mg/dL   Total Protein 7.7 6.5 - 8.1 g/dL   Albumin 4.4 3.5 - 5.0  g/dL   AST 31 15 - 41 U/L   ALT 32 14 - 54 U/L   Alkaline Phosphatase 77 38 - 126 U/L   Total Bilirubin 0.6 0.3 - 1.2 mg/dL   GFR calc non Af Amer 39 (L) >60 mL/min   GFR calc Af Amer 46 (L) >60 mL/min    Comment: (NOTE) The eGFR has been calculated using the CKD EPI equation. This calculation has not been validated in all clinical situations. eGFR's persistently <60 mL/min signify possible Chronic Kidney Disease.    Anion gap 11 5 - 15    Comment: Performed at [facility], [place] [address] [place], [place] [address]  CBC     Status: Abnormal   Collection Time: 07/02/17  8:18 PM  Result Value Ref Range   WBC 10.3 4.0 - 10.5 K/uL   RBC 5.39 (H) 3.87 - 5.11 MIL/uL   Hemoglobin 13.3 12.0 - 15.0 g/dL   HCT 42.4 36.0 - 46.0 %   MCV 78.7 78.0 - 100.0 fL   MCH 24.7 (L) 26.0 - 34.0 pg   MCHC 31.4 30.0 - 36.0 g/dL   RDW 14.1 11.5 - 15.5 %   Platelets 225 150 - 400 K/uL    Comment: Performed at [facility], [place] [address] [place], [place] [address]  Urinalysis, Routine w reflex microscopic     Status: Abnormal    Collection Time: 07/02/17  8:18 PM  Result Value Ref Range   Color, Urine STRAW (A) YELLOW   APPearance CLEAR CLEAR   Specific Gravity, Urine 1.006 1.005 - 1.030   pH 6.0 5.0 - 8.0   Glucose, UA NEGATIVE NEGATIVE mg/dL   Hgb urine dipstick NEGATIVE NEGATIVE   Bilirubin Urine NEGATIVE NEGATIVE   Ketones, ur NEGATIVE NEGATIVE mg/dL   Protein, ur NEGATIVE NEGATIVE mg/dL   Nitrite NEGATIVE NEGATIVE   Leukocytes, UA NEGATIVE NEGATIVE    Comment: Performed at [facility] [address], [place], [place] [address]  Rapid urine drug screen (hospital performed)     Status: Abnormal   Collection Time: 07/03/17 11:40 AM  Result Value Ref Range   Opiates NONE DETECTED NONE DETECTED   Cocaine NONE DETECTED NONE DETECTED   Benzodiazepines POSITIVE (A) NONE DETECTED   Amphetamines NONE DETECTED NONE DETECTED   Tetrahydrocannabinol NONE DETECTED NONE DETECTED   Barbiturates NONE DETECTED NONE DETECTED    Comment: (NOTE) DRUG SCREEN FOR MEDICAL PURPOSES ONLY.  IF CONFIRMATION IS NEEDED FOR ANY PURPOSE, NOTIFY LAB WITHIN 5 DAYS. LOWEST DETECTABLE LIMITS FOR URINE DRUG SCREEN Drug Class                     Cutoff (ng/mL) Amphetamine and metabolites    1000 Barbiturate and metabolites    200 Benzodiazepine                 867 Tricyclics and metabolites     300 Opiates and metabolites        300 Cocaine and metabolites        300 THC                            50 Performed at [facility], [place] [address], [place],  [address]   Urinalysis, Routine w reflex microscopic     Status: Abnormal   Collection Time: 07/03/17 11:40 AM  Result Value Ref Range   Color, Urine STRAW (  A) YELLOW   APPearance CLEAR CLEAR   Specific Gravity, Urine 1.005 1.005 - 1.030   pH 6.0 5.0 - 8.0   Glucose, UA NEGATIVE NEGATIVE mg/dL   Hgb urine dipstick NEGATIVE NEGATIVE   Bilirubin Urine NEGATIVE NEGATIVE   Ketones, ur NEGATIVE NEGATIVE mg/dL   Protein, ur  NEGATIVE NEGATIVE mg/dL   Nitrite NEGATIVE NEGATIVE   Leukocytes, UA NEGATIVE NEGATIVE    Comment: Performed at [facility] [address], [place], [place] [address]    Current Facility-Administered Medications  Medication Dose Route Frequency Provider Last Rate Last Dose  . calcium-vitamin D (OSCAL WITH D) 500-200 MG-UNIT per tablet 1 tablet  1 tablet Oral q morning - 10a Virgel Manifold, MD   1 tablet at 07/03/17 [phone]  . DULoxetine (CYMBALTA) DR capsule 40 mg  40 mg Oral QHS Marjo Grosvenor, MD      . famotidine (PEPCID) tablet 20 mg  20 mg Oral BID Virgel Manifold, MD   20 mg at 07/03/17 0917  . fluticasone (FLONASE) 50 MCG/ACT nasal spray 2 spray  2 spray Each Nare QHS Virgel Manifold, MD   2 spray at 07/03/17 0054  . furosemide (LASIX) tablet 40 mg  40 mg Oral Daily Virgel Manifold, MD   40 mg at 07/03/17 0917  . gabapentin (NEURONTIN) capsule 600 mg  600 mg Oral TID Sansa Alkema, MD      . levothyroxine (SYNTHROID, LEVOTHROID) tablet 50 mcg  50 mcg Oral QAC breakfast Virgel Manifold, MD   50 mcg at 07/03/17 [phone]  . ondansetron (ZOFRAN-ODT) disintegrating tablet 4 mg  4 mg Oral Q8H PRN Virgel Manifold, MD      . pantoprazole (PROTONIX) EC tablet 40 mg  40 mg Oral Daily Virgel Manifold, MD   40 mg at 07/03/17 0916  . QUEtiapine (SEROQUEL) tablet 100 mg  100 mg Oral QHS Westen Dinino, MD      . sucralfate (CARAFATE) tablet 1 g  1 g Oral TID WC & HS Virgel Manifold, MD   1 g at 07/03/17 1134   Current Outpatient Medications  Medication Sig Dispense Refill  . ALPRAZolam (XANAX) 0.5 MG tablet Take 1 tablet (0.5 mg total) by mouth at bedtime. 90 tablet 0  . azelastine (ASTELIN) 0.1 % nasal spray Place 1 spray into both nostrils 2 (two) times daily. Use in each nostril as directed (Patient taking differently: Place 1 spray into both nostrils 2 (two) times daily as needed (for allergies.). Use in each nostril as directed) 30 mL 12  . Calcium-Magnesium-Vitamin D (CALCIUM  1200+D3 PO) Take 1 tablet by mouth every morning. Contains 800iu Vitamin D3     . cholecalciferol (VITAMIN D) 1000 UNITS tablet Take 1 tablet (1,000 Units total) by mouth daily at 12 noon. (Patient taking differently: Take 1,000 Units by mouth daily. ) 30 tablet 0  . cholestyramine (QUESTRAN) 4 GM/DOSE powder Take 1 packet (4 g total) by mouth 3 (three) times daily with meals. As needed for diarrhea (Patient taking differently: Take 4 g by mouth 3 (three) times daily as needed (for diarrhea.). As needed for diarrhea) 378 g 2  . diclofenac sodium (VOLTAREN) 1 % GEL Apply 2 g topically daily as needed (for knee pain).    . DULoxetine (CYMBALTA) 30 MG capsule Take 1 capsule (30 mg total) by mouth at bedtime. 90 capsule 0  . esomeprazole (NEXIUM) 40 MG capsule TAKE 1 CAPSULE (40 MG TOTAL) BY MOUTH DAILY. 90 capsule 3  . famotidine (PEPCID)  20 MG tablet Take 1 tablet (20 mg total) by mouth 2 (two) times daily. 60 tablet 1  . fluticasone (FLONASE) 50 MCG/ACT nasal spray Place 2 sprays at bedtime into both nostrils. (Patient taking differently: Place 2 sprays into both nostrils at bedtime as needed (for allergies.). ) 48 g 0  . gabapentin (NEURONTIN) 400 MG capsule Take 1 capsule (400 mg total) by mouth 3 (three) times daily.    Marland Kitchen levothyroxine (SYNTHROID, LEVOTHROID) 50 MCG tablet Take 1 tablet (50 mcg total) by mouth daily. (Patient taking differently: Take 50 mcg by mouth daily before breakfast. ) 90 tablet 1  . ondansetron (ZOFRAN ODT) 4 MG disintegrating tablet Take 1 tablet (4 mg total) by mouth every 8 (eight) hours as needed. 10 tablet 0  . QUEtiapine (SEROQUEL) 50 MG tablet Take 1 tablet (50 mg total) by mouth at bedtime.    . sucralfate (CARAFATE) 1 g tablet TAKE 1 TABLET FOUR TIMES DAILY WITH MEALS AND AT BEDTIME for heartburn, indigestion, and abdominal pain 360 tablet 1  . triamcinolone cream (KENALOG) 0.1 % Apply 1 application topically 4 (four) times daily. For itching rash (Patient taking  differently: Apply 1 application topically 4 (four) times daily as needed (for itchy rash). For itching rash) 453.6 g 0  . furosemide (LASIX) 40 MG tablet Take 1 tablet (40 mg total) by mouth daily. 90 tablet 1  . Potassium Chloride ER 20 MEQ TBCR Take 1 tablet by mouth daily. With lasix 90 tablet 1    Musculoskeletal: Strength & Muscle Tone: within normal limits Gait & Station: normal Patient leans: N/A  Psychiatric Specialty Exam: Physical Exam  Constitutional: She appears well-developed and well-nourished.  HENT:  Head: Normocephalic.  Respiratory: Effort normal.  Musculoskeletal: Normal range of motion.  Neurological: She is alert.  Psychiatric: Her speech is normal and behavior is normal. Thought content normal. Her mood appears anxious. Cognition and memory are impaired. She expresses impulsivity. She exhibits a depressed mood.    Review of Systems  Psychiatric/Behavioral: Positive for depression and memory loss. Negative for hallucinations, substance abuse and suicidal ideas. The patient is nervous/anxious. The patient does not have insomnia.   All other systems reviewed and are negative.   Blood pressure 112/70, pulse 70, temperature 97.7 F (36.5 C), temperature source Oral, resp. rate 16, SpO2 97 %.There is no height or weight on file to calculate BMI.  General Appearance: Casual  Eye Contact:  Good  Speech:  Slow  Volume:  Decreased  Mood:  Anxious and Depressed  Affect:  Congruent and Depressed  Thought Process:  Disorganized  Orientation:  Full (Time, Place, and Person)  Thought Content:  Logical  Suicidal Thoughts:  No  Homicidal Thoughts:  No  Memory:  Immediate;   Fair Recent;   Fair Remote;   Fair  Judgement:  Impaired  Insight:  Lacking  Psychomotor Activity:  Decreased  Concentration:  Concentration: Fair and Attention Span: Fair  Recall:  [employer] of Knowledge:  Good  Language:  Good  Akathisia:  No  Handed:  Right  AIMS (if indicated):      Assets:  Agricultural consultant Housing Intimacy  ADL's:  Intact  Cognition:  WNL  Sleep:        Treatment Plan Summary: Daily contact with patient to assess and evaluate symptoms and progress in treatment and Medication management (see MAR)  Disposition: Recommend psychiatric Inpatient admission when medically cleared. TTS to seek placement  Ethelene Hal, NP 07/03/2017  1:05 PM  Patient seen face-to-face for psychiatric evaluation, chart reviewed and case discussed with the physician extender and developed treatment plan. Reviewed the information documented and agree with the treatment plan. Corena Pilgrim, MD

## 2017-07-03 NOTE — ED Notes (Signed)
Pt behavior cooperative, pleasant on approach. Pt complaint with medication regimen. Pt reports poor sleep and  Pt endorsing passive visual hallucinations. Encourgement and support provided. Special checks q 15 mins in place for safety, Video monitoring in place. Will continue to monitor.

## 2017-07-03 NOTE — ED Notes (Signed)
Please keep pt spouse Girtha Rm(Mark Rozeboom) informed per pt's verbal consent, at [phone].

## 2017-07-03 NOTE — ED Notes (Signed)
Pt husband at bedside visiting with pt.  

## 2017-07-04 ENCOUNTER — Emergency Department (HOSPITAL_COMMUNITY): Payer: Medicare HMO

## 2017-07-04 DIAGNOSIS — G8929 Other chronic pain: Secondary | ICD-10-CM | POA: Diagnosis not present

## 2017-07-04 DIAGNOSIS — E039 Hypothyroidism, unspecified: Secondary | ICD-10-CM | POA: Diagnosis not present

## 2017-07-04 DIAGNOSIS — I130 Hypertensive heart and chronic kidney disease with heart failure and stage 1 through stage 4 chronic kidney disease, or unspecified chronic kidney disease: Secondary | ICD-10-CM | POA: Diagnosis not present

## 2017-07-04 DIAGNOSIS — M549 Dorsalgia, unspecified: Secondary | ICD-10-CM | POA: Diagnosis not present

## 2017-07-04 DIAGNOSIS — K589 Irritable bowel syndrome without diarrhea: Secondary | ICD-10-CM | POA: Diagnosis not present

## 2017-07-04 DIAGNOSIS — F05 Delirium due to known physiological condition: Secondary | ICD-10-CM | POA: Diagnosis not present

## 2017-07-04 DIAGNOSIS — I5030 Unspecified diastolic (congestive) heart failure: Secondary | ICD-10-CM | POA: Diagnosis not present

## 2017-07-04 DIAGNOSIS — R55 Syncope and collapse: Secondary | ICD-10-CM | POA: Diagnosis not present

## 2017-07-04 DIAGNOSIS — I509 Heart failure, unspecified: Secondary | ICD-10-CM | POA: Diagnosis not present

## 2017-07-04 DIAGNOSIS — F419 Anxiety disorder, unspecified: Secondary | ICD-10-CM | POA: Diagnosis not present

## 2017-07-04 DIAGNOSIS — G3184 Mild cognitive impairment, so stated: Secondary | ICD-10-CM | POA: Diagnosis not present

## 2017-07-04 DIAGNOSIS — R45851 Suicidal ideations: Secondary | ICD-10-CM | POA: Diagnosis not present

## 2017-07-04 DIAGNOSIS — Z79899 Other long term (current) drug therapy: Secondary | ICD-10-CM | POA: Diagnosis not present

## 2017-07-04 DIAGNOSIS — N189 Chronic kidney disease, unspecified: Secondary | ICD-10-CM | POA: Diagnosis not present

## 2017-07-04 DIAGNOSIS — F333 Major depressive disorder, recurrent, severe with psychotic symptoms: Secondary | ICD-10-CM | POA: Diagnosis not present

## 2017-07-04 DIAGNOSIS — F331 Major depressive disorder, recurrent, moderate: Secondary | ICD-10-CM | POA: Diagnosis not present

## 2017-07-04 DIAGNOSIS — K219 Gastro-esophageal reflux disease without esophagitis: Secondary | ICD-10-CM | POA: Diagnosis not present

## 2017-07-04 LAB — CBC WITH DIFFERENTIAL/PLATELET
BASOS PCT: 1 %
Basophils Absolute: 0.1 10*3/uL (ref 0.0–0.1)
EOS PCT: 3 %
Eosinophils Absolute: 0.3 10*3/uL (ref 0.0–0.7)
HEMATOCRIT: 42.6 % (ref 36.0–46.0)
Hemoglobin: 13.9 g/dL (ref 12.0–15.0)
LYMPHS ABS: 3.1 10*3/uL (ref 0.7–4.0)
Lymphocytes Relative: 32 %
MCH: 25.3 pg — AB (ref 26.0–34.0)
MCHC: 32.6 g/dL (ref 30.0–36.0)
MCV: 77.6 fL — AB (ref 78.0–100.0)
MONO ABS: 0.4 10*3/uL (ref 0.1–1.0)
Monocytes Relative: 4 %
NEUTROS ABS: 5.7 10*3/uL (ref 1.7–7.7)
Neutrophils Relative %: 60 %
PLATELETS: 264 10*3/uL (ref 150–400)
RBC: 5.49 MIL/uL — AB (ref 3.87–5.11)
RDW: 14.1 % (ref 11.5–15.5)
WBC: 9.6 10*3/uL (ref 4.0–10.5)

## 2017-07-04 LAB — I-STAT TROPONIN, ED: Troponin i, poc: 0 ng/mL (ref 0.00–0.08)

## 2017-07-04 LAB — COMPREHENSIVE METABOLIC PANEL
ALBUMIN: 4.4 g/dL (ref 3.5–5.0)
ALT: 32 U/L (ref 14–54)
AST: 26 U/L (ref 15–41)
Alkaline Phosphatase: 75 U/L (ref 38–126)
Anion gap: 13 (ref 5–15)
BUN: 16 mg/dL (ref 6–20)
CHLORIDE: 100 mmol/L — AB (ref 101–111)
CO2: 27 mmol/L (ref 22–32)
CREATININE: 1.43 mg/dL — AB (ref 0.44–1.00)
Calcium: 9.5 mg/dL (ref 8.9–10.3)
GFR calc Af Amer: 44 mL/min — ABNORMAL LOW (ref >60)
GFR, EST NON AFRICAN AMERICAN: 38 mL/min — AB (ref >60)
GLUCOSE: 104 mg/dL — AB (ref 65–99)
Potassium: 3.5 mmol/L (ref 3.5–5.1)
Sodium: 140 mmol/L (ref 135–145)
Total Bilirubin: 0.9 mg/dL (ref 0.3–1.2)
Total Protein: 7.5 g/dL (ref 6.5–8.1)

## 2017-07-04 LAB — I-STAT CG4 LACTIC ACID, ED: Lactic Acid, Venous: 1.07 mmol/L (ref 0.5–1.9)

## 2017-07-04 MED ORDER — AMMONIA AROMATIC IN INHA: 0.3000 mL | Freq: Once | RESPIRATORY_TRACT | Status: DC

## 2017-07-04 NOTE — ED Provider Notes (Signed)
1:48 AM She was observed to have a syncopal episode while in the psychiatric holding area.  She had gotten up and asked if there is any change to her medications because she was feeling funny, and collapsed.  On exam, she is unresponsive to verbal or painful stimuli.  Pupils are 6 mm and reactive.  Lungs are clear and heart has regular rate and rhythm.  There is trace edema.  Cardiac monitor shows sinus rhythm with adequate pulse oximetry.  The workup initiated for syncope.   5:47 AM ED workup is unremarkable.  CT of head was unremarkable and labs are unremarkable.  Patient was observed and did wake up.  On review of her past records, she does have history of conversion disorder and nonepileptic seizures.  At this point, I feel she is safe to return to the psychiatric holding area and to be transferred to a geriatric psychiatric facility for psychiatric treatment.   EKG Interpretation  Date/Time:  Monday July 04 2017 01:44:07 EST Ventricular Rate:  77 PR Interval:    QRS Duration: 105 QT Interval:  437 QTC Calculation: 495 R Axis:   52 Text Interpretation:  Sinus rhythm Abnormal R-wave progression, early transition Borderline prolonged QT interval Nonspecific T wave abnormality When compared with ECG of 06/23/2017, No significant change was found Confirmed by Dione BoozeGlick, Glendell Fouse ([id number]) on 07/04/2017 1:53:03 AM         Dione BoozeGlick, Floyce Bujak, MD 07/04/17 [phone]

## 2017-07-04 NOTE — ED Notes (Signed)
Pt came to desk and reported she feels"funnyPatent attorney" writer reported which medications changed and pt went to rest room unrestricted with normal gait. Pt used facilities briefly and began walking to her room. Pt stepped 2 steps out of restroom and fell to the floor. Vs assessed WNL but pt unresponsive to nail bed pressure or sternal rub. Called charge nurse for assistance.

## 2017-07-04 NOTE — Progress Notes (Signed)
Theo called to report the pt has been accepted to [facility], bed is available for the next 24 hours, accepted Lyndel Pleasureanielle Partin, [place]. Call to report [phone]. Darel HongJudy, RN advised of acceptance.  Princess BruinsAquicha Azadeh Hyder, MSW, LCSW Therapeutic Triage Specialist  [phone]

## 2017-07-04 NOTE — ED Notes (Signed)
[facility] transported [facility] by [facility]. Pt did not have any belongings. She was calm and cooperative and voluntary.

## 2017-07-04 NOTE — ED Notes (Signed)
Bed: [id number] Expected date:  Expected time:  Means of arrival:  Comments: Brooke DareKing

## 2017-07-05 ENCOUNTER — Telehealth: Payer: Self-pay | Admitting: Family Medicine

## 2017-07-05 NOTE — Telephone Encounter (Signed)
Pt at [facility] in [place] - called and left message w/ [facility] medical team w/ my cell phone offering to help provide info on medical/psych hx with prior med trials.  Advised that she can reestablish with [place]pt psych in [place] Dr. Neysa Hottereina Hisada - last OV there 09/2016 but that I had been rxing her psych meds so would like to make sure that prior to pt's d/c, I am able to discuss the recs for immediate med management after d/c w/ her inpt treating psychiatrist as pt is more likely to present to me for f/u

## 2017-07-06 NOTE — Telephone Encounter (Signed)
Call from [facility]. Doing well. Plan to d/c her on Fri and arranging o/p f/u with psych.

## 2017-07-08 NOTE — Telephone Encounter (Signed)
[facility] called - Jennifer Davidson was d/c'd today. They have her set up for the following: 2/26 Tuesday am starting [facility] behavioral program 2/28 at 2:30 w/ Dr. Vanetta ShawlHisada at [place] though [place] pysch office did note that this appointment may need to be rescheduled due to provider's schedule.

## 2017-07-12 ENCOUNTER — Other Ambulatory Visit: Payer: Self-pay | Admitting: Family Medicine

## 2017-07-12 MED ORDER — ONDANSETRON 8 MG PO TBDP: 4.0000 mg | ORAL_TABLET | Freq: Three times a day (TID) | ORAL | 2 refills | Status: DC | PRN

## 2017-07-14 ENCOUNTER — Ambulatory Visit (HOSPITAL_COMMUNITY): Payer: Self-pay | Admitting: Psychiatry

## 2017-07-16 ENCOUNTER — Other Ambulatory Visit: Payer: Self-pay

## 2017-07-16 ENCOUNTER — Ambulatory Visit (INDEPENDENT_AMBULATORY_CARE_PROVIDER_SITE_OTHER): Payer: Medicare HMO | Admitting: Family Medicine

## 2017-07-16 ENCOUNTER — Encounter: Payer: Self-pay | Admitting: Family Medicine

## 2017-07-16 VITALS — BP 126/88 | HR 96 | Temp 98.0°F | Resp 16 | Ht 62.75 in | Wt 207.4 lb

## 2017-07-16 DIAGNOSIS — K219 Gastro-esophageal reflux disease without esophagitis: Secondary | ICD-10-CM

## 2017-07-16 DIAGNOSIS — M899 Disorder of bone, unspecified: Secondary | ICD-10-CM

## 2017-07-16 DIAGNOSIS — R410 Disorientation, unspecified: Secondary | ICD-10-CM

## 2017-07-16 DIAGNOSIS — K580 Irritable bowel syndrome with diarrhea: Secondary | ICD-10-CM

## 2017-07-16 DIAGNOSIS — F05 Delirium due to known physiological condition: Secondary | ICD-10-CM

## 2017-07-16 DIAGNOSIS — F333 Major depressive disorder, recurrent, severe with psychotic symptoms: Secondary | ICD-10-CM

## 2017-07-16 DIAGNOSIS — E860 Dehydration: Secondary | ICD-10-CM

## 2017-07-16 DIAGNOSIS — N390 Urinary tract infection, site not specified: Secondary | ICD-10-CM

## 2017-07-16 DIAGNOSIS — R718 Other abnormality of red blood cells: Secondary | ICD-10-CM | POA: Diagnosis not present

## 2017-07-16 DIAGNOSIS — R112 Nausea with vomiting, unspecified: Secondary | ICD-10-CM | POA: Diagnosis not present

## 2017-07-16 LAB — POCT URINALYSIS DIP (MANUAL ENTRY)
Bilirubin, UA: NEGATIVE
GLUCOSE UA: NEGATIVE mg/dL
Ketones, POC UA: NEGATIVE mg/dL
Leukocytes, UA: NEGATIVE
NITRITE UA: NEGATIVE
PROTEIN UA: NEGATIVE mg/dL
RBC UA: NEGATIVE
Spec Grav, UA: 1.02 (ref 1.010–1.025)
UROBILINOGEN UA: 0.2 U/dL
pH, UA: 5.5 (ref 5.0–8.0)

## 2017-07-16 LAB — POC MICROSCOPIC URINALYSIS (UMFC): Mucus: ABSENT

## 2017-07-16 MED ORDER — BUTALBITAL-APAP-CAFFEINE 50-325-40 MG PO CAPS: 2.0000 | ORAL_CAPSULE | ORAL | 0 refills | Status: DC | PRN

## 2017-07-16 MED ORDER — GABAPENTIN 600 MG PO TABS: 600.0000 mg | ORAL_TABLET | Freq: Three times a day (TID) | ORAL | 0 refills | Status: DC

## 2017-07-16 MED ORDER — QUETIAPINE FUMARATE 100 MG PO TABS: 100.0000 mg | ORAL_TABLET | Freq: Every day | ORAL | Status: DC

## 2017-07-16 MED ORDER — DULOXETINE HCL 60 MG PO CPEP: 60.0000 mg | ORAL_CAPSULE | Freq: Every day | ORAL | Status: DC

## 2017-07-16 MED ORDER — RIZATRIPTAN BENZOATE 10 MG PO TBDP: 10.0000 mg | ORAL_TABLET | ORAL | 0 refills | Status: DC | PRN

## 2017-07-16 MED ORDER — CIMETIDINE 400 MG PO TABS: 800.0000 mg | ORAL_TABLET | Freq: Two times a day (BID) | ORAL | 0 refills | Status: DC

## 2017-07-16 NOTE — Patient Instructions (Signed)
Gastritis, Adult Gastritis is inflammation of the stomach. There are two kinds of gastritis:  Acute gastritis. This kind develops suddenly.  Chronic gastritis. This kind lasts for a long time.  Gastritis happens when the lining of the stomach becomes weak or gets damaged. Without treatment, gastritis can lead to stomach bleeding and ulcers. What are the causes? This condition may be caused by:  An infection.  Drinking too much alcohol.  Certain medicines.  Having too much acid in the stomach.  A disease of the intestines or stomach.  Stress.  What are the signs or symptoms? Symptoms of this condition include:  Pain or a burning in the upper abdomen.  Nausea.  Vomiting.  An uncomfortable feeling of fullness after eating.  In some cases, there are no symptoms. How is this diagnosed? This condition may be diagnosed with:  A description of your symptoms.  A physical exam.  Tests. These can include: ? Blood tests. ? Stool tests. ? A test in which a thin, flexible instrument with a light and camera on the end is passed down the esophagus and into the stomach (upper endoscopy). ? A test in which a sample of tissue is taken for testing (biopsy).  How is this treated? This condition may be treated with medicines. If the condition is caused by a bacterial infection, you may be given antibiotic medicines. If it is caused by too much acid in the stomach, you may get medicines called H2 blockers, proton pump inhibitors, or antacids. Treatment may also involve stopping the use of certain medicines, such as aspirin, ibuprofen, or other nonsteroidal anti-inflammatory drugs (NSAIDs). Follow these instructions at home:  Take over-the-counter and prescription medicines only as told by your health care provider.  If you were prescribed an antibiotic, take it as told by your health care provider. Do not stop taking the antibiotic even if you start to feel better.  Drink enough  fluid to keep your urine clear or pale yellow.  Eat small, frequent meals instead of large meals. Contact a health care provider if:  Your symptoms get worse.  Your symptoms return after treatment. Get help right away if:  You vomit blood or material that looks like coffee grounds.  You have black or dark red stools.  You are unable to keep fluids down.  Your abdominal pain gets worse.  You have a fever.  You do not feel better after 1 week. This information is not intended to replace advice given to you by your health care provider. Make sure you discuss any questions you have with your health care provider. Document Released: 04/27/2001 Document Revised: 12/31/2015 Document Reviewed: 01/25/2015 Elsevier Interactive Patient Education  2018 Elsevier Inc.  

## 2017-07-16 NOTE — Progress Notes (Addendum)
Subjective:  By signing my name below, I, Stann Ore, attest that this documentation has been prepared under the direction and in the presence of Norberto Sorenson, MD. Electronically Signed: Stann Ore, Scribe. 07/16/2017 , 9:53 AM .  Patient was seen in Room 3 .   Patient ID: Jennifer Davidson, female    DOB: 04/10/1956, 62 y.o.   MRN: [id number] Chief Complaint  Patient presents with  . Follow-up    from 07/02/17 [facility], "Depression", per husband still anxious, and agitated" but alot better than she was  . Headache    started x 3 days ago, "all the time in the back of head" Excedrin helps most of the time, but it hasn't helped this time   HPI LOVINA ZUVER is a 62 y.o. female who presents to Primary Care at [facility] for hospital follow up. Patient was in [place] in [place] from 2/18 through 2/22. I contacted [facility] during her hospitalization and offered to provide her medication history with prior things she's tried and failed; however, they never requested for me to provide this information. They arranged for her to be followed up with psychiatry, Dr. Vanetta Shawl, after her discharge on 2/22. However, Dr. Bing Matter office rescheduled this for 07/22/17. Patient was also scheduled to begin the [facility] on 2/26, which also appears to not have happened.   Patient was on Cymbalta 60mg  which she has been on for the past 2.5 years, but due to worsening mental status and symptoms, this had been decreased to Cymbalta 30mg  in Nov during her inpatient hospitalization at [facility]. The psych team at [facility] increased her dose back to 60mg . She has been on Gabapentin 400mg  TID for numerous years, which was increased to 500mg  TID. She had been on Seroquel QHS for the past year, started at 25mg , increased to 50mg  9 months ago. This was increased to 100mg . [facility] had stopped her alprazolam completely and advised her to use hydroxyzine 25mg  PRN for restlessness. Prior to patient's  hospitalization, she had been prescribed 100mg  to 200mg  of hydroxyzine at night. In summary, her Cymbalta and Seroquel were doubled, which was a regime she had been on prior. And her xanax 0.5mg  QHS was discontinued.   Patient states planning to attend group therapy all week, starting this coming Monday (3/4). She initially couldn't sleep, and was becoming really agitated throughout the day, so she's been taking hydroxyzine. She's been vomiting, but this had improved (last spell on Wednesday, 2/27). She denies any blood in vomit. As the week progressed, her anxiety seemed to have improved. She was having some sinus problems last week, and she's been having headache in the back of her head. She believes vomiting due to anxiety; denies cramping with vomiting. She had restarted Nexium with relief. She doesn't recall seeing GI. She mentions feeling nauseated when riding in the car. She believes feeling anxious about seeing Dr. Vanetta Shawl next week. Her husband notes patient has been "jerky" and would jerk around when laying in bed. She's been able to sleep more. She reports her appetite has decreased and thought of eating makes her feel sick. She denies leg swelling or diarrhea. She also takes supplement with black cohosh and soy. She's been trying to walk a lot; improved back pain. She's been wearing her cpap at night.   Past Medical History:  Diagnosis Date  . Anxiety   . Cholesterol serum increased   . Chronic back pain    chronic Rt low back pain.  s/p L4-5 fusion. failed Rt facet injections. poss due to Rt SI joint dysfunction.  . Chronic kidney disease   . Depression   . Diastolic heart failure (HCC)   . GERD (gastroesophageal reflux disease)   . Hyperparathyroidism (HCC)   . Hypertension   . Hypothyroidism   . Migraine   . Neuromuscular disorder (HCC)   . Sleep apnea   . Tachycardia   . Trochanteric bursitis of both hips 2012   Confirmed on MRI   Past Surgical History:  Procedure Laterality  Date  . ABDOMINAL HYSTERECTOMY    . APPENDECTOMY    . BACK SURGERY     L4-5 fusion  . KNEE SURGERY     Prior to Admission medications   Medication Sig Start Date End Date Taking? Authorizing Provider  azelastine (ASTELIN) 0.1 % nasal spray Place 1 spray into both nostrils 2 (two) times daily. Use in each nostril as directed Patient taking differently: Place 1 spray into both nostrils 2 (two) times daily as needed (for allergies.). Use in each nostril as directed 09/09/16  Yes [name], MD  cholestyramine Lanetta Inch) 4 GM/DOSE powder Take 1 packet (4 g total) by mouth 3 (three) times daily with meals. As needed for diarrhea Patient taking differently: Take 4 g by mouth 3 (three) times daily as needed (for diarrhea.). As needed for diarrhea 04/25/17  Yes [name], MD  diclofenac sodium (VOLTAREN) 1 % GEL Apply 2 g topically daily as needed (for knee pain).   Yes [provider]  DULoxetine (CYMBALTA) 30 MG capsule Take 1 capsule (30 mg total) by mouth at bedtime. 04/25/17  Yes [name], MD  famotidine (PEPCID) 20 MG tablet Take 1 tablet (20 mg total) by mouth 2 (two) times daily. 06/24/17  Yes Vassie Loll, MD  fluticasone [facility]) 50 MCG/ACT nasal spray Place 2 sprays at bedtime into both nostrils. Patient taking differently: Place 2 sprays into both nostrils at bedtime as needed (for allergies.).  03/25/17  Yes [name], MD  gabapentin (NEURONTIN) 400 MG capsule Take 1 capsule (400 mg total) by mouth 3 (three) times daily. Patient taking differently: Take by mouth 3 (three) times daily.  06/24/17  Yes Vassie Loll, MD  levothyroxine (SYNTHROID, LEVOTHROID) 50 MCG tablet Take 1 tablet (50 mcg total) by mouth daily. Patient taking differently: Take 50 mcg by mouth daily before breakfast.  04/25/17  Yes [name], MD  ondansetron (ZOFRAN-ODT) 8 MG disintegrating tablet Take 0.5-1 tablets (4-8 mg total) by mouth every 8 (eight) hours as needed for nausea. 07/12/17  Yes [name], MD  QUEtiapine (SEROQUEL) 50 MG tablet Take 1 tablet (50 mg total) by mouth at bedtime. 04/10/17  Yes Kathlen Mody, MD  triamcinolone cream (KENALOG) 0.1 % Apply 1 application topically 4 (four) times daily. For itching rash Patient taking differently: Apply 1 application topically 4 (four) times daily as needed (for itchy rash). For itching rash 12/16/16  Yes [name], MD  ALPRAZolam Prudy Feeler) 0.5 MG tablet Take 1 tablet (0.5 mg total) by mouth at bedtime. Patient not taking: Reported on 07/16/2017 04/25/17   [name], MD  Calcium-Magnesium-Vitamin D (CALCIUM 1200+D3 PO) Take 1 tablet by mouth every morning. Contains 800iu Vitamin D3     [provider]  cholecalciferol (VITAMIN D) 1000 UNITS tablet Take 1 tablet (1,000 Units total) by mouth daily at 12 noon. Patient not taking: Reported on 07/16/2017 12/05/14   Adonis Brook, NP  esomeprazole (NEXIUM) 40 MG capsule TAKE 1 CAPSULE (40 MG TOTAL) BY MOUTH DAILY. 12/09/16   [name], MD  furosemide (LASIX) 40 MG tablet Take 1 tablet (40 mg total) by mouth daily. Patient not taking: Reported on 07/16/2017 12/09/16   [name], MD  Potassium Chloride ER 20 MEQ TBCR Take 1 tablet by mouth daily. With lasix Patient not taking: Reported on 07/16/2017 12/09/16   [name], MD  sucralfate (CARAFATE) 1 g tablet TAKE 1 TABLET FOUR TIMES DAILY WITH MEALS AND AT BEDTIME for heartburn, indigestion, and abdominal pain Patient not taking: Reported on 07/16/2017 02/19/17   [name], MD   Allergies  Allergen Reactions  . Tramadol Nausea And Vomiting  . Trazodone And Nefazodone Other (See Comments)    Per pt trazodone caused hallucinations and behavior changes    Family History  Problem Relation Age of Onset  . Diabetes Mother   . Heart disease Mother   . Kidney disease Mother   . Thyroid disease Mother   . Heart disease Father   . Seizures Father   . COPD Father   . Cancer Maternal Grandmother    Social History   Socioeconomic History    . Marital status: Married    Spouse name: None  . Number of children: 3  . Years of education: 35  . Highest education level: Some college, no degree  Social Needs  . Financial resource strain: Not hard at all  . Food insecurity - worry: Never true  . Food insecurity - inability: Never true  . Transportation needs - medical: No  . Transportation needs - non-medical: No  Occupational History  . None  Tobacco Use  . Smoking status: Never Smoker  . Smokeless tobacco: Never Used  Substance and Sexual Activity  . Alcohol use: No  . Drug use: No    Comment: 08-31-2016 PER PT NO   . Sexual activity: Yes    Partners: Male    Comment: married  Other Topics Concern  . None  Social History Narrative  . None   Depression screen [facility] 2/9 07/16/2017 07/02/2017 04/18/2017 04/18/2017 02/19/2017  Decreased Interest 1 3 0 3 0  Down, Depressed, Hopeless 0 3 0 3 0  PHQ - 2 Score 1 6 0 6 0  Altered sleeping - 3 - 3 -  Tired, decreased energy - 3 - 3 -  Change in appetite - 0 - 1 -  Feeling bad or failure about yourself  - 3 - 1 -  Trouble concentrating - 3 - 3 -  Moving slowly or fidgety/restless - 3 - 0 -  Suicidal thoughts - 3 - 0 -  PHQ-9 Score - 24 - 17 -  Difficult doing work/chores - Extremely dIfficult - Very difficult -  Some recent data might be hidden    Review of Systems  Constitutional: Positive for activity change and appetite change. Negative for fatigue.  Cardiovascular: Negative for leg swelling.  Gastrointestinal: Positive for nausea and vomiting (improved). Negative for diarrhea.  Neurological: Positive for headaches.  Psychiatric/Behavioral: Positive for agitation and sleep disturbance. Negative for hallucinations. The patient is nervous/anxious.        Objective:   Physical Exam  Constitutional: She is oriented to person, place, and time. She appears well-developed and well-nourished. No distress.  HENT:  Head: Normocephalic and atraumatic.  Eyes: EOM are normal.  Pupils are equal, round, and reactive to light.  Neck: Neck supple.  Cardiovascular: Normal rate.  Pulmonary/Chest: Effort normal. No respiratory distress.  Musculoskeletal: Normal range of motion.  Neurological: She is alert and oriented to person, place, and time.  Skin: Skin is warm and dry.  Psychiatric: She has a normal mood and affect. Her behavior is normal.  Nursing note and vitals reviewed.   BP 126/88   Pulse 96   Temp 98 F (36.7 C) (Oral)   Resp 16   Ht 5' 2.75" (1.594 m)   Wt 207 lb 6.4 oz (94.1 kg)   SpO2 99%   BMI 37.03 kg/m      Assessment & Plan:   1. Severe recurrent major depressive disorder with psychotic features (HCC)   2. Recurrent UTI   3. Subacute confusional state   4. Irritable bowel syndrome with diarrhea   5. Dehydration   6. Non-intractable vomiting with nausea, unspecified vomiting type   7. Microcytosis   8. Disorder of bone   9. Gastroesophageal reflux disease, esophagitis presence not specified     Orders Placed This Encounter  Procedures  . CBC with Differential/Platelet  . Comprehensive metabolic panel  . Lipase  . Ferritin  . VITAMIN D 25 Hydroxy (Vit-D Deficiency, Fractures)  . Ambulatory referral to Gastroenterology    Referral Priority:   Routine    Referral Type:   Consultation    Referral Reason:   Specialty Services Required    Referred to Provider:   Sherrilyn Rist, MD    Number of Visits Requested:   1  . POCT urinalysis dipstick  . POCT Microscopic Urinalysis ([facility])    Meds ordered this encounter  Medications  . Butalbital-APAP-Caffeine 50-325-40 MG capsule    Sig: Take 2 capsules by mouth every 4 (four) hours as needed for headache.    Dispense:  15 capsule    Refill:  0  . QUEtiapine (SEROQUEL) 100 MG tablet    Sig: Take 1 tablet (100 mg total) by mouth at bedtime.  Marland Kitchen DISCONTD: DULoxetine (CYMBALTA) 60 MG capsule    Sig: Take 1 capsule (60 mg total) by mouth at bedtime.  Marland Kitchen DISCONTD: gabapentin  (NEURONTIN) 600 MG tablet    Sig: Take 1 tablet (600 mg total) by mouth 3 (three) times daily.    Dispense:  90 tablet    Refill:  0  . gabapentin (NEURONTIN) 600 MG tablet    Sig: Take 1 tablet (600 mg total) by mouth 3 (three) times daily.    Dispense:  270 tablet    Refill:  0  . cimetidine (TAGAMET) 400 MG tablet    Sig: Take 2 tablets (800 mg total) by mouth 2 (two) times daily.    Dispense:  360 tablet    Refill:  0  . rizatriptan (MAXALT-MLT) 10 MG disintegrating tablet    Sig: Take 1 tablet (10 mg total) by mouth as needed for migraine. May repeat in 2 hours if needed    Dispense:  30 tablet    Refill:  0  . DULoxetine (CYMBALTA) 60 MG capsule    Sig: Take 1 capsule (60 mg total) by mouth daily.    Dispense:  30 capsule    Refill:  0  . hydrOXYzine (VISTARIL) 50 MG capsule    Sig: Take 1 capsule (50 mg total) by mouth every 6 (six) hours as needed for anxiety.    Dispense:  120 capsule    Refill:  0    I personally performed the services described in this documentation, which was scribed in  my presence. The recorded information has been reviewed and considered, and addended by me as needed.   Norberto SorensonEva Shaw, M.D.  Primary Care at [facility]  [place] [address] [place], [place] [id number] [phone] phone [phone] fax  07/17/17 10:00 AM

## 2017-07-18 LAB — CBC WITH DIFFERENTIAL/PLATELET
Basophils Absolute: 0 10*3/uL (ref 0.0–0.2)
Basos: 1 %
EOS (ABSOLUTE): 0.2 10*3/uL (ref 0.0–0.4)
Eos: 3 %
Hematocrit: 41.5 % (ref 34.0–46.6)
Hemoglobin: 13 g/dL (ref 11.1–15.9)
Immature Grans (Abs): 0 10*3/uL (ref 0.0–0.1)
Immature Granulocytes: 0 %
LYMPHS ABS: 2 10*3/uL (ref 0.7–3.1)
Lymphs: 31 %
MCH: 24.6 pg — ABNORMAL LOW (ref 26.6–33.0)
MCHC: 31.3 g/dL — AB (ref 31.5–35.7)
MCV: 78 fL — ABNORMAL LOW (ref 79–97)
MONOS ABS: 0.5 10*3/uL (ref 0.1–0.9)
Monocytes: 8 %
Neutrophils Absolute: 3.8 10*3/uL (ref 1.4–7.0)
Neutrophils: 57 %
Platelets: 275 10*3/uL (ref 150–379)
RBC: 5.29 x10E6/uL — AB (ref 3.77–5.28)
RDW: 15.5 % — AB (ref 12.3–15.4)
WBC: 6.5 10*3/uL (ref 3.4–10.8)

## 2017-07-18 LAB — COMPREHENSIVE METABOLIC PANEL
ALK PHOS: 89 IU/L (ref 39–117)
ALT: 31 IU/L (ref 0–32)
AST: 23 IU/L (ref 0–40)
Albumin/Globulin Ratio: 1.6 (ref 1.2–2.2)
Albumin: 4.5 g/dL (ref 3.6–4.8)
BUN/Creatinine Ratio: 7 — ABNORMAL LOW (ref 12–28)
BUN: 11 mg/dL (ref 8–27)
Bilirubin Total: 0.5 mg/dL (ref 0.0–1.2)
CO2: 23 mmol/L (ref 20–29)
CREATININE: 1.59 mg/dL — AB (ref 0.57–1.00)
Calcium: 9.6 mg/dL (ref 8.7–10.3)
Chloride: 104 mmol/L (ref 96–106)
GFR calc Af Amer: 40 mL/min/{1.73_m2} — ABNORMAL LOW (ref >59)
GFR calc non Af Amer: 34 mL/min/{1.73_m2} — ABNORMAL LOW (ref >59)
Globulin, Total: 2.8 g/dL (ref 1.5–4.5)
Glucose: 106 mg/dL — ABNORMAL HIGH (ref 65–99)
Potassium: 4.4 mmol/L (ref 3.5–5.2)
SODIUM: 143 mmol/L (ref 134–144)
Total Protein: 7.3 g/dL (ref 6.0–8.5)

## 2017-07-18 LAB — LIPASE: LIPASE: 22 U/L (ref 14–72)

## 2017-07-18 LAB — VITAMIN D 25 HYDROXY (VIT D DEFICIENCY, FRACTURES): Vit D, 25-Hydroxy: 38.2 ng/mL (ref 30.0–100.0)

## 2017-07-18 LAB — FERRITIN: FERRITIN: 55 ng/mL (ref 15–150)

## 2017-07-18 MED ORDER — DULOXETINE HCL 60 MG PO CPEP: 60.0000 mg | ORAL_CAPSULE | Freq: Every day | ORAL | 0 refills | Status: DC

## 2017-07-18 MED ORDER — HYDROXYZINE PAMOATE 50 MG PO CAPS: 50.0000 mg | ORAL_CAPSULE | Freq: Four times a day (QID) | ORAL | 0 refills | Status: DC | PRN

## 2017-07-18 NOTE — Addendum Note (Signed)
Addended by: Sherren MochaSHAW,  N on: 07/18/2017 07:53 PM   Modules accepted: Orders
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
## 2017-07-20 ENCOUNTER — Other Ambulatory Visit: Payer: Self-pay | Admitting: Family Medicine

## 2017-07-22 ENCOUNTER — Encounter (HOSPITAL_COMMUNITY): Payer: Self-pay | Admitting: Psychiatry

## 2017-07-22 ENCOUNTER — Ambulatory Visit (HOSPITAL_COMMUNITY): Payer: Medicare HMO | Admitting: Psychiatry

## 2017-07-22 ENCOUNTER — Encounter: Payer: Self-pay | Admitting: Gastroenterology

## 2017-07-22 VITALS — BP 107/74 | HR 100 | Ht 62.75 in | Wt 205.0 lb

## 2017-07-22 DIAGNOSIS — F331 Major depressive disorder, recurrent, moderate: Secondary | ICD-10-CM

## 2017-07-22 DIAGNOSIS — G4700 Insomnia, unspecified: Secondary | ICD-10-CM | POA: Diagnosis not present

## 2017-07-22 DIAGNOSIS — F419 Anxiety disorder, unspecified: Secondary | ICD-10-CM

## 2017-07-22 DIAGNOSIS — F4310 Post-traumatic stress disorder, unspecified: Secondary | ICD-10-CM

## 2017-07-22 DIAGNOSIS — R450 Nervousness: Secondary | ICD-10-CM | POA: Diagnosis not present

## 2017-07-22 MED ORDER — QUETIAPINE FUMARATE 100 MG PO TABS: 100.0000 mg | ORAL_TABLET | Freq: Every day | ORAL | 0 refills | Status: DC

## 2017-07-22 MED ORDER — DULOXETINE HCL 60 MG PO CPEP: 60.0000 mg | ORAL_CAPSULE | Freq: Every day | ORAL | 0 refills | Status: DC

## 2017-07-22 NOTE — Patient Instructions (Signed)
1. Continue duloxetine 60 mg daily 2. Continue quetiapine 100 mg at night 3. Continue hydroxyzine 50 mg daily as needed for aniety  4. Referral to therapy  5. Return to clinic in six weeks for 30 mins

## 2017-07-30 ENCOUNTER — Ambulatory Visit (INDEPENDENT_AMBULATORY_CARE_PROVIDER_SITE_OTHER): Payer: Medicare HMO | Admitting: Family Medicine

## 2017-07-30 ENCOUNTER — Encounter: Payer: Self-pay | Admitting: Family Medicine

## 2017-07-30 VITALS — BP 132/85 | HR 99 | Temp 98.4°F | Resp 18 | Ht 62.0 in | Wt 205.4 lb

## 2017-07-30 DIAGNOSIS — R441 Visual hallucinations: Secondary | ICD-10-CM | POA: Diagnosis not present

## 2017-07-30 DIAGNOSIS — G43911 Migraine, unspecified, intractable, with status migrainosus: Secondary | ICD-10-CM | POA: Diagnosis not present

## 2017-07-30 DIAGNOSIS — Z79899 Other long term (current) drug therapy: Secondary | ICD-10-CM

## 2017-07-30 DIAGNOSIS — R112 Nausea with vomiting, unspecified: Secondary | ICD-10-CM | POA: Diagnosis not present

## 2017-07-30 DIAGNOSIS — F333 Major depressive disorder, recurrent, severe with psychotic symptoms: Secondary | ICD-10-CM | POA: Diagnosis not present

## 2017-07-30 DIAGNOSIS — N390 Urinary tract infection, site not specified: Secondary | ICD-10-CM

## 2017-07-30 LAB — POCT URINALYSIS DIP (MANUAL ENTRY)
BILIRUBIN UA: NEGATIVE mg/dL
Bilirubin, UA: NEGATIVE
Blood, UA: NEGATIVE
GLUCOSE UA: NEGATIVE mg/dL
LEUKOCYTES UA: NEGATIVE
Nitrite, UA: NEGATIVE
PROTEIN UA: NEGATIVE mg/dL
Spec Grav, UA: >=1.03 — AB (ref 1.010–1.025)
Urobilinogen, UA: 1 E.U./dL
pH, UA: 5.5 (ref 5.0–8.0)

## 2017-07-30 LAB — POC MICROSCOPIC URINALYSIS (UMFC): MUCUS RE: ABSENT

## 2017-07-30 MED ORDER — PROMETHAZINE HCL 25 MG/ML IJ SOLN
25.0000 mg | Freq: Once | INTRAMUSCULAR | Status: AC
Start: 2017-07-30 — End: 2017-07-30
  Administered 2017-07-30: 25 mg via INTRAMUSCULAR

## 2017-07-30 MED ORDER — DIPHENHYDRAMINE HCL 50 MG/ML IJ SOLN
50.0000 mg | Freq: Once | INTRAMUSCULAR | Status: AC
  Administered 2017-07-30: 50 mg via INTRAMUSCULAR

## 2017-07-30 MED ORDER — TOPIRAMATE 25 MG PO TABS: ORAL_TABLET | ORAL | 0 refills | Status: DC

## 2017-07-30 MED ORDER — KETOROLAC TROMETHAMINE 30 MG/ML IJ SOLN
30.0000 mg | Freq: Once | INTRAMUSCULAR | Status: AC
Start: 2017-07-30 — End: 2017-07-30
  Administered 2017-07-30: 30 mg via INTRAMUSCULAR

## 2017-07-30 MED ORDER — TOPIRAMATE 50 MG PO TABS: 50.0000 mg | ORAL_TABLET | Freq: Two times a day (BID) | ORAL | 0 refills | Status: DC

## 2017-07-30 NOTE — Patient Instructions (Addendum)
IF you received an x-ray today, you will receive an invoice from [facility]. Please contact [facility] at [phone] with questions or concerns regarding your invoice.   IF you received labwork today, you will receive an invoice from [facility]. Please contact [facility] at [phone] with questions or concerns regarding your invoice.   Our billing staff will not be able to assist you with questions regarding bills from these companies.  You will be contacted with the lab results as soon as they are available. The fastest way to get your results is to activate your My Chart account. Instructions are located on the last page of this paperwork. If you have not heard from us regarding the results in 2 weeks, please contact this office.     Recurrent Migraine Headache A migraine headache is very bad, throbbing pain that is usually on one side of your head. Recurrent migraines keep coming back (recurring). Talk with your doctor about what things may bring on (trigger) your migraine headaches. Follow these instructions at home: Medicines  Take over-the-counter and prescription medicines only as told by your doctor.  Do not drive or use heavy machinery while taking prescription pain medicine. Lifestyle  Do not use any products that contain nicotine or tobacco, such as cigarettes and e-cigarettes. If you need help quitting, ask your doctor.  Limit alcohol intake to no more than 1 drink a day for nonpregnant women and 2 drinks a day for men. One drink equals 12 oz of beer, 5 oz of wine, or 1 oz of hard liquor.  Get 7-9 hours of sleep each night.  Lessen any stress in your life. Ask your doctor about ways to lower your stress.  Stay at a healthy weight. Talk with your doctor if you need help losing weight.  Get regular exercise. General instructions  Keep a journal to find out if certain things bring on migraine headaches. For example, write down: ? What you eat  and drink. ? How much sleep you get. ? Any change to your diet or medicines.  Lie down in a dark, quiet room when you have a migraine.  Try placing a cool towel over your head when you have a migraine.  Keep lights dim if bright lights bother you or make your migraines worse.  Keep all follow-up visits as told by your doctor. This is important. Contact a doctor if:  Medicine does not help your migraines.  Your pain keeps coming back.  You have a fever.  You have weight loss without trying. Get help right away if:  Your migraine becomes really bad and medicine does not help.  You have a stiff neck.  You have trouble seeing.  Your muscles are weak or you lose control of your muscles.  You lose your balance or have trouble walking.  You feel like you will pass out (faint) or you pass out.  You have really bad symptoms that are different than your first symptoms.  You start having sudden, very bad headaches that last for one second or less, like a thunderclap. Summary  A migraine headache is very bad, throbbing pain that is usually on one side of your head.  Talk with your doctor about what things may bring on (trigger) your migraine headaches.  Take over-the-counter and prescription medicines only as told by your doctor.  Lie down in a dark, quiet room when you have a migraine.  Keep a journal about what you eat and drink, how  much sleep you get, and any changes to your medicines. This can help you find out if certain things make you have migraine headaches. This information is not intended to replace advice given to you by your health care provider. Make sure you discuss any questions you have with your health care provider. Document Released: 02/10/2008 Document Revised: 03/26/2016 Document Reviewed: 03/26/2016 Elsevier Interactive Patient Education  2017 [employer]

## 2017-07-30 NOTE — Progress Notes (Signed)
Subjective:    Patient ID: Jennifer Davidson, female    DOB: 06/09/1955, 62 y.o.   MRN: [id number] Chief Complaint  Patient presents with  . follow headache/vomiting    HPI Not seeing people, not talking to people, hallucinations have largely stopped and she seems to be doing better that way and sleeping better but now sleeping all the time as the migraine headaches have just been constant.  No relief. Abortive meds - triptan - not working at all. Can't keep anything down - everytime she gets in the car she vomits, just no appetite Has been taking hydroxyzine 50mg  4x/d as needed for anxiety and agitatation. Still taking the duloxetine 60mg  qd.   She was taken off of her topamax during [place] recently 2/18-2/22/19.  She had been on this for many yrs - probably almost a decade  Past Medical History:  Diagnosis Date  . Anxiety   . Cataract 08/31/2017   left eye  . Cholesterol serum increased   . Chronic back pain    chronic Rt low back pain. s/p L4-5 fusion. failed Rt facet injections. poss due to Rt SI joint dysfunction.  . Chronic kidney disease   . Depression   . Diastolic heart failure (HCC)   . GERD (gastroesophageal reflux disease)   . Hyperparathyroidism (HCC)   . Hypertension   . Hypothyroidism   . Migraine   . Neuromuscular disorder (HCC)   . Sleep apnea   . Tachycardia   . Trochanteric bursitis of both hips 2012   Confirmed on MRI   Past Surgical History:  Procedure Laterality Date  . ABDOMINAL HYSTERECTOMY    . APPENDECTOMY  1986  . BACK SURGERY     L4-5 fusion  . CATARACT EXTRACTION    . KNEE SURGERY     Current Outpatient Medications on File Prior to Visit  Medication Sig Dispense Refill  . cholestyramine (QUESTRAN) 4 GM/DOSE powder Take 1 packet (4 g total) by mouth 3 (three) times daily with meals. As needed for diarrhea (Patient not taking: Reported on 09/13/2017) 378 g 2  . diclofenac sodium (VOLTAREN) 1 % GEL Apply 2 g topically  daily as needed (for knee pain).    Marland Kitchen. esomeprazole (NEXIUM) 40 MG capsule TAKE 1 CAPSULE (40 MG TOTAL) BY MOUTH DAILY. 90 capsule 3  . gabapentin (NEURONTIN) 600 MG tablet Take 1 tablet (600 mg total) by mouth 3 (three) times daily. 270 tablet 0  . levothyroxine (SYNTHROID, LEVOTHROID) 50 MCG tablet Take 1 tablet (50 mcg total) by mouth daily. (Patient taking differently: Take 50 mcg by mouth daily before breakfast. ) 90 tablet 1  . ondansetron (ZOFRAN-ODT) 8 MG disintegrating tablet Take 0.5-1 tablets (4-8 mg total) by mouth every 8 (eight) hours as needed for nausea. 20 tablet 2  . rizatriptan (MAXALT-MLT) 10 MG disintegrating tablet Take 1 tablet (10 mg total) by mouth as needed for migraine. May repeat in 2 hours if needed 30 tablet 0   No current facility-administered medications on file prior to visit.    Allergies  Allergen Reactions  . Tramadol Nausea And Vomiting  . Trazodone And Nefazodone Other (See Comments)    Per pt trazodone caused hallucinations and behavior changes    Family History  Problem Relation Age of Onset  . Diabetes Mother   . Heart disease Mother   . Kidney disease Mother   . Thyroid disease Mother   . Heart disease Father   . Seizures  Father   . COPD Father   . Irritable bowel syndrome Father   . Cancer Maternal Grandmother   . Diabetes Sister   . Diabetes Brother   . Colon cancer Neg Hx   . Stomach cancer Neg Hx    Social History   Socioeconomic History  . Marital status: Married    Spouse name: Not on file  . Number of children: 3  . Years of education: 64  . Highest education level: Some college, no degree  Occupational History  . Not on file  Social Needs  . Financial resource strain: Not hard at all  . Food insecurity:    Worry: Never true    Inability: Never true  . Transportation needs:    Medical: No    Non-medical: No  Tobacco Use  . Smoking status: Never Smoker  . Smokeless tobacco: Never Used  Substance and Sexual Activity    . Alcohol use: No  . Drug use: No    Comment: 08-31-2016 PER PT NO   . Sexual activity: Yes    Partners: Male    Comment: married  Lifestyle  . Physical activity:    Days per week: 0 days    Minutes per session: 0 min  . Stress: Very much  Relationships  . Social connections:    Talks on phone: More than three times a week    Gets together: More than three times a week    Attends religious service: Never    Active member of club or organization: No    Attends meetings of clubs or organizations: Never    Relationship status: Married  Other Topics Concern  . Not on file  Social History Narrative  . Not on file   Depression screen [facility] 2/9 08/27/2017 08/13/2017 07/30/2017 07/16/2017 07/02/2017  Decreased Interest 1 1 0 1 3  Down, Depressed, Hopeless 1 1 0 0 3  PHQ - 2 Score 2 2 0 1 6  Altered sleeping 1 3 - - 3  Tired, decreased energy 1 3 - - 3  Change in appetite 1 2 - - 0  Feeling bad or failure about yourself  1 1 - - 3  Trouble concentrating 1 3 - - 3  Moving slowly or fidgety/restless 1 3 - - 3  Suicidal thoughts 1 0 - - 3  PHQ-9 Score 9 17 - - 24  Difficult doing work/chores - Somewhat difficult - - Extremely dIfficult  Some recent data might be hidden      Review of Systems  Constitutional: Positive for activity change, appetite change and fatigue. Negative for fever.  HENT: Negative for congestion, dental problem, ear pain, rhinorrhea, sinus pressure and tinnitus.   Eyes: Positive for photophobia and visual disturbance. Negative for pain and discharge.  Gastrointestinal: Positive for nausea and vomiting. Negative for constipation and diarrhea.  Musculoskeletal: Positive for arthralgias, back pain and gait problem.  Skin: Negative for rash.  Neurological: Positive for weakness, light-headedness and headaches. Negative for dizziness, tremors, seizures, syncope and facial asymmetry.  Psychiatric/Behavioral: Negative for sleep disturbance.       Objective:    Physical Exam  Constitutional: She is oriented to person, place, and time. She appears well-developed and well-nourished. She appears listless.  Laying on exam table in dark room, all lights off.  HENT:  Head: Normocephalic and atraumatic.  Right Ear: Tympanic membrane, external ear and ear canal normal.  Left Ear: Tympanic membrane, external ear and ear canal normal.  Nose: Nose  normal. Right sinus exhibits no maxillary sinus tenderness. Left sinus exhibits no maxillary sinus tenderness.  Mouth/Throat: Uvula is midline, oropharynx is clear and moist and mucous membranes are normal. No oropharyngeal exudate.  Eyes: Pupils are equal, round, and reactive to light. Conjunctivae and EOM are normal.  Neck: Normal range of motion. Neck supple. No thyromegaly present.  Cardiovascular: Normal rate, regular rhythm, normal heart sounds and intact distal pulses.  Pulmonary/Chest: Effort normal and breath sounds normal. No respiratory distress.  Neurological: She is oriented to person, place, and time. She appears listless. No cranial nerve deficit or sensory deficit. She displays a negative Romberg sign. Gait (but at her baseline due to chronic sciatica) abnormal. Coordination normal.  Skin: Skin is warm and dry. She is not diaphoretic.  Psychiatric: Her speech is normal. Judgment normal. She is slowed. She exhibits a depressed mood. She exhibits normal recent memory and normal remote memory.      BP 132/85 (BP Location: Right Arm, Patient Position: Sitting, Cuff Size: Large)   Pulse 99   Temp 98.4 F (36.9 C) (Oral)   Resp 18   Ht 5\' 2"  (1.575 m)   Wt 205 lb 6.4 oz (93.2 kg)   SpO2 97%   BMI 37.57 kg/m   Results for orders placed or performed in visit on 07/30/17  POCT urinalysis dipstick  Result Value Ref Range   Color, UA yellow yellow   Clarity, UA clear clear   Glucose, UA negative negative mg/dL   Bilirubin, UA negative negative   Ketones, POC UA negative negative mg/dL   Spec Grav,  UA >=4.098 (A) 1.010 - 1.025   Blood, UA negative negative   pH, UA 5.5 5.0 - 8.0   Protein Ur, POC negative negative mg/dL   Urobilinogen, UA 1.0 0.2 or 1.0 E.U./dL   Nitrite, UA Negative Negative   Leukocytes, UA Negative Negative  POCT Microscopic Urinalysis ([facility])  Result Value Ref Range   WBC,UR,HPF,POC Few (A) None WBC/hpf   RBC,UR,HPF,POC None None RBC/hpf   Bacteria Many (A) None, Too numerous to count   Mucus Absent Absent   Epithelial Cells, UR Per Microscopy Moderate (A) None, Too numerous to count cells/hpf       Assessment & Plan:   1. Intractable migraine with status migrainosus, unspecified migraine type - Try to break migraine w/ IM toradol 30 and dephenhydramine 50 IM x 1 now. Restart topamax which was apparently working incredibly well for [employer] prevention for pt - stopped along with many of her other meds during recent psych hosp in [place] 2/18-22 but they did not mention this med at all in the papers I received or why it was stopped or contraind in particular so will restart.   2. Recurrent UTI - ua nml today  3. Non-intractable vomiting with nausea, unspecified vomiting type - presumed due to constant migraine? Try promethazine to treat both  4. Severe recurrent major depressive disorder with psychotic features (HCC)   5. Encounter for long-term (current) use of medications   6. Hallucination, visual - resolved    Orders Placed This Encounter  Procedures  . POCT urinalysis dipstick  . POCT Microscopic Urinalysis ([facility])    Meds ordered this encounter  Medications  . promethazine (PHENERGAN) injection 25 mg  . topiramate (TOPAMAX) 25 MG tablet    Sig: 1 tab po qhs x 1 wk, then 1 tab po bid x 1 wk, the 1 tab po qam and 2 tabs qhs x 1 wk, then 2  tabs po bid    Dispense:  78 tablet    Refill:  0  . topiramate (TOPAMAX) 50 MG tablet    Sig: Take 1 tablet (50 mg total) by mouth 2 (two) times daily.    Dispense:  180 tablet    Refill:  0  . ketorolac  (TORADOL) 30 MG/ML injection 30 mg  . diphenhydrAMINE (BENADRYL) injection 50 mg    I personally performed the services described in this documentation, which was scribed in my presence. The recorded information has been reviewed and considered, and addended by me as needed.   Norberto Sorenson, M.D.  [facility] [address] [place], [place] [address] [phone] phone [phone] fax  09/14/17 9:11 AM

## 2017-08-04 DIAGNOSIS — H25011 Cortical age-related cataract, right eye: Secondary | ICD-10-CM | POA: Diagnosis not present

## 2017-08-04 DIAGNOSIS — H2511 Age-related nuclear cataract, right eye: Secondary | ICD-10-CM | POA: Diagnosis not present

## 2017-08-04 DIAGNOSIS — H2513 Age-related nuclear cataract, bilateral: Secondary | ICD-10-CM | POA: Diagnosis not present

## 2017-08-04 DIAGNOSIS — H25013 Cortical age-related cataract, bilateral: Secondary | ICD-10-CM | POA: Diagnosis not present

## 2017-08-12 ENCOUNTER — Other Ambulatory Visit: Payer: Self-pay | Admitting: Family Medicine

## 2017-08-13 ENCOUNTER — Encounter: Payer: Self-pay | Admitting: Family Medicine

## 2017-08-13 ENCOUNTER — Ambulatory Visit: Payer: Medicare HMO | Admitting: Family Medicine

## 2017-08-13 ENCOUNTER — Telehealth: Payer: Self-pay | Admitting: Family Medicine

## 2017-08-13 ENCOUNTER — Other Ambulatory Visit: Payer: Self-pay

## 2017-08-13 VITALS — BP 106/78 | HR 96 | Temp 98.0°F | Resp 16 | Ht 62.5 in | Wt 200.6 lb

## 2017-08-13 DIAGNOSIS — R6889 Other general symptoms and signs: Secondary | ICD-10-CM | POA: Diagnosis not present

## 2017-08-13 DIAGNOSIS — F515 Nightmare disorder: Secondary | ICD-10-CM

## 2017-08-13 DIAGNOSIS — F5105 Insomnia due to other mental disorder: Secondary | ICD-10-CM | POA: Diagnosis not present

## 2017-08-13 DIAGNOSIS — N390 Urinary tract infection, site not specified: Secondary | ICD-10-CM | POA: Diagnosis not present

## 2017-08-13 DIAGNOSIS — G43911 Migraine, unspecified, intractable, with status migrainosus: Secondary | ICD-10-CM

## 2017-08-13 DIAGNOSIS — K219 Gastro-esophageal reflux disease without esophagitis: Secondary | ICD-10-CM

## 2017-08-13 DIAGNOSIS — R519 Headache, unspecified: Secondary | ICD-10-CM

## 2017-08-13 DIAGNOSIS — F4310 Post-traumatic stress disorder, unspecified: Secondary | ICD-10-CM | POA: Diagnosis not present

## 2017-08-13 DIAGNOSIS — F99 Mental disorder, not otherwise specified: Secondary | ICD-10-CM

## 2017-08-13 DIAGNOSIS — G43711 Chronic migraine without aura, intractable, with status migrainosus: Secondary | ICD-10-CM

## 2017-08-13 DIAGNOSIS — R441 Visual hallucinations: Secondary | ICD-10-CM | POA: Diagnosis not present

## 2017-08-13 DIAGNOSIS — F333 Major depressive disorder, recurrent, severe with psychotic symptoms: Secondary | ICD-10-CM

## 2017-08-13 DIAGNOSIS — R51 Headache: Secondary | ICD-10-CM

## 2017-08-13 LAB — POCT URINALYSIS DIP (MANUAL ENTRY)
Bilirubin, UA: NEGATIVE
GLUCOSE UA: NEGATIVE mg/dL
Ketones, POC UA: NEGATIVE mg/dL
Leukocytes, UA: NEGATIVE
NITRITE UA: NEGATIVE
PH UA: 6.5 (ref 5.0–8.0)
PROTEIN UA: NEGATIVE mg/dL
RBC UA: NEGATIVE
SPEC GRAV UA: 1.01 (ref 1.010–1.025)
UROBILINOGEN UA: 0.2 U/dL

## 2017-08-13 MED ORDER — GALCANEZUMAB-GNLM 120 MG/ML ~~LOC~~ SOAJ: SUBCUTANEOUS | 4 refills | Status: DC

## 2017-08-13 MED ORDER — HYDROXYZINE PAMOATE 100 MG PO CAPS: 200.0000 mg | ORAL_CAPSULE | Freq: Every day | ORAL | 0 refills | Status: DC

## 2017-08-13 MED ORDER — FAMOTIDINE 40 MG PO TABS: 40.0000 mg | ORAL_TABLET | Freq: Two times a day (BID) | ORAL | 3 refills | Status: DC

## 2017-08-13 MED ORDER — HYDROXYZINE PAMOATE 50 MG PO CAPS: 50.0000 mg | ORAL_CAPSULE | Freq: Four times a day (QID) | ORAL | 1 refills | Status: DC | PRN

## 2017-08-13 MED ORDER — PRAZOSIN HCL 1 MG PO CAPS: 1.0000 mg | ORAL_CAPSULE | Freq: Every day | ORAL | 0 refills | Status: DC

## 2017-08-13 MED ORDER — QUETIAPINE FUMARATE 100 MG PO TABS: 200.0000 mg | ORAL_TABLET | Freq: Every day | ORAL | 0 refills | Status: DC

## 2017-08-13 MED ORDER — PRAZOSIN HCL 2 MG PO CAPS: 4.0000 mg | ORAL_CAPSULE | Freq: Every day | ORAL | 0 refills | Status: DC

## 2017-08-13 MED ORDER — TOPIRAMATE 25 MG PO TABS: 25.0000 mg | ORAL_TABLET | ORAL | 0 refills | Status: DC

## 2017-08-13 NOTE — Patient Instructions (Addendum)
Take 200mg  of the quietiapine and 200mg  of the hydroxyzine at night before bed. Stop the topiramate 1 a day in a week. Recheck in 2 weeks - hopefully we can start the once monthy emgality injection for migraines by then. We can use the famotidine in place of the cemitidine since it is on back order.    IF you received an x-ray today, you will receive an invoice from [facility]. Please contact [facility] at [phone] with questions or concerns regarding your invoice.   IF you received labwork today, you will receive an invoice from [facility]. Please contact [facility] at [phone] with questions or concerns regarding your invoice.   Our billing staff will not be able to assist you with questions regarding bills from these companies.  You will be contacted with the lab results as soon as they are available. The fastest way to get your results is to activate your My Chart account. Instructions are located on the last page of this paperwork. If you have not heard from us regarding the results in 2 weeks, please contact this office.

## 2017-08-13 NOTE — Progress Notes (Signed)
Subjective:  By signing my name below, I, Stann Ore, attest that this documentation has been prepared under the direction and in the presence of Norberto Sorenson, MD. Electronically Signed: Stann Ore, Scribe. 08/13/2017 , 9:29 AM .  Patient was seen in Room 3 .   Patient ID: Jennifer Davidson, female    DOB: 09/17/1955, 62 y.o.   MRN: [id number] Chief Complaint  Patient presents with  . Sleeping Problem    16 hours of sleep total since last Sunday. Migraine from two weeks ago has resolved.   HPI Jennifer Davidson is a 62 y.o. female who presents to Primary Care at [facility] complaining of sleeping issue since last Sunday (6 days ago). Was doing better until about 10d ago, she started having more dreams so was concerned that some increase stress and worry she was having might have precipitated the dreams. She was not seeing people, family members, or talking to them anymore but she is seeing little things on the wall that aren't there - or think she will see mice that aren't there.  However, everything else was going really well - walking, talking, eating, other than the sleep, until about 6d ago when she stopped sleeping - couldn't sleep at all for the initial 4d and then for the past 2d she has jsut been dropping off for short little snippets but starts dreaming immediately which wakes her p.  Her Seroquel was increased to 150mg .  Patient reports feeling anxious when trying to go to sleep, as she's trying to fight her brain because she doesn't want to have dreams. She states her dreams tend to be vivid and scary, with occasional screams over night though she denies nightmares. She isn't able to sleep even with her husband there currently when in the past having him home has allowed her to be comfortable enough to fall asleep. Her son was with her yesterday, and he noticed patient started talking in her sleep nearly right after closing her eyes. Husband has noticed this too for the past few days  Her migraines  have resolved for >10d.  She's taking topamax 25mg  1.5 tablets a day for her migraine which we just restarted 2 wks ago when pt had presented with weeks of intractable migraine HA which started within 1-2 weeks of stopping the topamax during her 2/18-22 psych hosp the month prior. When she was on the topamax prior to 2/18, headaches had been well controlled and migraines relatively rare. Therefore, at her last visit w/ me 2 wks ago for severe migraine, when she had been of the topamax for almost 4 wks w/ chronic daily debilitating HAs for the last 2 wks of that time, we restarted pt on topamax 25 qd w/ plans to taper up by 25mg  qwk to 50 bid. Had increased to topamax 25 bid but when HAs resolved and insomnia/dreams recurred, [name] very smartly backed off to just 1/2 tab qam and 1 tab qhs.    She denies taking magnesium supplement. She is walking better and speaking more normally.  She also informs having decreased appetite recently - for the first week after our last visit 2 wks ago her appetite was good. She's also been taking pepcid BID with nexium for improvement of her reflux.  She denies diarrhea.  She's scheduled to have cataracts surgery on 4/3 and 4/17.  Saw the eye doctor 3/21 - 9 d prior - who noted that her vision is very poor due to the cataraces   Past  Medical History:  Diagnosis Date  . Anxiety   . Cholesterol serum increased   . Chronic back pain    chronic Rt low back pain. s/p L4-5 fusion. failed Rt facet injections. poss due to Rt SI joint dysfunction.  . Chronic kidney disease   . Depression   . Diastolic heart failure (HCC)   . GERD (gastroesophageal reflux disease)   . Hyperparathyroidism (HCC)   . Hypertension   . Hypothyroidism   . Migraine   . Neuromuscular disorder (HCC)   . Sleep apnea   . Tachycardia   . Trochanteric bursitis of both hips 2012   Confirmed on MRI   Past Surgical History:  Procedure Laterality Date  . ABDOMINAL HYSTERECTOMY    . APPENDECTOMY      . BACK SURGERY     L4-5 fusion  . KNEE SURGERY     Prior to Admission medications   Medication Sig Start Date End Date Taking? Authorizing Provider  Butalbital-APAP-Caffeine 50-325-40 MG capsule Take 2 capsules by mouth every 4 (four) hours as needed for headache. 07/16/17  Yes [name], MD  cholestyramine Lanetta Inch) 4 GM/DOSE powder Take 1 packet (4 g total) by mouth 3 (three) times daily with meals. As needed for diarrhea Patient taking differently: Take 4 g by mouth 3 (three) times daily as needed (for diarrhea.). As needed for diarrhea 04/25/17  Yes [name], MD  cimetidine (TAGAMET) 400 MG tablet Take 2 tablets (800 mg total) by mouth 2 (two) times daily. 07/16/17  Yes [name], MD  diclofenac sodium (VOLTAREN) 1 % GEL Apply 2 g topically daily as needed (for knee pain).   Yes [provider]  DULoxetine (CYMBALTA) 60 MG capsule Take 1 capsule (60 mg total) by mouth daily. 07/22/17  Yes Hisada, Barbee Cough, MD  esomeprazole (NEXIUM) 40 MG capsule TAKE 1 CAPSULE (40 MG TOTAL) BY MOUTH DAILY. 12/09/16  Yes [name], MD  gabapentin (NEURONTIN) 600 MG tablet Take 1 tablet (600 mg total) by mouth 3 (three) times daily. 07/16/17  Yes [name], MD  hydrOXYzine (VISTARIL) 50 MG capsule Take 1 capsule (50 mg total) by mouth every 6 (six) hours as needed for anxiety. 07/18/17  Yes [name], MD  levothyroxine (SYNTHROID, LEVOTHROID) 50 MCG tablet Take 1 tablet (50 mcg total) by mouth daily. Patient taking differently: Take 50 mcg by mouth daily before breakfast.  04/25/17  Yes [name], MD  ondansetron (ZOFRAN-ODT) 8 MG disintegrating tablet Take 0.5-1 tablets (4-8 mg total) by mouth every 8 (eight) hours as needed for nausea. 07/12/17  Yes [name], MD  QUEtiapine (SEROQUEL) 100 MG tablet Take 1 tablet (100 mg total) by mouth at bedtime. 07/22/17  Yes Neysa Hotter, MD  rizatriptan (MAXALT-MLT) 10 MG disintegrating tablet Take 1 tablet (10 mg total) by mouth as needed for migraine. May  repeat in 2 hours if needed 07/16/17  Yes [name], MD  topiramate (TOPAMAX) 25 MG tablet 1 tab po qhs x 1 wk, then 1 tab po bid x 1 wk, the 1 tab po qam and 2 tabs qhs x 1 wk, then 2 tabs po bid 07/30/17  Yes [name], MD  topiramate (TOPAMAX) 50 MG tablet Take 1 tablet (50 mg total) by mouth 2 (two) times daily. Patient not taking: Reported on 08/13/2017 07/30/17   [name], MD   Allergies  Allergen Reactions  . Tramadol Nausea And Vomiting  . Trazodone And  Nefazodone Other (See Comments)    Per pt trazodone caused hallucinations and behavior changes    Family History  Problem Relation Age of Onset  . Diabetes Mother   . Heart disease Mother   . Kidney disease Mother   . Thyroid disease Mother   . Heart disease Father   . Seizures Father   . COPD Father   . Cancer Maternal Grandmother    Social History   Socioeconomic History  . Marital status: Married    Spouse name: Not on file  . Number of children: 3  . Years of education: 63  . Highest education level: Some college, no degree  Occupational History  . Not on file  Social Needs  . Financial resource strain: Not hard at all  . Food insecurity:    Worry: Never true    Inability: Never true  . Transportation needs:    Medical: No    Non-medical: No  Tobacco Use  . Smoking status: Never Smoker  . Smokeless tobacco: Never Used  Substance and Sexual Activity  . Alcohol use: No  . Drug use: No    Comment: 08-31-2016 PER PT NO   . Sexual activity: Yes    Partners: Male    Comment: married  Lifestyle  . Physical activity:    Days per week: 0 days    Minutes per session: 0 min  . Stress: Very much  Relationships  . Social connections:    Talks on phone: More than three times a week    Gets together: More than three times a week    Attends religious service: Never    Active member of club or organization: No    Attends meetings of clubs or organizations: Never    Relationship status: Married  Other Topics  Concern  . Not on file  Social History Narrative  . Not on file   Depression screen [facility] 2/9 07/30/2017 07/16/2017 07/02/2017 04/18/2017 04/18/2017  Decreased Interest 0 1 3 0 3  Down, Depressed, Hopeless 0 0 3 0 3  PHQ - 2 Score 0 1 6 0 6  Altered sleeping - - 3 - 3  Tired, decreased energy - - 3 - 3  Change in appetite - - 0 - 1  Feeling bad or failure about yourself  - - 3 - 1  Trouble concentrating - - 3 - 3  Moving slowly or fidgety/restless - - 3 - 0  Suicidal thoughts - - 3 - 0  PHQ-9 Score - - 24 - 17  Difficult doing work/chores - - Extremely dIfficult - Very difficult  Some recent data might be hidden    Review of Systems  Constitutional: Positive for appetite change. Negative for fatigue and unexpected weight change.  Respiratory: Negative for chest tightness and shortness of breath.   Cardiovascular: Negative for chest pain, palpitations and leg swelling.  Gastrointestinal: Negative for abdominal pain, blood in stool and diarrhea.  Neurological: Negative for dizziness, syncope, light-headedness and headaches.  Psychiatric/Behavioral: Positive for sleep disturbance.       Objective:   Physical Exam  Constitutional: She is oriented to person, place, and time. She appears well-developed and well-nourished. No distress.  HENT:  Head: Normocephalic and atraumatic.  Eyes: Pupils are equal, round, and reactive to light. EOM are normal.  Neck: Neck supple.  Cardiovascular: Normal rate.  Pulmonary/Chest: Effort normal. No respiratory distress.  Musculoskeletal: Normal range of motion.  Neurological: She is alert and oriented to person, place, and  time.  Skin: Skin is warm and dry.  Psychiatric: She has a normal mood and affect. Her behavior is normal.  Nursing note and vitals reviewed.   BP 106/78   Pulse 96   Temp 98 F (36.7 C)   Resp 16   Ht 5' 2.5" (1.588 m) Comment: pt stated  Wt 200 lb 9.6 oz (91 kg)   SpO2 96%   BMI 36.11 kg/m      Results for orders  placed or performed in visit on 08/13/17  POCT urinalysis dipstick  Result Value Ref Range   Color, UA yellow yellow   Clarity, UA clear clear   Glucose, UA negative negative mg/dL   Bilirubin, UA negative negative   Ketones, POC UA negative negative mg/dL   Spec Grav, UA 6.0451.010 4.0981.010 - 1.025   Blood, UA negative negative   pH, UA 6.5 5.0 - 8.0   Protein Ur, POC negative negative mg/dL   Urobilinogen, UA 0.2 0.2 or 1.0 E.U./dL   Nitrite, UA Negative Negative   Leukocytes, UA Negative Negative    Assessment & Plan:   1. Intractable migraine with status migrainosus, unspecified migraine type - resolved since restarting topamax but interesting that the dreams have recurred. She has been having problems with these dreams causing a reluctance to sleep which causes a psychosis with visional and auditory hallucinations when severe for >2 yrs but when topamax was stopped x 1 mo she was sleeping great but severe chronic daily debilitating HAs. Resumed topamax and HAs quickly resolved completely but dreams recurred and now preventing self from falling asleep -> triggering insomnia and starting to notice the first few visual hallucinations again after awake x 3-4d.  The resolution and recurrence of the dreams/hallucinations with stopping/starting topamax is pretty convincing - I highly suspect topamax has been causing the worsening dreams which triggers the insomnia - decrease topamax to 25mg  qhs (stop the 1/2 tab qam) x 1 wk then stop the topamax entirely.   Hypotension relative contraindication to using all BP meds as preventatives and I think other anti-epileptics are likely to cause similar if not worse problems to pt's mental health and be less effective.  Is maxed out on gabapentin 600 tid - increases cause more fatigue - and cymbalta 60 qd - increases cause paradoxical worsening depression - for years. Can't use other ssri/antidepressants for preventative migraine med due to fragile mental health and  polypharmacy.  Failed topamax, gabapentin, duloxetine, amitriptyline, fioricet, diclofenac, diazepam, diphenhydramine, long-acting narcotics, maxalt, imitrex, ketorolac, methocarbamol, metoprolol, propranolol, promethazine, tizanidine, and trazodone. Trials of other migraine preventative anti-hypertensives contra-indicated due to HTN and CKDIII.  She is a GREAT candidate for one of the biologics - Emgality, Ajovy, Aimovig - none on her insurance formulary so will try for Emgality and see if we can get a rushed PA to go through before she is off of the topamax for to long before her contant intractable severe migraines recur. Failed prn fioricet to treat HAs - didn't really help at all - even 2 tabs. Cont prn maxalt which helps a little.  2. Recurrent UTI - ua nml today  3. Gastroesophageal reflux disease, esophagitis presence not specified - cimetidine was working very ewll for sxs 800mg  bid but apparently it is on back order so will switch to famotidine. Cont nxium  4. Hallucination, visual   5. Severe recurrent major depressive disorder with psychotic features (HCC)   6. PTSD (post-traumatic stress disorder)   7. Insomnia due to other mental  disorder - increase seroquel form 150 qhs ot 200 qhs. OK to cont hydroxyzine 200mg  qhs and 50 qid prn anxiety since working well w/o currently side effects  8. Dream anxiety disorder - expect to resolve with stopping the topamax in 1-2 wks - if does not, call immed as will likely want to resume topamax and try something else. The anxiety about visit PTSD dreams causes self-induced (but involuntarily so) insomnia ulimtately resulting in visual hallucinations followed by delerium as it progresses - has happened numerous times over past several years - rec trial of prazosin to decrease dreams - advised that will need to increase by 1mg  every 3-4d and watch for orthostatic sxs - stop tapering up if becomes symptomatic of hypotension/dizzy but will aim to get to minimum  of 6 mg as most studies have found 6-15mg  to be effective. Pt has appt with new therapist Josh in 2 wks and psychiatrist Dr. Vanetta Shawl in 3.4 wks (4/24)  9. Vivid dream     Orders Placed This Encounter  Procedures  . POCT urinalysis dipstick    Meds ordered this encounter  Medications  . Galcanezumab-gnlm (EMGALITY) 120 MG/ML SOAJ    Sig: Inject 240 mg into the skin every 30 (thirty) days for 30 days, THEN 120 mg every 30 (thirty) days.    Dispense:  12 mL    Refill:  4  . famotidine (PEPCID) 40 MG tablet    Sig: Take 1 tablet (40 mg total) by mouth 2 (two) times daily.    Dispense:  180 tablet    Refill:  3    I personally performed the services described in this documentation, which was scribed in my presence. The recorded information has been reviewed and considered, and addended by me as needed.   Norberto Sorenson, M.D.  Primary Care at [facility] [address] [place], [place] [address] [phone] phone [phone] fax  09/14/17 9:44 AM

## 2017-08-13 NOTE — Telephone Encounter (Addendum)
[facility] - failed topamax, gabapentin, duloxetine, amitriptyline, fioricet, diclofenac, diazepam, diphenhydramine, long-acting narcotics, maxalt, imitrex, ketorolac, methocarbamol, metoprolol, propranolol, promethazine, tizanidine, and trazodone. Trials of other migraine preventative anti-hypertensives contra-indicated due to HTN and CKDIII.  PA is urgent - pt has constant intractable severe migraines that were controlled on topamax but turns out topamax is causing hallucinations so need to get her off asap though migraines resume whenever we try to stop.  I would be fine to change her to Aimovig or Ajovy if her insurance prefers those but in Epic they said they all needed PA.  Ignore rx'd meds on this encounter - accidentally ordered meds from today's visit in this telephone encounter rather than her visit encounter.

## 2017-08-15 NOTE — Telephone Encounter (Signed)
PA put in as emergent approval in 24 hours.  #[id number]  [id number] human[employer] number

## 2017-08-17 DIAGNOSIS — H2512 Age-related nuclear cataract, left eye: Secondary | ICD-10-CM | POA: Diagnosis not present

## 2017-08-17 DIAGNOSIS — H2511 Age-related nuclear cataract, right eye: Secondary | ICD-10-CM | POA: Diagnosis not present

## 2017-08-17 DIAGNOSIS — H25012 Cortical age-related cataract, left eye: Secondary | ICD-10-CM | POA: Diagnosis not present

## 2017-08-17 DIAGNOSIS — H25011 Cortical age-related cataract, right eye: Secondary | ICD-10-CM | POA: Diagnosis not present

## 2017-08-17 NOTE — Telephone Encounter (Signed)
Dr. Clelia CroftShaw they are denying the Emgality.   They were given all the medications she failed and still denying.   The letter is in the box

## 2017-08-21 ENCOUNTER — Other Ambulatory Visit: Payer: Self-pay | Admitting: Family Medicine

## 2017-08-22 NOTE — Telephone Encounter (Signed)
Last OV: 07/30/17 PCP: Jennifer Davidson Pharmacy: [id number] - [place] [place] - [address] [facility] [phone] (Phone) [phone] (Fax)

## 2017-08-24 ENCOUNTER — Telehealth: Payer: Self-pay

## 2017-08-24 ENCOUNTER — Encounter: Payer: Self-pay | Admitting: Physician Assistant

## 2017-08-24 MED ORDER — PROPRANOLOL HCL 20 MG PO TABS: 20.0000 mg | ORAL_TABLET | Freq: Every day | ORAL | 0 refills | Status: DC

## 2017-08-24 NOTE — Telephone Encounter (Signed)
[place] -pt's husband - Jennifer DikeSusie has an appt to see me in 3d - since we had her wean down on the topamax, her migraines have resolved but she has developed a severe daily headache (which she characterizes as different from her migraines.) I really do not know what to do as it seems like topamax is the only option for HA control/prevention - and it did that BEAUTIFULLY - but it also seems pretty conclusive (as she has now been put on it and taken back off twice-  that it has been at least exacerbating the night mares which are so immediate and realistic she does anything she can to avoid falling asleep.  Each time she comes off, she starts sleeping better and her nightmares arent an issue.   Told Jennifer Davidson: I would like pt to restart the propranolol 20mg  qhs that Dr. Karel JarvisAquino initially rx'd last yr.  Also placed referral back to her - not sure if she will see her since Dr. Rosalyn GessAquino's main focus is szs and one her colleagues usu sees the migraine pts but it would be good to have provider continuity for this since the issues are tied together. Pt saw Dr. Karel JarvisAquino 09/2016 for seizure-like activity - Dr. Karel JarvisAquino did include a summary of pt's HA hx in that initial eval and rx'd propranolol 20 qhs for additional migraine prophylaxis. I was hoping since she is an established pt, she would be willing to see pt to help address her chronic debilitated HAs that she is having after I had to take her off of the prophylaxis topamax as it was causing night terrors to the severity that it was causing pt to stay up for many days and start hallucinating (and then likely triggered the pseudo-seizuers she was seen for last yr

## 2017-08-24 NOTE — Addendum Note (Signed)
Addended by: Sherren MochaSHAW, Katherine Tout N on: 08/24/2017 06:21 PM   Modules accepted: Orders

## 2017-08-24 NOTE — Telephone Encounter (Signed)
PA redetermination for denied drug Emgality- form to Dr. Clelia CroftShaw

## 2017-08-24 NOTE — Telephone Encounter (Signed)
DUPLICATE

## 2017-08-27 ENCOUNTER — Other Ambulatory Visit: Payer: Self-pay

## 2017-08-27 ENCOUNTER — Ambulatory Visit (INDEPENDENT_AMBULATORY_CARE_PROVIDER_SITE_OTHER): Payer: Medicare HMO | Admitting: Family Medicine

## 2017-08-27 ENCOUNTER — Encounter: Payer: Self-pay | Admitting: Family Medicine

## 2017-08-27 VITALS — BP 116/72 | HR 82 | Temp 98.0°F | Resp 16 | Ht 62.8 in | Wt 200.0 lb

## 2017-08-27 DIAGNOSIS — F99 Mental disorder, not otherwise specified: Secondary | ICD-10-CM

## 2017-08-27 DIAGNOSIS — G43911 Migraine, unspecified, intractable, with status migrainosus: Secondary | ICD-10-CM | POA: Diagnosis not present

## 2017-08-27 DIAGNOSIS — F515 Nightmare disorder: Secondary | ICD-10-CM | POA: Diagnosis not present

## 2017-08-27 DIAGNOSIS — F4310 Post-traumatic stress disorder, unspecified: Secondary | ICD-10-CM | POA: Diagnosis not present

## 2017-08-27 DIAGNOSIS — F333 Major depressive disorder, recurrent, severe with psychotic symptoms: Secondary | ICD-10-CM

## 2017-08-27 DIAGNOSIS — R112 Nausea with vomiting, unspecified: Secondary | ICD-10-CM

## 2017-08-27 DIAGNOSIS — R441 Visual hallucinations: Secondary | ICD-10-CM

## 2017-08-27 DIAGNOSIS — F5105 Insomnia due to other mental disorder: Secondary | ICD-10-CM | POA: Diagnosis not present

## 2017-08-27 DIAGNOSIS — R6889 Other general symptoms and signs: Secondary | ICD-10-CM | POA: Diagnosis not present

## 2017-08-27 DIAGNOSIS — Z79899 Other long term (current) drug therapy: Secondary | ICD-10-CM | POA: Diagnosis not present

## 2017-08-27 DIAGNOSIS — K219 Gastro-esophageal reflux disease without esophagitis: Secondary | ICD-10-CM | POA: Diagnosis not present

## 2017-08-27 MED ORDER — QUETIAPINE FUMARATE 200 MG PO TABS: 200.0000 mg | ORAL_TABLET | Freq: Every day | ORAL | 0 refills | Status: DC

## 2017-08-27 MED ORDER — PROPRANOLOL HCL 20 MG PO TABS: 20.0000 mg | ORAL_TABLET | Freq: Every day | ORAL | 1 refills | Status: DC

## 2017-08-27 MED ORDER — QUETIAPINE FUMARATE 200 MG PO TABS: 200.0000 mg | ORAL_TABLET | Freq: Every day | ORAL | 1 refills | Status: DC

## 2017-08-27 NOTE — Progress Notes (Signed)
Subjective:  By signing my name below, I, Stann Oresung-Kai Tsai, attest that this documentation has been prepared under the direction and in the presence of Norberto SorensonEva Bucky Grigg, MD. Electronically Signed: Stann Oresung-Kai Tsai, Scribe. 08/27/2017 , 2:37 PM .  Patient was seen in Room 1 .   Patient ID: Jennifer SantaSusie G Davidson, female    DOB: 04/07/1956, 62 y.o.   MRN: [id number] Chief Complaint  Patient presents with  . Chronic Conditions    follow-up    HPI Jennifer Davidson is a 62 y.o. female who presents to [facility] at [facility] for follow up. Patient's migraines were well controlled on Topamax long before I knew her. First records available in Epic in 2011 showed that she was maintained on Topamax, with occasional breakthrough but no need for other prophylactic trials of medications until now. However, we highly suspect that the topamax had been triggering vivid dreams, nightmares, PTSD flashbacks resulting in self-induced insomnia resulting in visual hallucinations as this all resolved when she was taking off of topamax 2/18-2/22 during [place] psych hosp in [place] but then the chronic daily migraine HAs returned and were severe and debilitated within 1-2 weeks off of the topamax.  We restarted topamax about 1 mo after it was stopped and her nightmares with PTSD flashback returned as did the insomnia for 6d with the beginning of the visual hallucinations within <1 wk of resuming topamax 25mg , though the migraine HAs stopped. Therefore, 2 weeks ago, we started to wean her off of the topamax and she stopped it >1 wk ago.  Her nightmares are gone but now the HAs have returned for >4-5d though initially felt different from her typical migraine. Patient's husband Loraine LericheMark contacted me 3d ago about the recurrence of HA off of topamax (though sleeping better with nightmare resolution) so we restarted pt on propranolol 20mg  QHS which was initially recommended last yr by Dr. Karel JarvisAquino (neurology whom pt saw for seizure-like activity that ultimately was  attributed to conversion d/o). I also placed referral back to see Dr. Karel JarvisAquino for further aid in HA management as all other preventative meds are contraindicated or already failed.  Patient has an appointment with Dr. Karel JarvisAquino on Tuesday, 08/30/17. Tried for PA on new biologic - Emgality - but her [facility] denied it.   Patient states her headaches with some blurry vision have been bad in the past few weeks, with one lasting for 5 straight days and another lasting for 4 straight days. She describes headaches being throbbing when her heart beats. She denies pain over her temples. She's been taking imitrex, and headaches resolved after a few doses. She mentions usually seeing spots with her migraines, but not with the headaches recently. She denies having a headache today. She she started on the propranolol 20 qhs 2d ago, HAs seem to be getting better.  Failed fioricet.  She had right cataract removal 10 days prior - 4/3 with lens replaced. She will have left eye cataract removal next week - 4/17. She states her right eye is doing well and has noticed a difference; vision gradually improving.   Her husband, Loraine LericheMark, mentions patient has become more agitated with irritability and worrying about a lot of things. She also informs having her nails done at a salon yesterday, but her arms were being jittery and jerky. She feels a little mentally agitated too. Her husband suggested [place] [facility] [place], Wednesday and Fridays, and grocery shopping on Tuesdays and Thursdays as  he thinks it would be good for her to get out of the house and have a routine and purpose but she has not been able to do this yet - in large part because of her recent (and upcoming) cataract surgery she cannot currently drive.  Patient notes she can go to sleep as nightmares have stopped with medication prazosin - currently taking 3mg  qhs; however, if her sleep is interrupted, she will stay awake and has difficulty  returning to sleep. She's currently taking 3 of the 1mg  tablets of prazosin, and will start on 2 tablets of the 2mg (=4mg  total) prazosin tonight. She reports some dizziness in the morning, but notes she's had this prior to medication. No other signs of worsneing hypotension from combo of propranolol 20 and prazosin both qhs.    Has an appointment with a new therapist Sharia Reeve, on Monday 08/29/17 at [facility] in [place]. Has appt w/ her psychiatrist Dr. Vanetta Shawl on 4/24.  Past Medical History:  Diagnosis Date  . Anxiety   . Cholesterol serum increased   . Chronic back pain    chronic Rt low back pain. s/p L4-5 fusion. failed Rt facet injections. poss due to Rt SI joint dysfunction.  . Chronic kidney disease   . Depression   . Diastolic heart failure (HCC)   . GERD (gastroesophageal reflux disease)   . Hyperparathyroidism (HCC)   . Hypertension   . Hypothyroidism   . Migraine   . Neuromuscular disorder (HCC)   . Sleep apnea   . Tachycardia   . Trochanteric bursitis of both hips 2012   Confirmed on MRI   Past Surgical History:  Procedure Laterality Date  . ABDOMINAL HYSTERECTOMY    . APPENDECTOMY    . BACK SURGERY     L4-5 fusion  . KNEE SURGERY     Prior to Admission medications   Medication Sig Start Date End Date Taking? Authorizing Provider  cholestyramine (QUESTRAN) 4 GM/DOSE powder Take 1 packet (4 g total) by mouth 3 (three) times daily with meals. As needed for diarrhea Patient taking differently: Take 4 g by mouth 3 (three) times daily as needed (for diarrhea.). As needed for diarrhea 04/25/17   [name], MD  diclofenac sodium (VOLTAREN) 1 % GEL Apply 2 g topically daily as needed (for knee pain).    [provider]  DULoxetine (CYMBALTA) 60 MG capsule Take 1 capsule (60 mg total) by mouth daily. 07/22/17   Neysa Hotter, MD  esomeprazole (NEXIUM) 40 MG capsule TAKE 1 CAPSULE (40 MG TOTAL) BY MOUTH DAILY. 12/09/16   [name], MD  famotidine (PEPCID)  40 MG tablet Take 1 tablet (40 mg total) by mouth 2 (two) times daily. 08/13/17   [name], MD  gabapentin (NEURONTIN) 600 MG tablet Take 1 tablet (600 mg total) by mouth 3 (three) times daily. 07/16/17   [name], MD  Galcanezumab-gnlm [facility]) 120 MG/ML SOAJ Inject 240 mg into the skin every 30 (thirty) days for 30 days, THEN 120 mg every 30 (thirty) days. 08/13/17 09/12/18  [name], MD  hydrOXYzine (VISTARIL) 100 MG capsule Take 2 capsules (200 mg total) by mouth at bedtime. sleep 08/13/17   [name], MD  hydrOXYzine (VISTARIL) 50 MG capsule Take 1 capsule (50 mg total) by mouth every 6 (six) hours as needed for anxiety. 08/13/17   [name], MD  levothyroxine (SYNTHROID, LEVOTHROID) 50 MCG tablet Take 1 tablet (50 mcg total) by mouth daily.  Patient taking differently: Take 50 mcg by mouth daily before breakfast.  04/25/17   [name], MD  ondansetron (ZOFRAN-ODT) 8 MG disintegrating tablet Take 0.5-1 tablets (4-8 mg total) by mouth every 8 (eight) hours as needed for nausea. 07/12/17   [name], MD  prazosin (MINIPRESS) 1 MG capsule Take 1 capsule (1 mg total) by mouth at bedtime. X3d, the 2 tabs po qhs x4d, then 3 tabs po qhs 08/13/17   [name], MD  prazosin (MINIPRESS) 2 MG capsule Take 2 capsules (4 mg total) by mouth at bedtime. X 2 weeks, then 3 capsules po qhs 08/27/17   [name], MD  propranolol (INDERAL) 20 MG tablet Take 1 tablet (20 mg total) by mouth at bedtime. 08/24/17   [name], MD  QUEtiapine (SEROQUEL) 100 MG tablet Take 2 tablets (200 mg total) by mouth at bedtime. 08/13/17   [name], MD  rizatriptan (MAXALT-MLT) 10 MG disintegrating tablet Take 1 tablet (10 mg total) by mouth as needed for migraine. May repeat in 2 hours if needed 07/16/17   [name], MD  topiramate (TOPAMAX) 25 MG tablet Take 1 tablet (25 mg total) by mouth every morning. X 1 week, then stop 08/13/17   [name], MD   Allergies  Allergen Reactions  . Tramadol Nausea And Vomiting  .  Trazodone And Nefazodone Other (See Comments)    Per pt trazodone caused hallucinations and behavior changes    Family History  Problem Relation Age of Onset  . Diabetes Mother   . Heart disease Mother   . Kidney disease Mother   . Thyroid disease Mother   . Heart disease Father   . Seizures Father   . COPD Father   . Cancer Maternal Grandmother    Social History   Socioeconomic History  . Marital status: Married    Spouse name: Not on file  . Number of children: 3  . Years of education: 33  . Highest education level: Some college, no degree  Occupational History  . Not on file  Social Needs  . Financial resource strain: Not hard at all  . Food insecurity:    Worry: Never true    Inability: Never true  . Transportation needs:    Medical: No    Non-medical: No  Tobacco Use  . Smoking status: Never Smoker  . Smokeless tobacco: Never Used  Substance and Sexual Activity  . Alcohol use: No  . Drug use: No    Comment: 08-31-2016 PER PT NO   . Sexual activity: Yes    Partners: Male    Comment: married  Lifestyle  . Physical activity:    Days per week: 0 days    Minutes per session: 0 min  . Stress: Very much  Relationships  . Social connections:    Talks on phone: More than three times a week    Gets together: More than three times a week    Attends religious service: Never    Active member of club or organization: No    Attends meetings of clubs or organizations: Never    Relationship status: Married  Other Topics Concern  . Not on file  Social History Narrative  . Not on file   Depression screen [facility] 2/9 08/27/2017 08/13/2017 07/30/2017 07/16/2017 07/02/2017  Decreased Interest 1 1 0 1 3  Down, Depressed, Hopeless 1 1 0 0 3  PHQ - 2 Score 2 2 0 1  6  Altered sleeping 1 3 - - 3  Tired, decreased energy 1 3 - - 3  Change in appetite 1 2 - - 0  Feeling bad or failure about yourself  1 1 - - 3  Trouble concentrating 1 3 - - 3  Moving slowly or fidgety/restless 1 3  - - 3  Suicidal thoughts 1 0 - - 3  PHQ-9 Score 9 17 - - 24  Difficult doing work/chores - Somewhat difficult - - Extremely dIfficult  Some recent data might be hidden    Review of Systems  Constitutional: Negative for fatigue and unexpected weight change.  Eyes: Positive for visual disturbance (with headache).  Respiratory: Negative for chest tightness and shortness of breath.   Cardiovascular: Negative for chest pain, palpitations and leg swelling.  Gastrointestinal: Negative for abdominal pain and blood in stool.  Neurological: Positive for headaches (improved). Negative for dizziness, syncope and light-headedness.  Psychiatric/Behavioral: Positive for sleep disturbance (improved).       Objective:   Physical Exam  Constitutional: She is oriented to person, place, and time. She appears well-developed and well-nourished. No distress.  HENT:  Head: Normocephalic and atraumatic.  Eyes: Pupils are equal, round, and reactive to light. EOM are normal.  Neck: Neck supple.  Cardiovascular: Normal rate.  Pulmonary/Chest: Effort normal. No respiratory distress.  Musculoskeletal: Normal range of motion.  Neurological: She is alert and oriented to person, place, and time.  Skin: Skin is warm and dry.  Psychiatric: She has a normal mood and affect. Her behavior is normal.  Nursing note and vitals reviewed.   BP 116/72   Pulse 82   Temp 98 F (36.7 C) (Oral)   Resp 16   Ht 5' 2.8" (1.595 m)   Wt 200 lb (90.7 kg)   SpO2 98%   BMI 35.66 kg/m      Assessment & Plan:   1. Intractable migraine with status migrainosus, unspecified migraine type - improved since starting propranolol 20mg  qhs - ok to increase this to 40mg  qhs if it is seeming the HAs are worsening - couldn't afford botox or biologic but I am hopeful that neurologist may know a way around this - potentially try for a PA on Ajovy or Aimovig - or have another recommendation -a specialized tens unit for the forehead that can  relief chronic migraine? Intranasal lidocaine?  Has neurology appt in 2 wks - 4/16 w/ Dr. Karel Jarvis  2. Gastroesophageal reflux disease, esophagitis presence not specified - has appt w/ GI in a month she needs to keep as this has been on ongoing issue dependent upon ppi and h2 blocker continually.   3. Intractable vomiting with nausea, unspecified vomiting type - suspect related to migraine HAs  4. Dream anxiety disorder   5. Encounter for long-term (current) use of medications   6. Severe recurrent major depressive disorder with psychotic features (HCC) - cont cymbalta 60 - has appt w/ psych 4/24 and therapist in 2 wks 4/15  7. PTSD (post-traumatic stress disorder) - cont hydroxyzine 50mg  qid prn agitation/irritability/anxiety  8. Insomnia due to other mental disorder - cont seroquel 200 and hydroxyzine 200 qhs  9. Vivid dream - has had sig improvement on prazosin 3mg  qhs - will increase to 4mg  tonight and then increase to 6 mg in 2 wks if still tolreating w/o any signs of hypotension/orthostasis  10. Hallucination, visual      Meds ordered this encounter  Medications  . QUEtiapine (SEROQUEL) 200 MG tablet  Sig: Take 1 tablet (200 mg total) by mouth at bedtime.    Dispense:  30 tablet    Refill:  0  . QUEtiapine (SEROQUEL) 200 MG tablet    Sig: Take 1 tablet (200 mg total) by mouth at bedtime.    Dispense:  90 tablet    Refill:  1  . propranolol (INDERAL) 20 MG tablet    Sig: Take 1-2 tablets (20-40 mg total) by mouth at bedtime.    Dispense:  180 tablet    Refill:  1    I personally performed the services described in this documentation, which was scribed in my presence. The recorded information has been reviewed and considered, and addended by me as needed.   Norberto Sorenson, M.D.  Primary Care at [facility] [address] [place], [place] [address] [phone] phone [phone] fax  09/14/17 10:15 AM

## 2017-08-27 NOTE — Patient Instructions (Addendum)
   IF you received an x-ray today, you will receive an invoice from [facility]. Please contact [facility] at [phone] with questions or concerns regarding your invoice.   IF you received labwork today, you will receive an invoice from [facility]. Please contact [facility] at [phone] with questions or concerns regarding your invoice.   Our billing staff will not be able to assist you with questions regarding bills from these companies.  You will be contacted with the lab results as soon as they are available. The fastest way to get your results is to activate your My Chart account. Instructions are located on the last page of this paperwork. If you have not heard from us regarding the results in 2 weeks, please contact this office.     Recurrent Migraine Headache Migraines are a type of headache, and they are usually stronger and more sudden than normal headaches (tension headaches). Migraines are characterized by an intense pulsing, throbbing pain that is usually only present on one side of the head. Sometimes, migraine headaches can cause nausea, vomiting, sensitivity to light and sound, and vision changes. Recurrent migraines keep coming back (recurring). A migraine can last from 4 hours up to 3 days. What are the causes? The exact cause of this condition is not known. However, a migraine may be caused when nerves in the brain become irritated and release chemicals that cause inflammation of blood vessels. This inflammation causes pain. Certain things may also trigger migraines, such as:  A disruption in your regular eating and sleeping schedule.  Smoking.  Stress.  Menstruation.  Certain foods and drinks, such as: ? Aged cheese. ? Chocolate. ? Alcohol. ? Caffeine. ? Foods or drinks that contain nitrates, glutamate, aspartame, MSG, or tyramine.  Lack of sleep.  Hunger.  Physical exertion.  Fatigue.  High altitude.  Weather  changes.  Medicines, such as: ? Nitroglycerin, which is used to treat chest pain. ? Birth control pills. ? Estrogen. ? Some blood pressure medicines.  What are the signs or symptoms? Symptoms of this condition vary for each person and may include:  Pain that is usually only present on one side of the head. In some cases, the pain may be on both sides of the head or around the head or neck.  Pulsating or throbbing pain.  Severe pain that prevents daily activities.  Pain that is aggravated by any physical activity.  Nausea, vomiting, or both.  Dizziness.  Pain with exposure to bright lights, loud noises, or activity.  General sensitivity to bright lights, loud noises, or smells.  Before you get a migraine, you may get warning signs that a migraine is coming (aura). An aura may include:  Seeing flashing lights.  Seeing bright spots, halos, or zigzag lines.  Having tunnel vision or blurred vision.  Having numbness or a tingling feeling.  Having trouble talking.  Having muscle weakness.  Smelling a certain odor.  How is this diagnosed? This condition is often diagnosed based on:  Your symptoms and medical history.  A physical exam.  You may also have tests, including:  A CT scan or MRI of your brain. These imaging tests cannot diagnose migraines, but they can help to rule out other causes of headaches.  Blood tests.  How is this treated? This condition is treated with:  Medicines. These are used for: ? Lessening pain and nausea. ? Preventing recurrent migraines.  Lifestyle changes, such as changes to your diet or sleeping patterns.  Behavior therapy, such   as relaxation training or biofeedback. Biofeedback is a treatment that involves teaching you to relax and use your brain to lower your heart rate and control your breathing.  Follow these instructions at home: Medicines  Take over-the-counter and prescription medicines only as told by your health care  provider.  Do not drive or use heavy machinery while taking prescription pain medicine. Lifestyle  Do not use any products that contain nicotine or tobacco, such as cigarettes and e-cigarettes. If you need help quitting, ask your health care provider.  Limit alcohol intake to no more than 1 drink a day for nonpregnant women and 2 drinks a day for men. One drink equals 12 oz of beer, 5 oz of wine, or 1 oz of hard liquor.  Get 7-9 hours of sleep each night, or the amount of sleep recommended by your health care provider.  Limit your stress. Talk with your health care provider if you need help with stress management.  Maintain a healthy weight. If you need help losing weight, ask your health care provider.  Exercise regularly. Aim for 150 minutes of moderate-intensity exercise (walking, biking, yoga) or 75 minutes of vigorous exercise (running, circuit training, swimming) each week. General instructions  Keep a journal to find out what triggers your migraine headaches so you can avoid these triggers. For example, write down: ? What you eat and drink. ? How much sleep you get. ? Any change to your diet or medicines.  Lie down in a dark, quiet room when you have a migraine.  Try placing a cool towel over your head when you have a migraine.  Keep lights dim, if bright lights bother you and make your migraines worse.  Keep all follow-up visits as told by your health care provider. This is important. Contact a health care provider if:  Your pain does not improve, even with medicine.  Your migraines continue to return, even with medicine.  You have a fever.  You have weight loss. Get help right away if:  Your migraine becomes severe and medicine does not help.  You have a stiff neck.  You have a loss of vision.  You have muscle weakness or loss of muscle control.  You start losing your balance or have trouble walking.  You feel faint or you pass out.  You develop new,  severe symptoms.  You start having abrupt severe headaches that last for a second or less, like a thunderclap. Summary  Migraine headaches are usually stronger and more sudden than normal headaches (tension headaches). Migraines are characterized by an intense pulsing, throbbing pain that is usually only present on one side of the head.  The exact cause of this condition is not known. However, a migraine may be caused when nerves in the brain become irritated and release chemicals that cause inflammation of blood vessels.  Certain things may trigger migraines, such as changes to diet or sleeping patterns, smoking, certain foods, alcohol, stress, and certain medicines.  Sometimes, migraine headaches can cause nausea, vomiting, sensitivity to light and sound, and vision changes.  Migraines are often diagnosed based on your symptoms, medical history, and a physical exam. This information is not intended to replace advice given to you by your health care provider. Make sure you discuss any questions you have with your health care provider. Document Released: 01/26/2001 Document Revised: 02/13/2016 Document Reviewed: 02/13/2016 Elsevier Interactive Patient Education  2018 [employer]  

## 2017-08-29 ENCOUNTER — Encounter (HOSPITAL_COMMUNITY): Payer: Self-pay | Admitting: Licensed Clinical Social Worker

## 2017-08-29 ENCOUNTER — Ambulatory Visit (HOSPITAL_COMMUNITY): Payer: Medicare HMO | Admitting: Licensed Clinical Social Worker

## 2017-08-29 DIAGNOSIS — F4310 Post-traumatic stress disorder, unspecified: Secondary | ICD-10-CM | POA: Diagnosis not present

## 2017-08-29 DIAGNOSIS — F333 Major depressive disorder, recurrent, severe with psychotic symptoms: Secondary | ICD-10-CM | POA: Diagnosis not present
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
## 2017-08-30 ENCOUNTER — Other Ambulatory Visit (HOSPITAL_COMMUNITY): Payer: Self-pay | Admitting: Psychiatry

## 2017-08-30 ENCOUNTER — Ambulatory Visit: Payer: Self-pay | Admitting: Neurology

## 2017-08-30 MED ORDER — QUETIAPINE FUMARATE 200 MG PO TABS: 200.0000 mg | ORAL_TABLET | Freq: Every day | ORAL | 0 refills | Status: DC

## 2017-08-31 DIAGNOSIS — H2512 Age-related nuclear cataract, left eye: Secondary | ICD-10-CM | POA: Diagnosis not present

## 2017-08-31 DIAGNOSIS — H25012 Cortical age-related cataract, left eye: Secondary | ICD-10-CM | POA: Diagnosis not present

## 2017-08-31 DIAGNOSIS — H269 Unspecified cataract: Secondary | ICD-10-CM

## 2017-08-31 HISTORY — DX: Unspecified cataract: H26.9

## 2017-09-04 ENCOUNTER — Other Ambulatory Visit: Payer: Self-pay | Admitting: Family Medicine
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
## 2017-09-07 ENCOUNTER — Ambulatory Visit (HOSPITAL_COMMUNITY): Payer: Medicare HMO | Admitting: Psychiatry

## 2017-09-07 ENCOUNTER — Encounter (HOSPITAL_COMMUNITY): Payer: Self-pay | Admitting: Psychiatry

## 2017-09-07 ENCOUNTER — Other Ambulatory Visit: Payer: Self-pay | Admitting: Family Medicine

## 2017-09-07 VITALS — BP 111/74 | HR 79 | Ht 62.0 in | Wt 194.0 lb

## 2017-09-07 DIAGNOSIS — F4310 Post-traumatic stress disorder, unspecified: Secondary | ICD-10-CM | POA: Diagnosis not present

## 2017-09-07 DIAGNOSIS — Z1231 Encounter for screening mammogram for malignant neoplasm of breast: Secondary | ICD-10-CM

## 2017-09-07 DIAGNOSIS — F419 Anxiety disorder, unspecified: Secondary | ICD-10-CM | POA: Diagnosis not present

## 2017-09-07 DIAGNOSIS — R450 Nervousness: Secondary | ICD-10-CM

## 2017-09-07 DIAGNOSIS — F331 Major depressive disorder, recurrent, moderate: Secondary | ICD-10-CM

## 2017-09-07 MED ORDER — DULOXETINE HCL 60 MG PO CPEP: 60.0000 mg | ORAL_CAPSULE | Freq: Every day | ORAL | 0 refills | Status: DC

## 2017-09-07 NOTE — Patient Instructions (Signed)
1. Continue duloxetine 60 mg daily (paradoxical reaction on higher dose) 2. Continue quetiapine 200 mg at night 3. Continue prazosin 2 mg at night  3. Continue hydroxyzine 50 mg daily as needed for anxiety  4. Return to clinic in one month for 30 mins

## 2017-09-12 ENCOUNTER — Ambulatory Visit: Payer: Medicare HMO | Admitting: Gastroenterology

## 2017-09-12 ENCOUNTER — Encounter: Payer: Self-pay | Admitting: Gastroenterology

## 2017-09-12 VITALS — BP 118/72 | HR 72 | Ht 62.0 in | Wt 193.0 lb

## 2017-09-12 DIAGNOSIS — R12 Heartburn: Secondary | ICD-10-CM | POA: Diagnosis not present

## 2017-09-12 DIAGNOSIS — R1319 Other dysphagia: Secondary | ICD-10-CM

## 2017-09-12 DIAGNOSIS — R1310 Dysphagia, unspecified: Secondary | ICD-10-CM

## 2017-09-12 DIAGNOSIS — R634 Abnormal weight loss: Secondary | ICD-10-CM | POA: Diagnosis not present

## 2017-09-12 DIAGNOSIS — G43A0 Cyclical vomiting, not intractable: Secondary | ICD-10-CM

## 2017-09-12 DIAGNOSIS — R1115 Cyclical vomiting syndrome unrelated to migraine: Secondary | ICD-10-CM
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
## 2017-09-12 NOTE — Patient Instructions (Signed)
If you are age 64 or older, your body mass index should be between 23-30. Your Body mass index is 35.3 kg/m. If this is out of the aforementioned range listed, please consider follow up with your Primary Care Provider.  If you are age 28 or younger, your body mass index should be between 19-25. Your Body mass index is 35.3 kg/m. If this is out of the aformentioned range listed, please consider follow up with your Primary Care Provider.   You have been scheduled for an endoscopy. Please follow written instructions given to you at your visit today. If you use inhalers (even only as needed), please bring them with you on the day of your procedure. Your physician has requested that you go to www.startemmi.com and enter the access code given to you at your visit today. This web site gives a general overview about your procedure. However, you should still follow specific instructions given to you by our office regarding your preparation for the procedure.  Thank you for choosing [facility]  Dr Amada Jupiter III

## 2017-09-13 ENCOUNTER — Other Ambulatory Visit: Payer: Self-pay

## 2017-09-13 ENCOUNTER — Encounter: Payer: Self-pay | Admitting: Gastroenterology

## 2017-09-13 ENCOUNTER — Ambulatory Visit (AMBULATORY_SURGERY_CENTER): Payer: Medicare HMO | Admitting: Gastroenterology

## 2017-09-13 VITALS — BP 123/66 | HR 64 | Temp 98.2°F | Resp 10 | Ht 62.0 in | Wt 193.0 lb

## 2017-09-13 DIAGNOSIS — R112 Nausea with vomiting, unspecified: Secondary | ICD-10-CM

## 2017-09-13 DIAGNOSIS — R634 Abnormal weight loss: Secondary | ICD-10-CM | POA: Diagnosis not present

## 2017-09-13 DIAGNOSIS — R1310 Dysphagia, unspecified: Secondary | ICD-10-CM | POA: Diagnosis not present

## 2017-09-13 MED ORDER — SODIUM CHLORIDE 0.9 % IV SOLN: 500.0000 mL | Freq: Once | INTRAVENOUS | Status: DC

## 2017-09-13 NOTE — Progress Notes (Signed)
Spontaneous respirations throughout. VSS. Resting comfortably. To PACU on room air. Report to  RN. 

## 2017-09-13 NOTE — Op Note (Signed)
[facility] Patient Name: Jennifer Davidson Procedure Date: 09/13/2017 8:42 AM MRN: [id number] Endoscopist: Sherilyn Cooter L. Myrtie Neither , MD Age: 62 Referring MD:  Date of Birth: 11-20-1955 Gender: Female Account #: [id number] Procedure:                Upper GI endoscopy Indications:              Upper abdominal pain, Nausea with vomiting, Weight                            loss Medicines:                Monitored Anesthesia Care Procedure:                Pre-Anesthesia Assessment:                           - Prior to the procedure, a History and Physical                            was performed, and patient medications and                            allergies were reviewed. The patient's tolerance of                            previous anesthesia was also reviewed. The risks                            and benefits of the procedure and the sedation                            options and risks were discussed with the patient.                            All questions were answered, and informed consent                            was obtained. Prior Anticoagulants: The patient has                            taken no previous anticoagulant or antiplatelet                            agents. ASA Grade Assessment: II - A patient with                            mild systemic disease. After reviewing the risks                            and benefits, the patient was deemed in                            satisfactory condition to undergo the procedure.                           -  Prior to the procedure, a History and Physical                            was performed, and patient medications and                            allergies were reviewed. The patient's tolerance of                            previous anesthesia was also reviewed. The risks                            and benefits of the procedure and the sedation                            options and risks were discussed with the patient.                   All questions were answered, and informed consent                            was obtained. Prior Anticoagulants: The patient has                            taken no previous anticoagulant or antiplatelet                            agents. ASA Grade Assessment: II - A patient with                            mild systemic disease. After reviewing the risks                            and benefits, the patient was deemed in                            satisfactory condition to undergo the procedure.                           After obtaining informed consent, the endoscope was                            passed under direct vision. Throughout the                            procedure, the patient's blood pressure, pulse, and                            oxygen saturations were monitored continuously. The                            Endoscope was introduced through the mouth, and  advanced to the third part of duodenum. The upper                            GI endoscopy was accomplished without difficulty.                            The patient tolerated the procedure well. Scope In: Scope Out: Findings:                 The larynx was normal.                           Two areas of ectopic gastric mucosa were found in                            the upper third of the esophagus.                           The exam of the esophagus was otherwise normal.                           Food (residue) was found in the entire examined                            stomach.                           The exam of the stomach was otherwise normal.                           The cardia and gastric fundus were normal on                            retroflexion, though visualization somewhat limited                            by retained food.                           The examined duodenum was normal. Complications:            No immediate complications. Estimated Blood Loss:      Estimated blood loss: none. Impression:               - Normal larynx.                           - Ectopic gastric mucosa in the upper third of the                            esophagus.                           - Food (residue) in the stomach.                           - Normal examined duodenum.                           -  No specimens collected. Recommendation:           - Patient has a contact number available for                            emergencies. The signs and symptoms of potential                            delayed complications were discussed with the                            patient. Return to normal activities tomorrow.                            Written discharge instructions were provided to the                            patient.                           - Low fiber diet.                           - Continue present medications.                           - CT scan abdomen pelvis with ORAL CONTRAST ONLY                            (has chronic elevated creatinine). This is to rule                            out more distal SB obstruction, especially                            considering documented weight loss. Henry L. Myrtie Neither, MD 09/13/2017 9:02:58 AM This report has been signed electronically.

## 2017-09-13 NOTE — Patient Instructions (Signed)
Discharge instructions given. No biopsies. Oral contrast given in recovery room. Per Dr. Myrtie Neither no lab work needed. Resume previous medications. YOU HAD AN ENDOSCOPIC PROCEDURE TODAY AT THE [facility]:   Refer to the procedure report that was given to you for any specific questions about what was found during the examination.  If the procedure report does not answer your questions, please call your gastroenterologist to clarify.  If you requested that your care partner not be given the details of your procedure findings, then the procedure report has been included in a sealed envelope for you to review at your convenience later.  YOU SHOULD EXPECT: Some feelings of bloating in the abdomen. Passage of more gas than usual.  Walking can help get rid of the air that was put into your GI tract during the procedure and reduce the bloating. If you had a lower endoscopy (such as a colonoscopy or flexible sigmoidoscopy) you may notice spotting of blood in your stool or on the toilet paper. If you underwent a bowel prep for your procedure, you may not have a normal bowel movement for a few days.  Please Note:  You might notice some irritation and congestion in your nose or some drainage.  This is from the oxygen used during your procedure.  There is no need for concern and it should clear up in a day or so.  SYMPTOMS TO REPORT IMMEDIATELY:    Following upper endoscopy (EGD)  Vomiting of blood or coffee ground material  New chest pain or pain under the shoulder blades  Painful or persistently difficult swallowing  New shortness of breath  Fever of 100F or higher  Black, tarry-looking stools  For urgent or emergent issues, a gastroenterologist can be reached at any hour by calling [phone].   DIET:  We do recommend a small meal at first, but then you may proceed to your regular diet.  Drink plenty of fluids but you should avoid alcoholic beverages for 24 hours.  ACTIVITY:  You  should plan to take it easy for the rest of today and you should NOT DRIVE or use heavy machinery until tomorrow (because of the sedation medicines used during the test).    FOLLOW UP: Our staff will call the number listed on your records the next business day following your procedure to check on you and address any questions or concerns that you may have regarding the information given to you following your procedure. If we do not reach you, we will leave a message.  However, if you are feeling well and you are not experiencing any problems, there is no need to return our call.  We will assume that you have returned to your regular daily activities without incident.  If any biopsies were taken you will be contacted by phone or by letter within the next 1-3 weeks.  Please call us at [phone] if you have not heard about the biopsies in 3 weeks.    SIGNATURES/CONFIDENTIALITY: You and/or your care partner have signed paperwork which will be entered into your electronic medical record.  These signatures attest to the fact that that the information above on your After Visit Summary has been reviewed and is understood.  Full responsibility of the confidentiality of this discharge information lies with you and/or your care-partner.

## 2017-09-14 ENCOUNTER — Encounter: Payer: Self-pay | Admitting: Family Medicine

## 2017-09-14 ENCOUNTER — Telehealth: Payer: Self-pay | Admitting: Gastroenterology

## 2017-09-14 ENCOUNTER — Other Ambulatory Visit: Payer: Self-pay

## 2017-09-14 ENCOUNTER — Telehealth: Payer: Self-pay

## 2017-09-14 DIAGNOSIS — R1012 Left upper quadrant pain: Secondary | ICD-10-CM

## 2017-09-14 DIAGNOSIS — R7303 Prediabetes: Secondary | ICD-10-CM | POA: Insufficient documentation

## 2017-09-14 DIAGNOSIS — R112 Nausea with vomiting, unspecified: Secondary | ICD-10-CM

## 2017-09-14 DIAGNOSIS — R6889 Other general symptoms and signs: Secondary | ICD-10-CM | POA: Insufficient documentation

## 2017-09-14 DIAGNOSIS — R441 Visual hallucinations: Secondary | ICD-10-CM | POA: Insufficient documentation

## 2017-09-14 DIAGNOSIS — F99 Mental disorder, not otherwise specified: Secondary | ICD-10-CM | POA: Insufficient documentation

## 2017-09-14 DIAGNOSIS — F515 Nightmare disorder: Secondary | ICD-10-CM | POA: Insufficient documentation

## 2017-09-14 DIAGNOSIS — F5105 Insomnia due to other mental disorder: Secondary | ICD-10-CM | POA: Insufficient documentation

## 2017-09-14 HISTORY — DX: Visual hallucinations: R44.1

## 2017-09-14 NOTE — Telephone Encounter (Signed)
Dr. Vanetta Shawl and [name],  Please see EGD report on this patient from yesterday.  I am getting a CT abdomen to see if any small bowel obstructive process.  If not, then this patient may have idiopathic gastroparesis.  If so, will need to try metoclopramide.  Any concerns for potential med interactions with her current meds, especially psychiatric meds?  Thanks for your input.  [facility]

## 2017-09-14 NOTE — Telephone Encounter (Signed)
Spoke to patient, scheduled for CT abdomen and pelvis oral contrast only. Patient has a conflict with 5/3, therefore gave her verbal instructions on being NPO 4 hours prior and drinking contrast one bottle at 2 hours prior to test, other bottle 1 hour prior to test. Scheduled at [facility], patient given address and phone number so she can reschedule at her convenience.

## 2017-09-14 NOTE — Telephone Encounter (Signed)
  Follow up Call-  Call back number 09/13/2017  Post procedure Call Back phone  # [phone]  Permission to leave phone message Yes  Some recent data might be hidden     Patient questions:  Do you have a fever, pain , or abdominal swelling? No. Pain Score  0 *  Have you tolerated food without any problems? Yes.    Have you been able to return to your normal activities? Yes.    Do you have any questions about your discharge instructions: Diet   No. Medications  No. Follow up visit  No.  Do you have questions or concerns about your Care? No.  Actions: * If pain score is 4 or above: No action needed, pain <4.  No problems noted per pt. maw

## 2017-09-14 NOTE — Telephone Encounter (Signed)
Left message for [place] [facility] to call back re: scheduling CT abdomen/pelvis ORAL contrast only.

## 2017-09-14 NOTE — Telephone Encounter (Signed)
Please schedule CT abdomen and pelvis with ORAL CONTRAST ONLY for LUQ pain, vomiting and weight loss. NO IV CONTRAST due to chronic kidney disease.  EGD done yesterday showing retained food in stomach.  She was given oral contrast in [facility] yesterday, and is expecting to hear from you soon.  This week if possible, by next week definitely.

## 2017-09-14 NOTE — Telephone Encounter (Signed)
Thanks very much.  I will see what testing shows and then decide.

## 2017-09-14 NOTE — Telephone Encounter (Signed)
Sounds good - will closely monitor pt to ensure no EPS, NMS, or over-sedation sxs develop. Glad to see the EGD didn't show any serious pathology! Thank you so much for your help.

## 2017-09-14 NOTE — Telephone Encounter (Signed)
Thanks for your message.   There is a warning that duloxetine and metoclopramide use is contraindicated due to concern for increased risk of extrapyramidal reactions and neuroleptic malignant syndrome. Hydroxyzine, metoclopramide may cause increased risk of CNS depression. Having said that, I have seen these medication were used in combination in other patients without adverse reaction. I am not opposed to the idea if you think it is beneficial for the patient and  if you could clinically monitor these symptoms.   Thanks,  [employer]

## 2017-09-16 ENCOUNTER — Other Ambulatory Visit: Payer: Self-pay | Admitting: Family Medicine

## 2017-09-16 ENCOUNTER — Ambulatory Visit (INDEPENDENT_AMBULATORY_CARE_PROVIDER_SITE_OTHER)
Admission: RE | Admit: 2017-09-16 | Discharge: 2017-09-16 | Disposition: A | Discharge status: Home / Self Care | Payer: Medicare HMO | Source: Ambulatory Visit | Attending: Gastroenterology | Admitting: Gastroenterology

## 2017-09-16 DIAGNOSIS — R112 Nausea with vomiting, unspecified: Secondary | ICD-10-CM

## 2017-09-16 DIAGNOSIS — R1111 Vomiting without nausea: Secondary | ICD-10-CM | POA: Diagnosis not present

## 2017-09-17 NOTE — Telephone Encounter (Signed)
LOV 08/27/17 with Dr. [name] / Medication refill for propranolol / Medication refilled on 08/27/17 by provider /  Disp Refills Start End   propranolol (INDERAL) 20 MG tablet 180 tablet 1 08/27/2017    Sig - Route: Take 1-2 tablets (20-40 mg total) by mouth at bedtime. - Oral   Sent to pharmacy as: propranolol (INDERAL) 20 MG tablet   E-Prescribing Status: Receipt confirmed by pharmacy (08/27/2017 2:44 PM EDT)

## 2017-09-21 ENCOUNTER — Telehealth: Payer: Self-pay | Admitting: Family Medicine

## 2017-09-21 NOTE — Telephone Encounter (Addendum)
Received call from [place], Brae's husband - VERY anxious causing her to vomit whenever she leaves the house and starting to talk in her sleep again. 2 of the propranolol  qhs made her feel dizzy in the morning so had to cut it back down to 1 - had 1 headache since then but didn't take abortive therapy in time. The other day didn't recognize her shoes - were convinced they had to be someone elses until [place] made her notice that they were her size.  Taking all med as rx'd.  Advised ok t oincrease prazosin to  qhs and may need to wean up to  - new rx sent to mail order pharm.  Will discuss trial of buspirone at pt's visit w/ her - does not appear that she has been on this prior.  Consider changing gabapentin to pregabalin to help?  come in for OV on Sat 5/11 - ok to use one of the same day slots - pt can come anytime.  SCHEDULING - CAN YOU PLEASE PUT PT INTO ONE OF THE SAME DAY SLOTS ON SAT 5/11 AND LET HER OR HER HUSBAND MARK KNOW WHEN APPT IT?  PT CAN DO ANYTIME THAT DAY.  THE NOON SLOT MIGHT WORK BEST . Marland Kitchen Marland Kitchen

## 2017-09-22 ENCOUNTER — Telehealth (HOSPITAL_COMMUNITY): Payer: Self-pay | Admitting: Psychiatry

## 2017-09-22 MED ORDER — PRAZOSIN HCL 5 MG PO CAPS: 10.0000 mg | ORAL_CAPSULE | Freq: Every day | ORAL | 0 refills | Status: DC

## 2017-09-22 NOTE — Progress Notes (Signed)
Thanks so much for the detailed work-up.  I spoke to her husband for quite a while yesterday who noted that she only vomits when he is away from the house and so thinks that it is induced by her severe anxiety - psychosomatic. Do you think that is possible?  She has an OV w/ me in 2d to discuss this possibility and consider med adjustments - I don't think she has ever been on buspirone so was considering trial of that though I see that it has nausea, abd pain, and diarrhea on its common side effects list. . .   She is taking large amounts of hydroxyzine for anxiety as well - likely 300-400mg /d total which is probably ~ twice the usual max doses people tolerate and at the top of the dosing guidelines for anxiety. Do you think the anticholinergic effect of regular upper limits of hydroxyzine could be the cause/exacerbating? THanks so much for your help! Carley Hammed

## 2017-09-22 NOTE — Progress Notes (Signed)
Thank you so much for the update. Dr. [name]- Would you like to take care of her psych meds? I think it will be the best that either one of [place] adjust her psych meds to avoid any confusion. I am totally fine with whichever works best for the patient. It would be also great if you could consider following up on EKG (QTc prolongation) as the last EKG was taken before your uptiration of quetiapine, although I am not too much concerned about it as long as QTc< 500 msec, fyi.   From psychiatry standpoint, it is not too rare for patients to experience nausea secondary to anxiety, although we certainly need to treat medical cause as possible. We occasionally use olanzapine for severe nausea/anxiety (monitor metabolic side effect) and lorazepam (may be less preferred choice for this patient given episode of confusion in the past). And it is true that buspar may be considered for anxiety, fyi.   Thanks,  [employer]

## 2017-09-22 NOTE — Telephone Encounter (Signed)
I missed to include in the previous mail that mirtazapine can certainly be considered to target both anxiety and nausea as well (monitor metabolic side effect), fyi.

## 2017-09-23 ENCOUNTER — Telehealth: Payer: Self-pay | Admitting: Gastroenterology

## 2017-09-23 NOTE — Telephone Encounter (Signed)
Thanks for the suggestion. I hope not to add more psych/neuro meds if possible, especially since I am unaccustomed to prescribing them.

## 2017-09-23 NOTE — Telephone Encounter (Signed)
Patient called with results - noted in result note to Dr. [name]

## 2017-09-24 ENCOUNTER — Encounter: Payer: Self-pay | Admitting: Family Medicine

## 2017-09-24 ENCOUNTER — Other Ambulatory Visit: Payer: Self-pay

## 2017-09-24 ENCOUNTER — Ambulatory Visit (INDEPENDENT_AMBULATORY_CARE_PROVIDER_SITE_OTHER): Payer: Medicare HMO | Admitting: Family Medicine

## 2017-09-24 VITALS — BP 118/66 | HR 80 | Temp 97.6°F | Resp 16 | Ht 62.0 in | Wt 194.2 lb

## 2017-09-24 DIAGNOSIS — R6889 Other general symptoms and signs: Secondary | ICD-10-CM

## 2017-09-24 DIAGNOSIS — F5105 Insomnia due to other mental disorder: Secondary | ICD-10-CM

## 2017-09-24 DIAGNOSIS — F411 Generalized anxiety disorder: Secondary | ICD-10-CM | POA: Diagnosis not present

## 2017-09-24 DIAGNOSIS — M5441 Lumbago with sciatica, right side: Secondary | ICD-10-CM | POA: Diagnosis not present

## 2017-09-24 DIAGNOSIS — R7303 Prediabetes: Secondary | ICD-10-CM

## 2017-09-24 DIAGNOSIS — K3184 Gastroparesis: Secondary | ICD-10-CM | POA: Diagnosis not present

## 2017-09-24 DIAGNOSIS — F331 Major depressive disorder, recurrent, moderate: Secondary | ICD-10-CM

## 2017-09-24 DIAGNOSIS — G8929 Other chronic pain: Secondary | ICD-10-CM

## 2017-09-24 DIAGNOSIS — G43911 Migraine, unspecified, intractable, with status migrainosus: Secondary | ICD-10-CM

## 2017-09-24 DIAGNOSIS — E039 Hypothyroidism, unspecified: Secondary | ICD-10-CM

## 2017-09-24 DIAGNOSIS — R42 Dizziness and giddiness: Secondary | ICD-10-CM | POA: Diagnosis not present

## 2017-09-24 DIAGNOSIS — F515 Nightmare disorder: Secondary | ICD-10-CM

## 2017-09-24 DIAGNOSIS — F333 Major depressive disorder, recurrent, severe with psychotic symptoms: Secondary | ICD-10-CM | POA: Diagnosis not present

## 2017-09-24 DIAGNOSIS — F99 Mental disorder, not otherwise specified: Secondary | ICD-10-CM

## 2017-09-24 DIAGNOSIS — Z9189 Other specified personal risk factors, not elsewhere classified: Secondary | ICD-10-CM

## 2017-09-24 DIAGNOSIS — T50905S Adverse effect of unspecified drugs, medicaments and biological substances, sequela: Secondary | ICD-10-CM

## 2017-09-24 LAB — POCT URINALYSIS DIP (MANUAL ENTRY)
BILIRUBIN UA: NEGATIVE
Blood, UA: NEGATIVE
GLUCOSE UA: NEGATIVE mg/dL
Ketones, POC UA: NEGATIVE mg/dL
LEUKOCYTES UA: NEGATIVE
NITRITE UA: NEGATIVE
Protein Ur, POC: NEGATIVE mg/dL
Spec Grav, UA: 1.02 (ref 1.010–1.025)
Urobilinogen, UA: 0.2 E.U./dL
pH, UA: 6 (ref 5.0–8.0)

## 2017-09-24 MED ORDER — MELATONIN 5 MG PO CAPS: 5.0000 mg | ORAL_CAPSULE | Freq: Every day | ORAL | 0 refills | Status: DC

## 2017-09-24 MED ORDER — PRAZOSIN HCL 5 MG PO CAPS: 10.0000 mg | ORAL_CAPSULE | Freq: Every day | ORAL | 0 refills | Status: DC

## 2017-09-24 MED ORDER — BUSPIRONE HCL 5 MG PO TABS: 5.0000 mg | ORAL_TABLET | Freq: Three times a day (TID) | ORAL | 0 refills | Status: DC

## 2017-09-24 NOTE — Patient Instructions (Addendum)
Stop the hydroxyzine during the day and using the buspirone  instead three times a day.  Add melatonin into the night.  We will need to gradually double up on the busparione from 5 to  three times a day over the next several weeks - we can discuss this more at your next visit in 2 weeks.    IF you received an x-ray today, you will receive an invoice from [facility]. Please contact [facility] at [phone] with questions or concerns regarding your invoice.   IF you received labwork today, you will receive an invoice from [facility]. Please contact [facility] at [phone] with questions or concerns regarding your invoice.   Our billing staff will not be able to assist you with questions regarding bills from these companies.  You will be contacted with the lab results as soon as they are available. The fastest way to get your results is to activate your My Chart account. Instructions are located on the last page of this paperwork. If you have not heard from [place] regarding the results in 2 weeks, please contact this office.

## 2017-09-24 NOTE — Progress Notes (Addendum)
Subjective:  By signing my name below, I, Jennifer Davidson, attest that this documentation has been prepared under the direction and in the presence of Norberto Sorenson, MD Electronically Signed: Charline Bills, ED Scribe 09/24/2017 at 12:50 PM.   Patient ID: Jennifer Davidson, female    DOB: December 05, 1955, 62 y.o.   MRN: [id number]  Chief Complaint  Patient presents with  . Dizziness    X 3-4 [facility], started after starting new medication and is getting worse    HPI Jennifer Davidson is a 62 y.o. female who presents to Primary Care at [facility] complaining of dizziness x 3-4 months.    Past Medical History:  Diagnosis Date  . Acute encephalopathy 06/25/2016  . Anxiety   . Cataract 08/31/2017   left eye  . Cholesterol serum increased   . Chronic back pain    chronic Rt low back pain. s/p L4-5 fusion. failed Rt facet injections. poss due to Rt SI joint dysfunction.  . Chronic kidney disease   . Depression   . Diastolic heart failure (HCC)   . GERD (gastroesophageal reflux disease)   . Hyperparathyroidism (HCC)   . Hypertension   . Hypothyroidism   . Migraine   . Neuromuscular disorder (HCC)   . Sleep apnea   . Syncope 08/30/2015  . Tachycardia   . Trochanteric bursitis of both hips 2012   Confirmed on MRI   Current Outpatient Medications on File Prior to Visit  Medication Sig Dispense Refill  . cholestyramine (QUESTRAN) 4 GM/DOSE powder Take 1 packet (4 g total) by mouth 3 (three) times daily with meals. As needed for diarrhea (Patient not taking: Reported on 09/13/2017) 378 g 2  . diclofenac sodium (VOLTAREN) 1 % GEL Apply 2 g topically daily as needed (for knee pain).    . Difluprednate 0.05 % EMUL One drop in operative eye BID starting after surgery for 3 weeks    . DULoxetine (CYMBALTA) 60 MG capsule Take 1 capsule (60 mg total) by mouth daily. 30 capsule 0  . esomeprazole (NEXIUM) 40 MG capsule TAKE 1 CAPSULE (40 MG TOTAL) BY MOUTH DAILY. 90 capsule 3  . gabapentin (NEURONTIN) 600 MG tablet Take 1  tablet (600 mg total) by mouth 3 (three) times daily. 270 tablet 0  . hydrOXYzine (VISTARIL) 100 MG capsule Take 2 capsules (200 mg total) by mouth at bedtime. sleep 180 capsule 0  . hydrOXYzine (VISTARIL) 50 MG capsule Take 1 capsule (50 mg total) by mouth every 6 (six) hours as needed for anxiety. 120 capsule 1  . levothyroxine (SYNTHROID, LEVOTHROID) 50 MCG tablet Take 1 tablet (50 mcg total) by mouth daily. (Patient taking differently: Take 50 mcg by mouth daily before breakfast. ) 90 tablet 1  . ofloxacin (OCUFLOX) 0.3 % ophthalmic solution 1 DROP TWICE A DAY IN OPERATIVE EYE START 2 DAYS PRIOR TO SURGERY AND CONTINUE 3 WEEKS AFTER SURGERY  1  . ondansetron (ZOFRAN-ODT) 8 MG disintegrating tablet Take 0.5-1 tablets (4-8 mg total) by mouth every 8 (eight) hours as needed for nausea. 20 tablet 2  . prazosin (MINIPRESS) 5 MG capsule Take 2-3 capsules (10-15 mg total) by mouth at bedtime. 270 capsule 0  . propranolol (INDERAL) 20 MG tablet Take 1-2 tablets (20-40 mg total) by mouth at bedtime. 180 tablet 1  . QUEtiapine (SEROQUEL) 200 MG tablet Take 1 tablet (200 mg total) by mouth at bedtime. 90 tablet 0  . rizatriptan (MAXALT-MLT) 10 MG disintegrating tablet Take 1 tablet (10 mg total)  by mouth as needed for migraine. May repeat in 2 hours if needed 30 tablet 0   No current facility-administered medications on file prior to visit.    Past Surgical History:  Procedure Laterality Date  . ABDOMINAL HYSTERECTOMY    . APPENDECTOMY  1986  . BACK SURGERY     L4-5 fusion  . CATARACT EXTRACTION    . KNEE SURGERY     Allergies  Allergen Reactions  . Tramadol Nausea And Vomiting  . Trazodone And Nefazodone Other (See Comments)    Per pt trazodone caused hallucinations and behavior changes    Family History  Problem Relation Age of Onset  . Diabetes Mother   . Heart disease Mother   . Kidney disease Mother   . Thyroid disease Mother   . Heart disease Father   . Seizures Father   . COPD  Father   . Irritable bowel syndrome Father   . Cancer Maternal Grandmother   . Diabetes Sister   . Diabetes Brother   . Colon cancer Neg Hx   . Stomach cancer Neg Hx    Social History   Socioeconomic History  . Marital status: Married    Spouse name: Not on file  . Number of children: 3  . Years of education: 7  . Highest education level: Some college, no degree  Occupational History  . Not on file  Social Needs  . Financial resource strain: Not hard at all  . Food insecurity:    Worry: Never true    Inability: Never true  . Transportation needs:    Medical: No    Non-medical: No  Tobacco Use  . Smoking status: Never Smoker  . Smokeless tobacco: Never Used  Substance and Sexual Activity  . Alcohol use: No  . Drug use: No    Comment: 08-31-2016 PER PT NO   . Sexual activity: Yes    Partners: Male    Comment: married  Lifestyle  . Physical activity:    Days per week: 0 days    Minutes per session: 0 min  . Stress: Very much  Relationships  . Social connections:    Talks on phone: More than three times a week    Gets together: More than three times a week    Attends religious service: Never    Active member of club or organization: No    Attends meetings of clubs or organizations: Never    Relationship status: Married  Other Topics Concern  . Not on file  Social History Narrative  . Not on file   Depression screen [facility] 2/9 09/24/2017 08/27/2017 08/13/2017 07/30/2017 07/16/2017  Decreased Interest 0 1  Down, Depressed, Hopeless 0 1 1 0 0  PHQ - 2 Score 0 1  Altered sleeping - -  Tired, decreased energy - -  Change in appetite - -  Feeling bad or failure about yourself  - -  Trouble concentrating 0 1 3 - -  Moving slowly or fidgety/restless - -  Suicidal thoughts 0 1 0 - -  PHQ-9 Score - -  Difficult doing work/chores Extremely dIfficult - Somewhat difficult - -  Some recent data might be hidden     Review of  Systems  Neurological: Positive for dizziness.      Objective:   Physical Exam  Constitutional: She is oriented to  person, place, and time. She appears well-developed and well-nourished. No distress.  HENT:  Head: Normocephalic and atraumatic.  Eyes: Conjunctivae and EOM are normal.  Neck: Neck supple. No tracheal deviation present.  Cardiovascular: Normal rate.  Pulmonary/Chest: Effort normal. No respiratory distress.  Musculoskeletal: Normal range of motion.  Neurological: She is alert and oriented to person, place, and time.  Skin: Skin is warm and dry.  Psychiatric: She has a normal mood and affect. Her behavior is normal.  Nursing note and vitals reviewed.  BP 118/66   Pulse 80   Temp 97.6 F (36.4 C)   Resp 16   Ht  (1.575 m)   Wt 194 lb 3.2 oz (88.1 kg)   SpO2 97%   BMI 35.52 kg/m     EKG: NSR, no acute ischemic changes noted. No significant change noted when compared to prior EKG done 02/17/2016.  At that time, QT 408, today QT 420   I have personally reviewed the EKG tracing and agree with the computer interpretation:  Sinus  Rhythm  -  Negative precordial T-waves.   WITHIN NORMAL LIMITS  Orthostatic VS: Lying: BP 135/83 HR 76 Sitting: BP 139/77 HR 76 Standing 94/65 HR 78 Assessment & Plan:   1. Dizziness and giddiness - I think she is having orthostatic hypotensive sxs from the prazosin as we keep on having to increase dose.  She is also on the propranolol which might be keeping her HR from responding in increase to push up her BP though is on very low dose but sxs mainly in a.m. Pt agrees to increase water intake and salt in her diet - would rather put up with the orthostatic sxs for now and move more slowly as feels like the prazosin is helping her dreams become less scary and real so less likely to avoid sleep Has joined Y w/ silver sneakers and agrees to start going 2d/wk.  2. Acquired hypothyroidism - thyroid profile great - cont levothyroxine 50   3.  Prediabetes - doing slightly better. Lab Results  Component Value Date   HGBA1C 5.9 (H) 09/24/2017   HGBA1C 6.0 (H) 02/19/2017   HGBA1C 6.1 (H) 08/05/2016    4. Insomnia due to other mental disorder - start melatonin supp ~5mg  and cont seroquel 200 qhs. Stop hydroxyzine 200 qhs for now due to concern for exac GI sxs n/v.  5. Vivid dream   6. Dream anxiety disorder   7. Moderate episode of recurrent major depressive disorder (HCC)   8. Severe recurrent depression with psychosis (HCC)   9. Generalized anxiety disorder - we need to back off on the hydroxyzine  tid and  qhs as suspect it is causing gastroparesis/n/v or at least worsening it. Can't tolerate benzos or higher cymbalta dose.  Start trial of buspar but do expect will need to wean up so ok to double from 5 tid to 10 tid after 1 wk if tolerating. If sxs not improving after titrating buspar up or if can't tolerate, next step would like be to do try off of cymbalta and use other ssri instead.  10. Chronic bilateral low back pain with right-sided sciatica   11. Intractable migraine with status migrainosus, unspecified migraine type - doing better since started propranolol 20-40 qhs though may be worsening o/n and a.m. Dizziness but much better than the migraines or the recurrent worse sleep and hallucination issues from the topamax.  Failed tca prior. Could not get prior auth for aimovig or its relative.  Cont prn maxalt - fioriciet did not work and can't take opiods at all. BP to low to consider increaseing BP med dose or trying other preventative BP med.  12. Nondiabetic gastroparesis -  13. Medication side effect, sequela   14. At risk for side effect of medication - EKG today since increase seroquel prior - QT at top of nml but ok to cont current dose for now and will cont to monitor.    Orders Placed This Encounter  Procedures  . CBC with Differential/Platelet  . Comprehensive metabolic panel  . Hemoglobin A1c  .  TSH+T4F+T3Free  . Orthostatic vital signs  . POCT urinalysis dipstick  . EKG 12-Lead    Meds ordered this encounter  Medications  . prazosin (MINIPRESS) 5 MG capsule    Sig: Take 2 capsules (10 mg total) by mouth at bedtime.    Dispense:  270 capsule    Refill:  0  . busPIRone (BUSPAR) 5 MG tablet    Sig: Take 1 tablet (5 mg total) by mouth 3 (three) times daily.    Dispense:  90 tablet    Refill:  0  . Melatonin 5 MG CAPS    Sig: Take 1 capsule (5 mg total) by mouth at bedtime.    Dispense:  90 capsule    Refill:  0    Over 40 min spent in face-to-face evaluation of and consultation with patient and coordination of care.  Over 50% of this time was spent counseling this patient regarding importance of sleep and exercise in the treatment of anxiety, potential for med interactions or causes of her sxs, importance of trying therapy and investing her effort in that.   Norberto Sorenson, M.D.  Primary Care at [facility] [address] [place], [place] [address] [phone] phone [phone] fax  10/14/17 9:13 AM

## 2017-09-25 LAB — CBC WITH DIFFERENTIAL/PLATELET
BASOS: 0 %
Basophils Absolute: 0 10*3/uL (ref 0.0–0.2)
EOS (ABSOLUTE): 0.2 10*3/uL (ref 0.0–0.4)
EOS: 2 %
Hematocrit: 38.7 % (ref 34.0–46.6)
Hemoglobin: 12.4 g/dL (ref 11.1–15.9)
IMMATURE GRANS (ABS): 0.1 10*3/uL (ref 0.0–0.1)
IMMATURE GRANULOCYTES: 1 %
LYMPHS: 24 %
Lymphocytes Absolute: 2.1 10*3/uL (ref 0.7–3.1)
MCH: 23.6 pg — ABNORMAL LOW (ref 26.6–33.0)
MCHC: 32 g/dL (ref 31.5–35.7)
MCV: 74 fL — ABNORMAL LOW (ref 79–97)
Monocytes Absolute: 0.6 10*3/uL (ref 0.1–0.9)
Monocytes: 6 %
NEUTROS PCT: 67 %
Neutrophils Absolute: 5.9 10*3/uL (ref 1.4–7.0)
PLATELETS: 253 10*3/uL (ref 150–379)
RBC: 5.25 x10E6/uL (ref 3.77–5.28)
RDW: 15.9 % — ABNORMAL HIGH (ref 12.3–15.4)
WBC: 8.8 10*3/uL (ref 3.4–10.8)

## 2017-09-25 LAB — HEMOGLOBIN A1C
ESTIMATED AVERAGE GLUCOSE: 123 mg/dL
Hgb A1c MFr Bld: 5.9 % — ABNORMAL HIGH (ref 4.8–5.6)

## 2017-09-25 LAB — COMPREHENSIVE METABOLIC PANEL
A/G RATIO: 1.8 (ref 1.2–2.2)
ALT: 21 IU/L (ref 0–32)
AST: 20 IU/L (ref 0–40)
Albumin: 4.2 g/dL (ref 3.6–4.8)
Alkaline Phosphatase: 85 IU/L (ref 39–117)
BUN/Creatinine Ratio: 12 (ref 12–28)
BUN: 18 mg/dL (ref 8–27)
Bilirubin Total: 0.4 mg/dL (ref 0.0–1.2)
CALCIUM: 9.9 mg/dL (ref 8.7–10.3)
CO2: 31 mmol/L — AB (ref 20–29)
CREATININE: 1.48 mg/dL — AB (ref 0.57–1.00)
Chloride: 92 mmol/L — ABNORMAL LOW (ref 96–106)
GFR, EST AFRICAN AMERICAN: 43 mL/min/{1.73_m2} — AB (ref >59)
GFR, EST NON AFRICAN AMERICAN: 37 mL/min/{1.73_m2} — AB (ref >59)
Globulin, Total: 2.4 g/dL (ref 1.5–4.5)
Glucose: 115 mg/dL — ABNORMAL HIGH (ref 65–99)
POTASSIUM: 4.8 mmol/L (ref 3.5–5.2)
Sodium: 137 mmol/L (ref 134–144)
TOTAL PROTEIN: 6.6 g/dL (ref 6.0–8.5)

## 2017-09-25 LAB — TSH+T4F+T3FREE
FREE T4: 1.15 ng/dL (ref 0.82–1.77)
T3 FREE: 2.1 pg/mL (ref 2.0–4.4)
TSH: 1.94 u[IU]/mL (ref 0.450–4.500)

## 2017-09-30 ENCOUNTER — Other Ambulatory Visit: Payer: Self-pay | Admitting: Family Medicine

## 2017-09-30 ENCOUNTER — Ambulatory Visit
Admission: RE | Admit: 2017-09-30 | Discharge: 2017-09-30 | Disposition: A | Discharge status: Home / Self Care | Payer: Medicare HMO | Source: Ambulatory Visit | Attending: Family Medicine | Admitting: Family Medicine

## 2017-09-30 DIAGNOSIS — Z0100 Encounter for examination of eyes and vision without abnormal findings: Secondary | ICD-10-CM | POA: Diagnosis not present

## 2017-09-30 DIAGNOSIS — H524 Presbyopia: Secondary | ICD-10-CM | POA: Diagnosis not present

## 2017-09-30 DIAGNOSIS — Z1231 Encounter for screening mammogram for malignant neoplasm of breast: Secondary | ICD-10-CM | POA: Diagnosis not present

## 2017-09-30 NOTE — Telephone Encounter (Signed)
Patient called and verified she uses [facility] for her medications and [facility] for medication she needs quick. She says Dr. [name] has always filled her Seroquel.   Seroquel and Gabapentin refills Last OV:09/06/17 Last refill:Gabapentin 07/16/17 270 tab/0 refill; Seroquel 08/30/17 90 tab/0 refills by another provider ZOX:WRUE Pharmacy: [facility] - [place], [place] - [address] [phone] (Phone) [phone] (Fax)

## 2017-10-04 ENCOUNTER — Other Ambulatory Visit (HOSPITAL_COMMUNITY): Payer: Self-pay | Admitting: Psychiatry

## 2017-10-04 MED ORDER — DULOXETINE HCL 60 MG PO CPEP: 60.0000 mg | ORAL_CAPSULE | Freq: Every day | ORAL | 0 refills | Status: DC

## 2017-10-07 ENCOUNTER — Ambulatory Visit (HOSPITAL_COMMUNITY): Payer: Self-pay | Admitting: Psychiatry

## 2017-10-11 ENCOUNTER — Encounter: Payer: Self-pay | Admitting: Family Medicine

## 2017-10-12 ENCOUNTER — Ambulatory Visit (HOSPITAL_COMMUNITY): Payer: Medicare HMO | Admitting: Licensed Clinical Social Worker

## 2017-10-12 ENCOUNTER — Encounter (HOSPITAL_COMMUNITY): Payer: Self-pay | Admitting: Licensed Clinical Social Worker

## 2017-10-12 DIAGNOSIS — F4310 Post-traumatic stress disorder, unspecified: Secondary | ICD-10-CM

## 2017-10-12 NOTE — Progress Notes (Signed)
   THERAPIST PROGRESS NOTE  Session Time: 10:00 am-10:45 am  Participation Level: Active  Behavioral Response: CasualAlertAnxious  Type of Therapy: Individual Therapy  Treatment Goals addressed: Anxiety  Interventions: CBT and Solution Focused  Summary: Jennifer Davidson is a 62 y.o. female who presents oriented x5 (person, place, situation, time, and object), alert, casually dressed, appropriately groomed, average height, overweight, depressed, and cooperative to address mood and PTSD. Patient has a history of medical treatment including back injury, hypertension, and sleep apnea. Patient has a history of mental health treatment including outpatient therapy, medication management and hospitalization. Patient denies symptoms of mania. She denies suicidal and homicidal ideations. Patient admits to psychosis including auditory and visual hallucinations including seeing people who have passed away, mice and hearing music. Patient denies substance abuse. She is at low risk for lethality.  Physically: Patient was sick for two weeks. She was sick to her stomach. She was told it was anxiety related. She lost about 20 weeks. She got medicine and is feeling better. She is sleeping better.  Spiritually/values: Patient wants to get back to church for herself and her grandchildren. She is having difficulty with driving due to anxiety.   Relationships: Patient reported that her relationship with her husband and brother is going well. She has not heard from her sister in a long time. She has tried to reach out but has not heard from her. She is willing to listen to her and find out what happened but her sister is not returning her phone calls Emotional/Mental/Behavior: Patient reported increased anxiety. After discussion, patient understood that breathing and exercise can help with mood. She also understand that there are irrational thoughts that trigger anxiety and stress. Patient understood that she needs to  identify thoughts and challenge them.   Patient engaged in session. She responded well to interventions. Patient continues to meet criteria for PTSD. She will continue in outpatient therapy due to being the least restrictive service to meet her needs. Patient made minimal progress on her goals.   Suicidal/Homicidal: Negativewithout intent/plan  Therapist Response: Therapist reviewed patient's recent thoughts and behaviors. Therapist utilized CBT to address anxiety. Therapist processed patient's feelings to identify triggers for mood. Therapist discussed steps for managing anxiety including intentional breathing, and identifying and challenging irrational thoughts.   Plan: Return again in 4 weeks.  Diagnosis: Axis I: Post Traumatic Stress Disorder    Axis II: No diagnosis    Bynum Bellows, LCSW 10/12/2017

## 2017-10-14 MED ORDER — QUETIAPINE FUMARATE 200 MG PO TABS: 200.0000 mg | ORAL_TABLET | Freq: Every day | ORAL | 1 refills | Status: DC

## 2017-10-14 MED ORDER — LEVOTHYROXINE SODIUM 50 MCG PO TABS: 50.0000 ug | ORAL_TABLET | Freq: Every day | ORAL | 1 refills | Status: DC

## 2017-10-14 MED ORDER — RIZATRIPTAN BENZOATE 10 MG PO TBDP: 10.0000 mg | ORAL_TABLET | ORAL | 3 refills | Status: DC | PRN

## 2017-10-14 NOTE — Addendum Note (Signed)
Addended by: [name] on: 10/14/2017 01:53 PM   Modules accepted: Level of Service

## 2017-10-16 ENCOUNTER — Other Ambulatory Visit: Payer: Self-pay | Admitting: Family Medicine

## 2017-10-19 ENCOUNTER — Ambulatory Visit (HOSPITAL_COMMUNITY): Payer: Medicare HMO | Admitting: Licensed Clinical Social Worker

## 2017-10-20 DIAGNOSIS — Z9989 Dependence on other enabling machines and devices: Secondary | ICD-10-CM | POA: Diagnosis not present

## 2017-10-20 DIAGNOSIS — E039 Hypothyroidism, unspecified: Secondary | ICD-10-CM | POA: Diagnosis not present

## 2017-10-20 DIAGNOSIS — I129 Hypertensive chronic kidney disease with stage 1 through stage 4 chronic kidney disease, or unspecified chronic kidney disease: Secondary | ICD-10-CM | POA: Diagnosis not present

## 2017-10-20 DIAGNOSIS — I5030 Unspecified diastolic (congestive) heart failure: Secondary | ICD-10-CM | POA: Diagnosis not present

## 2017-10-20 DIAGNOSIS — N183 Chronic kidney disease, stage 3 (moderate): Secondary | ICD-10-CM | POA: Diagnosis not present

## 2017-10-20 DIAGNOSIS — E785 Hyperlipidemia, unspecified: Secondary | ICD-10-CM | POA: Diagnosis not present

## 2017-10-20 DIAGNOSIS — R197 Diarrhea, unspecified: Secondary | ICD-10-CM | POA: Diagnosis not present

## 2017-10-20 DIAGNOSIS — G709 Myoneural disorder, unspecified: Secondary | ICD-10-CM | POA: Diagnosis not present

## 2017-10-22 ENCOUNTER — Encounter: Payer: Self-pay | Admitting: Family Medicine

## 2017-10-22 ENCOUNTER — Ambulatory Visit (INDEPENDENT_AMBULATORY_CARE_PROVIDER_SITE_OTHER): Payer: Medicare HMO | Admitting: Family Medicine

## 2017-10-22 ENCOUNTER — Other Ambulatory Visit: Payer: Self-pay

## 2017-10-22 VITALS — BP 96/66 | HR 81 | Temp 98.5°F | Resp 16 | Ht 62.0 in | Wt 191.8 lb

## 2017-10-22 DIAGNOSIS — N182 Chronic kidney disease, stage 2 (mild): Secondary | ICD-10-CM | POA: Diagnosis not present

## 2017-10-22 DIAGNOSIS — T50905S Adverse effect of unspecified drugs, medicaments and biological substances, sequela: Secondary | ICD-10-CM | POA: Diagnosis not present

## 2017-10-22 DIAGNOSIS — G43911 Migraine, unspecified, intractable, with status migrainosus: Secondary | ICD-10-CM

## 2017-10-22 DIAGNOSIS — F411 Generalized anxiety disorder: Secondary | ICD-10-CM | POA: Diagnosis not present

## 2017-10-22 DIAGNOSIS — F5105 Insomnia due to other mental disorder: Secondary | ICD-10-CM

## 2017-10-22 DIAGNOSIS — S01332A Puncture wound without foreign body of left ear, initial encounter: Secondary | ICD-10-CM | POA: Diagnosis not present

## 2017-10-22 DIAGNOSIS — F339 Major depressive disorder, recurrent, unspecified: Secondary | ICD-10-CM

## 2017-10-22 DIAGNOSIS — F515 Nightmare disorder: Secondary | ICD-10-CM

## 2017-10-22 DIAGNOSIS — R42 Dizziness and giddiness: Secondary | ICD-10-CM | POA: Diagnosis not present

## 2017-10-22 DIAGNOSIS — F99 Mental disorder, not otherwise specified: Secondary | ICD-10-CM

## 2017-10-22 DIAGNOSIS — H6593 Unspecified nonsuppurative otitis media, bilateral: Secondary | ICD-10-CM | POA: Diagnosis not present

## 2017-10-22 DIAGNOSIS — E038 Other specified hypothyroidism: Secondary | ICD-10-CM

## 2017-10-22 MED ORDER — TRIAMCINOLONE ACETONIDE 55 MCG/ACT NA AERO: 2.0000 | INHALATION_SPRAY | Freq: Every day | NASAL | 3 refills | Status: DC

## 2017-10-22 MED ORDER — BUSPIRONE HCL 5 MG PO TABS: 5.0000 mg | ORAL_TABLET | Freq: Three times a day (TID) | ORAL | 1 refills | Status: DC

## 2017-10-22 MED ORDER — CETIRIZINE-PSEUDOEPHEDRINE ER 5-120 MG PO TB12: 1.0000 | ORAL_TABLET | ORAL | 0 refills | Status: DC

## 2017-10-22 MED ORDER — METHYLPREDNISOLONE ACETATE 80 MG/ML IJ SUSP
80.0000 mg | Freq: Once | INTRAMUSCULAR | Status: AC
  Administered 2017-10-22: 80 mg via INTRAMUSCULAR

## 2017-10-22 MED ORDER — BUSPIRONE HCL 5 MG PO TABS: 5.0000 mg | ORAL_TABLET | Freq: Three times a day (TID) | ORAL | 0 refills | Status: DC

## 2017-10-22 NOTE — Patient Instructions (Addendum)
I want you to restart today on the fluticasone nasal spray (FLONASE) - 2 sprays into each nare every night before bed and continue this for the next month. If you do not have the Flonase at home (I last prescribed it for you last October/November time), then will ask that you buy some Nasacort instead (it is over-the-counter but sometimes insurance covers it or at least waives tax so printed out a rx for you to use) - it is also 2 sprays into each nare every night before bed. Remember to point the tip of the bottle to your ear/the side of your head - not straight to the back of your head or towards the midline/septum.   Start the cetirizine-D 5-120mg  every morning.  IF you received an x-ray today, you will receive an invoice from [facility]. Please contact [facility] at [phone] with questions or concerns regarding your invoice.   IF you received labwork today, you will receive an invoice from [facility]. Please contact [facility] at [phone] with questions or concerns regarding your invoice.   Our billing staff will not be able to assist you with questions regarding bills from these companies.  You will be contacted with the lab results as soon as they are available. The fastest way to get your results is to activate your My Chart account. Instructions are located on the last page of this paperwork. If you have not heard from us regarding the results in 2 weeks, please contact this office.     Eustachian Tube Dysfunction The eustachian tube connects the middle ear to the back of the nose. It regulates air pressure in the middle ear by allowing air to move between the ear and nose. It also helps to drain fluid from the middle ear space. When the eustachian tube does not function properly, air pressure, fluid, or both can build up in the middle ear. Eustachian tube dysfunction can affect one or both ears. What are the causes? This condition happens when the eustachian tube  becomes blocked or cannot open normally. This may result from:  Ear infections.  Colds and other upper respiratory infections.  Allergies.  Irritation, such as from cigarette smoke or acid from the stomach coming up into the esophagus (gastroesophageal reflux).  Sudden changes in air pressure, such as from descending in an airplane.  Abnormal growths in the nose or throat, such as nasal polyps, tumors, or enlarged tissue at the back of the throat (adenoids).  What increases the risk? This condition may be more likely to develop in people who smoke and people who are overweight. Eustachian tube dysfunction may also be more likely to develop in children, especially children who have:  Certain birth defects of the mouth, such as cleft palate.  Large tonsils and adenoids.  What are the signs or symptoms? Symptoms of this condition may include:  A feeling of fullness in the ear.  Ear pain.  Clicking or popping noises in the ear.  Ringing in the ear.  Hearing loss.  Loss of balance.  Symptoms may get worse when the air pressure around you changes, such as when you travel to an area of high elevation or fly on an airplane. How is this diagnosed? This condition may be diagnosed based on:  Your symptoms.  A physical exam of your ear, nose, and throat.  Tests, such as those that measure: ? The movement of your eardrum (tympanogram). ? Your hearing (audiometry).  How is this treated? Treatment depends on the  cause and severity of your condition. If your symptoms are mild, you may be able to relieve your symptoms by moving air into ("popping") your ears. If you have symptoms of fluid in your ears, treatment may include:  Decongestants.  Antihistamines.  Nasal sprays or ear drops that contain medicines that reduce swelling (steroids).  In some cases, you may need to have a procedure to drain the fluid in your eardrum (myringotomy). In this procedure, a small tube is placed  in the eardrum to:  Drain the fluid.  Restore the air in the middle ear space.  Follow these instructions at home:  Take over-the-counter and prescription medicines only as told by your health care provider.  Use techniques to help pop your ears as recommended by your health care provider. These may include: ? Chewing gum. ? Yawning. ? Frequent, forceful swallowing. ? Closing your mouth, holding your nose closed, and gently blowing as if you are trying to blow air out of your nose.  Do not do any of the following until your health care provider approves: ? Travel to high altitudes. ? Fly in airplanes. ? Work in a Estate agent or room. ? Scuba dive.  Keep your ears dry. Dry your ears completely after showering or bathing.  Do not smoke.  Keep all follow-up visits as told by your health care provider. This is important. Contact a health care provider if:  Your symptoms do not go away after treatment.  Your symptoms come back after treatment.  You are unable to pop your ears.  You have: ? A fever. ? Pain in your ear. ? Pain in your head or neck. ? Fluid draining from your ear.  Your hearing suddenly changes.  You become very dizzy.  You lose your balance. This information is not intended to replace advice given to you by your health care provider. Make sure you discuss any questions you have with your health care provider. Document Released: 05/30/2015 Document Revised: 10/09/2015 Document Reviewed: 05/22/2014 Elsevier Interactive Patient Education  2018 [employer].  Vertigo Vertigo is the feeling that you or your surroundings are moving when they are not. Vertigo can be dangerous if it occurs while you are doing something that could endanger you or others, such as driving. What are the causes? This condition is caused by a disturbance in the signals that are sent by your body's sensory systems to your brain. Different causes of a disturbance can lead to  vertigo, including:  Infections, especially in the inner ear.  A bad reaction to a drug, or misuse of alcohol and medicines.  Withdrawal from drugs or alcohol.  Quickly changing positions, as when lying down or rolling over in bed.  Migraine headaches.  Decreased blood flow to the brain.  Decreased blood pressure.  Increased pressure in the brain from a head or neck injury, stroke, infection, tumor, or bleeding.  Central nervous system disorders.  What are the signs or symptoms? Symptoms of this condition usually occur when you move your head or your eyes in different directions. Symptoms may start suddenly, and they usually last for less than a minute. Symptoms may include:  Loss of balance and falling.  Feeling like you are spinning or moving.  Feeling like your surroundings are spinning or moving.  Nausea and vomiting.  Blurred vision or double vision.  Difficulty hearing.  Slurred speech.  Dizziness.  Involuntary eye movement (nystagmus).  Symptoms can be mild and cause only slight annoyance, or they can be  severe and interfere with daily life. Episodes of vertigo may return (recur) over time, and they are often triggered by certain movements. Symptoms may improve over time. How is this diagnosed? This condition may be diagnosed based on medical history and the quality of your nystagmus. Your health care provider may test your eye movements by asking you to quickly change positions to trigger the nystagmus. This may be called the Dix-Hallpike test, head thrust test, or roll test. You may be referred to a health care provider who specializes in ear, nose, and throat (ENT) problems (otolaryngologist) or a provider who specializes in disorders of the central nervous system (neurologist). You may have additional testing, including:  A physical exam.  Blood tests.  MRI.  A CT scan.  An electrocardiogram (ECG). This records electrical activity in your heart.  An  electroencephalogram (EEG). This records electrical activity in your brain.  Hearing tests.  How is this treated? Treatment for this condition depends on the cause and the severity of the symptoms. Treatment options include:  Medicines to treat nausea or vertigo. These are usually used for severe cases. Some medicines that are used to treat other conditions may also reduce or eliminate vertigo symptoms. These include: ? Medicines that control allergies (antihistamines). ? Medicines that control seizures (anticonvulsants). ? Medicines that relieve depression (antidepressants). ? Medicines that relieve anxiety (sedatives).  Head movements to adjust your inner ear back to normal. If your vertigo is caused by an ear problem, your health care provider may recommend certain movements to correct the problem.  Surgery. This is rare.  Follow these instructions at home: Safety  Move slowly.Avoid sudden body or head movements.  Avoid driving.  Avoid operating heavy machinery.  Avoid doing any tasks that would cause danger to you or others if you would have a vertigo episode during the task.  If you have trouble walking or keeping your balance, try using a cane for stability. If you feel dizzy or unstable, sit down right away.  Return to your normal activities as told by your health care provider. Ask your health care provider what activities are safe for you. General instructions  Take over-the-counter and prescription medicines only as told by your health care provider.  Avoid certain positions or movements as told by your health care provider.  Drink enough fluid to keep your urine clear or pale yellow.  Keep all follow-up visits as told by your health care provider. This is important. Contact a health care provider if:  Your medicines do not relieve your vertigo or they make it worse.  You have a fever.  Your condition gets worse or you develop new symptoms.  Your family or  friends notice any behavioral changes.  Your nausea or vomiting gets worse.  You have numbness or a "pins and needles" sensation in part of your body. Get help right away if:  You have difficulty moving or speaking.  You are always dizzy.  You faint.  You develop severe headaches.  You have weakness in your hands, arms, or legs.  You have changes in your hearing or vision.  You develop a stiff neck.  You develop sensitivity to light. This information is not intended to replace advice given to you by your health care provider. Make sure you discuss any questions you have with your health care provider. Document Released: 02/10/2005 Document Revised: 10/15/2015 Document Reviewed: 08/26/2014 Elsevier Interactive Patient Education  [employer].

## 2017-10-22 NOTE — Progress Notes (Addendum)
Subjective:  By signing my name below, I, [name], attest that this documentation has been prepared under the direction and in the presence of Norberto SorensonEva Shyane Fossum, MD Electronically Signed: Charline BillsEssence Howell, ED Scribe 10/22/2017 at 11:49 AM.   Patient ID: Jennifer Davidson, female    DOB: 10/22/1955, 62 y.o.   MRN: [id number]  Chief Complaint  Patient presents with  . Dizziness    follow up and medication review   HPI Jennifer Davidson is a 62 y.o. female who [facility] for f/u on dizziness. Pt states that dizziness is unchanged. Jennifer Davidson notices dizziness even with sitting at rest but occasionally worsened with certain movements, turning over in bed and even with standing still. Denies lightheadedness, recent motion sickness. Jennifer Davidson has taken prednisone and sudafed in the past without side-effects.  Migraine HAs Using CPAP nightly. Jennifer Davidson has only had 1 migraine since las visit.   Polypharmacy AM takes:  Levothyroxine 50cmg  Gabapentin 600mg   Duloxetine 60mg   Esomeprazole 40mg   Famotidine 40mg   Buspirone 5mg   Noon takes:  Gabapentin 600mg   Buspirone 5mg   Evening takes:  Hydroxyzine 50mg   Buspirone 5mg   Night takes:  Gabapentin 600mg   Quetiapine 200mg   Famotidine 40mg   Propranolol 20-40mg  (currently usually taking 2 20mg  as seemed to prevent migraines better)  Hydroxyzine 150mg   Prazosin 9mg   Melatonin 5mg   AS NEEDED TO TREAT INSOMNIA IF AWAKENS OVERNIGHT:  Prazosin 2-4mg  (=1-2 tabs)  Quetiapine 100mg  (=1/2 tab)  AS NEEDED FOR MIGRAINE HEADACHES  Propranolol 20-40mg  (=1-2 tabs )     Past Medical History:  Diagnosis Date  . Acute encephalopathy 06/25/2016  . Anxiety   . Cataract 08/31/2017   left eye  . Cholesterol serum increased   . Chronic back pain    chronic Rt low back pain. s/p L4-5 fusion. failed Rt facet injections. poss due to Rt SI joint dysfunction.  . Chronic kidney disease   . Depression   . Diastolic heart failure (HCC)   . GERD  (gastroesophageal reflux disease)   . Hyperparathyroidism (HCC)   . Hypertension   . Hypothyroidism   . Migraine   . Neuromuscular disorder (HCC)   . Sleep apnea   . Syncope 08/30/2015  . Tachycardia   . Trochanteric bursitis of both hips 2012   Confirmed on MRI   Current Outpatient Medications on File Prior to Visit  Medication Sig Dispense Refill  . busPIRone (BUSPAR) 5 MG tablet Take 1 tablet (5 mg total) by mouth 3 (three) times daily. 90 tablet 0  . DULoxetine (CYMBALTA) 60 MG capsule Take 1 capsule (60 mg total) by mouth daily. 30 capsule 0  . esomeprazole (NEXIUM) 40 MG capsule TAKE 1 CAPSULE (40 MG TOTAL) BY MOUTH DAILY. 90 capsule 3  . gabapentin (NEURONTIN) 600 MG tablet TAKE 1 TABLET THREE TIMES DAILY 270 tablet 1  . hydrOXYzine (VISTARIL) 100 MG capsule Take 100 mg by mouth 3 (three) times daily as needed for itching.    . hydrOXYzine (VISTARIL) 50 MG capsule Take 50 mg by mouth 3 (three) times daily as needed.    Marland Kitchen. levothyroxine (SYNTHROID, LEVOTHROID) 50 MCG tablet Take 1 tablet (50 mcg total) by mouth daily before breakfast. 90 tablet 1  . Melatonin 5 MG CAPS Take 1 capsule (5 mg total) by mouth at bedtime. 90 capsule 0  . ondansetron (ZOFRAN-ODT) 8 MG disintegrating tablet Take 0.5-1 tablets (4-8 mg total) by mouth every 8 (eight) hours as needed for nausea. 20 tablet 2  .  prazosin (MINIPRESS) 5 MG capsule Take 2 capsules (10 mg total) by mouth at bedtime. 270 capsule 0  . propranolol (INDERAL) 20 MG tablet Take 1-2 tablets (20-40 mg total) by mouth at bedtime. 180 tablet 1  . QUEtiapine (SEROQUEL) 200 MG tablet Take 1 tablet (200 mg total) by mouth at bedtime. 90 tablet 1  . rizatriptan (MAXALT-MLT) 10 MG disintegrating tablet Take 1 tablet (10 mg total) by mouth as needed for migraine. May repeat in 2 hours if needed 30 tablet 3  . diclofenac sodium (VOLTAREN) 1 % GEL Apply 2 g topically daily as needed (for knee pain).    . Difluprednate 0.05 % EMUL One drop in operative  eye BID starting after surgery for 3 weeks     No current facility-administered medications on file prior to visit.    Past Surgical History:  Procedure Laterality Date  . ABDOMINAL HYSTERECTOMY    . APPENDECTOMY  1986  . BACK SURGERY     L4-5 fusion  . CATARACT EXTRACTION    . KNEE SURGERY     Allergies  Allergen Reactions  . Tramadol Nausea And Vomiting  . Trazodone And Nefazodone Other (See Comments)    Per pt trazodone caused hallucinations and behavior changes    Family History  Problem Relation Age of Onset  . Diabetes Mother   . Heart disease Mother   . Kidney disease Mother   . Thyroid disease Mother   . Heart disease Father   . Seizures Father   . COPD Father   . Irritable bowel syndrome Father   . Cancer Maternal Grandmother   . Diabetes Sister   . Diabetes Brother   . Colon cancer Neg Hx   . Stomach cancer Neg Hx    Social History   Socioeconomic History  . Marital status: Married    Spouse name: Not on file  . Number of children: 3  . Years of education: 30  . Highest education level: Some college, no degree  Occupational History  . Not on file  Social Needs  . Financial resource strain: Not hard at all  . Food insecurity:    Worry: Never true    Inability: Never true  . Transportation needs:    Medical: No    Non-medical: No  Tobacco Use  . Smoking status: Never Smoker  . Smokeless tobacco: Never Used  Substance and Sexual Activity  . Alcohol use: No  . Drug use: No    Comment: 08-31-2016 PER PT NO   . Sexual activity: Yes    Partners: Male    Comment: married  Lifestyle  . Physical activity:    Days per week: 0 days    Minutes per session: 0 min  . Stress: Very much  Relationships  . Social connections:    Talks on phone: More than three times a week    Gets together: More than three times a week    Attends religious service: Never    Active member of club or organization: No    Attends meetings of clubs or organizations: Never      Relationship status: Married  Other Topics Concern  . Not on file  Social History Narrative  . Not on file   Depression screen [facility] 2/9 10/22/2017 09/24/2017 08/27/2017 08/13/2017 07/30/2017  Decreased Interest 1 1 1 1  0  Down, Depressed, Hopeless 2 0 1 1 0  PHQ - 2 Score 3 1 2 2  0  Altered sleeping 2 3 1  3 -  Tired, decreased energy 3 3 1 3  -  Change in appetite 3 3 1 2  -  Feeling bad or failure about yourself  2 1 1 1  -  Trouble concentrating 2 0 1 3 -  Moving slowly or fidgety/restless 3 3 1 3  -  Suicidal thoughts 0 0 1 0 -  PHQ-9 Score 18 14 9 17  -  Difficult doing work/chores - Extremely dIfficult - Somewhat difficult -  Some recent data might be hidden     Review of Systems  Gastrointestinal: Negative for vomiting (resolved).  Neurological: Positive for dizziness (unchanged) and headaches (intermittent). Negative for light-headedness.      Objective:   Physical Exam  Constitutional: Jennifer Davidson is oriented to person, place, and time. Jennifer Davidson appears well-developed and well-nourished. No distress.  HENT:  Head: Normocephalic and atraumatic.  Right Ear: A middle ear effusion (small) is present.  Left Ear: A middle ear effusion (small) is present.  Nose: Nose normal.  Mouth/Throat: Oropharynx is clear and moist and mucous membranes are normal.  Eyes: Conjunctivae and EOM are normal. Right eye exhibits no nystagmus. Left eye exhibits no nystagmus.  No nystagmus. Neg [employer]. Pos Vertigo when motion was completed.  Neck: Neck supple. No tracheal deviation present.  Cardiovascular: Normal rate, regular rhythm and normal heart sounds.  Pulmonary/Chest: Effort normal and breath sounds normal. No respiratory distress.  Musculoskeletal: Normal range of motion.  Neurological: Jennifer Davidson is alert and oriented to person, place, and time.  Skin: Skin is warm and dry.  Psychiatric: Jennifer Davidson has a normal mood and affect. Her behavior is normal.  Nursing note and vitals reviewed.  BP 96/66 (BP  Location: Left Arm)   Pulse 81   Temp 98.5 F (36.9 C) (Oral)   Resp 16   Ht 5\' 2"  (1.575 m)   Wt 191 lb 12.8 oz (87 kg)   SpO2 97%   BMI 35.08 kg/m     Orthostatic VS Lying 135/83 P76 Sitting 139/77 P 76 Standing 94/65 P 78 Assessment & Plan:   1. Dizziness and giddiness - suspect orthostatic from prazosine and propranolol - worse in am. - but pt willing to put up with it from improvement in dreams and HAs Jennifer Davidson has experienced - knows to move slower and hold on to things when changing position  2. Medication side effect, sequela   3. Vertigo - suspect from ETD - Depo-Medro IM today and start zyrtec-D 5-120 qam (no prob tolerating pseudoephedrine proir) and then restart flonase OR nasacort qhs.  4. Middle ear effusion, bilateral   5. Intractable migraine with status migrainosus, unspecified migraine type   6. Complication of left ear piercing, initial encounter - looks irritated around new piercing in tragus meant to reduce migraines - no sign of fluctuance, drainage, heat, or the beat red erythema of infection so hopefully can still be treated with wet warm compresses, top abx ointment, and changing stud - call if worsening while on vacation so I can call in some doxy  7. Dream anxiety disorder   8. Stage 2 chronic kidney disease   9. Generalized anxiety disorder - much better on buspar and cutting back on hydroxyzine has sig helped GI sxs though still w/ occ episodes of nausea - cont buspar 5 tid - completely out so needs rx to local pharm as well as mail order.  10. Major depression, recurrent, chronic (HCC)   11. Insomnia due to other mental disorder     Saw [place] alone  today - husband [name] remained in waiting room I presume - hopefully a marker of both of their confidence in her improvement.  Really seems to be doing better and feels like current med regimen is in a good place - does not want to make any changes.  Jennifer Davidson is going to visit her brother and taking her granddaughter -  hasn't been in a huge # of years - should be a relaxing lake/outdoor quiet vaca.  Nausea/vomiting - suspect anticholinergic from to much hydroxyzine -cut down as much as possible.  Orders Placed This Encounter  Procedures  . Orthostatic vital signs    Meds ordered this encounter  Medications  . methylPREDNISolone acetate (DEPO-MEDROL) injection 80 mg  . cetirizine-pseudoephedrine (ZYRTEC-D) 5-120 MG tablet    Sig: Take 1 tablet by mouth every morning.    Dispense:  30 tablet    Refill:  0  . DISCONTD: busPIRone (BUSPAR) 5 MG tablet    Sig: Take 1 tablet (5 mg total) by mouth 3 (three) times daily.    Dispense:  90 tablet    Refill:  0  . busPIRone (BUSPAR) 5 MG tablet    Sig: Take 1 tablet (5 mg total) by mouth 3 (three) times daily.    Dispense:  270 tablet    Refill:  1  . triamcinolone (NASACORT) 55 MCG/ACT AERO nasal inhaler    Sig: Place 2 sprays into the nose daily.    Dispense:  1 Inhaler    Refill:  3    I personally performed the services described in this documentation, which was scribed in my presence. The recorded information has been reviewed and considered, and addended by me as needed.   Norberto Sorenson, M.D.  Primary Care at [facility] [address] [place], [place] [address] [phone] phone [phone] fax  10/30/17 1:39 AM

## 2017-10-26 ENCOUNTER — Ambulatory Visit (HOSPITAL_COMMUNITY): Payer: Medicare HMO | Admitting: Licensed Clinical Social Worker

## 2017-10-30 MED ORDER — HYDROXYZINE PAMOATE 100 MG PO CAPS: 100.0000 mg | ORAL_CAPSULE | Freq: Every day | ORAL | 0 refills | Status: DC

## 2017-10-30 MED ORDER — HYDROXYZINE PAMOATE 50 MG PO CAPS: 50.0000 mg | ORAL_CAPSULE | Freq: Three times a day (TID) | ORAL | 0 refills | Status: DC | PRN

## 2017-10-30 NOTE — Addendum Note (Signed)
Addended by: Sherren MochaSHAW, Dawnya Grams N on: 10/30/2017 01:40 AM   Modules accepted: Orders

## 2017-11-03 ENCOUNTER — Ambulatory Visit (HOSPITAL_COMMUNITY): Payer: Medicare HMO | Admitting: Licensed Clinical Social Worker

## 2017-11-04 ENCOUNTER — Telehealth: Payer: Self-pay | Admitting: Family Medicine

## 2017-11-04 NOTE — Telephone Encounter (Signed)
Copied from CRM [id number]. Topic: Quick Communication - Rx Refill/Question >> Nov 04, 2017 10:10 AM Crist InfanteHarrald, Kathy J wrote: Medication: DULoxetine (CYMBALTA) 60 MG capsule [facility] calling for the pt to request a refill of this med. Pt wants to get this at mail order  90 days [facility] [place] - [id number] [phone] (Phone) [phone] (Fax)  Got at local pharmacy last time

## 2017-11-04 NOTE — Telephone Encounter (Signed)
Message re: refill Cymbalta to Dr. Clelia CroftShaw Next appt 11/28/2017 Last refill 10/04/2017  #30

## 2017-11-05 MED ORDER — DULOXETINE HCL 60 MG PO CPEP: 60.0000 mg | ORAL_CAPSULE | Freq: Every day | ORAL | 3 refills | Status: DC

## 2017-11-05 NOTE — Telephone Encounter (Signed)
Sent in [facility]alta to [facility]

## 2017-11-07 ENCOUNTER — Other Ambulatory Visit: Payer: Self-pay | Admitting: Family Medicine

## 2017-11-08 NOTE — Telephone Encounter (Signed)
Refill request Flonase  LOV 10/22/2017 Dr Clelia CroftShaw  Rx for Nasocort written at that time  Flonase not on active med list    Pharmacy on file

## 2017-11-16 NOTE — Addendum Note (Signed)
Addended by: Sherren MochaSHAW, Ziyon Cedotal N on: 11/16/2017 01:54 AM   Modules accepted: Orders

## 2017-11-18 ENCOUNTER — Ambulatory Visit: Payer: Medicare HMO | Admitting: Family Medicine

## 2017-11-21 DIAGNOSIS — M1711 Unilateral primary osteoarthritis, right knee: Secondary | ICD-10-CM | POA: Diagnosis not present

## 2017-11-25 ENCOUNTER — Other Ambulatory Visit: Payer: Self-pay | Admitting: Family Medicine

## 2017-11-26 ENCOUNTER — Telehealth: Payer: Self-pay | Admitting: Family Medicine

## 2017-11-28 ENCOUNTER — Ambulatory Visit: Payer: Medicare HMO | Admitting: Family Medicine

## 2017-11-28 NOTE — Telephone Encounter (Signed)
Quetiapine refill Last Refill:10/14/17 # 90 1 RF Last OV: 10/22/17 PCP: Dr Clelia Croftshaw Pharmacy:[facility]  Prazosin refill Last Refill:09/24/17 # 270 Last OV: 10/22/17

## 2017-11-29 ENCOUNTER — Other Ambulatory Visit: Payer: Self-pay | Admitting: Family Medicine

## 2017-11-30 NOTE — Telephone Encounter (Signed)
esomeprazole refill Last Refill:12/09/16 # 90 3 RF Last OV: 10/22/17 PCP: Dr Clelia CroftShaw Pharmacy:[facility]  furosemide refill Last Refill:stop date 07/16/17  Last OV: 07/02/17

## 2017-12-01 NOTE — Telephone Encounter (Signed)
Patient is calling and states this has not been sent in yet and would like for it to be done as soon as possible.

## 2017-12-01 NOTE — Telephone Encounter (Signed)
Patient is calling and states this has not been sent in yet and would like for it to be done as soon as possible.  

## 2017-12-05 NOTE — Telephone Encounter (Signed)
Please advise on refills.  

## 2017-12-07 ENCOUNTER — Other Ambulatory Visit: Payer: Self-pay | Admitting: Family Medicine

## 2017-12-07 NOTE — Telephone Encounter (Signed)
Copied from CRM [id number]. Topic: Quick Communication - Rx Refill/Question >> Dec 07, 2017  2:30 PM Beryle Beamsawoud, Joyce CopaJessica L wrote: Medication:QUEtiapine (SEROQUEL) 200 MG tablet prazosin (MINIPRESS) 5 MG capsule   Has the patient contacted their pharmacy? Yes   Preferred Pharmacy (with phone number or street name): [facility] [place], [place] - [id number] [phone] (Phone) [phone] (Fax)   Agent: Please be advised that RX refills may take up to 3 business days. We ask that you follow-up with your pharmacy.

## 2017-12-08 NOTE — Telephone Encounter (Signed)
Patient is requesting refill on Seroquel 200 mg and Minipress 5 mg. Please advise, thank you.

## 2017-12-08 NOTE — Telephone Encounter (Signed)
Prazosin and Seroquel refill Last OV:10/22/17 Last refill:Prazosin 09/24/17 270 cap/0 refill; Seroquel 10/14/17 90 tab/1 refill FAO:ZHYQPCP:Shaw Pharmacy: [facility] [place], [place] - [id number] [phone] (Phone) [phone] (Fax)

## 2017-12-11 NOTE — Telephone Encounter (Signed)
[facility] mo [facility] seroquel and prazosin.

## 2017-12-11 NOTE — Telephone Encounter (Signed)
[facility]

## 2017-12-12 NOTE — Telephone Encounter (Signed)
Please advise. Dgaddy, CMA 

## 2017-12-21 ENCOUNTER — Ambulatory Visit: Payer: Self-pay | Admitting: *Deleted

## 2017-12-21 NOTE — Telephone Encounter (Addendum)
Pt called with complaints of dizziness for 1 week; she says that she felt like she was going to pass out earlier this morning; says laying down is the only thing that morning;  the pt has been having 5-6 episodes of watery diarrhea since 12/18/17; she says that she is able to tolerate liquids with nor nausea or vomiting; the pt says that she is not able to check her blood pressure because she can not find her cuff; recommendations made per nurse triage protocol to include going to ED; the pt would like to be seen in the office; explained to pt that due to her symptoms she needs to go to ED now; she verbalizes understanding and states that her husband will take her; will route to office for notification of this encounter.  Reason for Disposition . [1] MODERATE dizziness (e.g., interferes with normal activities) AND [2] has NOT been evaluated by physician for this  (Exception: dizziness caused by heat exposure, sudden standing, or poor fluid intake)  Answer Assessment - Initial Assessment Questions 1. DESCRIPTION: "Describe your dizziness."     lightheaded 2. LIGHTHEADED: "Do you feel lightheaded?" (e.g., somewhat faint, woozy, weak upon standing)     Weak upon standing 3. VERTIGO: "Do you feel like either you or the room is spinning or tilting?" (i.e. vertigo)     no 4. SEVERITY: "How bad is it?"  "Do you feel like you are going to faint?" "Can you stand and walk?"   - MILD - walking normally   - MODERATE - interferes with normal activities (e.g., work, school)    - SEVERE - unable to stand, requires support to walk, feels like passing out now.      Moderate to severe 5. ONSET:  "When did the dizziness begin?"     12/14/17 6. AGGRAVATING FACTORS: "Does anything make it worse?" (e.g., standing, change in head position)     Standing and changing position of her head  7. HEART RATE: "Can you tell me your heart rate?" "How many beats in 15 seconds?"  (Note: not all patients can do this)       Pt unable  to complete task 8. CAUSE: "What do you think is causing the dizziness?"     unsure 9. RECURRENT SYMPTOM: "Have you had dizziness before?" If so, ask: "When was the last time?" "What happened that time?"     Yes; when had ear problems back in June 2019 10. OTHER SYMPTOMS: "Do you have any other symptoms?" (e.g., fever, chest pain, vomiting, diarrhea, bleeding)       Diarrhea since 12/18/17 (describes as watery; 5-6 times per day) 11. PREGNANCY: "Is there any chance you are pregnant?" "When was your last menstrual period?"       no  Protocols used: DIZZINESS [facility]- LIGHTHEADEDNESS-A-AH

## 2017-12-22 ENCOUNTER — Other Ambulatory Visit: Payer: Self-pay

## 2017-12-22 ENCOUNTER — Encounter: Payer: Self-pay | Admitting: Physician Assistant

## 2017-12-22 ENCOUNTER — Ambulatory Visit (INDEPENDENT_AMBULATORY_CARE_PROVIDER_SITE_OTHER): Payer: Medicare HMO | Admitting: Physician Assistant

## 2017-12-22 VITALS — BP 120/72 | HR 91 | Temp 98.5°F | Resp 20 | Ht 62.21 in | Wt 194.6 lb

## 2017-12-22 DIAGNOSIS — R42 Dizziness and giddiness: Secondary | ICD-10-CM | POA: Diagnosis not present

## 2017-12-22 MED ORDER — PRAZOSIN HCL 2 MG PO CAPS: ORAL_CAPSULE | ORAL | 0 refills | Status: DC

## 2017-12-22 MED ORDER — MECLIZINE HCL 25 MG PO TABS: 25.0000 mg | ORAL_TABLET | Freq: Three times a day (TID) | ORAL | 0 refills | Status: DC | PRN

## 2017-12-22 NOTE — Progress Notes (Signed)
CANDI PROFIT  MRN: [id number] DOB: May 04, 1956  PCP: Shawnee Knapp, MD  Chief Complaint  Patient presents with  . Dizziness    X 3 days  . Depression    screening done  . Anxiety    screening done    Subjective:  Pt presents to clinic for dizziness that she has had intermittently for the last couple of months but then 4 days ago it got worse.  She has been having problems with vertigo, the room is spinning around her in a counter clock wise fashion.  4 days ago this became consistent for patient.  It started after an argument with her brother and sister-in-law.  [place] after the argument.  The vertigo has not changed over the last 4 days.  She is slightly nauseated with it and has had some mild diarrhea.  No other cold or GI symptoms.  She is not having ear ringing or hearing loss.  She has no headaches.  She has no other neurological symptoms.  Her symptoms do not worsen with head movement.  They are even affecting her while laying down.  She has had no change in her eating habits.  She did miss her allergy medicines 4 days prior to the start of this and she has been back on them for the last 2 days.  She is on prazosin at bedtime the first night of her symptoms her husband gave her a higher dose than normal, she typically takes 9 mg (she is having trouble getting this dose because she only has had milligrams capsules currently) but husband gave her 15 mg that night.  He is only given her that dose once.  Her anxiety was the trigger for the vertigo but since being home her anxiety has significantly improved but the vertigo has not gotten better.  Allergy relief was missed before this started but she has been back on it or 2 days    History is obtained by patient.  Review of Systems  HENT: Negative.   Neurological: Positive for dizziness. Negative for weakness and numbness.  Psychiatric/Behavioral: Positive for dysphoric mood. The patient is  nervous/anxious.     Patient Active Problem List   Diagnosis Date Noted  . Hallucination, visual 09/14/2017  . Prediabetes 09/14/2017  . Insomnia due to other mental disorder 09/14/2017  . Dream anxiety disorder 09/14/2017  . Vivid dream 09/14/2017  . Altered mental status 06/23/2017  . Irritable bowel syndrome with diarrhea 04/25/2017  . Sacroiliac joint dysfunction of right side 03/17/2017  . 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  . Seizure-like activity ([place]) 09/14/2016  . Moderate episode of recurrent major depressive disorder ([place]) 08/31/2016  . PTSD (post-traumatic stress disorder) 08/31/2016  . Recurrent UTI 06/25/2016  . Severe recurrent depression with psychosis ([place]) 06/25/2016  . GERD (gastroesophageal reflux disease) 11/13/2015  . Generalized anxiety disorder 06/14/2015  . Chronic pain of right lower extremity 05/14/2015  . Neuropathy of lower extremity 05/14/2015  . Headache, migraine 01/17/2015  . Severe recurrent major depressive disorder with psychotic features ([place])   . Major depression, recurrent, chronic ([place]) 12/02/2014  . Mood disorder ([place]) 12/01/2014  . Chronic back pain   . Hyperparathyroidism, secondary renal ([place]) 07/31/2014  . Neuromuscular disorder ([place]) 07/31/2014  . Lower back pain 09/21/2013  . Diastolic heart failure ([place]) 03/17/2012  . Syncope and collapse 02/12/2012  . Chronic kidney disease 02/04/2012  . Edema 02/04/2012  .  Tachycardia 02/04/2012  . Encounter for long-term (current) use of medications 02/04/2012  . Anxiety and depression 02/04/2012  . Hypothyroid 02/04/2012  . Hyperlipidemia 02/04/2012    Current Outpatient Medications on File Prior to Visit  Medication Sig Dispense Refill  . busPIRone (BUSPAR) 5 MG tablet Take 1 tablet (5 mg total) by mouth 3 (three) times daily. 270 tablet 1  . [facility] ALLERGY RELIEF-D 5-120 MG tablet TAKE 1 TABLET BY MOUTH EVERY DAY IN THE MORNING 24 tablet 0  . diclofenac sodium  (VOLTAREN) 1 % GEL Apply 2 g topically daily as needed (for knee pain).    . DULoxetine (CYMBALTA) 60 MG capsule Take 1 capsule (60 mg total) by mouth daily. 90 capsule 3  . esomeprazole (NEXIUM) 40 MG capsule TAKE 1 CAPSULE EVERY DAY 90 capsule 3  . fluticasone (FLONASE) 50 MCG/ACT nasal spray USE 2 SPRAYS IN EACH NOSTRIL AT BEDTIME 16 g 0  . gabapentin (NEURONTIN) 600 MG tablet TAKE 1 TABLET THREE TIMES DAILY 270 tablet 1  . hydrOXYzine (VISTARIL) 100 MG capsule Take 1-2 capsules (100-200 mg total) by mouth at bedtime. 180 capsule 0  . hydrOXYzine (VISTARIL) 50 MG capsule Take 1 capsule (50 mg total) by mouth 3 (three) times daily as needed for anxiety. 270 capsule 0  . levothyroxine (SYNTHROID, LEVOTHROID) 50 MCG tablet Take 1 tablet (50 mcg total) by mouth daily before breakfast. 90 tablet 1  . Melatonin 5 MG CAPS Take 1 capsule (5 mg total) by mouth at bedtime. 90 capsule 0  . ondansetron (ZOFRAN-ODT) 8 MG disintegrating tablet Take 0.5-1 tablets (4-8 mg total) by mouth every 8 (eight) hours as needed for nausea. 20 tablet 2  . prazosin (MINIPRESS) 5 MG capsule TAKE 2 TO 3 CAPSULES AT BEDTIME 270 capsule 1  . propranolol (INDERAL) 20 MG tablet Take 1-2 tablets (20-40 mg total) by mouth at bedtime. 180 tablet 1  . QUEtiapine (SEROQUEL) 200 MG tablet Take 1 tablet (200 mg total) by mouth at bedtime. 90 tablet 1  . QUEtiapine (SEROQUEL) 50 MG tablet TAKE 2 TABLETS AT BEDTIME 180 tablet 1  . rizatriptan (MAXALT-MLT) 10 MG disintegrating tablet Take 1 tablet (10 mg total) by mouth as needed for migraine. May repeat in 2 hours if needed 30 tablet 3  . triamcinolone (NASACORT) 55 MCG/ACT AERO nasal inhaler Place 2 sprays into the nose daily. 1 Inhaler 3  . furosemide (LASIX) 40 MG tablet TAKE 1 TABLET EVERY DAY (Patient not taking: Reported on 12/22/2017) 90 tablet 1   No current facility-administered medications on file prior to visit.     Allergies  Allergen Reactions  . Tramadol Nausea And  Vomiting  . Trazodone And Nefazodone Other (See Comments)    Per pt trazodone caused hallucinations and behavior changes     Past Medical History:  Diagnosis Date  . Acute encephalopathy 06/25/2016  . Anxiety   . Cataract 08/31/2017   left eye  . Cholesterol serum increased   . Chronic back pain    chronic Rt low back pain. s/p L4-5 fusion. failed Rt facet injections. poss due to Rt SI joint dysfunction.  . Chronic kidney disease   . Depression   . Diastolic heart failure ([place])   . GERD (gastroesophageal reflux disease)   . Hyperparathyroidism ([place])   . Hypertension   . Hypothyroidism   . Migraine   . Neuromuscular disorder ([place])   . Sleep apnea   . Syncope 08/30/2015  . Tachycardia   . Trochanteric bursitis  of both hips 2012   Confirmed on MRI   Social History   Social History Narrative  . Not on file   Social History   Tobacco Use  . Smoking status: Never Smoker  . Smokeless tobacco: Never Used  Substance Use Topics  . Alcohol use: No  . Drug use: No    Comment: 08-31-2016 PER PT NO    family history includes COPD in her father; Cancer in her maternal grandmother; Diabetes in her brother, mother, and sister; Heart disease in her father and mother; Irritable bowel syndrome in her father; Kidney disease in her mother; Seizures in her father; Thyroid disease in her mother.     Objective:  BP 120/72 (BP Location: Left Arm, Patient Position: Sitting, Cuff Size: Large)   Pulse 91   Temp 98.5 F (36.9 C) (Oral)   Resp 20   Ht 5' 2.21" (1.58 m)   Wt 194 lb 9.6 oz (88.3 kg)   SpO2 95%   BMI 35.36 kg/m  Body mass index is 35.36 kg/m.  Wt Readings from Last 3 Encounters:  12/22/17 194 lb 9.6 oz (88.3 kg)  10/22/17 191 lb 12.8 oz (87 kg)  09/24/17 194 lb 3.2 oz (88.1 kg)    Physical Exam  Constitutional: She is oriented to person, place, and time. She appears well-developed and well-nourished.  HENT:  Head: Normocephalic and atraumatic.  Right Ear: Hearing  and external ear normal.  Left Ear: Hearing and external ear normal.  Eyes: Pupils are equal, round, and reactive to light. Conjunctivae, EOM and lids are normal. Right eye exhibits no nystagmus. Left eye exhibits no nystagmus.  With eye movement to the left patient experiences worsening vertigo, with head movement the vertigo does not change.  Neck: Normal range of motion.  Cardiovascular: Normal rate, regular rhythm and normal heart sounds.  No murmur heard. Pulmonary/Chest: Effort normal and breath sounds normal. She has no wheezes.  Neurological: She is alert and oriented to person, place, and time. She has normal strength and normal reflexes. No sensory deficit.  Skin: Skin is warm and dry.  Psychiatric: Judgment normal.  Mild anxiety in room with patient constantly touching and physically with her clothing.  Vitals reviewed.   Assessment and Plan :  Vertigo - Plan: CMP14+EGFR, meclizine (ANTIVERT) 25 MG tablet -unsure of cause of patient's vertigo.  Could have been triggered by anxiety attack, reduction of allergy medicine or prazosin higher dose during increased anxiety.  All of these have improved but her vertigo continues.  Will check labs and start Antivert for symptoms.  She will take this medicine for 2 days scheduled and then after that as needed.  She was encouraged to stay well-hydrated.  Patient verbalized to me that they understand the following: diagnosis, what is being done for them, what to expect and what should be done at home.  Their questions have been answered.  See after visit summary for patient specific instructions.  Windell Hummingbird [name]  [facility] 12/22/2017 11:57 AM  Please note: Portions of this report may have been transcribed using dragon voice recognition software. Every effort was made to ensure accuracy; however, inadvertent computerized transcription errors may be present.

## 2017-12-22 NOTE — Patient Instructions (Addendum)
     IF you received an x-ray today, you will receive an invoice from [facility]. Please contact [facility] at [phone] with questions or concerns regarding your invoice.   IF you received labwork today, you will receive an invoice from [facility]. Please contact [facility] at [phone] with questions or concerns regarding your invoice.   Our billing staff will not be able to assist you with questions regarding bills from these companies.  You will be contacted with the lab results as soon as they are available. The fastest way to get your results is to activate your My Chart account. Instructions are located on the last page of this paperwork. If you have not heard from us regarding the results in 2 weeks, please contact this office.     Vertigo Vertigo means that you feel like you are moving when you are not. Vertigo can also make you feel like things around you are moving when they are not. This feeling can come and go at any time. Vertigo often goes away on its own. Follow these instructions at home:  Avoid making fast movements.  Avoid driving.  Avoid using heavy machinery.  Avoid doing any task or activity that might cause danger to you or other people if you would have a vertigo attack while you are doing it.  Sit down right away if you feel dizzy or have trouble with your balance.  Take over-the-counter and prescription medicines only as told by your doctor.  Follow instructions from your doctor about which positions or movements you should avoid.  Drink enough fluid to keep your pee (urine) clear or pale yellow.  Keep all follow-up visits as told by your doctor. This is important. Contact a doctor if:  Medicine does not help your vertigo.  You have a fever.  Your problems get worse or you have new symptoms.  Your family or friends see changes in your behavior.  You feel sick to your stomach (nauseous) or you throw up (vomit).  You have a  "pins and needles" feeling or you are numb in part of your body. Get help right away if:  You have trouble moving or talking.  You are always dizzy.  You pass out (faint).  You get very bad headaches.  You feel weak or have trouble using your hands, arms, or legs.  You have changes in your hearing.  You have changes in your seeing (vision).  You get a stiff neck.  Bright light starts to bother you. This information is not intended to replace advice given to you by your health care provider. Make sure you discuss any questions you have with your health care provider. Document Released: 02/10/2008 Document Revised: 10/09/2015 Document Reviewed: 08/26/2014 Elsevier Interactive Patient Education  2018 Elsevier Inc.  

## 2017-12-23 ENCOUNTER — Telehealth: Payer: Self-pay | Admitting: Family Medicine

## 2017-12-23 LAB — CMP14+EGFR
ALBUMIN: 4.4 g/dL (ref 3.6–4.8)
ALT: 17 IU/L (ref 0–32)
AST: 14 IU/L (ref 0–40)
Albumin/Globulin Ratio: 1.7 (ref 1.2–2.2)
Alkaline Phosphatase: 77 IU/L (ref 39–117)
BUN / CREAT RATIO: 11 — AB (ref 12–28)
BUN: 18 mg/dL (ref 8–27)
Bilirubin Total: 0.4 mg/dL (ref 0.0–1.2)
CALCIUM: 10.2 mg/dL (ref 8.7–10.3)
CO2: 28 mmol/L (ref 20–29)
CREATININE: 1.66 mg/dL — AB (ref 0.57–1.00)
Chloride: 95 mmol/L — ABNORMAL LOW (ref 96–106)
GFR, EST AFRICAN AMERICAN: 38 mL/min/{1.73_m2} — AB (ref >59)
GFR, EST NON AFRICAN AMERICAN: 33 mL/min/{1.73_m2} — AB (ref >59)
Globulin, Total: 2.6 g/dL (ref 1.5–4.5)
Glucose: 90 mg/dL (ref 65–99)
Potassium: 4.6 mmol/L (ref 3.5–5.2)
Sodium: 139 mmol/L (ref 134–144)
Total Protein: 7 g/dL (ref 6.0–8.5)

## 2017-12-23 NOTE — Telephone Encounter (Signed)
Called pt to reschedule their appt 02/13/18. Left VM °

## 2018-01-01 ENCOUNTER — Other Ambulatory Visit: Payer: Self-pay | Admitting: Physician Assistant

## 2018-01-01 DIAGNOSIS — R42 Dizziness and giddiness: Secondary | ICD-10-CM

## 2018-01-02 NOTE — Telephone Encounter (Signed)
Antivert refill Last Refill:12/22/17 # 30; no refills Last OV: 12/22/17 PCP: Dr. Clelia CroftShaw [facility] [place]

## 2018-01-04 ENCOUNTER — Other Ambulatory Visit: Payer: Self-pay | Admitting: Family Medicine

## 2018-01-04 NOTE — Telephone Encounter (Signed)
Cetirizzine 120mg   refill Last Refill:120mg  # 24 Last OV: 12/22/17 PCP: Norberto SorensonEva  Shaw MD [id number]

## 2018-01-10 ENCOUNTER — Other Ambulatory Visit: Payer: Self-pay | Admitting: Family Medicine

## 2018-01-10 NOTE — Telephone Encounter (Signed)
Hydroxyzine 50 mg cap. Refill; LRF 10/30/17; # 270, no refills Buspirone 5 mg tab. Refill; LRF 10/22/17; # 270; 1 refills ([facility] )  Last OV: 10/22/17 PCP: Dr. Clelia CroftShaw [facility] [place] [facility] [address]   Spoke with pt.  Stated she prefers to [facility] both of above [facility] [place].  Reported she wants to continue to have her "regular medication [facility]   Antianxiety medications are not addressed in the Refill Protocol, so this was not refilled per Triage nurse.

## 2018-01-14 NOTE — Telephone Encounter (Signed)
Pt. Called again requesting status, per 3 day policy uping to high priority

## 2018-01-19 ENCOUNTER — Other Ambulatory Visit: Payer: Self-pay | Admitting: Family Medicine

## 2018-01-19 MED ORDER — DULOXETINE HCL 60 MG PO CPEP: 60.0000 mg | ORAL_CAPSULE | Freq: Every day | ORAL | 0 refills | Status: DC

## 2018-01-19 MED ORDER — FUROSEMIDE 40 MG PO TABS: 40.0000 mg | ORAL_TABLET | Freq: Every day | ORAL | 0 refills | Status: DC

## 2018-01-19 MED ORDER — BUSPIRONE HCL 5 MG PO TABS: 5.0000 mg | ORAL_TABLET | Freq: Three times a day (TID) | ORAL | 0 refills | Status: DC

## 2018-01-19 MED ORDER — HYDROXYZINE PAMOATE 50 MG PO CAPS: 50.0000 mg | ORAL_CAPSULE | Freq: Three times a day (TID) | ORAL | 0 refills | Status: DC | PRN

## 2018-01-19 MED ORDER — ESOMEPRAZOLE MAGNESIUM 40 MG PO CPDR: DELAYED_RELEASE_CAPSULE | ORAL | 0 refills | Status: DC

## 2018-01-19 NOTE — Progress Notes (Signed)
Patient requested refills PCP approved PCP [facility] 

## 2018-01-23 ENCOUNTER — Other Ambulatory Visit: Payer: Self-pay | Admitting: Family Medicine

## 2018-01-23 NOTE — Telephone Encounter (Signed)
Melatonin refill Last Refill:09/24/17 #90 Last OV: 10/22/17 Next OV: 02/14/18 PCP: Dr. [name]

## 2018-01-26 ENCOUNTER — Telehealth: Payer: Self-pay | Admitting: Family Medicine

## 2018-01-30 DIAGNOSIS — Z961 Presence of intraocular lens: Secondary | ICD-10-CM | POA: Diagnosis not present

## 2018-02-03 NOTE — Telephone Encounter (Signed)
Error

## 2018-02-13 ENCOUNTER — Ambulatory Visit: Payer: Medicare HMO | Admitting: Family Medicine

## 2018-02-14 ENCOUNTER — Ambulatory Visit: Payer: Self-pay | Admitting: Family Medicine

## 2018-03-06 ENCOUNTER — Other Ambulatory Visit: Payer: Self-pay | Admitting: Family Medicine

## 2018-03-09 ENCOUNTER — Other Ambulatory Visit: Payer: Self-pay | Admitting: Family Medicine

## 2018-03-09 NOTE — Telephone Encounter (Signed)
Requested medication (s) are due for refill today:   Requested medication (s) are on the active medication list: yes    Last refill:   Future visit scheduled yes  Notes to clinic: RX written differently on 12/22/17. Pt states has 2 tabs left  Requested Prescriptions  Pending Prescriptions Disp Refills   prazosin (MINIPRESS) 5 MG capsule 270 capsule 1    Sig: TAKE 2 TO 3 CAPSULES AT BEDTIME     Cardiovascular:  Alpha Blockers Passed - 03/09/2018 10:10 AM      Passed - Last BP in normal range    BP Readings from Last 1 Encounters:  12/22/17 120/72         Passed - Valid encounter within last 6 months    Recent Outpatient Visits          2 months ago Vertigo   [facility], Dema Severin, [name]   4 months ago Dizziness and giddiness   [facility], [name], MD   5 months ago Dizziness and giddiness   [facility], [name], MD   6 months ago Intractable migraine with status migrainosus, unspecified migraine type   [facility], [name], MD   6 months ago Intractable migraine with status migrainosus, unspecified migraine type   [facility], [name], MD      Future Appointments            In 2 weeks [name], MD [facility] [place], [facility]

## 2018-03-09 NOTE — Telephone Encounter (Signed)
Copied from CRM [id number]. Topic: Quick Communication - Rx Refill/Question >> Mar 09, 2018 10:07 AM Baldo Daub L wrote: Medication: prazosin (MINIPRESS) 5 MG capsule  Has the patient contacted their pharmacy? Yes - states they haven't heard from [place].  Pt states she only has two pills left. (Agent: If no, request that the patient contact the pharmacy for the refill.) (Agent: If yes, when and what did the pharmacy advise?)  Preferred Pharmacy (with phone number or street name[id number] [place] [place] - [address] AT [facility] [phone] (Phone) [phone] (Fax)  Agent: Please be advised that RX refills may take up to 3 business days. We ask that you follow-up with your pharmacy.

## 2018-03-10 MED ORDER — PRAZOSIN HCL 5 MG PO CAPS: ORAL_CAPSULE | ORAL | 1 refills | Status: DC

## 2018-03-14 ENCOUNTER — Other Ambulatory Visit: Payer: Self-pay | Admitting: Family Medicine

## 2018-03-14 MED ORDER — PROPRANOLOL HCL 20 MG PO TABS: 20.0000 mg | ORAL_TABLET | Freq: Every day | ORAL | 0 refills | Status: DC

## 2018-03-14 MED ORDER — PRAZOSIN HCL 2 MG PO CAPS: ORAL_CAPSULE | ORAL | 0 refills | Status: DC

## 2018-03-14 NOTE — Telephone Encounter (Signed)
done

## 2018-03-14 NOTE — Telephone Encounter (Signed)
Copied from CRM [id number]. Topic: Quick Communication - Rx Refill/Question >> Mar 14, 2018  9:25 AM Jaquita Rector A wrote: Medication: propranolol (INDERAL) 20 MG tablet   Patient is all out of medication Has the patient contacted their pharmacy? Yes.     Preferred Pharmacy (with phone number or street name[id number] [place] [place] - [address] AT [facility] [phone] ([phone] (Fax)    Agent: Please be advised that RX refills may take up to 3 business days. We ask that you follow-up with your pharmacy.

## 2018-03-14 NOTE — Telephone Encounter (Signed)
Copied from CRM [id number]. Topic: Quick Communication - Rx Refill/Question >> Mar 14, 2018  9:49 AM Jaquita Rector A wrote: Medication: prazosin (MINIPRESS) 2 MG capsule  Has the patient contacted their pharmacy? Yes.    Preferred Pharmacy (with phone number or street name): [id number] [place] [place] - [address] AT [facility] [phone] (Phone) [phone] (Fax     Agent: Please be advised that RX refills may take up to 3 business days. We ask that you follow-up with your pharmacy.

## 2018-03-29 ENCOUNTER — Other Ambulatory Visit: Payer: Self-pay

## 2018-03-29 ENCOUNTER — Ambulatory Visit (INDEPENDENT_AMBULATORY_CARE_PROVIDER_SITE_OTHER): Payer: Medicare HMO | Admitting: Family Medicine

## 2018-03-29 ENCOUNTER — Encounter: Payer: Self-pay | Admitting: Family Medicine

## 2018-03-29 VITALS — BP 105/68 | HR 73 | Temp 98.0°F | Resp 16 | Ht 62.99 in | Wt 203.0 lb

## 2018-03-29 DIAGNOSIS — R7303 Prediabetes: Secondary | ICD-10-CM | POA: Diagnosis not present

## 2018-03-29 DIAGNOSIS — E039 Hypothyroidism, unspecified: Secondary | ICD-10-CM | POA: Diagnosis not present

## 2018-03-29 DIAGNOSIS — N182 Chronic kidney disease, stage 2 (mild): Secondary | ICD-10-CM | POA: Diagnosis not present

## 2018-03-29 DIAGNOSIS — R718 Other abnormality of red blood cells: Secondary | ICD-10-CM

## 2018-03-29 DIAGNOSIS — K219 Gastro-esophageal reflux disease without esophagitis: Secondary | ICD-10-CM | POA: Diagnosis not present

## 2018-03-29 DIAGNOSIS — Z79899 Other long term (current) drug therapy: Secondary | ICD-10-CM

## 2018-03-29 DIAGNOSIS — E038 Other specified hypothyroidism: Secondary | ICD-10-CM

## 2018-03-29 DIAGNOSIS — D509 Iron deficiency anemia, unspecified: Secondary | ICD-10-CM | POA: Diagnosis not present

## 2018-03-29 DIAGNOSIS — K580 Irritable bowel syndrome with diarrhea: Secondary | ICD-10-CM

## 2018-03-29 DIAGNOSIS — E782 Mixed hyperlipidemia: Secondary | ICD-10-CM

## 2018-03-29 DIAGNOSIS — F411 Generalized anxiety disorder: Secondary | ICD-10-CM

## 2018-03-29 LAB — POCT URINALYSIS DIP (MANUAL ENTRY)
BILIRUBIN UA: NEGATIVE mg/dL
Bilirubin, UA: NEGATIVE
Blood, UA: NEGATIVE
Glucose, UA: NEGATIVE mg/dL
Leukocytes, UA: NEGATIVE
Nitrite, UA: NEGATIVE
PH UA: 6 (ref 5.0–8.0)
PROTEIN UA: NEGATIVE mg/dL
Spec Grav, UA: 1.02 (ref 1.010–1.025)
UROBILINOGEN UA: 0.2 U/dL

## 2018-03-29 LAB — HEMOGLOBIN A1C
Est. average glucose Bld gHb Est-mCnc: 114 mg/dL
HEMOGLOBIN A1C: 5.6 % (ref 4.8–5.6)

## 2018-03-29 MED ORDER — GABAPENTIN 600 MG PO TABS: 600.0000 mg | ORAL_TABLET | Freq: Three times a day (TID) | ORAL | 1 refills | Status: DC

## 2018-03-29 MED ORDER — ONDANSETRON 8 MG PO TBDP: 4.0000 mg | ORAL_TABLET | Freq: Three times a day (TID) | ORAL | 5 refills | Status: DC | PRN

## 2018-03-29 MED ORDER — LEVOTHYROXINE SODIUM 50 MCG PO TABS: 50.0000 ug | ORAL_TABLET | Freq: Every day | ORAL | 1 refills | Status: DC

## 2018-03-29 MED ORDER — ESOMEPRAZOLE MAGNESIUM 40 MG PO CPDR: DELAYED_RELEASE_CAPSULE | ORAL | 1 refills | Status: DC

## 2018-03-29 MED ORDER — PROPRANOLOL HCL 20 MG PO TABS: 20.0000 mg | ORAL_TABLET | Freq: Every evening | ORAL | 1 refills | Status: DC | PRN

## 2018-03-29 MED ORDER — QUETIAPINE FUMARATE 50 MG PO TABS: 50.0000 mg | ORAL_TABLET | Freq: Every evening | ORAL | 1 refills | Status: DC | PRN

## 2018-03-29 MED ORDER — DULOXETINE HCL 60 MG PO CPEP: 60.0000 mg | ORAL_CAPSULE | Freq: Every day | ORAL | 1 refills | Status: DC

## 2018-03-29 MED ORDER — QUETIAPINE FUMARATE 300 MG PO TABS: 300.0000 mg | ORAL_TABLET | Freq: Every day | ORAL | 1 refills | Status: DC

## 2018-03-29 MED ORDER — FAMOTIDINE 40 MG PO TABS: 40.0000 mg | ORAL_TABLET | Freq: Two times a day (BID) | ORAL | 3 refills | Status: DC

## 2018-03-29 MED ORDER — BUSPIRONE HCL 5 MG PO TABS: 5.0000 mg | ORAL_TABLET | Freq: Three times a day (TID) | ORAL | 1 refills | Status: DC

## 2018-03-29 MED ORDER — HYDROXYZINE PAMOATE 50 MG PO CAPS: ORAL_CAPSULE | ORAL | 1 refills | Status: DC

## 2018-03-29 MED ORDER — PRAZOSIN HCL 5 MG PO CAPS: 10.0000 mg | ORAL_CAPSULE | Freq: Every day | ORAL | 1 refills | Status: DC

## 2018-03-29 MED ORDER — PROPRANOLOL HCL 20 MG PO TABS: 40.0000 mg | ORAL_TABLET | Freq: Every day | ORAL | 1 refills | Status: DC

## 2018-03-29 NOTE — Patient Instructions (Addendum)
   If you have lab work done today you will be contacted with your lab results within the next 2 weeks.  If you have not heard from us then please contact us. The fastest way to get your results is to register for My Chart.   IF you received an x-ray today, you will receive an invoice from [facility]. Please contact [facility] at [phone] with questions or concerns regarding your invoice.   IF you received labwork today, you will receive an invoice from [facility]. Please contact [facility] at [phone] with questions or concerns regarding your invoice.   Our billing staff will not be able to assist you with questions regarding bills from these companies.  You will be contacted with the lab results as soon as they are available. The fastest way to get your results is to activate your My Chart account. Instructions are located on the last page of this paperwork. If you have not heard from us regarding the results in 2 weeks, please contact this office.     Living With Post-Traumatic Stress Disorder If you have been diagnosed with post-traumatic stress disorder (PTSD), you may be relieved that you now know why you have felt or behaved a certain way. Still, you may feel overwhelmed about the treatment ahead. You may also wonder how to get the support you need and how to deal with the condition day-to-day. If you are living with PTSD, there are ways to help you recover from it and manage your symptoms. How to manage lifestyle changes Managing stress Stress is your body's reaction to life changes and events, both good and bad. Stress can make PTSD worse. Take the following steps to cope with stress:  Talk with your health care provider or a counselor if you would like to learn more about techniques to reduce your stress. He or she may suggest some stress reduction techniques such as: ? Muscle relaxation exercises. ? Regular exercise. ? Meditation, yoga, or other  mind-body exercises. ? Breathing exercises. ? Listening to quiet music. ? Spending time outside.  Maintain a healthy lifestyle. Eat a healthy diet, exercise regularly, get plenty of sleep, and take time to relax.  Spend time with others. Talk with them about how you are feeling and what kind of support you need. Try to not isolate yourself, even though you may feel like doing that. Isolating yourself can delay your recovery.  Do activities and hobbies that you enjoy.  Pace yourself when doing stressful things. Take breaks, and reward yourself when you finish. Make sure that you do not overload your schedule.  Medicines Your health care provider may suggest certain medicines if he or she feels that they will help to improve your condition. Antidepressants or antipsychotic medicines may be used to treat PTSD. Avoid using alcohol and other substances that may prevent your medicines from working properly (may interact). It is also important to:  Talk with your pharmacist or health care provider about all medicines that you take, their possible side effects, and which medicines are safe to take together.  Make it your goal to take part in all treatment decisions (shared decision-making). Ask about possible side effects of medicines that your health care provider recommends, and tell him or her how you feel about having those side effects. It is best if shared decision-making with your health care provider is part of your total treatment plan.  If your health care provider prescribes a medicine, you may not notice the   full benefits of it for 4-8 weeks. Most people who are treated for PTSD need to take medicine for at least 6-12 months after they feel better. If you are taking medicines as part of your treatment, do not stop taking medicines before you ask your health care provider if it is safe to stop. You may need to have the medicine slowly decreased (tapered) over time to lower the risk of harmful  side effects. Relationships Many people who have PTSD have difficulty trusting others. Make an effort to:  Take risks and develop trust with close friends and family members. Developing trust in others can help you feel safe and connect you with emotional support.  Be open and honest about your feelings.  Try to have fun and relax in safe spaces, such as with friends and family.  Think about going to couples counseling, family education classes, or family therapy. Your loved ones may not always know how to be supportive. Therapy can be helpful for everyone.  How to recognize changes in your condition Be aware of your symptoms and how often you have them. The following symptoms mean that you need to seek help for your PTSD:  You feel suspicious and angry.  You have repeated flashbacks.  You avoid going out or being with others.  You have an increasing number of fights with close friends or family members, such as your spouse.  You have thoughts about hurting yourself or others.  You cannot get relief from feelings of depression or anxiety.  Where to find support Talking to others  Explain that PTSD is a mental health problem. It is something that a person can develop after experiencing or seeing a life-threatening event. Tell them that PTSD makes you feel stress like you did during the event.  Talk to your loved ones about the symptoms you have. Also tell them what things or situations can cause symptoms to start (are triggers for you).  Assure your loved ones that there are treatments to help PTSD. Discuss possibly seeking family therapy or couples therapy.  If you are worried or fearful about seeking treatment, ask for support. Finances Not all insurance plans cover mental health care, so it is important to check with your insurance carrier. If paying for co-pays or counseling services is a problem, search for a local or county mental health care center. Public mental health  care services may be offered there at a low cost or no cost when you are not able to see a private health care provider. If you are a veteran, contact a local veterans organization or veterans hospital for more information. If you are taking medicine for PTSD, you may be able to get the genericform, which may be less expensive than brand-name medicine. Some makers of prescription medicines also offer help to patients who cannot afford the medicines that they need. Community resources  Find a support group in your community. Often, groups are available for military veterans, trauma victims, and family members or caregivers.  Look into volunteer opportunities. Taking part in these can help you feel more connected to your community.  Contact a local organization to find out if you are eligible for a service dog.  Keep daily contact with at least one trusted friend or family member. Follow these instructions at home: Lifestyle  Exercise regularly. Try to do 30 or more minutes of physical activity on most days of the week.  Try to get 7-9 hours of sleep each night. To help   with sleep: ? Keep your bedroom cool and dark. ? Do not eat a heavy meal during the hour before you go to bed. ? Do not drink alcohol or caffeinated drinks before bed. ? Avoid screen time before bedtime. This means avoiding use of your TV, computer, tablet, and cell phone.  Avoid using alcohol or drugs.  Practice self-soothing skills and use them daily.  Try to have fun and seek humor in your life. General instructions  If your PTSD is affecting your marriage or family, seek help from a family therapist.  Take over-the-counter and prescription medicines only as told by your health care provider.  Make sure to let all of your health care providers know that you have PTSD. This is especially important if you are having surgery or need to be admitted to the hospital.  Keep all follow-up visits as told by your health care  providers. This is important. Where to find more information: Go to this website to find more information about PTSD, treatment for PTSD, and how to get support:  National Center for PTSD: www.ptsd.va.gov  Contact a health care provider if:  Your symptoms get worse or they do not get better. Get help right away if:  You have thoughts about hurting yourself or others. If you ever feel like you may hurt yourself or others, or have thoughts about taking your own life, get help right away. You can go to your nearest emergency department or call:  Your local emergency services (911 in the [place]  A suicide crisis helpline, such as the National Suicide Prevention Lifeline at 1-800-273-8255. This is open 24-hours a day.  Summary  If you are living with PTSD, there are ways to help you recover from it and manage your symptoms.  Find supportive environments and people who understand PTSD. Spend time in those places, and maintain contact with those people.  Work with your health care team to create a management plan that includes counseling, stress management techniques, and healthy lifestyle habits. This information is not intended to replace advice given to you by your health care provider. Make sure you discuss any questions you have with your health care provider. Document Released: 09/02/2016 Document Revised: 09/02/2016 Document Reviewed: 09/02/2016 [employer] Interactive Patient Education  2018 [employer]  

## 2018-03-29 NOTE — Progress Notes (Signed)
Subjective:    Patient: Jennifer Davidson  DOB: 07-10-1955; 62 y.o.   MRN: [id number]  Chief Complaint  Patient presents with  . Chronic Conditions    6 month follow-up    HPI Figidy and easily agitated and if she has a stressful 2-3d she will then have nightmares so Vernia Buff increased her prazosin  Really lets little things get to her and under her skin and then can't let it go. Her daughter Marylene Land has becomes a travel nurse and so Shannie will travel with her which is good for her - good to be away from the house.  Keeps her granddaughter  Buspar helping with anxiety - sometimes takes an extra one at night.  Increased prazosine from 9 to 10mg    Medical History Past Medical History:  Diagnosis Date  . Acute encephalopathy 06/25/2016  . Anxiety   . Cataract 08/31/2017   left eye  . Cholesterol serum increased   . Chronic back pain    chronic Rt low back pain. s/p L4-5 fusion. failed Rt facet injections. poss due to Rt SI joint dysfunction.  . Chronic kidney disease   . Depression   . Diastolic heart failure (HCC)   . GERD (gastroesophageal reflux disease)   . Hyperparathyroidism (HCC)   . Hypertension   . Hypothyroidism   . Migraine   . Neuromuscular disorder (HCC)   . Sleep apnea   . Syncope 08/30/2015  . Tachycardia   . Trochanteric bursitis of both hips 2012   Confirmed on MRI   Past Surgical History:  Procedure Laterality Date  . ABDOMINAL HYSTERECTOMY    . APPENDECTOMY  1986  . BACK SURGERY     L4-5 fusion  . CATARACT EXTRACTION    . KNEE SURGERY     Current Outpatient Medications on File Prior to Visit  Medication Sig Dispense Refill  . busPIRone (BUSPAR) 5 MG tablet Take 1 tablet (5 mg total) by mouth 3 (three) times daily. 270 tablet 0  . [facility] ALLERGY RELIEF-D 5-120 MG tablet TAKE 1 TABLET BY MOUTH EVERY DAY IN THE MORNING 24 tablet 0  . [facility] MELATONIN 5 MG TABS TAKE 1 CAPSULE (5 MG TOTAL) BY MOUTH AT BEDTIME. 120 tablet 0  . diclofenac sodium (VOLTAREN) 1 % GEL  Apply 2 g topically daily as needed (for knee pain).    . DULoxetine (CYMBALTA) 60 MG capsule Take 1 capsule (60 mg total) by mouth daily. 90 capsule 0  . esomeprazole (NEXIUM) 40 MG capsule TAKE 1 CAPSULE EVERY DAY 90 capsule 0  . fluticasone (FLONASE) 50 MCG/ACT nasal spray PLACE 2 SPRAYS AT BEDTIME INTO BOTH NOSTRILS. 16 g 0  . gabapentin (NEURONTIN) 600 MG tablet TAKE 1 TABLET THREE TIMES DAILY 270 tablet 1  . hydrOXYzine (VISTARIL) 100 MG capsule Take 1-2 capsules (100-200 mg total) by mouth at bedtime. 180 capsule 0  . hydrOXYzine (VISTARIL) 50 MG capsule Take 1 capsule (50 mg total) by mouth 3 (three) times daily as needed for anxiety. 270 capsule 0  . levothyroxine (SYNTHROID, LEVOTHROID) 50 MCG tablet Take 1 tablet (50 mcg total) by mouth daily before breakfast. 90 tablet 1  . meclizine (ANTIVERT) 25 MG tablet TAKE 1 TABLET BY MOUTH 3 TIMES DAILY AS NEEDED FOR DIZZINESS 30 tablet 0  . ondansetron (ZOFRAN-ODT) 8 MG disintegrating tablet Take 0.5-1 tablets (4-8 mg total) by mouth every 8 (eight) hours as needed for nausea. 20 tablet 2  . prazosin (MINIPRESS) 2 MG capsule Take 2 pills  along with the 5mg  tablet to make a total of 9mg  dose at bedtime 180 capsule 0  . prazosin (MINIPRESS) 5 MG capsule TAKE 2 TO 3 CAPSULES AT BEDTIME 270 capsule 1  . propranolol (INDERAL) 20 MG tablet Take 1-2 tablets (20-40 mg total) by mouth at bedtime. 180 tablet 0  . QUEtiapine (SEROQUEL) 200 MG tablet Take 1 tablet (200 mg total) by mouth at bedtime. 90 tablet 1  . QUEtiapine (SEROQUEL) 50 MG tablet TAKE 2 TABLETS AT BEDTIME 180 tablet 1  . rizatriptan (MAXALT-MLT) 10 MG disintegrating tablet Take 1 tablet (10 mg total) by mouth as needed for migraine. May repeat in 2 hours if needed 30 tablet 3  . triamcinolone (NASACORT) 55 MCG/ACT AERO nasal inhaler Place 2 sprays into the nose daily. 1 Inhaler 3   No current facility-administered medications on file prior to visit.    Allergies  Allergen Reactions  .  Tramadol Nausea And Vomiting  . Trazodone And Nefazodone Other (See Comments)    Per pt trazodone caused hallucinations and behavior changes    Family History  Problem Relation Age of Onset  . Diabetes Mother   . Heart disease Mother   . Kidney disease Mother   . Thyroid disease Mother   . Heart disease Father   . Seizures Father   . COPD Father   . Irritable bowel syndrome Father   . Cancer Maternal Grandmother   . Diabetes Sister   . Diabetes Brother   . Colon cancer Neg Hx   . Stomach cancer Neg Hx    Social History   Socioeconomic History  . Marital status: Married    Spouse name: Not on file  . Number of children: 3  . Years of education: 56  . Highest education level: Some college, no degree  Occupational History  . Not on file  Social Needs  . Financial resource strain: Not hard at all  . Food insecurity:    Worry: Never true    Inability: Never true  . Transportation needs:    Medical: No    Non-medical: No  Tobacco Use  . Smoking status: Never Smoker  . Smokeless tobacco: Never Used  Substance and Sexual Activity  . Alcohol use: No  . Drug use: No    Comment: 08-31-2016 PER PT NO   . Sexual activity: Yes    Partners: Male    Comment: married  Lifestyle  . Physical activity:    Days per week: 0 days    Minutes per session: 0 min  . Stress: Very much  Relationships  . Social connections:    Talks on phone: More than three times a week    Gets together: More than three times a week    Attends religious service: Never    Active member of club or organization: No    Attends meetings of clubs or organizations: Never    Relationship status: Married  Other Topics Concern  . Not on file  Social History Narrative  . Not on file   Depression screen [facility] 2/9 03/29/2018 12/22/2017 10/22/2017 09/24/2017 08/27/2017  Decreased Interest 0 1 1 1 1   Down, Depressed, Hopeless 0 1 2 0 1  PHQ - 2 Score 0 2 3 1 2   Altered sleeping - 3 2 3 1   Tired, decreased energy -  3 3 3 1   Change in appetite - 0 3 3 1   Feeling bad or failure about yourself  - 1 2 1  1  Trouble concentrating - 1 2 0 1  Moving slowly or fidgety/restless - 3 3 3 1   Suicidal thoughts - 0 0 0 1  PHQ-9 Score - 13 18 14 9   Difficult doing work/chores - Somewhat difficult - Extremely dIfficult -  Some recent data might be hidden    ROS As noted in HPI  Objective:  BP 105/68   Pulse 73   Temp 98 F (36.7 C) (Oral)   Resp 16   Ht 5' 2.99" (1.6 m)   Wt 203 lb (92.1 kg)   SpO2 96%   BMI 35.97 kg/m  Physical Exam  Constitutional: She is oriented to person, place, and time. She appears well-developed and well-nourished. No distress.  HENT:  Head: Normocephalic and atraumatic.  Right Ear: External ear normal.  Left Ear: External ear normal.  Eyes: Conjunctivae are normal. No scleral icterus.  Neck: Normal range of motion. Neck supple. No thyromegaly present.  Cardiovascular: Normal rate, regular rhythm, normal heart sounds and intact distal pulses.  Pulmonary/Chest: Effort normal and breath sounds normal. No respiratory distress.  Musculoskeletal: She exhibits no edema.  Lymphadenopathy:    She has no cervical adenopathy.  Neurological: She is alert and oriented to person, place, and time.  Skin: Skin is warm and dry. She is not diaphoretic. No erythema.  Psychiatric: She has a normal mood and affect. Her behavior is normal.    POC TESTING Office Visit on 03/29/2018  Component Date Value Ref Range Status  . WBC 03/29/2018 8.5  3.4 - 10.8 x10E3/uL Final  . RBC 03/29/2018 4.92  3.77 - 5.28 x10E6/uL Final  . Hemoglobin 03/29/2018 12.1  11.1 - 15.9 g/dL Final  . Hematocrit 16/02/9603 37.7  34.0 - 46.6 % Final  . MCV 03/29/2018 77* 79 - 97 fL Final  . MCH 03/29/2018 24.6* 26.6 - 33.0 pg Final  . MCHC 03/29/2018 32.1  31.5 - 35.7 g/dL Final  . RDW 54/01/8118 15.8* 12.3 - 15.4 % Final  . Platelets 03/29/2018 279  150 - 450 x10E3/uL Final  . Neutrophils 03/29/2018 55  Not Estab.  % Final  . Lymphs 03/29/2018 33  Not Estab. % Final  . Monocytes 03/29/2018 7  Not Estab. % Final  . Eos 03/29/2018 3  Not Estab. % Final  . Basos 03/29/2018 1  Not Estab. % Final  . Neutrophils Absolute 03/29/2018 4.7  1.4 - 7.0 x10E3/uL Final  . Lymphocytes Absolute 03/29/2018 2.8  0.7 - 3.1 x10E3/uL Final  . Monocytes Absolute 03/29/2018 0.6  0.1 - 0.9 x10E3/uL Final  . EOS (ABSOLUTE) 03/29/2018 0.3  0.0 - 0.4 x10E3/uL Final  . Basophils Absolute 03/29/2018 0.1  0.0 - 0.2 x10E3/uL Final  . Immature Granulocytes 03/29/2018 1  Not Estab. % Final  . Immature Grans (Abs) 03/29/2018 0.1  0.0 - 0.1 x10E3/uL Final  . Glucose 03/29/2018 112* 65 - 99 mg/dL Final  . BUN 14/78/2956 10  8 - 27 mg/dL Final  . Creatinine, Ser 03/29/2018 1.28* 0.57 - 1.00 mg/dL Final  . GFR calc non Af Amer 03/29/2018 44* >59 mL/min/1.73 Final  . GFR calc Af Amer 03/29/2018 51* >59 mL/min/1.73 Final  . BUN/Creatinine Ratio 03/29/2018 8* 12 - 28 Final  . Sodium 03/29/2018 140  134 - 144 mmol/L Final  . Potassium 03/29/2018 4.6  3.5 - 5.2 mmol/L Final  . Chloride 03/29/2018 101  96 - 106 mmol/L Final  . CO2 03/29/2018 25  20 - 29 mmol/L Final  . Calcium 03/29/2018 9.6  8.7 - 10.3 mg/dL Final  . Total Protein 03/29/2018 6.5  6.0 - 8.5 g/dL Final  . Albumin 16/10/960411/13/2019 4.3  3.6 - 4.8 g/dL Final  . Globulin, Total 03/29/2018 2.2  1.5 - 4.5 g/dL Final  . Albumin/Globulin Ratio 03/29/2018 2.0  1.2 - 2.2 Final  . Bilirubin Total 03/29/2018 0.4  0.0 - 1.2 mg/dL Final  . Alkaline Phosphatase 03/29/2018 83  39 - 117 IU/L Final  . AST 03/29/2018 16  0 - 40 IU/L Final  . ALT 03/29/2018 12  0 - 32 IU/L Final  . TSH 03/29/2018 2.490  0.450 - 4.500 uIU/mL Final  . T3, Free 03/29/2018 2.2  2.0 - 4.4 pg/mL Final  . Free T4 03/29/2018 1.19  0.82 - 1.77 ng/dL Final  . Hgb V4UA1c MFr Bld 03/29/2018 5.6  4.8 - 5.6 % Final   Comment:          Prediabetes: 5.7 - 6.4          Diabetes: >6.4          Glycemic control for adults with  diabetes: <7.0   . Est. average glucose Bld gHb Est-m* 03/29/2018 114  mg/dL Final  . Cholesterol, Total 03/29/2018 263* 100 - 199 mg/dL Final  . Triglycerides 03/29/2018 281* 0 - 149 mg/dL Final  . HDL 98/11/914711/13/2019 51  >39 mg/dL Final  . VLDL Cholesterol Cal 03/29/2018 56* 5 - 40 mg/dL Final  . LDL Calculated 03/29/2018 829156* 0 - 99 mg/dL Final  . Chol/HDL Ratio 03/29/2018 5.2* 0.0 - 4.4 ratio Final   Comment:                                   T. Chol/HDL Ratio                                             Men  Women                               1/2 Avg.Risk  3.4    3.3                                   Avg.Risk  5.0    4.4                                2X Avg.Risk  9.6    7.1                                3X Avg.Risk 23.4   11.0   . Creatinine, Urine 03/29/2018 143.4  Not Estab. mg/dL Final  . Microalbumin, Urine 03/29/2018 <3.0  Not Estab. ug/mL Final  . Microalb/Creat Ratio 03/29/2018 <2.1  0.0 - 30.0 mg/g creat Final   Comment:                      Normal:                0.0 -  30.0  Albuminuria:          31.0 - 300.0                      Clinical albuminuria:       >300.0   . Color, UA 03/29/2018 yellow  yellow Final  . Clarity, UA 03/29/2018 clear  clear Final  . Glucose, UA 03/29/2018 negative  negative mg/dL Final  . Bilirubin, UA 03/29/2018 negative  negative Final  . Ketones, POC UA 03/29/2018 negative  negative mg/dL Final  . Spec Grav, UA 03/29/2018 1.020  1.010 - 1.025 Final  . Blood, UA 03/29/2018 negative  negative Final  . pH, UA 03/29/2018 6.0  5.0 - 8.0 Final  . Protein Ur, POC 03/29/2018 negative  negative mg/dL Final  . Urobilinogen, UA 03/29/2018 0.2  0.2 or 1.0 E.U./dL Final  . Nitrite, UA 40/98/1191 Negative  Negative Final  . Leukocytes, UA 03/29/2018 Negative  Negative Final  . Total Iron Binding Capacity 03/29/2018 253  250 - 450 ug/dL Final  . UIBC 47/82/9562 195  118 - 369 ug/dL Final  . Iron 13/12/6576 58  27 - 139 ug/dL Final  .  Iron Saturation 03/29/2018 23  15 - 55 % Final  . Ferritin 03/29/2018 51  15 - 150 ng/mL Final     Assessment & Plan:   1. Stage 2 chronic kidney disease   2. Acquired hypothyroidism   3. Prediabetes   4. Other specified hypothyroidism   5. Microcytosis   6. Microcytic anemia   7. Gastroesophageal reflux disease, esophagitis presence not specified   8. Irritable bowel syndrome with diarrhea   9. Encounter for long-term (current) use of medications   10. Mixed hyperlipidemia   11. Generalized anxiety disorder     Consider increaes the cymbalta to 90md (3 30s) at f/u if depression exists. Contact [facility] to see if cna get in with trauma therapist who would be good at EMDR  Patient will continue on current chronic medications other than changes noted above, so ok to refill when needed.   See after visit summary for patient specific instructions.  Orders Placed This Encounter  Procedures  . CBC with Differential/Platelet  . Comprehensive metabolic panel  . TSH+T4F+T3Free  . Hemoglobin A1c  . Lipid panel    Order Specific Question:   Has the patient fasted?    Answer:   Yes  . Microalbumin/Creatinine Ratio, Urine  . Iron, TIBC and Ferritin Panel  . POCT urinalysis dipstick    Meds ordered this encounter  Medications  . busPIRone (BUSPAR) 5 MG tablet    Sig: Take 1 tablet (5 mg total) by mouth 3 (three) times daily. May take 4th dose in evening for anxiety exacerbation    Dispense:  360 tablet    Refill:  1  . DULoxetine (CYMBALTA) 60 MG capsule    Sig: Take 1 capsule (60 mg total) by mouth daily.    Dispense:  90 capsule    Refill:  1  . esomeprazole (NEXIUM) 40 MG capsule    Sig: TAKE 1 CAPSULE EVERY DAY    Dispense:  90 capsule    Refill:  1  . famotidine (PEPCID) 40 MG tablet    Sig: Take 1 tablet (40 mg total) by mouth 2 (two) times daily.    Dispense:  180 tablet    Refill:  3  . gabapentin (NEURONTIN) 600 MG tablet    Sig: Take 1 tablet (600 mg total) by  mouth  3 (three) times daily.    Dispense:  270 tablet    Refill:  1  . hydrOXYzine (VISTARIL) 50 MG capsule    Sig: Take 1 tab with supper, 1 tab po qhs, and may repeat 1 tab po x 1 if awakens    Dispense:  270 capsule    Refill:  1  . levothyroxine (SYNTHROID, LEVOTHROID) 50 MCG tablet    Sig: Take 1 tablet (50 mcg total) by mouth daily before breakfast.    Dispense:  90 tablet    Refill:  1  . ondansetron (ZOFRAN-ODT) 8 MG disintegrating tablet    Sig: Take 0.5-1 tablets (4-8 mg total) by mouth every 8 (eight) hours as needed for nausea (from migraines).    Dispense:  20 tablet    Refill:  5  . prazosin (MINIPRESS) 5 MG capsule    Sig: Take 2 capsules (10 mg total) by mouth at bedtime. TAKE 2 TO 3 CAPSULES AT BEDTIME    Dispense:  180 capsule    Refill:  1  . DISCONTD: propranolol (INDERAL) 20 MG tablet    Sig: Take 1 tablet (20 mg total) by mouth at bedtime as needed and may repeat dose one time if needed (for migraine headaches).    Dispense:  180 tablet    Refill:  1  . QUEtiapine (SEROQUEL) 50 MG tablet    Sig: Take 1 tablet (50 mg total) by mouth at bedtime as needed and may repeat dose one time if needed.    Dispense:  180 tablet    Refill:  1  . QUEtiapine (SEROQUEL) 300 MG tablet    Sig: Take 1 tablet (300 mg total) by mouth at bedtime.    Dispense:  90 tablet    Refill:  1  . propranolol (INDERAL) 20 MG tablet    Sig: Take 2 tablets (40 mg total) by mouth at bedtime. And repeat 20-40mg  po x 1 if migraine headache occurs    Dispense:  360 tablet    Refill:  1    D/c prior order for prn propranolol #180/mo only    Patient verbalized to me that they understand the following: diagnosis, what is being done for them, what to expect and what should be done at home.  Their questions have been answered. They understand that I am unable to predict every possible medication interaction or adverse outcome and that if any unexpected symptoms arise, they should contact us and their  pharmacist, as well as never hesitate to seek urgent/emergent care at [facility] or ER if they think it might be warranted.    Over 40 min spent in face-to-face evaluation of and consultation with patient and coordination of care.  Over 50% of this time was spent counseling this patient regarding need for EMDR therapy, importance of therapy when at a good place, med adjustments to treat worsening depression/anxiety in light of prior trials.  Norberto Sorenson, MD, MPH Primary Care at [facility] [address] [place], [place]  [address] [phone] Office phone  [phone] Office fax  03/29/18 8:05 AM

## 2018-03-30 LAB — COMPREHENSIVE METABOLIC PANEL
ALK PHOS: 83 IU/L (ref 39–117)
ALT: 12 IU/L (ref 0–32)
AST: 16 IU/L (ref 0–40)
Albumin/Globulin Ratio: 2 (ref 1.2–2.2)
Albumin: 4.3 g/dL (ref 3.6–4.8)
BILIRUBIN TOTAL: 0.4 mg/dL (ref 0.0–1.2)
BUN/Creatinine Ratio: 8 — ABNORMAL LOW (ref 12–28)
BUN: 10 mg/dL (ref 8–27)
CHLORIDE: 101 mmol/L (ref 96–106)
CO2: 25 mmol/L (ref 20–29)
CREATININE: 1.28 mg/dL — AB (ref 0.57–1.00)
Calcium: 9.6 mg/dL (ref 8.7–10.3)
GFR calc Af Amer: 51 mL/min/{1.73_m2} — ABNORMAL LOW (ref >59)
GFR calc non Af Amer: 44 mL/min/{1.73_m2} — ABNORMAL LOW (ref >59)
GLUCOSE: 112 mg/dL — AB (ref 65–99)
Globulin, Total: 2.2 g/dL (ref 1.5–4.5)
Potassium: 4.6 mmol/L (ref 3.5–5.2)
Sodium: 140 mmol/L (ref 134–144)
Total Protein: 6.5 g/dL (ref 6.0–8.5)

## 2018-03-30 LAB — IRON,TIBC AND FERRITIN PANEL
Ferritin: 51 ng/mL (ref 15–150)
IRON SATURATION: 23 % (ref 15–55)
Iron: 58 ug/dL (ref 27–139)
Total Iron Binding Capacity: 253 ug/dL (ref 250–450)
UIBC: 195 ug/dL (ref 118–369)

## 2018-03-30 LAB — CBC WITH DIFFERENTIAL/PLATELET
BASOS ABS: 0.1 10*3/uL (ref 0.0–0.2)
Basos: 1 %
EOS (ABSOLUTE): 0.3 10*3/uL (ref 0.0–0.4)
Eos: 3 %
Hematocrit: 37.7 % (ref 34.0–46.6)
Hemoglobin: 12.1 g/dL (ref 11.1–15.9)
Immature Grans (Abs): 0.1 10*3/uL (ref 0.0–0.1)
Immature Granulocytes: 1 %
LYMPHS ABS: 2.8 10*3/uL (ref 0.7–3.1)
Lymphs: 33 %
MCH: 24.6 pg — AB (ref 26.6–33.0)
MCHC: 32.1 g/dL (ref 31.5–35.7)
MCV: 77 fL — ABNORMAL LOW (ref 79–97)
MONOS ABS: 0.6 10*3/uL (ref 0.1–0.9)
Monocytes: 7 %
NEUTROS ABS: 4.7 10*3/uL (ref 1.4–7.0)
Neutrophils: 55 %
PLATELETS: 279 10*3/uL (ref 150–450)
RBC: 4.92 x10E6/uL (ref 3.77–5.28)
RDW: 15.8 % — AB (ref 12.3–15.4)
WBC: 8.5 10*3/uL (ref 3.4–10.8)

## 2018-03-30 LAB — LIPID PANEL
CHOL/HDL RATIO: 5.2 ratio — AB (ref 0.0–4.4)
Cholesterol, Total: 263 mg/dL — ABNORMAL HIGH (ref 100–199)
HDL: 51 mg/dL (ref >39)
LDL Calculated: 156 mg/dL — ABNORMAL HIGH (ref 0–99)
TRIGLYCERIDES: 281 mg/dL — AB (ref 0–149)
VLDL Cholesterol Cal: 56 mg/dL — ABNORMAL HIGH (ref 5–40)

## 2018-03-30 LAB — TSH+T4F+T3FREE
Free T4: 1.19 ng/dL (ref 0.82–1.77)
T3 FREE: 2.2 pg/mL (ref 2.0–4.4)
TSH: 2.49 u[IU]/mL (ref 0.450–4.500)

## 2018-03-30 LAB — MICROALBUMIN / CREATININE URINE RATIO
Creatinine, Urine: 143.4 mg/dL
Microalbumin, Urine: <3 ug/mL

## 2018-04-07 ENCOUNTER — Other Ambulatory Visit: Payer: Self-pay

## 2018-04-07 ENCOUNTER — Telehealth: Payer: Self-pay

## 2018-04-07 MED ORDER — QUETIAPINE FUMARATE 300 MG PO TABS: 300.0000 mg | ORAL_TABLET | Freq: Every day | ORAL | 0 refills | Status: DC

## 2018-04-07 NOTE — Telephone Encounter (Signed)
Fax req to clarify directions: Per Dr. Clelia CroftShaw: Pt takes 2 tablets of 5mg  tabs for a total of 10mg  at bedtime. IF pt awakens, she can take 2 tablets of 2mg  tabs for an additional total of 4mg  during the night.   [facility].

## 2018-04-14 ENCOUNTER — Other Ambulatory Visit: Payer: Self-pay | Admitting: Family Medicine

## 2018-04-30 ENCOUNTER — Other Ambulatory Visit: Payer: Self-pay | Admitting: Family Medicine

## 2018-05-01 DIAGNOSIS — S43402A Unspecified sprain of left shoulder joint, initial encounter: Secondary | ICD-10-CM | POA: Diagnosis not present

## 2018-05-01 DIAGNOSIS — M67912 Unspecified disorder of synovium and tendon, left shoulder: Secondary | ICD-10-CM | POA: Diagnosis not present

## 2018-05-01 NOTE — Telephone Encounter (Signed)
Requested medication (s) are due for refill today: Yes  Requested medication (s) are on the active medication list: Yes  Last refill:  04/07/18  Future visit scheduled: Yes  Notes to clinic:  See request    Requested Prescriptions  Pending Prescriptions Disp Refills   QUEtiapine (SEROQUEL) 300 MG tablet [Pharmacy Med Name: QUETIAPINE FUMARATE 300 MG TAB] 30 tablet 0    Sig: TAKE 1 TABLET BY MOUTH EVERYDAY AT BEDTIME     Not Delegated - Psychiatry:  Antipsychotics - Second Generation (Atypical) - quetiapine Failed - 04/30/2018  1:22 AM      Failed - This refill cannot be delegated      Passed - ALT in normal range and within 180 days    ALT  Date Value Ref Range Status  03/29/2018 12 0 - 32 IU/L Final         Passed - AST in normal range and within 180 days    AST  Date Value Ref Range Status  03/29/2018 16 0 - 40 IU/L Final         Passed - Completed PHQ-2 or PHQ-9 in the last 360 days.      Passed - Last BP in normal range    BP Readings from Last 1 Encounters:  03/29/18 105/68         Passed - Valid encounter within last 6 months    Recent Outpatient Visits          1 month ago Stage 2 chronic kidney disease   [facility], Levell JulyEva N, MD   4 months ago Vertigo   [facility], Dema SeverinSarah L, [name]   6 months ago Dizziness and giddiness   [facility], Levell JulyEva N, MD   7 months ago Dizziness and giddiness   [facility], Levell JulyEva N, MD   8 months ago Intractable migraine with status migrainosus, unspecified migraine type   [facility], Levell JulyEva N, MD      Future Appointments            In 2 months Sherren MochaShaw, Eva N, MD [facility]

## 2018-05-08 IMAGING — CT CT CHEST W/O CM
2 of 4 series · 15 of 36 positions shown, 18 images · non-contrast
Comparison: Plain film of the chest and ribs 08/30/2015 and
09/18/2015.

CLINICAL DATA: Status post fall 3 weeks ago with or sternal
fracture. Hemoptysis. Initial encounter.

EXAM:
CT CHEST WITHOUT CONTRAST
TECHNIQUE: Multidetector CT imaging of the chest was performed following the
standard protocol without IV contrast.

[Series 2: chest w/(date) · axial · 0.68mm/px · z∈[-195,+27]mm · 12 of 133 slices shown, 15 images]
[im 11/133  mediastinal]
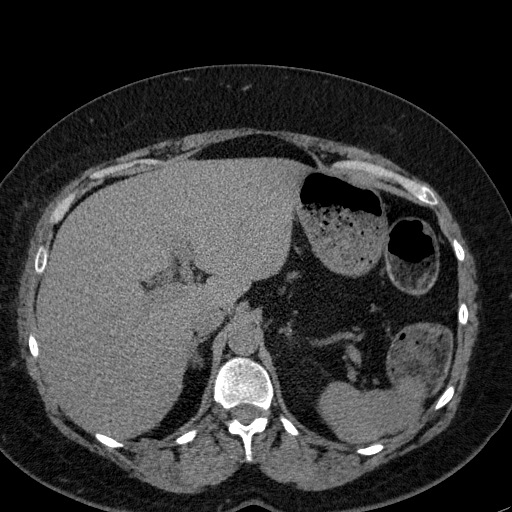
[im 11/133  lung]
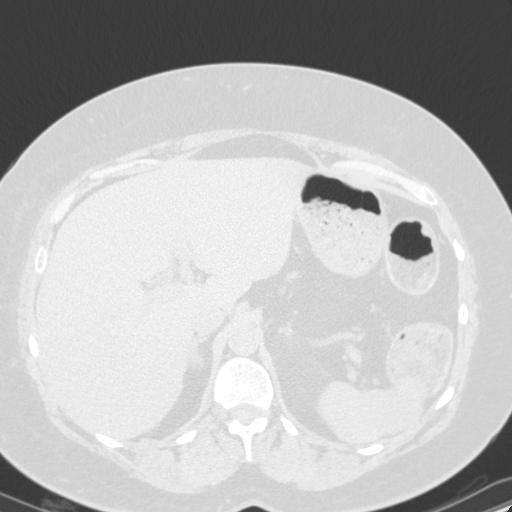
[im 21/133  lung]
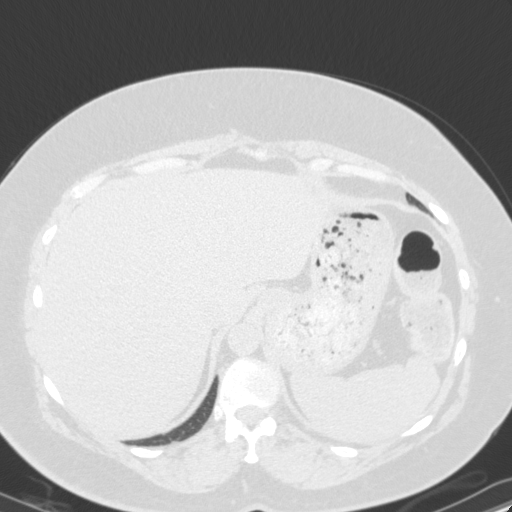
[im 31/133  lung]
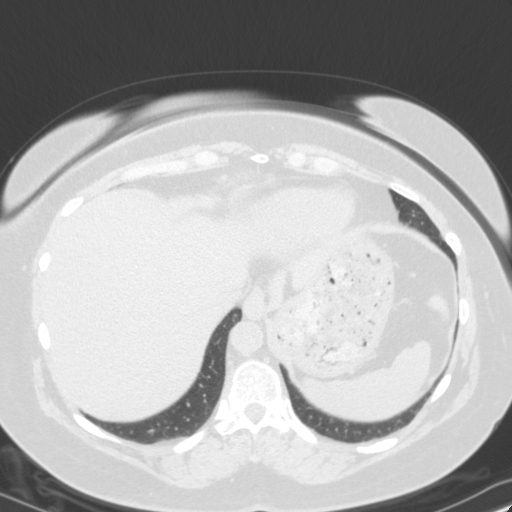
[im 41/133  lung]
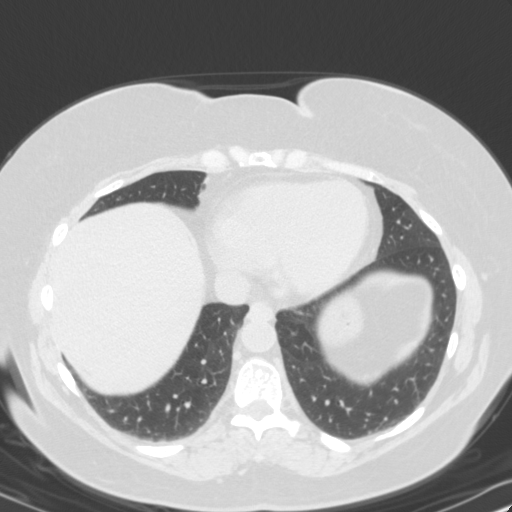
[im 51/133  mediastinal]
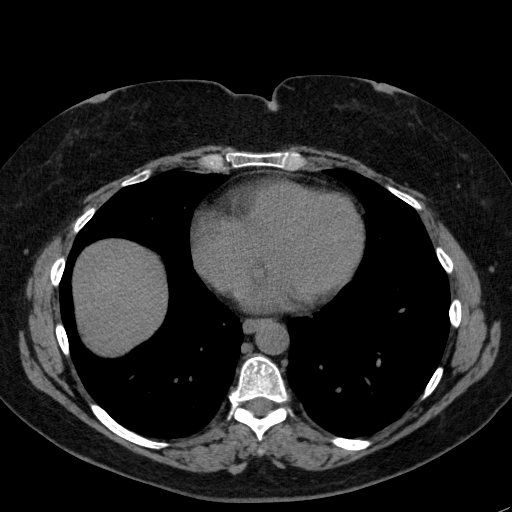
[im 51/133  lung]
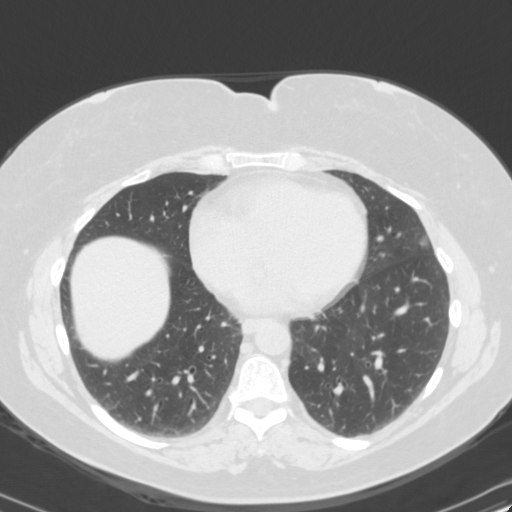
[im 61/133  lung]
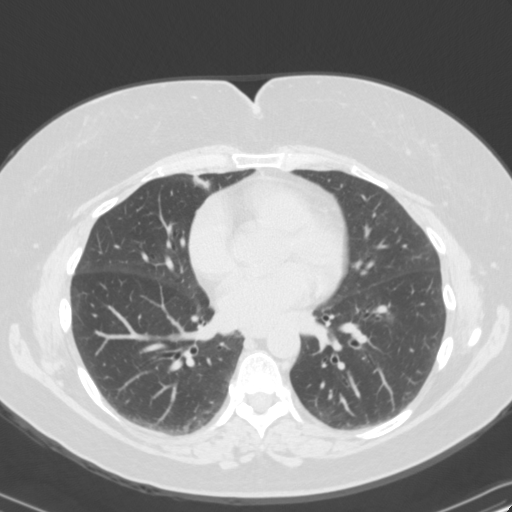
[im 72/133  lung]
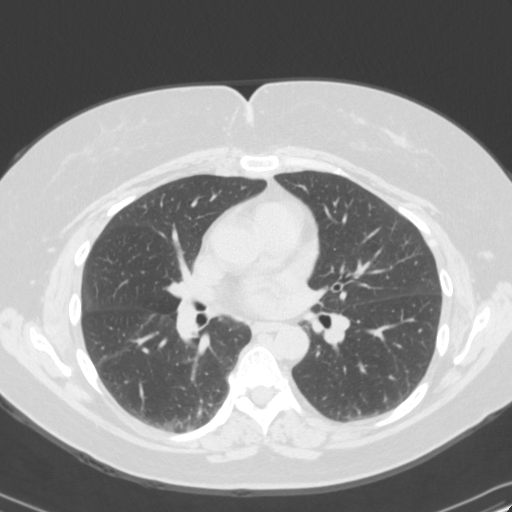
[im 82/133  lung]
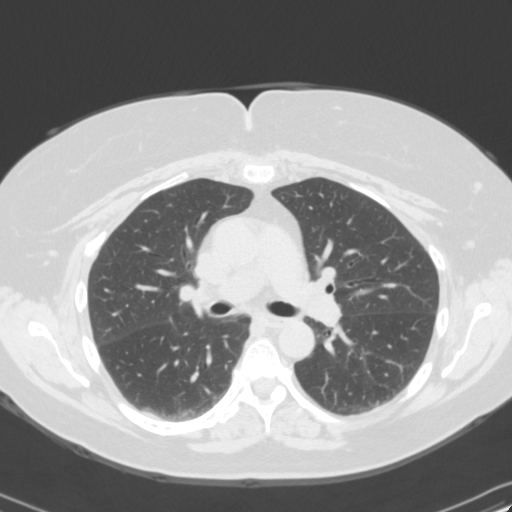
[im 92/133  mediastinal]
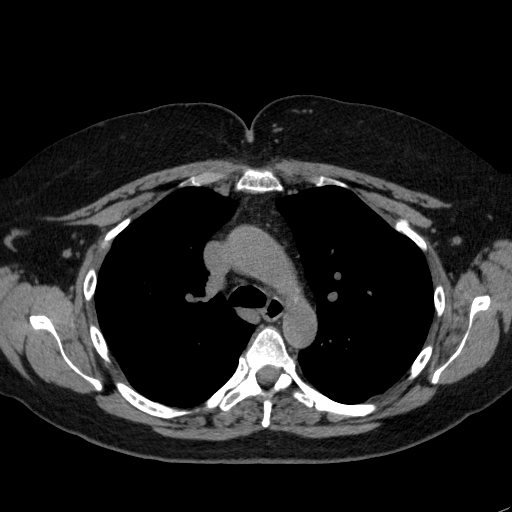
[im 92/133  lung]
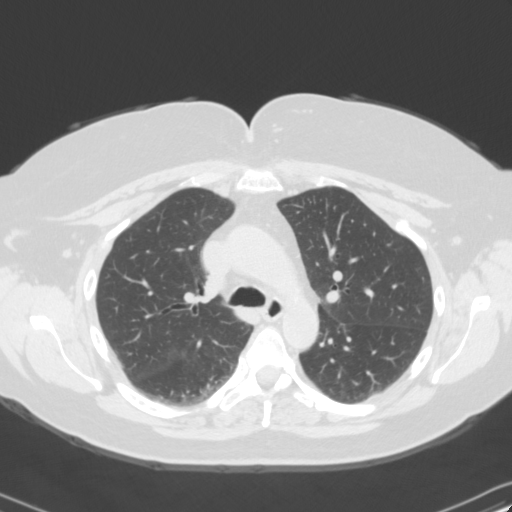
[im 102/133  lung]
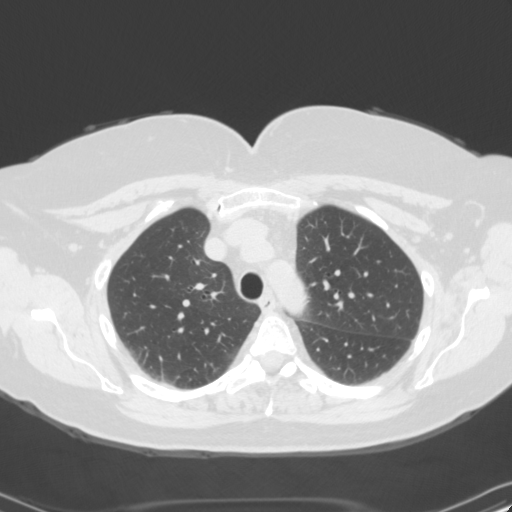
[im 112/133  lung]
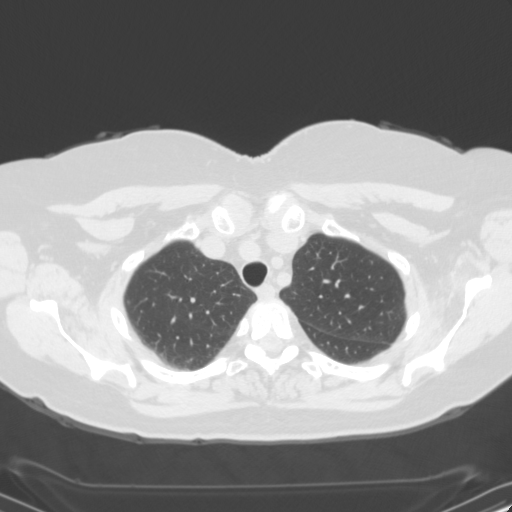
[im 122/133  lung]
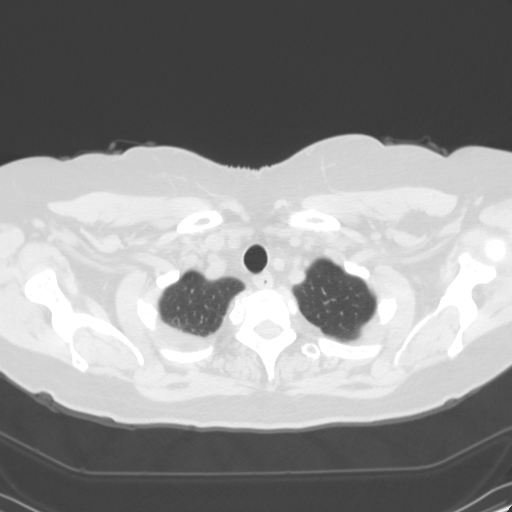

[Series 3: cor · coronal · 0.52mm/px · 3 of 138 slices shown]
[im 28/138  lung]
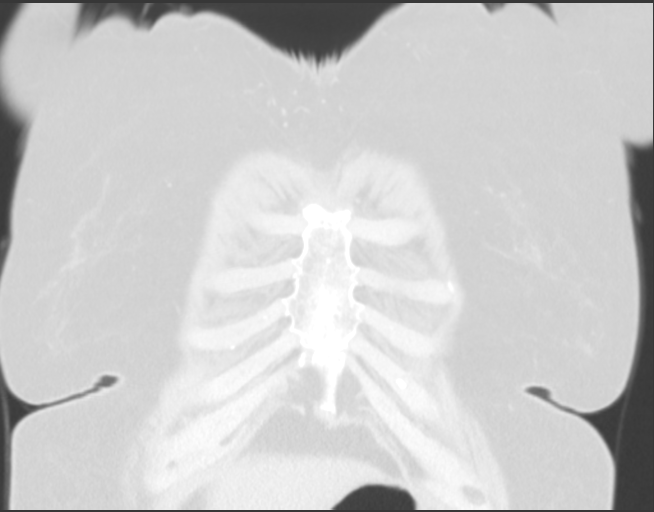
[im 55/138  lung]
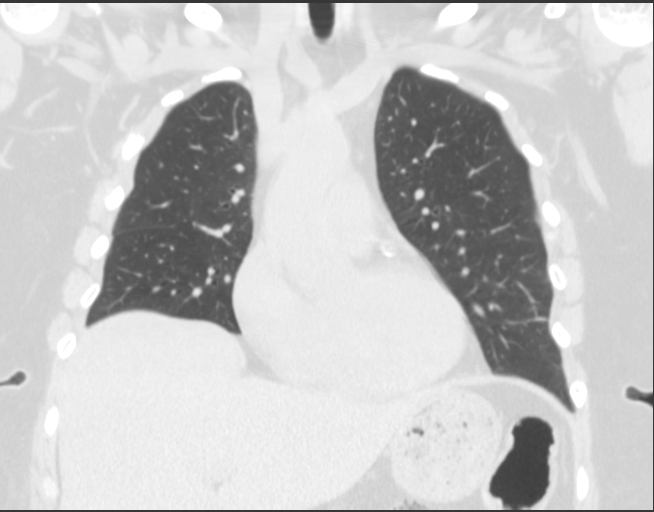
[im 83/138  lung]
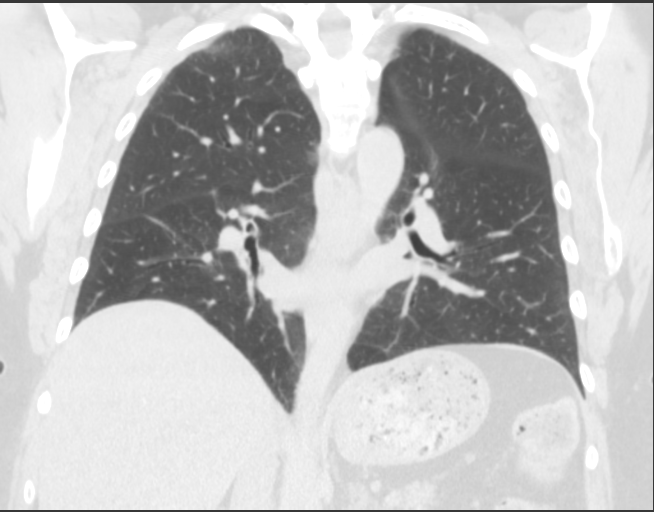

[15 of 36 positions shown; findings below may reference images not displayed]

FINDINGS: There is no axillary, hilar or mediastinal lymphadenopathy. Calcific
coronary artery disease is seen. No pleural or pericardial effusion.
No pneumothorax is identified. The lungs show only mild dependent
atelectasis.

Imaged upper abdomen demonstrates no focal abnormality. Bones
demonstrate a transverse fracture through upper body of the sternum.
The inferior fragment is anteriorly displaced 0.3 cm. The patient
has a subacute appearing fracture of the lateral arc of the right
third rib. On the left, there are subacute appearing fractures of
the anterior, lateral arcs of the left third through fifth ribs.
IMPRESSION: Mildly displaced fracture the body of the sternum as seen on prior
examinations. Subacute appearing fractures of the right third and
left third through fifth ribs are also seen.

No finding to explain the patient's hemoptysis.

Calcific coronary artery disease.

## 2018-05-13 ENCOUNTER — Other Ambulatory Visit: Payer: Self-pay | Admitting: Family Medicine

## 2018-05-15 NOTE — Telephone Encounter (Signed)
Please advise 

## 2018-05-15 NOTE — Telephone Encounter (Signed)
Please let pt or her husband Jennifer Davidson know that refill of this was [facility] and they confirmed receipt.  Please see if they would like [facility] sent to [facility] instead.

## 2018-05-16 NOTE — Telephone Encounter (Signed)
Lowella Bandyikki - just double checking - did you get a chance to speak w/ Loraine LericheMark or Jayden and let them know? Thanks so much.

## 2018-05-20 MED ORDER — BUSPIRONE HCL 5 MG PO TABS: 5.0000 mg | ORAL_TABLET | Freq: Three times a day (TID) | ORAL | 1 refills | Status: DC

## 2018-05-20 NOTE — Addendum Note (Signed)
Addended by: [name] on: 05/20/2018 11:06 AM   Modules accepted: Orders

## 2018-05-22 ENCOUNTER — Encounter: Payer: Self-pay | Admitting: Family Medicine

## 2018-05-22 ENCOUNTER — Ambulatory Visit (INDEPENDENT_AMBULATORY_CARE_PROVIDER_SITE_OTHER): Payer: Medicare HMO | Admitting: Family Medicine

## 2018-05-22 ENCOUNTER — Ambulatory Visit (INDEPENDENT_AMBULATORY_CARE_PROVIDER_SITE_OTHER): Payer: Medicare HMO

## 2018-05-22 ENCOUNTER — Other Ambulatory Visit: Payer: Self-pay

## 2018-05-22 VITALS — BP 90/58 | HR 73 | Temp 98.6°F | Ht 62.5 in | Wt 202.6 lb

## 2018-05-22 DIAGNOSIS — I959 Hypotension, unspecified: Secondary | ICD-10-CM

## 2018-05-22 DIAGNOSIS — R05 Cough: Secondary | ICD-10-CM | POA: Diagnosis not present

## 2018-05-22 DIAGNOSIS — R509 Fever, unspecified: Secondary | ICD-10-CM | POA: Diagnosis not present

## 2018-05-22 DIAGNOSIS — R059 Cough, unspecified: Secondary | ICD-10-CM

## 2018-05-22 DIAGNOSIS — B349 Viral infection, unspecified: Secondary | ICD-10-CM

## 2018-05-22 LAB — POCT CBC
Granulocyte percent: 59.1 %G (ref 37–80)
HCT, POC: 38 % (ref 29–41)
Hemoglobin: 12.9 g/dL (ref 11–14.6)
Lymph, poc: 2.8 (ref 0.6–3.4)
MCH, POC: 26.4 pg — AB (ref 27–31.2)
MCHC: 34.1 g/dL (ref 31.8–35.4)
MCV: 76.5 fL (ref 76–111)
MID (cbc): 1 — AB (ref 0–0.9)
MPV: 6 fL (ref 0–99.8)
POC Granulocyte: 5.5 (ref 2–6.9)
POC LYMPH PERCENT: 30.5 %L (ref 10–50)
POC MID %: 10.4 %M (ref 0–12)
Platelet Count, POC: 254 10*3/uL (ref 142–424)
RBC: 4.96 M/uL (ref 4.04–5.48)
RDW, POC: 16.9 %
WBC: 9.3 10*3/uL (ref 4.6–10.2)

## 2018-05-22 LAB — POC INFLUENZA A&B (BINAX/QUICKVUE)
Influenza A, POC: NEGATIVE
Influenza B, POC: NEGATIVE

## 2018-05-22 MED ORDER — BENZONATATE 100 MG PO CAPS: 100.0000 mg | ORAL_CAPSULE | Freq: Three times a day (TID) | ORAL | 0 refills | Status: DC | PRN

## 2018-05-22 NOTE — Patient Instructions (Signed)
Please push fluids   Hypotension As your heart beats, it forces blood through your body. This force is called blood pressure. If you have hypotension, you have low blood pressure. When your blood pressure is too low, you may not get enough blood to your brain or other parts of your body. This may cause you to feel weak, light-headed, have a fast heartbeat, or even pass out (faint). Low blood pressure may be harmless, or it may cause serious problems. What are the causes?  Blood loss.  Not enough water in the body (dehydration).  Heart problems.  Hormone problems.  Pregnancy.  A very bad infection.  Not having enough of certain nutrients.  Very bad allergic reactions.  Certain medicines. What increases the risk?  Age. The risk increases as you get older.  Conditions that affect the heart or the brain and spinal cord (central nervous system).  Taking certain medicines.  Being pregnant. What are the signs or symptoms?  Feeling: ? Weak. ? Light-headed. ? Dizzy. ? Tired (fatigued).  Blurred vision.  Fast heartbeat.  Passing out, in very bad cases. How is this treated?  Changing your diet. This may involve eating more salt (sodium) or drinking more water.  Taking medicines to raise your blood pressure.  Changing how much you take (the dosage) of some of your medicines.  Wearing compression stockings. These stockings help to prevent blood clots and reduce swelling in your legs. In some cases, you may need to go to the hospital for:  Fluid replacement. This means you will receive fluids through an IV tube.  Blood replacement. This means you will receive donated blood through an IV tube (transfusion).  Treating an infection or heart problems, if this applies.  Monitoring. You may need to be monitored while medicines that you are taking wear off. Follow these instructions at home: Eating and drinking   Drink enough fluids to keep your pee (urine) pale  yellow.  Eat a healthy diet. Follow instructions from your doctor about what you can eat or drink. A healthy diet includes: ? Fresh fruits and vegetables. ? Whole grains. ? Low-fat (lean) meats. ? Low-fat dairy products.  Eat extra salt only as told. Do not add extra salt to your diet unless your doctor tells you to.  Eat small meals often.  Avoid standing up quickly after you eat. Medicines  Take over-the-counter and prescription medicines only as told by your doctor. ? Follow instructions from your doctor about changing how much you take of your medicines, if this applies. ? Do not stop or change any of your medicines on your own. General instructions   Wear compression stockings as told by your doctor.  Get up slowly from lying down or sitting.  Avoid hot showers and a lot of heat as told by your doctor.  Return to your normal activities as told by your doctor. Ask what activities are safe for you.  Do not use any products that contain nicotine or tobacco, such as cigarettes, e-cigarettes, and chewing tobacco. If you need help quitting, ask your doctor.  Keep all follow-up visits as told by your doctor. This is important. Contact a doctor if:  You throw up (vomit).  You have watery poop (diarrhea).  You have a fever for more than 2-3 days.  You feel more thirsty than normal.  You feel weak and tired. Get help right away if:  You have chest pain.  You have a fast or uneven heartbeat.  You lose  feeling (have numbness) in any part of your body.  You cannot move your arms or your legs.  You have trouble talking.  You get sweaty or feel light-headed.  You pass out.  You have trouble breathing.  You have trouble staying awake.  You feel mixed up (confused). Summary  Hypotension is also called low blood pressure. It is when the force of blood pumping through your arteries is too weak.  Hypotension may be harmless, or it may cause serious  problems.  Treatment may include changing your diet and medicines, and wearing compression stockings.  In very bad cases, you may need to go to the hospital. This information is not intended to replace advice given to you by your health care provider. Make sure you discuss any questions you have with your health care provider. Document Released: 07/28/2009 Document Revised: 10/27/2017 Document Reviewed: 10/27/2017 Elsevier Interactive Patient Education  2019 [employer].  Viral Illness, Adult Viruses are tiny germs that can get into a person's body and cause illness. There are many different types of viruses, and they cause many types of illness. Viral illnesses can range from mild to severe. They can affect various parts of the body. Common illnesses that are caused by a virus include colds and the flu. Viral illnesses also include serious conditions such as HIV/AIDS (human immunodeficiency virus/acquired immunodeficiency syndrome). A few viruses have been linked to certain cancers. What are the causes? Many types of viruses can cause illness. Viruses invade cells in your body, multiply, and cause the infected cells to malfunction or die. When the cell dies, it releases more of the virus. When this happens, you develop symptoms of the illness, and the virus continues to spread to other cells. If the virus takes over the function of the cell, it can cause the cell to divide and grow out of control, as is the case when a virus causes cancer. Different viruses get into the body in different ways. You can get a virus by:  Swallowing food or water that is contaminated with the virus.  Breathing in droplets that have been coughed or sneezed into the air by an infected person.  Touching a surface that has been contaminated with the virus and then touching your eyes, nose, or mouth.  Being bitten by an insect or animal that carries the virus.  Having sexual contact with a person who is infected  with the virus.  Being exposed to blood or fluids that contain the virus, either through an open cut or during a transfusion. If a virus enters your body, your body's defense system (immune system) will try to fight the virus. You may be at higher risk for a viral illness if your immune system is weak. What are the signs or symptoms? Symptoms vary depending on the type of virus and the location of the cells that it invades. Common symptoms of the main types of viral illnesses include: Cold and flu viruses  Fever.  Headache.  Sore throat.  Muscle aches.  Nasal congestion.  Cough. Digestive system (gastrointestinal) viruses  Fever.  Abdominal pain.  Nausea.  Diarrhea. Liver viruses (hepatitis)  Loss of appetite.  Tiredness.  Yellowing of the skin (jaundice). Brain and spinal cord viruses  Fever.  Headache.  Stiff neck.  Nausea and vomiting.  Confusion or sleepiness. Skin viruses  Warts.  Itching.  Rash. Sexually transmitted viruses  Discharge.  Swelling.  Redness.  Rash. How is this treated? Viruses can be difficult to treat because  they live within cells. Antibiotic medicines do not treat viruses because these drugs do not get inside cells. Treatment for a viral illness may include:  Resting and drinking plenty of fluids.  Medicines to relieve symptoms. These can include over-the-counter medicine for pain and fever, medicines for cough or congestion, and medicines to relieve diarrhea.  Antiviral medicines. These drugs are available only for certain types of viruses. They may help reduce flu symptoms if taken early. There are also many antiviral medicines for hepatitis and HIV/AIDS. Some viral illnesses can be prevented with vaccinations. A common example is the flu shot. Follow these instructions at home: Medicines   Take over-the-counter and prescription medicines only as told by your health care provider.  If you were prescribed an  antiviral medicine, take it as told by your health care provider. Do not stop taking the medicine even if you start to feel better.  Be aware of when antibiotics are needed and when they are not needed. Antibiotics do not treat viruses. If your health care provider thinks that you may have a bacterial infection as well as a viral infection, you may get an antibiotic. ? Do not ask for an antibiotic prescription if you have been diagnosed with a viral illness. That will not make your illness go away faster. ? Frequently taking antibiotics when they are not needed can lead to antibiotic resistance. When this develops, the medicine no longer works against the bacteria that it normally fights. General instructions  Drink enough fluids to keep your urine clear or pale yellow.  Rest as much as possible.  Return to your normal activities as told by your health care provider. Ask your health care provider what activities are safe for you.  Keep all follow-up visits as told by your health care provider. This is important. How is this prevented? Take these actions to reduce your risk of viral infection:  Eat a healthy diet and get enough rest.  Wash your hands often with soap and water. This is especially important when you are in public places. If soap and water are not available, use hand sanitizer.  Avoid close contact with friends and family who have a viral illness.  If you travel to areas where viral gastrointestinal infection is common, avoid drinking water or eating raw food.  Keep your immunizations up to date. Get a flu shot every year as told by your health care provider.  Do not share toothbrushes, nail clippers, razors, or needles with other people.  Always practice safe sex.  Contact a health care provider if:  You have symptoms of a viral illness that do not go away.  Your symptoms come back after going away.  Your symptoms get worse. Get help right away if:  You have  trouble breathing.  You have a severe headache or a stiff neck.  You have severe vomiting or abdominal pain. This information is not intended to replace advice given to you by your health care provider. Make sure you discuss any questions you have with your health care provider. Document Released: 09/12/2015 Document Revised: 10/15/2015 Document Reviewed: 09/12/2015 Elsevier Interactive Patient Education  2019 [employer]

## 2018-05-22 NOTE — Progress Notes (Signed)
1/6/20208:18 AM  Jennifer Davidson Feb 11, 1956, 63 y.o. female [id number]  Chief Complaint  Patient presents with  . Cough    loss of voice, taking sudafed for the symptoms. Fever last Friday of 102    HPI:   Patient is a 63 y.o. female with past medical history significant for hypothyroidism, anxiety, depression, HLP, prediabetes, who presents today for cough and fever  Cough, headaches, ear pain x 2 weeks Productive cough, greenish Fever for first time 3 days ago, 102 all day Today feeling fatigued, dizziness No chest palpitations No appetite, has been pushing fluids No nausea or vomiting, did have diarrhea for 2 days last week before fever No dysuria, urgency or frequency No blood in stool, no black tarry stools Having SOB Does not smoke, no asthma + h/o diverticulosis Has been taking all her meds as prescribed Had flu vaccine this season + sick contacts   Fall Risk  03/29/2018 12/22/2017 10/22/2017 08/27/2017 08/13/2017  Falls in the past year? 0 Yes Yes No Yes  Comment - - - - -  Number falls in past yr: 0 2 or more 2 or more - 2 or more  Injury with Fall? 0 No Yes - Yes  Comment - - right knee - -  Risk Factor Category  - - - - -  Risk for fall due to : - - - - -  Risk for fall due to: Comment - - - - -  Follow up - - - - -     Depression screen [facility] 2/9 03/29/2018 12/22/2017 10/22/2017  Decreased Interest 0 1 1  Down, Depressed, Hopeless 0 1 2  PHQ - 2 Score 0 2 3  Altered sleeping - 3 2  Tired, decreased energy - 3 3  Change in appetite - 0 3  Feeling bad or failure about yourself  - 1 2  Trouble concentrating - 1 2  Moving slowly or fidgety/restless - 3 3  Suicidal thoughts - 0 0  PHQ-9 Score - 13 18  Difficult doing work/chores - Somewhat difficult -  Some recent data might be hidden    Allergies  Allergen Reactions  . Tramadol Nausea And Vomiting  . Trazodone And Nefazodone Other (See Comments)    Per pt trazodone caused hallucinations and behavior  changes     Prior to Admission medications   Medication Sig Start Date End Date Taking? Authorizing Provider  busPIRone (BUSPAR) 5 MG tablet Take 1 tablet (5 mg total) by mouth 3 (three) times daily. May take 4th dose in evening for anxiety exacerbation 05/20/18  Yes [name], MD  [facility] MELATONIN 5 MG TABS TAKE 1 CAPSULE (5 MG TOTAL) BY MOUTH AT BEDTIME. 01/31/18  Yes [name], MD  diclofenac sodium (VOLTAREN) 1 % GEL Apply 2 g topically daily as needed (for knee pain).   Yes [provider]  DULoxetine (CYMBALTA) 60 MG capsule Take 1 capsule (60 mg total) by mouth daily. 03/29/18  Yes [name], MD  esomeprazole (NEXIUM) 40 MG capsule TAKE 1 CAPSULE EVERY DAY 03/29/18  Yes [name], MD  fexofenadine-pseudoephedrine (ALLEGRA-D 24) 180-240 MG 24 hr tablet Take 1 tablet by mouth daily.   Yes [provider]  fluticasone (FLONASE) 50 MCG/ACT nasal spray PLACE 2 SPRAYS AT BEDTIME INTO BOTH NOSTRILS. 03/06/18  Yes [name], MD  gabapentin (NEURONTIN) 600 MG tablet Take 1 tablet (600 mg total) by mouth 3 (three) times daily. 03/29/18  Yes [name],  [name], MD  hydrOXYzine (VISTARIL) 50 MG capsule Take 1 tab with supper, 1 tab po qhs, and may repeat 1 tab po x 1 if awakens 03/29/18  Yes [name], MD  levothyroxine (SYNTHROID, LEVOTHROID) 50 MCG tablet Take 1 tablet (50 mcg total) by mouth daily before breakfast. 03/29/18  Yes [name], MD  meclizine (ANTIVERT) 25 MG tablet TAKE 1 TABLET BY MOUTH 3 TIMES DAILY AS NEEDED FOR DIZZINESS 01/03/18  Yes Weber, [name], [name]  ondansetron (ZOFRAN-ODT) 8 MG disintegrating tablet Take 0.5-1 tablets (4-8 mg total) by mouth every 8 (eight) hours as needed for nausea (from migraines). 03/29/18  Yes [name], MD  prazosin (MINIPRESS) 2 MG capsule Take 2 pills along with the 5mg  tablet to make a total of 9mg  dose at bedtime 03/14/18  Yes [name], MD  prazosin (MINIPRESS) 5 MG capsule Take 2 capsules (10 mg total) by mouth at bedtime.  TAKE 2 TO 3 CAPSULES AT BEDTIME 03/29/18  Yes [name], MD  propranolol (INDERAL) 20 MG tablet Take 2 tablets (40 mg total) by mouth at bedtime. And repeat 20-40mg  po x 1 if migraine headache occurs 03/29/18  Yes [name], MD  QUEtiapine (SEROQUEL) 300 MG tablet Take 1 tablet (300 mg total) by mouth at bedtime. 04/07/18  Yes [name], MD  QUEtiapine (SEROQUEL) 50 MG tablet Take 1 tablet (50 mg total) by mouth at bedtime as needed and may repeat dose one time if needed. 03/29/18  Yes [name], MD  rizatriptan (MAXALT-MLT) 10 MG disintegrating tablet Take 1 tablet (10 mg total) by mouth as needed for migraine. May repeat in 2 hours if needed 10/14/17  Yes [name], MD  triamcinolone (NASACORT) 55 MCG/ACT AERO nasal inhaler Place 2 sprays into the nose daily. 10/22/17  Yes [name], MD    Past Medical History:  Diagnosis Date  . Acute encephalopathy 06/25/2016  . Anxiety   . Cataract 08/31/2017   left eye  . Cholesterol serum increased   . Chronic back pain    chronic Rt low back pain. s/p L4-5 fusion. failed Rt facet injections. poss due to Rt SI joint dysfunction.  . Chronic kidney disease   . Depression   . Diastolic heart failure (HCC)   . GERD (gastroesophageal reflux disease)   . Hyperparathyroidism (HCC)   . Hypertension   . Hypothyroidism   . Migraine   . Neuromuscular disorder (HCC)   . Sleep apnea   . Syncope 08/30/2015  . Tachycardia   . Trochanteric bursitis of both hips 2012   Confirmed on MRI    Past Surgical History:  Procedure Laterality Date  . ABDOMINAL HYSTERECTOMY    . APPENDECTOMY  1986  . BACK SURGERY     L4-5 fusion  . CATARACT EXTRACTION    . KNEE SURGERY      Social History   Tobacco Use  . Smoking status: Never Smoker  . Smokeless tobacco: Never Used  Substance Use Topics  . Alcohol use: No    Family History  Problem Relation Age of Onset  . Diabetes Mother   . Heart disease Mother   . Kidney disease Mother   . Thyroid  disease Mother   . Heart disease Father   . Seizures Father   . COPD Father   . Irritable bowel syndrome Father   . Cancer Maternal Grandmother   . Diabetes Sister   . Diabetes Brother   .  Colon cancer Neg Hx   . Stomach cancer Neg Hx     ROS Per hpi  OBJECTIVE:  Blood pressure (!) 89/53, pulse 73, temperature 98.6 F (37 C), temperature source Oral, height 5' 2.5" (1.588 m), weight 202 lb 9.6 oz (91.9 kg), SpO2 95  %.  BP Readings from Last 3 Encounters:  05/22/18 (!) 90/58  03/29/18 105/68  12/22/17 120/72    Body mass index is 36.47 kg/m.   Physical Exam Vitals signs and nursing note reviewed.  Constitutional:      Appearance: She is well-developed.  HENT:     Head: Normocephalic and atraumatic.     Mouth/Throat:     Pharynx: No oropharyngeal exudate.  Eyes:     General: No scleral icterus.    Conjunctiva/sclera: Conjunctivae normal.     Pupils: Pupils are equal, round, and reactive to light.  Neck:     Musculoskeletal: Neck supple.  Cardiovascular:     Rate and Rhythm: Normal rate and regular rhythm.     Heart sounds: Normal heart sounds. No murmur. No friction rub. No gallop.   Pulmonary:     Effort: Pulmonary effort is normal.     Breath sounds: Normal breath sounds. No wheezing or rales.  Abdominal:     General: Bowel sounds are normal. There is no distension.     Palpations: Abdomen is soft.     Tenderness: There is abdominal tenderness (generalzied). There is no guarding or rebound.  Skin:    General: Skin is warm and dry.  Neurological:     Mental Status: She is alert and oriented to person, place, and time.     My interpretation of EKG:  Sinus, hr 58, no st changes  Results for orders placed or performed in visit on 05/22/18 (from the past 24 hour(s))  POCT CBC     Status: Abnormal   Collection Time: 05/22/18  8:29 AM  Result Value Ref Range   WBC 9.3 4.6 - 10.2 K/uL   Lymph, poc 2.8 0.6 - 3.4   POC LYMPH PERCENT 30.5 10 - 50 %L   MID  (cbc) 1.0 (A) 0 - 0.9   POC MID % 10.4 0 - 12 %M   POC Granulocyte 5.5 2 - 6.9   Granulocyte percent 59.1 37 - 80 %G   RBC 4.96 4.04 - 5.48 M/uL   Hemoglobin 12.9 11 - 14.6 g/dL   HCT, POC 16.138.0 29 - 41 %   MCV 76.5 76 - 111 fL   MCH, POC 26.4 (A) 27 - 31.2 pg   MCHC 34.1 31.8 - 35.4 g/dL   RDW, POC 09.616.9 %   Platelet Count, POC 254 142 - 424 K/uL   MPV 6.0 0 - 99.8 fL  POC Influenza A&B(BINAX/QUICKVUE)     Status: Normal   Collection Time: 05/22/18  8:44 AM  Result Value Ref Range   Influenza A, POC Negative Negative   Influenza B, POC Negative Negative    Dg Chest 2 View  Result Date: 05/22/2018 CLINICAL DATA:  Cough and fever EXAM: CHEST - 2 VIEW COMPARISON:  02/17/2016 FINDINGS: The heart size and mediastinal contours are within normal limits. Both lungs are clear. The visualized skeletal structures are unremarkable. IMPRESSION: No active cardiopulmonary disease. Electronically Signed   By: Alcide CleverMark  Lukens M.D.   On: 05/22/2018 08:45     ASSESSMENT and PLAN  1. Viral illness Exam, labs and imaging reassuring. Discussed supportive measures, specially importance of pushing fluids, new  meds r/se/b and RTC precautions. Patient educational handout given.  2. Cough - POC Influenza A&B(BINAX/QUICKVUE) - POCT CBC - DG Chest 2 View; Future - Care order/instruction:  3. Fever, unspecified - POC Influenza A&B(BINAX/QUICKVUE) - DG Chest 2 View; Future - Care order/instruction:  4. Hypotension, unspecified hypotension type - EKG 12-Lead  Other orders - benzonatate (TESSALON) 100 MG capsule; Take 1-2 capsules (100-200 mg total) by mouth 3 (three) times daily as needed for cough.  Return if symptoms worsen or fail to improve.    Myles LippsIrma M Santiago, MD [facility] [address] [place], [place] [address] Ph.  [phone] Fax [phone]

## 2018-05-24 DIAGNOSIS — I129 Hypertensive chronic kidney disease with stage 1 through stage 4 chronic kidney disease, or unspecified chronic kidney disease: Secondary | ICD-10-CM | POA: Diagnosis not present

## 2018-05-24 DIAGNOSIS — E785 Hyperlipidemia, unspecified: Secondary | ICD-10-CM | POA: Diagnosis not present

## 2018-05-24 DIAGNOSIS — G709 Myoneural disorder, unspecified: Secondary | ICD-10-CM | POA: Diagnosis not present

## 2018-05-24 DIAGNOSIS — R197 Diarrhea, unspecified: Secondary | ICD-10-CM | POA: Diagnosis not present

## 2018-05-24 DIAGNOSIS — I5030 Unspecified diastolic (congestive) heart failure: Secondary | ICD-10-CM | POA: Diagnosis not present

## 2018-05-24 DIAGNOSIS — E039 Hypothyroidism, unspecified: Secondary | ICD-10-CM | POA: Diagnosis not present

## 2018-05-24 DIAGNOSIS — N183 Chronic kidney disease, stage 3 (moderate): Secondary | ICD-10-CM | POA: Diagnosis not present

## 2018-05-24 DIAGNOSIS — Z9989 Dependence on other enabling machines and devices: Secondary | ICD-10-CM | POA: Diagnosis not present

## 2018-05-24 DIAGNOSIS — G4733 Obstructive sleep apnea (adult) (pediatric): Secondary | ICD-10-CM | POA: Diagnosis not present

## 2018-05-27 ENCOUNTER — Other Ambulatory Visit: Payer: Self-pay | Admitting: Family Medicine

## 2018-06-03 ENCOUNTER — Other Ambulatory Visit: Payer: Self-pay | Admitting: Family Medicine

## 2018-06-04 ENCOUNTER — Other Ambulatory Visit: Payer: Self-pay | Admitting: Family Medicine

## 2018-06-05 NOTE — Telephone Encounter (Signed)
Please Advise

## 2018-06-05 NOTE — Telephone Encounter (Signed)
Requested medication (s) are due for refill today: Yes  Requested medication (s) are on the active medication list: Yes  Last refill:  04/07/18  Future visit scheduled: Yes  Notes to clinic:  Unable to refill per protocol, cannot delegate.     Requested Prescriptions  Pending Prescriptions Disp Refills   QUEtiapine (SEROQUEL) 300 MG tablet [Pharmacy Med Name: QUETIAPINE FUMARATE 300 MG TAB] 90 tablet 1    Sig: TAKE 1 TABLET BY MOUTH EVERYDAY AT BEDTIME     Not Delegated - Psychiatry:  Antipsychotics - Second Generation (Atypical) - quetiapine Failed - 06/03/2018  6:13 PM      Failed - This refill cannot be delegated      Passed - ALT in normal range and within 180 days    ALT  Date Value Ref Range Status  03/29/2018 12 0 - 32 IU/L Final         Passed - AST in normal range and within 180 days    AST  Date Value Ref Range Status  03/29/2018 16 0 - 40 IU/L Final         Passed - Completed PHQ-2 or PHQ-9 in the last 360 days.      Passed - Last BP in normal range    BP Readings from Last 1 Encounters:  05/22/18 (!) 90/58         Passed - Valid encounter within last 6 months    Recent Outpatient Visits          2 weeks ago Viral illness   [facility], Meda CoffeeIrma M, MD   2 months ago Stage 2 chronic kidney disease   [facility], Levell JulyEva N, MD   5 months ago Vertigo   [facility], Dema SeverinSarah L, [name]   7 months ago Dizziness and giddiness   [facility], Levell JulyEva N, MD   8 months ago Dizziness and giddiness   [facility], Levell JulyEva N, MD      Future Appointments            In 1 month Clelia CroftShaw, Levell JulyEva N, MD [facility]

## 2018-06-22 ENCOUNTER — Other Ambulatory Visit: Payer: Self-pay | Admitting: Family Medicine

## 2018-06-22 NOTE — Telephone Encounter (Signed)
Approved per protocol. Requested Prescriptions  Pending Prescriptions Disp Refills  . prazosin (MINIPRESS) 2 MG capsule [Pharmacy Med Name: PRAZOSIN 2 MG CAPSULE] 180 capsule 0    Sig: TAKE 2 TABLETS BY MOUTH ALONG WITH THE 5MG  TABLET TO MAKE A TOTAL OF 9MG  DOSE AT BEDTIME     Cardiovascular:  Alpha Blockers Passed - 06/22/2018  4:10 PM      Passed - Last BP in normal range    BP Readings from Last 1 Encounters:  05/22/18 (!) 90/58         Passed - Valid encounter within last 6 months    Recent Outpatient Visits          1 month ago Viral illness   [facility], Meda Coffee, MD   2 months ago Stage 2 chronic kidney disease   [facility], [name], MD   6 months ago Vertigo   [facility], Dema Severin, [name]   8 months ago Dizziness and giddiness   [facility], [name], MD   9 months ago Dizziness and giddiness   [facility], [name], MD      Future Appointments            In 1 month [name] [name], MD Primary Care at [place], [facility]

## 2018-07-15 ENCOUNTER — Telehealth: Payer: Self-pay | Admitting: Family Medicine

## 2018-07-15 NOTE — Telephone Encounter (Signed)
Tried to call patient to let them know Dr. [name] is no longer at [facility] at [facility] and that their appointment with her is going to be cancelled.   I did LVM for the patient. If patient calls back, please try to get them rescheduled with a different provider or let them know that Dr [name] is going to be working at [employer] the number there is [phone].

## 2018-07-20 DIAGNOSIS — M25512 Pain in left shoulder: Secondary | ICD-10-CM | POA: Diagnosis not present

## 2018-07-20 DIAGNOSIS — M67912 Unspecified disorder of synovium and tendon, left shoulder: Secondary | ICD-10-CM | POA: Diagnosis not present

## 2018-07-24 ENCOUNTER — Ambulatory Visit: Payer: Medicare HMO | Admitting: Family Medicine

## 2018-07-27 DIAGNOSIS — M25512 Pain in left shoulder: Secondary | ICD-10-CM | POA: Diagnosis not present

## 2018-07-28 ENCOUNTER — Other Ambulatory Visit: Payer: Self-pay | Admitting: Family Medicine

## 2018-07-28 NOTE — Telephone Encounter (Signed)
Requested Prescriptions  Pending Prescriptions Disp Refills  . benzonatate (TESSALON) 100 MG capsule [Pharmacy Med Name: BENZONATATE 100 MG CAPSULE] 40 capsule 0    Sig: TAKE 1-2 CAPSULES (100-200 MG TOTAL) BY MOUTH 3 (THREE) TIMES DAILY AS NEEDED FOR COUGH.     Ear, Nose, and Throat:  Antitussives/Expectorants Passed - 07/28/2018 12:54 PM      Passed - Valid encounter within last 12 months    Recent Outpatient Visits          2 months ago Viral illness   [facility], Meda Coffee, MD   4 months ago Stage 2 chronic kidney disease   [facility], [name], MD   7 months ago Vertigo   [facility], Dema Severin, [name]   9 months ago Dizziness and giddiness   [facility], [name], MD   10 months ago Dizziness and giddiness   [facility], [name], MD      Future Appointments            In 3 weeks Myles Lipps, MD [facility] at [place], [facility]

## 2018-08-01 DIAGNOSIS — M24812 Other specific joint derangements of left shoulder, not elsewhere classified: Secondary | ICD-10-CM | POA: Diagnosis not present

## 2018-08-01 DIAGNOSIS — M75122 Complete rotator cuff tear or rupture of left shoulder, not specified as traumatic: Secondary | ICD-10-CM | POA: Diagnosis not present

## 2018-08-22 ENCOUNTER — Ambulatory Visit: Payer: Self-pay | Admitting: *Deleted

## 2018-08-22 NOTE — Telephone Encounter (Signed)
Patient was going into the kitchen- lost balance- and fell while bending over- patient hit her head. Patient has knot on head. Patient's husband has called [facility] and they have advised him not to bring her if he does not have to- to call PCP. Patient does have appointment in the morning for virtual visit. Call to office-they do not have anything available today. Advised patient ED protocol- they do not want to go at this time- advised continue ice therapy, watch for changes- mental, vision, swelling, follow concussion protocol- rest, decrease screen time, hydrate.  Reason for Disposition . Large swelling or bruise > 2 inches (5 cm)  Answer Assessment - Initial Assessment Questions 1. MECHANISM: "How did the injury happen?" For falls, ask: "What height did you fall from?" and "What surface did you fall against?"      Patient lost balance when she bent over and hit her head. Patient hit head on the floor 2. ONSET: "When did the injury happen?" (Minutes or hours ago)      30 minutes ago- 9:30 am 3. NEUROLOGIC SYMPTOMS: "Was there any loss of consciousness?" "Are there any other neurological symptoms?"      No loss of consciousness, blurred vision. Patient does have pain at site- she does have headache 4. MENTAL STATUS: "Does the person know who he is, who you are, and where he is?"      Alert and oriented 5. LOCATION: "What part of the head was hit?"      Left side above the eyebrow 6. SCALP APPEARANCE: "What does the scalp look like? Is it bleeding now?" If so, ask: "Is it difficult to stop?"      Big knot where hit  7. SIZE: For cuts, bruises, or swelling, ask: "How large is it?" (e.g., inches or centimeters)      2" wide- 1 1/2 high- ice on it now 8. PAIN: "Is there any pain?" If so, ask: "How bad is it?"  (e.g., Scale 1-10; or mild, moderate, severe)     Painful now- 9 now- throbbing 9. TETANUS: For any breaks in the skin, ask: "When was the last tetanus booster?"     n/a 10.  OTHER SYMPTOMS: "Do you have any other symptoms?" (e.g., neck pain, vomiting)       no 11. PREGNANCY: "Is there any chance you are pregnant?" "When was your last menstrual period?"       n/a  Protocols used: HEAD INJURY-A-AH

## 2018-08-23 ENCOUNTER — Telehealth (INDEPENDENT_AMBULATORY_CARE_PROVIDER_SITE_OTHER): Payer: Medicare HMO | Admitting: Family Medicine

## 2018-08-23 DIAGNOSIS — F4310 Post-traumatic stress disorder, unspecified: Secondary | ICD-10-CM | POA: Diagnosis not present

## 2018-08-23 DIAGNOSIS — E038 Other specified hypothyroidism: Secondary | ICD-10-CM

## 2018-08-23 DIAGNOSIS — F5105 Insomnia due to other mental disorder: Secondary | ICD-10-CM

## 2018-08-23 DIAGNOSIS — Z79899 Other long term (current) drug therapy: Secondary | ICD-10-CM | POA: Diagnosis not present

## 2018-08-23 DIAGNOSIS — F99 Mental disorder, not otherwise specified: Secondary | ICD-10-CM

## 2018-08-23 DIAGNOSIS — G43709 Chronic migraine without aura, not intractable, without status migrainosus: Secondary | ICD-10-CM

## 2018-08-23 DIAGNOSIS — R42 Dizziness and giddiness: Secondary | ICD-10-CM

## 2018-08-23 DIAGNOSIS — Z9181 History of falling: Secondary | ICD-10-CM

## 2018-08-23 MED ORDER — QUETIAPINE FUMARATE 50 MG PO TABS: 50.0000 mg | ORAL_TABLET | Freq: Every evening | ORAL | 0 refills | Status: DC | PRN

## 2018-08-23 MED ORDER — FLUTICASONE PROPIONATE 50 MCG/ACT NA SUSP: NASAL | 5 refills | Status: DC

## 2018-08-23 MED ORDER — BUSPIRONE HCL 5 MG PO TABS: 5.0000 mg | ORAL_TABLET | Freq: Three times a day (TID) | ORAL | 1 refills | Status: DC

## 2018-08-23 MED ORDER — DICLOFENAC SODIUM 1 % TD GEL: 2.0000 g | Freq: Every day | TRANSDERMAL | 3 refills | Status: DC | PRN

## 2018-08-23 MED ORDER — GABAPENTIN 600 MG PO TABS: 600.0000 mg | ORAL_TABLET | Freq: Three times a day (TID) | ORAL | 1 refills | Status: DC

## 2018-08-23 MED ORDER — HYDROXYZINE HCL 50 MG PO TABS: 50.0000 mg | ORAL_TABLET | Freq: Four times a day (QID) | ORAL | 0 refills | Status: AC | PRN

## 2018-08-23 MED ORDER — LEVOTHYROXINE SODIUM 50 MCG PO TABS: 50.0000 ug | ORAL_TABLET | Freq: Every day | ORAL | 1 refills | Status: DC

## 2018-08-23 MED ORDER — ESOMEPRAZOLE MAGNESIUM 40 MG PO CPDR: DELAYED_RELEASE_CAPSULE | ORAL | 1 refills | Status: DC

## 2018-08-23 MED ORDER — DULOXETINE HCL 60 MG PO CPEP: 60.0000 mg | ORAL_CAPSULE | Freq: Every day | ORAL | 1 refills | Status: DC

## 2018-08-23 MED ORDER — MECLIZINE HCL 25 MG PO TABS: ORAL_TABLET | ORAL | 0 refills | Status: DC

## 2018-08-23 NOTE — Progress Notes (Signed)
Virtual Visit via telephone Note  I connected with patient on 08/23/18 at 946am by telephone and verified that I am speaking with the correct person using two identifiers. Jennifer Davidson is currently located at home and her husband is currently with her during visit. The provider, Myles LippsIrma M Santiago, MD is located in their office at time of visit.  I discussed the limitations, risks, security and privacy concerns of performing an evaluation and management service by telephone and the availability of in person appointments. I also discussed with the patient that there may be a patient responsible charge related to this service. The patient expressed understanding and agreed to proceed.  No chief complaint on file.   Telephone visit today for medication refills  HPI Patient is a 63yo F with past medical history of ? hypothyroidism, anxiety, depression, HLP, prediabetes, OSA, migraines who presents for routine followup  Yesterday lost her balance as she leaned down to scratch her leg and hit her head Denies any LOC Feeling a lot better today, still a bit dizzy, has a bump Denies any vision changes, nausea, vomiting or headache Denies any issues with current falls  Previous patient of Dr Clelia CroftShaw Last labs in Nov 2019 - at goal  Takes seroquel 300mg , prazosin 10mg  every night for for insomnia and anxiety Will take seroquel 50mg  and prazosin 4mg  if nocturnal anxiety/nightmares become sign worse Takes hydroxyzine 50mg  BID Has OSA, uses cpap every night  Waiting on surgery for rotator cuff tear  Fall Risk  08/23/2018 03/29/2018 12/22/2017 10/22/2017 08/27/2017  Falls in the past year? 1 0 Yes Yes No  Comment - - - - -  Number falls in past yr: 0 0 2 or more 2 or more -  Injury with Fall? 1 0 No Yes -  Comment - - - right knee -  Risk Factor Category  - - - - -  Risk for fall due to : - - - - -  Risk for fall due to: Comment - - - - -  Follow up - - - - -     [facility] 08/23/2018  03/29/2018 12/22/2017  Decreased Interest 0 0 1  Down, Depressed, Hopeless 0 0 1  PHQ - 2 Score 0 0 2  Altered sleeping - - 3  Tired, decreased energy - - 3  Change in appetite - - 0  Feeling bad or failure about yourself  - - 1  Trouble concentrating - - 1  Moving slowly or fidgety/restless - - 3  Suicidal thoughts - - 0  PHQ-9 Score - - 13  Difficult doing work/chores - - Somewhat difficult  Some recent data might be hidden    Allergies  Allergen Reactions  . Tramadol Nausea And Vomiting  . Trazodone And Nefazodone Other (See Comments)    Per pt trazodone caused hallucinations and behavior changes     Prior to Admission medications   Medication Sig Start Date End Date Taking? Authorizing Provider  busPIRone (BUSPAR) 5 MG tablet Take 1 tablet (5 mg total) by mouth 3 (three) times daily. May take 4th dose in evening for anxiety exacerbation 05/20/18   Sherren MochaShaw, Eva N, MD  [facility] MELATONIN 5 MG TABS TAKE 1 CAPSULE (5 MG TOTAL) BY MOUTH AT BEDTIME. 01/31/18   Sherren MochaShaw, Eva N, MD  diclofenac sodium (VOLTAREN) 1 % GEL Apply 2 g topically daily as needed (for knee pain).    [provider]  DULoxetine (CYMBALTA) 60 MG capsule  Take 1 capsule (60 mg total) by mouth daily. 03/29/18   [name], MD  esomeprazole (NEXIUM) 40 MG capsule TAKE 1 CAPSULE EVERY DAY 03/29/18   [name], MD  fluticasone (FLONASE) 50 MCG/ACT nasal spray PLACE 2 SPRAYS AT BEDTIME INTO BOTH NOSTRILS. 03/06/18   [name], MD  gabapentin (NEURONTIN) 600 MG tablet Take 1 tablet (600 mg total) by mouth 3 (three) times daily. 03/29/18   [name], MD  hydrOXYzine (VISTARIL) 50 MG capsule Take 1 tab with supper, 1 tab po qhs, and may repeat 1 tab po x 1 if awakens 03/29/18   [name], MD  levothyroxine (SYNTHROID, LEVOTHROID) 50 MCG tablet Take 1 tablet (50 mcg total) by mouth daily before breakfast. 03/29/18   [name], MD  meclizine (ANTIVERT) 25 MG tablet TAKE 1 TABLET BY MOUTH 3 TIMES DAILY AS NEEDED FOR  DIZZINESS 01/03/18   Valarie Cones, Dema Severin, [name]  ondansetron (ZOFRAN-ODT) 8 MG disintegrating tablet Take 0.5-1 tablets (4-8 mg total) by mouth every 8 (eight) hours as needed for nausea (from migraines). 03/29/18   [name], MD  prazosin (MINIPRESS) 2 MG capsule TAKE 2 TABLETS BY MOUTH ALONG WITH THE 5MG  TABLET TO MAKE A TOTAL OF 9MG  DOSE AT BEDTIME 06/22/18   Myles Lipps, MD  prazosin (MINIPRESS) 5 MG capsule Take 2 capsules (10 mg total) by mouth at bedtime. TAKE 2 TO 3 CAPSULES AT BEDTIME 03/29/18   [name], MD  propranolol (INDERAL) 20 MG tablet TAKE 1-2 TABLETS BY MOUTH AT BEDTIME. 06/05/18   [name], MD  QUEtiapine (SEROQUEL) 300 MG tablet TAKE 1 TABLET BY MOUTH EVERYDAY AT BEDTIME 07/04/18   Collie Siad A, MD  rizatriptan (MAXALT-MLT) 10 MG disintegrating tablet Take 1 tablet (10 mg total) by mouth as needed for migraine. May repeat in 2 hours if needed 10/14/17   [name], MD  triamcinolone (NASACORT) 55 MCG/ACT AERO nasal inhaler Place 2 sprays into the nose daily. 10/22/17   [name], MD    Past Medical History:  Diagnosis Date  . Acute encephalopathy 06/25/2016  . Anxiety   . Cataract 08/31/2017   left eye  . Cholesterol serum increased   . Chronic back pain    chronic Rt low back pain. s/p L4-5 fusion. failed Rt facet injections. poss due to Rt SI joint dysfunction.  . Chronic kidney disease   . Depression   . Diastolic heart failure (HCC)   . GERD (gastroesophageal reflux disease)   . Hyperparathyroidism (HCC)   . Hypertension   . Hypothyroidism   . Migraine   . Neuromuscular disorder (HCC)   . Sleep apnea   . Syncope 08/30/2015  . Tachycardia   . Trochanteric bursitis of both hips 2012   Confirmed on MRI    Past Surgical History:  Procedure Laterality Date  . ABDOMINAL HYSTERECTOMY    . APPENDECTOMY  1986  . BACK SURGERY     L4-5 fusion  . CATARACT EXTRACTION    . KNEE SURGERY      Social History   Tobacco Use  . Smoking status: Never Smoker  .  Smokeless tobacco: Never Used  Substance Use Topics  . Alcohol use: No    Family History  Problem Relation Age of Onset  . Diabetes Mother   . Heart disease Mother   . Kidney disease Mother   . Thyroid disease Mother   . Heart disease Father   .  Seizures Father   . COPD Father   . Irritable bowel syndrome Father   . Cancer Maternal Grandmother   . Diabetes Sister   . Diabetes Brother   . Colon cancer Neg Hx   . Stomach cancer Neg Hx     ROS  Per hpi  Objective  Vitals as reported by the patient: none  There were no vitals filed for this visit.  ASSESSMENT and PLAN  1. Insomnia due to other mental disorder 2. Vertigo - meclizine (ANTIVERT) 25 MG tablet; TAKE 1 TABLET BY MOUTH 3 TIMES DAILY AS NEEDED FOR DIZZINESS 3. PTSD (post-traumatic stress disorder) 4. Chronic migraine without aura without status migrainosus, not intractable 5. Other specified hypothyroidism 6. Encounter for long-term (current) use of medications 7. History of recent fall  Patient is overall stable on current regime. Reviewed medications r/se/b. Reports tolerates current regime well, psych sx and migraines adequately controlled. Recent fall very mechanical. Discussed ER precautions.   Other orders - busPIRone (BUSPAR) 5 MG tablet; Take 1 tablet (5 mg total) by mouth 3 (three) times daily. May take 4th dose in evening for anxiety exacerbation - diclofenac sodium (VOLTAREN) 1 % GEL; Apply 2 g topically daily as needed (for knee pain). - DULoxetine (CYMBALTA) 60 MG capsule; Take 1 capsule (60 mg total) by mouth daily. - esomeprazole (NEXIUM) 40 MG capsule; TAKE 1 CAPSULE EVERY DAY - fluticasone (FLONASE) 50 MCG/ACT nasal spray; PLACE 2 SPRAYS AT BEDTIME INTO BOTH NOSTRILS. - gabapentin (NEURONTIN) 600 MG tablet; Take 1 tablet (600 mg total) by mouth 3 (three) times daily. - hydrOXYzine (ATARAX/VISTARIL) 50 MG tablet; Take 1 tablet (50 mg total) by mouth every 6 (six) hours as needed. - levothyroxine  (SYNTHROID, LEVOTHROID) 50 MCG tablet; Take 1 tablet (50 mcg total) by mouth daily before breakfast. - QUEtiapine (SEROQUEL) 50 MG tablet; Take 1 tablet (50 mg total) by mouth at bedtime as needed.  FOLLOW-UP: 2 months for hypothyroidism and prediabetes labs   The above assessment and management plan was discussed with the patient. The patient verbalized understanding of and has agreed to the management plan. Patient is aware to call the clinic if symptoms persist or worsen. Patient is aware when to return to the clinic for a follow-up visit. Patient educated on when it is appropriate to go to the emergency department.    I provided 21 minutes of non-face-to-face time during this encounter.  Myles Lipps, MD Primary Care at [facility] [address] [place], [place] [address] Ph.  [phone] Fax [phone]

## 2018-08-23 NOTE — Progress Notes (Signed)
Needs refills sent through [facility], asking for 90 day supplies on tessalon,

## 2018-08-28 ENCOUNTER — Other Ambulatory Visit: Payer: Self-pay | Admitting: Family Medicine

## 2018-09-07 ENCOUNTER — Ambulatory Visit
Admission: RE | Admit: 2018-09-07 | Discharge: 2018-09-07 | Disposition: A | Discharge status: Home / Self Care | Payer: Medicare HMO | Source: Ambulatory Visit | Attending: Family Medicine | Admitting: Family Medicine

## 2018-09-07 ENCOUNTER — Encounter: Payer: Self-pay | Admitting: Family Medicine

## 2018-09-07 ENCOUNTER — Ambulatory Visit (INDEPENDENT_AMBULATORY_CARE_PROVIDER_SITE_OTHER): Payer: Medicare HMO | Admitting: Family Medicine

## 2018-09-07 ENCOUNTER — Other Ambulatory Visit: Payer: Self-pay

## 2018-09-07 VITALS — BP 157/93 | HR 82 | Temp 98.3°F | Ht 62.0 in | Wt 215.8 lb

## 2018-09-07 DIAGNOSIS — Y92009 Unspecified place in unspecified non-institutional (private) residence as the place of occurrence of the external cause: Secondary | ICD-10-CM

## 2018-09-07 DIAGNOSIS — S0990XA Unspecified injury of head, initial encounter: Secondary | ICD-10-CM

## 2018-09-07 DIAGNOSIS — R42 Dizziness and giddiness: Secondary | ICD-10-CM | POA: Diagnosis not present

## 2018-09-07 DIAGNOSIS — W19XXXA Unspecified fall, initial encounter: Secondary | ICD-10-CM

## 2018-09-07 NOTE — Patient Instructions (Addendum)
  [address] Please go straight there for CT. CT scan -head stat -will notify results when availabe  If you have lab work done today you will be contacted with your lab results within the next 2 weeks.  If you have not heard from [place] then please contact us. The fastest way to get your results is to register for My Chart.   IF you received an x-ray today, you will receive an invoice from [facility]. Please contact [facility] at [phone] with questions or concerns regarding your invoice.   IF you received labwork today, you will receive an invoice from [facility]. Please contact [facility] at [phone] with questions or concerns regarding your invoice.   Our billing staff will not be able to assist you with questions regarding bills from these companies.  You will be contacted with the lab results as soon as they are available. The fastest way to get your results is to activate your My Chart account. Instructions are located on the last page of this paperwork. If you have not heard from [place] regarding the results in 2 weeks, please contact this office.

## 2018-09-07 NOTE — Progress Notes (Signed)
Acute Office Visit  Subjective:    Patient ID: Jennifer Davidson, female    DOB: 26-Oct-1955, 63 y.o.   MRN: [id number]  Chief Complaint  Patient presents with  . Fall    3 weeks   . Depression    PHQ9= 16 Hx of depression    HPI Patient is here today for falling 3 weeks ago-continued swelling above the left eye. Pt states she was walking in her home and fell fitting her head directly over the left eye. Pt states no loc but nausea.  Pt states no vomiting. Pt did not extend hand as her left hand is in a sling awaiting rotator cuff surgery. Pt states she had previously hit her head in Feb when visiting her daughter she fell over railroad ties and hit the same side of her head. She was not evaluated and daughter who is a nurse observed and applied ice with swelling noted. NO LOC during that fall.  Pt states she has periods of dizziness often.pt uses antivert otc. Pt has a h/o back pain with surgery and injections. She states she no longer has numbness in the legs and no longer needs to use a cane to assist with walking.   Past Medical History:  Diagnosis Date  . Acute encephalopathy 06/25/2016  . Anxiety   . Cataract 08/31/2017   left eye  . Cholesterol serum increased   . Chronic back pain    chronic Rt low back pain. s/p L4-5 fusion. failed Rt facet injections. poss due to Rt SI joint dysfunction.  . Chronic kidney disease   . Depression   . Diastolic heart failure (HCC)   . GERD (gastroesophageal reflux disease)   . Hyperparathyroidism (HCC)   . Hypertension   . Hypothyroidism   . Migraine   . Neuromuscular disorder (HCC)   . Sleep apnea   . Syncope 08/30/2015  . Tachycardia   . Trochanteric bursitis of both hips 2012   Confirmed on MRI    Past Surgical History:  Procedure Laterality Date  . ABDOMINAL HYSTERECTOMY    . APPENDECTOMY  1986  . BACK SURGERY     L4-5 fusion  . CATARACT EXTRACTION    . KNEE SURGERY      Family History  Problem Relation Age of Onset  . Diabetes  Mother   . Heart disease Mother   . Kidney disease Mother   . Thyroid disease Mother   . Heart disease Father   . Seizures Father   . COPD Father   . Irritable bowel syndrome Father   . Cancer Maternal Grandmother   . Diabetes Sister   . Diabetes Brother   . Colon cancer Neg Hx   . Stomach cancer Neg Hx     Social History   Socioeconomic History  . Marital status: Married    Spouse name: Not on file  . Number of children: 3  . Years of education: 74  . Highest education level: Some college, no degree  Occupational History  . Not on file  Social Needs  . Financial resource strain: Not hard at all  . Food insecurity:    Worry: Never true    Inability: Never true  . Transportation needs:    Medical: No    Non-medical: No  Tobacco Use  . Smoking status: Never Smoker  . Smokeless tobacco: Never Used  Substance and Sexual Activity  . Alcohol use: No  . Drug use: No    Comment: 08-31-2016  PER PT NO   . Sexual activity: Yes    Partners: Male    Comment: married  Lifestyle  . Physical activity:    Days per week: 0 days    Minutes per session: 0 min  . Stress: Very much  Relationships  . Social connections:    Talks on phone: More than three times a week    Gets together: More than three times a week    Attends religious service: Never    Active member of club or organization: No    Attends meetings of clubs or organizations: Never    Relationship status: Married  . Intimate partner violence:    Fear of current or ex partner: No    Emotionally abused: No    Physically abused: No    Forced sexual activity: No  Other Topics Concern  . Not on file  Social History Narrative  . Not on file    Outpatient Medications Prior to Visit  Medication Sig Dispense Refill  . busPIRone (BUSPAR) 5 MG tablet Take 1 tablet (5 mg total) by mouth 3 (three) times daily. May take 4th dose in evening for anxiety exacerbation 360 tablet 1  . [facility] MELATONIN 5 MG TABS TAKE 1 CAPSULE (5  MG TOTAL) BY MOUTH AT BEDTIME. 120 tablet 0  . diclofenac sodium (VOLTAREN) 1 % GEL Apply 2 g topically daily as needed (for knee pain). 100 g 3  . DULoxetine (CYMBALTA) 60 MG capsule Take 1 capsule (60 mg total) by mouth daily. 90 capsule 1  . esomeprazole (NEXIUM) 40 MG capsule TAKE 1 CAPSULE EVERY DAY 90 capsule 1  . fluticasone (FLONASE) 50 MCG/ACT nasal spray PLACE 2 SPRAYS AT BEDTIME INTO BOTH NOSTRILS. 16 g 5  . gabapentin (NEURONTIN) 600 MG tablet Take 1 tablet (600 mg total) by mouth 3 (three) times daily. 270 tablet 1  . hydrOXYzine (ATARAX/VISTARIL) 50 MG tablet Take 1 tablet (50 mg total) by mouth every 6 (six) hours as needed. 360 tablet 0  . levothyroxine (SYNTHROID, LEVOTHROID) 50 MCG tablet Take 1 tablet (50 mcg total) by mouth daily before breakfast. 90 tablet 1  . meclizine (ANTIVERT) 25 MG tablet TAKE 1 TABLET BY MOUTH 3 TIMES DAILY AS NEEDED FOR DIZZINESS 30 tablet 0  . ondansetron (ZOFRAN-ODT) 8 MG disintegrating tablet Take 0.5-1 tablets (4-8 mg total) by mouth every 8 (eight) hours as needed for nausea (from migraines). 20 tablet 5  . prazosin (MINIPRESS) 2 MG capsule TAKE 2 TABLETS BY MOUTH ALONG WITH THE 5MG  TABLET TO MAKE A TOTAL OF 9MG  DOSE AT BEDTIME 180 capsule 0  . prazosin (MINIPRESS) 5 MG capsule Take 2 capsules (10 mg total) by mouth at bedtime. TAKE 2 TO 3 CAPSULES AT BEDTIME 180 capsule 1  . propranolol (INDERAL) 20 MG tablet TAKE 1-2 TABLETS BY MOUTH AT BEDTIME. 180 tablet 0  . QUEtiapine (SEROQUEL) 300 MG tablet TAKE 1 TABLET BY MOUTH EVERYDAY AT BEDTIME 90 tablet 1  . QUEtiapine (SEROQUEL) 50 MG tablet Take 1 tablet (50 mg total) by mouth at bedtime as needed. 90 tablet 0  . rizatriptan (MAXALT-MLT) 10 MG disintegrating tablet Take 1 tablet (10 mg total) by mouth as needed for migraine. May repeat in 2 hours if needed 30 tablet 3  . hydrOXYzine (VISTARIL) 50 MG capsule Take 1 tab with supper, 1 tab po qhs, and may repeat 1 tab po x 1 if awakens 270 capsule 1    No facility-administered medications prior to visit.  Allergies  Allergen Reactions  . Tramadol Nausea And Vomiting  . Trazodone And Nefazodone Other (See Comments)    Per pt trazodone caused hallucinations and behavior changes     ROS CONSTITUTIONAL: no fever EENT:no  sinus problems or nasal congestion,pt with bruising-yellow/green brusining above the left eyelid and below the left eye The area as decreased in size from a plum size area to a grape size area per pt Tenderness to touch   CV: no chest pain  RESP: no SOB, coug GI: no heartburn MS: left shoulder pain,-pt has sling in place NEURO intermittent headaches, blurred vision-left eye intermittently, dizziness-intermittently, NO loss of consciousness, no episodes of visual loss PSY: depression, anxiety      Objective:    Physical Exam  Constitutional: She is oriented to person, place, and time. She appears well-developed and well-nourished. No distress.  Eyes: Pupils are equal, round, and reactive to light. Conjunctivae and EOM are normal. Right eye exhibits no discharge. Left eye exhibits no discharge.  Neck: Normal range of motion. Neck supple.  Cardiovascular: Normal rate.  Pulmonary/Chest: Effort normal and breath sounds normal.  Neurological: She is alert and oriented to person, place, and time.   Ecchymosis noted superior to the eye and inferior to the eye-left. 2cm hematoma above the left eye brow. Tenderness to palpation. No pain peri orbital M/S-left arm in splint BP (!) 157/93 (BP Location: Right Arm, Patient Position: Sitting, Cuff Size: Normal)   Pulse 82   Temp 98.3 F (36.8 C) (Oral)   Ht 5\' 2"  (1.575 m)   Wt 215 lb 12.8 oz (97.9 kg)   SpO2 98%   BMI 39.47 kg/m  Wt Readings from Last 3 Encounters:  09/07/18 215 lb 12.8 oz (97.9 kg)  05/22/18 202 lb 9.6 oz (91.9 kg)  03/29/18 203 lb (92.1 kg)   Recheck right arm sitting 122/82 (after pt seating for greater than 10 minutes)   Lab Results   Component Value Date   TSH 2.490 03/29/2018   Lab Results  Component Value Date   WBC 9.3 05/22/2018   HGB 12.9 05/22/2018   HCT 38.0 05/22/2018   MCV 76.5 05/22/2018   PLT 279 03/29/2018   Lab Results  Component Value Date   NA 140 03/29/2018   K 4.6 03/29/2018   CO2 25 03/29/2018   GLUCOSE 112 (H) 03/29/2018   BUN 10 03/29/2018   CREATININE 1.28 (H) 03/29/2018   BILITOT 0.4 03/29/2018   ALKPHOS 83 03/29/2018   AST 16 03/29/2018   ALT 12 03/29/2018   PROT 6.5 03/29/2018   ALBUMIN 4.3 03/29/2018   CALCIUM 9.6 03/29/2018   ANIONGAP 13 07/04/2017   Lab Results  Component Value Date   CHOL 263 (H) 03/29/2018   Lab Results  Component Value Date   HDL 51 03/29/2018   Lab Results  Component Value Date   LDLCALC 156 (H) 03/29/2018   Lab Results  Component Value Date   TRIG 281 (H) 03/29/2018   Lab Results  Component Value Date   CHOLHDL 5.2 (H) 03/29/2018   Lab Results  Component Value Date   HGBA1C 5.6 03/29/2018       Assessment & Plan:   Injury of head, initial encounter - Plan: CT Head Wo Contrast  Fall as cause of accidental injury in home as place of occurrence, initial encounter - Plan: CT Head Wo Contrast d/w pt at length concerns about medications and risk of falls and dizziness-pt states she has been on medication  a long time and understands the risk of dizziness -pt on multiple medications in the past from pain management that caused pt to be admitted to psy unit per pt with extreme difficulty getting off of medications due to back pain and depression concerns  Pt agreed to CT - discussion of concerns  about concussion associated with 2 falls with less than 3 months.  Pt not working at this time and no longer driving. Husband driving pt and support system. NO SI   LISA Mat Carne, MD

## 2018-09-08 ENCOUNTER — Telehealth: Payer: Self-pay

## 2018-09-08 DIAGNOSIS — Y92009 Unspecified place in unspecified non-institutional (private) residence as the place of occurrence of the external cause: Secondary | ICD-10-CM

## 2018-09-08 DIAGNOSIS — S0990XA Unspecified injury of head, initial encounter: Secondary | ICD-10-CM

## 2018-09-08 DIAGNOSIS — W19XXXA Unspecified fall, initial encounter: Secondary | ICD-10-CM | POA: Insufficient documentation

## 2018-09-08 HISTORY — DX: Unspecified place in unspecified non-institutional (private) residence as the place of occurrence of the external cause: W19.XXXA

## 2018-09-08 HISTORY — DX: Unspecified injury of head, initial encounter: S09.90XA

## 2018-09-08 NOTE — Telephone Encounter (Signed)
Called pt and informed her that her Head CT did  Not show any signs of bleeding Per provider request. Pt stated understanding.

## 2018-09-08 NOTE — Progress Notes (Signed)
Your CT shows no sign of a bleed. A hematoma over the left eye as expected was evident.

## 2018-09-18 ENCOUNTER — Ambulatory Visit: Payer: Self-pay

## 2018-09-18 NOTE — Telephone Encounter (Signed)
Pt. Called to report chronic cough since January.  Also c/o feeling very tired, and intermittent dizziness.   Reported she coughs so hard, she vomits.  Coughing up "dark beige" mucus, at times. C/o sinus pressure across the forehead; nasal drainage is "dark gray."  Stated headache comes and goes.  Denied fever, shortness of breath, wheezing, chest tightness, or loss of taste/ smell. Reported she has also had diarrhea off and on since the cough started.  Stated she has had diarrhea stools x 3 in past 24 hrs.  Pt. Stated she has had periods that she feels she is getting better, then starts to feel worse again.  Has not been taking anything OTC for cough.  Reported she stopped using Mucinex, due to side effect of dizziness.  Denied known exposure to individual poss. for COVID 19.  Denied any travel outside her community in past 14 days.  Called [facility]; transferred pt. to office to be scheduled for a Virtual appt.  Pt. Agreed with plan.      Reason for Disposition . Cough present > 3 weeks    Reported has had cough since January that has become a lot worse recently; stated she coughs so hard, it makes her vomit.  Denied fever, chest tightness, or shortness of breath.  Answer Assessment - Initial Assessment Questions 1. COVID-19 DIAGNOSIS: "Who made your Coronavirus (COVID-19) diagnosis?" "Was it confirmed by a positive lab test?" If not diagnosed by a HCP, ask "Are there lots of cases (community spread) where you live?" (See public health department website, if unsure)   * MAJOR community spread: high number of cases; numbers of cases are increasing; many people hospitalized.   * MINOR community spread: low number of cases; not increasing; few or no people hospitalized     Present within community 2. ONSET: "When did the COVID-19 symptoms start?"      Off and on since January 3. WORST SYMPTOM: "What is your worst symptom?" (e.g., cough, fever, shortness of breath, muscle aches)     Cough 4. COUGH: "Do you  have a cough?" If so, ask: "How bad is the cough?"       Frequent; productive of dark beige mucus 5. FEVER: "Do you have a fever?" If so, ask: "What is your temperature, how was it measured, and when did it start?"     Denied  6. RESPIRATORY STATUS: "Describe your breathing?" (e.g., shortness of breath, wheezing, unable to speak)     Denied wheezing, shortness of breath, or chest tightness  7. BETTER-SAME-WORSE: "Are you getting better, staying the same or getting worse compared to yesterday?"  If getting worse, ask, "In what way?"     Feeling worse; has had some improvement then returns to previous ill feeling  8. HIGH RISK DISEASE: "Do you have any chronic medical problems?" (e.g., asthma, heart or lung disease, weak immune system, etc.)     Denied asthma; has CPAP, denied DM; denied weak immune system 9. PREGNANCY: "Is there any chance you are pregnant?" "When was your last menstrual period?"     N/a  10. OTHER SYMPTOMS: "Do you have any other symptoms?"  (e.g., runny nose, headache, sore throat, loss of smell)       Denied loss of sense smell / taste; headache comes and goes; sinus drainage for a couple weeks; nasal drainage is dark gray.  Protocols used: CORONAVIRUS (COVID-19) DIAGNOSED OR SUSPECTED-A-AH

## 2018-09-19 ENCOUNTER — Telehealth (INDEPENDENT_AMBULATORY_CARE_PROVIDER_SITE_OTHER): Payer: Medicare HMO | Admitting: Family Medicine

## 2018-09-19 ENCOUNTER — Other Ambulatory Visit: Payer: Self-pay

## 2018-09-19 DIAGNOSIS — R112 Nausea with vomiting, unspecified: Secondary | ICD-10-CM | POA: Diagnosis not present

## 2018-09-19 DIAGNOSIS — R05 Cough: Secondary | ICD-10-CM

## 2018-09-19 DIAGNOSIS — R42 Dizziness and giddiness: Secondary | ICD-10-CM | POA: Diagnosis not present

## 2018-09-19 DIAGNOSIS — R197 Diarrhea, unspecified: Secondary | ICD-10-CM | POA: Diagnosis not present

## 2018-09-19 DIAGNOSIS — R059 Cough, unspecified: Secondary | ICD-10-CM

## 2018-09-19 DIAGNOSIS — J329 Chronic sinusitis, unspecified: Secondary | ICD-10-CM

## 2018-09-19 MED ORDER — DOXYCYCLINE HYCLATE 100 MG PO TABS: 100.0000 mg | ORAL_TABLET | Freq: Two times a day (BID) | ORAL | 0 refills | Status: DC

## 2018-09-19 MED ORDER — BENZONATATE 100 MG PO CAPS: 100.0000 mg | ORAL_CAPSULE | Freq: Three times a day (TID) | ORAL | 0 refills | Status: DC | PRN

## 2018-09-19 NOTE — Progress Notes (Signed)
Virtual Visit via Telephone Note  10min chart review.   I connected with Jennifer Davidson on 09/19/18 at 11:09 AM by telephone and verified that I am speaking with the correct person using two identifiers.   I discussed the limitations, risks, security and privacy concerns of performing an evaluation and management service by telephone and the availability of in person appointments. I also discussed with the patient that there may be a patient responsible charge related to this service. The patient expressed understanding and agreed to proceed, consent obtained  Chief complaint:  Cough  History of Present Illness: Jennifer Davidson is a 63 y.o. female ZOX:[id number]:Santiago, Meda CoffeeIrma M, MD next appointment June 11.  Cough: Noted more in January, cleared up for awhile then returned. In January thought to have viral illness. CXR without active disease. No recent abx. Treated with tessalon perles - helped cough. Back and forth since that time, but worse cough past week. Productive cough off and on, more cough and nausea past week. Yellow mucus - same as in past few months. Vomited all day 3 days ago, none in past 2 days. Diarrhea - 1 week ago. 3-4 times per day - about the same. No blood in stool.  No known fever, but has had chills.  Some bodyaches, some headache with current cough past week. - cough worse 3 days ago.  Few episodes of dyspnea 3 days ago, not now.  Sinus pain and pressure off and on for months. Discolored nasal d/c for months - yellow.  Drinking powerade, water.  No sick contacts at home or known exposure to covid 19. Tx: no current meds. Ran out of tessalon a month ago.   Dizziness: Seen April 23 after a fall and dizziness. Head CT obtained April 23: No acute intracranial findings, left supraorbital soft tissue swelling without evidence of acute globe injury and partially opacified posterior ethmoid air cells on the left without air-fluid levels or definite facial fracture. Medication  risks were discussed at that visit with use of chronic medication-Seroquel, gabapentin, hydroxyzine.  Has used meclizine for episodic dizziness/vertigo. Mechanical fall wen discussed with PCP 08/23/18.   More dizzy on Saturday - fall last week, not in past few days. No injury with fall last week - lost balance.  Taking meclizine 3 times per day. Less dizzy past 2 days. Drinking fluids.  No chest pains or palpitations.  Home BP readings 135/70.    Lab Results  Component Value Date   WBC 9.3 05/22/2018   HGB 12.9 05/22/2018   HCT 38.0 05/22/2018   MCV 76.5 05/22/2018   PLT 279 03/29/2018  normal iron panel in December.  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   Seizure-like activity (HCC) 09/14/2016   Moderate episode of recurrent major depressive disorder (HCC) 08/31/2016   PTSD (post-traumatic stress disorder) 08/31/2016   Recurrent UTI 06/25/2016   Severe recurrent depression with psychosis (HCC) 06/25/2016  GERD (gastroesophageal reflux disease) 11/13/2015   Generalized anxiety disorder 06/14/2015   Chronic pain of right lower extremity 05/14/2015   Neuropathy of lower extremity 05/14/2015   Headache, migraine 01/17/2015   Severe recurrent major depressive disorder with psychotic features (HCC)    Major depression, recurrent, chronic (HCC) 12/02/2014   Mood disorder (HCC) 12/01/2014   Chronic back pain      Hyperparathyroidism, 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   Neuromuscular disorder (HCC) 07/31/2014   Lower back pain 09/21/2013   Diastolic heart failure (HCC) 03/17/2012   Syncope and collapse 02/12/2012   Chronic kidney disease 02/04/2012   Edema 02/04/2012   Tachycardia 02/04/2012   Encounter for long-term (current) use of medications 02/04/2012   Anxiety and depression 02/04/2012   Hypothyroid 02/04/2012   Hyperlipidemia 02/04/2012   Past Medical History:  Diagnosis Date   Acute encephalopathy 06/25/2016   Anxiety    Cataract 08/31/2017   left eye   Cholesterol serum increased    Chronic back pain    chronic Rt low back pain. s/p L4-5 fusion. failed Rt facet injections. poss due to Rt SI joint dysfunction.   Chronic kidney disease    Depression    Diastolic heart failure (HCC)    GERD (gastroesophageal reflux disease)    Hyperparathyroidism (HCC)    Hypertension    Hypothyroidism    Migraine    Neuromuscular disorder (HCC)    Sleep apnea    Syncope 08/30/2015   Tachycardia    Trochanteric bursitis of both hips 2012   Confirmed on MRI   Past Surgical History:  Procedure Laterality Date   ABDOMINAL HYSTERECTOMY     APPENDECTOMY  1986   BACK SURGERY     L4-5 fusion   CATARACT EXTRACTION     KNEE SURGERY     Allergies  Allergen Reactions   Tramadol Nausea And Vomiting   Trazodone And Nefazodone Other (See Comments)    Per pt trazodone caused hallucinations and behavior changes    Prior to Admission medications   Medication Sig Start Date End Date Taking? Authorizing Provider  busPIRone (BUSPAR) 5 MG tablet Take 1 tablet (5 mg total) by mouth 3 (three) times daily. May take 4th dose in evening for anxiety exacerbation 08/23/18  Yes Myles Lipps, MD  [facility] MELATONIN 5 MG TABS TAKE 1 CAPSULE (5 MG TOTAL) BY MOUTH AT BEDTIME. 01/31/18  Yes [name], MD  diclofenac sodium (VOLTAREN) 1 % GEL Apply 2 g topically daily as needed  (for knee pain). 08/23/18  Yes Myles Lipps, MD  DULoxetine (CYMBALTA) 60 MG capsule Take 1 capsule (60 mg total) by mouth daily. 08/23/18  Yes Myles Lipps, MD  esomeprazole (NEXIUM) 40 MG capsule TAKE 1 CAPSULE EVERY DAY 08/23/18  Yes Myles Lipps, MD  fluticasone [facility]) 50 MCG/ACT nasal spray PLACE 2 SPRAYS AT BEDTIME INTO BOTH NOSTRILS. 08/23/18  Yes Myles Lipps, MD  gabapentin (NEURONTIN) 600 MG tablet Take 1 tablet (600 mg total) by mouth 3 (three) times daily. 08/23/18  Yes Myles Lipps, MD  hydrOXYzine (ATARAX/VISTARIL) 50 MG tablet Take 1 tablet (50 mg total) by mouth every 6 (six) hours as needed. 08/23/18 11/17/18 Yes Myles Lipps, MD  levothyroxine (SYNTHROID, LEVOTHROID) 50 MCG tablet Take 1 tablet (50 mcg total) by mouth daily before breakfast. 08/23/18  Yes Myles Lipps, MD  meclizine (ANTIVERT) 25 MG tablet TAKE 1 TABLET BY MOUTH 3 TIMES DAILY AS NEEDED FOR DIZZINESS 08/23/18  Yes  Myles Lipps, MD  ondansetron (ZOFRAN-ODT) 8 MG disintegrating tablet Take 0.5-1 tablets (4-8 mg total) by mouth every 8 (eight) hours as needed for nausea (from migraines). 03/29/18  Yes [name], MD  prazosin (MINIPRESS) 2 MG capsule TAKE 2 TABLETS BY MOUTH ALONG WITH THE  TABLET TO MAKE A TOTAL OF  DOSE AT BEDTIME 06/22/18  Yes Myles Lipps, MD  prazosin (MINIPRESS) 5 MG capsule Take 2 capsules (10 mg total) by mouth at bedtime. TAKE 2 TO 3 CAPSULES AT BEDTIME 03/29/18  Yes [name], MD  propranolol (INDERAL) 20 MG tablet TAKE 1-2 TABLETS BY MOUTH AT BEDTIME. 06/05/18  Yes [name], MD  QUEtiapine (SEROQUEL) 300 MG tablet TAKE 1 TABLET BY MOUTH EVERYDAY AT BEDTIME 07/04/18  Yes Stallings, Zoe A, MD  QUEtiapine (SEROQUEL) 50 MG tablet Take 1 tablet (50 mg total) by mouth at bedtime as needed. 08/23/18  Yes Myles Lipps, MD  rizatriptan (MAXALT-MLT) 10 MG disintegrating tablet Take 1 tablet (10 mg total) by mouth as needed for migraine. May repeat in 2 hours if needed  10/14/17  Yes [name], MD   Social History   Socioeconomic History   Marital status: Married    Spouse name: Not on file   Number of children: 3   Years of education: 12   Highest education level: Some college, no degree  Occupational History   Not on file  Social Needs   Financial resource strain: Not hard at all   Food insecurity:    Worry: Never true    Inability: Never true   Transportation needs:    Medical: No    Non-medical: No  Tobacco Use   Smoking status: Never Smoker   Smokeless tobacco: Never Used  Substance and Sexual Activity   Alcohol use: No   Drug use: No    Comment: 08-31-2016 PER PT NO    Sexual activity: Yes    Partners: Male    Comment: married  Lifestyle   Physical activity:    Days per week: 0 days    Minutes per session: 0 min   Stress: Very much  Relationships   Social connections:    Talks on phone: More than three times a week    Gets together: More than three times a week    Attends religious service: Never    Active member of club or organization: No    Attends meetings of clubs or organizations: Never    Relationship status: Married   Intimate partner violence:    Fear of current or ex partner: No    Emotionally abused: No    Physically abused: No    Forced sexual activity: No  Other Topics Concern   Not on file  Social History Narrative   Not on file     Observations/Objective: No distress on phone, appropriate responses, speaking in full sentences without apparent dyspnea.  Assessment and Plan: Cough - Plan: doxycycline (VIBRA-TABS) 100 MG tablet, benzonatate (TESSALON) 100 MG capsule Sinusitis, unspecified chronicity, unspecified location - Plan: doxycycline (VIBRA-TABS) 100 MG tablet Diarrhea, unspecified type Nausea and vomiting, intractability of vomiting not specified, unspecified vomiting type  -Longstanding recurrent cough since January, episodic improvement no worsening, but notes some  worsening of both sinuses and cough in the past week.  Secondary sinusitis/bronchitis possible versus secondary illness/viral illness with recent nausea and vomiting including COVID19.  Vomiting has resolved, still some diarrhea 3 times per day.  Denies significant dyspnea  at this time, mentating appropriately, and tolerating p.o. fluids.  -For persistent cough and sinus symptoms, ultimately decided to try doxycycline although potential worsening of diarrhea with antibiotics was discussed.  -Restart Tessalon Perles  -Follow-up telemedicine visit in 3 days, sooner or ER if acute worsening of symptoms with ER precautions given.  Additionally isolation was discussed given possible COVID-19.   Dizziness  -Possibly multifactorial with some of her chronic medications, recent illness with vomiting and diarrhea on Saturday, but less dizzy days.  -Increase fluid intake, recheck 3 days.  May need follow-up with primary care provider to review medications further if persistent dizziness with history of falls.  ER precautions given if acute changes  Follow Up Instructions:    3 days.   Patient Instructions   Based on prior cough and sinus symptoms with some worsening, there may be a component of the bacterial infection.  Can start doxycycline, but as we discussed that could potentially worsen your diarrhea.  If that is the case, may need to stop antibiotics.  Try using saline nasal spray 3-4 times per day for sinus congestion, make sure to drink plenty of fluids.  Tessalon Perles as needed for cough.  See information below on diarrhea, but with combination of symptoms with worsening cough, diarrhea, body aches, and chills, COVID 19 infection would be possible.  For that reason I would recommend isolating as much as possible.  Make sure to drink plenty of fluids, Tylenol if needed for body aches or fever.  If any worsening dizziness, unable to tolerate fluid intake, shortness of breath at rest, confusion, or  acute worsening of symptoms present to the emergency room.  Follow-up in 3 days by telemedicine.   Return to the clinic or go to the nearest emergency room if any of your symptoms worsen or new symptoms occur.     If you have lab work done today you will be contacted with your lab results within the next 2 weeks.  If you have not heard from [place] then please contact us. The fastest way to get your results is to register for My Chart.   IF you received an x-ray today, you will receive an invoice from [facility]. Please contact [facility] at [phone] with questions or concerns regarding your invoice.   IF you received labwork today, you will receive an invoice from [place]. Please contact [facility] at [phone] with questions or concerns regarding your invoice.   Our billing staff will not be able to assist you with questions regarding bills from these companies.  You will be contacted with the lab results as soon as they are available. The fastest way to get your results is to activate your My Chart account. Instructions are located on the last page of this paperwork. If you have not heard from [place] regarding the results in 2 weeks, please contact this office.        I discussed the assessment and treatment plan with the patient. The patient was provided an opportunity to ask questions and all were answered. The patient agreed with the plan and demonstrated an understanding of the instructions.   The patient was advised to call back or seek an in-person evaluation if the symptoms worsen or if the condition fails to improve as anticipated.  I provided 18 minutes of non-face-to-face time during this encounter. 10 min chart review.   Signed,   Meredith Staggers, MD [facility].  09/19/18

## 2018-09-19 NOTE — Patient Instructions (Addendum)
Based on prior cough and sinus symptoms with some worsening, there may be a component of the bacterial infection.  Can start doxycycline, but as we discussed that could potentially worsen your diarrhea.  If that is the case, may need to stop antibiotics.  Try using saline nasal spray 3-4 times per day for sinus congestion, make sure to drink plenty of fluids.  Tessalon Perles as needed for cough.  See information below on diarrhea, but with combination of symptoms with worsening cough, diarrhea, body aches, and chills, COVID 19 infection would be possible.  For that reason I would recommend isolating as much as possible.  Make sure to drink plenty of fluids, Tylenol if needed for body aches or fever.  If any worsening dizziness, unable to tolerate fluid intake, shortness of breath at rest, confusion, or acute worsening of symptoms present to the emergency room.  Follow-up in 3 days by telemedicine.   Return to the clinic or go to the nearest emergency room if any of your symptoms worsen or new symptoms occur.     If you have lab work done today you will be contacted with your lab results within the next 2 weeks.  If you have not heard from [place] then please contact us. The fastest way to get your results is to register for My Chart.   IF you received an x-ray today, you will receive an invoice from [facility]. Please contact [facility] at [phone] with questions or concerns regarding your invoice.   IF you received labwork today, you will receive an invoice from [place]. Please contact [facility] at [phone] with questions or concerns regarding your invoice.   Our billing staff will not be able to assist you with questions regarding bills from these companies.  You will be contacted with the lab results as soon as they are available. The fastest way to get your results is to activate your My Chart account. Instructions are located on the last page of this paperwork. If you  have not heard from [place] regarding the results in 2 weeks, please contact this office.

## 2018-09-19 NOTE — Progress Notes (Signed)
CC- cough(dark grey mucus) fatigue and sinus issus(pain and pressure in nose area cheek bone pain)since Jan off/on- Dizzy all day sat. Taken the meclizine for the dizziness.

## 2018-09-22 ENCOUNTER — Other Ambulatory Visit: Payer: Self-pay

## 2018-09-22 ENCOUNTER — Telehealth: Payer: Self-pay | Admitting: Family Medicine

## 2018-09-22 ENCOUNTER — Telehealth (INDEPENDENT_AMBULATORY_CARE_PROVIDER_SITE_OTHER): Payer: Medicare HMO | Admitting: Family Medicine

## 2018-09-22 DIAGNOSIS — R05 Cough: Secondary | ICD-10-CM | POA: Diagnosis not present

## 2018-09-22 DIAGNOSIS — R42 Dizziness and giddiness: Secondary | ICD-10-CM

## 2018-09-22 DIAGNOSIS — R112 Nausea with vomiting, unspecified: Secondary | ICD-10-CM

## 2018-09-22 DIAGNOSIS — J019 Acute sinusitis, unspecified: Secondary | ICD-10-CM

## 2018-09-22 DIAGNOSIS — R197 Diarrhea, unspecified: Secondary | ICD-10-CM | POA: Diagnosis not present

## 2018-09-22 DIAGNOSIS — R059 Cough, unspecified: Secondary | ICD-10-CM

## 2018-09-22 NOTE — Progress Notes (Signed)
Virtual Visit via Telephone Note  I connected with Jennifer Davidson on 09/22/18 at 9:06 AM by telephone and verified that I am speaking with the correct person using two identifiers.   I discussed the limitations, risks, security and privacy concerns of performing an evaluation and management service by telephone and the availability of in person appointments. I also discussed with the patient that there may be a patient responsible charge related to this service. The patient expressed understanding and agreed to proceed, consent obtained  Chief complaint: Cough,n/v, dizziness.    History of Present Illness: Jennifer Davidson is a 63 y.o. female  Cough: Follow-up from telemedicine visit 3 days ago.  See details on that visit, but in summary has had longstanding recurrent cough since January, episodic times of improvement but had some worsening of sinuses and cough in the prior week.  Treated with doxycycline for possible secondary sinusitis/bronchitis.  Did caution about worsening diarrhea as she had experienced some diarrhea at that time.  Tessalon Perles were restarted.  Has been feeling better overall.  Cough is less. Still some sinus congestion.  Tessalon perles for cough.  No fever, no dyspnea.   Nausea/vomiting/diarrhea: Nausea and vomiting had resolved at last visit, but still having some diarrhea. Had return of vomiting and diarrhea 2 days ago, better yesterday. No episodes today. Took zofran.  No fever- 98.6.  No abdominal pain, no dysuria or urinary difficulties.  Less appetite but drinking fluids.  Some HA yesterday, not today.  Still some bodyaches.   Dizziness: See last visit, thought to be multifactorial including with some of her chronic medications.  Has had some history of falls, thought to be mechanical falls.  Did advise her to increase hydration, although symptoms were improving some at last telemedicine phone call. No fall since last visit. Dizziness is better.      Patient Active Problem List   Diagnosis Date Noted  . Head injury 09/08/2018  . Fall as cause of accidental injury at home as place of occurrence 09/08/2018  . Hallucination, visual 09/14/2017  . Prediabetes 09/14/2017  . Insomnia due to other mental disorder 09/14/2017  . Dream anxiety disorder 09/14/2017  . Vivid dream 09/14/2017  . Altered mental status 06/23/2017  . Irritable bowel syndrome with diarrhea 04/25/2017  . Sacroiliac joint dysfunction of right side 03/17/2017  . 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  . Seizure-like activity (HCC) 09/14/2016  . Moderate episode of recurrent major depressive disorder (HCC) 08/31/2016  . PTSD (post-traumatic stress disorder) 08/31/2016  . Recurrent UTI 06/25/2016  . Severe recurrent depression with psychosis (HCC) 06/25/2016  . GERD (gastroesophageal reflux disease) 11/13/2015  . Generalized anxiety disorder 06/14/2015  . Chronic pain of right lower extremity 05/14/2015  . Neuropathy of lower extremity 05/14/2015  . Headache, migraine 01/17/2015  . Severe recurrent major depressive disorder with psychotic features (HCC)   . Major depression, recurrent, chronic (HCC) 12/02/2014  . Mood disorder (HCC) 12/01/2014  . Chronic back pain   . Hyperparathyroidism, 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  . Neuromuscular disorder (HCC) 07/31/2014  . Lower back pain 09/21/2013  . Diastolic heart failure (HCC) 03/17/2012  . Syncope and collapse 02/12/2012  . Chronic kidney disease 02/04/2012  . Edema 02/04/2012  . Tachycardia 02/04/2012  . Encounter for long-term (current) use of medications 02/04/2012  . Anxiety and depression 02/04/2012  . Hypothyroid 02/04/2012  . Hyperlipidemia 02/04/2012   Past Medical History:  Diagnosis Date  . Acute encephalopathy 06/25/2016  .  Anxiety   . Cataract 08/31/2017   left eye  . Cholesterol serum increased   . Chronic back pain    chronic Rt low back pain. s/p L4-5 fusion. failed Rt facet  injections. poss due to Rt SI joint dysfunction.  . Chronic kidney disease   . Depression   . Diastolic heart failure (HCC)   . GERD (gastroesophageal reflux disease)   . Hyperparathyroidism (HCC)   . Hypertension   . Hypothyroidism   . Migraine   . Neuromuscular disorder (HCC)   . Sleep apnea   . Syncope 08/30/2015  . Tachycardia   . Trochanteric bursitis of both hips 2012   Confirmed on MRI   Past Surgical History:  Procedure Laterality Date  . ABDOMINAL HYSTERECTOMY    . APPENDECTOMY  1986  . BACK SURGERY     L4-5 fusion  . CATARACT EXTRACTION    . KNEE SURGERY     Allergies  Allergen Reactions  . Tramadol Nausea And Vomiting  . Trazodone And Nefazodone Other (See Comments)    Per pt trazodone caused hallucinations and behavior changes    Prior to Admission medications   Medication Sig Start Date End Date Taking? Authorizing Provider  benzonatate (TESSALON) 100 MG capsule Take 1 capsule (100 mg total) by mouth 3 (three) times daily as needed for cough. 09/19/18  Yes Shade Flood, MD  busPIRone (BUSPAR) 5 MG tablet Take 1 tablet (5 mg total) by mouth 3 (three) times daily. May take 4th dose in evening for anxiety exacerbation 08/23/18  Yes Myles Lipps, MD  [facility] MELATONIN 5 MG TABS TAKE 1 CAPSULE (5 MG TOTAL) BY MOUTH AT BEDTIME. 01/31/18  Yes [name], MD  diclofenac sodium (VOLTAREN) 1 % GEL Apply 2 g topically daily as needed (for knee pain). 08/23/18  Yes Myles Lipps, MD  doxycycline (VIBRA-TABS) 100 MG tablet Take 1 tablet (100 mg total) by mouth 2 (two) times daily. 09/19/18  Yes Shade Flood, MD  DULoxetine (CYMBALTA) 60 MG capsule Take 1 capsule (60 mg total) by mouth daily. 08/23/18  Yes Myles Lipps, MD  esomeprazole (NEXIUM) 40 MG capsule TAKE 1 CAPSULE EVERY DAY 08/23/18  Yes Myles Lipps, MD  fluticasone [facility]) 50 MCG/ACT nasal spray PLACE 2 SPRAYS AT BEDTIME INTO BOTH NOSTRILS. 08/23/18  Yes Myles Lipps, MD  gabapentin (NEURONTIN) 600 MG  tablet Take 1 tablet (600 mg total) by mouth 3 (three) times daily. 08/23/18  Yes Myles Lipps, MD  hydrOXYzine (ATARAX/VISTARIL) 50 MG tablet Take 1 tablet (50 mg total) by mouth every 6 (six) hours as needed. 08/23/18 11/17/18 Yes Myles Lipps, MD  levothyroxine (SYNTHROID, LEVOTHROID) 50 MCG tablet Take 1 tablet (50 mcg total) by mouth daily before breakfast. 08/23/18  Yes Myles Lipps, MD  meclizine (ANTIVERT) 25 MG tablet TAKE 1 TABLET BY MOUTH 3 TIMES DAILY AS NEEDED FOR DIZZINESS 08/23/18  Yes Myles Lipps, MD  ondansetron (ZOFRAN-ODT) 8 MG disintegrating tablet Take 0.5-1 tablets (4-8 mg total) by mouth every 8 (eight) hours as needed for nausea (from migraines). 03/29/18  Yes [name], MD  prazosin (MINIPRESS) 2 MG capsule TAKE 2 TABLETS BY MOUTH ALONG WITH THE 5MG  TABLET TO MAKE A TOTAL OF 9MG  DOSE AT BEDTIME 06/22/18  Yes Myles Lipps, MD  prazosin (MINIPRESS) 5 MG capsule Take 2 capsules (10 mg total) by mouth at bedtime. TAKE 2 TO 3 CAPSULES AT BEDTIME 03/29/18  Yes Norberto Sorenson  N, MD  propranolol (INDERAL) 20 MG tablet TAKE 1-2 TABLETS BY MOUTH AT BEDTIME. 06/05/18  Yes [name], MD  QUEtiapine (SEROQUEL) 300 MG tablet TAKE 1 TABLET BY MOUTH EVERYDAY AT BEDTIME 07/04/18  Yes Stallings, Zoe A, MD  QUEtiapine (SEROQUEL) 50 MG tablet Take 1 tablet (50 mg total) by mouth at bedtime as needed. 08/23/18  Yes Myles Lipps, MD  rizatriptan (MAXALT-MLT) 10 MG disintegrating tablet Take 1 tablet (10 mg total) by mouth as needed for migraine. May repeat in 2 hours if needed 10/14/17  Yes [name], MD   Social History   Socioeconomic History  . Marital status: Married    Spouse name: Not on file  . Number of children: 3  . Years of education: 75  . Highest education level: Some college, no degree  Occupational History  . Not on file  Social Needs  . Financial resource strain: Not hard at all  . Food insecurity:    Worry: Never true    Inability: Never true  . Transportation  needs:    Medical: No    Non-medical: No  Tobacco Use  . Smoking status: Never Smoker  . Smokeless tobacco: Never Used  Substance and Sexual Activity  . Alcohol use: No  . Drug use: No    Comment: 08-31-2016 PER PT NO   . Sexual activity: Yes    Partners: Male    Comment: married  Lifestyle  . Physical activity:    Days per week: 0 days    Minutes per session: 0 min  . Stress: Very much  Relationships  . Social connections:    Talks on phone: More than three times a week    Gets together: More than three times a week    Attends religious service: Never    Active member of club or organization: No    Attends meetings of clubs or organizations: Never    Relationship status: Married  . Intimate partner violence:    Fear of current or ex partner: No    Emotionally abused: No    Physically abused: No    Forced sexual activity: No  Other Topics Concern  . Not on file  Social History Narrative  . Not on file     Observations/Objective: Speaking in full sentences, appropriate responses, coherent, no distress.  Assessment and Plan: Cough  Acute sinusitis, recurrence not specified, unspecified location  Non-intractable vomiting with nausea, unspecified vomiting type  Diarrhea, unspecified type  Dizziness  Some improvement past 2 days.  Currently in treatment for possible secondary sinusitis/bronchitis from prior cough.  Increased nausea and vomiting 2 days ago, but now improved.  Has had some headache and myalgias, differential also includes COVID-19.  As tolerating fluids, improving, will continue home treatment.  Continue doxycycline for now, but may need to reevaluate if persistent nausea/vomiting or worsening diarrhea.  Continue Tessalon Perles.  Oral rehydration therapy, and ER precautions were discussed, recheck 3 days.   Follow Up Instructions:    I discussed the assessment and treatment plan with the patient. The patient was provided an opportunity to ask  questions and all were answered. The patient agreed with the plan and demonstrated an understanding of the instructions.   The patient was advised to call back or seek an in-person evaluation if the symptoms worsen or if the condition fails to improve as anticipated.  I provided 8 minutes of non-face-to-face time during this encounter.  Signed,   Meredith Staggers, MD  Primary Care at [facility].  09/22/18

## 2018-09-22 NOTE — Telephone Encounter (Signed)
Call to pt d/t incoming call from husband.  Advised pt that we received a call from her husband who was concerned that she was quite anxious.  Pt states she was upset that she won't be able to spend the upcoming Mother's Day with her kids.  Advised pt that the caller made is sound more significant anxiety and depression.  Pt states she is unsure why he said that and was not even aware that he had called.  Said he was probably concerned bc she c/o headache to him.  Pt verbalized that things had been weird lately with people walking around in masks and that her shoulder has been hurting with her surgery needing to be postponed d/t COVID.  She states she is fine.  Advised pt that we become concerned when we receive calls but she reiterates that she is fine.  Pt denies current or recent SI/HI.  Advised to c/b to clinic or go to ED if she has increased concerns with anxiety/depression.

## 2018-09-22 NOTE — Patient Instructions (Addendum)
I am glad to hear that you are improving.  Continue Tessalon if needed for cough, okay to continue antibiotic for now but if any worsening nausea/vomiting, or diarrhea, we may need to reevaluate that.   Make sure to continue to drink plenty of fluids to minimize dizziness.  As we discussed you potentially could have the coronavirus or COVID-19 infection.  At this time I think it is reasonable to still treat symptoms at home, but if you do have increasing shortness of breath (especially at rest), confusion, increasing dizziness or trouble with tolerating fluids, or other acute worsening, would recommend evaluation in the emergency room.  Otherwise I will talk to you again on Monday.  Take care.     Diarrhea, Adult Diarrhea is frequent loose and watery bowel movements. Diarrhea can make you feel weak and cause you to become dehydrated. Dehydration can make you tired and thirsty, cause you to have a dry mouth, and decrease how often you urinate. Diarrhea typically lasts 2-3 days. However, it can last longer if it is a sign of something more serious. It is important to treat your diarrhea as told by your health care provider. Follow these instructions at home: Eating and drinking     Follow these recommendations as told by your health care provider:  Take an oral rehydration solution (ORS). This is an over-the-counter medicine that helps return your body to its normal balance of nutrients and water. It is found at pharmacies and retail stores.  Drink plenty of fluids, such as water, ice chips, diluted fruit juice, and low-calorie sports drinks. You can drink milk also, if desired.  Avoid drinking fluids that contain a lot of sugar or caffeine, such as energy drinks, sports drinks, and soda.  Eat bland, easy-to-digest foods in small amounts as you are able. These foods include bananas, applesauce, rice, lean meats, toast, and crackers.  Avoid alcohol.  Avoid spicy or fatty  foods.  Medicines  Take over-the-counter and prescription medicines only as told by your health care provider.  If you were prescribed an antibiotic medicine, take it as told by your health care provider. Do not stop using the antibiotic even if you start to feel better. General instructions   Wash your hands often using soap and water. If soap and water are not available, use a hand sanitizer. Others in the household should wash their hands as well. Hands should be washed: ? After using the toilet or changing a diaper. ? Before preparing, cooking, or serving food. ? While caring for a sick person or while visiting someone in a hospital.  Drink enough fluid to keep your urine pale yellow.  Rest at home while you recover.  Watch your condition for any changes.  Take a warm bath to relieve any burning or pain from frequent diarrhea episodes.  Keep all follow-up visits as told by your health care provider. This is important. Contact a health care provider if:  You have a fever.  Your diarrhea gets worse.  You have new symptoms.  You cannot keep fluids down.  You feel light-headed or dizzy.  You have a headache.  You have muscle cramps. Get help right away if:  You have chest pain.  You feel extremely weak or you faint.  You have bloody or black stools or stools that look like tar.  You have severe pain, cramping, or bloating in your abdomen.  You have trouble breathing or you are breathing very quickly.  Your heart is beating  very quickly.  Your skin feels cold and clammy.  You feel confused.  You have signs of dehydration, such as: ? Dark urine, very little urine, or no urine. ? Cracked lips. ? Dry mouth. ? Sunken eyes. ? Sleepiness. ? Weakness. Summary  Diarrhea is frequent loose and watery bowel movements. Diarrhea can make you feel weak and cause you to become dehydrated.  Drink enough fluids to keep your urine pale yellow.  Make sure that you  wash your hands after using the toilet. If soap and water are not available, use hand sanitizer.  Contact a health care provider if your diarrhea gets worse or you have new symptoms.  Get help right away if you have signs of dehydration. This information is not intended to replace advice given to you by your health care provider. Make sure you discuss any questions you have with your health care provider. Document Released: 04/23/2002 Document Revised: 10/07/2017 Document Reviewed: 10/07/2017 Elsevier Interactive Patient Education  2019 Elsevier Inc.  Nausea and Vomiting, Adult Nausea is the feeling that you have an upset stomach or that you are about to vomit. Vomiting is when stomach contents are thrown up and out of the mouth as a result of nausea. Vomiting can make you feel weak and cause you to become dehydrated. Dehydration can make you feel tired and thirsty, cause you to have a dry mouth, and decrease how often you urinate. Older adults and people with other diseases or a weak disease-fighting system (immune system) are at higher risk for dehydration. It is important to treat your nausea and vomiting as told by your health care provider. Follow these instructions at home: Watch your symptoms for any changes. Tell your health care provider about them. Follow these instructions to care for yourself at home. Eating and drinking      Take an oral rehydration solution (ORS). This is a drink that is sold at pharmacies and retail stores.  Drink clear fluids slowly and in small amounts as you are able. Clear fluids include water, ice chips, low-calorie sports drinks, and fruit juice that has water added (diluted fruit juice).  Eat bland, easy-to-digest foods in small amounts as you are able. These foods include bananas, applesauce, rice, lean meats, toast, and crackers.  Avoid fluids that contain a lot of sugar or caffeine, such as energy drinks, sports drinks, and soda.  Avoid  alcohol.  Avoid spicy or fatty foods. General instructions  Take over-the-counter and prescription medicines only as told by your health care provider.  Drink enough fluid to keep your urine pale yellow.  Wash your hands often using soap and water. If soap and water are not available, use hand sanitizer.  Make sure that all people in your household wash their hands well and often.  Rest at home while you recover.  Watch your condition for any changes.  Breathe slowly and deeply when you feel nauseated.  Keep all follow-up visits as told by your health care provider. This is important. Contact a health care provider if:  Your symptoms get worse.  You have new symptoms.  You have a fever.  You cannot drink fluids without vomiting.  Your nausea does not go away after 2 days.  You feel light-headed or dizzy.  You have a headache.  You have muscle cramps.  You have a rash.  You have pain while urinating. Get help right away if:  You have pain in your chest, neck, arm, or jaw.  You feel extremely  weak or you faint.  You have persistent vomiting.  You have vomit that is bright red or looks like black coffee grounds.  You have bloody or black stools or stools that look like tar.  You have a severe headache, a stiff neck, or both.  You have severe pain, cramping, or bloating in your abdomen.  You have difficulty breathing, or you are breathing very quickly.  Your heart is beating very quickly.  Your skin feels cold and clammy.  You feel confused.  You have signs of dehydration, such as: ? Dark urine, very little urine, or no urine. ? Cracked lips. ? Dry mouth. ? Sunken eyes. ? Sleepiness. ? Weakness. These symptoms may represent a serious problem that is an emergency. Do not wait to see if the symptoms will go away. Get medical help right away. Call your local emergency services (911 in the [place] Do not drive yourself to the hospital. Summary  Nausea  is the feeling that you have an upset stomach or that you are about to vomit. As nausea gets worse, it can lead to vomiting. Vomiting can make you feel weak and cause you to become dehydrated.  Follow instructions from your health care provider about eating and drinking to prevent dehydration.  Take over-the-counter and prescription medicines only as told by your health care provider.  Contact your health care provider if your symptoms get worse, or you have new symptoms.  Keep all follow-up visits as told by your health care provider. This is important. This information is not intended to replace advice given to you by your health care provider. Make sure you discuss any questions you have with your health care provider. Document Released: 05/03/2005 Document Revised: 10/11/2017 Document Reviewed: 10/11/2017 Elsevier Interactive Patient Education  2019 Elsevier Inc.     Cough, Adult  Coughing is a reflex that clears your throat and your airways. Coughing helps to heal and protect your lungs. It is normal to cough occasionally, but a cough that happens with other symptoms or lasts a long time may be a sign of a condition that needs treatment. A cough may last only 2-3 weeks (acute), or it may last longer than 8 weeks (chronic). What are the causes? Coughing is commonly caused by:  Breathing in substances that irritate your lungs.  A viral or bacterial respiratory infection.  Allergies.  Asthma.  Postnasal drip.  Smoking.  Acid backing up from the stomach into the esophagus (gastroesophageal reflux).  Certain medicines.  Chronic lung problems, including COPD (or rarely, lung cancer).  Other medical conditions such as heart failure. Follow these instructions at home: Pay attention to any changes in your symptoms. Take these actions to help with your discomfort:  Take medicines only as told by your health care provider. ? If you were prescribed an antibiotic medicine, take  it as told by your health care provider. Do not stop taking the antibiotic even if you start to feel better. ? Talk with your health care provider before you take a cough suppressant medicine.  Drink enough fluid to keep your urine clear or pale yellow.  If the air is dry, use a cold steam vaporizer or humidifier in your bedroom or your home to help loosen secretions.  Avoid anything that causes you to cough at work or at home.  If your cough is worse at night, try sleeping in a semi-upright position.  Avoid cigarette smoke. If you smoke, quit smoking. If you need help quitting, ask your  health care provider.  Avoid caffeine.  Avoid alcohol.  Rest as needed. Contact a health care provider if:  You have new symptoms.  You cough up pus.  Your cough does not get better after 2-3 weeks, or your cough gets worse.  You cannot control your cough with suppressant medicines and you are losing sleep.  You develop pain that is getting worse or pain that is not controlled with pain medicines.  You have a fever.  You have unexplained weight loss.  You have night sweats. Get help right away if:  You cough up blood.  You have difficulty breathing.  Your heartbeat is very fast. This information is not intended to replace advice given to you by your health care provider. Make sure you discuss any questions you have with your health care provider. Document Released: 10/30/2010 Document Revised: 10/09/2015 Document Reviewed: 07/10/2014 Elsevier Interactive Patient Education  [employer]2019 Elsevier Inc.  This information is directly available on the CDC website: DiscoHelp.sihttps://www.cdc.gov/coronavirus/2019-ncov/if-you-are-sick/steps-when-sick.html    Source:CDC Reference to specific commercial products, manufacturers, companies, or trademarks does not constitute its endorsement or recommendation by the U.S. Government, Department of Health and [employer], or Centers for [employer]isease Control and  Prevention.    If you have lab work done today you will be contacted with your lab results within the next 2 weeks.  If you have not heard from us then please contact us. The fastest way to get your results is to register for My Chart.   IF you received an x-ray today, you will receive an invoice from [facility]. Please contact [facility] at [phone] with questions or concerns regarding your invoice.   IF you received labwork today, you will receive an invoice from [facility]. Please contact [facility] at [phone] with questions or concerns regarding your invoice.   Our billing staff will not be able to assist you with questions regarding bills from these companies.  You will be contacted with the lab results as soon as they are available. The fastest way to get your results is to activate your My Chart account. Instructions are located on the last page of this paperwork. If you have not heard from us regarding the results in 2 weeks, please contact this office.

## 2018-09-22 NOTE — Progress Notes (Signed)
Follow up on Cough and Diarrhea and she is feeling better.   [id number]

## 2018-09-22 NOTE — Telephone Encounter (Signed)
Copied from CRM #[id number]. Topic: General - Other >> Sep 22, 2018  1:58 PM Floria Raveling A wrote: Reason for CRM: pt Husband called in, he is out on the road.  He is a [employer] architect.  He stated his wife was seen this week for sinus issues, he is concerned because pt now thinks and is worried she has the covid.  He stated that pt has issue with depression and always thing the worst.  He stated that he will be home tomorrow with her but wanted to see if a nurse could call her and reassure her that she did not have the covid or could ease her mind.  He said that she has been crying all day, I asked if there where any immediate concerns that we needed to contact pt and her her triage and he said no. He stated that pt has not been handling this covid situation very well and is now concerned that she has it.  He would like to know what he needs to do to help his wife.  He stated that he wanted Dr to know what was really going on with his wife and she has been struggling.  Please advise   Best number  [phone] for the pt

## 2018-09-25 ENCOUNTER — Encounter (HOSPITAL_COMMUNITY): Payer: Self-pay | Admitting: Emergency Medicine

## 2018-09-25 ENCOUNTER — Inpatient Hospital Stay (HOSPITAL_COMMUNITY)
Admission: EM | Admit: 2018-09-25 | Discharge: 2018-09-27 | DRG: 153 | Disposition: A | Discharge status: Home / Self Care | Payer: Medicare HMO | Attending: Family Medicine | Admitting: Family Medicine

## 2018-09-25 ENCOUNTER — Emergency Department (HOSPITAL_COMMUNITY): Payer: Medicare HMO

## 2018-09-25 ENCOUNTER — Telehealth (INDEPENDENT_AMBULATORY_CARE_PROVIDER_SITE_OTHER): Payer: Medicare HMO | Admitting: Family Medicine

## 2018-09-25 ENCOUNTER — Other Ambulatory Visit: Payer: Self-pay

## 2018-09-25 DIAGNOSIS — Z09 Encounter for follow-up examination after completed treatment for conditions other than malignant neoplasm: Secondary | ICD-10-CM

## 2018-09-25 DIAGNOSIS — Z20828 Contact with and (suspected) exposure to other viral communicable diseases: Secondary | ICD-10-CM | POA: Diagnosis present

## 2018-09-25 DIAGNOSIS — E038 Other specified hypothyroidism: Secondary | ICD-10-CM | POA: Diagnosis not present

## 2018-09-25 DIAGNOSIS — K5900 Constipation, unspecified: Secondary | ICD-10-CM | POA: Diagnosis present

## 2018-09-25 DIAGNOSIS — R112 Nausea with vomiting, unspecified: Secondary | ICD-10-CM | POA: Diagnosis not present

## 2018-09-25 DIAGNOSIS — J181 Lobar pneumonia, unspecified organism: Secondary | ICD-10-CM

## 2018-09-25 DIAGNOSIS — F39 Unspecified mood [affective] disorder: Secondary | ICD-10-CM | POA: Diagnosis present

## 2018-09-25 DIAGNOSIS — R079 Chest pain, unspecified: Secondary | ICD-10-CM

## 2018-09-25 DIAGNOSIS — R197 Diarrhea, unspecified: Secondary | ICD-10-CM

## 2018-09-25 DIAGNOSIS — Z6838 Body mass index (BMI) 38.0-38.9, adult: Secondary | ICD-10-CM | POA: Diagnosis not present

## 2018-09-25 DIAGNOSIS — R059 Cough, unspecified: Secondary | ICD-10-CM

## 2018-09-25 DIAGNOSIS — E039 Hypothyroidism, unspecified: Secondary | ICD-10-CM | POA: Diagnosis present

## 2018-09-25 DIAGNOSIS — F339 Major depressive disorder, recurrent, unspecified: Secondary | ICD-10-CM | POA: Diagnosis not present

## 2018-09-25 DIAGNOSIS — M5441 Lumbago with sciatica, right side: Secondary | ICD-10-CM | POA: Diagnosis not present

## 2018-09-25 DIAGNOSIS — F419 Anxiety disorder, unspecified: Secondary | ICD-10-CM | POA: Diagnosis present

## 2018-09-25 DIAGNOSIS — I130 Hypertensive heart and chronic kidney disease with heart failure and stage 1 through stage 4 chronic kidney disease, or unspecified chronic kidney disease: Secondary | ICD-10-CM | POA: Diagnosis present

## 2018-09-25 DIAGNOSIS — R51 Headache: Secondary | ICD-10-CM | POA: Diagnosis not present

## 2018-09-25 DIAGNOSIS — R42 Dizziness and giddiness: Secondary | ICD-10-CM

## 2018-09-25 DIAGNOSIS — R509 Fever, unspecified: Secondary | ICD-10-CM | POA: Diagnosis not present

## 2018-09-25 DIAGNOSIS — K5730 Diverticulosis of large intestine without perforation or abscess without bleeding: Secondary | ICD-10-CM | POA: Diagnosis not present

## 2018-09-25 DIAGNOSIS — J069 Acute upper respiratory infection, unspecified: Secondary | ICD-10-CM | POA: Diagnosis not present

## 2018-09-25 DIAGNOSIS — Z79899 Other long term (current) drug therapy: Secondary | ICD-10-CM

## 2018-09-25 DIAGNOSIS — G8929 Other chronic pain: Secondary | ICD-10-CM | POA: Diagnosis present

## 2018-09-25 DIAGNOSIS — E669 Obesity, unspecified: Secondary | ICD-10-CM | POA: Diagnosis present

## 2018-09-25 DIAGNOSIS — G4730 Sleep apnea, unspecified: Secondary | ICD-10-CM | POA: Diagnosis present

## 2018-09-25 DIAGNOSIS — N1832 Chronic kidney disease, stage 3b: Secondary | ICD-10-CM | POA: Diagnosis present

## 2018-09-25 DIAGNOSIS — I5032 Chronic diastolic (congestive) heart failure: Secondary | ICD-10-CM | POA: Diagnosis not present

## 2018-09-25 DIAGNOSIS — N182 Chronic kidney disease, stage 2 (mild): Secondary | ICD-10-CM | POA: Diagnosis not present

## 2018-09-25 DIAGNOSIS — N183 Chronic kidney disease, stage 3 (moderate): Secondary | ICD-10-CM | POA: Diagnosis present

## 2018-09-25 DIAGNOSIS — R1110 Vomiting, unspecified: Secondary | ICD-10-CM | POA: Diagnosis not present

## 2018-09-25 DIAGNOSIS — J189 Pneumonia, unspecified organism: Secondary | ICD-10-CM | POA: Diagnosis not present

## 2018-09-25 DIAGNOSIS — Z8249 Family history of ischemic heart disease and other diseases of the circulatory system: Secondary | ICD-10-CM | POA: Diagnosis not present

## 2018-09-25 DIAGNOSIS — M549 Dorsalgia, unspecified: Secondary | ICD-10-CM | POA: Diagnosis not present

## 2018-09-25 DIAGNOSIS — K219 Gastro-esophageal reflux disease without esophagitis: Secondary | ICD-10-CM | POA: Diagnosis present

## 2018-09-25 DIAGNOSIS — Z79890 Hormone replacement therapy (postmenopausal): Secondary | ICD-10-CM | POA: Diagnosis not present

## 2018-09-25 DIAGNOSIS — R05 Cough: Secondary | ICD-10-CM | POA: Diagnosis not present

## 2018-09-25 DIAGNOSIS — Z888 Allergy status to other drugs, medicaments and biological substances status: Secondary | ICD-10-CM | POA: Diagnosis not present

## 2018-09-25 DIAGNOSIS — R0602 Shortness of breath: Secondary | ICD-10-CM | POA: Diagnosis not present

## 2018-09-25 DIAGNOSIS — N189 Chronic kidney disease, unspecified: Secondary | ICD-10-CM | POA: Diagnosis present

## 2018-09-25 DIAGNOSIS — R1084 Generalized abdominal pain: Secondary | ICD-10-CM | POA: Diagnosis not present

## 2018-09-25 HISTORY — DX: Pneumonia, unspecified organism: J18.9

## 2018-09-25 LAB — CBC
HCT: 37.1 % (ref 36.0–46.0)
Hemoglobin: 11.8 g/dL — ABNORMAL LOW (ref 12.0–15.0)
MCH: 27.1 pg (ref 26.0–34.0)
MCHC: 31.8 g/dL (ref 30.0–36.0)
MCV: 85.3 fL (ref 80.0–100.0)
Platelets: 251 10*3/uL (ref 150–400)
RBC: 4.35 MIL/uL (ref 3.87–5.11)
RDW: 15 % (ref 11.5–15.5)
WBC: 7.5 10*3/uL (ref 4.0–10.5)
nRBC: 0 % (ref 0.0–0.2)

## 2018-09-25 LAB — I-STAT TROPONIN, ED: Troponin i, poc: 0 ng/mL (ref 0.00–0.08)

## 2018-09-25 LAB — URINALYSIS, ROUTINE W REFLEX MICROSCOPIC
Bilirubin Urine: NEGATIVE
Glucose, UA: NEGATIVE mg/dL
Hgb urine dipstick: NEGATIVE
Ketones, ur: NEGATIVE mg/dL
Leukocytes,Ua: NEGATIVE
Nitrite: NEGATIVE
Protein, ur: NEGATIVE mg/dL
Specific Gravity, Urine: 1.006 (ref 1.005–1.030)
pH: 7 (ref 5.0–8.0)

## 2018-09-25 LAB — COMPREHENSIVE METABOLIC PANEL
ALT: 20 U/L (ref 0–44)
AST: 29 U/L (ref 15–41)
Albumin: 3.7 g/dL (ref 3.5–5.0)
Alkaline Phosphatase: 74 U/L (ref 38–126)
Anion gap: 11 (ref 5–15)
BUN: 22 mg/dL (ref 8–23)
CO2: 29 mmol/L (ref 22–32)
Calcium: 9 mg/dL (ref 8.9–10.3)
Chloride: 99 mmol/L (ref 98–111)
Creatinine, Ser: 1.63 mg/dL — ABNORMAL HIGH (ref 0.44–1.00)
GFR calc Af Amer: 38 mL/min — ABNORMAL LOW (ref >60)
GFR calc non Af Amer: 33 mL/min — ABNORMAL LOW (ref >60)
Glucose, Bld: 110 mg/dL — ABNORMAL HIGH (ref 70–99)
Potassium: 4 mmol/L (ref 3.5–5.1)
Sodium: 139 mmol/L (ref 135–145)
Total Bilirubin: 0.4 mg/dL (ref 0.3–1.2)
Total Protein: 7 g/dL (ref 6.5–8.1)

## 2018-09-25 LAB — LIPASE, BLOOD: Lipase: 25 U/L (ref 11–51)

## 2018-09-25 LAB — SARS CORONAVIRUS 2 BY RT PCR (HOSPITAL ORDER, PERFORMED IN ~~LOC~~ HOSPITAL LAB): SARS Coronavirus 2: NEGATIVE

## 2018-09-25 MED ORDER — PANTOPRAZOLE SODIUM 40 MG PO TBEC: 80.0000 mg | DELAYED_RELEASE_TABLET | Freq: Every day | ORAL | Status: DC

## 2018-09-25 MED ORDER — ENOXAPARIN SODIUM 40 MG/0.4ML ~~LOC~~ SOLN
40.0000 mg | Freq: Every day | SUBCUTANEOUS | Status: DC
  Administered 2018-09-26 – 2018-09-27 (×2): 40 mg via SUBCUTANEOUS
  Filled 2018-09-25 (×2): qty 0.4

## 2018-09-25 MED ORDER — QUETIAPINE FUMARATE 50 MG PO TABS
50.0000 mg | ORAL_TABLET | Freq: Every day | ORAL | Status: DC
  Administered 2018-09-26 – 2018-09-27 (×2): 50 mg via ORAL
  Filled 2018-09-25 (×2): qty 1

## 2018-09-25 MED ORDER — SODIUM CHLORIDE 0.9 % IV BOLUS
1000.0000 mL | Freq: Once | INTRAVENOUS | Status: AC
  Administered 2018-09-25: 1000 mL via INTRAVENOUS

## 2018-09-25 MED ORDER — SODIUM CHLORIDE 0.9 % IV SOLN
1.0000 g | Freq: Once | INTRAVENOUS | Status: AC
  Administered 2018-09-25: 1 g via INTRAVENOUS
  Filled 2018-09-25: qty 10

## 2018-09-25 MED ORDER — BENZONATATE 100 MG PO CAPS: 100.0000 mg | ORAL_CAPSULE | Freq: Three times a day (TID) | ORAL | Status: DC | PRN

## 2018-09-25 MED ORDER — DICLOFENAC SODIUM 1 % TD GEL: 2.0000 g | Freq: Every day | TRANSDERMAL | Status: DC | PRN

## 2018-09-25 MED ORDER — METOCLOPRAMIDE HCL 5 MG/ML IJ SOLN
5.0000 mg | Freq: Once | INTRAMUSCULAR | Status: AC
  Administered 2018-09-25: 5 mg via INTRAVENOUS
  Filled 2018-09-25: qty 2

## 2018-09-25 MED ORDER — RIZATRIPTAN BENZOATE 10 MG PO TBDP: 10.0000 mg | ORAL_TABLET | ORAL | Status: DC | PRN

## 2018-09-25 MED ORDER — LEVOTHYROXINE SODIUM 50 MCG PO TABS
50.0000 ug | ORAL_TABLET | Freq: Every day | ORAL | Status: DC
  Administered 2018-09-26 – 2018-09-27 (×2): 50 ug via ORAL
  Filled 2018-09-25 (×2): qty 1

## 2018-09-25 MED ORDER — SODIUM CHLORIDE 0.9 % IV SOLN
1.0000 g | INTRAVENOUS | Status: DC
  Administered 2018-09-27: 1 g via INTRAVENOUS
  Filled 2018-09-25: qty 10
  Filled 2018-09-25: qty 1

## 2018-09-25 MED ORDER — AZITHROMYCIN 250 MG PO TABS
500.0000 mg | ORAL_TABLET | ORAL | Status: DC
  Administered 2018-09-26: 500 mg via ORAL
  Filled 2018-09-25: qty 2

## 2018-09-25 MED ORDER — PRAZOSIN HCL 1 MG PO CAPS
2.0000 mg | ORAL_CAPSULE | Freq: Every day | ORAL | Status: DC
  Filled 2018-09-25: qty 2

## 2018-09-25 MED ORDER — SODIUM CHLORIDE 0.9 % IV SOLN
INTRAVENOUS | Status: DC | PRN
  Administered 2018-09-25: 20:00:00 500 mL via INTRAVENOUS

## 2018-09-25 MED ORDER — GABAPENTIN 300 MG PO CAPS
600.0000 mg | ORAL_CAPSULE | Freq: Three times a day (TID) | ORAL | Status: DC
  Administered 2018-09-26 – 2018-09-27 (×5): 600 mg via ORAL
  Filled 2018-09-25 (×5): qty 2

## 2018-09-25 MED ORDER — FLUTICASONE PROPIONATE 50 MCG/ACT NA SUSP
1.0000 | Freq: Two times a day (BID) | NASAL | Status: DC
  Administered 2018-09-26 – 2018-09-27 (×4): 1 via NASAL
  Filled 2018-09-25: qty 16

## 2018-09-25 MED ORDER — HYDROXYZINE HCL 25 MG PO TABS: 50.0000 mg | ORAL_TABLET | Freq: Four times a day (QID) | ORAL | Status: DC | PRN

## 2018-09-25 MED ORDER — DULOXETINE HCL 30 MG PO CPEP
60.0000 mg | ORAL_CAPSULE | Freq: Every day | ORAL | Status: DC
  Administered 2018-09-26 – 2018-09-27 (×2): 60 mg via ORAL
  Filled 2018-09-25 (×2): qty 2

## 2018-09-25 MED ORDER — SODIUM CHLORIDE 0.9 % IV SOLN
500.0000 mg | Freq: Once | INTRAVENOUS | Status: AC
  Administered 2018-09-25: 500 mg via INTRAVENOUS
  Filled 2018-09-25: qty 500

## 2018-09-25 MED ORDER — BUSPIRONE HCL 10 MG PO TABS
5.0000 mg | ORAL_TABLET | Freq: Three times a day (TID) | ORAL | Status: DC
  Administered 2018-09-26 – 2018-09-27 (×5): 5 mg via ORAL
  Filled 2018-09-25 (×5): qty 1

## 2018-09-25 MED ORDER — MECLIZINE HCL 25 MG PO TABS: 25.0000 mg | ORAL_TABLET | Freq: Three times a day (TID) | ORAL | Status: DC | PRN

## 2018-09-25 MED ORDER — SODIUM CHLORIDE 0.9 % IV SOLN
INTRAVENOUS | Status: DC
  Administered 2018-09-25 – 2018-09-26 (×2): via INTRAVENOUS

## 2018-09-25 MED ORDER — PRAZOSIN HCL 1 MG PO CAPS
4.0000 mg | ORAL_CAPSULE | Freq: Every day | ORAL | Status: DC
  Administered 2018-09-26 – 2018-09-27 (×2): 4 mg via ORAL
  Filled 2018-09-25 (×2): qty 4

## 2018-09-25 MED ORDER — POLYETHYLENE GLYCOL 3350 17 G PO PACK
17.0000 g | PACK | Freq: Every day | ORAL | Status: DC | PRN
  Administered 2018-09-26: 17 g via ORAL
  Filled 2018-09-25: qty 1

## 2018-09-25 MED ORDER — PRAZOSIN HCL 5 MG PO CAPS
5.0000 mg | ORAL_CAPSULE | Freq: Every day | ORAL | Status: DC
  Administered 2018-09-26 – 2018-09-27 (×2): 5 mg via ORAL
  Filled 2018-09-25 (×2): qty 1

## 2018-09-25 MED ORDER — SUMATRIPTAN SUCCINATE 50 MG PO TABS
100.0000 mg | ORAL_TABLET | Freq: Every day | ORAL | Status: DC | PRN
  Administered 2018-09-26: 100 mg via ORAL
  Filled 2018-09-25 (×3): qty 2

## 2018-09-25 NOTE — ED Notes (Signed)
Karleen Hampshire, RN is attempting to start an ultrasound IV since two IV's have infiltrated during this visit.

## 2018-09-25 NOTE — H&P (Signed)
History and Physical    Jennifer Davidson:[id number] DOB: [birth year] DOA: 09/25/2018  PCP: Myles Lipps, MD  Patient coming from: Home  I have personally briefly reviewed patient's old medical records in [facility]  Chief Complaint: Cough, fever, emesis  HPI: Jennifer Davidson is a 63 y.o. female with medical history significant of CKD, chronic back pain, depression.  Patient presents to ED today after being referred by PCP.  Patient has had 2 week history of N/V/D, generalized weakness, cough.  Cough productive of greenish sputum.  Associated fever at home up to 101.9 x3 days ago.  99.6 over past 2 days.  Tylenol for fever symptoms at home.  Course of doxycycline prescribed by PCP last Monday.  Has taken this but symptoms have persisted.   ED Course: CXR neg, but CT abd/pelvis confirms a LLL PNA findings.  Mod stool burden on CT.  Creat 1.6 up from 1.28 in Nov, looks like she usually runs 1.4-1.5 at baseline.  No SIRS.  COVID-19 is negative.   Review of Systems: As per HPI otherwise 10 point review of systems negative.   Past Medical History:  Diagnosis Date   Acute encephalopathy 06/25/2016   Anxiety    Cataract 08/31/2017   left eye   Cholesterol serum increased    Chronic back pain    chronic Rt low back pain. s/p L4-5 fusion. failed Rt facet injections. poss due to Rt SI joint dysfunction.   Chronic kidney disease    Depression    Diastolic heart failure (HCC)    GERD (gastroesophageal reflux disease)    Hyperparathyroidism (HCC)    Hypertension    Hypothyroidism    Migraine    Neuromuscular disorder (HCC)    Sleep apnea    Syncope 08/30/2015   Tachycardia    Trochanteric bursitis of both hips 2012   Confirmed on MRI    Past Surgical History:  Procedure Laterality Date   ABDOMINAL HYSTERECTOMY     APPENDECTOMY  1986   BACK SURGERY     L4-5 fusion   CATARACT EXTRACTION     KNEE SURGERY       reports that she has never smoked.  She has never used smokeless tobacco. She reports that she does not drink alcohol or use drugs.  Allergies  Allergen Reactions   Tramadol Nausea And Vomiting   Trazodone And Nefazodone Other (See Comments)    Per pt trazodone caused hallucinations and behavior changes     Family History  Problem Relation Age of Onset   Diabetes Mother    Heart disease Mother    Kidney disease Mother    Thyroid disease Mother    Heart disease Father    Seizures Father    COPD Father    Irritable bowel syndrome Father    Cancer Maternal Grandmother    Diabetes Sister    Diabetes Brother    Colon cancer Neg Hx    Stomach cancer Neg Hx     Prior to Admission medications   Medication Sig Start Date End Date Taking? Authorizing Provider  benzonatate (TESSALON) 100 MG capsule Take 1 capsule (100 mg total) by mouth 3 (three) times daily as needed for cough. 09/19/18  Yes Shade Flood, MD  busPIRone (BUSPAR) 5 MG tablet Take 1 tablet (5 mg total) by mouth 3 (three) times daily. May take 4th dose in evening for anxiety exacerbation 08/23/18  Yes Myles Lipps, MD  diclofenac sodium (VOLTAREN) 1 %  GEL Apply 2 g topically daily as needed (for knee pain). 08/23/18  Yes Myles Lipps, MD  DULoxetine (CYMBALTA) 60 MG capsule Take 1 capsule (60 mg total) by mouth daily. 08/23/18  Yes Myles Lipps, MD  esomeprazole (NEXIUM) 40 MG capsule TAKE 1 CAPSULE EVERY DAY 08/23/18  Yes Myles Lipps, MD  fluticasone (FLONASE) 50 MCG/ACT nasal spray PLACE 2 SPRAYS AT BEDTIME INTO BOTH NOSTRILS. Patient taking differently: Place 1 spray into both nostrils 2 (two) times a day.  08/23/18  Yes Myles Lipps, MD  gabapentin (NEURONTIN) 600 MG tablet Take 1 tablet (600 mg total) by mouth 3 (three) times daily. 08/23/18  Yes Myles Lipps, MD  hydrOXYzine (ATARAX/VISTARIL) 50 MG tablet Take 1 tablet (50 mg total) by mouth every 6 (six) hours as needed. Patient taking differently: Take 50 mg by mouth every  6 (six) hours as needed for itching.  08/23/18 11/17/18 Yes Myles Lipps, MD  levothyroxine (SYNTHROID, LEVOTHROID) 50 MCG tablet Take 1 tablet (50 mcg total) by mouth daily before breakfast. 08/23/18  Yes Myles Lipps, MD  meclizine (ANTIVERT) 25 MG tablet TAKE 1 TABLET BY MOUTH 3 TIMES DAILY AS NEEDED FOR DIZZINESS 08/23/18  Yes Myles Lipps, MD  Melatonin 10 MG TABS Take 1 tablet by mouth daily.   Yes [provider]  ondansetron (ZOFRAN-ODT) 8 MG disintegrating tablet Take 0.5-1 tablets (4-8 mg total) by mouth every 8 (eight) hours as needed for nausea (from migraines). 03/29/18  Yes [name], MD  prazosin (MINIPRESS) 2 MG capsule TAKE 2 TABLETS BY MOUTH ALONG WITH THE  TABLET TO MAKE A TOTAL OF  DOSE AT BEDTIME 06/22/18  Yes Myles Lipps, MD  prazosin (MINIPRESS) 5 MG capsule Take 2 capsules (10 mg total) by mouth at bedtime. TAKE 2 TO 3 CAPSULES AT BEDTIME 03/29/18  Yes [name], MD  QUEtiapine (SEROQUEL) 50 MG tablet Take 1 tablet (50 mg total) by mouth at bedtime as needed. Patient taking differently: Take 50 mg by mouth at bedtime.  08/23/18  Yes Myles Lipps, MD  rizatriptan (MAXALT-MLT) 10 MG disintegrating tablet Take 1 tablet (10 mg total) by mouth as needed for migraine. May repeat in 2 hours if needed 10/14/17   [name], MD    Physical Exam: Vitals:   09/25/18 1333 09/25/18 1717 09/25/18 1800 09/25/18 1958  BP: 125/76 120/76 130/74 122/74  Pulse: 70 69 71 70  Resp: 16 (!) Temp: 98.1 F (36.7 C) 97.6 F (36.4 C)  97.8 F (36.6 C)  TempSrc: Oral Oral  Oral  SpO2: 95% 98% 98% 96%    Constitutional: NAD, calm, comfortable Eyes: PERRL, lids and conjunctivae normal ENMT: Mucous membranes are moist. Posterior pharynx clear of any exudate or lesions.Normal dentition.  Neck: normal, supple, no masses, no thyromegaly Respiratory: clear to auscultation bilaterally, no wheezing, no crackles. Normal respiratory effort. No accessory muscle use.   Cardiovascular: Regular rate and rhythm, no murmurs / rubs / gallops. No extremity edema. 2+ pedal pulses. No carotid bruits.  Abdomen: no tenderness, no masses palpated. No hepatosplenomegaly. Bowel sounds positive.  Musculoskeletal: no clubbing / cyanosis. No joint deformity upper and lower extremities. Good ROM, no contractures. Normal muscle tone.  Skin: no rashes, lesions, ulcers. No induration Neurologic: CN 2-12 grossly intact. Sensation intact, DTR normal. Strength 5/5 in all 4.  Psychiatric: Normal judgment and insight. Alert and oriented x 3. Normal mood.  Labs on Admission: I have personally reviewed following labs and imaging studies  CBC: Recent Labs  Lab 09/25/18 1528  WBC 7.5  HGB 11.8*  HCT 37.1  MCV 85.3  PLT 251   Basic Metabolic Panel: Recent Labs  Lab 09/25/18 1528  NA 139  K 4.0  CL 99  CO2 29  GLUCOSE 110*  BUN 22  CREATININE 1.63*  CALCIUM 9.0   GFR: CrCl cannot be calculated (Unknown ideal weight.). Liver Function Tests: Recent Labs  Lab 09/25/18 1528  AST 29  ALT 20  ALKPHOS 74  BILITOT 0.4  PROT 7.0  ALBUMIN 3.7   Recent Labs  Lab 09/25/18 1528  LIPASE 25   No results for input(s): AMMONIA in the last 168 hours. Coagulation Profile: No results for input(s): INR, PROTIME in the last 168 hours. Cardiac Enzymes: No results for input(s): CKTOTAL, CKMB, CKMBINDEX, TROPONINI in the last 168 hours. BNP (last 3 results) No results for input(s): PROBNP in the last 8760 hours. HbA1C: No results for input(s): HGBA1C in the last 72 hours. CBG: No results for input(s): GLUCAP in the last 168 hours. Lipid Profile: No results for input(s): CHOL, HDL, LDLCALC, TRIG, CHOLHDL, LDLDIRECT in the last 72 hours. Thyroid Function Tests: No results for input(s): TSH, T4TOTAL, FREET4, T3FREE, THYROIDAB in the last 72 hours. Anemia Panel: No results for input(s): VITAMINB12, FOLATE, FERRITIN, TIBC, IRON, RETICCTPCT in the last 72 hours. Urine  analysis:    Component Value Date/Time   COLORURINE STRAW (A) 09/25/2018 1345   APPEARANCEUR CLEAR 09/25/2018 1345   LABSPEC 1.006 09/25/2018 1345   PHURINE 7.0 09/25/2018 1345   GLUCOSEU NEGATIVE 09/25/2018 1345   HGBUR NEGATIVE 09/25/2018 1345   BILIRUBINUR NEGATIVE 09/25/2018 1345   BILIRUBINUR negative 03/29/2018 0927   BILIRUBINUR neg 12/15/2014 1059   KETONESUR NEGATIVE 09/25/2018 1345   PROTEINUR NEGATIVE 09/25/2018 1345   UROBILINOGEN 0.2 03/29/2018 0927   UROBILINOGEN 1.0 12/03/2014 0648   NITRITE NEGATIVE 09/25/2018 1345   LEUKOCYTESUR NEGATIVE 09/25/2018 1345    Radiological Exams on Admission: Ct Abdomen Pelvis Wo Contrast  Result Date: 09/25/2018 CLINICAL DATA:  63 year old female with abdominal pain. Concern for gastroenteritis or colitis. EXAM: CT ABDOMEN AND PELVIS WITHOUT CONTRAST TECHNIQUE: Multidetector CT imaging of the abdomen and pelvis was performed following the standard protocol without IV contrast. COMPARISON:  CT of the abdomen pelvis dated 09/16/2017 FINDINGS: Evaluation of this exam is limited in the absence of intravenous contrast. Lower chest: There is a cluster of nodular density at the left lung base concerning for an infectious process. No intra-abdominal free air or free fluid. Hepatobiliary: No focal liver abnormality is seen. No gallstones, gallbladder wall thickening, or biliary dilatation. Pancreas: Unremarkable. No pancreatic ductal dilatation or surrounding inflammatory changes. Spleen: Normal in size without focal abnormality. Adrenals/Urinary Tract: Adrenal glands are unremarkable. Kidneys are normal, without renal calculi, focal lesion, or hydronephrosis. Bladder is unremarkable. Stomach/Bowel: There is sigmoid diverticulosis without active inflammatory changes. There is moderate amount of stool throughout the colon. No bowel obstruction or active inflammation. The cecum is mobile and located in the left upper abdomen. There is no evidence of  twisting or obstruction. Appendectomy. Vascular/Lymphatic: The abdominal aorta and IVC are unremarkable on this noncontrast CT. No portal venous gas. There is no adenopathy. Reproductive: Hysterectomy. No pelvic mass. Other: None Musculoskeletal: Osteopenia with degenerative changes of the spine. L4-L5 posterior fusion. No acute osseous pathology. IMPRESSION: 1. Cluster of nodular density at the left lung base concerning for an infectious  process. Clinical correlation and follow-up recommended. 2. Moderate colonic stool burden. No bowel obstruction or active inflammation. 3. Sigmoid diverticulosis. Electronically Signed   By: Elgie CollardArash  Radparvar M.D.   On: 09/25/2018 19:34   Dg Chest Portable 1 View  Result Date: 09/25/2018 CLINICAL DATA:  Fevers, headache, and vomiting. Assessment for COVID-19. EXAM: PORTABLE CHEST 1 VIEW COMPARISON:  05/22/2018 FINDINGS: The cardiomediastinal silhouette is grossly unchanged allowing for rightward patient rotation and magnification from portable AP technique. No airspace consolidation, edema, pleural effusion, or pneumothorax is identified. Mild lower thoracic levoscoliosis is again noted. IMPRESSION: No active disease. Electronically Signed   By: Sebastian AcheAllen  Grady M.D.   On: 09/25/2018 17:26    EKG: Independently reviewed.  Assessment/Plan Principal Problem:   Community acquired pneumonia of left lower lobe of lung (HCC) Active Problems:   Chronic kidney disease   Hypothyroid   Chronic back pain   Major depression, recurrent, chronic (HCC)    1. LLL CAP - 1. Failed outpt doxy 2. COVID-19 is negative 3. PNA pathway 4. Rocephin / azithro 5. Repeat CBC/BMP in AM 6. BCx, sputum Cx pending 2. CKD - 1. Creat 1.6 today, slightly higher than baseline 2. IVF: NS at 125 over night 3. Repeat BMP in AM 3. Depression - 1. Continue home BP meds 4. Chronic back pain - Continue neurontin 5. Hypothyroidism - continue Synthroid 6. Mod stool burden on CT - 1. Miralax PRN  to start with  DVT prophylaxis: Lovenox Code Status: Full Family Communication: No family in room Disposition Plan: Home after admit Consults called: none Admission status: Admit to inpatient  Severity of Illness: The appropriate patient status for this patient is INPATIENT. Inpatient status is judged to be reasonable and necessary in order to provide the required intensity of service to ensure the patient's safety. The patient's presenting symptoms, physical exam findings, and initial radiographic and laboratory data in the context of their chronic comorbidities is felt to place them at high risk for further clinical deterioration. Furthermore, it is not anticipated that the patient will be medically stable for discharge from the hospital within 2 midnights of admission. The following factors support the patient status of inpatient.   CAP of LLL, failed outpatient doxycycline course, admit for inpatient IV ABx   * I certify that at the point of admission it is my clinical judgment that the patient will require inpatient hospital care spanning beyond 2 midnights from the point of admission due to high intensity of service, high risk for further deterioration and high frequency of surveillance required.*    Lachlan Mckim M. DO [facility]  How to contact the [facility] Attending or Consulting provider 7A - 7P or covering provider during after hours 7P -7A, for this patient?  1. Check the care team in [facility] and look for a) attending/consulting [facility] provider listed and b) the [facility] team listed 2. Log into [email or web address]  Amion Physician Scheduling and messaging for groups and whole hospitals  On call and physician scheduling software for group practices, residents, hospitalists and other medical providers for call, clinic, rotation and shift schedules. OnCall [employer] is a hospital-wide system for scheduling doctors and paging doctors on call. EasyPlot is for scientific plotting and data  analysis.  [email or web address]  and use [place]'s universal password to access. If you do not have the password, please contact the hospital operator.  3. Locate the [facility] provider you are looking for under [facility] and page to a number that you can be directly  reached. 4. If you still have difficulty reaching the provider, please page the [facility] (Director on Call) for the Hospitalists listed on amion for assistance.  09/25/2018, 9:00 PM

## 2018-09-25 NOTE — Patient Instructions (Signed)
° ° ° °  If you have lab work done today you will be contacted with your lab results within the next 2 weeks.  If you have not heard from us then please contact us. The fastest way to get your results is to register for My Chart. ° ° °IF you received an x-ray today, you will receive an invoice from [facility]. Please contact  Radiology at [phone] with questions or concerns regarding your invoice.  ° °IF you received labwork today, you will receive an invoice from [facility]. Please contact [facility] at [phone] with questions or concerns regarding your invoice.  ° °Our billing staff will not be able to assist you with questions regarding bills from these companies. ° °You will be contacted with the lab results as soon as they are available. The fastest way to get your results is to activate your My Chart account. Instructions are located on the last page of this paperwork. If you have not heard from us regarding the results in 2 weeks, please contact this office. °  ° ° ° °

## 2018-09-25 NOTE — Progress Notes (Signed)
Virtual Visit via Telephone Note  I connected with Jennifer Davidson on 09/25/18 at 11:26 AM by telephone and verified that I am speaking with the correct person using two identifiers.   I discussed the limitations, risks, security and privacy concerns of performing an evaluation and management service by telephone and the availability of in person appointments. I also discussed with the patient that there may be a patient responsible charge related to this service. The patient expressed understanding and agreed to proceed, consent obtained  Chief complaint:  Cough, diarrhea.    History of Present Illness: Jennifer Davidson is a 63 y.o. female  Cough: See prior 2 visits, most recently seen 3 days ago.  Longstanding cough since January with episodic improvement then worsening with sinuses in the prior week.  Treated with doxycycline starting May 5 for possible secondary sinusitis/bronchitis, and restarted Tessalon Perles. Improving May 8 with less cough, still some sinus congestions.  No fever or dyspnea at that time. Cough is a lot better.  No shortness of breath.  Fever 101.9  -when checked 3 days ago, then 99.6 past 2 days.  Having bodyache, headache for a few days in a row.  Feels much better than past 2 days.  Tessalon for cough.   Nausea/vomiting/diarrhea. Nausea and vomiting had improved, still some diarrhea, 1 episode of vomiting on the sixth.  Had improved for 2 days prior to last visit.  No fever.  Zofran was used for nausea/vomiting.  Dizziness was improving at last visit with increase hydration.  Vomited 4 times yesterday even with  taking zofran - 3 times per day. Trying to drink fluids, but difficult with vomiting.  Minimal solid foods. UOP 6-7 in past 24 hours. More than normal. Feels like drinking sufficient fluids. Min diarrhea - just occasional.  Diarrhea 4 times in past 24 hours.  Not having any new lightheadedness - still up up and downs.  Left upper chest pain -  yesterday. Sitting out on porch. No activity at that time. Has ongoing left shoulder pain - waiting on shoulder surgery, but pain in chest - 10/10 at that time - lasted for a few hours. No specific treatments. Resolved on own.   Some pain in upper left chest now -5/10, sharp pain, but some pressure.  No associated diaphoresis, but still some nausea. No dyspnea.  No prior heart history known. Echo in 2017- EF 60-65%, no wall motion abnormalities.          Patient Active Problem List   Diagnosis Date Noted  . Head injury 09/08/2018  . Fall as cause of accidental injury at home as place of occurrence 09/08/2018  . Hallucination, visual 09/14/2017  . Prediabetes 09/14/2017  . Insomnia due to other mental disorder 09/14/2017  . Dream anxiety disorder 09/14/2017  . Vivid dream 09/14/2017  . Altered mental status 06/23/2017  . Irritable bowel syndrome with diarrhea 04/25/2017  . Sacroiliac joint dysfunction of right side 03/17/2017  . 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  . Seizure-like activity (HCC) 09/14/2016  . Moderate episode of recurrent major depressive disorder (HCC) 08/31/2016  . PTSD (post-traumatic stress disorder) 08/31/2016  . Recurrent UTI 06/25/2016  . Severe recurrent depression with psychosis (HCC) 06/25/2016  . GERD (gastroesophageal reflux disease) 11/13/2015  . Generalized anxiety disorder 06/14/2015  . Chronic pain of right lower extremity 05/14/2015  . Neuropathy of lower extremity 05/14/2015  . Headache, migraine 01/17/2015  . Severe recurrent major depressive disorder with psychotic features (HCC)   .  Major depression, recurrent, chronic (HCC) 12/02/2014  . Mood disorder (HCC) 12/01/2014  . Chronic back pain   . Hyperparathyroidism, 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  . Neuromuscular disorder (HCC) 07/31/2014  . Lower back pain 09/21/2013  . Diastolic heart failure (HCC) 03/17/2012  . Syncope and collapse 02/12/2012  . Chronic kidney  disease 02/04/2012  . Edema 02/04/2012  . Tachycardia 02/04/2012  . Encounter for long-term (current) use of medications 02/04/2012  . Anxiety and depression 02/04/2012  . Hypothyroid 02/04/2012  . Hyperlipidemia 02/04/2012   Past Medical History:  Diagnosis Date  . Acute encephalopathy 06/25/2016  . Anxiety   . Cataract 08/31/2017   left eye  . Cholesterol serum increased   . Chronic back pain    chronic Rt low back pain. s/p L4-5 fusion. failed Rt facet injections. poss due to Rt SI joint dysfunction.  . Chronic kidney disease   . Depression   . Diastolic heart failure (HCC)   . GERD (gastroesophageal reflux disease)   . Hyperparathyroidism (HCC)   . Hypertension   . Hypothyroidism   . Migraine   . Neuromuscular disorder (HCC)   . Sleep apnea   . Syncope 08/30/2015  . Tachycardia   . Trochanteric bursitis of both hips 2012   Confirmed on MRI   Past Surgical History:  Procedure Laterality Date  . ABDOMINAL HYSTERECTOMY    . APPENDECTOMY  1986  . BACK SURGERY     L4-5 fusion  . CATARACT EXTRACTION    . KNEE SURGERY     Allergies  Allergen Reactions  . Tramadol Nausea And Vomiting  . Trazodone And Nefazodone Other (See Comments)    Per pt trazodone caused hallucinations and behavior changes    Prior to Admission medications   Medication Sig Start Date End Date Taking? Authorizing Provider  benzonatate (TESSALON) 100 MG capsule Take 1 capsule (100 mg total) by mouth 3 (three) times daily as needed for cough. 09/19/18   Shade Flood, MD  busPIRone (BUSPAR) 5 MG tablet Take 1 tablet (5 mg total) by mouth 3 (three) times daily. May take 4th dose in evening for anxiety exacerbation 08/23/18   Myles Lipps, MD  [facility] MELATONIN 5 MG TABS TAKE 1 CAPSULE (5 MG TOTAL) BY MOUTH AT BEDTIME. 01/31/18   [name], MD  diclofenac sodium (VOLTAREN) 1 % GEL Apply 2 g topically daily as needed (for knee pain). 08/23/18   Myles Lipps, MD  doxycycline (VIBRA-TABS) 100 MG tablet  Take 1 tablet (100 mg total) by mouth 2 (two) times daily. 09/19/18   Shade Flood, MD  DULoxetine (CYMBALTA) 60 MG capsule Take 1 capsule (60 mg total) by mouth daily. 08/23/18   Myles Lipps, MD  esomeprazole (NEXIUM) 40 MG capsule TAKE 1 CAPSULE EVERY DAY 08/23/18   Myles Lipps, MD  fluticasone [facility]) 50 MCG/ACT nasal spray PLACE 2 SPRAYS AT BEDTIME INTO BOTH NOSTRILS. 08/23/18   Myles Lipps, MD  gabapentin (NEURONTIN) 600 MG tablet Take 1 tablet (600 mg total) by mouth 3 (three) times daily. 08/23/18   Myles Lipps, MD  hydrOXYzine (ATARAX/VISTARIL) 50 MG tablet Take 1 tablet (50 mg total) by mouth every 6 (six) hours as needed. 08/23/18 11/17/18  Myles Lipps, MD  levothyroxine (SYNTHROID, LEVOTHROID) 50 MCG tablet Take 1 tablet (50 mcg total) by mouth daily before breakfast. 08/23/18   Myles Lipps, MD  meclizine (ANTIVERT) 25 MG tablet TAKE 1 TABLET BY MOUTH 3 TIMES DAILY  AS NEEDED FOR DIZZINESS 08/23/18   Myles Lipps, MD  ondansetron (ZOFRAN-ODT) 8 MG disintegrating tablet Take 0.5-1 tablets (4-8 mg total) by mouth every 8 (eight) hours as needed for nausea (from migraines). 03/29/18   [name], MD  prazosin (MINIPRESS) 2 MG capsule TAKE 2 TABLETS BY MOUTH ALONG WITH THE  TABLET TO MAKE A TOTAL OF  DOSE AT BEDTIME 06/22/18   Myles Lipps, MD  prazosin (MINIPRESS) 5 MG capsule Take 2 capsules (10 mg total) by mouth at bedtime. TAKE 2 TO 3 CAPSULES AT BEDTIME 03/29/18   [name], MD  propranolol (INDERAL) 20 MG tablet TAKE 1-2 TABLETS BY MOUTH AT BEDTIME. 06/05/18   [name], MD  QUEtiapine (SEROQUEL) 300 MG tablet TAKE 1 TABLET BY MOUTH EVERYDAY AT BEDTIME 07/04/18   Collie Siad A, MD  QUEtiapine (SEROQUEL) 50 MG tablet Take 1 tablet (50 mg total) by mouth at bedtime as needed. 08/23/18   Myles Lipps, MD  rizatriptan (MAXALT-MLT) 10 MG disintegrating tablet Take 1 tablet (10 mg total) by mouth as needed for migraine. May repeat in 2 hours if needed  10/14/17   [name], MD   Social History   Socioeconomic History  . Marital status: Married    Spouse name: Not on file  . Number of children: 3  . Years of education: 58  . Highest education level: Some college, no degree  Occupational History  . Not on file  Social Needs  . Financial resource strain: Not hard at all  . Food insecurity:    Worry: Never true    Inability: Never true  . Transportation needs:    Medical: No    Non-medical: No  Tobacco Use  . Smoking status: Never Smoker  . Smokeless tobacco: Never Used  Substance and Sexual Activity  . Alcohol use: No  . Drug use: No    Comment: 08-31-2016 PER PT NO   . Sexual activity: Yes    Partners: Male    Comment: married  Lifestyle  . Physical activity:    Days per week: 0 days    Minutes per session: 0 min  . Stress: Very much  Relationships  . Social connections:    Talks on phone: More than three times a week    Gets together: More than three times a week    Attends religious service: Never    Active member of club or organization: No    Attends meetings of clubs or organizations: Never    Relationship status: Married  . Intimate partner violence:    Fear of current or ex partner: No    Emotionally abused: No    Physically abused: No    Forced sexual activity: No  Other Topics Concern  . Not on file  Social History Narrative  . Not on file     Observations/Objective: Speaking in full sentences.   Assessment and Plan: Cough  Nausea and vomiting, intractability of vomiting not specified, unspecified vomiting type  Chest pain, unspecified type  Episodic lightheadedness  Diarrhea, unspecified type  Fever, unspecified  Longstanding intermittent cough with recent worsening for 1 week and increased sinus symptoms when discussed May 5. Purulent nasal discharge intermittently at that time.  Did report some vomiting few days prior that had resolved, diarrhea 1 week prior.  Initially reported some  dyspnea few days prior to May 5 visit, but had resolved.  Some bodyache, some headache, no measured fevers at that  time.  Did have some chills.  Longstanding intermittent dizziness thought to be multifactorial including medications along with that recent illness and vomiting/diarrhea.  -For cough and sinus worsening, we decided to start doxycycline for possible secondary bacterial infection, but cautioned on possible impact on diarrhea. Tessalon Perles were restarted.Discussed potential for COVID-19 given concurrent symptoms but initial decision for symptomatic care and outpatient treatment as not significantly dyspneic at that time, was mentating appropriately and was tolerating p.o. fluids.  -May 8 was improving.  Discussed nausea/vomiting and diarrhea and potential impact with doxycycline but ultimately decided to remain on same.  Possible COVID 19 infection was discussed but as improving decided to continue home treatment with close follow-up.  -Now has had some interval worsening over the weekend with return of nausea and vomiting, and now reports left-sided chest pain, most severe yesterday but still present today with intermittent episodes.  Multiple episodes of vomiting yesterday even with taking Zofran.  Reports good urine output but still some intermittent dizziness.  No known history of cardiac disease and prior echo few years ago was reassuring.   --With worsening symptoms over the weekend, fevers, nausea and vomiting on Zofran and new chest pain, decided it best that she be evaluated through the ER for chest pain symptoms, possible COVID testing, decide on imaging, and assessment of hydration status.  Understand expressed and [facility].   Follow Up Instructions: TBD from ER visit.    I discussed the assessment and treatment plan with the patient. The patient was provided an opportunity to ask questions and all were answered. The patient agreed with the plan and demonstrated  an understanding of the instructions.   The patient was advised to call back or seek an in-person evaluation if the symptoms worsen or if the condition fails to improve as anticipated.  I provided 17 minutes of non-face-to-face time during this encounter.  Signed,   Meredith StaggersJeffrey Beatris Belen, MD [facility].  09/25/18

## 2018-09-25 NOTE — ED Provider Notes (Signed)
[facility]-EMERGENCY DEPT Provider Note   CSN: [id number] Arrival date & time: 09/25/18  1320    History   Chief Complaint Chief Complaint  Patient presents with   Emesis   Fever   Headache    HPI Jennifer Davidson is a 63 y.o. female.     HPI 63 year old female with a past medical history significant for CKD, chronic back pain, hypothyroidism, hypertension who presents to the emergency department today after being referred by her primary care doctor for evaluation of nausea, vomiting, diarrhea and generalized weakness for the past 2 weeks.  Patient states that for the past 2 weeks she has had a productive cough with yellow-green sputum.  She also reports generalized weakness with daily episodes of vomiting and diarrhea.  Patient states that she has had poor p.o. intake secondary to her nausea and vomiting.  Reports 2 episodes of vomiting earlier today.  No bloody stools.  Patient reports that her cough is improving however she does report sinus congestion along with rhinorrhea.  Mild sore throat.  She does report some shortness of breath with the cough but denies any significant chest pain.  Patient states that she was treated on May 5 with doxycycline and Tessalon for possible sinusitis versus bronchitis.  She was also given Zofran.  Patient has tried Zofran at home without any relief of the nausea and vomiting.  Patient reports a fever 3 days ago of 101.9 and then temperatures have been 99.6 the past 2 days when she checked.  Has been taking Tylenol for her symptoms at home.  Reports headaches with her fever.  She states that she is having some improvement however she just feels rundown given the vomiting is concerned she may be dehydrated.  Patient denies any heart history.  Echo in 2017 revealed an EF of 60 to 65%.  Patient saw her primary care doctor through an ED visit today who recommended patient come to the ED for IV hydration and possible admission.  Patient has  no known sick contacts.  No recent travel.  No alleviating or aggravating factors. Past Medical History:  Diagnosis Date   Acute encephalopathy 06/25/2016   Anxiety    Cataract 08/31/2017   left eye   Cholesterol serum increased    Chronic back pain    chronic Rt low back pain. s/p L4-5 fusion. failed Rt facet injections. poss due to Rt SI joint dysfunction.   Chronic kidney disease    Depression    Diastolic heart failure (HCC)    GERD (gastroesophageal reflux disease)    Hyperparathyroidism (HCC)    Hypertension    Hypothyroidism    Migraine    Neuromuscular disorder (HCC)    Sleep apnea    Syncope 08/30/2015   Tachycardia    Trochanteric bursitis of both hips 2012   Confirmed on MRI    Patient Active Problem List   Diagnosis Date Noted   Head injury 09/08/2018   Fall as cause of accidental injury at home as place of occurrence 09/08/2018   Hallucination, visual 09/14/2017   Prediabetes 09/14/2017   Insomnia due to other mental disorder 09/14/2017   Dream anxiety disorder 09/14/2017   Vivid dream 09/14/2017   Altered mental status 06/23/2017   Irritable bowel syndrome with diarrhea 04/25/2017   Sacroiliac joint dysfunction of right side 03/17/2017   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   Seizure-like activity (HCC) 09/14/2016   Moderate episode of recurrent major depressive disorder (HCC) 08/31/2016  PTSD (post-traumatic stress disorder) 08/31/2016   Recurrent UTI 06/25/2016   Severe recurrent depression with psychosis (HCC) 06/25/2016   GERD (gastroesophageal reflux disease) 11/13/2015   Generalized anxiety disorder 06/14/2015   Chronic pain of right lower extremity 05/14/2015   Neuropathy of lower extremity 05/14/2015   Headache, migraine 01/17/2015   Severe recurrent major depressive disorder with psychotic features (HCC)    Major depression, recurrent, chronic (HCC) 12/02/2014   Mood disorder (HCC)  12/01/2014   Chronic back pain    Hyperparathyroidism, 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   Neuromuscular disorder (HCC) 07/31/2014   Lower back pain 09/21/2013   Diastolic heart failure (HCC) 03/17/2012   Syncope and collapse 02/12/2012   Chronic kidney disease 02/04/2012   Edema 02/04/2012   Tachycardia 02/04/2012   Encounter for long-term (current) use of medications 02/04/2012   Anxiety and depression 02/04/2012   Hypothyroid 02/04/2012   Hyperlipidemia 02/04/2012    Past Surgical History:  Procedure Laterality Date   ABDOMINAL HYSTERECTOMY     APPENDECTOMY  1986   BACK SURGERY     L4-5 fusion   CATARACT EXTRACTION     KNEE SURGERY       OB History   No obstetric history on file.      Home Medications    Prior to Admission medications   Medication Sig Start Date End Date Taking? Authorizing Provider  benzonatate (TESSALON) 100 MG capsule Take 1 capsule (100 mg total) by mouth 3 (three) times daily as needed for cough. 09/19/18   Shade Flood, MD  busPIRone (BUSPAR) 5 MG tablet Take 1 tablet (5 mg total) by mouth 3 (three) times daily. May take 4th dose in evening for anxiety exacerbation 08/23/18   Myles Lipps, MD  [facility] MELATONIN 5 MG TABS TAKE 1 CAPSULE (5 MG TOTAL) BY MOUTH AT BEDTIME. 01/31/18   [name], MD  diclofenac sodium (VOLTAREN) 1 % GEL Apply 2 g topically daily as needed (for knee pain). 08/23/18   Myles Lipps, MD  doxycycline (VIBRA-TABS) 100 MG tablet Take 1 tablet (100 mg total) by mouth 2 (two) times daily. 09/19/18   Shade Flood, MD  DULoxetine (CYMBALTA) 60 MG capsule Take 1 capsule (60 mg total) by mouth daily. 08/23/18   Myles Lipps, MD  esomeprazole (NEXIUM) 40 MG capsule TAKE 1 CAPSULE EVERY DAY 08/23/18   Myles Lipps, MD  fluticasone [facility]) 50 MCG/ACT nasal spray PLACE 2 SPRAYS AT BEDTIME INTO BOTH NOSTRILS. 08/23/18   Myles Lipps, MD  gabapentin (NEURONTIN) 600 MG tablet Take 1 tablet (600 mg total) by  mouth 3 (three) times daily. 08/23/18   Myles Lipps, MD  hydrOXYzine (ATARAX/VISTARIL) 50 MG tablet Take 1 tablet (50 mg total) by mouth every 6 (six) hours as needed. 08/23/18 11/17/18  Myles Lipps, MD  levothyroxine (SYNTHROID, LEVOTHROID) 50 MCG tablet Take 1 tablet (50 mcg total) by mouth daily before breakfast. 08/23/18   Myles Lipps, MD  meclizine (ANTIVERT) 25 MG tablet TAKE 1 TABLET BY MOUTH 3 TIMES DAILY AS NEEDED FOR DIZZINESS 08/23/18   Myles Lipps, MD  ondansetron (ZOFRAN-ODT) 8 MG disintegrating tablet Take 0.5-1 tablets (4-8 mg total) by mouth every 8 (eight) hours as needed for nausea (from migraines). 03/29/18   [name], MD  prazosin (MINIPRESS) 2 MG capsule TAKE 2 TABLETS BY MOUTH ALONG WITH THE  TABLET TO MAKE A TOTAL OF  DOSE AT BEDTIME 06/22/18   Koren Shiver M,  MD  prazosin (MINIPRESS) 5 MG capsule Take 2 capsules (10 mg total) by mouth at bedtime. TAKE 2 TO 3 CAPSULES AT BEDTIME 03/29/18   [name], MD  propranolol (INDERAL) 20 MG tablet TAKE 1-2 TABLETS BY MOUTH AT BEDTIME. 06/05/18   [name], MD  QUEtiapine (SEROQUEL) 300 MG tablet TAKE 1 TABLET BY MOUTH EVERYDAY AT BEDTIME 07/04/18   Collie Siad A, MD  QUEtiapine (SEROQUEL) 50 MG tablet Take 1 tablet (50 mg total) by mouth at bedtime as needed. 08/23/18   Myles Lipps, MD  rizatriptan (MAXALT-MLT) 10 MG disintegrating tablet Take 1 tablet (10 mg total) by mouth as needed for migraine. May repeat in 2 hours if needed 10/14/17   [name], MD    Family History Family History  Problem Relation Age of Onset   Diabetes Mother    Heart disease Mother    Kidney disease Mother    Thyroid disease Mother    Heart disease Father    Seizures Father    COPD Father    Irritable bowel syndrome Father    Cancer Maternal Grandmother    Diabetes Sister    Diabetes Brother    Colon cancer Neg Hx    Stomach cancer Neg Hx     Social History Social History   Tobacco Use   Smoking  status: Never Smoker   Smokeless tobacco: Never Used  Substance Use Topics   Alcohol use: No   Drug use: No    Comment: 08-31-2016 PER PT NO      Allergies   Tramadol and Trazodone and nefazodone   Review of Systems Review of Systems  Constitutional: Positive for chills and fever.  HENT: Positive for congestion, rhinorrhea and sore throat.   Eyes: Negative for discharge.  Respiratory: Positive for cough and shortness of breath. Negative for wheezing.   Cardiovascular: Negative for chest pain, palpitations and leg swelling.  Gastrointestinal: Positive for diarrhea, nausea and vomiting. Negative for abdominal pain.  Genitourinary: Negative for dysuria, flank pain, frequency, hematuria and urgency.  Musculoskeletal: Positive for myalgias.  Skin: Negative for color change.  Neurological: Positive for weakness and headaches. Negative for dizziness and light-headedness.     Physical Exam Updated Vital Signs BP 120/76 (BP Location: Right Arm)    Pulse 69    Temp 97.6 F (36.4 C) (Oral)    Resp (!) 22    SpO2 98%   Physical Exam Vitals signs and nursing note reviewed.  Constitutional:      General: She is not in acute distress.    Appearance: She is well-developed. She is not ill-appearing or toxic-appearing.  HENT:     Head: Normocephalic and atraumatic.     Right Ear: Tympanic membrane normal.     Left Ear: Tympanic membrane normal.     Nose: Congestion and rhinorrhea present.     Mouth/Throat:     Mouth: Mucous membranes are moist.  Eyes:     General:        Right eye: No discharge.        Left eye: No discharge.     Conjunctiva/sclera: Conjunctivae normal.     Pupils: Pupils are equal, round, and reactive to light.  Neck:     Musculoskeletal: Normal range of motion and neck supple. No neck rigidity.  Cardiovascular:     Rate and Rhythm: Normal rate and regular rhythm.     Heart sounds: Normal heart sounds.  Pulmonary:  Effort: Pulmonary effort is normal. No  respiratory distress.     Breath sounds: Normal breath sounds.  Chest:     Chest wall: No tenderness.  Abdominal:     General: Abdomen is flat. Bowel sounds are normal.     Palpations: Abdomen is soft.     Tenderness: There is generalized abdominal tenderness. There is no right CVA tenderness, left CVA tenderness, guarding or rebound. Negative signs include Murphy's sign, Rovsing's sign and McBurney's sign.  Musculoskeletal: Normal range of motion.        General: No tenderness.  Lymphadenopathy:     Cervical: No cervical adenopathy.  Skin:    General: Skin is warm and dry.     Capillary Refill: Capillary refill takes less than 2 seconds.  Neurological:     Mental Status: She is alert and oriented to person, place, and time.  Psychiatric:        Mood and Affect: Mood normal.        Behavior: Behavior normal.        Thought Content: Thought content normal.        Judgment: Judgment normal.      ED Treatments / Results  Labs (all labs ordered are listed, but only abnormal results are displayed) Labs Reviewed  COMPREHENSIVE METABOLIC PANEL - Abnormal; Notable for the following components:      Result Value   Glucose, Bld 110 (*)    Creatinine, Ser 1.63 (*)    GFR calc non Af Amer 33 (*)    GFR calc Af Amer 38 (*)    All other components within normal limits  CBC - Abnormal; Notable for the following components:   Hemoglobin 11.8 (*)    All other components within normal limits  URINALYSIS, ROUTINE W REFLEX MICROSCOPIC - Abnormal; Notable for the following components:   Color, Urine STRAW (*)    All other components within normal limits  SARS CORONAVIRUS 2 (HOSPITAL ORDER, PERFORMED IN [facility])  LIPASE, BLOOD  I-STAT TROPONIN, ED    EKG None  Radiology Ct Abdomen Pelvis Wo Contrast  Result Date: 09/25/2018 CLINICAL DATA:  63 year old female with abdominal pain. Concern for gastroenteritis or colitis. EXAM: CT ABDOMEN AND PELVIS WITHOUT CONTRAST  TECHNIQUE: Multidetector CT imaging of the abdomen and pelvis was performed following the standard protocol without IV contrast. COMPARISON:  CT of the abdomen pelvis dated 09/16/2017 FINDINGS: Evaluation of this exam is limited in the absence of intravenous contrast. Lower chest: There is a cluster of nodular density at the left lung base concerning for an infectious process. No intra-abdominal free air or free fluid. Hepatobiliary: No focal liver abnormality is seen. No gallstones, gallbladder wall thickening, or biliary dilatation. Pancreas: Unremarkable. No pancreatic ductal dilatation or surrounding inflammatory changes. Spleen: Normal in size without focal abnormality. Adrenals/Urinary Tract: Adrenal glands are unremarkable. Kidneys are normal, without renal calculi, focal lesion, or hydronephrosis. Bladder is unremarkable. Stomach/Bowel: There is sigmoid diverticulosis without active inflammatory changes. There is moderate amount of stool throughout the colon. No bowel obstruction or active inflammation. The cecum is mobile and located in the left upper abdomen. There is no evidence of twisting or obstruction. Appendectomy. Vascular/Lymphatic: The abdominal aorta and IVC are unremarkable on this noncontrast CT. No portal venous gas. There is no adenopathy. Reproductive: Hysterectomy. No pelvic mass. Other: None Musculoskeletal: Osteopenia with degenerative changes of the spine. L4-L5 posterior fusion. No acute osseous pathology. IMPRESSION: 1. Cluster of nodular density at the left lung base  concerning for an infectious process. Clinical correlation and follow-up recommended. 2. Moderate colonic stool burden. No bowel obstruction or active inflammation. 3. Sigmoid diverticulosis. Electronically Signed   By: Elgie CollardArash  Radparvar M.D.   On: 09/25/2018 19:34   Dg Chest Portable 1 View  Result Date: 09/25/2018 CLINICAL DATA:  Fevers, headache, and vomiting. Assessment for COVID-19. EXAM: PORTABLE CHEST 1 VIEW  COMPARISON:  05/22/2018 FINDINGS: The cardiomediastinal silhouette is grossly unchanged allowing for rightward patient rotation and magnification from portable AP technique. No airspace consolidation, edema, pleural effusion, or pneumothorax is identified. Mild lower thoracic levoscoliosis is again noted. IMPRESSION: No active disease. Electronically Signed   By: Sebastian AcheAllen  Grady M.D.   On: 09/25/2018 17:26    Procedures Procedures (including critical care time)  Medications Ordered in ED Medications  metoCLOPramide (REGLAN) injection 5 mg (5 mg Intravenous Given 09/25/18 1745)  sodium chloride 0.9 % bolus 1,000 mL (1,000 mLs Intravenous New Bag/Given (Non-Interop) 09/25/18 1745)     Initial Impression / Assessment and Plan / ED Course  I have reviewed the triage vital signs and the nursing notes.  Pertinent labs & imaging results that were available during my care of the patient were reviewed by me and considered in my medical decision making (see chart for details).        63 year old female presents to the ED for documented history above.  Patient appears ill on exam.  Although her vital signs are reassuring.  She is afebrile.  No significant hypotension or tachycardia noted.  Patient is not hypoxic.  Mild tachypnea noted.  Lungs with mild crackles in the left lower lobe.  Patient has heart regular rate and rhythm.  Does have generalized abdominal pain to palpation but no signs of peritonitis.  Bowel sounds present in all 4 quadrant.  No CVA tenderness.  Neurovascular intact in all extremities.  Overall labs reassuring.  No leukocytosis.  Hemoglobin at baseline.  No significant electrolyte derangement.  Mild elevation in creatinine 1.63 similar to baseline.  No significant electrolyte derangement.  Lipase is normal.  Patient troponin was negative.  COVID test negative.  Chest x-ray performed shows no signs of focal infiltrate concerning for pneumonia as reviewed by myself.  EKG performed  shows normal sinus rhythm heart at 67 bpm with T wave in leads V1 and V2 similar to prior.  No signs of acute ST elevation depression with normal PR, QRS and QT interval at this was reviewed myself.  Given patient's tender to palpation of the abdomen and nausea and vomiting and diarrhea for 2 weeks imaging was ordered.  CT imaging shows no acute signs of intra-abdominal infection.  However does note some nodularity in the left lower lung base concerning for infectious process.  Patient given fluids and Reglan in the ED.  Nausea improved however fill given patient's elevation in creatinine with concerning signs of pneumonia on CT scan she would benefit from hospitalization with IV antibiotics given that she is failed outpatient doxycycline and Zofran.  Started on Rocephin and azithromycin.  Will admit to hospital medicine for further management.  Spoke with Dr. Julian ReilGardner with hospital medicine who accepts patient for admission.  Appreciate his recommendations.  Final Clinical Impressions(s) / ED Diagnoses   Final diagnoses:  Community acquired pneumonia of left lower lobe of lung (HCC)  Nausea vomiting and diarrhea    ED Discharge Orders    None       Wallace KellerLeaphart, Odeth Bry T, [name] 09/25/18 2057    Tegeler, Canary Brimhristopher J, MD  09/25/18 2232 ° °

## 2018-09-25 NOTE — ED Notes (Signed)
ED TO INPATIENT HANDOFF REPORT  ED Nurse Name and Phone #: Terrilyn Saver RN [phone]  S Name/Age/Gender Jennifer Davidson 63 y.o. female Room/Bed: WA09/WA09  Code Status   Code Status: Full Code  Home/SNF/Other Home Patient oriented to: self, place, time and situation Is this baseline? Yes   Triage Complete: Triage complete  Chief Complaint emesis/ fever   Triage Note Pt c/o fevers, headache and vomiting for over week. Denies taking any OTC meds for fevers today, afebrile here.    Allergies Allergies  Allergen Reactions  . Tramadol Nausea And Vomiting  . Trazodone And Nefazodone Other (See Comments)    Per pt trazodone caused hallucinations and behavior changes     Level of Care/Admitting Diagnosis ED Disposition    ED Disposition Condition Comment   Admit  Hospital Area: [facility] [[id number]]  Level of Care: Med-Surg [16]  Covid Evaluation: Screening Protocol (No Symptoms)  Diagnosis: Community acquired pneumonia of left lower lobe of lung [facility]) [[id number]]  Admitting Physician: Wyvonnia Dusky  Attending Physician: Hillary Bow [phone]  Estimated length of stay: past midnight tomorrow  Certification:: I certify this patient will need inpatient services for at least 2 midnights  PT Class (Do Not Modify): Inpatient [101]  PT Acc Code (Do Not Modify): Private [1]       B Medical/Surgery History Past Medical History:  Diagnosis Date  . Acute encephalopathy 06/25/2016  . Anxiety   . Cataract 08/31/2017   left eye  . Cholesterol serum increased   . Chronic back pain    chronic Rt low back pain. s/p L4-5 fusion. failed Rt facet injections. poss due to Rt SI joint dysfunction.  . Chronic kidney disease   . Depression   . Diastolic heart failure (HCC)   . GERD (gastroesophageal reflux disease)   . Hyperparathyroidism (HCC)   . Hypertension   . Hypothyroidism   . Migraine   . Neuromuscular disorder (HCC)   . Sleep apnea   .  Syncope 08/30/2015  . Tachycardia   . Trochanteric bursitis of both hips 2012   Confirmed on MRI   Past Surgical History:  Procedure Laterality Date  . ABDOMINAL HYSTERECTOMY    . APPENDECTOMY  1986  . BACK SURGERY     L4-5 fusion  . CATARACT EXTRACTION    . KNEE SURGERY       A IV Location/Drains/Wounds Patient Lines/Drains/Airways Status   Active Line/Drains/Airways    Name:   Placement date:   Placement time:   Site:   Days:   Peripheral IV 09/25/18 Right;Upper Arm   09/25/18    2151    Arm   less than 1          Intake/Output Last 24 hours No intake or output data in the 24 hours ending 09/25/18 2240  Labs/Imaging Results for orders placed or performed during the hospital encounter of 09/25/18 (from the past 48 hour(s))  Urinalysis, Routine w reflex microscopic     Status: Abnormal   Collection Time: 09/25/18  1:45 PM  Result Value Ref Range   Color, Urine STRAW (A) YELLOW   APPearance CLEAR CLEAR   Specific Gravity, Urine 1.006 1.005 - 1.030   pH 7.0 5.0 - 8.0   Glucose, UA NEGATIVE NEGATIVE mg/dL   Hgb urine dipstick NEGATIVE NEGATIVE   Bilirubin Urine NEGATIVE NEGATIVE   Ketones, ur NEGATIVE NEGATIVE mg/dL   Protein, ur NEGATIVE NEGATIVE mg/dL   Nitrite NEGATIVE NEGATIVE  Leukocytes,Ua NEGATIVE NEGATIVE    Comment: Performed at [facility], [address] [place], [place] [address]  Lipase, blood     Status: None   Collection Time: 09/25/18  3:28 PM  Result Value Ref Range   Lipase 25 11 - 51 U/L    Comment: Performed at [facility], [address] [place], [place] [address]  Comprehensive metabolic panel     Status: Abnormal   Collection Time: 09/25/18  3:28 PM  Result Value Ref Range   Sodium 139 135 - 145 mmol/L   Potassium 4.0 3.5 - 5.1 mmol/L   Chloride 99 98 - 111 mmol/L   CO2 29 22 - 32 mmol/L   Glucose, Bld 110 (H) 70 - 99 mg/dL   BUN 22 8 - 23 mg/dL   Creatinine, Ser 0.981.63 (H) 0.44 - 1.00 mg/dL    Calcium 9.0 8.9 - 11.910.3 mg/dL   Total Protein 7.0 6.5 - 8.1 g/dL   Albumin 3.7 3.5 - 5.0 g/dL   AST 29 15 - 41 U/L   ALT 20 0 - 44 U/L   Alkaline Phosphatase 74 38 - 126 U/L   Total Bilirubin 0.4 0.3 - 1.2 mg/dL   GFR calc non Af Amer 33 (L) >60 mL/min   GFR calc Af Amer 38 (L) >60 mL/min   Anion gap 11 5 - 15    Comment: Performed at [facility], [address] [place], [place] [address]  CBC     Status: Abnormal   Collection Time: 09/25/18  3:28 PM  Result Value Ref Range   WBC 7.5 4.0 - 10.5 K/uL   RBC 4.35 3.87 - 5.11 MIL/uL   Hemoglobin 11.8 (L) 12.0 - 15.0 g/dL   HCT 95.637.1 21.336.0 - 08.646.0 %   MCV 85.3 80.0 - 100.0 fL   MCH 27.1 26.0 - 34.0 pg   MCHC 31.8 30.0 - 36.0 g/dL   RDW 57.815.0 46.911.5 - 62.915.5 %   Platelets 251 150 - 400 K/uL   nRBC 0.0 0.0 - 0.2 %    Comment: Performed at [facility], [address] [place], [place] [address]  SARS Coronavirus 2 (CEPHEID- Performed in [facility] hospital lab), Hosp Order     Status: None   Collection Time: 09/25/18  4:37 PM  Result Value Ref Range   SARS Coronavirus 2 NEGATIVE NEGATIVE    Comment: (NOTE) If result is NEGATIVE SARS-CoV-2 target nucleic acids are NOT DETECTED. The SARS-CoV-2 RNA is generally detectable in upper and lower  respiratory specimens during the acute phase of infection. The lowest  concentration of SARS-CoV-2 viral copies this assay can detect is 250  copies / mL. A negative result does not preclude SARS-CoV-2 infection  and should not be used as the sole basis for treatment or other  patient management decisions.  A negative result may occur with  improper specimen collection / handling, submission of specimen other  than nasopharyngeal swab, presence of viral mutation(s) within the  areas targeted by this assay, and inadequate number of viral copies  (<250 copies / mL). A negative result must be combined with clinical  observations, patient history, and epidemiological  information. If result is POSITIVE SARS-CoV-2 target nucleic acids are DETECTED. The SARS-CoV-2 RNA is generally detectable in upper and lower  respiratory specimens dur ing the acute phase of infection.  Positive  results are indicative of active infection with SARS-CoV-2.  Clinical  correlation with patient  history and other diagnostic information is  necessary to determine patient infection status.  Positive results do  not rule out bacterial infection or co-infection with other viruses. If result is PRESUMPTIVE POSTIVE SARS-CoV-2 nucleic acids MAY BE PRESENT.   A presumptive positive result was obtained on the submitted specimen  and confirmed on repeat testing.  While 2019 novel coronavirus  (SARS-CoV-2) nucleic acids may be present in the submitted sample  additional confirmatory testing may be necessary for epidemiological  and / or clinical management purposes  to differentiate between  SARS-CoV-2 and other Sarbecovirus currently known to infect humans.  If clinically indicated additional testing with an alternate test  methodology [phone]) is advised. The SARS-CoV-2 RNA is generally  detectable in upper and lower respiratory sp ecimens during the acute  phase of infection. The expected result is Negative. Fact Sheet for Patients:  BoilerBrush.com.cy Fact Sheet for Healthcare Providers: https://pope.com/ This test is not yet approved or cleared by the [place] FDA and has been authorized for detection and/or diagnosis of SARS-CoV-2 by FDA under an Emergency Use Authorization (EUA).  This EUA will remain in effect (meaning this test can be used) for the duration of the COVID-19 declaration under Section 564(b)(1) of the Act, 21 U.S.C. section 360bbb-3(b)(1), unless the authorization is terminated or revoked sooner. Performed at [facility], [address], [place], [place] [address]   I-Stat Troponin,  ED (not at [facility])     Status: None   Collection Time: 09/25/18  5:51 PM  Result Value Ref Range   Troponin i, poc 0.00 0.00 - 0.08 ng/mL   Comment 3            Comment: Due to the release kinetics of cTnI, a negative result within the first hours of the onset of symptoms does not rule out myocardial infarction with certainty. If myocardial infarction is still suspected, repeat the test at appropriate intervals.    Ct Abdomen Pelvis Wo Contrast  Result Date: 09/25/2018 CLINICAL DATA:  63 year old female with abdominal pain. Concern for gastroenteritis or colitis. EXAM: CT ABDOMEN AND PELVIS WITHOUT CONTRAST TECHNIQUE: Multidetector CT imaging of the abdomen and pelvis was performed following the standard protocol without IV contrast. COMPARISON:  CT of the abdomen pelvis dated 09/16/2017 FINDINGS: Evaluation of this exam is limited in the absence of intravenous contrast. Lower chest: There is a cluster of nodular density at the left lung base concerning for an infectious process. No intra-abdominal free air or free fluid. Hepatobiliary: No focal liver abnormality is seen. No gallstones, gallbladder wall thickening, or biliary dilatation. Pancreas: Unremarkable. No pancreatic ductal dilatation or surrounding inflammatory changes. Spleen: Normal in size without focal abnormality. Adrenals/Urinary Tract: Adrenal glands are unremarkable. Kidneys are normal, without renal calculi, focal lesion, or hydronephrosis. Bladder is unremarkable. Stomach/Bowel: There is sigmoid diverticulosis without active inflammatory changes. There is moderate amount of stool throughout the colon. No bowel obstruction or active inflammation. The cecum is mobile and located in the left upper abdomen. There is no evidence of twisting or obstruction. Appendectomy. Vascular/Lymphatic: The abdominal aorta and IVC are unremarkable on this noncontrast CT. No portal venous gas. There is no adenopathy. Reproductive: Hysterectomy. No pelvic  mass. Other: None Musculoskeletal: Osteopenia with degenerative changes of the spine. L4-L5 posterior fusion. No acute osseous pathology. IMPRESSION: 1. Cluster of nodular density at the left lung base concerning for an infectious process. Clinical correlation and follow-up recommended. 2. Moderate colonic stool burden. No bowel obstruction or active inflammation. 3.  Sigmoid diverticulosis. Electronically Signed   By: Elgie Collard M.D.   On: 09/25/2018 19:34   Dg Chest Portable 1 View  Result Date: 09/25/2018 CLINICAL DATA:  Fevers, headache, and vomiting. Assessment for COVID-19. EXAM: PORTABLE CHEST 1 VIEW COMPARISON:  05/22/2018 FINDINGS: The cardiomediastinal silhouette is grossly unchanged allowing for rightward patient rotation and magnification from portable AP technique. No airspace consolidation, edema, pleural effusion, or pneumothorax is identified. Mild lower thoracic levoscoliosis is again noted. IMPRESSION: No active disease. Electronically Signed   By: Sebastian Ache M.D.   On: 09/25/2018 17:26    Pending Labs Unresulted Labs (From admission, onward)    Start     Ordered   09/26/18 0500  CBC  Tomorrow morning,   R     09/25/18 2100   09/26/18 0500  Basic metabolic panel  Tomorrow morning,   R     09/25/18 2100   09/25/18 2053  Culture, blood (routine x 2) Call MD if unable to obtain prior to antibiotics being given  BLOOD CULTURE X 2,   R    Comments:  If blood cultures drawn in Emergency Department - Do not draw and cancel order    09/25/18 2100   09/25/18 2053  Culture, sputum-assessment  Once,   R     09/25/18 2100   09/25/18 2053  Gram stain  Once,   R     09/25/18 2100   09/25/18 2053  HIV antibody (Routine Screening)  Once,   R     09/25/18 2100   09/25/18 2053  Strep pneumoniae urinary antigen  Once,   R     09/25/18 2100          Vitals/Pain Today's Vitals   09/25/18 1800 09/25/18 1958 09/25/18 2045 09/25/18 2130  BP: 130/74 122/74 118/68 (!) 107/91  Pulse:  71 70 72 71  Resp: 12 15 13  (!) 27  Temp:  97.8 F (36.6 C)    TempSrc:  Oral    SpO2: 98% 96% 94% 97%  PainSc:        Isolation Precautions Droplet and Contact precautions  Medications Medications  azithromycin (ZITHROMAX) 500 mg in sodium chloride 0.9 % 250 mL IVPB (500 mg Intravenous New Bag/Given 09/25/18 2212)  0.9 %  sodium chloride infusion (500 mLs Intravenous New Bag/Given 09/25/18 1953)  benzonatate (TESSALON) capsule 100 mg (has no administration in time range)  busPIRone (BUSPAR) tablet 5 mg (has no administration in time range)  diclofenac sodium (VOLTAREN) 1 % transdermal gel 2 g (has no administration in time range)  DULoxetine (CYMBALTA) DR capsule 60 mg (has no administration in time range)  pantoprazole (PROTONIX) EC tablet 80 mg (has no administration in time range)  fluticasone (FLONASE) 50 MCG/ACT nasal spray 1 spray (has no administration in time range)  gabapentin (NEURONTIN) tablet 600 mg (has no administration in time range)  hydrOXYzine (ATARAX/VISTARIL) tablet 50 mg (has no administration in time range)  QUEtiapine (SEROQUEL) tablet 50 mg (has no administration in time range)  prazosin (MINIPRESS) capsule 2 mg (has no administration in time range)  prazosin (MINIPRESS) capsule 5 mg (has no administration in time range)  meclizine (ANTIVERT) tablet 25 mg (has no administration in time range)  levothyroxine (SYNTHROID) tablet 50 mcg (has no administration in time range)  rizatriptan (MAXALT-MLT) disintegrating tablet 10 mg (has no administration in time range)  enoxaparin (LOVENOX) injection 40 mg (has no administration in time range)  cefTRIAXone (ROCEPHIN) 1 g in sodium chloride 0.9 %  100 mL IVPB (has no administration in time range)  azithromycin (ZITHROMAX) tablet 500 mg (has no administration in time range)  0.9 %  sodium chloride infusion (has no administration in time range)  polyethylene glycol (MIRALAX / GLYCOLAX) packet 17 g (has no administration in  time range)  metoCLOPramide (REGLAN) injection 5 mg (5 mg Intravenous Given 09/25/18 1745)  sodium chloride 0.9 % bolus 1,000 mL (0 mLs Intravenous Stopped 09/25/18 1948)  cefTRIAXone (ROCEPHIN) 1 g in sodium chloride 0.9 % 100 mL IVPB (0 g Intravenous Stopped 09/25/18 2111)    Mobility walks High fall risk   Focused Assessments Pulmonary    R Recommendations: See Admitting Provider Note  Report given to: Dawn RN   Additional Notes: N/A

## 2018-09-25 NOTE — ED Triage Notes (Signed)
Pt c/o fevers, headache and vomiting for over week. Denies taking any OTC meds for fevers today, afebrile here.

## 2018-09-25 NOTE — ED Notes (Signed)
Pt aware that a urine sample is needed.  Pt unable at this time. 

## 2018-09-25 NOTE — Progress Notes (Signed)
CC- cough and diarrhea f/u- Have been running a slight fever for 3 day. (temp 101.9-99.3) But the fever,diarrhea, and cough is better. Still have the cough not as bad since started the medication for the cough perscribed by Dr Neva Seat.

## 2018-09-25 NOTE — ED Notes (Signed)
Karleen Hampshire, RN attempted two ultrasound IV's, unsuccessful. Ilene, RN is attempting.

## 2018-09-25 NOTE — ED Notes (Signed)
Attempted to call report, Dawn RN is going to call back for report.

## 2018-09-26 ENCOUNTER — Inpatient Hospital Stay (HOSPITAL_COMMUNITY): Payer: Medicare HMO

## 2018-09-26 LAB — BASIC METABOLIC PANEL
Anion gap: 11 (ref 5–15)
BUN: 19 mg/dL (ref 8–23)
CO2: 22 mmol/L (ref 22–32)
Calcium: 8.6 mg/dL — ABNORMAL LOW (ref 8.9–10.3)
Chloride: 106 mmol/L (ref 98–111)
Creatinine, Ser: 1.52 mg/dL — ABNORMAL HIGH (ref 0.44–1.00)
GFR calc Af Amer: 42 mL/min — ABNORMAL LOW (ref >60)
GFR calc non Af Amer: 36 mL/min — ABNORMAL LOW (ref >60)
Glucose, Bld: 138 mg/dL — ABNORMAL HIGH (ref 70–99)
Potassium: 3.4 mmol/L — ABNORMAL LOW (ref 3.5–5.1)
Sodium: 139 mmol/L (ref 135–145)

## 2018-09-26 LAB — CBC
HCT: 38.4 % (ref 36.0–46.0)
Hemoglobin: 11.5 g/dL — ABNORMAL LOW (ref 12.0–15.0)
MCH: 25.9 pg — ABNORMAL LOW (ref 26.0–34.0)
MCHC: 29.9 g/dL — ABNORMAL LOW (ref 30.0–36.0)
MCV: 86.5 fL (ref 80.0–100.0)
Platelets: 227 10*3/uL (ref 150–400)
RBC: 4.44 MIL/uL (ref 3.87–5.11)
RDW: 15.2 % (ref 11.5–15.5)
WBC: 9.6 10*3/uL (ref 4.0–10.5)
nRBC: 0 % (ref 0.0–0.2)

## 2018-09-26 LAB — STREP PNEUMONIAE URINARY ANTIGEN: Strep Pneumo Urinary Antigen: NEGATIVE

## 2018-09-26 MED ORDER — GUAIFENESIN-DM 100-10 MG/5ML PO SYRP: 5.0000 mL | ORAL_SOLUTION | ORAL | Status: DC | PRN

## 2018-09-26 MED ORDER — POLYETHYLENE GLYCOL 3350 17 G PO PACK
17.0000 g | PACK | Freq: Two times a day (BID) | ORAL | Status: DC
  Administered 2018-09-27 (×2): 17 g via ORAL
  Filled 2018-09-26 (×2): qty 1

## 2018-09-26 MED ORDER — ONDANSETRON HCL 4 MG/2ML IJ SOLN
4.0000 mg | Freq: Four times a day (QID) | INTRAMUSCULAR | Status: DC | PRN
  Administered 2018-09-26: 4 mg via INTRAVENOUS
  Filled 2018-09-26 (×2): qty 2

## 2018-09-26 MED ORDER — PANTOPRAZOLE SODIUM 40 MG PO TBEC
80.0000 mg | DELAYED_RELEASE_TABLET | Freq: Every day | ORAL | Status: DC
  Administered 2018-09-26 – 2018-09-27 (×2): 80 mg via ORAL
  Filled 2018-09-26 (×2): qty 2

## 2018-09-26 MED ORDER — ACETAMINOPHEN 325 MG PO TABS
650.0000 mg | ORAL_TABLET | Freq: Four times a day (QID) | ORAL | Status: DC | PRN
  Administered 2018-09-26: 650 mg via ORAL
  Filled 2018-09-26: qty 2

## 2018-09-26 NOTE — Progress Notes (Addendum)
PROGRESS NOTE    Jennifer Davidson  UXN:[id number] DOB: [birth year] DOA: 09/25/2018 PCP: Myles Lipps, MD    Brief Narrative:  63 year old female who presented with cough, fever and vomiting.  She does have significant past medical history for chronic kidney disease, chronic back pain and depression.  Patient reported 2-week history of nausea, vomiting, diarrhea generalized weakness and cough.  Symptoms associated with fever.  Symptoms were refractive to outpatient therapy with doxycycline for 7 days.  On her initial physical examination her blood pressure was 125/76, heart rate 70, respiratory rate 16, temperature 98.1, oxygen saturation 95%.  She had moist mucous membranes, lungs were clear to auscultation bilaterally, heart S1-S2 present and rhythmic, abdomen was soft nontender, no lower extremity edema.  Sodium 139, potassium 4.0, chloride 99, bicarb 29, glucose 110, BUN 22, creatinine 1.60, white count 7.5, hemoglobin 11.8, hematocrit 37.1, platelets 251.  SARS COVID-19 negative.  Urine analysis negative for infection.  Chest radiograph with faint left base atelectasis, CT of abdomen with increased stool burden.  Cluster of nodular density in the left lung base concerning for infectious process.   Patient was admitted to the hospital with working diagnosis of left lower lobe ventricular pneumonia.  Assessment & Plan:   Principal Problem:   Community acquired pneumonia of left lower lobe of lung (HCC) Active Problems:   Chronic kidney disease   Hypothyroid   Chronic back pain   Major depression, recurrent, chronic (HCC)   1. Febrile syndrome to rule out left lower lobe pneumonia. Her chest film, personally reviewed has a questionable infiltrate on the left lower lobe, retrocardiac. Will follow on chest film 2 views, today. Continue oxymetry monitoring and antibiotic therapy. If no relevant infiltrate to consider discontinue antibiotic therapy.   2. Constipation with increased stool burden.  Will continue bowel regime with bid miralax. Patient had positive bowel movement today with improvement in her symptoms.   3. CKD stage 3. Renal function wit stable renal function, serum cr at 1,52 with K at 3,4 and serum bicarbonate at 22. Will follow on renal function and electrolytes. Tolerating po well, will hold on IV fluids for now.   4. Hypothyroid. Continue levothyroxine.   5. Obesity. BMI is 38,7. Will need close follow up as outpatient.   6. Depression. Continue quetiapine and duloxetine.    DVT prophylaxis: enoxaparin   Code Status:  full Family Communication: no family at the bedside  Disposition Plan/ discharge barriers: pending clinical improvement.  Body mass index is 38.7 kg/m. Malnutrition Type:      Malnutrition Characteristics:      Nutrition Interventions:     RN Pressure Injury Documentation:     Consultants:     Procedures:     Antimicrobials:   Ceftriaxone   Azithromycin     Subjective: Improved cough, no chest pain, no nausea or vomiting. Positive bowel movement this am.   Objective: Vitals:   09/25/18 2130 09/25/18 2307 09/25/18 2318 09/26/18 0446  BP: (!) 107/91 134/74  126/60  Pulse: 71 71  73  Resp: (!) 27 20  14   Temp:  98.6 F (37 C)  97.8 F (36.6 C)  TempSrc:    Oral  SpO2: 97% 97%  94%  Weight:   99.1 kg   Height:   5\' 3"  (1.6 m)     Intake/Output Summary (Last 24 hours) at 09/26/2018 1142 Last data filed at 09/26/2018 0700 Gross per 24 hour  Intake 1026.53 ml  Output 1000 ml  Net  26.53 ml   Filed Weights   09/25/18 2318  Weight: 99.1 kg    Examination:   General: Not in pain or dyspnea Neurology: Awake and alert, non focal  E ENT: mild pallor, no icterus, oral mucosa moist Cardiovascular: No JVD. S1-S2 present, rhythmic, no gallops, rubs, or murmurs. No lower extremity edema. Pulmonary: positive breath sounds bilaterally, adequate air movement, no wheezing, rhonchi, mild scattered bilateral rales.  Gastrointestinal. Abdomen protuberant with no organomegaly, non tender, no rebound or guarding Skin. No rashes Musculoskeletal: no joint deformities/ patient has a sling on the right upper extremity.      Data Reviewed: I have personally reviewed following labs and imaging studies  CBC: Recent Labs  Lab 09/25/18 1528 09/26/18 0047  WBC 7.5 9.6  HGB 11.8* 11.5*  HCT 37.1 38.4  MCV 85.3 86.5  PLT 251 227   Basic Metabolic Panel: Recent Labs  Lab 09/25/18 1528 09/26/18 0047  NA 139 139  K 4.0 3.4*  CL 99 106  CO2 29 22  GLUCOSE 110* 138*  BUN 22 19  CREATININE 1.63* 1.52*  CALCIUM 9.0 8.6*   GFR: Estimated Creatinine Clearance: 42 mL/min (A) (by C-G formula based on SCr of 1.52 mg/dL (H)). Liver Function Tests: Recent Labs  Lab 09/25/18 1528  AST 29  ALT 20  ALKPHOS 74  BILITOT 0.4  PROT 7.0  ALBUMIN 3.7   Recent Labs  Lab 09/25/18 1528  LIPASE 25   No results for input(s): AMMONIA in the last 168 hours. Coagulation Profile: No results for input(s): INR, PROTIME in the last 168 hours. Cardiac Enzymes: No results for input(s): CKTOTAL, CKMB, CKMBINDEX, TROPONINI in the last 168 hours. BNP (last 3 results) No results for input(s): PROBNP in the last 8760 hours. HbA1C: No results for input(s): HGBA1C in the last 72 hours. CBG: No results for input(s): GLUCAP in the last 168 hours. Lipid Profile: No results for input(s): CHOL, HDL, LDLCALC, TRIG, CHOLHDL, LDLDIRECT in the last 72 hours. Thyroid Function Tests: No results for input(s): TSH, T4TOTAL, FREET4, T3FREE, THYROIDAB in the last 72 hours. Anemia Panel: No results for input(s): VITAMINB12, FOLATE, FERRITIN, TIBC, IRON, RETICCTPCT in the last 72 hours.    Radiology Studies: I have reviewed all of the imaging during this hospital visit personally     Scheduled Meds: . azithromycin  500 mg Oral Q24H  . busPIRone  5 mg Oral TID  . DULoxetine  60 mg Oral Daily  . enoxaparin (LOVENOX)  injection  40 mg Subcutaneous QHS  . fluticasone  1 spray Each Nare BID  . gabapentin  600 mg Oral TID  . levothyroxine  50 mcg Oral QAC breakfast  . pantoprazole  80 mg Oral Q1200  . prazosin  4 mg Oral QHS  . prazosin  5 mg Oral QHS  . QUEtiapine  50 mg Oral QHS   Continuous Infusions: . sodium chloride 500 mL (09/25/18 1953)  . sodium chloride 125 mL/hr at 09/26/18 0442  . cefTRIAXone (ROCEPHIN)  IV       LOS: 1 day        Eligh Rybacki Annett Gulaaniel Thessaly Mccullers, MD

## 2018-09-27 DIAGNOSIS — J069 Acute upper respiratory infection, unspecified: Secondary | ICD-10-CM

## 2018-09-27 HISTORY — DX: Acute upper respiratory infection, unspecified: J06.9

## 2018-09-27 LAB — BASIC METABOLIC PANEL
Anion gap: 6 (ref 5–15)
BUN: 18 mg/dL (ref 8–23)
CO2: 27 mmol/L (ref 22–32)
Calcium: 8.7 mg/dL — ABNORMAL LOW (ref 8.9–10.3)
Chloride: 105 mmol/L (ref 98–111)
Creatinine, Ser: 1.49 mg/dL — ABNORMAL HIGH (ref 0.44–1.00)
GFR calc Af Amer: 43 mL/min — ABNORMAL LOW (ref >60)
GFR calc non Af Amer: 37 mL/min — ABNORMAL LOW (ref >60)
Glucose, Bld: 108 mg/dL — ABNORMAL HIGH (ref 70–99)
Potassium: 3.8 mmol/L (ref 3.5–5.1)
Sodium: 138 mmol/L (ref 135–145)

## 2018-09-27 LAB — CBC WITH DIFFERENTIAL/PLATELET
Abs Immature Granulocytes: 0.08 10*3/uL — ABNORMAL HIGH (ref 0.00–0.07)
Basophils Absolute: 0.1 10*3/uL (ref 0.0–0.1)
Basophils Relative: 1 %
Eosinophils Absolute: 0.3 10*3/uL (ref 0.0–0.5)
Eosinophils Relative: 4 %
HCT: 33.5 % — ABNORMAL LOW (ref 36.0–46.0)
Hemoglobin: 10.2 g/dL — ABNORMAL LOW (ref 12.0–15.0)
Immature Granulocytes: 1 %
Lymphocytes Relative: 45 %
Lymphs Abs: 3.2 10*3/uL (ref 0.7–4.0)
MCH: 26.3 pg (ref 26.0–34.0)
MCHC: 30.4 g/dL (ref 30.0–36.0)
MCV: 86.3 fL (ref 80.0–100.0)
Monocytes Absolute: 0.6 10*3/uL (ref 0.1–1.0)
Monocytes Relative: 9 %
Neutro Abs: 2.8 10*3/uL (ref 1.7–7.7)
Neutrophils Relative %: 40 %
Platelets: 208 10*3/uL (ref 150–400)
RBC: 3.88 MIL/uL (ref 3.87–5.11)
RDW: 15.1 % (ref 11.5–15.5)
WBC: 7.1 10*3/uL (ref 4.0–10.5)
nRBC: 0 % (ref 0.0–0.2)

## 2018-09-27 LAB — EXPECTORATED SPUTUM ASSESSMENT W GRAM STAIN, RFLX TO RESP C

## 2018-09-27 LAB — HIV ANTIBODY (ROUTINE TESTING W REFLEX): HIV Screen 4th Generation wRfx: NONREACTIVE

## 2018-09-27 LAB — EXPECTORATED SPUTUM ASSESSMENT W REFEX TO RESP CULTURE

## 2018-09-27 MED ORDER — GUAIFENESIN-DM 100-10 MG/5ML PO SYRP: 5.0000 mL | ORAL_SOLUTION | ORAL | Status: DC | PRN

## 2018-09-27 NOTE — Progress Notes (Signed)
Patient discharged to home w/ family. Given all belongings, instructions. Verbalized understanding of all instructions. Escorted to pov via w/c. 

## 2018-09-27 NOTE — Discharge Instructions (Signed)

## 2018-09-27 NOTE — Discharge Summary (Signed)
Physician Discharge Summary  Gillis SantaSusie G Legault ZOX:[id number] DOB: 09/25/1955 DOA: 09/25/2018  PCP: Myles LippsSantiago, Irma M, MD  Admit date: 09/25/2018 Discharge date: 09/27/2018  Admitted From: Home Disposition: Home  Recommendations for Outpatient Follow-up:  1. Follow up with PCP in 1-2 weeks 2. Please obtain BMP/CBC in one week 3. Please follow up on the following pending results:  Home Health: None Equipment/Devices: None  Discharge Condition: Stable CODE STATUS: Full code Diet recommendation: Heart healthy  Subjective: Patient seen and examined.  She has no complaints.  Denies any fever, chills, sweating or shortness of breath.  She wants me to discharge her right away because her husband goes to work at 1 PM and then nobody will be there to pick her up.  Brief/Interim Summary: 63 year old female who presented with cough, fever and vomiting.  She does have significant past medical history for chronic kidney disease, chronic back pain and depression.  Patient reported 2-week history of nausea, vomiting, diarrhea generalized weakness and cough.  Symptoms associated with fever.  Symptoms were refractive to outpatient therapy with doxycycline for 7 days.  On her initial physical examination her blood pressure was 125/76, heart rate 70, respiratory rate 16, temperature 98.1, oxygen saturation 95%.  She had moist mucous membranes, lungs were clear to auscultation bilaterally, heart S1-S2 present and rhythmic, abdomen was soft nontender, no lower extremity edema.  Sodium 139, potassium 4.0, chloride 99, bicarb 29, glucose 110, BUN 22, creatinine 1.60, white count 7.5, hemoglobin 11.8, hematocrit 37.1, platelets 251.  SARS COVID-19 negative.  Urine analysis negative for infection.  Chest radiograph with faint left base atelectasis, CT of abdomen with increased stool burden.  Cluster of nodular density in the left lung base concerning for infectious process.   She was admitted to hospital service with working  diagnosis of left lower lobe pneumonia and she was started on broad-spectrum antibiotics.  She did not have any leukocytosis and she remained afebrile with no hypoxia at all.  There was high suspicion for erronous readings on the imaging study so repeat chest x-ray with two-view was done which did not show any consolidation or infiltrates.  Based on this, patient does not seem to have bacterial pneumonia however she may have had upper respiratory viral infection.  I had offered the patient to check respiratory virus panel which would take few hours however patient was very adamant on discharging her right away so she can be picked up by her husband.  That is quite reasonable as the management will not be different even if she is positive for 1 of those viruses however I have asked her to maintain some sort of isolation or quarantine even at home.  She verbalizes understanding.  She is going to be discharged since she is a stable.   Discharge Diagnoses:  Active Problems:   Chronic kidney disease   Hypothyroid   Chronic back pain   Major depression, recurrent, chronic (HCC)   URI (upper respiratory infection)    Discharge Instructions  Discharge Instructions    Discharge patient   Complete by:  As directed    Discharge disposition:  01-Home or Self Care   Discharge patient date:  09/27/2018     Allergies as of 09/27/2018      Reactions   Tramadol Nausea And Vomiting   Trazodone And Nefazodone Other (See Comments)   Per pt trazodone caused hallucinations and behavior changes      Medication List    TAKE these medications   benzonatate 100  MG capsule Commonly known as:  TESSALON Take 1 capsule (100 mg total) by mouth 3 (three) times daily as needed for cough.   busPIRone 5 MG tablet Commonly known as:  BUSPAR Take 1 tablet (5 mg total) by mouth 3 (three) times daily. May take 4th dose in evening for anxiety exacerbation   diclofenac sodium 1 % Gel Commonly known as:   VOLTAREN Apply 2 g topically daily as needed (for knee pain).   DULoxetine 60 MG capsule Commonly known as:  CYMBALTA Take 1 capsule (60 mg total) by mouth daily.   esomeprazole 40 MG capsule Commonly known as:  NEXIUM TAKE 1 CAPSULE EVERY DAY   fluticasone 50 MCG/ACT nasal spray Commonly known as:  FLONASE PLACE 2 SPRAYS AT BEDTIME INTO BOTH NOSTRILS. What changed:    how much to take  how to take this  when to take this  additional instructions   gabapentin 600 MG tablet Commonly known as:  NEURONTIN Take 1 tablet (600 mg total) by mouth 3 (three) times daily.   hydrOXYzine 50 MG tablet Commonly known as:  ATARAX/VISTARIL Take 1 tablet (50 mg total) by mouth every 6 (six) hours as needed. What changed:  reasons to take this   levothyroxine 50 MCG tablet Commonly known as:  SYNTHROID Take 1 tablet (50 mcg total) by mouth daily before breakfast.   meclizine 25 MG tablet Commonly known as:  ANTIVERT TAKE 1 TABLET BY MOUTH 3 TIMES DAILY AS NEEDED FOR DIZZINESS   Melatonin 10 MG Tabs Take 1 tablet by mouth daily.   ondansetron 8 MG disintegrating tablet Commonly known as:  ZOFRAN-ODT Take 0.5-1 tablets (4-8 mg total) by mouth every 8 (eight) hours as needed for nausea (from migraines).   prazosin 5 MG capsule Commonly known as:  MINIPRESS Take 2 capsules (10 mg total) by mouth at bedtime. TAKE 2 TO 3 CAPSULES AT BEDTIME   prazosin 2 MG capsule Commonly known as:  MINIPRESS TAKE 2 TABLETS BY MOUTH ALONG WITH THE  TABLET TO MAKE A TOTAL OF  DOSE AT BEDTIME   QUEtiapine 50 MG tablet Commonly known as:  SEROQUEL Take 1 tablet (50 mg total) by mouth at bedtime as needed. What changed:  when to take this   rizatriptan 10 MG disintegrating tablet Commonly known as:  MAXALT-MLT Take 1 tablet (10 mg total) by mouth as needed for migraine. May repeat in 2 hours if needed      Follow-up Information    Myles Lipps, MD Follow up in 1 week(s).    Specialty:  Family Medicine Contact information: [address] [place] [address] [phone]          Allergies  Allergen Reactions  . Tramadol Nausea And Vomiting  . Trazodone And Nefazodone Other (See Comments)    Per pt trazodone caused hallucinations and behavior changes     Consultations: None   Procedures/Studies: Ct Abdomen Pelvis Wo Contrast  Result Date: 09/25/2018 CLINICAL DATA:  63 year old female with abdominal pain. Concern for gastroenteritis or colitis. EXAM: CT ABDOMEN AND PELVIS WITHOUT CONTRAST TECHNIQUE: Multidetector CT imaging of the abdomen and pelvis was performed following the standard protocol without IV contrast. COMPARISON:  CT of the abdomen pelvis dated 09/16/2017 FINDINGS: Evaluation of this exam is limited in the absence of intravenous contrast. Lower chest: There is a cluster of nodular density at the left lung base concerning for an infectious process. No intra-abdominal free air or free fluid. Hepatobiliary: No focal liver abnormality is  seen. No gallstones, gallbladder wall thickening, or biliary dilatation. Pancreas: Unremarkable. No pancreatic ductal dilatation or surrounding inflammatory changes. Spleen: Normal in size without focal abnormality. Adrenals/Urinary Tract: Adrenal glands are unremarkable. Kidneys are normal, without renal calculi, focal lesion, or hydronephrosis. Bladder is unremarkable. Stomach/Bowel: There is sigmoid diverticulosis without active inflammatory changes. There is moderate amount of stool throughout the colon. No bowel obstruction or active inflammation. The cecum is mobile and located in the left upper abdomen. There is no evidence of twisting or obstruction. Appendectomy. Vascular/Lymphatic: The abdominal aorta and IVC are unremarkable on this noncontrast CT. No portal venous gas. There is no adenopathy. Reproductive: Hysterectomy. No pelvic mass. Other: None Musculoskeletal: Osteopenia with degenerative changes of the  spine. L4-L5 posterior fusion. No acute osseous pathology. IMPRESSION: 1. Cluster of nodular density at the left lung base concerning for an infectious process. Clinical correlation and follow-up recommended. 2. Moderate colonic stool burden. No bowel obstruction or active inflammation. 3. Sigmoid diverticulosis. Electronically Signed   By: Elgie Collard M.D.   On: 09/25/2018 19:34   Dg Chest 2 View  Result Date: 09/26/2018 CLINICAL DATA:  Cough and fever EXAM: CHEST - 2 VIEW COMPARISON:  Sep 25, 2018 FINDINGS: Lungs are clear. Heart size and pulmonary vascularity are normal. No adenopathy. No bone lesions. IMPRESSION: No edema or consolidation. Electronically Signed   By: Bretta Bang III M.D.   On: 09/26/2018 15:36   Ct Head Wo Contrast  Result Date: 09/07/2018 CLINICAL DATA:  Left periorbital soft tissue swelling and bruising since falling 2 weeks ago. Blurred vision with nausea and dizziness. EXAM: CT HEAD WITHOUT CONTRAST TECHNIQUE: Contiguous axial images were obtained from the base of the skull through the vertex without intravenous contrast. COMPARISON:  CT head 07/04/2017. FINDINGS: Brain: There is no evidence of acute intracranial hemorrhage, mass lesion, brain edema or extra-axial fluid collection. The ventricles and subarachnoid spaces are appropriately sized for age. There is no CT evidence of acute cortical infarction. Vascular:  No hyperdense vessel identified. Skull: Negative for fracture or focal lesion. Sinuses/Orbits: There is a left supraorbital hematoma measuring up to 2.6 cm on image 19/2. No orbital hematoma identified. There are postsurgical changes in both globes. The posterior ethmoid air cells are partially opacified on the left without definite air-fluid levels or facial fracture. The visualized paranasal sinuses, mastoid air cells and middle ears are otherwise clear. Other: There are degenerative changes of the temporomandibular joints bilaterally. IMPRESSION: 1. No acute  intracranial findings. 2. Left supraorbital soft tissue swelling without evidence of acute globe injury. 3. Partially opacified posterior ethmoid air cells on the left without air-fluid levels or definite facial fracture. Electronically Signed   By: Carey Bullocks M.D.   On: 09/07/2018 16:22   Dg Chest Portable 1 View  Result Date: 09/25/2018 CLINICAL DATA:  Fevers, headache, and vomiting. Assessment for COVID-19. EXAM: PORTABLE CHEST 1 VIEW COMPARISON:  05/22/2018 FINDINGS: The cardiomediastinal silhouette is grossly unchanged allowing for rightward patient rotation and magnification from portable AP technique. No airspace consolidation, edema, pleural effusion, or pneumothorax is identified. Mild lower thoracic levoscoliosis is again noted. IMPRESSION: No active disease. Electronically Signed   By: Sebastian Ache M.D.   On: 09/25/2018 17:26      Discharge Exam: Vitals:   09/26/18 2231 09/27/18 0645  BP: (!) 143/89 121/72  Pulse: 72 70  Resp: 16 16  Temp: 98.3 F (36.8 C) 98.4 F (36.9 C)  SpO2: 93% 97%   Vitals:   09/26/18 0446 09/26/18  1438 09/26/18 2231 09/27/18 0645  BP: 126/60 139/85 (!) 143/89 121/72  Pulse: 73 73 72 70  Resp: Temp: 97.8 F (36.6 C) 98.1 F (36.7 C) 98.3 F (36.8 C) 98.4 F (36.9 C)  TempSrc: Oral     SpO2: 94% 95% 93% 97%  Weight:      Height:        General: Pt is alert, awake, not in acute distress Cardiovascular: RRR, S1/S2 +, no rubs, no gallops Respiratory: CTA bilaterally, no wheezing, no rhonchi Abdominal: Soft, NT, ND, bowel sounds + Extremities: no edema, no cyanosis    The results of significant diagnostics from this hospitalization (including imaging, microbiology, ancillary and laboratory) are listed below for reference.     Microbiology: Recent Results (from the past 240 hour(s))  SARS Coronavirus 2 (CEPHEID- Performed in [facility] hospital lab), Hosp Order     Status: None   Collection Time: 09/25/18  4:37 PM   Result Value Ref Range Status   SARS Coronavirus 2 NEGATIVE NEGATIVE Final    Comment: (NOTE) If result is NEGATIVE SARS-CoV-2 target nucleic acids are NOT DETECTED. The SARS-CoV-2 RNA is generally detectable in upper and lower  respiratory specimens during the acute phase of infection. The lowest  concentration of SARS-CoV-2 viral copies this assay can detect is 250  copies / mL. A negative result does not preclude SARS-CoV-2 infection  and should not be used as the sole basis for treatment or other  patient management decisions.  A negative result may occur with  improper specimen collection / handling, submission of specimen other  than nasopharyngeal swab, presence of viral mutation(s) within the  areas targeted by this assay, and inadequate number of viral copies  (<250 copies / mL). A negative result must be combined with clinical  observations, patient history, and epidemiological information. If result is POSITIVE SARS-CoV-2 target nucleic acids are DETECTED. The SARS-CoV-2 RNA is generally detectable in upper and lower  respiratory specimens dur ing the acute phase of infection.  Positive  results are indicative of active infection with SARS-CoV-2.  Clinical  correlation with patient history and other diagnostic information is  necessary to determine patient infection status.  Positive results do  not rule out bacterial infection or co-infection with other viruses. If result is PRESUMPTIVE POSTIVE SARS-CoV-2 nucleic acids MAY BE PRESENT.   A presumptive positive result was obtained on the submitted specimen  and confirmed on repeat testing.  While 2019 novel coronavirus  (SARS-CoV-2) nucleic acids may be present in the submitted sample  additional confirmatory testing may be necessary for epidemiological  and / or clinical management purposes  to differentiate between  SARS-CoV-2 and other Sarbecovirus currently known to infect humans.  If clinically indicated additional  testing with an alternate test  methodology [phone]) is advised. The SARS-CoV-2 RNA is generally  detectable in upper and lower respiratory sp ecimens during the acute  phase of infection. The expected result is Negative. Fact Sheet for Patients:  BoilerBrush.com.cy Fact Sheet for Healthcare Providers: https://pope.com/ This test is not yet approved or cleared by the [place] FDA and has been authorized for detection and/or diagnosis of SARS-CoV-2 by FDA under an Emergency Use Authorization (EUA).  This EUA will remain in effect (meaning this test can be used) for the duration of the COVID-19 declaration under Section 564(b)(1) of the Act, 21 U.S.C. section 360bbb-3(b)(1), unless the authorization is terminated or revoked sooner. Performed at [facility], [address] [place], [place] [address]   Culture, sputum-assessment     Status: None   Collection Time: 09/27/18 12:44 AM  Result Value Ref Range Status   Specimen Description SPUTUM  Final   Special Requests NONE  Final   Sputum evaluation   Final    THIS SPECIMEN IS ACCEPTABLE FOR SPUTUM CULTURE Performed at [facility], [address] [place], [place] [address]    Report Status 09/27/2018 FINAL  Final  Culture, respiratory     Status: None (Preliminary result)   Collection Time: 09/27/18 12:44 AM  Result Value Ref Range Status   Specimen Description   Final    SPUTUM Performed at [facility], [address] [place], [place] [address]    Special Requests   Final    NONE Reflexed from [id number] Performed at [facility], [address] [place], [place] [address]    Gram Stain   Final    RARE WBC PRESENT, PREDOMINANTLY PMN FEW YEAST RARE GRAM POSITIVE RODS RARE GRAM NEGATIVE RODS RARE GRAM POSITIVE COCCI IN PAIRS    Culture   Final    NO GROWTH < 12 HOURS Performed at [facility], [address], [place], [place] [address]    Report Status PENDING  Incomplete     Labs: BNP (last 3 results) No results for input(s): BNP in the last 8760 hours. Basic Metabolic Panel: Recent Labs  Lab 09/25/18 1528 09/26/18 0047 09/27/18 0430  NA 139 139 138  K 4.0 3.4* 3.8  CL 99 106 105  CO2 29 22 27   GLUCOSE 110* 138* 108*  BUN 22 19 18   CREATININE 1.63* 1.52* 1.49*  CALCIUM 9.0 8.6* 8.7*   Liver Function Tests: Recent Labs  Lab 09/25/18 1528  AST 29  ALT 20  ALKPHOS 74  BILITOT 0.4  PROT 7.0  ALBUMIN 3.7   Recent Labs  Lab 09/25/18 1528  LIPASE 25   No results for input(s): AMMONIA in the last 168 hours. CBC: Recent Labs  Lab 09/25/18 1528 09/26/18 0047 09/27/18 0430  WBC 7.5 9.6 7.1  NEUTROABS  --   --  2.8  HGB 11.8* 11.5* 10.2*  HCT 37.1 38.4 33.5*  MCV 85.3 86.5 86.3  PLT 251 227 208   Cardiac Enzymes: No results for input(s): CKTOTAL, CKMB, CKMBINDEX, TROPONINI in the last 168 hours. BNP: Invalid input(s): POCBNP CBG: No results for input(s): GLUCAP in the last 168 hours. D-Dimer No results for input(s): DDIMER in the last 72 hours. Hgb A1c No results for input(s): HGBA1C in the last 72 hours. Lipid Profile No results for input(s): CHOL, HDL, LDLCALC, TRIG, CHOLHDL, LDLDIRECT in the last 72 hours. Thyroid function studies No results for input(s): TSH, T4TOTAL, T3FREE, THYROIDAB in the last 72 hours.  Invalid input(s): FREET3 Anemia work up No results for input(s): VITAMINB12, FOLATE, FERRITIN, TIBC, IRON, RETICCTPCT in the last 72 hours. Urinalysis    Component Value Date/Time   COLORURINE STRAW (A) 09/25/2018 1345   APPEARANCEUR CLEAR 09/25/2018 1345   LABSPEC 1.006 09/25/2018 1345   PHURINE 7.0 09/25/2018 1345   GLUCOSEU NEGATIVE 09/25/2018 1345   HGBUR NEGATIVE 09/25/2018 1345   BILIRUBINUR NEGATIVE 09/25/2018 1345   BILIRUBINUR negative 03/29/2018 0927   BILIRUBINUR neg 12/15/2014 1059   KETONESUR NEGATIVE  09/25/2018 1345   PROTEINUR NEGATIVE 09/25/2018 1345   UROBILINOGEN 0.2 03/29/2018 0927   UROBILINOGEN 1.0 12/03/2014 0648   NITRITE NEGATIVE 09/25/2018 1345  LEUKOCYTESUR NEGATIVE 09/25/2018 1345   Sepsis Labs Invalid input(s): PROCALCITONIN,  WBC,  LACTICIDVEN Microbiology Recent Results (from the past 240 hour(s))  SARS Coronavirus 2 (CEPHEID- Performed in [facility] hospital lab), Hosp Order     Status: None   Collection Time: 09/25/18  4:37 PM  Result Value Ref Range Status   SARS Coronavirus 2 NEGATIVE NEGATIVE Final    Comment: (NOTE) If result is NEGATIVE SARS-CoV-2 target nucleic acids are NOT DETECTED. The SARS-CoV-2 RNA is generally detectable in upper and lower  respiratory specimens during the acute phase of infection. The lowest  concentration of SARS-CoV-2 viral copies this assay can detect is 250  copies / mL. A negative result does not preclude SARS-CoV-2 infection  and should not be used as the sole basis for treatment or other  patient management decisions.  A negative result may occur with  improper specimen collection / handling, submission of specimen other  than nasopharyngeal swab, presence of viral mutation(s) within the  areas targeted by this assay, and inadequate number of viral copies  (<250 copies / mL). A negative result must be combined with clinical  observations, patient history, and epidemiological information. If result is POSITIVE SARS-CoV-2 target nucleic acids are DETECTED. The SARS-CoV-2 RNA is generally detectable in upper and lower  respiratory specimens dur ing the acute phase of infection.  Positive  results are indicative of active infection with SARS-CoV-2.  Clinical  correlation with patient history and other diagnostic information is  necessary to determine patient infection status.  Positive results do  not rule out bacterial infection or co-infection with other viruses. If result is PRESUMPTIVE POSTIVE SARS-CoV-2 nucleic acids  MAY BE PRESENT.   A presumptive positive result was obtained on the submitted specimen  and confirmed on repeat testing.  While 2019 novel coronavirus  (SARS-CoV-2) nucleic acids may be present in the submitted sample  additional confirmatory testing may be necessary for epidemiological  and / or clinical management purposes  to differentiate between  SARS-CoV-2 and other Sarbecovirus currently known to infect humans.  If clinically indicated additional testing with an alternate test  methodology [phone]) is advised. The SARS-CoV-2 RNA is generally  detectable in upper and lower respiratory sp ecimens during the acute  phase of infection. The expected result is Negative. Fact Sheet for Patients:  BoilerBrush.com.cy Fact Sheet for Healthcare Providers: https://pope.com/ This test is not yet approved or cleared by the [place] FDA and has been authorized for detection and/or diagnosis of SARS-CoV-2 by FDA under an Emergency Use Authorization (EUA).  This EUA will remain in effect (meaning this test can be used) for the duration of the COVID-19 declaration under Section 564(b)(1) of the Act, 21 U.S.C. section 360bbb-3(b)(1), unless the authorization is terminated or revoked sooner. Performed at [facility], [address], [place], [place] [address]   Culture, sputum-assessment     Status: None   Collection Time: 09/27/18 12:44 AM  Result Value Ref Range Status   Specimen Description SPUTUM  Final   Special Requests NONE  Final   Sputum evaluation   Final    THIS SPECIMEN IS ACCEPTABLE FOR SPUTUM CULTURE Performed at [facility], [address] [place], [place] [address]    Report Status 09/27/2018 FINAL  Final  Culture, respiratory     Status: None (Preliminary result)   Collection Time: 09/27/18 12:44 AM  Result Value Ref Range Status   Specimen Description   Final    SPUTUM Performed  at [facility], [address] [place], [place] [address]    Special Requests   Final    NONE Reflexed from [id number] Performed at [facility], [address] [place], [place] [address]    Gram Stain   Final    RARE WBC PRESENT, PREDOMINANTLY PMN FEW YEAST RARE GRAM POSITIVE RODS RARE GRAM NEGATIVE RODS RARE GRAM POSITIVE COCCI IN PAIRS    Culture   Final    NO GROWTH < 12 HOURS Performed at [facility], [address] [place], [place] [address]    Report Status PENDING  Incomplete     Time coordinating discharge: 27 minutes  SIGNED:   Hughie Closs, MD  [facility] 09/27/2018, 11:18 AM Pager [phone]  If 7PM-7AM, please contact night-coverage [email or web address] Password [email or web address]

## 2018-09-29 LAB — CULTURE, RESPIRATORY W GRAM STAIN: Culture: NORMAL

## 2018-10-01 LAB — CULTURE, BLOOD (ROUTINE X 2)
Culture: NO GROWTH
Special Requests: ADEQUATE

## 2018-10-02 ENCOUNTER — Other Ambulatory Visit: Payer: Self-pay | Admitting: *Deleted

## 2018-10-02 NOTE — Patient Outreach (Signed)
[facility]) Care Management  10/02/2018  PIERRE WEINSTOCK 1955-09-10 [id number]   Subjective: Telephone call to patient's home / mobile number, spoke with patient, and HIPAA verified.  Discussed [facility] Care Management [employer] EMMI Red Flag Alert follow up, patient voiced understanding, and is in agreement to follow up.  Patient states she is doing okay, remembers receiving automated calls, she does not have wound, does not have any questions, and EMMI captured incorrect answers.  States she has a follow up appointment with primary MD the first week in June and is not sure of the exact date (does not have access to her calendar at this time).  Patient states she does not have any education material, EMMI follow up, care coordination, care management, disease monitoring, transportation, community resource, or pharmacy needs at this time.  States she is very appreciative of the follow up.       Objective:  Per KPN (Knowledge Performance Now, point of care tool) and chart review, patient hospitalized 09/25/2018 - 09/27/2018 for upper respiratory infection.   Patient also has a history of hyperlipidemia, hypertension, chronic kidney disease, hypothyroidism, sleep apnea, and migraines.      Assessment: Received [employer] EMMI General Discharge Red Flag Alert times 2 follow up referral on 10/02/2018.  Red Flag Alert Triggers, Day # 1, patient answered no to the following question: Wounds healing well?   Patient answered yes to the following question: Other questions/problems?    EMMI follow up completed and no further care management needs.      Plan:RNCM will complete case closure due to follow up completed / no care management needs.      Lucelia Lacey H. Gardiner Barefoot, BSN, CCM [facility] Care Management [facility] Telephonic CM Phone: [phone] Fax: [phone]

## 2018-10-12 ENCOUNTER — Other Ambulatory Visit: Payer: Self-pay | Admitting: Orthopedic Surgery

## 2018-10-16 NOTE — Progress Notes (Signed)
09/26/2018- noted in Epic- EKG and CXR

## 2018-10-16 NOTE — Patient Instructions (Signed)
Jennifer Davidson  10/16/2018   Your procedure is scheduled on: Friday 10/20/2018  Report to [facility] Main  Entrance              Report to admitting at  115 PM               YOU NEED TO HAVE A COVID 19 TEST ON_______ @_______ , THIS TEST MUST BE DONE BEFORE SURGERY, COME TO [facility] ENTR[facility].    Call this number if you have problems the morning of surgery [phone]    Remember: Do not eat food  :After Midnight.  NO SOLID FOOD AFTER MIDNIGHT THE NIGHT PRIOR TO SURGERY. NOTHING BY MOUTH EXCEPT CLEAR LIQUIDS UNTIL  0430 am.  PLEASE FINISH ENSURE DRINK PER SURGEON ORDER WHICH NEEDS TO BE COMPLETED AT  0430 am.   CLEAR LIQUID DIET   Foods Allowed                                                                     Foods Excluded  Coffee and tea, regular and decaf                             liquids that you cannot  Plain Jell-O in any flavor                                             see through such as: Fruit ices (not with fruit pulp)                                     milk, soups, orange juice  Iced Popsicles                                    All solid food Carbonated beverages, regular and diet                                    Cranberry, grape and apple juices Sports drinks like Gatorade Lightly seasoned clear broth or consume(fat free) Sugar, honey syrup  Sample Menu Breakfast                                Lunch                                     Supper Cranberry juice                    Beef broth  Chicken broth Jell-O                                     Grape juice                           Apple juice Coffee or tea                        Jell-O                                      Popsicle                                                Coffee or tea                        Coffee or tea  _____________________________________________________________________               BRUSH YOUR TEETH  MORNING OF SURGERY AND RINSE YOUR MOUTH OUT, NO CHEWING GUM CANDY OR MINTS.     Take these medicines the morning of surgery with A SIP OF WATER: Levothyroxine (Synthroid), Buspirone (Buspar), Duloxetine (Cymbalta), Esomeprazole (Nexium), Gabapentin (Neurontin), use Flonase nasal spray                                You may not have any metal on your body including hair pins and              piercings  Do not wear jewelry, make-up, lotions, powders or perfumes, deodorant             Do not wear nail polish.  Do not shave  48 hours prior to surgery.            Do not bring valuables to the hospital.  IS NOT             RESPONSIBLE   FOR VALUABLES.  Contacts, dentures or bridgework may not be worn into surgery.  Leave suitcase in the car. After surgery it may be brought to your room.     Patients discharged the day of surgery will not be allowed to drive home. IF YOU ARE HAVING SURGERY AND GOING HOME THE SAME DAY, YOU MUST HAVE AN ADULT TO DRIVE YOU HOME AND BE WITH YOU FOR 24 HOURS. YOU MAY GO HOME BY TAXI OR UBER OR ORTHERWISE, BUT AN ADULT MUST ACCOMPANY YOU HOME AND STAY WITH YOU FOR 24 HOURS.  Name and phone number of your driver:               Please read over the following fact sheets you were given: _____________________________________________________________________             [facility] - Preparing for Surgery Before surgery, you can play an important role.  Because skin is not sterile, your skin needs to be as free of germs as possible.  You can reduce the number of germs on your skin by washing with CHG (chlorahexidine gluconate) soap before surgery.  CHG is an antiseptic cleaner which kills germs and bonds with the skin to continue killing germs even after washing. Please DO NOT use if you have an allergy to CHG or antibacterial soaps.  If your skin becomes reddened/irritated stop using the CHG and inform your nurse when you arrive at Short Stay. Do not shave  (including legs and underarms) for at least 48 hours prior to the first CHG shower.  You may shave your face/neck. Please follow these instructions carefully:  1.  Shower with CHG Soap the night before surgery and the  morning of Surgery.  2.  If you choose to wash your hair, wash your hair first as usual with your  normal  shampoo.  3.  After you shampoo, rinse your hair and body thoroughly to remove the  shampoo.                           4.  Use CHG as you would any other liquid soap.  You can apply chg directly  to the skin and wash                       Gently with a scrungie or clean washcloth.  5.  Apply the CHG Soap to your body ONLY FROM THE NECK DOWN.   Do not use on face/ open                           Wound or open sores. Avoid contact with eyes, ears mouth and genitals (private parts).                       Wash face,  Genitals (private parts) with your normal soap.             6.  Wash thoroughly, paying special attention to the area where your surgery  will be performed.  7.  Thoroughly rinse your body with warm water from the neck down.  8.  DO NOT shower/wash with your normal soap after using and rinsing off  the CHG Soap.                9.  Pat yourself dry with a clean towel.            10.  Wear clean pajamas.            11.  Place clean sheets on your bed the night of your first shower and do not  sleep with pets. Day of Surgery : Do not apply any lotions/deodorants the morning of surgery.  Please wear clean clothes to the hospital/surgery center.  FAILURE TO FOLLOW THESE INSTRUCTIONS MAY RESULT IN THE CANCELLATION OF YOUR SURGERY PATIENT SIGNATURE_________________________________  NURSE SIGNATURE__________________________________  ________________________________________________________________________   Rogelia MireIncentive Spirometer  An incentive spirometer is a tool that can help keep your lungs clear and active. This tool measures how well you are filling your lungs with  each breath. Taking long deep breaths may help reverse or decrease the chance of developing breathing (pulmonary) problems (especially infection) following:  A long period of time when you are unable to move or be active. BEFORE THE PROCEDURE   If the spirometer includes an indicator to show your best effort, your nurse or respiratory therapist will set it to a desired goal.  If possible, sit up straight or lean slightly forward. Try not to slouch.  Hold the incentive spirometer in an upright position. INSTRUCTIONS FOR USE  1. Sit on the edge of your bed if possible, or sit up as far as you can in bed or on a chair. 2. Hold the incentive spirometer in an upright position. 3. Breathe out normally. 4. Place the mouthpiece in your mouth and seal your lips tightly around it. 5. Breathe in slowly and as deeply as possible, raising the piston or the ball toward the top of the column. 6. Hold your breath for 3-5 seconds or for as long as possible. Allow the piston or ball to fall to the bottom of the column. 7. Remove the mouthpiece from your mouth and breathe out normally. 8. Rest for a few seconds and repeat Steps 1 through 7 at least 10 times every 1-2 hours when you are awake. Take your time and take a few normal breaths between deep breaths. 9. The spirometer may include an indicator to show your best effort. Use the indicator as a goal to work toward during each repetition. 10. After each set of 10 deep breaths, practice coughing to be sure your lungs are clear. If you have an incision (the cut made at the time of surgery), support your incision when coughing by placing a pillow or rolled up towels firmly against it. Once you are able to get out of bed, walk around indoors and cough well. You may stop using the incentive spirometer when instructed by your caregiver.  RISKS AND COMPLICATIONS  Take your time so you do not get dizzy or light-headed.  If you are in pain, you may need to take or  ask for pain medication before doing incentive spirometry. It is harder to take a deep breath if you are having pain. AFTER USE  Rest and breathe slowly and easily.  It can be helpful to keep track of a log of your progress. Your caregiver can provide you with a simple table to help with this. If you are using the spirometer at home, follow these instructions: SEEK MEDICAL CARE IF:   You are having difficultly using the spirometer.  You have trouble using the spirometer as often as instructed.  Your pain medication is not giving enough relief while using the spirometer.  You develop fever of 100.5 F (38.1 C) or higher. SEEK IMMEDIATE MEDICAL CARE IF:   You cough up bloody sputum that had not been present before.  You develop fever of 102 F (38.9 C) or greater.  You develop worsening pain at or near the incision site. MAKE SURE YOU:   Understand these instructions.  Will watch your condition.  Will get help right away if you are not doing well or get worse. Document Released: 09/13/2006 Document Revised: 07/26/2011 Document Reviewed: 11/14/2006 [facility] Patient Information 2014 [place], [place].   ________________________________________________________________________

## 2018-10-17 ENCOUNTER — Encounter (HOSPITAL_COMMUNITY): Payer: Self-pay

## 2018-10-17 ENCOUNTER — Other Ambulatory Visit (HOSPITAL_COMMUNITY)
Admission: RE | Admit: 2018-10-17 | Discharge: 2018-10-17 | Disposition: A | Discharge status: Home / Self Care | Payer: Medicare HMO | Source: Ambulatory Visit | Attending: Orthopedic Surgery | Admitting: Orthopedic Surgery

## 2018-10-17 ENCOUNTER — Other Ambulatory Visit: Payer: Self-pay

## 2018-10-17 ENCOUNTER — Encounter (HOSPITAL_COMMUNITY)
Admission: RE | Admit: 2018-10-17 | Discharge: 2018-10-17 | Disposition: A | Discharge status: Home / Self Care | Payer: Medicare HMO | Source: Ambulatory Visit | Attending: Orthopedic Surgery | Admitting: Orthopedic Surgery

## 2018-10-17 DIAGNOSIS — G43909 Migraine, unspecified, not intractable, without status migrainosus: Secondary | ICD-10-CM | POA: Diagnosis not present

## 2018-10-17 DIAGNOSIS — G709 Myoneural disorder, unspecified: Secondary | ICD-10-CM | POA: Diagnosis not present

## 2018-10-17 DIAGNOSIS — W19XXXA Unspecified fall, initial encounter: Secondary | ICD-10-CM | POA: Diagnosis not present

## 2018-10-17 DIAGNOSIS — K219 Gastro-esophageal reflux disease without esophagitis: Secondary | ICD-10-CM | POA: Diagnosis not present

## 2018-10-17 DIAGNOSIS — Z1159 Encounter for screening for other viral diseases: Secondary | ICD-10-CM | POA: Insufficient documentation

## 2018-10-17 DIAGNOSIS — F419 Anxiety disorder, unspecified: Secondary | ICD-10-CM | POA: Diagnosis not present

## 2018-10-17 DIAGNOSIS — M545 Low back pain: Secondary | ICD-10-CM | POA: Diagnosis not present

## 2018-10-17 DIAGNOSIS — M19012 Primary osteoarthritis, left shoulder: Secondary | ICD-10-CM | POA: Diagnosis not present

## 2018-10-17 DIAGNOSIS — I130 Hypertensive heart and chronic kidney disease with heart failure and stage 1 through stage 4 chronic kidney disease, or unspecified chronic kidney disease: Secondary | ICD-10-CM | POA: Diagnosis not present

## 2018-10-17 DIAGNOSIS — I5030 Unspecified diastolic (congestive) heart failure: Secondary | ICD-10-CM | POA: Diagnosis not present

## 2018-10-17 DIAGNOSIS — N189 Chronic kidney disease, unspecified: Secondary | ICD-10-CM | POA: Diagnosis not present

## 2018-10-17 DIAGNOSIS — E039 Hypothyroidism, unspecified: Secondary | ICD-10-CM | POA: Diagnosis not present

## 2018-10-17 DIAGNOSIS — R0681 Apnea, not elsewhere classified: Secondary | ICD-10-CM | POA: Diagnosis not present

## 2018-10-17 DIAGNOSIS — M7541 Impingement syndrome of right shoulder: Secondary | ICD-10-CM | POA: Diagnosis not present

## 2018-10-17 DIAGNOSIS — Z01812 Encounter for preprocedural laboratory examination: Secondary | ICD-10-CM | POA: Insufficient documentation

## 2018-10-17 LAB — CBC
HCT: 40.3 % (ref 36.0–46.0)
Hemoglobin: 12.5 g/dL (ref 12.0–15.0)
MCH: 26.4 pg (ref 26.0–34.0)
MCHC: 31 g/dL (ref 30.0–36.0)
MCV: 85.2 fL (ref 80.0–100.0)
Platelets: 260 10*3/uL (ref 150–400)
RBC: 4.73 MIL/uL (ref 3.87–5.11)
RDW: 15 % (ref 11.5–15.5)
WBC: 8.5 10*3/uL (ref 4.0–10.5)
nRBC: 0 % (ref 0.0–0.2)

## 2018-10-17 LAB — BASIC METABOLIC PANEL
Anion gap: 8 (ref 5–15)
BUN: 19 mg/dL (ref 8–23)
CO2: 24 mmol/L (ref 22–32)
Calcium: 10.1 mg/dL (ref 8.9–10.3)
Chloride: 107 mmol/L (ref 98–111)
Creatinine, Ser: 1.53 mg/dL — ABNORMAL HIGH (ref 0.44–1.00)
GFR calc Af Amer: 41 mL/min — ABNORMAL LOW (ref >60)
GFR calc non Af Amer: 36 mL/min — ABNORMAL LOW (ref >60)
Glucose, Bld: 99 mg/dL (ref 70–99)
Potassium: 4.9 mmol/L (ref 3.5–5.1)
Sodium: 139 mmol/L (ref 135–145)

## 2018-10-18 LAB — NOVEL CORONAVIRUS, NAA (HOSP ORDER, SEND-OUT TO REF LAB; TAT 18-24 HRS): SARS-CoV-2, NAA: NOT DETECTED

## 2018-10-18 NOTE — Anesthesia Preprocedure Evaluation (Addendum)
Anesthesia Evaluation  Patient identified by MRN, date of birth, ID band Patient awake    Reviewed: Allergy & Precautions, NPO status , Patient's Chart, lab work & pertinent test results  Airway Mallampati: II       Dental no notable dental hx. (+) Teeth Intact   Pulmonary sleep apnea ,    breath sounds clear to auscultation       Cardiovascular hypertension, Pt. on medications Normal cardiovascular exam Rhythm:Regular Rate:Normal     Neuro/Psych  Headaches, Seizures -, Well Controlled,  PSYCHIATRIC DISORDERS Anxiety Depression    GI/Hepatic GERD  Medicated and Controlled,  Endo/Other  Hypothyroidism   Renal/GU   negative genitourinary   Musculoskeletal   Abdominal (+) + obese,   Peds  Hematology   Anesthesia Other Findings Result status: Final result                     *[facility]*                         [address]                        [place], [place] [address]                            [phone]  ------------------------------------------------------------------- Transthoracic Echocardiography  Patient:    Jennifer Davidson, Jennifer Davidson MR #:       [id number] Study Date: 08/31/2015 Gender:     F Age:        63 Height:     160 cm Weight:     92 kg BSA:        2.06 m^2 Pt. Status: Room:       A305   ADMITTING    Assunta Gambles, Rachal A  REFERRING    Tarry Kos A  PERFORMING   [facility], Jeani Hawking  SONOGRAPHER  Orlando Penner, RCS  ATTENDING    Crista Curb Duo  cc:  ------------------------------------------------------------------- LV EF: 60% -   65%     Reproductive/Obstetrics                          Anesthesia Physical Anesthesia Plan  ASA: II  Anesthesia Plan: General   Post-op Pain Management:  Regional for Post-op pain   Induction: Intravenous  PONV Risk Score and Plan: 3 and Ondansetron and Dexamethasone  Airway  Management Planned: Oral ETT  Additional Equipment:   Intra-op Plan:   Post-operative Plan: Extubation in OR  Informed Consent: I have reviewed the patients History and Physical, chart, labs and discussed the procedure including the risks, benefits and alternatives for the proposed anesthesia with the patient or authorized representative who has indicated his/her understanding and acceptance.     Dental advisory given  Plan Discussed with: CRNA  Anesthesia Plan Comments: (See PAT note 10/17/2018, Jodell Cipro, [name])       Anesthesia Quick Evaluation

## 2018-10-18 NOTE — Progress Notes (Signed)
Anesthesia Chart Review   Case:  [id number] Date/Time:  10/20/18 1500   Procedures:      SHOULDER ARTHROSCOPY WITH SUBACROMIAL DECOMPRESSION (Left )     SHOULDER ARTHROSCOPY WITH OPEN ROTATOR CUFF REPAIR POSSIBLE DEBRIDEMENT (Left )     SHOULDER ARTHROSCOPY WITH DISTAL CLAVICLE RESECTION (Left )   Anesthesia type:  General   Pre-op diagnosis:  LEFT SHOULDER SCOPE, SUBACROMIAL DECOMPRESSION, DISTAL CLAVICLE RESECTION, OPEN ROTATOR CUFF REPAIR VS POSSIBLE DEBRIDEMENT   Location:  [facility] ROOM 06 / [facility]   Surgeon:  Jodi Geralds, MD      DISCUSSION: 63 yo never smoker with h/o HTN, hypothyroidism, hyperparathyroidism, migraines, anxiety, depression, CKD (creatinine stable, 1.53 at PAT visit 10/17/2018), sleep apnea, GERD, left shoulder subacromial OA and rotator cuff tear scheduled for above procedure 10/20/2018 with Dr. Jodi Geralds.   Pt admitted 5/11-5/13/2020 due to suspected pneumonia.  Per discharge summary, "She did not have any leukocytosis and she remained afebrile with no hypoxia at all.  There was high suspicion for erronous readings on the imaging study so repeat chest x-ray with two-view was done which did not show any consolidation or infiltrates.  Based on this, patient does not seem to have bacterial pneumonia however she may have had upper respiratory viral infection."  At this time symptoms have resolved completely.  Stable at PAT visit without cough, shortness of breath, fever chills, chest pain.   Pt can proceed with planned procedure barring acute status change.  VS: BP 105/65   Pulse 78   Temp 36.7 C (Oral)   Resp 18   Ht 5\' 3"  (1.6 m)   Wt 94.8 kg   SpO2 100%   BMI 37.03 kg/m   PROVIDERS: Myles Lipps, MD is PCP    LABS: Labs reviewed: Acceptable for surgery. (all labs ordered are listed, but only abnormal results are displayed)  Labs Reviewed  BASIC METABOLIC PANEL - Abnormal; Notable for the following components:      Result Value   Creatinine, Ser 1.53 (*)    GFR calc non Af Amer 36 (*)    GFR calc Af Amer 41 (*)    All other components within normal limits  CBC     IMAGES: Chest Xray 09/26/2018 FINDINGS: Lungs are clear. Heart size and pulmonary vascularity are normal. No adenopathy. No bone lesions.  IMPRESSION: No edema or consolidation.  EKG: 09/26/2018 Rate 67 bpm Sinus rhythm  Low voltage, precordial leads Abnormal T wave progression, early transition Borderline T abnormalities, anterior leads   CV: Echo 08/31/15 Study Conclusions  - Left ventricle: The cavity size was normal. Systolic function was   normal. The estimated ejection fraction was in the range of 60%   to 65%. Wall motion was normal; there were no regional wall   motion abnormalities. Left ventricular diastolic function   parameters were normal. - Aortic valve: Valve area (Vmax): 2.23 cm^2. Past Medical History:  Diagnosis Date  . Acute encephalopathy 06/25/2016  . Anxiety   . Cataract 08/31/2017   left eye  . Cholesterol serum increased   . Chronic back pain    chronic Rt low back pain. s/p L4-5 fusion. failed Rt facet injections. poss due to Rt SI joint dysfunction.  . Chronic kidney disease   . Depression   . Diastolic heart failure (HCC)   . GERD (gastroesophageal reflux disease)   . Hyperparathyroidism (HCC)   . Hypertension   . Hypothyroidism   . Migraine   . Neuromuscular disorder (  HCC)   . Sleep apnea   . Syncope 08/30/2015  . Tachycardia   . Trochanteric bursitis of both hips 2012   Confirmed on MRI    Past Surgical History:  Procedure Laterality Date  . ABDOMINAL HYSTERECTOMY    . APPENDECTOMY  1986  . BACK SURGERY     L4-5 fusion  . CATARACT EXTRACTION    . KNEE SURGERY      MEDICATIONS: . benzonatate (TESSALON) 100 MG capsule  . busPIRone (BUSPAR) 5 MG tablet  . diclofenac sodium (VOLTAREN) 1 % GEL  . DULoxetine (CYMBALTA) 60 MG capsule  . esomeprazole (NEXIUM) 40 MG capsule  . fluticasone (FLONASE) 50 MCG/ACT nasal  spray  . gabapentin (NEURONTIN) 600 MG tablet  . hydrOXYzine (ATARAX/VISTARIL) 50 MG tablet  . levothyroxine (SYNTHROID, LEVOTHROID) 50 MCG tablet  . meclizine (ANTIVERT) 25 MG tablet  . Melatonin 10 MG TABS  . ondansetron (ZOFRAN-ODT) 8 MG disintegrating tablet  . prazosin (MINIPRESS) 2 MG capsule  . prazosin (MINIPRESS) 5 MG capsule  . QUEtiapine (SEROQUEL) 50 MG tablet  . rizatriptan (MAXALT-MLT) 10 MG disintegrating tablet   No current facility-administered medications for this encounter.     Janey GentaJessica Rebbeca Sheperd, [name] [facility] [phone] 10/18/18 9:41 AM

## 2018-10-19 NOTE — Progress Notes (Signed)
SPOKE W/  patieni     SCREENING SYMPTOMS OF COVID 19:   COUGH--no  RUNNY NOSE--- no  SORE THROAT---no  NASAL CONGESTION----no  SNEEZING----no  SHORTNESS OF BREATH---no  DIFFICULTY BREATHING---no  TEMP >100.0 -----no  UNEXPLAINED BODY ACHES------no  CHILLS -------- no  HEADACHES ---------no  LOSS OF SMELL/ TASTE --------no    HAVE YOU OR ANY FAMILY MEMBER TRAVELLED PAST 14 DAYS OUT OF THE   COUNTY---no STATE----no COUNTRY----no  HAVE YOU OR ANY FAMILY MEMBER BEEN EXPOSED TO ANYONE WITH COVID 19? no

## 2018-10-19 NOTE — H&P (Addendum)
A pre op hand p   Chief Complaint: left shoulder pain  HPI: Jennifer Davidson is a 63 y.o. female who presents for evaluation of left shoulder pain. It has been present for greater than 3 months and has been worsening. She has failed conservative measures. Pain is rated as moderate.  Past Medical History:  Diagnosis Date  . Acute encephalopathy 06/25/2016  . Anxiety   . Cataract 08/31/2017   left eye  . Cholesterol serum increased   . Chronic back pain    chronic Rt low back pain. s/p L4-5 fusion. failed Rt facet injections. poss due to Rt SI joint dysfunction.  . Chronic kidney disease   . Depression   . Diastolic heart failure (HCC)   . GERD (gastroesophageal reflux disease)   . Hyperparathyroidism (HCC)   . Hypertension   . Hypothyroidism   . Migraine   . Neuromuscular disorder (HCC)   . Sleep apnea   . Syncope 08/30/2015  . Tachycardia   . Trochanteric bursitis of both hips 2012   Confirmed on MRI   Past Surgical History:  Procedure Laterality Date  . ABDOMINAL HYSTERECTOMY    . APPENDECTOMY  1986  . BACK SURGERY     L4-5 fusion  . CATARACT EXTRACTION    . KNEE SURGERY     Social History   Socioeconomic History  . Marital status: Married    Spouse name: Not on file  . Number of children: 3  . Years of education: 5  . Highest education level: Some college, no degree  Occupational History  . Not on file  Social Needs  . Financial resource strain: Not hard at all  . Food insecurity:    Worry: Never true    Inability: Never true  . Transportation needs:    Medical: No    Non-medical: No  Tobacco Use  . Smoking status: Never Smoker  . Smokeless tobacco: Never Used  Substance and Sexual Activity  . Alcohol use: No  . Drug use: No    Comment: 08-31-2016 PER PT NO   . Sexual activity: Yes    Partners: Male    Comment: married  Lifestyle  . Physical activity:    Days per week: 0 days    Minutes per session: 0 min  . Stress: Very much  Relationships  .  Social connections:    Talks on phone: More than three times a week    Gets together: More than three times a week    Attends religious service: Never    Active member of club or organization: No    Attends meetings of clubs or organizations: Never    Relationship status: Married  Other Topics Concern  . Not on file  Social History Narrative  . Not on file   Family History  Problem Relation Age of Onset  . Diabetes Mother   . Heart disease Mother   . Kidney disease Mother   . Thyroid disease Mother   . Heart disease Father   . Seizures Father   . COPD Father   . Irritable bowel syndrome Father   . Cancer Maternal Grandmother   . Diabetes Sister   . Diabetes Brother   . Colon cancer Neg Hx   . Stomach cancer Neg Hx    Allergies  Allergen Reactions  . Tramadol Nausea And Vomiting  . Trazodone And Nefazodone Other (See Comments)    Per pt trazodone caused hallucinations and behavior changes    Prior  to Admission medications   Medication Sig Start Date End Date Taking? Authorizing Provider  benzonatate (TESSALON) 100 MG capsule Take 1 capsule (100 mg total) by mouth 3 (three) times daily as needed for cough. 09/19/18   Shade Flood, MD  busPIRone (BUSPAR) 5 MG tablet Take 1 tablet (5 mg total) by mouth 3 (three) times daily. May take 4th dose in evening for anxiety exacerbation 08/23/18   Myles Lipps, MD  diclofenac sodium (VOLTAREN) 1 % GEL Apply 2 g topically daily as needed (for knee pain). 08/23/18   Myles Lipps, MD  DULoxetine (CYMBALTA) 60 MG capsule Take 1 capsule (60 mg total) by mouth daily. 08/23/18   Myles Lipps, MD  esomeprazole (NEXIUM) 40 MG capsule TAKE 1 CAPSULE EVERY DAY 08/23/18   Myles Lipps, MD  fluticasone [facility]) 50 MCG/ACT nasal spray PLACE 2 SPRAYS AT BEDTIME INTO BOTH NOSTRILS. Patient taking differently: Place 1 spray into both nostrils 2 (two) times a day.  08/23/18   Myles Lipps, MD  gabapentin (NEURONTIN) 600 MG tablet Take 1  tablet (600 mg total) by mouth 3 (three) times daily. 08/23/18   Myles Lipps, MD  hydrOXYzine (ATARAX/VISTARIL) 50 MG tablet Take 1 tablet (50 mg total) by mouth every 6 (six) hours as needed. Patient taking differently: Take 50 mg by mouth every 6 (six) hours as needed for itching.  08/23/18 11/17/18  Myles Lipps, MD  levothyroxine (SYNTHROID, LEVOTHROID) 50 MCG tablet Take 1 tablet (50 mcg total) by mouth daily before breakfast. 08/23/18   Myles Lipps, MD  meclizine (ANTIVERT) 25 MG tablet TAKE 1 TABLET BY MOUTH 3 TIMES DAILY AS NEEDED FOR DIZZINESS 08/23/18   Myles Lipps, MD  Melatonin 10 MG TABS Take 1 tablet by mouth daily.    [provider]  ondansetron (ZOFRAN-ODT) 8 MG disintegrating tablet Take 0.5-1 tablets (4-8 mg total) by mouth every 8 (eight) hours as needed for nausea (from migraines). 03/29/18   [name], MD  prazosin (MINIPRESS) 2 MG capsule TAKE 2 TABLETS BY MOUTH ALONG WITH THE 5MG  TABLET TO MAKE A TOTAL OF 9MG  DOSE AT BEDTIME 06/22/18   Myles Lipps, MD  prazosin (MINIPRESS) 5 MG capsule Take 2 capsules (10 mg total) by mouth at bedtime. TAKE 2 TO 3 CAPSULES AT BEDTIME 03/29/18   [name], MD  QUEtiapine (SEROQUEL) 50 MG tablet Take 1 tablet (50 mg total) by mouth at bedtime as needed. Patient taking differently: Take 50 mg by mouth at bedtime.  08/23/18   Myles Lipps, MD  rizatriptan (MAXALT-MLT) 10 MG disintegrating tablet Take 1 tablet (10 mg total) by mouth as needed for migraine. May repeat in 2 hours if needed 10/14/17   [name], MD     Positive ROS: none  All other systems have been reviewed and were otherwise negative with the exception of those mentioned in the HPI and as above.  Physical Exam: There were no vitals filed for this visit.  General: Alert, no acute distress Cardiovascular: No pedal edema Respiratory: No cyanosis, no use of accessory musculature GI: No organomegaly, abdomen is soft and non-tender Skin: No lesions in  the area of chief complaint Neurologic: Sensation intact distally Psychiatric: Patient is competent for consent with normal mood and affect Lymphatic: No axillary or cervical lymphadenopathy  MUSCULOSKELETAL: lleft shoulder: Limited range of motion.  Painful range of motion.  Weakness and index are rotation.  Neurovascular intact distally.  X-ray: MRI shows significant retraction of rotator cuff tear.  This includes supraspinatus, infraspinatus, and subscapularis.  Assessment/Plan: LEFT SHOULDER SCOPE, SUBACROMIAL DECOMPRESSION, DISTAL CLAVICLE RESECTION, OPEN ROTATOR CUFF REPAIR VS POSSIBLE DEBRIDEMENT Plan for Procedure(s): SHOULDER ARTHROSCOPY WITH SUBACROMIAL DECOMPRESSION SHOULDER ARTHROSCOPY WITH OPEN ROTATOR CUFF REPAIR POSSIBLE DEBRIDEMENT SHOULDER ARTHROSCOPY WITH DISTAL CLAVICLE RESECTION  The risks benefits and alternatives were discussed with the patient including but not limited to the risks of nonoperative treatment, versus surgical intervention including infection, bleeding, nerve injury, malunion, nonunion, hardware prominence, hardware failure, need for hardware removal, blood clots, cardiopulmonary complications, morbidity, mortality, among others, and they were willing to proceed.  Predicted outcome is good, although there will be at least a six to nine month expected recovery.  Harvie JuniorJohn L Leeanne Butters, MD 10/19/2018 6:58 PM

## 2018-10-20 ENCOUNTER — Ambulatory Visit (HOSPITAL_COMMUNITY): Payer: Medicare HMO | Admitting: Anesthesiology

## 2018-10-20 ENCOUNTER — Encounter (HOSPITAL_COMMUNITY)
Admission: RE | Disposition: A | Discharge status: Home / Self Care | Payer: Self-pay | Source: Other Acute Inpatient Hospital | Attending: Orthopedic Surgery

## 2018-10-20 ENCOUNTER — Ambulatory Visit (HOSPITAL_COMMUNITY)
Admission: RE | Admit: 2018-10-20 | Discharge: 2018-10-20 | Disposition: A | Discharge status: Home / Self Care | Payer: Medicare HMO | Source: Other Acute Inpatient Hospital | Attending: Orthopedic Surgery | Admitting: Orthopedic Surgery

## 2018-10-20 ENCOUNTER — Encounter (HOSPITAL_COMMUNITY): Payer: Self-pay | Admitting: Certified Registered Nurse Anesthetist

## 2018-10-20 ENCOUNTER — Ambulatory Visit (HOSPITAL_COMMUNITY): Payer: Medicare HMO | Admitting: Physician Assistant

## 2018-10-20 DIAGNOSIS — M75102 Unspecified rotator cuff tear or rupture of left shoulder, not specified as traumatic: Secondary | ICD-10-CM | POA: Diagnosis not present

## 2018-10-20 DIAGNOSIS — I130 Hypertensive heart and chronic kidney disease with heart failure and stage 1 through stage 4 chronic kidney disease, or unspecified chronic kidney disease: Secondary | ICD-10-CM | POA: Insufficient documentation

## 2018-10-20 DIAGNOSIS — N189 Chronic kidney disease, unspecified: Secondary | ICD-10-CM | POA: Insufficient documentation

## 2018-10-20 DIAGNOSIS — E039 Hypothyroidism, unspecified: Secondary | ICD-10-CM | POA: Diagnosis not present

## 2018-10-20 DIAGNOSIS — M12812 Other specific arthropathies, not elsewhere classified, left shoulder: Secondary | ICD-10-CM | POA: Diagnosis present

## 2018-10-20 DIAGNOSIS — M7542 Impingement syndrome of left shoulder: Secondary | ICD-10-CM | POA: Diagnosis not present

## 2018-10-20 DIAGNOSIS — G8918 Other acute postprocedural pain: Secondary | ICD-10-CM | POA: Diagnosis not present

## 2018-10-20 DIAGNOSIS — W19XXXA Unspecified fall, initial encounter: Secondary | ICD-10-CM | POA: Insufficient documentation

## 2018-10-20 DIAGNOSIS — G43909 Migraine, unspecified, not intractable, without status migrainosus: Secondary | ICD-10-CM | POA: Insufficient documentation

## 2018-10-20 DIAGNOSIS — F419 Anxiety disorder, unspecified: Secondary | ICD-10-CM | POA: Insufficient documentation

## 2018-10-20 DIAGNOSIS — M7522 Bicipital tendinitis, left shoulder: Secondary | ICD-10-CM | POA: Diagnosis not present

## 2018-10-20 DIAGNOSIS — Z1159 Encounter for screening for other viral diseases: Secondary | ICD-10-CM | POA: Diagnosis not present

## 2018-10-20 DIAGNOSIS — M19012 Primary osteoarthritis, left shoulder: Secondary | ICD-10-CM | POA: Diagnosis not present

## 2018-10-20 DIAGNOSIS — M7541 Impingement syndrome of right shoulder: Secondary | ICD-10-CM | POA: Insufficient documentation

## 2018-10-20 DIAGNOSIS — G709 Myoneural disorder, unspecified: Secondary | ICD-10-CM | POA: Insufficient documentation

## 2018-10-20 DIAGNOSIS — I5030 Unspecified diastolic (congestive) heart failure: Secondary | ICD-10-CM | POA: Diagnosis not present

## 2018-10-20 DIAGNOSIS — R0681 Apnea, not elsewhere classified: Secondary | ICD-10-CM | POA: Insufficient documentation

## 2018-10-20 DIAGNOSIS — M545 Low back pain: Secondary | ICD-10-CM | POA: Insufficient documentation

## 2018-10-20 DIAGNOSIS — K219 Gastro-esophageal reflux disease without esophagitis: Secondary | ICD-10-CM | POA: Insufficient documentation

## 2018-10-20 HISTORY — PX: SHOULDER ARTHROSCOPY WITH SUBACROMIAL DECOMPRESSION: SHX5684

## 2018-10-20 HISTORY — DX: Unspecified rotator cuff tear or rupture of left shoulder, not specified as traumatic: M12.812

## 2018-10-20 SURGERY — SHOULDER ARTHROSCOPY WITH SUBACROMIAL DECOMPRESSION
Anesthesia: General | Laterality: Left

## 2018-10-20 MED ORDER — PROPOFOL 10 MG/ML IV BOLUS
INTRAVENOUS | Status: AC
  Filled 2018-10-20: qty 20

## 2018-10-20 MED ORDER — BUPIVACAINE HCL (PF) 0.5 % IJ SOLN
INTRAMUSCULAR | Status: AC
  Filled 2018-10-20: qty 30

## 2018-10-20 MED ORDER — MIDAZOLAM HCL 2 MG/2ML IJ SOLN
1.0000 mg | INTRAMUSCULAR | Status: DC
  Administered 2018-10-20: 2 mg via INTRAVENOUS

## 2018-10-20 MED ORDER — EPHEDRINE SULFATE 50 MG/ML IJ SOLN
INTRAMUSCULAR | Status: DC | PRN
  Administered 2018-10-20: 5 mg via INTRAVENOUS

## 2018-10-20 MED ORDER — MIDAZOLAM HCL 2 MG/2ML IJ SOLN
INTRAMUSCULAR | Status: AC
  Administered 2018-10-20: 2 mg via INTRAVENOUS
  Filled 2018-10-20: qty 2

## 2018-10-20 MED ORDER — CEFAZOLIN SODIUM-DEXTROSE 2-4 GM/100ML-% IV SOLN
2.0000 g | INTRAVENOUS | Status: AC
  Administered 2018-10-20: 14:00:00 2 g via INTRAVENOUS
  Filled 2018-10-20: qty 100

## 2018-10-20 MED ORDER — SUCCINYLCHOLINE CHLORIDE 200 MG/10ML IV SOSY
PREFILLED_SYRINGE | INTRAVENOUS | Status: AC
  Filled 2018-10-20: qty 10

## 2018-10-20 MED ORDER — SUCCINYLCHOLINE CHLORIDE 200 MG/10ML IV SOSY
PREFILLED_SYRINGE | INTRAVENOUS | Status: DC | PRN
  Administered 2018-10-20: 140 mg via INTRAVENOUS

## 2018-10-20 MED ORDER — EPHEDRINE 5 MG/ML INJ
INTRAVENOUS | Status: AC
  Filled 2018-10-20: qty 10

## 2018-10-20 MED ORDER — DEXAMETHASONE SODIUM PHOSPHATE 10 MG/ML IJ SOLN
INTRAMUSCULAR | Status: DC | PRN
  Administered 2018-10-20: 10 mg via INTRAVENOUS

## 2018-10-20 MED ORDER — LIDOCAINE 2% (20 MG/ML) 5 ML SYRINGE
INTRAMUSCULAR | Status: DC | PRN
  Administered 2018-10-20: 80 mg via INTRAVENOUS

## 2018-10-20 MED ORDER — KETOROLAC TROMETHAMINE 30 MG/ML IJ SOLN: 30.0000 mg | Freq: Once | INTRAMUSCULAR | Status: DC | PRN

## 2018-10-20 MED ORDER — MEPERIDINE HCL 50 MG/ML IJ SOLN: 6.2500 mg | INTRAMUSCULAR | Status: DC | PRN

## 2018-10-20 MED ORDER — LIDOCAINE 2% (20 MG/ML) 5 ML SYRINGE
INTRAMUSCULAR | Status: AC
  Filled 2018-10-20: qty 5

## 2018-10-20 MED ORDER — PROPOFOL 10 MG/ML IV BOLUS
INTRAVENOUS | Status: DC | PRN
  Administered 2018-10-20: 150 mg via INTRAVENOUS

## 2018-10-20 MED ORDER — SODIUM CHLORIDE 0.9 % IR SOLN
Status: DC | PRN
  Administered 2018-10-20: 3000 mL
  Administered 2018-10-20: 6000 mL

## 2018-10-20 MED ORDER — CHLORHEXIDINE GLUCONATE 4 % EX LIQD: 60.0000 mL | Freq: Once | CUTANEOUS | Status: DC

## 2018-10-20 MED ORDER — PHENYLEPHRINE 40 MCG/ML (10ML) SYRINGE FOR IV PUSH (FOR BLOOD PRESSURE SUPPORT)
PREFILLED_SYRINGE | INTRAVENOUS | Status: DC | PRN
  Administered 2018-10-20 (×4): 80 ug via INTRAVENOUS

## 2018-10-20 MED ORDER — BUPIVACAINE LIPOSOME 1.3 % IJ SUSP
INTRAMUSCULAR | Status: DC | PRN
  Administered 2018-10-20 (×5): 2 mL via PERINEURAL

## 2018-10-20 MED ORDER — EPINEPHRINE PF 1 MG/ML IJ SOLN
INTRAMUSCULAR | Status: DC | PRN
  Administered 2018-10-20: 2 mg

## 2018-10-20 MED ORDER — EPINEPHRINE PF 1 MG/ML IJ SOLN
INTRAMUSCULAR | Status: AC
  Filled 2018-10-20: qty 2

## 2018-10-20 MED ORDER — ONDANSETRON HCL 4 MG/2ML IJ SOLN
INTRAMUSCULAR | Status: DC | PRN
  Administered 2018-10-20: 4 mg via INTRAVENOUS

## 2018-10-20 MED ORDER — FENTANYL CITRATE (PF) 100 MCG/2ML IJ SOLN
50.0000 ug | INTRAMUSCULAR | Status: DC
  Administered 2018-10-20: 13:00:00 100 ug via INTRAVENOUS

## 2018-10-20 MED ORDER — ROCURONIUM BROMIDE 10 MG/ML (PF) SYRINGE
PREFILLED_SYRINGE | INTRAVENOUS | Status: DC | PRN
  Administered 2018-10-20: 3 mg via INTRAVENOUS

## 2018-10-20 MED ORDER — FENTANYL CITRATE (PF) 100 MCG/2ML IJ SOLN
INTRAMUSCULAR | Status: AC
  Filled 2018-10-20: qty 2

## 2018-10-20 MED ORDER — TIZANIDINE HCL 2 MG PO TABS: 2.0000 mg | ORAL_TABLET | Freq: Three times a day (TID) | ORAL | 0 refills | Status: DC | PRN

## 2018-10-20 MED ORDER — FENTANYL CITRATE (PF) 100 MCG/2ML IJ SOLN
INTRAMUSCULAR | Status: AC
  Administered 2018-10-20: 100 ug via INTRAVENOUS
  Filled 2018-10-20: qty 2

## 2018-10-20 MED ORDER — FENTANYL CITRATE (PF) 100 MCG/2ML IJ SOLN
INTRAMUSCULAR | Status: DC | PRN
  Administered 2018-10-20 (×2): 25 ug via INTRAVENOUS
  Administered 2018-10-20: 50 ug via INTRAVENOUS

## 2018-10-20 MED ORDER — ONDANSETRON HCL 4 MG/2ML IJ SOLN
INTRAMUSCULAR | Status: AC
  Filled 2018-10-20: qty 2

## 2018-10-20 MED ORDER — HYDROMORPHONE HCL 1 MG/ML IJ SOLN: 0.2500 mg | INTRAMUSCULAR | Status: DC | PRN

## 2018-10-20 MED ORDER — PHENYLEPHRINE 40 MCG/ML (10ML) SYRINGE FOR IV PUSH (FOR BLOOD PRESSURE SUPPORT)
PREFILLED_SYRINGE | INTRAVENOUS | Status: AC
  Filled 2018-10-20: qty 10

## 2018-10-20 MED ORDER — DEXAMETHASONE SODIUM PHOSPHATE 10 MG/ML IJ SOLN
INTRAMUSCULAR | Status: AC
  Filled 2018-10-20: qty 1

## 2018-10-20 MED ORDER — PROMETHAZINE HCL 25 MG/ML IJ SOLN: 6.2500 mg | INTRAMUSCULAR | Status: DC | PRN

## 2018-10-20 MED ORDER — LACTATED RINGERS IV SOLN
INTRAVENOUS | Status: DC
  Administered 2018-10-20 (×2): via INTRAVENOUS

## 2018-10-20 MED ORDER — OXYCODONE-ACETAMINOPHEN 5-325 MG PO TABS: 1.0000 | ORAL_TABLET | Freq: Four times a day (QID) | ORAL | 0 refills | Status: DC | PRN

## 2018-10-20 MED ORDER — BUPIVACAINE-EPINEPHRINE (PF) 0.5% -1:200000 IJ SOLN
INTRAMUSCULAR | Status: DC | PRN
  Administered 2018-10-20 (×5): 3 mL via PERINEURAL

## 2018-10-20 SURGICAL SUPPLY — 79 items
AID PSTN UNV HD RSTRNT DISP (MISCELLANEOUS) ×1
APL SKNCLS STERI-STRIP NONHPOA (GAUZE/BANDAGES/DRESSINGS)
BENZOIN TINCTURE PRP APPL 2/3 (GAUZE/BANDAGES/DRESSINGS) IMPLANT
BLADE SURG 15 STRL LF DISP TIS (BLADE) IMPLANT
BLADE SURG 15 STRL SS (BLADE)
BLADE VORTEX 6.0 (BLADE) IMPLANT
BUR VERTEX HOODED 4.5 (BURR) IMPLANT
BURR OVAL 8 FLU 5.0MM X 13CM (MISCELLANEOUS)
BURR OVAL 8 FLU 5.0X13 (MISCELLANEOUS) IMPLANT
CANNULA 5.75X71 LONG (CANNULA) IMPLANT
CANNULA TWIST IN 8.25X7CM (CANNULA) IMPLANT
CLOSURE WOUND 1/2 X4 (GAUZE/BANDAGES/DRESSINGS)
COVER WAND RF STERILE (DRAPES) IMPLANT
DECANTER SPIKE VIAL GLASS SM (MISCELLANEOUS) IMPLANT
DISSECTOR 4.0MM X 13CM (MISCELLANEOUS) ×2 IMPLANT
DRAPE IMP U-DRAPE 54X76 (DRAPES) ×6 IMPLANT
DRAPE INCISE IOBAN 66X45 STRL (DRAPES) ×3 IMPLANT
DRAPE STERI 35X30 U-POUCH (DRAPES) ×3 IMPLANT
DRAPE SURG 17X23 STRL (DRAPES) IMPLANT
DRAPE U-SHAPE 47X51 STRL (DRAPES) ×3 IMPLANT
DRSG EMULSION OIL 3X3 NADH (GAUZE/BANDAGES/DRESSINGS) ×3 IMPLANT
DRSG PAD ABDOMINAL 8X10 ST (GAUZE/BANDAGES/DRESSINGS) ×4 IMPLANT
DURAPREP 26ML APPLICATOR (WOUND CARE) ×3 IMPLANT
ELECT PENCIL ROCKER SW 15FT (MISCELLANEOUS) IMPLANT
ELECT REM PT RETURN 15FT ADLT (MISCELLANEOUS) ×3 IMPLANT
GAUZE SPONGE 4X4 12PLY STRL (GAUZE/BANDAGES/DRESSINGS) ×3 IMPLANT
GLOVE BIOGEL PI IND STRL 7.5 (GLOVE) IMPLANT
GLOVE BIOGEL PI IND STRL 8 (GLOVE) ×2 IMPLANT
GLOVE BIOGEL PI INDICATOR 7.5 (GLOVE) ×4
GLOVE BIOGEL PI INDICATOR 8 (GLOVE) ×2
GLOVE ECLIPSE 7.5 STRL STRAW (GLOVE) ×4 IMPLANT
GLOVE SURG SS PI 7.0 STRL IVOR (GLOVE) ×2 IMPLANT
GOWN STRL REUS W/ TWL XL LVL3 (GOWN DISPOSABLE) ×1 IMPLANT
GOWN STRL REUS W/TWL XL LVL3 (GOWN DISPOSABLE) ×6 IMPLANT
KIT BASIN OR (CUSTOM PROCEDURE TRAY) ×3 IMPLANT
MANIFOLD NEPTUNE II (INSTRUMENTS) ×3 IMPLANT
NDL 1/2 CIR CATGUT .05X1.09 (NEEDLE) IMPLANT
NDL SAFETY ECLIPSE 18X1.5 (NEEDLE) ×1 IMPLANT
NDL SCORPION MULTI FIRE (NEEDLE) IMPLANT
NDL SUT 6 .5 CRC .975X.05 MAYO (NEEDLE) ×1 IMPLANT
NEEDLE 1/2 CIR CATGUT .05X1.09 (NEEDLE) IMPLANT
NEEDLE HYPO 18GX1.5 SHARP (NEEDLE) ×3
NEEDLE MAYO TAPER (NEEDLE) ×3
NEEDLE SCORPION MULTI FIRE (NEEDLE) IMPLANT
PACK ARTHROSCOPY DSU (CUSTOM PROCEDURE TRAY) ×3 IMPLANT
PASSER SUT SWANSON 36MM LOOP (INSTRUMENTS) IMPLANT
PORT APPOLLO RF 90DEGREE MULTI (SURGICAL WAND) ×3 IMPLANT
RESTRAINT HEAD UNIVERSAL NS (MISCELLANEOUS) ×3 IMPLANT
SET IRRIG Y TYPE TUR BLADDER L (SET/KITS/TRAYS/PACK) ×3 IMPLANT
SLEEVE SCD COMPRESS KNEE MED (MISCELLANEOUS) ×1 IMPLANT
SLING ARM FOAM STRAP LRG (SOFTGOODS) IMPLANT
SLING ARM FOAM STRAP MED (SOFTGOODS) ×2 IMPLANT
SLING ARM FOAM STRAP XLG (SOFTGOODS) IMPLANT
SLING ARM IMMOBILIZER LRG (SOFTGOODS) IMPLANT
SPONGE LAP 4X18 RFD (DISPOSABLE) IMPLANT
STRIP CLOSURE SKIN 1/2X4 (GAUZE/BANDAGES/DRESSINGS) IMPLANT
SUCTION FRAZIER HANDLE 10FR (MISCELLANEOUS)
SUCTION TUBE FRAZIER 10FR DISP (MISCELLANEOUS) IMPLANT
SUT ETHIBOND 2 OS 4 DA (SUTURE) IMPLANT
SUT ETHILON 4 0 PS 2 18 (SUTURE) IMPLANT
SUT FIBERWIRE #2 38 T-5 BLUE (SUTURE)
SUT MNCRL AB 3-0 PS2 18 (SUTURE) IMPLANT
SUT PDS AB 0 CT 36 (SUTURE) IMPLANT
SUT TICRON (SUTURE) IMPLANT
SUT TIGER TAPE 7 IN WHITE (SUTURE) IMPLANT
SUT VIC AB 0 CT1 27 (SUTURE)
SUT VIC AB 0 CT1 27XBRD ANBCTR (SUTURE) IMPLANT
SUT VIC AB 1 CT1 27 (SUTURE)
SUT VIC AB 1 CT1 27XBRD ANBCTR (SUTURE) IMPLANT
SUT VIC AB 2-0 CT1 27 (SUTURE)
SUT VIC AB 2-0 CT1 TAPERPNT 27 (SUTURE) IMPLANT
SUT VIC AB 2-0 SH 27 (SUTURE)
SUT VIC AB 2-0 SH 27XBRD (SUTURE) IMPLANT
SUTURE FIBERWR #2 38 T-5 BLUE (SUTURE) IMPLANT
SYR 3ML LL SCALE MARK (SYRINGE) ×3 IMPLANT
TAPE FIBER 2MM 7IN #2 BLUE (SUTURE) IMPLANT
TUBING CONNECTING 10 (TUBING) ×3 IMPLANT
TUBING CONNECTING 10' (TUBING) ×2
YANKAUER SUCT BULB TIP NO VENT (SUCTIONS) IMPLANT

## 2018-10-20 NOTE — Brief Op Note (Signed)
10/20/2018  3:25 PM  PATIENT:  Jennifer Davidson  63 y.o. female  PRE-OPERATIVE DIAGNOSIS:  LEFT SHOULDER SCOPE, SUBACROMIAL DECOMPRESSION, DISTAL CLAVICLE RESECTION, OPEN ROTATOR CUFF REPAIR VS POSSIBLE DEBRIDEMENT  POST-OPERATIVE DIAGNOSIS:  LEFT SHOULDER SCOPE, SUBACROMIAL DECOMPRESSION, DISTAL CLAVICLE RESECTION  PROCEDURE:  Procedure(s): SHOULDER ARTHROSCOPY WITH SUBACROMIAL DECOMPRESSION (Left) SHOULDER ARTHROSCOPY WITH DISTAL CLAVICLE RESECTION (Left)  SURGEON:  Surgeon(s) and Role:    Jodi Geralds, MD - Primary  PHYSICIAN ASSISTANT:   ASSISTANTS: jim bethune   ANESTHESIA:   regional + general  EBL:  50 mL   BLOOD ADMINISTERED:none  DRAINS: none   LOCAL MEDICATIONS USED:  NONE  SPECIMEN:  No Specimen  DISPOSITION OF SPECIMEN:  N/A  COUNTS:  YES  TOURNIQUET:  * No tourniquets in log *  DICTATION: .Other Dictation: Dictation Number [id number]  PLAN OF CARE: Discharge to home after PACU  PATIENT DISPOSITION:  PACU - hemodynamically stable.   Delay start of Pharmacological VTE agent (>24hrs) due to surgical blood loss or risk of bleeding: no

## 2018-10-20 NOTE — Anesthesia Procedure Notes (Signed)
Anesthesia Regional Block: Interscalene brachial plexus block   Pre-Anesthetic Checklist: ,, timeout performed, Correct Patient, Correct Site, Correct Laterality, Correct Procedure, Correct Position, site marked, Risks and benefits discussed,  Surgical consent,  Pre-op evaluation,  At surgeon's request and post-op pain management  Laterality: Left and Upper  Prep: chloraprep       Needles:  Injection technique: Single-shot  Needle Type: Echogenic Stimulator Needle     Needle Length: 10cm  Needle Gauge: 21   Needle insertion depth: 1.5 cm   Additional Needles:   Procedures:,,,, ultrasound used (permanent image in chart),,,,  Narrative:  Start time: 10/20/2018 1:30 PM End time: 10/20/2018 1:40 PM Injection made incrementally with aspirations every 5 mL.  Performed by: Personally  Anesthesiologist: Leilani Able, MD

## 2018-10-20 NOTE — Transfer of Care (Signed)
Immediate Anesthesia Transfer of Care Note  Patient: AVICE KITTLESON  Procedure(s) Performed: SHOULDER ARTHROSCOPY, ROTATOR CUFF DEBRIDEMENT, GLENOHUMERAL JOINT DEBRIDEMENT, ACROMIOPLASTY, DISTAL CLAVICLE RESECTION (Left )  Patient Location: PACU  Anesthesia Type:General  Level of Consciousness: awake, oriented and patient cooperative  Airway & Oxygen Therapy: Patient Spontanous Breathing and Patient connected to face mask oxygen  Post-op Assessment: Report given to RN and Post -op Vital signs reviewed and stable  Post vital signs: Reviewed and stable  Last Vitals:  Vitals Value Taken Time  BP 117/73 10/20/2018  3:45 PM  Temp    Pulse 75 10/20/2018  3:46 PM  Resp 16 10/20/2018  3:46 PM  SpO2 90 % 10/20/2018  3:46 PM  Vitals shown include unvalidated device data.  Last Pain:  Vitals:   10/20/18 1254  PainSc: 8       Patients Stated Pain Goal: 5 (10/20/18 1254)  Complications: No apparent anesthesia complications

## 2018-10-20 NOTE — Anesthesia Postprocedure Evaluation (Signed)
Anesthesia Post Note  Patient: Jennifer Davidson  Procedure(s) Performed: SHOULDER ARTHROSCOPY, ROTATOR CUFF DEBRIDEMENT, GLENOHUMERAL JOINT DEBRIDEMENT, ACROMIOPLASTY, DISTAL CLAVICLE RESECTION (Left )     Patient location during evaluation: PACU Anesthesia Type: General Level of consciousness: awake and alert, awake and oriented Pain management: pain level controlled Vital Signs Assessment: post-procedure vital signs reviewed and stable Respiratory status: spontaneous breathing, nonlabored ventilation, respiratory function stable and patient connected to nasal cannula oxygen Cardiovascular status: blood pressure returned to baseline and stable Postop Assessment: no apparent nausea or vomiting Anesthetic complications: no    Last Vitals:  Vitals:   10/20/18 1615 10/20/18 1630  BP: (!) 145/84 134/74  Pulse: 65 65  Resp: 12 17  Temp:    SpO2: 93% 94%    Last Pain:  Vitals:   10/20/18 1630  PainSc: 0-No pain                 Cecile Hearing

## 2018-10-20 NOTE — Interval H&P Note (Signed)
History and Physical Interval Note:  10/20/2018 12:46 PM  Jennifer Davidson  has presented today for surgery, with the diagnosis of LEFT SHOULDER SCOPE, SUBACROMIAL DECOMPRESSION, DISTAL CLAVICLE RESECTION, OPEN ROTATOR CUFF REPAIR VS POSSIBLE DEBRIDEMENT.  The various methods of treatment have been discussed with the patient and family. After consideration of risks, benefits and other options for treatment, the patient has consented to  Procedure(s): SHOULDER ARTHROSCOPY WITH SUBACROMIAL DECOMPRESSION (Left) SHOULDER ARTHROSCOPY WITH OPEN ROTATOR CUFF REPAIR POSSIBLE DEBRIDEMENT (Left) SHOULDER ARTHROSCOPY WITH DISTAL CLAVICLE RESECTION (Left) as a surgical intervention.  The patient's history has been reviewed, patient examined, no change in status, stable for surgery.  I have reviewed the patient's chart and labs.  Questions were answered to the patient's satisfaction.     [name]

## 2018-10-20 NOTE — Discharge Instructions (Signed)

## 2018-10-20 NOTE — Op Note (Signed)
NAME: GRADY, VILLADA MEDICAL RECORD [id number] ACCOUNT [id number] DATE OF BIRTH:[birth year] FACILITY: [facility] LOCATION: [facility] PHYSICIAN:Daouda Lonzo L. Agapito Hanway, MD  OPERATIVE REPORT  DATE OF PROCEDURE:  10/20/2018  PREOPERATIVE DIAGNOSES:  Massive retracted rotator cuff tear with impingement, acromioclavicular joint arthritis and biceps tendon issues.  POSTOPERATIVE DIAGNOSES:  Massive retracted rotator cuff tear with impingement, acromioclavicular joint arthritis and biceps tendon issues with the addition of glenohumeral joint arthritis, severe, and severe labral pathology.  PRINCIPAL PROCEDURE: 1.  Subacromial decompression. 2.  Distal clavicle resection over 20 mm. 3.  Biceps tenotomy. 4.  Extensive debridement within the glenohumeral joint of the humeral head and glenoid, anterior, superior and posterior labrum, and rotator cuff edge.  SURGEON:  Jodi Geralds, MD  ASSISTANT:  Gus Puma [name], who was present for the entire case and assisted by retraction and closing to minimize OR time.  BRIEF HISTORY:  The patient is a 63 year old female with a long history of significant complaints of right shoulder pain.  She has not had any previous left shoulder pain.  She had not had any previous shoulder problems up until a fall recently.  MRI was  obtained, which showed a severe retracted rotator cuff tear with biceps tendon pathology.  She did not want to proceed with any kind of arthroplasty, and after failure of conservative care, we felt that we might be able to help her with a debridement.   She was taken to the operating room for this procedure.  DESCRIPTION OF PROCEDURE:  The patient was taken to the operating room after adequate anesthesia was obtained with a general anesthetic.  The patient was placed on the operating table and moved into beach-chair position.  All bony prominences were well  padded.  Attention was turned to the left shoulder after routine prep and drape.  Arthroscopic  examination showed that there was severe glenohumeral arthritis of the humeral head and the glenoid.  We debrided this thoroughly.  The labrum anteriorly was  completely torn off, as well as the subscap, supraspinatus and infraspinatus.  Biceps tendon was anteriorly subluxed.  We did a biceps tendon tenotomy and then did a debridement of the superior labrum, anterior to posterior.  We also debrided the humeral  head and the glenoid.  Once that was completed, we went up into the subacromial space, really just moved the camera from the subacromial space and did an anterolateral acromioplasty, did just a clavicle resection over 20 mm and then did a thorough  debridement of the rotator cuff edge, which was completely torn and retracted with no mobility.  Once this was done, we did a thorough debridement of the joint and cleaned it out one last time.  The shoulder then had a sterile compressive dressing  applied, and the patient was taken to recovery.  She was noted to be in satisfactory condition.  Estimated blood loss for the procedure was minimal.  LN/NUANCE  D:10/20/2018 T:10/20/2018 JOB:[id number]

## 2018-10-20 NOTE — Progress Notes (Signed)
Assisted Dr. Hatchett with left, ultrasound guided, supraclavicular block. Side rails up, monitors on throughout procedure. See vital signs in flow sheet. Tolerated Procedure well. 

## 2018-10-20 NOTE — Anesthesia Procedure Notes (Signed)
Procedure Name: Intubation Date/Time: 10/20/2018 2:12 PM Performed by: [place] Julliana Whitmyer C, CRNA Pre-anesthesia Checklist: Patient identified, Emergency Drugs available, Suction available and Patient being monitored Patient Re-evaluated:Patient Re-evaluated prior to induction Oxygen Delivery Method: Circle system utilized Preoxygenation: Pre-oxygenation with 100% oxygen Induction Type: IV induction Ventilation: Mask ventilation without difficulty Laryngoscope Size: Mac and 3 Grade View: Grade I Tube type: Oral Tube size: 7.0 mm Number of attempts: 1 Airway Equipment and Method: Stylet and Oral airway Placement Confirmation: ETT inserted through vocal cords under direct vision,  positive ETCO2 and breath sounds checked- equal and bilateral Secured at: 21 cm Tube secured with: Tape Dental Injury: Teeth and Oropharynx as per pre-operative assessment

## 2018-10-21 ENCOUNTER — Encounter (HOSPITAL_COMMUNITY): Payer: Self-pay | Admitting: Orthopedic Surgery

## 2018-10-26 ENCOUNTER — Encounter: Payer: Self-pay | Admitting: Family Medicine

## 2018-10-26 ENCOUNTER — Other Ambulatory Visit: Payer: Self-pay

## 2018-10-26 ENCOUNTER — Ambulatory Visit (INDEPENDENT_AMBULATORY_CARE_PROVIDER_SITE_OTHER): Payer: Medicare HMO | Admitting: Family Medicine

## 2018-10-26 VITALS — BP 114/67 | HR 75 | Temp 97.7°F | Ht 63.0 in | Wt 209.2 lb

## 2018-10-26 DIAGNOSIS — E038 Other specified hypothyroidism: Secondary | ICD-10-CM | POA: Diagnosis not present

## 2018-10-26 DIAGNOSIS — F411 Generalized anxiety disorder: Secondary | ICD-10-CM | POA: Diagnosis not present

## 2018-10-26 DIAGNOSIS — F339 Major depressive disorder, recurrent, unspecified: Secondary | ICD-10-CM | POA: Diagnosis not present

## 2018-10-26 DIAGNOSIS — R7303 Prediabetes: Secondary | ICD-10-CM

## 2018-10-26 DIAGNOSIS — Z8701 Personal history of pneumonia (recurrent): Secondary | ICD-10-CM

## 2018-10-26 DIAGNOSIS — E782 Mixed hyperlipidemia: Secondary | ICD-10-CM | POA: Diagnosis not present

## 2018-10-26 DIAGNOSIS — Z9889 Other specified postprocedural states: Secondary | ICD-10-CM | POA: Diagnosis not present

## 2018-10-26 LAB — LIPID PANEL

## 2018-10-26 MED ORDER — BUPROPION HCL ER (XL) 150 MG PO TB24: 150.0000 mg | ORAL_TABLET | Freq: Every day | ORAL | 2 refills | Status: DC

## 2018-10-26 MED ORDER — QUETIAPINE FUMARATE 300 MG PO TABS: 300.0000 mg | ORAL_TABLET | Freq: Every day | ORAL | 1 refills | Status: DC

## 2018-10-26 MED ORDER — QUETIAPINE FUMARATE 50 MG PO TABS: 50.0000 mg | ORAL_TABLET | Freq: Every evening | ORAL | 0 refills | Status: DC | PRN

## 2018-10-26 NOTE — Patient Instructions (Signed)
° ° ° °  If you have lab work done today you will be contacted with your lab results within the next 2 weeks.  If you have not heard from us then please contact us. The fastest way to get your results is to register for My Chart. ° ° °IF you received an x-ray today, you will receive an invoice from [facility]. Please contact [facility] at [phone] with questions or concerns regarding your invoice.  ° °IF you received labwork today, you will receive an invoice from [facility]. Please contact [facility] at [phone] with questions or concerns regarding your invoice.  ° °Our billing staff will not be able to assist you with questions regarding bills from these companies. ° °You will be contacted with the lab results as soon as they are available. The fastest way to get your results is to activate your My Chart account. Instructions are located on the last page of this paperwork. If you have not heard from us regarding the results in 2 weeks, please contact this office. °  ° ° ° °

## 2018-10-26 NOTE — Progress Notes (Signed)
6/11/20209:06 AM  Jennifer Davidson December 19, 1955, 63 y.o., female [id number]  Chief Complaint  Patient presents with  . Follow-up    no longer coughing and head injury caused from fall, feelfing alot better. Judt had surgery last Fri for left arm for rotator cuff.    HPI:   Patient is a 64 y.o. female with past medical history significant for hypothyroidism, anxiety, depression, HLP, prediabetes, OSA on cpap who presents today for followup  Last OV Jan 2020 [facility] 5/11-13 for CAP covid neg Just had left shoulder rotator cuff surgery a week ago Sees ortho later today Feels that depression is worse since covid Has had 2 recent falls, mechanical   Denies any fever and chills Cough has resolved, breathing back to normal Uses cpap every night Denies any chest pain, palpitations, edema Has temperature instability, no changes in bowel function, noticing really dry nails but no changes in hair and skin Denies any increased thirst or urination, denies any incontienence  Has been on current behavioral health medications for past 3 years Used to do counseling, had significant issues with insurance coverage Saw psychiatry several years, does not feel that was a good experience  Since surgery has been having worsening dreams Needs refill of seroquel, takes 300mg  at bedtime and will take an extra 50mg  if she awakens  Wt Readings from Last 3 Encounters:  10/26/18 209 lb 3.2 oz (94.9 kg)  10/20/18 209 lb 1 oz (94.8 kg)  10/17/18 209 lb 1 oz (94.8 kg)    Fall Risk  10/26/2018 09/22/2018 09/19/2018 09/07/2018 08/23/2018  Falls in the past year? 1 0 1 1 1   Comment - - 4 weeks ago - -  Number falls in past yr: 1 0 0 1 0  Injury with Fall? 1 0 1 1 1   Comment - - - - -  Risk Factor Category  - - - - -  Risk for fall due to : - - - History of fall(s);Impaired balance/gait -  Risk for fall due to: Comment - - - - -  Follow up - - Falls evaluation completed Falls evaluation completed -     Depression  screen [facility] 2/9 10/26/2018 09/25/2018 09/22/2018  Decreased Interest 1 1 0  Down, Depressed, Hopeless 1 1 2   PHQ - 2 Score 2 2 2   Altered sleeping 2 1 2   Tired, decreased energy 2 1 2   Change in appetite 2 2 2   Feeling bad or failure about yourself  1 0 0  Trouble concentrating 2 0 0  Moving slowly or fidgety/restless 2 0 0  Suicidal thoughts 0 0 0  PHQ-9 Score 13 6 8   Difficult doing work/chores Somewhat difficult Not difficult at all Not difficult at all  Some recent data might be hidden   GAD 7 : Generalized Anxiety Score 10/26/2018 12/22/2017  Nervous, Anxious, on Edge 2 3  Control/stop worrying 2 3  Worry too much - different things 2 3  Trouble relaxing 2 1  Restless 2 3  Easily annoyed or irritable 1 2  Afraid - awful might happen 2 3  Total GAD 7 Score 13 18  Anxiety Difficulty Somewhat difficult Not difficult at all     Allergies  Allergen Reactions  . Tramadol Nausea And Vomiting  . Trazodone And Nefazodone Other (See Comments)    Per pt trazodone caused hallucinations and behavior changes     Prior to Admission medications   Medication Sig Start Date End Date Taking? Authorizing Provider  busPIRone (BUSPAR) 5 MG tablet Take 1 tablet (5 mg total) by mouth 3 (three) times daily. May take 4th dose in evening for anxiety exacerbation 08/23/18  Yes Myles LippsSantiago, Irma M, MD  diclofenac sodium (VOLTAREN) 1 % GEL Apply 2 g topically daily as needed (for knee pain). 08/23/18  Yes Myles LippsSantiago, Irma M, MD  DULoxetine (CYMBALTA) 60 MG capsule Take 1 capsule (60 mg total) by mouth daily. 08/23/18  Yes Myles LippsSantiago, Irma M, MD  esomeprazole (NEXIUM) 40 MG capsule TAKE 1 CAPSULE EVERY DAY 08/23/18  Yes Myles LippsSantiago, Irma M, MD  fluticasone (FLONASE) 50 MCG/ACT nasal spray PLACE 2 SPRAYS AT BEDTIME INTO BOTH NOSTRILS. Patient taking differently: Place 1 spray into both nostrils 2 (two) times a day.  08/23/18  Yes Myles LippsSantiago, Irma M, MD  gabapentin (NEURONTIN) 600 MG tablet Take 1 tablet (600 mg total) by mouth 3  (three) times daily. 08/23/18  Yes Myles LippsSantiago, Irma M, MD  hydrOXYzine (ATARAX/VISTARIL) 50 MG tablet Take 1 tablet (50 mg total) by mouth every 6 (six) hours as needed. Patient taking differently: Take 50 mg by mouth every 6 (six) hours as needed for itching.  08/23/18 11/17/18 Yes Myles LippsSantiago, Irma M, MD  levothyroxine (SYNTHROID, LEVOTHROID) 50 MCG tablet Take 1 tablet (50 mcg total) by mouth daily before breakfast. 08/23/18  Yes Myles LippsSantiago, Irma M, MD  meclizine (ANTIVERT) 25 MG tablet TAKE 1 TABLET BY MOUTH 3 TIMES DAILY AS NEEDED FOR DIZZINESS 08/23/18  Yes Myles LippsSantiago, Irma M, MD  Melatonin 10 MG TABS Take 1 tablet by mouth daily.   Yes [provider]  ondansetron (ZOFRAN-ODT) 8 MG disintegrating tablet Take 0.5-1 tablets (4-8 mg total) by mouth every 8 (eight) hours as needed for nausea (from migraines). 03/29/18  Yes Sherren MochaShaw, Eva N, MD  prazosin (MINIPRESS) 2 MG capsule TAKE 2 TABLETS BY MOUTH ALONG WITH THE 5MG  TABLET TO MAKE A TOTAL OF 9MG  DOSE AT BEDTIME 06/22/18  Yes Myles LippsSantiago, Irma M, MD  prazosin (MINIPRESS) 5 MG capsule Take 2 capsules (10 mg total) by mouth at bedtime. TAKE 2 TO 3 CAPSULES AT BEDTIME 03/29/18  Yes Sherren MochaShaw, Eva N, MD  QUEtiapine (SEROQUEL) 50 MG tablet Take 1 tablet (50 mg total) by mouth at bedtime as needed. Patient taking differently: Take 50 mg by mouth at bedtime.  08/23/18  Yes Myles LippsSantiago, Irma M, MD  rizatriptan (MAXALT-MLT) 10 MG disintegrating tablet Take 1 tablet (10 mg total) by mouth as needed for migraine. May repeat in 2 hours if needed 10/14/17  Yes Sherren MochaShaw, Eva N, MD    Past Medical History:  Diagnosis Date  . Acute encephalopathy 06/25/2016  . Anxiety   . Cataract 08/31/2017   left eye  . Cholesterol serum increased   . Chronic back pain    chronic Rt low back pain. s/p L4-5 fusion. failed Rt facet injections. poss due to Rt SI joint dysfunction.  . Chronic kidney disease   . Depression   . Diastolic heart failure (HCC)   . GERD (gastroesophageal reflux disease)   .  Hyperparathyroidism (HCC)   . Hypertension   . Hypothyroidism   . Migraine   . Neuromuscular disorder (HCC)   . Sleep apnea   . Syncope 08/30/2015  . Tachycardia   . Trochanteric bursitis of both hips 2012   Confirmed on MRI    Past Surgical History:  Procedure Laterality Date  . ABDOMINAL HYSTERECTOMY    . APPENDECTOMY  1986  . BACK SURGERY     L4-5 fusion  .  CATARACT EXTRACTION    . KNEE SURGERY    . SHOULDER ARTHROSCOPY WITH SUBACROMIAL DECOMPRESSION Left 10/20/2018   Procedure: SHOULDER ARTHROSCOPY, ROTATOR CUFF DEBRIDEMENT, GLENOHUMERAL JOINT DEBRIDEMENT, ACROMIOPLASTY, DISTAL CLAVICLE RESECTION;  Surgeon: Jodi GeraldsGraves, John, MD;  Location: [facility];  Service: Orthopedics;  Laterality: Left;    Social History   Tobacco Use  . Smoking status: Never Smoker  . Smokeless tobacco: Never Used  Substance Use Topics  . Alcohol use: No    Family History  Problem Relation Age of Onset  . Diabetes Mother   . Heart disease Mother   . Kidney disease Mother   . Thyroid disease Mother   . Heart disease Father   . Seizures Father   . COPD Father   . Irritable bowel syndrome Father   . Cancer Maternal Grandmother   . Diabetes Sister   . Diabetes Brother   . Colon cancer Neg Hx   . Stomach cancer Neg Hx     ROS Per hpi  OBJECTIVE:  Today's Vitals   10/26/18 0840  BP: 114/67  Pulse: 75  Temp: 97.7 F (36.5 C)  TempSrc: Oral  SpO2: 94%  Weight: 209 lb 3.2 oz (94.9 kg)  Height: 5\' 3"  (1.6 m)   Body mass index is 37.06 kg/m.   Physical Exam Vitals signs and nursing note reviewed.  Constitutional:      Appearance: She is well-developed.  HENT:     Head: Normocephalic and atraumatic.     Mouth/Throat:     Pharynx: No oropharyngeal exudate.  Eyes:     General: No scleral icterus.    Conjunctiva/sclera: Conjunctivae normal.     Pupils: Pupils are equal, round, and reactive to light.  Neck:     Musculoskeletal: Neck supple.     Thyroid: No thyroid mass, thyromegaly  or thyroid tenderness.  Cardiovascular:     Rate and Rhythm: Normal rate and regular rhythm.     Heart sounds: Normal heart sounds. No murmur. No friction rub. No gallop.   Pulmonary:     Effort: Pulmonary effort is normal.     Breath sounds: Normal breath sounds. No wheezing or rales.  Musculoskeletal:     Right lower leg: No edema.     Left lower leg: No edema.  Lymphadenopathy:     Cervical: No cervical adenopathy.  Skin:    General: Skin is warm and dry.  Neurological:     Mental Status: She is alert and oriented to person, place, and time.     ASSESSMENT and PLAN  1. Other specified hypothyroidism Checking labs today, medications will be adjusted as needed.   - TSH  2. Mixed hyperlipidemia Checking labs today, medications will be adjusted as needed.  - Lipid panel  3. Prediabetes Checking labs today, medications will be adjusted as needed. discussed LFM - Hemoglobin A1c - Comprehensive metabolic panel  4. Major depression, recurrent, chronic (HCC) Not well controlled. Adding wellbutrin, reviewed r/se/b. Consider referral back to counseling and psychiatry.   5. History of community acquired pneumonia Resolved. Back to baseline health - CBC  6. Generalized anxiety disorder Not well controlled. Will address depression first with wellbutrin. Consider increasing buspar. [facility]  Other orders - QUEtiapine (SEROQUEL) 50 MG tablet; Take 1 tablet (50 mg total) by mouth at bedtime as needed. - QUEtiapine (SEROQUEL) 300 MG tablet; Take 1 tablet (300 mg total) by mouth at bedtime. - buPROPion (WELLBUTRIN XL) 150 MG 24 hr tablet; Take 1 tablet (  150 mg total) by mouth daily.  Return in about 4 weeks (around 11/23/2018).    Myles LippsIrma M Santiago, MD [facility] [address] [place], [place] [address] Ph.  [phone] Fax [phone]

## 2018-10-27 LAB — LIPID PANEL
Chol/HDL Ratio: 5.4 ratio — ABNORMAL HIGH (ref 0.0–4.4)
Cholesterol, Total: 275 mg/dL — ABNORMAL HIGH (ref 100–199)
HDL: 51 mg/dL (ref >39)
LDL Calculated: 151 mg/dL — ABNORMAL HIGH (ref 0–99)
Triglycerides: 364 mg/dL — ABNORMAL HIGH (ref 0–149)
VLDL Cholesterol Cal: 73 mg/dL — ABNORMAL HIGH (ref 5–40)

## 2018-10-27 LAB — CBC
Hematocrit: 37.7 % (ref 34.0–46.6)
Hemoglobin: 12.1 g/dL (ref 11.1–15.9)
MCH: 26.7 pg (ref 26.6–33.0)
MCHC: 32.1 g/dL (ref 31.5–35.7)
MCV: 83 fL (ref 79–97)
Platelets: 270 10*3/uL (ref 150–450)
RBC: 4.54 x10E6/uL (ref 3.77–5.28)
RDW: 14.6 % (ref 11.7–15.4)
WBC: 11.3 10*3/uL — ABNORMAL HIGH (ref 3.4–10.8)

## 2018-10-27 LAB — COMPREHENSIVE METABOLIC PANEL
ALT: 12 IU/L (ref 0–32)
AST: 17 IU/L (ref 0–40)
Albumin/Globulin Ratio: 1.7 (ref 1.2–2.2)
Albumin: 4.3 g/dL (ref 3.8–4.8)
Alkaline Phosphatase: 79 IU/L (ref 39–117)
BUN/Creatinine Ratio: 13 (ref 12–28)
BUN: 18 mg/dL (ref 8–27)
Bilirubin Total: 0.4 mg/dL (ref 0.0–1.2)
CO2: 25 mmol/L (ref 20–29)
Calcium: 9.4 mg/dL (ref 8.7–10.3)
Chloride: 98 mmol/L (ref 96–106)
Creatinine, Ser: 1.39 mg/dL — ABNORMAL HIGH (ref 0.57–1.00)
GFR calc Af Amer: 46 mL/min/{1.73_m2} — ABNORMAL LOW (ref >59)
GFR calc non Af Amer: 40 mL/min/{1.73_m2} — ABNORMAL LOW (ref >59)
Globulin, Total: 2.6 g/dL (ref 1.5–4.5)
Glucose: 118 mg/dL — ABNORMAL HIGH (ref 65–99)
Potassium: 4.3 mmol/L (ref 3.5–5.2)
Sodium: 137 mmol/L (ref 134–144)
Total Protein: 6.9 g/dL (ref 6.0–8.5)

## 2018-10-27 LAB — HEMOGLOBIN A1C
Est. average glucose Bld gHb Est-mCnc: 117 mg/dL
Hgb A1c MFr Bld: 5.7 % — ABNORMAL HIGH (ref 4.8–5.6)

## 2018-10-27 LAB — TSH: TSH: 4.54 u[IU]/mL — ABNORMAL HIGH (ref 0.450–4.500)

## 2018-10-31 MED ORDER — LEVOTHYROXINE SODIUM 75 MCG PO TABS: 75.0000 ug | ORAL_TABLET | Freq: Every day | ORAL | 0 refills | Status: DC

## 2018-10-31 NOTE — Addendum Note (Signed)
Addended by: Rutherford Guys on: 10/31/2018 11:09 PM   Modules accepted: Orders

## 2018-11-16 DIAGNOSIS — Z9889 Other specified postprocedural states: Secondary | ICD-10-CM | POA: Diagnosis not present

## 2018-11-18 ENCOUNTER — Other Ambulatory Visit: Payer: Self-pay | Admitting: Family Medicine

## 2018-11-18 NOTE — Telephone Encounter (Signed)
Pt changing pharmacy Requested Prescriptions  Pending Prescriptions Disp Refills  . buPROPion (WELLBUTRIN XL) 150 MG 24 hr tablet [Pharmacy Med Name: BUPROPION HCL XL 150 MG TABLET] 90 tablet 1    Sig: TAKE 1 TABLET BY MOUTH EVERY DAY     Psychiatry: Antidepressants - bupropion Passed - 11/18/2018  1:33 PM      Passed - Last BP in normal range    BP Readings from Last 1 Encounters:  10/26/18 114/67         Passed - Valid encounter within last 6 months    Recent Outpatient Visits          3 weeks ago Other specified hypothyroidism   [facility], Lilia Argue, MD   2 months ago Injury of head, initial encounter   Primary Care at [facility], Rex Kras, MD   6 months ago Viral illness   [facility], Lilia Argue, MD   7 months ago Stage 2 chronic kidney disease   [facility], Laurey Arrow, MD   11 months ago Vertigo   [facility] at [place], [name]      Future Appointments            In 2 days Rutherford Guys, MD Primary Care at [place], [place] - Completed PHQ-2 or PHQ-9 in the last 360 days.

## 2018-11-20 ENCOUNTER — Encounter: Payer: Self-pay | Admitting: Family Medicine

## 2018-11-20 ENCOUNTER — Ambulatory Visit (INDEPENDENT_AMBULATORY_CARE_PROVIDER_SITE_OTHER): Payer: Medicare HMO | Admitting: Family Medicine

## 2018-11-20 ENCOUNTER — Other Ambulatory Visit: Payer: Self-pay

## 2018-11-20 VITALS — BP 100/66 | HR 90 | Temp 98.3°F | Ht 63.0 in | Wt 212.0 lb

## 2018-11-20 DIAGNOSIS — F411 Generalized anxiety disorder: Secondary | ICD-10-CM | POA: Diagnosis not present

## 2018-11-20 DIAGNOSIS — E039 Hypothyroidism, unspecified: Secondary | ICD-10-CM

## 2018-11-20 DIAGNOSIS — F331 Major depressive disorder, recurrent, moderate: Secondary | ICD-10-CM

## 2018-11-20 MED ORDER — ONDANSETRON 8 MG PO TBDP: 4.0000 mg | ORAL_TABLET | Freq: Three times a day (TID) | ORAL | 5 refills | Status: DC | PRN

## 2018-11-20 NOTE — Patient Instructions (Addendum)
  Please come around 10th of August for TSH check, lab visit only   If you have lab work done today you will be contacted with your lab results within the next 2 weeks.  If you have not heard from [place] then please contact us. The fastest way to get your results is to register for My Chart.   IF you received an x-ray today, you will receive an invoice from [facility]. Please contact [facility] at [phone] with questions or concerns regarding your invoice.   IF you received labwork today, you will receive an invoice from [place]. Please contact [facility] at [phone] with questions or concerns regarding your invoice.   Our billing staff will not be able to assist you with questions regarding bills from these companies.  You will be contacted with the lab results as soon as they are available. The fastest way to get your results is to activate your My Chart account. Instructions are located on the last page of this paperwork. If you have not heard from [place] regarding the results in 2 weeks, please contact this office.

## 2018-11-20 NOTE — Progress Notes (Signed)
7/6/20209:40 AM  Jennifer Davidson 02/19/1956, 63 y.o., female [id number]  Chief Complaint  Patient presents with   Hypothyroidism    incresed fthyroid medication   Pain    in the left shoulder due to post surgery   Medication Refill    zofran    HPI:   Patient is a 62 y.o. female with past medical history significant for hypothyroidism, anxiety, depression, HLP, prediabetes,OSA on cpap who presents today forfollowup  Last OV June 2020 Started wellbutrin, increased levothyroxine  Reports that wellbutrin at first was making her too sleepy but now tolerating well and mood much better, feeling better about herself She also reports that her anxiety is better  Also requesting refill of zofran, takes very prn for occasional nausea   Depression screen [facility] 2/9 11/20/2018 11/20/2018 10/26/2018  Decreased Interest 1 0 1  Down, Depressed, Hopeless 1 0 1  PHQ - 2 Score 2 0 2  Altered sleeping 2 - 2  Tired, decreased energy 2 - 2  Change in appetite 1 - 2  Feeling bad or failure about yourself  1 - 1  Trouble concentrating 1 - 2  Moving slowly or fidgety/restless 1 - 2  Suicidal thoughts - - 0  PHQ-9 Score 10 - 13  Difficult doing work/chores Not difficult at all - Somewhat difficult  Some recent data might be hidden   GAD 7 : Generalized Anxiety Score 11/20/2018 10/26/2018 12/22/2017  Nervous, Anxious, on Edge 1 2 3   Control/stop worrying 2 2 3   Worry too much - different things 2 2 3   Trouble relaxing 1 2 1   Restless 1 2 3   Easily annoyed or irritable 2 1 2   Afraid - awful might happen 1 2 3   Total GAD 7 Score 10 13 18   Anxiety Difficulty Somewhat difficult Somewhat difficult Not difficult at all     Fall Risk  11/20/2018 10/26/2018 09/22/2018 09/19/2018 09/07/2018  Falls in the past year? 0 1 0 1 1  Comment - - - 4 weeks ago -  Number falls in past yr: 0 1 0 0 1  Injury with Fall? 0 1 0 1 1  Comment - - - - -  Risk Factor Category  - - - - -  Risk for fall due to : - - - -  History of fall(s);Impaired balance/gait  Risk for fall due to: Comment - - - - -  Follow up - - - Falls evaluation completed Falls evaluation completed     Allergies  Allergen Reactions   Tramadol Nausea And Vomiting   Trazodone And Nefazodone Other (See Comments)    Per pt trazodone caused hallucinations and behavior changes     Prior to Admission medications   Medication Sig Start Date End Date Taking? Authorizing Provider  buPROPion (WELLBUTRIN XL) 150 MG 24 hr tablet TAKE 1 TABLET BY MOUTH EVERY DAY 11/18/18  Yes Rutherford Guys, MD  busPIRone (BUSPAR) 5 MG tablet Take 1 tablet (5 mg total) by mouth 3 (three) times daily. May take 4th dose in evening for anxiety exacerbation 08/23/18  Yes Rutherford Guys, MD  diclofenac sodium (VOLTAREN) 1 % GEL Apply 2 g topically daily as needed (for knee pain). 08/23/18  Yes Rutherford Guys, MD  DULoxetine (CYMBALTA) 60 MG capsule Take 1 capsule (60 mg total) by mouth daily. 08/23/18  Yes Rutherford Guys, MD  esomeprazole (NEXIUM) 40 MG capsule TAKE 1 CAPSULE EVERY DAY 08/23/18  Yes Rutherford Guys, MD  fluticasone (FLONASE) 50 MCG/ACT nasal spray PLACE 2 SPRAYS AT BEDTIME INTO BOTH NOSTRILS. Patient taking differently: Place 1 spray into both nostrils 2 (two) times a day.  08/23/18  Yes Myles LippsSantiago, Tabb Croghan M, MD  gabapentin (NEURONTIN) 600 MG tablet Take 1 tablet (600 mg total) by mouth 3 (three) times daily. 08/23/18  Yes Myles LippsSantiago, Chalisa Kobler M, MD  levothyroxine (SYNTHROID) 75 MCG tablet Take 1 tablet (75 mcg total) by mouth daily before breakfast. 10/31/18  Yes Myles LippsSantiago, Katalin Colledge M, MD  meclizine (ANTIVERT) 25 MG tablet TAKE 1 TABLET BY MOUTH 3 TIMES DAILY AS NEEDED FOR DIZZINESS 08/23/18  Yes Myles LippsSantiago, Kalani Sthilaire M, MD  Melatonin 10 MG TABS Take 1 tablet by mouth daily.   Yes [provider]  ondansetron (ZOFRAN-ODT) 8 MG disintegrating tablet Take 0.5-1 tablets (4-8 mg total) by mouth every 8 (eight) hours as needed for nausea (from migraines). 03/29/18  Yes Sherren MochaShaw,  Eva N, MD  prazosin (MINIPRESS) 2 MG capsule TAKE 2 TABLETS BY MOUTH ALONG WITH THE 5MG  TABLET TO MAKE A TOTAL OF 9MG  DOSE AT BEDTIME 06/22/18  Yes Myles LippsSantiago, Javiana Anwar M, MD  prazosin (MINIPRESS) 5 MG capsule Take 2 capsules (10 mg total) by mouth at bedtime. TAKE 2 TO 3 CAPSULES AT BEDTIME 03/29/18  Yes Sherren MochaShaw, Eva N, MD  QUEtiapine (SEROQUEL) 300 MG tablet Take 1 tablet (300 mg total) by mouth at bedtime. 10/26/18  Yes Myles LippsSantiago, Aryahi Denzler M, MD  QUEtiapine (SEROQUEL) 50 MG tablet Take 1 tablet (50 mg total) by mouth at bedtime as needed. 10/26/18  Yes Myles LippsSantiago, Laqueshia Cihlar M, MD  rizatriptan (MAXALT-MLT) 10 MG disintegrating tablet Take 1 tablet (10 mg total) by mouth as needed for migraine. May repeat in 2 hours if needed 10/14/17  Yes Sherren MochaShaw, Eva N, MD    Past Medical History:  Diagnosis Date   Acute encephalopathy 06/25/2016   Anxiety    Cataract 08/31/2017   left eye   Cholesterol serum increased    Chronic back pain    chronic Rt low back pain. s/p L4-5 fusion. failed Rt facet injections. poss due to Rt SI joint dysfunction.   Chronic kidney disease    Depression    Diastolic heart failure (HCC)    GERD (gastroesophageal reflux disease)    Hyperparathyroidism (HCC)    Hypertension    Hypothyroidism    Migraine    Neuromuscular disorder (HCC)    Sleep apnea    Syncope 08/30/2015   Tachycardia    Trochanteric bursitis of both hips 2012   Confirmed on MRI    Past Surgical History:  Procedure Laterality Date   ABDOMINAL HYSTERECTOMY     APPENDECTOMY  1986   BACK SURGERY     L4-5 fusion   CATARACT EXTRACTION     KNEE SURGERY     SHOULDER ARTHROSCOPY WITH SUBACROMIAL DECOMPRESSION Left 10/20/2018   Procedure: SHOULDER ARTHROSCOPY, ROTATOR CUFF DEBRIDEMENT, GLENOHUMERAL JOINT DEBRIDEMENT, ACROMIOPLASTY, DISTAL CLAVICLE RESECTION;  Surgeon: Jodi GeraldsGraves, John, MD;  Location: [facility];  Service: Orthopedics;  Laterality: Left;    Social History   Tobacco Use   Smoking status: Never  Smoker   Smokeless tobacco: Never Used  Substance Use Topics   Alcohol use: No    Family History  Problem Relation Age of Onset   Diabetes Mother    Heart disease Mother    Kidney disease Mother    Thyroid disease Mother    Heart disease Father    Seizures Father    COPD Father  Irritable bowel syndrome Father    Cancer Maternal Grandmother    Diabetes Sister    Diabetes Brother    Colon cancer Neg Hx    Stomach cancer Neg Hx     ROS Per hpi  OBJECTIVE:  Today's Vitals   11/20/18 0924  BP: 100/66  Pulse: 90  Temp: 98.3 F (36.8 C)  TempSrc: Oral  SpO2: 95%  Weight: 212 lb (96.2 kg)  Height: 5\' 3"  (1.6 m)   Body mass index is 37.55 kg/m.   Physical Exam Vitals signs and nursing note reviewed.  Constitutional:      Appearance: She is well-developed.  HENT:     Head: Normocephalic and atraumatic.  Eyes:     General: No scleral icterus.    Conjunctiva/sclera: Conjunctivae normal.     Pupils: Pupils are equal, round, and reactive to light.  Neck:     Musculoskeletal: Neck supple.  Pulmonary:     Effort: Pulmonary effort is normal.  Skin:    General: Skin is warm and dry.  Neurological:     Mental Status: She is alert and oriented to person, place, and time.     ASSESSMENT and PLAN  1. Moderate episode of recurrent major depressive disorder (HCC) 2. Generalized anxiety disorder Improved. Patient would like to leave medications as they are. Continue with supportive measures  3. Acquired hypothyroidism Recheck TSH in about 6 weeks, lab ordered  Other orders - ondansetron (ZOFRAN-ODT) 8 MG disintegrating tablet; Take 0.5-1 tablets (4-8 mg total) by mouth every 8 (eight) hours as needed for nausea (from migraines).  Return in about 3 months (around 02/20/2019).    Myles LippsIrma M Santiago, MD [facility] at [facility] [address] [place], [place] [address] Ph.  [phone] Fax [phone]

## 2019-01-25 ENCOUNTER — Other Ambulatory Visit: Payer: Self-pay | Admitting: Family Medicine

## 2019-02-19 ENCOUNTER — Encounter: Payer: Self-pay | Admitting: Family Medicine

## 2019-02-19 ENCOUNTER — Other Ambulatory Visit: Payer: Self-pay

## 2019-02-19 ENCOUNTER — Ambulatory Visit (INDEPENDENT_AMBULATORY_CARE_PROVIDER_SITE_OTHER): Payer: Medicare Other | Admitting: Family Medicine

## 2019-02-19 VITALS — BP 116/80 | HR 67 | Temp 98.6°F | Ht 63.0 in | Wt 211.6 lb

## 2019-02-19 DIAGNOSIS — R7303 Prediabetes: Secondary | ICD-10-CM | POA: Diagnosis not present

## 2019-02-19 DIAGNOSIS — Z780 Asymptomatic menopausal state: Secondary | ICD-10-CM | POA: Diagnosis not present

## 2019-02-19 DIAGNOSIS — E782 Mixed hyperlipidemia: Secondary | ICD-10-CM | POA: Diagnosis not present

## 2019-02-19 DIAGNOSIS — Z23 Encounter for immunization: Secondary | ICD-10-CM | POA: Diagnosis not present

## 2019-02-19 DIAGNOSIS — E039 Hypothyroidism, unspecified: Secondary | ICD-10-CM

## 2019-02-19 MED ORDER — LEVOTHYROXINE SODIUM 75 MCG PO TABS: 75.0000 ug | ORAL_TABLET | Freq: Every day | ORAL | 0 refills | Status: DC

## 2019-02-19 MED ORDER — ATORVASTATIN CALCIUM 20 MG PO TABS: 20.0000 mg | ORAL_TABLET | Freq: Every day | ORAL | 3 refills | Status: DC

## 2019-02-19 NOTE — Progress Notes (Signed)
10/5/20209:17 AM  Jennifer Davidson [birth year], 63 y.o., female [id number]  Chief Complaint  Patient presents with  . Hypothyroidism  . Immunizations    requesting prevnar 13    HPI:   Patient is a 63 y.o. female with past medical history significant for hypothyroidism, anxiety, depression, HLP, prediabetes,OSA on cpapwho presents today forfollowup  Last OV July 2020 - increased levo from 70mcg to 54mcg Has been taking new medication wo any issues, denies any missed doses  Lab Results  Component Value Date   TSH 4.540 (H) 10/26/2018   Lab Results  Component Value Date   CHOL 275 (H) 10/26/2018   HDL 51 10/26/2018   LDLCALC 151 (H) 10/26/2018   TRIG 364 (H) 10/26/2018   CHOLHDL 5.4 (H) 10/26/2018   The 10-year ASCVD risk score Mikey Bussing DC Jr., et al., 2013) is: 7.5%  Lab Results  Component Value Date   HGBA1C 5.7 (H) 10/26/2018    Depression screen PHQ 2/9 02/19/2019 11/20/2018 11/20/2018  Decreased Interest 0 1 0  Down, Depressed, Hopeless 0 1 0  PHQ - 2 Score 0 2 0  Altered sleeping - 2 -  Tired, decreased energy - 2 -  Change in appetite - 1 -  Feeling bad or failure about yourself  - 1 -  Trouble concentrating - 1 -  Moving slowly or fidgety/restless - 1 -  Suicidal thoughts - - -  PHQ-9 Score - 10 -  Difficult doing work/chores - Not difficult at all -  Some recent data might be hidden    Fall Risk  02/19/2019 11/20/2018 10/26/2018 09/22/2018 09/19/2018  Falls in the past year? 0 0 1 0 1  Comment - - - - 4 weeks ago  Number falls in past yr: 0 0 1 0 0  Injury with Fall? 0 0 1 0 1  Comment - - - - -  Risk Factor Category  - - - - -  Risk for fall due to : - - - - -  Risk for fall due to: Comment - - - - -  Follow up - - - - Falls evaluation completed     Allergies  Allergen Reactions  . Tramadol Nausea And Vomiting  . Trazodone And Nefazodone Other (See Comments)    Per pt trazodone caused hallucinations and behavior changes     Prior to Admission  medications   Medication Sig Start Date End Date Taking? Authorizing Provider  buPROPion (WELLBUTRIN XL) 150 MG 24 hr tablet TAKE 1 TABLET BY MOUTH EVERY DAY 11/18/18  Yes Rutherford Guys, MD  busPIRone (BUSPAR) 5 MG tablet Take 1 tablet (5 mg total) by mouth 3 (three) times daily. May take 4th dose in evening for anxiety exacerbation 08/23/18  Yes Rutherford Guys, MD  diclofenac sodium (VOLTAREN) 1 % GEL Apply 2 g topically daily as needed (for knee pain). 08/23/18  Yes Rutherford Guys, MD  DULoxetine (CYMBALTA) 60 MG capsule Take 1 capsule (60 mg total) by mouth daily. 08/23/18  Yes Rutherford Guys, MD  esomeprazole (NEXIUM) 40 MG capsule TAKE 1 CAPSULE EVERY DAY 08/23/18  Yes Rutherford Guys, MD  fluticasone (FLONASE) 50 MCG/ACT nasal spray PLACE 2 SPRAYS AT BEDTIME INTO BOTH NOSTRILS. Patient taking differently: Place 1 spray into both nostrils 2 (two) times a day.  08/23/18  Yes Rutherford Guys, MD  gabapentin (NEURONTIN) 600 MG tablet Take 1 tablet (600 mg total) by mouth 3 (three) times daily. 08/23/18  Yes  Myles LippsSantiago, Josaphine Shimamoto M, MD  levothyroxine (SYNTHROID) 75 MCG tablet TAKE 1 TABLET (75 MCG TOTAL) BY MOUTH DAILY BEFORE BREAKFAST 01/25/19  Yes Myles LippsSantiago, Marteze Vecchio M, MD  meclizine (ANTIVERT) 25 MG tablet TAKE 1 TABLET BY MOUTH 3 TIMES DAILY AS NEEDED FOR DIZZINESS 08/23/18  Yes Myles LippsSantiago, Tavon Magnussen M, MD  Melatonin 10 MG TABS Take 1 tablet by mouth daily.   Yes [provider]  ondansetron (ZOFRAN-ODT) 8 MG disintegrating tablet Take 0.5-1 tablets (4-8 mg total) by mouth every 8 (eight) hours as needed for nausea (from migraines). 11/20/18  Yes Myles LippsSantiago, Astryd Pearcy M, MD  prazosin (MINIPRESS) 2 MG capsule TAKE 2 TABLETS BY MOUTH ALONG WITH THE 5MG  TABLET TO MAKE A TOTAL OF 9MG  DOSE AT BEDTIME 06/22/18  Yes Myles LippsSantiago, Lodie Waheed M, MD  prazosin (MINIPRESS) 5 MG capsule Take 2 capsules (10 mg total) by mouth at bedtime. TAKE 2 TO 3 CAPSULES AT BEDTIME 03/29/18  Yes Sherren MochaShaw, Eva N, MD  QUEtiapine (SEROQUEL) 300 MG tablet Take 1  tablet (300 mg total) by mouth at bedtime. 10/26/18  Yes Myles LippsSantiago, Gionni Vaca M, MD  QUEtiapine (SEROQUEL) 50 MG tablet Take 1 tablet (50 mg total) by mouth at bedtime as needed. 10/26/18  Yes Myles LippsSantiago, Kamir Selover M, MD  rizatriptan (MAXALT-MLT) 10 MG disintegrating tablet Take 1 tablet (10 mg total) by mouth as needed for migraine. May repeat in 2 hours if needed 10/14/17  Yes Sherren MochaShaw, Eva N, MD  famotidine (PEPCID) 40 MG tablet  01/10/19   [provider]    Past Medical History:  Diagnosis Date  . Acute encephalopathy 06/25/2016  . Anxiety   . Cataract 08/31/2017   left eye  . Cholesterol serum increased   . Chronic back pain    chronic Rt low back pain. s/p L4-5 fusion. failed Rt facet injections. poss due to Rt SI joint dysfunction.  . Chronic kidney disease   . Depression   . Diastolic heart failure (HCC)   . GERD (gastroesophageal reflux disease)   . Hyperparathyroidism (HCC)   . Hypertension   . Hypothyroidism   . Migraine   . Neuromuscular disorder (HCC)   . Sleep apnea   . Syncope 08/30/2015  . Tachycardia   . Trochanteric bursitis of both hips 2012   Confirmed on MRI    Past Surgical History:  Procedure Laterality Date  . ABDOMINAL HYSTERECTOMY    . APPENDECTOMY  1986  . BACK SURGERY     L4-5 fusion  . CATARACT EXTRACTION    . KNEE SURGERY    . SHOULDER ARTHROSCOPY WITH SUBACROMIAL DECOMPRESSION Left 10/20/2018   Procedure: SHOULDER ARTHROSCOPY, ROTATOR CUFF DEBRIDEMENT, GLENOHUMERAL JOINT DEBRIDEMENT, ACROMIOPLASTY, DISTAL CLAVICLE RESECTION;  Surgeon: Jodi GeraldsGraves, John, MD;  Location: [facility];  Service: Orthopedics;  Laterality: Left;    Social History   Tobacco Use  . Smoking status: Never Smoker  . Smokeless tobacco: Never Used  Substance Use Topics  . Alcohol use: No    Family History  Problem Relation Age of Onset  . Diabetes Mother   . Heart disease Mother   . Kidney disease Mother   . Thyroid disease Mother   . Heart disease Father   . Seizures Father   .  COPD Father   . Irritable bowel syndrome Father   . Cancer Maternal Grandmother   . Diabetes Sister   . Diabetes Brother   . Colon cancer Neg Hx   . Stomach cancer Neg Hx     Review of Systems  Constitutional: Negative  for chills and fever.  Respiratory: Negative for cough and shortness of breath.   Cardiovascular: Negative for chest pain, palpitations and leg swelling.  Gastrointestinal: Negative for abdominal pain, nausea and vomiting.     OBJECTIVE:  Today's Vitals   02/19/19 0904  BP: 116/80  Pulse: 67  Temp: 98.6 F (37 C)  SpO2: 96%  Weight: 211 lb 9.6 oz (96 kg)  Height: 5\' 3"  (1.6 m)   Body mass index is 37.48 kg/m.   Physical Exam Vitals signs and nursing note reviewed.  Constitutional:      Appearance: She is well-developed.  HENT:     Head: Normocephalic and atraumatic.     Mouth/Throat:     Pharynx: No oropharyngeal exudate.  Eyes:     General: No scleral icterus.    Conjunctiva/sclera: Conjunctivae normal.     Pupils: Pupils are equal, round, and reactive to light.  Neck:     Musculoskeletal: Neck supple.     Thyroid: No thyroid mass, thyromegaly or thyroid tenderness.  Cardiovascular:     Rate and Rhythm: Normal rate and regular rhythm.     Heart sounds: Normal heart sounds. No murmur. No friction rub. No gallop.   Pulmonary:     Effort: Pulmonary effort is normal.     Breath sounds: Normal breath sounds. No wheezing, rhonchi or rales.  Lymphadenopathy:     Cervical: No cervical adenopathy.  Skin:    General: Skin is warm and dry.  Neurological:     Mental Status: She is alert and oriented to person, place, and time.  Psychiatric:        Mood and Affect: Mood normal.     No results found for this or any previous visit (from the past 24 hour(s)).  No results found.   ASSESSMENT and PLAN  1. Acquired hypothyroidism Checking labs today, medications will be adjusted as needed.  - TSH - TSH; Future  2. Mixed hyperlipidemia  Discussed recent labs, ascvd risk. Discussed LFM and starting atorvastatin. New med r/se/b reviewed. Patient educational handout given. - Comprehensive metabolic panel; Future - Lipid panel; Future  3. Prediabetes Stable. Cont with LFM - Hemoglobin A1c; Future  4. Postmenopausal estrogen deficiency - DG Bone Density; Future  5. Need for vaccination - Pneumococcal conjugate vaccine 13-valent IM  Other orders - levothyroxine (SYNTHROID) 75 MCG tablet; Take 1 tablet (75 mcg total) by mouth daily before breakfast. - atorvastatin (LIPITOR) 20 MG tablet; Take 1 tablet (20 mg total) by mouth daily.  Return in about 3 months (around 05/22/2019) for with fasting labs 3-4 days prior, HLP, prediabetes, hypothyroidism.    07/20/2019, MD Primary Care at [facility] [address] [place], [place] Ph.  [phone] Fax [phone]

## 2019-02-19 NOTE — Patient Instructions (Addendum)
Fasting labs 3-4 days prior to our next appt   If you have lab work done today you will be contacted with your lab results within the next 2 weeks.  If you have not heard from [place] then please contact us. The fastest way to get your results is to register for My Chart.   IF you received an x-ray today, you will receive an invoice from [facility]. Please contact [facility] at [phone] with questions or concerns regarding your invoice.   IF you received labwork today, you will receive an invoice from [facility]. Please contact [facility] at [phone] with questions or concerns regarding your invoice.   Our billing staff will not be able to assist you with questions regarding bills from these companies.  You will be contacted with the lab results as soon as they are available. The fastest way to get your results is to activate your My Chart account. Instructions are located on the last page of this paperwork. If you have not heard from [place] regarding the results in 2 weeks, please contact this office.     Fat and Cholesterol Restricted Eating Plan Eating a diet that limits fat and cholesterol may help lower your risk for heart disease and other conditions. Your body needs fat and cholesterol for basic functions, but eating too much of these things can be harmful to your health. Your health care provider may order lab tests to check your blood fat (lipid) and cholesterol levels. This helps your health care provider understand your risk for certain conditions and whether you need to make diet changes. Work with your health care provider or dietitian to make an eating plan that is right for you. Your plan includes:  Limit your fat intake to ______% or less of your total calories a day.  Limit your saturated fat intake to ______% or less of your total calories a day.  Limit the amount of cholesterol in your diet to less than _________mg a day.  Eat ___________ g of fiber a  day. What are tips for following this plan? General guidelines   If you are overweight, work with your health care provider to lose weight safely. Losing just 5-10% of your body weight can improve your overall health and help prevent diseases such as diabetes and heart disease.  Avoid: ? Foods with added sugar. ? Fried foods. ? Foods that contain partially hydrogenated oils, including stick margarine, some tub margarines, cookies, crackers, and other baked goods.  Limit alcohol intake to no more than 1 drink a day for nonpregnant women and 2 drinks a day for men. One drink equals 12 oz of beer, 5 oz of wine, or 1 oz of hard liquor. Reading food labels  Check food labels for: ? Trans fats, partially hydrogenated oils, or high amounts of saturated fat. Avoid foods that contain saturated fat and trans fat. ? The amount of cholesterol in each serving. Try to eat no more than 200 mg of cholesterol each day. ? The amount of fiber in each serving. Try to eat at least 20-30 g of fiber each day.  Choose foods with healthy fats, such as: ? Monounsaturated and polyunsaturated fats. These include olive and canola oil, flaxseeds, walnuts, almonds, and seeds. ? Omega-3 fats. These are found in foods such as salmon, mackerel, sardines, tuna, flaxseed oil, and ground flaxseeds.  Choose grain products that have whole grains. Look for the word "whole" as the first word in the ingredient list. Cooking  Lacinda Axon  foods using methods other than frying. Baking, boiling, grilling, and broiling are some healthy options.  Eat more home-cooked food and less restaurant, buffet, and fast food.  Avoid cooking using saturated fats. ? Animal sources of saturated fats include meats, butter, and cream. ? Plant sources of saturated fats include palm oil, palm kernel oil, and coconut oil. Meal planning   At meals, imagine dividing your plate into fourths: ? Fill one-half of your plate with vegetables and green  salads. ? Fill one-fourth of your plate with whole grains. ? Fill one-fourth of your plate with lean protein foods.  Eat fish that is high in omega-3 fats at least two times a week.  Eat more foods that contain fiber, such as whole grains, beans, apples, broccoli, carrots, peas, and barley. These foods help promote healthy cholesterol levels in the blood. Recommended foods Grains  Whole grains, such as whole wheat or whole grain breads, crackers, cereals, and pasta. Unsweetened oatmeal, bulgur, barley, quinoa, or brown rice. Corn or whole wheat flour tortillas. Vegetables  Fresh or frozen vegetables (raw, steamed, roasted, or grilled). Green salads. Fruits  All fresh, canned (in natural juice), or frozen fruits. Meats and other protein foods  Ground beef (85% or leaner), grass-fed beef, or beef trimmed of fat. Skinless chicken or [place]. Ground chicken or [place]. Pork trimmed of fat. All fish and seafood. Egg whites. Dried beans, peas, or lentils. Unsalted nuts or seeds. Unsalted canned beans. Natural nut butters without added sugar and oil. Dairy  Low-fat or nonfat dairy products, such as skim or 1% milk, 2% or reduced-fat cheeses, low-fat and fat-free ricotta or cottage cheese, or plain low-fat and nonfat yogurt. Fats and oils  Tub margarine without trans fats. Light or reduced-fat mayonnaise and salad dressings. Avocado. Olive, canola, sesame, or safflower oils. The items listed above may not be a complete list of recommended foods or beverages. Contact your dietitian for more options. Foods to avoid Grains  White bread. White pasta. White rice. Cornbread. Bagels, pastries, and croissants. Crackers and snack foods that contain trans fat and hydrogenated oils. Vegetables  Vegetables cooked in cheese, cream, or butter sauce. Fried vegetables. Fruits  Canned fruit in heavy syrup. Fruit in cream or butter sauce. Fried fruit. Meats and other protein foods  Fatty cuts of meat.  Ribs, chicken wings, bacon, sausage, bologna, salami, chitterlings, fatback, hot dogs, bratwurst, and packaged lunch meats. Liver and organ meats. Whole eggs and egg yolks. Chicken and [place] with skin. Fried meat. Dairy  Whole or 2% milk, cream, half-and-half, and cream cheese. Whole milk cheeses. Whole-fat or sweetened yogurt. Full-fat cheeses. Nondairy creamers and whipped toppings. Processed cheese, cheese spreads, and cheese curds. Beverages  Alcohol. Sugar-sweetened drinks such as sodas, lemonade, and fruit drinks. Fats and oils  Butter, stick margarine, lard, shortening, ghee, or bacon fat. Coconut, palm kernel, and palm oils. Sweets and desserts  Corn syrup, sugars, honey, and molasses. Candy. Jam and jelly. Syrup. Sweetened cereals. Cookies, pies, cakes, donuts, muffins, and ice cream. The items listed above may not be a complete list of foods and beverages to avoid. Contact your dietitian for more information. Summary  Your body needs fat and cholesterol for basic functions. However, eating too much of these things can be harmful to your health.  Work with your health care provider and dietitian to follow a diet low in fat and cholesterol. Doing this may help lower your risk for heart disease and other conditions.  Choose healthy fats, such as monounsaturated  and polyunsaturated fats, and foods high in omega-3 fatty acids.  Eat fiber-rich foods, such as whole grains, beans, peas, fruits, and vegetables.  Limit or avoid alcohol, fried foods, and foods high in saturated fats, partially hydrogenated oils, and sugar. This information is not intended to replace advice given to you by your health care provider. Make sure you discuss any questions you have with your health care provider. Document Released: 05/03/2005 Document Revised: 04/15/2017 Document Reviewed: 01/18/2017 Elsevier Patient Education  2020 [employer]

## 2019-02-20 LAB — TSH: TSH: 2.12 u[IU]/mL (ref 0.450–4.500)

## 2019-03-30 ENCOUNTER — Other Ambulatory Visit: Payer: Self-pay | Admitting: Family Medicine

## 2019-04-02 ENCOUNTER — Other Ambulatory Visit: Payer: Self-pay | Admitting: Family Medicine

## 2019-04-02 DIAGNOSIS — Z1231 Encounter for screening mammogram for malignant neoplasm of breast: Secondary | ICD-10-CM

## 2019-04-10 ENCOUNTER — Other Ambulatory Visit: Payer: Self-pay | Admitting: Family Medicine

## 2019-04-11 NOTE — Telephone Encounter (Signed)
Requested medication (s) are due for refill today: yes  Requested medication (s) are on the active medication list: yes  Last refill:  08/25/2018  Future visit scheduled: yes  Notes to clinic: refill cannot be delegated    Requested Prescriptions  Pending Prescriptions Disp Refills   QUEtiapine (SEROQUEL) 50 MG tablet [Pharmacy Med Name: QUETIAPINE FUMARATE 50 MG Tablet] 90 tablet 0    Sig: TAKE 1 TABLET AT BEDTIME AS NEEDED     Not Delegated - Psychiatry:  Antipsychotics - Second Generation (Atypical) - quetiapine Failed - 04/10/2019  4:47 PM      Failed - This refill cannot be delegated      Passed - ALT in normal range and within 180 days    ALT  Date Value Ref Range Status  10/26/2018 12 0 - 32 IU/L Final         Passed - AST in normal range and within 180 days    AST  Date Value Ref Range Status  10/26/2018 17 0 - 40 IU/L Final         Passed - Last BP in normal range    BP Readings from Last 1 Encounters:  02/19/19 116/80         Passed - Valid encounter within last 6 months    Recent Outpatient Visits          1 month ago Acquired hypothyroidism   [facility], Lilia Argue, MD   4 months ago Moderate episode of recurrent major depressive disorder [facility])   [facility], Lilia Argue, MD   5 months ago Other specified hypothyroidism   [facility], Lilia Argue, MD   6 months ago Cough   [facility], Ranell Patrick, MD   6 months ago Cough   [facility], Ranell Patrick, MD      Future Appointments            In 1 month Rutherford Guys, MD Primary Care at [place], [place] - Completed PHQ-2 or PHQ-9 in the last 360 days.      Signed Prescriptions Disp Refills   hydrOXYzine (ATARAX/VISTARIL) 50 MG tablet 360 tablet 0    Sig: TAKE 1 TABLET EVERY 6 HOURS AS NEEDED     Ear, Nose, and Throat:  Antihistamines Passed - 04/10/2019  4:47 PM      Passed - Valid encounter within  last 12 months    Recent Outpatient Visits          1 month ago Acquired hypothyroidism   [facility], Lilia Argue, MD   4 months ago Moderate episode of recurrent major depressive disorder [facility])   [facility], Lilia Argue, MD   5 months ago Other specified hypothyroidism   [facility], Lilia Argue, MD   6 months ago Cough   [facility], Ranell Patrick, MD   6 months ago Cough   [facility], Ranell Patrick, MD      Future Appointments            In 1 month Rutherford Guys, MD Primary Care at [place], [facility]            DULoxetine (CYMBALTA) 60 MG capsule 90 capsule 1    Sig: TAKE 1 CAPSULE EVERY DAY  Psychiatry: Antidepressants - SNRI Passed - 04/10/2019  4:47 PM      Passed - Last BP in normal range    BP Readings from Last 1 Encounters:  02/19/19 116/80         Passed - Valid encounter within last 6 months    Recent Outpatient Visits          1 month ago Acquired hypothyroidism   [facility], Meda Coffee, MD   4 months ago Moderate episode of recurrent major depressive disorder [facility])   [facility], Meda Coffee, MD   5 months ago Other specified hypothyroidism   [facility], Meda Coffee, MD   6 months ago Cough   [facility], Asencion Partridge, MD   6 months ago Cough   [facility], Asencion Partridge, MD      Future Appointments            In 1 month Myles Lipps, MD Primary Care at [place], [facility]           Passed - Completed PHQ-2 or PHQ-9 in the last 360 days.       esomeprazole (NEXIUM) 40 MG capsule 90 capsule 1    Sig: TAKE 1 CAPSULE EVERY DAY     Gastroenterology: Proton Pump Inhibitors Passed - 04/10/2019  4:47 PM      Passed - Valid encounter within last 12 months    Recent Outpatient Visits          1 month ago Acquired hypothyroidism   [facility], Meda Coffee, MD   4 months ago Moderate  episode of recurrent major depressive disorder [facility])   [facility] at [facility], Meda Coffee, MD   5 months ago Other specified hypothyroidism   [facility] at [facility], Meda Coffee, MD   6 months ago Cough   [facility], Asencion Partridge, MD   6 months ago Cough   [facility] at [facility], Asencion Partridge, MD      Future Appointments            In 1 month Myles Lipps, MD Primary Care at [place], [facility]

## 2019-04-11 NOTE — Telephone Encounter (Signed)
Pt requesting meds.

## 2019-04-19 ENCOUNTER — Other Ambulatory Visit: Payer: Self-pay | Admitting: Family Medicine

## 2019-04-19 NOTE — Telephone Encounter (Signed)
Medication Refill - Medication: rizatriptan (MAXALT-MLT) 10 MG disintegrating tablet [[id number]]    Preferred Pharmacy (with phone number or street name):  [place], [address]  [place] [address]  Phone: [phone] Fax: [phone]     Agent: Please be advised that RX refills may take up to 3 business days. We ask that you follow-up with your pharmacy.

## 2019-04-25 ENCOUNTER — Other Ambulatory Visit: Payer: Self-pay | Admitting: Family Medicine

## 2019-04-25 NOTE — Telephone Encounter (Signed)
Requested medication (s) are due for refill today:yes  Requested medication (s) are on the active medication list: yes  Last refill:  01/22/2019  Future visit scheduled: yes  Notes to clinic:  Refill cannot be delegated   Requested Prescriptions  Pending Prescriptions Disp Refills   QUEtiapine (SEROQUEL) 300 MG tablet [Pharmacy Med Name: QUETIAPINE FUMARATE 300 MG TAB] 90 tablet 1    Sig: TAKE 1 TABLET BY MOUTH EVERYDAY AT BEDTIME     Not Delegated - Psychiatry:  Antipsychotics - Second Generation (Atypical) - quetiapine Failed - 04/25/2019  2:31 PM      Failed - This refill cannot be delegated      Failed - ALT in normal range and within 180 days    ALT  Date Value Ref Range Status  10/26/2018 12 0 - 32 IU/L Final         Failed - AST in normal range and within 180 days    AST  Date Value Ref Range Status  10/26/2018 17 0 - 40 IU/L Final         Passed - Last BP in normal range    BP Readings from Last 1 Encounters:  02/19/19 116/80         Passed - Valid encounter within last 6 months    Recent Outpatient Visits          2 months ago Acquired hypothyroidism   [facility], Meda Coffee, MD   5 months ago Moderate episode of recurrent major depressive disorder [facility])   [facility] at [facility], Meda Coffee, MD   6 months ago Other specified hypothyroidism   [facility] at [facility], Meda Coffee, MD   7 months ago Cough   [facility], Asencion Partridge, MD   7 months ago Cough   [facility], Asencion Partridge, MD      Future Appointments            In 3 weeks Myles Lipps, MD Primary Care at [place], [facility]           Passed - Completed PHQ-2 or PHQ-9 in the last 360 days.       buPROPion (WELLBUTRIN XL) 150 MG 24 hr tablet [Pharmacy Med Name: BUPROPION HCL XL 150 MG TABLET] 90 tablet 1    Sig: TAKE 1 TABLET BY MOUTH EVERY DAY     Psychiatry: Antidepressants - bupropion Passed - 04/25/2019  2:31 PM      Passed - Last BP  in normal range    BP Readings from Last 1 Encounters:  02/19/19 116/80         Passed - Valid encounter within last 6 months    Recent Outpatient Visits          2 months ago Acquired hypothyroidism   [facility], Meda Coffee, MD   5 months ago Moderate episode of recurrent major depressive disorder [facility])   [facility] at [facility], Meda Coffee, MD   6 months ago Other specified hypothyroidism   [facility], Meda Coffee, MD   7 months ago Cough   [facility], Asencion Partridge, MD   7 months ago Cough   [facility] at [facility], Asencion Partridge, MD      Future Appointments            In 3 weeks Myles Lipps, MD Primary Care at  [place], [place] - Completed PHQ-2 or PHQ-9 in the last 360 days.

## 2019-04-26 NOTE — Telephone Encounter (Signed)
Pt requesting refill   prazosin (MINIPRESS) 2 MG capsule [[id number]]      Order Details Dose, Route, Frequency: As Directed  Dispense Quantity: 180 capsule Refills: 0       Sig: TAKE 2 TABLETS BY MOUTH ALONG WITH THE 5MG  TABLET TO MAKE A TOTAL OF 9MG  DOSE AT BEDTIME      Start Date: 06/22/18 End Date: --  Written Date: 06/22/18 Expiration Date: 06/22/19  Original Order:  prazosin (MINIPRESS) 2 MG capsule [[id number]]  Providers  Ordering and Authorizing Provider: Rutherford Guys, MD NPI: [id number]  DEA #: [id number]  Ordering User:  Tarry Kos, RN      Pharmacy  [id number] [place], [address] [facility]  [place], [place] [address]  Phone:  [phone] Fax:  [phone]  DEA #:  [id number]

## 2019-04-30 ENCOUNTER — Other Ambulatory Visit: Payer: Self-pay | Admitting: Family Medicine

## 2019-04-30 ENCOUNTER — Telehealth: Payer: Self-pay | Admitting: Family Medicine

## 2019-04-30 MED ORDER — PRAZOSIN HCL 2 MG PO CAPS: ORAL_CAPSULE | ORAL | 0 refills | Status: DC

## 2019-04-30 MED ORDER — PRAZOSIN HCL 5 MG PO CAPS: 10.0000 mg | ORAL_CAPSULE | Freq: Every day | ORAL | 0 refills | Status: DC

## 2019-04-30 NOTE — Telephone Encounter (Signed)
Medication: prazosin (MINIPRESS) 2 MG capsule [[id number]]   Has the patient contacted their pharmacy? Yes  (Agent: If no, request that the patient contact the pharmacy for the refill.) (Agent: If yes, when and what did the pharmacy advise?)  Preferred Pharmacy (with phone number or street name): [id number] [place], [place]  Phone:  [phone] Fax:  [phone]     Agent: Please be advised that RX refills may take up to 3 business days. We ask that you follow-up with your pharmacy.

## 2019-04-30 NOTE — Telephone Encounter (Signed)
Medication: prazosin (MINIPRESS) 5 MG capsule [[id number]] ,   Has the patient contacted their pharmacy? Yes  (Agent: If no, request that the patient contact the pharmacy for the refill.) (Agent: If yes, when and what did the pharmacy advise?)  Preferred Pharmacy (with phone number or street name): [place], [place]  Phone:  [phone] Fax:  [phone]     Agent: Please be advised that RX refills may take up to 3 business days. We ask that you follow-up with your pharmacy.

## 2019-05-15 ENCOUNTER — Ambulatory Visit: Payer: Medicare Other

## 2019-05-15 ENCOUNTER — Other Ambulatory Visit: Payer: Self-pay

## 2019-05-15 DIAGNOSIS — R7303 Prediabetes: Secondary | ICD-10-CM

## 2019-05-15 DIAGNOSIS — E039 Hypothyroidism, unspecified: Secondary | ICD-10-CM

## 2019-05-15 DIAGNOSIS — E782 Mixed hyperlipidemia: Secondary | ICD-10-CM

## 2019-05-16 ENCOUNTER — Ambulatory Visit (INDEPENDENT_AMBULATORY_CARE_PROVIDER_SITE_OTHER): Payer: Medicare Other | Admitting: Family Medicine

## 2019-05-16 VITALS — BP 116/80 | Ht 63.0 in | Wt 211.0 lb

## 2019-05-16 DIAGNOSIS — Z0000 Encounter for general adult medical examination without abnormal findings: Secondary | ICD-10-CM | POA: Diagnosis not present

## 2019-05-16 LAB — COMPREHENSIVE METABOLIC PANEL
ALT: 22 IU/L (ref 0–32)
AST: 18 IU/L (ref 0–40)
Albumin/Globulin Ratio: 1.4 (ref 1.2–2.2)
Albumin: 3.8 g/dL (ref 3.8–4.8)
Alkaline Phosphatase: 90 IU/L (ref 39–117)
BUN/Creatinine Ratio: 8 — ABNORMAL LOW (ref 12–28)
BUN: 13 mg/dL (ref 8–27)
Bilirubin Total: 0.4 mg/dL (ref 0.0–1.2)
CO2: 24 mmol/L (ref 20–29)
Calcium: 9.5 mg/dL (ref 8.7–10.3)
Chloride: 103 mmol/L (ref 96–106)
Creatinine, Ser: 1.59 mg/dL — ABNORMAL HIGH (ref 0.57–1.00)
GFR calc Af Amer: 39 mL/min/{1.73_m2} — ABNORMAL LOW (ref >59)
GFR calc non Af Amer: 34 mL/min/{1.73_m2} — ABNORMAL LOW (ref >59)
Globulin, Total: 2.7 g/dL (ref 1.5–4.5)
Glucose: 112 mg/dL — ABNORMAL HIGH (ref 65–99)
Potassium: 4.3 mmol/L (ref 3.5–5.2)
Sodium: 140 mmol/L (ref 134–144)
Total Protein: 6.5 g/dL (ref 6.0–8.5)

## 2019-05-16 LAB — HEMOGLOBIN A1C
Est. average glucose Bld gHb Est-mCnc: 126 mg/dL
Hgb A1c MFr Bld: 6 % — ABNORMAL HIGH (ref 4.8–5.6)

## 2019-05-16 LAB — LIPID PANEL
Chol/HDL Ratio: 2.7 ratio (ref 0.0–4.4)
Cholesterol, Total: 153 mg/dL (ref 100–199)
HDL: 56 mg/dL (ref >39)
LDL Chol Calc (NIH): 68 mg/dL (ref 0–99)
Triglycerides: 174 mg/dL — ABNORMAL HIGH (ref 0–149)
VLDL Cholesterol Cal: 29 mg/dL (ref 5–40)

## 2019-05-16 LAB — TSH: TSH: 0.96 u[IU]/mL (ref 0.450–4.500)

## 2019-05-16 NOTE — Patient Instructions (Addendum)
Thank you for taking time to come for your Medicare Wellness Visit. I appreciate your ongoing commitment to your health goals. Please review the following plan we discussed and let me know if I can assist you in the future.  Jennifer Kennedy LPN  reventive Care 67-65 Years Old, Female Preventive care refers to visits with your health care provider and lifestyle choices that can promote health and wellness. This includes:  A yearly physical exam. This may also be called an annual well check.  Regular dental visits and eye exams.  Immunizations.  Screening for certain conditions.  Healthy lifestyle choices, such as eating a healthy diet, getting regular exercise, not using drugs or products that contain nicotine and tobacco, and limiting alcohol use. What can I expect for my preventive care visit? Physical exam Your health care provider will check your:  Height and weight. This may be used to calculate body mass index (BMI), which tells if you are at a healthy weight.  Heart rate and blood pressure.  Skin for abnormal spots. Counseling Your health care provider may ask you questions about your:  Alcohol, tobacco, and drug use.  Emotional well-being.  Home and relationship well-being.  Sexual activity.  Eating habits.  Work and work Statistician.  Method of birth control.  Menstrual cycle.  Pregnancy history. What immunizations do I need?  Influenza (flu) vaccine  This is recommended every year. Tetanus, diphtheria, and pertussis (Tdap) vaccine  You may need a Td booster every 10 years. Varicella (chickenpox) vaccine  You may need this if you have not been vaccinated. Zoster (shingles) vaccine  You may need this after age 70. Measles, mumps, and rubella (MMR) vaccine  You may need at least one dose of MMR if you were born in 1957 or later. You may also need a second dose. Pneumococcal conjugate (PCV13) vaccine  You may need this if you have certain conditions  and were not previously vaccinated. Pneumococcal polysaccharide (PPSV23) vaccine  You may need one or two doses if you smoke cigarettes or if you have certain conditions. Meningococcal conjugate (MenACWY) vaccine  You may need this if you have certain conditions. Hepatitis A vaccine  You may need this if you have certain conditions or if you travel or work in places where you may be exposed to hepatitis A. Hepatitis B vaccine  You may need this if you have certain conditions or if you travel or work in places where you may be exposed to hepatitis B. Haemophilus influenzae type b (Hib) vaccine  You may need this if you have certain conditions. Human papillomavirus (HPV) vaccine  If recommended by your health care provider, you may need three doses over 6 months. You may receive vaccines as individual doses or as more than one vaccine together in one shot (combination vaccines). Talk with your health care provider about the risks and benefits of combination vaccines. What tests do I need? Blood tests  Lipid and cholesterol levels. These may be checked every 5 years, or more frequently if you are over 62 years old.  Hepatitis C test.  Hepatitis B test. Screening  Lung cancer screening. You may have this screening every year starting at age 38 if you have a 30-pack-year history of smoking and currently smoke or have quit within the past 15 years.  Colorectal cancer screening. All adults should have this screening starting at age 14 and continuing until age 38. Your health care provider may recommend screening at age 45 if you are  at increased risk. You will have tests every 1-10 years, depending on your results and the type of screening test.  Diabetes screening. This is done by checking your blood sugar (glucose) after you have not eaten for a while (fasting). You may have this done every 1-3 years.  Mammogram. This may be done every 1-2 years. Talk with your health care provider  about when you should start having regular mammograms. This may depend on whether you have a family history of breast cancer.  BRCA-related cancer screening. This may be done if you have a family history of breast, ovarian, tubal, or peritoneal cancers.  Pelvic exam and Pap test. This may be done every 3 years starting at age 63. Starting at age 63, this may be done every 5 years if you have a Pap test in combination with an HPV test. Other tests  Sexually transmitted disease (STD) testing.  Bone density scan. This is done to screen for osteoporosis. You may have this scan if you are at high risk for osteoporosis. Follow these instructions at home: Eating and drinking  Eat a diet that includes fresh fruits and vegetables, whole grains, lean protein, and low-fat dairy.  Take vitamin and mineral supplements as recommended by your health care provider.  Do not drink alcohol if: ? Your health care provider tells you not to drink. ? You are pregnant, may be pregnant, or are planning to become pregnant.  If you drink alcohol: ? Limit how much you have to 0-1 drink a day. ? Be aware of how much alcohol is in your drink. In the [place] one drink equals one 12 oz bottle of beer (355 mL), one 5 oz glass of wine (148 mL), or one 1 oz glass of hard liquor (44 mL). Lifestyle  Take daily care of your teeth and gums.  Stay active. Exercise for at least 30 minutes on 5 or more days each week.  Do not use any products that contain nicotine or tobacco, such as cigarettes, e-cigarettes, and chewing tobacco. If you need help quitting, ask your health care provider.  If you are sexually active, practice safe sex. Use a condom or other form of birth control (contraception) in order to prevent pregnancy and STIs (sexually transmitted infections).  If told by your health care provider, take low-dose aspirin daily starting at age 21. What's next?  Visit your health care provider once a year for a well  check visit.  Ask your health care provider how often you should have your eyes and teeth checked.  Stay up to date on all vaccines. This information is not intended to replace advice given to you by your health care provider. Make sure you discuss any questions you have with your health care provider. Document Released: 05/30/2015 Document Revised: 01/12/2018 Document Reviewed: 01/12/2018 Elsevier Patient Education  2020 [employer].

## 2019-05-16 NOTE — Progress Notes (Signed)
Presents today for [employer] Visit   Date of last exam:02/19/2019  Interpreter used for this visit? No  I connected with  CHANDRA ASHER on 05/16/19 by a telephone  and verified that I am speaking with the correct person using two identifiers.   I discussed the limitations of evaluation and management by telemedicine. The patient expressed understanding and agreed to proceed.   Patient Care Team: Myles Lipps, MD as PCP - General (Family Medicine) Estill Bamberg, MD as Consulting Physician (Orthopedic Surgery) Jethro Bolus, MD as Consulting Physician (Ophthalmology) Claria Dice, MD as Attending Physician (Physical Medicine and Rehabilitation)   Other items to address today:   Discussed Eye/Dental Discussed immunizations Follow up scheduled 1-08-17-2019 Dr. Neva Seat    Other Screening: Last screening for diabetes: 05/16/2019 Last lipid screening:  05/16/2019  ADVANCE DIRECTIVES: Discussed: yes On File: NO Materials Provided:YES MAILED   Immunization status:  Immunization History  Administered Date(s) Administered  . Influenza Split 02/18/2012  . Influenza,inj,Quad PF,6+ Mos 02/16/2013, 04/18/2017, 02/28/2018  . Influenza-Unspecified 03/25/2014, 01/02/2016  . Pneumococcal Conjugate-13 02/19/2019  . Pneumococcal Polysaccharide-23 06/21/2014, 12/02/2014  . Zoster 08/06/2014     Health Maintenance Due  Topic Date Due  . DEXA SCAN  01/16/2019     Functional Status Survey: Does the patient have difficulty seeing, even when wearing glasses/contacts?: No Does the patient have difficulty concentrating, remembering, or making decisions?: No Does the patient have difficulty walking or climbing stairs?: Yes Does the patient have difficulty dressing or bathing?: No Does the patient have difficulty doing errands alone such as visiting a doctor's office or shopping?: No   6CIT Screen 05/16/2019 04/18/2017  What Year? 0 points 0 points  What month? 0  points 0 points  What time? 0 points 0 points  Count back from 20 0 points 0 points  Months in reverse 0 points 0 points  Repeat phrase 4 points 0 points  Total Score 4 0        Clinical Support from 05/16/2019 in Primary Care at [facility]  AUDIT-C Score  0       Home Environment:   Has a little trouble climbing stairs (bad back) One story home. No scattered rugs Yes grab bars Adequate lighting/no clutter  Timed warm up  N/A    Patient Active Problem List   Diagnosis Date Noted  . Rotator cuff tear arthropathy of left shoulder 10/20/2018  . Arthritis of left acromioclavicular joint 10/20/2018  . Osteoarthritis of left shoulder 10/20/2018  . URI (upper respiratory infection) 09/27/2018  . Community acquired pneumonia of left lower lobe of lung 09/25/2018  . Head injury 09/08/2018  . Fall as cause of accidental injury at home as place of occurrence 09/08/2018  . Hallucination, visual 09/14/2017  . Prediabetes 09/14/2017  . Insomnia due to other mental disorder 09/14/2017  . Dream anxiety disorder 09/14/2017  . Vivid dream 09/14/2017  . Altered mental status 06/23/2017  . Irritable bowel syndrome with diarrhea 04/25/2017  . Sacroiliac joint dysfunction of right side 03/17/2017  . 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  . Seizure-like activity (HCC) 09/14/2016  . Moderate episode of recurrent major depressive disorder (HCC) 08/31/2016  . PTSD (post-traumatic stress disorder) 08/31/2016  . Recurrent UTI 06/25/2016  . Severe recurrent depression with psychosis (HCC) 06/25/2016  . GERD (gastroesophageal reflux disease) 11/13/2015  . Generalized anxiety disorder 06/14/2015  . Chronic pain of right lower extremity 05/14/2015  . Neuropathy of lower extremity 05/14/2015  .  Headache, migraine 01/17/2015  . Severe recurrent major depressive disorder with psychotic features (HCC)   . Major depression, recurrent, chronic (HCC) 12/02/2014  . Mood disorder (HCC)  12/01/2014  . Chronic back pain   . Hyperparathyroidism, 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  . Neuromuscular disorder (HCC) 07/31/2014  . Lower back pain 09/21/2013  . Diastolic heart failure (HCC) 03/17/2012  . Syncope and collapse 02/12/2012  . Chronic kidney disease 02/04/2012  . Edema 02/04/2012  . Tachycardia 02/04/2012  . Encounter for long-term (current) use of medications 02/04/2012  . Anxiety and depression 02/04/2012  . Hypothyroid 02/04/2012  . Hyperlipidemia 02/04/2012     Past Medical History:  Diagnosis Date  . Acute encephalopathy 06/25/2016  . Anxiety   . Cataract 08/31/2017   left eye  . Cholesterol serum increased   . Chronic back pain    chronic Rt low back pain. s/p L4-5 fusion. failed Rt facet injections. poss due to Rt SI joint dysfunction.  . Chronic kidney disease   . Depression   . Diastolic heart failure (HCC)   . GERD (gastroesophageal reflux disease)   . Hyperparathyroidism (HCC)   . Hypertension   . Hypothyroidism   . Migraine   . Neuromuscular disorder (HCC)   . Sleep apnea   . Syncope 08/30/2015  . Tachycardia   . Trochanteric bursitis of both hips 2012   Confirmed on MRI     Past Surgical History:  Procedure Laterality Date  . ABDOMINAL HYSTERECTOMY    . APPENDECTOMY  1986  . BACK SURGERY     L4-5 fusion  . CATARACT EXTRACTION    . KNEE SURGERY    . SHOULDER ARTHROSCOPY WITH SUBACROMIAL DECOMPRESSION Left 10/20/2018   Procedure: SHOULDER ARTHROSCOPY, ROTATOR CUFF DEBRIDEMENT, GLENOHUMERAL JOINT DEBRIDEMENT, ACROMIOPLASTY, DISTAL CLAVICLE RESECTION;  Surgeon: Jodi GeraldsGraves, John, MD;  Location: [facility];  Service: Orthopedics;  Laterality: Left;     Family History  Problem Relation Age of Onset  . Diabetes Mother   . Heart disease Mother   . Kidney disease Mother   . Thyroid disease Mother   . Heart disease Father   . Seizures Father   . COPD Father   . Irritable bowel syndrome Father   . Cancer Maternal Grandmother   . Diabetes Sister    . Diabetes Brother   . Colon cancer Neg Hx   . Stomach cancer Neg Hx      Social History   Socioeconomic History  . Marital status: Married    Spouse name: Not on file  . Number of children: 3  . Years of education: 2912  . Highest education level: Some college, no degree  Occupational History  . Not on file  Tobacco Use  . Smoking status: Never Smoker  . Smokeless tobacco: Never Used  Substance and Sexual Activity  . Alcohol use: No  . Drug use: No    Comment: 08-31-2016 PER PT NO   . Sexual activity: Yes    Partners: Male    Comment: married  Other Topics Concern  . Not on file  Social History Narrative  . Not on file   Social Determinants of Health   Financial Resource Strain:   . Difficulty of Paying Living Expenses: Not on file  Food Insecurity:   . Worried About Programme researcher, broadcasting/film/videounning Out of Food in the Last Year: Not on file  . Ran Out of Food in the Last Year: Not on file  Transportation Needs:   . Lack of Transportation (Medical): Not  on file  . Lack of Transportation (Non-Medical): Not on file  Physical Activity:   . Days of Exercise per Week: Not on file  . Minutes of Exercise per Session: Not on file  Stress:   . Feeling of Stress : Not on file  Social Connections:   . Frequency of Communication with Friends and Family: Not on file  . Frequency of Social Gatherings with Friends and Family: Not on file  . Attends Religious Services: Not on file  . Active Member of Clubs or Organizations: Not on file  . Attends Banker Meetings: Not on file  . Marital Status: Not on file  Intimate Partner Violence:   . Fear of Current or Ex-Partner: Not on file  . Emotionally Abused: Not on file  . Physically Abused: Not on file  . Sexually Abused: Not on file     Allergies  Allergen Reactions  . Tramadol Nausea And Vomiting  . Trazodone And Nefazodone Other (See Comments)    Per pt trazodone caused hallucinations and behavior changes      Prior to  Admission medications   Medication Sig Start Date End Date Taking? Authorizing Provider  atorvastatin (LIPITOR) 20 MG tablet Take 1 tablet (20 mg total) by mouth daily. 02/19/19  Yes Myles Lipps, MD  buPROPion (WELLBUTRIN XL) 150 MG 24 hr tablet TAKE 1 TABLET BY MOUTH EVERY DAY 04/26/19  Yes Myles Lipps, MD  busPIRone (BUSPAR) 5 MG tablet TAKE 1 TABLET THREE TIMES DAILY. MAY TAKE 4TH DOSE IN EVENING FOR ANXIETY EXACERBATION 03/30/19  Yes Myles Lipps, MD  diclofenac sodium (VOLTAREN) 1 % GEL Apply 2 g topically daily as needed (for knee pain). 08/23/18  Yes Myles Lipps, MD  DULoxetine (CYMBALTA) 60 MG capsule TAKE 1 CAPSULE EVERY DAY 04/11/19  Yes Myles Lipps, MD  esomeprazole (NEXIUM) 40 MG capsule TAKE 1 CAPSULE EVERY DAY 04/11/19  Yes Myles Lipps, MD  fluticasone (FLONASE) 50 MCG/ACT nasal spray PLACE 2 SPRAYS AT BEDTIME INTO BOTH NOSTRILS. Patient taking differently: Place 1 spray into both nostrils 2 (two) times a day.  08/23/18  Yes Myles Lipps, MD  gabapentin (NEURONTIN) 600 MG tablet TAKE 1 TABLET THREE TIMES DAILY 03/30/19  Yes Myles Lipps, MD  hydrOXYzine (ATARAX/VISTARIL) 50 MG tablet TAKE 1 TABLET EVERY 6 HOURS AS NEEDED 04/11/19  Yes Myles Lipps, MD  levothyroxine (SYNTHROID) 75 MCG tablet Take 1 tablet (75 mcg total) by mouth daily before breakfast. 02/19/19  Yes Myles Lipps, MD  meclizine (ANTIVERT) 25 MG tablet TAKE 1 TABLET BY MOUTH 3 TIMES DAILY AS NEEDED FOR DIZZINESS 08/23/18  Yes Myles Lipps, MD  Melatonin 10 MG TABS Take 1 tablet by mouth daily.   Yes [provider]  ondansetron (ZOFRAN-ODT) 8 MG disintegrating tablet Take 0.5-1 tablets (4-8 mg total) by mouth every 8 (eight) hours as needed for nausea (from migraines). 11/20/18  Yes Myles Lipps, MD  prazosin (MINIPRESS) 2 MG capsule TAKE 2 TABLETS BY MOUTH ALONG WITH THE  TABLET TO MAKE A TOTAL OF  DOSE AT BEDTIME 04/30/19  Yes Myles Lipps, MD  prazosin  (MINIPRESS) 5 MG capsule Take 2 capsules (10 mg total) by mouth at bedtime. TAKE 2 TO 3 CAPSULES AT BEDTIME 04/30/19  Yes Myles Lipps, MD  QUEtiapine (SEROQUEL) 300 MG tablet TAKE 1 TABLET BY MOUTH EVERYDAY AT BEDTIME 04/26/19  Yes Myles Lipps, MD  QUEtiapine (SEROQUEL) 50 MG tablet  TAKE 1 TABLET AT BEDTIME AS NEEDED 04/16/19  Yes Rutherford Guys, MD  rizatriptan (MAXALT-MLT) 10 MG disintegrating tablet Take 1 tablet (10 mg total) by mouth as needed for migraine. May repeat in 2 hours if needed 10/14/17  Yes Shawnee Knapp, MD  famotidine (PEPCID) 40 MG tablet  01/10/19   [provider]     Depression screen [facility] 2/9 05/16/2019 02/19/2019 11/20/2018 11/20/2018 10/26/2018  Decreased Interest 0 0 1 0 1  Down, Depressed, Hopeless 0 0 1 0 1  PHQ - 2 Score 0 0 2 0 2  Altered sleeping - - 2 - 2  Tired, decreased energy - - 2 - 2  Change in appetite - - 1 - 2  Feeling bad or failure about yourself  - - 1 - 1  Trouble concentrating - - 1 - 2  Moving slowly or fidgety/restless - - 1 - 2  Suicidal thoughts - - - - 0  PHQ-9 Score - - 10 - 13  Difficult doing work/chores - - Not difficult at all - Somewhat difficult  Some recent data might be hidden     Fall Risk  05/16/2019 02/19/2019 11/20/2018 10/26/2018 09/22/2018  Falls in the past year? 1 0 0 1 0  Comment loose balance / did not go to hospital - - - -  Number falls in past yr: 1 0 0 1 0  Injury with Fall? 1 0 0 1 0  Comment - - - - -  Risk Factor Category  - - - - -  Risk for fall due to : History of fall(s) - - - -  Risk for fall due to: Comment - - - - -  Follow up Falls evaluation completed;Education provided - - - -      PHYSICAL EXAM: BP 116/80 Comment: previous visit  Ht 5\' 3"  (1.6 m)   Wt 211 lb (95.7 kg)   BMI 37.38 kg/m    Wt Readings from Last 3 Encounters:  05/16/19 211 lb (95.7 kg)  02/19/19 211 lb 9.6 oz (96 kg)  11/20/18 212 lb (96.2 kg)    Medicare annual wellness visit,  subsequent     Education/Counseling provided regarding diet and exercise, prevention of chronic diseases, smoking/tobacco cessation, if applicable, and reviewed "Covered Medicare Preventive Services."

## 2019-05-21 ENCOUNTER — Encounter: Payer: Self-pay | Admitting: Family Medicine

## 2019-05-21 ENCOUNTER — Other Ambulatory Visit: Payer: Self-pay

## 2019-05-21 ENCOUNTER — Ambulatory Visit (INDEPENDENT_AMBULATORY_CARE_PROVIDER_SITE_OTHER): Payer: Medicare Other | Admitting: Family Medicine

## 2019-05-21 VITALS — BP 108/66 | HR 100 | Temp 98.7°F | Wt 217.4 lb

## 2019-05-21 DIAGNOSIS — E782 Mixed hyperlipidemia: Secondary | ICD-10-CM | POA: Diagnosis not present

## 2019-05-21 DIAGNOSIS — R42 Dizziness and giddiness: Secondary | ICD-10-CM | POA: Diagnosis not present

## 2019-05-21 DIAGNOSIS — R7303 Prediabetes: Secondary | ICD-10-CM | POA: Diagnosis not present

## 2019-05-21 DIAGNOSIS — Z1211 Encounter for screening for malignant neoplasm of colon: Secondary | ICD-10-CM

## 2019-05-21 DIAGNOSIS — R296 Repeated falls: Secondary | ICD-10-CM | POA: Diagnosis not present

## 2019-05-21 DIAGNOSIS — F4310 Post-traumatic stress disorder, unspecified: Secondary | ICD-10-CM | POA: Diagnosis not present

## 2019-05-21 DIAGNOSIS — E039 Hypothyroidism, unspecified: Secondary | ICD-10-CM

## 2019-05-21 DIAGNOSIS — K219 Gastro-esophageal reflux disease without esophagitis: Secondary | ICD-10-CM | POA: Diagnosis not present

## 2019-05-21 MED ORDER — LEVOTHYROXINE SODIUM 75 MCG PO TABS: 75.0000 ug | ORAL_TABLET | ORAL | 1 refills | Status: DC

## 2019-05-21 MED ORDER — LEVOTHYROXINE SODIUM 50 MCG PO TABS: 50.0000 ug | ORAL_TABLET | ORAL | 1 refills | Status: DC

## 2019-05-21 MED ORDER — FAMOTIDINE 40 MG PO TABS: 40.0000 mg | ORAL_TABLET | Freq: Every day | ORAL | 3 refills | Status: DC

## 2019-05-21 NOTE — Patient Instructions (Signed)
° ° ° °  If you have lab work done today you will be contacted with your lab results within the next 2 weeks.  If you have not heard from us then please contact us. The fastest way to get your results is to register for My Chart. ° ° °IF you received an x-ray today, you will receive an invoice from [facility]. Please contact [facility] at [phone] with questions or concerns regarding your invoice.  ° °IF you received labwork today, you will receive an invoice from [facility]. Please contact [facility] at [phone] with questions or concerns regarding your invoice.  ° °Our billing staff will not be able to assist you with questions regarding bills from these companies. ° °You will be contacted with the lab results as soon as they are available. The fastest way to get your results is to activate your My Chart account. Instructions are located on the last page of this paperwork. If you have not heard from us regarding the results in 2 weeks, please contact this office. °  ° ° ° °

## 2019-05-21 NOTE — Progress Notes (Signed)
1/4/20219:20 AM  Jennifer Davidson April 22, 1956, 64 y.o., female [id number]  Chief Complaint  Patient presents with  . Medical Management of Chronic Issues     3 month f/u  . Fall    Patient had a fall on 05/13/19 injury to rt leg, left arm and left eye. But is doing fine    HPI:   Patient is a 64 y.o. female with past medical history significant for hypothyroidism, anxiety, depression, HLP, prediabetes,OSA on cpapwho presents today forfollowup  Last OV oct 2020 - started atorvastatin  On dec 27th she stood up suddenly in the early afternoon, felt dizzy (off balance), lost her balance and fell, hitting several parts of her body including the side of her head. No LOC, headache, vision changes or nausea Denies any chest pain, palpitations, SOB, fatigue, nausea prior to fall She had not taken any vistaril that day Takes prazosin and seroquel at bedtime  She had a previous fall about 4 months ago, was standing talking to her husband when suddenly felt dizzy (off balance) and fell, no LOC Had echo in 2017 for syncope -  Normal, LVEF 60-65% Ct head in April 2020 - no intracranial findings  She takes famotidine and omeprazole for her GERD, she has hiatal hernia, no history of ulcer Due for colonoscopy, reports normal 10 years ago  Tolerating atorvastatin well - denies any side effects  She has been feeling jittery and restless for past several months Not anxious or nervous, just restless, her hands shakes Used to be on 59mcg, increased to 41mcg Feels her PTSD, depression are well controlled   Lab Results  Component Value Date   CHOL 153 05/15/2019   HDL 56 05/15/2019   LDLCALC 68 05/15/2019   TRIG 174 (H) 05/15/2019   CHOLHDL 2.7 05/15/2019   Previous TC 275, LDL 151, TG 364  Lab Results  Component Value Date   CREATININE 1.59 (H) 05/15/2019   BUN 13 05/15/2019   NA 140 05/15/2019   K 4.3 05/15/2019   CL 103 05/15/2019   CO2 24 05/15/2019   Lab Results  Component  Value Date   ALT 22 05/15/2019   AST 18 05/15/2019   ALKPHOS 90 05/15/2019   BILITOT 0.4 05/15/2019   Lab Results  Component Value Date   TSH 0.960 05/15/2019   Lab Results  Component Value Date   HGBA1C 6.0 (H) 05/15/2019    Depression screen PHQ 2/9 05/21/2019 05/16/2019 02/19/2019  Decreased Interest 0 0 0  Down, Depressed, Hopeless 0 0 0  PHQ - 2 Score 0 0 0  Altered sleeping - - -  Tired, decreased energy - - -  Change in appetite - - -  Feeling bad or failure about yourself  - - -  Trouble concentrating - - -  Moving slowly or fidgety/restless - - -  Suicidal thoughts - - -  PHQ-9 Score - - -  Difficult doing work/chores - - -  Some recent data might be hidden    Fall Risk  05/21/2019 05/16/2019 02/19/2019 11/20/2018 10/26/2018  Falls in the past year? 1 1 0 0 1  Comment - loose balance / did not go to hospital - - -  Number falls in past yr: 1 1 0 0 1  Injury with Fall? 1 1 0 0 1  Comment rt leg, lt arm and lt eye - - - -  Risk Factor Category  - - - - -  Risk for fall due to :  History of fall(s);Impaired balance/gait History of fall(s) - - -  Risk for fall due to: Comment - - - - -  Follow up Falls evaluation completed Falls evaluation completed;Education provided - - -     Allergies  Allergen Reactions  . Tramadol Nausea And Vomiting  . Trazodone And Nefazodone Other (See Comments)    Per pt trazodone caused hallucinations and behavior changes     Prior to Admission medications   Medication Sig Start Date End Date Taking? Authorizing Provider  atorvastatin (LIPITOR) 20 MG tablet Take 1 tablet (20 mg total) by mouth daily. 02/19/19  Yes Myles Lipps, MD  buPROPion (WELLBUTRIN XL) 150 MG 24 hr tablet TAKE 1 TABLET BY MOUTH EVERY DAY 04/26/19  Yes Myles Lipps, MD  busPIRone (BUSPAR) 5 MG tablet TAKE 1 TABLET THREE TIMES DAILY. MAY TAKE 4TH DOSE IN EVENING FOR ANXIETY EXACERBATION 03/30/19  Yes Myles Lipps, MD  diclofenac sodium (VOLTAREN) 1 % GEL  Apply 2 g topically daily as needed (for knee pain). 08/23/18  Yes Myles Lipps, MD  DULoxetine (CYMBALTA) 60 MG capsule TAKE 1 CAPSULE EVERY DAY 04/11/19  Yes Myles Lipps, MD  esomeprazole (NEXIUM) 40 MG capsule TAKE 1 CAPSULE EVERY DAY 04/11/19  Yes Myles Lipps, MD  famotidine (PEPCID) 40 MG tablet  01/10/19  Yes [provider]  fluticasone (FLONASE) 50 MCG/ACT nasal spray PLACE 2 SPRAYS AT BEDTIME INTO BOTH NOSTRILS. Patient taking differently: Place 1 spray into both nostrils 2 (two) times a day.  08/23/18  Yes Myles Lipps, MD  gabapentin (NEURONTIN) 600 MG tablet TAKE 1 TABLET THREE TIMES DAILY 03/30/19  Yes Myles Lipps, MD  hydrOXYzine (ATARAX/VISTARIL) 50 MG tablet TAKE 1 TABLET EVERY 6 HOURS AS NEEDED 04/11/19  Yes Myles Lipps, MD  levothyroxine (SYNTHROID) 75 MCG tablet Take 1 tablet (75 mcg total) by mouth daily before breakfast. 02/19/19  Yes Myles Lipps, MD  meclizine (ANTIVERT) 25 MG tablet TAKE 1 TABLET BY MOUTH 3 TIMES DAILY AS NEEDED FOR DIZZINESS 08/23/18  Yes Myles Lipps, MD  Melatonin 10 MG TABS Take 1 tablet by mouth daily.   Yes [provider]  ondansetron (ZOFRAN-ODT) 8 MG disintegrating tablet Take 0.5-1 tablets (4-8 mg total) by mouth every 8 (eight) hours as needed for nausea (from migraines). 11/20/18  Yes Myles Lipps, MD  prazosin (MINIPRESS) 2 MG capsule TAKE 2 TABLETS BY MOUTH ALONG WITH THE 5MG  TABLET TO MAKE A TOTAL OF 9MG  DOSE AT BEDTIME 04/30/19  Yes , MD  prazosin (MINIPRESS) 5 MG capsule Take 2 capsules (10 mg total) by mouth at bedtime. TAKE 2 TO 3 CAPSULES AT BEDTIME 04/30/19  Yes Myles Lipps, MD  QUEtiapine (SEROQUEL) 300 MG tablet TAKE 1 TABLET BY MOUTH EVERYDAY AT BEDTIME 04/26/19  Yes Myles Lipps, MD  QUEtiapine (SEROQUEL) 50 MG tablet TAKE 1 TABLET AT BEDTIME AS NEEDED 04/16/19  Yes Myles Lipps, MD  rizatriptan (MAXALT-MLT) 10 MG disintegrating tablet Take 1 tablet (10 mg  total) by mouth as needed for migraine. May repeat in 2 hours if needed 10/14/17  Yes Myles Lipps, MD    Past Medical History:  Diagnosis Date  . Acute encephalopathy 06/25/2016  . Anxiety   . Cataract 08/31/2017   left eye  . Cholesterol serum increased   . Chronic back pain    chronic Rt low back pain. s/p L4-5 fusion. failed Rt facet injections. poss due  to Rt SI joint dysfunction.  . Chronic kidney disease   . Depression   . Diastolic heart failure (HCC)   . GERD (gastroesophageal reflux disease)   . Hyperparathyroidism (HCC)   . Hypertension   . Hypothyroidism   . Migraine   . Neuromuscular disorder (HCC)   . Sleep apnea   . Syncope 08/30/2015  . Tachycardia   . Trochanteric bursitis of both hips 2012   Confirmed on MRI    Past Surgical History:  Procedure Laterality Date  . ABDOMINAL HYSTERECTOMY    . APPENDECTOMY  1986  . BACK SURGERY     L4-5 fusion  . CATARACT EXTRACTION    . KNEE SURGERY    . SHOULDER ARTHROSCOPY WITH SUBACROMIAL DECOMPRESSION Left 10/20/2018   Procedure: SHOULDER ARTHROSCOPY, ROTATOR CUFF DEBRIDEMENT, GLENOHUMERAL JOINT DEBRIDEMENT, ACROMIOPLASTY, DISTAL CLAVICLE RESECTION;  Surgeon: Jodi Geralds, MD;  Location: [facility];  Service: Orthopedics;  Laterality: Left;    Social History   Tobacco Use  . Smoking status: Never Smoker  . Smokeless tobacco: Never Used  Substance Use Topics  . Alcohol use: No    Family History  Problem Relation Age of Onset  . Diabetes Mother   . Heart disease Mother   . Kidney disease Mother   . Thyroid disease Mother   . Heart disease Father   . Seizures Father   . COPD Father   . Irritable bowel syndrome Father   . Cancer Maternal Grandmother   . Diabetes Sister   . Diabetes Brother   . Colon cancer Neg Hx   . Stomach cancer Neg Hx     Review of Systems  Constitutional: Negative for chills and fever.  Respiratory: Negative for cough and shortness of breath.   Cardiovascular: Negative for chest pain,  palpitations and leg swelling.  Gastrointestinal: Negative for abdominal pain, nausea and vomiting.   Per hpi  OBJECTIVE:  Today's Vitals   05/21/19 0906  BP: 108/66  Pulse: 100  Temp: 98.7 F (37.1 C)  TempSrc: Temporal  SpO2: 96%  Weight: 217 lb 6.4 oz (98.6 kg)   Body mass index is 38.51 kg/m.  Orthostatic VS for the past 24 hrs (Last 3 readings):  BP- Lying Pulse- Lying BP- Standing at 0 minutes Pulse- Standing at 0 minutes BP- Standing at 3 minutes Pulse- Standing at 3 minutes  05/21/19 1004 127/79 84 112/73 96 129/81 95    Physical Exam Vitals and nursing note reviewed.  Constitutional:      Appearance: She is well-developed.  HENT:     Head: Normocephalic and atraumatic.     Right Ear: Hearing, tympanic membrane, ear canal and external ear normal.     Left Ear: Hearing, tympanic membrane, ear canal and external ear normal.     Mouth/Throat:     Mouth: Mucous membranes are moist.  Eyes:     Extraocular Movements: Extraocular movements intact.     Right eye: No nystagmus.     Left eye: No nystagmus.     Conjunctiva/sclera: Conjunctivae normal.     Pupils: Pupils are equal, round, and reactive to light.  Neck:     Thyroid: No thyroid mass or thyromegaly.     Vascular: No carotid bruit.  Cardiovascular:     Rate and Rhythm: Normal rate and regular rhythm.     Heart sounds: Normal heart sounds. No murmur. No friction rub. No gallop.   Pulmonary:     Effort: Pulmonary effort is normal.  Breath sounds: Normal breath sounds. No wheezing, rhonchi or rales.  Musculoskeletal:     Cervical back: Neck supple.  Lymphadenopathy:     Cervical: No cervical adenopathy.  Skin:    General: Skin is warm and dry.  Neurological:     Mental Status: She is alert and oriented to person, place, and time.     Cranial Nerves: Cranial nerves are intact.     Motor: Motor function is intact. No weakness, tremor or pronator drift.     Coordination: Coordination is intact. Romberg  sign negative.     Gait: Gait is intact.     Deep Tendon Reflexes: Reflexes are normal and symmetric.    My interpretation of EKG:  NSR, HR 86, normal intervals, nonspecific t wave changes, unchanged to prior  No results found for this or any previous visit (from the past 24 hour(s)).  No results found.   ASSESSMENT and PLAN  1. Dizziness 2. Recurrent falls Orthostatics and ekg unremarkable. Patient with recurrent presyncopal episodes. Most likely vasovagal, dehydration, discussed supportive measures, referral to cards for further eval and treatment.  - Orthostatic vital signs - EKG 12-Lead - Ambulatory referral to Cardiology  3. Mixed hyperlipidemia Much improved, LDL at goal. TG very close. Discussed LFM.  4. Screen for colon cancer - Ambulatory referral to Gastroenterology  5. Gastroesophageal reflux disease, unspecified whether esophagitis present Controlled. Continue current regime.   6. Acquired hypothyroidism On lower end of normal. Patient feeling restless, jittery...trial of increasing TSH within mid range. - TSH; Future - TSH  7. Prediabetes Stable. Cont with LFM  8. PTSD (post-traumatic stress disorder) Controlled. Continue current regime.   Other orders - famotidine (PEPCID) 40 MG tablet; Take 1 tablet (40 mg total) by mouth daily. - levothyroxine (SYNTHROID) 75 MCG tablet; Take 1 tablet (75 mcg total) by mouth every other day. Before breakfast, alternate with tablet. - levothyroxine (SYNTHROID) 50 MCG tablet; Take 1 tablet (50 mcg total) by mouth every other day. Before breakfast, alternated with tablet.  Return in about 2 months (around 07/19/2019) for please do labs 3-4 days prior to appt.    Myles Lipps, MD Primary Care at [facility] [address] [place], [place] [address] Ph.  [phone] Fax [phone]

## 2019-05-22 ENCOUNTER — Encounter: Payer: Self-pay | Admitting: Gastroenterology

## 2019-05-22 ENCOUNTER — Telehealth (INDEPENDENT_AMBULATORY_CARE_PROVIDER_SITE_OTHER): Payer: Medicare Other | Admitting: Cardiology

## 2019-05-22 ENCOUNTER — Telehealth: Payer: Self-pay | Admitting: Radiology

## 2019-05-22 VITALS — BP 108/66 | Ht 63.0 in | Wt 211.0 lb

## 2019-05-22 DIAGNOSIS — I5032 Chronic diastolic (congestive) heart failure: Secondary | ICD-10-CM

## 2019-05-22 DIAGNOSIS — R42 Dizziness and giddiness: Secondary | ICD-10-CM | POA: Diagnosis not present

## 2019-05-22 DIAGNOSIS — R0602 Shortness of breath: Secondary | ICD-10-CM

## 2019-05-22 DIAGNOSIS — I10 Essential (primary) hypertension: Secondary | ICD-10-CM | POA: Diagnosis not present

## 2019-05-22 DIAGNOSIS — E7800 Pure hypercholesterolemia, unspecified: Secondary | ICD-10-CM | POA: Diagnosis not present

## 2019-05-22 LAB — TSH: TSH: 1.29 u[IU]/mL (ref 0.450–4.500)

## 2019-05-22 MED ORDER — METOPROLOL TARTRATE 100 MG PO TABS: 100.0000 mg | ORAL_TABLET | Freq: Once | ORAL | 0 refills | Status: DC

## 2019-05-22 NOTE — Progress Notes (Signed)
Virtual Visit via Telephone Note   This visit type was conducted due to national recommendations for restrictions regarding the COVID-19 Pandemic (e.g. social distancing) in an effort to limit this patient's exposure and mitigate transmission in our community.  Due to her co-morbid illnesses, this patient is at least at moderate risk for complications without adequate follow up.  This format is felt to be most appropriate for this patient at this time.  The patient did not have access to video technology/had technical difficulties with video requiring transitioning to audio format only (telephone).  All issues noted in this document were discussed and addressed.  No physical exam could be performed with this format.  Please refer to the patient's chart for her  consent to telehealth for [facility].   Evaluation Performed:  Cardiology Consult  This visit type was conducted due to national recommendations for restrictions regarding the COVID-19 Pandemic (e.g. social distancing).  This format is felt to be most appropriate for this patient at this time.  All issues noted in this document were discussed and addressed.  No physical exam was performed (except for noted visual exam findings with Video Visits).  Please refer to the patient's chart (MyChart message for video visits and phone note for telephone visits) for the patient's consent to telehealth for [facility].  Date:  05/22/2019   ID:  Jennifer Davidson, DOB 1955-09-30, MRN [id number]  Patient Location:  Home  Provider location:   [place]  PCP:  Rutherford Guys, MD  Cardiologist:  Fransico Him, MD  Electrophysiologist:  None   Chief Complaint:  dizziness  History of Present Illness:    Jennifer Davidson is a 64 y.o. female who presents via audio/video conferencing for a telehealth visit today in referral by Grant Fontana, MD for evaluation of dizziness.    This is a 64yo female with a hx of GERD, HTN, hypothyroidism, OSA on PAP  therapy and diastolic CHF.  She has recently been having problems with dizziness and was referred for evaluation by her PCP.  She has had dizzy spells for quite some time but usually occurred when going from sitting to standing and then would resolve.    About 4 months ago she was standing in the hall talking to her husband and then all of a sudden felt like she was losing her balance and fell down.  She was not lightheaded and did not lose consciousness.  She had a syncopal spell about 2 years ago when standing out in the driveway talking to her husband and was evaluated in the ER and workup was normal.  2D echo was normal at that time but she broke 6 ribs.  That time she felt lightheaded and felt like she was going to pass out.    The episodes she is having now are different from her event in 2017.  She also had had episodes sitting down reading where suddenly room starts to spin and she gets very dizzy.  She fell again 12/27 while walking across her living room floor and she lost control and fell into an ottoman.  She had no lightheadedness and did not feel like she was going to pass out.  She occasionally will notice palpitations if she has a bad fall which she attributes to being nervous after the fall but has no palpitations prior to the event.  She has not had any nausea prior to the events but has been diaphoretic after several of her falls.  She denies any chest pain or pressure.  She does have DOE which is pretty significant to the point she gets SOB with minimal walking outside but is able to use a stationary bike without problems.  She has chronic LE edema. She has never smoked.  Her mother had a hx of CAD with CABG.    The patient does not have symptoms concerning for COVID-19 infection (fever, chills, cough, or new shortness of breath).    Prior CV studies:   The following studies were reviewed today:  2D echo 2017  Past Medical History:  Diagnosis Date  . Acute encephalopathy  06/25/2016  . Anxiety   . Cataract 08/31/2017   left eye  . Cholesterol serum increased   . Chronic back pain    chronic Rt low back pain. s/p L4-5 fusion. failed Rt facet injections. poss due to Rt SI joint dysfunction.  . Chronic kidney disease   . Depression   . Diastolic heart failure (HCC)   . GERD (gastroesophageal reflux disease)   . Hyperparathyroidism (HCC)   . Hypertension   . Hypothyroidism   . Migraine   . Neuromuscular disorder (HCC)   . Sleep apnea   . Syncope 08/30/2015  . Tachycardia   . Trochanteric bursitis of both hips 2012   Confirmed on MRI   Past Surgical History:  Procedure Laterality Date  . ABDOMINAL HYSTERECTOMY    . APPENDECTOMY  1986  . BACK SURGERY     L4-5 fusion  . CATARACT EXTRACTION    . KNEE SURGERY    . SHOULDER ARTHROSCOPY WITH SUBACROMIAL DECOMPRESSION Left 10/20/2018   Procedure: SHOULDER ARTHROSCOPY, ROTATOR CUFF DEBRIDEMENT, GLENOHUMERAL JOINT DEBRIDEMENT, ACROMIOPLASTY, DISTAL CLAVICLE RESECTION;  Surgeon: Jodi Geralds, MD;  Location: [facility];  Service: Orthopedics;  Laterality: Left;     Current Meds  Medication Sig  . atorvastatin (LIPITOR) 20 MG tablet Take 1 tablet (20 mg total) by mouth daily.  Marland Kitchen buPROPion (WELLBUTRIN XL) 150 MG 24 hr tablet TAKE 1 TABLET BY MOUTH EVERY DAY  . busPIRone (BUSPAR) 5 MG tablet TAKE 1 TABLET THREE TIMES DAILY. MAY TAKE 4TH DOSE IN EVENING FOR ANXIETY EXACERBATION  . diclofenac sodium (VOLTAREN) 1 % GEL Apply 2 g topically daily as needed (for knee pain).  . DULoxetine (CYMBALTA) 60 MG capsule TAKE 1 CAPSULE EVERY DAY  . esomeprazole (NEXIUM) 40 MG capsule TAKE 1 CAPSULE EVERY DAY  . fluticasone (FLONASE) 50 MCG/ACT nasal spray PLACE 2 SPRAYS AT BEDTIME INTO BOTH NOSTRILS. (Patient taking differently: Place 1 spray into both nostrils 2 (two) times a day. )  . gabapentin (NEURONTIN) 600 MG tablet TAKE 1 TABLET THREE TIMES DAILY  . hydrOXYzine (ATARAX/VISTARIL) 50 MG tablet TAKE 1 TABLET EVERY 6 HOURS AS  NEEDED  . levothyroxine (SYNTHROID) 50 MCG tablet Take 1 tablet (50 mcg total) by mouth every other day. Before breakfast, alternated with tablet.  Marland Kitchen levothyroxine (SYNTHROID) 75 MCG tablet Take 1 tablet (75 mcg total) by mouth every other day. Before breakfast, alternate with tablet.  . meclizine (ANTIVERT) 25 MG tablet TAKE 1 TABLET BY MOUTH 3 TIMES DAILY AS NEEDED FOR DIZZINESS  . Melatonin 10 MG TABS Take 1 tablet by mouth daily.  . ondansetron (ZOFRAN-ODT) 8 MG disintegrating tablet Take 0.5-1 tablets (4-8 mg total) by mouth every 8 (eight) hours as needed for nausea (from migraines).  . prazosin (MINIPRESS) 2 MG capsule TAKE 2 TABLETS BY MOUTH ALONG WITH THE 5MG  TABLET TO MAKE A TOTAL OF  9MG  DOSE AT BEDTIME  . prazosin (MINIPRESS) 5 MG capsule Take 2 capsules (10 mg total) by mouth at bedtime. TAKE 2 TO 3 CAPSULES AT BEDTIME  . QUEtiapine (SEROQUEL) 300 MG tablet TAKE 1 TABLET BY MOUTH EVERYDAY AT BEDTIME  . QUEtiapine (SEROQUEL) 50 MG tablet TAKE 1 TABLET AT BEDTIME AS NEEDED  . rizatriptan (MAXALT-MLT) 10 MG disintegrating tablet Take 1 tablet (10 mg total) by mouth as needed for migraine. May repeat in 2 hours if needed     Allergies:   Tramadol and Trazodone and nefazodone   Social History   Tobacco Use  . Smoking status: Never Smoker  . Smokeless tobacco: Never Used  Substance Use Topics  . Alcohol use: No  . Drug use: No    Comment: 08-31-2016 PER PT NO      Family Hx: The patient's family history includes COPD in her father; Cancer in her maternal grandmother; Diabetes in her brother, mother, and sister; Heart disease in her father and mother; Irritable bowel syndrome in her father; Kidney disease in her mother; Seizures in her father; Thyroid disease in her mother. There is no history of Colon cancer or Stomach cancer.  ROS:   Please see the history of present illness.     All other systems reviewed and are negative.   Labs/Other Tests and Data Reviewed:      Recent Labs: 10/26/2018: Hemoglobin 12.1; Platelets 270 05/15/2019: ALT 22; BUN 13; Creatinine, Ser 1.59; Potassium 4.3; Sodium 140 05/21/2019: TSH 1.290   Recent Lipid Panel Lab Results  Component Value Date/Time   CHOL 153 05/15/2019 08:13 AM   TRIG 174 (H) 05/15/2019 08:13 AM   HDL 56 05/15/2019 08:13 AM   CHOLHDL 2.7 05/15/2019 08:13 AM   CHOLHDL 5.5 (H) 01/29/2016 10:09 AM   LDLCALC 68 05/15/2019 08:13 AM    Wt Readings from Last 3 Encounters:  05/22/19 211 lb (95.7 kg)  05/21/19 217 lb 6.4 oz (98.6 kg)  05/16/19 211 lb (95.7 kg)     Objective:    Vital Signs:  BP 108/66 (BP Location: Right Arm, Patient Position: Sitting, Cuff Size: Normal)   Ht 5\' 3"  (1.6 m)   Wt 211 lb (95.7 kg)   BMI 37.38 kg/m     ASSESSMENT & PLAN:    1.  Dizziness -BP during the events usually low (106/34mmHg) -her sx really sound more vertiginous than lightheadedness -her BP is on the soft side -I encouraged her to drink at least 64oz of fluids daily and liberalize salt intake -I will get an event monitor to assess for arrhythmias   2.  SOB -? Due to obesity and deconditioning  -she does have CRFs including HTN, HLD and fm hx of CAD  3.  HTN -she has not been on any meds recently as BP has been on the soft  4.  HLD -followed by her PCP -continue statin  COVID-19 Education: The signs and symptoms of COVID-19 were discussed with the patient and how to seek care for testing (follow up with PCP or arrange E-visit).  The importance of social distancing was discussed today.  Patient Risk:   After full review of this patient's clinical status, I feel that they are at least moderate risk at this time.  Time:   Today, I have spent 20 minutes directly with the patient on telemedicine discussing medical problems including dizziness, SOB, HTN, HLD.  We also reviewed the symptoms of COVID 19 and the ways to protect against contracting  the virus with telehealth technology.  I spent an  additional 5 minutes reviewing patient's chart including 2D echo.  Medication Adjustments/Labs and Tests Ordered: Current medicines are reviewed at length with the patient today.  Concerns regarding medicines are outlined above.  Tests Ordered: No orders of the defined types were placed in this encounter.  Medication Changes: No orders of the defined types were placed in this encounter.   Disposition:  Follow up prn  Signed, Armanda Magic, MD  05/22/2019 1:09 PM     [facility]

## 2019-05-22 NOTE — Telephone Encounter (Signed)
Enrolled patient for a 30 day [employer] Event monitor to be mailed to patients home.  

## 2019-05-22 NOTE — Patient Instructions (Addendum)
Medication Instructions:  Your physician recommends that you continue on your current medications as directed. Please refer to the Current Medication list given to you today.  *If you need a refill on your cardiac medications before your next appointment, please call your pharmacy*  Lab Work: BMET prior to your CT scan.  If you have labs (blood work) drawn today and your tests are completely normal, you will receive your results only by: Marland Kitchen MyChart Message (if you have MyChart) OR . A paper copy in the mail If you have any lab test that is abnormal or we need to change your treatment, we will call you to review the results.  Testing/Procedures: Your physician has requested that you have cardiac CT. Cardiac computed tomography (CT) is a painless test that uses an x-ray machine to take clear, detailed pictures of your heart. For further information please visit HugeFiesta.tn. Please follow instruction sheet as given.  Your physician has requested that you have an echocardiogram. Echocardiography is a painless test that uses sound waves to create images of your heart. It provides your doctor with information about the size and shape of your heart and how well your heart's chambers and valves are working. This procedure takes approximately one hour. There are no restrictions for this procedure.  Your physician has recommended that you wear an event monitor. Event monitors are medical devices that record the heart's electrical activity. Doctors most often [place] these monitors to diagnose arrhythmias. Arrhythmias are problems with the speed or rhythm of the heartbeat. The monitor is a small, portable device. You can wear one while you do your normal daily activities. This is usually used to diagnose what is causing palpitations/syncope (passing out).   Follow-Up: At [facility], you and your health needs are our priority.  As part of our continuing mission to provide you with exceptional heart  care, we have created designated Provider Care Teams.  These Care Teams include your primary Cardiologist (physician) and Advanced Practice Providers (APPs -  Physician Assistants and Nurse Practitioners) who all work together to provide you with the care you need, when you need it.  Follow-Up with Dr. Radford Pax as needed, based on results from tests.   Other Instructions  Your cardiac CT will be scheduled at:   [facility] [address] [place], [place] [address] [phone]  Please arrive at the [facility] main entrance of [facility] 30-45 minutes prior to test start time. Proceed to the [facility] Radiology Department (first floor) to check-in and test prep.  Please follow these instructions carefully (unless otherwise directed):  On the Night Before the Test: . Be sure to Drink plenty of water. . Do not consume any caffeinated/decaffeinated beverages or chocolate 12 hours prior to your test. . Do not take any antihistamines 12 hours prior to your test.  On the Day of the Test: . Drink plenty of water. Do not drink any water within one hour of the test. . Do not eat any food 4 hours prior to the test. . You may take your regular medications prior to the test.  . Take metoprolol (Lopressor) two hours prior to test. . FEMALES- please wear underwire-free bra if available       After the Test: . Drink plenty of water. . After receiving IV contrast, you may experience a mild flushed feeling. This is normal. . On occasion, you may experience a mild rash up to 24 hours after the test. This is not  dangerous. If this occurs, you can take Benadryl 25 mg and increase your fluid intake. . If you experience trouble breathing, this can be serious. If it is severe call 911 IMMEDIATELY. If it is mild, please call our office. . If you take any of these medications: Glipizide/Metformin, Avandament, Glucavance, please do not take 48 hours after completing test unless  otherwise instructed.   Once we have confirmed authorization from your insurance company, we will call you to set up a date and time for your test.   For non-scheduling related questions, please contact the cardiac imaging nurse navigator should you have any questions/concerns: Rockwell Alexandria, RN Navigator Cardiac Imaging [facility] [phone] Office       [employer] Cardiac Event Monitor Instructions Your physician has requested you wear your cardiac event monitor for _____ days, (1-30). [employer] may call or text to confirm a shipping address. The monitor will be sent to a land address via UPS. [employer] will not ship a monitor to a PO BOX. It typically takes 3-5 days to receive your monitor after it has been enrolled. [employer] will assist with USPS tracking if your package is delayed. The telephone number for [employer] is [phone]. Once you have received your monitor, please review the enclosed instructions. Instruction tutorials can also be viewed under help and settings on the enclosed cell phone. Your monitor has already been registered assigning a specific monitor serial # to you.  Applying the monitor Remove cell phone from case and turn it on. The cell phone works as IT consultant and needs to be within [employer] of you at all times. The cell phone will need to be charged on a daily basis. We recommend you plug the cell phone into the enclosed charger at your bedside table every night.  Monitor batteries: You will receive two monitor batteries labelled #1 and #2. These are your recorders. Plug battery #2 onto the second connection on the enclosed charger. Keep one battery on the charger at all times. This will keep the monitor battery deactivated. It will also keep it fully charged for when you need to switch your monitor batteries. A small light will be blinking on the battery emblem when it is charging. The light on the battery  emblem will remain on when the battery is fully charged.  Open package of a Monitor strip. Insert battery #1 into black hood on strip and gently squeeze monitor battery onto connection as indicated in instruction booklet. Set aside while preparing skin.  Choose location for your strip, vertical or horizontal, as indicated in the instruction booklet. Shave to remove all hair from location. There cannot be any lotions, oils, powders, or colognes on skin where monitor is to be applied. Wipe skin clean with enclosed Saline wipe. Dry skin completely.  Peel paper labeled #1 off the back of the Monitor strip exposing the adhesive. Place the monitor on the chest in the vertical or horizontal position shown in the instruction booklet. One arrow on the monitor strip must be pointing upward. Carefully remove paper labeled #2, attaching remainder of strip to your skin. Try not to create any folds or wrinkles in the strip as you apply it.  Firmly press and release the circle in the center of the monitor battery. You will hear a small beep. This is turning the monitor battery on. The heart emblem on the monitor battery will light up every 5 seconds if the monitor battery in turned on and connected to  the patient securely. Do not push and hold the circle down as this turns the monitor battery off. The cell phone will locate the monitor battery. A screen will appear on the cell phone checking the connection of your monitor strip. This may read poor connection initially but change to good connection within the next minute. Once your monitor accepts the connection you will hear a series of 3 beeps followed by a climbing crescendo of beeps. A screen will appear on the cell phone showing the two monitor strip placement options. Touch the picture that demonstrates where you applied the monitor strip.  Your monitor strip and battery are waterproof. You are able to shower, bathe, or swim with the monitor on. They  just ask you do not submerge deeper than 3 feet underwater. We recommend removing the monitor if you are swimming in a lake, river, or ocean.  Your monitor battery will need to be switched to a fully charged monitor battery approximately once a week. The cell phone will alert you of an action which needs to be made.  On the cell phone, tap for details to reveal connection status, monitor battery status, and cell phone battery status. The green dots indicates your monitor is in good status. A red dot indicates there is something that needs your attention.  To record a symptom, click the circle on the monitor battery. In 30-60 seconds a list of symptoms will appear on the cell phone. Select your symptom and tap save. Your monitor will record a sustained or significant arrhythmia regardless of you clicking the button. Some patients do not feel the heart rhythm irregularities. [employer] will notify us of any serious or critical events.  Refer to instruction booklet for instructions on switching batteries, changing strips, the Do not disturb or Pause features, or any additional questions.  Call [employer] at [phone], to confirm your monitor is transmitting and record your baseline. They will answer any questions you may have regarding the monitor instructions at that time.  Returning the monitor to [employer] Place all equipment back into blue box. Peel off strip of paper to expose adhesive and close box securely. There is a prepaid UPS shipping label on this box. Drop in a UPS drop box, or at a UPS facility like [employer]. You may also contact [employer] to arrange UPS to pick up monitor package at your home.

## 2019-05-28 ENCOUNTER — Ambulatory Visit (HOSPITAL_COMMUNITY)
Admission: RE | Admit: 2019-05-28 | Discharge: 2019-05-28 | Disposition: A | Discharge status: Home / Self Care | Payer: Medicare Other | Source: Ambulatory Visit | Attending: Cardiology | Admitting: Cardiology

## 2019-05-28 DIAGNOSIS — E7800 Pure hypercholesterolemia, unspecified: Secondary | ICD-10-CM | POA: Insufficient documentation

## 2019-05-28 DIAGNOSIS — I10 Essential (primary) hypertension: Secondary | ICD-10-CM | POA: Insufficient documentation

## 2019-05-28 DIAGNOSIS — R0602 Shortness of breath: Secondary | ICD-10-CM | POA: Diagnosis not present

## 2019-05-28 DIAGNOSIS — R42 Dizziness and giddiness: Secondary | ICD-10-CM | POA: Insufficient documentation

## 2019-05-28 NOTE — Progress Notes (Signed)
*  PRELIMINARY RESULTS* Echocardiogram 2D Echocardiogram has been performed.  Stacey Drain 05/28/2019, 11:40 AM

## 2019-05-29 ENCOUNTER — Telehealth: Payer: Self-pay

## 2019-05-29 DIAGNOSIS — R0602 Shortness of breath: Secondary | ICD-10-CM

## 2019-05-29 NOTE — Telephone Encounter (Signed)
-----   Message from Quintella Reichert, MD sent at 05/28/2019  7:51 PM EST ----- Please have patient come in for BNP due to SOB

## 2019-05-29 NOTE — Telephone Encounter (Signed)
The patient has been notified of the result and verbalized understanding.  All questions (if any) were answered. Theresia Majors, RN 05/29/2019 4:47 PM

## 2019-05-31 ENCOUNTER — Encounter (INDEPENDENT_AMBULATORY_CARE_PROVIDER_SITE_OTHER): Payer: Medicare Other

## 2019-05-31 DIAGNOSIS — E7800 Pure hypercholesterolemia, unspecified: Secondary | ICD-10-CM

## 2019-05-31 DIAGNOSIS — R42 Dizziness and giddiness: Secondary | ICD-10-CM | POA: Diagnosis not present

## 2019-05-31 DIAGNOSIS — I10 Essential (primary) hypertension: Secondary | ICD-10-CM

## 2019-05-31 DIAGNOSIS — R0602 Shortness of breath: Secondary | ICD-10-CM

## 2019-06-04 ENCOUNTER — Other Ambulatory Visit: Payer: Medicare Other

## 2019-06-04 ENCOUNTER — Other Ambulatory Visit: Payer: Self-pay

## 2019-06-04 DIAGNOSIS — R0602 Shortness of breath: Secondary | ICD-10-CM

## 2019-06-05 LAB — PRO B NATRIURETIC PEPTIDE: NT-Pro BNP: 73 pg/mL (ref 0–301)

## 2019-06-06 NOTE — Addendum Note (Signed)
Addended by: Theresia Majors on: 06/06/2019 12:21 PM   Modules accepted: Orders

## 2019-06-20 ENCOUNTER — Ambulatory Visit (AMBULATORY_SURGERY_CENTER): Payer: Medicare Other | Admitting: *Deleted

## 2019-06-20 ENCOUNTER — Other Ambulatory Visit: Payer: Self-pay

## 2019-06-20 VITALS — Ht 63.0 in | Wt 200.0 lb

## 2019-06-20 DIAGNOSIS — Z1211 Encounter for screening for malignant neoplasm of colon: Secondary | ICD-10-CM

## 2019-06-20 MED ORDER — NA SULFATE-K SULFATE-MG SULF 17.5-3.13-1.6 GM/177ML PO SOLN: 1.0000 | Freq: Once | ORAL | 0 refills | Status: AC

## 2019-06-20 NOTE — Progress Notes (Signed)
No egg or soy allergy known to patient  No issues with past sedation with any surgeries  or procedures, no intubation problems  No diet pills per patient No home 02 use per patient  No blood thinners per patient  Pt denies issues with constipation  No A fib or A flutter  EMMI video sent to pt's e mail   Due to the COVID-19 pandemic we are asking patients to follow these guidelines. Please only bring one care partner. Please be aware that your care partner may wait in the car in the parking lot or if they feel like they will be too hot to wait in the car, they may wait in the lobby on the 4th floor. All care partners are required to wear a mask the entire time (we do not have any that we can provide them), they need to practice social distancing, and we will do a Covid check for all patient's and care partners when you arrive. Also we will check their temperature and your temperature. If the care partner waits in their car they need to stay in the parking lot the entire time and we will call them on their cell phone when the patient is ready for discharge so they can bring the car to the front of the building. Also all patient's will need to wear a mask into building. Pt verified name, DOB, address and insurance during PV today. Pt mailed instruction packet to included paper to complete and mail back to [facility] with addressed and stamped envelope, Emmi video, copy of consent form to read and not return, and instructions.. PV completed over the phone. Pt encouraged to call with questions or issues   

## 2019-06-25 ENCOUNTER — Other Ambulatory Visit: Payer: Self-pay | Admitting: Family Medicine

## 2019-06-25 NOTE — Telephone Encounter (Signed)
Should have enough medication thru 07/29/19. Requesting refill too soon.

## 2019-06-28 ENCOUNTER — Ambulatory Visit
Admission: RE | Admit: 2019-06-28 | Discharge: 2019-06-28 | Disposition: A | Discharge status: Home / Self Care | Payer: Medicare Other | Source: Ambulatory Visit | Attending: Family Medicine | Admitting: Family Medicine

## 2019-06-28 ENCOUNTER — Other Ambulatory Visit: Payer: Medicare HMO

## 2019-06-28 ENCOUNTER — Other Ambulatory Visit: Payer: Self-pay

## 2019-06-28 DIAGNOSIS — Z1231 Encounter for screening mammogram for malignant neoplasm of breast: Secondary | ICD-10-CM

## 2019-06-29 ENCOUNTER — Telehealth (HOSPITAL_COMMUNITY): Payer: Self-pay | Admitting: Emergency Medicine

## 2019-06-29 ENCOUNTER — Other Ambulatory Visit: Payer: Self-pay | Admitting: Family Medicine

## 2019-06-29 ENCOUNTER — Encounter (HOSPITAL_COMMUNITY): Payer: Self-pay

## 2019-06-29 NOTE — Telephone Encounter (Signed)
Reaching out to patient to offer assistance regarding upcoming cardiac imaging study; pt verbalizes understanding of appt date/time, parking situation and where to check in, pre-test NPO status and medications ordered, and verified current allergies; name and call back number provided for further questions should they arise Rockwell Alexandria RN Navigator Cardiac Imaging [facility] [phone] office [phone] cell  Explained to patient new appt for IV hydration in medical day at 12 noon to protect her kidneys from contrast. Pt verbalized understanding to bring PO metoprolol with her as she will need to take it while there in preparation for CT scan.   Encouraged return phone call if she has questions.

## 2019-06-29 NOTE — Telephone Encounter (Signed)
Notes to clinic:  This Rx is not able to be delegated.    Requested Prescriptions  Pending Prescriptions Disp Refills   QUEtiapine (SEROQUEL) 50 MG tablet [Pharmacy Med Name: QUETIAPINE FUMARATE 50 MG Tablet] 90 tablet 0    Sig: TAKE 1 TABLET AT BEDTIME AS NEEDED      Not Delegated - Psychiatry:  Antipsychotics - Second Generation (Atypical) - quetiapine Failed - 06/29/2019  3:44 AM      Failed - This refill cannot be delegated      Passed - ALT in normal range and within 180 days    ALT  Date Value Ref Range Status  05/15/2019 22 0 - 32 IU/L Final          Passed - AST in normal range and within 180 days    AST  Date Value Ref Range Status  05/15/2019 18 0 - 40 IU/L Final          Passed - Completed PHQ-2 or PHQ-9 in the last 360 days.      Passed - Last BP in normal range    BP Readings from Last 1 Encounters:  05/22/19 108/66          Passed - Valid encounter within last 6 months    Recent Outpatient Visits           1 month ago Dizziness   [facility], Meda Coffee, MD   1 month ago Medicare annual wellness visit, subsequent   [facility] at [facility], Manus Rudd, MD   4 months ago Acquired hypothyroidism   [facility], Meda Coffee, MD   7 months ago Moderate episode of recurrent major depressive disorder [facility])   [facility], Meda Coffee, MD   8 months ago Other specified hypothyroidism   [facility], Meda Coffee, MD       Future Appointments             In 2 weeks Myles Lipps, MD [facility] at [place], [facility]             Signed Prescriptions Disp Refills   hydrOXYzine (ATARAX/VISTARIL) 50 MG tablet 360 tablet 2    Sig: TAKE 1 TABLET EVERY 6 HOURS AS NEEDED      Ear, Nose, and Throat:  Antihistamines Passed - 06/29/2019  3:44 AM      Passed - Valid encounter within last 12 months    Recent Outpatient Visits           1 month ago Dizziness   [facility], Meda Coffee, MD   1 month ago Medicare annual wellness visit, subsequent   Primary Care at [facility], Manus Rudd, MD   4 months ago Acquired hypothyroidism   [facility], Meda Coffee, MD   7 months ago Moderate episode of recurrent major depressive disorder [facility])   [facility], Meda Coffee, MD   8 months ago Other specified hypothyroidism   [facility], Meda Coffee, MD       Future Appointments             In 2 weeks Myles Lipps, MD [facility] at [place], [facility]

## 2019-07-02 ENCOUNTER — Other Ambulatory Visit: Payer: Self-pay | Admitting: Family Medicine

## 2019-07-02 ENCOUNTER — Ambulatory Visit (HOSPITAL_COMMUNITY)
Admission: RE | Admit: 2019-07-02 | Discharge: 2019-07-02 | Disposition: A | Discharge status: Home / Self Care | Payer: Medicare Other | Source: Ambulatory Visit | Attending: Cardiology | Admitting: Cardiology

## 2019-07-02 ENCOUNTER — Ambulatory Visit (HOSPITAL_COMMUNITY)
Admission: RE | Admit: 2019-07-02 | Discharge: 2019-07-02 | Disposition: A | Discharge status: Home / Self Care | Payer: Medicare Other | Source: Ambulatory Visit | Attending: Cardiovascular Disease | Admitting: Cardiovascular Disease

## 2019-07-02 ENCOUNTER — Other Ambulatory Visit: Payer: Self-pay

## 2019-07-02 DIAGNOSIS — I5032 Chronic diastolic (congestive) heart failure: Secondary | ICD-10-CM | POA: Diagnosis not present

## 2019-07-02 DIAGNOSIS — R0602 Shortness of breath: Secondary | ICD-10-CM | POA: Diagnosis not present

## 2019-07-02 DIAGNOSIS — R928 Other abnormal and inconclusive findings on diagnostic imaging of breast: Secondary | ICD-10-CM

## 2019-07-02 LAB — BASIC METABOLIC PANEL
Anion gap: 13 (ref 5–15)
BUN: 10 mg/dL (ref 8–23)
CO2: 23 mmol/L (ref 22–32)
Calcium: 9.2 mg/dL (ref 8.9–10.3)
Chloride: 102 mmol/L (ref 98–111)
Creatinine, Ser: 1.46 mg/dL — ABNORMAL HIGH (ref 0.44–1.00)
GFR calc Af Amer: 43 mL/min — ABNORMAL LOW (ref >60)
GFR calc non Af Amer: 37 mL/min — ABNORMAL LOW (ref >60)
Glucose, Bld: 138 mg/dL — ABNORMAL HIGH (ref 70–99)
Potassium: 4.1 mmol/L (ref 3.5–5.1)
Sodium: 138 mmol/L (ref 135–145)

## 2019-07-02 MED ORDER — IOHEXOL 350 MG/ML SOLN
80.0000 mL | Freq: Once | INTRAVENOUS | Status: AC | PRN
  Administered 2019-07-02: 80 mL via INTRAVENOUS

## 2019-07-02 MED ORDER — NITROGLYCERIN 0.4 MG SL SUBL
SUBLINGUAL_TABLET | SUBLINGUAL | Status: AC
  Administered 2019-07-02: 14:00:00 0.8 mg
  Filled 2019-07-02: qty 2

## 2019-07-02 MED ORDER — SODIUM CHLORIDE 0.9 % WEIGHT BASED INFUSION
3.0000 mL/kg/h | INTRAVENOUS | Status: AC
  Administered 2019-07-02: 3 mL/kg/h via INTRAVENOUS

## 2019-07-02 MED ORDER — SODIUM CHLORIDE 0.9 % WEIGHT BASED INFUSION: 1.0000 mL/kg/h | INTRAVENOUS | Status: DC

## 2019-07-02 MED ORDER — METOPROLOL TARTRATE 5 MG/5ML IV SOLN
INTRAVENOUS | Status: AC
  Administered 2019-07-02: 14:00:00 10 mg
  Filled 2019-07-02: qty 10

## 2019-07-03 ENCOUNTER — Other Ambulatory Visit: Payer: Self-pay | Admitting: Gastroenterology

## 2019-07-03 ENCOUNTER — Ambulatory Visit (INDEPENDENT_AMBULATORY_CARE_PROVIDER_SITE_OTHER): Payer: Medicare Other

## 2019-07-03 ENCOUNTER — Other Ambulatory Visit: Payer: Medicare Other

## 2019-07-03 DIAGNOSIS — Z1159 Encounter for screening for other viral diseases: Secondary | ICD-10-CM

## 2019-07-03 LAB — SARS CORONAVIRUS 2 (TAT 6-24 HRS): SARS Coronavirus 2: NEGATIVE

## 2019-07-04 ENCOUNTER — Ambulatory Visit
Admission: RE | Admit: 2019-07-04 | Discharge: 2019-07-04 | Disposition: A | Discharge status: Home / Self Care | Payer: Medicare Other | Source: Ambulatory Visit | Attending: Family Medicine | Admitting: Family Medicine

## 2019-07-04 ENCOUNTER — Telehealth: Payer: Self-pay | Admitting: Cardiology

## 2019-07-04 ENCOUNTER — Other Ambulatory Visit: Payer: Self-pay

## 2019-07-04 DIAGNOSIS — R928 Other abnormal and inconclusive findings on diagnostic imaging of breast: Secondary | ICD-10-CM

## 2019-07-04 DIAGNOSIS — E7800 Pure hypercholesterolemia, unspecified: Secondary | ICD-10-CM

## 2019-07-04 DIAGNOSIS — N6489 Other specified disorders of breast: Secondary | ICD-10-CM | POA: Diagnosis not present

## 2019-07-04 MED ORDER — ASPIRIN EC 81 MG PO TBEC: 81.0000 mg | DELAYED_RELEASE_TABLET | Freq: Every day | ORAL | 3 refills | Status: AC

## 2019-07-04 NOTE — Telephone Encounter (Signed)
Patient calling for CT results. 

## 2019-07-04 NOTE — Telephone Encounter (Signed)
The patient has been notified of the result and verbalized understanding.  All questions (if any) were answered. Patient will start on ASA 81 mg. She is aware that she needs to get into an exercise routine. Scheduled to follow up with Herma Carson, [name] on 03/23. Have also referred the patient to lipid clinic due to high triglycerides.  Theresia Majors, RN 07/04/2019 4:13 PM

## 2019-07-06 ENCOUNTER — Encounter: Payer: 59 | Admitting: Gastroenterology

## 2019-07-08 NOTE — Progress Notes (Unsigned)
Patient ID: [name]                 DOB: Mar 28, 1956                    MRN: [id number]     HPI: Jennifer Davidson is a 64 y.o. female patient referred to lipid clinic by Dr. Mayford Knife. PMH is significant for HTN, diastolic CHF with LVEF 60-65% in January 2021, and OSA on PAP therapy. Chest CT one week ago showed mild CAD in the mid LAD and normal coronary origin with left dominance. Coronary calcium score was 216 (89th percentile).  Started atorvastatin by PCP in October 2020 Referred for elevated TG - Vascepa?  Current Medications: atorvastatin 20 mg daily  Intolerances: fenofibrate 145 mg daily (2014), rosuvastatin 20 mg daily (2014)  Risk Factors: HTN, family history  LDL goal: <100 mg/dL  Diet:   Exercise:   Family History: The patient's family history includes heart disease in her father and mother. Her mother had a hx of CAD with CABG.  Social History:   Labs: 05/15/19: TC 153, TG 174, HDL 56, LDL 68 (atorvastatin 20mg  daily) 10/26/18: TC 275, TG 364, HDL 51, LDL 151  Past Medical History:  Diagnosis Date  . Acute encephalopathy 06/25/2016  . Anxiety   . Cataract 08/31/2017   left eye  . Cholesterol serum increased   . Chronic back pain    chronic Rt low back pain. s/p L4-5 fusion. failed Rt facet injections. poss due to Rt SI joint dysfunction.  . Chronic kidney disease   . Depression   . Diastolic heart failure (HCC)   . GERD (gastroesophageal reflux disease)   . Hyperlipidemia   . Hyperparathyroidism (HCC)   . Hypertension   . Hypothyroidism   . Migraine   . Neuromuscular disorder (HCC)   . Seizures (HCC)    3 years ago,06/20/19  . Sleep apnea    cpap  . Syncope 08/30/2015  . Tachycardia   . Trochanteric bursitis of both hips 2012   Confirmed on MRI    Current Outpatient Medications on File Prior to Visit  Medication Sig Dispense Refill  . aspirin EC 81 MG tablet Take 1 tablet (81 mg total) by mouth daily. 90 tablet 3  . atorvastatin (LIPITOR) 20 MG tablet  Take 1 tablet (20 mg total) by mouth daily. 90 tablet 3  . buPROPion (WELLBUTRIN XL) 150 MG 24 hr tablet TAKE 1 TABLET BY MOUTH EVERY DAY 90 tablet 1  . busPIRone (BUSPAR) 5 MG tablet TAKE 1 TABLET THREE TIMES DAILY. MAY TAKE 4TH DOSE IN EVENING FOR ANXIETY EXACERBATION 360 tablet 1  . diclofenac sodium (VOLTAREN) 1 % GEL Apply 2 g topically daily as needed (for knee pain). 100 g 3  . DULoxetine (CYMBALTA) 60 MG capsule TAKE 1 CAPSULE EVERY DAY 90 capsule 1  . esomeprazole (NEXIUM) 40 MG capsule TAKE 1 CAPSULE EVERY DAY 90 capsule 1  . fluticasone (FLONASE) 50 MCG/ACT nasal spray PLACE 2 SPRAYS AT BEDTIME INTO BOTH NOSTRILS. (Patient taking differently: Place 1 spray into both nostrils 2 (two) times a day. ) 16 g 5  . gabapentin (NEURONTIN) 600 MG tablet TAKE 1 TABLET THREE TIMES DAILY 270 tablet 1  . hydrOXYzine (ATARAX/VISTARIL) 50 MG tablet TAKE 1 TABLET EVERY 6 HOURS AS NEEDED 360 tablet 2  . levothyroxine (SYNTHROID) 50 MCG tablet Take 1 tablet (50 mcg total) by mouth every other day. Before breakfast, alternated with 2013 tablet.  90 tablet 1  . levothyroxine (SYNTHROID) 75 MCG tablet Take 1 tablet (75 mcg total) by mouth every other day. Before breakfast, alternate with 63mcg tablet. 90 tablet 1  . meclizine (ANTIVERT) 25 MG tablet TAKE 1 TABLET BY MOUTH 3 TIMES DAILY AS NEEDED FOR DIZZINESS (Patient not taking: Reported on 06/20/2019) 30 tablet 0  . Melatonin 10 MG TABS Take 1 tablet by mouth daily.    . metoprolol tartrate (LOPRESSOR) 100 MG tablet Take 1 tablet (100 mg total) by mouth once for 1 dose. Take 1 tablet (100 mg) 2 hours prior to CT scan 1 tablet 0  . ondansetron (ZOFRAN-ODT) 8 MG disintegrating tablet Take 0.5-1 tablets (4-8 mg total) by mouth every 8 (eight) hours as needed for nausea (from migraines). 20 tablet 5  . prazosin (MINIPRESS) 2 MG capsule TAKE 2 TABLETS BY MOUTH ALONG WITH THE 5MG  TABLET TO MAKE A TOTAL OF 9MG  DOSE AT BEDTIME 180 capsule 0  . prazosin (MINIPRESS) 5 MG  capsule Take 2 capsules (10 mg total) by mouth at bedtime. TAKE 2 TO 3 CAPSULES AT BEDTIME 180 capsule 0  . QUEtiapine (SEROQUEL) 300 MG tablet TAKE 1 TABLET BY MOUTH EVERYDAY AT BEDTIME 90 tablet 1  . QUEtiapine (SEROQUEL) 50 MG tablet TAKE 1 TABLET AT BEDTIME AS NEEDED 90 tablet 0  . rizatriptan (MAXALT-MLT) 10 MG disintegrating tablet Take 1 tablet (10 mg total) by mouth as needed for migraine. May repeat in 2 hours if needed 30 tablet 3   No current facility-administered medications on file prior to visit.    Allergies  Allergen Reactions  . Tramadol Nausea And Vomiting  . Trazodone And Nefazodone Other (See Comments)    Per pt trazodone caused hallucinations and behavior changes     Assessment/Plan:  1. Hyperlipidemia -

## 2019-07-09 ENCOUNTER — Ambulatory Visit: Payer: Medicare Other

## 2019-07-10 ENCOUNTER — Other Ambulatory Visit: Payer: Self-pay

## 2019-07-10 ENCOUNTER — Ambulatory Visit (AMBULATORY_SURGERY_CENTER): Payer: Medicare Other | Admitting: Gastroenterology

## 2019-07-10 ENCOUNTER — Encounter: Payer: Self-pay | Admitting: Gastroenterology

## 2019-07-10 VITALS — BP 149/82 | HR 85 | Temp 97.7°F | Resp 18 | Ht 63.0 in | Wt 200.0 lb

## 2019-07-10 DIAGNOSIS — Z1211 Encounter for screening for malignant neoplasm of colon: Secondary | ICD-10-CM | POA: Diagnosis not present

## 2019-07-10 MED ORDER — SODIUM CHLORIDE 0.9 % IV SOLN: 500.0000 mL | Freq: Once | INTRAVENOUS | Status: DC

## 2019-07-10 NOTE — Op Note (Signed)
[facility] Patient Name: Jennifer Davidson Procedure Date: 07/10/2019 11:25 AM MRN: [id number] Endoscopist: Sherilyn Cooter L. Myrtie Neither , MD Age: 64 Referring MD:  Date of Birth: [birth year] Gender: Female Account #: [id number] Procedure:                Colonoscopy Indications:              Screening for colorectal malignant neoplasm                            (patient reported last colonoscopy approximately 15                            years ago) Medicines:                Monitored Anesthesia Care Procedure:                Pre-Anesthesia Assessment:                           - Prior to the procedure, a History and Physical                            was performed, and patient medications and                            allergies were reviewed. The patient's tolerance of                            previous anesthesia was also reviewed. The risks                            and benefits of the procedure and the sedation                            options and risks were discussed with the patient.                            All questions were answered, and informed consent                            was obtained. Prior Anticoagulants: The patient has                            taken no previous anticoagulant or antiplatelet                            agents. ASA Grade Assessment: III - A patient with                            severe systemic disease. After reviewing the risks                            and benefits, the patient was deemed in  satisfactory condition to undergo the procedure.                           After obtaining informed consent, the colonoscope                            was passed under direct vision. Throughout the                            procedure, the patient's blood pressure, pulse, and                            oxygen saturations were monitored continuously. The                            Colonoscope was introduced through the anus and                         advanced to the the cecum, identified by                            appendiceal orifice and ileocecal valve. The                            colonoscopy was performed with difficulty due to                            multiple diverticula in the colon, a redundant                            colon and a tortuous colon. Successful completion                            of the procedure was aided by using manual                            pressure. The patient tolerated the procedure well.                            The quality of the bowel preparation was good after                            lavage. The ileocecal valve, appendiceal orifice,                            and rectum were photographed. Scope In: 11:38:17 AM Scope Out: 12:02:10 PM Scope Withdrawal Time: 0 hours 11 minutes 40 seconds  Total Procedure Duration: 0 hours 23 minutes 53 seconds  Findings:                 The perianal and digital rectal examinations were                            normal.  Multiple diverticula were found in the left colon                            and right colon.                           The exam was otherwise without abnormality on                            direct and retroflexion views. There was associated                            tortuosity and redundancy. Complications:            No immediate complications. Estimated Blood Loss:     Estimated blood loss: none. Impression:               - Diverticulosis in the left colon and in the right                            colon.                           - The examination was otherwise normal on direct                            and retroflexion views.                           - No specimens collected. Recommendation:           - Patient has a contact number available for                            emergencies. The signs and symptoms of potential                            delayed complications were  discussed with the                            patient. Return to normal activities tomorrow.                            Written discharge instructions were provided to the                            patient.                           - Resume previous diet.                           - Continue present medications.                           - Repeat colonoscopy in 10 years for screening  purposes. Jennifer Davidson L. Myrtie Neither, MD 07/10/2019 12:07:54 PM This report has been signed electronically.

## 2019-07-10 NOTE — Progress Notes (Signed)
Vitals-DT Temp-LC  Pt's states no medical or surgical changes since previsit or office visit. 

## 2019-07-10 NOTE — Patient Instructions (Signed)
Please, read all of the handouts given to you by your recovery room nurse.  Thank-you for choosing us for your healthcare needs today.  YOU HAD AN ENDOSCOPIC PROCEDURE TODAY AT THE [facility]:   Refer to the procedure report that was given to you for any specific questions about what was found during the examination.  If the procedure report does not answer your questions, please call your gastroenterologist to clarify.  If you requested that your care partner not be given the details of your procedure findings, then the procedure report has been included in a sealed envelope for you to review at your convenience later.  YOU SHOULD EXPECT: Some feelings of bloating in the abdomen. Passage of more gas than usual.  Walking can help get rid of the air that was put into your GI tract during the procedure and reduce the bloating. If you had a lower endoscopy (such as a colonoscopy or flexible sigmoidoscopy) you may notice spotting of blood in your stool or on the toilet paper. If you underwent a bowel prep for your procedure, you may not have a normal bowel movement for a few days.  Please Note:  You might notice some irritation and congestion in your nose or some drainage.  This is from the oxygen used during your procedure.  There is no need for concern and it should clear up in a day or so.  SYMPTOMS TO REPORT IMMEDIATELY:   Following lower endoscopy (colonoscopy or flexible sigmoidoscopy):  Excessive amounts of blood in the stool  Significant tenderness or worsening of abdominal pains  Swelling of the abdomen that is new, acute  Fever of 100F or higher   For urgent or emergent issues, a gastroenterologist can be reached at any hour by calling [phone].   DIET:  We do recommend a small meal at first, but then you may proceed to your regular diet.  Drink plenty of fluids but you should avoid alcoholic beverages for 24 hours. Try to increase the fiber in your diet, and drink  plenty of water.  ACTIVITY:  You should plan to take it easy for the rest of today and you should NOT DRIVE or use heavy machinery until tomorrow (because of the sedation medicines used during the test).    FOLLOW UP: Our staff will call the number listed on your records 48-72 hours following your procedure to check on you and address any questions or concerns that you may have regarding the information given to you following your procedure. If we do not reach you, we will leave a message.  We will attempt to reach you two times.  During this call, we will ask if you have developed any symptoms of COVID 19. If you develop any symptoms (ie: fever, flu-like symptoms, shortness of breath, cough etc.) before then, please call [phone].  If you test positive for Covid 19 in the 2 weeks post procedure, please call and report this information to us.     SIGNATURES/CONFIDENTIALITY: You and/or your care partner have signed paperwork which will be entered into your electronic medical record.  These signatures attest to the fact that that the information above on your After Visit Summary has been reviewed and is understood.  Full responsibility of the confidentiality of this discharge information lies with you and/or your care-partner. 

## 2019-07-10 NOTE — Progress Notes (Signed)
To PACU, VSS. Report to [email or web address] 

## 2019-07-11 ENCOUNTER — Other Ambulatory Visit: Payer: Self-pay | Admitting: Family Medicine

## 2019-07-12 ENCOUNTER — Telehealth: Payer: Self-pay

## 2019-07-12 NOTE — Telephone Encounter (Signed)
  Follow up Call-  Call back number 07/10/2019 09/13/2017  Post procedure Call Back phone  # [phone]  Permission to leave phone message Yes Yes  Some recent data might be hidden     Patient questions:  Do you have a fever, pain , or abdominal swelling? No. Pain Score  0 *  Have you tolerated food without any problems? Yes.    Have you been able to return to your normal activities? Yes.    Do you have any questions about your discharge instructions: Diet   No. Medications  No. Follow up visit  No.  Do you have questions or concerns about your Care? No.  Actions: * If pain score is 4 or above: No action needed, pain <4.  1. Have you developed a fever since your procedure? No  2.   Have you had an respiratory symptoms (SOB or cough) since your procedure? No 3.   Have you tested positive for COVID 19 since your procedure No 4.   Have you had any family members/close contacts diagnosed with the COVID 19 since your procedure?  No  If yes to any of these questions please route to Laverna Peace, RN and Jennye Boroughs, RN.

## 2019-07-16 ENCOUNTER — Ambulatory Visit: Payer: Medicare Other

## 2019-07-16 ENCOUNTER — Other Ambulatory Visit: Payer: Self-pay

## 2019-07-16 DIAGNOSIS — E039 Hypothyroidism, unspecified: Secondary | ICD-10-CM | POA: Diagnosis not present

## 2019-07-16 DIAGNOSIS — R197 Diarrhea, unspecified: Secondary | ICD-10-CM | POA: Diagnosis not present

## 2019-07-16 DIAGNOSIS — I5030 Unspecified diastolic (congestive) heart failure: Secondary | ICD-10-CM | POA: Diagnosis not present

## 2019-07-16 DIAGNOSIS — G4733 Obstructive sleep apnea (adult) (pediatric): Secondary | ICD-10-CM | POA: Diagnosis not present

## 2019-07-16 DIAGNOSIS — E785 Hyperlipidemia, unspecified: Secondary | ICD-10-CM | POA: Diagnosis not present

## 2019-07-16 DIAGNOSIS — E038 Other specified hypothyroidism: Secondary | ICD-10-CM | POA: Diagnosis not present

## 2019-07-16 DIAGNOSIS — I129 Hypertensive chronic kidney disease with stage 1 through stage 4 chronic kidney disease, or unspecified chronic kidney disease: Secondary | ICD-10-CM | POA: Diagnosis not present

## 2019-07-16 DIAGNOSIS — G709 Myoneural disorder, unspecified: Secondary | ICD-10-CM | POA: Diagnosis not present

## 2019-07-16 DIAGNOSIS — Z9989 Dependence on other enabling machines and devices: Secondary | ICD-10-CM | POA: Diagnosis not present

## 2019-07-16 DIAGNOSIS — N1831 Chronic kidney disease, stage 3a: Secondary | ICD-10-CM | POA: Diagnosis not present

## 2019-07-17 LAB — TSH: TSH: 2.88 u[IU]/mL (ref 0.450–4.500)

## 2019-07-19 ENCOUNTER — Other Ambulatory Visit: Payer: Self-pay

## 2019-07-19 ENCOUNTER — Ambulatory Visit (INDEPENDENT_AMBULATORY_CARE_PROVIDER_SITE_OTHER): Payer: Medicare Other | Admitting: Family Medicine

## 2019-07-19 ENCOUNTER — Encounter: Payer: Self-pay | Admitting: Family Medicine

## 2019-07-19 VITALS — BP 112/84 | HR 107 | Temp 97.8°F | Ht 63.0 in | Wt 214.6 lb

## 2019-07-19 DIAGNOSIS — F331 Major depressive disorder, recurrent, moderate: Secondary | ICD-10-CM

## 2019-07-19 DIAGNOSIS — E782 Mixed hyperlipidemia: Secondary | ICD-10-CM | POA: Diagnosis not present

## 2019-07-19 DIAGNOSIS — E038 Other specified hypothyroidism: Secondary | ICD-10-CM

## 2019-07-19 DIAGNOSIS — F411 Generalized anxiety disorder: Secondary | ICD-10-CM | POA: Diagnosis not present

## 2019-07-19 MED ORDER — BUSPIRONE HCL 7.5 MG PO TABS: 7.5000 mg | ORAL_TABLET | Freq: Three times a day (TID) | ORAL | 3 refills | Status: DC

## 2019-07-19 MED ORDER — GABAPENTIN 600 MG PO TABS: 600.0000 mg | ORAL_TABLET | Freq: Three times a day (TID) | ORAL | 1 refills | Status: DC

## 2019-07-19 NOTE — Progress Notes (Signed)
3/4/20219:42 AM  Jennifer Davidson 05/06/1956, 64 y.o., female [id number]  Chief Complaint  Patient presents with  . Follow-up    dizziness has gotten better. Went to cards 2 wks ago, told she has murmur    HPI:   Patient is a 64 y.o. female with past medical history significant for hypothyroidism, anxiety, depression, HLP, prediabetes,OSA on cpapwho presents today forfollowup  Last OV Jan 2021 Referred to cards - holter - occ PAC and PVCs, echo LVEF wnl, mild LVH, G2DD, trivial MR, CT coronary - mild nonobstructive CAD, referred to lipid clinic due to TG Decreased levothyroxine  Lab Results  Component Value Date   TSH 2.880 07/16/2019   Lab Results  Component Value Date   CHOL 153 05/15/2019   HDL 56 05/15/2019   LDLCALC 68 05/15/2019   TRIG 174 (H) 05/15/2019   CHOLHDL 2.7 05/15/2019    She reports that she is doing well with new levothyroxine dosing unfortunately it did not help with her jitteriness  She reports that dizziness is much better but her anxiety and depression are getting sign worse She denies any SI She is having increase in vivid dreams - her husband reports talking and arms movements in her sleep   Depression screen [facility] 2/9 07/19/2019 05/21/2019 05/16/2019  Decreased Interest 0 0 0  Down, Depressed, Hopeless 0 0 0  PHQ - 2 Score 0 0 0  Altered sleeping - - -  Tired, decreased energy - - -  Change in appetite - - -  Feeling bad or failure about yourself  - - -  Trouble concentrating - - -  Moving slowly or fidgety/restless - - -  Suicidal thoughts - - -  PHQ-9 Score - - -  Difficult doing work/chores - - -  Some recent data might be hidden    Fall Risk  07/19/2019 05/21/2019 05/16/2019 02/19/2019 11/20/2018  Falls in the past year? 0 1 1 0 0  Comment - - loose balance / did not go to hospital - -  Number falls in past yr: 0 1 1 0 0  Injury with Fall? 0 1 1 0 0  Comment - rt leg, lt arm and lt eye - - -  Risk Factor Category  - - - - -  Risk for fall  due to : - History of fall(s);Impaired balance/gait History of fall(s) - -  Risk for fall due to: Comment - - - - -  Follow up - Falls evaluation completed Falls evaluation completed;Education provided - -     Allergies  Allergen Reactions  . Tramadol Nausea And Vomiting  . Trazodone And Nefazodone Other (See Comments)    Per pt trazodone caused hallucinations and behavior changes     Prior to Admission medications   Medication Sig Start Date End Date Taking? Authorizing Provider  aspirin EC 81 MG tablet Take 1 tablet (81 mg total) by mouth daily. 07/04/19  Yes Turner, Cornelious Bryant, MD  atorvastatin (LIPITOR) 20 MG tablet Take 1 tablet (20 mg total) by mouth daily. 02/19/19  Yes Myles Lipps, MD  buPROPion (WELLBUTRIN XL) 150 MG 24 hr tablet TAKE 1 TABLET BY MOUTH EVERY DAY 04/26/19  Yes Myles Lipps, MD  busPIRone (BUSPAR) 5 MG tablet TAKE 1 TABLET THREE TIMES DAILY. MAY TAKE 4TH DOSE IN EVENING FOR ANXIETY EXACERBATION 03/30/19  Yes Myles Lipps, MD  diclofenac sodium (VOLTAREN) 1 % GEL Apply 2 g topically daily as needed (for knee pain). 08/23/18  Yes Myles Lipps, MD  DULoxetine (CYMBALTA) 60 MG capsule TAKE 1 CAPSULE EVERY DAY 04/11/19  Yes Myles Lipps, MD  esomeprazole (NEXIUM) 40 MG capsule TAKE 1 CAPSULE EVERY DAY 04/11/19  Yes Myles Lipps, MD  fluticasone (FLONASE) 50 MCG/ACT nasal spray PLACE 2 SPRAYS AT BEDTIME INTO BOTH NOSTRILS. Patient taking differently: Place 1 spray into both nostrils 2 (two) times a day.  08/23/18  Yes Myles Lipps, MD  gabapentin (NEURONTIN) 600 MG tablet TAKE 1 TABLET THREE TIMES DAILY 03/30/19  Yes Myles Lipps, MD  hydrOXYzine (ATARAX/VISTARIL) 50 MG tablet TAKE 1 TABLET EVERY 6 HOURS AS NEEDED 06/29/19  Yes Myles Lipps, MD  levothyroxine (SYNTHROID) 50 MCG tablet Take 1 tablet (50 mcg total) by mouth every other day. Before breakfast, alternated with tablet. 05/21/19  Yes Myles Lipps, MD  levothyroxine (SYNTHROID)  75 MCG tablet TAKE 1 TABLET (75 MCG TOTAL) BY MOUTH DAILY BEFORE BREAKFAST. 07/11/19  Yes Myles Lipps, MD  Melatonin 10 MG TABS Take 1 tablet by mouth daily.   Yes [provider]  ondansetron (ZOFRAN-ODT) 8 MG disintegrating tablet Take 0.5-1 tablets (4-8 mg total) by mouth every 8 (eight) hours as needed for nausea (from migraines). 11/20/18  Yes Myles Lipps, MD  prazosin (MINIPRESS) 2 MG capsule TAKE 2 TABLETS BY MOUTH ALONG WITH THE 5MG  TABLET TO MAKE A TOTAL OF 9MG  DOSE AT BEDTIME 07/11/19  Yes , MD  prazosin (MINIPRESS) 5 MG capsule TAKE 2 TO 3 CAPSULES AT BEDTIME 07/11/19  Yes Myles Lipps, MD  QUEtiapine (SEROQUEL) 300 MG tablet TAKE 1 TABLET BY MOUTH EVERYDAY AT BEDTIME 04/26/19  Yes Myles Lipps, MD  QUEtiapine (SEROQUEL) 50 MG tablet TAKE 1 TABLET AT BEDTIME AS NEEDED 06/29/19  Yes Myles Lipps, MD  rizatriptan (MAXALT-MLT) 10 MG disintegrating tablet Take 1 tablet (10 mg total) by mouth as needed for migraine. May repeat in 2 hours if needed 10/14/17  Yes Myles Lipps, MD    Past Medical History:  Diagnosis Date  . Acute encephalopathy 06/25/2016  . Anxiety   . Cataract 08/31/2017   left eye  . Cholesterol serum increased   . Chronic back pain    chronic Rt low back pain. s/p L4-5 fusion. failed Rt facet injections. poss due to Rt SI joint dysfunction.  . Chronic kidney disease   . Depression   . Diastolic heart failure (HCC)   . GERD (gastroesophageal reflux disease)   . Hyperlipidemia   . Hyperparathyroidism (HCC)   . Hypertension   . Hypothyroidism   . Migraine   . Neuromuscular disorder (HCC)   . Seizures (HCC)    3 years ago,06/20/19  . Sleep apnea    cpap  . Syncope 08/30/2015  . Tachycardia   . Trochanteric bursitis of both hips 2012   Confirmed on MRI    Past Surgical History:  Procedure Laterality Date  . ABDOMINAL HYSTERECTOMY    . APPENDECTOMY  1986  . BACK SURGERY     L4-5 fusion  . CATARACT EXTRACTION    . KNEE  SURGERY    . SHOULDER ARTHROSCOPY WITH SUBACROMIAL DECOMPRESSION Left 10/20/2018   Procedure: SHOULDER ARTHROSCOPY, ROTATOR CUFF DEBRIDEMENT, GLENOHUMERAL JOINT DEBRIDEMENT, ACROMIOPLASTY, DISTAL CLAVICLE RESECTION;  Surgeon: 2013, MD;  Location: [facility];  Service: Orthopedics;  Laterality: Left;    Social History   Tobacco Use  . Smoking status: Never Smoker  . Smokeless tobacco: Never  Used  Substance Use Topics  . Alcohol use: No    Family History  Problem Relation Age of Onset  . Diabetes Mother   . Heart disease Mother   . Kidney disease Mother   . Thyroid disease Mother   . Heart disease Father   . Seizures Father   . COPD Father   . Irritable bowel syndrome Father   . Cancer Maternal Grandmother   . Diabetes Sister   . Diabetes Brother   . Colon cancer Neg Hx   . Stomach cancer Neg Hx   . Colon polyps Neg Hx   . Esophageal cancer Neg Hx   . Rectal cancer Neg Hx     Review of Systems  Constitutional: Positive for malaise/fatigue. Negative for chills and fever.  Respiratory: Positive for shortness of breath. Negative for cough.   Cardiovascular: Positive for palpitations. Negative for chest pain and leg swelling.  Gastrointestinal: Negative for abdominal pain, nausea and vomiting.  Psychiatric/Behavioral: Positive for depression. Negative for suicidal ideas. The patient is nervous/anxious.      OBJECTIVE:  Today's Vitals   07/19/19 0921  BP: 112/84  Pulse: (!) 107  Temp: 97.8 F (36.6 C)  SpO2: 97%  Weight: 214 lb 9.6 oz (97.3 kg)  Height: 5\' 3"  (1.6 m)   Body mass index is 38.01 kg/m.   Physical Exam Vitals and nursing note reviewed.  Constitutional:      Appearance: She is well-developed.  HENT:     Head: Normocephalic and atraumatic.     Mouth/Throat:     Pharynx: No oropharyngeal exudate.  Eyes:     General: No scleral icterus.    Conjunctiva/sclera: Conjunctivae normal.     Pupils: Pupils are equal, round, and reactive to light.    Cardiovascular:     Rate and Rhythm: Normal rate and regular rhythm.     Heart sounds: Normal heart sounds. No murmur. No friction rub. No gallop.   Pulmonary:     Effort: Pulmonary effort is normal.     Breath sounds: Normal breath sounds. No wheezing or rales.  Musculoskeletal:     Cervical back: Neck supple.     Right lower leg: No edema.     Left lower leg: No edema.  Lymphadenopathy:     Cervical: No cervical adenopathy.  Skin:    General: Skin is warm and dry.  Neurological:     Mental Status: She is alert and oriented to person, place, and time.     No results found for this or any previous visit (from the past 24 hour(s)).  No results found.   ASSESSMENT and PLAN  1. Other specified hypothyroidism Controlled. Continue current regime.   2. Generalized anxiety disorder 3. Moderate episode of recurrent major depressive disorder ([place]) Not controlled despite multiple medications, increase buspar, referring to psych for further eval and treatment - Ambulatory referral to Psychiatry  4. Mixed hyperlipidemia Has been referred to lipid clinic by cards Other orders - gabapentin (NEURONTIN) 600 MG tablet; Take 1 tablet (600 mg total) by mouth 3 (three) times daily. - busPIRone (BUSPAR) 7.5 MG tablet; Take 1 tablet (7.5 mg total) by mouth 3 (three) times daily.  Return in about 6 months (around 01/19/2020).    Rutherford Guys, MD [facility] [place], [place] [address] Ph.  [phone] Fax [phone]

## 2019-07-19 NOTE — Patient Instructions (Signed)
° ° ° °  If you have lab work done today you will be contacted with your lab results within the next 2 weeks.  If you have not heard from us then please contact us. The fastest way to get your results is to register for My Chart. ° ° °IF you received an x-ray today, you will receive an invoice from [facility]. Please contact [facility] at [phone] with questions or concerns regarding your invoice.  ° °IF you received labwork today, you will receive an invoice from [facility]. Please contact [facility] at [phone] with questions or concerns regarding your invoice.  ° °Our billing staff will not be able to assist you with questions regarding bills from these companies. ° °You will be contacted with the lab results as soon as they are available. The fastest way to get your results is to activate your My Chart account. Instructions are located on the last page of this paperwork. If you have not heard from us regarding the results in 2 weeks, please contact this office. °  ° ° ° °

## 2019-07-25 NOTE — Progress Notes (Unsigned)
Patient ID: AMILIANA Davidson                 DOB: May 26, 1955                    MRN: [id number]     HPI: Jennifer Davidson is a 64 y.o. female patient referred to lipid clinic by Dr. Mayford Knife. PMH is significant for HTN, diastolic CHF with LVEF 60-65% in January 2021, and OSA on PAP therapy. Patient was last seen by Dr. Mayford Knife via telemedicine on 05/22/19 for dizziness. A chest CT was done on 07/02/19 and showed mild CAD in the mid LAD and normal coronary origin with left dominance. Coronary calcium score was 216 (89th percentile).  Started atorvastatin by PCP in October 2020 Referred for elevated TG - Vascepa?  Insurance: Medicare supp plan D, [employer]  Current Medications: atorvastatin 20 mg daily  Intolerances: fenofibrate 145 mg daily (2014), rosuvastatin 20 mg daily (2014)  Risk Factors: HTN, OSA, family history, positive calcium score  LDL goal: <77 mg/dL  Diet:   Exercise:   Family History: The patient's family history includes heart disease in her father and mother. Her mother had a hx of CAD with CABG.  Social History:   Labs: 05/15/19: TC 153, TG 174, HDL 56, LDL 68 (atorvastatin 20mg  daily) 10/26/18: TC 275, TG 364, HDL 51, LDL 151  Past Medical History:  Diagnosis Date  . Acute encephalopathy 06/25/2016  . Anxiety   . Cataract 08/31/2017   left eye  . Cholesterol serum increased   . Chronic back pain    chronic Rt low back pain. s/p L4-5 fusion. failed Rt facet injections. poss due to Rt SI joint dysfunction.  . Chronic kidney disease   . Depression   . Diastolic heart failure (HCC)   . GERD (gastroesophageal reflux disease)   . Hyperlipidemia   . Hyperparathyroidism (HCC)   . Hypertension   . Hypothyroidism   . Migraine   . Neuromuscular disorder (HCC)   . Seizures (HCC)    3 years ago,06/20/19  . Sleep apnea    cpap  . Syncope 08/30/2015  . Tachycardia   . Trochanteric bursitis of both hips 2012   Confirmed on MRI    Current Outpatient Medications on File  Prior to Visit  Medication Sig Dispense Refill  . aspirin EC 81 MG tablet Take 1 tablet (81 mg total) by mouth daily. 90 tablet 3  . atorvastatin (LIPITOR) 20 MG tablet Take 1 tablet (20 mg total) by mouth daily. 90 tablet 3  . buPROPion (WELLBUTRIN XL) 150 MG 24 hr tablet TAKE 1 TABLET BY MOUTH EVERY DAY 90 tablet 1  . busPIRone (BUSPAR) 7.5 MG tablet Take 1 tablet (7.5 mg total) by mouth 3 (three) times daily. 90 tablet 3  . diclofenac sodium (VOLTAREN) 1 % GEL Apply 2 g topically daily as needed (for knee pain). 100 g 3  . DULoxetine (CYMBALTA) 60 MG capsule TAKE 1 CAPSULE EVERY DAY 90 capsule 1  . esomeprazole (NEXIUM) 40 MG capsule TAKE 1 CAPSULE EVERY DAY 90 capsule 1  . fluticasone (FLONASE) 50 MCG/ACT nasal spray PLACE 2 SPRAYS AT BEDTIME INTO BOTH NOSTRILS. (Patient taking differently: Place 1 spray into both nostrils 2 (two) times a day. ) 16 g 5  . gabapentin (NEURONTIN) 600 MG tablet Take 1 tablet (600 mg total) by mouth 3 (three) times daily. 270 tablet 1  . hydrOXYzine (ATARAX/VISTARIL) 50 MG tablet TAKE 1 TABLET EVERY  6 HOURS AS NEEDED 360 tablet 2  . levothyroxine (SYNTHROID) 50 MCG tablet Take 1 tablet (50 mcg total) by mouth every other day. Before breakfast, alternated with 49mcg tablet. 90 tablet 1  . levothyroxine (SYNTHROID) 75 MCG tablet TAKE 1 TABLET (75 MCG TOTAL) BY MOUTH DAILY BEFORE BREAKFAST. 90 tablet 1  . Melatonin 10 MG TABS Take 1 tablet by mouth daily.    . ondansetron (ZOFRAN-ODT) 8 MG disintegrating tablet Take 0.5-1 tablets (4-8 mg total) by mouth every 8 (eight) hours as needed for nausea (from migraines). 20 tablet 5  . prazosin (MINIPRESS) 2 MG capsule TAKE 2 TABLETS BY MOUTH ALONG WITH THE 5MG  TABLET TO MAKE A TOTAL OF 9MG  DOSE AT BEDTIME 180 capsule 0  . prazosin (MINIPRESS) 5 MG capsule TAKE 2 TO 3 CAPSULES AT BEDTIME 180 capsule 0  . QUEtiapine (SEROQUEL) 300 MG tablet TAKE 1 TABLET BY MOUTH EVERYDAY AT BEDTIME 90 tablet 1  . QUEtiapine (SEROQUEL) 50 MG  tablet TAKE 1 TABLET AT BEDTIME AS NEEDED 90 tablet 0  . rizatriptan (MAXALT-MLT) 10 MG disintegrating tablet Take 1 tablet (10 mg total) by mouth as needed for migraine. May repeat in 2 hours if needed 30 tablet 3   No current facility-administered medications on file prior to visit.    Allergies  Allergen Reactions  . Tramadol Nausea And Vomiting  . Trazodone And Nefazodone Other (See Comments)    Per pt trazodone caused hallucinations and behavior changes     Assessment/Plan:  1. Hyperlipidemia -   Richardine Service, PharmD PGY1 Pharmacy Resident

## 2019-07-26 ENCOUNTER — Ambulatory Visit: Payer: Medicare Other

## 2019-07-30 ENCOUNTER — Other Ambulatory Visit: Payer: Self-pay

## 2019-07-30 ENCOUNTER — Ambulatory Visit (INDEPENDENT_AMBULATORY_CARE_PROVIDER_SITE_OTHER): Payer: Medicare Other | Admitting: Pharmacist

## 2019-07-30 DIAGNOSIS — E782 Mixed hyperlipidemia: Secondary | ICD-10-CM | POA: Diagnosis not present

## 2019-07-30 NOTE — Progress Notes (Signed)
Patient ID: Jennifer Davidson                 DOB: 11/28/1955                    MRN: [id number]     HPI: Jennifer Davidson is a 64 y.o. female patient referred to lipid clinic by Dr. Radford Davidson. PMH is significant for HTN, diastolic CHF with LVEF 15-17% in January 2021, and OSA on PAP therapy. Patient was last seen by Dr. Radford Davidson via telemedicine on 05/22/19 for dizziness. A chest CT was done on 07/02/19 and showed mild CAD in the mid LAD and normal coronary origin with left dominance. Coronary calcium score was 216 (89th percentile). Patient is prescribed a statin (atorvastatin 20mg  daily) for hyperlipidemia by her PCP with a recent lipid panel 05/15/19: TC 153, TG 174, HDL 56, LDL 68.  Patient presents to clinic today for initial visit to treat triglycerides. Patient endorsed a relatively low fat diet with a moderate amount of carbohydrates. Patient denies alcohol use and endorses eating few desserts or sweets. Jennifer Davidson has limited ability to exercise due to back pain but still tries to walk and utilize her stationary bike at home. Patient was on fenofibrate in the past but has not tried any other triglyceride lowering therapy.   [employer] Insurance: Rx BIN [id number] RxPCN [id number] Rx [id number] [id number] ID [id number]  Current Medications: atorvastatin 20 mg daily  Intolerances: fenofibrate 145 mg daily (2014), rosuvastatin 20 mg daily (2014)  Risk Factors: HTN, OSA, family history, positive calcium score  LDL goal: <70 mg/dL TG goal: <150 mg/dL  Diet:  Breakfast - bowl of cereal, egg, toast Lunch - half of a sandwich Dinner - chicken, baked potato, salad   Exercise: back pain limits her mobility but trying to walk more and utilize her stationary bike   Family History: The patient's family history includes heart disease in her father and mother. Her mother had a hx of CAD with CABG.  Social History: denies drug or alcohol use; never smoker  Labs: 05/15/19: TC 153, TG 174, HDL 56, LDL 68 (atorvastatin 20mg   daily) 10/26/18: TC 275, TG 364, HDL 51, LDL 151  Past Medical History:  Diagnosis Date  . Acute encephalopathy 06/25/2016  . Anxiety   . Cataract 08/31/2017   left eye  . Cholesterol serum increased   . Chronic back pain    chronic Rt low back pain. s/p L4-5 fusion. failed Rt facet injections. poss due to Rt SI joint dysfunction.  . Chronic kidney disease   . Depression   . Diastolic heart failure ([place])   . GERD (gastroesophageal reflux disease)   . Hyperlipidemia   . Hyperparathyroidism ([place])   . Hypertension   . Hypothyroidism   . Migraine   . Neuromuscular disorder ([place])   . Seizures ([place])    3 years ago,06/20/19  . Sleep apnea    cpap  . Syncope 08/30/2015  . Tachycardia   . Trochanteric bursitis of both hips 2012   Confirmed on MRI    Current Outpatient Medications on File Prior to Visit  Medication Sig Dispense Refill  . aspirin EC 81 MG tablet Take 1 tablet (81 mg total) by mouth daily. 90 tablet 3  . atorvastatin (LIPITOR) 20 MG tablet Take 1 tablet (20 mg total) by mouth daily. 90 tablet 3  . buPROPion (WELLBUTRIN XL) 150 MG 24 hr tablet TAKE 1 TABLET BY MOUTH EVERY DAY 90 tablet 1  .  busPIRone (BUSPAR) 7.5 MG tablet Take 1 tablet (7.5 mg total) by mouth 3 (three) times daily. 90 tablet 3  . diclofenac sodium (VOLTAREN) 1 % GEL Apply 2 g topically daily as needed (for knee pain). 100 g 3  . DULoxetine (CYMBALTA) 60 MG capsule TAKE 1 CAPSULE EVERY DAY 90 capsule 1  . esomeprazole (NEXIUM) 40 MG capsule TAKE 1 CAPSULE EVERY DAY 90 capsule 1  . fluticasone (FLONASE) 50 MCG/ACT nasal spray PLACE 2 SPRAYS AT BEDTIME INTO BOTH NOSTRILS. (Patient taking differently: Place 1 spray into both nostrils 2 (two) times a day. ) 16 g 5  . gabapentin (NEURONTIN) 600 MG tablet Take 1 tablet (600 mg total) by mouth 3 (three) times daily. 270 tablet 1  . hydrOXYzine (ATARAX/VISTARIL) 50 MG tablet TAKE 1 TABLET EVERY 6 HOURS AS NEEDED 360 tablet 2  . levothyroxine (SYNTHROID) 50 MCG  tablet Take 1 tablet (50 mcg total) by mouth every other day. Before breakfast, alternated with tablet. 90 tablet 1  . levothyroxine (SYNTHROID) 75 MCG tablet TAKE 1 TABLET (75 MCG TOTAL) BY MOUTH DAILY BEFORE BREAKFAST. 90 tablet 1  . Melatonin 10 MG TABS Take 1 tablet by mouth daily.    . ondansetron (ZOFRAN-ODT) 8 MG disintegrating tablet Take 0.5-1 tablets (4-8 mg total) by mouth every 8 (eight) hours as needed for nausea (from migraines). 20 tablet 5  . prazosin (MINIPRESS) 2 MG capsule TAKE 2 TABLETS BY MOUTH ALONG WITH THE 5MG  TABLET TO MAKE A TOTAL OF 9MG  DOSE AT BEDTIME 180 capsule 0  . prazosin (MINIPRESS) 5 MG capsule TAKE 2 TO 3 CAPSULES AT BEDTIME 180 capsule 0  . QUEtiapine (SEROQUEL) 300 MG tablet TAKE 1 TABLET BY MOUTH EVERYDAY AT BEDTIME 90 tablet 1  . QUEtiapine (SEROQUEL) 50 MG tablet TAKE 1 TABLET AT BEDTIME AS NEEDED 90 tablet 0  . rizatriptan (MAXALT-MLT) 10 MG disintegrating tablet Take 1 tablet (10 mg total) by mouth as needed for migraine. May repeat in 2 hours if needed 30 tablet 3   No current facility-administered medications on file prior to visit.    Allergies  Allergen Reactions  . Tramadol Nausea And Vomiting  . Trazodone And Nefazodone Other (See Comments)    Per pt trazodone caused hallucinations and behavior changes     Assessment/Plan:  1. Hypertriglyceridemia - Based on TG goal <150, patient is above goal. Patient was counseled on difference between LDL and triglycerides including how limiting carbohydrate, sugar, and alcohol intake can improve triglyceride levels. Patient was encouraged to limit sugar in the form of salad dressings and sauces and continue to exercise as her back allows. Benefit of Vascepa was discussed with the patient including its ability to lower TG by 30%, prevent cardiovascular events, and help with plaque regression in the arteries. Patient was agreeable to start Vascepa 2g twice daily with meals. We will start on a prior  authorization and [employer] grant application for Jennifer Davidson and send the prescription to her [facility] pharmacy as well as call her to let her know it has been sent.   Brooke Dare, PharmD Candidate  [facility], Pharm.D, BCPS, CPP [facility]  [address], [place], [address] [place]  Phone: [phone]; Fax: [phone] '

## 2019-07-30 NOTE — Patient Instructions (Signed)
It was great meeting you!  Your triglycerides are still above goal of <150. Start taking Vascepa 2g twice daily with meals. We will start a prior authorization and [employer] grant application and give you a call when the prescription has been sent to [facility] pharmacy. Continue to limit your carbohydrate and sugar intake.   Thanks!

## 2019-07-31 ENCOUNTER — Telehealth: Payer: Self-pay | Admitting: Pharmacist

## 2019-07-31 MED ORDER — ICOSAPENT ETHYL 1 G PO CAPS: 2.0000 g | ORAL_CAPSULE | Freq: Two times a day (BID) | ORAL | 11 refills | Status: DC

## 2019-07-31 NOTE — Telephone Encounter (Signed)
Jennifer Davidson is covered. Copay is $90. Jennifer Davidson for [employer] approved which will bring cost to [facility] Card Id [id number] Group [id number] PCN [id number] BIN [id number]

## 2019-08-01 ENCOUNTER — Telehealth: Payer: Self-pay

## 2019-08-01 DIAGNOSIS — E782 Mixed hyperlipidemia: Secondary | ICD-10-CM

## 2019-08-01 NOTE — Telephone Encounter (Signed)
Spoke to patient over the phone regarding her Vascepa prescription. Her prior authorization was approved and [employer] [facility] was accepted. Prescription was sent to [facility] in [place]. Patient is scheduled for follow up lipid panel in 2 months.

## 2019-08-03 ENCOUNTER — Other Ambulatory Visit: Payer: Self-pay | Admitting: Family Medicine

## 2019-08-06 NOTE — Progress Notes (Signed)
Cardiology Office Note    Date:  08/07/2019   ID:  Jennifer Davidson, DOB 1956/01/07, MRN [id number]  PCP:  Rutherford Guys, MD  Cardiologist: Fransico Him, MD EPS: None  Chief Complaint  Patient presents with  . Follow-up    History of Present Illness:  Jennifer Davidson is a 64 y.o. female with history of hypertension, diastolic CHF, OSA on CPAP, hypothyroidism, history of lightheadedness and syncope 2017 normal 2D echo at that time. Patient saw Dr. Radford Pax via telemedicine 05/22/2019 at which time she was having some dizziness with symptoms that sound more like vertigo and she was encouraged to drink 64 ounces of fluid daily and liberalize her salt as her blood pressures were running low.  Event monitor was ordered-NSR with occasional PVC PAC's.  She also had some shortness of breath felt secondary to obesity and deconditioning although with CRFs including hypertension HLD and family history of CAD, coronary CTA was ordered.  Calcium score was 216 which put her in the 89th percentile for age and sex match control.  She had mild 25 to 49% mid LAD medical therapy recommended.  Baby aspirin once daily and Vascepa for elevated triglycerides.  Patient comes in for f/u. No further dizziness, chest pain or shortness of breath. Occasionally has palpitations mid morning-lasts a few minutes and has to sit down because it makes her feel jittery. Has occasional diet coke. No decongestants. Exercises 30-35 min/day. Has lost 5 lbs.   Past Medical History:  Diagnosis Date  . Acute encephalopathy 06/25/2016  . Anxiety   . Cataract 08/31/2017   left eye  . Cholesterol serum increased   . Chronic back pain    chronic Rt low back pain. s/p L4-5 fusion. failed Rt facet injections. poss due to Rt SI joint dysfunction.  . Chronic kidney disease   . Depression   . Diastolic heart failure ([place])   . GERD (gastroesophageal reflux disease)   . Hyperlipidemia   . Hyperparathyroidism ([place])   . Hypertension   .  Hypothyroidism   . Migraine   . Neuromuscular disorder ([place])   . Seizures ([place])    3 years ago,06/20/19  . Sleep apnea    cpap  . Syncope 08/30/2015  . Tachycardia   . Trochanteric bursitis of both hips 2012   Confirmed on MRI    Past Surgical History:  Procedure Laterality Date  . ABDOMINAL HYSTERECTOMY    . APPENDECTOMY  1986  . BACK SURGERY     L4-5 fusion  . CATARACT EXTRACTION    . KNEE SURGERY    . SHOULDER ARTHROSCOPY WITH SUBACROMIAL DECOMPRESSION Left 10/20/2018   Procedure: SHOULDER ARTHROSCOPY, ROTATOR CUFF DEBRIDEMENT, GLENOHUMERAL JOINT DEBRIDEMENT, ACROMIOPLASTY, DISTAL CLAVICLE RESECTION;  Surgeon: [name], MD;  Location: [facility];  Service: Orthopedics;  Laterality: Left;    Current Medications: Current Meds  Medication Sig  . aspirin EC 81 MG tablet Take 1 tablet (81 mg total) by mouth daily.  Marland Kitchen atorvastatin (LIPITOR) 20 MG tablet Take 1 tablet (20 mg total) by mouth daily.  Marland Kitchen buPROPion (WELLBUTRIN XL) 150 MG 24 hr tablet TAKE 1 TABLET BY MOUTH EVERY DAY  . busPIRone (BUSPAR) 7.5 MG tablet Take 1 tablet (7.5 mg total) by mouth 3 (three) times daily.  . diclofenac sodium (VOLTAREN) 1 % GEL Apply 2 g topically daily as needed (for knee pain).  . DULoxetine (CYMBALTA) 60 MG capsule TAKE 1 CAPSULE EVERY DAY  . esomeprazole (NEXIUM) 40 MG capsule TAKE 1  CAPSULE EVERY DAY  . fluticasone (FLONASE) 50 MCG/ACT nasal spray PLACE 2 SPRAYS AT BEDTIME INTO BOTH NOSTRILS.  Marland Kitchen. gabapentin (NEURONTIN) 600 MG tablet Take 1 tablet (600 mg total) by mouth 3 (three) times daily.  . hydrOXYzine (ATARAX/VISTARIL) 50 MG tablet TAKE 1 TABLET EVERY 6 HOURS AS NEEDED  . icosapent Ethyl (VASCEPA) 1 g capsule Take 2 capsules (2 g total) by mouth 2 (two) times daily.  Marland Kitchen. levothyroxine (SYNTHROID) 50 MCG tablet Take 1 tablet (50 mcg total) by mouth every other day. Before breakfast, alternated with 75mcg tablet.  Marland Kitchen. levothyroxine (SYNTHROID) 75 MCG tablet Take 75 mcg by mouth every other day.   . Melatonin 10 MG TABS Take 1 tablet by mouth daily.  . ondansetron (ZOFRAN-ODT) 8 MG disintegrating tablet Take 0.5-1 tablets (4-8 mg total) by mouth every 8 (eight) hours as needed for nausea (from migraines).  . prazosin (MINIPRESS) 2 MG capsule TAKE 2 TABLETS BY MOUTH ALONG WITH THE 5MG  TABLET TO MAKE A TOTAL OF 9MG  DOSE AT BEDTIME  . prazosin (MINIPRESS) 5 MG capsule TAKE 2 TO 3 CAPSULES AT BEDTIME  . QUEtiapine (SEROQUEL) 300 MG tablet TAKE 1 TABLET BY MOUTH EVERYDAY AT BEDTIME  . QUEtiapine (SEROQUEL) 50 MG tablet TAKE 1 TABLET AT BEDTIME AS NEEDED  . rizatriptan (MAXALT-MLT) 10 MG disintegrating tablet Take 1 tablet (10 mg total) by mouth as needed for migraine. May repeat in 2 hours if needed     Allergies:   Tramadol and Trazodone and nefazodone   Social History   Socioeconomic History  . Marital status: Married    Spouse name: Not on file  . Number of children: 3  . Years of education: 9112  . Highest education level: Some college, no degree  Occupational History  . Not on file  Tobacco Use  . Smoking status: Never Smoker  . Smokeless tobacco: Never Used  Substance and Sexual Activity  . Alcohol use: No  . Drug use: No    Comment: 08-31-2016 PER PT NO   . Sexual activity: Yes    Partners: Male    Comment: married  Other Topics Concern  . Not on file  Social History Narrative  . Not on file   Social Determinants of Health   Financial Resource Strain:   . Difficulty of Paying Living Expenses:   Food Insecurity:   . Worried About Programme researcher, broadcasting/film/videounning Out of Food in the Last Year:   . Baristaan Out of Food in the Last Year:   Transportation Needs:   . Freight forwarderLack of Transportation (Medical):   Marland Kitchen. Lack of Transportation (Non-Medical):   Physical Activity:   . Days of Exercise per Week:   . Minutes of Exercise per Session:   Stress:   . Feeling of Stress :   Social Connections:   . Frequency of Communication with Friends and Family:   . Frequency of Social Gatherings with Friends and  Family:   . Attends Religious Services:   . Active Member of Clubs or Organizations:   . Attends BankerClub or Organization Meetings:   Marland Kitchen. Marital Status:      Family History:  The patient's   family history includes COPD in her father; Cancer in her maternal grandmother; Diabetes in her brother, mother, and sister; Heart disease in her father and mother; Irritable bowel syndrome in her father; Kidney disease in her mother; Seizures in her father; Thyroid disease in her mother.   ROS:   Please see the history of present illness.  ROS All other systems reviewed and are negative.   PHYSICAL EXAM:   VS:  BP 138/86   Pulse 96   Ht 5\' 3"  (1.6 m)   Wt 213 lb (96.6 kg)   SpO2 96%   BMI 37.73 kg/m   Physical Exam  GEN: Well nourished, well developed, in no acute distress  Neck: no JVD, carotid bruits, or masses Cardiac:RRR; no murmurs, rubs, or gallops  Respiratory:  clear to auscultation bilaterally, normal work of breathing GI: soft, nontender, nondistended, + BS Ext: without cyanosis, clubbing, or edema, Good distal pulses bilaterally Neuro:  Alert and Oriented x 3 Psych: euthymic mood, full affect  Wt Readings from Last 3 Encounters:  08/07/19 213 lb (96.6 kg)  07/19/19 214 lb 9.6 oz (97.3 kg)  07/10/19 200 lb (90.7 kg)      Studies/Labs Reviewed:   EKG:  EKG is not ordered today.    Recent Labs: 10/26/2018: Hemoglobin 12.1; Platelets 270 05/15/2019: ALT 22 06/04/2019: NT-Pro BNP 73 07/02/2019: BUN 10; Creatinine, Ser 1.46; Potassium 4.1; Sodium 138 07/16/2019: TSH 2.880   Lipid Panel    Component Value Date/Time   CHOL 153 05/15/2019 0813   TRIG 174 (H) 05/15/2019 0813   HDL 56 05/15/2019 0813   CHOLHDL 2.7 05/15/2019 0813   CHOLHDL 5.5 (H) 01/29/2016 1009   VLDL 63 (H) 01/29/2016 1009   LDLCALC 68 05/15/2019 0813    Additional studies/ records that were reviewed today include:  Coronary CTA 06/2019 IMPRESSION: 1. Coronary calcium score of 216. This was 89th  percentile for age and sex matched control.   2. Normal coronary origin with left dominance.   3. Mild CAD (25-49%) in the mid LAD.   4. Small pericardial effusion.   RECOMMENDATIONS: 1. Mild non-obstructive CAD (25-49%). Consider non-atherosclerotic causes of chest pain. Consider preventive therapy and risk factor modification.   07/2019, MD     Electronically Signed   By: Lennie Odor   On: 07/02/2019 19:20    Holter monitor 06/21/2019 normal sinus rhythm to sinus tachycardia 69 155 bpm, occasional PVCs and PACs    2D echo 05/28/2019 IMPRESSIONS     1. Left ventricular ejection fraction, by visual estimation, is 60 to  65%. The left ventricle has normal function. There is mildly increased  left ventricular hypertrophy.   2. Left ventricular diastolic parameters are consistent with Grade II  diastolic dysfunction (pseudonormalization).   3. The left ventricle has no regional wall motion abnormalities.   4. Global right ventricle has normal systolic function.The right  ventricular size is normal. No increase in right ventricular wall  thickness.   5. Left atrial size was normal.   6. Right atrial size was normal.   7. Presence of pericardial fat pad.   8. Trivial pericardial effusion is present.   9. The pericardial effusion is anterior to the right ventricle.  10. The mitral valve is grossly normal. Trivial mitral valve  regurgitation.  11. The tricuspid valve is grossly normal.  12. The aortic valve is tricuspid. Aortic valve regurgitation is not  visualized.  13. The pulmonic valve was grossly normal. Pulmonic valve regurgitation is  trivial.  14. TR signal is inadequate for assessing pulmonary artery systolic  pressure.  15. The inferior vena cava is normal in size with greater than 50%  respiratory variability, suggesting right atrial pressure of 3 mmHg.   FINDINGS   Left Ventricle: Left ventricular ejection fraction, by visual estimation,  is 60 to  65%.  The left ventricle has normal function. The left ventricle  has no regional wall motion abnormalities. The left ventricular internal  cavity size was the left  ventricle is normal in size. There is mildly increased left ventricular  hypertrophy. Left ventricular diastolic parameters are consistent with  Grade II diastolic dysfunction (pseudonormalization).   Right Ventricle: The right ventricular size is normal. No increase in  right ventricular wall thickness. Global RV systolic function is has  normal systolic function.   Left Atrium: Left atrial size was normal in size.   Right Atrium: Right atrial size was normal in size   Pericardium: Trivial pericardial effusion is present. The pericardial  effusion is anterior to the right ventricle. Presence of pericardial fat  pad.   Mitral Valve: The mitral valve is grossly normal. Trivial mitral valve  regurgitation.   Tricuspid Valve: The tricuspid valve is grossly normal. Tricuspid valve  regurgitation is trivial.   Aortic Valve: The aortic valve is tricuspid. Aortic valve regurgitation is  not visualized.   Pulmonic Valve: The pulmonic valve was grossly normal. Pulmonic valve  regurgitation is trivial. Pulmonic regurgitation is trivial.   Aorta: The aortic root is normal in size and structure.   Venous: The inferior vena cava is normal in size with greater than 50%  respiratory variability, suggesting right atrial pressure of 3 mmHg.      ASSESSMENT:    1. Essential hypertension   2. Chronic diastolic CHF (congestive heart failure) (HCC)   3. Dyspnea, unspecified type   4. Coronary artery calcification   5. Mixed hyperlipidemia   6. Chronic diastolic heart failure (HCC)   7. Morbid obesity (HCC)      PLAN:  In order of problems listed above:  Hypertension controlled  Chronic diastolic CHF 2D echo in January normal LV function EF 60 to 65% with mild increased LVH and grade 2 DD-compensated  Shortness of  breath with recent work-up including coronary CTA, 2D echo and Holter monitor without significant cause of shortness of breath.  Most likely due to deconditioning and obesity  Coronary artery calcification on coronary CTA with elevated calcium score now on aspirin and Vascepa. Recommend exercise and weight loss as well.  Mixed hyperlipidemia Vascepa recently added and managed by lipid clinic. Patient paid $98 for 30 days and has a card saying it should cost her nothing. Our pharamacist will reach out to [facility] who processed it wrong  Obesity-patient upset about weight gain over the years and willing to be referred to weight loss center.    Medication Adjustments/Labs and Tests Ordered: Current medicines are reviewed at length with the patient today.  Concerns regarding medicines are outlined above.  Medication changes, Labs and Tests ordered today are listed in the Patient Instructions below. Patient Instructions  Medication Instructions:  Your physician recommends that you continue on your current medications as directed. Please refer to the Current Medication list given to you today.  *If you need a refill on your cardiac medications before your next appointment, please call your pharmacy*  Follow-Up: At [facility], you and your health needs are our priority.  As part of our continuing mission to provide you with exceptional heart care, we have created designated Provider Care Teams.  These Care Teams include your primary Cardiologist (physician) and Advanced Practice Providers (APPs -  Physician Assistants and Nurse Practitioners) who all work together to provide you with the care you need, when you need it.   Your next appointment:   6 month(s)  The format for your next appointment:   In Person  Provider:   Armanda Magic, MD   Other Instructions You have been referred to the [facility].      Signed, Jacolyn Reedy, [name]  08/07/2019 1:10 PM    [facility] [address] [place], [place], [place]  [address] Phone: [phone]; Fax: [phone]

## 2019-08-07 ENCOUNTER — Encounter: Payer: Self-pay | Admitting: Physician Assistant

## 2019-08-07 ENCOUNTER — Other Ambulatory Visit: Payer: Self-pay

## 2019-08-07 ENCOUNTER — Ambulatory Visit (INDEPENDENT_AMBULATORY_CARE_PROVIDER_SITE_OTHER): Payer: Medicare Other | Admitting: Physician Assistant

## 2019-08-07 ENCOUNTER — Telehealth: Payer: Self-pay | Admitting: Pharmacist

## 2019-08-07 VITALS — BP 138/86 | HR 96 | Ht 63.0 in | Wt 213.0 lb

## 2019-08-07 DIAGNOSIS — I2584 Coronary atherosclerosis due to calcified coronary lesion: Secondary | ICD-10-CM

## 2019-08-07 DIAGNOSIS — I10 Essential (primary) hypertension: Secondary | ICD-10-CM

## 2019-08-07 DIAGNOSIS — I2510 Atherosclerotic heart disease of native coronary artery without angina pectoris: Secondary | ICD-10-CM

## 2019-08-07 DIAGNOSIS — R0600 Dyspnea, unspecified: Secondary | ICD-10-CM | POA: Diagnosis not present

## 2019-08-07 DIAGNOSIS — E782 Mixed hyperlipidemia: Secondary | ICD-10-CM

## 2019-08-07 DIAGNOSIS — I5032 Chronic diastolic (congestive) heart failure: Secondary | ICD-10-CM | POA: Diagnosis not present

## 2019-08-07 NOTE — Telephone Encounter (Signed)
Called and left VM for patient to call back. Patient saw Wynell Balloon today and reported that her Vascepa cost her $98. This should have been free as we faxed over health[employer] grant information. Called pharmacy to ask them to enter the information. Person on the phone refused to enter it then and said "if you fax it over, ill take a look at it" He proceeded to say that she has medicare part D. I explained to him that this is NOT a manufacture copay card and that it is a grant specifically for medicare patients. I faxed over info. Calling patient to offer to send her her card info and a reimbursement form.   Addendum: patient called back- advised to make sure Vascepa is free at pharmacy before she picks up next refill. Will send her pharmacy card and reimbursement form. Advised patient that if she would like to get reimbursed she can drop off forms and receipt and we will fax.

## 2019-08-07 NOTE — Patient Instructions (Signed)
Medication Instructions:  Your physician recommends that you continue on your current medications as directed. Please refer to the Current Medication list given to you today.  *If you need a refill on your cardiac medications before your next appointment, please call your pharmacy*  Follow-Up: At [facility], you and your health needs are our priority.  As part of our continuing mission to provide you with exceptional heart care, we have created designated Provider Care Teams.  These Care Teams include your primary Cardiologist (physician) and Advanced Practice Providers (APPs -  Physician Assistants and Nurse Practitioners) who all work together to provide you with the care you need, when you need it.   Your next appointment:   6 month(s)  The format for your next appointment:   In Person  Provider:   Armanda Magic, MD   Other Instructions You have been referred to [facility].

## 2019-08-12 ENCOUNTER — Other Ambulatory Visit: Payer: Self-pay | Admitting: Family Medicine

## 2019-08-12 NOTE — Telephone Encounter (Signed)
Requested Prescriptions  Pending Prescriptions Disp Refills  . busPIRone (BUSPAR) 7.5 MG tablet [Pharmacy Med Name: BUSPIRONE HCL 7.5 MG TABLET] 270 tablet 2    Sig: TAKE 1 TABLET BY MOUTH 3 TIMES DAILY.     Psychiatry: Anxiolytics/Hypnotics - Non-controlled Passed - 08/12/2019 10:32 AM      Passed - Valid encounter within last 6 months    Recent Outpatient Visits          3 weeks ago Other specified hypothyroidism   [facility], Meda Coffee, MD   2 months ago Dizziness   [facility], Meda Coffee, MD   2 months ago Medicare annual wellness visit, subsequent   [facility] Care at [facility], Manus Rudd, MD   5 months ago Acquired hypothyroidism   [facility], Meda Coffee, MD   8 months ago Moderate episode of recurrent major depressive disorder [facility])   [facility], Meda Coffee, MD      Future Appointments            In 5 months Myles Lipps, MD Primary Care at [place], [facility]

## 2019-08-29 ENCOUNTER — Other Ambulatory Visit: Payer: Self-pay | Admitting: Family Medicine

## 2019-09-05 ENCOUNTER — Ambulatory Visit: Payer: Self-pay | Admitting: Psychiatry

## 2019-09-12 ENCOUNTER — Other Ambulatory Visit: Payer: Self-pay | Admitting: Family Medicine

## 2019-09-12 NOTE — Telephone Encounter (Signed)
Requested medications are due for refill today?  Yes - This medication refill cannot be delegated.    Requested medications are on active medication list?  Yes  Last Refill:   06/29/2019   # 90 with no refills  Future visit scheduled?  Yes  Notes to Clinic:  this medication cannot be delegated.

## 2019-09-12 NOTE — Telephone Encounter (Signed)
Requested Prescriptions  Pending Prescriptions Disp Refills  . esomeprazole (NEXIUM) 40 MG capsule [Pharmacy Med Name: ESOMEPRAZOLE MAGNESIUM 40 MG Capsule Delayed Release] 90 capsule 3    Sig: TAKE 1 CAPSULE EVERY DAY     Gastroenterology: Proton Pump Inhibitors Passed - 09/12/2019  4:16 AM      Passed - Valid encounter within last 12 months    Recent Outpatient Visits          1 month ago Other specified hypothyroidism   [facility], Lilia Argue, MD   3 months ago Dizziness   [facility], Lilia Argue, MD   3 months ago Medicare annual wellness visit, subsequent   Primary Care at [facility], Arlie Solomons, MD   6 months ago Acquired hypothyroidism   [facility], Lilia Argue, MD   9 months ago Moderate episode of recurrent major depressive disorder [facility])   [facility] at [facility], Lilia Argue, MD      Future Appointments            In 4 months Rutherford Guys, MD Primary Care at [place], [facility]           . DULoxetine (CYMBALTA) 60 MG capsule [Pharmacy Med Name: DULOXETINE HYDROCHLORIDE 60 MG Capsule Delayed Release Particles] 90 capsule 1    Sig: TAKE 1 CAPSULE EVERY DAY     Psychiatry: Antidepressants - SNRI Passed - 09/12/2019  4:16 AM      Passed - Completed PHQ-2 or PHQ-9 in the last 360 days.      Passed - Last BP in normal range    BP Readings from Last 1 Encounters:  08/07/19 138/86         Passed - Valid encounter within last 6 months    Recent Outpatient Visits          1 month ago Other specified hypothyroidism   [facility], Lilia Argue, MD   3 months ago Dizziness   [facility], Lilia Argue, MD   3 months ago Medicare annual wellness visit, subsequent   Primary Care at [facility], Arlie Solomons, MD   6 months ago Acquired hypothyroidism   [facility], Lilia Argue, MD   9 months ago Moderate episode of recurrent major depressive disorder [facility])   [facility] at [facility], Lilia Argue, MD      Future Appointments            In 4 months Rutherford Guys, MD Primary Care at [place], [facility]           . QUEtiapine (SEROQUEL) 50 MG tablet [Pharmacy Med Name: QUETIAPINE FUMARATE 50 MG Tablet] 90 tablet 0    Sig: TAKE 1 TABLET AT BEDTIME AS NEEDED     Not Delegated - Psychiatry:  Antipsychotics - Second Generation (Atypical) - quetiapine Failed - 09/12/2019  4:16 AM      Failed - This refill cannot be delegated      Passed - ALT in normal range and within 180 days    ALT  Date Value Ref Range Status  05/15/2019 22 0 - 32 IU/L Final         Passed - AST in normal range and within 180 days    AST  Date Value Ref Range Status  05/15/2019 18 0 - 40 IU/L Final         Passed - Completed PHQ-2  or PHQ-9 in the last 360 days.      Passed - Last BP in normal range    BP Readings from Last 1 Encounters:  08/07/19 138/86         Passed - Valid encounter within last 6 months    Recent Outpatient Visits          1 month ago Other specified hypothyroidism   [facility], Meda Coffee, MD   3 months ago Dizziness   [facility], Meda Coffee, MD   3 months ago Medicare annual wellness visit, subsequent   Primary Care at [facility], Manus Rudd, MD   6 months ago Acquired hypothyroidism   [facility], Meda Coffee, MD   9 months ago Moderate episode of recurrent major depressive disorder [facility])   [facility] at [facility], Meda Coffee, MD      Future Appointments            In 4 months Myles Lipps, MD Primary Care at [place], [facility]

## 2019-09-12 NOTE — Telephone Encounter (Signed)
Patient is requesting a refill of the following medications: Requested Prescriptions   Pending Prescriptions Disp Refills  . QUEtiapine (SEROQUEL) 50 MG tablet [Pharmacy Med Name: QUETIAPINE FUMARATE 50 MG Tablet] 90 tablet 0    Sig: TAKE 1 TABLET AT BEDTIME AS NEEDED   Signed Prescriptions Disp Refills  . esomeprazole (NEXIUM) 40 MG capsule 90 capsule 3    Sig: TAKE 1 CAPSULE EVERY DAY    Authorizing Provider: [place], [place] M    Ordering User: WRIGHT, DIANE P  . DULoxetine (CYMBALTA) 60 MG capsule 90 capsule 1    Sig: TAKE 1 CAPSULE EVERY DAY    Authorizing Provider: Leretha Pol, [place] M    Ordering User: Suszanne Finch    Date of patient request: 09/12/2019 Last office visit: 07/19/2019 Date of last refill: 06/29/2019 Last refill amount:90 tablets  Follow up time period per chart: Follow up appointment scheduled   Patient should have medication until 09/26/2019

## 2019-09-13 ENCOUNTER — Other Ambulatory Visit: Payer: Self-pay | Admitting: Family Medicine

## 2019-09-13 DIAGNOSIS — F331 Major depressive disorder, recurrent, moderate: Secondary | ICD-10-CM

## 2019-09-13 NOTE — Telephone Encounter (Signed)
Requested medication (s) are due for refill today- no  Requested medication (s) are on the active medication list -yes  Future visit scheduled -yes  Last refill: 07/22/19  Notes to clinic: This medication was sent for review/pended yesterday- non delegated Rx  Requested Prescriptions  Pending Prescriptions Disp Refills   QUEtiapine (SEROQUEL) 300 MG tablet [Pharmacy Med Name: QUETIAPINE FUMARATE 300 MG TAB] 90 tablet 1    Sig: TAKE 1 TABLET BY MOUTH EVERYDAY AT BEDTIME      Not Delegated - Psychiatry:  Antipsychotics - Second Generation (Atypical) - quetiapine Failed - 09/13/2019  9:24 AM      Failed - This refill cannot be delegated      Passed - ALT in normal range and within 180 days    ALT  Date Value Ref Range Status  05/15/2019 22 0 - 32 IU/L Final          Passed - AST in normal range and within 180 days    AST  Date Value Ref Range Status  05/15/2019 18 0 - 40 IU/L Final          Passed - Completed PHQ-2 or PHQ-9 in the last 360 days.      Passed - Last BP in normal range    BP Readings from Last 1 Encounters:  08/07/19 138/86          Passed - Valid encounter within last 6 months    Recent Outpatient Visits           1 month ago Other specified hypothyroidism   [facility], Meda Coffee, MD   3 months ago Dizziness   [facility], Meda Coffee, MD   4 months ago Medicare annual wellness visit, subsequent   Primary Care at [facility], Manus Rudd, MD   6 months ago Acquired hypothyroidism   [facility], Meda Coffee, MD   9 months ago Moderate episode of recurrent major depressive disorder [facility])   [facility] at [facility], Meda Coffee, MD       Future Appointments             In 4 months Myles Lipps, MD Primary Care at [place], [facility]             Signed Prescriptions Disp Refills   buPROPion (WELLBUTRIN XL) 150 MG 24 hr tablet 90 tablet 1    Sig: TAKE 1 TABLET BY MOUTH EVERY DAY      Psychiatry:  Antidepressants - bupropion Passed - 09/13/2019  9:24 AM      Passed - Completed PHQ-2 or PHQ-9 in the last 360 days.      Passed - Last BP in normal range    BP Readings from Last 1 Encounters:  08/07/19 138/86          Passed - Valid encounter within last 6 months    Recent Outpatient Visits           1 month ago Other specified hypothyroidism   [facility], Meda Coffee, MD   3 months ago Dizziness   [facility], Meda Coffee, MD   4 months ago Medicare annual wellness visit, subsequent   Primary Care at [facility], Manus Rudd, MD   6 months ago Acquired hypothyroidism   [facility], Meda Coffee, MD   9 months ago Moderate episode of recurrent major depressive disorder (HCC)  [facility], Lilia Argue, MD       Future Appointments             In 4 months Rutherford Guys, MD Primary Care at [place], [facility]                Requested Prescriptions  Pending Prescriptions Disp Refills   QUEtiapine (SEROQUEL) 300 MG tablet [Pharmacy Med Name: QUETIAPINE FUMARATE 300 MG TAB] 90 tablet 1    Sig: TAKE 1 TABLET BY MOUTH EVERYDAY AT BEDTIME      Not Delegated - Psychiatry:  Antipsychotics - Second Generation (Atypical) - quetiapine Failed - 09/13/2019  9:24 AM      Failed - This refill cannot be delegated      Passed - ALT in normal range and within 180 days    ALT  Date Value Ref Range Status  05/15/2019 22 0 - 32 IU/L Final          Passed - AST in normal range and within 180 days    AST  Date Value Ref Range Status  05/15/2019 18 0 - 40 IU/L Final          Passed - Completed PHQ-2 or PHQ-9 in the last 360 days.      Passed - Last BP in normal range    BP Readings from Last 1 Encounters:  08/07/19 138/86          Passed - Valid encounter within last 6 months    Recent Outpatient Visits           1 month ago Other specified hypothyroidism   [facility], Lilia Argue, MD   3 months  ago Dizziness   [facility] at [facility], Lilia Argue, MD   4 months ago Medicare annual wellness visit, subsequent   Primary Care at [facility], Arlie Solomons, MD   6 months ago Acquired hypothyroidism   [facility] at [facility], Lilia Argue, MD   9 months ago Moderate episode of recurrent major depressive disorder [facility])   Primary Care at [facility], Lilia Argue, MD       Future Appointments             In 4 months Rutherford Guys, MD Primary Care at [place], [facility]             Signed Prescriptions Disp Refills   buPROPion (WELLBUTRIN XL) 150 MG 24 hr tablet 90 tablet 1    Sig: TAKE 1 TABLET BY MOUTH EVERY DAY      Psychiatry: Antidepressants - bupropion Passed - 09/13/2019  9:24 AM      Passed - Completed PHQ-2 or PHQ-9 in the last 360 days.      Passed - Last BP in normal range    BP Readings from Last 1 Encounters:  08/07/19 138/86          Passed - Valid encounter within last 6 months    Recent Outpatient Visits           1 month ago Other specified hypothyroidism   [facility] at [facility], Lilia Argue, MD   3 months ago Dizziness   [facility] at [facility], Lilia Argue, MD   4 months ago Medicare annual wellness visit, subsequent   Primary Care at [place], MD   6 months ago Acquired hypothyroidism   [facility] at [facility], Lilia Argue, MD   9 months  ago Moderate episode of recurrent major depressive disorder [facility])   [facility], Meda Coffee, MD       Future Appointments             In 4 months Myles Lipps, MD Primary Care at [place], [facility]

## 2019-09-16 ENCOUNTER — Other Ambulatory Visit: Payer: Self-pay | Admitting: Family Medicine

## 2019-09-16 NOTE — Telephone Encounter (Signed)
Requested Prescriptions  Pending Prescriptions Disp Refills  . prazosin (MINIPRESS) 5 MG capsule [Pharmacy Med Name: PRAZOSIN 5 MG CAPSULE] 270 capsule 1    Sig: TAKE 2 TO 3 CAPSULES AT BEDTIME     Cardiovascular:  Alpha Blockers Passed - 09/16/2019  2:51 PM      Passed - Last BP in normal range    BP Readings from Last 1 Encounters:  08/07/19 138/86         Passed - Valid encounter within last 6 months    Recent Outpatient Visits          1 month ago Other specified hypothyroidism   [facility], Meda Coffee, MD   3 months ago Dizziness   [facility], Meda Coffee, MD   4 months ago Medicare annual wellness visit, subsequent   Primary Care at [facility], Manus Rudd, MD   6 months ago Acquired hypothyroidism   [facility] at [facility], Meda Coffee, MD   10 months ago Moderate episode of recurrent major depressive disorder [facility])   [facility] at [facility], Meda Coffee, MD      Future Appointments            In 4 months Myles Lipps, MD Primary Care at [place], [facility]

## 2019-10-05 ENCOUNTER — Telehealth (INDEPENDENT_AMBULATORY_CARE_PROVIDER_SITE_OTHER): Payer: Medicare Other | Admitting: Family Medicine

## 2019-10-05 ENCOUNTER — Encounter: Payer: Self-pay | Admitting: Family Medicine

## 2019-10-05 ENCOUNTER — Other Ambulatory Visit: Payer: Self-pay

## 2019-10-05 DIAGNOSIS — J019 Acute sinusitis, unspecified: Secondary | ICD-10-CM

## 2019-10-05 DIAGNOSIS — R062 Wheezing: Secondary | ICD-10-CM

## 2019-10-05 MED ORDER — BENZONATATE 100 MG PO CAPS: 100.0000 mg | ORAL_CAPSULE | Freq: Three times a day (TID) | ORAL | 0 refills | Status: DC | PRN

## 2019-10-05 MED ORDER — AMOXICILLIN-POT CLAVULANATE 875-125 MG PO TABS: 1.0000 | ORAL_TABLET | Freq: Two times a day (BID) | ORAL | 0 refills | Status: DC

## 2019-10-05 MED ORDER — ALBUTEROL SULFATE HFA 108 (90 BASE) MCG/ACT IN AERS
2.0000 | INHALATION_SPRAY | Freq: Four times a day (QID) | RESPIRATORY_TRACT | 0 refills | Status: DC | PRN
Start: 2019-10-05 — End: 2019-10-28

## 2019-10-05 NOTE — Progress Notes (Signed)
Virtual Visit Note  I connected with patient on 10/05/19 at 234pm by phone (unable to work video) and verified that I am speaking with the correct person using two identifiers. Jennifer Davidson is currently located at home and patient is currently with them during visit. The provider, Myles Lipps, MD is located in their office at time of visit.  I discussed the limitations, risks, security and privacy concerns of performing an evaluation and management service by telephone and the availability of in person appointments. I also discussed with the patient that there may be a patient responsible charge related to this service. The patient expressed understanding and agreed to proceed.   I provided 16 minutes of non-face-to-face time during this encounter.  Chief Complaint  Patient presents with  . Cough    3 wks off and, has had these symptoms before the corona virus pandemic. Taking no meds for the sx Denies fever but does have chills from time to time.    HPI ? 3 weeks of very hacky cough with drainage in her throat Cough comes and goes Having SOB with mild wheezing only after coughing spells Having sinus pressure and congestion No fever, has had chills Having headaches, but no ear or throat pain She does have scratchy throat Having water eyes Some cough productive, yellow thick occ post-tussive cough No sick contacts Reports h/o frequent sinus infections Denies any h/o sinus surgeries Does not smoke Has had covid vaccine Has been using flonase  Allergies  Allergen Reactions  . Tramadol Nausea And Vomiting  . Trazodone And Nefazodone Other (See Comments)    Per pt trazodone caused hallucinations and behavior changes     Prior to Admission medications   Medication Sig Start Date End Date Taking? Authorizing Provider  QUEtiapine (SEROQUEL) 300 MG tablet TAKE 1 TABLET BY MOUTH EVERYDAY AT BEDTIME 09/13/19   Myles Lipps, MD  QUEtiapine (SEROQUEL) 50 MG tablet TAKE 1  TABLET AT BEDTIME AS NEEDED 09/13/19   Myles Lipps, MD  aspirin EC 81 MG tablet Take 1 tablet (81 mg total) by mouth daily. 07/04/19   Quintella Reichert, MD  atorvastatin (LIPITOR) 20 MG tablet Take 1 tablet (20 mg total) by mouth daily. 02/19/19   Myles Lipps, MD  buPROPion (WELLBUTRIN XL) 150 MG 24 hr tablet TAKE 1 TABLET BY MOUTH EVERY DAY 09/13/19   Myles Lipps, MD  busPIRone (BUSPAR) 7.5 MG tablet TAKE 1 TABLET BY MOUTH 3 TIMES DAILY. 08/12/19   Myles Lipps, MD  diclofenac sodium (VOLTAREN) 1 % GEL Apply 2 g topically daily as needed (for knee pain). 08/23/18   Myles Lipps, MD  DULoxetine (CYMBALTA) 60 MG capsule TAKE 1 CAPSULE EVERY DAY 09/12/19   Myles Lipps, MD  esomeprazole (NEXIUM) 40 MG capsule TAKE 1 CAPSULE EVERY DAY 09/12/19   Myles Lipps, MD  famotidine (PEPCID) 40 MG tablet  08/18/19   [provider]  fluticasone (FLONASE) 50 MCG/ACT nasal spray PLACE 2 SPRAYS AT BEDTIME INTO BOTH NOSTRILS. 08/23/18   Myles Lipps, MD  gabapentin (NEURONTIN) 600 MG tablet Take 1 tablet (600 mg total) by mouth 3 (three) times daily. 07/19/19   Myles Lipps, MD  hydrOXYzine (ATARAX/VISTARIL) 50 MG tablet TAKE 1 TABLET EVERY 6 HOURS AS NEEDED 06/29/19   Myles Lipps, MD  icosapent Ethyl (VASCEPA) 1 g capsule Take 2 capsules (2 g total) by mouth 2 (two) times daily. 07/31/19   Armanda Magic  R, MD  levothyroxine (SYNTHROID) 50 MCG tablet Take 1 tablet (50 mcg total) by mouth every other day. Before breakfast, alternated with tablet. 05/21/19   Myles Lipps, MD  levothyroxine (SYNTHROID) 75 MCG tablet Take 75 mcg by mouth every other day.    [provider]  Melatonin 10 MG TABS Take 1 tablet by mouth daily.    [provider]  ondansetron (ZOFRAN-ODT) 8 MG disintegrating tablet Take 0.5-1 tablets (4-8 mg total) by mouth every 8 (eight) hours as needed for nausea (from migraines). 11/20/18   Myles Lipps, MD  prazosin (MINIPRESS) 2 MG  capsule TAKE 2 TABLETS BY MOUTH ALONG WITH THE 5MG  TABLET TO MAKE A TOTAL OF 9MG  DOSE AT BEDTIME 07/11/19   , MD  prazosin (MINIPRESS) 5 MG capsule TAKE 2 TO 3 CAPSULES AT BEDTIME 09/16/19   Myles Lipps, MD  rizatriptan (MAXALT-MLT) 10 MG disintegrating tablet Take 1 tablet (10 mg total) by mouth as needed for migraine. May repeat in 2 hours if needed 10/14/17   Myles Lipps, MD    Past Medical History:  Diagnosis Date  . Acute encephalopathy 06/25/2016  . Anxiety   . Cataract 08/31/2017   left eye  . Cholesterol serum increased   . Chronic back pain    chronic Rt low back pain. s/p L4-5 fusion. failed Rt facet injections. poss due to Rt SI joint dysfunction.  . Chronic kidney disease   . Depression   . Diastolic heart failure (HCC)   . GERD (gastroesophageal reflux disease)   . Hyperlipidemia   . Hyperparathyroidism (HCC)   . Hypertension   . Hypothyroidism   . Migraine   . Neuromuscular disorder (HCC)   . Seizures (HCC)    3 years ago,06/20/19  . Sleep apnea    cpap  . Syncope 08/30/2015  . Tachycardia   . Trochanteric bursitis of both hips 2012   Confirmed on MRI    Past Surgical History:  Procedure Laterality Date  . ABDOMINAL HYSTERECTOMY    . APPENDECTOMY  1986  . BACK SURGERY     L4-5 fusion  . CATARACT EXTRACTION    . KNEE SURGERY    . SHOULDER ARTHROSCOPY WITH SUBACROMIAL DECOMPRESSION Left 10/20/2018   Procedure: SHOULDER ARTHROSCOPY, ROTATOR CUFF DEBRIDEMENT, GLENOHUMERAL JOINT DEBRIDEMENT, ACROMIOPLASTY, DISTAL CLAVICLE RESECTION;  Surgeon: 2013, MD;  Location: [facility];  Service: Orthopedics;  Laterality: Left;    Social History   Tobacco Use  . Smoking status: Never Smoker  . Smokeless tobacco: Never Used  Substance Use Topics  . Alcohol use: No    Family History  Problem Relation Age of Onset  . Diabetes Mother   . Heart disease Mother   . Kidney disease Mother   . Thyroid disease Mother   . Heart disease Father   . Seizures  Father   . COPD Father   . Irritable bowel syndrome Father   . Cancer Maternal Grandmother   . Diabetes Sister   . Diabetes Brother   . Colon cancer Neg Hx   . Stomach cancer Neg Hx   . Colon polyps Neg Hx   . Esophageal cancer Neg Hx   . Rectal cancer Neg Hx     ROS Per hpi  Objective  Vitals as reported by the patient: none  Gen: AAOx3, NAD Resp: coughing thru visit  ASSESSMENT and PLAN  1. Acute sinusitis, recurrence not specified, unspecified location 2. Wheezing Treating empirically for acute  sinusitis with mild RAD. Discussed testing for covid and quarantine. Discussed supportive measures and RTC precautions.   Other orders - amoxicillin-clavulanate (AUGMENTIN) 875-125 MG tablet; Take 1 tablet by mouth 2 (two) times daily. - benzonatate (TESSALON) 100 MG capsule; Take 1-2 capsules (100-200 mg total) by mouth 3 (three) times daily as needed for cough. - albuterol (VENTOLIN HFA) 108 (90 Base) MCG/ACT inhaler; Inhale 2 puffs into the lungs every 6 (six) hours as needed for wheezing or shortness of breath.  FOLLOW-UP: prn   The above assessment and management plan was discussed with the patient. The patient verbalized understanding of and has agreed to the management plan. Patient is aware to call the clinic if symptoms persist or worsen. Patient is aware when to return to the clinic for a follow-up visit. Patient educated on when it is appropriate to go to the emergency department.     Rutherford Guys, MD Primary Care at [address], [place] [address] Ph.  [phone] Fax [phone]

## 2019-10-05 NOTE — Patient Instructions (Signed)
° ° ° °  If you have lab work done today you will be contacted with your lab results within the next 2 weeks.  If you have not heard from us then please contact us. The fastest way to get your results is to register for My Chart. ° ° °IF you received an x-ray today, you will receive an invoice from [facility]. Please contact [facility] at [phone] with questions or concerns regarding your invoice.  ° °IF you received labwork today, you will receive an invoice from [facility]. Please contact [facility] at [phone] with questions or concerns regarding your invoice.  ° °Our billing staff will not be able to assist you with questions regarding bills from these companies. ° °You will be contacted with the lab results as soon as they are available. The fastest way to get your results is to activate your My Chart account. Instructions are located on the last page of this paperwork. If you have not heard from us regarding the results in 2 weeks, please contact this office. °  ° ° ° °

## 2019-10-08 ENCOUNTER — Other Ambulatory Visit: Payer: Self-pay | Admitting: Family Medicine

## 2019-10-08 DIAGNOSIS — F331 Major depressive disorder, recurrent, moderate: Secondary | ICD-10-CM

## 2019-10-08 MED ORDER — QUETIAPINE FUMARATE 300 MG PO TABS: ORAL_TABLET | ORAL | 1 refills | Status: DC

## 2019-10-08 NOTE — Telephone Encounter (Signed)
Requested medication (s) are due for refill today: no  Requested medication (s) are on the active medication list: yes  Last refill:  09/13/19 a 6 month RF  Future visit scheduled: yes  Notes to clinic:  med not delegated to refill or refuse   Requested Prescriptions  Pending Prescriptions Disp Refills   QUEtiapine (SEROQUEL) 300 MG tablet 90 tablet 1    Sig: TAKE 1 TABLET BY MOUTH EVERYDAY AT BEDTIME      Not Delegated - Psychiatry:  Antipsychotics - Second Generation (Atypical) - quetiapine Failed - 10/08/2019 12:11 PM      Failed - This refill cannot be delegated      Passed - ALT in normal range and within 180 days    ALT  Date Value Ref Range Status  05/15/2019 22 0 - 32 IU/L Final          Passed - AST in normal range and within 180 days    AST  Date Value Ref Range Status  05/15/2019 18 0 - 40 IU/L Final          Passed - Completed PHQ-2 or PHQ-9 in the last 360 days.      Passed - Last BP in normal range    BP Readings from Last 1 Encounters:  08/07/19 138/86          Passed - Valid encounter within last 6 months    Recent Outpatient Visits           3 days ago Acute sinusitis, recurrence not specified, unspecified location   [facility], Meda Coffee, MD   2 months ago Other specified hypothyroidism   [facility] at [facility], Meda Coffee, MD   4 months ago Dizziness   [facility] at [facility], Meda Coffee, MD   4 months ago Medicare annual wellness visit, subsequent   Primary Care at [facility], Manus Rudd, MD   7 months ago Acquired hypothyroidism   Primary Care at [facility], Meda Coffee, MD       Future Appointments             In 3 months Myles Lipps, MD Primary Care at [place], [facility]

## 2019-10-08 NOTE — Telephone Encounter (Signed)
QUEtiapine (SEROQUEL) 300 MG tablet     Patient is requesting refill.    Pharmacy:  [id number] - [place],  - [address] AT [facility] Phone:  [phone]  Fax:  [phone]

## 2019-10-16 ENCOUNTER — Other Ambulatory Visit: Payer: PRIVATE HEALTH INSURANCE | Admitting: *Deleted

## 2019-10-16 ENCOUNTER — Other Ambulatory Visit: Payer: Self-pay

## 2019-10-16 DIAGNOSIS — E782 Mixed hyperlipidemia: Secondary | ICD-10-CM | POA: Diagnosis not present

## 2019-10-16 LAB — LIPID PANEL
Chol/HDL Ratio: 2.4 ratio (ref 0.0–4.4)
Cholesterol, Total: 151 mg/dL (ref 100–199)
HDL: 64 mg/dL (ref >39)
LDL Chol Calc (NIH): 67 mg/dL (ref 0–99)
Triglycerides: 114 mg/dL (ref 0–149)
VLDL Cholesterol Cal: 20 mg/dL (ref 5–40)

## 2019-10-28 ENCOUNTER — Other Ambulatory Visit: Payer: Self-pay | Admitting: Family Medicine

## 2019-10-28 NOTE — Telephone Encounter (Signed)
Requested Prescriptions  Pending Prescriptions Disp Refills   albuterol (VENTOLIN HFA) 108 (90 Base) MCG/ACT inhaler [Pharmacy Med Name: ALBUTEROL HFA (PROVENTIL) INH] 8 g     Sig: TAKE 2 PUFFS BY MOUTH EVERY 6 HOURS AS NEEDED FOR WHEEZE OR SHORTNESS OF BREATH     Pulmonology:  Beta Agonists Failed - 10/28/2019  1:03 AM      Failed - One inhaler should last at least one month. If the patient is requesting refills earlier, contact the patient to check for uncontrolled symptoms.      Passed - Valid encounter within last 12 months    Recent Outpatient Visits          3 weeks ago Acute sinusitis, recurrence not specified, unspecified location   [facility] at [facility], Meda Coffee, MD   3 months ago Other specified hypothyroidism   [facility] Care at [facility], Meda Coffee, MD   5 months ago Dizziness   Primary Care at [facility], Meda Coffee, MD   5 months ago Medicare annual wellness visit, subsequent   Primary Care at [facility], Manus Rudd, MD   8 months ago Acquired hypothyroidism   Primary Care at [facility], Meda Coffee, MD      Future Appointments            In 2 weeks Myles Lipps, MD Primary Care at [place], [facility]   In 2 months Myles Lipps, MD Primary Care at [place], [facility]

## 2019-11-04 ENCOUNTER — Other Ambulatory Visit: Payer: Self-pay | Admitting: Family Medicine

## 2019-11-04 NOTE — Telephone Encounter (Signed)
Requested Prescriptions  Pending Prescriptions Disp Refills  . gabapentin (NEURONTIN) 600 MG tablet [Pharmacy Med Name: GABAPENTIN 600 MG TABLET] 270 tablet     Sig: TAKE 1 TABLET BY MOUTH THREE TIMES A DAY     Neurology: Anticonvulsants - gabapentin Passed - 11/04/2019  1:34 PM      Passed - Valid encounter within last 12 months    Recent Outpatient Visits          1 month ago Acute sinusitis, recurrence not specified, unspecified location   [facility] Care at [facility], Meda Coffee, MD   3 months ago Other specified hypothyroidism   Primary Care at [facility], Meda Coffee, MD   5 months ago Dizziness   Primary Care at [facility], Meda Coffee, MD   5 months ago Medicare annual wellness visit, subsequent   Primary Care at [facility], Manus Rudd, MD   8 months ago Acquired hypothyroidism   Primary Care at [facility], Meda Coffee, MD      Future Appointments            In 1 week Myles Lipps, MD Primary Care at [place], [facility]   In 2 months Myles Lipps, MD Primary Care at [place], [facility]           . levothyroxine (SYNTHROID) 50 MCG tablet [Pharmacy Med Name: LEVOTHYROXINE 50 MCG TABLET] 90 tablet 1    Sig: TAKE 1 TABLET BY MOUTH EVERY OTHER DAY. BEFORE BREAKFAST, ALTERNATED WITH TABLET.     Endocrinology:  Hypothyroid Agents Failed - 11/04/2019  1:34 PM      Failed - TSH needs to be rechecked within 3 months after an abnormal result. Refill until TSH is due.      Passed - TSH in normal range and within 360 days    TSH  Date Value Ref Range Status  07/16/2019 2.880 0.450 - 4.500 uIU/mL Final         Passed - Valid encounter within last 12 months    Recent Outpatient Visits          1 month ago Acute sinusitis, recurrence not specified, unspecified location   [facility] Care at [facility], Meda Coffee, MD   3 months ago Other specified hypothyroidism   Primary Care at [facility], Meda Coffee, MD   5 months ago Dizziness   Primary Care at [facility], Meda Coffee, MD    5 months ago Medicare annual wellness visit, subsequent   Primary Care at [facility], Manus Rudd, MD   8 months ago Acquired hypothyroidism   Primary Care at [facility], Meda Coffee, MD      Future Appointments            In 1 week Myles Lipps, MD Primary Care at [place], [facility]   In 2 months Myles Lipps, MD Primary Care at [place], [facility]

## 2019-11-05 ENCOUNTER — Ambulatory Visit (INDEPENDENT_AMBULATORY_CARE_PROVIDER_SITE_OTHER): Payer: Medicare Other | Admitting: Bariatrics

## 2019-11-16 ENCOUNTER — Encounter: Payer: Self-pay | Admitting: Family Medicine

## 2019-11-16 ENCOUNTER — Telehealth (INDEPENDENT_AMBULATORY_CARE_PROVIDER_SITE_OTHER): Payer: Medicare Other | Admitting: Family Medicine

## 2019-11-16 ENCOUNTER — Other Ambulatory Visit: Payer: Self-pay

## 2019-11-16 DIAGNOSIS — F5105 Insomnia due to other mental disorder: Secondary | ICD-10-CM

## 2019-11-16 DIAGNOSIS — F99 Mental disorder, not otherwise specified: Secondary | ICD-10-CM | POA: Diagnosis not present

## 2019-11-16 MED ORDER — PRAZOSIN HCL 2 MG PO CAPS: 2.0000 mg | ORAL_CAPSULE | Freq: Every evening | ORAL | 0 refills | Status: DC | PRN

## 2019-11-16 NOTE — Progress Notes (Signed)
Virtual Visit Note  I connected with patient on 11/16/19 at 203pm by video epic and verified that I am speaking with the correct person using two identifiers. Jennifer Davidson is currently located at home and patient is currently with them during visit. The provider, Myles Lipps, MD is located in their office at time of visit.  I discussed the limitations, risks, security and privacy concerns of performing an evaluation and management service by telephone and the availability of in person appointments. I also discussed with the patient that there may be a patient responsible charge related to this service. The patient expressed understanding and agreed to proceed.   I provided 6 minutes of non-face-to-face time during this encounter.  Chief Complaint  Patient presents with  . Insomnia    2 hrs a night - melatonin gives her nightmares  . Anxiety    HPI ? PMH: hypothyroidism, anxiety, depression, HLP, prediabetes,OSA on cpap  Last OV march 2021 - referred to psych She never made it to see psychiatry, just kept putting it off Feels depression has been well controlled since then For past 2-3 weeks has been having trouble with staying asleep  She normally takes an extra 2mg  prazosin if she is wakes up and is unable to go back to sleep but she ran out of medication, requesting refill She has been taking seroquel 300mg  and prazosin 5mg  at bedtime as scheduled She does also take an extra serqouel 50mg  if awakens but that by itself has not been helping She continues to take wellbutrin, cymbalta and buspar as rx She is followed by cards, last OV march 2021 She has no acute concerns today  Allergies  Allergen Reactions  . Tramadol Nausea And Vomiting  . Trazodone And Nefazodone Other (See Comments)    Per pt trazodone caused hallucinations and behavior changes     Prior to Admission medications   Medication Sig Start Date End Date Taking? Authorizing Provider  albuterol (VENTOLIN  HFA) 108 (90 Base) MCG/ACT inhaler TAKE 2 PUFFS BY MOUTH EVERY 6 HOURS AS NEEDED FOR WHEEZE OR SHORTNESS OF BREATH 10/28/19  Yes , MD  aspirin EC 81 MG tablet Take 1 tablet (81 mg total) by mouth daily. 07/04/19  Yes Turner, April 2021, MD  atorvastatin (LIPITOR) 20 MG tablet Take 1 tablet (20 mg total) by mouth daily. 02/19/19  Yes Myles Lipps, MD  buPROPion (WELLBUTRIN XL) 150 MG 24 hr tablet TAKE 1 TABLET BY MOUTH EVERY DAY 09/13/19  Yes Cornelious Bryant, MD  busPIRone (BUSPAR) 7.5 MG tablet TAKE 1 TABLET BY MOUTH 3 TIMES DAILY. 08/12/19  Yes Myles Lipps, MD  DULoxetine (CYMBALTA) 60 MG capsule TAKE 1 CAPSULE EVERY DAY 09/12/19  Yes Myles Lipps, MD  esomeprazole (NEXIUM) 40 MG capsule TAKE 1 CAPSULE EVERY DAY 09/12/19  Yes Myles Lipps, MD  fluticasone [facility]) 50 MCG/ACT nasal spray PLACE 2 SPRAYS AT BEDTIME INTO BOTH NOSTRILS. 08/23/18  Yes 09/14/19, MD  gabapentin (NEURONTIN) 600 MG tablet Take 1 tablet (600 mg total) by mouth 3 (three) times daily. 07/19/19  Yes [facility], MD  hydrOXYzine (ATARAX/VISTARIL) 50 MG tablet TAKE 1 TABLET EVERY 6 HOURS AS NEEDED 06/29/19  Yes Myles Lipps, MD  icosapent Ethyl (VASCEPA) 1 g capsule Take 2 capsules (2 g total) by mouth 2 (two) times daily. 07/31/19  Yes Turner, Myles Lipps, MD  levothyroxine (SYNTHROID) 50 MCG tablet TAKE 1 TABLET BY MOUTH EVERY OTHER  DAY. BEFORE BREAKFAST, ALTERNATED WITH TABLET. 11/04/19  Yes Myles Lipps, MD  levothyroxine (SYNTHROID) 75 MCG tablet Take 75 mcg by mouth every other day.   Yes [provider]  prazosin (MINIPRESS) 2 MG capsule TAKE 2 TABLETS BY MOUTH ALONG WITH THE 5MG  TABLET TO MAKE A TOTAL OF 9MG  DOSE AT BEDTIME 07/11/19  Yes , MD  prazosin (MINIPRESS) 5 MG capsule TAKE 2 TO 3 CAPSULES AT BEDTIME 09/16/19  Yes Myles Lipps, MD  QUEtiapine (SEROQUEL) 300 MG tablet TAKE 1 TABLET BY MOUTH EVERYDAY AT BEDTIME 10/08/19  Yes Myles Lipps, MD    QUEtiapine (SEROQUEL) 50 MG tablet TAKE 1 TABLET AT BEDTIME AS NEEDED 09/13/19  Yes Myles Lipps, MD  rizatriptan (MAXALT-MLT) 10 MG disintegrating tablet Take 1 tablet (10 mg total) by mouth as needed for migraine. May repeat in 2 hours if needed 10/14/17  Yes Myles Lipps, MD  diclofenac sodium (VOLTAREN) 1 % GEL Apply 2 g topically daily as needed (for knee pain). 08/23/18   [name], MD  famotidine (PEPCID) 40 MG tablet  08/18/19   [provider]  ondansetron (ZOFRAN-ODT) 8 MG disintegrating tablet Take 0.5-1 tablets (4-8 mg total) by mouth every 8 (eight) hours as needed for nausea (from migraines). 11/20/18   10/18/19, MD    Past Medical History:  Diagnosis Date  . Acute encephalopathy 06/25/2016  . Anxiety   . Cataract 08/31/2017   left eye  . Cholesterol serum increased   . Chronic back pain    chronic Rt low back pain. s/p L4-5 fusion. failed Rt facet injections. poss due to Rt SI joint dysfunction.  . Chronic kidney disease   . Depression   . Diastolic heart failure (HCC)   . GERD (gastroesophageal reflux disease)   . Hyperlipidemia   . Hyperparathyroidism (HCC)   . Hypertension   . Hypothyroidism   . Migraine   . Neuromuscular disorder (HCC)   . Seizures (HCC)    3 years ago,06/20/19  . Sleep apnea    cpap  . Syncope 08/30/2015  . Tachycardia   . Trochanteric bursitis of both hips 2012   Confirmed on MRI    Past Surgical History:  Procedure Laterality Date  . ABDOMINAL HYSTERECTOMY    . APPENDECTOMY  1986  . BACK SURGERY     L4-5 fusion  . CATARACT EXTRACTION    . KNEE SURGERY    . SHOULDER ARTHROSCOPY WITH SUBACROMIAL DECOMPRESSION Left 10/20/2018   Procedure: SHOULDER ARTHROSCOPY, ROTATOR CUFF DEBRIDEMENT, GLENOHUMERAL JOINT DEBRIDEMENT, ACROMIOPLASTY, DISTAL CLAVICLE RESECTION;  Surgeon: 2013, MD;  Location: [facility];  Service: Orthopedics;  Laterality: Left;    Social History   Tobacco Use  . Smoking status: Never Smoker  .  Smokeless tobacco: Never Used  Substance Use Topics  . Alcohol use: No    Family History  Problem Relation Age of Onset  . Diabetes Mother   . Heart disease Mother   . Kidney disease Mother   . Thyroid disease Mother   . Heart disease Father   . Seizures Father   . COPD Father   . Irritable bowel syndrome Father   . Cancer Maternal Grandmother   . Diabetes Sister   . Diabetes Brother   . Colon cancer Neg Hx   . Stomach cancer Neg Hx   . Colon polyps Neg Hx   . Esophageal cancer Neg Hx   . Rectal cancer Neg Hx  ROS Per hpi  Objective  Vitals as reported by the patient: none  GEN: AAOx3, NAD HEENT: [place]/AT, pupils are symmetrical, EOMI, non-icteric sclera Resp: breathing comfortably, speaking in full sentences Skin: no rashes noted, no pallor Psych: good eye contact, normal mood and affect   ASSESSMENT and PLAN  1. Insomnia due to other mental disorder Refilled prazosin, strongly encouraged patient to schedule appt with psych.  Other orders - prazosin (MINIPRESS) 2 MG capsule; Take 1 capsule (2 mg total) by mouth at bedtime as needed.  FOLLOW-UP: 3 months for hypothyroidism   The above assessment and management plan was discussed with the patient. The patient verbalized understanding of and has agreed to the management plan. Patient is aware to call the clinic if symptoms persist or worsen. Patient is aware when to return to the clinic for a follow-up visit. Patient educated on when it is appropriate to go to the emergency department.     Myles Lipps, MD Primary Care at [facility] [address] [place], [place] [address] Ph.  [phone] Fax [phone]

## 2019-11-16 NOTE — Patient Instructions (Signed)
° ° ° °  If you have lab work done today you will be contacted with your lab results within the next 2 weeks.  If you have not heard from us then please contact us. The fastest way to get your results is to register for My Chart. ° ° °IF you received an x-ray today, you will receive an invoice from [facility]. Please contact [facility] at [phone] with questions or concerns regarding your invoice.  ° °IF you received labwork today, you will receive an invoice from [facility]. Please contact [facility] at [phone] with questions or concerns regarding your invoice.  ° °Our billing staff will not be able to assist you with questions regarding bills from these companies. ° °You will be contacted with the lab results as soon as they are available. The fastest way to get your results is to activate your My Chart account. Instructions are located on the last page of this paperwork. If you have not heard from us regarding the results in 2 weeks, please contact this office. °  ° ° ° °

## 2019-11-20 ENCOUNTER — Ambulatory Visit (INDEPENDENT_AMBULATORY_CARE_PROVIDER_SITE_OTHER): Payer: Medicare Other | Admitting: Bariatrics

## 2019-12-14 ENCOUNTER — Other Ambulatory Visit: Payer: Self-pay | Admitting: Family Medicine

## 2020-01-18 ENCOUNTER — Ambulatory Visit: Payer: Medicare Other | Admitting: Family Medicine

## 2020-01-22 DIAGNOSIS — E785 Hyperlipidemia, unspecified: Secondary | ICD-10-CM | POA: Diagnosis not present

## 2020-01-22 DIAGNOSIS — G709 Myoneural disorder, unspecified: Secondary | ICD-10-CM | POA: Diagnosis not present

## 2020-01-22 DIAGNOSIS — Z9989 Dependence on other enabling machines and devices: Secondary | ICD-10-CM | POA: Diagnosis not present

## 2020-01-22 DIAGNOSIS — G4733 Obstructive sleep apnea (adult) (pediatric): Secondary | ICD-10-CM | POA: Diagnosis not present

## 2020-01-22 DIAGNOSIS — N1831 Chronic kidney disease, stage 3a: Secondary | ICD-10-CM | POA: Diagnosis not present

## 2020-01-22 DIAGNOSIS — I5030 Unspecified diastolic (congestive) heart failure: Secondary | ICD-10-CM | POA: Diagnosis not present

## 2020-01-22 DIAGNOSIS — E039 Hypothyroidism, unspecified: Secondary | ICD-10-CM | POA: Diagnosis not present

## 2020-01-22 DIAGNOSIS — I129 Hypertensive chronic kidney disease with stage 1 through stage 4 chronic kidney disease, or unspecified chronic kidney disease: Secondary | ICD-10-CM | POA: Diagnosis not present

## 2020-01-28 DIAGNOSIS — M1712 Unilateral primary osteoarthritis, left knee: Secondary | ICD-10-CM | POA: Diagnosis not present

## 2020-01-28 DIAGNOSIS — M25562 Pain in left knee: Secondary | ICD-10-CM | POA: Diagnosis not present

## 2020-01-29 ENCOUNTER — Other Ambulatory Visit: Payer: Self-pay | Admitting: Family Medicine

## 2020-01-29 NOTE — Telephone Encounter (Signed)
Requested Prescriptions  Pending Prescriptions Disp Refills  . atorvastatin (LIPITOR) 20 MG tablet [Pharmacy Med Name: ATORVASTATIN 20 MG TABLET] 90 tablet     Sig: TAKE 1 TABLET BY MOUTH EVERY DAY     Cardiovascular:  Antilipid - Statins Failed - 01/29/2020  1:17 AM      Failed - LDL in normal range and within 360 days    LDL Chol Calc (NIH)  Date Value Ref Range Status  10/16/2019 67 0 - 99 mg/dL Final         Passed - Total Cholesterol in normal range and within 360 days    Cholesterol, Total  Date Value Ref Range Status  10/16/2019 151 100 - 199 mg/dL Final         Passed - HDL in normal range and within 360 days    HDL  Date Value Ref Range Status  10/16/2019 64 >39 mg/dL Final         Passed - Triglycerides in normal range and within 360 days    Triglycerides  Date Value Ref Range Status  10/16/2019 114 0 - 149 mg/dL Final         Passed - Patient is not pregnant      Passed - Valid encounter within last 12 months    Recent Outpatient Visits          2 months ago Insomnia due to other mental disorder   [facility], Meda Coffee, MD   3 months ago Acute sinusitis, recurrence not specified, unspecified location   [facility], Meda Coffee, MD   6 months ago Other specified hypothyroidism   [facility], Meda Coffee, MD   8 months ago Dizziness   [facility], Meda Coffee, MD   8 months ago Medicare annual wellness visit, subsequent   [facility], Manus Rudd, MD      Future Appointments            In 2 weeks Myles Lipps, MD Primary Care at [place], [facility]

## 2020-02-01 DIAGNOSIS — Z961 Presence of intraocular lens: Secondary | ICD-10-CM | POA: Diagnosis not present

## 2020-02-01 DIAGNOSIS — H524 Presbyopia: Secondary | ICD-10-CM | POA: Diagnosis not present

## 2020-02-02 ENCOUNTER — Other Ambulatory Visit: Payer: Self-pay | Admitting: Family Medicine

## 2020-02-02 NOTE — Telephone Encounter (Signed)
Requested Prescriptions  Pending Prescriptions Disp Refills  . gabapentin (NEURONTIN) 600 MG tablet [Pharmacy Med Name: GABAPENTIN 600 MG TABLET] 270 tablet 1    Sig: TAKE 1 TABLET BY MOUTH THREE TIMES A DAY     Neurology: Anticonvulsants - gabapentin Passed - 02/02/2020  9:29 AM      Passed - Valid encounter within last 12 months    Recent Outpatient Visits          2 months ago Insomnia due to other mental disorder   [facility], Meda Coffee, MD   4 months ago Acute sinusitis, recurrence not specified, unspecified location   [facility], Meda Coffee, MD   6 months ago Other specified hypothyroidism   [facility], Meda Coffee, MD   8 months ago Dizziness   [facility], Meda Coffee, MD   8 months ago Medicare annual wellness visit, subsequent   [facility], Manus Rudd, MD      Future Appointments            In 2 weeks Myles Lipps, MD Primary Care at [place], [facility]

## 2020-02-06 ENCOUNTER — Other Ambulatory Visit: Payer: Self-pay | Admitting: *Deleted

## 2020-02-06 ENCOUNTER — Other Ambulatory Visit: Payer: Self-pay | Admitting: Family Medicine

## 2020-02-06 NOTE — Telephone Encounter (Signed)
Requested Prescriptions  Pending Prescriptions Disp Refills  . QUEtiapine (SEROQUEL) 50 MG tablet [Pharmacy Med Name: QUETIAPINE FUMARATE 50 MG Tablet] 90 tablet 1    Sig: TAKE 1 TABLET AT BEDTIME AS NEEDED     Not Delegated - Psychiatry:  Antipsychotics - Second Generation (Atypical) - quetiapine Failed - 02/06/2020  5:13 AM      Failed - This refill cannot be delegated      Failed - ALT in normal range and within 180 days    ALT  Date Value Ref Range Status  05/15/2019 22 0 - 32 IU/L Final         Failed - AST in normal range and within 180 days    AST  Date Value Ref Range Status  05/15/2019 18 0 - 40 IU/L Final         Passed - Completed PHQ-2 or PHQ-9 in the last 360 days.      Passed - Last BP in normal range    BP Readings from Last 1 Encounters:  08/07/19 138/86         Passed - Valid encounter within last 6 months    Recent Outpatient Visits          2 months ago Insomnia due to other mental disorder   [facility], Meda Coffee, MD   4 months ago Acute sinusitis, recurrence not specified, unspecified location   [facility], Meda Coffee, MD   6 months ago Other specified hypothyroidism   [facility], Meda Coffee, MD   8 months ago Dizziness   [facility], Meda Coffee, MD   8 months ago Medicare annual wellness visit, subsequent   Primary Care at [facility], Manus Rudd, MD      Future Appointments            Tomorrow Quintella Reichert, MD [facility], [facility]   In 1 week Myles Lipps, MD Primary Care at [place], [facility]           . DULoxetine (CYMBALTA) 60 MG capsule [Pharmacy Med Name: DULOXETINE HYDROCHLORIDE 60 MG Capsule Delayed Release Particles] 90 capsule 1    Sig: TAKE 1 CAPSULE EVERY DAY     Psychiatry: Antidepressants - SNRI Passed - 02/06/2020  5:13 AM      Passed - Completed PHQ-2 or PHQ-9 in the last 360 days.      Passed - Last BP in normal range    BP  Readings from Last 1 Encounters:  08/07/19 138/86         Passed - Valid encounter within last 6 months    Recent Outpatient Visits          2 months ago Insomnia due to other mental disorder   [facility], Meda Coffee, MD   4 months ago Acute sinusitis, recurrence not specified, unspecified location   [facility], Meda Coffee, MD   6 months ago Other specified hypothyroidism   [facility], Meda Coffee, MD   8 months ago Dizziness   [facility], Meda Coffee, MD   8 months ago Medicare annual wellness visit, subsequent   Primary Care at [facility], Manus Rudd, MD      Future Appointments            Tomorrow Quintella Reichert, MD [facility], [facility]  In 1 week Myles Lipps, MD Primary Care at [place], [facility]

## 2020-02-06 NOTE — Telephone Encounter (Signed)
Requested medications are due for refill today? Yes - This refill cannot be delegated.    Requested medications are on active medication list? Yes  Last Refill:   09/13/2019  # 90 with one refill   Future visit scheduled? Yes   Notes to Clinic:  This refill cannot be delegated.

## 2020-02-07 ENCOUNTER — Telehealth (INDEPENDENT_AMBULATORY_CARE_PROVIDER_SITE_OTHER): Payer: Medicare Other | Admitting: Cardiology

## 2020-02-07 ENCOUNTER — Other Ambulatory Visit: Payer: Self-pay

## 2020-02-07 ENCOUNTER — Encounter: Payer: Self-pay | Admitting: Cardiology

## 2020-02-07 VITALS — BP 118/70 | Ht 63.0 in | Wt 198.0 lb

## 2020-02-07 DIAGNOSIS — R0602 Shortness of breath: Secondary | ICD-10-CM | POA: Diagnosis not present

## 2020-02-07 DIAGNOSIS — I2510 Atherosclerotic heart disease of native coronary artery without angina pectoris: Secondary | ICD-10-CM

## 2020-02-07 DIAGNOSIS — R42 Dizziness and giddiness: Secondary | ICD-10-CM | POA: Diagnosis not present

## 2020-02-07 DIAGNOSIS — E782 Mixed hyperlipidemia: Secondary | ICD-10-CM

## 2020-02-07 DIAGNOSIS — I2584 Coronary atherosclerosis due to calcified coronary lesion: Secondary | ICD-10-CM | POA: Diagnosis not present

## 2020-02-07 DIAGNOSIS — I10 Essential (primary) hypertension: Secondary | ICD-10-CM

## 2020-02-07 NOTE — Patient Instructions (Signed)
Medication Instructions:  Your physician recommends that you continue on your current medications as directed. Please refer to the Current Medication list given to you today.  *If you need a refill on your cardiac medications before your next appointment, please call your pharmacy*   Lab Work: none If you have labs (blood work) drawn today and your tests are completely normal, you will receive your results only by: Marland Kitchen MyChart Message (if you have MyChart) OR . A paper copy in the mail If you have any lab test that is abnormal or we need to change your treatment, we will call you to review the results.   Testing/Procedures: none   Follow-Up: At [facility], you and your health needs are our priority.  As part of our continuing mission to provide you with exceptional heart care, we have created designated Provider Care Teams.  These Care Teams include your primary Cardiologist (physician) and Advanced Practice Providers (APPs -  Physician Assistants and Nurse Practitioners) who all work together to provide you with the care you need, when you need it.  We recommend signing up for the patient portal called "MyChart".  Sign up information is provided on this After Visit Summary.  MyChart is used to connect with patients for Virtual Visits (Telemedicine).  Patients are able to view lab/test results, encounter notes, upcoming appointments, etc.  Non-urgent messages can be sent to your provider as well.   To learn more about what you can do with MyChart, go to [email or web address].    Your next appointment:   1 year(s)  The format for your next appointment:   In Person  Provider:   You may see Armanda Magic, MD or one of the following Advanced Practice Providers on your designated Care Team:    Ronie Spies, [name]  Jacolyn Reedy, [name]    Other Instructions

## 2020-02-07 NOTE — Progress Notes (Signed)
Virtual Visit via Telephone Note   This visit type was conducted due to national recommendations for restrictions regarding the COVID-19 Pandemic (e.g. social distancing) in an effort to limit this patient's exposure and mitigate transmission in our community.  Due to her co-morbid illnesses, this patient is at least at moderate risk for complications without adequate follow up.  This format is felt to be most appropriate for this patient at this time.  The patient did not have access to video technology/had technical difficulties with video requiring transitioning to audio format only (telephone).  All issues noted in this document were discussed and addressed.  No physical exam could be performed with this format.  Please refer to the patient's chart for her  consent to telehealth for [facility].   Evaluation Performed:  Cardiology Consult  This visit type was conducted due to national recommendations for restrictions regarding the COVID-19 Pandemic (e.g. social distancing).  This format is felt to be most appropriate for this patient at this time.  All issues noted in this document were discussed and addressed.  No physical exam was performed (except for noted visual exam findings with Video Visits).  Please refer to the patient's chart (MyChart message for video visits and phone note for telephone visits) for the patient's consent to telehealth for [facility].  Date:  02/07/2020   ID:  Jennifer Davidson, DOB 05/18/1955, MRN [id number]  Patient Location:  Home  Provider location:   [place]  PCP:  Myles Lipps, MD  Cardiologist:  Armanda Magic, MD  Electrophysiologist:  None   Chief Complaint:  dizziness  History of Present Illness:    Jennifer Davidson is a 64 y.o. female who presents via audio/video conferencing for a telehealth visit today in referral by Jennifer Shiver, MD for evaluation of dizziness.    This is a 64yo female with a hx of GERD, HTN, hypothyroidism, OSA on PAP  therapy and diastolic CHF.  She has recently been having problems with dizziness and was referred for evaluation by her PCP.  She has had dizzy spells for quite some time but usually occurred when going from sitting to standing and then would resolve.    About 4 months ago she was standing in the hall talking to her husband and then all of a sudden felt like she was losing her balance and fell down.  She was not lightheaded and did not lose consciousness.  She had a syncopal spell about 2 years ago when standing out in the driveway talking to her husband and was evaluated in the ER and workup was normal.  2D echo was normal at that time but she broke 6 ribs.  That time she felt lightheaded and felt like she was going to pass out.    The episodes she is having now are different from her event in 2017.  She also had had episodes sitting down reading where suddenly room starts to spin and she gets very dizzy.  She fell again 12/27 while walking across her living room floor and she lost control and fell into an ottoman.  She had no lightheadedness and did not feel like she was going to pass out.  She occasionally will notice palpitations if she has a bad fall which she attributes to being nervous after the fall but has no palpitations prior to the event.  She has not had any nausea prior to the events but has been diaphoretic after several of her falls.  She denies any chest pain or pressure.  She does have DOE which is pretty significant to the point she gets SOB with minimal walking outside but is able to use a stationary bike without problems.  She has chronic LE edema. She has never smoked.  Her mother had a hx of CAD with CABG.    The patient does not have symptoms concerning for COVID-19 infection (fever, chills, cough, or new shortness of breath).    Prior CV studies:   The following studies were reviewed today:  2D echo 2017  Past Medical History:  Diagnosis Date  . Acute encephalopathy  06/25/2016  . Anxiety   . Cataract 08/31/2017   left eye  . Cholesterol serum increased   . Chronic back pain    chronic Rt low back pain. s/p L4-5 fusion. failed Rt facet injections. poss due to Rt SI joint dysfunction.  . Chronic kidney disease   . Depression   . Diastolic heart failure (HCC)   . GERD (gastroesophageal reflux disease)   . Hyperlipidemia   . Hyperparathyroidism (HCC)   . Hypertension   . Hypothyroidism   . Migraine   . Neuromuscular disorder (HCC)   . Seizures (HCC)    3 years ago,06/20/19  . Sleep apnea    cpap  . Syncope 08/30/2015  . Tachycardia   . Trochanteric bursitis of both hips 2012   Confirmed on MRI   Past Surgical History:  Procedure Laterality Date  . ABDOMINAL HYSTERECTOMY    . APPENDECTOMY  1986  . BACK SURGERY     L4-5 fusion  . CATARACT EXTRACTION    . KNEE SURGERY    . SHOULDER ARTHROSCOPY WITH SUBACROMIAL DECOMPRESSION Left 10/20/2018   Procedure: SHOULDER ARTHROSCOPY, ROTATOR CUFF DEBRIDEMENT, GLENOHUMERAL JOINT DEBRIDEMENT, ACROMIOPLASTY, DISTAL CLAVICLE RESECTION;  Surgeon: Jodi Geralds, MD;  Location: [facility];  Service: Orthopedics;  Laterality: Left;     Current Meds  Medication Sig  . albuterol (VENTOLIN HFA) 108 (90 Base) MCG/ACT inhaler TAKE 2 PUFFS BY MOUTH EVERY 6 HOURS AS NEEDED FOR WHEEZE OR SHORTNESS OF BREATH  . aspirin EC 81 MG tablet Take 1 tablet (81 mg total) by mouth daily.  Marland Kitchen atorvastatin (LIPITOR) 20 MG tablet TAKE 1 TABLET BY MOUTH EVERY DAY  . buPROPion (WELLBUTRIN XL) 150 MG 24 hr tablet TAKE 1 TABLET BY MOUTH EVERY DAY  . busPIRone (BUSPAR) 7.5 MG tablet TAKE 1 TABLET BY MOUTH 3 TIMES DAILY.  Marland Kitchen diclofenac sodium (VOLTAREN) 1 % GEL Apply 2 g topically daily as needed (for knee pain).  . DULoxetine (CYMBALTA) 60 MG capsule TAKE 1 CAPSULE EVERY DAY  . esomeprazole (NEXIUM) 40 MG capsule TAKE 1 CAPSULE EVERY DAY  . fluticasone (FLONASE) 50 MCG/ACT nasal spray PLACE 2 SPRAYS AT BEDTIME INTO BOTH NOSTRILS.  Marland Kitchen gabapentin  (NEURONTIN) 600 MG tablet TAKE 1 TABLET BY MOUTH THREE TIMES A DAY  . hydrOXYzine (ATARAX/VISTARIL) 50 MG tablet TAKE 1 TABLET EVERY 6 HOURS AS NEEDED  . icosapent Ethyl (VASCEPA) 1 g capsule Take 2 capsules (2 g total) by mouth 2 (two) times daily.  Marland Kitchen levothyroxine (SYNTHROID) 50 MCG tablet TAKE 1 TABLET BY MOUTH EVERY OTHER DAY. BEFORE BREAKFAST, ALTERNATED WITH TABLET.  Marland Kitchen levothyroxine (SYNTHROID) 75 MCG tablet Take 75 mcg by mouth every other day.  . ondansetron (ZOFRAN-ODT) 8 MG disintegrating tablet Take 0.5-1 tablets (4-8 mg total) by mouth every 8 (eight) hours as needed for nausea (from migraines).  . prazosin (MINIPRESS) 2 MG capsule  Take 1 capsule (2 mg total) by mouth at bedtime as needed.  . prazosin (MINIPRESS) 5 MG capsule TAKE 2 TO 3 CAPSULES AT BEDTIME  . QUEtiapine (SEROQUEL) 300 MG tablet TAKE 1 TABLET BY MOUTH EVERYDAY AT BEDTIME  . QUEtiapine (SEROQUEL) 50 MG tablet TAKE 1 TABLET AT BEDTIME AS NEEDED  . rizatriptan (MAXALT-MLT) 10 MG disintegrating tablet Take 1 tablet (10 mg total) by mouth as needed for migraine. May repeat in 2 hours if needed     Allergies:   Tramadol and Trazodone and nefazodone   Social History   Tobacco Use  . Smoking status: Never Smoker  . Smokeless tobacco: Never Used  Vaping Use  . Vaping Use: Never used  Substance Use Topics  . Alcohol use: No  . Drug use: No    Comment: 08-31-2016 PER PT NO      Family Hx: The patient's family history includes COPD in her father; Cancer in her maternal grandmother; Diabetes in her brother, mother, and sister; Heart disease in her father and mother; Irritable bowel syndrome in her father; Kidney disease in her mother; Seizures in her father; Thyroid disease in her mother. There is no history of Colon cancer, Stomach cancer, Colon polyps, Esophageal cancer, or Rectal cancer.  ROS:   Please see the history of present illness.     All other systems reviewed and are negative.   Labs/Other Tests  and Data Reviewed:    Recent Labs: 05/15/2019: ALT 22 06/04/2019: NT-Pro BNP 73 07/02/2019: BUN 10; Creatinine, Ser 1.46; Potassium 4.1; Sodium 138 07/16/2019: TSH 2.880   Recent Lipid Panel Lab Results  Component Value Date/Time   CHOL 151 10/16/2019 07:52 AM   TRIG 114 10/16/2019 07:52 AM   HDL 64 10/16/2019 07:52 AM   CHOLHDL 2.4 10/16/2019 07:52 AM   CHOLHDL 5.5 (H) 01/29/2016 10:09 AM   LDLCALC 67 10/16/2019 07:52 AM    Wt Readings from Last 3 Encounters:  02/07/20 198 lb (89.8 kg)  08/07/19 213 lb (96.6 kg)  07/19/19 214 lb 9.6 oz (97.3 kg)    2D echo 05/2019 IMPRESSIONS    1. Left ventricular ejection fraction, by visual estimation, is 60 to  65%. The left ventricle has normal function. There is mildly increased  left ventricular hypertrophy.  2. Left ventricular diastolic parameters are consistent with Grade II  diastolic dysfunction (pseudonormalization).  3. The left ventricle has no regional wall motion abnormalities.  4. Global right ventricle has normal systolic function.The right  ventricular size is normal. No increase in right ventricular wall  thickness.  5. Left atrial size was normal.  6. Right atrial size was normal.  7. Presence of pericardial fat pad.  8. Trivial pericardial effusion is present.  9. The pericardial effusion is anterior to the right ventricle.  10. The mitral valve is grossly normal. Trivial mitral valve  regurgitation.  11. The tricuspid valve is grossly normal.  12. The aortic valve is tricuspid. Aortic valve regurgitation is not  visualized.  13. The pulmonic valve was grossly normal. Pulmonic valve regurgitation is  trivial.  14. TR signal is inadequate for assessing pulmonary artery systolic  pressure.  15. The inferior vena cava is normal in size with greater than 50%  respiratory variability, suggesting right atrial pressure of 3 mmHg.   Coronary CTA 05/2019 IMPRESSION: 1. Coronary calcium score of 216. This was  89th percentile for age and sex matched control.  2. Normal coronary origin with left dominance.  3. Mild CAD (  25-49%) in the mid LAD.  4. Small pericardial effusion.  RECOMMENDATIONS: 1. Mild non-obstructive CAD (25-49%). Consider non-atherosclerotic causes of chest pain. Consider preventive therapy and risk factor modification.  Objective:    Vital Signs:  BP 118/70   Ht 5\' 3"  (1.6 m)   Wt 198 lb (89.8 kg)   BMI 35.07 kg/m     ASSESSMENT & PLAN:    1.  Dizziness -her sx in the past have really sounded more vertiginous than lightheadedness -symptoms improved after increasing fluids and liberalizing Na intake -event monitor showed occasional PACs and PVCs -she occasionally will have a dizzy spell but they have improved and are very sporadic  2.  SOB -? Due to obesity and deconditioning  -she does have CRFs including HTN, HLD and fm hx of CAD -coronary CTA 06/2019 showed a coronary Ca score of 216 with mild CAD in the mid LAD of 25-49% -2D echo showed normal LVF with EF 60-65% with G2DD and trivial pericardial effusion -she denies any chest pain but still has some DOE on occasion  3.  HTN -she has not been on any meds recently   4.  HLD -followed by her PCP -LDL goal < 70 -LDL in June was 1567 -continue Lipitor 20mg  daily and Vascepa 2gm BID  5.  ASCAD -mild by coronary CTA with 25-49% mLAD lesion -continue ASA 81mg  daily and statin  6.  Obesity -she is trying to walk and has lost 13lbs -encouraged her to continue to exercise as weight loss will continue to improve her SOB  COVID-19 Education: She has had her COVID 19 vaccinations  Patient Risk:   After full review of this patient's clinical status, I feel that they are at least moderate risk at this time.  Time:   Today, I have spent 20 minutes on telemedicine discussing medical problems including dizziness, SOB, HTN, HLD as well as reviewing patient's chart including 2D echo.  Medication  Adjustments/Labs and Tests Ordered: Current medicines are reviewed at length with the patient today.  Concerns regarding medicines are outlined above.  Tests Ordered: No orders of the defined types were placed in this encounter.  Medication Changes: No orders of the defined types were placed in this encounter.   Disposition:  Follow up prn  Signed, Armanda Magicraci Zylon Creamer, MD  02/07/2020 8:39 AM    [facility]

## 2020-02-08 ENCOUNTER — Other Ambulatory Visit: Payer: Self-pay | Admitting: Family Medicine

## 2020-02-13 DIAGNOSIS — N1831 Chronic kidney disease, stage 3a: Secondary | ICD-10-CM | POA: Diagnosis not present

## 2020-02-17 DIAGNOSIS — Z23 Encounter for immunization: Secondary | ICD-10-CM | POA: Diagnosis not present

## 2020-02-18 ENCOUNTER — Encounter: Payer: Self-pay | Admitting: Neurology

## 2020-02-18 ENCOUNTER — Other Ambulatory Visit: Payer: Self-pay

## 2020-02-18 ENCOUNTER — Encounter: Payer: Self-pay | Admitting: Family Medicine

## 2020-02-18 ENCOUNTER — Ambulatory Visit (INDEPENDENT_AMBULATORY_CARE_PROVIDER_SITE_OTHER): Payer: Medicare Other | Admitting: Family Medicine

## 2020-02-18 VITALS — BP 122/82 | HR 100 | Temp 98.2°F | Ht 63.0 in | Wt 199.4 lb

## 2020-02-18 DIAGNOSIS — G43109 Migraine with aura, not intractable, without status migrainosus: Secondary | ICD-10-CM | POA: Diagnosis not present

## 2020-02-18 DIAGNOSIS — F32A Depression, unspecified: Secondary | ICD-10-CM

## 2020-02-18 DIAGNOSIS — R7303 Prediabetes: Secondary | ICD-10-CM

## 2020-02-18 DIAGNOSIS — Z780 Asymptomatic menopausal state: Secondary | ICD-10-CM

## 2020-02-18 DIAGNOSIS — E038 Other specified hypothyroidism: Secondary | ICD-10-CM

## 2020-02-18 DIAGNOSIS — F419 Anxiety disorder, unspecified: Secondary | ICD-10-CM | POA: Diagnosis not present

## 2020-02-18 DIAGNOSIS — E782 Mixed hyperlipidemia: Secondary | ICD-10-CM

## 2020-02-18 MED ORDER — BUSPIRONE HCL 7.5 MG PO TABS
7.5000 mg | ORAL_TABLET | Freq: Three times a day (TID) | ORAL | 1 refills | Status: DC
Start: 2020-02-18 — End: 2020-06-12

## 2020-02-18 MED ORDER — RIZATRIPTAN BENZOATE 10 MG PO TBDP: 10.0000 mg | ORAL_TABLET | ORAL | 3 refills | Status: DC | PRN

## 2020-02-18 MED ORDER — BUPROPION HCL ER (XL) 150 MG PO TB24
150.0000 mg | ORAL_TABLET | Freq: Every day | ORAL | 1 refills | Status: DC
Start: 2020-02-18 — End: 2020-11-05

## 2020-02-18 NOTE — Progress Notes (Signed)
10/4/202110:50 AM  Jennifer Davidson 05/26/1955, [age] y.o., female [id number]  Chief Complaint  Patient presents with  . Migraine    wants to talk botox , pain is more than usual. Went to kidney spec last wk, labs done there, DOes  not remember which ones or doctors name  . Follow-up    still having trouble sleeping, seroquel is not working    HPI:   Patient is a 64 y.o. female with past medical history significant for hypothyroidism, anxiety, depression, HLP, prediabetes,OSA on cpapfor routine followup  Last OV July 2021 - telemedicine, no changes  Has had increase migraine frequency, about 3 x week, maxalt 20mg  tends to resolve them Has not seen headache specialist in years She does not take preventive medications for migraines Gets auras  She decided she did not want to go back to psych as she has been doing well re depression and anxiety She has found that being more active and engaged sign helps  She takes all her  meds as prescribed  She uses her cpap every night  Wt Readings from Last 3 Encounters:  02/18/20 199 lb 6.4 oz (90.4 kg)  02/07/20 198 lb (89.8 kg)  08/07/19 213 lb (96.6 kg)   BP Readings from Last 3 Encounters:  02/18/20 122/82  02/07/20 118/70  08/07/19 138/86   Lab Results  Component Value Date   HGBA1C 6.0 (H) 05/15/2019   HGBA1C 5.7 (H) 10/26/2018   HGBA1C 5.6 03/29/2018   Lab Results  Component Value Date   MICROALBUR 0.50 02/04/2012   LDLCALC 67 10/16/2019   CREATININE 1.46 (H) 07/02/2019    Depression screen PHQ 2/9 11/16/2019 07/19/2019 05/21/2019  Decreased Interest 1 0 0  Down, Depressed, Hopeless 1 0 0  PHQ - 2 Score 2 0 0  Altered sleeping 3 - -  Tired, decreased energy 1 - -  Change in appetite 1 - -  Feeling bad or failure about yourself  0 - -  Trouble concentrating 0 - -  Moving slowly or fidgety/restless 1 - -  Suicidal thoughts 0 - -  PHQ-9 Score 8 - -  Difficult doing work/chores Not difficult at all - -  Some recent data  might be hidden    Fall Risk  11/16/2019 10/05/2019 07/19/2019 05/21/2019 05/16/2019  Falls in the past year? 0 0 0 1 1  Comment - - - - loose balance / did not go to hospital  Number falls in past yr: 0 0 0 1 1  Injury with Fall? 0 0 0 1 1  Comment - - - rt leg, lt arm and lt eye -  Risk Factor Category  - - - - -  Risk for fall due to : - - - History of fall(s);Impaired balance/gait History of fall(s)  Risk for fall due to: Comment - - - - -  Follow up Falls evaluation completed - - Falls evaluation completed Falls evaluation completed;Education provided     Allergies  Allergen Reactions  . Tramadol Nausea And Vomiting  . Trazodone And Nefazodone Other (See Comments)    Per pt trazodone caused hallucinations and behavior changes     Prior to Admission medications   Medication Sig Start Date End Date Taking? Authorizing Provider  albuterol (VENTOLIN HFA) 108 (90 Base) MCG/ACT inhaler TAKE 2 PUFFS BY MOUTH EVERY 6 HOURS AS NEEDED FOR WHEEZE OR SHORTNESS OF BREATH 10/28/19  Yes Myles LippsSantiago, Lindberg Zenon M, MD  aspirin EC 81 MG tablet Take 1 tablet (  81 mg total) by mouth daily. 07/04/19  Yes Turner, Traci R, MD  atorvastatin (LIPITOR) 20 MG tablet TAKE 1 TABLET BY MOUTH EVERY DAY 01/29/20  Yes Myles Lipps, MD  buPROPion (WELLBUTRIN XL) 150 MG 24 hr tablet TAKE 1 TABLET BY MOUTH EVERY DAY 09/13/19  Yes Myles Lipps, MD  busPIRone (BUSPAR) 7.5 MG tablet TAKE 1 TABLET BY MOUTH 3 TIMES DAILY. 08/12/19  Yes Myles Lipps, MD  diclofenac sodium (VOLTAREN) 1 % GEL Apply 2 g topically daily as needed (for knee pain). 08/23/18  Yes Myles Lipps, MD  DULoxetine (CYMBALTA) 60 MG capsule TAKE 1 CAPSULE EVERY DAY 02/06/20  Yes Myles Lipps, MD  esomeprazole (NEXIUM) 40 MG capsule TAKE 1 CAPSULE EVERY DAY 09/12/19  Yes Myles Lipps, MD  fluticasone [facility]) 50 MCG/ACT nasal spray PLACE 2 SPRAYS AT BEDTIME INTO BOTH NOSTRILS. 08/23/18  Yes Myles Lipps, MD  gabapentin (NEURONTIN) 600 MG tablet TAKE  1 TABLET BY MOUTH THREE TIMES A DAY 02/02/20  Yes Myles Lipps, MD  hydrOXYzine (ATARAX/VISTARIL) 50 MG tablet TAKE 1 TABLET EVERY 6 HOURS AS NEEDED 06/29/19  Yes Myles Lipps, MD  icosapent Ethyl (VASCEPA) 1 g capsule Take 2 capsules (2 g total) by mouth 2 (two) times daily. 07/31/19  Yes Turner, Cornelious Bryant, MD  levothyroxine (SYNTHROID) 50 MCG tablet TAKE 1 TABLET BY MOUTH EVERY OTHER DAY. BEFORE BREAKFAST, ALTERNATED WITH TABLET. 11/04/19  Yes Myles Lipps, MD  levothyroxine (SYNTHROID) 75 MCG tablet Take 75 mcg by mouth every other day.   Yes [provider]  prazosin (MINIPRESS) 2 MG capsule TAKE 1 CAPSULE BY MOUTH AT BEDTIME AS NEEDED. 02/08/20  Yes Myles Lipps, MD  prazosin (MINIPRESS) 5 MG capsule TAKE 2 TO 3 CAPSULES AT BEDTIME 09/16/19  Yes Myles Lipps, MD  QUEtiapine (SEROQUEL) 300 MG tablet TAKE 1 TABLET BY MOUTH EVERYDAY AT BEDTIME 10/08/19  Yes Myles Lipps, MD  QUEtiapine (SEROQUEL) 50 MG tablet TAKE 1 TABLET AT BEDTIME AS NEEDED 02/06/20  Yes Myles Lipps, MD  rizatriptan (MAXALT-MLT) 10 MG disintegrating tablet Take 1 tablet (10 mg total) by mouth as needed for migraine. May repeat in 2 hours if needed 10/14/17  Yes [name], MD  QUEtiapine (SEROQUEL) 50 MG tablet TAKE 1 TABLET AT BEDTIME AS NEEDED 09/13/19   Myles Lipps, MD    Past Medical History:  Diagnosis Date  . Acute encephalopathy 06/25/2016  . Anxiety   . Cataract 08/31/2017   left eye  . Cholesterol serum increased   . Chronic back pain    chronic Rt low back pain. s/p L4-5 fusion. failed Rt facet injections. poss due to Rt SI joint dysfunction.  . Chronic kidney disease   . Depression   . Diastolic heart failure (HCC)   . GERD (gastroesophageal reflux disease)   . Hyperlipidemia   . Hyperparathyroidism (HCC)   . Hypertension   . Hypothyroidism   . Migraine   . Neuromuscular disorder (HCC)   . Seizures (HCC)    3 years ago,06/20/19  . Sleep apnea    cpap  . Syncope  08/30/2015  . Tachycardia   . Trochanteric bursitis of both hips 2012   Confirmed on MRI    Past Surgical History:  Procedure Laterality Date  . ABDOMINAL HYSTERECTOMY    . APPENDECTOMY  1986  . BACK SURGERY     L4-5 fusion  . CATARACT EXTRACTION    .  KNEE SURGERY    . SHOULDER ARTHROSCOPY WITH SUBACROMIAL DECOMPRESSION Left 10/20/2018   Procedure: SHOULDER ARTHROSCOPY, ROTATOR CUFF DEBRIDEMENT, GLENOHUMERAL JOINT DEBRIDEMENT, ACROMIOPLASTY, DISTAL CLAVICLE RESECTION;  Surgeon: Jodi Geralds, MD;  Location: [facility];  Service: Orthopedics;  Laterality: Left;    Social History   Tobacco Use  . Smoking status: Never Smoker  . Smokeless tobacco: Never Used  Substance Use Topics  . Alcohol use: No    Family History  Problem Relation Age of Onset  . Diabetes Mother   . Heart disease Mother   . Kidney disease Mother   . Thyroid disease Mother   . Heart disease Father   . Seizures Father   . COPD Father   . Irritable bowel syndrome Father   . Cancer Maternal Grandmother   . Diabetes Sister   . Diabetes Brother   . Colon cancer Neg Hx   . Stomach cancer Neg Hx   . Colon polyps Neg Hx   . Esophageal cancer Neg Hx   . Rectal cancer Neg Hx     Review of Systems  Constitutional: Negative for chills and fever.  Respiratory: Negative for cough and shortness of breath.   Cardiovascular: Negative for chest pain, palpitations and leg swelling.  Gastrointestinal: Negative for abdominal pain, nausea and vomiting.     OBJECTIVE:  Today's Vitals   02/18/20 1024  BP: 122/82  Pulse: 100  Temp: 98.2 F (36.8 C)  SpO2: 96%  Weight: 199 lb 6.4 oz (90.4 kg)  Height: 5\' 3"  (1.6 m)   Body mass index is 35.32 kg/m.   Physical Exam Vitals and nursing note reviewed.  Constitutional:      Appearance: She is well-developed.  HENT:     Head: Normocephalic and atraumatic.     Mouth/Throat:     Pharynx: No oropharyngeal exudate.  Eyes:     General: No scleral icterus.     Extraocular Movements: Extraocular movements intact.     Conjunctiva/sclera: Conjunctivae normal.     Pupils: Pupils are equal, round, and reactive to light.  Cardiovascular:     Rate and Rhythm: Normal rate and regular rhythm.     Heart sounds: Normal heart sounds. No murmur heard.  No friction rub. No gallop.   Pulmonary:     Effort: Pulmonary effort is normal.     Breath sounds: Normal breath sounds. No wheezing, rhonchi or rales.  Musculoskeletal:     Cervical back: Neck supple.  Skin:    General: Skin is warm and dry.  Neurological:     Mental Status: She is alert and oriented to person, place, and time.     No results found for this or any previous visit (from the past 24 hour(s)).  No results found.   ASSESSMENT and PLAN  1. Migraine with aura and without status migrainosus, not intractable Not well controlled. Referring to neuro for further eval and treatment - Ambulatory referral to Neurology  2. Other specified hypothyroidism Checking labs today, medications will be adjusted as needed.  - TSH  3. Anxiety and depression Stable on complex regime, referring to further eval and treatment - Ambulatory referral to Psychiatry  4. Mixed hyperlipidemia Checking labs today, medications will be adjusted as needed.  - Comprehensive metabolic panel - Lipid panel  5. Prediabetes Labs pending, cont LFM - Hemoglobin A1c  6. Postmenopausal estrogen deficiency - DG Bone Density; Future  Other orders - rizatriptan (MAXALT-MLT) 10 MG disintegrating tablet; Take 1 tablet (10 mg total) by  mouth as needed for migraine. May repeat in 2 hours if needed - buPROPion (WELLBUTRIN XL) 150 MG 24 hr tablet; Take 1 tablet (150 mg total) by mouth daily. - busPIRone (BUSPAR) 7.5 MG tablet; Take 1 tablet (7.5 mg total) by mouth 3 (three) times daily.  Return in about 6 months (around 08/18/2020).    Myles Lipps, MD Primary Care at [facility] [address] [place], [place]  [address] Ph.  [phone] Fax [phone]

## 2020-02-18 NOTE — Patient Instructions (Signed)
° ° ° °  If you have lab work done today you will be contacted with your lab results within the next 2 weeks.  If you have not heard from us then please contact us. The fastest way to get your results is to register for My Chart. ° ° °IF you received an x-ray today, you will receive an invoice from [facility]. Please contact [facility] at [phone] with questions or concerns regarding your invoice.  ° °IF you received labwork today, you will receive an invoice from [facility]. Please contact [facility] at [phone] with questions or concerns regarding your invoice.  ° °Our billing staff will not be able to assist you with questions regarding bills from these companies. ° °You will be contacted with the lab results as soon as they are available. The fastest way to get your results is to activate your My Chart account. Instructions are located on the last page of this paperwork. If you have not heard from us regarding the results in 2 weeks, please contact this office. °  ° ° ° °

## 2020-02-19 LAB — COMPREHENSIVE METABOLIC PANEL
ALT: 22 IU/L (ref 0–32)
AST: 23 IU/L (ref 0–40)
Albumin/Globulin Ratio: 1.6 (ref 1.2–2.2)
Albumin: 4.4 g/dL (ref 3.8–4.8)
Alkaline Phosphatase: 127 IU/L — ABNORMAL HIGH (ref 44–121)
BUN/Creatinine Ratio: 11 — ABNORMAL LOW (ref 12–28)
BUN: 17 mg/dL (ref 8–27)
Bilirubin Total: 0.6 mg/dL (ref 0.0–1.2)
CO2: 25 mmol/L (ref 20–29)
Calcium: 10.3 mg/dL (ref 8.7–10.3)
Chloride: 95 mmol/L — ABNORMAL LOW (ref 96–106)
Creatinine, Ser: 1.49 mg/dL — ABNORMAL HIGH (ref 0.57–1.00)
GFR calc Af Amer: 42 mL/min/{1.73_m2} — ABNORMAL LOW (ref >59)
GFR calc non Af Amer: 36 mL/min/{1.73_m2} — ABNORMAL LOW (ref >59)
Globulin, Total: 2.8 g/dL (ref 1.5–4.5)
Glucose: 108 mg/dL — ABNORMAL HIGH (ref 65–99)
Potassium: 4.6 mmol/L (ref 3.5–5.2)
Sodium: 136 mmol/L (ref 134–144)
Total Protein: 7.2 g/dL (ref 6.0–8.5)

## 2020-02-19 LAB — LIPID PANEL
Chol/HDL Ratio: 2.6 ratio (ref 0.0–4.4)
Cholesterol, Total: 185 mg/dL (ref 100–199)
HDL: 70 mg/dL (ref >39)
LDL Chol Calc (NIH): 89 mg/dL (ref 0–99)
Triglycerides: 154 mg/dL — ABNORMAL HIGH (ref 0–149)
VLDL Cholesterol Cal: 26 mg/dL (ref 5–40)

## 2020-02-19 LAB — HEMOGLOBIN A1C
Est. average glucose Bld gHb Est-mCnc: 131 mg/dL
Hgb A1c MFr Bld: 6.2 % — ABNORMAL HIGH (ref 4.8–5.6)

## 2020-02-19 LAB — TSH: TSH: 1.53 u[IU]/mL (ref 0.450–4.500)

## 2020-02-20 ENCOUNTER — Other Ambulatory Visit: Payer: Self-pay | Admitting: Family Medicine

## 2020-02-20 MED ORDER — GABAPENTIN 400 MG PO CAPS: 400.0000 mg | ORAL_CAPSULE | Freq: Every day | ORAL | Status: DC

## 2020-02-20 NOTE — Progress Notes (Signed)
Per renal's most recent note: gabapentin 600mg  TID is not renally dosed Appropriate dose is 400mg  daily This was discussed with patient and she agreed on weaning down Next renewal of gabapentin needs to be discussed Changed dose in system (did not send rx) as reminder for next office visit

## 2020-02-26 ENCOUNTER — Other Ambulatory Visit: Payer: Self-pay | Admitting: Family Medicine

## 2020-02-26 MED ORDER — METFORMIN HCL 500 MG PO TABS: 500.0000 mg | ORAL_TABLET | Freq: Every day | ORAL | 1 refills | Status: DC

## 2020-03-18 ENCOUNTER — Encounter: Payer: Self-pay | Admitting: Neurology

## 2020-03-18 DIAGNOSIS — M25511 Pain in right shoulder: Secondary | ICD-10-CM | POA: Diagnosis not present

## 2020-03-18 DIAGNOSIS — M25512 Pain in left shoulder: Secondary | ICD-10-CM | POA: Diagnosis not present

## 2020-03-27 ENCOUNTER — Other Ambulatory Visit: Payer: Self-pay

## 2020-03-27 ENCOUNTER — Encounter: Payer: Self-pay | Admitting: Neurology

## 2020-03-27 ENCOUNTER — Telehealth: Payer: Self-pay | Admitting: Neurology

## 2020-03-27 ENCOUNTER — Ambulatory Visit (INDEPENDENT_AMBULATORY_CARE_PROVIDER_SITE_OTHER): Payer: Medicare Other | Admitting: Neurology

## 2020-03-27 VITALS — BP 107/57 | HR 110 | Ht 63.0 in | Wt 197.0 lb

## 2020-03-27 DIAGNOSIS — G43009 Migraine without aura, not intractable, without status migrainosus: Secondary | ICD-10-CM | POA: Diagnosis not present

## 2020-03-27 DIAGNOSIS — I2510 Atherosclerotic heart disease of native coronary artery without angina pectoris: Secondary | ICD-10-CM | POA: Diagnosis not present

## 2020-03-27 DIAGNOSIS — I2584 Coronary atherosclerosis due to calcified coronary lesion: Secondary | ICD-10-CM | POA: Diagnosis not present

## 2020-03-27 NOTE — Telephone Encounter (Signed)
LMOVM for pt to call back and let us know which one of the injectable her insurance will cover per her discussion at visit today with Dr.Jaffe.

## 2020-03-27 NOTE — Telephone Encounter (Signed)
Patient left message with accessnurse stating she is needing her medication called in.

## 2020-03-27 NOTE — Progress Notes (Signed)
NEUROLOGY CONSULTATION NOTE  KIMBERLYANN HOLLAR MRN: [id number] DOB: 31-Jul-1955  Referring provider: Koren Shiver, MD Primary care provider: Koren Shiver, MD  Reason for consult:  headaches   Subjective:  Jennifer Davidson is a 64 year old right-handed female with hypothyroidism, depression, anxiety, OSA who presents for headaches.  History supplemented by referring provider's notes and prior neurologist's notes.  Migraines since 64 years old.  Over the last 3 months, they have increased in frequency.  No known aggravating factor.  They are are usually severe occipital pounding headaches associated with seeing black spots, nausea, vomiting, photophobia, phonophobia but no speech disturbance, numbness or weakness.  They last 6 to 12 hours. They occur about 3 days a week.  No known triggers.  Laying down in dark quiet room helps.    She has longstanding history of seizure-like episodes with left sided jerking or confusion.  She has been previously worked up by Scientist, product/process development, Dr. Karel Jarvis, and her events have been determined to be psychogenic.  Long-term EEG monitoring captured at least 3 events, which were normal.    Current NSAIDS/analgesics:  ASA 81mg  daily Current triptans:  none Current ergotamine:  none Current anti-emetic:  none Current muscle relaxants:  none Current Antihypertensive medications:  prazosin Current Antidepressant medications:  Cymbalta 60mg  daily, Wellbutrin XL 150mg  Current Anticonvulsant medications:  Gabapentin 400mg  QHS Current anti-CGRP:  none Current Vitamins/Herbal/Supplements:  none Current Antihistamines/Decongestants:  none Other therapy:  none Hormone/birth control:  none Other medications:  Seroquel, Synthroid  Past NSAIDS/analgesics:  Ibuprofen, naproxen, Excedrin Migraine Past abortive triptans:  Sumatriptan 50mg , Maxalt-MLT 10mg  Past abortive ergotamine:  none Past muscle relaxants:  Flexeril, Robaxin, Zanflex Past anti-emetic:   Zofran Past antihypertensive medications:  Propranolol, Lasix Past antidepressant medications:  Amitriptyline 75mg , citalopram Past anticonvulsant medications:  topiramate Past anti-CGRP:  none Past vitamins/Herbal/Supplements:  Melatonin, MVI Past antihistamines/decongestants:  Meclizine, Allegra Other past therapies:  none  Prior imaging of brain performed, personally reviewed: 02/12/2012:  Normal CT head. 08/09/2016 MRI BRAIN WO:  Negative MRI of brain for age.  No intracranial abnormality. 06/23/2017 CTA HEAD & NECK:  Minor atheromatous change at the carotid bifurcations. No flow-limiting extracranial or intracranial stenosis or dissection.  No acute intracranial findings. No abnormal postcontrast enhancement. 06/24/2017 MRI BRAIN WO:  Stable since 2016 and normal for age noncontrast MRI appearance of the brain.  Caffeine:  No coffee.  1 Diet Dr. a day Diet:  Drinks water and Powerade.  Sometimes skips meals Exercise:  Walks  But not routine Depression:  yes; Anxiety:  yes Other pain:  Some shoulder pain. Sleep hygiene:  Poor.  Trouble staying asleep.  Has CPAP.   Family history of headache:  No    PAST MEDICAL HISTORY: Past Medical History:  Diagnosis Date  . Acute encephalopathy 06/25/2016  . Anxiety   . Cataract 08/31/2017   left eye  . Cholesterol serum increased   . Chronic back pain    chronic Rt low back pain. s/p L4-5 fusion. failed Rt facet injections. poss due to Rt SI joint dysfunction.  . Chronic kidney disease   . Depression   . Diastolic heart failure (HCC)   . GERD (gastroesophageal reflux disease)   . Hyperlipidemia   . Hyperparathyroidism (HCC)   . Hypertension   . Hypothyroidism   . Migraine   . Neuromuscular disorder (HCC)   . Seizures (HCC)    3 years ago,06/20/19  . Sleep apnea    cpap  .  Syncope 08/30/2015  . Tachycardia   . Trochanteric bursitis of both hips 2012   Confirmed on MRI    PAST SURGICAL HISTORY: Past Surgical History:    Procedure Laterality Date  . ABDOMINAL HYSTERECTOMY    . APPENDECTOMY  1986  . BACK SURGERY     L4-5 fusion  . CATARACT EXTRACTION    . KNEE SURGERY    . SHOULDER ARTHROSCOPY WITH SUBACROMIAL DECOMPRESSION Left 10/20/2018   Procedure: SHOULDER ARTHROSCOPY, ROTATOR CUFF DEBRIDEMENT, GLENOHUMERAL JOINT DEBRIDEMENT, ACROMIOPLASTY, DISTAL CLAVICLE RESECTION;  Surgeon: Jodi GeraldsGraves, John, MD;  Location: [facility];  Service: Orthopedics;  Laterality: Left;    MEDICATIONS: Current Outpatient Medications on File Prior to Visit  Medication Sig Dispense Refill  . albuterol (VENTOLIN HFA) 108 (90 Base) MCG/ACT inhaler TAKE 2 PUFFS BY MOUTH EVERY 6 HOURS AS NEEDED FOR WHEEZE OR SHORTNESS OF BREATH 8 g 1  . aspirin EC 81 MG tablet Take 1 tablet (81 mg total) by mouth daily. 90 tablet 3  . atorvastatin (LIPITOR) 20 MG tablet TAKE 1 TABLET BY MOUTH EVERY DAY 90 tablet 2  . buPROPion (WELLBUTRIN XL) 150 MG 24 hr tablet Take 1 tablet (150 mg total) by mouth daily. 90 tablet 1  . busPIRone (BUSPAR) 7.5 MG tablet Take 1 tablet (7.5 mg total) by mouth 3 (three) times daily. 270 tablet 1  . diclofenac sodium (VOLTAREN) 1 % GEL Apply 2 g topically daily as needed (for knee pain). 100 g 3  . DULoxetine (CYMBALTA) 60 MG capsule TAKE 1 CAPSULE EVERY DAY 90 capsule 1  . esomeprazole (NEXIUM) 40 MG capsule TAKE 1 CAPSULE EVERY DAY 90 capsule 3  . fluticasone (FLONASE) 50 MCG/ACT nasal spray PLACE 2 SPRAYS AT BEDTIME INTO BOTH NOSTRILS. 16 g 5  . gabapentin (NEURONTIN) 400 MG capsule Take 1 capsule (400 mg total) by mouth at bedtime.    . hydrOXYzine (ATARAX/VISTARIL) 50 MG tablet TAKE 1 TABLET EVERY 6 HOURS AS NEEDED 360 tablet 2  . icosapent Ethyl (VASCEPA) 1 g capsule Take 2 capsules (2 g total) by mouth 2 (two) times daily. 120 capsule 11  . levothyroxine (SYNTHROID) 50 MCG tablet TAKE 1 TABLET BY MOUTH EVERY OTHER DAY. BEFORE BREAKFAST, ALTERNATED WITH 75MCG TABLET. 90 tablet 1  . levothyroxine (SYNTHROID) 75 MCG tablet  Take 75 mcg by mouth every other day.    . metFORMIN (GLUCOPHAGE) 500 MG tablet Take 1 tablet (500 mg total) by mouth daily with breakfast. 90 tablet 1  . prazosin (MINIPRESS) 2 MG capsule TAKE 1 CAPSULE BY MOUTH AT BEDTIME AS NEEDED. 90 capsule 0  . prazosin (MINIPRESS) 5 MG capsule TAKE 2 TO 3 CAPSULES AT BEDTIME 270 capsule 1  . QUEtiapine (SEROQUEL) 300 MG tablet TAKE 1 TABLET BY MOUTH EVERYDAY AT BEDTIME 90 tablet 1  . QUEtiapine (SEROQUEL) 50 MG tablet TAKE 1 TABLET AT BEDTIME AS NEEDED 90 tablet 1  . rizatriptan (MAXALT-MLT) 10 MG disintegrating tablet Take 1 tablet (10 mg total) by mouth as needed for migraine. May repeat in 2 hours if needed 30 tablet 3  . [DISCONTINUED] QUEtiapine (SEROQUEL) 50 MG tablet TAKE 1 TABLET AT BEDTIME AS NEEDED 90 tablet 1   No current facility-administered medications on file prior to visit.    ALLERGIES: Allergies  Allergen Reactions  . Tramadol Nausea And Vomiting  . Trazodone And Nefazodone Other (See Comments)    Per pt trazodone caused hallucinations and behavior changes     FAMILY HISTORY: Family History  Problem Relation Age  of Onset  . Diabetes Mother   . Heart disease Mother   . Kidney disease Mother   . Thyroid disease Mother   . Heart disease Father   . Seizures Father   . COPD Father   . Irritable bowel syndrome Father   . Cancer Maternal Grandmother   . Diabetes Sister   . Diabetes Brother   . Colon cancer Neg Hx   . Stomach cancer Neg Hx   . Colon polyps Neg Hx   . Esophageal cancer Neg Hx   . Rectal cancer Neg Hx     SOCIAL HISTORY: Social History   Socioeconomic History  . Marital status: Married    Spouse name: Not on file  . Number of children: 3  . Years of education: 60  . Highest education level: Some college, no degree  Occupational History  . Not on file  Tobacco Use  . Smoking status: Never Smoker  . Smokeless tobacco: Never Used  Vaping Use  . Vaping Use: Never used  Substance and Sexual Activity   . Alcohol use: No  . Drug use: No    Comment: 08-31-2016 PER PT NO   . Sexual activity: Yes    Partners: Male    Comment: married  Other Topics Concern  . Not on file  Social History Narrative  . Not on file   Social Determinants of Health   Financial Resource Strain:   . Difficulty of Paying Living Expenses: Not on file  Food Insecurity:   . Worried About Programme researcher, broadcasting/film/video in the Last Year: Not on file  . Ran Out of Food in the Last Year: Not on file  Transportation Needs:   . Lack of Transportation (Medical): Not on file  . Lack of Transportation (Non-Medical): Not on file  Physical Activity:   . Days of Exercise per Week: Not on file  . Minutes of Exercise per Session: Not on file  Stress:   . Feeling of Stress : Not on file  Social Connections:   . Frequency of Communication with Friends and Family: Not on file  . Frequency of Social Gatherings with Friends and Family: Not on file  . Attends Religious Services: Not on file  . Active Member of Clubs or Organizations: Not on file  . Attends Banker Meetings: Not on file  . Marital Status: Not on file  Intimate Partner Violence:   . Fear of Current or Ex-Partner: Not on file  . Emotionally Abused: Not on file  . Physically Abused: Not on file  . Sexually Abused: Not on file    Objective:  Blood pressure (!) 107/57, pulse (!) 110, height 5\' 3"  (1.6 m), weight 197 lb (89.4 kg), SpO2 97 %. General: No acute distress.  Patient appears well-groomed.   Head:  Normocephalic/atraumatic Eyes:  fundi examined but not visualized Neck: supple, no paraspinal tenderness, full range of motion Back: No paraspinal tenderness Heart: regular rate and rhythm Lungs: Clear to auscultation bilaterally. Vascular: No carotid bruits. Neurological Exam: Mental status: alert and oriented to person, place, and time, recent and remote memory intact, fund of knowledge intact, attention and concentration intact, speech fluent and  not dysarthric, language intact. Cranial nerves: CN I: not tested CN II: pupils equal, round and reactive to light, visual fields intact CN III, IV, VI:  full range of motion, no nystagmus, no ptosis CN V: facial sensation intact. CN VII: upper and lower face symmetric CN VIII: hearing intact CN  IX, X: gag intact, uvula midline CN XI: sternocleidomastoid and trapezius muscles intact CN XII: tongue midline Bulk & Tone: normal, no fasciculations. Motor:  muscle strength 5/5 throughout Sensation:  Pinprick, temperature and vibratory sensation intact. Deep Tendon Reflexes:  2+ throughout,  toes downgoing.   Finger to nose testing:  Without dysmetria.   Heel to shin:  Without dysmetria.   Gait:  Normal station and stride.  Romberg negative.  Assessment/Plan:   Migraine without aura, without status migrainosus, not intractable  1.  She has already tried propranolol (low blood pressure), amitriptyline and topiramate.  She already is taking Cymbalta.  Ideally, I would like her to try a CGRP inhibitor.  I have asked that she contact [employer] to see how much the following medications would cost her if approved:  Aimovig, Emgality, Ajovy.  If not an option, we may start zonisamide or retry topiramate. 2.  She will try sample of Nurtec for rescue.   3.  Limit use of pain relievers to no more than 2 days out of week to prevent risk of rebound or medication-overuse headache. 4.  Keep headache diary 5.  Follow up in 6 months.    Thank you for allowing me to take part in the care of this patient.  Shon Millet, DO  CC: Koren Shiver, MD

## 2020-03-27 NOTE — Patient Instructions (Signed)
  1. Ask you insurance how much it would cost for the following medications if approved:  Aimovig, Emgality, Ajovy 2. Take Nurtec at earliest onset of headache.  Maximum 1 tablet in 24 hours.  If effective, contact me for prescription. 3. Limit use of pain relievers to no more than 2 days out of the week.  These medications include acetaminophen, NSAIDs (ibuprofen/Advil/Motrin, naproxen/Aleve, triptans (Imitrex/sumatriptan), Excedrin, and narcotics.  This will help reduce risk of rebound headaches. 4. Be aware of common food triggers:  - Caffeine:  coffee, black tea, cola, Mt. Dew  - Chocolate  - Dairy:  aged cheeses (brie, blue, cheddar, gouda, [place], provolone, [place], Swiss, etc), chocolate milk, buttermilk, sour cream, limit eggs and yogurt  - Nuts, peanut butter  - Alcohol  - Cereals/grains:  FRESH breads (fresh bagels, sourdough, doughnuts), yeast productions  - Processed/canned/aged/cured meats (pre-packaged deli meats, hotdogs)  - MSG/glutamate:  soy sauce, flavor enhancer, pickled/preserved/marinated foods  - Sweeteners:  aspartame (Equal, Nutrasweet).  Sugar and Splenda are okay  - Vegetables:  legumes (lima beans, lentils, snow peas, fava beans, pinto peans, peas, garbanzo beans), sauerkraut, onions, olives, pickles  - Fruit:  avocados, bananas, citrus fruit (orange, lemon, grapefruit), mango  - Other:  Frozen meals, macaroni and cheese 5. Routine exercise 6. Stay adequately hydrated (aim for 64 oz water daily) 7. Keep headache diary 8. Maintain proper stress management 9. Maintain proper sleep hygiene 10. Do not skip meals 11. Consider supplements:  magnesium citrate 400mg  daily, riboflavin 400mg  daily, coenzyme Q10 100mg  three times daily.

## 2020-04-01 DIAGNOSIS — M67911 Unspecified disorder of synovium and tendon, right shoulder: Secondary | ICD-10-CM | POA: Diagnosis not present

## 2020-04-01 DIAGNOSIS — M67912 Unspecified disorder of synovium and tendon, left shoulder: Secondary | ICD-10-CM | POA: Diagnosis not present

## 2020-04-02 ENCOUNTER — Encounter: Payer: Self-pay | Admitting: Neurology

## 2020-04-02 NOTE — Progress Notes (Signed)
Received fax forms from insurance for [place]; sent back same day 11/15

## 2020-04-08 ENCOUNTER — Encounter: Payer: Self-pay | Admitting: Neurology

## 2020-04-08 ENCOUNTER — Other Ambulatory Visit: Payer: Self-pay | Admitting: Neurology

## 2020-04-08 MED ORDER — AIMOVIG 70 MG/ML ~~LOC~~ SOAJ: 70.0000 mg | SUBCUTANEOUS | 5 refills | Status: DC

## 2020-04-08 NOTE — Progress Notes (Signed)
Jennifer Davidson (Key: [id number]) Rx #: [id number] Aimovig 70MG /ML auto-injectors   Form [employer] Electronic PA Form Created 22 minutes ago Sent to Plan 18 minutes ago Plan Response 18 minutes ago Submit Clinical Questions 13 minutes ago Determination Favorable 2 minutes ago Message from Plan PA Case: , Status: Approved, Coverage Starts on: 04/08/2020 12:00:00 AM, Coverage Ends on: 07/07/2020 12:00:00 AM. Questions? Contact [phone].

## 2020-04-08 NOTE — Telephone Encounter (Signed)
LVM for pt.

## 2020-04-08 NOTE — Progress Notes (Signed)
Subjective:   Per denial letter from [facility] patient needs to try the preferred products first. Either aimovig or emgality. Sent msg to Dr to see which one to do.

## 2020-04-08 NOTE — Telephone Encounter (Signed)
I sent a prescription for Aimovig 70mg  to her [facility] in [place]

## 2020-04-08 NOTE — Telephone Encounter (Signed)
Received a denial on the PA for Ajovy. Patient needs to try the aimovig or emgality for her insurance. Those are the preferred. Please let me know which one to do PA on.

## 2020-04-17 ENCOUNTER — Encounter (HOSPITAL_COMMUNITY): Payer: Self-pay

## 2020-04-17 ENCOUNTER — Ambulatory Visit
Admission: RE | Admit: 2020-04-17 | Discharge: 2020-04-17 | Disposition: A | Discharge status: Home / Self Care | Payer: Medicare Other | Source: Ambulatory Visit

## 2020-04-17 ENCOUNTER — Other Ambulatory Visit: Payer: Self-pay

## 2020-04-17 ENCOUNTER — Emergency Department (HOSPITAL_COMMUNITY)
Admission: EM | Admit: 2020-04-17 | Discharge: 2020-04-17 | Disposition: A | Discharge status: Home / Self Care | Payer: Medicare Other | Attending: Emergency Medicine | Admitting: Emergency Medicine

## 2020-04-17 ENCOUNTER — Emergency Department (HOSPITAL_COMMUNITY): Payer: Medicare Other

## 2020-04-17 DIAGNOSIS — I130 Hypertensive heart and chronic kidney disease with heart failure and stage 1 through stage 4 chronic kidney disease, or unspecified chronic kidney disease: Secondary | ICD-10-CM | POA: Diagnosis not present

## 2020-04-17 DIAGNOSIS — Z7982 Long term (current) use of aspirin: Secondary | ICD-10-CM | POA: Diagnosis not present

## 2020-04-17 DIAGNOSIS — K219 Gastro-esophageal reflux disease without esophagitis: Secondary | ICD-10-CM | POA: Diagnosis not present

## 2020-04-17 DIAGNOSIS — I5030 Unspecified diastolic (congestive) heart failure: Secondary | ICD-10-CM | POA: Diagnosis not present

## 2020-04-17 DIAGNOSIS — E860 Dehydration: Secondary | ICD-10-CM | POA: Diagnosis not present

## 2020-04-17 DIAGNOSIS — R197 Diarrhea, unspecified: Secondary | ICD-10-CM

## 2020-04-17 DIAGNOSIS — N189 Chronic kidney disease, unspecified: Secondary | ICD-10-CM | POA: Diagnosis not present

## 2020-04-17 DIAGNOSIS — Z20822 Contact with and (suspected) exposure to covid-19: Secondary | ICD-10-CM | POA: Diagnosis not present

## 2020-04-17 DIAGNOSIS — Z7984 Long term (current) use of oral hypoglycemic drugs: Secondary | ICD-10-CM | POA: Insufficient documentation

## 2020-04-17 DIAGNOSIS — R112 Nausea with vomiting, unspecified: Secondary | ICD-10-CM | POA: Diagnosis not present

## 2020-04-17 DIAGNOSIS — R42 Dizziness and giddiness: Secondary | ICD-10-CM | POA: Diagnosis present

## 2020-04-17 DIAGNOSIS — R109 Unspecified abdominal pain: Secondary | ICD-10-CM | POA: Insufficient documentation

## 2020-04-17 DIAGNOSIS — Z79899 Other long term (current) drug therapy: Secondary | ICD-10-CM | POA: Insufficient documentation

## 2020-04-17 LAB — URINALYSIS, ROUTINE W REFLEX MICROSCOPIC
Bacteria, UA: NONE SEEN
Bilirubin Urine: NEGATIVE
Glucose, UA: NEGATIVE mg/dL
Hgb urine dipstick: NEGATIVE
Ketones, ur: NEGATIVE mg/dL
Nitrite: NEGATIVE
Protein, ur: NEGATIVE mg/dL
Specific Gravity, Urine: 1.011 (ref 1.005–1.030)
pH: 5 (ref 5.0–8.0)

## 2020-04-17 LAB — COMPREHENSIVE METABOLIC PANEL
ALT: 27 U/L (ref 0–44)
AST: 23 U/L (ref 15–41)
Albumin: 4 g/dL (ref 3.5–5.0)
Alkaline Phosphatase: 73 U/L (ref 38–126)
Anion gap: 10 (ref 5–15)
BUN: 15 mg/dL (ref 8–23)
CO2: 26 mmol/L (ref 22–32)
Calcium: 9.3 mg/dL (ref 8.9–10.3)
Chloride: 100 mmol/L (ref 98–111)
Creatinine, Ser: 1.47 mg/dL — ABNORMAL HIGH (ref 0.44–1.00)
GFR, Estimated: 39 mL/min — ABNORMAL LOW (ref >60)
Glucose, Bld: 118 mg/dL — ABNORMAL HIGH (ref 70–99)
Potassium: 3.7 mmol/L (ref 3.5–5.1)
Sodium: 136 mmol/L (ref 135–145)
Total Bilirubin: 0.7 mg/dL (ref 0.3–1.2)
Total Protein: 7.2 g/dL (ref 6.5–8.1)

## 2020-04-17 LAB — CBC WITH DIFFERENTIAL/PLATELET
Abs Immature Granulocytes: 0.05 10*3/uL (ref 0.00–0.07)
Basophils Absolute: 0.1 10*3/uL (ref 0.0–0.1)
Basophils Relative: 1 %
Eosinophils Absolute: 0.3 10*3/uL (ref 0.0–0.5)
Eosinophils Relative: 3 %
HCT: 40.3 % (ref 36.0–46.0)
Hemoglobin: 12.9 g/dL (ref 12.0–15.0)
Immature Granulocytes: 1 %
Lymphocytes Relative: 24 %
Lymphs Abs: 2.3 10*3/uL (ref 0.7–4.0)
MCH: 26.4 pg (ref 26.0–34.0)
MCHC: 32 g/dL (ref 30.0–36.0)
MCV: 82.4 fL (ref 80.0–100.0)
Monocytes Absolute: 0.7 10*3/uL (ref 0.1–1.0)
Monocytes Relative: 7 %
Neutro Abs: 6.3 10*3/uL (ref 1.7–7.7)
Neutrophils Relative %: 64 %
Platelets: 231 10*3/uL (ref 150–400)
RBC: 4.89 MIL/uL (ref 3.87–5.11)
RDW: 17.4 % — ABNORMAL HIGH (ref 11.5–15.5)
WBC: 9.7 10*3/uL (ref 4.0–10.5)
nRBC: 0 % (ref 0.0–0.2)

## 2020-04-17 LAB — LIPASE, BLOOD: Lipase: 22 U/L (ref 11–51)

## 2020-04-17 LAB — RESP PANEL BY RT-PCR (FLU A&B, COVID) ARPGX2
Influenza A by PCR: NEGATIVE
Influenza B by PCR: NEGATIVE
SARS Coronavirus 2 by RT PCR: NEGATIVE

## 2020-04-17 MED ORDER — ALIGN 4 MG PO CAPS: 1.0000 | ORAL_CAPSULE | Freq: Every day | ORAL | 0 refills | Status: AC

## 2020-04-17 MED ORDER — IOHEXOL 300 MG/ML  SOLN
75.0000 mL | Freq: Once | INTRAMUSCULAR | Status: AC | PRN
  Administered 2020-04-17: 75 mL via INTRAVENOUS

## 2020-04-17 MED ORDER — MORPHINE SULFATE (PF) 4 MG/ML IV SOLN
4.0000 mg | Freq: Once | INTRAVENOUS | Status: AC
  Administered 2020-04-17: 4 mg via INTRAVENOUS
  Filled 2020-04-17: qty 1

## 2020-04-17 MED ORDER — ONDANSETRON HCL 4 MG/2ML IJ SOLN
4.0000 mg | Freq: Once | INTRAMUSCULAR | Status: AC
  Administered 2020-04-17: 4 mg via INTRAVENOUS
  Filled 2020-04-17: qty 2

## 2020-04-17 MED ORDER — ONDANSETRON 4 MG PO TBDP: 4.0000 mg | ORAL_TABLET | Freq: Three times a day (TID) | ORAL | 0 refills | Status: DC | PRN

## 2020-04-17 MED ORDER — SODIUM CHLORIDE 0.9 % IV BOLUS
1000.0000 mL | Freq: Once | INTRAVENOUS | Status: AC
  Administered 2020-04-17: 1000 mL via INTRAVENOUS

## 2020-04-17 NOTE — ED Triage Notes (Signed)
Pt presents with c/o diarrhea and vomiting for past 3 days, has become weak and shaky   Patient is being discharged from the Urgent Care and sent to the Emergency Department via private vehicle . Per B Wurst , patient is in need of higher level of care due to low BP rule out dehydration . Patient is aware and verbalizes understanding of plan of care.  Vitals:   04/17/20 0904  BP: (!) 88/54  Pulse: (!) 115  Resp: 20  Temp: 98.9 F (37.2 C)  SpO2: 95%

## 2020-04-17 NOTE — ED Notes (Signed)
Pt ambulatory to waiting room. Pt verbalized understanding of discharge instructions.   

## 2020-04-17 NOTE — ED Provider Notes (Signed)
[facility] EMERGENCY DEPARTMENT Provider Note   CSN: [id number] Arrival date & time: 04/17/20  [id number]     History Chief Complaint  Patient presents with  . Abdominal Pain    Jennifer Davidson is a 64 y.o. female.  Pt presents to the ED today with abdominal pain, n/v/d.  The pt said she started with nausea and now it's moved to vomiting.  The pt said she had severe diarrhea last night.  She initially went to UC and bp was 88/54 there.  BP has improved here.  Pt denies any f/c. She feels dizzy with standing.        Past Medical History:  Diagnosis Date  . Acute encephalopathy 06/25/2016  . Anxiety   . Cataract 08/31/2017   left eye  . Cholesterol serum increased   . Chronic back pain    chronic Rt low back pain. s/p L4-5 fusion. failed Rt facet injections. poss due to Rt SI joint dysfunction.  . Chronic kidney disease   . Depression   . Diastolic heart failure (HCC)   . GERD (gastroesophageal reflux disease)   . Hyperlipidemia   . Hyperparathyroidism (HCC)   . Hypertension   . Hypothyroidism   . Migraine   . Neuromuscular disorder (HCC)   . Seizures (HCC)    3 years ago,06/20/19  . Sleep apnea    cpap  . Syncope 08/30/2015  . Tachycardia   . Trochanteric bursitis of both hips 2012   Confirmed on MRI    Patient Active Problem List   Diagnosis Date Noted  . Rotator cuff tear arthropathy of left shoulder 10/20/2018  . Arthritis of left acromioclavicular joint 10/20/2018  . Osteoarthritis of left shoulder 10/20/2018  . URI (upper respiratory infection) 09/27/2018  . Community acquired pneumonia of left lower lobe of lung 09/25/2018  . Head injury 09/08/2018  . Fall as cause of accidental injury at home as place of occurrence 09/08/2018  . Hallucination, visual 09/14/2017  . Prediabetes 09/14/2017  . Insomnia due to other mental disorder 09/14/2017  . Dream anxiety disorder 09/14/2017  . Vivid dream 09/14/2017  . Altered mental status 06/23/2017  . Irritable bowel  syndrome with diarrhea 04/25/2017  . Sacroiliac joint dysfunction of right side 03/17/2017  . 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  . Seizure-like activity (HCC) 09/14/2016  . Moderate episode of recurrent major depressive disorder (HCC) 08/31/2016  . PTSD (post-traumatic stress disorder) 08/31/2016  . Recurrent UTI 06/25/2016  . Severe recurrent depression with psychosis (HCC) 06/25/2016  . GERD (gastroesophageal reflux disease) 11/13/2015  . Generalized anxiety disorder 06/14/2015  . Chronic pain of right lower extremity 05/14/2015  . Neuropathy of lower extremity 05/14/2015  . Headache, migraine 01/17/2015  . Severe recurrent major depressive disorder with psychotic features (HCC)   . Major depression, recurrent, chronic (HCC) 12/02/2014  . Mood disorder (HCC) 12/01/2014  . Chronic back pain   . Hyperparathyroidism, 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  . Neuromuscular disorder (HCC) 07/31/2014  . Lower back pain 09/21/2013  . Diastolic heart failure (HCC) 03/17/2012  . Syncope and collapse 02/12/2012  . Chronic kidney disease 02/04/2012  . Edema 02/04/2012  . Tachycardia 02/04/2012  . Encounter for long-term (current) use of medications 02/04/2012  . Anxiety and depression 02/04/2012  . Hypothyroid 02/04/2012  . Mixed hyperlipidemia 02/04/2012    Past Surgical History:  Procedure Laterality Date  . ABDOMINAL HYSTERECTOMY    . APPENDECTOMY  1986  . BACK SURGERY     L4-5  fusion  . CATARACT EXTRACTION    . KNEE SURGERY    . SHOULDER ARTHROSCOPY WITH SUBACROMIAL DECOMPRESSION Left 10/20/2018   Procedure: SHOULDER ARTHROSCOPY, ROTATOR CUFF DEBRIDEMENT, GLENOHUMERAL JOINT DEBRIDEMENT, ACROMIOPLASTY, DISTAL CLAVICLE RESECTION;  Surgeon: Jodi Geralds, MD;  Location: [facility];  Service: Orthopedics;  Laterality: Left;     OB History   No obstetric history on file.     Family History  Problem Relation Age of Onset  . Diabetes Mother   . Heart disease Mother    . Kidney disease Mother   . Thyroid disease Mother   . Heart disease Father   . Seizures Father   . COPD Father   . Irritable bowel syndrome Father   . Cancer Maternal Grandmother   . Diabetes Sister   . Diabetes Brother   . Colon cancer Neg Hx   . Stomach cancer Neg Hx   . Colon polyps Neg Hx   . Esophageal cancer Neg Hx   . Rectal cancer Neg Hx     Social History   Tobacco Use  . Smoking status: Never Smoker  . Smokeless tobacco: Never Used  Vaping Use  . Vaping Use: Never used  Substance Use Topics  . Alcohol use: No  . Drug use: No    Comment: 08-31-2016 PER PT NO     Home Medications Prior to Admission medications   Medication Sig Start Date End Date Taking? Authorizing Provider  albuterol (VENTOLIN HFA) 108 (90 Base) MCG/ACT inhaler TAKE 2 PUFFS BY MOUTH EVERY 6 HOURS AS NEEDED FOR WHEEZE OR SHORTNESS OF BREATH Patient taking differently: Inhale 2 puffs into the lungs every 6 (six) hours as needed for wheezing or shortness of breath.  10/28/19  Yes Myles Lipps, MD  aspirin EC 81 MG tablet Take 1 tablet (81 mg total) by mouth daily. 07/04/19  Yes Turner, Cornelious Bryant, MD  atorvastatin (LIPITOR) 20 MG tablet TAKE 1 TABLET BY MOUTH EVERY DAY Patient taking differently: Take 20 mg by mouth daily.  01/29/20  Yes Myles Lipps, MD  buPROPion (WELLBUTRIN XL) 150 MG 24 hr tablet Take 1 tablet (150 mg total) by mouth daily. 02/18/20  Yes Myles Lipps, MD  busPIRone (BUSPAR) 7.5 MG tablet Take 1 tablet (7.5 mg total) by mouth 3 (three) times daily. 02/18/20  Yes Myles Lipps, MD  DULoxetine (CYMBALTA) 60 MG capsule TAKE 1 CAPSULE EVERY DAY Patient taking differently: Take 60 mg by mouth daily.  02/06/20  Yes Myles Lipps, MD  Erenumab-aooe (AIMOVIG) 70 MG/ML SOAJ Inject 70 mg into the skin every 28 (twenty-eight) days. 04/08/20  Yes Jaffe, Adam R, DO  esomeprazole (NEXIUM) 40 MG capsule TAKE 1 CAPSULE EVERY DAY Patient taking differently: Take 40 mg by mouth daily.   09/12/19  Yes Myles Lipps, MD  famotidine (PEPCID) 40 MG tablet Take 40 mg by mouth daily. 02/12/20  Yes [provider]  fluticasone (FLONASE) 50 MCG/ACT nasal spray PLACE 2 SPRAYS AT BEDTIME INTO BOTH NOSTRILS. Patient taking differently: Place 2 sprays into both nostrils daily.  08/23/18  Yes Myles Lipps, MD  gabapentin (NEURONTIN) 400 MG capsule Take 1 capsule (400 mg total) by mouth at bedtime. Patient taking differently: Take 600 mg by mouth 2 (two) times daily.  02/20/20  Yes Myles Lipps, MD  hydrOXYzine (ATARAX/VISTARIL) 50 MG tablet TAKE 1 TABLET EVERY 6 HOURS AS NEEDED Patient taking differently: Take 50 mg by mouth every 6 (six) hours as needed for  anxiety.  06/29/19  Yes Myles Lipps, MD  icosapent Ethyl (VASCEPA) 1 g capsule Take 2 capsules (2 g total) by mouth 2 (two) times daily. 07/31/19  Yes Turner, Cornelious Bryant, MD  levothyroxine (SYNTHROID) 50 MCG tablet TAKE 1 TABLET BY MOUTH EVERY OTHER DAY. BEFORE BREAKFAST, ALTERNATED WITH TABLET. Patient taking differently: Take 50 mcg by mouth every other day. Before breakfast 11/04/19  Yes Myles Lipps, MD  levothyroxine (SYNTHROID) 75 MCG tablet Take 75 mcg by mouth every other day. Before breakfast   Yes [provider]  metFORMIN (GLUCOPHAGE) 500 MG tablet Take 1 tablet (500 mg total) by mouth daily with breakfast. 02/26/20  Yes Myles Lipps, MD  prazosin (MINIPRESS) 2 MG capsule TAKE 1 CAPSULE BY MOUTH AT BEDTIME AS NEEDED. 02/08/20  Yes Myles Lipps, MD  prazosin (MINIPRESS) 5 MG capsule TAKE 2 TO 3 CAPSULES AT BEDTIME Patient taking differently: Take 5-10 mg by mouth at bedtime as needed.  09/16/19  Yes Myles Lipps, MD  QUEtiapine (SEROQUEL) 300 MG tablet TAKE 1 TABLET BY MOUTH EVERYDAY AT BEDTIME Patient taking differently: Take 300 mg by mouth at bedtime.  10/08/19  Yes Myles Lipps, MD  rizatriptan (MAXALT-MLT) 10 MG disintegrating tablet Take 1 tablet (10 mg total) by mouth as needed  for migraine. May repeat in 2 hours if needed 02/18/20  Yes Myles Lipps, MD  diclofenac sodium (VOLTAREN) 1 % GEL Apply 2 g topically daily as needed (for knee pain). Patient not taking: Reported on 03/27/2020 08/23/18   Myles Lipps, MD  ondansetron (ZOFRAN ODT) 4 MG disintegrating tablet Take 1 tablet (4 mg total) by mouth every 8 (eight) hours as needed for nausea or vomiting. 04/17/20   Jacalyn Lefevre, MD  Probiotic Product (ALIGN) 4 MG CAPS Take 1 capsule (4 mg total) by mouth daily. 04/17/20   Jacalyn Lefevre, MD  QUEtiapine (SEROQUEL) 50 MG tablet TAKE 1 TABLET AT BEDTIME AS NEEDED Patient not taking: Reported on 03/27/2020 02/06/20   Myles Lipps, MD  QUEtiapine (SEROQUEL) 50 MG tablet TAKE 1 TABLET AT BEDTIME AS NEEDED 09/13/19   Myles Lipps, MD    Allergies    Tramadol and Trazodone and nefazodone  Review of Systems   Review of Systems  Gastrointestinal: Positive for abdominal pain, diarrhea, nausea and vomiting.  All other systems reviewed and are negative.   Physical Exam Updated Vital Signs BP 115/69   Pulse 76   Resp 17   Ht 5\' 3"  (1.6 m)   Wt 83.9 kg   SpO2 95%   BMI 32.77 kg/m   Physical Exam Vitals and nursing note reviewed.  Constitutional:      Appearance: She is well-developed.  HENT:     Head: Normocephalic and atraumatic.     Mouth/Throat:     Mouth: Mucous membranes are dry.  Eyes:     Extraocular Movements: Extraocular movements intact.     Pupils: Pupils are equal, round, and reactive to light.  Cardiovascular:     Rate and Rhythm: Normal rate and regular rhythm.  Pulmonary:     Effort: Pulmonary effort is normal.     Breath sounds: Normal breath sounds.  Abdominal:     General: Abdomen is flat. Bowel sounds are normal.     Palpations: Abdomen is soft.     Tenderness: There is abdominal tenderness in the left lower quadrant.  Skin:    General: Skin is warm.  Capillary Refill: Capillary refill takes less than 2 seconds.   Neurological:     General: No focal deficit present.     Mental Status: She is alert and oriented to person, place, and time.  Psychiatric:        Mood and Affect: Mood normal.        Behavior: Behavior normal.     ED Results / Procedures / Treatments   Labs (all labs ordered are listed, but only abnormal results are displayed) Labs Reviewed  CBC WITH DIFFERENTIAL/PLATELET - Abnormal; Notable for the following components:      Result Value   RDW 17.4 (*)    All other components within normal limits  COMPREHENSIVE METABOLIC PANEL - Abnormal; Notable for the following components:   Glucose, Bld 118 (*)    Creatinine, Ser 1.47 (*)    GFR, Estimated 39 (*)    All other components within normal limits  URINALYSIS, ROUTINE W REFLEX MICROSCOPIC - Abnormal; Notable for the following components:   Leukocytes,Ua SMALL (*)    All other components within normal limits  RESP PANEL BY RT-PCR (FLU A&B, COVID) ARPGX2  LIPASE, BLOOD    EKG None  Radiology CT ABDOMEN PELVIS W CONTRAST  Result Date: 04/17/2020 CLINICAL DATA:  Acute left-sided abdominal pain. EXAM: CT ABDOMEN AND PELVIS WITH CONTRAST TECHNIQUE: Multidetector CT imaging of the abdomen and pelvis was performed using the standard protocol following bolus administration of intravenous contrast. CONTRAST:  76mL OMNIPAQUE IOHEXOL 300 MG/ML  SOLN COMPARISON:  Sep 25, 2018. FINDINGS: Lower chest: No acute abnormality. Hepatobiliary: No focal liver abnormality is seen. No gallstones, gallbladder wall thickening, or biliary dilatation. Pancreas: Unremarkable. No pancreatic ductal dilatation or surrounding inflammatory changes. Spleen: Normal in size without focal abnormality. Adrenals/Urinary Tract: Adrenal glands are unremarkable. Kidneys are normal, without renal calculi, focal lesion, or hydronephrosis. Bladder is unremarkable. Stomach/Bowel: The stomach appears normal. There is no evidence of bowel obstruction or inflammation. Status post  appendectomy. Vascular/Lymphatic: No significant vascular findings are present. No enlarged abdominal or pelvic lymph nodes. Reproductive: Status post hysterectomy. No adnexal masses. Other: No abdominal wall hernia or abnormality. No abdominopelvic ascites. Musculoskeletal: No acute or significant osseous findings. IMPRESSION: No acute abnormality seen in the abdomen or pelvis. Electronically Signed   By: Lupita Raider M.D.   On: 04/17/2020 11:10    Procedures Procedures (including critical care time)  Medications Ordered in ED Medications  ondansetron (ZOFRAN) injection 4 mg (4 mg Intravenous Given 04/17/20 1011)  morphine 4 MG/ML injection 4 mg (4 mg Intravenous Given 04/17/20 1011)  sodium chloride 0.9 % bolus 1,000 mL (1,000 mLs Intravenous New Bag/Given 04/17/20 1012)  iohexol (OMNIPAQUE) 300 MG/ML solution 75 mL (75 mLs Intravenous Contrast Given 04/17/20 1050)    ED Course  I have reviewed the triage vital signs and the nursing notes.  Pertinent labs & imaging results that were available during my care of the patient were reviewed by me and considered in my medical decision making (see chart for details).    MDM Rules/Calculators/A&P                          Pt is feeling much better after fluids.  She is able to tolerate pos.  Work up unremarkable.  She has not had any diarrhea here, so no stool was sent for eval.  She is stable for d/c.  Return if worse.  F/u with pcp.   Final Clinical Impression(s) /  ED Diagnoses Final diagnoses:  Dehydration  Non-intractable vomiting with nausea, unspecified vomiting type  Diarrhea, unspecified type    Rx / DC Orders ED Discharge Orders         Ordered    ondansetron (ZOFRAN ODT) 4 MG disintegrating tablet  Every 8 hours PRN        04/17/20 1139    Probiotic Product (ALIGN) 4 MG CAPS  Daily        04/17/20 1239           Jacalyn Lefevre, MD 04/17/20 1243

## 2020-04-17 NOTE — ED Triage Notes (Signed)
Pt presents to ED, sent by Urgent Care, for generalized abd pain, nausea, vomiting and diarrhea x couple of days. Pt hypotensive at UC. BP here 132/68

## 2020-04-28 ENCOUNTER — Other Ambulatory Visit: Payer: Self-pay

## 2020-04-28 ENCOUNTER — Encounter (HOSPITAL_COMMUNITY): Payer: Self-pay

## 2020-04-28 DIAGNOSIS — Z79899 Other long term (current) drug therapy: Secondary | ICD-10-CM

## 2020-04-28 DIAGNOSIS — F32A Depression, unspecified: Secondary | ICD-10-CM | POA: Diagnosis present

## 2020-04-28 DIAGNOSIS — N2581 Secondary hyperparathyroidism of renal origin: Secondary | ICD-10-CM | POA: Diagnosis not present

## 2020-04-28 DIAGNOSIS — A419 Sepsis, unspecified organism: Secondary | ICD-10-CM | POA: Diagnosis not present

## 2020-04-28 DIAGNOSIS — K219 Gastro-esophageal reflux disease without esophagitis: Secondary | ICD-10-CM | POA: Diagnosis present

## 2020-04-28 DIAGNOSIS — G43909 Migraine, unspecified, not intractable, without status migrainosus: Secondary | ICD-10-CM | POA: Diagnosis present

## 2020-04-28 DIAGNOSIS — Z841 Family history of disorders of kidney and ureter: Secondary | ICD-10-CM

## 2020-04-28 DIAGNOSIS — Z833 Family history of diabetes mellitus: Secondary | ICD-10-CM

## 2020-04-28 DIAGNOSIS — R000 Tachycardia, unspecified: Secondary | ICD-10-CM | POA: Diagnosis not present

## 2020-04-28 DIAGNOSIS — R6510 Systemic inflammatory response syndrome (SIRS) of non-infectious origin without acute organ dysfunction: Secondary | ICD-10-CM | POA: Diagnosis not present

## 2020-04-28 DIAGNOSIS — Z7984 Long term (current) use of oral hypoglycemic drugs: Secondary | ICD-10-CM

## 2020-04-28 DIAGNOSIS — A4151 Sepsis due to Escherichia coli [E. coli]: Principal | ICD-10-CM | POA: Diagnosis present

## 2020-04-28 DIAGNOSIS — Z8249 Family history of ischemic heart disease and other diseases of the circulatory system: Secondary | ICD-10-CM

## 2020-04-28 DIAGNOSIS — F411 Generalized anxiety disorder: Secondary | ICD-10-CM | POA: Diagnosis present

## 2020-04-28 DIAGNOSIS — Z20822 Contact with and (suspected) exposure to covid-19: Secondary | ICD-10-CM | POA: Diagnosis not present

## 2020-04-28 DIAGNOSIS — Z825 Family history of asthma and other chronic lower respiratory diseases: Secondary | ICD-10-CM

## 2020-04-28 DIAGNOSIS — E876 Hypokalemia: Secondary | ICD-10-CM | POA: Diagnosis present

## 2020-04-28 DIAGNOSIS — N3000 Acute cystitis without hematuria: Secondary | ICD-10-CM | POA: Diagnosis not present

## 2020-04-28 DIAGNOSIS — Z8349 Family history of other endocrine, nutritional and metabolic diseases: Secondary | ICD-10-CM

## 2020-04-28 DIAGNOSIS — Z885 Allergy status to narcotic agent status: Secondary | ICD-10-CM

## 2020-04-28 DIAGNOSIS — I5032 Chronic diastolic (congestive) heart failure: Secondary | ICD-10-CM | POA: Diagnosis present

## 2020-04-28 DIAGNOSIS — R072 Precordial pain: Secondary | ICD-10-CM | POA: Diagnosis present

## 2020-04-28 DIAGNOSIS — Z981 Arthrodesis status: Secondary | ICD-10-CM

## 2020-04-28 DIAGNOSIS — N1832 Chronic kidney disease, stage 3b: Secondary | ICD-10-CM | POA: Diagnosis present

## 2020-04-28 DIAGNOSIS — Z79890 Hormone replacement therapy (postmenopausal): Secondary | ICD-10-CM

## 2020-04-28 DIAGNOSIS — E1122 Type 2 diabetes mellitus with diabetic chronic kidney disease: Secondary | ICD-10-CM | POA: Diagnosis present

## 2020-04-28 DIAGNOSIS — E039 Hypothyroidism, unspecified: Secondary | ICD-10-CM | POA: Diagnosis present

## 2020-04-28 DIAGNOSIS — F4310 Post-traumatic stress disorder, unspecified: Secondary | ICD-10-CM | POA: Diagnosis present

## 2020-04-28 DIAGNOSIS — R509 Fever, unspecified: Secondary | ICD-10-CM | POA: Diagnosis not present

## 2020-04-28 DIAGNOSIS — I130 Hypertensive heart and chronic kidney disease with heart failure and stage 1 through stage 4 chronic kidney disease, or unspecified chronic kidney disease: Secondary | ICD-10-CM | POA: Diagnosis present

## 2020-04-28 DIAGNOSIS — E871 Hypo-osmolality and hyponatremia: Secondary | ICD-10-CM | POA: Diagnosis not present

## 2020-04-28 DIAGNOSIS — E872 Acidosis: Secondary | ICD-10-CM | POA: Diagnosis not present

## 2020-04-28 DIAGNOSIS — E213 Hyperparathyroidism, unspecified: Secondary | ICD-10-CM | POA: Diagnosis present

## 2020-04-28 DIAGNOSIS — Z7982 Long term (current) use of aspirin: Secondary | ICD-10-CM

## 2020-04-28 DIAGNOSIS — E782 Mixed hyperlipidemia: Secondary | ICD-10-CM | POA: Diagnosis present

## 2020-04-28 LAB — COMPREHENSIVE METABOLIC PANEL
ALT: 27 U/L (ref 0–44)
AST: 32 U/L (ref 15–41)
Albumin: 3.6 g/dL (ref 3.5–5.0)
Alkaline Phosphatase: 117 U/L (ref 38–126)
Anion gap: 13 (ref 5–15)
BUN: 14 mg/dL (ref 8–23)
CO2: 22 mmol/L (ref 22–32)
Calcium: 8.7 mg/dL — ABNORMAL LOW (ref 8.9–10.3)
Chloride: 95 mmol/L — ABNORMAL LOW (ref 98–111)
Creatinine, Ser: 1.57 mg/dL — ABNORMAL HIGH (ref 0.44–1.00)
GFR, Estimated: 36 mL/min — ABNORMAL LOW (ref >60)
Glucose, Bld: 136 mg/dL — ABNORMAL HIGH (ref 70–99)
Potassium: 3.8 mmol/L (ref 3.5–5.1)
Sodium: 130 mmol/L — ABNORMAL LOW (ref 135–145)
Total Bilirubin: 0.6 mg/dL (ref 0.3–1.2)
Total Protein: 7.5 g/dL (ref 6.5–8.1)

## 2020-04-28 LAB — CBC
HCT: 38.5 % (ref 36.0–46.0)
Hemoglobin: 12.5 g/dL (ref 12.0–15.0)
MCH: 25.9 pg — ABNORMAL LOW (ref 26.0–34.0)
MCHC: 32.5 g/dL (ref 30.0–36.0)
MCV: 79.9 fL — ABNORMAL LOW (ref 80.0–100.0)
Platelets: 229 10*3/uL (ref 150–400)
RBC: 4.82 MIL/uL (ref 3.87–5.11)
RDW: 16 % — ABNORMAL HIGH (ref 11.5–15.5)
WBC: 15.3 10*3/uL — ABNORMAL HIGH (ref 4.0–10.5)
nRBC: 0 % (ref 0.0–0.2)

## 2020-04-28 LAB — URINALYSIS, ROUTINE W REFLEX MICROSCOPIC
Bilirubin Urine: NEGATIVE
Glucose, UA: NEGATIVE mg/dL
Ketones, ur: NEGATIVE mg/dL
Nitrite: NEGATIVE
Protein, ur: 30 mg/dL — AB
Specific Gravity, Urine: 1.01 (ref 1.005–1.030)
WBC, UA: >50 WBC/hpf — ABNORMAL HIGH (ref 0–5)
pH: 6 (ref 5.0–8.0)

## 2020-04-28 LAB — LIPASE, BLOOD: Lipase: 19 U/L (ref 11–51)

## 2020-04-28 MED ORDER — ACETAMINOPHEN 325 MG PO TABS
650.0000 mg | ORAL_TABLET | Freq: Once | ORAL | Status: AC | PRN
  Administered 2020-04-28: 650 mg via ORAL
  Filled 2020-04-28: qty 2

## 2020-04-28 NOTE — ED Triage Notes (Signed)
Pt reports RLQ abdominal pain, cough, body aches and fever for approx 1 month.

## 2020-04-28 NOTE — ED Notes (Signed)
URINE CULTURE ALSO COLLECTED

## 2020-04-29 ENCOUNTER — Emergency Department (HOSPITAL_COMMUNITY): Payer: Medicare Other

## 2020-04-29 ENCOUNTER — Ambulatory Visit: Payer: Medicare Other | Admitting: Family Medicine

## 2020-04-29 ENCOUNTER — Telehealth: Payer: Self-pay | Admitting: General Practice

## 2020-04-29 ENCOUNTER — Inpatient Hospital Stay (HOSPITAL_COMMUNITY)
Admission: EM | Admit: 2020-04-29 | Discharge: 2020-05-01 | DRG: 872 | Disposition: A | Discharge status: Home / Self Care | Payer: Medicare Other | Attending: Internal Medicine | Admitting: Internal Medicine

## 2020-04-29 DIAGNOSIS — Z7982 Long term (current) use of aspirin: Secondary | ICD-10-CM | POA: Diagnosis not present

## 2020-04-29 DIAGNOSIS — F4310 Post-traumatic stress disorder, unspecified: Secondary | ICD-10-CM | POA: Diagnosis present

## 2020-04-29 DIAGNOSIS — R6510 Systemic inflammatory response syndrome (SIRS) of non-infectious origin without acute organ dysfunction: Secondary | ICD-10-CM | POA: Diagnosis not present

## 2020-04-29 DIAGNOSIS — E213 Hyperparathyroidism, unspecified: Secondary | ICD-10-CM | POA: Diagnosis present

## 2020-04-29 DIAGNOSIS — R509 Fever, unspecified: Secondary | ICD-10-CM | POA: Diagnosis not present

## 2020-04-29 DIAGNOSIS — I5032 Chronic diastolic (congestive) heart failure: Secondary | ICD-10-CM | POA: Diagnosis not present

## 2020-04-29 DIAGNOSIS — E1122 Type 2 diabetes mellitus with diabetic chronic kidney disease: Secondary | ICD-10-CM | POA: Diagnosis not present

## 2020-04-29 DIAGNOSIS — K219 Gastro-esophageal reflux disease without esophagitis: Secondary | ICD-10-CM | POA: Diagnosis present

## 2020-04-29 DIAGNOSIS — A419 Sepsis, unspecified organism: Secondary | ICD-10-CM

## 2020-04-29 DIAGNOSIS — N3000 Acute cystitis without hematuria: Secondary | ICD-10-CM

## 2020-04-29 DIAGNOSIS — R072 Precordial pain: Secondary | ICD-10-CM | POA: Diagnosis present

## 2020-04-29 DIAGNOSIS — E872 Acidosis: Secondary | ICD-10-CM | POA: Diagnosis not present

## 2020-04-29 DIAGNOSIS — Z20822 Contact with and (suspected) exposure to covid-19: Secondary | ICD-10-CM | POA: Diagnosis not present

## 2020-04-29 DIAGNOSIS — I130 Hypertensive heart and chronic kidney disease with heart failure and stage 1 through stage 4 chronic kidney disease, or unspecified chronic kidney disease: Secondary | ICD-10-CM | POA: Diagnosis not present

## 2020-04-29 DIAGNOSIS — E782 Mixed hyperlipidemia: Secondary | ICD-10-CM | POA: Diagnosis not present

## 2020-04-29 DIAGNOSIS — E871 Hypo-osmolality and hyponatremia: Secondary | ICD-10-CM | POA: Diagnosis not present

## 2020-04-29 DIAGNOSIS — N2581 Secondary hyperparathyroidism of renal origin: Secondary | ICD-10-CM | POA: Diagnosis not present

## 2020-04-29 DIAGNOSIS — Z7984 Long term (current) use of oral hypoglycemic drugs: Secondary | ICD-10-CM | POA: Diagnosis not present

## 2020-04-29 DIAGNOSIS — N1832 Chronic kidney disease, stage 3b: Secondary | ICD-10-CM | POA: Diagnosis not present

## 2020-04-29 DIAGNOSIS — E039 Hypothyroidism, unspecified: Secondary | ICD-10-CM | POA: Diagnosis not present

## 2020-04-29 DIAGNOSIS — G43909 Migraine, unspecified, not intractable, without status migrainosus: Secondary | ICD-10-CM | POA: Diagnosis present

## 2020-04-29 DIAGNOSIS — E876 Hypokalemia: Secondary | ICD-10-CM | POA: Diagnosis present

## 2020-04-29 DIAGNOSIS — F411 Generalized anxiety disorder: Secondary | ICD-10-CM | POA: Diagnosis present

## 2020-04-29 DIAGNOSIS — Z79890 Hormone replacement therapy (postmenopausal): Secondary | ICD-10-CM | POA: Diagnosis not present

## 2020-04-29 DIAGNOSIS — F32A Depression, unspecified: Secondary | ICD-10-CM | POA: Diagnosis present

## 2020-04-29 DIAGNOSIS — A4151 Sepsis due to Escherichia coli [E. coli]: Secondary | ICD-10-CM | POA: Diagnosis not present

## 2020-04-29 DIAGNOSIS — N3090 Cystitis, unspecified without hematuria: Secondary | ICD-10-CM

## 2020-04-29 HISTORY — DX: Sepsis, unspecified organism: A41.9

## 2020-04-29 LAB — LACTIC ACID, PLASMA
Lactic Acid, Venous: 2.9 mmol/L (ref 0.5–1.9)
Lactic Acid, Venous: 2 mmol/L (ref 0.5–1.9)
Lactic Acid, Venous: 0.8 mmol/L (ref 0.5–1.9)
Lactic Acid, Venous: 1.6 mmol/L (ref 0.5–1.9)

## 2020-04-29 LAB — BLOOD CULTURE ID PANEL (REFLEXED) - BCID2

## 2020-04-29 LAB — HIV ANTIBODY (ROUTINE TESTING W REFLEX): HIV Screen 4th Generation wRfx: NONREACTIVE

## 2020-04-29 LAB — RESP PANEL BY RT-PCR (FLU A&B, COVID) ARPGX2
Influenza A by PCR: NEGATIVE
Influenza B by PCR: NEGATIVE
SARS Coronavirus 2 by RT PCR: NEGATIVE

## 2020-04-29 LAB — CBC
HCT: 32.5 % — ABNORMAL LOW (ref 36.0–46.0)
Hemoglobin: 10.8 g/dL — ABNORMAL LOW (ref 12.0–15.0)
MCH: 26.3 pg (ref 26.0–34.0)
MCHC: 33.2 g/dL (ref 30.0–36.0)
MCV: 79.1 fL — ABNORMAL LOW (ref 80.0–100.0)
Platelets: 183 10*3/uL (ref 150–400)
RBC: 4.11 MIL/uL (ref 3.87–5.11)
RDW: 15.9 % — ABNORMAL HIGH (ref 11.5–15.5)
WBC: 15.7 10*3/uL — ABNORMAL HIGH (ref 4.0–10.5)
nRBC: 0 % (ref 0.0–0.2)

## 2020-04-29 LAB — CBG MONITORING, ED: Glucose-Capillary: 142 mg/dL — ABNORMAL HIGH (ref 70–99)

## 2020-04-29 LAB — HEMOGLOBIN A1C
Hgb A1c MFr Bld: 5.7 % — ABNORMAL HIGH (ref 4.8–5.6)
Mean Plasma Glucose: 116.89 mg/dL

## 2020-04-29 LAB — GLUCOSE, CAPILLARY
Glucose-Capillary: 109 mg/dL — ABNORMAL HIGH (ref 70–99)
Glucose-Capillary: 103 mg/dL — ABNORMAL HIGH (ref 70–99)
Glucose-Capillary: 119 mg/dL — ABNORMAL HIGH (ref 70–99)

## 2020-04-29 LAB — CREATININE, SERUM
Creatinine, Ser: 1.36 mg/dL — ABNORMAL HIGH (ref 0.44–1.00)
GFR, Estimated: 43 mL/min — ABNORMAL LOW (ref >60)

## 2020-04-29 MED ORDER — LEVOTHYROXINE SODIUM 50 MCG PO TABS
50.0000 ug | ORAL_TABLET | ORAL | Status: DC
  Administered 2020-04-29 – 2020-05-01 (×2): 50 ug via ORAL
  Filled 2020-04-29: qty 1

## 2020-04-29 MED ORDER — LEVOTHYROXINE SODIUM 50 MCG PO TABS
75.0000 ug | ORAL_TABLET | ORAL | Status: DC
  Administered 2020-04-30: 06:00:00 75 ug via ORAL
  Filled 2020-04-29 (×2): qty 1

## 2020-04-29 MED ORDER — ASPIRIN EC 81 MG PO TBEC
81.0000 mg | DELAYED_RELEASE_TABLET | Freq: Every day | ORAL | Status: DC
  Administered 2020-04-29 – 2020-05-01 (×3): 81 mg via ORAL
  Filled 2020-04-29 (×3): qty 1

## 2020-04-29 MED ORDER — ACETAMINOPHEN 650 MG RE SUPP: 650.0000 mg | Freq: Four times a day (QID) | RECTAL | Status: DC | PRN

## 2020-04-29 MED ORDER — ONDANSETRON HCL 4 MG PO TABS: 4.0000 mg | ORAL_TABLET | Freq: Four times a day (QID) | ORAL | Status: DC | PRN

## 2020-04-29 MED ORDER — SENNOSIDES-DOCUSATE SODIUM 8.6-50 MG PO TABS: 1.0000 | ORAL_TABLET | Freq: Every evening | ORAL | Status: DC | PRN

## 2020-04-29 MED ORDER — LACTATED RINGERS IV BOLUS (SEPSIS)
1000.0000 mL | Freq: Once | INTRAVENOUS | Status: AC
  Administered 2020-04-29: 02:00:00 1000 mL via INTRAVENOUS

## 2020-04-29 MED ORDER — SODIUM CHLORIDE 0.9 % IV SOLN
2.0000 g | INTRAVENOUS | Status: DC
  Administered 2020-04-29 – 2020-05-01 (×2): 2 g via INTRAVENOUS
  Filled 2020-04-29 (×2): qty 20
  Filled 2020-04-29: qty 2

## 2020-04-29 MED ORDER — ALBUTEROL SULFATE HFA 108 (90 BASE) MCG/ACT IN AERS: 2.0000 | INHALATION_SPRAY | Freq: Four times a day (QID) | RESPIRATORY_TRACT | Status: DC | PRN

## 2020-04-29 MED ORDER — ACETAMINOPHEN 325 MG PO TABS
650.0000 mg | ORAL_TABLET | Freq: Four times a day (QID) | ORAL | Status: DC | PRN
  Administered 2020-04-29 – 2020-04-30 (×3): 650 mg via ORAL
  Filled 2020-04-29 (×3): qty 2

## 2020-04-29 MED ORDER — ONDANSETRON HCL 4 MG/2ML IJ SOLN: 4.0000 mg | Freq: Four times a day (QID) | INTRAMUSCULAR | Status: DC | PRN

## 2020-04-29 MED ORDER — DULOXETINE HCL 60 MG PO CPEP
60.0000 mg | ORAL_CAPSULE | Freq: Every day | ORAL | Status: DC
  Administered 2020-04-29 – 2020-05-01 (×3): 60 mg via ORAL
  Filled 2020-04-29 (×3): qty 1

## 2020-04-29 MED ORDER — INSULIN ASPART 100 UNIT/ML ~~LOC~~ SOLN: 0.0000 [IU] | Freq: Every day | SUBCUTANEOUS | Status: DC

## 2020-04-29 MED ORDER — ATORVASTATIN CALCIUM 20 MG PO TABS
20.0000 mg | ORAL_TABLET | Freq: Every day | ORAL | Status: DC
  Administered 2020-04-29 – 2020-05-01 (×3): 20 mg via ORAL
  Filled 2020-04-29 (×3): qty 1

## 2020-04-29 MED ORDER — INSULIN ASPART 100 UNIT/ML ~~LOC~~ SOLN: 0.0000 [IU] | Freq: Three times a day (TID) | SUBCUTANEOUS | Status: DC

## 2020-04-29 MED ORDER — SODIUM CHLORIDE 0.9 % IV SOLN: Freq: Once | INTRAVENOUS | Status: AC

## 2020-04-29 MED ORDER — BUSPIRONE HCL 5 MG PO TABS
7.5000 mg | ORAL_TABLET | Freq: Three times a day (TID) | ORAL | Status: DC
  Administered 2020-04-29 – 2020-05-01 (×6): 7.5 mg via ORAL
  Filled 2020-04-29 (×6): qty 2

## 2020-04-29 MED ORDER — LACTATED RINGERS IV BOLUS (SEPSIS)
500.0000 mL | Freq: Once | INTRAVENOUS | Status: AC
  Administered 2020-04-29: 02:00:00 500 mL via INTRAVENOUS

## 2020-04-29 MED ORDER — HYDROXYZINE HCL 50 MG PO TABS: 50.0000 mg | ORAL_TABLET | Freq: Four times a day (QID) | ORAL | Status: DC | PRN

## 2020-04-29 MED ORDER — BUPROPION HCL ER (XL) 150 MG PO TB24
150.0000 mg | ORAL_TABLET | Freq: Every day | ORAL | Status: DC
  Administered 2020-04-29 – 2020-05-01 (×3): 150 mg via ORAL
  Filled 2020-04-29 (×3): qty 1

## 2020-04-29 MED ORDER — QUETIAPINE FUMARATE 200 MG PO TABS
300.0000 mg | ORAL_TABLET | Freq: Every day | ORAL | Status: DC
  Administered 2020-04-29 – 2020-04-30 (×2): 300 mg via ORAL
  Filled 2020-04-29 (×2): qty 1

## 2020-04-29 MED ORDER — LACTATED RINGERS IV SOLN: INTRAVENOUS | Status: DC

## 2020-04-29 MED ORDER — ACETAMINOPHEN 325 MG PO TABS
650.0000 mg | ORAL_TABLET | Freq: Four times a day (QID) | ORAL | Status: DC | PRN
  Administered 2020-04-29: 650 mg via ORAL
  Filled 2020-04-29: qty 2

## 2020-04-29 MED ORDER — ENOXAPARIN SODIUM 40 MG/0.4ML ~~LOC~~ SOLN
40.0000 mg | SUBCUTANEOUS | Status: DC
  Administered 2020-04-29 – 2020-04-30 (×2): 40 mg via SUBCUTANEOUS
  Filled 2020-04-29 (×2): qty 0.4

## 2020-04-29 MED ORDER — GABAPENTIN 300 MG PO CAPS
600.0000 mg | ORAL_CAPSULE | Freq: Two times a day (BID) | ORAL | Status: DC
  Administered 2020-04-29 – 2020-05-01 (×5): 600 mg via ORAL
  Filled 2020-04-29 (×5): qty 2

## 2020-04-29 MED ORDER — SODIUM CHLORIDE 0.9 % IV SOLN
1.0000 g | INTRAVENOUS | Status: DC
  Administered 2020-04-29: 02:00:00 1 g via INTRAVENOUS
  Filled 2020-04-29 (×2): qty 10

## 2020-04-29 NOTE — ED Notes (Signed)
Called report to [place], [employer]ity fundraiser

## 2020-04-29 NOTE — Progress Notes (Signed)
PHARMACY - PHYSICIAN COMMUNICATION CRITICAL VALUE ALERT - BLOOD CULTURE IDENTIFICATION (BCID)  Jennifer Davidson is an 64 y.o. female who presented to [facility] on 04/29/2020 with a chief complaint of abdominal pain, nausea/vomiting.   Assessment: sepsis secondary to UTI. Now 1/4 blood culture bottles with E.coli.   Name of physician (or Provider) Contacted: Marikay Alar, NP  Current antibiotics: Ceftriaxone 1g IV q24h   Changes to prescribed antibiotics recommended:  Increase Ceftriaxone to 2g IV q24h   Results for orders placed or performed during the hospital encounter of 04/29/20  Blood Culture ID Panel (Reflexed) (Collected: 04/29/2020  1:42 AM)  Result Value Ref Range   Enterococcus faecalis NOT DETECTED NOT DETECTED   Enterococcus Faecium NOT DETECTED NOT DETECTED   Listeria monocytogenes NOT DETECTED NOT DETECTED   Staphylococcus species NOT DETECTED NOT DETECTED   Staphylococcus aureus (BCID) NOT DETECTED NOT DETECTED   Staphylococcus epidermidis NOT DETECTED NOT DETECTED   Staphylococcus lugdunensis NOT DETECTED NOT DETECTED   Streptococcus species NOT DETECTED NOT DETECTED   Streptococcus agalactiae NOT DETECTED NOT DETECTED   Streptococcus pneumoniae NOT DETECTED NOT DETECTED   Streptococcus pyogenes NOT DETECTED NOT DETECTED   A.calcoaceticus-baumannii NOT DETECTED NOT DETECTED   Bacteroides fragilis NOT DETECTED NOT DETECTED   Enterobacterales DETECTED (A) NOT DETECTED   Enterobacter cloacae complex NOT DETECTED NOT DETECTED   Escherichia coli DETECTED (A) NOT DETECTED   Klebsiella aerogenes NOT DETECTED NOT DETECTED   Klebsiella oxytoca NOT DETECTED NOT DETECTED   Klebsiella pneumoniae NOT DETECTED NOT DETECTED   Proteus species NOT DETECTED NOT DETECTED   Salmonella species NOT DETECTED NOT DETECTED   Serratia marcescens NOT DETECTED NOT DETECTED   Haemophilus influenzae NOT DETECTED NOT DETECTED   Neisseria meningitidis NOT DETECTED NOT DETECTED   Pseudomonas  aeruginosa NOT DETECTED NOT DETECTED   Stenotrophomonas maltophilia NOT DETECTED NOT DETECTED   Candida albicans NOT DETECTED NOT DETECTED   Candida auris NOT DETECTED NOT DETECTED   Candida glabrata NOT DETECTED NOT DETECTED   Candida krusei NOT DETECTED NOT DETECTED   Candida parapsilosis NOT DETECTED NOT DETECTED   Candida tropicalis NOT DETECTED NOT DETECTED   Cryptococcus neoformans/gattii NOT DETECTED NOT DETECTED   CTX-M ESBL NOT DETECTED NOT DETECTED   Carbapenem resistance IMP NOT DETECTED NOT DETECTED   Carbapenem resistance KPC NOT DETECTED NOT DETECTED   Carbapenem resistance NDM NOT DETECTED NOT DETECTED   Carbapenem resist OXA 48 LIKE NOT DETECTED NOT DETECTED   Carbapenem resistance VIM NOT DETECTED NOT DETECTED    Jamse Mead 04/29/2020  8:19 PM

## 2020-04-29 NOTE — ED Notes (Addendum)
Went into room and patient reported SOB and feeling as though she was unable to catch her breath. O2 sats appeared to be dropping so patient was placed on 2 L O2. Patient's   SPO2 sats now at 100 %.

## 2020-04-29 NOTE — ED Notes (Signed)
Date and time results received: 04/29/20 0016 (use smartphrase ".now" to insert current time)  Test: lactic acid  Critical Value: 2.9  Name of Provider Notified: Judd Lien, MD   Orders Received? Or Actions Taken?: Orders Received - See Orders for details

## 2020-04-29 NOTE — Telephone Encounter (Signed)
Patient's husband called to cancel patient's appointment for 9:30am today. Patient admitted to the hospital early this morning. Reminded patient's husband to call us to schedule a hospital follow up as soon as patient is discharged - reminded him it must be within 14 days of discharge date.

## 2020-04-29 NOTE — Progress Notes (Signed)
Pt. s/u with CPAP for h/s with m/ffmask per home use, currently on room air, made aware to notify if needed.

## 2020-04-29 NOTE — H&P (Signed)
History and Physical    Jennifer SantaSusie G Laser ZOX:[id number] DOB: 05/09/1956 DOA: 04/29/2020  PCP: Patient, No Pcp Per  Patient coming from: Home  Chief Complaint: ab pain, N/V  HPI: Jennifer Davidson is a 64 y.o. female with medical history significant of CKD3b, Anxiety, HLD, Hypothyroidism. Presenting with abdominal pain that started 1 month ago. Initially it was in the LLQ, but has migrated over to the RLQ over the last several weeks. It's sharp in episodes lasting 5 - 10 minutes. She has tried heating pads to help, but they have not. She has had N/V over the last 2 weeks. Anti-emetics provide temporary relief. She was seen in the ED about 12 days ago. Imaging for negative. She was given fluids and appeared to improve. She notes that over this past weekend, she had intermittent fevers and chills. She felt very poor. She tried to get into her PCP, but they are no longer available. So, she came to the ED. She denies any other aggravating or alleviating factors.     ED Course: Lab work showed elevated WBCs, temperature and lactic acid. She was found to have a UTI. Started on rocephin. [facility] was called for admission.   Review of Systems:  Denies CP, dyspnea, palpitations, HA. Review of systems is otherwise negative for all not mentioned in HPI.   PMHx Past Medical History:  Diagnosis Date  . Acute encephalopathy 06/25/2016  . Anxiety   . Cataract 08/31/2017   left eye  . Cholesterol serum increased   . Chronic back pain    chronic Rt low back pain. s/p L4-5 fusion. failed Rt facet injections. poss due to Rt SI joint dysfunction.  . Chronic kidney disease   . Depression   . Diastolic heart failure (HCC)   . GERD (gastroesophageal reflux disease)   . Hyperlipidemia   . Hyperparathyroidism (HCC)   . Hypertension   . Hypothyroidism   . Migraine   . Neuromuscular disorder (HCC)   . Seizures (HCC)    3 years ago,06/20/19  . Sleep apnea    cpap  . Syncope 08/30/2015  . Tachycardia   . Trochanteric  bursitis of both hips 2012   Confirmed on MRI    PSHx Past Surgical History:  Procedure Laterality Date  . ABDOMINAL HYSTERECTOMY    . APPENDECTOMY  1986  . BACK SURGERY     L4-5 fusion  . CATARACT EXTRACTION    . KNEE SURGERY    . SHOULDER ARTHROSCOPY WITH SUBACROMIAL DECOMPRESSION Left 10/20/2018   Procedure: SHOULDER ARTHROSCOPY, ROTATOR CUFF DEBRIDEMENT, GLENOHUMERAL JOINT DEBRIDEMENT, ACROMIOPLASTY, DISTAL CLAVICLE RESECTION;  Surgeon: Jodi GeraldsGraves, John, MD;  Location: [facility];  Service: Orthopedics;  Laterality: Left;    SocHx  reports that she has never smoked. She has never used smokeless tobacco. She reports that she does not drink alcohol and does not use drugs.  Allergies  Allergen Reactions  . Tramadol Nausea And Vomiting  . Trazodone And Nefazodone Other (See Comments)    Per pt trazodone caused hallucinations and behavior changes     FamHx Family History  Problem Relation Age of Onset  . Diabetes Mother   . Heart disease Mother   . Kidney disease Mother   . Thyroid disease Mother   . Heart disease Father   . Seizures Father   . COPD Father   . Irritable bowel syndrome Father   . Cancer Maternal Grandmother   . Diabetes Sister   . Diabetes Brother   . Colon cancer  Neg Hx   . Stomach cancer Neg Hx   . Colon polyps Neg Hx   . Esophageal cancer Neg Hx   . Rectal cancer Neg Hx     Prior to Admission medications   Medication Sig Start Date End Date Taking? Authorizing Provider  albuterol (VENTOLIN HFA) 108 (90 Base) MCG/ACT inhaler TAKE 2 PUFFS BY MOUTH EVERY 6 HOURS AS NEEDED FOR WHEEZE OR SHORTNESS OF BREATH Patient taking differently: Inhale 2 puffs into the lungs every 6 (six) hours as needed for wheezing or shortness of breath. 10/28/19  Yes Lezlie Lye, Meda Coffee, MD  aspirin EC 81 MG tablet Take 1 tablet (81 mg total) by mouth daily. 07/04/19  Yes Turner, Cornelious Bryant, MD  atorvastatin (LIPITOR) 20 MG tablet TAKE 1 TABLET BY MOUTH EVERY DAY Patient taking  differently: Take 20 mg by mouth daily. 01/29/20  Yes Lezlie Lye, Meda Coffee, MD  buPROPion (WELLBUTRIN XL) 150 MG 24 hr tablet Take 1 tablet (150 mg total) by mouth daily. 02/18/20  Yes Lezlie Lye, Meda Coffee, MD  busPIRone (BUSPAR) 7.5 MG tablet Take 1 tablet (7.5 mg total) by mouth 3 (three) times daily. 02/18/20  Yes Lezlie Lye, Meda Coffee, MD  DULoxetine (CYMBALTA) 60 MG capsule TAKE 1 CAPSULE EVERY DAY Patient taking differently: Take 60 mg by mouth daily. 02/06/20  Yes Lezlie Lye, Irma M, MD  Erenumab-aooe (AIMOVIG) 70 MG/ML SOAJ Inject 70 mg into the skin every 28 (twenty-eight) days. 04/08/20  Yes Jaffe, Adam R, DO  esomeprazole (NEXIUM) 40 MG capsule TAKE 1 CAPSULE EVERY DAY Patient taking differently: Take 40 mg by mouth daily. 09/12/19  Yes Lezlie Lye, Meda Coffee, MD  famotidine (PEPCID) 40 MG tablet Take 40 mg by mouth daily. 02/12/20  Yes [provider]  fluticasone (FLONASE) 50 MCG/ACT nasal spray PLACE 2 SPRAYS AT BEDTIME INTO BOTH NOSTRILS. Patient taking differently: Place 2 sprays into both nostrils daily. 08/23/18  Yes Lezlie Lye, Meda Coffee, MD  gabapentin (NEURONTIN) 400 MG capsule Take 1 capsule (400 mg total) by mouth at bedtime. Patient taking differently: Take 600 mg by mouth 2 (two) times daily. 02/20/20  Yes Lezlie Lye, Meda Coffee, MD  hydrOXYzine (ATARAX/VISTARIL) 50 MG tablet TAKE 1 TABLET EVERY 6 HOURS AS NEEDED Patient taking differently: Take 50 mg by mouth every 6 (six) hours as needed for anxiety. 06/29/19  Yes Lezlie Lye, Meda Coffee, MD  icosapent Ethyl (VASCEPA) 1 g capsule Take 2 capsules (2 g total) by mouth 2 (two) times daily. 07/31/19  Yes Turner, Cornelious Bryant, MD  levothyroxine (SYNTHROID) 50 MCG tablet TAKE 1 TABLET BY MOUTH EVERY OTHER DAY. BEFORE BREAKFAST, ALTERNATED WITH TABLET. Patient taking differently: Take 50 mcg by mouth every other day. Before breakfast 11/04/19  Yes Lezlie Lye, Meda Coffee, MD  levothyroxine (SYNTHROID) 75 MCG tablet Take 75 mcg by  mouth every other day. Before breakfast   Yes [provider]  metFORMIN (GLUCOPHAGE) 500 MG tablet Take 1 tablet (500 mg total) by mouth daily with breakfast. 02/26/20  Yes Lezlie Lye, Meda Coffee, MD  prazosin (MINIPRESS) 2 MG capsule TAKE 1 CAPSULE BY MOUTH AT BEDTIME AS NEEDED. 02/08/20  Yes Lezlie Lye, Meda Coffee, MD  prazosin (MINIPRESS) 5 MG capsule TAKE 2 TO 3 CAPSULES AT BEDTIME Patient taking differently: Take 5-10 mg by mouth at bedtime as needed. 09/16/19  Yes Lezlie Lye, Meda Coffee, MD  Probiotic Product (ALIGN) 4 MG CAPS Take 1 capsule (4 mg total) by mouth daily. 04/17/20  Yes Jacalyn Lefevre, MD  QUEtiapine (SEROQUEL) 300 MG tablet TAKE 1 TABLET BY MOUTH EVERYDAY AT BEDTIME Patient taking differently: Take 300 mg by mouth at bedtime. 10/08/19  Yes Lezlie Lye, Meda Coffee, MD  rizatriptan (MAXALT-MLT) 10 MG disintegrating tablet Take 1 tablet (10 mg total) by mouth as needed for migraine. May repeat in 2 hours if needed 02/18/20  Yes Lezlie Lye, Meda Coffee, MD  diclofenac sodium (VOLTAREN) 1 % GEL Apply 2 g topically daily as needed (for knee pain). Patient not taking: No sig reported 08/23/18   Lezlie Lye, Meda Coffee, MD  ondansetron (ZOFRAN ODT) 4 MG disintegrating tablet Take 1 tablet (4 mg total) by mouth every 8 (eight) hours as needed for nausea or vomiting. Patient not taking: Reported on 04/29/2020 04/17/20   Jacalyn Lefevre, MD  QUEtiapine (SEROQUEL) 50 MG tablet TAKE 1 TABLET AT BEDTIME AS NEEDED Patient not taking: No sig reported 02/06/20   Lezlie Lye, Meda Coffee, MD  QUEtiapine (SEROQUEL) 50 MG tablet TAKE 1 TABLET AT BEDTIME AS NEEDED 09/13/19   Lezlie Lye, Meda Coffee, MD    Physical Exam: Vitals:   04/29/20 0600 04/29/20 0615 04/29/20 0630 04/29/20 0645  BP: 131/72 110/65 137/64 128/67  Pulse: (!) 111 (!) 108 (!) 105 (!) 101  Resp: (!) 24 (!) 26 17 (!) 23  Temp:      TempSrc:      SpO2: 97% 96% 97% 94%  Weight:      Height:        General: 64 y.o. female resting in  bed in NAD Eyes: PERRL, normal sclera ENMT: Nares patent w/o discharge, orophaynx clear, dentition normal, ears w/o discharge/lesions/ulcers Neck: Supple, trachea midline Cardiovascular: RRR, +S1, S2, no m/g/r, equal pulses throughout Respiratory: CTABL, no w/r/r, normal WOB GI: BS+, ND, RLQ TTP mild, no masses noted, no organomegaly noted MSK: No e/c/c Skin: No rashes, bruises, ulcerations noted Neuro: A&O x 3, no focal deficits Psyc: Appropriate interaction and affect, calm/cooperative  Labs on Admission: I have personally reviewed following labs and imaging studies  CBC: Recent Labs  Lab 04/28/20 2216  WBC 15.3*  HGB 12.5  HCT 38.5  MCV 79.9*  PLT 229   Basic Metabolic Panel: Recent Labs  Lab 04/28/20 2216  NA 130*  K 3.8  CL 95*  CO2 22  GLUCOSE 136*  BUN 14  CREATININE 1.57*  CALCIUM 8.7*   GFR: Estimated Creatinine Clearance: 35.7 mL/min (A) (by C-G formula based on SCr of 1.57 mg/dL (H)). Liver Function Tests: Recent Labs  Lab 04/28/20 2216  AST 32  ALT 27  ALKPHOS 117  BILITOT 0.6  PROT 7.5  ALBUMIN 3.6   Recent Labs  Lab 04/28/20 2216  LIPASE 19   No results for input(s): AMMONIA in the last 168 hours. Coagulation Profile: No results for input(s): INR, PROTIME in the last 168 hours. Cardiac Enzymes: No results for input(s): CKTOTAL, CKMB, CKMBINDEX, TROPONINI in the last 168 hours. BNP (last 3 results) Recent Labs    06/04/19 1416  PROBNP 73   HbA1C: No results for input(s): HGBA1C in the last 72 hours. CBG: No results for input(s): GLUCAP in the last 168 hours. Lipid Profile: No results for input(s): CHOL, HDL, LDLCALC, TRIG, CHOLHDL, LDLDIRECT in the last 72 hours. Thyroid Function Tests: No results for input(s): TSH, T4TOTAL, FREET4, T3FREE, THYROIDAB in the last 72 hours. Anemia Panel: No results for input(s): VITAMINB12, FOLATE, FERRITIN, TIBC, IRON, RETICCTPCT in the last 72 hours. Urine analysis:  Component Value  Date/Time   COLORURINE YELLOW 04/28/2020 2208   APPEARANCEUR CLOUDY (A) 04/28/2020 2208   LABSPEC 1.010 04/28/2020 2208   PHURINE 6.0 04/28/2020 2208   GLUCOSEU NEGATIVE 04/28/2020 2208   HGBUR SMALL (A) 04/28/2020 2208   BILIRUBINUR NEGATIVE 04/28/2020 2208   BILIRUBINUR negative 03/29/2018 0927   BILIRUBINUR neg 12/15/2014 1059   KETONESUR NEGATIVE 04/28/2020 2208   PROTEINUR 30 (A) 04/28/2020 2208   UROBILINOGEN 0.2 03/29/2018 0927   UROBILINOGEN 1.0 12/03/2014 0648   NITRITE NEGATIVE 04/28/2020 2208   LEUKOCYTESUR LARGE (A) 04/28/2020 2208    Radiological Exams on Admission: DG Chest Port 1 View  Result Date: 04/29/2020 CLINICAL DATA:  Sepsis, fever, UTI EXAM: PORTABLE CHEST 1 VIEW COMPARISON:  09/26/2018 FINDINGS: Heart and mediastinal contours are within normal limits. No focal opacities or effusions. No acute bony abnormality. IMPRESSION: No active disease. Electronically Signed   By: Charlett Nose M.D.   On: 04/29/2020 01:23    EKG: Independently reviewed. Sinus, no st elevation  Assessment/Plan Sepsis secondary to UTI Lactic acidosis Abdominal pain     - admit to inpatient     - continue rocephin, fluids     - UCx pending, blood culture pending     - follow lactic acid  CKD 3b     - baseline Scr 1.4 to 1.6; she is at baseline     - watch for nephrotoxins  Hyponatremia     - fluids, follow  HLD     - continue statin, vascepa  Hypothyroidism     - continue synthroid  Anxiety     - continue wellbutrin, buspar, cymbalata  DM2     - hold metformin     - SSI, DM2 diet, glucose checks  DVT prophylaxis: lovenox  Code Status: FULL  Family Communication: None at bedside  Consults called: None  Status is: Inpatient  Remains inpatient appropriate because:Inpatient level of care appropriate due to severity of illness   Dispo: The patient is from: Home              Anticipated d/c is to: Home              Anticipated d/c date is: 2 days               Patient currently is not medically stable to d/c.  Teddy Spike DO [facility]  If 7PM-7AM, please contact night-coverage [email or web address]  04/29/2020, 7:17 AM

## 2020-04-29 NOTE — ED Provider Notes (Signed)
[facility]-EMERGENCY DEPT Provider Note   CSN: [id number] Arrival date & time: 04/28/20  2140     History Chief Complaint  Patient presents with  . Abdominal Pain    Jennifer Davidson is a 64 y.o. female.  The history is provided by the patient and medical records.  Abdominal Pain Associated symptoms: dysuria, nausea and vomiting    64 y.o. F with hx of chronic back pain, GERD, HLP, HTN, hypothyroidism, sleep apnea, depression, presenting to the ED for feeling unwell for the past month. Seen in ED on 04/17/20 for same with negative work-up including CT scan.  States symptoms have persisted, now has developed fever, chills, and urinary symptoms.  States she feels dysuria and frequency.  Does have some lower abdominal discomfort with nausea/vomiting.  States trouble tolerating PO because of that.  Febrile, tachycardic on arrival to ED.  Past Medical History:  Diagnosis Date  . Acute encephalopathy 06/25/2016  . Anxiety   . Cataract 08/31/2017   left eye  . Cholesterol serum increased   . Chronic back pain    chronic Rt low back pain. s/p L4-5 fusion. failed Rt facet injections. poss due to Rt SI joint dysfunction.  . Chronic kidney disease   . Depression   . Diastolic heart failure (HCC)   . GERD (gastroesophageal reflux disease)   . Hyperlipidemia   . Hyperparathyroidism (HCC)   . Hypertension   . Hypothyroidism   . Migraine   . Neuromuscular disorder (HCC)   . Seizures (HCC)    3 years ago,06/20/19  . Sleep apnea    cpap  . Syncope 08/30/2015  . Tachycardia   . Trochanteric bursitis of both hips 2012   Confirmed on MRI    Patient Active Problem List   Diagnosis Date Noted  . Rotator cuff tear arthropathy of left shoulder 10/20/2018  . Arthritis of left acromioclavicular joint 10/20/2018  . Osteoarthritis of left shoulder 10/20/2018  . URI (upper respiratory infection) 09/27/2018  . Community acquired pneumonia of left lower lobe of lung 09/25/2018   . Head injury 09/08/2018  . Fall as cause of accidental injury at home as place of occurrence 09/08/2018  . Hallucination, visual 09/14/2017  . Prediabetes 09/14/2017  . Insomnia due to other mental disorder 09/14/2017  . Dream anxiety disorder 09/14/2017  . Vivid dream 09/14/2017  . Altered mental status 06/23/2017  . Irritable bowel syndrome with diarrhea 04/25/2017  . Sacroiliac joint dysfunction of right side 03/17/2017  . 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  . Seizure-like activity (HCC) 09/14/2016  . Moderate episode of recurrent major depressive disorder (HCC) 08/31/2016  . PTSD (post-traumatic stress disorder) 08/31/2016  . Recurrent UTI 06/25/2016  . Severe recurrent depression with psychosis (HCC) 06/25/2016  . GERD (gastroesophageal reflux disease) 11/13/2015  . Generalized anxiety disorder 06/14/2015  . Chronic pain of right lower extremity 05/14/2015  . Neuropathy of lower extremity 05/14/2015  . Headache, migraine 01/17/2015  . Severe recurrent major depressive disorder with psychotic features (HCC)   . Major depression, recurrent, chronic (HCC) 12/02/2014  . Mood disorder (HCC) 12/01/2014  . Chronic back pain   . Hyperparathyroidism, 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  . Neuromuscular disorder (HCC) 07/31/2014  . Lower back pain 09/21/2013  . Diastolic heart failure (HCC) 03/17/2012  . Syncope and collapse 02/12/2012  . Chronic kidney disease 02/04/2012  . Edema 02/04/2012  . Tachycardia 02/04/2012  . Encounter for long-term (current) use of medications 02/04/2012  . Anxiety and  depression 02/04/2012  . Hypothyroid 02/04/2012  . Mixed hyperlipidemia 02/04/2012    Past Surgical History:  Procedure Laterality Date  . ABDOMINAL HYSTERECTOMY    . APPENDECTOMY  1986  . BACK SURGERY     L4-5 fusion  . CATARACT EXTRACTION    . KNEE SURGERY    . SHOULDER ARTHROSCOPY WITH SUBACROMIAL DECOMPRESSION Left 10/20/2018   Procedure: SHOULDER  ARTHROSCOPY, ROTATOR CUFF DEBRIDEMENT, GLENOHUMERAL JOINT DEBRIDEMENT, ACROMIOPLASTY, DISTAL CLAVICLE RESECTION;  Surgeon: Jodi Geralds, MD;  Location: [facility];  Service: Orthopedics;  Laterality: Left;     OB History   No obstetric history on file.     Family History  Problem Relation Age of Onset  . Diabetes Mother   . Heart disease Mother   . Kidney disease Mother   . Thyroid disease Mother   . Heart disease Father   . Seizures Father   . COPD Father   . Irritable bowel syndrome Father   . Cancer Maternal Grandmother   . Diabetes Sister   . Diabetes Brother   . Colon cancer Neg Hx   . Stomach cancer Neg Hx   . Colon polyps Neg Hx   . Esophageal cancer Neg Hx   . Rectal cancer Neg Hx     Social History   Tobacco Use  . Smoking status: Never Smoker  . Smokeless tobacco: Never Used  Vaping Use  . Vaping Use: Never used  Substance Use Topics  . Alcohol use: No  . Drug use: No    Comment: 08-31-2016 PER PT NO     Home Medications Prior to Admission medications   Medication Sig Start Date End Date Taking? Authorizing Provider  albuterol (VENTOLIN HFA) 108 (90 Base) MCG/ACT inhaler TAKE 2 PUFFS BY MOUTH EVERY 6 HOURS AS NEEDED FOR WHEEZE OR SHORTNESS OF BREATH Patient taking differently: Inhale 2 puffs into the lungs every 6 (six) hours as needed for wheezing or shortness of breath.  10/28/19   Noni Saupe, MD  aspirin EC 81 MG tablet Take 1 tablet (81 mg total) by mouth daily. 07/04/19   Quintella Reichert, MD  atorvastatin (LIPITOR) 20 MG tablet TAKE 1 TABLET BY MOUTH EVERY DAY Patient taking differently: Take 20 mg by mouth daily.  01/29/20   Lezlie Lye, Meda Coffee, MD  buPROPion (WELLBUTRIN XL) 150 MG 24 hr tablet Take 1 tablet (150 mg total) by mouth daily. 02/18/20   Lezlie Lye, Meda Coffee, MD  busPIRone (BUSPAR) 7.5 MG tablet Take 1 tablet (7.5 mg total) by mouth 3 (three) times daily. 02/18/20   Noni Saupe, MD  diclofenac sodium (VOLTAREN) 1 % GEL Apply  2 g topically daily as needed (for knee pain). Patient not taking: Reported on 03/27/2020 08/23/18   Lezlie Lye, Meda Coffee, MD  DULoxetine (CYMBALTA) 60 MG capsule TAKE 1 CAPSULE EVERY DAY Patient taking differently: Take 60 mg by mouth daily.  02/06/20   Lezlie Lye, Meda Coffee, MD  Erenumab-aooe (AIMOVIG) 70 MG/ML SOAJ Inject 70 mg into the skin every 28 (twenty-eight) days. 04/08/20   Drema Dallas, DO  esomeprazole (NEXIUM) 40 MG capsule TAKE 1 CAPSULE EVERY DAY Patient taking differently: Take 40 mg by mouth daily.  09/12/19   Noni Saupe, MD  famotidine (PEPCID) 40 MG tablet Take 40 mg by mouth daily. 02/12/20   [provider]  fluticasone (FLONASE) 50 MCG/ACT nasal spray PLACE 2 SPRAYS AT BEDTIME INTO BOTH NOSTRILS. Patient taking differently: Place 2  sprays into both nostrils daily.  08/23/18   Noni Saupe, MD  gabapentin (NEURONTIN) 400 MG capsule Take 1 capsule (400 mg total) by mouth at bedtime. Patient taking differently: Take 600 mg by mouth 2 (two) times daily.  02/20/20   Noni Saupe, MD  hydrOXYzine (ATARAX/VISTARIL) 50 MG tablet TAKE 1 TABLET EVERY 6 HOURS AS NEEDED Patient taking differently: Take 50 mg by mouth every 6 (six) hours as needed for anxiety.  06/29/19   Lezlie Lye, Meda Coffee, MD  icosapent Ethyl (VASCEPA) 1 g capsule Take 2 capsules (2 g total) by mouth 2 (two) times daily. 07/31/19   Quintella Reichert, MD  levothyroxine (SYNTHROID) 50 MCG tablet TAKE 1 TABLET BY MOUTH EVERY OTHER DAY. BEFORE BREAKFAST, ALTERNATED WITH TABLET. Patient taking differently: Take 50 mcg by mouth every other day. Before breakfast 11/04/19   Lezlie Lye, Meda Coffee, MD  levothyroxine (SYNTHROID) 75 MCG tablet Take 75 mcg by mouth every other day. Before breakfast    [provider]  metFORMIN (GLUCOPHAGE) 500 MG tablet Take 1 tablet (500 mg total) by mouth daily with breakfast. 02/26/20   Lezlie Lye, Meda Coffee, MD  ondansetron (ZOFRAN ODT) 4 MG  disintegrating tablet Take 1 tablet (4 mg total) by mouth every 8 (eight) hours as needed for nausea or vomiting. 04/17/20   Jacalyn Lefevre, MD  prazosin (MINIPRESS) 2 MG capsule TAKE 1 CAPSULE BY MOUTH AT BEDTIME AS NEEDED. 02/08/20   Lezlie Lye, Meda Coffee, MD  prazosin (MINIPRESS) 5 MG capsule TAKE 2 TO 3 CAPSULES AT BEDTIME Patient taking differently: Take 5-10 mg by mouth at bedtime as needed.  09/16/19   Noni Saupe, MD  Probiotic Product (ALIGN) 4 MG CAPS Take 1 capsule (4 mg total) by mouth daily. 04/17/20   Jacalyn Lefevre, MD  QUEtiapine (SEROQUEL) 300 MG tablet TAKE 1 TABLET BY MOUTH EVERYDAY AT BEDTIME Patient taking differently: Take 300 mg by mouth at bedtime.  10/08/19   Lezlie Lye, Meda Coffee, MD  QUEtiapine (SEROQUEL) 50 MG tablet TAKE 1 TABLET AT BEDTIME AS NEEDED Patient not taking: Reported on 03/27/2020 02/06/20   Lezlie Lye, Meda Coffee, MD  rizatriptan (MAXALT-MLT) 10 MG disintegrating tablet Take 1 tablet (10 mg total) by mouth as needed for migraine. May repeat in 2 hours if needed 02/18/20   Lezlie Lye, Meda Coffee, MD  QUEtiapine (SEROQUEL) 50 MG tablet TAKE 1 TABLET AT BEDTIME AS NEEDED 09/13/19   Lezlie Lye, Meda Coffee, MD    Allergies    Tramadol and Trazodone and nefazodone  Review of Systems   Review of Systems  Gastrointestinal: Positive for abdominal pain, nausea and vomiting.  Genitourinary: Positive for dysuria and frequency.  All other systems reviewed and are negative.   Physical Exam Updated Vital Signs BP (!) 131/95 (BP Location: Right Arm)   Pulse (!) 116   Temp (!) 103.2 F (39.6 C) (Oral)   Resp 16   Ht 5\' 3"  (1.6 m)   Wt 81.6 kg   SpO2 98%   BMI 31.89 kg/m   Physical Exam Vitals and nursing note reviewed.  Constitutional:      Appearance: She is well-developed and well-nourished.     Comments: Overall appears well  HENT:     Head: Normocephalic and atraumatic.     Mouth/Throat:     Mouth: Oropharynx is clear and moist.  Eyes:      Extraocular Movements: EOM normal.     Conjunctiva/sclera:  Conjunctivae normal.     Pupils: Pupils are equal, round, and reactive to light.  Cardiovascular:     Rate and Rhythm: Normal rate and regular rhythm.     Heart sounds: Normal heart sounds.  Pulmonary:     Effort: Pulmonary effort is normal.     Breath sounds: Normal breath sounds.  Abdominal:     General: Bowel sounds are normal.     Palpations: Abdomen is soft.     Tenderness: There is no abdominal tenderness. There is no rebound.     Comments: Soft, grossly non-tender, no peritoneal signs  Musculoskeletal:        General: Normal range of motion.     Cervical back: Normal range of motion.  Skin:    General: Skin is warm and dry.  Neurological:     Mental Status: She is alert and oriented to person, place, and time.  Psychiatric:        Mood and Affect: Mood and affect normal.     ED Results / Procedures / Treatments   Labs (all labs ordered are listed, but only abnormal results are displayed) Labs Reviewed  COMPREHENSIVE METABOLIC PANEL - Abnormal; Notable for the following components:      Result Value   Sodium 130 (*)    Chloride 95 (*)    Glucose, Bld 136 (*)    Creatinine, Ser 1.57 (*)    Calcium 8.7 (*)    GFR, Estimated 36 (*)    All other components within normal limits  CBC - Abnormal; Notable for the following components:   WBC 15.3 (*)    MCV 79.9 (*)    MCH 25.9 (*)    RDW 16.0 (*)    All other components within normal limits  URINALYSIS, ROUTINE W REFLEX MICROSCOPIC - Abnormal; Notable for the following components:   APPearance CLOUDY (*)    Hgb urine dipstick SMALL (*)    Protein, ur 30 (*)    Leukocytes,Ua LARGE (*)    WBC, UA >50 (*)    Bacteria, UA MANY (*)    All other components within normal limits  LACTIC ACID, PLASMA - Abnormal; Notable for the following components:   Lactic Acid, Venous 2.9 (*)    All other components within normal limits  LACTIC ACID, PLASMA - Abnormal; Notable  for the following components:   Lactic Acid, Venous 2.0 (*)    All other components within normal limits  RESP PANEL BY RT-PCR (FLU A&B, COVID) ARPGX2  CULTURE, BLOOD (ROUTINE X 2)  CULTURE, BLOOD (ROUTINE X 2)  URINE CULTURE  LIPASE, BLOOD    EKG EKG Interpretation  Date/Time:  Tuesday April 29 2020 01:35:40 EST Ventricular Rate:  82 PR Interval:    QRS Duration: 103 QT Interval:  399 QTC Calculation: 466 R Axis:   37 Text Interpretation: Sinus rhythm Consider left atrial enlargement Abnormal R-wave progression, early transition No significant change since 09/25/2018 Confirmed by Geoffery LyonseLo, Douglas ([id number]) on 04/29/2020 1:38:04 AM   Radiology DG Chest Port 1 View  Result Date: 04/29/2020 CLINICAL DATA:  Sepsis, fever, UTI EXAM: PORTABLE CHEST 1 VIEW COMPARISON:  09/26/2018 FINDINGS: Heart and mediastinal contours are within normal limits. No focal opacities or effusions. No acute bony abnormality. IMPRESSION: No active disease. Electronically Signed   By: Charlett NoseKevin  Dover M.D.   On: 04/29/2020 01:23    Procedures Procedures (including critical care time)  CRITICAL CARE Performed by: Garlon HatchetLisa M Yago Ludvigsen   Total critical care time: 40 minutes  Critical care time was exclusive of separately billable procedures and treating other patients.  Critical care was necessary to treat or prevent imminent or life-threatening deterioration.  Critical care was time spent personally by me on the following activities: development of treatment plan with patient and/or surrogate as well as nursing, discussions with consultants, evaluation of patient's response to treatment, examination of patient, obtaining history from patient or surrogate, ordering and performing treatments and interventions, ordering and review of laboratory studies, ordering and review of radiographic studies, pulse oximetry and re-evaluation of patient's condition.   Medications Ordered in ED Medications  lactated ringers  infusion ( Intravenous New Bag/Given 04/29/20 0246)  cefTRIAXone (ROCEPHIN) 1 g in sodium chloride 0.9 % 100 mL IVPB (0 g Intravenous Stopped 04/29/20 0246)  acetaminophen (TYLENOL) tablet 650 mg (has no administration in time range)  acetaminophen (TYLENOL) tablet 650 mg (650 mg Oral Given 04/28/20 2216)  lactated ringers bolus 1,000 mL (0 mLs Intravenous Stopped 04/29/20 0350)    And  lactated ringers bolus 1,000 mL (0 mLs Intravenous Stopped 04/29/20 0350)    And  lactated ringers bolus 500 mL (0 mLs Intravenous Stopped 04/29/20 0246)    ED Course  I have reviewed the triage vital signs and the nursing notes.  Pertinent labs & imaging results that were available during my care of the patient were reviewed by me and considered in my medical decision making (see chart for details).    MDM Rules/Calculators/A&P  64 year old female presenting to the ED with fever and generalized feeling unwell for the past month.  Recent work-up at [facility] for same with negative labs and CT scan.  States she has had no improvement but now has fever and urinary symptoms.  She is febrile, tachycardic, but hemodynamically stable on arrival.  Abdomen soft, non-tender on my exam.  No peritoneal signs.  Labs with leukocytosis and initial lactate of 2.9.  Code sepsis was activated.  Source appears to be urinary as she has many bacteria, >50 WBC, large leuks in UA.  Will send for culture.  CXR clear.  covid screen negative.  Patient give 30cc/kg bolus along with IV rocephin.  Will admit for ongoing care.  As her abdominal exam today is overall benign without peritoneal signs, I do not think repeat CT will ultimately change her management today so will hold off on this for now.  Discussed with hospitalist, Dr. Toniann Fail-- will admit for ongoing care.  Final Clinical Impression(s) / ED Diagnoses Final diagnoses:  SIRS (systemic inflammatory response syndrome) (HCC)  Acute cystitis without hematuria    Rx / DC  Orders ED Discharge Orders    None       Garlon Hatchet, [name] 04/29/20 1610    Geoffery Lyons, MD 04/29/20 [phone]

## 2020-04-30 DIAGNOSIS — A4151 Sepsis due to Escherichia coli [E. coli]: Principal | ICD-10-CM

## 2020-04-30 DIAGNOSIS — N3000 Acute cystitis without hematuria: Secondary | ICD-10-CM

## 2020-04-30 DIAGNOSIS — N3090 Cystitis, unspecified without hematuria: Secondary | ICD-10-CM

## 2020-04-30 LAB — COMPREHENSIVE METABOLIC PANEL
ALT: 25 U/L (ref 0–44)
AST: 30 U/L (ref 15–41)
Albumin: 2.7 g/dL — ABNORMAL LOW (ref 3.5–5.0)
Alkaline Phosphatase: 102 U/L (ref 38–126)
Anion gap: 8 (ref 5–15)
BUN: 12 mg/dL (ref 8–23)
CO2: 27 mmol/L (ref 22–32)
Calcium: 8 mg/dL — ABNORMAL LOW (ref 8.9–10.3)
Chloride: 101 mmol/L (ref 98–111)
Creatinine, Ser: 1.28 mg/dL — ABNORMAL HIGH (ref 0.44–1.00)
GFR, Estimated: 46 mL/min — ABNORMAL LOW (ref >60)
Glucose, Bld: 109 mg/dL — ABNORMAL HIGH (ref 70–99)
Potassium: 3.4 mmol/L — ABNORMAL LOW (ref 3.5–5.1)
Sodium: 136 mmol/L (ref 135–145)
Total Bilirubin: 0.3 mg/dL (ref 0.3–1.2)
Total Protein: 5.9 g/dL — ABNORMAL LOW (ref 6.5–8.1)

## 2020-04-30 LAB — CBC
HCT: 29.3 % — ABNORMAL LOW (ref 36.0–46.0)
Hemoglobin: 9.7 g/dL — ABNORMAL LOW (ref 12.0–15.0)
MCH: 26.3 pg (ref 26.0–34.0)
MCHC: 33.1 g/dL (ref 30.0–36.0)
MCV: 79.4 fL — ABNORMAL LOW (ref 80.0–100.0)
Platelets: 176 10*3/uL (ref 150–400)
RBC: 3.69 MIL/uL — ABNORMAL LOW (ref 3.87–5.11)
RDW: 16 % — ABNORMAL HIGH (ref 11.5–15.5)
WBC: 10.8 10*3/uL — ABNORMAL HIGH (ref 4.0–10.5)
nRBC: 0 % (ref 0.0–0.2)

## 2020-04-30 LAB — MAGNESIUM: Magnesium: 1.5 mg/dL — ABNORMAL LOW (ref 1.7–2.4)

## 2020-04-30 LAB — GLUCOSE, CAPILLARY
Glucose-Capillary: 82 mg/dL (ref 70–99)
Glucose-Capillary: 92 mg/dL (ref 70–99)
Glucose-Capillary: 88 mg/dL (ref 70–99)
Glucose-Capillary: 137 mg/dL — ABNORMAL HIGH (ref 70–99)

## 2020-04-30 LAB — PROTIME-INR
INR: 1.1 (ref 0.8–1.2)
Prothrombin Time: 13.7 seconds (ref 11.4–15.2)

## 2020-04-30 LAB — CORTISOL-AM, BLOOD: Cortisol - AM: 17.1 ug/dL (ref 6.7–22.6)

## 2020-04-30 LAB — PROCALCITONIN: Procalcitonin: 1.36 ng/mL

## 2020-04-30 MED ORDER — POTASSIUM CHLORIDE CRYS ER 20 MEQ PO TBCR
40.0000 meq | EXTENDED_RELEASE_TABLET | Freq: Once | ORAL | Status: AC
  Administered 2020-04-30: 10:00:00 40 meq via ORAL
  Filled 2020-04-30: qty 2

## 2020-04-30 MED ORDER — MAGNESIUM SULFATE 4 GM/100ML IV SOLN
4.0000 g | Freq: Once | INTRAVENOUS | Status: AC
  Administered 2020-04-30: 20:00:00 4 g via INTRAVENOUS
  Filled 2020-04-30: qty 100

## 2020-04-30 MED ORDER — RIZATRIPTAN BENZOATE 10 MG PO TBDP: 10.0000 mg | ORAL_TABLET | ORAL | Status: DC | PRN

## 2020-04-30 MED ORDER — SUMATRIPTAN SUCCINATE 50 MG PO TABS
100.0000 mg | ORAL_TABLET | ORAL | Status: DC | PRN
  Administered 2020-05-01: 100 mg via ORAL
  Filled 2020-04-30 (×2): qty 2

## 2020-04-30 MED ORDER — KETOROLAC TROMETHAMINE 30 MG/ML IJ SOLN
30.0000 mg | Freq: Once | INTRAMUSCULAR | Status: AC
  Administered 2020-04-30: 12:00:00 30 mg via INTRAVENOUS
  Filled 2020-04-30: qty 1

## 2020-04-30 NOTE — Plan of Care (Signed)

## 2020-04-30 NOTE — TOC Progression Note (Signed)
Transition of Care [facility]) - Progression Note    Patient Details  Name: Jennifer Davidson MRN: [id number] Date of Birth: 11-11-1955  Transition of [facility]) CM/SW Contact  Geni Bers, RN Phone Number: 04/30/2020, 3:45 PM  Clinical Narrative:     TOC will continue to follow for discharge needs.   Expected Discharge Plan: Home/Self Care Barriers to Discharge: No Barriers Identified  Expected Discharge Plan and Services Expected Discharge Plan: Home/Self Care       Living arrangements for the past 2 months: Single Family Home                                       Social Determinants of Health (SDOH) Interventions    Readmission Risk Interventions No flowsheet data found.

## 2020-04-30 NOTE — Plan of Care (Signed)
  Problem: Clinical Measurements: Goal: Will remain free from infection Outcome: Progressing   Problem: Pain Managment: Goal: General experience of comfort will improve Outcome: Progressing   Problem: Safety: Goal: Ability to remain free from injury will improve Outcome: Progressing   Problem: Urinary Elimination: Goal: Signs and symptoms of infection will decrease Outcome: Progressing   Problem: Clinical Measurements: Goal: Diagnostic test results will improve Outcome: Completed/Met   Problem: Nutrition: Goal: Adequate nutrition will be maintained Outcome: Completed/Met

## 2020-04-30 NOTE — Progress Notes (Addendum)
PROGRESS NOTE    Jennifer Davidson  BUL:[id number] DOB: 02-17-1956 DOA: 04/29/2020 PCP: Patient, No Pcp Per   Brief Narrative: Taken from H&P. Jennifer Davidson is a 64 y.o. female with medical history significant of CKD3b, Anxiety, HLD, Hypothyroidism. Presenting with abdominal pain that started 1 month ago. Initially it was in the LLQ, but has migrated over to the RLQ over the last several weeks. It's sharp in episodes lasting 5 - 10 minutes. She has tried heating pads to help, but they have not. She has had N/V over the last 2 weeks. Anti-emetics provide temporary relief. She was seen in the ED about 12 days ago. Imaging for negative. She was given fluids and appeared to improve. She notes that over this past weekend, she had intermittent fevers and chills. She felt very poor. She tried to get into her PCP, but they are no longer available. So, she came to the ED. Urine and blood culture with E. coli.  Pending susceptibility.  Subjective: Patient was complaining of some nausea and headache.  She just received Tylenol about 15 minutes ago with no relief yet.  Asked to wait for another half an hour and we can give her something stronger if still having headaches.  Assessment & Plan:   Active Problems:   Sepsis (HCC)  Septic secondary to E. coli bacteremia and UTI.  Most likely urinary source.  She was started on ceftriaxone.  Lactic acidosis resolved.  Procalcitonin elevated at 1.36 1/4 Blood and urine cultures with E. coli-pending susceptibility results. -Continue with ceftriaxone. -She will need 2 weeks of antibiotics once susceptibility results are available.  CKD stage IIIb.  Creatinine appears to be around her baseline of 1.4-1.6.  It was 1.28 today. -Continue to monitor -Avoid nephrotoxins.  Mild hyponatremia.  Resolved.  Hypokalemia/hypomagnesemia. -Replete potassium and magnesium and monitor  Hyperlipidemia. -Continue statin and Vascepa.  Hypothyroidism. -Continue home dose of  Synthroid.  Anxiety.  No acute concern. -Continue home dose of Wellbutrin, BuSpar and Cymbalta.  Type 2 diabetes mellitus.  Holding home Metformin -Continue with SSI  Objective: Vitals:   04/29/20 1749 04/29/20 2149 04/30/20 0425 04/30/20 1217  BP: 138/62 137/68 125/64 126/70  Pulse: 80 83 94 71  Resp: 20 18 16 20   Temp:  99.5 F (37.5 C) 100 F (37.8 C) 98 F (36.7 C)  TempSrc:  Oral Oral Oral  SpO2: 97% 97% 93% 93%  Weight:      Height:        Intake/Output Summary (Last 24 hours) at 04/30/2020 1621 Last data filed at 04/30/2020 0522 Gross per 24 hour  Intake 520 ml  Output --  Net 520 ml   Filed Weights   04/28/20 2156 04/29/20 0145  Weight: 81.6 kg 81.6 kg    Examination:  General exam: Appears calm and comfortable  Respiratory system: Clear to auscultation. Respiratory effort normal. Cardiovascular system: S1 & S2 heard, RRR. No JVD, murmurs, rubs, gallops or clicks. Gastrointestinal system: Soft, nontender, nondistended, bowel sounds positive. Central nervous system: Alert and oriented. No focal neurological deficits.Symmetric 5 x 5 power. Extremities: No edema, no cyanosis, pulses intact and symmetrical. Skin: No rashes, lesions or ulcers Psychiatry: Judgement and insight appear normal. Mood & affect appropriate.    DVT prophylaxis: Lovenox Code Status: Full Family Communication: Discussed with patient Disposition Plan:  Status is: Inpatient  Remains inpatient appropriate because:Inpatient level of care appropriate due to severity of illness   Dispo: The patient is from: Home  Anticipated d/c is to: Home              Anticipated d/c date is: 1 day              Patient currently is not medically stable to d/c.   Consultants:   None  Procedures:  Antimicrobials:  Ceftriaxone  Data Reviewed: I have personally reviewed following labs and imaging studies  CBC: Recent Labs  Lab 04/28/20 2216 04/29/20 1016 04/30/20 0456  WBC  15.3* 15.7* 10.8*  HGB 12.5 10.8* 9.7*  HCT 38.5 32.5* 29.3*  MCV 79.9* 79.1* 79.4*  PLT 229 183 176   Basic Metabolic Panel: Recent Labs  Lab 04/28/20 2216 04/29/20 1016 04/30/20 0456  NA 130*  --  136  K 3.8  --  3.4*  CL 95*  --  101  CO2 22  --  27  GLUCOSE 136*  --  109*  BUN 14  --  12  CREATININE 1.57* 1.36* 1.28*  CALCIUM 8.7*  --  8.0*  MG  --   --  1.5*   GFR: Estimated Creatinine Clearance: 43.7 mL/min (A) (by C-G formula based on SCr of 1.28 mg/dL (H)). Liver Function Tests: Recent Labs  Lab 04/28/20 2216 04/30/20 0456  AST 32 30  ALT 27 25  ALKPHOS 117 102  BILITOT 0.6 0.3  PROT 7.5 5.9*  ALBUMIN 3.6 2.7*   Recent Labs  Lab 04/28/20 2216  LIPASE 19   No results for input(s): AMMONIA in the last 168 hours. Coagulation Profile: Recent Labs  Lab 04/30/20 0456  INR 1.1   Cardiac Enzymes: No results for input(s): CKTOTAL, CKMB, CKMBINDEX, TROPONINI in the last 168 hours. BNP (last 3 results) Recent Labs    06/04/19 1416  PROBNP 73   HbA1C: Recent Labs    04/29/20 1016  HGBA1C 5.7*   CBG: Recent Labs  Lab 04/29/20 1154 04/29/20 1623 04/29/20 2059 04/30/20 0730 04/30/20 1140  GLUCAP 109* 103* 119* 82 92   Lipid Profile: No results for input(s): CHOL, HDL, LDLCALC, TRIG, CHOLHDL, LDLDIRECT in the last 72 hours. Thyroid Function Tests: No results for input(s): TSH, T4TOTAL, FREET4, T3FREE, THYROIDAB in the last 72 hours. Anemia Panel: No results for input(s): VITAMINB12, FOLATE, FERRITIN, TIBC, IRON, RETICCTPCT in the last 72 hours. Sepsis Labs: Recent Labs  Lab 04/28/20 2223 04/29/20 0142 04/29/20 1016 04/29/20 1219 04/30/20 0456  PROCALCITON  --   --   --   --  1.36  LATICACIDVEN 2.9* 2.0* 0.8 1.6  --     Recent Results (from the past 240 hour(s))  Resp Panel by RT-PCR (Flu A&B, Covid) Nasopharyngeal Swab     Status: None   Collection Time: 04/28/20 10:09 PM   Specimen: Nasopharyngeal Swab; Nasopharyngeal(NP) swabs in  vial transport medium  Result Value Ref Range Status   SARS Coronavirus 2 by RT PCR NEGATIVE NEGATIVE Final    Comment: (NOTE) SARS-CoV-2 target nucleic acids are NOT DETECTED.  The SARS-CoV-2 RNA is generally detectable in upper respiratory specimens during the acute phase of infection. The lowest concentration of SARS-CoV-2 viral copies this assay can detect is 138 copies/mL. A negative result does not preclude SARS-Cov-2 infection and should not be used as the sole basis for treatment or other patient management decisions. A negative result may occur with  improper specimen collection/handling, submission of specimen other than nasopharyngeal swab, presence of viral mutation(s) within the areas targeted by this assay, and inadequate number of viral copies(<138 copies/mL). A negative  result must be combined with clinical observations, patient history, and epidemiological information. The expected result is Negative.  Fact Sheet for Patients:  BloggerCourse.com  Fact Sheet for Healthcare Providers:  SeriousBroker.it  This test is no t yet approved or cleared by the [place] FDA and  has been authorized for detection and/or diagnosis of SARS-CoV-2 by FDA under an Emergency Use Authorization (EUA). This EUA will remain  in effect (meaning this test can be used) for the duration of the COVID-19 declaration under Section 564(b)(1) of the Act, 21 U.S.C.section 360bbb-3(b)(1), unless the authorization is terminated  or revoked sooner.       Influenza A by PCR NEGATIVE NEGATIVE Final   Influenza B by PCR NEGATIVE NEGATIVE Final    Comment: (NOTE) The Xpert Xpress SARS-CoV-2/FLU/RSV plus assay is intended as an aid in the diagnosis of influenza from Nasopharyngeal swab specimens and should not be used as a sole basis for treatment. Nasal washings and aspirates are unacceptable for Xpert Xpress SARS-CoV-2/FLU/RSV testing.  Fact  Sheet for Patients: BloggerCourse.com  Fact Sheet for Healthcare Providers: SeriousBroker.it  This test is not yet approved or cleared by the [place] FDA and has been authorized for detection and/or diagnosis of SARS-CoV-2 by FDA under an Emergency Use Authorization (EUA). This EUA will remain in effect (meaning this test can be used) for the duration of the COVID-19 declaration under Section 564(b)(1) of the Act, 21 U.S.C. section 360bbb-3(b)(1), unless the authorization is terminated or revoked.  Performed at [facility], [address], [place], [place] [address]   Blood culture (routine x 2)     Status: None (Preliminary result)   Collection Time: 04/29/20  1:42 AM   Specimen: BLOOD  Result Value Ref Range Status   Specimen Description   Final    BLOOD LEFT ANTECUBITAL Performed at [facility], [address] [place], [place] [address]    Special Requests   Final    BOTTLES DRAWN AEROBIC AND ANAEROBIC Blood Culture adequate volume Performed at [facility], [address], [place], [place] [address]    Culture   Final    NO GROWTH 1 DAY Performed at [facility], [address], [place], [place] [address]    Report Status PENDING  Incomplete  Blood culture (routine x 2)     Status: Abnormal (Preliminary result)   Collection Time: 04/29/20  1:42 AM   Specimen: BLOOD  Result Value Ref Range Status   Specimen Description   Final    BLOOD RIGHT ANTECUBITAL Performed at [facility], [address] [place], [place] [address]    Special Requests   Final    BOTTLES DRAWN AEROBIC AND ANAEROBIC Blood Culture adequate volume Performed at [facility], [address], [place], [place] [address]    Culture  Setup Time   Final    ANAEROBIC BOTTLE ONLY Organism ID to follow GRAM NEGATIVE RODS CRITICAL RESULT CALLED TO,  READ BACK BY AND VERIFIED WITH: PHARMD J GADHIA 04/29/20 AT 1948 SK     Culture (A)  Final    ESCHERICHIA COLI SUSCEPTIBILITIES TO FOLLOW Performed at [facility], [address], [place], [place] [address]    Report Status PENDING  Incomplete  Blood Culture ID Panel (Reflexed)     Status: Abnormal   Collection Time: 04/29/20  1:42 AM  Result Value Ref Range Status   Enterococcus faecalis NOT DETECTED NOT DETECTED Final  Enterococcus Faecium NOT DETECTED NOT DETECTED Final   Listeria monocytogenes NOT DETECTED NOT DETECTED Final   Staphylococcus species NOT DETECTED NOT DETECTED Final   Staphylococcus aureus (BCID) NOT DETECTED NOT DETECTED Final   Staphylococcus epidermidis NOT DETECTED NOT DETECTED Final   Staphylococcus lugdunensis NOT DETECTED NOT DETECTED Final   Streptococcus species NOT DETECTED NOT DETECTED Final   Streptococcus agalactiae NOT DETECTED NOT DETECTED Final   Streptococcus pneumoniae NOT DETECTED NOT DETECTED Final   Streptococcus pyogenes NOT DETECTED NOT DETECTED Final   A.calcoaceticus-baumannii NOT DETECTED NOT DETECTED Final   Bacteroides fragilis NOT DETECTED NOT DETECTED Final   Enterobacterales DETECTED (A) NOT DETECTED Final    Comment: Enterobacterales represent a large order of gram negative bacteria, not a single organism. CRITICAL RESULT CALLED TO, READ BACK BY AND VERIFIED WITH: PHARMD J GADHIA 04/29/20 AT 1948 SK    Enterobacter cloacae complex NOT DETECTED NOT DETECTED Final   Escherichia coli DETECTED (A) NOT DETECTED Final    Comment: CRITICAL RESULT CALLED TO, READ BACK BY AND VERIFIED WITH: PHARMD J GADHIA 04/29/20 AT 1948 SK    Klebsiella aerogenes NOT DETECTED NOT DETECTED Final   Klebsiella oxytoca NOT DETECTED NOT DETECTED Final   Klebsiella pneumoniae NOT DETECTED NOT DETECTED Final   Proteus species NOT DETECTED NOT DETECTED Final   Salmonella species NOT DETECTED NOT DETECTED Final   Serratia marcescens NOT DETECTED NOT  DETECTED Final   Haemophilus influenzae NOT DETECTED NOT DETECTED Final   Neisseria meningitidis NOT DETECTED NOT DETECTED Final   Pseudomonas aeruginosa NOT DETECTED NOT DETECTED Final   Stenotrophomonas maltophilia NOT DETECTED NOT DETECTED Final   Candida albicans NOT DETECTED NOT DETECTED Final   Candida auris NOT DETECTED NOT DETECTED Final   Candida glabrata NOT DETECTED NOT DETECTED Final   Candida krusei NOT DETECTED NOT DETECTED Final   Candida parapsilosis NOT DETECTED NOT DETECTED Final   Candida tropicalis NOT DETECTED NOT DETECTED Final   Cryptococcus neoformans/gattii NOT DETECTED NOT DETECTED Final   CTX-M ESBL NOT DETECTED NOT DETECTED Final   Carbapenem resistance IMP NOT DETECTED NOT DETECTED Final   Carbapenem resistance KPC NOT DETECTED NOT DETECTED Final   Carbapenem resistance NDM NOT DETECTED NOT DETECTED Final   Carbapenem resist OXA 48 LIKE NOT DETECTED NOT DETECTED Final   Carbapenem resistance VIM NOT DETECTED NOT DETECTED Final    Comment: Performed at [facility], [address] [place], [place] [address]  Urine culture     Status: Abnormal (Preliminary result)   Collection Time: 04/29/20  1:49 AM   Specimen: Urine, Random  Result Value Ref Range Status   Specimen Description   Final    URINE, RANDOM Performed at [facility], [address], [place], [place] [address]    Special Requests   Final    NONE Performed at [facility], [address] [place], [place] [address]    Culture (A)  Final    >=100,000 COLONIES/mL ESCHERICHIA COLI SUSCEPTIBILITIES TO FOLLOW Performed at [facility], [address] [place], [place] [address]    Report Status PENDING  Incomplete     Radiology Studies: DG Chest Port 1 View  Result Date: 04/29/2020 CLINICAL DATA:  Sepsis, fever, UTI EXAM: PORTABLE CHEST 1 VIEW COMPARISON:  09/26/2018 FINDINGS: Heart and mediastinal contours are within normal limits. No focal  opacities or effusions. No acute bony abnormality. IMPRESSION: No active disease. Electronically Signed   By: Charlett Nose  M.D.   On: 04/29/2020 01:23    Scheduled Meds:  aspirin EC  81 mg Oral Daily   atorvastatin  20 mg Oral Daily   buPROPion  150 mg Oral Daily   busPIRone  7.5 mg Oral TID   DULoxetine  60 mg Oral Daily   enoxaparin (LOVENOX) injection  40 mg Subcutaneous Q24H   gabapentin  600 mg Oral BID   insulin aspart  0-15 Units Subcutaneous TID WC   insulin aspart  0-5 Units Subcutaneous QHS   levothyroxine  50 mcg Oral QODAY   levothyroxine  75 mcg Oral QODAY   QUEtiapine  300 mg Oral QHS   Continuous Infusions:  cefTRIAXone (ROCEPHIN)  IV 2 g (04/29/20 2117)     LOS: 1 day   Time spent: 35 minutes.  Arnetha CourserSumayya Cloyce Paterson, MD [facility]  If 7PM-7AM, please contact night-coverage [email or web address]  04/30/2020, 4:21 PM   This record has been created using Conservation officer, historic buildingsDragon voice recognition software. Errors have been sought and corrected,but may not always be located. Such creation errors do not reflect on the standard of care.

## 2020-05-01 LAB — BASIC METABOLIC PANEL
Anion gap: 11 (ref 5–15)
BUN: 14 mg/dL (ref 8–23)
CO2: 25 mmol/L (ref 22–32)
Calcium: 8.2 mg/dL — ABNORMAL LOW (ref 8.9–10.3)
Chloride: 100 mmol/L (ref 98–111)
Creatinine, Ser: 1.35 mg/dL — ABNORMAL HIGH (ref 0.44–1.00)
GFR, Estimated: 43 mL/min — ABNORMAL LOW (ref >60)
Glucose, Bld: 99 mg/dL (ref 70–99)
Potassium: 4 mmol/L (ref 3.5–5.1)
Sodium: 136 mmol/L (ref 135–145)

## 2020-05-01 LAB — URINE CULTURE: Culture: >=100000 — AB

## 2020-05-01 LAB — TROPONIN I (HIGH SENSITIVITY)
Troponin I (High Sensitivity): 8 ng/L (ref <18)
Troponin I (High Sensitivity): 8 ng/L (ref <18)

## 2020-05-01 LAB — GLUCOSE, CAPILLARY
Glucose-Capillary: 85 mg/dL (ref 70–99)
Glucose-Capillary: 91 mg/dL (ref 70–99)

## 2020-05-01 LAB — MAGNESIUM: Magnesium: 2.6 mg/dL — ABNORMAL HIGH (ref 1.7–2.4)

## 2020-05-01 MED ORDER — PROCHLORPERAZINE EDISYLATE 10 MG/2ML IJ SOLN
10.0000 mg | Freq: Once | INTRAMUSCULAR | Status: AC
  Administered 2020-05-01: 12:00:00 10 mg via INTRAVENOUS
  Filled 2020-05-01: qty 2

## 2020-05-01 MED ORDER — DIPHENHYDRAMINE HCL 50 MG/ML IJ SOLN
25.0000 mg | Freq: Once | INTRAMUSCULAR | Status: AC
  Administered 2020-05-01: 12:00:00 25 mg via INTRAVENOUS
  Filled 2020-05-01: qty 1

## 2020-05-01 MED ORDER — KETOROLAC TROMETHAMINE 30 MG/ML IJ SOLN
30.0000 mg | Freq: Once | INTRAMUSCULAR | Status: AC
  Administered 2020-05-01: 12:00:00 30 mg via INTRAVENOUS
  Filled 2020-05-01: qty 1

## 2020-05-01 MED ORDER — CEFDINIR 300 MG PO CAPS: 300.0000 mg | ORAL_CAPSULE | Freq: Two times a day (BID) | ORAL | 0 refills | Status: AC

## 2020-05-01 NOTE — Discharge Summary (Signed)
Physician Discharge Summary  Jennifer Davidson VWU:[id number] DOB: March 24, 1956 DOA: 04/29/2020  PCP: Patient, No Pcp Per  Admit date: 04/29/2020 Discharge date: 05/01/2020  Admitted From: Home Disposition: Home  Recommendations for Outpatient Follow-up:  1. Follow up with PCP in 1-2 weeks 2. Please obtain BMP/CBC in one week 3. Please follow up on the following pending results: None  Home Health: No Equipment/Devices: None Discharge Condition: Stable CODE STATUS: Full Diet recommendation: Heart Healthy / Carb Modified   Brief/Interim Summary: Ilyse G Kingis a 64 y.o.femalewith medical history significant ofCKD3b, Anxiety, HLD, Hypothyroidism. Presenting with abdominal pain that started 1 month ago. Initially it was in the LLQ, but has migrated over to the RLQ over the last several weeks. It's sharp in episodes lasting 5 - 10 minutes. She has tried heating pads to help, but they have not. She has had N/V over the last 2 weeks. Anti-emetics provide temporary relief. She was seen in the ED about 12 days ago. Imaging for negative. She was given fluids and appeared to improve. She notes that over this past weekend, she had intermittent fevers and chills. She felt very poor. She tried to get into her PCP, but they are no longer available. So, she came to the ED. Urine and 1/4 blood culture with E. Coli, good sensitivity.  Patient received ceftriaxone while in the hospital and discharged on cefdinir to complete a 10-day course. Patient met sepsis criteria secondary to E. coli bacteremia and UTI on admission.  Received appropriate IV fluid and antibiotics. Procalcitonin was elevated at 1.36.  Patient has an history of CKD stage IIIb and creatinine remained stable.  Patient had multiple electrolyte derangements which include hyponatremia, hypokalemia and hypomagnesemia which were repleted and normal before discharge.  Patient was complaining of atypical chest pain with some chest wall tenderness,  EKG was done and it was normal.  Troponin x2 remained negative.  She also experiencing migraine headaches and received migraine cocktail once as home medications were not helpful.  She will continue with rest of her home meds and follow-up with her primary care provider.  She will get benefit with consolidation of her multiple home meds which can be done at primary care office.  Discharge Diagnoses:  Active Problems:   Sepsis ([place])   Acute cystitis without hematuria  Discharge Instructions  Discharge Instructions    Diet - low sodium heart healthy   Complete by: As directed    Discharge instructions   Complete by: As directed    It was pleasure taking care of you.   You are being given antibiotics for 10 more days, please take it as directed. Follow-up with your primary care provider in 1 to 2 weeks.   Increase activity slowly   Complete by: As directed      Allergies as of 05/01/2020      Reactions   Tramadol Nausea And Vomiting   Trazodone And Nefazodone Other (See Comments)   Per pt trazodone caused hallucinations and behavior changes      Medication List    TAKE these medications   Aimovig 70 MG/ML Soaj Generic drug: Erenumab-aooe Inject 70 mg into the skin every 28 (twenty-eight) days.   albuterol 108 (90 Base) MCG/ACT inhaler Commonly known as: VENTOLIN HFA TAKE 2 PUFFS BY MOUTH EVERY 6 HOURS AS NEEDED FOR WHEEZE OR SHORTNESS OF BREATH What changed: See the new instructions.   Align 4 MG Caps Take 1 capsule (4 mg total) by mouth daily.   aspirin EC  81 MG tablet Take 1 tablet (81 mg total) by mouth daily.   atorvastatin 20 MG tablet Commonly known as: LIPITOR TAKE 1 TABLET BY MOUTH EVERY DAY   buPROPion 150 MG 24 hr tablet Commonly known as: WELLBUTRIN XL Take 1 tablet (150 mg total) by mouth daily.   busPIRone 7.5 MG tablet Commonly known as: BUSPAR Take 1 tablet (7.5 mg total) by mouth 3 (three) times daily.   cefdinir 300 MG capsule Commonly  known as: OMNICEF Take 1 capsule (300 mg total) by mouth 2 (two) times daily for 10 days.   diclofenac sodium 1 % Gel Commonly known as: VOLTAREN Apply 2 g topically daily as needed (for knee pain).   DULoxetine 60 MG capsule Commonly known as: CYMBALTA TAKE 1 CAPSULE EVERY DAY   esomeprazole 40 MG capsule Commonly known as: NEXIUM TAKE 1 CAPSULE EVERY DAY   famotidine 40 MG tablet Commonly known as: PEPCID Take 40 mg by mouth daily.   fluticasone 50 MCG/ACT nasal spray Commonly known as: FLONASE PLACE 2 SPRAYS AT BEDTIME INTO BOTH NOSTRILS. What changed:   how much to take  how to take this  when to take this  additional instructions   gabapentin 400 MG capsule Commonly known as: Neurontin Take 1 capsule (400 mg total) by mouth at bedtime. What changed:   how much to take  when to take this   hydrOXYzine 50 MG tablet Commonly known as: ATARAX/VISTARIL TAKE 1 TABLET EVERY 6 HOURS AS NEEDED What changed: reasons to take this   icosapent Ethyl 1 g capsule Commonly known as: Vascepa Take 2 capsules (2 g total) by mouth 2 (two) times daily.   levothyroxine 75 MCG tablet Commonly known as: SYNTHROID Take 75 mcg by mouth every other day. Before breakfast What changed: Another medication with the same name was changed. Make sure you understand how and when to take each.   levothyroxine 50 MCG tablet Commonly known as: SYNTHROID TAKE 1 TABLET BY MOUTH EVERY OTHER DAY. BEFORE BREAKFAST, ALTERNATED WITH 75MCG TABLET. What changed: See the new instructions.   metFORMIN 500 MG tablet Commonly known as: GLUCOPHAGE Take 1 tablet (500 mg total) by mouth daily with breakfast.   ondansetron 4 MG disintegrating tablet Commonly known as: Zofran ODT Take 1 tablet (4 mg total) by mouth every 8 (eight) hours as needed for nausea or vomiting.   prazosin 5 MG capsule Commonly known as: MINIPRESS TAKE 2 TO 3 CAPSULES AT BEDTIME What changed:   See the new  instructions.  Another medication with the same name was removed. Continue taking this medication, and follow the directions you see here.   QUEtiapine 300 MG tablet Commonly known as: SEROQUEL TAKE 1 TABLET BY MOUTH EVERYDAY AT BEDTIME What changed:   how much to take  how to take this  when to take this  additional instructions  Another medication with the same name was removed. Continue taking this medication, and follow the directions you see here.   rizatriptan 10 MG disintegrating tablet Commonly known as: MAXALT-MLT Take 1 tablet (10 mg total) by mouth as needed for migraine. May repeat in 2 hours if needed       Follow-up Information    Turner, Eber Hong, MD. Schedule an appointment as soon as possible for a visit in 1 week(s).   Specialty: Cardiology Contact information: [address] [place] [address] [phone]              Allergies  Allergen Reactions  . Tramadol Nausea And Vomiting  . Trazodone And Nefazodone Other (See Comments)    Per pt trazodone caused hallucinations and behavior changes     Consultations:  None  Procedures/Studies: CT ABDOMEN PELVIS W CONTRAST  Result Date: 04/17/2020 CLINICAL DATA:  Acute left-sided abdominal pain. EXAM: CT ABDOMEN AND PELVIS WITH CONTRAST TECHNIQUE: Multidetector CT imaging of the abdomen and pelvis was performed using the standard protocol following bolus administration of intravenous contrast. CONTRAST:  62m OMNIPAQUE IOHEXOL 300 MG/ML  SOLN COMPARISON:  Sep 25, 2018. FINDINGS: Lower chest: No acute abnormality. Hepatobiliary: No focal liver abnormality is seen. No gallstones, gallbladder wall thickening, or biliary dilatation. Pancreas: Unremarkable. No pancreatic ductal dilatation or surrounding inflammatory changes. Spleen: Normal in size without focal abnormality. Adrenals/Urinary Tract: Adrenal glands are unremarkable. Kidneys are normal, without renal calculi, focal lesion, or  hydronephrosis. Bladder is unremarkable. Stomach/Bowel: The stomach appears normal. There is no evidence of bowel obstruction or inflammation. Status post appendectomy. Vascular/Lymphatic: No significant vascular findings are present. No enlarged abdominal or pelvic lymph nodes. Reproductive: Status post hysterectomy. No adnexal masses. Other: No abdominal wall hernia or abnormality. No abdominopelvic ascites. Musculoskeletal: No acute or significant osseous findings. IMPRESSION: No acute abnormality seen in the abdomen or pelvis. Electronically Signed   By: JMarijo ConceptionM.D.   On: 04/17/2020 11:10   DG Chest Port 1 View  Result Date: 04/29/2020 CLINICAL DATA:  Sepsis, fever, UTI EXAM: PORTABLE CHEST 1 VIEW COMPARISON:  09/26/2018 FINDINGS: Heart and mediastinal contours are within normal limits. No focal opacities or effusions. No acute bony abnormality. IMPRESSION: No active disease. Electronically Signed   By: KRolm BaptiseM.D.   On: 04/29/2020 01:23     Subjective: Patient was complaining of migraine headaches and nausea, she did received her home dose of Imitrex with no help.  Also complaining of substernal chest pain which is sharp, nonradiating and increases with touching and deep breathing.  Discharge Exam: Vitals:   05/01/20 0447 05/01/20 1336  BP: 107/79 (!) 153/92  Pulse: 88 69  Resp: 15 14  Temp: 97.6 F (36.4 C) 98.4 F (36.9 C)  SpO2: 97% 91%   Vitals:   04/30/20 1217 04/30/20 2015 05/01/20 0447 05/01/20 1336  BP: 126/70 129/70 107/79 (!) 153/92  Pulse: 71 86 88 69  Resp: _0 Temp: 98 F (36.7 C) 98.1 F (36.7 C) 97.6 F (36.4 C) 98.4 F (36.9 C)  TempSrc: Oral Oral Oral Oral  SpO2: 93% 96% 97% 91%  Weight:      Height:        General: Pt is alert, awake, not in acute distress Cardiovascular: RRR, S1/S2 +, no rubs, no gallops Respiratory: CTA bilaterally, no wheezing, no rhonchi Abdominal: Soft, NT, ND, bowel sounds + Extremities: no edema, no  cyanosis   The results of significant diagnostics from this hospitalization (including imaging, microbiology, ancillary and laboratory) are listed below for reference.    Microbiology: Recent Results (from the past 240 hour(s))  Resp Panel by RT-PCR (Flu A&B, Covid) Nasopharyngeal Swab     Status: None   Collection Time: 04/28/20 10:09 PM   Specimen: Nasopharyngeal Swab; Nasopharyngeal(NP) swabs in vial transport medium  Result Value Ref Range Status   SARS Coronavirus 2 by RT PCR NEGATIVE NEGATIVE Final    Comment: (NOTE) SARS-CoV-2 target nucleic acids are NOT DETECTED.  The SARS-CoV-2 RNA is generally detectable in upper respiratory specimens during the acute phase of infection. The lowest  concentration of SARS-CoV-2 viral copies this assay can detect is 138 copies/mL. A negative result does not preclude SARS-Cov-2 infection and should not be used as the sole basis for treatment or other patient management decisions. A negative result may occur with  improper specimen collection/handling, submission of specimen other than nasopharyngeal swab, presence of viral mutation(s) within the areas targeted by this assay, and inadequate number of viral copies(<138 copies/mL). A negative result must be combined with clinical observations, patient history, and epidemiological information. The expected result is Negative.  Fact Sheet for Patients:  EntrepreneurPulse.com.au  Fact Sheet for Healthcare Providers:  IncredibleEmployment.be  This test is no t yet approved or cleared by the [place] FDA and  has been authorized for detection and/or diagnosis of SARS-CoV-2 by FDA under an Emergency Use Authorization (EUA). This EUA will remain  in effect (meaning this test can be used) for the duration of the COVID-19 declaration under Section 564(b)(1) of the Act, 21 U.S.C.section 360bbb-3(b)(1), unless the authorization is terminated  or revoked sooner.        Influenza A by PCR NEGATIVE NEGATIVE Final   Influenza B by PCR NEGATIVE NEGATIVE Final    Comment: (NOTE) The Xpert Xpress SARS-CoV-2/FLU/RSV plus assay is intended as an aid in the diagnosis of influenza from Nasopharyngeal swab specimens and should not be used as a sole basis for treatment. Nasal washings and aspirates are unacceptable for Xpert Xpress SARS-CoV-2/FLU/RSV testing.  Fact Sheet for Patients: EntrepreneurPulse.com.au  Fact Sheet for Healthcare Providers: IncredibleEmployment.be  This test is not yet approved or cleared by the [place] FDA and has been authorized for detection and/or diagnosis of SARS-CoV-2 by FDA under an Emergency Use Authorization (EUA). This EUA will remain in effect (meaning this test can be used) for the duration of the COVID-19 declaration under Section 564(b)(1) of the Act, 21 U.S.C. section 360bbb-3(b)(1), unless the authorization is terminated or revoked.  Performed at [facility], [place] [address], [place], [place] [address]   Blood culture (routine x 2)     Status: None (Preliminary result)   Collection Time: 04/29/20  1:42 AM   Specimen: BLOOD  Result Value Ref Range Status   Specimen Description   Final    BLOOD LEFT ANTECUBITAL Performed at [facility] [address] [place], [place] [address]    Special Requests   Final    BOTTLES DRAWN AEROBIC AND ANAEROBIC Blood Culture adequate volume Performed at [facility] [address], [place], [place] [address]    Culture   Final    NO GROWTH 1 DAY Performed at [facility], [place] [address], [place], [place] [address]    Report Status PENDING  Incomplete  Blood culture (routine x 2)     Status: Abnormal   Collection Time: 04/29/20  1:42 AM   Specimen: BLOOD  Result Value Ref Range Status   Specimen Description   Final    BLOOD RIGHT ANTECUBITAL Performed at [facility] [address], [place], [place] [address]    Special Requests   Final    BOTTLES DRAWN AEROBIC AND ANAEROBIC Blood Culture adequate volume Performed at [facility] [address], [place],  [address]    Culture  Setup Time   Final    ANAEROBIC BOTTLE ONLY Organism ID to follow GRAM NEGATIVE RODS CRITICAL RESULT CALLED TO, READ BACK BY AND VERIFIED WITH: PHARMD J GADHIA 04/29/20 AT [place]  Performed at [facility], [place] [address], [place], [place] [address]    Culture ESCHERICHIA COLI (A)  Final   Report Status 05/01/2020 FINAL  Final   Organism ID, Bacteria ESCHERICHIA COLI  Final      Susceptibility   Escherichia coli - MIC*    AMPICILLIN >=32 RESISTANT Resistant     CEFAZOLIN <=4 SENSITIVE Sensitive     CEFEPIME <=0.12 SENSITIVE Sensitive     CEFTAZIDIME <=1 SENSITIVE Sensitive     CEFTRIAXONE <=0.25 SENSITIVE Sensitive     CIPROFLOXACIN 0.5 SENSITIVE Sensitive     GENTAMICIN <=1 SENSITIVE Sensitive     IMIPENEM <=0.25 SENSITIVE Sensitive     TRIMETH/SULFA >=320 RESISTANT Resistant     AMPICILLIN/SULBACTAM 8 SENSITIVE Sensitive     PIP/TAZO <=4 SENSITIVE Sensitive     * ESCHERICHIA COLI  Blood Culture ID Panel (Reflexed)     Status: Abnormal   Collection Time: 04/29/20  1:42 AM  Result Value Ref Range Status   Enterococcus faecalis NOT DETECTED NOT DETECTED Final   Enterococcus Faecium NOT DETECTED NOT DETECTED Final   Listeria monocytogenes NOT DETECTED NOT DETECTED Final   Staphylococcus species NOT DETECTED NOT DETECTED Final   Staphylococcus aureus (BCID) NOT DETECTED NOT DETECTED Final   Staphylococcus epidermidis NOT DETECTED NOT DETECTED Final   Staphylococcus lugdunensis NOT DETECTED NOT DETECTED Final   Streptococcus species NOT DETECTED NOT DETECTED Final   Streptococcus agalactiae NOT DETECTED NOT DETECTED Final   Streptococcus pneumoniae NOT DETECTED NOT DETECTED Final   Streptococcus pyogenes NOT  DETECTED NOT DETECTED Final   A.calcoaceticus-baumannii NOT DETECTED NOT DETECTED Final   Bacteroides fragilis NOT DETECTED NOT DETECTED Final   Enterobacterales DETECTED (A) NOT DETECTED Final    Comment: Enterobacterales represent a large order of gram negative bacteria, not a single organism. CRITICAL RESULT CALLED TO, READ BACK BY AND VERIFIED WITH: PHARMD J GADHIA 04/29/20 AT 1948 SK    Enterobacter cloacae complex NOT DETECTED NOT DETECTED Final   Escherichia coli DETECTED (A) NOT DETECTED Final    Comment: CRITICAL RESULT CALLED TO, READ BACK BY AND VERIFIED WITH: PHARMD J GADHIA 04/29/20 AT 1948 SK    Klebsiella aerogenes NOT DETECTED NOT DETECTED Final   Klebsiella oxytoca NOT DETECTED NOT DETECTED Final   Klebsiella pneumoniae NOT DETECTED NOT DETECTED Final   Proteus species NOT DETECTED NOT DETECTED Final   Salmonella species NOT DETECTED NOT DETECTED Final   Serratia marcescens NOT DETECTED NOT DETECTED Final   Haemophilus influenzae NOT DETECTED NOT DETECTED Final   Neisseria meningitidis NOT DETECTED NOT DETECTED Final   Pseudomonas aeruginosa NOT DETECTED NOT DETECTED Final   Stenotrophomonas maltophilia NOT DETECTED NOT DETECTED Final   Candida albicans NOT DETECTED NOT DETECTED Final   Candida auris NOT DETECTED NOT DETECTED Final   Candida glabrata NOT DETECTED NOT DETECTED Final   Candida krusei NOT DETECTED NOT DETECTED Final   Candida parapsilosis NOT DETECTED NOT DETECTED Final   Candida tropicalis NOT DETECTED NOT DETECTED Final   Cryptococcus neoformans/gattii NOT DETECTED NOT DETECTED Final   CTX-M ESBL NOT DETECTED NOT DETECTED Final   Carbapenem resistance IMP NOT DETECTED NOT DETECTED Final   Carbapenem resistance KPC NOT DETECTED NOT DETECTED Final   Carbapenem resistance NDM NOT DETECTED NOT DETECTED Final   Carbapenem resist OXA 48 LIKE NOT DETECTED NOT DETECTED Final   Carbapenem resistance VIM NOT DETECTED NOT DETECTED Final    Comment: Performed  at [facility], [address], [place], [place] [address]  Urine culture     Status: Abnormal   Collection Time: 04/29/20  1:49 AM   Specimen: Urine, Random  Result Value Ref Range Status   Specimen Description   Final    URINE, RANDOM Performed at [facility] [address], [place], [place] [address]    Special Requests   Final    NONE Performed at [facility], [place] [address], [place], [place] [address]    Culture >=100,000 COLONIES/mL ESCHERICHIA COLI (A)  Final   Report Status 05/01/2020 FINAL  Final   Organism ID, Bacteria ESCHERICHIA COLI (A)  Final      Susceptibility   Escherichia coli - MIC*    AMPICILLIN >=32 RESISTANT Resistant     CEFAZOLIN <=4 SENSITIVE Sensitive     CEFEPIME <=0.12 SENSITIVE Sensitive     CEFTRIAXONE <=0.25 SENSITIVE Sensitive     CIPROFLOXACIN 0.5 SENSITIVE Sensitive     GENTAMICIN <=1 SENSITIVE Sensitive     IMIPENEM <=0.25 SENSITIVE Sensitive     NITROFURANTOIN <=16 SENSITIVE Sensitive     TRIMETH/SULFA >=320 RESISTANT Resistant     AMPICILLIN/SULBACTAM 8 SENSITIVE Sensitive     PIP/TAZO <=4 SENSITIVE Sensitive     * >=100,000 COLONIES/mL ESCHERICHIA COLI     Labs: BNP (last 3 results) No results for input(s): BNP in the last 8760 hours. Basic Metabolic Panel: Recent Labs  Lab 04/28/20 2216 04/29/20 1016 04/30/20 0456 05/01/20 0447 05/01/20 1057  NA 130*  --  136  --  136  K 3.8  --  3.4*  --  4.0  CL 95*  --  101  --  100  CO2 22  --  27  --  25  GLUCOSE 136*  --  109*  --  99  BUN 14  --  12  --  14  CREATININE 1.57* 1.36* 1.28*  --  1.35*  CALCIUM 8.7*  --  8.0*  --  8.2*  MG  --   --  1.5* 2.6*  --    Liver Function Tests: Recent Labs  Lab 04/28/20 2216 04/30/20 0456  AST 32 30  ALT 27 25  ALKPHOS 117 102  BILITOT 0.6 0.3  PROT 7.5 5.9*  ALBUMIN 3.6 2.7*   Recent Labs  Lab 04/28/20 2216  LIPASE 19   No results for input(s): AMMONIA in the last 168  hours. CBC: Recent Labs  Lab 04/28/20 2216 04/29/20 1016 04/30/20 0456  WBC 15.3* 15.7* 10.8*  HGB 12.5 10.8* 9.7*  HCT 38.5 32.5* 29.3*  MCV 79.9* 79.1* 79.4*  PLT 229 183 176   Cardiac Enzymes: No results for input(s): CKTOTAL, CKMB, CKMBINDEX, TROPONINI in the last 168 hours. BNP: Invalid input(s): POCBNP CBG: Recent Labs  Lab 04/30/20 1140 04/30/20 1639 04/30/20 2017 05/01/20 0729 05/01/20 1117  GLUCAP 92 88 137* 85 91   D-Dimer No results for input(s): DDIMER in the last 72 hours. Hgb A1c Recent Labs    04/29/20 1016  HGBA1C 5.7*   Lipid Profile No results for input(s): CHOL, HDL, LDLCALC, TRIG, CHOLHDL, LDLDIRECT in the last 72 hours. Thyroid function studies No results for input(s): TSH, T4TOTAL, T3FREE, THYROIDAB in the last 72 hours.  Invalid input(s): FREET3 Anemia work up No results for input(s): VITAMINB12, FOLATE, FERRITIN, TIBC, IRON, RETICCTPCT in the last 72 hours. Urinalysis    Component Value Date/Time   COLORURINE YELLOW 04/28/2020 2208   APPEARANCEUR CLOUDY (A) 04/28/2020 2208   LABSPEC  1.010 04/28/2020 2208   PHURINE 6.0 04/28/2020 2208   GLUCOSEU NEGATIVE 04/28/2020 2208   HGBUR SMALL (A) 04/28/2020 2208   BILIRUBINUR NEGATIVE 04/28/2020 2208   BILIRUBINUR negative 03/29/2018 0927   BILIRUBINUR neg 12/15/2014 [place] 04/28/2020 2208   PROTEINUR 30 (A) 04/28/2020 2208   UROBILINOGEN 0.2 03/29/2018 0927   UROBILINOGEN 1.0 12/03/2014 0648   NITRITE NEGATIVE 04/28/2020 2208   LEUKOCYTESUR LARGE (A) 04/28/2020 2208   Sepsis Labs Invalid input(s): PROCALCITONIN,  WBC,  LACTICIDVEN Microbiology Recent Results (from the past 240 hour(s))  Resp Panel by RT-PCR (Flu A&B, Covid) Nasopharyngeal Swab     Status: None   Collection Time: 04/28/20 10:09 PM   Specimen: Nasopharyngeal Swab; Nasopharyngeal(NP) swabs in vial transport medium  Result Value Ref Range Status   SARS Coronavirus 2 by RT PCR NEGATIVE NEGATIVE Final     Comment: (NOTE) SARS-CoV-2 target nucleic acids are NOT DETECTED.  The SARS-CoV-2 RNA is generally detectable in upper respiratory specimens during the acute phase of infection. The lowest concentration of SARS-CoV-2 viral copies this assay can detect is 138 copies/mL. A negative result does not preclude SARS-Cov-2 infection and should not be used as the sole basis for treatment or other patient management decisions. A negative result may occur with  improper specimen collection/handling, submission of specimen other than nasopharyngeal swab, presence of viral mutation(s) within the areas targeted by this assay, and inadequate number of viral copies(<138 copies/mL). A negative result must be combined with clinical observations, patient history, and epidemiological information. The expected result is Negative.  Fact Sheet for Patients:  EntrepreneurPulse.com.au  Fact Sheet for Healthcare Providers:  IncredibleEmployment.be  This test is no t yet approved or cleared by the [place] FDA and  has been authorized for detection and/or diagnosis of SARS-CoV-2 by FDA under an Emergency Use Authorization (EUA). This EUA will remain  in effect (meaning this test can be used) for the duration of the COVID-19 declaration under Section 564(b)(1) of the Act, 21 U.S.C.section 360bbb-3(b)(1), unless the authorization is terminated  or revoked sooner.       Influenza A by PCR NEGATIVE NEGATIVE Final   Influenza B by PCR NEGATIVE NEGATIVE Final    Comment: (NOTE) The Xpert Xpress SARS-CoV-2/FLU/RSV plus assay is intended as an aid in the diagnosis of influenza from Nasopharyngeal swab specimens and should not be used as a sole basis for treatment. Nasal washings and aspirates are unacceptable for Xpert Xpress SARS-CoV-2/FLU/RSV testing.  Fact Sheet for Patients: EntrepreneurPulse.com.au  Fact Sheet for Healthcare  Providers: IncredibleEmployment.be  This test is not yet approved or cleared by the [place] FDA and has been authorized for detection and/or diagnosis of SARS-CoV-2 by FDA under an Emergency Use Authorization (EUA). This EUA will remain in effect (meaning this test can be used) for the duration of the COVID-19 declaration under Section 564(b)(1) of the Act, 21 U.S.C. section 360bbb-3(b)(1), unless the authorization is terminated or revoked.  Performed at [facility], [place] [address] [place], [place] [address]   Blood culture (routine x 2)     Status: None (Preliminary result)   Collection Time: 04/29/20  1:42 AM   Specimen: BLOOD  Result Value Ref Range Status   Specimen Description   Final    BLOOD LEFT ANTECUBITAL Performed at [facility] [address] [place], [place] [address]    Special Requests   Final    BOTTLES DRAWN AEROBIC AND ANAEROBIC Blood Culture adequate  volume Performed at [facility], [place] [address] [place], [place] [address]    Culture   Final    NO GROWTH 1 DAY Performed at [facility], [place] [address] [place], [place] [address]    Report Status PENDING  Incomplete  Blood culture (routine x 2)     Status: Abnormal   Collection Time: 04/29/20  1:42 AM   Specimen: BLOOD  Result Value Ref Range Status   Specimen Description   Final    BLOOD RIGHT ANTECUBITAL Performed at [facility] [address] [place], [place] [address]    Special Requests   Final    BOTTLES DRAWN AEROBIC AND ANAEROBIC Blood Culture adequate volume Performed at [employer] [facility] [address] [place], [place] [address]    Culture  Setup Time   Final    ANAEROBIC BOTTLE ONLY Organism ID to follow GRAM NEGATIVE RODS CRITICAL RESULT CALLED TO, READ BACK BY AND VERIFIED WITH: PHARMD J GADHIA 04/29/20 AT 1948 SK  Performed at [facility], [place] [address] [place], [place] [address]    Culture ESCHERICHIA COLI (A)  Final   Report Status 05/01/2020 FINAL  Final   Organism ID, Bacteria ESCHERICHIA COLI  Final      Susceptibility   Escherichia coli - MIC*    AMPICILLIN >=32 RESISTANT Resistant     CEFAZOLIN <=4 SENSITIVE Sensitive     CEFEPIME <=0.12 SENSITIVE Sensitive     CEFTAZIDIME <=1 SENSITIVE Sensitive     CEFTRIAXONE <=0.25 SENSITIVE Sensitive     CIPROFLOXACIN 0.5 SENSITIVE Sensitive     GENTAMICIN <=1 SENSITIVE Sensitive     IMIPENEM <=0.25 SENSITIVE Sensitive     TRIMETH/SULFA >=320 RESISTANT Resistant     AMPICILLIN/SULBACTAM 8 SENSITIVE Sensitive     PIP/TAZO <=4 SENSITIVE Sensitive     * ESCHERICHIA COLI  Blood Culture ID Panel (Reflexed)     Status: Abnormal   Collection Time: 04/29/20  1:42 AM  Result Value Ref Range Status   Enterococcus faecalis NOT DETECTED NOT DETECTED Final   Enterococcus Faecium NOT DETECTED NOT DETECTED Final   Listeria monocytogenes NOT DETECTED NOT DETECTED Final   Staphylococcus species NOT DETECTED NOT DETECTED Final   Staphylococcus aureus (BCID) NOT DETECTED NOT DETECTED Final   Staphylococcus epidermidis NOT DETECTED NOT DETECTED Final   Staphylococcus lugdunensis NOT DETECTED NOT DETECTED Final   Streptococcus species NOT DETECTED NOT DETECTED Final   Streptococcus agalactiae NOT DETECTED NOT DETECTED Final   Streptococcus pneumoniae NOT DETECTED NOT DETECTED Final   Streptococcus pyogenes NOT DETECTED NOT DETECTED Final   A.calcoaceticus-baumannii NOT DETECTED NOT DETECTED Final   Bacteroides fragilis NOT DETECTED NOT DETECTED Final   Enterobacterales DETECTED (A) NOT DETECTED Final    Comment: Enterobacterales represent a large order of gram negative bacteria, not a single organism. CRITICAL RESULT CALLED TO, READ BACK BY AND VERIFIED WITH: PHARMD J GADHIA 04/29/20 AT 1948 SK    Enterobacter cloacae complex NOT DETECTED NOT DETECTED Final   Escherichia coli DETECTED (A) NOT  DETECTED Final    Comment: CRITICAL RESULT CALLED TO, READ BACK BY AND VERIFIED WITH: PHARMD J GADHIA 04/29/20 AT 1948 SK    Klebsiella aerogenes NOT DETECTED NOT DETECTED Final   Klebsiella oxytoca NOT DETECTED NOT DETECTED Final   Klebsiella pneumoniae NOT DETECTED NOT DETECTED Final   Proteus species NOT DETECTED NOT DETECTED Final   Salmonella species NOT DETECTED NOT DETECTED Final  Serratia marcescens NOT DETECTED NOT DETECTED Final   Haemophilus influenzae NOT DETECTED NOT DETECTED Final   Neisseria meningitidis NOT DETECTED NOT DETECTED Final   Pseudomonas aeruginosa NOT DETECTED NOT DETECTED Final   Stenotrophomonas maltophilia NOT DETECTED NOT DETECTED Final   Candida albicans NOT DETECTED NOT DETECTED Final   Candida auris NOT DETECTED NOT DETECTED Final   Candida glabrata NOT DETECTED NOT DETECTED Final   Candida krusei NOT DETECTED NOT DETECTED Final   Candida parapsilosis NOT DETECTED NOT DETECTED Final   Candida tropicalis NOT DETECTED NOT DETECTED Final   Cryptococcus neoformans/gattii NOT DETECTED NOT DETECTED Final   CTX-M ESBL NOT DETECTED NOT DETECTED Final   Carbapenem resistance IMP NOT DETECTED NOT DETECTED Final   Carbapenem resistance KPC NOT DETECTED NOT DETECTED Final   Carbapenem resistance NDM NOT DETECTED NOT DETECTED Final   Carbapenem resist OXA 48 LIKE NOT DETECTED NOT DETECTED Final   Carbapenem resistance VIM NOT DETECTED NOT DETECTED Final    Comment: Performed at [facility], [address] [place], [place] [address]  Urine culture     Status: Abnormal   Collection Time: 04/29/20  1:49 AM   Specimen: Urine, Random  Result Value Ref Range Status   Specimen Description   Final    URINE, RANDOM Performed at [facility] [address], [place],  [address]    Special Requests   Final    NONE Performed at [facility], [place] [address] [place], [place] [address]    Culture >=100,000  COLONIES/mL ESCHERICHIA COLI (A)  Final   Report Status 05/01/2020 FINAL  Final   Organism ID, Bacteria ESCHERICHIA COLI (A)  Final      Susceptibility   Escherichia coli - MIC*    AMPICILLIN >=32 RESISTANT Resistant     CEFAZOLIN <=4 SENSITIVE Sensitive     CEFEPIME <=0.12 SENSITIVE Sensitive     CEFTRIAXONE <=0.25 SENSITIVE Sensitive     CIPROFLOXACIN 0.5 SENSITIVE Sensitive     GENTAMICIN <=1 SENSITIVE Sensitive     IMIPENEM <=0.25 SENSITIVE Sensitive     NITROFURANTOIN <=16 SENSITIVE Sensitive     TRIMETH/SULFA >=320 RESISTANT Resistant     AMPICILLIN/SULBACTAM 8 SENSITIVE Sensitive     PIP/TAZO <=4 SENSITIVE Sensitive     * >=100,000 COLONIES/mL ESCHERICHIA COLI    Time coordinating discharge: Over 30 minutes  SIGNED:  Lorella Nimrod, MD  [facility] 05/01/2020, 2:32 PM  If 7PM-7AM, please contact night-coverage [email or web address]  This record has been created using Systems analyst. Errors have been sought and corrected,but may not always be located. Such creation errors do not reflect on the standard of care.

## 2020-05-02 LAB — CULTURE, BLOOD (ROUTINE X 2): Special Requests: ADEQUATE

## 2020-05-04 LAB — CULTURE, BLOOD (ROUTINE X 2)
Culture: NO GROWTH
Special Requests: ADEQUATE

## 2020-05-05 ENCOUNTER — Telehealth: Payer: Self-pay | Admitting: Cardiology

## 2020-05-05 NOTE — Telephone Encounter (Signed)
Spoke with the patient's husband and have scheduled her for a visit with Dr. Mayford Knife on 01/04

## 2020-05-05 NOTE — Telephone Encounter (Signed)
[name] is calling to schedule Jennifer Davidson a hospital f/u, but I am unable to find anything available before 05/28/19. He is requesting she be worked in for sooner if possible. Please advise.

## 2020-05-06 ENCOUNTER — Encounter: Payer: Self-pay | Admitting: Family Medicine

## 2020-05-06 ENCOUNTER — Ambulatory Visit (INDEPENDENT_AMBULATORY_CARE_PROVIDER_SITE_OTHER): Payer: Medicare Other | Admitting: Family Medicine

## 2020-05-06 ENCOUNTER — Other Ambulatory Visit: Payer: Self-pay

## 2020-05-06 VITALS — BP 129/75 | HR 108 | Temp 98.0°F | Ht 63.0 in | Wt 190.0 lb

## 2020-05-06 DIAGNOSIS — G43109 Migraine with aura, not intractable, without status migrainosus: Secondary | ICD-10-CM | POA: Diagnosis not present

## 2020-05-06 DIAGNOSIS — R062 Wheezing: Secondary | ICD-10-CM

## 2020-05-06 DIAGNOSIS — T7840XA Allergy, unspecified, initial encounter: Secondary | ICD-10-CM | POA: Insufficient documentation

## 2020-05-06 DIAGNOSIS — J309 Allergic rhinitis, unspecified: Secondary | ICD-10-CM

## 2020-05-06 DIAGNOSIS — Z23 Encounter for immunization: Secondary | ICD-10-CM | POA: Diagnosis not present

## 2020-05-06 DIAGNOSIS — F99 Mental disorder, not otherwise specified: Secondary | ICD-10-CM

## 2020-05-06 DIAGNOSIS — Z09 Encounter for follow-up examination after completed treatment for conditions other than malignant neoplasm: Secondary | ICD-10-CM

## 2020-05-06 DIAGNOSIS — F515 Nightmare disorder: Secondary | ICD-10-CM | POA: Diagnosis not present

## 2020-05-06 DIAGNOSIS — E038 Other specified hypothyroidism: Secondary | ICD-10-CM | POA: Diagnosis not present

## 2020-05-06 DIAGNOSIS — F5105 Insomnia due to other mental disorder: Secondary | ICD-10-CM | POA: Diagnosis not present

## 2020-05-06 DIAGNOSIS — R7303 Prediabetes: Secondary | ICD-10-CM | POA: Diagnosis not present

## 2020-05-06 DIAGNOSIS — F331 Major depressive disorder, recurrent, moderate: Secondary | ICD-10-CM | POA: Diagnosis not present

## 2020-05-06 DIAGNOSIS — T7840XD Allergy, unspecified, subsequent encounter: Secondary | ICD-10-CM

## 2020-05-06 MED ORDER — ALBUTEROL SULFATE HFA 108 (90 BASE) MCG/ACT IN AERS: INHALATION_SPRAY | RESPIRATORY_TRACT | 1 refills | Status: DC

## 2020-05-06 MED ORDER — CETIRIZINE HCL 10 MG PO TABS: 10.0000 mg | ORAL_TABLET | Freq: Every day | ORAL | 3 refills | Status: DC

## 2020-05-06 MED ORDER — FLUTICASONE PROPIONATE 50 MCG/ACT NA SUSP: NASAL | 5 refills | Status: DC

## 2020-05-06 MED ORDER — LEVOTHYROXINE SODIUM 75 MCG PO TABS: 75.0000 ug | ORAL_TABLET | ORAL | 3 refills | Status: DC

## 2020-05-06 MED ORDER — PRAZOSIN HCL 5 MG PO CAPS: 10.0000 mg | ORAL_CAPSULE | Freq: Every day | ORAL | 1 refills | Status: DC

## 2020-05-06 NOTE — Progress Notes (Signed)
12/21/20215:20 PM  Jennifer Davidson 09-11-1955, 64 y.o., female [id number]  Chief Complaint  Patient presents with  . Hospitalization Follow-up    Lightheadedness with quick movement - discuss fluid intake from hospital encounter fluid around the heart, excessive thirst . Currently taking cefdinir for uti     HPI:  Patient is a 64 y.o. female with past medical history significant for hypothyroidism, anxiety, depression, HLD, prediabetes, OSA on cpap for hospital follow-up.   Patient Care Team: Avi Archuleta, Laurita Quint, FNP as PCP - General (Family Medicine) Sueanne Margarita, MD as PCP - Cardiology (Cardiology) [name], MD as Consulting Physician (Orthopedic Surgery) Rutherford Guys, MD as Consulting Physician (Ophthalmology) Normajean Glasgow, MD as Attending Physician (Physical Medicine and Rehabilitation) Dr. Tomi Likens Neuro: headaches Nephrology: unknown  12/2: Dehydration (gave 1 liter fluids) sent home  12/14-16:  . Presenting with abdominal pain that started 1 month ago. Initially it was in the LLQ, but has migrated over to the RLQ over the last several weeks. It's sharp in episodes lasting 5 - 10 minutes. . She has had N/V over the last 2 weeks.   intermittent fevers and chills. She felt very poor.  Urine and 1/4 blood culture with E. Coli, good sensitivity. Patient received ceftriaxone while in the hospital and discharged on cefdinir to complete a 10-day course.  Patient met sepsis criteria secondary to E. coli bacteremia and UTI on admission. Received appropriate IV fluid and antibiotics. Procalcitonin was elevated at 1.36.  Patient was complaining of atypical chest pain with some chest wall tenderness, EKG was done and it was normal. Troponin x2 remained negative.  She also experiencing migraine headaches and received migraine cocktail once as home medications were not helpful.   Post Discharge  Still feeling dizzy frequently, but this is an ongoing problem for months Will remain on Cefdinir  until Monday (12/27) Started on Metformin last OV in Oct No history of smoking  Has been using the albuterol frequently for the past 3 months Referal to pulm if cetrizine and flonase do not help   Decreasing Gabapentin to 400 mg daily for renal dosing  HLD (followed by Cards) Aspirin 94m daily Atorvastatin 249mdaily Vascepa 2 g daily  Hypothyroid Levothyroxine alt 5049m75mcg Lab Results  Component Value Date   TSH 1.530 02/18/2020   Pre-Diabetes Metformin 500m82mily Lab Results  Component Value Date   HGBA1C 5.7 (H) 04/29/2020   GI Zofran (nausea) Probiotic daily Famotidine/Nexium 40mg80mly? (GERD)  Asthma/Allergies Albuterol PRN Flonase daily Cetrizine at night  Pain Gabapentin 400 mg daily Cymbalta 60 mg daily Voltaren gel prn  Anxiety/Depression (psych referral placed) Wellbutrin 150 mg daily Buspar 7.5 mg daily Hydroxyzine 50 mg prn Prazosin 10-15 mg qhs (nightmares) Seroquel 300 mg qhs  Migraines (managed by Neuro) Aimovig monthly injections Maxalt 10mg 50m  Depression screen PHQ 2/[facility]2/21/2021 11/16/2019 07/19/2019  Decreased Interest 0 1 0  Down, Depressed, Hopeless 0 1 0  PHQ - 2 Score 0 2 0  Altered sleeping - 3 -  Tired, decreased energy - 1 -  Change in appetite - 1 -  Feeling bad or failure about yourself  - 0 -  Trouble concentrating - 0 -  Moving slowly or fidgety/restless - 1 -  Suicidal thoughts - 0 -  PHQ-9 Score - 8 -  Difficult doing work/chores - Not difficult at all -  Some recent data might be hidden    Fall Risk  05/06/2020 03/27/2020 11/16/2019 10/05/2019 07/19/2019  Falls  in the past year? 0 1 0 0 0  Comment - - - - -  Number falls in past yr: 0 1 0 0 0  Injury with Fall? 0 0 0 0 0  Comment - - - - -  Risk Factor Category  - - - - -  Risk for fall due to : - - - - -  Risk for fall due to: Comment - - - - -  Follow up Falls evaluation completed - Falls evaluation completed - -     Allergies  Allergen Reactions  .  Tramadol Nausea And Vomiting  . Trazodone And Nefazodone Other (See Comments)    Per pt trazodone caused hallucinations and behavior changes     Prior to Admission medications   Medication Sig Start Date End Date Taking? Authorizing Provider  aspirin EC 81 MG tablet Take 1 tablet (81 mg total) by mouth daily. 07/04/19  Yes Turner, Eber Hong, MD  atorvastatin (LIPITOR) 20 MG tablet TAKE 1 TABLET BY MOUTH EVERY DAY Patient taking differently: Take 20 mg by mouth daily. 01/29/20  Yes Jacelyn Pi, Lilia Argue, MD  buPROPion (WELLBUTRIN XL) 150 MG 24 hr tablet Take 1 tablet (150 mg total) by mouth daily. 02/18/20  Yes Jacelyn Pi, Lilia Argue, MD  busPIRone (BUSPAR) 7.5 MG tablet Take 1 tablet (7.5 mg total) by mouth 3 (three) times daily. 02/18/20  Yes Jacelyn Pi, Lilia Argue, MD  cefdinir (OMNICEF) 300 MG capsule Take 1 capsule (300 mg total) by mouth 2 (two) times daily for 10 days. 05/01/20 05/11/20 Yes Lorella Nimrod, MD  diclofenac sodium (VOLTAREN) 1 % GEL Apply 2 g topically daily as needed (for knee pain). 08/23/18  Yes Jacelyn Pi, Lilia Argue, MD  DULoxetine (CYMBALTA) 60 MG capsule TAKE 1 CAPSULE EVERY DAY Patient taking differently: Take 60 mg by mouth daily. 02/06/20  Yes Jacelyn Pi, Irma M, MD  Erenumab-aooe (AIMOVIG) 70 MG/ML SOAJ Inject 70 mg into the skin every 28 (twenty-eight) days. 04/08/20  Yes Jaffe, Adam R, DO  esomeprazole (NEXIUM) 40 MG capsule TAKE 1 CAPSULE EVERY DAY Patient taking differently: Take 40 mg by mouth daily. 09/12/19  Yes Jacelyn Pi, Lilia Argue, MD  famotidine (PEPCID) 40 MG tablet Take 40 mg by mouth daily. 02/12/20  Yes [provider]  gabapentin (NEURONTIN) 400 MG capsule Take 1 capsule (400 mg total) by mouth at bedtime. Patient taking differently: Take 600 mg by mouth 2 (two) times daily. 02/20/20  Yes Jacelyn Pi, Lilia Argue, MD  hydrOXYzine (ATARAX/VISTARIL) 50 MG tablet TAKE 1 TABLET EVERY 6 HOURS AS NEEDED Patient taking differently: Take 50 mg by mouth every 6  (six) hours as needed for anxiety. 06/29/19  Yes Jacelyn Pi, Lilia Argue, MD  icosapent Ethyl (VASCEPA) 1 g capsule Take 2 capsules (2 g total) by mouth 2 (two) times daily. 07/31/19  Yes Turner, Eber Hong, MD  levothyroxine (SYNTHROID) 50 MCG tablet TAKE 1 TABLET BY MOUTH EVERY OTHER DAY. BEFORE BREAKFAST, ALTERNATED WITH 75MCG TABLET. Patient taking differently: Take 50 mcg by mouth every other day. Before breakfast 11/04/19  Yes Jacelyn Pi, Lilia Argue, MD  metFORMIN (GLUCOPHAGE) 500 MG tablet Take 1 tablet (500 mg total) by mouth daily with breakfast. 02/26/20  Yes Jacelyn Pi, Lilia Argue, MD  ondansetron (ZOFRAN ODT) 4 MG disintegrating tablet Take 1 tablet (4 mg total) by mouth every 8 (eight) hours as needed for nausea or vomiting. 04/17/20  Yes Isla Pence, MD  Probiotic Product (ALIGN) 4 MG  CAPS Take 1 capsule (4 mg total) by mouth daily. 04/17/20  Yes Isla Pence, MD  QUEtiapine (SEROQUEL) 300 MG tablet TAKE 1 TABLET BY MOUTH EVERYDAY AT BEDTIME Patient taking differently: Take 300 mg by mouth at bedtime. 10/08/19  Yes Jacelyn Pi, Lilia Argue, MD  rizatriptan (MAXALT-MLT) 10 MG disintegrating tablet Take 1 tablet (10 mg total) by mouth as needed for migraine. May repeat in 2 hours if needed 02/18/20  Yes Jacelyn Pi, Irma M, MD  albuterol (VENTOLIN HFA) 108 (90 Base) MCG/ACT inhaler TAKE 2 PUFFS BY MOUTH EVERY 6 HOURS AS NEEDED FOR WHEEZE OR SHORTNESS OF BREATH 05/06/20   Kalev Temme, Laurita Quint, FNP  cetirizine (ZYRTEC) 10 MG tablet Take 1 tablet (10 mg total) by mouth at bedtime. 05/06/20   Cassady Turano, Laurita Quint, FNP  fluticasone (FLONASE) 50 MCG/ACT nasal spray PLACE 2 SPRAYS AT BEDTIME INTO BOTH NOSTRILS. 05/06/20   Million Maharaj, Laurita Quint, FNP  levothyroxine (SYNTHROID) 75 MCG tablet Take 1 tablet (75 mcg total) by mouth every other day. Before breakfast 05/06/20   [place] Zou, Laurita Quint, FNP  prazosin (MINIPRESS) 5 MG capsule Take 2-3 capsules (10-15 mg total) by mouth at bedtime. TAKE 2 TO 3 CAPSULES AT BEDTIME 05/06/20    Merita Hawks, Laurita Quint, FNP  QUEtiapine (SEROQUEL) 50 MG tablet TAKE 1 TABLET AT BEDTIME AS NEEDED 09/13/19   Jacelyn Pi, Lilia Argue, MD    Past Medical History:  Diagnosis Date  . Acute encephalopathy 06/25/2016  . Anxiety   . Cataract 08/31/2017   left eye  . Cholesterol serum increased   . Chronic back pain    chronic Rt low back pain. s/p L4-5 fusion. failed Rt facet injections. poss due to Rt SI joint dysfunction.  . Chronic kidney disease   . Depression   . Diastolic heart failure ([place])   . GERD (gastroesophageal reflux disease)   . Hyperlipidemia   . Hyperparathyroidism ([place])   . Hypertension   . Hypothyroidism   . Migraine   . Neuromuscular disorder ([place])   . Seizures ([place])    3 years ago,06/20/19  . Sleep apnea    cpap  . Syncope 08/30/2015  . Tachycardia   . Trochanteric bursitis of both hips 2012   Confirmed on MRI    Past Surgical History:  Procedure Laterality Date  . ABDOMINAL HYSTERECTOMY    . APPENDECTOMY  1986  . BACK SURGERY     L4-5 fusion  . CATARACT EXTRACTION    . KNEE SURGERY    . SHOULDER ARTHROSCOPY WITH SUBACROMIAL DECOMPRESSION Left 10/20/2018   Procedure: SHOULDER ARTHROSCOPY, ROTATOR CUFF DEBRIDEMENT, GLENOHUMERAL JOINT DEBRIDEMENT, ACROMIOPLASTY, DISTAL CLAVICLE RESECTION;  Surgeon: [name], MD;  Location: [facility];  Service: Orthopedics;  Laterality: Left;    Social History   Tobacco Use  . Smoking status: Never Smoker  . Smokeless tobacco: Never Used  Substance Use Topics  . Alcohol use: No    Family History  Problem Relation Age of Onset  . Diabetes Mother   . Heart disease Mother   . Kidney disease Mother   . Thyroid disease Mother   . Heart disease Father   . Seizures Father   . COPD Father   . Irritable bowel syndrome Father   . Cancer Maternal Grandmother   . Diabetes Sister   . Diabetes Brother   . Colon cancer Neg Hx   . Stomach cancer Neg Hx   . Colon polyps Neg Hx   . Esophageal cancer Neg Hx   .  Rectal cancer Neg Hx      Review of Systems  Constitutional: Negative for chills, fever and malaise/fatigue.  HENT: Negative for congestion, sinus pain and sore throat.   Eyes: Negative for blurred vision and double vision.  Respiratory: Negative for cough, shortness of breath and wheezing.   Cardiovascular: Negative for chest pain, palpitations and leg swelling.  Gastrointestinal: Negative for abdominal pain, blood in stool, constipation, diarrhea, heartburn, nausea and vomiting.  Genitourinary: Negative for dysuria, flank pain, frequency, hematuria and urgency.  Musculoskeletal: Positive for back pain. Negative for joint pain.  Skin: Negative for rash.  Neurological: Negative for dizziness, weakness and headaches.  Psychiatric/Behavioral: The patient is nervous/anxious and has insomnia.      OBJECTIVE:  Today's Vitals   05/06/20 1455  BP: 129/75  Pulse: (!) 108  Temp: 98 F (36.7 C)  SpO2: 98%  Weight: 190 lb (86.2 kg)  Height: _0  (1.6 m)   Body mass index is 33.66 kg/m.   Physical Exam Constitutional:      General: She is not in acute distress.    Appearance: Normal appearance. She is not ill-appearing.  HENT:     Head: Normocephalic.  Cardiovascular:     Rate and Rhythm: Normal rate and regular rhythm.     Pulses: Normal pulses.     Heart sounds: Normal heart sounds. No murmur heard. No friction rub. No gallop.   Pulmonary:     Effort: Pulmonary effort is normal. No respiratory distress.     Breath sounds: Normal breath sounds. No stridor. No wheezing, rhonchi or rales.  Abdominal:     General: Bowel sounds are normal.     Palpations: Abdomen is soft.     Tenderness: There is no abdominal tenderness.  Musculoskeletal:     Right lower leg: No edema.     Left lower leg: No edema.  Skin:    General: Skin is warm and dry.  Neurological:     Mental Status: She is alert and oriented to person, place, and time.  Psychiatric:        Mood and Affect: Mood normal.        Behavior:  Behavior normal.     No results found for this or any previous visit (from the past 24 hour(s)).  No results found.   ASSESSMENT and PLAN  Problem List Items Addressed This Visit      Cardiovascular and Mediastinum   Headache, migraine   Relevant Medications   prazosin (MINIPRESS) 5 MG capsule     Endocrine   Hypothyroid (Chronic)   Relevant Medications   levothyroxine (SYNTHROID) 75 MCG tablet     Nervous and Auditory   Dream anxiety disorder   Relevant Orders   Ambulatory referral to Psychiatry     Other   Moderate episode of recurrent major depressive disorder ([place])   Prediabetes   Relevant Orders   CBC with Differential   Insomnia due to other mental disorder - Primary   Relevant Medications   prazosin (MINIPRESS) 5 MG capsule   Allergies   Relevant Medications   fluticasone (FLONASE) 50 MCG/ACT nasal spray   cetirizine (ZYRTEC) 10 MG tablet    Other Visit Diagnoses    Hospital discharge follow-up       Relevant Orders   CMP14+EGFR   CBC with Differential   Magnesium   Phosphorus   Wheezing       Relevant Medications   albuterol (VENTOLIN HFA) 108 (90 Base) MCG/ACT inhaler  Will follow up with lab results.  Return in about 4 weeks (around 06/03/2020).   Huston Foley Temiloluwa [place]ared[place], FNP-BC [facility] at [facility] [place], [place] [address] Ph.  [phone] Fax [phone]

## 2020-05-06 NOTE — Patient Instructions (Addendum)
 Health Maintenance After Age 65 After age 66, you are at a higher risk for certain long-term diseases and infections as well as injuries from falls. Falls are a major cause of broken bones and head injuries in people who are older than age 66 injuries in people who are older than age 66. Getting regular preventive care can help to keep you healthy and well. Preventive care includes getting regular testing and making lifestyle changes as recommended by your health care provider. Talk with your health care provider about:  Which screenings and tests you should have. A screening is a test that checks for a disease when you have no symptoms.  A diet and exercise plan that is right for you. What should I know about screenings and tests to prevent falls? Screening and testing are the best ways to find a health problem early. Early diagnosis and treatment give you the best chance of managing medical conditions that are common after age 66. Certain conditions and lifestyle choices may make you more likely to have a fall. Your health care provider may recommend:  Regular vision checks. Poor vision and conditions such as cataracts can make you more likely to have a fall. If you wear glasses, make sure to get your prescription updated if your vision changes.  Medicine review. Work with your health care provider to regularly review all of the medicines you are taking, including over-the-counter medicines. Ask your health care provider about any side effects that may make you more likely to have a fall. Tell your health care provider if any medicines that you take make you feel dizzy or sleepy.  Osteoporosis screening. Osteoporosis is a condition that causes the bones to get weaker. This can make the bones weak and cause them to break more easily.  Blood pressure screening. Blood pressure changes and medicines to control blood pressure can make you feel dizzy.  Strength and balance checks. Your health care provider may recommend certain tests to check  your strength and balance while standing, walking, or changing positions.  Foot health exam. Foot pain and numbness, as well as not wearing proper footwear, can make you more likely to have a fall.  Depression screening. You may be more likely to have a fall if you have a fear of falling, feel emotionally low, or feel unable to do activities that you used to do.  Alcohol use screening. Using too much alcohol can affect your balance and may make you more likely to have a fall. What actions can I take to lower my risk of falls? General instructions  Talk with your health care provider about your risks for falling. Tell your health care provider if: ? You fall. Be sure to tell your health care provider about all falls, even ones that seem minor. ? You feel dizzy, sleepy, or off-balance.  Take over-the-counter and prescription medicines only as told by your health care provider. These include any supplements.  Eat a healthy diet and maintain a healthy weight. A healthy diet includes low-fat dairy products, low-fat (lean) meats, and fiber from whole grains, beans, and lots of fruits and vegetables. Home safety  Remove any tripping hazards, such as rugs, cords, and clutter.  Install safety equipment such as grab bars in bathrooms and safety rails on stairs.  Keep rooms and walkways well-lit. Activity   Follow a regular exercise program to stay fit. This will help you maintain your balance. Ask your health care provider what types of exercise are appropriate for you.  If you need a cane   or walker, use it as recommended by your health care provider.  Wear supportive shoes that have nonskid soles. Lifestyle  Do not drink alcohol if your health care provider tells you not to drink.  If you drink alcohol, limit how much you have: ? 0-1 drink a day for women. ? 0-2 drinks a day for men.  Be aware of how much alcohol is in your drink. In the [place] one drink equals one typical bottle of  beer (12 oz), one-half glass of wine (5 oz), or one shot of hard liquor (1 oz).  Do not use any products that contain nicotine or tobacco, such as cigarettes and e-cigarettes. If you need help quitting, ask your health care provider. Summary  Having a healthy lifestyle and getting preventive care can help to protect your health and wellness after age 66.  Screening and testing are the best way to find a health problem early and help you avoid having a fall. Early diagnosis and treatment give you the best chance for managing medical conditions that are more common for people who are older than age 66.  Falls are a major cause of broken bones and head injuries in people who are older than age 66 injuries in people who are older than age 66. Take precautions to prevent a fall at home.  Work with your health care provider to learn what changes you can make to improve your health and wellness and to prevent falls. This information is not intended to replace advice given to you by your health care provider. Make sure you discuss any questions you have with your health care provider. Document Revised: 08/24/2018 Document Reviewed: 03/16/2017 Elsevier Patient Education  2020 [employer]    If you have lab work done today you will be contacted with your lab results within the next 2 weeks.  If you have not heard from us then please contact us. The fastest way to get your results is to register for My Chart.   IF you received an x-ray today, you will receive an invoice from [facility]. Please contact [facility] at [phone] with questions or concerns regarding your invoice.   IF you received labwork today, you will receive an invoice from [facility]. Please contact [facility] at [phone] with questions or concerns regarding your invoice.   Our billing staff will not be able to assist you with questions regarding bills from these companies.  You will be contacted with the lab results as soon as they are available. The  fastest way to get your results is to activate your My Chart account. Instructions are located on the last page of this paperwork. If you have not heard from us regarding the results in 2 weeks, please contact this office.      

## 2020-05-07 LAB — MAGNESIUM: Magnesium: 2 mg/dL (ref 1.6–2.3)

## 2020-05-07 LAB — CMP14+EGFR
ALT: 37 IU/L — ABNORMAL HIGH (ref 0–32)
AST: 27 IU/L (ref 0–40)
Albumin/Globulin Ratio: 1.4 (ref 1.2–2.2)
Albumin: 4.1 g/dL (ref 3.8–4.8)
Alkaline Phosphatase: 122 IU/L — ABNORMAL HIGH (ref 44–121)
BUN/Creatinine Ratio: 13 (ref 12–28)
BUN: 20 mg/dL (ref 8–27)
Bilirubin Total: 0.3 mg/dL (ref 0.0–1.2)
CO2: 28 mmol/L (ref 20–29)
Calcium: 9.8 mg/dL (ref 8.7–10.3)
Chloride: 100 mmol/L (ref 96–106)
Creatinine, Ser: 1.5 mg/dL — ABNORMAL HIGH (ref 0.57–1.00)
GFR calc Af Amer: 42 mL/min/{1.73_m2} — ABNORMAL LOW (ref >59)
GFR calc non Af Amer: 36 mL/min/{1.73_m2} — ABNORMAL LOW (ref >59)
Globulin, Total: 3 g/dL (ref 1.5–4.5)
Glucose: 97 mg/dL (ref 65–99)
Potassium: 5 mmol/L (ref 3.5–5.2)
Sodium: 142 mmol/L (ref 134–144)
Total Protein: 7.1 g/dL (ref 6.0–8.5)

## 2020-05-07 LAB — CBC WITH DIFFERENTIAL/PLATELET
Basophils Absolute: 0.1 10*3/uL (ref 0.0–0.2)
Basos: 1 %
EOS (ABSOLUTE): 0.3 10*3/uL (ref 0.0–0.4)
Eos: 3 %
Hematocrit: 36.9 % (ref 34.0–46.6)
Hemoglobin: 12.2 g/dL (ref 11.1–15.9)
Immature Grans (Abs): 0.6 10*3/uL — ABNORMAL HIGH (ref 0.0–0.1)
Immature Granulocytes: 5 %
Lymphocytes Absolute: 3.3 10*3/uL — ABNORMAL HIGH (ref 0.7–3.1)
Lymphs: 28 %
MCH: 25.7 pg — ABNORMAL LOW (ref 26.6–33.0)
MCHC: 33.1 g/dL (ref 31.5–35.7)
MCV: 78 fL — ABNORMAL LOW (ref 79–97)
Monocytes Absolute: 0.7 10*3/uL (ref 0.1–0.9)
Monocytes: 6 %
Neutrophils Absolute: 7.1 10*3/uL — ABNORMAL HIGH (ref 1.4–7.0)
Neutrophils: 57 %
Platelets: 522 10*3/uL — ABNORMAL HIGH (ref 150–450)
RBC: 4.74 x10E6/uL (ref 3.77–5.28)
RDW: 15.9 % — ABNORMAL HIGH (ref 11.7–15.4)
WBC: 12.1 10*3/uL — ABNORMAL HIGH (ref 3.4–10.8)

## 2020-05-07 LAB — PHOSPHORUS: Phosphorus: 3.9 mg/dL (ref 3.0–4.3)

## 2020-05-20 ENCOUNTER — Other Ambulatory Visit: Payer: Self-pay

## 2020-05-20 ENCOUNTER — Ambulatory Visit (INDEPENDENT_AMBULATORY_CARE_PROVIDER_SITE_OTHER): Payer: Medicare Other | Admitting: Cardiology

## 2020-05-20 ENCOUNTER — Encounter: Payer: Self-pay | Admitting: Cardiology

## 2020-05-20 VITALS — BP 134/72 | HR 110 | Ht 63.0 in | Wt 189.0 lb

## 2020-05-20 DIAGNOSIS — I2583 Coronary atherosclerosis due to lipid rich plaque: Secondary | ICD-10-CM

## 2020-05-20 DIAGNOSIS — R0602 Shortness of breath: Secondary | ICD-10-CM | POA: Diagnosis not present

## 2020-05-20 DIAGNOSIS — R42 Dizziness and giddiness: Secondary | ICD-10-CM | POA: Diagnosis not present

## 2020-05-20 DIAGNOSIS — I10 Essential (primary) hypertension: Secondary | ICD-10-CM

## 2020-05-20 DIAGNOSIS — E782 Mixed hyperlipidemia: Secondary | ICD-10-CM

## 2020-05-20 DIAGNOSIS — I2510 Atherosclerotic heart disease of native coronary artery without angina pectoris: Secondary | ICD-10-CM | POA: Diagnosis not present

## 2020-05-20 DIAGNOSIS — R072 Precordial pain: Secondary | ICD-10-CM | POA: Diagnosis not present

## 2020-05-20 NOTE — Progress Notes (Signed)
Date:  05/20/2020   ID:  Jennifer Davidson, DOB 1955/06/09, MRN [id number]   PCP:  Just, Azalee Course, FNP  Cardiologist:  Armanda Magic, MD  Electrophysiologist:  None   Chief Complaint:  dizziness  History of Present Illness:    Jennifer Davidson is a 65 y.o. female with a hx of GERD, HTN, hypothyroidism, OSA on PAP therapy and diastolic CHF.  She also has a hx of dizziness and syncope.  She had a syncopal spell about 2 years ago when standing out in the driveway talking to her husband and was evaluated in the ER and workup was normal.  2D echo was normal at that time but she broke 6 ribs.  That time she felt lightheaded and felt like she was going to pass out.    When I saw her last fall she was having episodes of dizziness that were different from her event in 2017.  She also had had episodes sitting down reading where suddenly room starts to spin and she gets very dizzy.  She fell again 12/27 while walking across her living room floor and she lost control and fell into an ottoman.  She had no lightheadedness and did not feel like she was going to pass out.  2D echo 05/2019 showed normal LVF and G2DD. Event monitor showed PACs and PVCs.  Coronary CTA 06/2019 showed mild CAD of the mid LAD 25-49% and mild PE.    She was recently hospitalized in Dec with lower abdominal pain, fever  And N/V and was dx with dehydration and UTI with E Coli and treated with antibx and IVF.  She complained of some atypical CP with chest wall tenderness and EKG was normal and hsTrop neg x 2.  She was told to followup with Cardiology.  She tells me that the pain in her chest was sore to cough and has completely resolved.  She tells me that she intermittently has a sharp pain in upper chest for a fleeting moment. It is exertional and nonexertional and gets DOE with it but no diaphoresis.  She continues to have dizziness where the room is spinning.   She is here today for followup and is doing well.  She denies any chest pain or  pressure, SOB, DOE, PND, orthopnea, LE edema, dizziness, palpitations or syncope. She is compliant with her meds and is tolerating meds with no SE.    Prior CV studies:   The following studies were reviewed today:  2D echo 05/2019 IMPRESSIONS    1. Left ventricular ejection fraction, by visual estimation, is 60 to  65%. The left ventricle has normal function. There is mildly increased  left ventricular hypertrophy.  2. Left ventricular diastolic parameters are consistent with Grade II  diastolic dysfunction (pseudonormalization).  3. The left ventricle has no regional wall motion abnormalities.  4. Global right ventricle has normal systolic function.The right  ventricular size is normal. No increase in right ventricular wall  thickness.  5. Left atrial size was normal.  6. Right atrial size was normal.  7. Presence of pericardial fat pad.  8. Trivial pericardial effusion is present.  9. The pericardial effusion is anterior to the right ventricle.  10. The mitral valve is grossly normal. Trivial mitral valve  regurgitation.  11. The tricuspid valve is grossly normal.  12. The aortic valve is tricuspid. Aortic valve regurgitation is not  visualized.  13. The pulmonic valve was grossly normal. Pulmonic valve regurgitation is  trivial.  14.  TR signal is inadequate for assessing pulmonary artery systolic  pressure.  15. The inferior vena cava is normal in size with greater than 50%  respiratory variability, suggesting right atrial pressure of 3 mmHg.   Coronary CTA 06/2019 FINDINGS: Image quality: excellent.  Noise artifact is: Limited.  Coronary Arteries:  Normal coronary origin.  Left dominance.  Left main: The left main is a large caliber vessel with a normal take off from the left coronary cusp that trifurcates into a LAD, LCX, and ramus intermedius. The ostial left main contains minimal non-calcified plaque (<25%).  Left anterior descending artery: The  proximal LAD contains minimal calcified plaque (<25%). The mid LAD contains mild, mixed density plaque (25-49%). The distal LAD is patent. A small distal LAD myocardial bridge is present. The LAD gives off 2 patent diagonal branches.  Ramus intermedius: Small vessel that is patent with no evidence of plaque or stenosis.  Left circumflex artery: The LCX is dominant. The proximal LCX contains minimal non-calcified plaque (<25%). The mid LCX contains minimal calcified plaque (<25%). The distal LCX is patent. The LCX gives off 1 large patent OM branch. The LCX terminates as a patent PDA.  Right coronary artery: The RCA is non-dominant with normal take off from the right coronary cusp. There is no evidence of plaque or stenosis.  Right Atrium: Right atrial size is within normal limits.  Right Ventricle: The right ventricular cavity is within normal limits.  Left Atrium: Left atrial size is normal in size with no left atrial appendage filling defect. There appears to be a small diverticulum of the interatrial septum. This does not appear to represent a fistulous connection or PFO.  Left Ventricle: The ventricular cavity size is within normal limits. There are no stigmata of prior infarction. There is no abnormal filling defect.  Pulmonary arteries: Normal in size without proximal filling defect.  Pulmonary veins: Normal pulmonary venous drainage.  Pericardium: Normal thickness.  Small pericardial effusion present.  Cardiac valves: The aortic valve is trileaflet without significant calcification. The mitral valve is normal structure without significant calcification.  Aorta: Normal caliber with no significant disease.  Extra-cardiac findings: See attached radiology report for non-cardiac structures.  IMPRESSION: 1. Coronary calcium score of 216. This was 89th percentile for age and sex matched control.  2. Normal coronary origin with left dominance.  3.  Mild CAD (25-49%) in the mid LAD.  4. Small pericardial effusion.  RECOMMENDATIONS: 1. Mild non-obstructive CAD (25-49%). Consider non-atherosclerotic causes of chest pain. Consider preventive therapy and risk factor modification.   Past Medical History:  Diagnosis Date  . Acute encephalopathy 06/25/2016  . Anxiety   . Cataract 08/31/2017   left eye  . Cholesterol serum increased   . Chronic back pain    chronic Rt low back pain. s/p L4-5 fusion. failed Rt facet injections. poss due to Rt SI joint dysfunction.  . Chronic kidney disease   . Depression   . Diastolic heart failure (HCC)   . GERD (gastroesophageal reflux disease)   . Hyperlipidemia   . Hyperparathyroidism (HCC)   . Hypertension   . Hypothyroidism   . Migraine   . Neuromuscular disorder (HCC)   . Seizures (HCC)    3 years ago,06/20/19  . Sleep apnea    cpap  . Syncope 08/30/2015  . Tachycardia   . Trochanteric bursitis of both hips 2012   Confirmed on MRI   Past Surgical History:  Procedure Laterality Date  . ABDOMINAL HYSTERECTOMY    .  APPENDECTOMY  1986  . BACK SURGERY     L4-5 fusion  . CATARACT EXTRACTION    . KNEE SURGERY    . SHOULDER ARTHROSCOPY WITH SUBACROMIAL DECOMPRESSION Left 10/20/2018   Procedure: SHOULDER ARTHROSCOPY, ROTATOR CUFF DEBRIDEMENT, GLENOHUMERAL JOINT DEBRIDEMENT, ACROMIOPLASTY, DISTAL CLAVICLE RESECTION;  Surgeon: [name], MD;  Location: [facility];  Service: Orthopedics;  Laterality: Left;     Current Meds  Medication Sig  . albuterol (VENTOLIN HFA) 108 (90 Base) MCG/ACT inhaler TAKE 2 PUFFS BY MOUTH EVERY 6 HOURS AS NEEDED FOR WHEEZE OR SHORTNESS OF BREATH  . aspirin EC 81 MG tablet Take 1 tablet (81 mg total) by mouth daily.  Marland Kitchen atorvastatin (LIPITOR) 20 MG tablet TAKE 1 TABLET BY MOUTH EVERY DAY (Patient taking differently: Take 20 mg by mouth daily.)  . buPROPion (WELLBUTRIN XL) 150 MG 24 hr tablet Take 1 tablet (150 mg total) by mouth daily.  . busPIRone (BUSPAR) 7.5 MG  tablet Take 1 tablet (7.5 mg total) by mouth 3 (three) times daily.  . cetirizine (ZYRTEC) 10 MG tablet Take 1 tablet (10 mg total) by mouth at bedtime.  . diclofenac sodium (VOLTAREN) 1 % GEL Apply 2 g topically daily as needed (for knee pain).  . DULoxetine (CYMBALTA) 60 MG capsule TAKE 1 CAPSULE EVERY DAY (Patient taking differently: Take 60 mg by mouth daily.)  . Erenumab-aooe (AIMOVIG) 70 MG/ML SOAJ Inject 70 mg into the skin every 28 (twenty-eight) days.  Marland Kitchen esomeprazole (NEXIUM) 40 MG capsule TAKE 1 CAPSULE EVERY DAY (Patient taking differently: Take 40 mg by mouth daily.)  . famotidine (PEPCID) 40 MG tablet Take 40 mg by mouth daily.  . fluticasone (FLONASE) 50 MCG/ACT nasal spray PLACE 2 SPRAYS AT BEDTIME INTO BOTH NOSTRILS.  Marland Kitchen gabapentin (NEURONTIN) 400 MG capsule Take 1 capsule (400 mg total) by mouth at bedtime. (Patient taking differently: Take 600 mg by mouth 2 (two) times daily.)  . hydrOXYzine (ATARAX/VISTARIL) 50 MG tablet TAKE 1 TABLET EVERY 6 HOURS AS NEEDED (Patient taking differently: Take 50 mg by mouth every 6 (six) hours as needed for anxiety.)  . icosapent Ethyl (VASCEPA) 1 g capsule Take 2 capsules (2 g total) by mouth 2 (two) times daily.  Marland Kitchen levothyroxine (SYNTHROID) 50 MCG tablet TAKE 1 TABLET BY MOUTH EVERY OTHER DAY. BEFORE BREAKFAST, ALTERNATED WITH 75MCG TABLET. (Patient taking differently: Take 50 mcg by mouth every other day. Before breakfast)  . levothyroxine (SYNTHROID) 75 MCG tablet Take 1 tablet (75 mcg total) by mouth every other day. Before breakfast  . metFORMIN (GLUCOPHAGE) 500 MG tablet Take 1 tablet (500 mg total) by mouth daily with breakfast.  . ondansetron (ZOFRAN ODT) 4 MG disintegrating tablet Take 1 tablet (4 mg total) by mouth every 8 (eight) hours as needed for nausea or vomiting.  . prazosin (MINIPRESS) 5 MG capsule Take 2-3 capsules (10-15 mg total) by mouth at bedtime. TAKE 2 TO 3 CAPSULES AT BEDTIME  . Probiotic Product (ALIGN) 4 MG CAPS Take 1  capsule (4 mg total) by mouth daily.  . QUEtiapine (SEROQUEL) 300 MG tablet TAKE 1 TABLET BY MOUTH EVERYDAY AT BEDTIME (Patient taking differently: Take 300 mg by mouth at bedtime.)  . rizatriptan (MAXALT-MLT) 10 MG disintegrating tablet Take 1 tablet (10 mg total) by mouth as needed for migraine. May repeat in 2 hours if needed     Allergies:   Tramadol and Trazodone and nefazodone   Social History   Tobacco Use  . Smoking status: Never Smoker  .  Smokeless tobacco: Never Used  Vaping Use  . Vaping Use: Never used  Substance Use Topics  . Alcohol use: No  . Drug use: No    Comment: 08-31-2016 PER PT NO      Family Hx: The patient's family history includes COPD in her father; Cancer in her maternal grandmother; Diabetes in her brother, mother, and sister; Heart disease in her father and mother; Irritable bowel syndrome in her father; Kidney disease in her mother; Seizures in her father; Thyroid disease in her mother. There is no history of Colon cancer, Stomach cancer, Colon polyps, Esophageal cancer, or Rectal cancer.  ROS:   Please see the history of present illness.     All other systems reviewed and are negative.   Labs/Other Tests and Data Reviewed:    Recent Labs: 06/04/2019: NT-Pro BNP 73 02/18/2020: TSH 1.530 05/06/2020: ALT 37; BUN 20; Creatinine, Ser 1.50; Hemoglobin 12.2; Magnesium 2.0; Platelets 522; Potassium 5.0; Sodium 142   Recent Lipid Panel Lab Results  Component Value Date/Time   CHOL 185 02/18/2020 11:57 AM   TRIG 154 (H) 02/18/2020 11:57 AM   HDL 70 02/18/2020 11:57 AM   CHOLHDL 2.6 02/18/2020 11:57 AM   CHOLHDL 5.5 (H) 01/29/2016 10:09 AM   LDLCALC 89 02/18/2020 11:57 AM    Wt Readings from Last 3 Encounters:  05/20/20 189 lb (85.7 kg)  05/06/20 190 lb (86.2 kg)  04/29/20 180 lb (81.6 kg)    2D echo 05/2019 IMPRESSIONS    1. Left ventricular ejection fraction, by visual estimation, is 60 to  65%. The left ventricle has normal function. There  is mildly increased  left ventricular hypertrophy.  2. Left ventricular diastolic parameters are consistent with Grade II  diastolic dysfunction (pseudonormalization).  3. The left ventricle has no regional wall motion abnormalities.  4. Global right ventricle has normal systolic function.The right  ventricular size is normal. No increase in right ventricular wall  thickness.  5. Left atrial size was normal.  6. Right atrial size was normal.  7. Presence of pericardial fat pad.  8. Trivial pericardial effusion is present.  9. The pericardial effusion is anterior to the right ventricle.  10. The mitral valve is grossly normal. Trivial mitral valve  regurgitation.  11. The tricuspid valve is grossly normal.  12. The aortic valve is tricuspid. Aortic valve regurgitation is not  visualized.  13. The pulmonic valve was grossly normal. Pulmonic valve regurgitation is  trivial.  14. TR signal is inadequate for assessing pulmonary artery systolic  pressure.  15. The inferior vena cava is normal in size with greater than 50%  respiratory variability, suggesting right atrial pressure of 3 mmHg.   Coronary CTA 05/2019 IMPRESSION: 1. Coronary calcium score of 216. This was 89th percentile for age and sex matched control.  2. Normal coronary origin with left dominance.  3. Mild CAD (25-49%) in the mid LAD.  4. Small pericardial effusion.  RECOMMENDATIONS: 1. Mild non-obstructive CAD (25-49%). Consider non-atherosclerotic causes of chest pain. Consider preventive therapy and risk factor modification.  Objective:    Vital Signs:  BP 134/72   Pulse (!) 110   Ht 5\' 3"  (1.6 m)   Wt 189 lb (85.7 kg)   SpO2 98%   BMI 33.48 kg/m   Orthostatic VS for the past 24 hrs (Last 3 readings):  BP- Lying Pulse- Lying BP- Sitting Pulse- Sitting BP- Standing at 0 minutes Pulse- Standing at 0 minutes BP- Standing at 3 minutes Pulse- Standing at  3 minutes  05/20/20 1424 127/84 102 121/82  107 105/71 115 126/72 115    GEN: Well nourished, well developed in no acute distress HEENT: Normal NECK: No JVD; No carotid bruits LYMPHATICS: No lymphadenopathy CARDIAC:RRR, no murmurs, rubs, gallops RESPIRATORY:  Clear to auscultation without rales, wheezing or rhonchi  ABDOMEN: Soft, non-tender, non-distended MUSCULOSKELETAL:  No edema; No deformity  SKIN: Warm and dry NEUROLOGIC:  Alert and oriented x 3 PSYCHIATRIC:  Normal affect   EKG was performed today and showed sinus tachycardia at 105bpm with no ST changes  ASSESSMENT & PLAN:    1.  Dizziness -her sx in the past have really sounded more vertiginous than lightheadedness -she has had worsening vertigo and is seeing her PCP tomorrow>>sx improved with Meclizine -event monitor showed occasional PACs and PVCs -orthostatics today are mildly abnormal but I do not think this is the etiology of her dizziness that sounds like vertigo -I have encouraged her to push fluids to at least 64oz daily, liberalize Na, avoid caffeine and ETOH  2.  SOB -? Due to obesity and deconditioning  -she does have CRFs including HTN, HLD and fm hx of CAD -coronary CTA 06/2019 showed a coronary Ca score of 216 with mild CAD in the mid LAD of 25-49% -2D echo showed normal LVF with EF 60-65% with G2DD and trivial pericardial effusion -she  still has occasional DOE that she thinks is worse -she was prescribed an inhaler that she says has helped -continue ASA, statin  3.  HTN -BP well controlled on exam today -she has not been on any meds recently   4.  HLD -followed by her PCP -LDL goal < 70 -LDL in June was 64 -continue Lipitor 20mg  daily and Vascepa 2gm BID  5.  ASCAD -mild by coronary CTA with 25-49% mLAD lesion -she recently had some sharp chest pain and pain with palpation of her chest and with cough that was felt to be MSK when she was in the hospital with UTI -she has continued to have the sharp pain that occurs with exertion and at  rest and is new from when I saw her last -I will get a Lexiscan myoview to rule out ischemia -Shared Decision Making/Informed Consent{ All outpatient stress tests require an informed consent ) ATTESTATION ORDER       : The risks [chest pain, shortness of breath, cardiac arrhythmias, dizziness, blood pressure fluctuations, myocardial infarction, stroke/transient ischemic attack, nausea, vomiting, allergic reaction, radiation exposure, metallic taste sensation and life-threatening complications (estimated to be 1 in 10,000)], benefits (risk stratification, diagnosing coronary artery disease, treatment guidance) and alternatives of a nuclear stress test were discussed in detail with Ms. Devos and she agrees to proceed. -continue ASA 81mg  daily and statin  6.  Obesity -encouraged her to continue to exercise as weight loss will continue to improve her SOB  7.  Pericardial effusion -small by coronary CTA and echo a year ago -I will repeat 2D echo to make sure this has resolved  Followup with PA 4 weeks  Medication Adjustments/Labs and Tests Ordered: Current medicines are reviewed at length with the patient today.  Concerns regarding medicines are outlined above.  Tests Ordered: No orders of the defined types were placed in this encounter.  Medication Changes: No orders of the defined types were placed in this encounter.   Disposition:  Follow up prn  Signed, Brooke Dare, MD  05/20/2020 2:21 PM    [facility]

## 2020-05-20 NOTE — Patient Instructions (Addendum)
Medication Instructions:  Your physician recommends that you continue on your current medications as directed. Please refer to the Current Medication list given to you today.  *If you need a refill on your cardiac medications before your next appointment, please call your pharmacy*  Testing/Procedures: Your physician has requested that you have an echocardiogram. Echocardiography is a painless test that uses sound waves to create images of your heart. It provides your doctor with information about the size and shape of your heart and how well your heart's chambers and valves are working. This procedure takes approximately one hour. There are no restrictions for this procedure.  Your physician has requested that you have a lexiscan myoview. For further information please visit [email or web address] Please follow instruction sheet, as given.  Follow-Up: At [facility], you and your health needs are our priority.  As part of our continuing mission to provide you with exceptional heart care, we have created designated Provider Care Teams.  These Care Teams include your primary Cardiologist (physician) and Advanced Practice Providers (APPs -  Physician Assistants and Nurse Practitioners) who all work together to provide you with the care you need, when you need it.  Your next appointment:   4 weeks  The format for your next appointment:   In Person  Provider:   You may see Armanda Magic, MD or one of the following Advanced Practice Providers on your designated Care Team:    Ronie Spies, [name]  Jacolyn Reedy, [name]

## 2020-05-21 ENCOUNTER — Encounter: Payer: Self-pay | Admitting: Family Medicine

## 2020-05-21 ENCOUNTER — Ambulatory Visit (INDEPENDENT_AMBULATORY_CARE_PROVIDER_SITE_OTHER): Payer: Medicare Other | Admitting: Family Medicine

## 2020-05-21 VITALS — BP 97/61 | HR 124 | Temp 97.1°F | Ht 63.0 in | Wt 189.0 lb

## 2020-05-21 DIAGNOSIS — F515 Nightmare disorder: Secondary | ICD-10-CM

## 2020-05-21 DIAGNOSIS — H8110 Benign paroxysmal vertigo, unspecified ear: Secondary | ICD-10-CM

## 2020-05-21 DIAGNOSIS — F4310 Post-traumatic stress disorder, unspecified: Secondary | ICD-10-CM

## 2020-05-21 MED ORDER — MECLIZINE HCL 50 MG PO TABS: 25.0000 mg | ORAL_TABLET | Freq: Two times a day (BID) | ORAL | 3 refills | Status: AC | PRN

## 2020-05-21 NOTE — Progress Notes (Signed)
1/5/202212:55 PM  Jennifer Davidson 1956/03/10, 65 y.o., female [id number]  Chief Complaint  Patient presents with  . Dizziness    Describes the room is spinning , happening every time she gets up - had cardio appt yesterday dr Mayford Knife    HPI:   Patient is a 65 y.o. female with past medical history significant for HTN, HLD, dream disorder, anxiety who presents today for dizziness.  HLD (followed by Cards) Aspirin 81mg  daily Atorvastatin 20mg  daily Vascepa 2 g daily  Hypothyroid Levothyroxine alt 67mcg/75mcg  Pre-Diabetes Metformin 500mg  daily Recent Labs       Lab Results  Component Value Date   HGBA1C 5.7 (H) 04/29/2020     GI Zofran (nausea) Probiotic daily Famotidine/Nexium 40mg  daily? (GERD)  Asthma/Allergies Albuterol PRN Flonase daily Cetrizine at night  Pain Gabapentin 400 mg daily Cymbalta 60 mg daily Voltaren gel prn  Anxiety/Depression (psych referral placed) Wellbutrin 150 mg daily Buspar 7.5 mg tid Hydroxyzine 50 mg prn Prazosin 10-15 mg qhs (nightmares) Has been taking the 15mg  per night Seroquel 300 mg qhs Prior to illness did have months without  Migraines (managed by Neuro) Aimovig monthly injections Maxalt 10mg  prn  Continues to have issues with dizziness Cardiology feels like this may be vertigo Has a ECHO ordered Has been taking OTC meclizine Nightmares continue to get worse due to her childhood issues   Depression screen [facility] 2/9 05/21/2020 05/06/2020 11/16/2019  Decreased Interest 0 0 1  Down, Depressed, Hopeless 0 0 1  PHQ - 2 Score 0 0 2  Altered sleeping - - 3  Tired, decreased energy - - 1  Change in appetite - - 1  Feeling bad or failure about yourself  - - 0  Trouble concentrating - - 0  Moving slowly or fidgety/restless - - 1  Suicidal thoughts - - 0  PHQ-9 Score - - 8  Difficult doing work/chores - - Not difficult at all  Some recent data might be hidden    Fall Risk  05/21/2020 05/06/2020 03/27/2020 11/16/2019  10/05/2019  Falls in the past year? 0 0 1 0 0  Comment - - - - -  Number falls in past yr: 0 0 1 0 0  Injury with Fall? 0 0 0 0 0  Comment - - - - -  Risk Factor Category  - - - - -  Risk for fall due to : - - - - -  Risk for fall due to: Comment - - - - -  Follow up Falls evaluation completed Falls evaluation completed - Falls evaluation completed -     Allergies  Allergen Reactions  . Tramadol Nausea And Vomiting  . Trazodone And Nefazodone Other (See Comments)    Per pt trazodone caused hallucinations and behavior changes     Prior to Admission medications   Medication Sig Start Date End Date Taking? Authorizing Provider  albuterol (VENTOLIN HFA) 108 (90 Base) MCG/ACT inhaler TAKE 2 PUFFS BY MOUTH EVERY 6 HOURS AS NEEDED FOR WHEEZE OR SHORTNESS OF BREATH 05/06/20  Yes Sharrell Krawiec, 07/19/2020, FNP  aspirin EC 81 MG tablet Take 1 tablet (81 mg total) by mouth daily. 07/04/19  Yes Turner, 13/03/2020, MD  atorvastatin (LIPITOR) 20 MG tablet TAKE 1 TABLET BY MOUTH EVERY DAY Patient taking differently: Take 20 mg by mouth daily. 01/29/20  Yes 10/07/2019, 05/08/20, MD  buPROPion (WELLBUTRIN XL) 150 MG 24 hr tablet Take 1 tablet (150 mg total) by mouth daily. 02/18/20  Yes Lezlie Lye, Meda Coffee, MD  busPIRone (BUSPAR) 7.5 MG tablet Take 1 tablet (7.5 mg total) by mouth 3 (three) times daily. 02/18/20  Yes Lezlie Lye, Meda Coffee, MD  cetirizine (ZYRTEC) 10 MG tablet Take 1 tablet (10 mg total) by mouth at bedtime. 05/06/20  Yes Diala Waxman, Azalee Course, FNP  diclofenac sodium (VOLTAREN) 1 % GEL Apply 2 g topically daily as needed (for knee pain). 08/23/18  Yes Lezlie Lye, Meda Coffee, MD  DULoxetine (CYMBALTA) 60 MG capsule TAKE 1 CAPSULE EVERY DAY Patient taking differently: Take 60 mg by mouth daily. 02/06/20  Yes Lezlie Lye, Irma M, MD  Erenumab-aooe (AIMOVIG) 70 MG/ML SOAJ Inject 70 mg into the skin every 28 (twenty-eight) days. 04/08/20  Yes Jaffe, Adam R, DO  esomeprazole (NEXIUM) 40 MG capsule TAKE 1 CAPSULE  EVERY DAY Patient taking differently: Take 40 mg by mouth daily. 09/12/19  Yes Lezlie Lye, Irma M, MD  fluticasone (FLONASE) 50 MCG/ACT nasal spray PLACE 2 SPRAYS AT BEDTIME INTO BOTH NOSTRILS. 05/06/20  Yes Mersedes Alber, Azalee Course, FNP  gabapentin (NEURONTIN) 400 MG capsule Take 1 capsule (400 mg total) by mouth at bedtime. Patient taking differently: Take 600 mg by mouth 2 (two) times daily. 02/20/20  Yes Lezlie Lye, Meda Coffee, MD  hydrOXYzine (ATARAX/VISTARIL) 50 MG tablet TAKE 1 TABLET EVERY 6 HOURS AS NEEDED Patient taking differently: Take 50 mg by mouth every 6 (six) hours as needed for anxiety. 06/29/19  Yes Lezlie Lye, Meda Coffee, MD  icosapent Ethyl (VASCEPA) 1 g capsule Take 2 capsules (2 g total) by mouth 2 (two) times daily. 07/31/19  Yes Turner, Cornelious Bryant, MD  levothyroxine (SYNTHROID) 50 MCG tablet TAKE 1 TABLET BY MOUTH EVERY OTHER DAY. BEFORE BREAKFAST, ALTERNATED WITH TABLET. Patient taking differently: Take 50 mcg by mouth every other day. Before breakfast 11/04/19  Yes Lezlie Lye, Meda Coffee, MD  levothyroxine (SYNTHROID) 75 MCG tablet Take 1 tablet (75 mcg total) by mouth every other day. Before breakfast 05/06/20  Yes Chaunice Obie, Azalee Course, FNP  meclizine (ANTIVERT) 25 MG tablet Take 25 mg by mouth 3 (three) times daily as needed for dizziness.   Yes [provider]  metFORMIN (GLUCOPHAGE) 500 MG tablet Take 1 tablet (500 mg total) by mouth daily with breakfast. 02/26/20  Yes Lezlie Lye, Meda Coffee, MD  ondansetron (ZOFRAN ODT) 4 MG disintegrating tablet Take 1 tablet (4 mg total) by mouth every 8 (eight) hours as needed for nausea or vomiting. 04/17/20  Yes Jacalyn Lefevre, MD  prazosin (MINIPRESS) 5 MG capsule Take 2-3 capsules (10-15 mg total) by mouth at bedtime. TAKE 2 TO 3 CAPSULES AT BEDTIME 05/06/20  Yes Sharbel Sahagun, Azalee Course, FNP  Probiotic Product (ALIGN) 4 MG CAPS Take 1 capsule (4 mg total) by mouth daily. 04/17/20  Yes Jacalyn Lefevre, MD  QUEtiapine (SEROQUEL) 300 MG tablet TAKE 1  TABLET BY MOUTH EVERYDAY AT BEDTIME Patient taking differently: Take 300 mg by mouth at bedtime. 10/08/19  Yes Lezlie Lye, Meda Coffee, MD  rizatriptan (MAXALT-MLT) 10 MG disintegrating tablet Take 1 tablet (10 mg total) by mouth as needed for migraine. May repeat in 2 hours if needed 02/18/20  Yes Lezlie Lye, Meda Coffee, MD    Past Medical History:  Diagnosis Date  . Acute encephalopathy 06/25/2016  . Anxiety   . Cataract 08/31/2017   left eye  . Cholesterol serum increased   . Chronic back pain    chronic Rt low back pain. s/p L4-5 fusion. failed Rt  facet injections. poss due to Rt SI joint dysfunction.  . Chronic kidney disease   . Depression   . Diastolic heart failure (HCC)   . GERD (gastroesophageal reflux disease)   . Hyperlipidemia   . Hyperparathyroidism (HCC)   . Hypertension   . Hypothyroidism   . Migraine   . Neuromuscular disorder (HCC)   . Seizures (HCC)    3 years ago,06/20/19  . Sleep apnea    cpap  . Syncope 08/30/2015  . Tachycardia   . Trochanteric bursitis of both hips 2012   Confirmed on MRI    Past Surgical History:  Procedure Laterality Date  . ABDOMINAL HYSTERECTOMY    . APPENDECTOMY  1986  . BACK SURGERY     L4-5 fusion  . CATARACT EXTRACTION    . KNEE SURGERY    . SHOULDER ARTHROSCOPY WITH SUBACROMIAL DECOMPRESSION Left 10/20/2018   Procedure: SHOULDER ARTHROSCOPY, ROTATOR CUFF DEBRIDEMENT, GLENOHUMERAL JOINT DEBRIDEMENT, ACROMIOPLASTY, DISTAL CLAVICLE RESECTION;  Surgeon: Jodi Geralds, MD;  Location: [facility];  Service: Orthopedics;  Laterality: Left;    Social History   Tobacco Use  . Smoking status: Never Smoker  . Smokeless tobacco: Never Used  Substance Use Topics  . Alcohol use: No    Family History  Problem Relation Age of Onset  . Diabetes Mother   . Heart disease Mother   . Kidney disease Mother   . Thyroid disease Mother   . Heart disease Father   . Seizures Father   . COPD Father   . Irritable bowel syndrome Father   . Cancer  Maternal Grandmother   . Diabetes Sister   . Diabetes Brother   . Colon cancer Neg Hx   . Stomach cancer Neg Hx   . Colon polyps Neg Hx   . Esophageal cancer Neg Hx   . Rectal cancer Neg Hx     Review of Systems  Constitutional: Negative for chills, fever and malaise/fatigue.  HENT: Positive for tinnitus.   Eyes: Negative for blurred vision and double vision.  Respiratory: Negative for cough, shortness of breath and wheezing.   Cardiovascular: Negative for chest pain, palpitations and leg swelling.  Gastrointestinal: Negative for abdominal pain, blood in stool, constipation, diarrhea, heartburn, nausea and vomiting.  Genitourinary: Negative for dysuria, frequency and hematuria.  Musculoskeletal: Negative for back pain and joint pain.  Skin: Negative for rash.  Neurological: Positive for dizziness. Negative for weakness and headaches.  Psychiatric/Behavioral: Positive for depression. Negative for suicidal ideas (no current, yes in the past). The patient is nervous/anxious and has insomnia.        Nightmares     OBJECTIVE:  Today's Vitals   05/21/20 0959  BP: 97/61  Pulse: (!) 124  Temp: (!) 97.1 F (36.2 C)  SpO2: 99%  Weight: 189 lb (85.7 kg)  Height: 5\' 3"  (1.6 m)   Body mass index is 33.48 kg/m.   Physical Exam Constitutional:      General: She is not in acute distress.    Appearance: Normal appearance. She is not ill-appearing.  HENT:     Head: Normocephalic.     Right Ear: Tympanic membrane, ear canal and external ear normal. There is no impacted cerumen.     Left Ear: Tympanic membrane, ear canal and external ear normal. There is no impacted cerumen.  Cardiovascular:     Rate and Rhythm: Normal rate and regular rhythm.     Pulses: Normal pulses.     Heart sounds: Normal heart sounds. No  murmur heard. No friction rub. No gallop.   Pulmonary:     Effort: Pulmonary effort is normal. No respiratory distress.     Breath sounds: Normal breath sounds. No  stridor. No wheezing, rhonchi or rales.  Abdominal:     General: Bowel sounds are normal.     Palpations: Abdomen is soft.     Tenderness: There is no abdominal tenderness.  Musculoskeletal:     Right lower leg: No edema.     Left lower leg: No edema.  Skin:    General: Skin is warm and dry.  Neurological:     Mental Status: She is alert and oriented to person, place, and time.  Psychiatric:        Mood and Affect: Mood normal.        Behavior: Behavior normal.     No results found for this or any previous visit (from the past 24 hour(s)).  No results found.   ASSESSMENT and PLAN  Problem List Items Addressed This Visit      Nervous and Auditory   Dream anxiety disorder   Relevant Orders   Ambulatory referral to Psychiatry    Other Visit Diagnoses    Benign paroxysmal positional vertigo, unspecified laterality    -  Primary   Relevant Medications   meclizine (ANTIVERT) 50 MG tablet   Other Relevant Orders   Ambulatory referral to ENT     Plan:  Discussed importance of psychiatry referral  Continue Meclizine  Follow up with ENT  Continue to follow up with cardiology   Return in about 4 weeks (around 06/18/2020).   Huston Foley Chandria Rookstool, FNP-BC Primary Care at [place], [place] [address] Ph.  [phone] Fax [phone]

## 2020-05-21 NOTE — Patient Instructions (Addendum)
Major Depressive Disorder, Adult Major depressive disorder (MDD) is a mental health condition. MDD often makes you feel sad, hopeless, or helpless. MDD can also cause symptoms in your body. MDD can affect your:  Work.  School.  Relationships.  Other normal activities. MDD can range from mild to very bad. It may occur once (single episode MDD). It can also occur many times (recurrent MDD). The main symptoms of MDD often include:  Feeling sad, depressed, or irritable most of the time.  Loss of interest. MDD symptoms also include:  Sleeping too much or too little.  Eating too much or too little.  A change in your weight.  Feeling tired (fatigue) or having low energy.  Feeling worthless.  Feeling guilty.  Trouble making decisions.  Trouble thinking clearly.  Thoughts of suicide or harming others.  Feeling weak.  Feeling agitated.  Keeping yourself from being around other people (isolation). Follow these instructions at home: Activity  Do these things as told by your doctor: ? Go back to your normal activities. ? Exercise regularly. ? Spend time outdoors. Alcohol  Talk with your doctor about how alcohol can affect your antidepressant medicines.  Do not drink alcohol. Or, limit how much alcohol you drink. ? This means no more than 1 drink a day for nonpregnant women and 2 drinks a day for men. One drink equals one of these:  12 oz of beer.  5 oz of wine.  1 oz of hard liquor. General instructions  Take over-the-counter and prescription medicines only as told by your doctor.  Eat a healthy diet.  Get plenty of sleep.  Find activities that you enjoy. Make time to do them.  Think about joining a support group. Your doctor may be able to suggest a group for you.  Keep all follow-up visits as told by your doctor. This is important. Where to find more information:  [employer] on Mental Illness: ? www.nami.org  [place] [employer] of  Mental Health: ? http://www.maynard.net/  National Suicide Prevention Lifeline: ? (641)219-4334. This is free, 24-hour help. Contact a doctor if:  Your symptoms get worse.  You have new symptoms. Get help right away if:  You self-harm.  You see, hear, taste, smell, or feel things that are not present (hallucinate). If you ever feel like you may hurt yourself or others, or have thoughts about taking your own life, get help right away. You can go to your nearest emergency department or call:  Your local emergency services (911 in the [place]  A suicide crisis helpline, such as the National Suicide Prevention Lifeline: ? (380)480-5205. This is open 24 hours a day. This information is not intended to replace advice given to you by your health care provider. Make sure you discuss any questions you have with your health care provider. Document Revised: 04/15/2017 Document Reviewed: 01/18/2016 Elsevier Patient Education  [employer].    If you have lab work done today you will be contacted with your lab results within the next 2 weeks.  If you have not heard from [place] then please contact us. The fastest way to get your results is to register for My Chart.   IF you received an x-ray today, you will receive an invoice from [facility]. Please contact [facility] at [phone] with questions or concerns regarding your invoice.   IF you received labwork today, you will receive an invoice from [facility]. Please contact [facility] at [phone] with questions or concerns regarding your invoice.  Our billing staff will not be able to assist you with questions regarding bills from these companies.  You will be contacted with the lab results as soon as they are available. The fastest way to get your results is to activate your My Chart account. Instructions are located on the last page of this paperwork. If you have not heard from [place] regarding the results in 2 weeks, please  contact this office.

## 2020-05-22 DIAGNOSIS — M19011 Primary osteoarthritis, right shoulder: Secondary | ICD-10-CM | POA: Diagnosis not present

## 2020-05-22 DIAGNOSIS — M67911 Unspecified disorder of synovium and tendon, right shoulder: Secondary | ICD-10-CM | POA: Diagnosis not present

## 2020-05-22 DIAGNOSIS — M67912 Unspecified disorder of synovium and tendon, left shoulder: Secondary | ICD-10-CM | POA: Diagnosis not present

## 2020-05-22 DIAGNOSIS — M19012 Primary osteoarthritis, left shoulder: Secondary | ICD-10-CM | POA: Diagnosis not present

## 2020-05-30 DIAGNOSIS — M25511 Pain in right shoulder: Secondary | ICD-10-CM | POA: Diagnosis not present

## 2020-06-02 ENCOUNTER — Ambulatory Visit: Payer: Medicare Other | Admitting: Family Medicine

## 2020-06-03 ENCOUNTER — Telehealth: Payer: Self-pay | Admitting: Family Medicine

## 2020-06-03 ENCOUNTER — Other Ambulatory Visit: Payer: Self-pay | Admitting: Family Medicine

## 2020-06-03 ENCOUNTER — Other Ambulatory Visit: Payer: Self-pay

## 2020-06-03 ENCOUNTER — Other Ambulatory Visit: Payer: Self-pay | Admitting: Neurology

## 2020-06-03 MED ORDER — ESOMEPRAZOLE MAGNESIUM 40 MG PO CPDR: 40.0000 mg | DELAYED_RELEASE_CAPSULE | Freq: Every day | ORAL | 3 refills | Status: DC

## 2020-06-03 NOTE — Telephone Encounter (Signed)
Sent!

## 2020-06-03 NOTE — Telephone Encounter (Signed)
Copied from CRM [id number]. Topic: Quick Communication - Rx Refill/Question >> Jun 03, 2020 10:37 AM Jennifer Davidson wrote: Medication: famotidine (PEPCID) 40 MG   Has the patient contacted their pharmacy? Yes - pharmacy redirected patient back to PCP. Medication was originally prescribed by Dr. Leretha Pol - patient now sees K. Just FNP  Preferred Pharmacy (with phone number or street name): [id number] - [place], [place] - [address] AT [facility] [address]  Phone: [phone]  Agent: Please be advised that RX refills may take up to 3 business days. We ask that you follow-up with your pharmacy.

## 2020-06-04 ENCOUNTER — Telehealth (HOSPITAL_COMMUNITY): Payer: Self-pay | Admitting: *Deleted

## 2020-06-04 DIAGNOSIS — M75121 Complete rotator cuff tear or rupture of right shoulder, not specified as traumatic: Secondary | ICD-10-CM | POA: Diagnosis not present

## 2020-06-04 DIAGNOSIS — M19212 Secondary osteoarthritis, left shoulder: Secondary | ICD-10-CM | POA: Diagnosis not present

## 2020-06-04 NOTE — Telephone Encounter (Signed)
Patient given detailed instructions per Myocardial Perfusion Study Information Sheet for the test on 06/09/20. Patient notified to arrive 15 minutes early and that it is imperative to arrive on time for appointment to keep from having the test rescheduled.  If you need to cancel or reschedule your appointment, please call the office within 24 hours of your appointment. . Patient verbalized understanding. Jennifer Davidson   

## 2020-06-09 ENCOUNTER — Encounter: Payer: Self-pay | Admitting: Neurology

## 2020-06-09 ENCOUNTER — Encounter: Payer: Self-pay | Admitting: Family Medicine

## 2020-06-09 ENCOUNTER — Other Ambulatory Visit: Payer: Self-pay

## 2020-06-09 ENCOUNTER — Ambulatory Visit (HOSPITAL_COMMUNITY): Payer: Medicare Other | Attending: Cardiology

## 2020-06-09 ENCOUNTER — Ambulatory Visit (HOSPITAL_BASED_OUTPATIENT_CLINIC_OR_DEPARTMENT_OTHER): Payer: Medicare Other

## 2020-06-09 DIAGNOSIS — R0602 Shortness of breath: Secondary | ICD-10-CM | POA: Diagnosis not present

## 2020-06-09 DIAGNOSIS — R072 Precordial pain: Secondary | ICD-10-CM

## 2020-06-09 DIAGNOSIS — I2510 Atherosclerotic heart disease of native coronary artery without angina pectoris: Secondary | ICD-10-CM | POA: Diagnosis not present

## 2020-06-09 DIAGNOSIS — I2583 Coronary atherosclerosis due to lipid rich plaque: Secondary | ICD-10-CM

## 2020-06-09 LAB — MYOCARDIAL PERFUSION IMAGING
LV dias vol: 46 mL (ref 46–106)
LV sys vol: 16 mL
Peak HR: 99 {beats}/min
Rest HR: 84 {beats}/min
SDS: 4
SRS: 0
SSS: 4
TID: 0.9

## 2020-06-09 LAB — ECHOCARDIOGRAM COMPLETE
Area-P 1/2: 3.19 cm2
Height: 63 in
S' Lateral: 2.35 cm
Weight: 3024 oz

## 2020-06-09 MED ORDER — ADENOSINE (DIAGNOSTIC) 3 MG/ML IV SOLN
0.5600 mg/kg | Freq: Once | INTRAVENOUS | Status: AC
Start: 2020-06-09 — End: 2020-06-09
  Administered 2020-06-09: 48 mg via INTRAVENOUS

## 2020-06-09 MED ORDER — TECHNETIUM TC 99M TETROFOSMIN IV KIT
10.1000 | PACK | Freq: Once | INTRAVENOUS | Status: AC | PRN
  Administered 2020-06-09: 10.1 via INTRAVENOUS
  Filled 2020-06-09: qty 11

## 2020-06-09 MED ORDER — TECHNETIUM TC 99M TETROFOSMIN IV KIT
30.5000 | PACK | Freq: Once | INTRAVENOUS | Status: AC | PRN
  Administered 2020-06-09: 30.5 via INTRAVENOUS
  Filled 2020-06-09: qty 31

## 2020-06-09 NOTE — Progress Notes (Signed)
Jennifer Davidson (Key: [id number]) Aimovig 70MG /ML auto-injectors   Form Electronic PA Form Created 23 hours ago Sent to Plan 4 minutes ago Plan Response 3 minutes ago Submit Clinical Questions 2 minutes ago Determination Favorable 1 minute ago Message from Plan PA Case: [employer], Status: Approved, Coverage Starts on: 05/17/2020 12:00:00 AM, Coverage Ends on: 05/16/2021 12:00:00 AM. Questions? Contact [phone].

## 2020-06-10 NOTE — Telephone Encounter (Signed)
Nexium 40 mg 1 time daily in am Pepcid 40 mg 1 time daily in pm  Reports this works well and noticed no side effects but was unsure if you would want her to continue

## 2020-06-10 NOTE — Telephone Encounter (Signed)
Have her Try Tujuana Kilmartin the nexium daily at 40 and we will reevaluate at her next appointment on 2/7 to see if that was enough or if we need to restart the pepcid.

## 2020-06-10 NOTE — Telephone Encounter (Signed)
Pt wants to know if she should continue the Famotidine 40 mg she was given when she left the hospital 04/29/2020.  Apparently was not reported to the office after that encounter and so she would like to know if she should continue and if so she will need a refill.   I have sent her a message requesting her dose directions and pharmacy

## 2020-06-10 NOTE — Telephone Encounter (Signed)
I had remembered I was going to clarify this with her and never did. On her medication list it said Nexium and famotidine. Is she taking both of those and at what doses and time of the day. Thanks!

## 2020-06-12 ENCOUNTER — Other Ambulatory Visit: Payer: Self-pay

## 2020-06-12 DIAGNOSIS — F331 Major depressive disorder, recurrent, moderate: Secondary | ICD-10-CM

## 2020-06-12 MED ORDER — GABAPENTIN 600 MG PO TABS: 600.0000 mg | ORAL_TABLET | Freq: Three times a day (TID) | ORAL | 1 refills | Status: DC

## 2020-06-12 MED ORDER — QUETIAPINE FUMARATE 300 MG PO TABS: ORAL_TABLET | ORAL | 1 refills | Status: DC

## 2020-06-12 MED ORDER — BUSPIRONE HCL 7.5 MG PO TABS: 7.5000 mg | ORAL_TABLET | Freq: Three times a day (TID) | ORAL | 1 refills | Status: DC

## 2020-06-13 ENCOUNTER — Other Ambulatory Visit: Payer: Self-pay | Admitting: Orthopedic Surgery

## 2020-06-13 ENCOUNTER — Telehealth: Payer: Self-pay

## 2020-06-13 DIAGNOSIS — Z01811 Encounter for preprocedural respiratory examination: Secondary | ICD-10-CM

## 2020-06-13 NOTE — Telephone Encounter (Signed)
   [facility] Pre-operative Risk Assessment    HEARTCARE STAFF: - Please ensure there is not already an duplicate clearance open for this procedure. - Under Visit Info/Reason for Call, type in Other and utilize the format Clearance MM/DD/YY or Clearance TBD. Do not use dashes or single digits. - If request is for dental extraction, please clarify the # of teeth to be extracted.  Request for surgical clearance:  1. What type of surgery is being performed? Left Reverse Total Shoulder Arthroplasty    2. When is this surgery scheduled? 07/03/20   3. What type of clearance is required (medical clearance vs. Pharmacy clearance to hold med vs. Both)? Both  4. Are there any medications that need to be held prior to surgery and how long?Aspirin    5. Practice name and name of physician performing surgery? [facility] Dr. [name]   6. What is the office phone number? [phone]   7.   What is the office fax number? [phone] Attn: Marcelo Baldy  8.   Anesthesia type (None, local, MAC, general) ? Choice   Para March 06/13/2020, 3:54 PM  _________________________________________________________________   (provider comments below)

## 2020-06-13 NOTE — Telephone Encounter (Signed)
   Primary Cardiologist: Armanda Magic, MD  Chart reviewed as part of pre-operative protocol coverage. Given past medical history and time since last visit, based on ACC/AHA guidelines, KRITHIKA TOME would be at acceptable risk for the planned procedure without further cardiovascular testing.  Recent echocardiogram and Myoview were normal.  Patient may hold aspirin for 5 to 7 days prior to the surgery and restart as soon as possible after the procedure.  The patient was advised that if she develops new symptoms prior to surgery to contact our office to arrange for a follow-up visit, and she verbalized understanding.  I will route this recommendation to the requesting party via Epic fax function and remove from pre-op pool.  Please call with questions.  [place], [place] 06/13/2020, 4:33 PM

## 2020-06-16 ENCOUNTER — Other Ambulatory Visit: Payer: Self-pay | Admitting: Family Medicine

## 2020-06-16 DIAGNOSIS — Z1231 Encounter for screening mammogram for malignant neoplasm of breast: Secondary | ICD-10-CM

## 2020-06-16 DIAGNOSIS — Z780 Asymptomatic menopausal state: Secondary | ICD-10-CM

## 2020-06-18 ENCOUNTER — Other Ambulatory Visit: Payer: Self-pay

## 2020-06-18 ENCOUNTER — Telehealth: Payer: Medicare Other | Admitting: Cardiology

## 2020-06-23 ENCOUNTER — Encounter: Payer: Self-pay | Admitting: Family Medicine

## 2020-06-23 ENCOUNTER — Other Ambulatory Visit: Payer: Self-pay

## 2020-06-23 ENCOUNTER — Telehealth (INDEPENDENT_AMBULATORY_CARE_PROVIDER_SITE_OTHER): Payer: Medicare Other | Admitting: Family Medicine

## 2020-06-23 VITALS — Ht 63.0 in | Wt 180.0 lb

## 2020-06-23 DIAGNOSIS — M75102 Unspecified rotator cuff tear or rupture of left shoulder, not specified as traumatic: Secondary | ICD-10-CM | POA: Diagnosis not present

## 2020-06-23 DIAGNOSIS — F515 Nightmare disorder: Secondary | ICD-10-CM

## 2020-06-23 DIAGNOSIS — M12812 Other specific arthropathies, not elsewhere classified, left shoulder: Secondary | ICD-10-CM

## 2020-06-23 DIAGNOSIS — H8110 Benign paroxysmal vertigo, unspecified ear: Secondary | ICD-10-CM | POA: Diagnosis not present

## 2020-06-23 DIAGNOSIS — K219 Gastro-esophageal reflux disease without esophagitis: Secondary | ICD-10-CM | POA: Diagnosis not present

## 2020-06-23 MED ORDER — FAMOTIDINE 20 MG PO TABS: 20.0000 mg | ORAL_TABLET | Freq: Every day | ORAL | 3 refills | Status: DC | PRN

## 2020-06-23 NOTE — Progress Notes (Signed)
2/7/20229:42 AM  Jennifer Davidson August 23, 1955, 65 y.o., female [id number]  Chief Complaint  Patient presents with  . Medical Management of Chronic Issues    4 week f/u    HPI:  I connected with patient on 06/23/20 at 0920 by telephone due to unable to work Epic video visit and verified that I am speaking with the correct person using two identifiers. Jennifer Davidson is currently located at home and no family members are currently with them during visit. The provider, Macario Carls Shon Mansouri, FNP is located in their office at time of visit.  I discussed the limitations, risks, security and privacy concerns of performing an evaluation and management service by telephone and the availability of in person appointments. I also discussed with the patient that there may be a patient responsible charge related to this service. The patient expressed understanding and agreed to proceed.   I provided 20 minutes of non-face-to-face time during this encounter.  Patient is a 65 y.o. female with past medical history significant for HTN, HLD, dream disorder, anxiety who presents today for routine follow up.  BP 109/68 Not on any medications for BP  HLD (followed by Cards) Aspirin 81mg  daily Atorvastatin 20mg  daily Vascepa 2 g daily Lab Results  Component Value Date   CHOL 185 02/18/2020   HDL 70 02/18/2020   LDLCALC 89 02/18/2020   TRIG 154 (H) 02/18/2020   CHOLHDL 2.6 02/18/2020     Hypothyroid Levothyroxine alt 40mcg/75mcg Lab Results  Component Value Date   TSH 1.530 02/18/2020      Pre-Diabetes Metformin 500mg  daily Doesn't check BG at home Recent Labs       Lab Results  Component Value Date   HGBA1C 5.7 (H) 04/29/2020     GI Zofran (nausea) Probiotic daily Nexium 40mg  daily (GERD) Had stopped the famotidine Was taking Prilosec Will restart famotidine prn  Asthma/Allergies Albuterol PRN Flonase daily Cetrizine at night   Pain Gabapentin 400 mg daily Cymbalta 60 mg  daily Voltaren gel prn  Anxiety/Depression Will see psychiatry on 2/22 Nightmares seem to be better Went from every night to a couple of times a night Wellbutrin 150 mg daily Buspar 7.5 mg tid Hydroxyzine 50 mg prn Prazosin 10-15 mg qhs (nightmares) Has been taking the 15mg  per night Seroquel 300 mg qhs Prior to illness did have months without   Migraines (managed by Neuro) Aimovig monthly injections Maxalt 10mg  prn  Dizziness Will see ENT 2/24 Takes the meclizine for dizziness Seems to be stable at this time  On 2/17 getting rotator cuff repaired with Dr. 3/22  Dexa: scheduled for June Due for second pneumonia vacccine Health Maintenance  Topic Date Due  . DEXA SCAN  Never done  . PNA vac Low Risk Adult (2 of 2 - PPSV23) 02/19/2020  . TETANUS/TDAP  11/15/2020 (Originally 05/17/2018)  . Hepatitis C Screening  11/15/2020 (Originally 1955-09-13)  . MAMMOGRAM  06/27/2021  . COLONOSCOPY (Pts 45-60yrs Insurance coverage will need to be confirmed)  07/09/2029  . INFLUENZA VACCINE  Completed  . COVID-19 Vaccine  Completed     Depression screen [facility] 2/9 06/23/2020 05/21/2020 05/06/2020  Decreased Interest 0 0 0  Down, Depressed, Hopeless 0 0 0  PHQ - 2 Score 0 0 0  Altered sleeping - - -  Tired, decreased energy - - -  Change in appetite - - -  Feeling bad or failure about yourself  - - -  Trouble concentrating - - -  Moving slowly or fidgety/restless - - -  Suicidal thoughts - - -  PHQ-9 Score - - -  Difficult doing work/chores - - -  Some recent data might be hidden    Fall Risk  06/23/2020 05/21/2020 05/06/2020 03/27/2020 11/16/2019  Falls in the past year? 0 0 0 1 0  Comment - - - - -  Number falls in past yr: 0 0 0 1 0  Injury with Fall? 0 0 0 0 0  Comment - - - - -  Risk Factor Category  - - - - -  Risk for fall due to : - - - - -  Risk for fall due to: Comment - - - - -  Follow up Falls evaluation completed Falls evaluation completed Falls evaluation completed  - Falls evaluation completed     Allergies  Allergen Reactions  . Tramadol Nausea And Vomiting  . Trazodone And Nefazodone Other (See Comments)    Per pt trazodone caused hallucinations and behavior changes     Prior to Admission medications   Medication Sig Start Date End Date Taking? Authorizing Provider  albuterol (VENTOLIN HFA) 108 (90 Base) MCG/ACT inhaler TAKE 2 PUFFS BY MOUTH EVERY 6 HOURS AS NEEDED FOR WHEEZE OR SHORTNESS OF BREATH 05/06/20  Yes Landrum Carbonell, Azalee Course, FNP  aspirin EC 81 MG tablet Take 1 tablet (81 mg total) by mouth daily. 07/04/19  Yes Turner, Cornelious Bryant, MD  atorvastatin (LIPITOR) 20 MG tablet TAKE 1 TABLET BY MOUTH EVERY DAY Patient taking differently: Take 20 mg by mouth daily. 01/29/20  Yes Lezlie Lye, Meda Coffee, MD  buPROPion (WELLBUTRIN XL) 150 MG 24 hr tablet Take 1 tablet (150 mg total) by mouth daily. 02/18/20  Yes Lezlie Lye, Meda Coffee, MD  busPIRone (BUSPAR) 7.5 MG tablet Take 1 tablet (7.5 mg total) by mouth 3 (three) times daily. 02/18/20  Yes Lezlie Lye, Meda Coffee, MD  cetirizine (ZYRTEC) 10 MG tablet Take 1 tablet (10 mg total) by mouth at bedtime. 05/06/20  Yes Claribel Sachs, Azalee Course, FNP  diclofenac sodium (VOLTAREN) 1 % GEL Apply 2 g topically daily as needed (for knee pain). 08/23/18  Yes Lezlie Lye, Meda Coffee, MD  DULoxetine (CYMBALTA) 60 MG capsule TAKE 1 CAPSULE EVERY DAY Patient taking differently: Take 60 mg by mouth daily. 02/06/20  Yes Lezlie Lye, Irma M, MD  Erenumab-aooe (AIMOVIG) 70 MG/ML SOAJ Inject 70 mg into the skin every 28 (twenty-eight) days. 04/08/20  Yes Jaffe, Adam R, DO  esomeprazole (NEXIUM) 40 MG capsule TAKE 1 CAPSULE EVERY DAY Patient taking differently: Take 40 mg by mouth daily. 09/12/19  Yes Lezlie Lye, Irma M, MD  fluticasone (FLONASE) 50 MCG/ACT nasal spray PLACE 2 SPRAYS AT BEDTIME INTO BOTH NOSTRILS. 05/06/20  Yes Nahlia Hellmann, Azalee Course, FNP  gabapentin (NEURONTIN) 400 MG capsule Take 1 capsule (400 mg total) by mouth at bedtime. Patient  taking differently: Take 600 mg by mouth 2 (two) times daily. 02/20/20  Yes Lezlie Lye, Meda Coffee, MD  hydrOXYzine (ATARAX/VISTARIL) 50 MG tablet TAKE 1 TABLET EVERY 6 HOURS AS NEEDED Patient taking differently: Take 50 mg by mouth every 6 (six) hours as needed for anxiety. 06/29/19  Yes Lezlie Lye, Meda Coffee, MD  icosapent Ethyl (VASCEPA) 1 g capsule Take 2 capsules (2 g total) by mouth 2 (two) times daily. 07/31/19  Yes Turner, Cornelious Bryant, MD  levothyroxine (SYNTHROID) 50 MCG tablet TAKE 1 TABLET BY MOUTH EVERY OTHER DAY. BEFORE BREAKFAST, ALTERNATED WITH TABLET. Patient taking differently: Take 50 mcg by mouth every other  day. Before breakfast 11/04/19  Yes Lezlie Lye, Meda Coffee, MD  levothyroxine (SYNTHROID) 75 MCG tablet Take 1 tablet (75 mcg total) by mouth every other day. Before breakfast 05/06/20  Yes Klynn Linnemann, Azalee Course, FNP  meclizine (ANTIVERT) 25 MG tablet Take 25 mg by mouth 3 (three) times daily as needed for dizziness.   Yes [provider]  metFORMIN (GLUCOPHAGE) 500 MG tablet Take 1 tablet (500 mg total) by mouth daily with breakfast. 02/26/20  Yes Lezlie Lye, Meda Coffee, MD  ondansetron (ZOFRAN ODT) 4 MG disintegrating tablet Take 1 tablet (4 mg total) by mouth every 8 (eight) hours as needed for nausea or vomiting. 04/17/20  Yes Jacalyn Lefevre, MD  prazosin (MINIPRESS) 5 MG capsule Take 2-3 capsules (10-15 mg total) by mouth at bedtime. TAKE 2 TO 3 CAPSULES AT BEDTIME 05/06/20  Yes Ermal Brzozowski, Azalee Course, FNP  Probiotic Product (ALIGN) 4 MG CAPS Take 1 capsule (4 mg total) by mouth daily. 04/17/20  Yes Jacalyn Lefevre, MD  QUEtiapine (SEROQUEL) 300 MG tablet TAKE 1 TABLET BY MOUTH EVERYDAY AT BEDTIME Patient taking differently: Take 300 mg by mouth at bedtime. 10/08/19  Yes Lezlie Lye, Meda Coffee, MD  rizatriptan (MAXALT-MLT) 10 MG disintegrating tablet Take 1 tablet (10 mg total) by mouth as needed for migraine. May repeat in 2 hours if needed 02/18/20  Yes Lezlie Lye, Meda Coffee, MD     Past Medical History:  Diagnosis Date  . Acute encephalopathy 06/25/2016  . Anxiety   . Cataract 08/31/2017   left eye  . Cholesterol serum increased   . Chronic back pain    chronic Rt low back pain. s/p L4-5 fusion. failed Rt facet injections. poss due to Rt SI joint dysfunction.  . Chronic kidney disease   . Depression   . Diastolic heart failure (HCC)   . GERD (gastroesophageal reflux disease)   . Hyperlipidemia   . Hyperparathyroidism (HCC)   . Hypertension   . Hypothyroidism   . Migraine   . Neuromuscular disorder (HCC)   . Seizures (HCC)    3 years ago,06/20/19  . Sleep apnea    cpap  . Syncope 08/30/2015  . Tachycardia   . Trochanteric bursitis of both hips 2012   Confirmed on MRI    Past Surgical History:  Procedure Laterality Date  . ABDOMINAL HYSTERECTOMY    . APPENDECTOMY  1986  . BACK SURGERY     L4-5 fusion  . CATARACT EXTRACTION    . KNEE SURGERY    . SHOULDER ARTHROSCOPY WITH SUBACROMIAL DECOMPRESSION Left 10/20/2018   Procedure: SHOULDER ARTHROSCOPY, ROTATOR CUFF DEBRIDEMENT, GLENOHUMERAL JOINT DEBRIDEMENT, ACROMIOPLASTY, DISTAL CLAVICLE RESECTION;  Surgeon: Jodi Geralds, MD;  Location: [facility];  Service: Orthopedics;  Laterality: Left;    Social History   Tobacco Use  . Smoking status: Never Smoker  . Smokeless tobacco: Never Used  Substance Use Topics  . Alcohol use: No    Family History  Problem Relation Age of Onset  . Diabetes Mother   . Heart disease Mother   . Kidney disease Mother   . Thyroid disease Mother   . Heart disease Father   . Seizures Father   . COPD Father   . Irritable bowel syndrome Father   . Cancer Maternal Grandmother   . Diabetes Sister   . Diabetes Brother   . Colon cancer Neg Hx   . Stomach cancer Neg Hx   . Colon polyps Neg Hx   . Esophageal cancer Neg Hx   .  Rectal cancer Neg Hx     Review of Systems  Constitutional: Negative for chills, fever and malaise/fatigue.  HENT: Positive for tinnitus.   Eyes:  Negative for blurred vision and double vision.  Respiratory: Negative for cough, shortness of breath and wheezing.   Cardiovascular: Negative for chest pain, palpitations and leg swelling.  Gastrointestinal: Negative for abdominal pain, blood in stool, constipation, diarrhea, heartburn, nausea and vomiting.  Genitourinary: Negative for dysuria, frequency and hematuria.  Musculoskeletal: Negative for back pain and joint pain.  Skin: Negative for rash.  Neurological: Positive for dizziness. Negative for weakness and headaches.  Psychiatric/Behavioral: Positive for depression. Negative for suicidal ideas (no current, yes in the past). The patient is nervous/anxious and has insomnia.        Nightmares     OBJECTIVE:  Today's Vitals   06/23/20 0819  Weight: 180 lb (81.6 kg)  Height: 5\' 3"  (1.6 m)   Body mass index is 31.89 kg/m.  OBJECTIVE Constitutional:      General: Not in acute distress.    Appearance: Normal appearance. Not ill-appearing.   Pulmonary:     Effort: Pulmonary effort is normal. No respiratory distress.  Neurological:     Mental Status: Alert and oriented to person, place, and time.  Psychiatric:        Mood and Affect: Mood normal.        Behavior: Behavior normal.    No results found for this or any previous visit (from the past 24 hour(s)).  No results found.   ASSESSMENT and PLAN  Problem List Items Addressed This Visit      Digestive   GERD (gastroesophageal reflux disease) - Primary   Relevant Medications   famotidine (PEPCID) 20 MG tablet     Nervous and Auditory   Dream anxiety disorder     Musculoskeletal and Integument   Rotator cuff tear arthropathy of left shoulder (Chronic)    Other Visit Diagnoses    Benign paroxysmal positional vertigo, unspecified laterality         Plan:  Continue daily nexium, use pepcid prn  Chronic conditions stable under current regiments  Has appointments scheduled with psychiatry and ENT  Has  surgery scheduled for rotator cuff repair   Return in about 6 weeks (around 08/04/2020).    The above assessment and management plan was discussed with the patient. The patient verbalized understanding of and has agreed to the management plan. Patient is aware to call the clinic if symptoms persist or worsen. Patient is aware when to return to the clinic for a follow-up visit. Patient educated on when it is appropriate to go to the emergency department.  Macario Carls Zay Yeargan, FNP-BC Primary [facility] at [facility] [address] [place], [place] [address] Ph.  [phone] Fax [phone]

## 2020-06-23 NOTE — Patient Instructions (Signed)
Health Maintenance After Age 67 After age 67, you are at a higher risk for certain long-term diseases and infections as well as injuries from falls. Falls are a major cause of broken bones and head injuries in people who are older than age 67. Getting regular preventive care can help to keep you healthy and well. Preventive care includes getting regular testing and making lifestyle changes as recommended by your health care provider. Talk with your health care provider about:  Which screenings and tests you should have. A screening is a test that checks for a disease when you have no symptoms.  A diet and exercise plan that is right for you. What should I know about screenings and tests to prevent falls? Screening and testing are the best ways to find a health problem early. Early diagnosis and treatment give you the best chance of managing medical conditions that are common after age 67. Certain conditions and lifestyle choices may make you more likely to have a fall. Your health care provider may recommend:  Regular vision checks. Poor vision and conditions such as cataracts can make you more likely to have a fall. If you wear glasses, make sure to get your prescription updated if your vision changes.  Medicine review. Work with your health care provider to regularly review all of the medicines you are taking, including over-the-counter medicines. Ask your health care provider about any side effects that may make you more likely to have a fall. Tell your health care provider if any medicines that you take make you feel dizzy or sleepy.  Osteoporosis screening. Osteoporosis is a condition that causes the bones to get weaker. This can make the bones weak and cause them to break more easily.  Blood pressure screening. Blood pressure changes and medicines to control blood pressure can make you feel dizzy.  Strength and balance checks. Your health care provider may recommend certain tests to check your  strength and balance while standing, walking, or changing positions.  Foot health exam. Foot pain and numbness, as well as not wearing proper footwear, can make you more likely to have a fall.  Depression screening. You may be more likely to have a fall if you have a fear of falling, feel emotionally low, or feel unable to do activities that you used to do.  Alcohol use screening. Using too much alcohol can affect your balance and may make you more likely to have a fall. What actions can I take to lower my risk of falls? General instructions  Talk with your health care provider about your risks for falling. Tell your health care provider if: ? You fall. Be sure to tell your health care provider about all falls, even ones that seem minor. ? You feel dizzy, sleepy, or off-balance.  Take over-the-counter and prescription medicines only as told by your health care provider. These include any supplements.  Eat a healthy diet and maintain a healthy weight. A healthy diet includes low-fat dairy products, low-fat (lean) meats, and fiber from whole grains, beans, and lots of fruits and vegetables. Home safety  Remove any tripping hazards, such as rugs, cords, and clutter.  Install safety equipment such as grab bars in bathrooms and safety rails on stairs.  Keep rooms and walkways well-lit. Activity  Follow a regular exercise program to stay fit. This will help you maintain your balance. Ask your health care provider what types of exercise are appropriate for you.  If you need a cane or walker,   use it as recommended by your health care provider.  Wear supportive shoes that have nonskid soles.   Lifestyle  Do not drink alcohol if your health care provider tells you not to drink.  If you drink alcohol, limit how much you have: ? 0-1 drink a day for women. ? 0-2 drinks a day for men.  Be aware of how much alcohol is in your drink. In the [place] one drink equals one typical bottle of beer (12  oz), one-half glass of wine (5 oz), or one shot of hard liquor (1 oz).  Do not use any products that contain nicotine or tobacco, such as cigarettes and e-cigarettes. If you need help quitting, ask your health care provider. Summary  Having a healthy lifestyle and getting preventive care can help to protect your health and wellness after age 67.  Screening and testing are the best way to find a health problem early and help you avoid having a fall. Early diagnosis and treatment give you the best chance for managing medical conditions that are more common for people who are older than age 67.  Falls are a major cause of broken bones and head injuries in people who are older than age 67. Take precautions to prevent a fall at home.  Work with your health care provider to learn what changes you can make to improve your health and wellness and to prevent falls. This information is not intended to replace advice given to you by your health care provider. Make sure you discuss any questions you have with your health care provider. Document Revised: 08/24/2018 Document Reviewed: 03/16/2017 Elsevier Patient Education  2021 [employer]  

## 2020-06-24 ENCOUNTER — Telehealth: Payer: Medicare Other | Admitting: Cardiology

## 2020-06-25 ENCOUNTER — Other Ambulatory Visit: Payer: Self-pay | Admitting: Family Medicine

## 2020-06-25 DIAGNOSIS — R062 Wheezing: Secondary | ICD-10-CM

## 2020-06-25 NOTE — Patient Instructions (Signed)
DUE TO COVID-19 ONLY ONE VISITOR IS ALLOWED TO COME WITH YOU AND STAY IN THE WAITING ROOM ONLY DURING PRE OP AND PROCEDURE DAY OF SURGERY. THE 1 VISITOR  MAY VISIT WITH YOU AFTER SURGERY IN YOUR PRIVATE ROOM DURING VISITING HOURS ONLY!  YOU NEED TO HAVE A COVID 19 TEST ON: 06/30/20 @ 10:00 AM, THIS TEST MUST BE DONE BEFORE SURGERY,  COVID TESTING SITE [address] [place]  [id number], IT IS ON THE RIGHT GOING OUT [address] APPROXIMATELY  2 MINUTES PAST [employer] ON THE RIGHT. ONCE YOUR COVID TEST IS COMPLETED,  PLEASE BEGIN THE QUARANTINE INSTRUCTIONS AS OUTLINED IN YOUR HANDOUT.                Jennifer Davidson    Your procedure is scheduled on: 07/03/20   Report to [facility] Main  Entrance   Report to short stay at: 5:30 AM    Call this number if you have problems the morning of surgery [phone]    Remember:  NO SOLID FOOD AFTER MIDNIGHT THE NIGHT PRIOR TO SURGERY. NOTHING BY MOUTH EXCEPT CLEAR LIQUIDS UNTIL: 4:30 AM .   CLEAR LIQUID DIET   Foods Allowed                                                                     Foods Excluded  Coffee and tea, regular and decaf                             liquids that you cannot  Plain Jell-O any favor except red or purple                                           see through such as: Fruit ices (not with fruit pulp)                                     milk, soups, orange juice  Iced Popsicles                                    All solid food Carbonated beverages, regular and diet                                    Cranberry, grape and apple juices Sports drinks like Gatorade Lightly seasoned clear broth or consume(fat free) Sugar, honey syrup  Sample Menu Breakfast                                Lunch                                     Supper Cranberry juice  Beef broth                            Chicken broth Jell-O                                     Grape juice                            Apple juice Coffee or tea                        Jell-O                                      Popsicle                                                Coffee or tea                        Coffee or tea  _____________________________________________________________________   BRUSH YOUR TEETH MORNING OF SURGERY AND RINSE YOUR MOUTH OUT, NO CHEWING GUM CANDY OR MINTS.    Take these medicines the morning of surgery with A SIP OF WATER: bupropion,buspar,cetirizine,duloxetine,esomeprazole,synthroid,famotidine.Use inhalers and flonase as usual.  How to Manage Your Diabetes Before and After Surgery  Why is it important to control my blood sugar before and after surgery? . Improving blood sugar levels before and after surgery helps healing and can limit problems. . A way of improving blood sugar control is eating a healthy diet by: o  Eating less sugar and carbohydrates o  Increasing activity/exercise o  Talking with your doctor about reaching your blood sugar goals . High blood sugars (greater than 180 mg/dL) can raise your risk of infections and slow your recovery, so you will need to focus on controlling your diabetes during the weeks before surgery. . Make sure that the doctor who takes care of your diabetes knows about your planned surgery including the date and location.  How do I manage my blood sugar before surgery? . Check your blood sugar at least 4 times a day, starting 2 days before surgery, to make sure that the level is not too high or low. o Check your blood sugar the morning of your surgery when you wake up and every 2 hours until you get to the Short Stay unit. . If your blood sugar is less than 70 mg/dL, you will need to treat for low blood sugar: o Do not take insulin. o Treat a low blood sugar (less than 70 mg/dL) with  cup of clear juice (cranberry or apple), 4 glucose tablets, OR glucose gel. o Recheck blood sugar in 15 minutes after treatment (to make sure it is  greater than 70 mg/dL). If your blood sugar is not greater than 70 mg/dL on recheck, call [phone] for further instructions. . Report your blood sugar to the short stay nurse when you get to Short Stay.  . If you are admitted to the hospital after surgery: o Your blood sugar will be checked by the staff and  you will probably be given insulin after surgery (instead of oral diabetes medicines) to make sure you have good blood sugar levels. o The goal for blood sugar control after surgery is 80-180 mg/dL.   WHAT DO I DO ABOUT MY DIABETES MEDICATION?  Marland Kitchen Do not take oral diabetes medicines (pills) the morning of surgery.  . THE DAY BEFORE SURGERY, take Metformin as usual.     . THE MORNING OF SURGERY, DO NOT TAKE ANY DIABETIC MEDICATIONS DAY OF YOUR SURGERY                               You may not have any metal on your body including hair pins and              piercings  Do not wear jewelry, make-up, lotions, powders or perfumes, deodorant             Do not wear nail polish on your fingernails.  Do not shave  48 hours prior to surgery.    Do not bring valuables to the hospital. [facility] IS NOT             RESPONSIBLE   FOR VALUABLES.  Contacts, dentures or bridgework may not be worn into surgery.  Leave suitcase in the car. After surgery it may be brought to your room.     Patients discharged the day of surgery will not be allowed to drive home. IF YOU ARE HAVING SURGERY AND GOING HOME THE SAME DAY, YOU MUST HAVE AN ADULT TO DRIVE YOU HOME AND BE WITH YOU FOR 24 HOURS. YOU MAY GO HOME BY TAXI OR UBER OR ORTHERWISE, BUT AN ADULT MUST ACCOMPANY YOU HOME AND STAY WITH YOU FOR 24 HOURS.  Name and phone number of your driver:  Special Instructions: N/A              Please read over the following fact sheets you were given: _____________________________________________________________________          [facility] - Preparing for Surgery Before surgery, you can play an important role.   Because skin is not sterile, your skin needs to be as free of germs as possible.  You can reduce the number of germs on your skin by washing with CHG (chlorahexidine gluconate) soap before surgery.  CHG is an antiseptic cleaner which kills germs and bonds with the skin to continue killing germs even after washing. Please DO NOT use if you have an allergy to CHG or antibacterial soaps.  If your skin becomes reddened/irritated stop using the CHG and inform your nurse when you arrive at Short Stay. Do not shave (including legs and underarms) for at least 48 hours prior to the first CHG shower.  You may shave your face/neck. Please follow these instructions carefully:  1.  Shower with CHG Soap the night before surgery and the  morning of Surgery.  2.  If you choose to wash your hair, wash your hair first as usual with your  normal  shampoo.  3.  After you shampoo, rinse your hair and body thoroughly to remove the  shampoo.                           4.  Use CHG as you would any other liquid soap.  You can apply chg directly  to the skin and wash  Gently with a scrungie or clean washcloth.  5.  Apply the CHG Soap to your body ONLY FROM THE NECK DOWN.   Do not use on face/ open                           Wound or open sores. Avoid contact with eyes, ears mouth and genitals (private parts).                       Wash face,  Genitals (private parts) with your normal soap.             6.  Wash thoroughly, paying special attention to the area where your surgery  will be performed.  7.  Thoroughly rinse your body with warm water from the neck down.  8.  DO NOT shower/wash with your normal soap after using and rinsing off  the CHG Soap.                9.  Pat yourself dry with a clean towel.            10.  Wear clean pajamas.            11.  Place clean sheets on your bed the night of your first shower and do not  sleep with pets. Day of Surgery : Do not apply any lotions/deodorants the  morning of surgery.  Please wear clean clothes to the hospital/surgery center.  FAILURE TO FOLLOW THESE INSTRUCTIONS MAY RESULT IN THE CANCELLATION OF YOUR SURGERY PATIENT SIGNATURE_________________________________  NURSE SIGNATURE__________________________________  ________________________________________________________________________ [facility]- Preparing for Total Shoulder Arthroplasty    Before surgery, you can play an important role. Because skin is not sterile, your skin needs to be as free of germs as possible. You can reduce the number of germs on your skin by using the following products. . Benzoyl Peroxide Gel o Reduces the number of germs present on the skin o Applied twice a day to shoulder area starting two days before surgery    ==================================================================  Please follow these instructions carefully:  BENZOYL PEROXIDE 5% GEL  Please do not use if you have an allergy to benzoyl peroxide.   If your skin becomes reddened/irritated stop using the benzoyl peroxide.  Starting two days before surgery, apply as follows: 1. Apply benzoyl peroxide in the morning and at night. Apply after taking a shower. If you are not taking a shower clean entire shoulder front, back, and side along with the armpit with a clean wet washcloth.  2. Place a quarter-sized dollop on your shoulder and rub in thoroughly, making sure to cover the front, back, and side of your shoulder, along with the armpit.   2 days before ____ AM   ____ PM              1 day before ____ AM   ____ PM                         3. Do this twice a day for two days.  (Last application is the night before surgery, AFTER using the CHG soap as described below).  4. Do NOT apply benzoyl peroxide gel on the day of surgery.  Incentive Spirometer  An incentive spirometer is a tool that can help keep your lungs clear and active. This tool measures how well you are filling your lungs with  each breath.  Taking long deep breaths may help reverse or decrease the chance of developing breathing (pulmonary) problems (especially infection) following:  A long period of time when you are unable to move or be active. BEFORE THE PROCEDURE   If the spirometer includes an indicator to show your best effort, your nurse or respiratory therapist will set it to a desired goal.  If possible, sit up straight or lean slightly forward. Try not to slouch.  Hold the incentive spirometer in an upright position. INSTRUCTIONS FOR USE  1. Sit on the edge of your bed if possible, or sit up as far as you can in bed or on a chair. 2. Hold the incentive spirometer in an upright position. 3. Breathe out normally. 4. Place the mouthpiece in your mouth and seal your lips tightly around it. 5. Breathe in slowly and as deeply as possible, raising the piston or the ball toward the top of the column. 6. Hold your breath for 3-5 seconds or for as long as possible. Allow the piston or ball to fall to the bottom of the column. 7. Remove the mouthpiece from your mouth and breathe out normally. 8. Rest for a few seconds and repeat Steps 1 through 7 at least 10 times every 1-2 hours when you are awake. Take your time and take a few normal breaths between deep breaths. 9. The spirometer may include an indicator to show your best effort. Use the indicator as a goal to work toward during each repetition. 10. After each set of 10 deep breaths, practice coughing to be sure your lungs are clear. If you have an incision (the cut made at the time of surgery), support your incision when coughing by placing a pillow or rolled up towels firmly against it. Once you are able to get out of bed, walk around indoors and cough well. You may stop using the incentive spirometer when instructed by your caregiver.  RISKS AND COMPLICATIONS  Take your time so you do not get dizzy or light-headed.  If you are in pain, you may need to take or  ask for pain medication before doing incentive spirometry. It is harder to take a deep breath if you are having pain. AFTER USE  Rest and breathe slowly and easily.  It can be helpful to keep track of a log of your progress. Your caregiver can provide you with a simple table to help with this. If you are using the spirometer at home, follow these instructions: SEEK MEDICAL CARE IF:   You are having difficultly using the spirometer.  You have trouble using the spirometer as often as instructed.  Your pain medication is not giving enough relief while using the spirometer.  You develop fever of 100.5 F (38.1 C) or higher. SEEK IMMEDIATE MEDICAL CARE IF:   You cough up bloody sputum that had not been present before.  You develop fever of 102 F (38.9 C) or greater.  You develop worsening pain at or near the incision site. MAKE SURE YOU:   Understand these instructions.  Will watch your condition.  Will get help right away if you are not doing well or get worse. Document Released: 09/13/2006 Document Revised: 07/26/2011 Document Reviewed: 11/14/2006 [facility] Patient Information 2014 [place], [place].   ________________________________________________________________________

## 2020-06-25 NOTE — Telephone Encounter (Signed)
Requested Prescriptions  Pending Prescriptions Disp Refills  . albuterol (VENTOLIN HFA) 108 (90 Base) MCG/ACT inhaler [Pharmacy Med Name: VENTOLIN HFA 90 MCG INHALER] 18 each 1    Sig: TAKE 2 PUFFS BY MOUTH EVERY 6 HOURS AS NEEDED FOR WHEEZE OR SHORTNESS OF BREATH     Pulmonology:  Beta Agonists Failed - 06/25/2020  1:39 AM      Failed - One inhaler should last at least one month. If the patient is requesting refills earlier, contact the patient to check for uncontrolled symptoms.      Passed - Valid encounter within last 12 months    Recent Outpatient Visits          2 days ago Gastroesophageal reflux disease without esophagitis   [facility] [place] [facility], Azalee Course, FNP   1 month ago Benign paroxysmal positional vertigo, unspecified laterality   [facility] [place] [facility], Azalee Course, FNP   1 month ago Insomnia due to other mental disorder   [facility] [place] [facility], Azalee Course, FNP   4 months ago Migraine with aura and without status migrainosus, not intractable   [facility] at [facility], Meda Coffee, MD   7 months ago Insomnia due to other mental disorder   [facility] at [facility], Meda Coffee, MD      Future Appointments            Tomorrow Quintella Reichert, MD [facility]   In 1 month Just, Azalee Course, FNP [facility] at [place], [facility]

## 2020-06-26 ENCOUNTER — Encounter: Payer: Self-pay | Admitting: Cardiology

## 2020-06-26 ENCOUNTER — Telehealth (INDEPENDENT_AMBULATORY_CARE_PROVIDER_SITE_OTHER): Payer: Medicare Other | Admitting: Cardiology

## 2020-06-26 ENCOUNTER — Other Ambulatory Visit: Payer: Self-pay

## 2020-06-26 ENCOUNTER — Encounter (HOSPITAL_COMMUNITY)
Admission: RE | Admit: 2020-06-26 | Discharge: 2020-06-26 | Disposition: A | Discharge status: Home / Self Care | Payer: Medicare Other | Source: Ambulatory Visit | Attending: Orthopedic Surgery | Admitting: Orthopedic Surgery

## 2020-06-26 ENCOUNTER — Encounter (HOSPITAL_COMMUNITY): Payer: Self-pay

## 2020-06-26 VITALS — BP 109/68 | Ht 63.0 in | Wt 180.0 lb

## 2020-06-26 DIAGNOSIS — M12811 Other specific arthropathies, not elsewhere classified, right shoulder: Secondary | ICD-10-CM | POA: Insufficient documentation

## 2020-06-26 DIAGNOSIS — R42 Dizziness and giddiness: Secondary | ICD-10-CM | POA: Diagnosis not present

## 2020-06-26 DIAGNOSIS — Z01812 Encounter for preprocedural laboratory examination: Secondary | ICD-10-CM | POA: Diagnosis not present

## 2020-06-26 DIAGNOSIS — Z7982 Long term (current) use of aspirin: Secondary | ICD-10-CM | POA: Insufficient documentation

## 2020-06-26 DIAGNOSIS — I2583 Coronary atherosclerosis due to lipid rich plaque: Secondary | ICD-10-CM

## 2020-06-26 DIAGNOSIS — R0602 Shortness of breath: Secondary | ICD-10-CM | POA: Diagnosis not present

## 2020-06-26 DIAGNOSIS — E782 Mixed hyperlipidemia: Secondary | ICD-10-CM | POA: Diagnosis not present

## 2020-06-26 DIAGNOSIS — I2510 Atherosclerotic heart disease of native coronary artery without angina pectoris: Secondary | ICD-10-CM

## 2020-06-26 DIAGNOSIS — E669 Obesity, unspecified: Secondary | ICD-10-CM

## 2020-06-26 DIAGNOSIS — Z79899 Other long term (current) drug therapy: Secondary | ICD-10-CM | POA: Diagnosis not present

## 2020-06-26 DIAGNOSIS — I313 Pericardial effusion (noninflammatory): Secondary | ICD-10-CM

## 2020-06-26 DIAGNOSIS — I10 Essential (primary) hypertension: Secondary | ICD-10-CM | POA: Diagnosis not present

## 2020-06-26 DIAGNOSIS — I130 Hypertensive heart and chronic kidney disease with heart failure and stage 1 through stage 4 chronic kidney disease, or unspecified chronic kidney disease: Secondary | ICD-10-CM | POA: Diagnosis not present

## 2020-06-26 DIAGNOSIS — N189 Chronic kidney disease, unspecified: Secondary | ICD-10-CM | POA: Insufficient documentation

## 2020-06-26 DIAGNOSIS — I3139 Other pericardial effusion (noninflammatory): Secondary | ICD-10-CM

## 2020-06-26 DIAGNOSIS — K219 Gastro-esophageal reflux disease without esophagitis: Secondary | ICD-10-CM | POA: Insufficient documentation

## 2020-06-26 DIAGNOSIS — I509 Heart failure, unspecified: Secondary | ICD-10-CM | POA: Diagnosis not present

## 2020-06-26 HISTORY — DX: Other specified postprocedural states: Z98.890

## 2020-06-26 HISTORY — DX: Heart failure, unspecified: I50.9

## 2020-06-26 HISTORY — DX: Prediabetes: R73.03

## 2020-06-26 HISTORY — DX: Nausea with vomiting, unspecified: R11.2

## 2020-06-26 HISTORY — DX: Unspecified osteoarthritis, unspecified site: M19.90

## 2020-06-26 HISTORY — DX: Other complications of anesthesia, initial encounter: T88.59XA

## 2020-06-26 LAB — CBC WITH DIFFERENTIAL/PLATELET
Abs Immature Granulocytes: 0.07 10*3/uL (ref 0.00–0.07)
Basophils Absolute: 0.1 10*3/uL (ref 0.0–0.1)
Basophils Relative: 1 %
Eosinophils Absolute: 0.3 10*3/uL (ref 0.0–0.5)
Eosinophils Relative: 3 %
HCT: 41.4 % (ref 36.0–46.0)
Hemoglobin: 13.1 g/dL (ref 12.0–15.0)
Immature Granulocytes: 1 %
Lymphocytes Relative: 33 %
Lymphs Abs: 2.9 10*3/uL (ref 0.7–4.0)
MCH: 26.8 pg (ref 26.0–34.0)
MCHC: 31.6 g/dL (ref 30.0–36.0)
MCV: 84.8 fL (ref 80.0–100.0)
Monocytes Absolute: 0.6 10*3/uL (ref 0.1–1.0)
Monocytes Relative: 7 %
Neutro Abs: 4.7 10*3/uL (ref 1.7–7.7)
Neutrophils Relative %: 55 %
Platelets: 261 10*3/uL (ref 150–400)
RBC: 4.88 MIL/uL (ref 3.87–5.11)
RDW: 17.3 % — ABNORMAL HIGH (ref 11.5–15.5)
WBC: 8.6 10*3/uL (ref 4.0–10.5)
nRBC: 0 % (ref 0.0–0.2)

## 2020-06-26 LAB — COMPREHENSIVE METABOLIC PANEL
ALT: 20 U/L (ref 0–44)
AST: 24 U/L (ref 15–41)
Albumin: 4.2 g/dL (ref 3.5–5.0)
Alkaline Phosphatase: 67 U/L (ref 38–126)
Anion gap: 13 (ref 5–15)
BUN: 17 mg/dL (ref 8–23)
CO2: 24 mmol/L (ref 22–32)
Calcium: 9.8 mg/dL (ref 8.9–10.3)
Chloride: 102 mmol/L (ref 98–111)
Creatinine, Ser: 1.47 mg/dL — ABNORMAL HIGH (ref 0.44–1.00)
GFR, Estimated: 39 mL/min — ABNORMAL LOW (ref >60)
Glucose, Bld: 105 mg/dL — ABNORMAL HIGH (ref 70–99)
Potassium: 4.4 mmol/L (ref 3.5–5.1)
Sodium: 139 mmol/L (ref 135–145)
Total Bilirubin: 0.6 mg/dL (ref 0.3–1.2)
Total Protein: 7.5 g/dL (ref 6.5–8.1)

## 2020-06-26 LAB — URINALYSIS, ROUTINE W REFLEX MICROSCOPIC
Bilirubin Urine: NEGATIVE
Glucose, UA: NEGATIVE mg/dL
Hgb urine dipstick: NEGATIVE
Ketones, ur: NEGATIVE mg/dL
Nitrite: NEGATIVE
Protein, ur: NEGATIVE mg/dL
Specific Gravity, Urine: 1.01 (ref 1.005–1.030)
pH: 5 (ref 5.0–8.0)

## 2020-06-26 LAB — APTT: aPTT: 30 seconds (ref 24–36)

## 2020-06-26 LAB — SURGICAL PCR SCREEN
MRSA, PCR: NEGATIVE
Staphylococcus aureus: NEGATIVE

## 2020-06-26 NOTE — Patient Instructions (Signed)

## 2020-06-26 NOTE — Progress Notes (Signed)
COVID Vaccine Completed: Yes Date COVID Vaccine completed: 12/21. [place] COVID vaccine manufacturer:  Moderna    PCP - Macario Carls Just FNP Cardiologist - Armanda Magic. Clearance: Azalee Course: PAC: 06/13/20  Chest x-ray -04/29/20  EKG - 05/20/20 Stress Test -  ECHO - 06/09/20 Cardiac Cath -  Pacemaker/ICD device last checked:  Sleep Study - Yes CPAP - Yes  Fasting Blood Sugar -  Checks Blood Sugar _____ times a day  Blood Thinner Instructions: Aspirin Instructions: Last Dose:  Anesthesia review: Hx: HF,OSA(CPAP)Tachycardia,seizures(last six years ago),Pre-DIA.  Patient denies shortness of breath, fever, cough and chest pain at PAT appointment   Patient verbalized understanding of instructions that were given to them at the PAT appointment. Patient was also instructed that they will need to review over the PAT instructions again at home before surgery.

## 2020-06-26 NOTE — Progress Notes (Signed)
Virtual Visit via Video Note   This visit type was conducted due to national recommendations for restrictions regarding the COVID-19 Pandemic (e.g. social distancing) in an effort to limit this patient's exposure and mitigate transmission in our community.  Due to her co-morbid illnesses, this patient is at least at moderate risk for complications without adequate follow up.  This format is felt to be most appropriate for this patient at this time.  All issues noted in this document were discussed and addressed.  A limited physical exam was performed with this format.  Please refer to the patient's chart for her consent to telehealth for [facility].       Date:  06/26/2020   ID:  Jennifer Davidson, DOB November 18, 1955, MRN [id number] The patient was identified using 2 identifiers.  Patient Location: Home Provider Location: Home Office  PCP:  Just, Azalee Course, FNP  Cardiologist:  Armanda Magic, MD  Electrophysiologist:  None   Evaluation Performed:  Follow-Up Visit  Chief Complaint:  dizziness  History of Present Illness:    Jennifer Davidson is a 65 y.o. female with a hx of GERD, HTN, hypothyroidism, OSA on PAP therapy and diastolic CHF.  She also has a hx of dizziness and syncope.  She had a syncopal spell about 2 years ago when standing out in the driveway talking to her husband and was evaluated in the ER and workup was normal.  2D echo was normal at that time but she broke 6 ribs.  That time she felt lightheaded and felt like she was going to pass out.    At her next OV she complained of episodes of dizziness that were different from her event in 2017.  She also had had episodes sitting down reading where suddenly room starts to spin and she gets very dizzy.  She fell again 12/27 while walking across her living room floor and she lost control and fell into an ottoman.  She had no lightheadedness and did not feel like she was going to pass out.  2D echo 05/2019 showed normal LVF and G2DD. Event  monitor showed PACs and PVCs.  Coronary CTA 06/2019 showed mild CAD of the mid LAD 25-49% and mild PE.    She was hospitalized in Dec with lower abdominal pain, fever  And N/V and was dx with dehydration and UTI with E Coli and treated with antibx and IVF.  She complained of some atypical CP with chest wall tenderness and EKG was normal and hsTrop neg x 2. The pain was sharp and fleeting and exertional and nonexertional and with DOE  but no diaphoresis.  She underwent Lexiscan myoview 06/09/2020 that was normal with no ischemia.    She is here today for followup and is doing well.  She occasionally has some sharp pain at times in her chest that she thinks may be coming from her shoulder.  She is getting ready to get a shoulder replacement surgery done in the next few weeks.  She is still  having vertigo and is going to ENT in a few weeks as well.  She denies any exertional chest pain or pressure, SOB, DOE, PND, orthopnea, LE edema,  palpitations or syncope. She is compliant with her meds and is tolerating meds with no SE.    The patient does not have symptoms concerning for COVID-19 infection (fever, chills, cough, or new shortness of breath).    Past Medical History:  Diagnosis Date  . Acute encephalopathy 06/25/2016  .  Anxiety   . Cataract 08/31/2017   left eye  . Cholesterol serum increased   . Chronic back pain    chronic Rt low back pain. s/p L4-5 fusion. failed Rt facet injections. poss due to Rt SI joint dysfunction.  . Chronic kidney disease   . Depression   . Diastolic heart failure (HCC)   . GERD (gastroesophageal reflux disease)   . Hyperlipidemia   . Hyperparathyroidism (HCC)   . Hypertension   . Hypothyroidism   . Migraine   . Neuromuscular disorder (HCC)   . Seizures (HCC)    3 years ago,06/20/19  . Sleep apnea    cpap  . Syncope 08/30/2015  . Tachycardia   . Trochanteric bursitis of both hips 2012   Confirmed on MRI   Past Surgical History:  Procedure Laterality Date  .  ABDOMINAL HYSTERECTOMY    . APPENDECTOMY  1986  . BACK SURGERY     L4-5 fusion  . CATARACT EXTRACTION    . KNEE SURGERY    . SHOULDER ARTHROSCOPY WITH SUBACROMIAL DECOMPRESSION Left 10/20/2018   Procedure: SHOULDER ARTHROSCOPY, ROTATOR CUFF DEBRIDEMENT, GLENOHUMERAL JOINT DEBRIDEMENT, ACROMIOPLASTY, DISTAL CLAVICLE RESECTION;  Surgeon: Jodi GeraldsGraves, John, MD;  Location: [facility];  Service: Orthopedics;  Laterality: Left;     Current Meds  Medication Sig  . AIMOVIG 70 MG/ML SOAJ INJECT 70 MG INTO THE SKIN EVERY 28 (TWENTY-EIGHT) DAYS.  Marland Kitchen. albuterol (VENTOLIN HFA) 108 (90 Base) MCG/ACT inhaler TAKE 2 PUFFS BY MOUTH EVERY 6 HOURS AS NEEDED FOR WHEEZE OR SHORTNESS OF BREATH  . aspirin EC 81 MG tablet Take 1 tablet (81 mg total) by mouth daily.  Marland Kitchen. atorvastatin (LIPITOR) 20 MG tablet TAKE 1 TABLET BY MOUTH EVERY DAY (Patient taking differently: Take 20 mg by mouth daily.)  . buPROPion (WELLBUTRIN XL) 150 MG 24 hr tablet Take 1 tablet (150 mg total) by mouth daily.  . busPIRone (BUSPAR) 7.5 MG tablet Take 1 tablet (7.5 mg total) by mouth 3 (three) times daily.  . cetirizine (ZYRTEC) 10 MG tablet Take 1 tablet (10 mg total) by mouth at bedtime.  . diclofenac sodium (VOLTAREN) 1 % GEL Apply 2 g topically daily as needed (for knee pain).  . DULoxetine (CYMBALTA) 60 MG capsule TAKE 1 CAPSULE EVERY DAY (Patient taking differently: Take 60 mg by mouth daily.)  . esomeprazole (NEXIUM) 40 MG capsule Take 1 capsule (40 mg total) by mouth daily.  . famotidine (PEPCID) 20 MG tablet Take 1 tablet (20 mg total) by mouth daily as needed for heartburn or indigestion.  . fluticasone (FLONASE) 50 MCG/ACT nasal spray PLACE 2 SPRAYS AT BEDTIME INTO BOTH NOSTRILS. (Patient taking differently: Place 2 sprays into both nostrils at bedtime. PLACE 2 SPRAYS AT BEDTIME INTO BOTH NOSTRILS.)  . gabapentin (NEURONTIN) 600 MG tablet Take 1 tablet (600 mg total) by mouth 3 (three) times daily. (Patient taking differently: Take 900 mg by  mouth at bedtime.)  . hydrOXYzine (ATARAX/VISTARIL) 50 MG tablet TAKE 1 TABLET EVERY 6 HOURS AS NEEDED (Patient taking differently: Take 50 mg by mouth every 6 (six) hours as needed for anxiety.)  . icosapent Ethyl (VASCEPA) 1 g capsule Take 2 capsules (2 g total) by mouth 2 (two) times daily.  Marland Kitchen. levothyroxine (SYNTHROID) 50 MCG tablet TAKE 1 TABLET BY MOUTH EVERY OTHER DAY. BEFORE BREAKFAST, ALTERNATED WITH 75MCG TABLET. (Patient taking differently: Take 50 mcg by mouth every other day. Before breakfast)  . levothyroxine (SYNTHROID) 75 MCG tablet Take 1 tablet (75 mcg total)  by mouth every other day. Before breakfast  . meclizine (ANTIVERT) 50 MG tablet Take 0.5 tablets (25 mg total) by mouth 2 (two) times daily as needed for dizziness.  . metFORMIN (GLUCOPHAGE) 500 MG tablet Take 1 tablet (500 mg total) by mouth daily with breakfast.  . Multiple Vitamins-Minerals (MULTIVITAMIN WITH MINERALS) tablet Take 1 tablet by mouth daily.  . ondansetron (ZOFRAN ODT) 4 MG disintegrating tablet Take 1 tablet (4 mg total) by mouth every 8 (eight) hours as needed for nausea or vomiting.  . prazosin (MINIPRESS) 5 MG capsule Take 2-3 capsules (10-15 mg total) by mouth at bedtime. TAKE 2 TO 3 CAPSULES AT BEDTIME  . Probiotic Product (ALIGN) 4 MG CAPS Take 1 capsule (4 mg total) by mouth daily.  . QUEtiapine (SEROQUEL) 300 MG tablet TAKE 1 TABLET BY MOUTH EVERYDAY AT BEDTIME (Patient taking differently: Take 300 mg by mouth at bedtime. TAKE 1 TABLET BY MOUTH EVERYDAY AT BEDTIME)  . rizatriptan (MAXALT-MLT) 10 MG disintegrating tablet Take 1 tablet (10 mg total) by mouth as needed for migraine. May repeat in 2 hours if needed     Allergies:   Tramadol and Trazodone and nefazodone   Social History   Tobacco Use  . Smoking status: Never Smoker  . Smokeless tobacco: Never Used  Vaping Use  . Vaping Use: Never used  Substance Use Topics  . Alcohol use: No  . Drug use: No    Comment: 08-31-2016 PER PT NO       Family Hx: The patient's family history includes COPD in her father; Cancer in her maternal grandmother; Diabetes in her brother, mother, and sister; Heart disease in her father and mother; Irritable bowel syndrome in her father; Kidney disease in her mother; Seizures in her father; Thyroid disease in her mother. There is no history of Colon cancer, Stomach cancer, Colon polyps, Esophageal cancer, or Rectal cancer.  ROS:   Please see the history of present illness.     All other systems reviewed and are negative.   Prior CV studies:   The following studies were reviewed today:  2D echo 05/2020 IMPRESSIONS   1. Left ventricular ejection fraction, by estimation, is 60 to 65%. The  left ventricle has normal function. The left ventricle has no regional  wall motion abnormalities. There is mild concentric left ventricular  hypertrophy. Left ventricular diastolic  parameters are consistent with Grade I diastolic dysfunction (impaired  relaxation).  2. Right ventricular systolic function is normal. The right ventricular  size is normal. Tricuspid regurgitation signal is inadequate for assessing  PA pressure.  3. The mitral valve is normal in structure. Trivial mitral valve  regurgitation.  4. The aortic valve is tricuspid. Aortic valve regurgitation is trivial.  5. The inferior vena cava is normal in size with greater than 50%  respiratory variability, suggesting right atrial pressure of 3 mmHg.   Comparison(s): No significant change from prior study.   Eugenie Birks Myoview 05/2020 Study Highlights    The left ventricular ejection fraction is hyperdynamic (>65%).  Nuclear stress EF: 66%.  There was no ST segment deviation noted during stress.  No T wave inversion was noted during stress.  The study is normal.  This is a low risk study.   1. Normal study without ischemia or infarction.  2. Normal LVEF, >65%.  3. This is a low-risk study.   Labs/Other Tests and Data  Reviewed:    EKG:  No ECG reviewed.  Recent Labs: 02/18/2020: TSH 1.530 05/06/2020:  ALT 37; BUN 20; Creatinine, Ser 1.50; Hemoglobin 12.2; Magnesium 2.0; Platelets 522; Potassium 5.0; Sodium 142   Recent Lipid Panel Lab Results  Component Value Date/Time   CHOL 185 02/18/2020 11:57 AM   TRIG 154 (H) 02/18/2020 11:57 AM   HDL 70 02/18/2020 11:57 AM   CHOLHDL 2.6 02/18/2020 11:57 AM   CHOLHDL 5.5 (H) 01/29/2016 10:09 AM   LDLCALC 89 02/18/2020 11:57 AM    Wt Readings from Last 3 Encounters:  06/26/20 180 lb (81.6 kg)  06/23/20 180 lb (81.6 kg)  06/09/20 189 lb (85.7 kg)     Risk Assessment/Calculations:      Objective:    Vital Signs:  BP 109/68   Ht 5\' 3"  (1.6 m)   Wt 180 lb (81.6 kg)   BMI 31.89 kg/m    VITAL SIGNS:  reviewed GEN:  no acute distress EYES:  sclerae anicteric, EOMI - Extraocular Movements Intact RESPIRATORY:  normal respiratory effort, symmetric expansion CARDIOVASCULAR:  no peripheral edema SKIN:  no rash, lesions or ulcers. MUSCULOSKELETAL:  no obvious deformities. NEURO:  alert and oriented x 3, no obvious focal deficit PSYCH:  normal affect  ASSESSMENT & PLAN:    1.  Dizziness -her sx in the past have really sounded more vertiginous than lightheadedness -event monitor showed occasional PACs and PVCs -I have encouraged her to push fluids to at least 64oz daily, liberalize Na, avoid caffeine and ETOH -she still has vertigo sx and is seeing ENT in the near future  2.  SOB -? Due to obesity and deconditioning  -she thinks this has improved -coronary CTA 06/2019 showed a coronary Ca score of 216 with mild CAD in the mid LAD of 25-49% -2D echo last month with normal LVF and no pericardial effusion -Lexiscan myoview 05/2020 with no ischemia -continue ASA, statin  3.  HTN -BP well controlled on exam today -she has not been on any meds recently   4.  HLD -followed by her PCP -LDL goal < 70 -LDL in June was 57 -continue Lipitor 20mg  daily  and Vascepa 2gm BID  5.  ASCAD -mild by coronary CTA with 25-49% mLAD lesion -she recently had some sharp chest pain and pain with palpation of her chest and with cough that was felt to be MSK when she was in the hospital with UTI -Lexiscan myoview 05/2020 with no ischemia -continue ASA 81mg  daily and statin  6.  Obesity -encouraged her to continue to exercise as weight loss will continue to improve her SOB  7.  Pericardial effusion -small by coronary CTA and echo a year ago -resolved on 2D echo 05/2020  8.  Preoperative clearance -she is having shoulder replacement surgery next month -her Revised cardiac risk index is 1 with a 0.9% periop risk of major cardiac event  -Lexiscan myoview recently showed no ischemia  COVID-19 Education: The signs and symptoms of COVID-19 were discussed with the patient and how to seek care for testing (follow up with PCP or arrange E-visit).  The importance of social distancing was discussed today.  Time:   Today, I have spent 20 minutes with the patient with telehealth technology discussing the above problems.     Medication Adjustments/Labs and Tests Ordered: Current medicines are reviewed at length with the patient today.  Concerns regarding medicines are outlined above.   Tests Ordered: No orders of the defined types were placed in this encounter.   Medication Changes: No orders of the defined types were placed in this encounter.  Follow Up:  In Person in 1 year(s)  Signed, Armanda Magic, MD  06/26/2020 7:59 AM    [facility]

## 2020-06-27 NOTE — Progress Notes (Signed)
Anesthesia Chart Review   Case: [id number] Date/Time: 07/03/20 0715   Procedure: REVERSE SHOULDER ARTHROPLASTY (Left Shoulder)   Anesthesia type: Choice   Pre-op diagnosis: LEFT SHOULDER ROTATOR CUFF ARTHROPATHY   Location: [facility] ROOM 07 / [facility]   Surgeons: Jones Broom, MD      DISCUSSION:64 y.o. never smoker with h/o PONV, HTN, GERD, CKD, CHF, left shoulder arthroplasty scheduled for above procedure 07/03/2020 with Dr. Jones Broom.   Pt last seen by cardiology 06/26/20. Per OV note, "-she is having shoulder replacement surgery next month -her Revised cardiac risk index is 1 with a 0.9% periop risk of major cardiac event  -Lexiscan myoview recently showed no ischemia"  Anticipate pt can proceed with planned procedure barring acute status change.   VS: BP (!) 142/98   Pulse (!) 111   Temp 36.9 C (Oral)   Ht 5\' 3"  (1.6 m)   Wt 86.2 kg   SpO2 97%   BMI 33.66 kg/m   PROVIDERS: Just, , FNP is PCP   Azalee Course, MD is Cardiologist  LABS: Labs reviewed: Acceptable for surgery. (all labs ordered are listed, but only abnormal results are displayed)  Labs Reviewed  CBC WITH DIFFERENTIAL/PLATELET - Abnormal; Notable for the following components:      Result Value   RDW 17.3 (*)    All other components within normal limits  COMPREHENSIVE METABOLIC PANEL - Abnormal; Notable for the following components:   Glucose, Bld 105 (*)    Creatinine, Ser 1.47 (*)    GFR, Estimated 39 (*)    All other components within normal limits  URINALYSIS, ROUTINE W REFLEX MICROSCOPIC - Abnormal; Notable for the following components:   Leukocytes,Ua SMALL (*)    Bacteria, UA RARE (*)    All other components within normal limits  SURGICAL PCR SCREEN  APTT  TYPE AND SCREEN     IMAGES:   EKG: 05/20/20 Rate 105 bpm    CV: Stress Test 06/09/2020   The left ventricular ejection fraction is hyperdynamic (>65%).  Nuclear stress EF: 66%.  There was no ST segment deviation noted  during stress.  No T wave inversion was noted during stress.  The study is normal.  This is a low risk study.   1. Normal study without ischemia or infarction.  2. Normal LVEF, >65%.  3. This is a low-risk study.   Echo 06/09/2020 IMPRESSIONS    1. Left ventricular ejection fraction, by estimation, is 60 to 65%. The  left ventricle has normal function. The left ventricle has no regional  wall motion abnormalities. There is mild concentric left ventricular  hypertrophy. Left ventricular diastolic  parameters are consistent with Grade I diastolic dysfunction (impaired  relaxation).  2. Right ventricular systolic function is normal. The right ventricular  size is normal. Tricuspid regurgitation signal is inadequate for assessing  PA pressure.  3. The mitral valve is normal in structure. Trivial mitral valve  regurgitation.  4. The aortic valve is tricuspid. Aortic valve regurgitation is trivial.  5. The inferior vena cava is normal in size with greater than 50%  respiratory variability, suggesting right atrial pressure of 3 mmHg.   Comparison(s): No significant change from prior study.  Past Medical History:  Diagnosis Date  . Acute encephalopathy 06/25/2016  . Anxiety   . Arthritis   . Cataract 08/31/2017   left eye  . CHF (congestive heart failure) (HCC)   . Cholesterol serum increased   . Chronic back pain  chronic Rt low back pain. s/p L4-5 fusion. failed Rt facet injections. poss due to Rt SI joint dysfunction.  . Chronic kidney disease   . Complication of anesthesia   . Depression   . Diastolic heart failure (HCC)   . GERD (gastroesophageal reflux disease)   . Hyperlipidemia   . Hyperparathyroidism (HCC)   . Hypertension   . Hypothyroidism   . Migraine   . Neuromuscular disorder (HCC)   . PONV (postoperative nausea and vomiting)   . Pre-diabetes   . Seizures (HCC)    3 years ago,06/20/19  . Sleep apnea    cpap  . Syncope 08/30/2015  . Tachycardia    . Tachycardia   . Trochanteric bursitis of both hips 2012   Confirmed on MRI    Past Surgical History:  Procedure Laterality Date  . ABDOMINAL HYSTERECTOMY    . APPENDECTOMY  1986  . BACK SURGERY     L4-5 fusion  . CATARACT EXTRACTION    . KNEE SURGERY    . SHOULDER ARTHROSCOPY WITH SUBACROMIAL DECOMPRESSION Left 10/20/2018   Procedure: SHOULDER ARTHROSCOPY, ROTATOR CUFF DEBRIDEMENT, GLENOHUMERAL JOINT DEBRIDEMENT, ACROMIOPLASTY, DISTAL CLAVICLE RESECTION;  Surgeon: Jodi Geralds, MD;  Location: [facility];  Service: Orthopedics;  Laterality: Left;    MEDICATIONS: . AIMOVIG 70 MG/ML SOAJ  . albuterol (VENTOLIN HFA) 108 (90 Base) MCG/ACT inhaler  . aspirin EC 81 MG tablet  . atorvastatin (LIPITOR) 20 MG tablet  . buPROPion (WELLBUTRIN XL) 150 MG 24 hr tablet  . busPIRone (BUSPAR) 7.5 MG tablet  . cetirizine (ZYRTEC) 10 MG tablet  . diclofenac sodium (VOLTAREN) 1 % GEL  . DULoxetine (CYMBALTA) 60 MG capsule  . esomeprazole (NEXIUM) 40 MG capsule  . famotidine (PEPCID) 20 MG tablet  . fluticasone (FLONASE) 50 MCG/ACT nasal spray  . gabapentin (NEURONTIN) 600 MG tablet  . hydrOXYzine (ATARAX/VISTARIL) 50 MG tablet  . icosapent Ethyl (VASCEPA) 1 g capsule  . levothyroxine (SYNTHROID) 50 MCG tablet  . levothyroxine (SYNTHROID) 75 MCG tablet  . meclizine (ANTIVERT) 50 MG tablet  . metFORMIN (GLUCOPHAGE) 500 MG tablet  . Multiple Vitamins-Minerals (MULTIVITAMIN WITH MINERALS) tablet  . ondansetron (ZOFRAN ODT) 4 MG disintegrating tablet  . prazosin (MINIPRESS) 5 MG capsule  . Probiotic Product (ALIGN) 4 MG CAPS  . QUEtiapine (SEROQUEL) 300 MG tablet  . rizatriptan (MAXALT-MLT) 10 MG disintegrating tablet   No current facility-administered medications for this encounter.    Jodell Cipro, [name] [facility] [phone]

## 2020-06-27 NOTE — Progress Notes (Signed)
Lab result: UA: small leukocytes.

## 2020-06-27 NOTE — Anesthesia Preprocedure Evaluation (Addendum)
Anesthesia Evaluation  Patient identified by MRN, date of birth, ID band Patient awake    Reviewed: Allergy & Precautions, NPO status , Patient's Chart, lab work & pertinent test results  History of Anesthesia Complications (+) PONV  Airway Mallampati: II  TM Distance: >3 FB Neck ROM: Full    Dental  (+) Teeth Intact   Pulmonary sleep apnea and Continuous Positive Airway Pressure Ventilation ,    Pulmonary exam normal        Cardiovascular hypertension, Pt. on medications  Rhythm:Regular Rate:Normal     Neuro/Psych  Headaches, Seizures - (02/21 last episode), Well Controlled,  Anxiety Depression    GI/Hepatic Neg liver ROS, GERD  Medicated and Controlled,  Endo/Other  diabetes, Type 2, Oral Hypoglycemic AgentsHypothyroidism   Renal/GU   negative genitourinary   Musculoskeletal  (+) Arthritis , Osteoarthritis,    Abdominal (+)  Abdomen: soft. Bowel sounds: normal.  Peds  Hematology negative hematology ROS (+)   Anesthesia Other Findings   Reproductive/Obstetrics                           Anesthesia Physical Anesthesia Plan  ASA: III  Anesthesia Plan: General and Regional   Post-op Pain Management:  Regional for Post-op pain   Induction: Intravenous  PONV Risk Score and Plan: Ondansetron, Dexamethasone, Midazolam and Treatment may vary due to age or medical condition  Airway Management Planned: Mask and Oral ETT  Additional Equipment: None  Intra-op Plan:   Post-operative Plan: Extubation in OR  Informed Consent: I have reviewed the patients History and Physical, chart, labs and discussed the procedure including the risks, benefits and alternatives for the proposed anesthesia with the patient or authorized representative who has indicated his/her understanding and acceptance.     Dental advisory given  Plan Discussed with: CRNA  Anesthesia Plan Comments: (See PAT note  06/26/2020, Jodell Cipro, [name] Stress 01/22:  The left ventricular ejection fraction is hyperdynamic (>65%).  Nuclear stress EF: 66%.  There was no ST segment deviation noted during stress.  No T wave inversion was noted during stress.  The study is normal.  This is a low risk study. 1. Normal study without ischemia or infarction.  2. Normal LVEF, >65%.  3. This is a low-risk study.  ECHO 01/22: 1. Left ventricular ejection fraction, by estimation, is 60 to 65%. The  left ventricle has normal function. The left ventricle has no regional  wall motion abnormalities. There is mild concentric left ventricular  hypertrophy. Left ventricular diastolic  parameters are consistent with Grade I diastolic dysfunction (impaired  relaxation).  2. Right ventricular systolic function is normal. The right ventricular  size is normal. Tricuspid regurgitation signal is inadequate for assessing  PA pressure.  3. The mitral valve is normal in structure. Trivial mitral valve  regurgitation.  4. The aortic valve is tricuspid. Aortic valve regurgitation is trivial.  5. The inferior vena cava is normal in size with greater than 50%  respiratory variability, suggesting right atrial pressure of 3 mmHg.)      Anesthesia Quick Evaluation

## 2020-06-30 ENCOUNTER — Other Ambulatory Visit (HOSPITAL_COMMUNITY): Payer: Medicare Other

## 2020-06-30 ENCOUNTER — Other Ambulatory Visit (HOSPITAL_COMMUNITY)
Admission: RE | Admit: 2020-06-30 | Discharge: 2020-06-30 | Disposition: A | Discharge status: Home / Self Care | Payer: Medicare Other | Source: Ambulatory Visit | Attending: Orthopedic Surgery | Admitting: Orthopedic Surgery

## 2020-06-30 DIAGNOSIS — Z01812 Encounter for preprocedural laboratory examination: Secondary | ICD-10-CM | POA: Insufficient documentation

## 2020-06-30 DIAGNOSIS — Z20822 Contact with and (suspected) exposure to covid-19: Secondary | ICD-10-CM | POA: Diagnosis not present

## 2020-06-30 LAB — SARS CORONAVIRUS 2 (TAT 6-24 HRS): SARS Coronavirus 2: NEGATIVE

## 2020-07-03 ENCOUNTER — Encounter (HOSPITAL_COMMUNITY)
Admission: RE | Disposition: A | Discharge status: Home / Self Care | Payer: Self-pay | Source: Home / Self Care | Attending: Orthopedic Surgery

## 2020-07-03 ENCOUNTER — Ambulatory Visit (HOSPITAL_COMMUNITY): Payer: Medicare Other

## 2020-07-03 ENCOUNTER — Ambulatory Visit (HOSPITAL_COMMUNITY): Payer: Medicare Other | Admitting: Certified Registered Nurse Anesthetist

## 2020-07-03 ENCOUNTER — Encounter (HOSPITAL_COMMUNITY): Payer: Self-pay | Admitting: Orthopedic Surgery

## 2020-07-03 ENCOUNTER — Ambulatory Visit (HOSPITAL_COMMUNITY): Payer: Medicare Other | Admitting: Physician Assistant

## 2020-07-03 ENCOUNTER — Ambulatory Visit (HOSPITAL_COMMUNITY)
Admission: RE | Admit: 2020-07-03 | Discharge: 2020-07-03 | Disposition: A | Discharge status: Home / Self Care | Payer: Medicare Other | Attending: Orthopedic Surgery | Admitting: Orthopedic Surgery

## 2020-07-03 DIAGNOSIS — M75102 Unspecified rotator cuff tear or rupture of left shoulder, not specified as traumatic: Secondary | ICD-10-CM | POA: Diagnosis not present

## 2020-07-03 DIAGNOSIS — Z01811 Encounter for preprocedural respiratory examination: Secondary | ICD-10-CM

## 2020-07-03 DIAGNOSIS — M19012 Primary osteoarthritis, left shoulder: Secondary | ICD-10-CM | POA: Insufficient documentation

## 2020-07-03 DIAGNOSIS — Z96612 Presence of left artificial shoulder joint: Secondary | ICD-10-CM | POA: Diagnosis not present

## 2020-07-03 DIAGNOSIS — Z79890 Hormone replacement therapy (postmenopausal): Secondary | ICD-10-CM | POA: Insufficient documentation

## 2020-07-03 DIAGNOSIS — Z7982 Long term (current) use of aspirin: Secondary | ICD-10-CM | POA: Insufficient documentation

## 2020-07-03 DIAGNOSIS — M12812 Other specific arthropathies, not elsewhere classified, left shoulder: Secondary | ICD-10-CM | POA: Diagnosis not present

## 2020-07-03 DIAGNOSIS — Z471 Aftercare following joint replacement surgery: Secondary | ICD-10-CM | POA: Diagnosis not present

## 2020-07-03 DIAGNOSIS — Z96642 Presence of left artificial hip joint: Secondary | ICD-10-CM | POA: Diagnosis not present

## 2020-07-03 DIAGNOSIS — Z79899 Other long term (current) drug therapy: Secondary | ICD-10-CM | POA: Diagnosis not present

## 2020-07-03 DIAGNOSIS — G8918 Other acute postprocedural pain: Secondary | ICD-10-CM | POA: Diagnosis not present

## 2020-07-03 DIAGNOSIS — E782 Mixed hyperlipidemia: Secondary | ICD-10-CM | POA: Diagnosis not present

## 2020-07-03 HISTORY — PX: REVERSE SHOULDER ARTHROPLASTY: SHX5054

## 2020-07-03 LAB — TYPE AND SCREEN
ABO/RH(D): O NEG
Antibody Screen: NEGATIVE

## 2020-07-03 SURGERY — ARTHROPLASTY, SHOULDER, TOTAL, REVERSE
Anesthesia: Regional | Site: Shoulder | Laterality: Left

## 2020-07-03 MED ORDER — ONDANSETRON HCL 4 MG/2ML IJ SOLN
INTRAMUSCULAR | Status: DC | PRN
  Administered 2020-07-03: 4 mg via INTRAVENOUS

## 2020-07-03 MED ORDER — ONDANSETRON HCL 4 MG/2ML IJ SOLN
INTRAMUSCULAR | Status: AC
  Filled 2020-07-03: qty 2

## 2020-07-03 MED ORDER — WATER FOR IRRIGATION, STERILE IR SOLN
Status: DC | PRN
  Administered 2020-07-03: 2000 mL

## 2020-07-03 MED ORDER — BUPIVACAINE LIPOSOME 1.3 % IJ SUSP
INTRAMUSCULAR | Status: DC | PRN
  Administered 2020-07-03: 10 mL via PERINEURAL

## 2020-07-03 MED ORDER — DEXAMETHASONE SODIUM PHOSPHATE 10 MG/ML IJ SOLN
INTRAMUSCULAR | Status: AC
  Filled 2020-07-03: qty 1

## 2020-07-03 MED ORDER — LACTATED RINGERS IV BOLUS
500.0000 mL | Freq: Once | INTRAVENOUS | Status: AC
  Administered 2020-07-03: 500 mL via INTRAVENOUS

## 2020-07-03 MED ORDER — LIDOCAINE HCL (PF) 2 % IJ SOLN
INTRAMUSCULAR | Status: AC
  Filled 2020-07-03: qty 5

## 2020-07-03 MED ORDER — PHENYLEPHRINE 40 MCG/ML (10ML) SYRINGE FOR IV PUSH (FOR BLOOD PRESSURE SUPPORT)
PREFILLED_SYRINGE | INTRAVENOUS | Status: DC | PRN
  Administered 2020-07-03 (×4): 160 ug via INTRAVENOUS
  Administered 2020-07-03: 120 ug via INTRAVENOUS
  Administered 2020-07-03: 160 ug via INTRAVENOUS
  Administered 2020-07-03 (×2): 120 ug via INTRAVENOUS

## 2020-07-03 MED ORDER — SODIUM CHLORIDE 0.9 % IR SOLN
Status: DC | PRN
  Administered 2020-07-03: 1000 mL

## 2020-07-03 MED ORDER — PHENYLEPHRINE HCL-NACL 10-0.9 MG/250ML-% IV SOLN
INTRAVENOUS | Status: DC | PRN
  Administered 2020-07-03: 50 ug/min via INTRAVENOUS

## 2020-07-03 MED ORDER — MIDAZOLAM HCL 5 MG/5ML IJ SOLN
INTRAMUSCULAR | Status: DC | PRN
  Administered 2020-07-03: 2 mg via INTRAVENOUS

## 2020-07-03 MED ORDER — PHENYLEPHRINE 40 MCG/ML (10ML) SYRINGE FOR IV PUSH (FOR BLOOD PRESSURE SUPPORT)
PREFILLED_SYRINGE | INTRAVENOUS | Status: AC
  Filled 2020-07-03: qty 30

## 2020-07-03 MED ORDER — FENTANYL CITRATE (PF) 250 MCG/5ML IJ SOLN
INTRAMUSCULAR | Status: DC | PRN
  Administered 2020-07-03 (×2): 50 ug via INTRAVENOUS

## 2020-07-03 MED ORDER — LIDOCAINE 2% (20 MG/ML) 5 ML SYRINGE
INTRAMUSCULAR | Status: DC | PRN
  Administered 2020-07-03: 40 mg via INTRAVENOUS

## 2020-07-03 MED ORDER — DEXAMETHASONE SODIUM PHOSPHATE 10 MG/ML IJ SOLN
INTRAMUSCULAR | Status: DC | PRN
  Administered 2020-07-03: 5 mg

## 2020-07-03 MED ORDER — ORAL CARE MOUTH RINSE: 15.0000 mL | Freq: Once | OROMUCOSAL | Status: AC

## 2020-07-03 MED ORDER — CHLORHEXIDINE GLUCONATE 0.12 % MT SOLN
15.0000 mL | Freq: Once | OROMUCOSAL | Status: AC
  Administered 2020-07-03: 15 mL via OROMUCOSAL

## 2020-07-03 MED ORDER — 0.9 % SODIUM CHLORIDE (POUR BTL) OPTIME
TOPICAL | Status: DC | PRN
  Administered 2020-07-03: 1000 mL

## 2020-07-03 MED ORDER — PROMETHAZINE HCL 25 MG/ML IJ SOLN: 6.2500 mg | INTRAMUSCULAR | Status: DC | PRN

## 2020-07-03 MED ORDER — AMISULPRIDE (ANTIEMETIC) 5 MG/2ML IV SOLN: 10.0000 mg | Freq: Once | INTRAVENOUS | Status: DC | PRN

## 2020-07-03 MED ORDER — MIDAZOLAM HCL 2 MG/2ML IJ SOLN
INTRAMUSCULAR | Status: AC
  Filled 2020-07-03: qty 2

## 2020-07-03 MED ORDER — VASOPRESSIN 20 UNIT/ML IV SOLN
INTRAVENOUS | Status: DC | PRN
  Administered 2020-07-03 (×3): 1 [IU] via INTRAVENOUS

## 2020-07-03 MED ORDER — EPHEDRINE SULFATE-NACL 50-0.9 MG/10ML-% IV SOSY
PREFILLED_SYRINGE | INTRAVENOUS | Status: DC | PRN
  Administered 2020-07-03 (×2): 10 mg via INTRAVENOUS
  Administered 2020-07-03: 5 mg via INTRAVENOUS

## 2020-07-03 MED ORDER — ROCURONIUM BROMIDE 10 MG/ML (PF) SYRINGE
PREFILLED_SYRINGE | INTRAVENOUS | Status: DC | PRN
  Administered 2020-07-03: 6 mg via INTRAVENOUS

## 2020-07-03 MED ORDER — FENTANYL CITRATE (PF) 100 MCG/2ML IJ SOLN
INTRAMUSCULAR | Status: AC
  Filled 2020-07-03: qty 2

## 2020-07-03 MED ORDER — TIZANIDINE HCL 4 MG PO TABS: 4.0000 mg | ORAL_TABLET | Freq: Three times a day (TID) | ORAL | 1 refills | Status: DC | PRN

## 2020-07-03 MED ORDER — SUGAMMADEX SODIUM 200 MG/2ML IV SOLN
INTRAVENOUS | Status: DC | PRN
  Administered 2020-07-03: 200 mg via INTRAVENOUS

## 2020-07-03 MED ORDER — NOREPINEPHRINE 4 MG/250ML-% IV SOLN
2.0000 ug/min | INTRAVENOUS | Status: DC
  Filled 2020-07-03: qty 250

## 2020-07-03 MED ORDER — DEXAMETHASONE SODIUM PHOSPHATE 10 MG/ML IJ SOLN
INTRAMUSCULAR | Status: DC | PRN
  Administered 2020-07-03: 5 mg via INTRAVENOUS

## 2020-07-03 MED ORDER — LACTATED RINGERS IV SOLN: INTRAVENOUS | Status: DC

## 2020-07-03 MED ORDER — CEFAZOLIN SODIUM-DEXTROSE 2-4 GM/100ML-% IV SOLN
2.0000 g | INTRAVENOUS | Status: AC
  Administered 2020-07-03: 2 g via INTRAVENOUS
  Filled 2020-07-03: qty 100

## 2020-07-03 MED ORDER — TRANEXAMIC ACID-NACL 1000-0.7 MG/100ML-% IV SOLN
1000.0000 mg | INTRAVENOUS | Status: AC
  Administered 2020-07-03: 1000 mg via INTRAVENOUS
  Filled 2020-07-03: qty 100

## 2020-07-03 MED ORDER — PROPOFOL 10 MG/ML IV BOLUS
INTRAVENOUS | Status: DC | PRN
  Administered 2020-07-03: 160 mg via INTRAVENOUS

## 2020-07-03 MED ORDER — FENTANYL CITRATE (PF) 100 MCG/2ML IJ SOLN: 25.0000 ug | INTRAMUSCULAR | Status: DC | PRN

## 2020-07-03 MED ORDER — PROPOFOL 10 MG/ML IV BOLUS
INTRAVENOUS | Status: AC
  Filled 2020-07-03: qty 20

## 2020-07-03 MED ORDER — OXYCODONE-ACETAMINOPHEN 5-325 MG PO TABS: ORAL_TABLET | ORAL | 0 refills | Status: DC

## 2020-07-03 MED ORDER — BUPIVACAINE HCL (PF) 0.5 % IJ SOLN
INTRAMUSCULAR | Status: DC | PRN
  Administered 2020-07-03: 15 mL via PERINEURAL

## 2020-07-03 MED ORDER — PHENYLEPHRINE HCL-NACL 10-0.9 MG/250ML-% IV SOLN
INTRAVENOUS | Status: AC
  Filled 2020-07-03: qty 250

## 2020-07-03 MED ORDER — ACETAMINOPHEN 10 MG/ML IV SOLN: 1000.0000 mg | Freq: Once | INTRAVENOUS | Status: DC | PRN

## 2020-07-03 SURGICAL SUPPLY — 80 items
AID PSTN UNV HD RSTRNT DISP (MISCELLANEOUS)
BAG SPEC THK2 15X12 ZIP CLS (MISCELLANEOUS) ×1
BAG ZIPLOCK 12X15 (MISCELLANEOUS) ×2 IMPLANT
BASEPLATE P2 COATD GLND 6.5X30 (Shoulder) IMPLANT
BIT DRILL 1.6MX128 (BIT) IMPLANT
BIT DRILL 2.5 DIA 127 CALI (BIT) ×1 IMPLANT
BIT DRILL 4 DIA CALIBRATED (BIT) ×1 IMPLANT
BLADE SAW SAG 73X25 THK (BLADE) ×1
BLADE SAW SGTL 73X25 THK (BLADE) ×1 IMPLANT
BOOTIES KNEE HIGH SLOAN (MISCELLANEOUS) ×4 IMPLANT
BSPLAT GLND 30 STRL LF SHLDR (Shoulder) ×1 IMPLANT
COOLER ICEMAN CLASSIC (MISCELLANEOUS) IMPLANT
COVER BACK TABLE 60X90IN (DRAPES) ×2 IMPLANT
COVER SURGICAL LIGHT HANDLE (MISCELLANEOUS) ×2 IMPLANT
COVER WAND RF STERILE (DRAPES) IMPLANT
DRAPE INCISE IOBAN 66X45 STRL (DRAPES) ×2 IMPLANT
DRAPE ORTHO SPLIT 77X108 STRL (DRAPES) ×4
DRAPE POUCH INSTRU U-SHP 10X18 (DRAPES) ×2 IMPLANT
DRAPE SHEET LG 3/4 BI-LAMINATE (DRAPES) ×2 IMPLANT
DRAPE SURG 17X11 SM STRL (DRAPES) ×2 IMPLANT
DRAPE SURG ORHT 6 SPLT 77X108 (DRAPES) ×2 IMPLANT
DRAPE TOP 10253 STERILE (DRAPES) ×2 IMPLANT
DRAPE U-SHAPE 47X51 STRL (DRAPES) ×2 IMPLANT
DRSG AQUACEL AG ADV 3.5X 6 (GAUZE/BANDAGES/DRESSINGS) ×2 IMPLANT
DURAPREP 26ML APPLICATOR (WOUND CARE) ×4 IMPLANT
ELECT BLADE TIP CTD 4 INCH (ELECTRODE) ×2 IMPLANT
ELECT REM PT RETURN 15FT ADLT (MISCELLANEOUS) ×2 IMPLANT
GLOVE SRG 8 PF TXTR STRL LF DI (GLOVE) ×1 IMPLANT
GLOVE SURG ENC MOIS LTX SZ7 (GLOVE) ×2 IMPLANT
GLOVE SURG ENC MOIS LTX SZ7.5 (GLOVE) ×2 IMPLANT
GLOVE SURG UNDER POLY LF SZ7 (GLOVE) ×2 IMPLANT
GLOVE SURG UNDER POLY LF SZ8 (GLOVE) ×2
GOWN STRL REUS W/TWL LRG LVL3 (GOWN DISPOSABLE) ×2 IMPLANT
GOWN STRL REUS W/TWL XL LVL3 (GOWN DISPOSABLE) ×2 IMPLANT
HANDPIECE INTERPULSE COAX TIP (DISPOSABLE) ×2
HOOD PEEL AWAY FLYTE STAYCOOL (MISCELLANEOUS) ×6 IMPLANT
INSERT SMALL SOCKET 32MM NEU (Insert) ×1 IMPLANT
KIT BASIN OR (CUSTOM PROCEDURE TRAY) ×2 IMPLANT
KIT TURNOVER KIT A (KITS) ×2 IMPLANT
MANIFOLD NEPTUNE II (INSTRUMENTS) ×2 IMPLANT
NDL TROCAR POINT SZ 2 1/2 (NEEDLE) IMPLANT
NEEDLE TROCAR POINT SZ 2 1/2 (NEEDLE) IMPLANT
NS IRRIG 1000ML POUR BTL (IV SOLUTION) ×2 IMPLANT
P2 COATDE GLNOID BSEPLT 6.5X30 (Shoulder) ×2 IMPLANT
PACK SHOULDER (CUSTOM PROCEDURE TRAY) ×2 IMPLANT
PAD COLD SHLDR WRAP-ON (PAD) ×1 IMPLANT
PAD ORTHO SHOULDER 7X19 LRG (SOFTGOODS) ×1 IMPLANT
PROTECTOR NERVE ULNAR (MISCELLANEOUS) IMPLANT
RESTRAINT HEAD UNIVERSAL NS (MISCELLANEOUS) IMPLANT
RETRIEVER SUT HEWSON (MISCELLANEOUS) IMPLANT
SCREW BONE LOCKING RSP 5.0X14 (Screw) ×2 IMPLANT
SCREW BONE RSP LOCK 5X14 (Screw) IMPLANT
SCREW BONE RSP LOCK 5X18 (Screw) IMPLANT
SCREW BONE RSP LOCK 5X22 (Screw) IMPLANT
SCREW BONE RSP LOCK 5X26 (Screw) IMPLANT
SCREW BONE RSP LOCKING 18MM LG (Screw) ×2 IMPLANT
SCREW BONE RSP LOCKING 5.0X26 (Screw) ×2 IMPLANT
SCREW BONE RSP LOCKING 5.0X32 (Screw) ×2 IMPLANT
SCREW RETAIN W/HEAD 4MM OFFSET (Shoulder) ×1 IMPLANT
SET HNDPC FAN SPRY TIP SCT (DISPOSABLE) ×1 IMPLANT
SLING ARM FOAM STRAP MED (SOFTGOODS) ×1 IMPLANT
SLING ARM IMMOBILIZER LRG (SOFTGOODS) ×1 IMPLANT
SLING ARM IMMOBILIZER MED (SOFTGOODS) IMPLANT
SPONGE LAP 18X18 RF (DISPOSABLE) ×1 IMPLANT
STEM HUMERAL 8X48 SHOULDER (Miscellaneous) ×1 IMPLANT
STRIP CLOSURE SKIN 1/2X4 (GAUZE/BANDAGES/DRESSINGS) ×4 IMPLANT
SUCTION FRAZIER HANDLE 10FR (MISCELLANEOUS)
SUCTION TUBE FRAZIER 10FR DISP (MISCELLANEOUS) IMPLANT
SUPPORT WRAP ARM LG (MISCELLANEOUS) IMPLANT
SUT ETHIBOND 2 V 37 (SUTURE) IMPLANT
SUT FIBERWIRE #2 38 REV NDL BL (SUTURE)
SUT MNCRL AB 4-0 PS2 18 (SUTURE) ×2 IMPLANT
SUT VIC AB 2-0 CT1 27 (SUTURE) ×2
SUT VIC AB 2-0 CT1 TAPERPNT 27 (SUTURE) ×1 IMPLANT
SUTURE FIBERWR#2 38 REV NDL BL (SUTURE) IMPLANT
TAPE LABRALWHITE 1.5X36 (TAPE) IMPLANT
TAPE SUT LABRALTAP WHT/BLK (SUTURE) IMPLANT
TOWEL OR 17X26 10 PK STRL BLUE (TOWEL DISPOSABLE) ×2 IMPLANT
TOWEL OR NON WOVEN STRL DISP B (DISPOSABLE) ×2 IMPLANT
WATER STERILE IRR 1000ML POUR (IV SOLUTION) ×2 IMPLANT

## 2020-07-03 NOTE — Anesthesia Postprocedure Evaluation (Signed)
Anesthesia Post Note  Patient: Jennifer Davidson  Procedure(s) Performed: REVERSE SHOULDER ARTHROPLASTY (Left Shoulder)     Patient location during evaluation: PACU Anesthesia Type: Regional and General Level of consciousness: awake and alert Pain management: pain level controlled Vital Signs Assessment: post-procedure vital signs reviewed and stable Respiratory status: spontaneous breathing, nonlabored ventilation, respiratory function stable and patient connected to nasal cannula oxygen Cardiovascular status: blood pressure returned to baseline and stable Postop Assessment: no apparent nausea or vomiting Anesthetic complications: no   No complications documented.  Last Vitals:  Vitals:   07/03/20 0951 07/03/20 1002  BP:  100/78  Pulse: 93 97  Resp: (!) 25 18  Temp:  36.9 C  SpO2: 98% 96%    Last Pain:  Vitals:   07/03/20 1002  TempSrc:   PainSc: 0-No pain                 Earl Lites P [place] Carreno

## 2020-07-03 NOTE — Anesthesia Procedure Notes (Signed)
Anesthesia Regional Block: Interscalene brachial plexus block   Pre-Anesthetic Checklist: ,, timeout performed, Correct Patient, Correct Site, Correct Laterality, Correct Procedure, Correct Position, site marked, Risks and benefits discussed,  Surgical consent,  Pre-op evaluation,  At surgeon's request and post-op pain management  Laterality: Left  Prep: Dura Prep       Needles:  Injection technique: Single-shot  Needle Type: Echogenic Stimulator Needle     Needle Length: 5cm  Needle Gauge: 20     Additional Needles:   Procedures:,,,, ultrasound used (permanent image in chart),,,,  Narrative:  Start time: 07/03/2020 6:50 AM End time: 07/03/2020 6:54 AM Injection made incrementally with aspirations every 5 mL.  Performed by: Personally  Anesthesiologist: Atilano Median, DO  Additional Notes: Patient identified. Risks/Benefits/Options discussed with patient including but not limited to bleeding, infection, nerve damage, failed block, incomplete pain control. Patient expressed understanding and wished to proceed. All questions were answered. Sterile technique was used throughout the entire procedure. Please see nursing notes for vital signs. Aspirated in 5cc intervals with injection for negative confirmation. Patient was given instructions on fall risk and not to get out of bed. All questions and concerns addressed with instructions to call with any issues or inadequate analgesia.

## 2020-07-03 NOTE — Op Note (Signed)
Procedure(s): REVERSE SHOULDER ARTHROPLASTY Procedure Note  Jennifer Davidson female 65 y.o. 07/03/2020   Preoperative diagnosis: Left shoulder rotator cuff tear arthropathy  Postoperative diagnosis: Same  Procedure(s) and Anesthesia Type:    L REVERSE SHOULDER ARTHROPLASTY - General   Indications:  65 y.o. female  With endstage left shoulder arthritis with irrepairable rotator cuff tear. Pain and dysfunction interfered with quality of life and nonoperative treatment with activity modification, NSAIDS and injections failed.     Surgeon: Berline Lopes   Assistants: Damita Lack [name] [facility] was present and scrubbed throughout the procedure and was essential in positioning, retraction, exposure, and closure)  Anesthesia: General endotracheal anesthesia with preoperative interscalene block given by the attending anesthesiologist     Procedure Detail  REVERSE SHOULDER ARTHROPLASTY   Estimated Blood Loss:  200 mL         Drains: none  Blood Given: none          Specimens: none        Complications:  * No complications entered in OR log *         Disposition: PACU - hemodynamically stable.         Condition: stable      OPERATIVE FINDINGS:  A DJO Altivate pressfit reverse total shoulder arthroplasty was placed with a  size 8 stem, a 32-4 glenosphere, and a standard-mm poly insert. The base plate  fixation was excellent.  PROCEDURE: The patient was identified in the preoperative holding area  where I personally marked the operative site after verifying site, side,  and procedure with the patient. An interscalene block given by  the attending anesthesiologist in the holding area and the patient was taken back to the operating room where all extremities were  carefully padded in position after general anesthesia was induced. She  was placed in a beach-chair position and the operative upper extremity was  prepped and draped in a standard sterile fashion. An  approximately 10-  cm incision was made from the tip of the coracoid process to the center  point of the humerus at the level of the axilla. Dissection was carried  down through subcutaneous tissues to the level of the cephalic vein  which was taken laterally with the deltoid. The pectoralis major was  retracted medially. The subdeltoid space was developed and the lateral  edge of the conjoined tendon was identified. The undersurface of  conjoined tendon was palpated and the musculocutaneous nerve was not in  the field. Retractor was placed underneath the conjoined and second  retractor was placed lateral into the deltoid. The circumflex humeral  artery and vessels were identified and clamped and coagulated. The  biceps tendon was absent.  The subscapularis was absent.  The  joint was then gently externally rotated while the capsule was released  from the humeral neck around to just beyond the 6 o'clock position. At  this point, the joint was dislocated and the humeral head was presented  into the wound. The excessive osteophyte formation was removed with a  large rongeur.  The cutting guide was used to make the appropriate  head cut and the head was saved for potentially bone grafting.  The glenoid was exposed with the arm in an  abducted extended position. The anterior and posterior labrum were  completely excised and the capsule was released circumferentially to  allow for exposure of the glenoid for preparation. The 2.5 mm drill was  placed using the guide in 5-10 inferior angulation and  the tap was then advanced in the same hole. Small and large reamers were then used. The tap was then removed and the Metaglene was then screwed in with excellent purchase.  The peripheral guide was then used to drilled measured and filled peripheral locking screws. The size 32-4 glenosphere was then impacted on the [facility] taper and the central screw was placed. The humerus was then again exposed and the  diaphyseal reamers were used followed by the metaphyseal reamers. The final broach was left in place in the proximal trial was placed. The joint was reduced and with this implant it was felt that soft tissue tensioning was appropriate with excellent stability and excellent range of motion. Therefore, final humeral stem was placed press-fit with bone graft.  And then the trial polyethylene inserts were tested again and the above implant was felt to be the most appropriate for final insertion. The joint was reduced taken through full range of motion and felt to be stable. Soft tissue tension was appropriate.  The joint was then copiously irrigated with pulse  lavage and the wound was then closed. The subscapularis was not repaired.  Skin was closed with 2-0 Vicryl in a deep dermal layer and 4-0  Monocryl for skin closure. Steri-Strips were applied. Sterile  dressings were then applied as well as a sling. The patient was allowed  to awaken from general anesthesia, transferred to stretcher, and taken  to recovery room in stable condition.   POSTOPERATIVE PLAN: The patient will be observed in the recovery room.  If her pain is well controlled and she is hemodynamically stable she could be discharged home today with family.  If there is any concern we can keep her overnight for observation.

## 2020-07-03 NOTE — Discharge Instructions (Signed)

## 2020-07-03 NOTE — Addendum Note (Signed)
Addendum  created 07/03/20 1021 by Sudie Grumbling, CRNA   Charge Capture section accepted

## 2020-07-03 NOTE — H&P (Signed)
Jennifer Davidson is an 65 y.o. female.   Chief Complaint: L shoulder pain and dysfunction HPI: L shoulder arthropathy with irrepairable rotator cuff tear.  Failed conservative treatment.  Past Medical History:  Diagnosis Date  . Acute encephalopathy 06/25/2016  . Anxiety   . Arthritis   . Cataract 08/31/2017   left eye  . CHF (congestive heart failure) (HCC)   . Cholesterol serum increased   . Chronic back pain    chronic Rt low back pain. s/p L4-5 fusion. failed Rt facet injections. poss due to Rt SI joint dysfunction.  . Chronic kidney disease   . Complication of anesthesia   . Depression   . Diastolic heart failure (HCC)   . GERD (gastroesophageal reflux disease)   . Hyperlipidemia   . Hyperparathyroidism (HCC)   . Hypertension   . Hypothyroidism   . Migraine   . Neuromuscular disorder (HCC)   . PONV (postoperative nausea and vomiting)   . Pre-diabetes   . Seizures (HCC)    3 years ago,06/20/19  . Sleep apnea    cpap  . Syncope 08/30/2015  . Tachycardia   . Tachycardia   . Trochanteric bursitis of both hips 2012   Confirmed on MRI    Past Surgical History:  Procedure Laterality Date  . ABDOMINAL HYSTERECTOMY    . APPENDECTOMY  1986  . BACK SURGERY     L4-5 fusion  . CATARACT EXTRACTION    . KNEE SURGERY    . SHOULDER ARTHROSCOPY WITH SUBACROMIAL DECOMPRESSION Left 10/20/2018   Procedure: SHOULDER ARTHROSCOPY, ROTATOR CUFF DEBRIDEMENT, GLENOHUMERAL JOINT DEBRIDEMENT, ACROMIOPLASTY, DISTAL CLAVICLE RESECTION;  Surgeon: Jodi Geralds, MD;  Location: [facility];  Service: Orthopedics;  Laterality: Left;    Family History  Problem Relation Age of Onset  . Diabetes Mother   . Heart disease Mother   . Kidney disease Mother   . Thyroid disease Mother   . Heart disease Father   . Seizures Father   . COPD Father   . Irritable bowel syndrome Father   . Cancer Maternal Grandmother   . Diabetes Sister   . Diabetes Brother   . Colon cancer Neg Hx   . Stomach cancer Neg Hx   .  Colon polyps Neg Hx   . Esophageal cancer Neg Hx   . Rectal cancer Neg Hx    Social History:  reports that she has never smoked. She has never used smokeless tobacco. She reports that she does not drink alcohol and does not use drugs.  Allergies:  Allergies  Allergen Reactions  . Tramadol Nausea And Vomiting  . Trazodone And Nefazodone Other (See Comments)    Per pt trazodone caused hallucinations and behavior changes     Medications Prior to Admission  Medication Sig Dispense Refill  . AIMOVIG 70 MG/ML SOAJ INJECT 70 MG INTO THE SKIN EVERY 28 (TWENTY-EIGHT) DAYS. 1 mL 0  . albuterol (VENTOLIN HFA) 108 (90 Base) MCG/ACT inhaler TAKE 2 PUFFS BY MOUTH EVERY 6 HOURS AS NEEDED FOR WHEEZE OR SHORTNESS OF BREATH 18 each 0  . aspirin EC 81 MG tablet Take 1 tablet (81 mg total) by mouth daily. 90 tablet 3  . atorvastatin (LIPITOR) 20 MG tablet TAKE 1 TABLET BY MOUTH EVERY DAY (Patient taking differently: Take 20 mg by mouth daily.) 90 tablet 2  . buPROPion (WELLBUTRIN XL) 150 MG 24 hr tablet Take 1 tablet (150 mg total) by mouth daily. 90 tablet 1  . busPIRone (BUSPAR) 7.5 MG tablet Take  1 tablet (7.5 mg total) by mouth 3 (three) times daily. 270 tablet 1  . cetirizine (ZYRTEC) 10 MG tablet Take 1 tablet (10 mg total) by mouth at bedtime. 90 tablet 3  . diclofenac sodium (VOLTAREN) 1 % GEL Apply 2 g topically daily as needed (for knee pain). 100 g 3  . DULoxetine (CYMBALTA) 60 MG capsule TAKE 1 CAPSULE EVERY DAY (Patient taking differently: Take 60 mg by mouth daily.) 90 capsule 1  . esomeprazole (NEXIUM) 40 MG capsule Take 1 capsule (40 mg total) by mouth daily. 90 capsule 3  . famotidine (PEPCID) 20 MG tablet Take 1 tablet (20 mg total) by mouth daily as needed for heartburn or indigestion. 90 tablet 3  . fluticasone (FLONASE) 50 MCG/ACT nasal spray PLACE 2 SPRAYS AT BEDTIME INTO BOTH NOSTRILS. (Patient taking differently: Place 2 sprays into both nostrils at bedtime. PLACE 2 SPRAYS AT BEDTIME  INTO BOTH NOSTRILS.) 16 g 5  . gabapentin (NEURONTIN) 600 MG tablet Take 1 tablet (600 mg total) by mouth 3 (three) times daily. (Patient taking differently: Take 900 mg by mouth at bedtime.) 270 tablet 1  . hydrOXYzine (ATARAX/VISTARIL) 50 MG tablet TAKE 1 TABLET EVERY 6 HOURS AS NEEDED (Patient taking differently: Take 50 mg by mouth every 6 (six) hours as needed for anxiety.) 360 tablet 2  . icosapent Ethyl (VASCEPA) 1 g capsule Take 2 capsules (2 g total) by mouth 2 (two) times daily. 120 capsule 11  . levothyroxine (SYNTHROID) 50 MCG tablet TAKE 1 TABLET BY MOUTH EVERY OTHER DAY. BEFORE BREAKFAST, ALTERNATED WITH TABLET. (Patient taking differently: Take 50 mcg by mouth every other day. Before breakfast) 90 tablet 1  . levothyroxine (SYNTHROID) 75 MCG tablet Take 1 tablet (75 mcg total) by mouth every other day. Before breakfast 90 tablet 3  . meclizine (ANTIVERT) 50 MG tablet Take 0.5 tablets (25 mg total) by mouth 2 (two) times daily as needed for dizziness. 60 tablet 3  . metFORMIN (GLUCOPHAGE) 500 MG tablet Take 1 tablet (500 mg total) by mouth daily with breakfast. 90 tablet 1  . Multiple Vitamins-Minerals (MULTIVITAMIN WITH MINERALS) tablet Take 1 tablet by mouth daily.    . ondansetron (ZOFRAN ODT) 4 MG disintegrating tablet Take 1 tablet (4 mg total) by mouth every 8 (eight) hours as needed for nausea or vomiting. 20 tablet 0  . prazosin (MINIPRESS) 5 MG capsule Take 2-3 capsules (10-15 mg total) by mouth at bedtime. TAKE 2 TO 3 CAPSULES AT BEDTIME 270 capsule 1  . Probiotic Product (ALIGN) 4 MG CAPS Take 1 capsule (4 mg total) by mouth daily. 30 capsule 0  . QUEtiapine (SEROQUEL) 300 MG tablet TAKE 1 TABLET BY MOUTH EVERYDAY AT BEDTIME (Patient taking differently: Take 300 mg by mouth at bedtime. TAKE 1 TABLET BY MOUTH EVERYDAY AT BEDTIME) 90 tablet 1  . rizatriptan (MAXALT-MLT) 10 MG disintegrating tablet Take 1 tablet (10 mg total) by mouth as needed for migraine. May repeat in 2  hours if needed 30 tablet 3    No results found for this or any previous visit (from the past 48 hour(s)). No results found.  Review of Systems  All other systems reviewed and are negative.   Blood pressure 127/86, pulse (!) 104, temperature 97.7 F (36.5 C), temperature source Oral, resp. rate 16, height 5\' 3"  (1.6 m), weight 86.2 kg, SpO2 97 %. Physical Exam HENT:     Head: Atraumatic.  Eyes:     Extraocular Movements: Extraocular movements intact.  Cardiovascular:     Pulses: Normal pulses.  Pulmonary:     Effort: Pulmonary effort is normal.  Musculoskeletal:     Comments: L shoulder pain with limited ROM  Neurological:     Mental Status: She is alert.      Assessment/Plan L shoulder rotator cuff tear arthropathy Plan L reverse TSA Risks / benefits of surgery discussed Consent on chart  NPO for OR Preop antibiotics   Berline Lopes, MD 07/03/2020, 7:10 AM

## 2020-07-03 NOTE — Anesthesia Procedure Notes (Signed)
Procedure Name: Intubation Performed by: Sudie Grumbling, CRNA Pre-anesthesia Checklist: Patient identified, Emergency Drugs available, Suction available and Patient being monitored Patient Re-evaluated:Patient Re-evaluated prior to induction Oxygen Delivery Method: Circle system utilized Preoxygenation: Pre-oxygenation with 100% oxygen Induction Type: IV induction Ventilation: Mask ventilation without difficulty Laryngoscope Size: Miller and 2 Grade View: Grade I Tube type: Oral Number of attempts: 1 Airway Equipment and Method: Stylet and Oral airway Placement Confirmation: ETT inserted through vocal cords under direct vision,  positive ETCO2 and breath sounds checked- equal and bilateral Secured at: 21 cm Tube secured with: Tape Dental Injury: Teeth and Oropharynx as per pre-operative assessment

## 2020-07-03 NOTE — Transfer of Care (Signed)
Immediate Anesthesia Transfer of Care Note  Patient: Jennifer Davidson  Procedure(s) Performed: REVERSE SHOULDER ARTHROPLASTY (Left Shoulder)  Patient Location: PACU  Anesthesia Type:General  Level of Consciousness: awake, alert  and oriented  Airway & Oxygen Therapy: Patient Spontanous Breathing and Patient connected to face mask  Post-op Assessment: Report given to RN and Post -op Vital signs reviewed and stable  Post vital signs: Reviewed and stable  Last Vitals:  Vitals Value Taken Time  BP 131/75 07/03/20 0900  Temp 36.4 C 07/03/20 0900  Pulse 82 07/03/20 0907  Resp 15 07/03/20 0907  SpO2 100 % 07/03/20 0907  Vitals shown include unvalidated device data.  Last Pain:  Vitals:   07/03/20 0900  TempSrc:   PainSc: Asleep      Patients Stated Pain Goal: 8 (07/03/20 0559)  Complications: No complications documented.

## 2020-07-04 ENCOUNTER — Other Ambulatory Visit: Payer: Self-pay | Admitting: Family Medicine

## 2020-07-04 ENCOUNTER — Encounter (HOSPITAL_COMMUNITY): Payer: Self-pay | Admitting: Orthopedic Surgery

## 2020-07-04 NOTE — Telephone Encounter (Signed)
  Notes to clinic: Alternative Requested:NOT ON INS FORMULARY. INS WASNT PANTOPRAZOLE   Requested Prescriptions  Pending Prescriptions Disp Refills   esomeprazole (NEXIUM) 40 MG capsule [Pharmacy Med Name: ESOMEPRAZOLE MAG DR 40 MG CAP] 90 capsule 3    Sig: Take 1 capsule (40 mg total) by mouth daily.      Gastroenterology: Proton Pump Inhibitors Passed - 07/04/2020  9:01 AM      Passed - Valid encounter within last 12 months    Recent Outpatient Visits           1 week ago Gastroesophageal reflux disease without esophagitis   [facility] [place] [facility], Azalee Course, FNP   1 month ago Benign paroxysmal positional vertigo, unspecified laterality   [facility] [place] [facility], Azalee Course, FNP   1 month ago Insomnia due to other mental disorder   [facility] [place] [facility], Azalee Course, FNP   4 months ago Migraine with aura and without status migrainosus, not intractable   [facility], Meda Coffee, MD   7 months ago Insomnia due to other mental disorder   [facility] at [facility], Meda Coffee, MD       Future Appointments             In 1 month Just, Azalee Course, FNP [facility] [place], [facility]

## 2020-07-08 ENCOUNTER — Encounter (HOSPITAL_COMMUNITY): Payer: Self-pay | Admitting: Psychiatry

## 2020-07-08 ENCOUNTER — Telehealth (INDEPENDENT_AMBULATORY_CARE_PROVIDER_SITE_OTHER): Payer: Medicare Other | Admitting: Psychiatry

## 2020-07-08 DIAGNOSIS — F330 Major depressive disorder, recurrent, mild: Secondary | ICD-10-CM | POA: Diagnosis not present

## 2020-07-08 DIAGNOSIS — F4312 Post-traumatic stress disorder, chronic: Secondary | ICD-10-CM

## 2020-07-08 DIAGNOSIS — I2510 Atherosclerotic heart disease of native coronary artery without angina pectoris: Secondary | ICD-10-CM

## 2020-07-08 DIAGNOSIS — F411 Generalized anxiety disorder: Secondary | ICD-10-CM

## 2020-07-08 DIAGNOSIS — I2583 Coronary atherosclerosis due to lipid rich plaque: Secondary | ICD-10-CM | POA: Diagnosis not present
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
## 2020-07-10 DIAGNOSIS — H903 Sensorineural hearing loss, bilateral: Secondary | ICD-10-CM | POA: Diagnosis not present

## 2020-07-10 DIAGNOSIS — R42 Dizziness and giddiness: Secondary | ICD-10-CM | POA: Diagnosis not present

## 2020-07-10 DIAGNOSIS — H838X3 Other specified diseases of inner ear, bilateral: Secondary | ICD-10-CM | POA: Diagnosis not present

## 2020-07-15 ENCOUNTER — Other Ambulatory Visit: Payer: Self-pay | Admitting: Cardiology

## 2020-07-15 MED ORDER — ICOSAPENT ETHYL 1 G PO CAPS: 2.0000 g | ORAL_CAPSULE | Freq: Two times a day (BID) | ORAL | 3 refills | Status: DC

## 2020-07-16 ENCOUNTER — Other Ambulatory Visit: Payer: Self-pay | Admitting: Neurology

## 2020-07-16 DIAGNOSIS — Z471 Aftercare following joint replacement surgery: Secondary | ICD-10-CM | POA: Diagnosis not present

## 2020-07-16 DIAGNOSIS — Z96612 Presence of left artificial shoulder joint: Secondary | ICD-10-CM | POA: Diagnosis not present

## 2020-07-18 ENCOUNTER — Other Ambulatory Visit: Payer: Self-pay | Admitting: Family Medicine

## 2020-07-18 DIAGNOSIS — R062 Wheezing: Secondary | ICD-10-CM

## 2020-07-24 ENCOUNTER — Other Ambulatory Visit: Payer: Self-pay

## 2020-07-24 ENCOUNTER — Other Ambulatory Visit (HOSPITAL_BASED_OUTPATIENT_CLINIC_OR_DEPARTMENT_OTHER)
Admission: RE | Admit: 2020-07-24 | Discharge: 2020-07-24 | Disposition: A | Discharge status: Home / Self Care | Payer: Medicare Other | Source: Ambulatory Visit | Attending: Nurse Practitioner | Admitting: Nurse Practitioner

## 2020-07-24 ENCOUNTER — Ambulatory Visit (INDEPENDENT_AMBULATORY_CARE_PROVIDER_SITE_OTHER): Payer: Medicare Other | Admitting: Nurse Practitioner

## 2020-07-24 ENCOUNTER — Encounter (HOSPITAL_BASED_OUTPATIENT_CLINIC_OR_DEPARTMENT_OTHER): Payer: Self-pay | Admitting: Nurse Practitioner

## 2020-07-24 ENCOUNTER — Other Ambulatory Visit (HOSPITAL_BASED_OUTPATIENT_CLINIC_OR_DEPARTMENT_OTHER): Payer: Self-pay

## 2020-07-24 VITALS — BP 90/68 | HR 111 | Ht 63.0 in | Wt 189.0 lb

## 2020-07-24 DIAGNOSIS — E782 Mixed hyperlipidemia: Secondary | ICD-10-CM | POA: Diagnosis not present

## 2020-07-24 DIAGNOSIS — K219 Gastro-esophageal reflux disease without esophagitis: Secondary | ICD-10-CM

## 2020-07-24 DIAGNOSIS — R7303 Prediabetes: Secondary | ICD-10-CM

## 2020-07-24 DIAGNOSIS — I5032 Chronic diastolic (congestive) heart failure: Secondary | ICD-10-CM

## 2020-07-24 DIAGNOSIS — G5791 Unspecified mononeuropathy of right lower limb: Secondary | ICD-10-CM

## 2020-07-24 DIAGNOSIS — Z7689 Persons encountering health services in other specified circumstances: Secondary | ICD-10-CM | POA: Diagnosis not present

## 2020-07-24 DIAGNOSIS — F339 Major depressive disorder, recurrent, unspecified: Secondary | ICD-10-CM | POA: Diagnosis not present

## 2020-07-24 DIAGNOSIS — T7840XD Allergy, unspecified, subsequent encounter: Secondary | ICD-10-CM

## 2020-07-24 DIAGNOSIS — N182 Chronic kidney disease, stage 2 (mild): Secondary | ICD-10-CM | POA: Diagnosis not present

## 2020-07-24 DIAGNOSIS — F4310 Post-traumatic stress disorder, unspecified: Secondary | ICD-10-CM | POA: Diagnosis not present

## 2020-07-24 DIAGNOSIS — E038 Other specified hypothyroidism: Secondary | ICD-10-CM | POA: Diagnosis not present

## 2020-07-24 DIAGNOSIS — M12812 Other specific arthropathies, not elsewhere classified, left shoulder: Secondary | ICD-10-CM

## 2020-07-24 DIAGNOSIS — K580 Irritable bowel syndrome with diarrhea: Secondary | ICD-10-CM

## 2020-07-24 DIAGNOSIS — F411 Generalized anxiety disorder: Secondary | ICD-10-CM | POA: Diagnosis not present

## 2020-07-24 DIAGNOSIS — G43709 Chronic migraine without aura, not intractable, without status migrainosus: Secondary | ICD-10-CM

## 2020-07-24 DIAGNOSIS — M75102 Unspecified rotator cuff tear or rupture of left shoulder, not specified as traumatic: Secondary | ICD-10-CM

## 2020-07-24 LAB — CBC WITH DIFFERENTIAL/PLATELET
Abs Immature Granulocytes: 0.11 10*3/uL — ABNORMAL HIGH (ref 0.00–0.07)
Basophils Absolute: 0.1 10*3/uL (ref 0.0–0.1)
Basophils Relative: 0 %
Eosinophils Absolute: 0.3 10*3/uL (ref 0.0–0.5)
Eosinophils Relative: 3 %
HCT: 37.1 % (ref 36.0–46.0)
Hemoglobin: 11.6 g/dL — ABNORMAL LOW (ref 12.0–15.0)
Immature Granulocytes: 1 %
Lymphocytes Relative: 24 %
Lymphs Abs: 2.9 10*3/uL (ref 0.7–4.0)
MCH: 26.4 pg (ref 26.0–34.0)
MCHC: 31.3 g/dL (ref 30.0–36.0)
MCV: 84.5 fL (ref 80.0–100.0)
Monocytes Absolute: 0.8 10*3/uL (ref 0.1–1.0)
Monocytes Relative: 7 %
Neutro Abs: 7.6 10*3/uL (ref 1.7–7.7)
Neutrophils Relative %: 65 %
Platelets: 369 10*3/uL (ref 150–400)
RBC: 4.39 MIL/uL (ref 3.87–5.11)
RDW: 17.2 % — ABNORMAL HIGH (ref 11.5–15.5)
WBC: 11.7 10*3/uL — ABNORMAL HIGH (ref 4.0–10.5)
nRBC: 0 % (ref 0.0–0.2)

## 2020-07-24 LAB — LIPID PANEL
Cholesterol: 171 mg/dL (ref 0–200)
HDL: 60 mg/dL (ref >40)
LDL Cholesterol: 80 mg/dL (ref 0–99)
Total CHOL/HDL Ratio: 2.9 RATIO
Triglycerides: 153 mg/dL — ABNORMAL HIGH (ref <150)
VLDL: 31 mg/dL (ref 0–40)

## 2020-07-24 LAB — COMPREHENSIVE METABOLIC PANEL
ALT: 12 U/L (ref 0–44)
AST: 13 U/L — ABNORMAL LOW (ref 15–41)
Albumin: 4.4 g/dL (ref 3.5–5.0)
Alkaline Phosphatase: 83 U/L (ref 38–126)
Anion gap: 12 (ref 5–15)
BUN: 25 mg/dL — ABNORMAL HIGH (ref 8–23)
CO2: 28 mmol/L (ref 22–32)
Calcium: 9.8 mg/dL (ref 8.9–10.3)
Chloride: 100 mmol/L (ref 98–111)
Creatinine, Ser: 1.55 mg/dL — ABNORMAL HIGH (ref 0.44–1.00)
GFR, Estimated: 37 mL/min — ABNORMAL LOW (ref >60)
Glucose, Bld: 112 mg/dL — ABNORMAL HIGH (ref 70–99)
Potassium: 4.4 mmol/L (ref 3.5–5.1)
Sodium: 140 mmol/L (ref 135–145)
Total Bilirubin: 0.5 mg/dL (ref 0.3–1.2)
Total Protein: 7.3 g/dL (ref 6.5–8.1)

## 2020-07-24 LAB — HEMOGLOBIN A1C
Hgb A1c MFr Bld: 5.7 % — ABNORMAL HIGH (ref 4.8–5.6)
Mean Plasma Glucose: 116.89 mg/dL

## 2020-07-24 LAB — TSH: TSH: 0.451 u[IU]/mL (ref 0.350–4.500)

## 2020-07-24 MED ORDER — HYDROXYZINE HCL 50 MG PO TABS: 50.0000 mg | ORAL_TABLET | Freq: Four times a day (QID) | ORAL | 2 refills | Status: DC | PRN

## 2020-07-24 MED ORDER — ONDANSETRON 4 MG PO TBDP: 4.0000 mg | ORAL_TABLET | Freq: Three times a day (TID) | ORAL | 3 refills | Status: DC | PRN

## 2020-07-24 NOTE — Patient Instructions (Signed)
Thank you for choosing [facility] at [facility] for your Primary Care needs. I am excited for the opportunity to partner with you to meet your health care goals. It was a pleasure meeting you today.   For your information, our office hours are Monday- Friday 8:00 AM - 5:00 PM At this time I am not in the office on Wednesdays.  If you have any needs, please feel free to send a MyChart message or call our office at [phone] and we will respond as quickly as possible.   If you had labs drawn today for your visit, you will receive notification on MyChart as soon as the labs have resulted. You will see these results before me. I ask that you please allow 72 business hours for me to view the results. I will send you a MyChart message with lab results and if there are abnormalities that we need to discuss, you will receive a phone call from the office. If you have not heard anything within 72 hours through MyChart or a phone call, please feel free to contact the office to let us know.   I typically respond to MyChart messages within 24 hours during the work week.  Most messages must be routed to me even when the message is selected directly for me. This may cause a delay in my response. Your needs are very important to me, if you need a faster response, please feel free to call the office and a message will be directed to me.  Due to the routing process, messages sent over the weekend may not get to me until Monday.   If you have non-urgent medical needs after hours, please call the office line and speak to our on-call provider. We share on-call services with another [facility] Primary Care office, therefore, you may receive a return call from a provider outside of this office.   If you have urgent medical needs after hours, please seek care in an Urgent Care or Emergency Room. Our building has a 24 hour emergency department on the 1st floor for your convenience.   Please do not  hesitate to reach out to [place] with concerns.   Thank you, again, for choosing me as your health care partner. I appreciate your trust and look forward to learning more about you.   Shawna Clamp, DNP, AGNP-c  _____________________________________________________________________________   Bonita Quin are due for your annual medicare wellness visit- this can be done virtually or in person, whichever is more convenient for you. We can schedule this before you leave today.   We will update your records to show that you have received your pneumonia vaccine.   We will plan to get labs for: CBC- to check blood counts and white blood cells CMP- to check liver and kidney function, electrolytes, and blood sugar Hemoglobin A1c- to check for diabetes  TSH- to monitor your thyroid levels Lipids- to monitor your cholesterol levels   You are already scheduled for your bone density test on 11/03/2020 You are already scheduled for your mammogram on 07/31/2020  ______________________________________________________________________________

## 2020-07-24 NOTE — Assessment & Plan Note (Signed)
Alternating doses of levothyroxine and .  Will draw labs today to evaluate thyroid function and make changes to the plan of care as needed.  No refills needed today.

## 2020-07-24 NOTE — Assessment & Plan Note (Signed)
Will obtain A1c today for further evaluation. Will make changes to the plan of care, if needed, based on lab results.  Continue metformin 500mg  daily.  Plan to send in prescription for glucometer to [facility] pharmacy for patient to have.  Will need to have patient bring glucometer to her next appointment to go over how to properly use the device and recommended monitoring.

## 2020-07-24 NOTE — Assessment & Plan Note (Addendum)
Migraine headaches well controlled with monthly aimovig and as needed rizatriptan.  Will check into patient assistance for aimovig as this is quite expensive for her and she is concerned she will have to stop the medication, which will have a negative impact on her health.  Followed by neurology.  Refills on zofran for nausea associated with migraine headaches sent to [facility] pharmacy today.

## 2020-07-24 NOTE — Assessment & Plan Note (Signed)
>>  ASSESSMENT AND PLAN FOR CHRONIC KIDNEY DISEASE WRITTEN ON 07/24/2020 10:45 AM BY Sumire Halbleib E, NP  Will review all medication dosages to ensure that all are appropriately renally dosed.  Will obtain labs today to monitor kidney function. She has recovered from her recent urosepsis without concern at this time.  She is followed by [facility] and has an appointment next week. Instructed the patient to give the doctor her lab results to review so duplicate labs do not have to be drawn.

## 2020-07-24 NOTE — Assessment & Plan Note (Signed)
Managed by Dr. Gilmore Laroche. No concerns today. Will continue medications or discussed with patient that Dr. Gilmore Laroche may take over management if she prefers.

## 2020-07-24 NOTE — Assessment & Plan Note (Signed)
Currently well controlled with daily esomeprazole and intermittent PRN famotidine.  No concerns today.

## 2020-07-24 NOTE — Assessment & Plan Note (Signed)
No dyspnea, shortness of breath, lower extremity edema, or crackles noted today. No current medications. This does not appear to be a current medical concern. Will resolve this problem and add to medical history.  Patient is followed by cardiology.  Discussed warning signs that would warrant re-evaluation.

## 2020-07-24 NOTE — Assessment & Plan Note (Signed)
She is followed by Dr. Gilmore Laroche with psychiatry and awaiting to get in with a therapist. Encouraged patient to reach out to me if she would like me to place a referral for our therapist in the office.  She is well controlled on her current medication regimen with no concerns today.  I am happy to continue her medications or discussed that psychiatry can take over management if they prefer.

## 2020-07-24 NOTE — Assessment & Plan Note (Signed)
Cutting back dosing of gabapentin due to kidney function. Reportedly tolerating this well. She is followed by neurology. No concerns today.  No refills needed today.  Will obtain Hgb A1c for close monitoring of pre-diabetes to help prevent further damage.

## 2020-07-24 NOTE — Assessment & Plan Note (Addendum)
>>  ASSESSMENT AND PLAN FOR MOOD DISORDER (HCC) WRITTEN ON 09/21/2023  5:42 PM BY Mai Longnecker E, NP   >>ASSESSMENT AND PLAN FOR MAJOR DEPRESSION, RECURRENT, CHRONIC (HCC) WRITTEN ON 07/24/2020 10:49 AM BY Refugio Vandevoorde E, NP  She is followed by Dr. Aram Knights with psychiatry and awaiting to get in with a therapist. Encouraged patient to reach out to me if she would like me to place a referral for our therapist in the office.  She is well controlled on her current medication regimen with no concerns today.  I am happy to continue her medications or discussed that psychiatry can take over management if they prefer.    >>ASSESSMENT AND PLAN FOR GENERALIZED ANXIETY DISORDER WRITTEN ON 07/24/2020 10:48 AM BY Daneka Lantigua E, NP  She is followed by Dr. Aram Knights with psychiatry and awaiting to get in with a therapist. Encouraged patient to reach out to me if she would like me to place a referral for our therapist in the office.  She is well controlled on her current medication regimen with no concerns today.  Will send in refill for hydroxyzine  50mg  to [facility] pharmacy today.  Patient will notify if additional refills are needed on her medications. I am happy to continue these medications or discussed that psychiatry can take over management if they prefer.

## 2020-07-24 NOTE — Assessment & Plan Note (Signed)
Will review all medication dosages to ensure that all are appropriately renally dosed.  Will obtain labs today to monitor kidney function. She has recovered from her recent urosepsis without concern at this time.  She is followed by [facility] and has an appointment next week. Instructed the patient to give the doctor her lab results to review so duplicate labs do not have to be drawn.

## 2020-07-24 NOTE — Progress Notes (Signed)
New Patient Office Visit  Subjective:  Patient ID: Jennifer Davidson, female    DOB: 21-Nov-1955  Age: 65 y.o. MRN: [id number]  CC:  Chief Complaint  Patient presents with  . Establish Care    HPI Jennifer Davidson is a pleasant 65 year old female who presents today to establish care as a new patient to this practice.   Jennifer Davidson is married to [place] and they have three adult children ad 4 grandchildren, 2 boys and 2 girls, ages 29, 45, 96, and 86. She is close to her family and active in her grandchildren's lives. She and her husband have dachshund's as pets.  She and her husband are planning a trip to [place] in June and are taking their grandchildren with them.   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   Jennifer Davidson has a medical history of:  PTSD, Anxiety, MDD and is seen by Dr. Gilmore Laroche for psychiatry. She is not currently in counseling, but they are working on getting her in with a counselor at this time. Her medications have historically been managed by her PCP and currently include bupropion XL  qAM, buspirone 7.5mg  TID, duloxetine  qHS, hydroxyzine  q6h PRN, quetiapine  qHS, and prazosin  qHS. She reports that her symptoms are currently well controlled and she has no concerns today.    Syncopal episodes of unknown origin, Allergy, Vertigo and is seen by Dr. Janeece Riggers Philomena Doheny with ENT. She has an appointment coming up in the near future. She is not currently taking any medications for this. She has been on meclizine in the past for vertigo episodes.   Pre-Diabetes which is managed by PCP. She is currently taking metformin  qAM. She does not have a glucometer and does not check her blood sugars. She is interested in obtaining a glucometer so that she can keep better track of her blood sugars.   CKD which is managed by Dr. Salena Saner with [facility]. She reports that her medications have been renally dosed. She denies any current problems or issues. She  has an appointment coming up with him next week.   CHF (reports this is resolved), tachycardia and is managed by Dr. Mayford Knife. She denies any concerns today.   OSA (on CPAP nightly) which is managed by her PCP. She reports excellent compliance with her machine. She endorses some daytime sleepiness and tells me that her husband notices some periods of apnea during the night, even with the CPAP. She is unsure if her PCP was downloading reports from her machine- I will look into this to see if we need to make adjustments based on her ongoing symptoms.   Migraine Headaches, Neuropathy of the lower extremities which is managed by Dr. Everlena Cooper with neurology. She is currently taking aimovig  injection every 28 days, rizatriptan  PRN, and gabapentin  TID (tapering back due to kidney function). She tells me the aimovig has significantly helping reduce the number of migraines that she has had and that she is now currently only experiencing a migraine headache about once every two weeks. She does have concerns about the cost of the aimovig as it is quite expensive for her to continue.   HLD which is managed by her PCP. She is taking atorvastatin  qHS. She has no concerns today.   GERD which is managed by her PCP. She is taking esomeprazole  qAM and famotidine PRN for breakthrough symptoms. She reports her symptoms are well controlled with  this medication regimen.   Hypothyroidism which is managed by her PCP. She is currently taking levothyroxine and alternating doses every other morning. She reports this regimen is working well for her and she denies any current symptoms.   Recent shoulder arthoplasty of the left shoulder on 07/03/2020 by Dr. Ave Filter with orthopedic surgery. She reports she is doing well with this and is able to move her shoulder more than she has in over three years.  Recent hospitalization for urosepsis in December 2021. She reports that this has resolved and she  has not had any residual issues.  Back surgery in 2011.  She is also seen by the following specialists: Dr. Myrtie Neither, gastroenterology Dr. Nile Riggs, ophthalmology  Past Medical History:  Diagnosis Date  . Acute cystitis without hematuria   . Acute encephalopathy 06/25/2016  . Altered mental status 06/23/2017  . Anxiety   . Anxiety and depression 02/04/2012  . Arthritis   . Cataract 08/31/2017   left eye  . CHF (congestive heart failure) (HCC)   . Cholesterol serum increased   . Chronic back pain    chronic Rt low back pain. s/p L4-5 fusion. failed Rt facet injections. poss due to Rt SI joint dysfunction.  . Chronic kidney disease   . Chronic pain of right lower extremity 05/14/2015  . Community acquired pneumonia of left lower lobe of lung 09/25/2018  . Complication of anesthesia   . 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  . Depression   . Diastolic heart failure (HCC)   . Diastolic heart failure (HCC) 03/17/2012  . Edema 02/04/2012  . Encounter for long-term (current) use of medications 02/04/2012  . Fall as cause of accidental injury at home as place of occurrence 09/08/2018  . GERD (gastroesophageal reflux disease)   . GERD (gastroesophageal reflux disease) 11/13/2015  . Hallucination, visual 09/14/2017  . Head injury 09/08/2018  . Hyperlipidemia   . Hyperparathyroidism (HCC)   . Hyperparathyroidism, 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   [facility] Irena Cords, MD  Plan:  25 Hydroxy Vit D (59.1 ng/ml)   . Hypertension   . Hypothyroidism   . Lower back pain 09/21/2013   [facility]. Estill Bamberg, MD. 08/31/2013 L4-5 fusion. Right sacroiliac joint dysfunction may very well explain current symptoms.   . Migraine   . Moderate episode of recurrent major depressive disorder (HCC) 08/31/2016  . Neuromuscular disorder (HCC)   . PONV (postoperative nausea and vomiting)   . Pre-diabetes   . Recurrent UTI 06/25/2016  .  Seizures (HCC)    3 years ago,06/20/19  . Sepsis (HCC) 04/29/2020  . Severe recurrent depression with psychosis (HCC) 06/25/2016  . Severe recurrent major depressive disorder with psychotic features (HCC)   . Sleep apnea    cpap  . Syncope 08/30/2015  . Syncope and collapse 02/12/2012  . Tachycardia   . Tachycardia   . Tachycardia 02/04/2012  . Trochanteric bursitis of both hips 2012   Confirmed on MRI  . URI (upper respiratory infection) 09/27/2018    Past Surgical History:  Procedure Laterality Date  . ABDOMINAL HYSTERECTOMY    . APPENDECTOMY  1986  . BACK SURGERY     L4-5 fusion  . CATARACT EXTRACTION    . KNEE SURGERY    . REVERSE SHOULDER ARTHROPLASTY Left 07/03/2020   Procedure: REVERSE SHOULDER ARTHROPLASTY;  Surgeon: Jones Broom, MD;  Location: [facility];  Service: Orthopedics;  Laterality: Left;  . SHOULDER ARTHROSCOPY WITH SUBACROMIAL  DECOMPRESSION Left 10/20/2018   Procedure: SHOULDER ARTHROSCOPY, ROTATOR CUFF DEBRIDEMENT, GLENOHUMERAL JOINT DEBRIDEMENT, ACROMIOPLASTY, DISTAL CLAVICLE RESECTION;  Surgeon: Jodi GeraldsGraves, John, MD;  Location: [facility];  Service: Orthopedics;  Laterality: Left;    Family History  Problem Relation Age of Onset  . Diabetes Mother   . Heart disease Mother   . Kidney disease Mother   . Thyroid disease Mother   . Heart disease Father   . Seizures Father   . COPD Father   . Irritable bowel syndrome Father   . Cancer Maternal Grandmother   . Diabetes Sister   . Diabetes Brother   . Colon cancer Neg Hx   . Stomach cancer Neg Hx   . Colon polyps Neg Hx   . Esophageal cancer Neg Hx   . Rectal cancer Neg Hx     Social History   Socioeconomic History  . Marital status: Married    Spouse name: Not on file  . Number of children: 3  . Years of education: 4412  . Highest education level: Some college, no degree  Occupational History  . Not on file  Tobacco Use  . Smoking status: Never Smoker  . Smokeless tobacco: Never Used  Vaping Use  . Vaping  Use: Never used  Substance and Sexual Activity  . Alcohol use: No  . Drug use: No    Comment: 08-31-2016 PER PT NO   . Sexual activity: Yes    Partners: Male    Comment: married  Other Topics Concern  . Not on file  Social History Narrative   Right handed   Lives with husband one story   Social Determinants of Health   Financial Resource Strain: Not on file  Food Insecurity: Not on file  Transportation Needs: Not on file  Physical Activity: Not on file  Stress: Not on file  Social Connections: Not on file  Intimate Partner Violence: Not on file    ROS Review of Systems  All review of systems negative except what is listed in the HPI   Objective:   Today's Vitals: There were no vitals taken for this visit.  Physical Exam Vitals and nursing note reviewed.  Constitutional:      Appearance: Normal appearance. She is obese.  HENT:     Head: Normocephalic.     Right Ear: Tympanic membrane, ear canal and external ear normal.     Left Ear: Tympanic membrane, ear canal and external ear normal.  Eyes:     Extraocular Movements: Extraocular movements intact.     Conjunctiva/sclera: Conjunctivae normal.     Pupils: Pupils are equal, round, and reactive to light.  Neck:     Vascular: No carotid bruit.  Cardiovascular:     Rate and Rhythm: Regular rhythm. Tachycardia present.     Pulses: Normal pulses.     Heart sounds: Normal heart sounds.  Pulmonary:     Effort: Pulmonary effort is normal.     Breath sounds: Normal breath sounds.  Abdominal:     General: Bowel sounds are normal. There is no distension.     Palpations: Abdomen is soft.     Tenderness: There is no abdominal tenderness.  Musculoskeletal:        General: Normal range of motion.     Cervical back: Normal range of motion. No tenderness.     Right lower leg: No edema.     Left lower leg: No edema.  Lymphadenopathy:     Cervical: No cervical adenopathy.  Skin:    General: Skin is dry.     Capillary  Refill: Capillary refill takes less than 2 seconds.  Neurological:     General: No focal deficit present.     Mental Status: She is alert and oriented to person, place, and time.     Cranial Nerves: No cranial nerve deficit.     Motor: No weakness.     Coordination: Coordination normal.     Gait: Gait normal.  Psychiatric:        Mood and Affect: Mood normal.        Behavior: Behavior normal.        Thought Content: Thought content normal.        Judgment: Judgment normal.     Assessment & Plan:   Problem List Items Addressed This Visit      Cardiovascular and Mediastinum   RESOLVED: Diastolic heart failure (HCC)    No dyspnea, shortness of breath, lower extremity edema, or crackles noted today. No current medications. This does not appear to be a current medical concern. Will resolve this problem and add to medical history.  Patient is followed by cardiology.  Discussed warning signs that would warrant re-evaluation.       Relevant Orders   Comprehensive metabolic panel   Headache, migraine    Migraine headaches well controlled with monthly aimovig and as needed rizatriptan.  Will check into patient assistance for aimovig as this is quite expensive for her and she is concerned she will have to stop the medication, which will have a negative impact on her health.  Followed by neurology.  Refills on zofran for nausea associated with migraine headaches sent to [facility] pharmacy today.       Relevant Medications   ondansetron (ZOFRAN ODT) 4 MG disintegrating tablet     Digestive   Gastroesophageal reflux disease without esophagitis    Currently well controlled with daily esomeprazole and intermittent PRN famotidine.  No concerns today.       Relevant Medications   ondansetron (ZOFRAN ODT) 4 MG disintegrating tablet   Irritable bowel syndrome with diarrhea    No current concerns today. She is up to date on her colon cancer screening. She is followed by GI.       Relevant  Medications   ondansetron (ZOFRAN ODT) 4 MG disintegrating tablet     Endocrine   Hypothyroid (Chronic)    Alternating doses of levothyroxine and .  Will draw labs today to evaluate thyroid function and make changes to the plan of care as needed.  No refills needed today.       Relevant Orders   TSH     Nervous and Auditory   Neuropathy of lower extremity    Cutting back dosing of gabapentin due to kidney function. Reportedly tolerating this well. She is followed by neurology. No concerns today.  No refills needed today.  Will obtain Hgb A1c for close monitoring of pre-diabetes to help prevent further damage.       Relevant Medications   hydrOXYzine (ATARAX/VISTARIL) 50 MG tablet   Other Relevant Orders   CBC with Differential/Platelet (Completed)     Musculoskeletal and Integument   Rotator cuff tear arthropathy of left shoulder (Chronic)    Left shoulder healing well post surgery last month. She has excellent ROM and use of the arm.         Genitourinary   Chronic kidney disease (Chronic)    Will review all medication dosages to  ensure that all are appropriately renally dosed.  Will obtain labs today to monitor kidney function. She has recovered from her recent urosepsis without concern at this time.  She is followed by [facility] and has an appointment next week. Instructed the patient to give the doctor her lab results to review so duplicate labs do not have to be drawn.        Relevant Orders   Comprehensive metabolic panel   CBC with Differential/Platelet (Completed)     Other   Major depression, recurrent, chronic (HCC) (Chronic)    She is followed by Dr. Gilmore Laroche with psychiatry and awaiting to get in with a therapist. Encouraged patient to reach out to me if she would like me to place a referral for our therapist in the office.  She is well controlled on her current medication regimen with no concerns today.  I am happy to continue her medications  or discussed that psychiatry can take over management if they prefer.       Relevant Medications   hydrOXYzine (ATARAX/VISTARIL) 50 MG tablet   Other Relevant Orders   CBC with Differential/Platelet (Completed)   Mixed hyperlipidemia    Will obtain labs today for evaluation of current status.  Last labs were excellent.  Continue atorvastatin  qHS.       Relevant Orders   Lipid panel   Generalized anxiety disorder    She is followed by Dr. Gilmore Laroche with psychiatry and awaiting to get in with a therapist. Encouraged patient to reach out to me if she would like me to place a referral for our therapist in the office.  She is well controlled on her current medication regimen with no concerns today.  Will send in refill for hydroxyzine  to [facility] pharmacy today.  Patient will notify if additional refills are needed on her medications. I am happy to continue these medications or discussed that psychiatry can take over management if they prefer.       Relevant Medications   hydrOXYzine (ATARAX/VISTARIL) 50 MG tablet   Other Relevant Orders   TSH   PTSD (post-traumatic stress disorder)    Managed by Dr. Gilmore Laroche. No concerns today. Will continue medications or discussed with patient that Dr. Gilmore Laroche may take over management if she prefers.       Relevant Medications   hydrOXYzine (ATARAX/VISTARIL) 50 MG tablet   Prediabetes    Will obtain A1c today for further evaluation. Will make changes to the plan of care, if needed, based on lab results.  Continue metformin  daily.  Plan to send in prescription for glucometer to [facility] pharmacy for patient to have.  Will need to have patient bring glucometer to her next appointment to go over how to properly use the device and recommended monitoring.        Relevant Orders   Comprehensive metabolic panel   Hemoglobin A1c   Allergies    Currently symptoms appear to be fairly well controlled. It is unclear if her chronic allergy symptoms  are causing her episodes of vertigo. She is scheduled to see the ENT in the near future for further evaluation.  No changes to the plan of care at this time.        Other Visit Diagnoses    Encounter to establish care    -  Primary   Relevant Orders   Comprehensive metabolic panel   Lipid panel   TSH   Hemoglobin A1c   CBC with Differential/Platelet (Completed)  Outpatient Encounter Medications as of 07/24/2020  Medication Sig  . AIMOVIG 70 MG/ML SOAJ INJECT 70 MG INTO THE SKIN EVERY 28 (TWENTY-EIGHT) DAYS  . albuterol (VENTOLIN HFA) 108 (90 Base) MCG/ACT inhaler TAKE 2 PUFFS BY MOUTH EVERY 6 HOURS AS NEEDED FOR WHEEZE OR SHORTNESS OF BREATH  . aspirin EC 81 MG tablet Take 1 tablet (81 mg total) by mouth daily.  Marland Kitchen atorvastatin (LIPITOR) 20 MG tablet TAKE 1 TABLET BY MOUTH EVERY DAY (Patient taking differently: Take 20 mg by mouth daily.)  . buPROPion (WELLBUTRIN XL) 150 MG 24 hr tablet Take 1 tablet (150 mg total) by mouth daily.  . busPIRone (BUSPAR) 7.5 MG tablet Take 1 tablet (7.5 mg total) by mouth 3 (three) times daily.  . cetirizine (ZYRTEC) 10 MG tablet Take 1 tablet (10 mg total) by mouth at bedtime.  . diclofenac sodium (VOLTAREN) 1 % GEL Apply 2 g topically daily as needed (for knee pain).  . DULoxetine (CYMBALTA) 60 MG capsule TAKE 1 CAPSULE EVERY DAY (Patient taking differently: Take 60 mg by mouth daily.)  . esomeprazole (NEXIUM) 40 MG capsule TAKE 1 CAPSULE (40 MG TOTAL) BY MOUTH DAILY.  . famotidine (PEPCID) 20 MG tablet Take 1 tablet (20 mg total) by mouth daily as needed for heartburn or indigestion.  . fluticasone (FLONASE) 50 MCG/ACT nasal spray PLACE 2 SPRAYS AT BEDTIME INTO BOTH NOSTRILS. (Patient taking differently: Place 2 sprays into both nostrils at bedtime. PLACE 2 SPRAYS AT BEDTIME INTO BOTH NOSTRILS.)  . gabapentin (NEURONTIN) 600 MG tablet Take 1 tablet (600 mg total) by mouth 3 (three) times daily. (Patient taking differently: Take 900 mg by mouth at  bedtime.)  . icosapent Ethyl (VASCEPA) 1 g capsule Take 2 capsules (2 g total) by mouth 2 (two) times daily.  Marland Kitchen levothyroxine (SYNTHROID) 50 MCG tablet TAKE 1 TABLET BY MOUTH EVERY OTHER DAY. BEFORE BREAKFAST, ALTERNATED WITH TABLET. (Patient taking differently: Take 50 mcg by mouth every other day. Before breakfast)  . levothyroxine (SYNTHROID) 75 MCG tablet Take 1 tablet (75 mcg total) by mouth every other day. Before breakfast  . meclizine (ANTIVERT) 50 MG tablet Take 0.5 tablets (25 mg total) by mouth 2 (two) times daily as needed for dizziness.  . metFORMIN (GLUCOPHAGE) 500 MG tablet Take 1 tablet (500 mg total) by mouth daily with breakfast.  . Multiple Vitamins-Minerals (MULTIVITAMIN WITH MINERALS) tablet Take 1 tablet by mouth daily.  . prazosin (MINIPRESS) 5 MG capsule Take 2-3 capsules (10-15 mg total) by mouth at bedtime. TAKE 2 TO 3 CAPSULES AT BEDTIME  . Probiotic Product (ALIGN) 4 MG CAPS Take 1 capsule (4 mg total) by mouth daily.  . QUEtiapine (SEROQUEL) 300 MG tablet TAKE 1 TABLET BY MOUTH EVERYDAY AT BEDTIME (Patient taking differently: Take 300 mg by mouth at bedtime. TAKE 1 TABLET BY MOUTH EVERYDAY AT BEDTIME)  . rizatriptan (MAXALT-MLT) 10 MG disintegrating tablet Take 1 tablet (10 mg total) by mouth as needed for migraine. May repeat in 2 hours if needed  . tiZANidine (ZANAFLEX) 4 MG tablet Take 1 tablet (4 mg total) by mouth every 8 (eight) hours as needed for muscle spasms.  . [DISCONTINUED] hydrOXYzine (ATARAX/VISTARIL) 50 MG tablet TAKE 1 TABLET EVERY 6 HOURS AS NEEDED (Patient taking differently: Take 50 mg by mouth every 6 (six) hours as needed for anxiety.)  . [DISCONTINUED] ondansetron (ZOFRAN ODT) 4 MG disintegrating tablet Take 1 tablet (4 mg total) by mouth every 8 (eight) hours as needed for nausea or  vomiting.  . hydrOXYzine (ATARAX/VISTARIL) 50 MG tablet Take 1 tablet (50 mg total) by mouth every 6 (six) hours as needed.  . ondansetron (ZOFRAN ODT) 4 MG  disintegrating tablet Take 1 tablet (4 mg total) by mouth every 8 (eight) hours as needed for nausea or vomiting.  . [DISCONTINUED] oxyCODONE-acetaminophen (PERCOCET) 5-325 MG tablet Take 1-2 tablets every 4 hours as needed for post operative pain. MAX 6/day (Patient not taking: Reported on 07/24/2020)   No facility-administered encounter medications on file as of 07/24/2020.    Follow-up: Return in about 4 weeks (around 08/21/2020) for Annual medicare wellness- virtual or in person.   Tollie Eth, NP

## 2020-07-24 NOTE — Assessment & Plan Note (Signed)
Will obtain labs today for evaluation of current status.  Last labs were excellent.  Continue atorvastatin 20mg  qHS.

## 2020-07-24 NOTE — Assessment & Plan Note (Signed)
She is followed by Dr. Gilmore Laroche with psychiatry and awaiting to get in with a therapist. Encouraged patient to reach out to me if she would like me to place a referral for our therapist in the office.  She is well controlled on her current medication regimen with no concerns today.  Will send in refill for hydroxyzine 50mg  to [facility] pharmacy today.  Patient will notify if additional refills are needed on her medications. I am happy to continue these medications or discussed that psychiatry can take over management if they prefer.

## 2020-07-24 NOTE — Assessment & Plan Note (Signed)
No current concerns today. She is up to date on her colon cancer screening. She is followed by GI.

## 2020-07-24 NOTE — Assessment & Plan Note (Signed)
Currently symptoms appear to be fairly well controlled. It is unclear if her chronic allergy symptoms are causing her episodes of vertigo. She is scheduled to see the ENT in the near future for further evaluation.  No changes to the plan of care at this time.

## 2020-07-24 NOTE — Assessment & Plan Note (Signed)
Left shoulder healing well post surgery last month. She has excellent ROM and use of the arm.

## 2020-07-28 ENCOUNTER — Telehealth (HOSPITAL_BASED_OUTPATIENT_CLINIC_OR_DEPARTMENT_OTHER): Payer: Self-pay

## 2020-07-28 ENCOUNTER — Other Ambulatory Visit (HOSPITAL_BASED_OUTPATIENT_CLINIC_OR_DEPARTMENT_OTHER): Payer: Self-pay | Admitting: Nurse Practitioner

## 2020-07-28 DIAGNOSIS — E039 Hypothyroidism, unspecified: Secondary | ICD-10-CM | POA: Diagnosis not present

## 2020-07-28 DIAGNOSIS — Z9989 Dependence on other enabling machines and devices: Secondary | ICD-10-CM | POA: Diagnosis not present

## 2020-07-28 DIAGNOSIS — G709 Myoneural disorder, unspecified: Secondary | ICD-10-CM | POA: Diagnosis not present

## 2020-07-28 DIAGNOSIS — G4733 Obstructive sleep apnea (adult) (pediatric): Secondary | ICD-10-CM | POA: Diagnosis not present

## 2020-07-28 DIAGNOSIS — I129 Hypertensive chronic kidney disease with stage 1 through stage 4 chronic kidney disease, or unspecified chronic kidney disease: Secondary | ICD-10-CM | POA: Diagnosis not present

## 2020-07-28 DIAGNOSIS — N1831 Chronic kidney disease, stage 3a: Secondary | ICD-10-CM | POA: Diagnosis not present

## 2020-07-28 DIAGNOSIS — E785 Hyperlipidemia, unspecified: Secondary | ICD-10-CM | POA: Diagnosis not present

## 2020-07-28 DIAGNOSIS — I5030 Unspecified diastolic (congestive) heart failure: Secondary | ICD-10-CM | POA: Diagnosis not present

## 2020-07-28 MED ORDER — BLOOD GLUCOSE MONITOR KIT: PACK | 0 refills | Status: AC

## 2020-07-28 MED ORDER — METFORMIN HCL 500 MG PO TABS: 500.0000 mg | ORAL_TABLET | Freq: Two times a day (BID) | ORAL | 0 refills | Status: DC

## 2020-07-28 NOTE — Telephone Encounter (Signed)
-----   Message from Tollie Eth, NP sent at 07/28/2020  9:32 AM EDT ----- Kidney function fairly stable- will continue to monitor.  Given elevated A1c, recommend starting 500mg  metformin BID and prescription for three time a week blood sugar monitoring and monitoring intermittently as needed. Plan to send to mail order pharmacy if patient is agreeable.  Hopeful this will help with triglycerides and kidneys, as well.   Hemoglobin stable- consistent with anemia of chronic disease in the setting of decreased kidney function. Continue to monitor.

## 2020-07-28 NOTE — Telephone Encounter (Signed)
-----   Message from Sara E Early, NP sent at 07/28/2020  9:32 AM EDT ----- Kidney function fairly stable- will continue to monitor.  Given elevated A1c, recommend starting 500mg metformin BID and prescription for three time a week blood sugar monitoring and monitoring intermittently as needed. Plan to send to mail order pharmacy if patient is agreeable.  Hopeful this will help with triglycerides and kidneys, as well.   Hemoglobin stable- consistent with anemia of chronic disease in the setting of decreased kidney function. Continue to monitor.   

## 2020-07-28 NOTE — Telephone Encounter (Signed)
Pt is aware and agreeable to results and recommendations Updated prescription for metformin 500 mg BID will be sent to [facility] sent to [facility] for glucometer

## 2020-07-28 NOTE — Telephone Encounter (Signed)
Lmtcb for lab results and recommendations Informed patient that results have been released in detail via mychart Instructed patient to call back with questions or concerns.

## 2020-07-28 NOTE — Progress Notes (Signed)
Kidney function fairly stable- will continue to monitor.  Given elevated A1c, recommend starting 500mg  metformin BID and prescription for three time a week blood sugar monitoring and monitoring intermittently as needed. Plan to send to mail order pharmacy if patient is agreeable.  Hopeful this will help with triglycerides and kidneys, as well.   Hemoglobin stable- consistent with anemia of chronic disease in the setting of decreased kidney function. Continue to monitor.

## 2020-07-30 DIAGNOSIS — R42 Dizziness and giddiness: Secondary | ICD-10-CM | POA: Diagnosis not present

## 2020-07-31 ENCOUNTER — Ambulatory Visit: Payer: Medicare Other

## 2020-08-05 ENCOUNTER — Ambulatory Visit: Payer: Medicare Other | Admitting: Emergency Medicine

## 2020-08-06 DIAGNOSIS — R42 Dizziness and giddiness: Secondary | ICD-10-CM | POA: Diagnosis not present

## 2020-08-06 DIAGNOSIS — H9311 Tinnitus, right ear: Secondary | ICD-10-CM | POA: Diagnosis not present

## 2020-08-06 DIAGNOSIS — H903 Sensorineural hearing loss, bilateral: Secondary | ICD-10-CM | POA: Diagnosis not present

## 2020-08-06 DIAGNOSIS — H8101 Meniere's disease, right ear: Secondary | ICD-10-CM | POA: Diagnosis not present

## 2020-08-08 ENCOUNTER — Telehealth: Payer: Self-pay | Admitting: Neurology

## 2020-08-08 NOTE — Telephone Encounter (Signed)
Patient called in stating she tried to pick up her Aimovig shot, but it's too expensive. She would like to see if she can start taking Nurtec instead? She had tried some samples in the past and they worked pretty well. She would like it called in to the [facility] mail delivery pharmacy

## 2020-08-10 NOTE — Telephone Encounter (Signed)
As she has Medicare, then pretty much all of these related medications (Aimovig, Ajovy, Nurtec) will unfortunately be expensive.  I would start zonisamide 25mg  - take 25mg  daily for one week, then 50mg  daily for one week, then 75mg  daily for one week, then 100mg  daily.

## 2020-08-11 NOTE — Telephone Encounter (Signed)
Pt called no answer left a voice mail to call the office back  °

## 2020-08-13 ENCOUNTER — Encounter: Payer: Medicare Other | Admitting: Family Medicine

## 2020-08-14 ENCOUNTER — Telehealth (HOSPITAL_COMMUNITY): Payer: Medicare Other | Admitting: Psychiatry

## 2020-08-18 MED ORDER — ZONISAMIDE 25 MG PO CAPS: ORAL_CAPSULE | ORAL | 0 refills | Status: DC

## 2020-08-18 NOTE — Telephone Encounter (Signed)
My chart message sent to  start zonisamide 25mg  - take 25mg  daily for one week, then 50mg  daily for one week, then 75mg  daily for one week, then 100mg  daily.

## 2020-08-21 ENCOUNTER — Ambulatory Visit (INDEPENDENT_AMBULATORY_CARE_PROVIDER_SITE_OTHER): Payer: Medicare Other | Admitting: Nurse Practitioner

## 2020-08-21 ENCOUNTER — Other Ambulatory Visit: Payer: Self-pay

## 2020-08-21 ENCOUNTER — Encounter (HOSPITAL_BASED_OUTPATIENT_CLINIC_OR_DEPARTMENT_OTHER): Payer: Self-pay | Admitting: Nurse Practitioner

## 2020-08-21 VITALS — BP 109/57 | HR 98 | Ht 63.0 in | Wt 191.0 lb

## 2020-08-21 DIAGNOSIS — Z23 Encounter for immunization: Secondary | ICD-10-CM | POA: Diagnosis not present

## 2020-08-21 DIAGNOSIS — Z0000 Encounter for general adult medical examination without abnormal findings: Secondary | ICD-10-CM | POA: Diagnosis not present

## 2020-08-21 MED ORDER — AMBULATORY NON FORMULARY MEDICATION: 0 refills | Status: DC

## 2020-08-21 NOTE — Patient Instructions (Addendum)
Today was your Annual Medicare Wellness Visit We discussed:   Your goal of walking more up to 20 minutes per day every day increasing as you can.     DEXA (bone density scan) is scheduled for June 20,2022  I will see if we can get the records of the pneumonia vaccine from last December to update our files.   I have give you a prescription for your tetanus booster. You can this to the pharmacy and they will give this to you.   Your next colonoscopy is due in February 2031  I recommend physical activity daily of at least 20 minutes. A walk is a great way to get this exercise. Exercise is beneficial to your whole body and mind and can significantly reduce your risks of illness.   I recommend a diet low in saturated fats, cholesterol, sodium, and carbohydrates. This is best done by incorporating lean meats into your diet with fresh vegetables and limiting sweets, sodas, pasta, cereal, and breads.   Your medications have not changed today.    Jennifer Davidson , Thank you for taking time to come for your Medicare Wellness Visit. I appreciate your ongoing commitment to your health goals. Please review the following plan we discussed and let me know if I can assist you in the future.   These are the goals we discussed: Goals    . Exercise 3x per week (30 min per time)     Walking more starting at 20 minutes per day.    . Weight (lb) < 200 lb (90.7 kg)       This is a list of the screening recommended for you and due dates:  Health Maintenance  Topic Date Due  . DEXA scan (bone density measurement)  Never done  . Pneumonia vaccines (2 of 2 - PPSV23) 02/19/2020  . Tetanus Vaccine  11/15/2020*  .  Hepatitis C: One time screening is recommended by Center for Disease Control  (CDC) for  adults born from [birth year] through 1967.   11/15/2020*  . Flu Shot  12/15/2020  . Mammogram  06/27/2021  . Colon Cancer Screening  07/09/2029  . COVID-19 Vaccine  Completed  . HPV Vaccine  Aged Out  *Topic was  postponed. The date shown is not the original due date.     Health Maintenance After Age 35 After age 45, you are at a higher risk for certain long-term diseases and infections as well as injuries from falls. Falls are a major cause of broken bones and head injuries in people who are older than age 82. Getting regular preventive care can help to keep you healthy and well. Preventive care includes getting regular testing and making lifestyle changes as recommended by your health care provider. Talk with your health care provider about:  Which screenings and tests you should have. A screening is a test that checks for a disease when you have no symptoms.  A diet and exercise plan that is right for you. What should I know about screenings and tests to prevent falls? Screening and testing are the best ways to find a health problem Jennifer Davidson. Jennifer Davidson diagnosis and treatment give you the best chance of managing medical conditions that are common after age 68. Certain conditions and lifestyle choices may make you more likely to have a fall. Your health care provider may recommend:  Regular vision checks. Poor vision and conditions such as cataracts can make you more likely to have a fall. If you wear glasses,  make sure to get your prescription updated if your vision changes.  Medicine review. Work with your health care provider to regularly review all of the medicines you are taking, including over-the-counter medicines. Ask your health care provider about any side effects that may make you more likely to have a fall. Tell your health care provider if any medicines that you take make you feel dizzy or sleepy.  Osteoporosis screening. Osteoporosis is a condition that causes the bones to get weaker. This can make the bones weak and cause them to break more easily.  Blood pressure screening. Blood pressure changes and medicines to control blood pressure can make you feel dizzy.  Strength and balance checks.  Your health care provider may recommend certain tests to check your strength and balance while standing, walking, or changing positions.  Foot health exam. Foot pain and numbness, as well as not wearing proper footwear, can make you more likely to have a fall.  Depression screening. You may be more likely to have a fall if you have a fear of falling, feel emotionally low, or feel unable to do activities that you used to do.  Alcohol use screening. Using too much alcohol can affect your balance and may make you more likely to have a fall. What actions can I take to lower my risk of falls? General instructions  Talk with your health care provider about your risks for falling. Tell your health care provider if: ? You fall. Be sure to tell your health care provider about all falls, even ones that seem minor. ? You feel dizzy, sleepy, or off-balance.  Take over-the-counter and prescription medicines only as told by your health care provider. These include any supplements.  Eat a healthy diet and maintain a healthy weight. A healthy diet includes low-fat dairy products, low-fat (lean) meats, and fiber from whole grains, beans, and lots of fruits and vegetables. Home safety  Remove any tripping hazards, such as rugs, cords, and clutter.  Install safety equipment such as grab bars in bathrooms and safety rails on stairs.  Keep rooms and walkways well-lit. Activity  Follow a regular exercise program to stay fit. This will help you maintain your balance. Ask your health care provider what types of exercise are appropriate for you.  If you need a cane or walker, use it as recommended by your health care provider.  Wear supportive shoes that have nonskid soles.   Lifestyle  Do not drink alcohol if your health care provider tells you not to drink.  If you drink alcohol, limit how much you have: ? 0-1 drink a day for women. ? 0-2 drinks a day for men.  Be aware of how much alcohol is in your  drink. In the [place] one drink equals one typical bottle of beer (12 oz), one-half glass of wine (5 oz), or one shot of hard liquor (1 oz).  Do not use any products that contain nicotine or tobacco, such as cigarettes and e-cigarettes. If you need help quitting, ask your health care provider. Summary  Having a healthy lifestyle and getting preventive care can help to protect your health and wellness after age 10965.  Screening and testing are the best way to find a health problem Jennifer Davidson and help you avoid having a fall. Jennifer Davidson diagnosis and treatment give you the best chance for managing medical conditions that are more common for people who are older than age 67.  Falls are a major cause of broken bones and head  injuries in people who are older than age 63. Take precautions to prevent a fall at home.  Work with your health care provider to learn what changes you can make to improve your health and wellness and to prevent falls. This information is not intended to replace advice given to you by your health care provider. Make sure you discuss any questions you have with your health care provider. Document Revised: 08/24/2018 Document Reviewed: 03/16/2017 Elsevier Patient Education  2021 [employer]    Critical care medicine: Principles of diagnosis and management in the adult (4th ed., pp. 1610-9604). [employer]unders."> Miller's anesthesia (8th ed., pp. 232-250). [employer]unders.">  Advance Directive  Advance directives are legal documents that allow you to make decisions about your health care and medical treatment in case you become unable to communicate for yourself. Advance directives let your wishes be known to family, friends, and health care providers. Discussing and writing advance directives should happen over time rather than all at once. Advance directives can be changed and updated at any time. There are different types of advance directives, such as:  Medical power of attorney.  Living  will.  Do not resuscitate (DNR) order or do not attempt resuscitation (DNAR) order. Health care proxy and medical power of attorney A health care proxy is also called a health care agent. This person is appointed to make medical decisions for you when you are unable to make decisions for yourself. Generally, people ask a trusted friend or family member to act as their proxy and represent their preferences. Make sure you have an agreement with your trusted person to act as your proxy. A proxy may have to make a medical decision on your behalf if your wishes are not known. A medical power of attorney, also called a durable power of attorney for health care, is a legal document that names your health care proxy. Depending on the laws in your state, the document may need to be:  Signed.  Notarized.  Dated.  Copied.  Witnessed.  Incorporated into your medical record. You may also want to appoint a trusted person to manage your money in the event you are unable to do so. This is called a durable power of attorney for finances. It is a separate legal document from the durable power of attorney for health care. You may choose your health care proxy or someone different to act as your agent in money matters. If you do not appoint a proxy, or there is a concern that the proxy is not acting in your best interest, a court may appoint a guardian to act on your behalf. Living will A living will is a set of instructions that state your wishes about medical care when you cannot express them yourself. Health care providers should keep a copy of your living will in your medical record. You may want to give a copy to family members or friends. To alert caregivers in case of an emergency, you can place a card in your wallet to let them know that you have a living will and where they can find it. A living will is used if you become:  Terminally ill.  Disabled.  Unable to communicate or make decisions. The  following decisions should be included in your living will:  To use or not to use life support equipment, such as dialysis machines and breathing machines (ventilators).  Whether you want a DNR or DNAR order. This tells health care providers not to use cardiopulmonary resuscitation (  CPR) if breathing or heartbeat stops.  To use or not to use tube feeding.  To be given or not to be given food and fluids.  Whether you want comfort (palliative) care when the goal becomes comfort rather than a cure.  Whether you want to donate your organs and tissues. A living will does not give instructions for distributing your money and property if you should pass away. DNR or DNAR A DNR or DNAR order is a request not to have CPR in the event that your heart stops beating or you stop breathing. If a DNR or DNAR order has not been made and shared, a health care provider will try to help any patient whose heart has stopped or who has stopped breathing. If you plan to have surgery, talk with your health care provider about how your DNR or DNAR order will be followed if problems occur. What if I do not have an advance directive? Some states assign family decision makers to act on your behalf if you do not have an advance directive. Each state has its own laws about advance directives. You may want to check with your health care provider, attorney, or state representative about the laws in your state. Summary  Advance directives are legal documents that allow you to make decisions about your health care and medical treatment in case you become unable to communicate for yourself.  The process of discussing and writing advance directives should happen over time. You can change and update advance directives at any time.  Advance directives may include a medical power of attorney, a living will, and a DNR or DNAR order. This information is not intended to replace advice given to you by your health care provider. Make  sure you discuss any questions you have with your health care provider. Document Revised: 02/05/2020 Document Reviewed: 02/05/2020 Elsevier Patient Education  2021 [employer].

## 2020-08-21 NOTE — Progress Notes (Signed)
MEDICARE ANNUAL WELLNESS VISIT  08/21/2020  Subjective:  Jennifer Davidson is a 65 y.o. female patient of Josafat Enrico, Coralee Pesa, NP who had a Medicare Annual Wellness Visit today. Jennifer Davidson is Retired and lives with Jennifer Davidson spouse. Jennifer Davidson has 3 children. Jennifer Davidson reports that Jennifer Davidson is socially active and does interact with friends/family regularly. Jennifer Davidson is minimally physically active and enjoys gardening. Jennifer Davidson is planning a trip to [place] in July with her grandchildren.   Patient Care Team: Ahava Kissoon, Coralee Pesa, NP as PCP - General (Nurse Practitioner) Sueanne Margarita, MD as PCP - Cardiology (Cardiology) [name], MD as Consulting Physician (Orthopedic Surgery) Rutherford Guys, MD as Consulting Physician (Ophthalmology) Normajean Glasgow, MD as Attending Physician (Physical Medicine and Rehabilitation)  Advanced Directives 08/21/2020 06/26/2020 04/29/2020 04/28/2020 04/17/2020 03/27/2020 05/16/2019  Does Patient Have a Medical Advance Directive? No No No No No No Yes  Type of Advance Directive - - - - - - Press photographer  Does patient want to make changes to medical advance directive? - - - - - - Yes (ED - Information included in AVS)  Would patient like information on creating a medical advance directive? Yes (MAU/Ambulatory/Procedural Areas - Information given) - No - Patient declined - - - -  Pre-existing out of facility DNR order (yellow form or pink MOST form) - - - - - - -  Some encounter information is confidential and restricted. Go to Review Flowsheets activity to see all data.    Hospital Utilization Over the Past 12 Months: # of hospitalizations or ER visits: 1 # of surgeries: 1  Review of Systems    Patient reports that her overall health is unchanged when compared to last year.  Review of Systems: Negative except shortness of breath on ocassion, inhaler works well  All other systems negative.  Pain Assessment Pain Score: 0-No pain     Current Medications & Allergies (verified) Allergies as of  08/21/2020      Reactions   Tramadol Nausea And Vomiting   Trazodone And Nefazodone Other (See Comments)   Per pt trazodone caused hallucinations and behavior changes      Medication List       Accurate as of August 21, 2020  6:03 PM. If you have any questions, ask your nurse or doctor.        STOP taking these medications   Aimovig 70 MG/ML Soaj Generic drug: Erenumab-aooe Stopped by: Jennifer Render, NP   zonisamide 25 MG capsule Commonly known as: ZONEGRAN Stopped by: Jennifer Render, NP     TAKE these medications   albuterol 108 (90 Base) MCG/ACT inhaler Commonly known as: Ventolin HFA TAKE 2 PUFFS BY MOUTH EVERY 6 HOURS AS NEEDED FOR WHEEZE OR SHORTNESS OF BREATH   Align 4 MG Caps Take 1 capsule (4 mg total) by mouth daily.   AMBULATORY NON FORMULARY MEDICATION Medication Name: Tetanus diphtheria booster Started by: Jennifer Render, NP   aspirin EC 81 MG tablet Take 1 tablet (81 mg total) by mouth daily.   atorvastatin 20 MG tablet Commonly known as: LIPITOR TAKE 1 TABLET BY MOUTH EVERY DAY   blood glucose meter kit and supplies Kit Dispense based on patient and insurance preference. Use up to four times daily as directed. Check blood sugar three times weekly, then as needed.   buPROPion 150 MG 24 hr tablet Commonly known as: WELLBUTRIN XL Take 1 tablet (150 mg total) by mouth daily.   busPIRone 7.5 MG tablet Commonly  known as: BUSPAR Take 1 tablet (7.5 mg total) by mouth 3 (three) times daily.   cetirizine 10 MG tablet Commonly known as: ZYRTEC Take 1 tablet (10 mg total) by mouth at bedtime.   diclofenac sodium 1 % Gel Commonly known as: VOLTAREN Apply 2 g topically daily as needed (for knee pain).   DULoxetine 60 MG capsule Commonly known as: CYMBALTA TAKE 1 CAPSULE EVERY DAY   esomeprazole 40 MG capsule Commonly known as: NEXIUM TAKE 1 CAPSULE (40 MG TOTAL) BY MOUTH DAILY.   famotidine 20 MG tablet Commonly known as: PEPCID Take 1 tablet (20 mg  total) by mouth daily as needed for heartburn or indigestion.   fluticasone 50 MCG/ACT nasal spray Commonly known as: FLONASE PLACE 2 SPRAYS AT BEDTIME INTO BOTH NOSTRILS. What changed:   how much to take  how to take this  when to take this   gabapentin 600 MG tablet Commonly known as: Neurontin Take 1 tablet (600 mg total) by mouth 3 (three) times daily. What changed:   how much to take  when to take this   hydrOXYzine 50 MG tablet Commonly known as: ATARAX/VISTARIL Take 1 tablet (50 mg total) by mouth every 6 (six) hours as needed.   icosapent Ethyl 1 g capsule Commonly known as: Vascepa Take 2 capsules (2 g total) by mouth 2 (two) times daily.   levothyroxine 50 MCG tablet Commonly known as: SYNTHROID TAKE 1 TABLET BY MOUTH EVERY OTHER DAY. BEFORE BREAKFAST, ALTERNATED WITH 75MCG TABLET. What changed: See the new instructions.   levothyroxine 75 MCG tablet Commonly known as: SYNTHROID Take 1 tablet (75 mcg total) by mouth every other day. Before breakfast What changed: Another medication with the same name was changed. Make sure you understand how and when to take each.   meclizine 50 MG tablet Commonly known as: ANTIVERT Take 0.5 tablets (25 mg total) by mouth 2 (two) times daily as needed for dizziness.   metFORMIN 500 MG tablet Commonly known as: GLUCOPHAGE Take 1 tablet (500 mg total) by mouth 2 (two) times daily with a meal.   multivitamin with minerals tablet Take 1 tablet by mouth daily.   ondansetron 4 MG disintegrating tablet Commonly known as: Zofran ODT Take 1 tablet (4 mg total) by mouth every 8 (eight) hours as needed for nausea or vomiting.   prazosin 5 MG capsule Commonly known as: MINIPRESS Take 2-3 capsules (10-15 mg total) by mouth at bedtime. TAKE 2 TO 3 CAPSULES AT BEDTIME   QUEtiapine 300 MG tablet Commonly known as: SEROQUEL TAKE 1 TABLET BY MOUTH EVERYDAY AT BEDTIME What changed:   how much to take  how to take this  when  to take this   rizatriptan 10 MG disintegrating tablet Commonly known as: MAXALT-MLT Take 1 tablet (10 mg total) by mouth as needed for migraine. May repeat in 2 hours if needed   tiZANidine 4 MG tablet Commonly known as: Zanaflex Take 1 tablet (4 mg total) by mouth every 8 (eight) hours as needed for muscle spasms.       History (reviewed): Past Medical History:  Diagnosis Date  . Acute cystitis without hematuria   . Acute encephalopathy 06/25/2016  . Altered mental status 06/23/2017  . Anxiety   . Anxiety and depression 02/04/2012  . Arthritis   . Cataract 08/31/2017   left eye  . CHF (congestive heart failure) ([place])    resolved  . Cholesterol serum increased   . Chronic back pain  chronic Rt low back pain. s/p L4-5 fusion. failed Rt facet injections. poss due to Rt SI joint dysfunction.  . Chronic kidney disease   . Chronic pain of right lower extremity 05/14/2015  . Community acquired pneumonia of left lower lobe of lung 09/25/2018  . Complication of anesthesia   . 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  . Depression   . Diastolic heart failure ([place])   . Diastolic heart failure ([place]) 03/17/2012  . Edema 02/04/2012  . Encounter for long-term (current) use of medications 02/04/2012  . Fall as cause of accidental injury at home as place of occurrence 09/08/2018  . GERD (gastroesophageal reflux disease)   . GERD (gastroesophageal reflux disease) 11/13/2015  . Hallucination, visual 09/14/2017  . Head injury 09/08/2018  . Hyperlipidemia   . Hyperparathyroidism ([place])   . Hyperparathyroidism, secondary renal ([place]) 07/31/2014   [facility] [name], MD  Plan:  25 Hydroxy Vit D (59.1 ng/ml)   . Hypertension   . Hypothyroidism   . Lower back pain 09/21/2013   [facility]. [name], MD. 08/31/2013 L4-5 fusion. Right sacroiliac joint dysfunction may very well explain current symptoms.   . Migraine   .  Moderate episode of recurrent major depressive disorder ([place]) 08/31/2016  . Neuromuscular disorder ([place])   . PONV (postoperative nausea and vomiting)   . Pre-diabetes   . Recurrent UTI 06/25/2016  . Seizures ([place])    3 years ago,06/20/19  . Sepsis ([place]) 04/29/2020  . Severe recurrent depression with psychosis ([place]) 06/25/2016  . Severe recurrent major depressive disorder with psychotic features ([place])   . Sleep apnea    cpap  . Syncope 08/30/2015  . Syncope and collapse 02/12/2012  . Tachycardia   . Tachycardia   . Tachycardia 02/04/2012  . Trochanteric bursitis of both hips 2012   Confirmed on MRI  . URI (upper respiratory infection) 09/27/2018   Past Surgical History:  Procedure Laterality Date  . ABDOMINAL HYSTERECTOMY    . APPENDECTOMY  1986  . BACK SURGERY     L4-5 fusion  . CATARACT EXTRACTION    . KNEE SURGERY    . REVERSE SHOULDER ARTHROPLASTY Left 07/03/2020   Procedure: REVERSE SHOULDER ARTHROPLASTY;  Surgeon: [name], MD;  Location: [facility];  Service: Orthopedics;  Laterality: Left;  . SHOULDER ARTHROSCOPY WITH SUBACROMIAL DECOMPRESSION Left 10/20/2018   Procedure: SHOULDER ARTHROSCOPY, ROTATOR CUFF DEBRIDEMENT, GLENOHUMERAL JOINT DEBRIDEMENT, ACROMIOPLASTY, DISTAL CLAVICLE RESECTION;  Surgeon: [name], MD;  Location: [facility];  Service: Orthopedics;  Laterality: Left;   Family History  Problem Relation Age of Onset  . Diabetes Mother   . Heart disease Mother   . Kidney disease Mother   . Thyroid disease Mother   . Heart disease Father   . Seizures Father   . COPD Father   . Irritable bowel syndrome Father   . Cancer Maternal Grandmother   . Diabetes Sister   . Diabetes Brother   . Colon cancer Neg Hx   . Stomach cancer Neg Hx   . Colon polyps Neg Hx   . Esophageal cancer Neg Hx   . Rectal cancer Neg Hx    Social History   Socioeconomic History  . Marital status: Married    Spouse name: Elta Guadeloupe  . Number of children: 3  . Years of education: 94  .  Highest education level: Some college, no degree  Occupational History  . Occupation: Retired  Tobacco Use  .  Smoking status: Never Smoker  . Smokeless tobacco: Never Used  Vaping Use  . Vaping Use: Never used  Substance and Sexual Activity  . Alcohol use: Never  . Drug use: Never    Comment: 08-31-2016 PER PT NO   . Sexual activity: Yes    Partners: Male    Comment: married  Other Topics Concern  . Not on file  Social History Narrative   Right handed   Lives with husband one story   Three adult children that live close by. Close relationship with grandchildren    Social Determinants of Health   Financial Resource Strain: Low Risk   . Difficulty of Paying Living Expenses: Not hard at all  Food Insecurity: No Food Insecurity  . Worried About Charity fundraiser in the Last Year: Never true  . Ran Out of Food in the Last Year: Never true  Transportation Needs: No Transportation Needs  . Lack of Transportation (Medical): No  . Lack of Transportation (Non-Medical): No  Physical Activity: Insufficiently Active  . Days of Exercise per Week: 4 days  . Minutes of Exercise per Session: 10 min  Stress: Stress Concern Present  . Feeling of Stress : Rather much  Social Connections: Moderately Isolated  . Frequency of Communication with Friends and Family: More than three times a week  . Frequency of Social Gatherings with Friends and Family: More than three times a week  . Attends Religious Services: Never  . Active Member of Clubs or Organizations: No  . Attends Archivist Meetings: Never  . Marital Status: Married    Activities of Daily Living In your present state of health, do you have any difficulty performing the following activities: 08/21/2020 06/26/2020  Hearing? Y N  Vision? N N  Difficulty concentrating or making decisions? Y N  Comment sometimes -  Walking or climbing stairs? N N  Comment - -  Dressing or bathing? N N  Doing errands, shopping? N N   Preparing Food and eating ? N -  Using the Toilet? N -  In the past six months, have you accidently leaked urine? N -  Do you have problems with loss of bowel control? N -  Managing your Medications? N -  Managing your Finances? N -  Housekeeping or managing your Housekeeping? N -  Some recent data might be hidden      Exercise Current Exercise Habits: Home exercise routine, Type of exercise: walking, Time (Minutes): 15, Frequency (Times/Week): 4, Weekly Exercise (Minutes/Week): 60, Intensity: Mild, Exercise limited by: neurologic condition(s);orthopedic condition(s)  Diet Patient reports consuming 2 meals a day and 1 snack(s) a day Patient reports that her primary diet is: low fat, sodium, and carbohydrates Patient reports that Jennifer Davidson does have regular access to food.   Depression Screen PHQ 2/9 Scores 08/21/2020 06/23/2020 05/21/2020 05/06/2020 11/16/2019 07/19/2019 05/21/2019  PHQ - 2 Score 2 0 0 0 2 0 0  PHQ- 9 Score 12 - - - 8 - -  Some encounter information is confidential and restricted. Go to Review Flowsheets activity to see all data.     Fall Risk Fall Risk  08/21/2020 06/23/2020 05/21/2020 05/06/2020 03/27/2020  Falls in the past year? 0 0 0 0 1  Comment - - - - -  Number falls in past yr: 0 0 0 0 1  Injury with Fall? 0 0 0 0 0  Comment - - - - -  Risk Factor Category  - - - - -  Risk for fall due to : No Fall Risks - - - -  Risk for fall due to: Comment - - - - -  Follow up - Falls evaluation completed Falls evaluation completed Falls evaluation completed -     Objective:   BP (!) 109/57   Pulse 98   Ht 5' 3"  (1.6 m)   Wt 191 lb (86.6 kg)   SpO2 98%   BMI 33.83 kg/m   Last Weight  Most recent update: 08/21/2020  8:25 AM   Weight  86.6 kg (191 lb)            Body mass index is 33.83 kg/m.  Hearing/Vision  . Jennifer Davidson did not have difficulty with hearing/understanding during the face-to-face interview . Jennifer Davidson did not have difficulty with her vision during the face-to-face  interview . Reports that Jennifer Davidson has had a formal eye exam by an eye care professional within the past year . Reports that Jennifer Davidson has had a formal hearing evaluation within the past year  Cognitive Function: 6CIT Screen 08/21/2020 05/16/2019 04/18/2017  What Year? 0 points 0 points 0 points  What month? 0 points 0 points 0 points  What time? 0 points 0 points 0 points  Count back from 20 0 points 0 points 0 points  Months in reverse 0 points 0 points 0 points  Repeat phrase 0 points 4 points 0 points  Total Score 0 4 0    Normal Cognitive Function Screening: Yes (Normal:0-7, Significant for Dysfunction: >8)  Immunization & Health Maintenance Record Immunization History  Administered Date(s) Administered  . Influenza Split 02/18/2012  . Influenza, High Dose Seasonal PF 02/17/2020  . Influenza,inj,Quad PF,6+ Mos 02/16/2013, 04/18/2017, 02/28/2018  . Influenza-Unspecified 03/25/2014, 01/02/2016  . Moderna Sars-Covid-2 Vaccination 08/22/2019, 09/19/2019, 05/06/2020  . Pneumococcal Conjugate-13 02/19/2019  . Pneumococcal Polysaccharide-23 06/21/2014, 12/02/2014  . Zoster 08/06/2014    Health Maintenance  Topic Date Due  . DEXA SCAN  Never done  . PNA vac Low Risk Adult (2 of 2 - PPSV23) 02/19/2020  . TETANUS/TDAP  11/15/2020 (Originally 05/17/2018)  . Hepatitis C Screening  11/15/2020 (Originally June 01, 1955)  . INFLUENZA VACCINE  12/15/2020  . MAMMOGRAM  06/27/2021  . COLONOSCOPY (Pts 45-51yr Insurance coverage will need to be confirmed)  07/09/2029  . COVID-19 Vaccine  Completed  . HPV VACCINES  Aged Out       Assessment  This is a routine wellness examination for Jennifer Davidson  Health Maintenance: Due or Overdue Health Maintenance Due  Topic Date Due  . DEXA SCAN  Never done  . PNA vac Low Risk Adult (2 of 2 - PPSV23) 02/19/2020    Jennifer Davidson not need a referral for Community Assistance: Care Management:   no Social Work:    no Prescription  Assistance:  no Nutrition/Diabetes Education:  no   Plan:  Personalized Goals Goals Addressed            This Visit's Progress   . Exercise 3x per week (30 min per time)       Walking more starting at 20 minutes per day.      Personalized Health Maintenance & Screening Recommendations  Td vaccine Advanced directives: has NO advanced directive  - add't info requested. Referral to SW: no  Lung Cancer Screening Recommended: not applicable (Low Dose CT Chest recommended if Age 67-80years, 30 pack-year currently smoking OR have quit w/in past 15 years) Hepatitis C Screening recommended: yes- with next labs HIV  Screening recommended: no  Advanced Directives: Written information was given per the patient's request.  Referrals & Orders No orders of the defined types were placed in this encounter. Written prescription for TD booster printed and provided to patient to take to pharmacy.  Follow-up Plan . Follow-up with Jennifer Davidson, Coralee Pesa, NP as planned . Schedule 6 months . Patient reported PPSV23 vaccine received in December from [facility] in [place]- will need to obtain record of this.  . Insurance denied coverage for glucometer- will need to follow-up on this, as with medicare this should be covered as DME.    I have personally reviewed and noted the following in the patient's chart:   . Medical and social history . Use of alcohol, tobacco or illicit drugs  . Current medications and supplements . Functional ability and status . Nutritional status . Physical activity . Advanced directives . List of other physicians . Hospitalizations, surgeries, and ER visits in previous 12 months . Vitals . Screenings to include cognitive, depression, and falls . Referrals and appointments  In addition, I have reviewed and discussed with patient certain preventive protocols, quality metrics, and best practice recommendations. A written personalized care plan for preventive services as well as  general preventive health recommendations were provided to patient.     Jennifer Davidson  08/21/2020

## 2020-08-24 ENCOUNTER — Encounter (HOSPITAL_BASED_OUTPATIENT_CLINIC_OR_DEPARTMENT_OTHER): Payer: Self-pay | Admitting: Nurse Practitioner

## 2020-08-25 ENCOUNTER — Encounter (HOSPITAL_BASED_OUTPATIENT_CLINIC_OR_DEPARTMENT_OTHER): Payer: Self-pay

## 2020-08-25 ENCOUNTER — Other Ambulatory Visit (HOSPITAL_BASED_OUTPATIENT_CLINIC_OR_DEPARTMENT_OTHER): Payer: Self-pay | Admitting: Nurse Practitioner

## 2020-08-25 MED ORDER — FLUCONAZOLE 150 MG PO TABS: 150.0000 mg | ORAL_TABLET | Freq: Every day | ORAL | 1 refills | Status: DC

## 2020-09-19 ENCOUNTER — Ambulatory Visit: Payer: Medicare Other

## 2020-09-23 NOTE — Progress Notes (Signed)
NEUROLOGY FOLLOW UP OFFICE NOTE  Jennifer Davidson [id number]  Assessment/Plan:   Migraine without aura, without status migrainosus, not intractable  1.  Migraine prevention:  zonisamide 136m daily 2.  Migraine rescue:  I will have her try samples of Ubrelvy 1077m3.  Limit use of pain relievers to no more than 2 days out of week to prevent risk of rebound or medication-overuse headache. 4.  Keep headache diary 5.  Follow up 6 months.  Subjective:  SuAlexzandra Biltons a [age]ear old right-handed female with hypothyroidism, depression, anxiety, OSA who follows up for migraines.  UPDATE: Aimovig was approved but too expensive.  Started on zonisamide in late March.  She had 5 headaches in April.  Severe.  Last a few hours with Excedrin.  She has ran out a week and a half ago but did notice less headaches.   Current NSAIDS/analgesics:  ASA 8130maily, Excedrin Migraine Current triptans:  none Current ergotamine:  none Current anti-emetic:  none Current muscle relaxants:  none Current Antihypertensive medications:  prazosin Current Antidepressant medications:  Cymbalta 67m38mily, Wellbutrin XL 150mg71mrent Anticonvulsant medications:  zonisamide 100mg 65mgabapentin 400mg Q19murrent anti-CGRP:  none Current Vitamins/Herbal/Supplements:  none Current Antihistamines/Decongestants:  none Other therapy:  none Hormone/birth control:  none Other medications:  Seroquel, Synthroid  Caffeine:  No coffee.  1 Diet Dr. Pepper Jennifer BondsDiet:  Drinks water and Powerade.  Sometimes skips meals Exercise:  Walks  But not routine Depression:  yes; Anxiety:  yes Other pain:  Some shoulder pain. Sleep hygiene:  Poor.  Trouble staying asleep.  Has CPAP.   HISTORY: Migraines since 10 year90old.  Over the last 3 months, they have increased in frequency.  No known aggravating factor.  They are are usually severe occipital pounding headaches associated with seeing black spots, nausea, vomiting,  photophobia, phonophobia but no speech disturbance, numbness or weakness.  They last 6 to 12 hours. They occur about 3 days a week.  No known triggers.  Laying down in dark quiet room helps.   She has longstanding history of seizure-like episodes with left sided jerking or confusion.  She has been previously worked up by my collManufacturing engineerquino,Delice Lescher events have been determined to be psychogenic.  Long-term EEG monitoring captured at least 3 events, which were normal.     Past NSAIDS/analgesics:  Ibuprofen, naproxen, Excedrin Migraine Past abortive triptans:  Sumatriptan 50mg, M27mt-MLT 10mg Pas43mortive ergotamine:  none Past muscle relaxants:  Flexeril, Robaxin, Zanflex Past anti-emetic:  Zofran Past antihypertensive medications:  Propranolol, Lasix Past antidepressant medications:  Amitriptyline 75mg, cit70mram Past anticonvulsant medications:  topiramate Past anti-CGRP:  none Past vitamins/Herbal/Supplements:  Melatonin, MVI Past antihistamines/decongestants:  Meclizine, Allegra Other past therapies:  none  Prior imaging of brain performed, personally reviewed: 02/12/2012:  Normal CT head. 08/09/2016 MRI BRAIN WO:  Negative MRI of brain for age.  No intracranial abnormality. 06/23/2017 CTA HEAD & NECK:  Minor atheromatous change at the carotid bifurcations. No flow-limiting extracranial or intracranial stenosis or dissection.  No acute intracranial findings. No abnormal postcontrast enhancement. 06/24/2017 MRI BRAIN WO:  Stable since 2016 and normal for age noncontrast MRI appearance of the brain.    Family history of headache:  No  PAST MEDICAL HISTORY: Past Medical History:  Diagnosis Date  . Acute cystitis without hematuria   . Acute encephalopathy 06/25/2016  . Altered mental status 06/23/2017  . Anxiety   . Anxiety and depression  02/04/2012  . Arthritis   . Cataract 08/31/2017   left eye  . CHF (congestive heart failure) ([place])    resolved  .  Cholesterol serum increased   . Chronic back pain    chronic Rt low back pain. s/p L4-5 fusion. failed Rt facet injections. poss due to Rt SI joint dysfunction.  . Chronic kidney disease   . Chronic pain of right lower extremity 05/14/2015  . Community acquired pneumonia of left lower lobe of lung 09/25/2018  . Complication of anesthesia   . 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  . Depression   . Diastolic heart failure ([place])   . Diastolic heart failure ([place]) 03/17/2012  . Edema 02/04/2012  . Encounter for long-term (current) use of medications 02/04/2012  . Fall as cause of accidental injury at home as place of occurrence 09/08/2018  . GERD (gastroesophageal reflux disease)   . GERD (gastroesophageal reflux disease) 11/13/2015  . Hallucination, visual 09/14/2017  . Head injury 09/08/2018  . Hyperlipidemia   . Hyperparathyroidism ([place])   . Hyperparathyroidism, secondary renal ([place]) 07/31/2014   [facility] [name], MD  Plan:  25 Hydroxy Vit D (59.1 ng/ml)   . Hypertension   . Hypothyroidism   . Lower back pain 09/21/2013   [facility]. [name], MD. 08/31/2013 L4-5 fusion. Right sacroiliac joint dysfunction may very well explain current symptoms.   . Migraine   . Moderate episode of recurrent major depressive disorder ([place]) 08/31/2016  . Neuromuscular disorder ([place])   . PONV (postoperative nausea and vomiting)   . Pre-diabetes   . Recurrent UTI 06/25/2016  . Seizures ([place])    3 years ago,06/20/19  . Sepsis ([place]) 04/29/2020  . Severe recurrent depression with psychosis ([place]) 06/25/2016  . Severe recurrent major depressive disorder with psychotic features ([place])   . Sleep apnea    cpap  . Syncope 08/30/2015  . Syncope and collapse 02/12/2012  . Tachycardia   . Tachycardia   . Tachycardia 02/04/2012  . Trochanteric bursitis of both hips 2012   Confirmed on MRI  . URI (upper respiratory infection) 09/27/2018     MEDICATIONS: Current Outpatient Medications on File Prior to Visit  Medication Sig Dispense Refill  . albuterol (VENTOLIN HFA) 108 (90 Base) MCG/ACT inhaler TAKE 2 PUFFS BY MOUTH EVERY 6 HOURS AS NEEDED FOR WHEEZE OR SHORTNESS OF BREATH 18 each 0  . AMBULATORY NON FORMULARY MEDICATION Medication Name: Tetanus diphtheria booster 1 Doses/Fill 0  . aspirin EC 81 MG tablet Take 1 tablet (81 mg total) by mouth daily. 90 tablet 3  . atorvastatin (LIPITOR) 20 MG tablet TAKE 1 TABLET BY MOUTH EVERY DAY (Patient taking differently: Take 20 mg by mouth daily.) 90 tablet 2  . blood glucose meter kit and supplies KIT Dispense based on patient and insurance preference. Use up to four times daily as directed. Check blood sugar three times weekly, then as needed. 1 each 0  . buPROPion (WELLBUTRIN XL) 150 MG 24 hr tablet Take 1 tablet (150 mg total) by mouth daily. 90 tablet 1  . busPIRone (BUSPAR) 7.5 MG tablet Take 1 tablet (7.5 mg total) by mouth 3 (three) times daily. 270 tablet 1  . cetirizine (ZYRTEC) 10 MG tablet Take 1 tablet (10 mg total) by mouth at bedtime. 90 tablet 3  . diclofenac sodium (VOLTAREN) 1 % GEL Apply 2 g topically daily as needed (for knee pain). 100 g 3  . DULoxetine (CYMBALTA)  60 MG capsule TAKE 1 CAPSULE EVERY DAY (Patient taking differently: Take 60 mg by mouth daily.) 90 capsule 1  . esomeprazole (NEXIUM) 40 MG capsule TAKE 1 CAPSULE (40 MG TOTAL) BY MOUTH DAILY. 90 capsule 3  . famotidine (PEPCID) 20 MG tablet Take 1 tablet (20 mg total) by mouth daily as needed for heartburn or indigestion. 90 tablet 3  . fluconazole (DIFLUCAN) 150 MG tablet Take 1 tablet (150 mg total) by mouth daily. 1 tablet 1  . fluticasone (FLONASE) 50 MCG/ACT nasal spray PLACE 2 SPRAYS AT BEDTIME INTO BOTH NOSTRILS. (Patient taking differently: Place 2 sprays into both nostrils at bedtime. PLACE 2 SPRAYS AT BEDTIME INTO BOTH NOSTRILS.) 16 g 5  . gabapentin (NEURONTIN) 600 MG tablet Take 1 tablet (600 mg  total) by mouth 3 (three) times daily. (Patient taking differently: Take 900 mg by mouth at bedtime.) 270 tablet 1  . hydrOXYzine (ATARAX/VISTARIL) 50 MG tablet Take 1 tablet (50 mg total) by mouth every 6 (six) hours as needed. 360 tablet 2  . icosapent Ethyl (VASCEPA) 1 g capsule Take 2 capsules (2 g total) by mouth 2 (two) times daily. 360 capsule 3  . levothyroxine (SYNTHROID) 50 MCG tablet TAKE 1 TABLET BY MOUTH EVERY OTHER DAY. BEFORE BREAKFAST, ALTERNATED WITH 75MCG TABLET. (Patient taking differently: Take 50 mcg by mouth every other day. Before breakfast) 90 tablet 1  . levothyroxine (SYNTHROID) 75 MCG tablet Take 1 tablet (75 mcg total) by mouth every other day. Before breakfast 90 tablet 3  . meclizine (ANTIVERT) 50 MG tablet Take 0.5 tablets (25 mg total) by mouth 2 (two) times daily as needed for dizziness. 60 tablet 3  . metFORMIN (GLUCOPHAGE) 500 MG tablet Take 1 tablet (500 mg total) by mouth 2 (two) times daily with a meal. 180 tablet 0  . Multiple Vitamins-Minerals (MULTIVITAMIN WITH MINERALS) tablet Take 1 tablet by mouth daily.    . ondansetron (ZOFRAN ODT) 4 MG disintegrating tablet Take 1 tablet (4 mg total) by mouth every 8 (eight) hours as needed for nausea or vomiting. 20 tablet 3  . prazosin (MINIPRESS) 5 MG capsule Take 2-3 capsules (10-15 mg total) by mouth at bedtime. TAKE 2 TO 3 CAPSULES AT BEDTIME 270 capsule 1  . Probiotic Product (ALIGN) 4 MG CAPS Take 1 capsule (4 mg total) by mouth daily. 30 capsule 0  . QUEtiapine (SEROQUEL) 300 MG tablet TAKE 1 TABLET BY MOUTH EVERYDAY AT BEDTIME (Patient taking differently: Take 300 mg by mouth at bedtime. TAKE 1 TABLET BY MOUTH EVERYDAY AT BEDTIME) 90 tablet 1  . rizatriptan (MAXALT-MLT) 10 MG disintegrating tablet Take 1 tablet (10 mg total) by mouth as needed for migraine. May repeat in 2 hours if needed 30 tablet 3  . tiZANidine (ZANAFLEX) 4 MG tablet Take 1 tablet (4 mg total) by mouth every 8 (eight) hours as needed for muscle  spasms. 30 tablet 1   No current facility-administered medications on file prior to visit.    ALLERGIES: Allergies  Allergen Reactions  . Tramadol Nausea And Vomiting  . Trazodone And Nefazodone Other (See Comments)    Per pt trazodone caused hallucinations and behavior changes     FAMILY HISTORY: Family History  Problem Relation Age of Onset  . Diabetes Mother   . Heart disease Mother   . Kidney disease Mother   . Thyroid disease Mother   . Heart disease Father   . Seizures Father   . COPD Father   . Irritable bowel  syndrome Father   . Cancer Maternal Grandmother   . Diabetes Sister   . Diabetes Brother   . Colon cancer Neg Hx   . Stomach cancer Neg Hx   . Colon polyps Neg Hx   . Esophageal cancer Neg Hx   . Rectal cancer Neg Hx       Objective:  Blood pressure (!) 96/54, pulse (!) 122, height 5' 3"  (1.6 m), weight 188 lb 12.8 oz (85.6 kg), SpO2 93 %. General: No acute distress.  Patient appears well-groomed.     Metta Clines, DO  CC: Jacolyn Reedy, NP

## 2020-09-24 ENCOUNTER — Other Ambulatory Visit: Payer: Self-pay

## 2020-09-24 ENCOUNTER — Ambulatory Visit (INDEPENDENT_AMBULATORY_CARE_PROVIDER_SITE_OTHER): Payer: Medicare Other | Admitting: Neurology

## 2020-09-24 ENCOUNTER — Encounter: Payer: Self-pay | Admitting: Neurology

## 2020-09-24 VITALS — BP 96/54 | HR 122 | Ht 63.0 in | Wt 188.8 lb

## 2020-09-24 DIAGNOSIS — I2583 Coronary atherosclerosis due to lipid rich plaque: Secondary | ICD-10-CM | POA: Diagnosis not present

## 2020-09-24 DIAGNOSIS — I2510 Atherosclerotic heart disease of native coronary artery without angina pectoris: Secondary | ICD-10-CM | POA: Diagnosis not present

## 2020-09-24 DIAGNOSIS — G43009 Migraine without aura, not intractable, without status migrainosus: Secondary | ICD-10-CM | POA: Diagnosis not present

## 2020-09-24 MED ORDER — ZONISAMIDE 100 MG PO CAPS: 100.0000 mg | ORAL_CAPSULE | Freq: Every day | ORAL | 5 refills | Status: DC

## 2020-09-24 NOTE — Patient Instructions (Signed)
1.  Take zonisamide 100mg  daily 2.  At earliest onset of migraine, take Ubrelvy 100mg .  May repeat in 2 hours.  If effective, contact me for prescription. 3.  Limit use of pain relievers to no more than 2 days out of week to prevent risk of rebound or medication-overuse headache. 4.  Keep headache diary 5.  Follow up 6 months

## 2020-09-26 DIAGNOSIS — Z471 Aftercare following joint replacement surgery: Secondary | ICD-10-CM | POA: Diagnosis not present

## 2020-09-26 DIAGNOSIS — Z96612 Presence of left artificial shoulder joint: Secondary | ICD-10-CM | POA: Diagnosis not present

## 2020-10-06 DIAGNOSIS — M4722 Other spondylosis with radiculopathy, cervical region: Secondary | ICD-10-CM | POA: Diagnosis not present

## 2020-10-22 DIAGNOSIS — M4692 Unspecified inflammatory spondylopathy, cervical region: Secondary | ICD-10-CM | POA: Diagnosis not present

## 2020-10-27 ENCOUNTER — Ambulatory Visit (HOSPITAL_COMMUNITY): Payer: Medicare Other

## 2020-11-03 ENCOUNTER — Telehealth: Payer: Self-pay

## 2020-11-03 ENCOUNTER — Other Ambulatory Visit: Payer: Self-pay

## 2020-11-03 ENCOUNTER — Ambulatory Visit
Admission: RE | Admit: 2020-11-03 | Discharge: 2020-11-03 | Disposition: A | Discharge status: Home / Self Care | Payer: Medicare Other | Source: Ambulatory Visit | Attending: Family Medicine | Admitting: Family Medicine

## 2020-11-03 DIAGNOSIS — Z1231 Encounter for screening mammogram for malignant neoplasm of breast: Secondary | ICD-10-CM

## 2020-11-03 DIAGNOSIS — M85832 Other specified disorders of bone density and structure, left forearm: Secondary | ICD-10-CM | POA: Diagnosis not present

## 2020-11-03 DIAGNOSIS — Z780 Asymptomatic menopausal state: Secondary | ICD-10-CM | POA: Diagnosis not present

## 2020-11-03 MED ORDER — ZONISAMIDE 100 MG PO CAPS: 100.0000 mg | ORAL_CAPSULE | Freq: Every day | ORAL | 5 refills | Status: DC

## 2020-11-03 NOTE — Telephone Encounter (Signed)
Fax received, please send script for Zonisamide 100 mg.

## 2020-11-04 ENCOUNTER — Ambulatory Visit (INDEPENDENT_AMBULATORY_CARE_PROVIDER_SITE_OTHER): Payer: Medicare Other | Admitting: Nurse Practitioner

## 2020-11-04 ENCOUNTER — Encounter (HOSPITAL_BASED_OUTPATIENT_CLINIC_OR_DEPARTMENT_OTHER): Payer: Self-pay | Admitting: Nurse Practitioner

## 2020-11-04 VITALS — BP 131/74 | HR 89 | Ht 61.0 in | Wt 186.4 lb

## 2020-11-04 DIAGNOSIS — R5383 Other fatigue: Secondary | ICD-10-CM

## 2020-11-04 DIAGNOSIS — R300 Dysuria: Secondary | ICD-10-CM | POA: Diagnosis not present

## 2020-11-04 DIAGNOSIS — R110 Nausea: Secondary | ICD-10-CM | POA: Diagnosis not present

## 2020-11-04 DIAGNOSIS — N3090 Cystitis, unspecified without hematuria: Secondary | ICD-10-CM

## 2020-11-04 LAB — POCT URINALYSIS DIP (CLINITEK)
Bilirubin, UA: NEGATIVE
Glucose, UA: NEGATIVE mg/dL
Ketones, POC UA: NEGATIVE mg/dL
Nitrite, UA: POSITIVE — AB
POC PROTEIN,UA: NEGATIVE
Spec Grav, UA: 1.025 (ref 1.010–1.025)
Urobilinogen, UA: 0.2 E.U./dL
pH, UA: 6 (ref 5.0–8.0)

## 2020-11-04 MED ORDER — FLUCONAZOLE 150 MG PO TABS: 150.0000 mg | ORAL_TABLET | Freq: Once | ORAL | 1 refills | Status: AC

## 2020-11-04 MED ORDER — CIPROFLOXACIN HCL 500 MG PO TABS: 500.0000 mg | ORAL_TABLET | Freq: Two times a day (BID) | ORAL | 0 refills | Status: DC

## 2020-11-04 NOTE — Progress Notes (Signed)
Established Patient Office Visit  Subjective:  Patient ID: Jennifer Davidson, female    DOB: 1956/01/06  Age: 65 y.o. MRN: [id number]  CC:  Chief Complaint  Patient presents with   Fatigue    Patient states she is sluggish, nauseous, fatigued and no appetite for three weeks.  She has had a headache everyday as well.     HPI ALEINA BURGIO presents for generalized feeling of illness. She endorses feeling nauseous, fatigued, muscle pain, and has had a decreased appetite for about 3 weeks. She has also had a daily headache. About 4 weeks ago she did start on a new medication from Dr. Everlena Cooper with neurology for her migraine headaches. She has not changed any other medications other than that.  She does endorse dysuria, but denies abdominal pain, vomiting, blood in urine, or back pain.   Of note, she reports her blood sugars have been between 99-111 every morning upon awakening.   ROS Review of Systems All review of systems negative except what is listed in the HPI    Objective:    Physical Exam Vitals and nursing note reviewed.  Constitutional:      Appearance: Normal appearance. She is ill-appearing.  HENT:     Head: Normocephalic and atraumatic.     Right Ear: Tympanic membrane normal.     Left Ear: Tympanic membrane normal.  Eyes:     Extraocular Movements: Extraocular movements intact.     Conjunctiva/sclera: Conjunctivae normal.     Pupils: Pupils are equal, round, and reactive to light.  Cardiovascular:     Rate and Rhythm: Normal rate and regular rhythm.     Pulses: Normal pulses.     Heart sounds: Normal heart sounds.  Pulmonary:     Effort: Pulmonary effort is normal.     Breath sounds: Normal breath sounds.  Abdominal:     General: Abdomen is flat. Bowel sounds are normal. There is no distension.     Palpations: Abdomen is soft.     Tenderness: There is no abdominal tenderness. There is no right CVA tenderness, left CVA tenderness, guarding or rebound.  Musculoskeletal:      Cervical back: Normal range of motion and neck supple.     Right lower leg: No edema.     Left lower leg: No edema.  Skin:    General: Skin is warm and dry.     Capillary Refill: Capillary refill takes less than 2 seconds.  Neurological:     General: No focal deficit present.     Mental Status: She is alert.  Psychiatric:        Mood and Affect: Mood normal.        Behavior: Behavior normal.        Thought Content: Thought content normal.        Judgment: Judgment normal.    BP 131/74   Pulse 89   Ht 5\' 1"  (1.549 m)   Wt 186 lb 6.4 oz (84.6 kg)   BMI 35.22 kg/m  Wt Readings from Last 3 Encounters:  11/04/20 186 lb 6.4 oz (84.6 kg)  09/24/20 188 lb 12.8 oz (85.6 kg)  08/21/20 191 lb (86.6 kg)      Assessment & Plan:   Problem List Items Addressed This Visit   None Visit Diagnoses     Nausea    -  Primary   Relevant Orders   CBC with Differential   Comprehensive metabolic panel   TSH   Fatigue,  unspecified type       Relevant Orders   CBC with Differential   Comprehensive metabolic panel   TSH   Dysuria       Relevant Orders   POCT URINALYSIS DIP (CLINITEK)   Urine Culture   Cystitis       Relevant Medications   ciprofloxacin (CIPRO) 500 MG tablet   fluconazole (DIFLUCAN) 150 MG tablet   Other Relevant Orders   Urine Culture     UA in office today positive for nitrates, leukocytes, and small amount of blood- consistent with UTI. Will begin treatment today and send for culture for verification.  I do still have some concerns that medication may be playing a role. Review of medication reveals several medications use CYP34A for metabolism, including her medication. Given the severity of headaches, would like to avoid stopping preventative treatment if this is an interaction with another medication. Will trial holding atorvastatin for 7 days to see if symptoms improve. If symptoms return with restart of medication, will need to consider changing the medication  or dosing schedule to avoid side effects. If symptoms do not return, we can attribute to the UTI. Increase water intake and monitor for any worsening symptoms or changes. Call for new symptoms immediately.     Meds ordered this encounter  Medications   ciprofloxacin (CIPRO) 500 MG tablet    Sig: Take 1 tablet (500 mg total) by mouth 2 (two) times daily.    Dispense:  10 tablet    Refill:  0   fluconazole (DIFLUCAN) 150 MG tablet    Sig: Take 1 tablet (150 mg total) by mouth once for 1 dose. Repeat dose 72 hours if yeast infection persists    Dispense:  2 tablet    Refill:  1    Follow-up: Return for TBD. - If symptoms worsen or fail to improve.    Tollie Eth, NP

## 2020-11-04 NOTE — Patient Instructions (Addendum)
Start the ciprofloxacin today and take twice a day for 5 days. You can take the diflucan on the last day of the antibiotic if you have symptoms. If you still have symptoms after 3 days, you can take the second dose. If not, hold on to the second dose in case you get symptoms in the future.   I would still like for you to stop the atorvastatin for one week- just to be safe  I will call you with the other lab results and we will determine the next steps.   If your symptoms change or get any worse, please let me know.   Drink lots of water :-)

## 2020-11-05 ENCOUNTER — Other Ambulatory Visit (HOSPITAL_BASED_OUTPATIENT_CLINIC_OR_DEPARTMENT_OTHER): Payer: Self-pay | Admitting: Nurse Practitioner

## 2020-11-05 DIAGNOSIS — F339 Major depressive disorder, recurrent, unspecified: Secondary | ICD-10-CM

## 2020-11-05 DIAGNOSIS — F411 Generalized anxiety disorder: Secondary | ICD-10-CM

## 2020-11-05 DIAGNOSIS — F331 Major depressive disorder, recurrent, moderate: Secondary | ICD-10-CM

## 2020-11-05 DIAGNOSIS — E782 Mixed hyperlipidemia: Secondary | ICD-10-CM

## 2020-11-05 DIAGNOSIS — R062 Wheezing: Secondary | ICD-10-CM

## 2020-11-05 DIAGNOSIS — F4310 Post-traumatic stress disorder, unspecified: Secondary | ICD-10-CM

## 2020-11-05 DIAGNOSIS — E038 Other specified hypothyroidism: Secondary | ICD-10-CM

## 2020-11-05 DIAGNOSIS — G43709 Chronic migraine without aura, not intractable, without status migrainosus: Secondary | ICD-10-CM

## 2020-11-05 DIAGNOSIS — K219 Gastro-esophageal reflux disease without esophagitis: Secondary | ICD-10-CM

## 2020-11-05 DIAGNOSIS — G5791 Unspecified mononeuropathy of right lower limb: Secondary | ICD-10-CM

## 2020-11-05 DIAGNOSIS — F515 Nightmare disorder: Secondary | ICD-10-CM

## 2020-11-05 DIAGNOSIS — R7303 Prediabetes: Secondary | ICD-10-CM

## 2020-11-05 DIAGNOSIS — F39 Unspecified mood [affective] disorder: Secondary | ICD-10-CM

## 2020-11-05 MED ORDER — BUPROPION HCL ER (XL) 150 MG PO TB24: 150.0000 mg | ORAL_TABLET | Freq: Every day | ORAL | 3 refills | Status: DC

## 2020-11-05 MED ORDER — LEVOTHYROXINE SODIUM 75 MCG PO TABS: 75.0000 ug | ORAL_TABLET | ORAL | 1 refills | Status: DC

## 2020-11-05 MED ORDER — ATORVASTATIN CALCIUM 20 MG PO TABS: 20.0000 mg | ORAL_TABLET | Freq: Every day | ORAL | 6 refills | Status: DC

## 2020-11-05 MED ORDER — METFORMIN HCL 500 MG PO TABS: 500.0000 mg | ORAL_TABLET | Freq: Two times a day (BID) | ORAL | 3 refills | Status: DC

## 2020-11-05 MED ORDER — QUETIAPINE FUMARATE 300 MG PO TABS: 300.0000 mg | ORAL_TABLET | Freq: Every day | ORAL | 3 refills | Status: DC

## 2020-11-05 MED ORDER — ONDANSETRON 4 MG PO TBDP: 4.0000 mg | ORAL_TABLET | Freq: Three times a day (TID) | ORAL | 6 refills | Status: DC | PRN

## 2020-11-05 MED ORDER — BUSPIRONE HCL 7.5 MG PO TABS: 7.5000 mg | ORAL_TABLET | Freq: Three times a day (TID) | ORAL | 3 refills | Status: DC

## 2020-11-05 MED ORDER — LEVOTHYROXINE SODIUM 50 MCG PO TABS: 50.0000 ug | ORAL_TABLET | ORAL | 1 refills | Status: DC

## 2020-11-05 MED ORDER — GABAPENTIN 600 MG PO TABS: 600.0000 mg | ORAL_TABLET | Freq: Three times a day (TID) | ORAL | 3 refills | Status: DC

## 2020-11-05 MED ORDER — FAMOTIDINE 20 MG PO TABS
20.0000 mg | ORAL_TABLET | Freq: Every day | ORAL | 3 refills | Status: DC | PRN
Start: 2020-11-05 — End: 2021-08-20

## 2020-11-05 MED ORDER — DULOXETINE HCL 60 MG PO CPEP: 60.0000 mg | ORAL_CAPSULE | Freq: Every day | ORAL | 3 refills | Status: DC

## 2020-11-05 MED ORDER — ALBUTEROL SULFATE HFA 108 (90 BASE) MCG/ACT IN AERS: INHALATION_SPRAY | RESPIRATORY_TRACT | 6 refills | Status: DC

## 2020-11-05 MED ORDER — RIZATRIPTAN BENZOATE 10 MG PO TBDP
10.0000 mg | ORAL_TABLET | ORAL | 3 refills | Status: DC | PRN
Start: 2020-11-05 — End: 2021-06-18

## 2020-11-06 ENCOUNTER — Ambulatory Visit: Payer: Medicare Other

## 2020-11-07 LAB — CBC WITH DIFFERENTIAL/PLATELET
Basophils Absolute: 0.1 10*3/uL (ref 0.0–0.2)
Basos: 1 %
EOS (ABSOLUTE): 0.3 10*3/uL (ref 0.0–0.4)
Eos: 3 %
Hematocrit: 36.1 % (ref 34.0–46.6)
Hemoglobin: 12.1 g/dL (ref 11.1–15.9)
Immature Grans (Abs): 0.1 10*3/uL (ref 0.0–0.1)
Immature Granulocytes: 1 %
Lymphocytes Absolute: 2.5 10*3/uL (ref 0.7–3.1)
Lymphs: 32 %
MCH: 26.2 pg — ABNORMAL LOW (ref 26.6–33.0)
MCHC: 33.5 g/dL (ref 31.5–35.7)
MCV: 78 fL — ABNORMAL LOW (ref 79–97)
Monocytes Absolute: 0.6 10*3/uL (ref 0.1–0.9)
Monocytes: 7 %
Neutrophils Absolute: 4.4 10*3/uL (ref 1.4–7.0)
Neutrophils: 56 %
Platelets: 334 10*3/uL (ref 150–450)
RBC: 4.61 x10E6/uL (ref 3.77–5.28)
RDW: 15.1 % (ref 11.7–15.4)
WBC: 7.8 10*3/uL (ref 3.4–10.8)

## 2020-11-07 LAB — COMPREHENSIVE METABOLIC PANEL
ALT: 16 IU/L (ref 0–32)
AST: 21 IU/L (ref 0–40)
Albumin/Globulin Ratio: 1.7 (ref 1.2–2.2)
Albumin: 4.5 g/dL (ref 3.8–4.8)
Alkaline Phosphatase: 105 IU/L (ref 44–121)
BUN/Creatinine Ratio: 12 (ref 12–28)
BUN: 17 mg/dL (ref 8–27)
Bilirubin Total: 0.3 mg/dL (ref 0.0–1.2)
CO2: 21 mmol/L (ref 20–29)
Calcium: 9.7 mg/dL (ref 8.7–10.3)
Chloride: 97 mmol/L (ref 96–106)
Creatinine, Ser: 1.39 mg/dL — ABNORMAL HIGH (ref 0.57–1.00)
Globulin, Total: 2.6 g/dL (ref 1.5–4.5)
Glucose: 96 mg/dL (ref 65–99)
Potassium: 4.8 mmol/L (ref 3.5–5.2)
Sodium: 135 mmol/L (ref 134–144)
Total Protein: 7.1 g/dL (ref 6.0–8.5)
eGFR: 42 mL/min/{1.73_m2} — ABNORMAL LOW (ref >59)

## 2020-11-07 LAB — URINE CULTURE

## 2020-11-07 LAB — TSH: TSH: 1.8 u[IU]/mL (ref 0.450–4.500)

## 2020-11-10 DIAGNOSIS — M19011 Primary osteoarthritis, right shoulder: Secondary | ICD-10-CM | POA: Diagnosis not present

## 2020-11-20 NOTE — Progress Notes (Signed)
Slight decrease in MCV, consistent with previous studies. Suspect ACD.  Kidney function improved.  Thyroid is normal.  Urine culture shows Klebsiella- susceptible to ciprofloxacin which was provided.

## 2020-11-28 ENCOUNTER — Encounter (HOSPITAL_BASED_OUTPATIENT_CLINIC_OR_DEPARTMENT_OTHER): Payer: Self-pay | Admitting: Nurse Practitioner

## 2020-12-08 DIAGNOSIS — M19211 Secondary osteoarthritis, right shoulder: Secondary | ICD-10-CM | POA: Diagnosis not present

## 2020-12-09 ENCOUNTER — Other Ambulatory Visit: Payer: Self-pay | Admitting: Orthopedic Surgery

## 2020-12-10 ENCOUNTER — Ambulatory Visit (HOSPITAL_COMMUNITY): Payer: Medicare Other

## 2020-12-12 ENCOUNTER — Telehealth: Payer: Self-pay | Admitting: *Deleted

## 2020-12-12 NOTE — Telephone Encounter (Signed)
   Primary Cardiologist: Armanda Magic, MD  Chart reviewed as part of pre-operative protocol coverage. Given past medical history and time since last visit, based on ACC/AHA guidelines, Jennifer Davidson would be at acceptable risk for the planned procedure without further cardiovascular testing.   Her aspirin may be held for 5-7 days prior to her surgery.  Please resume as soon as hemostasis is achieved.  Patient was advised that if she develops new symptoms prior to surgery to contact our office to arrange a follow-up appointment.  She verbalized understanding.  I will route this recommendation to the requesting party via Epic fax function and remove from pre-op pool.  Please call with questions.  Thomasene Ripple. Jakera Beaupre NP-C    12/12/2020, 4:19 PM [facility] [address] Office [phone] Fax [phone]

## 2020-12-12 NOTE — Telephone Encounter (Signed)
Left voice message [phone] on 12/12/2020.  Patient needs call back.

## 2020-12-12 NOTE — Telephone Encounter (Signed)
Pt is returning call from earlier today. Please advise 

## 2020-12-12 NOTE — Telephone Encounter (Signed)
   [facility] Pre-operative Risk Assessment    Patient Name: Jennifer Davidson  DOB: October 29, 1955 MRN: [id number]  HEARTCARE STAFF:  - IMPORTANT!!!!!! Under Visit Info/Reason for Call, type in Other and utilize the format Clearance MM/DD/YY or Clearance TBD. Do not use dashes or single digits. - Please review there is not already an duplicate clearance open for this procedure. - If request is for dental extraction, please clarify the # of teeth to be extracted. - If the patient is currently at the dentist's office, call Pre-Op Callback Staff (MA/nurse) to input urgent request.  - If the patient is not currently in the dentist office, please route to the Pre-Op pool.  Request for surgical clearance:  What type of surgery is being performed? RIGHT REVERSE SHOULDER ARTHROPLASTY  When is this surgery scheduled? 02/12/21  What type of clearance is required (medical clearance vs. Pharmacy clearance to hold med vs. Both)? MEDICAL  Are there any medications that need to be held prior to surgery and how long?  ASA  Practice name and name of physician performing surgery? [facility]; DR. [name]  What is the office phone number? [phone]   7.   What is the office fax number? [phone] ATTN: JUDY DANIELS  8.   Anesthesia type (None, local, MAC, general) ? CHOICE   [name] 12/12/2020, 1:02 PM  _________________________________________________________________   (provider comments below)

## 2021-01-05 ENCOUNTER — Other Ambulatory Visit (HOSPITAL_BASED_OUTPATIENT_CLINIC_OR_DEPARTMENT_OTHER): Payer: Self-pay

## 2021-01-05 DIAGNOSIS — F5105 Insomnia due to other mental disorder: Secondary | ICD-10-CM

## 2021-01-05 MED ORDER — PRAZOSIN HCL 5 MG PO CAPS: 10.0000 mg | ORAL_CAPSULE | Freq: Every day | ORAL | 0 refills | Status: DC

## 2021-01-05 NOTE — Telephone Encounter (Signed)
Patient requesting 90 day medication refill Patient next scheduled follow up is 10/07 Will authorize refill

## 2021-01-13 ENCOUNTER — Ambulatory Visit (INDEPENDENT_AMBULATORY_CARE_PROVIDER_SITE_OTHER): Payer: Medicare Other | Admitting: Family Medicine

## 2021-01-13 ENCOUNTER — Encounter (HOSPITAL_BASED_OUTPATIENT_CLINIC_OR_DEPARTMENT_OTHER): Payer: Self-pay

## 2021-01-13 ENCOUNTER — Telehealth (HOSPITAL_BASED_OUTPATIENT_CLINIC_OR_DEPARTMENT_OTHER): Payer: Self-pay | Admitting: Nurse Practitioner

## 2021-01-13 ENCOUNTER — Other Ambulatory Visit: Payer: Self-pay

## 2021-01-13 ENCOUNTER — Emergency Department (HOSPITAL_BASED_OUTPATIENT_CLINIC_OR_DEPARTMENT_OTHER)
Admission: EM | Admit: 2021-01-13 | Discharge: 2021-01-13 | Disposition: A | Discharge status: Home / Self Care | Payer: Medicare Other | Attending: Emergency Medicine | Admitting: Emergency Medicine

## 2021-01-13 ENCOUNTER — Encounter (HOSPITAL_BASED_OUTPATIENT_CLINIC_OR_DEPARTMENT_OTHER): Payer: Self-pay | Admitting: Family Medicine

## 2021-01-13 DIAGNOSIS — R443 Hallucinations, unspecified: Secondary | ICD-10-CM | POA: Diagnosis not present

## 2021-01-13 DIAGNOSIS — E039 Hypothyroidism, unspecified: Secondary | ICD-10-CM | POA: Diagnosis not present

## 2021-01-13 DIAGNOSIS — R112 Nausea with vomiting, unspecified: Secondary | ICD-10-CM | POA: Diagnosis not present

## 2021-01-13 DIAGNOSIS — I130 Hypertensive heart and chronic kidney disease with heart failure and stage 1 through stage 4 chronic kidney disease, or unspecified chronic kidney disease: Secondary | ICD-10-CM | POA: Diagnosis not present

## 2021-01-13 DIAGNOSIS — K529 Noninfective gastroenteritis and colitis, unspecified: Secondary | ICD-10-CM | POA: Diagnosis not present

## 2021-01-13 DIAGNOSIS — R197 Diarrhea, unspecified: Secondary | ICD-10-CM | POA: Diagnosis not present

## 2021-01-13 DIAGNOSIS — Z7984 Long term (current) use of oral hypoglycemic drugs: Secondary | ICD-10-CM | POA: Diagnosis not present

## 2021-01-13 DIAGNOSIS — I5030 Unspecified diastolic (congestive) heart failure: Secondary | ICD-10-CM | POA: Insufficient documentation

## 2021-01-13 DIAGNOSIS — N189 Chronic kidney disease, unspecified: Secondary | ICD-10-CM | POA: Insufficient documentation

## 2021-01-13 DIAGNOSIS — Z79899 Other long term (current) drug therapy: Secondary | ICD-10-CM | POA: Diagnosis not present

## 2021-01-13 DIAGNOSIS — R7303 Prediabetes: Secondary | ICD-10-CM | POA: Diagnosis not present

## 2021-01-13 DIAGNOSIS — Z96612 Presence of left artificial shoulder joint: Secondary | ICD-10-CM | POA: Diagnosis not present

## 2021-01-13 DIAGNOSIS — Z7982 Long term (current) use of aspirin: Secondary | ICD-10-CM | POA: Diagnosis not present

## 2021-01-13 HISTORY — DX: Noninfective gastroenteritis and colitis, unspecified: K52.9

## 2021-01-13 LAB — COMPREHENSIVE METABOLIC PANEL
ALT: 14 U/L (ref 0–44)
AST: 17 U/L (ref 15–41)
Albumin: 3.9 g/dL (ref 3.5–5.0)
Alkaline Phosphatase: 65 U/L (ref 38–126)
Anion gap: 7 (ref 5–15)
BUN: 14 mg/dL (ref 8–23)
CO2: 28 mmol/L (ref 22–32)
Calcium: 9.2 mg/dL (ref 8.9–10.3)
Chloride: 99 mmol/L (ref 98–111)
Creatinine, Ser: 1.44 mg/dL — ABNORMAL HIGH (ref 0.44–1.00)
GFR, Estimated: 40 mL/min — ABNORMAL LOW (ref >60)
Glucose, Bld: 114 mg/dL — ABNORMAL HIGH (ref 70–99)
Potassium: 4.1 mmol/L (ref 3.5–5.1)
Sodium: 134 mmol/L — ABNORMAL LOW (ref 135–145)
Total Bilirubin: 0.4 mg/dL (ref 0.3–1.2)
Total Protein: 6.8 g/dL (ref 6.5–8.1)

## 2021-01-13 LAB — CBC
HCT: 38.6 % (ref 36.0–46.0)
Hemoglobin: 12.3 g/dL (ref 12.0–15.0)
MCH: 26.5 pg (ref 26.0–34.0)
MCHC: 31.9 g/dL (ref 30.0–36.0)
MCV: 83.2 fL (ref 80.0–100.0)
Platelets: 286 10*3/uL (ref 150–400)
RBC: 4.64 MIL/uL (ref 3.87–5.11)
RDW: 16.7 % — ABNORMAL HIGH (ref 11.5–15.5)
WBC: 8.5 10*3/uL (ref 4.0–10.5)
nRBC: 0 % (ref 0.0–0.2)

## 2021-01-13 LAB — URINALYSIS, ROUTINE W REFLEX MICROSCOPIC
Bilirubin Urine: NEGATIVE
Glucose, UA: NEGATIVE mg/dL
Hgb urine dipstick: NEGATIVE
Ketones, ur: NEGATIVE mg/dL
Nitrite: NEGATIVE
Protein, ur: NEGATIVE mg/dL
Specific Gravity, Urine: 1.016 (ref 1.005–1.030)
pH: 6 (ref 5.0–8.0)

## 2021-01-13 LAB — MAGNESIUM: Magnesium: 1.9 mg/dL (ref 1.7–2.4)

## 2021-01-13 LAB — LIPASE, BLOOD: Lipase: 17 U/L (ref 11–51)

## 2021-01-13 MED ORDER — ONDANSETRON 8 MG PO TBDP: 8.0000 mg | ORAL_TABLET | Freq: Three times a day (TID) | ORAL | 0 refills | Status: DC | PRN

## 2021-01-13 MED ORDER — HALOPERIDOL LACTATE 5 MG/ML IJ SOLN
2.0000 mg | Freq: Once | INTRAMUSCULAR | Status: AC
  Administered 2021-01-13: 2 mg via INTRAVENOUS
  Filled 2021-01-13: qty 1

## 2021-01-13 MED ORDER — LACTATED RINGERS IV BOLUS
2000.0000 mL | Freq: Once | INTRAVENOUS | Status: AC
  Administered 2021-01-13: 2000 mL via INTRAVENOUS

## 2021-01-13 MED ORDER — ONDANSETRON HCL 4 MG/2ML IJ SOLN
4.0000 mg | Freq: Once | INTRAMUSCULAR | Status: AC
  Administered 2021-01-13: 4 mg via INTRAVENOUS
  Filled 2021-01-13: qty 2

## 2021-01-13 MED ORDER — PROMETHAZINE HCL 25 MG RE SUPP: 25.0000 mg | Freq: Four times a day (QID) | RECTAL | 0 refills | Status: DC | PRN

## 2021-01-13 NOTE — ED Notes (Signed)
Pt provided diet coke for PO challenge, per her request.  No further needs expressed.

## 2021-01-13 NOTE — Telephone Encounter (Signed)
Pt called after having her phone visit with Dr. de Peru this morning 8/30 she stated that he would tell her if she needed to go to ED to get fluids and she hasn't heard anything she would like a call regarding this.  Please advise.

## 2021-01-13 NOTE — Assessment & Plan Note (Signed)
Symptoms started about 2 weeks ago.  Initially developed headaches, general feeling of being unwell, vomiting.  This subsequently progressed to diarrhea which is described as watery, frequent throughout the day.  She has not been able to tolerate much in regards to food intake, has tolerated some liquid over the past couple days.  Has had some lightheadedness, dizziness, most notable when standing up.  She does have blood pressure cuff and scale at home and has checked these today.  Her blood pressure is lower than typical, systolic will usually be 140.  Her pulse is also elevated 111.  She denies having any fevers, no blood in the stool.  No associated abdominal pain.  No sick contacts that she is aware of.  She did travel to amusement park in [place] about 2 weeks prior to onset of symptoms. Objectively, patient sounds fatigued on the phone, but with normal breathing, no labored breaths.  Able to speak in complete sentences without appreciable shortness of breath. Uncertain etiology, likely viral, could also be unusual presentation for coronavirus infection Given duration of symptoms, notable vomiting and diarrhea with decreased p.o. intake as well as objective measures of low blood pressure and elevated pulse, discussed with patient my concerns and recommendation that she present to the emergency department for further evaluation and management.  Discussed that patient would likely receive intravenous fluids to help with hydration, likely check labs to assess for any derangement given poor p.o. intake Patient voiced understanding and agreement with plan of care Advised that she can always contact our office if she has any further questions or concerns

## 2021-01-13 NOTE — ED Triage Notes (Signed)
Pt arrives POV, c/o n/v x2 weeks, diarrhea for 1 week.  Has tried Imodium for diarrhea.  Reports some lightheadedness. Has had a cough for 3-4 weeks. Denies SOB.

## 2021-01-13 NOTE — ED Notes (Signed)
Patient verbalizes understanding of discharge instructions. Opportunity for questioning and answers were provided. Patient discharged from ED.  °

## 2021-01-13 NOTE — Telephone Encounter (Signed)
Patient was advised by Dr. de Peru that an evaluation at the ED would be his recommendations due to her duration of symptoms, lack of improvement, tachycardia, and GI discomfort Patient is aware and agreeable to going to [facility] ED

## 2021-01-13 NOTE — Discharge Instructions (Addendum)
You are seen in the ER for dehydration.  Fortunately, with your nausea, vomiting and diarrhea the organs have not been damaged yet.  You received some IV hydration here.  As discussed, the ER work-up is limited to emergent work-up only.  Given that your symptoms are recurrent, lasting for long duration, it might be a good idea to follow-up with your primary care doctor for this specific issue.  Prescribed her medication for supportive care.  Return to the ER if your symptoms get worse, you start having fevers, bloody stools.

## 2021-01-13 NOTE — ED Provider Notes (Signed)
[facility] Provider Note   CSN: [id number] Arrival date & time: 01/13/21  1253     History Chief Complaint  Patient presents with   Diarrhea   Nausea   Emesis    Jennifer Davidson is a 65 y.o. female.  HPI    65 year old female comes in with chief complaint of nausea, vomiting and diarrhea. Patient reports 5+ episodes of diarrhea daily and around 3-5 episodes of emesis.  Symptoms started with nausea and vomiting about 2 weeks ago and diarrhea about a week ago.  Patient denies any abdominal pain.  She reports that every few months she will have a bout of nausea, vomiting and diarrhea similar to this that will last for several days.  She has gotten dehydrated, and yesterday the husband noted some hallucinations prompting him to bring her to the ER for hydration.  Unclear etiology in the past.  Patient denies any recent infection, antibiotics, travel, exposures.  Review of system is positive for some dizziness.  Patient denies any UTI-like symptoms, blood in the stools.  She has history of abdominal hysterectomy.  Passing flatus.  No night sweats, unexplained weight loss.  Past Medical History:  Diagnosis Date   Acute cystitis without hematuria    Acute encephalopathy 06/25/2016   Altered mental status 06/23/2017   Anxiety    Anxiety and depression 02/04/2012   Arthritis    Cataract 08/31/2017   left eye   CHF (congestive heart failure) ([place])    resolved   Cholesterol serum increased    Chronic back pain    chronic Rt low back pain. s/p L4-5 fusion. failed Rt facet injections. poss due to Rt SI joint dysfunction.   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   Depression    Diastolic heart failure (HCC)    Diastolic heart failure ([place]) 03/17/2012   Edema 02/04/2012    Encounter for long-term (current) use of medications 02/04/2012   Fall as cause of accidental injury at home as place of occurrence 09/08/2018   GERD (gastroesophageal reflux disease)    GERD (gastroesophageal reflux disease) 11/13/2015   Hallucination, visual 09/14/2017   Head injury 09/08/2018   Hyperlipidemia    Hyperparathyroidism ([place])    Hyperparathyroidism, secondary renal ([place]) 07/31/2014   [facility] [name], MD  Plan:  25 Hydroxy Vit D (59.1 ng/ml)    Hypertension    Hypothyroidism    Lower back pain 09/21/2013   [facility]. [name], MD. 08/31/2013 L4-5 fusion. Right sacroiliac joint dysfunction may very well explain current symptoms.    Migraine    Moderate episode of recurrent major depressive disorder ([place]) 08/31/2016   Neuromuscular disorder (HCC)    PONV (postoperative nausea and vomiting)    Pre-diabetes    Recurrent UTI 06/25/2016   Seizures ([place])    3 years ago,06/20/19   Sepsis ([place]) 04/29/2020   Severe recurrent depression with psychosis ([place]) 06/25/2016   Severe recurrent major depressive disorder with psychotic features ([place])    Sleep apnea    cpap   Syncope 08/30/2015   Syncope and collapse 02/12/2012   Tachycardia    Tachycardia    Tachycardia 02/04/2012   Trochanteric bursitis of both hips 2012   Confirmed on MRI   URI (upper  respiratory infection) 09/27/2018    Patient Active Problem List   Diagnosis Date Noted   Gastroenteritis 01/13/2021   Allergies 05/06/2020   Rotator cuff tear arthropathy of left shoulder 10/20/2018   Arthritis of left acromioclavicular joint 10/20/2018   Osteoarthritis of left shoulder 10/20/2018   Prediabetes 09/14/2017   Dream anxiety disorder 09/14/2017   Vivid dream 09/14/2017   Sacroiliac joint dysfunction of right side 03/17/2017   PTSD (post-traumatic stress disorder) 08/31/2016   Gastroesophageal reflux disease without esophagitis 11/13/2015   Generalized  anxiety disorder 06/14/2015   Neuropathy of lower extremity 05/14/2015   Headache, migraine 01/17/2015   Major depression, recurrent, chronic ([place]) 12/02/2014   Mood disorder ([place]) 12/01/2014   Chronic back pain    Chronic kidney disease 02/04/2012   Hypothyroid 02/04/2012   Mixed hyperlipidemia 02/04/2012    Past Surgical History:  Procedure Laterality Date   ABDOMINAL HYSTERECTOMY     APPENDECTOMY  1986   BACK SURGERY     L4-5 fusion   CATARACT EXTRACTION     KNEE SURGERY     REVERSE SHOULDER ARTHROPLASTY Left 07/03/2020   Procedure: REVERSE SHOULDER ARTHROPLASTY;  Surgeon: [name], MD;  Location: [facility];  Service: Orthopedics;  Laterality: Left;   SHOULDER ARTHROSCOPY WITH SUBACROMIAL DECOMPRESSION Left 10/20/2018   Procedure: SHOULDER ARTHROSCOPY, ROTATOR CUFF DEBRIDEMENT, GLENOHUMERAL JOINT DEBRIDEMENT, ACROMIOPLASTY, DISTAL CLAVICLE RESECTION;  Surgeon: [name], MD;  Location: [facility];  Service: Orthopedics;  Laterality: Left;     OB History   No obstetric history on file.     Family History  Problem Relation Age of Onset   Diabetes Mother    Heart disease Mother    Kidney disease Mother    Thyroid disease Mother    Heart disease Father    Seizures Father    COPD Father    Irritable bowel syndrome Father    Cancer Maternal Grandmother    Diabetes Sister    Diabetes Brother    Colon cancer Neg Hx    Stomach cancer Neg Hx    Colon polyps Neg Hx    Esophageal cancer Neg Hx    Rectal cancer Neg Hx     Social History   Tobacco Use   Smoking status: Never   Smokeless tobacco: Never  Vaping Use   Vaping Use: Never used  Substance Use Topics   Alcohol use: Never   Drug use: Never    Comment: 08-31-2016 PER PT NO     Home Medications Prior to Admission medications   Medication Sig Start Date End Date Taking? Authorizing Provider  albuterol (VENTOLIN HFA) 108 (90 Base) MCG/ACT inhaler TAKE 2 PUFFS BY MOUTH EVERY 6 HOURS AS NEEDED FOR WHEEZE OR  SHORTNESS OF BREATH 11/05/20  Yes Early, Coralee Pesa, NP  aspirin EC 81 MG tablet Take 1 tablet (81 mg total) by mouth daily. 07/04/19  Yes Turner, Eber Hong, MD  blood glucose meter kit and supplies KIT Dispense based on patient and insurance preference. Use up to four times daily as directed. Check blood sugar three times weekly, then as needed. 07/28/20  Yes Early, Coralee Pesa, NP  buPROPion (WELLBUTRIN XL) 150 MG 24 hr tablet Take 1 tablet (150 mg total) by mouth daily. 11/05/20  Yes Early, Coralee Pesa, NP  busPIRone (BUSPAR) 7.5 MG tablet Take 1 tablet (7.5 mg total) by mouth 3 (three) times daily. 11/05/20  Yes Early, Coralee Pesa, NP  cetirizine (ZYRTEC) 10 MG tablet Take 1 tablet (10 mg total)  by mouth at bedtime. 05/06/20  Yes Just, Kelsea J, FNP  diclofenac sodium (VOLTAREN) 1 % GEL Apply 2 g topically daily as needed (for knee pain). 08/23/18  Yes Santiago Lago, Irma M, MD  DULoxetine (CYMBALTA) 60 MG capsule Take 1 capsule (60 mg total) by mouth daily. 11/05/20  Yes Early, Sara E, NP  famotidine (PEPCID) 20 MG tablet Take 1 tablet (20 mg total) by mouth daily as needed for heartburn or indigestion. 11/05/20  Yes Early, Sara E, NP  fluticasone (FLONASE) 50 MCG/ACT nasal spray PLACE 2 SPRAYS AT BEDTIME INTO BOTH NOSTRILS. Patient taking differently: Place 2 sprays into both nostrils at bedtime. PLACE 2 SPRAYS AT BEDTIME INTO BOTH NOSTRILS. 05/06/20  Yes Just, Kelsea J, FNP  gabapentin (NEURONTIN) 600 MG tablet Take 1 tablet (600 mg total) by mouth 3 (three) times daily. 11/05/20  Yes Early, Sara E, NP  hydrOXYzine (ATARAX/VISTARIL) 50 MG tablet Take 1 tablet (50 mg total) by mouth every 6 (six) hours as needed. 07/24/20  Yes Early, Sara E, NP  levothyroxine (SYNTHROID) 50 MCG tablet Take 1 tablet (50 mcg total) by mouth every other day. Before breakfast. Alternate with 75mcg tablet 11/05/20  Yes Early, Sara E, NP  levothyroxine (SYNTHROID) 75 MCG tablet Take 1 tablet (75 mcg total) by mouth every other day. Before breakfast.  Alternate with 50mcg tablet 11/05/20  Yes Early, Sara E, NP  meclizine (ANTIVERT) 50 MG tablet Take 0.5 tablets (25 mg total) by mouth 2 (two) times daily as needed for dizziness. 05/21/20  Yes Just, Kelsea J, FNP  metFORMIN (GLUCOPHAGE) 500 MG tablet Take 1 tablet (500 mg total) by mouth 2 (two) times daily with a meal. 11/05/20  Yes Early, Sara E, NP  Multiple Vitamins-Minerals (MULTIVITAMIN WITH MINERALS) tablet Take 1 tablet by mouth daily.   Yes [provider]  ondansetron (ZOFRAN ODT) 8 MG disintegrating tablet Take 1 tablet (8 mg total) by mouth every 8 (eight) hours as needed for nausea. 01/13/21  Yes , , MD  prazosin (MINIPRESS) 5 MG capsule Take 2-3 capsules (10-15 mg total) by mouth at bedtime. TAKE 2 TO 3 CAPSULES AT BEDTIME 01/05/21  Yes Early, Sara E, NP  Probiotic Product (ALIGN) 4 MG CAPS Take 1 capsule (4 mg total) by mouth daily. 04/17/20  Yes Haviland, Julie, MD  promethazine (PHENERGAN) 25 MG suppository Place 1 suppository (25 mg total) rectally every 6 (six) hours as needed for nausea. 01/13/21  Yes , , MD  QUEtiapine (SEROQUEL) 300 MG tablet Take 1 tablet (300 mg total) by mouth at bedtime. TAKE 1 TABLET BY MOUTH EVERYDAY AT BEDTIME 11/05/20  Yes Early, Sara E, NP  rizatriptan (MAXALT-MLT) 10 MG disintegrating tablet Take 1 tablet (10 mg total) by mouth as needed for migraine. May repeat in 2 hours if needed 11/05/20  Yes Early, Sara E, NP  zonisamide (ZONEGRAN) 100 MG capsule Take 1 capsule (100 mg total) by mouth daily. 11/03/20  Yes Jaffe, Adam R, DO    Allergies    Tramadol and Trazodone and nefazodone  Review of Systems   Review of Systems  Constitutional:  Positive for activity change.  Gastrointestinal:  Positive for diarrhea, nausea and vomiting.  Neurological:  Positive for dizziness.  Hematological:  Does not bruise/bleed easily.  All other systems reviewed and are negative.  Physical Exam Updated Vital Signs BP (!) 143/85 (BP  Location: Right Arm)   Pulse 76   Temp 97.9 F (36.6 C)   Resp 16     Ht 5' 2" (1.575 m)   Wt 77.1 kg   SpO2 100%   BMI 31.09 kg/m   Physical Exam Vitals and nursing note reviewed.  Constitutional:      Appearance: She is well-developed.  HENT:     Head: Atraumatic.     Mouth/Throat:     Mouth: Mucous membranes are dry.  Cardiovascular:     Rate and Rhythm: Normal rate.  Pulmonary:     Effort: Pulmonary effort is normal.  Musculoskeletal:     Cervical back: Normal range of motion and neck supple.  Skin:    General: Skin is warm and dry.  Neurological:     Mental Status: She is alert and oriented to person, place, and time.    ED Results / Procedures / Treatments   Labs (all labs ordered are listed, but only abnormal results are displayed) Labs Reviewed  COMPREHENSIVE METABOLIC PANEL - Abnormal; Notable for the following components:      Result Value   Sodium 134 (*)    Glucose, Bld 114 (*)    Creatinine, Ser 1.44 (*)    GFR, Estimated 40 (*)    All other components within normal limits  CBC - Abnormal; Notable for the following components:   RDW 16.7 (*)    All other components within normal limits  URINALYSIS, ROUTINE W REFLEX MICROSCOPIC - Abnormal; Notable for the following components:   Leukocytes,Ua MODERATE (*)    All other components within normal limits  LIPASE, BLOOD  MAGNESIUM    EKG None  Radiology No results found.  Procedures Procedures   Medications Ordered in ED Medications  ondansetron (ZOFRAN) injection 4 mg (has no administration in time range)  haloperidol lactate (HALDOL) injection 2 mg (has no administration in time range)  lactated ringers bolus 2,000 mL (2,000 mLs Intravenous New Bag/Given 01/13/21 1449)    ED Course  I have reviewed the triage vital signs and the nursing notes.  Pertinent labs & imaging results that were available during my care of the patient were reviewed by me and considered in my medical decision making  (see chart for details).    MDM Rules/Calculators/A&P                            65 year old comes in a chief complaint of nausea, vomiting and diarrhea.  No abdominal pain.  Passing flatus.  No fevers, chills and history is not suggestive of recent illnesses, antibiotic use.  Patient has had similar symptoms in the past, usually unprovoked and get better or course of several days.  Patient feels dehydrated, having dizziness and husband reports some hallucinations yesterday that got him concerned enough to bring her in for hydration.  Patient currently AOx3.  Abdominal exam is reassuring.  Mucosa is dry.  However, the labs are overall reassuring with no AKI.  Magnesium is also normal.  2 L of IV LR ordered upfront, with p.o. challenge initiated.  Incoming team will discharge if the p.o. challenge is successfully completed.  Final Clinical Impression(s) / ED Diagnoses Final diagnoses:  Non-intractable vomiting with nausea, unspecified vomiting type  Diarrhea, unspecified type    Rx / DC Orders ED Discharge Orders          Ordered    ondansetron (ZOFRAN ODT) 8 MG disintegrating tablet  Every 8 hours PRN        01/13/21 1558    promethazine (PHENERGAN) 25 MG suppository  Every 6  hours PRN        01/13/21 Valdez-[place], Pavielle Biggar, MD 01/14/21 [phone]

## 2021-01-13 NOTE — Progress Notes (Signed)
   Virtual Visit via Telephone   I connected with  VEARL AITKEN  on 01/13/21 by telephone and verified that I am speaking with the correct person using two identifiers.   I discussed the limitations, risks, security and privacy concerns of performing an evaluation and management service by telephone, including the higher likelihood of inaccurate diagnosis and treatment, and the availability of in person appointments.  We also discussed the likely need of an additional face to face encounter for complete and high quality delivery of care.  I also discussed with the patient that there may be a patient responsible charge related to this service. The patient expressed understanding and wishes to proceed.  Provider location is in medical facility. Patient location is at their home, different from provider location. People involved in care of the patient during this telehealth encounter were myself, my nurse/medical assistant, and my front office/scheduling team member.  Review of Systems: No fevers, chills, night sweats, chest pain, or shortness of breath.   Brief History, Exam, Impression, and Recommendations:    Blood pressure 100/61, pulse (!) 111, height 5\' 1"  (1.549 m), weight 178 lb (80.7 kg).  General: Speaking full sentences, no audible heavy breathing.  Sounds alert and appropriately interactive.  No problem-specific Assessment & Plan notes found for this encounter.  2 weeks Headache, not feeling good, vomiting, diarrhea Diarrhea watery and often during the day No food intake, able to tolerate some liquids No recent vomiting for a couple days No fevers, no blood in the stool Some lightheadedness, dizziness, notable when standing up No sick contacts with similar symptoms No abdominal pain   ___________________________________________ Bintou Lafata de , MD, ABFM, CAQSM Primary [facility]

## 2021-01-20 DIAGNOSIS — Z23 Encounter for immunization: Secondary | ICD-10-CM | POA: Diagnosis not present

## 2021-01-20 DIAGNOSIS — G4733 Obstructive sleep apnea (adult) (pediatric): Secondary | ICD-10-CM | POA: Diagnosis not present

## 2021-01-20 DIAGNOSIS — I129 Hypertensive chronic kidney disease with stage 1 through stage 4 chronic kidney disease, or unspecified chronic kidney disease: Secondary | ICD-10-CM | POA: Diagnosis not present

## 2021-01-20 DIAGNOSIS — N1831 Chronic kidney disease, stage 3a: Secondary | ICD-10-CM | POA: Diagnosis not present

## 2021-01-20 DIAGNOSIS — E039 Hypothyroidism, unspecified: Secondary | ICD-10-CM | POA: Diagnosis not present

## 2021-01-20 DIAGNOSIS — E785 Hyperlipidemia, unspecified: Secondary | ICD-10-CM | POA: Diagnosis not present

## 2021-01-20 DIAGNOSIS — Z9989 Dependence on other enabling machines and devices: Secondary | ICD-10-CM | POA: Diagnosis not present

## 2021-01-20 DIAGNOSIS — G709 Myoneural disorder, unspecified: Secondary | ICD-10-CM | POA: Diagnosis not present

## 2021-01-20 DIAGNOSIS — I5030 Unspecified diastolic (congestive) heart failure: Secondary | ICD-10-CM | POA: Diagnosis not present

## 2021-01-22 NOTE — Progress Notes (Signed)
Sent message, via epic in basket, requesting orders in epic from surgeon.  

## 2021-01-26 ENCOUNTER — Encounter (HOSPITAL_BASED_OUTPATIENT_CLINIC_OR_DEPARTMENT_OTHER): Payer: Self-pay | Admitting: Nurse Practitioner

## 2021-01-26 ENCOUNTER — Ambulatory Visit (INDEPENDENT_AMBULATORY_CARE_PROVIDER_SITE_OTHER): Payer: Medicare Other | Admitting: Nurse Practitioner

## 2021-01-26 ENCOUNTER — Other Ambulatory Visit: Payer: Self-pay

## 2021-01-26 VITALS — BP 128/67 | HR 98 | Ht 61.0 in | Wt 178.6 lb

## 2021-01-26 DIAGNOSIS — T7840XD Allergy, unspecified, subsequent encounter: Secondary | ICD-10-CM

## 2021-01-26 DIAGNOSIS — B3731 Acute candidiasis of vulva and vagina: Secondary | ICD-10-CM

## 2021-01-26 DIAGNOSIS — Z09 Encounter for follow-up examination after completed treatment for conditions other than malignant neoplasm: Secondary | ICD-10-CM | POA: Diagnosis not present

## 2021-01-26 DIAGNOSIS — N182 Chronic kidney disease, stage 2 (mild): Secondary | ICD-10-CM | POA: Diagnosis not present

## 2021-01-26 DIAGNOSIS — B373 Candidiasis of vulva and vagina: Secondary | ICD-10-CM

## 2021-01-26 MED ORDER — FLUCONAZOLE 150 MG PO TABS: 150.0000 mg | ORAL_TABLET | Freq: Once | ORAL | 3 refills | Status: DC

## 2021-01-26 MED ORDER — CETIRIZINE HCL 10 MG PO TABS: 10.0000 mg | ORAL_TABLET | Freq: Every day | ORAL | 3 refills | Status: DC

## 2021-01-26 NOTE — Progress Notes (Signed)
Established Patient Office Visit  Subjective:  Patient ID: Jennifer Davidson, female    DOB: [birth year]  Age: 65 y.o. MRN: [id number]  CC:  Chief Complaint  Patient presents with   Follow-up    ED follow up.  Patient states she is feeling and doing better since receiving IV fluids.    HPI [name] presents for hospital follow-up for dehydration secondary to diarrhea and vomiting.  0 days total stay. No AKI present. Labs OK. 2L LR given IV. Seen in ED and released.   Since release she tells me she is feeling much better. She has not had any further vomiting or diarrhea. She did have a visit with her nephrologist who took a urine sample and she reports this was normal on evaluation with them.  She has had continued dizziness, but this is chronic. She reports that ENT was planning to put in for vestibular rehab, but this has not been done. She would like to have this start after she has her shoulder surgery at the end of the month.  She will notify us when she is ready to have this done.   Past Medical History:  Diagnosis Date   Acute cystitis without hematuria    Acute encephalopathy 06/25/2016   Altered mental status 06/23/2017   Anxiety    Anxiety and depression 02/04/2012   Arthritis    Cataract 08/31/2017   left eye   CHF (congestive heart failure) ([place])    resolved   Cholesterol serum increased    Chronic back pain    chronic Rt low back pain. s/p L4-5 fusion. failed Rt facet injections. poss due to Rt SI joint dysfunction.   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   Depression    Diastolic heart failure (HCC)    Diastolic heart failure ([place]) 03/17/2012   Edema 02/04/2012   Encounter for long-term (current) use of medications 02/04/2012   Fall as cause of accidental injury at home as place  of occurrence 09/08/2018   GERD (gastroesophageal reflux disease)    GERD (gastroesophageal reflux disease) 11/13/2015   Hallucination, visual 09/14/2017   Head injury 09/08/2018   Hyperlipidemia    Hyperparathyroidism ([place])    Hyperparathyroidism, secondary renal ([place]) 07/31/2014   [facility] [name], MD  Plan:  25 Hydroxy Vit D (59.1 ng/ml)    Hypertension    Hypothyroidism    Lower back pain 09/21/2013   [facility]. [name], MD. 08/31/2013 L4-5 fusion. Right sacroiliac joint dysfunction may very well explain current symptoms.    Migraine    Moderate episode of recurrent major depressive disorder ([place]) 08/31/2016   Neuromuscular disorder (HCC)    PONV (postoperative nausea and vomiting)    Pre-diabetes    Recurrent UTI 06/25/2016   Seizures ([place])    3 years ago,06/20/19   Sepsis ([place]) 04/29/2020   Severe recurrent depression with psychosis ([place]) 06/25/2016   Severe recurrent major depressive disorder with psychotic features ([place])    Sleep apnea    cpap   Syncope 08/30/2015   Syncope and collapse 02/12/2012   Tachycardia    Tachycardia    Tachycardia 02/04/2012   Trochanteric bursitis of both hips 2012   Confirmed on MRI   URI (upper respiratory infection)  09/27/2018    Past Surgical History:  Procedure Laterality Date   ABDOMINAL HYSTERECTOMY     APPENDECTOMY  1986   BACK SURGERY     L4-5 fusion   CATARACT EXTRACTION     KNEE SURGERY     REVERSE SHOULDER ARTHROPLASTY Left 07/03/2020   Procedure: REVERSE SHOULDER ARTHROPLASTY;  Surgeon: [name], MD;  Location: [facility];  Service: Orthopedics;  Laterality: Left;   SHOULDER ARTHROSCOPY WITH SUBACROMIAL DECOMPRESSION Left 10/20/2018   Procedure: SHOULDER ARTHROSCOPY, ROTATOR CUFF DEBRIDEMENT, GLENOHUMERAL JOINT DEBRIDEMENT, ACROMIOPLASTY, DISTAL CLAVICLE RESECTION;  Surgeon: [name], MD;  Location: [facility];  Service: Orthopedics;  Laterality: Left;    Family  History  Problem Relation Age of Onset   Diabetes Mother    Heart disease Mother    Kidney disease Mother    Thyroid disease Mother    Heart disease Father    Seizures Father    COPD Father    Irritable bowel syndrome Father    Cancer Maternal Grandmother    Diabetes Sister    Diabetes Brother    Colon cancer Neg Hx    Stomach cancer Neg Hx    Colon polyps Neg Hx    Esophageal cancer Neg Hx    Rectal cancer Neg Hx     Social History   Socioeconomic History   Marital status: Married    Spouse name: Doctor, general practice   Number of children: 3   Years of education: 12   Highest education level: Some college, no degree  Occupational History   Occupation: Retired  Tobacco Use   Smoking status: Never   Smokeless tobacco: Never  Vaping Use   Vaping Use: Never used  Substance and Sexual Activity   Alcohol use: Never   Drug use: Never    Comment: 08-31-2016 PER PT NO    Sexual activity: Yes    Partners: Male    Comment: married  Other Topics Concern   Not on file  Social History Narrative   Right handed   Lives with husband one story   Three adult children that live close by. Close relationship with grandchildren    Social Determinants of Health   Financial Resource Strain: Low Risk    Difficulty of Paying Living Expenses: Not hard at all  Food Insecurity: No Food Insecurity   Worried About Charity fundraiser in the Last Year: Never true   Arboriculturist in the Last Year: Never true  Transportation Needs: No Transportation Needs   Lack of Transportation (Medical): No   Lack of Transportation (Non-Medical): No  Physical Activity: Insufficiently Active   Days of Exercise per Week: 4 days   Minutes of Exercise per Session: 10 min  Stress: Stress Concern Present   Feeling of Stress : Rather much  Social Connections: Moderately Isolated   Frequency of Communication with Friends and Family: More than three times a week   Frequency of Social Gatherings with Friends and Family:  More than three times a week   Attends Religious Services: Never   Marine scientist or Organizations: No   Attends Music therapist: Never   Marital Status: Married  Human resources officer Violence: Not At Risk   Fear of Current or Ex-Partner: No   Emotionally Abused: No   Physically Abused: No   Sexually Abused: No    Outpatient Medications Prior to Visit  Medication Sig Dispense Refill   atorvastatin (LIPITOR) 20 MG tablet Take 20 mg by mouth  daily.     fluconazole (DIFLUCAN) 150 MG tablet Take by mouth.     albuterol (VENTOLIN HFA) 108 (90 Base) MCG/ACT inhaler TAKE 2 PUFFS BY MOUTH EVERY 6 HOURS AS NEEDED FOR WHEEZE OR SHORTNESS OF BREATH 18 g 6   aspirin EC 81 MG tablet Take 1 tablet (81 mg total) by mouth daily. 90 tablet 3   blood glucose meter kit and supplies KIT Dispense based on patient and insurance preference. Use up to four times daily as directed. Check blood sugar three times weekly, then as needed. 1 each 0   BOOSTRIX 5-2.5-18.5 LF-MCG/0.5 injection      buPROPion (WELLBUTRIN XL) 150 MG 24 hr tablet Take 1 tablet (150 mg total) by mouth daily. 90 tablet 3   busPIRone (BUSPAR) 7.5 MG tablet Take 1 tablet (7.5 mg total) by mouth 3 (three) times daily. 270 tablet 3   diclofenac sodium (VOLTAREN) 1 % GEL Apply 2 g topically daily as needed (for knee pain). 100 g 3   DULoxetine (CYMBALTA) 60 MG capsule Take 1 capsule (60 mg total) by mouth daily. 90 capsule 3   famotidine (PEPCID) 20 MG tablet Take 1 tablet (20 mg total) by mouth daily as needed for heartburn or indigestion. 90 tablet 3   fluticasone (FLONASE) 50 MCG/ACT nasal spray PLACE 2 SPRAYS AT BEDTIME INTO BOTH NOSTRILS. (Patient taking differently: Place 2 sprays into both nostrils at bedtime. PLACE 2 SPRAYS AT BEDTIME INTO BOTH NOSTRILS.) 16 g 5   gabapentin (NEURONTIN) 600 MG tablet Take 1 tablet (600 mg total) by mouth 3 (three) times daily. (Patient taking differently: Take 600 mg by mouth 2 (two) times  daily.) 270 tablet 3   hydrOXYzine (ATARAX/VISTARIL) 50 MG tablet Take 1 tablet (50 mg total) by mouth every 6 (six) hours as needed. 360 tablet 2   levothyroxine (SYNTHROID) 50 MCG tablet Take 1 tablet (50 mcg total) by mouth every other day. Before breakfast. Alternate with 15mg tablet 90 tablet 1   levothyroxine (SYNTHROID) 75 MCG tablet Take 1 tablet (75 mcg total) by mouth every other day. Before breakfast. Alternate with 543m tablet 90 tablet 1   meclizine (ANTIVERT) 50 MG tablet Take 0.5 tablets (25 mg total) by mouth 2 (two) times daily as needed for dizziness. 60 tablet 3   metFORMIN (GLUCOPHAGE) 500 MG tablet Take 1 tablet (500 mg total) by mouth 2 (two) times daily with a meal. 180 tablet 3   Multiple Vitamins-Minerals (MULTIVITAMIN WITH MINERALS) tablet Take 1 tablet by mouth daily.     prazosin (MINIPRESS) 5 MG capsule Take 2-3 capsules (10-15 mg total) by mouth at bedtime. TAKE 2 TO 3 CAPSULES AT BEDTIME 270 capsule 0   Probiotic Product (ALIGN) 4 MG CAPS Take 1 capsule (4 mg total) by mouth daily. 30 capsule 0   promethazine (PHENERGAN) 25 MG suppository Place 1 suppository (25 mg total) rectally every 6 (six) hours as needed for nausea. 12 each 0   QUEtiapine (SEROQUEL) 300 MG tablet Take 1 tablet (300 mg total) by mouth at bedtime. TAKE 1 TABLET BY MOUTH EVERYDAY AT BEDTIME 90 tablet 3   rizatriptan (MAXALT-MLT) 10 MG disintegrating tablet Take 1 tablet (10 mg total) by mouth as needed for migraine. May repeat in 2 hours if needed 36 tablet 3   SHINGRIX injection      zonisamide (ZONEGRAN) 100 MG capsule Take 1 capsule (100 mg total) by mouth daily. 30 capsule 5   cetirizine (ZYRTEC) 10 MG tablet Take 1  tablet (10 mg total) by mouth at bedtime. 90 tablet 3   ondansetron (ZOFRAN ODT) 8 MG disintegrating tablet Take 1 tablet (8 mg total) by mouth every 8 (eight) hours as needed for nausea. 20 tablet 0   No facility-administered medications prior to visit.    Allergies  Allergen  Reactions   Tramadol Nausea And Vomiting   Trazodone And Nefazodone Other (See Comments)    Per pt trazodone caused hallucinations and behavior changes     ROS Review of Systems  Constitutional:  Negative for activity change, chills, diaphoresis, fatigue and fever.  Respiratory:  Negative for chest tightness and shortness of breath.   Cardiovascular:  Negative for chest pain.  Gastrointestinal:  Negative for abdominal distention, abdominal pain, blood in stool, diarrhea, nausea and vomiting.  Endocrine: Negative for polyuria.  Neurological:  Negative for weakness and light-headedness.  All other systems reviewed and are negative.    Objective:    Physical Exam Vitals and nursing note reviewed.  Constitutional:      Appearance: Normal appearance.  HENT:     Head: Normocephalic.  Eyes:     Extraocular Movements: Extraocular movements intact.     Conjunctiva/sclera: Conjunctivae normal.     Pupils: Pupils are equal, round, and reactive to light.  Neck:     Vascular: No carotid bruit.  Cardiovascular:     Rate and Rhythm: Normal rate and regular rhythm.     Pulses: Normal pulses.     Heart sounds: Normal heart sounds.  Pulmonary:     Effort: Pulmonary effort is normal.     Breath sounds: Normal breath sounds.  Abdominal:     General: Abdomen is flat. Bowel sounds are normal. There is no distension.     Palpations: Abdomen is soft. There is no mass.     Tenderness: There is no abdominal tenderness. There is no right CVA tenderness, left CVA tenderness, guarding or rebound.     Hernia: A hernia is present. Hernia is present in the ventral area.  Musculoskeletal:        General: Normal range of motion.     Cervical back: Normal range of motion.     Right lower leg: No edema.     Left lower leg: No edema.  Lymphadenopathy:     Cervical: No cervical adenopathy.  Skin:    General: Skin is warm and dry.     Capillary Refill: Capillary refill takes less than 2 seconds.   Neurological:     General: No focal deficit present.     Mental Status: She is alert and oriented to person, place, and time.  Psychiatric:        Mood and Affect: Mood normal.        Behavior: Behavior normal.        Judgment: Judgment normal.    BP 128/67   Pulse 98   Ht 5' 1" (1.549 m)   Wt 178 lb 9.6 oz (81 kg)   SpO2 100%   BMI 33.75 kg/m  Wt Readings from Last 3 Encounters:  01/26/21 178 lb 9.6 oz (81 kg)  01/13/21 170 lb (77.1 kg)  01/13/21 178 lb (80.7 kg)     Health Maintenance Due  Topic Date Due   Hepatitis C Screening  Never done   Zoster Vaccines- Shingrix (1 of 2) Never done   TETANUS/TDAP  05/17/2018   PNA vac Low Risk Adult (2 of 2 - PPSV23) 02/19/2020   COVID-19 Vaccine (4 - Booster  for Moderna series) 07/29/2020    There are no preventive care reminders to display for this patient.  Lab Results  Component Value Date   TSH 1.800 11/04/2020   Lab Results  Component Value Date   WBC 8.5 01/13/2021   HGB 12.3 01/13/2021   HCT 38.6 01/13/2021   MCV 83.2 01/13/2021   PLT 286 01/13/2021   Lab Results  Component Value Date   NA 134 (L) 01/13/2021   K 4.1 01/13/2021   CO2 28 01/13/2021   GLUCOSE 114 (H) 01/13/2021   BUN 14 01/13/2021   CREATININE 1.44 (H) 01/13/2021   BILITOT 0.4 01/13/2021   ALKPHOS 65 01/13/2021   AST 17 01/13/2021   ALT 14 01/13/2021   PROT 6.8 01/13/2021   ALBUMIN 3.9 01/13/2021   CALCIUM 9.2 01/13/2021   ANIONGAP 7 01/13/2021   EGFR 42 (L) 11/04/2020   Lab Results  Component Value Date   CHOL 171 07/24/2020   Lab Results  Component Value Date   HDL 60 07/24/2020   Lab Results  Component Value Date   LDLCALC 80 07/24/2020   Lab Results  Component Value Date   TRIG 153 (H) 07/24/2020   Lab Results  Component Value Date   CHOLHDL 2.9 07/24/2020   Lab Results  Component Value Date   HGBA1C 5.7 (H) 07/24/2020      Assessment & Plan:   Problem List Items Addressed This Visit     Chronic kidney  disease (Chronic)    Labs reviewed from most recent visit to the emergency department. Stable at this time. Continue to follow-up with nephrology. Recommend avoiding dehydration to prevent further kidney damage.      Allergies    Well-controlled on daily cetirizine 10 mg. Refills provided today. Follow-up symptoms worsen or fail to improve.      Relevant Medications   cetirizine (ZYRTEC) 10 MG tablet   Hospital discharge follow-up - Primary    ED visit on 01/13/2021 for nausea, vomiting, dehydration. Patient received 2 L of LR in the emergency department and labs were performed. Labs were reportedly stable.  These have been reviewed today. All symptoms have resolved. Recommend quick response to vomiting and diarrhea given risk of dehydration and her kidney function. Follow-up if symptoms return.      Other Visit Diagnoses     Vaginal candida       Relevant Medications   SHINGRIX injection   fluconazole (DIFLUCAN) 150 MG tablet       Meds ordered this encounter  Medications   fluconazole (DIFLUCAN) 150 MG tablet    Sig: Take 1 tablet (150 mg total) by mouth once for 1 dose.    Dispense:  1 tablet    Refill:  3   cetirizine (ZYRTEC) 10 MG tablet    Sig: Take 1 tablet (10 mg total) by mouth daily.    Dispense:  90 tablet    Refill:  3     Follow-up: Return if symptoms worsen or fail to improve.    Orma Render, NP

## 2021-01-26 NOTE — Assessment & Plan Note (Signed)
>>  ASSESSMENT AND PLAN FOR CHRONIC KIDNEY DISEASE WRITTEN ON 01/26/2021  5:16 PM BY Kileigh Ortmann E, NP  Labs reviewed from most recent visit to the emergency department. Stable at this time. Continue to follow-up with nephrology. Recommend avoiding dehydration to prevent further kidney damage.

## 2021-01-26 NOTE — Assessment & Plan Note (Signed)
ED visit on 01/13/2021 for nausea, vomiting, dehydration. Patient received 2 L of LR in the emergency department and labs were performed. Labs were reportedly stable.  These have been reviewed today. All symptoms have resolved. Recommend quick response to vomiting and diarrhea given risk of dehydration and her kidney function. Follow-up if symptoms return.

## 2021-01-26 NOTE — Assessment & Plan Note (Signed)
Well-controlled on daily cetirizine 10 mg. Refills provided today. Follow-up symptoms worsen or fail to improve.

## 2021-01-26 NOTE — Patient Instructions (Signed)
Let me know once your surgery is over and we will put in for the PT for the ear. This is called vestibular physical therapy.   Good luck with your surgery!! Let me know if you have any needs.  If you begin to feel ill again, please let me know immediately.

## 2021-01-26 NOTE — Assessment & Plan Note (Signed)
Labs reviewed from most recent visit to the emergency department. Stable at this time. Continue to follow-up with nephrology. Recommend avoiding dehydration to prevent further kidney damage.

## 2021-01-30 NOTE — Patient Instructions (Addendum)
DUE TO COVID-19 ONLY ONE VISITOR IS ALLOWED TO COME WITH YOU AND STAY IN THE WAITING ROOM ONLY DURING PRE OP AND PROCEDURE DAY OF SURGERY IF YOU ARE GOING HOME AFTER SURGERY. IF YOU ARE SPENDING THE NIGHT 2 PEOPLE MAY VISIT WITH YOU IN YOUR PRIVATE ROOM AFTER SURGERY UNTIL VISITING  HOURS ARE OVER AT 800 PM AND THE 2 VISITORS CANNOT SPEND THE NIGHT.                  Jennifer Davidson     Your procedure is scheduled on: 02/12/21   Report to [facility] Main  Entrance   Report to admitting at  6:50 AM     Call this number if you have problems the morning of surgery [phone]    No food after midnight.    You may have clear liquid until 6:30 AM.    At 6:00 AM drink pre surgery drink.   Nothing by mouth after 6:30 AM.   CLEAR LIQUID DIET   Foods Allowed                                                                                       Foods Excluded  Coffee and tea, regular and decaf    No Milk or Creamer                         liquids that you cannot  Plain Jell-O any favor except red or purple                                           see through such as: Fruit ices (not with fruit pulp)                                                                    milk, soups, orange juice  Iced Popsicles                                                                   All solid food Carbonated beverages, regular and diet                                    Cranberry, grape and apple juices Sports drinks like Gatorade Lightly seasoned clear broth or consume(fat free) Sugar  BRUSH YOUR TEETH MORNING OF SURGERY AND RINSE YOUR MOUTH OUT, NO CHEWING GUM CANDY OR MINTS.     Take these medicines the morning of surgery with  A SIP OF WATER: Buspirone, Zonisamide, Wellbutrin, Cymbalta, Levothyroxine               Use your inhaler and bring it with you  Stop taking __ASA_________on __9/22________as instructed by __Dr. Chandler___________.   DO NOT TAKE ANY DIABETIC MEDICATIONS  DAY OF YOUR SURGERY                               You may not have any metal on your body including hair pins and              piercings  Do not wear jewelry, make-up, lotions, powders or perfumes, deodorant             Do not wear nail polish on your fingernails.  Do not shave  48 hours prior to surgery.              Do not bring valuables to the hospital. [facility] IS NOT             RESPONSIBLE   FOR VALUABLES.  Contacts, dentures or bridgework may not be worn into surgery.  Leave suitcase in the car. After surgery it may be brought to your room.     Patients discharged the day of surgery will not be allowed to drive home.  IF YOU ARE HAVING SURGERY AND GOING HOME THE SAME DAY, YOU MUST HAVE AN ADULT TO DRIVE YOU HOME AND BE WITH YOU FOR 24 HOURS.  YOU MAY GO HOME BY TAXI OR UBER OR ORTHERWISE, BUT AN ADULT MUST ACCOMPANY YOU HOME AND STAY WITH YOU FOR 24 HOURS.  Name and phone number of your driver:  Special Instructions: N/A              Please read over the following fact sheets you were given: _____________________________________________________________________  [facility]- Preparing for Total Shoulder Arthroplasty    Before surgery, you can play an important role. Because skin is not sterile, your skin needs to be as free of germs as possible. You can reduce the number of germs on your skin by using the following products. Benzoyl Peroxide Gel Reduces the number of germs present on the skin Applied twice a day to shoulder area starting two days before surgery    ==================================================================  Please follow these instructions carefully:  BENZOYL PEROXIDE 5% GEL  Please do not use if you have an allergy to benzoyl peroxide.   If your skin becomes reddened/irritated stop using the benzoyl peroxide.  Starting two days before surgery, apply as follows: Apply benzoyl peroxide in the morning and at night. Apply after taking a shower. If  you are not taking a shower clean entire shoulder front, back, and side along with the armpit with a clean wet washcloth.  Place a quarter-sized dollop on your shoulder and rub in thoroughly, making sure to cover the front, back, and side of your shoulder, along with the armpit.   2 days before ____ AM   ____ PM              1 day before ____ AM   ____ PM                         Do this twice a day for two days.  (Last application is the night before surgery, AFTER using the CHG soap as described below).  Do NOT apply benzoyl peroxide  gel on the day of surgery.            [facility] - Preparing for Surgery Before surgery, you can play an important role.  Because skin is not sterile, your skin needs to be as free of germs as possible.  You can reduce the number of germs on your skin by washing with CHG (chlorahexidine gluconate) soap before surgery.  CHG is an antiseptic cleaner which kills germs and bonds with the skin to continue killing germs even after washing. Please DO NOT use if you have an allergy to CHG or antibacterial soaps.  If your skin becomes reddened/irritated stop using the CHG and inform your nurse when you arrive at Short Stay. Do not shave (including legs and underarms) for at least 48 hours prior to the first CHG shower. Please follow these instructions carefully:  1.  Shower with CHG Soap the night before surgery and the  morning of Surgery.  2.  If you choose to wash your hair, wash your hair first as usual with your  normal  shampoo.  3.  After you shampoo, rinse your hair and body thoroughly to remove the  shampoo.                            4.  Use CHG as you would any other liquid soap.  You can apply chg directly  to the skin and wash                       Gently with a scrungie or clean washcloth.  5.  Apply the CHG Soap to your body ONLY FROM THE NECK DOWN.   Do not use on face/ open                           Wound or open sores. Avoid contact with eyes, ears mouth  and genitals (private parts).                       Wash face,  Genitals (private parts) with your normal soap.             6.  Wash thoroughly, paying special attention to the area where your surgery  will be performed.  7.  Thoroughly rinse your body with warm water from the neck down.  8.  DO NOT shower/wash with your normal soap after using and rinsing off  the CHG Soap.                9.  Pat yourself dry with a clean towel.            10.  Wear clean pajamas.            11.  Place clean sheets on your bed the night of your first shower and do not  sleep with pets. Day of Surgery : Do not apply any lotions/deodorants the morning of surgery.  Please wear clean clothes to the hospital/surgery center.  FAILURE TO FOLLOW THESE INSTRUCTIONS MAY RESULT IN THE CANCELLATION OF YOUR SURGERY PATIENT SIGNATURE_________________________________  NURSE SIGNATURE__________________________________  ________________________________________________________________________   Jennifer Davidson  An incentive spirometer is a tool that can help keep your lungs clear and active. This tool measures how well you are filling your lungs with each breath. Taking long deep breaths may help reverse or decrease the chance of developing  breathing (pulmonary) problems (especially infection) following: A long period of time when you are unable to move or be active. BEFORE THE PROCEDURE  If the spirometer includes an indicator to show your best effort, your nurse or respiratory therapist will set it to a desired goal. If possible, sit up straight or lean slightly forward. Try not to slouch. Hold the incentive spirometer in an upright position. INSTRUCTIONS FOR USE  Sit on the edge of your bed if possible, or sit up as far as you can in bed or on a chair. Hold the incentive spirometer in an upright position. Breathe out normally. Place the mouthpiece in your mouth and seal your lips tightly around it. Breathe in  slowly and as deeply as possible, raising the piston or the ball toward the top of the column. Hold your breath for 3-5 seconds or for as long as possible. Allow the piston or ball to fall to the bottom of the column. Remove the mouthpiece from your mouth and breathe out normally. Rest for a few seconds and repeat Steps 1 through 7 at least 10 times every 1-2 hours when you are awake. Take your time and take a few normal breaths between deep breaths. The spirometer may include an indicator to show your best effort. Use the indicator as a goal to work toward during each repetition. After each set of 10 deep breaths, practice coughing to be sure your lungs are clear. If you have an incision (the cut made at the time of surgery), support your incision when coughing by placing a pillow or rolled up towels firmly against it. Once you are able to get out of bed, walk around indoors and cough well. You may stop using the incentive spirometer when instructed by your caregiver.  RISKS AND COMPLICATIONS Take your time so you do not get dizzy or light-headed. If you are in pain, you may need to take or ask for pain medication before doing incentive spirometry. It is harder to take a deep breath if you are having pain. AFTER USE Rest and breathe slowly and easily. It can be helpful to keep track of a log of your progress. Your caregiver can provide you with a simple table to help with this. If you are using the spirometer at home, follow these instructions: SEEK MEDICAL CARE IF:  You are having difficultly using the spirometer. You have trouble using the spirometer as often as instructed. Your pain medication is not giving enough relief while using the spirometer. You develop fever of 100.5 F (38.1 C) or higher. SEEK IMMEDIATE MEDICAL CARE IF:  You cough up bloody sputum that had not been present before. You develop fever of 102 F (38.9 C) or greater. You develop worsening pain at or near the incision  site. MAKE SURE YOU:  Understand these instructions. Will watch your condition. Will get help right away if you are not doing well or get worse. Document Released: 09/13/2006 Document Revised: 07/26/2011 Document Reviewed: 11/14/2006 [facility] Patient Information 2014 [place], [place].   ________________________________________________________________________

## 2021-01-30 NOTE — Progress Notes (Signed)
I need orders in Epic for Jennifer Davidson DOB 03/29/1956 for a PAT visit on Monday 02/02/21.  DOS 02/12/21. Thank you

## 2021-02-02 ENCOUNTER — Other Ambulatory Visit: Payer: Self-pay

## 2021-02-02 ENCOUNTER — Encounter (HOSPITAL_COMMUNITY)
Admission: RE | Admit: 2021-02-02 | Discharge: 2021-02-02 | Disposition: A | Discharge status: Home / Self Care | Payer: Medicare Other | Source: Ambulatory Visit | Attending: Orthopedic Surgery | Admitting: Orthopedic Surgery

## 2021-02-02 ENCOUNTER — Encounter (HOSPITAL_COMMUNITY): Payer: Self-pay

## 2021-02-02 DIAGNOSIS — Z01812 Encounter for preprocedural laboratory examination: Secondary | ICD-10-CM | POA: Insufficient documentation

## 2021-02-02 HISTORY — DX: Type 2 diabetes mellitus without complications: E11.9

## 2021-02-02 LAB — SURGICAL PCR SCREEN
MRSA, PCR: NEGATIVE
Staphylococcus aureus: NEGATIVE

## 2021-02-02 LAB — CBC
HCT: 39.8 % (ref 36.0–46.0)
Hemoglobin: 13 g/dL (ref 12.0–15.0)
MCH: 26.7 pg (ref 26.0–34.0)
MCHC: 32.7 g/dL (ref 30.0–36.0)
MCV: 81.9 fL (ref 80.0–100.0)
Platelets: 305 10*3/uL (ref 150–400)
RBC: 4.86 MIL/uL (ref 3.87–5.11)
RDW: 15 % (ref 11.5–15.5)
WBC: 9.7 10*3/uL (ref 4.0–10.5)
nRBC: 0 % (ref 0.0–0.2)

## 2021-02-02 LAB — BASIC METABOLIC PANEL
Anion gap: 10 (ref 5–15)
BUN: 20 mg/dL (ref 8–23)
CO2: 25 mmol/L (ref 22–32)
Calcium: 9.9 mg/dL (ref 8.9–10.3)
Chloride: 95 mmol/L — ABNORMAL LOW (ref 98–111)
Creatinine, Ser: 1.65 mg/dL — ABNORMAL HIGH (ref 0.44–1.00)
GFR, Estimated: 34 mL/min — ABNORMAL LOW (ref >60)
Glucose, Bld: 97 mg/dL (ref 70–99)
Potassium: 4.6 mmol/L (ref 3.5–5.1)
Sodium: 130 mmol/L — ABNORMAL LOW (ref 135–145)

## 2021-02-02 LAB — HEMOGLOBIN A1C
Hgb A1c MFr Bld: 5.5 % (ref 4.8–5.6)
Mean Plasma Glucose: 111.15 mg/dL

## 2021-02-02 LAB — GLUCOSE, CAPILLARY: Glucose-Capillary: 106 mg/dL — ABNORMAL HIGH (ref 70–99)

## 2021-02-02 NOTE — Progress Notes (Signed)
COVID test Completed:NA   PCP - S. Early NP LOV 01/26/21 Cardiologist - Dr. Kym Groom   Chest x-ray - no EKG - 05/20/20-epic Stress Test - 06/09/20-epic ECHO - 06/09/20-epic Cardiac Cath - no Pacemaker/ICD device last checked:NA  Sleep Study - yes CPAP - Yes  Fasting Blood Sugar - 86-128 Checks Blood Sugar _____ times a day. Every other day  Blood Thinner Instructions:ASA 81 / Dr. Huntley Dec Early Aspirin Instructions:stop 7 days prior to DOS / Dr. Ave Filter Last Dose:9/22  Anesthesia review: yes  Patient denies shortness of breath, fever, cough and chest pain at PAT appointment yes Pt reports no SOB with activity but does need an inhaler sometimes. She has chronic syncope from her Rt ear. Last seizure was 2018  Patient verbalized understanding of instructions that were given to them at the PAT appointment. Patient was also instructed that they will need to review over the PAT instructions again at home before surgery. yes

## 2021-02-03 ENCOUNTER — Encounter (HOSPITAL_COMMUNITY): Payer: Self-pay | Admitting: Physician Assistant

## 2021-02-03 NOTE — Progress Notes (Signed)
Anesthesia Chart Review   Case: [id number] Date/Time: 02/12/21 6387   Procedure: REVERSE SHOULDER ARTHROPLASTY (Right: Shoulder)   Anesthesia type: Choice   Pre-op diagnosis: RIGHT SHOULDER ROTATOR CUFF TEAR ARTHROPATHY   Location[facility] ROOM 07 / [facility]   Surgeons: [name], MD       DISCUSSION:65 y.o. never smoker with h/o GERD, PONV, CKD, sleep apnea with cpap, DM II, CHF, right shoulder rotator cuff tear scheduled for above procedure 02/12/2021 with Dr. [name].   Pt seen in the ED for n/v/d 01/13/2021. F/u with PCP 01/26/2021, sx had resolved at that visit.   Per cardiology preoperative evaluation 12/12/2020. Per OV note, "Chart reviewed as part of pre-operative protocol coverage. Given past medical history and time since last visit, based on ACC/AHA guidelines, [name] RYTHER would be at acceptable risk for the planned procedure without further cardiovascular testing.    Her aspirin may be held for 5-7 days prior to her surgery.  Please resume as soon as hemostasis is achieved."  VS: BP (!) 89/63   Pulse 99   Temp 36.6 C (Oral)   Resp 20   Ht 5' 3"  (1.6 m)   Wt 79.4 kg   SpO2 98%   BMI 31.00 kg/m   PROVIDERS: Early, Coralee Pesa, NP is PCP    LABS: Labs reviewed: Acceptable for surgery. (all labs ordered are listed, but only abnormal results are displayed)  Labs Reviewed  BASIC METABOLIC PANEL - Abnormal; Notable for the following components:      Result Value   Sodium 130 (*)    Chloride 95 (*)    Creatinine, Ser 1.65 (*)    GFR, Estimated 34 (*)    All other components within normal limits  GLUCOSE, CAPILLARY - Abnormal; Notable for the following components:   Glucose-Capillary 106 (*)    All other components within normal limits  SURGICAL PCR SCREEN  CBC  HEMOGLOBIN A1C     IMAGES:   EKG: 05/20/2020 Rate 105 bpm   CV: Echo 06/09/2020  1. Left ventricular ejection fraction, by estimation, is 60 to 65%. The  left ventricle has normal function. The  left ventricle has no regional  wall motion abnormalities. There is mild concentric left ventricular  hypertrophy. Left ventricular diastolic  parameters are consistent with Grade I diastolic dysfunction (impaired  relaxation).   2. Right ventricular systolic function is normal. The right ventricular  size is normal. Tricuspid regurgitation signal is inadequate for assessing  PA pressure.   3. The mitral valve is normal in structure. Trivial mitral valve  regurgitation.   4. The aortic valve is tricuspid. Aortic valve regurgitation is trivial.   5. The inferior vena cava is normal in size with greater than 50%  respiratory variability, suggesting right atrial pressure of 3 mmHg.  Stress Test 06/09/2020 The left ventricular ejection fraction is hyperdynamic (>65%). Nuclear stress EF: 66%. There was no ST segment deviation noted during stress. No T wave inversion was noted during stress. The study is normal. This is a low risk study.   1. Normal study without ischemia or infarction.  2. Normal LVEF, >65%.  3. This is a low-risk study. Past Medical History:  Diagnosis Date   Acute cystitis without hematuria    Acute encephalopathy 06/25/2016   Altered mental status 06/23/2017   Anxiety    Anxiety and depression 02/04/2012   Arthritis    Cataract 08/31/2017   left eye   CHF (congestive heart failure) ([place]) 2017  resolved   Cholesterol serum increased    Chronic back pain    chronic Rt low back pain. s/p L4-5 fusion. failed Rt facet injections. poss due to Rt SI joint dysfunction.   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   Depression    Diabetes mellitus without complication ([place])    Diastolic heart failure ([place]) 03/17/2012   Edema 02/04/2012   Encounter for long-term (current) use of medications 02/04/2012    Fall as cause of accidental injury at home as place of occurrence 09/08/2018   GERD (gastroesophageal reflux disease) 11/13/2015   Hallucination, visual 09/14/2017   Head injury 09/08/2018   Hyperlipidemia    Hyperparathyroidism ([place])    Hyperparathyroidism, secondary renal ([place]) 07/31/2014   [facility] [name], MD  Plan:  25 Hydroxy Vit D (59.1 ng/ml)    Hypothyroidism    Moderate episode of recurrent major depressive disorder ([place]) 08/31/2016   PONV (postoperative nausea and vomiting)    Recurrent UTI 06/25/2016   Seizures ([place])    3 years ago,2018   Sepsis ([place]) 04/29/2020   Severe recurrent depression with psychosis ([place]) 06/25/2016   Sleep apnea    cpap   Syncope 08/30/2015   Syncope and collapse 02/12/2012   Tachycardia 02/04/2012   Trochanteric bursitis of both hips 2012   Confirmed on MRI   URI (upper respiratory infection) 09/27/2018    Past Surgical History:  Procedure Laterality Date   ABDOMINAL HYSTERECTOMY     APPENDECTOMY  1986   BACK SURGERY     L4-5 fusion   CATARACT EXTRACTION     KNEE SURGERY     REVERSE SHOULDER ARTHROPLASTY Left 07/03/2020   Procedure: REVERSE SHOULDER ARTHROPLASTY;  Surgeon: [name], MD;  Location[facility];  Service: Orthopedics;  Laterality: Left;   SHOULDER ARTHROSCOPY WITH SUBACROMIAL DECOMPRESSION Left 10/20/2018   Procedure: SHOULDER ARTHROSCOPY, ROTATOR CUFF DEBRIDEMENT, GLENOHUMERAL JOINT DEBRIDEMENT, ACROMIOPLASTY, DISTAL CLAVICLE RESECTION;  Surgeon: [name], MD;  Location[facility];  Service: Orthopedics;  Laterality: Left;    MEDICATIONS:  albuterol (VENTOLIN HFA) 108 (90 Base) MCG/ACT inhaler   aspirin EC 81 MG tablet   blood glucose meter kit and supplies KIT   BOOSTRIX 5-2.5-18.5 LF-MCG/0.5 injection   buPROPion (WELLBUTRIN XL) 150 MG 24 hr tablet   busPIRone (BUSPAR) 7.5 MG tablet   cetirizine (ZYRTEC) 10 MG tablet   diclofenac sodium (VOLTAREN) 1 % GEL   DULoxetine (CYMBALTA) 60  MG capsule   famotidine (PEPCID) 20 MG tablet   fluticasone (FLONASE) 50 MCG/ACT nasal spray   gabapentin (NEURONTIN) 600 MG tablet   hydrOXYzine (ATARAX/VISTARIL) 50 MG tablet   levothyroxine (SYNTHROID) 50 MCG tablet   levothyroxine (SYNTHROID) 75 MCG tablet   meclizine (ANTIVERT) 50 MG tablet   metFORMIN (GLUCOPHAGE) 500 MG tablet   Multiple Vitamins-Minerals (MULTIVITAMIN WITH MINERALS) tablet   Polyvinyl Alcohol-Povidone PF (REFRESH) 1.4-0.6 % SOLN   prazosin (MINIPRESS) 5 MG capsule   Probiotic Product (ALIGN) 4 MG CAPS   promethazine (PHENERGAN) 25 MG suppository   QUEtiapine (SEROQUEL) 300 MG tablet   rizatriptan (MAXALT-MLT) 10 MG disintegrating tablet   zonisamide (ZONEGRAN) 100 MG capsule   No current facility-administered medications for this encounter.

## 2021-02-04 ENCOUNTER — Encounter (HOSPITAL_BASED_OUTPATIENT_CLINIC_OR_DEPARTMENT_OTHER): Payer: Self-pay | Admitting: Nurse Practitioner

## 2021-02-04 ENCOUNTER — Other Ambulatory Visit (HOSPITAL_BASED_OUTPATIENT_CLINIC_OR_DEPARTMENT_OTHER): Payer: Self-pay | Admitting: Nurse Practitioner

## 2021-02-04 ENCOUNTER — Telehealth (HOSPITAL_BASED_OUTPATIENT_CLINIC_OR_DEPARTMENT_OTHER): Payer: Self-pay | Admitting: Nurse Practitioner

## 2021-02-04 NOTE — Telephone Encounter (Signed)
Spoke with Darel Hong with Dr. Selina Cooley office about lab results and concerns. Discussed that at this time we are unable to sign off on surgery until we have further information and recommendations from Dr. Arrie Aran with [facility].  Labs results and information provided to Dr. Arrie Aran and he is planning to see the patient for further work-up and recommendations.   Will await word from Dr. Arrie Aran before any final decisions are made for surgical clearance. Darel Hong reports that Dr. Ave Filter is aware.

## 2021-02-04 NOTE — Telephone Encounter (Signed)
Contacted Dr. Arrie Aran with [facility] regarding hyponatremia and decreased GFR discovered with pre-op labs for shoulder arthroplasty.  Discussed concerns of recent decline in GFR and hyponatremia in patient. Lab results faxed to [facility] for review.  He recommends seeing the patient in his office before further determination can be made about proceeding with surgery. Will await further information from his office and notify Dr. Selina Cooley office of this recommendation.

## 2021-02-04 NOTE — Telephone Encounter (Signed)
Judy with Dr. Veda Canning offic at [facility] called stated she left a message on providers CMA line on Monday 9/19 regarding pts sodium levels being low for her up coming surgery on 9/29. She needs a call back regarding this matter sometime today. Call back number [phone]

## 2021-02-06 ENCOUNTER — Encounter (HOSPITAL_BASED_OUTPATIENT_CLINIC_OR_DEPARTMENT_OTHER): Payer: Self-pay | Admitting: Nurse Practitioner

## 2021-02-08 ENCOUNTER — Encounter (HOSPITAL_BASED_OUTPATIENT_CLINIC_OR_DEPARTMENT_OTHER): Payer: Self-pay | Admitting: Nurse Practitioner

## 2021-02-09 DIAGNOSIS — I129 Hypertensive chronic kidney disease with stage 1 through stage 4 chronic kidney disease, or unspecified chronic kidney disease: Secondary | ICD-10-CM | POA: Diagnosis not present

## 2021-02-09 DIAGNOSIS — E785 Hyperlipidemia, unspecified: Secondary | ICD-10-CM | POA: Diagnosis not present

## 2021-02-09 DIAGNOSIS — I5030 Unspecified diastolic (congestive) heart failure: Secondary | ICD-10-CM | POA: Diagnosis not present

## 2021-02-09 DIAGNOSIS — G709 Myoneural disorder, unspecified: Secondary | ICD-10-CM | POA: Diagnosis not present

## 2021-02-09 DIAGNOSIS — E871 Hypo-osmolality and hyponatremia: Secondary | ICD-10-CM | POA: Diagnosis not present

## 2021-02-09 DIAGNOSIS — G4733 Obstructive sleep apnea (adult) (pediatric): Secondary | ICD-10-CM | POA: Diagnosis not present

## 2021-02-09 DIAGNOSIS — N390 Urinary tract infection, site not specified: Secondary | ICD-10-CM | POA: Diagnosis not present

## 2021-02-09 DIAGNOSIS — Z9989 Dependence on other enabling machines and devices: Secondary | ICD-10-CM | POA: Diagnosis not present

## 2021-02-09 DIAGNOSIS — N1831 Chronic kidney disease, stage 3a: Secondary | ICD-10-CM | POA: Diagnosis not present

## 2021-02-09 DIAGNOSIS — E039 Hypothyroidism, unspecified: Secondary | ICD-10-CM | POA: Diagnosis not present

## 2021-02-12 ENCOUNTER — Ambulatory Visit (HOSPITAL_COMMUNITY): Admission: RE | Admit: 2021-02-12 | Payer: Medicare Other | Source: Ambulatory Visit | Admitting: Orthopedic Surgery

## 2021-02-12 ENCOUNTER — Encounter (HOSPITAL_COMMUNITY): Admission: RE | Payer: Self-pay | Source: Ambulatory Visit

## 2021-02-12 SURGERY — ARTHROPLASTY, SHOULDER, TOTAL, REVERSE
Anesthesia: Choice | Site: Shoulder | Laterality: Right

## 2021-02-17 DIAGNOSIS — N1831 Chronic kidney disease, stage 3a: Secondary | ICD-10-CM | POA: Diagnosis not present

## 2021-02-20 ENCOUNTER — Encounter (HOSPITAL_BASED_OUTPATIENT_CLINIC_OR_DEPARTMENT_OTHER): Payer: Self-pay | Admitting: Nurse Practitioner

## 2021-02-20 ENCOUNTER — Other Ambulatory Visit: Payer: Self-pay

## 2021-02-20 ENCOUNTER — Ambulatory Visit (INDEPENDENT_AMBULATORY_CARE_PROVIDER_SITE_OTHER): Payer: Medicare Other | Admitting: Nurse Practitioner

## 2021-02-20 VITALS — BP 109/67 | HR 112 | Ht 61.0 in | Wt 179.4 lb

## 2021-02-20 DIAGNOSIS — F39 Unspecified mood [affective] disorder: Secondary | ICD-10-CM | POA: Diagnosis not present

## 2021-02-20 DIAGNOSIS — F4310 Post-traumatic stress disorder, unspecified: Secondary | ICD-10-CM

## 2021-02-20 DIAGNOSIS — F411 Generalized anxiety disorder: Secondary | ICD-10-CM | POA: Diagnosis not present

## 2021-02-20 DIAGNOSIS — F515 Nightmare disorder: Secondary | ICD-10-CM | POA: Diagnosis not present

## 2021-02-20 DIAGNOSIS — F339 Major depressive disorder, recurrent, unspecified: Secondary | ICD-10-CM

## 2021-02-20 DIAGNOSIS — M75101 Unspecified rotator cuff tear or rupture of right shoulder, not specified as traumatic: Secondary | ICD-10-CM

## 2021-02-20 DIAGNOSIS — M12811 Other specific arthropathies, not elsewhere classified, right shoulder: Secondary | ICD-10-CM | POA: Insufficient documentation

## 2021-02-20 HISTORY — DX: Other specific arthropathies, not elsewhere classified, right shoulder: M12.811

## 2021-02-20 HISTORY — DX: Unspecified rotator cuff tear or rupture of right shoulder, not specified as traumatic: M75.101

## 2021-02-20 MED ORDER — OXYCODONE-ACETAMINOPHEN 5-325 MG PO TABS: 1.0000 | ORAL_TABLET | Freq: Four times a day (QID) | ORAL | 0 refills | Status: DC | PRN

## 2021-02-20 MED ORDER — BUSPIRONE HCL 7.5 MG PO TABS
15.0000 mg | ORAL_TABLET | Freq: Three times a day (TID) | ORAL | 1 refills | Status: DC
Start: 2021-02-20 — End: 2021-10-05

## 2021-02-20 NOTE — Assessment & Plan Note (Signed)
Worsening depression symptoms in the setting of pain and worsening kidney function Treatment adjustment difficult due to kidney function at this time Max kidney safe dose on mood medications with exception of buspirone Will increase dose to 15mg  TID to see if this is helpful for symptoms.

## 2021-02-20 NOTE — Assessment & Plan Note (Signed)
Significant pain related to current tear Scheduled surgery postponed last month due to worsening kidney function and hyponatremia. She is very frustrated with this and it has worsened her depression and anxiety symptoms Discussed options of opiate therapy for short term pain control while she awaits surgery. She is in agreement with this.  Medication review reveals that she is able to tolerate percocet.  She will start with one tab at bedtime to see if this helps with sleep and mood.  Cautioned against taking multiple times a day due to the decreased renal clearance of the medication. She is aware to take this on very limited basis.  Will follow-up with her next week to see how she is feeling.

## 2021-02-20 NOTE — Assessment & Plan Note (Signed)
Evidence of worsening depression and anxiety symptoms since recent health issues R shoulder surgery for rotator cuff repair has been delayed due to worsening kidney function and hypokalemia She is working with nephrology and her orthopedic surgeon to get this on track in hopes that surgery can be rescheduled soon. She is in a tremendous amount of pain in the right shoulder which is affecting her sleep and likely contributing to worsening mood.  Review of medications shows she is at maximum kidney safe dose of all medications with exception of buspar.  Will plan to increase buspar to 15mg  TID for worsening symptoms.  Once surgery has been completed, we will address whether this dose can be decreased.

## 2021-02-20 NOTE — Assessment & Plan Note (Signed)
See anxiety and depression notes

## 2021-02-20 NOTE — Progress Notes (Signed)
Established Patient Office Visit  Subjective:  Patient ID: Jennifer Davidson, female    DOB: 1955/10/09  Age: 65 y.o. MRN: [id number]  CC:  Chief Complaint  Patient presents with   Follow-up    HPI Jennifer Davidson presents for follow-up for chronic conditions.  Jennifer Davidson reports significantly worse anxiety and depression with recent postponement of surgery and kidney issues. Jennifer Davidson is not sleeping well at night.  Jennifer Davidson is in a lot of pain and unable to take anything aside from tylenol. Jennifer Davidson has been using massage and cream with lidocaine with no effect on pain. Jennifer Davidson reports Jennifer Davidson appetite is decreased, but this has been ongoing prior to the kidney issues.  Jennifer Davidson tells me Jennifer Davidson migraines have increased and come on very strong. Jennifer Davidson feels these are due to worry. The maxalt does help when Jennifer Davidson takes it. Jennifer Davidson neurology gave Jennifer Davidson samples of ubrevly in the event the maxalt is ineffective and that works when [employer] does not.    Outpatient Medications Prior to Visit  Medication Sig Dispense Refill   albuterol (VENTOLIN HFA) 108 (90 Base) MCG/ACT inhaler TAKE 2 PUFFS BY MOUTH EVERY 6 HOURS AS NEEDED FOR WHEEZE OR SHORTNESS OF BREATH 18 g 6   aspirin EC 81 MG tablet Take 1 tablet (81 mg total) by mouth daily. 90 tablet 3   blood glucose meter kit and supplies KIT Dispense based on patient and insurance preference. Use up to four times daily as directed. Check blood sugar three times weekly, then as needed. 1 each 0   buPROPion (WELLBUTRIN XL) 150 MG 24 hr tablet Take 1 tablet (150 mg total) by mouth daily. 90 tablet 3   cetirizine (ZYRTEC) 10 MG tablet Take 1 tablet (10 mg total) by mouth daily. (Patient taking differently: Take 10 mg by mouth at bedtime.) 90 tablet 3   diclofenac sodium (VOLTAREN) 1 % GEL Apply 2 g topically daily as needed (for knee pain). 100 g 3   DULoxetine (CYMBALTA) 60 MG capsule Take 1 capsule (60 mg total) by mouth daily. 90 capsule 3   famotidine (PEPCID) 20 MG tablet Take 1 tablet (20 mg  total) by mouth daily as needed for heartburn or indigestion. 90 tablet 3   fluticasone (FLONASE) 50 MCG/ACT nasal spray PLACE 2 SPRAYS AT BEDTIME INTO BOTH NOSTRILS. (Patient taking differently: Place 2 sprays into both nostrils daily as needed for allergies.) 16 g 5   gabapentin (NEURONTIN) 600 MG tablet Take 1 tablet (600 mg total) by mouth 3 (three) times daily. (Patient taking differently: Take 600 mg by mouth 2 (two) times daily.) 270 tablet 3   hydrOXYzine (ATARAX/VISTARIL) 50 MG tablet Take 1 tablet (50 mg total) by mouth every 6 (six) hours as needed. 360 tablet 2   levothyroxine (SYNTHROID) 50 MCG tablet Take 1 tablet (50 mcg total) by mouth every other day. Before breakfast. Alternate with 46mg tablet 90 tablet 1   levothyroxine (SYNTHROID) 75 MCG tablet Take 1 tablet (75 mcg total) by mouth every other day. Before breakfast. Alternate with 548m tablet 90 tablet 1   meclizine (ANTIVERT) 50 MG tablet Take 0.5 tablets (25 mg total) by mouth 2 (two) times daily as needed for dizziness. (Patient taking differently: Take 50 mg by mouth 2 (two) times daily as needed for dizziness.) 60 tablet 3   metFORMIN (GLUCOPHAGE) 500 MG tablet Take 1 tablet (500 mg total) by mouth 2 (two) times daily with a meal. 180 tablet 3   Multiple Vitamins-Minerals (MULTIVITAMIN  WITH MINERALS) tablet Take 1 tablet by mouth daily.     Polyvinyl Alcohol-Povidone PF (REFRESH) 1.4-0.6 % SOLN Place 1 drop into both eyes daily as needed (dry eyes).     prazosin (MINIPRESS) 5 MG capsule Take 2-3 capsules (10-15 mg total) by mouth at bedtime. TAKE 2 TO 3 CAPSULES AT BEDTIME 270 capsule 0   Probiotic Product (ALIGN) 4 MG CAPS Take 1 capsule (4 mg total) by mouth daily. 30 capsule 0   promethazine (PHENERGAN) 25 MG suppository Place 1 suppository (25 mg total) rectally every 6 (six) hours as needed for nausea. 12 each 0   QUEtiapine (SEROQUEL) 300 MG tablet Take 1 tablet (300 mg total) by mouth at bedtime. TAKE 1 TABLET BY MOUTH  EVERYDAY AT BEDTIME 90 tablet 3   rizatriptan (MAXALT-MLT) 10 MG disintegrating tablet Take 1 tablet (10 mg total) by mouth as needed for migraine. May repeat in 2 hours if needed 36 tablet 3   zonisamide (ZONEGRAN) 100 MG capsule Take 1 capsule (100 mg total) by mouth daily. 30 capsule 5   busPIRone (BUSPAR) 7.5 MG tablet Take 1 tablet (7.5 mg total) by mouth 3 (three) times daily. 270 tablet 3   No facility-administered medications prior to visit.    Allergies  Allergen Reactions   Tramadol Nausea And Vomiting   Trazodone And Nefazodone Other (See Comments)    Per pt trazodone caused hallucinations and behavior changes     ROS Review of Systems All review of systems negative except what is listed in the HPI    Objective:    Physical Exam Vitals and nursing note reviewed.  Constitutional:      Appearance: Normal appearance. Jennifer Davidson is ill-appearing.  HENT:     Head: Normocephalic.  Eyes:     Extraocular Movements: Extraocular movements intact.     Conjunctiva/sclera: Conjunctivae normal.     Pupils: Pupils are equal, round, and reactive to light.  Neck:     Vascular: No carotid bruit.  Cardiovascular:     Rate and Rhythm: Normal rate and regular rhythm.     Pulses: Normal pulses.     Heart sounds: Normal heart sounds. No murmur heard. Pulmonary:     Effort: Pulmonary effort is normal.     Breath sounds: Normal breath sounds.  Abdominal:     General: Abdomen is flat. Bowel sounds are normal. There is no distension.     Palpations: Abdomen is soft. There is no mass.     Tenderness: There is no abdominal tenderness. There is no right CVA tenderness, left CVA tenderness, guarding or rebound.  Musculoskeletal:        General: Normal range of motion.     Cervical back: Normal range of motion.     Right lower leg: No edema.     Left lower leg: No edema.  Skin:    General: Skin is warm and dry.     Capillary Refill: Capillary refill takes less than 2 seconds.  Neurological:      General: No focal deficit present.     Mental Status: Jennifer Davidson is alert and oriented to person, place, and time.  Psychiatric:        Mood and Affect: Mood is depressed.        Behavior: Behavior normal.        Thought Content: Thought content normal.        Judgment: Judgment normal.    BP 109/67   Pulse (!) 112   Ht 5'  1" (1.549 m)   Wt 179 lb 6.4 oz (81.4 kg)   SpO2 100%   BMI 33.90 kg/m  Wt Readings from Last 3 Encounters:  02/20/21 179 lb 6.4 oz (81.4 kg)  02/02/21 175 lb (79.4 kg)  01/26/21 178 lb 9.6 oz (81 kg)     Lab Results  Component Value Date   TSH 1.800 11/04/2020   Lab Results  Component Value Date   WBC 9.7 02/02/2021   HGB 13.0 02/02/2021   HCT 39.8 02/02/2021   MCV 81.9 02/02/2021   PLT 305 02/02/2021   Lab Results  Component Value Date   NA 130 (L) 02/02/2021   K 4.6 02/02/2021   CO2 25 02/02/2021   GLUCOSE 97 02/02/2021   BUN 20 02/02/2021   CREATININE 1.65 (H) 02/02/2021   BILITOT 0.4 01/13/2021   ALKPHOS 65 01/13/2021   AST 17 01/13/2021   ALT 14 01/13/2021   PROT 6.8 01/13/2021   ALBUMIN 3.9 01/13/2021   CALCIUM 9.9 02/02/2021   ANIONGAP 10 02/02/2021   EGFR 42 (L) 11/04/2020   Lab Results  Component Value Date   CHOL 171 07/24/2020   Lab Results  Component Value Date   HDL 60 07/24/2020   Lab Results  Component Value Date   LDLCALC 80 07/24/2020   Lab Results  Component Value Date   TRIG 153 (H) 07/24/2020   Lab Results  Component Value Date   CHOLHDL 2.9 07/24/2020   Lab Results  Component Value Date   HGBA1C 5.5 02/02/2021      Assessment & Plan:   Problem List Items Addressed This Visit     Major depression, recurrent, chronic (HCC) - Primary (Chronic)    Worsening depression symptoms in the setting of pain and worsening kidney function Treatment adjustment difficult due to kidney function at this time Max kidney safe dose on mood medications with exception of buspirone Will increase dose to 55m TID to  see if this is helpful for symptoms.        Relevant Medications   busPIRone (BUSPAR) 7.5 MG tablet   Mood disorder (HCC)    See anxiety and depression notes      Relevant Medications   busPIRone (BUSPAR) 7.5 MG tablet   Generalized anxiety disorder    Evidence of worsening depression and anxiety symptoms since recent health issues R shoulder surgery for rotator cuff repair has been delayed due to worsening kidney function and hypokalemia Jennifer Davidson is working with nephrology and Jennifer Davidson orthopedic surgeon to get this on track in hopes that surgery can be rescheduled soon. Jennifer Davidson is in a tremendous amount of pain in the right shoulder which is affecting Jennifer Davidson sleep and likely contributing to worsening mood.  Review of medications shows Jennifer Davidson is at maximum kidney safe dose of all medications with exception of buspar.  Will plan to increase buspar to 129mTID for worsening symptoms.  Once surgery has been completed, we will address whether this dose can be decreased.       Relevant Medications   busPIRone (BUSPAR) 7.5 MG tablet   PTSD (post-traumatic stress disorder)    See anxiety and depression notes      Relevant Medications   busPIRone (BUSPAR) 7.5 MG tablet   Rotator cuff tear arthropathy of right shoulder    Significant pain related to current tear Scheduled surgery postponed last month due to worsening kidney function and hyponatremia. Jennifer Davidson is very frustrated with this and it has worsened Jennifer Davidson depression and anxiety symptoms  Discussed options of opiate therapy for short term pain control while Jennifer Davidson awaits surgery. Jennifer Davidson is in agreement with this.  Medication review reveals that Jennifer Davidson is able to tolerate percocet.  Jennifer Davidson will start with one tab at bedtime to see if this helps with sleep and mood.  Cautioned against taking multiple times a day due to the decreased renal clearance of the medication. Jennifer Davidson is aware to take this on very limited basis.  Will follow-up with Jennifer Davidson next week to see how Jennifer Davidson is  feeling.       Relevant Medications   oxyCODONE-acetaminophen (PERCOCET) 5-325 MG tablet   Dream anxiety disorder   Relevant Medications   busPIRone (BUSPAR) 7.5 MG tablet    Meds ordered this encounter  Medications   busPIRone (BUSPAR) 7.5 MG tablet    Sig: Take 2 tablets (15 mg total) by mouth 3 (three) times daily.    Dispense:  540 tablet    Refill:  1   oxyCODONE-acetaminophen (PERCOCET) 5-325 MG tablet    Sig: Take 1-2 tablets by mouth every 6 (six) hours as needed for severe pain.    Dispense:  20 tablet    Refill:  0  PDMP reviewed today  Follow-up: Return in about 3 months (around 05/23/2021) for Depression, Thyroid, Pre-DM.  Time: 30 minutes, >50% spent counseling, care coordination, chart review, and documentation.    Orma Render, NP

## 2021-02-20 NOTE — Patient Instructions (Addendum)
Increase the Buspar to 2 tabs (15mg ) three times a day at least until we get through the surgery to see if we can help control your depression and anxiety symptoms a little better.   Send me a message to let me know if you are feeling any better with the increase in the buspar. Let me know in about a week.   Also keep me posted on your pain and the updates of your kidneys and surgery.   I have sent in percocet for your pain. Be sure to take as little as possible because this does take longer to clear from your system with your kidney function being lower right now.   If you think of the name of the kidney medication please let me know

## 2021-02-20 NOTE — Assessment & Plan Note (Addendum)
>>  ASSESSMENT AND PLAN FOR MOOD DISORDER (HCC) WRITTEN ON 09/21/2023  5:42 PM BY Takisha Pelle E, NP   >>ASSESSMENT AND PLAN FOR MOOD DISORDER (HCC) WRITTEN ON 02/20/2021 10:58 AM BY Jajuan Skoog E, NP  See anxiety and depression notes   >>ASSESSMENT AND PLAN FOR MAJOR DEPRESSION, RECURRENT, CHRONIC (HCC) WRITTEN ON 02/20/2021 10:58 AM BY Denine Brotz E, NP  Worsening depression symptoms in the setting of pain and worsening kidney function Treatment adjustment difficult due to kidney function at this time Max kidney safe dose on mood medications with exception of buspirone  Will increase dose to 15mg  TID to see if this is helpful for symptoms.     >>ASSESSMENT AND PLAN FOR GENERALIZED ANXIETY DISORDER WRITTEN ON 02/20/2021 10:57 AM BY Brexley Cutshaw E, NP  Evidence of worsening depression and anxiety symptoms since recent health issues R shoulder surgery for rotator cuff repair has been delayed due to worsening kidney function and hypokalemia She is working with nephrology and her orthopedic surgeon to get this on track in hopes that surgery can be rescheduled soon. She is in a tremendous amount of pain in the right shoulder which is affecting her sleep and likely contributing to worsening mood.  Review of medications shows she is at maximum kidney safe dose of all medications with exception of buspar .  Will plan to increase buspar  to 15mg  TID for worsening symptoms.  Once surgery has been completed, we will address whether this dose can be decreased.

## 2021-02-23 ENCOUNTER — Other Ambulatory Visit (HOSPITAL_BASED_OUTPATIENT_CLINIC_OR_DEPARTMENT_OTHER): Payer: Self-pay | Admitting: Nurse Practitioner

## 2021-02-23 DIAGNOSIS — M12811 Other specific arthropathies, not elsewhere classified, right shoulder: Secondary | ICD-10-CM

## 2021-02-23 MED ORDER — OXYCODONE-ACETAMINOPHEN 5-325 MG PO TABS: 1.0000 | ORAL_TABLET | Freq: Four times a day (QID) | ORAL | 0 refills | Status: DC | PRN

## 2021-02-26 ENCOUNTER — Other Ambulatory Visit: Payer: Self-pay | Admitting: Orthopedic Surgery

## 2021-02-26 ENCOUNTER — Other Ambulatory Visit (HOSPITAL_BASED_OUTPATIENT_CLINIC_OR_DEPARTMENT_OTHER): Payer: Self-pay | Admitting: Nurse Practitioner

## 2021-02-26 DIAGNOSIS — M12811 Other specific arthropathies, not elsewhere classified, right shoulder: Secondary | ICD-10-CM

## 2021-02-26 DIAGNOSIS — Z01811 Encounter for preprocedural respiratory examination: Secondary | ICD-10-CM

## 2021-02-26 DIAGNOSIS — M75101 Unspecified rotator cuff tear or rupture of right shoulder, not specified as traumatic: Secondary | ICD-10-CM

## 2021-02-26 MED ORDER — OXYCODONE-ACETAMINOPHEN 5-325 MG PO TABS: 1.0000 | ORAL_TABLET | Freq: Four times a day (QID) | ORAL | 0 refills | Status: DC | PRN

## 2021-02-27 NOTE — Patient Instructions (Addendum)
DUE TO COVID-19 ONLY ONE VISITOR IS ALLOWED TO COME WITH YOU AND STAY IN THE WAITING ROOM ONLY DURING PRE OP AND PROCEDURE.   **NO VISITORS ARE ALLOWED IN THE SHORT STAY AREA OR RECOVERY ROOM!!**        Your procedure is scheduled on: Thursday, 03-05-21   Report to [facility] Main  Entrance    Report to admitting at 9:00 AM   Call this number if you have problems the morning of surgery [phone]   Do not eat food :After Midnight.   May have liquids until 8:45 AM day of surgery  CLEAR LIQUID DIET  Foods Allowed                                                                     Foods Excluded  Water, Black Coffee (no milk/no creamer) and tea, regular and decaf                              liquids that you cannot  Plain Jell-O in any flavor  (No red)                         see through such as: Fruit ices (not with fruit pulp)                                 milk, soups, orange juice  Iced Popsicles (No red)                                    All solid food                             Apple juices Sports drinks like Gatorade (No red) Lightly seasoned clear broth or consume(fat free) Sugar     Complete one G2 drink the morning of surgery at 8:45 AM the day of surgery.      The day of surgery:  Drink ONE (1) Pre-Surgery G2 the morning of surgery. Drink in one sitting. Do not sip.  This drink was given to you during your hospital  pre-op appointment visit. Nothing else to drink after completing the Pre-Surgery Clear G2.          If you have questions, please contact your surgeon's office.     Oral Hygiene is also important to reduce your risk of infection.                                    Remember - BRUSH YOUR TEETH THE MORNING OF SURGERY WITH YOUR REGULAR TOOTHPASTE   Do NOT smoke after Midnight  Take these medicines the morning of surgery with A SIP OF WATER:  Buproprion, Buspar, Cetirizine, Duloxetine, Zonegran, Levothyroxine, Famotidine, Gabapentin,  Hydroxyzine   How to Manage Your Diabetes Before and After Surgery  Why is it important to control my blood sugar before and after surgery? Improving blood  sugar levels before and after surgery helps healing and can limit problems. A way of improving blood sugar control is eating a healthy diet by:  Eating less sugar and carbohydrates  Increasing activity/exercise  Talking with your doctor about reaching your blood sugar goals High blood sugars (greater than 180 mg/dL) can raise your risk of infections and slow your recovery, so you will need to focus on controlling your diabetes during the weeks before surgery. Make sure that the doctor who takes care of your diabetes knows about your planned surgery including the date and location.  How do I manage my blood sugar before surgery? Check your blood sugar at least 4 times a day, starting 2 days before surgery, to make sure that the level is not too high or low. Check your blood sugar the morning of your surgery when you wake up and every 2 hours until you get to the Short Stay unit. If your blood sugar is less than 70 mg/dL, you will need to treat for low blood sugar: Do not take insulin. Treat a low blood sugar (less than 70 mg/dL) with  cup of clear juice (cranberry or apple), 4 glucose tablets, OR glucose gel. Recheck blood sugar in 15 minutes after treatment (to make sure it is greater than 70 mg/dL). If your blood sugar is not greater than 70 mg/dL on recheck, call [phone] for further instructions. Report your blood sugar to the short stay nurse when you get to Short Stay.  If you are admitted to the hospital after surgery: Your blood sugar will be checked by the staff and you will probably be given insulin after surgery (instead of oral diabetes medicines) to make sure you have good blood sugar levels. The goal for blood sugar control after surgery is 80-180 mg/dL.   WHAT DO I DO ABOUT MY DIABETES MEDICATION?  Do not take oral  diabetes medicines (pills) the morning of surgery.  THE DAY BEFORE SURGERY: Take Metformin as prescribed.       THE MORNING OF SURGERY: Do not take Metformin.  Reviewed and Endorsed by [facility] Patient Education Committee, August 2015    Aspirin - stop one week prior to surgery   Stop all vitamins and herbal supplements a week before surgery   Bring CPAP mask and tubing day of surgery             You may not have any metal on your body including hair pins, jewelry, and body piercing             Do not wear make-up, lotions, powders, perfumes or deodorant  Do not wear nail polish including gel and S&S, artificial/acrylic nails, or any other type of covering on natural nails including finger and toenails. If you have artificial nails, gel coating, etc. that needs to be removed by a nail salon please have this removed prior to surgery or surgery may need to be canceled/ delayed if the surgeon/ anesthesia feels like they are unable to be safely monitored.   Do not shave  48 hours prior to surgery.   Do not bring valuables to the hospital. [facility] IS NOT RESPONSIBLE FOR VALUABLES.   Contacts, dentures or bridgework may not be worn into surgery.    Patients discharged the day of surgery will not be allowed to drive home.   Please read over the following fact sheets you were given: IF YOU HAVE QUESTIONS ABOUT YOUR PRE OP INSTRUCTIONS PLEASE CALL [phone] G[facility]  [place]- Preparing for Total Shoulder Arthroplasty    Before surgery, you can play an important role. Because skin is not sterile, your skin needs to be as free of germs as possible. You can reduce the number of germs on your skin by using the following products. Benzoyl Peroxide Gel Reduces the number of germs present on the skin Applied twice a day to shoulder area starting two days before surgery    ==================================================================  Please follow these instructions  carefully:  BENZOYL PEROXIDE 5% GEL  Please do not use if you have an allergy to benzoyl peroxide.   If your skin becomes reddened/irritated stop using the benzoyl peroxide.  Starting two days before surgery, apply as follows: Apply benzoyl peroxide in the morning and at night. Apply after taking a shower. If you are not taking a shower clean entire shoulder front, back, and side along with the armpit with a clean wet washcloth.  Place a quarter-sized dollop on your shoulder and rub in thoroughly, making sure to cover the front, back, and side of your shoulder, along with the armpit.   2 days before ____ AM   ____ PM              1 day before ____ AM   ____ PM                         Do this twice a day for two days.  (Last application is the night before surgery, AFTER using the CHG soap as described below).  Do NOT apply benzoyl peroxide gel on the day of surgery.    [place] - Preparing for Surgery Before surgery, you can play an important role.  Because skin is not sterile, your skin needs to be as free of germs as possible.  You can reduce the number of germs on your skin by washing with CHG (chlorahexidine gluconate) soap before surgery.  CHG is an antiseptic cleaner which kills germs and bonds with the skin to continue killing germs even after washing. Please DO NOT use if you have an allergy to CHG or antibacterial soaps.  If your skin becomes reddened/irritated stop using the CHG and inform your nurse when you arrive at Short Stay. Do not shave (including legs and underarms) for at least 48 hours prior to the first CHG shower.  You may shave your face/neck.  Please follow these instructions carefully:  1.  Shower with CHG Soap the night before surgery and the  morning of surgery.  2.  If you choose to wash your hair, wash your hair first as usual with your normal  shampoo.  3.  After you shampoo, rinse your hair and body thoroughly to remove the shampoo.                              4.  Use CHG as you would any other liquid soap.  You can apply chg directly to the skin and wash.  Gently with a scrungie or clean washcloth.  5.  Apply the CHG Soap to your body ONLY FROM THE NECK DOWN.   Do   not use on face/ open                           Wound or open sores. Avoid contact with eyes, ears mouth and   genitals (private parts).  Wash face,  Genitals (private parts) with your normal soap.             6.  Wash thoroughly, paying special attention to the area where your    surgery  will be performed.  7.  Thoroughly rinse your body with warm water from the neck down.  8.  DO NOT shower/wash with your normal soap after using and rinsing off the CHG Soap.                9.  Pat yourself dry with a clean towel.            10.  Wear clean pajamas.            11.  Place clean sheets on your bed the night of your first shower and do not  sleep with pets. Day of Surgery : Do not apply any lotions/deodorants the morning of surgery.  Please wear clean clothes to the hospital/surgery center.  FAILURE TO FOLLOW THESE INSTRUCTIONS MAY RESULT IN THE CANCELLATION OF YOUR SURGERY  PATIENT SIGNATURE_________________________________  NURSE SIGNATURE__________________________________  ________________________________________________________________________   Rogelia Mire  An incentive spirometer is a tool that can help keep your lungs clear and active. This tool measures how well you are filling your lungs with each breath. Taking long deep breaths may help reverse or decrease the chance of developing breathing (pulmonary) problems (especially infection) following: A long period of time when you are unable to move or be active. BEFORE THE PROCEDURE  If the spirometer includes an indicator to show your best effort, your nurse or respiratory therapist will set it to a desired goal. If possible, sit up straight or lean slightly forward. Try not to slouch. Hold  the incentive spirometer in an upright position. INSTRUCTIONS FOR USE  Sit on the edge of your bed if possible, or sit up as far as you can in bed or on a chair. Hold the incentive spirometer in an upright position. Breathe out normally. Place the mouthpiece in your mouth and seal your lips tightly around it. Breathe in slowly and as deeply as possible, raising the piston or the ball toward the top of the column. Hold your breath for 3-5 seconds or for as long as possible. Allow the piston or ball to fall to the bottom of the column. Remove the mouthpiece from your mouth and breathe out normally. Rest for a few seconds and repeat Steps 1 through 7 at least 10 times every 1-2 hours when you are awake. Take your time and take a few normal breaths between deep breaths. The spirometer may include an indicator to show your best effort. Use the indicator as a goal to work toward during each repetition. After each set of 10 deep breaths, practice coughing to be sure your lungs are clear. If you have an incision (the cut made at the time of surgery), support your incision when coughing by placing a pillow or rolled up towels firmly against it. Once you are able to get out of bed, walk around indoors and cough well. You may stop using the incentive spirometer when instructed by your caregiver.  RISKS AND COMPLICATIONS Take your time so you do not get dizzy or light-headed. If you are in pain, you may need to take or ask for pain medication before doing incentive spirometry. It is harder to take a deep breath if you are having pain. AFTER USE Rest and breathe slowly and easily. It can be helpful to  keep track of a log of your progress. Your caregiver can provide you with a simple table to help with this. If you are using the spirometer at home, follow these instructions: SEEK MEDICAL CARE IF:  You are having difficultly using the spirometer. You have trouble using the spirometer as often as  instructed. Your pain medication is not giving enough relief while using the spirometer. You develop fever of 100.5 F (38.1 C) or higher. SEEK IMMEDIATE MEDICAL CARE IF:  You cough up bloody sputum that had not been present before. You develop fever of 102 F (38.9 C) or greater. You develop worsening pain at or near the incision site. MAKE SURE YOU:  Understand these instructions. Will watch your condition. Will get help right away if you are not doing well or get worse. Document Released: 09/13/2006 Document Revised: 07/26/2011 Document Reviewed: 11/14/2006 [facility] Patient Information 2014 [place], [place].   ________________________________________________________________________

## 2021-02-27 NOTE — Progress Notes (Addendum)
COVID swab appointment:N/A  COVID Vaccine Completed: Yes x3 Date COVID Vaccine completed: 08-22-19 09-19-19 Has received booster: Yes x1 05-06-20 COVID vaccine manufacturer:     Moderna     Date of COVID positive in last 90 days:  No  PCP - Enid Skeens, NP Cardiologist - Armanda Magic, MD  Cardiac Clearance in Epic dated 12-12-20 by Edd Fabian, NP-C  Chest x-ray - 03-03-21 Epic EKG - 05-20-20 Epic Stress Test - 06-09-20 Epic ECHO - 06-09-20 Epic Cardiac Cath -  Pacemaker/ICD device last checked: Spinal Cord Stimulator: Telemetry Monitor - 2021 Epic Coronary CT - 2021 Epic  Sleep Study -  Yes, +sleep apnea CPAP -  Yes  Fasting Blood Sugar -  90 to 120 Checks Blood Sugar - once every other day  Blood Thinner Instructions: Aspirin Instructions:  ASA 81 mg ok to hold 5-7 days  Last Dose:  02-26-21  Activity level:  Can go up a flight of stairs and perform activities of daily living without stopping and without symptoms of chest pain or shortness of breath.     Anesthesia review:  CHF, syncope, seizures.  Hx of chest pain and palpitations  Patient denies shortness of breath, fever, cough and chest pain at PAT appointment   Patient verbalized understanding of instructions that were given to them at the PAT appointment. Patient was also instructed that they will need to review over the PAT instructions again at home before surgery.

## 2021-03-03 ENCOUNTER — Other Ambulatory Visit: Payer: Self-pay

## 2021-03-03 ENCOUNTER — Encounter (HOSPITAL_COMMUNITY): Payer: Self-pay

## 2021-03-03 ENCOUNTER — Encounter (HOSPITAL_COMMUNITY)
Admission: RE | Admit: 2021-03-03 | Discharge: 2021-03-03 | Disposition: A | Discharge status: Home / Self Care | Payer: Medicare Other | Source: Ambulatory Visit | Attending: Orthopedic Surgery | Admitting: Orthopedic Surgery

## 2021-03-03 ENCOUNTER — Ambulatory Visit (HOSPITAL_COMMUNITY)
Admission: RE | Admit: 2021-03-03 | Discharge: 2021-03-03 | Disposition: A | Discharge status: Home / Self Care | Payer: Medicare Other | Source: Ambulatory Visit | Attending: Orthopedic Surgery | Admitting: Orthopedic Surgery

## 2021-03-03 DIAGNOSIS — E119 Type 2 diabetes mellitus without complications: Secondary | ICD-10-CM | POA: Insufficient documentation

## 2021-03-03 DIAGNOSIS — Z01811 Encounter for preprocedural respiratory examination: Secondary | ICD-10-CM

## 2021-03-03 DIAGNOSIS — Z01818 Encounter for other preprocedural examination: Secondary | ICD-10-CM | POA: Diagnosis not present

## 2021-03-03 DIAGNOSIS — M75121 Complete rotator cuff tear or rupture of right shoulder, not specified as traumatic: Secondary | ICD-10-CM | POA: Diagnosis not present

## 2021-03-03 LAB — GLUCOSE, CAPILLARY: Glucose-Capillary: 113 mg/dL — ABNORMAL HIGH (ref 70–99)

## 2021-03-03 LAB — CBC
HCT: 39 % (ref 36.0–46.0)
Hemoglobin: 12.1 g/dL (ref 12.0–15.0)
MCH: 26.1 pg (ref 26.0–34.0)
MCHC: 31 g/dL (ref 30.0–36.0)
MCV: 84.2 fL (ref 80.0–100.0)
Platelets: 240 10*3/uL (ref 150–400)
RBC: 4.63 MIL/uL (ref 3.87–5.11)
RDW: 16.1 % — ABNORMAL HIGH (ref 11.5–15.5)
WBC: 8.1 10*3/uL (ref 4.0–10.5)
nRBC: 0 % (ref 0.0–0.2)

## 2021-03-03 LAB — BASIC METABOLIC PANEL
Anion gap: 7 (ref 5–15)
BUN: 20 mg/dL (ref 8–23)
CO2: 22 mmol/L (ref 22–32)
Calcium: 8.7 mg/dL — ABNORMAL LOW (ref 8.9–10.3)
Chloride: 107 mmol/L (ref 98–111)
Creatinine, Ser: 1.25 mg/dL — ABNORMAL HIGH (ref 0.44–1.00)
GFR, Estimated: 47 mL/min — ABNORMAL LOW (ref >60)
Glucose, Bld: 104 mg/dL — ABNORMAL HIGH (ref 70–99)
Potassium: 3.9 mmol/L (ref 3.5–5.1)
Sodium: 136 mmol/L (ref 135–145)

## 2021-03-03 LAB — SURGICAL PCR SCREEN
MRSA, PCR: NEGATIVE
Staphylococcus aureus: NEGATIVE

## 2021-03-04 NOTE — Progress Notes (Signed)
Anesthesia Chart Review   Case: [id number] Date/Time: 03/05/21 1134   Procedure: REVERSE SHOULDER ARTHROPLASTY (Right: Shoulder)   Anesthesia type: Choice   Pre-op diagnosis: RIGHT SHOULDER ROTATOR CUFF TEAR ARTHROPATHY   Location: [facility] ROOM 07 / [facility]   Surgeons: [name], MD       DISCUSSION:65 y.o. never smoker with h/o PONV, GERD, CKD, sleep apnea with cpap, hyperparathyroidism, CHF, DM II, right shoulder rotator cuff tear scheduled for above procedure 03/05/2021 with Dr. [name].   Surgery previously cancelled due to renal function, creatinine 1.65. Seen by nephrology.  Creatinine now 1.25.   Per cardiology preoperative evaluation 12/12/2020. Per OV note, "Chart reviewed as part of pre-operative protocol coverage. Given past medical history and time since last visit, based on ACC/AHA guidelines, LOLLIE GUNNER would be at acceptable risk for the planned procedure without further cardiovascular testing.    Her aspirin may be held for 5-7 days prior to her surgery.  Please resume as soon as hemostasis is achieved."  Anticipate pt can proceed with planned procedure barring acute status change.   VS: BP 127/75   Pulse 100   Temp 36.8 C (Oral)   Resp 16   Ht 5' 2"  (1.575 m)   Wt 83 kg   SpO2 98%   BMI 33.47 kg/m   PROVIDERS: Early, Coralee Pesa, NP is PCP   Fransico Him, MD is Cardiologist  LABS: Labs reviewed: Acceptable for surgery. (all labs ordered are listed, but only abnormal results are displayed)  Labs Reviewed  CBC - Abnormal; Notable for the following components:      Result Value   RDW 16.1 (*)    All other components within normal limits  BASIC METABOLIC PANEL - Abnormal; Notable for the following components:   Glucose, Bld 104 (*)    Creatinine, Ser 1.25 (*)    Calcium 8.7 (*)    GFR, Estimated 47 (*)    All other components within normal limits  GLUCOSE, CAPILLARY - Abnormal; Notable for the following components:   Glucose-Capillary 113 (*)     All other components within normal limits  SURGICAL PCR SCREEN  TYPE AND SCREEN     IMAGES:   EKG: 05/20/2020 Rate 105 bpm    CV: Stress Test 06/09/2020 The left ventricular ejection fraction is hyperdynamic (>65%). Nuclear stress EF: 66%. There was no ST segment deviation noted during stress. No T wave inversion was noted during stress. The study is normal. This is a low risk study.   1. Normal study without ischemia or infarction.  2. Normal LVEF, >65%.  3. This is a low-risk study.  Echo 06/09/2020  1. Left ventricular ejection fraction, by estimation, is 60 to 65%. The  left ventricle has normal function. The left ventricle has no regional  wall motion abnormalities. There is mild concentric left ventricular  hypertrophy. Left ventricular diastolic  parameters are consistent with Grade I diastolic dysfunction (impaired  relaxation).   2. Right ventricular systolic function is normal. The right ventricular  size is normal. Tricuspid regurgitation signal is inadequate for assessing  PA pressure.   3. The mitral valve is normal in structure. Trivial mitral valve  regurgitation.   4. The aortic valve is tricuspid. Aortic valve regurgitation is trivial.   5. The inferior vena cava is normal in size with greater than 50%  respiratory variability, suggesting right atrial pressure of 3 mmHg Past Medical History:  Diagnosis Date   Acute cystitis without hematuria  Acute encephalopathy 06/25/2016   Altered mental status 06/23/2017   Anxiety    Anxiety and depression 02/04/2012   Arthritis    Cataract 08/31/2017   left eye   CHF (congestive heart failure) ([place]) 2017   resolved   Cholesterol serum increased    Chronic back pain    chronic Rt low back pain. s/p L4-5 fusion. failed Rt facet injections. poss due to Rt SI joint dysfunction.   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)    Diastolic heart failure ([place]) 03/17/2012   Edema 02/04/2012   Encounter for long-term (current) use of medications 02/04/2012   Fall as cause of accidental injury at home as place of occurrence 09/08/2018   GERD (gastroesophageal reflux disease) 11/13/2015   Hallucination, visual 09/14/2017   Head injury 09/08/2018   Hyperlipidemia    Hyperparathyroidism ([place])    Hyperparathyroidism, secondary renal ([place]) 07/31/2014   [facility] [name], MD  Plan:  25 Hydroxy Vit D (59.1 ng/ml)    Hypothyroidism    Moderate episode of recurrent major depressive disorder ([place]) 08/31/2016   PONV (postoperative nausea and vomiting)    Seizures ([place])    3 years ago,2018   Sepsis ([place]) 04/29/2020   Sleep apnea    cpap   Syncope 08/30/2015   Syncope and collapse 02/12/2012   Tachycardia 02/04/2012   Trochanteric bursitis of both hips 2012   Confirmed on MRI    Past Surgical History:  Procedure Laterality Date   ABDOMINAL HYSTERECTOMY     APPENDECTOMY  1986   BACK SURGERY     L4-5 fusion   CATARACT EXTRACTION     KNEE SURGERY     REVERSE SHOULDER ARTHROPLASTY Left 07/03/2020   Procedure: REVERSE SHOULDER ARTHROPLASTY;  Surgeon: [name], MD;  Location: [facility];  Service: Orthopedics;  Laterality: Left;   SHOULDER ARTHROSCOPY WITH SUBACROMIAL DECOMPRESSION Left 10/20/2018   Procedure: SHOULDER ARTHROSCOPY, ROTATOR CUFF DEBRIDEMENT, GLENOHUMERAL JOINT DEBRIDEMENT, ACROMIOPLASTY, DISTAL CLAVICLE RESECTION;  Surgeon: [name], MD;  Location: [facility];  Service: Orthopedics;  Laterality: Left;    MEDICATIONS:  albuterol (VENTOLIN HFA) 108 (90 Base) MCG/ACT inhaler   aspirin EC 81 MG tablet   blood glucose meter kit and supplies KIT   buPROPion (WELLBUTRIN XL) 150 MG 24 hr tablet   busPIRone (BUSPAR) 7.5 MG tablet   cetirizine  (ZYRTEC) 10 MG tablet   diclofenac sodium (VOLTAREN) 1 % GEL   DULoxetine (CYMBALTA) 60 MG capsule   famotidine (PEPCID) 20 MG tablet   fluticasone (FLONASE) 50 MCG/ACT nasal spray   gabapentin (NEURONTIN) 600 MG tablet   hydrOXYzine (ATARAX/VISTARIL) 50 MG tablet   levothyroxine (SYNTHROID) 50 MCG tablet   levothyroxine (SYNTHROID) 75 MCG tablet   meclizine (ANTIVERT) 50 MG tablet   metFORMIN (GLUCOPHAGE) 500 MG tablet   Multiple Vitamins-Minerals (MULTIVITAMIN WITH MINERALS) tablet   oxyCODONE-acetaminophen (PERCOCET) 5-325 MG tablet   Polyvinyl Alcohol-Povidone PF (REFRESH) 1.4-0.6 % SOLN   prazosin (MINIPRESS) 5 MG capsule   Probiotic Product (ALIGN) 4 MG CAPS   promethazine (PHENERGAN) 25 MG suppository   QUEtiapine (SEROQUEL) 300 MG tablet   rizatriptan (MAXALT-MLT) 10 MG disintegrating tablet   trolamine salicylate (ASPERCREME) 10 % cream   zonisamide (ZONEGRAN) 100 MG capsule   No  current facility-administered medications for this encounter.     Konrad Felix Ward, [name] [facility] [phone]

## 2021-03-04 NOTE — Anesthesia Preprocedure Evaluation (Addendum)
Anesthesia Evaluation  Patient identified by MRN, date of birth, ID band Patient awake    Reviewed: Allergy & Precautions, NPO status , Patient's Chart, lab work & pertinent test results, reviewed documented beta blocker date and time   History of Anesthesia Complications (+) PONV and history of anesthetic complications  Airway Mallampati: II  TM Distance: >3 FB Neck ROM: Full    Dental no notable dental hx. (+) Teeth Intact   Pulmonary sleep apnea , pneumonia, resolved,    Pulmonary exam normal breath sounds clear to auscultation       Cardiovascular hypertension, Pt. on medications +CHF  Normal cardiovascular exam Rhythm:Regular Rate:Normal  EKG 05/20/20 NSR, normal  Echo 06/09/20 1. Left ventricular ejection fraction, by estimation, is 60 to 65%. The left ventricle has normal function. The left ventricle has no regional wall motion abnormalities. There is mild concentric left ventricular hypertrophy. Left ventricular diastolic parameters are consistent with Grade I diastolic dysfunction (impaired relaxation).  2. Right ventricular systolic function is normal. The right ventricular size is normal. Tricuspid regurgitation signal is inadequate for assessing PA pressure.  3. The mitral valve is normal in structure. Trivial mitral valve regurgitation.  4. The aortic valve is tricuspid. Aortic valve regurgitation is trivial.  5. The inferior vena cava is normal in size with greater than 50% respiratory variability, suggesting right atrial pressure of 3 mmHg.   Myocardial Perfusion study 06/09/20  The left ventricular ejection fraction is hyperdynamic (>65%).  Nuclear stress EF: 66%.  There was no ST segment deviation noted during stress.  No T wave inversion was noted during stress.  The study is normal.  This is a low risk study.   1. Normal study without ischemia or infarction.  2. Normal LVEF, >65%.  3. This is a low-risk  study.    Neuro/Psych  Headaches, Seizures -, Well Controlled,  PSYCHIATRIC DISORDERS Anxiety Depression Peripheral neuropathy  Neuromuscular disease    GI/Hepatic Neg liver ROS, GERD  Medicated and Controlled,  Endo/Other  diabetes, Well Controlled, Type 2, Oral Hypoglycemic AgentsHypothyroidism Hyperlipidemia Hx/o hyperparathyroidism Obesity   Renal/GU Renal InsufficiencyRenal disease  negative genitourinary   Musculoskeletal  (+) Arthritis , Osteoarthritis,  Rotator Cuff Arthropathy right shoulder   Abdominal (+) + obese,   Peds  Hematology negative hematology ROS (+)   Anesthesia Other Findings   Reproductive/Obstetrics                                                            Anesthesia Evaluation  Patient identified by MRN, date of birth, ID band Patient awake    Reviewed: Allergy & Precautions, NPO status , Patient's Chart, lab work & pertinent test results  History of Anesthesia Complications (+) PONV  Airway Mallampati: II  TM Distance: >3 FB Neck ROM: Full    Dental  (+) Teeth Intact   Pulmonary sleep apnea and Continuous Positive Airway Pressure Ventilation ,    Pulmonary exam normal        Cardiovascular hypertension, Pt. on medications  Rhythm:Regular Rate:Normal     Neuro/Psych  Headaches, Seizures - (02/21 last episode), Well Controlled,  Anxiety Depression    GI/Hepatic Neg liver ROS, GERD  Medicated and Controlled,  Endo/Other  diabetes, Type 2, Oral Hypoglycemic AgentsHypothyroidism   Renal/GU   negative genitourinary  Musculoskeletal  (+) Arthritis , Osteoarthritis,    Abdominal (+)  Abdomen: soft. Bowel sounds: normal.  Peds  Hematology negative hematology ROS (+)   Anesthesia Other Findings   Reproductive/Obstetrics                           Anesthesia Physical Anesthesia Plan  ASA: III  Anesthesia Plan: General and Regional   Post-op Pain Management:   Regional for Post-op pain   Induction: Intravenous  PONV Risk Score and Plan: Ondansetron, Dexamethasone, Midazolam and Treatment may vary due to age or medical condition  Airway Management Planned: Mask and Oral ETT  Additional Equipment: None  Intra-op Plan:   Post-operative Plan: Extubation in OR  Informed Consent: I have reviewed the patients History and Physical, chart, labs and discussed the procedure including the risks, benefits and alternatives for the proposed anesthesia with the patient or authorized representative who has indicated his/her understanding and acceptance.     Dental advisory given  Plan Discussed with: CRNA  Anesthesia Plan Comments: (See PAT note 06/26/2020, Jodell Cipro, [name] Stress 01/22:  The left ventricular ejection fraction is hyperdynamic (>65%).  Nuclear stress EF: 66%.  There was no ST segment deviation noted during stress.  No T wave inversion was noted during stress.  The study is normal.  This is a low risk study. 1. Normal study without ischemia or infarction.  2. Normal LVEF, >65%.  3. This is a low-risk study.  ECHO 01/22: 1. Left ventricular ejection fraction, by estimation, is 60 to 65%. The  left ventricle has normal function. The left ventricle has no regional  wall motion abnormalities. There is mild concentric left ventricular  hypertrophy. Left ventricular diastolic  parameters are consistent with Grade I diastolic dysfunction (impaired  relaxation).  2. Right ventricular systolic function is normal. The right ventricular  size is normal. Tricuspid regurgitation signal is inadequate for assessing  PA pressure.  3. The mitral valve is normal in structure. Trivial mitral valve  regurgitation.  4. The aortic valve is tricuspid. Aortic valve regurgitation is trivial.  5. The inferior vena cava is normal in size with greater than 50%  respiratory variability, suggesting right atrial pressure of 3 mmHg.)       Anesthesia Quick Evaluation  Anesthesia Physical Anesthesia Plan  ASA: 3  Anesthesia Plan: General   Post-op Pain Management:  Regional for Post-op pain   Induction: Intravenous  PONV Risk Score and Plan: 4 or greater and Treatment may vary due to age or medical condition  Airway Management Planned: Oral ETT  Additional Equipment:   Intra-op Plan:   Post-operative Plan: Extubation in OR  Informed Consent: I have reviewed the patients History and Physical, chart, labs and discussed the procedure including the risks, benefits and alternatives for the proposed anesthesia with the patient or authorized representative who has indicated his/her understanding and acceptance.     Dental advisory given  Plan Discussed with: CRNA and Anesthesiologist  Anesthesia Plan Comments: (See PAT note 03/03/2021, Jodell Cipro Ward, [name])       Anesthesia Quick Evaluation

## 2021-03-05 ENCOUNTER — Ambulatory Visit (HOSPITAL_COMMUNITY): Payer: Medicare Other | Admitting: Physician Assistant

## 2021-03-05 ENCOUNTER — Ambulatory Visit (HOSPITAL_COMMUNITY): Payer: Medicare Other

## 2021-03-05 ENCOUNTER — Encounter (HOSPITAL_COMMUNITY)
Admission: RE | Disposition: A | Discharge status: Home / Self Care | Payer: Self-pay | Source: Ambulatory Visit | Attending: Orthopedic Surgery

## 2021-03-05 ENCOUNTER — Ambulatory Visit (HOSPITAL_COMMUNITY): Payer: Medicare Other | Admitting: Certified Registered Nurse Anesthetist

## 2021-03-05 ENCOUNTER — Ambulatory Visit (HOSPITAL_COMMUNITY)
Admission: RE | Admit: 2021-03-05 | Discharge: 2021-03-05 | Disposition: A | Discharge status: Home / Self Care | Payer: Medicare Other | Source: Ambulatory Visit | Attending: Orthopedic Surgery | Admitting: Orthopedic Surgery

## 2021-03-05 ENCOUNTER — Encounter (HOSPITAL_COMMUNITY): Payer: Self-pay | Admitting: Orthopedic Surgery

## 2021-03-05 DIAGNOSIS — E785 Hyperlipidemia, unspecified: Secondary | ICD-10-CM | POA: Insufficient documentation

## 2021-03-05 DIAGNOSIS — Z791 Long term (current) use of non-steroidal anti-inflammatories (NSAID): Secondary | ICD-10-CM | POA: Diagnosis not present

## 2021-03-05 DIAGNOSIS — Z6833 Body mass index (BMI) 33.0-33.9, adult: Secondary | ICD-10-CM | POA: Diagnosis not present

## 2021-03-05 DIAGNOSIS — E1122 Type 2 diabetes mellitus with diabetic chronic kidney disease: Secondary | ICD-10-CM | POA: Insufficient documentation

## 2021-03-05 DIAGNOSIS — Z888 Allergy status to other drugs, medicaments and biological substances status: Secondary | ICD-10-CM | POA: Diagnosis not present

## 2021-03-05 DIAGNOSIS — M75101 Unspecified rotator cuff tear or rupture of right shoulder, not specified as traumatic: Secondary | ICD-10-CM | POA: Diagnosis not present

## 2021-03-05 DIAGNOSIS — Z885 Allergy status to narcotic agent status: Secondary | ICD-10-CM | POA: Diagnosis not present

## 2021-03-05 DIAGNOSIS — I5032 Chronic diastolic (congestive) heart failure: Secondary | ICD-10-CM | POA: Diagnosis not present

## 2021-03-05 DIAGNOSIS — Z7984 Long term (current) use of oral hypoglycemic drugs: Secondary | ICD-10-CM | POA: Insufficient documentation

## 2021-03-05 DIAGNOSIS — E039 Hypothyroidism, unspecified: Secondary | ICD-10-CM | POA: Diagnosis not present

## 2021-03-05 DIAGNOSIS — M75102 Unspecified rotator cuff tear or rupture of left shoulder, not specified as traumatic: Secondary | ICD-10-CM | POA: Diagnosis not present

## 2021-03-05 DIAGNOSIS — M25511 Pain in right shoulder: Secondary | ICD-10-CM | POA: Diagnosis not present

## 2021-03-05 DIAGNOSIS — K219 Gastro-esophageal reflux disease without esophagitis: Secondary | ICD-10-CM | POA: Insufficient documentation

## 2021-03-05 DIAGNOSIS — I130 Hypertensive heart and chronic kidney disease with heart failure and stage 1 through stage 4 chronic kidney disease, or unspecified chronic kidney disease: Secondary | ICD-10-CM | POA: Diagnosis not present

## 2021-03-05 DIAGNOSIS — Z7982 Long term (current) use of aspirin: Secondary | ICD-10-CM | POA: Diagnosis not present

## 2021-03-05 DIAGNOSIS — E669 Obesity, unspecified: Secondary | ICD-10-CM | POA: Insufficient documentation

## 2021-03-05 DIAGNOSIS — Z471 Aftercare following joint replacement surgery: Secondary | ICD-10-CM | POA: Diagnosis not present

## 2021-03-05 DIAGNOSIS — N2581 Secondary hyperparathyroidism of renal origin: Secondary | ICD-10-CM | POA: Diagnosis not present

## 2021-03-05 DIAGNOSIS — M12811 Other specific arthropathies, not elsewhere classified, right shoulder: Secondary | ICD-10-CM

## 2021-03-05 DIAGNOSIS — M19012 Primary osteoarthritis, left shoulder: Secondary | ICD-10-CM | POA: Diagnosis not present

## 2021-03-05 DIAGNOSIS — G4730 Sleep apnea, unspecified: Secondary | ICD-10-CM | POA: Diagnosis not present

## 2021-03-05 DIAGNOSIS — G8918 Other acute postprocedural pain: Secondary | ICD-10-CM | POA: Diagnosis not present

## 2021-03-05 DIAGNOSIS — Z96611 Presence of right artificial shoulder joint: Secondary | ICD-10-CM

## 2021-03-05 DIAGNOSIS — N183 Chronic kidney disease, stage 3 unspecified: Secondary | ICD-10-CM | POA: Insufficient documentation

## 2021-03-05 HISTORY — PX: REVERSE SHOULDER ARTHROPLASTY: SHX5054

## 2021-03-05 LAB — TYPE AND SCREEN
ABO/RH(D): O NEG
Antibody Screen: NEGATIVE

## 2021-03-05 LAB — GLUCOSE, CAPILLARY: Glucose-Capillary: 99 mg/dL (ref 70–99)

## 2021-03-05 SURGERY — ARTHROPLASTY, SHOULDER, TOTAL, REVERSE
Anesthesia: General | Site: Shoulder | Laterality: Right

## 2021-03-05 MED ORDER — TIZANIDINE HCL 2 MG PO TABS: 2.0000 mg | ORAL_TABLET | Freq: Three times a day (TID) | ORAL | 0 refills | Status: DC | PRN

## 2021-03-05 MED ORDER — TRANEXAMIC ACID-NACL 1000-0.7 MG/100ML-% IV SOLN
1000.0000 mg | INTRAVENOUS | Status: AC
  Administered 2021-03-05: 1000 mg via INTRAVENOUS
  Filled 2021-03-05: qty 100

## 2021-03-05 MED ORDER — SODIUM CHLORIDE 0.9 % IR SOLN
Status: DC | PRN
  Administered 2021-03-05: 1000 mL

## 2021-03-05 MED ORDER — DROPERIDOL 2.5 MG/ML IJ SOLN: 0.6250 mg | Freq: Once | INTRAMUSCULAR | Status: DC | PRN

## 2021-03-05 MED ORDER — METHOCARBAMOL 500 MG PO TABS: 500.0000 mg | ORAL_TABLET | Freq: Four times a day (QID) | ORAL | Status: DC | PRN

## 2021-03-05 MED ORDER — MIDAZOLAM HCL 2 MG/2ML IJ SOLN
1.0000 mg | INTRAMUSCULAR | Status: DC
  Administered 2021-03-05: 1 mg via INTRAVENOUS
  Filled 2021-03-05: qty 2

## 2021-03-05 MED ORDER — SUGAMMADEX SODIUM 200 MG/2ML IV SOLN
INTRAVENOUS | Status: DC | PRN
Start: 2021-03-05 — End: 2021-03-05
  Administered 2021-03-05: 240 mg via INTRAVENOUS

## 2021-03-05 MED ORDER — DEXAMETHASONE SODIUM PHOSPHATE 10 MG/ML IJ SOLN
INTRAMUSCULAR | Status: DC | PRN
  Administered 2021-03-05: 5 mg via INTRAVENOUS

## 2021-03-05 MED ORDER — CEFAZOLIN SODIUM-DEXTROSE 2-4 GM/100ML-% IV SOLN
2.0000 g | INTRAVENOUS | Status: AC
  Administered 2021-03-05: 2 g via INTRAVENOUS
  Filled 2021-03-05: qty 100

## 2021-03-05 MED ORDER — ORAL CARE MOUTH RINSE: 15.0000 mL | Freq: Once | OROMUCOSAL | Status: AC

## 2021-03-05 MED ORDER — OXYCODONE-ACETAMINOPHEN 5-325 MG PO TABS
1.0000 | ORAL_TABLET | ORAL | 0 refills | Status: DC | PRN
Start: 2021-03-05 — End: 2021-05-26

## 2021-03-05 MED ORDER — FENTANYL CITRATE (PF) 100 MCG/2ML IJ SOLN
INTRAMUSCULAR | Status: DC | PRN
  Administered 2021-03-05: 50 ug via INTRAVENOUS

## 2021-03-05 MED ORDER — BUPIVACAINE HCL (PF) 0.5 % IJ SOLN
INTRAMUSCULAR | Status: DC | PRN
  Administered 2021-03-05: 15 mL via PERINEURAL

## 2021-03-05 MED ORDER — METHOCARBAMOL 500 MG IVPB - SIMPLE MED: 500.0000 mg | Freq: Four times a day (QID) | INTRAVENOUS | Status: DC | PRN

## 2021-03-05 MED ORDER — OXYCODONE HCL 5 MG PO TABS: 10.0000 mg | ORAL_TABLET | ORAL | Status: DC | PRN

## 2021-03-05 MED ORDER — ROCURONIUM BROMIDE 10 MG/ML (PF) SYRINGE
PREFILLED_SYRINGE | INTRAVENOUS | Status: DC | PRN
  Administered 2021-03-05: 60 mg via INTRAVENOUS

## 2021-03-05 MED ORDER — PROPOFOL 10 MG/ML IV BOLUS
INTRAVENOUS | Status: AC
  Filled 2021-03-05: qty 20

## 2021-03-05 MED ORDER — ONDANSETRON HCL 4 MG/2ML IJ SOLN
INTRAMUSCULAR | Status: AC
  Filled 2021-03-05: qty 2

## 2021-03-05 MED ORDER — LACTATED RINGERS IV SOLN: INTRAVENOUS | Status: DC

## 2021-03-05 MED ORDER — CHLORHEXIDINE GLUCONATE 0.12 % MT SOLN
15.0000 mL | Freq: Once | OROMUCOSAL | Status: AC
  Administered 2021-03-05: 15 mL via OROMUCOSAL

## 2021-03-05 MED ORDER — PROPOFOL 10 MG/ML IV BOLUS
INTRAVENOUS | Status: DC | PRN
  Administered 2021-03-05: 150 mg via INTRAVENOUS

## 2021-03-05 MED ORDER — FENTANYL CITRATE PF 50 MCG/ML IJ SOSY: 25.0000 ug | PREFILLED_SYRINGE | INTRAMUSCULAR | Status: DC | PRN

## 2021-03-05 MED ORDER — DEXAMETHASONE SODIUM PHOSPHATE 10 MG/ML IJ SOLN
INTRAMUSCULAR | Status: AC
  Filled 2021-03-05: qty 1

## 2021-03-05 MED ORDER — BUPIVACAINE LIPOSOME 1.3 % IJ SUSP
INTRAMUSCULAR | Status: DC | PRN
  Administered 2021-03-05: 10 mL via PERINEURAL

## 2021-03-05 MED ORDER — FENTANYL CITRATE PF 50 MCG/ML IJ SOSY
50.0000 ug | PREFILLED_SYRINGE | INTRAMUSCULAR | Status: DC
  Administered 2021-03-05: 50 ug via INTRAVENOUS
  Filled 2021-03-05: qty 2

## 2021-03-05 MED ORDER — 0.9 % SODIUM CHLORIDE (POUR BTL) OPTIME
TOPICAL | Status: DC | PRN
  Administered 2021-03-05: 1000 mL

## 2021-03-05 MED ORDER — OXYCODONE HCL 5 MG/5ML PO SOLN: 5.0000 mg | Freq: Once | ORAL | Status: DC | PRN

## 2021-03-05 MED ORDER — ONDANSETRON HCL 4 MG/2ML IJ SOLN
INTRAMUSCULAR | Status: DC | PRN
  Administered 2021-03-05: 4 mg via INTRAVENOUS

## 2021-03-05 MED ORDER — WATER FOR IRRIGATION, STERILE IR SOLN
Status: DC | PRN
  Administered 2021-03-05: 2000 mL

## 2021-03-05 MED ORDER — ROCURONIUM BROMIDE 10 MG/ML (PF) SYRINGE
PREFILLED_SYRINGE | INTRAVENOUS | Status: AC
  Filled 2021-03-05: qty 20

## 2021-03-05 MED ORDER — LIDOCAINE 2% (20 MG/ML) 5 ML SYRINGE
INTRAMUSCULAR | Status: DC | PRN
  Administered 2021-03-05: 60 mg via INTRAVENOUS

## 2021-03-05 MED ORDER — OXYCODONE HCL 5 MG PO TABS: 5.0000 mg | ORAL_TABLET | ORAL | Status: DC | PRN

## 2021-03-05 MED ORDER — OXYCODONE HCL 5 MG PO TABS: 5.0000 mg | ORAL_TABLET | Freq: Once | ORAL | Status: DC | PRN

## 2021-03-05 MED ORDER — ONDANSETRON HCL 4 MG/2ML IJ SOLN: 4.0000 mg | Freq: Once | INTRAMUSCULAR | Status: DC | PRN

## 2021-03-05 MED ORDER — PHENYLEPHRINE HCL-NACL 20-0.9 MG/250ML-% IV SOLN
INTRAVENOUS | Status: DC | PRN
  Administered 2021-03-05: 15 ug/min via INTRAVENOUS

## 2021-03-05 MED ORDER — FENTANYL CITRATE (PF) 100 MCG/2ML IJ SOLN
INTRAMUSCULAR | Status: AC
  Filled 2021-03-05: qty 2

## 2021-03-05 SURGICAL SUPPLY — 78 items
AID PSTN UNV HD RSTRNT DISP (MISCELLANEOUS)
BAG COUNTER SPONGE SURGICOUNT (BAG) IMPLANT
BAG SPEC THK2 15X12 ZIP CLS (MISCELLANEOUS) ×1
BAG SPNG CNTER NS LX DISP (BAG)
BAG ZIPLOCK 12X15 (MISCELLANEOUS) ×2 IMPLANT
BASEPLATE P2 COATD GLND 6.5X30 (Shoulder) IMPLANT
BIT DRILL 1.6MX128 (BIT) IMPLANT
BIT DRILL 2.5 DIA 127 CALI (BIT) ×1 IMPLANT
BIT DRILL 4 DIA CALIBRATED (BIT) ×1 IMPLANT
BLADE SAW SAG 73X25 THK (BLADE) ×1
BLADE SAW SGTL 73X25 THK (BLADE) ×1 IMPLANT
BOOTIES KNEE HIGH SLOAN (MISCELLANEOUS) ×4 IMPLANT
BSPLAT GLND 30 STRL LF SHLDR (Shoulder) ×1 IMPLANT
COOLER ICEMAN CLASSIC (MISCELLANEOUS) IMPLANT
COVER BACK TABLE 60X90IN (DRAPES) ×2 IMPLANT
COVER SURGICAL LIGHT HANDLE (MISCELLANEOUS) ×2 IMPLANT
DRAPE INCISE IOBAN 66X45 STRL (DRAPES) ×2 IMPLANT
DRAPE ORTHO SPLIT 77X108 STRL (DRAPES) ×4
DRAPE POUCH INSTRU U-SHP 10X18 (DRAPES) ×2 IMPLANT
DRAPE SHEET LG 3/4 BI-LAMINATE (DRAPES) ×2 IMPLANT
DRAPE SURG 17X11 SM STRL (DRAPES) ×2 IMPLANT
DRAPE SURG ORHT 6 SPLT 77X108 (DRAPES) ×2 IMPLANT
DRAPE TOP 10253 STERILE (DRAPES) ×2 IMPLANT
DRAPE U-SHAPE 47X51 STRL (DRAPES) ×2 IMPLANT
DRSG AQUACEL AG ADV 3.5X 6 (GAUZE/BANDAGES/DRESSINGS) ×2 IMPLANT
DURAPREP 26ML APPLICATOR (WOUND CARE) ×4 IMPLANT
ELECT BLADE TIP CTD 4 INCH (ELECTRODE) ×2 IMPLANT
ELECT REM PT RETURN 15FT ADLT (MISCELLANEOUS) ×2 IMPLANT
GLOVE SRG 8 PF TXTR STRL LF DI (GLOVE) ×1 IMPLANT
GLOVE SURG ENC MOIS LTX SZ7.5 (GLOVE) ×2 IMPLANT
GLOVE SURG POLYISO LF SZ6.5 (GLOVE) ×2 IMPLANT
GLOVE SURG UNDER POLY LF SZ6.5 (GLOVE) ×2 IMPLANT
GLOVE SURG UNDER POLY LF SZ8 (GLOVE) ×2
GOWN STRL REUS W/TWL LRG LVL3 (GOWN DISPOSABLE) ×2 IMPLANT
GOWN STRL REUS W/TWL XL LVL3 (GOWN DISPOSABLE) ×2 IMPLANT
HANDPIECE INTERPULSE COAX TIP (DISPOSABLE) ×2
HOOD PEEL AWAY FLYTE STAYCOOL (MISCELLANEOUS) ×6 IMPLANT
INSERT SMALL SOCKET 32MM (Insert) ×1 IMPLANT
KIT BASIN OR (CUSTOM PROCEDURE TRAY) ×2 IMPLANT
MANIFOLD NEPTUNE II (INSTRUMENTS) ×2 IMPLANT
NDL TROCAR POINT SZ 2 1/2 (NEEDLE) IMPLANT
NEEDLE TROCAR POINT SZ 2 1/2 (NEEDLE) IMPLANT
NS IRRIG 1000ML POUR BTL (IV SOLUTION) ×2 IMPLANT
P2 COATDE GLNOID BSEPLT 6.5X30 (Shoulder) ×2 IMPLANT
PACK SHOULDER (CUSTOM PROCEDURE TRAY) ×2 IMPLANT
PAD COLD SHLDR WRAP-ON (PAD) IMPLANT
PROTECTOR NERVE ULNAR (MISCELLANEOUS) IMPLANT
RESTRAINT HEAD UNIVERSAL NS (MISCELLANEOUS) IMPLANT
RETRIEVER SUT HEWSON (MISCELLANEOUS) IMPLANT
SCREW BONE LOCKING RSP 5.0X14 (Screw) ×2 IMPLANT
SCREW BONE RSP LOCK 5X14 (Screw) IMPLANT
SCREW BONE RSP LOCK 5X18 (Screw) IMPLANT
SCREW BONE RSP LOCK 5X26 (Screw) IMPLANT
SCREW BONE RSP LOCKING 18MM LG (Screw) ×2 IMPLANT
SCREW BONE RSP LOCKING 5.0X26 (Screw) ×4 IMPLANT
SCREW RETAIN W/HEAD 4MM OFFSET (Shoulder) ×1 IMPLANT
SET HNDPC FAN SPRY TIP SCT (DISPOSABLE) ×1 IMPLANT
SLING ARM FOAM STRAP MED (SOFTGOODS) ×1 IMPLANT
SLING ARM IMMOBILIZER LRG (SOFTGOODS) IMPLANT
SLING ARM IMMOBILIZER MED (SOFTGOODS) IMPLANT
SPONGE T-LAP 18X18 ~~LOC~~+RFID (SPONGE) ×2 IMPLANT
STEM HUMERAL 8X48 SHOULDER (Miscellaneous) ×1 IMPLANT
STRIP CLOSURE SKIN 1/2X4 (GAUZE/BANDAGES/DRESSINGS) ×4 IMPLANT
SUCTION FRAZIER HANDLE 10FR (MISCELLANEOUS)
SUCTION TUBE FRAZIER 10FR DISP (MISCELLANEOUS) IMPLANT
SUPPORT WRAP ARM LG (MISCELLANEOUS) IMPLANT
SUT ETHIBOND 2 V 37 (SUTURE) IMPLANT
SUT FIBERWIRE #2 38 REV NDL BL (SUTURE)
SUT MNCRL AB 4-0 PS2 18 (SUTURE) ×2 IMPLANT
SUT VIC AB 2-0 CT1 27 (SUTURE) ×2
SUT VIC AB 2-0 CT1 TAPERPNT 27 (SUTURE) ×1 IMPLANT
SUTURE FIBERWR#2 38 REV NDL BL (SUTURE) IMPLANT
TAPE LABRALWHITE 1.5X36 (TAPE) IMPLANT
TAPE STRIPS DRAPE STRL (GAUZE/BANDAGES/DRESSINGS) ×1 IMPLANT
TAPE SUT LABRALTAP WHT/BLK (SUTURE) IMPLANT
TOWEL OR 17X26 10 PK STRL BLUE (TOWEL DISPOSABLE) ×2 IMPLANT
TOWEL OR NON WOVEN STRL DISP B (DISPOSABLE) ×2 IMPLANT
WATER STERILE IRR 1000ML POUR (IV SOLUTION) ×2 IMPLANT

## 2021-03-05 NOTE — Op Note (Signed)
Procedure(s): REVERSE SHOULDER ARTHROPLASTY Procedure Note  Jennifer Davidson female 65 y.o. 03/05/2021   Preoperative diagnosis: Right shoulder end-stage rotator cuff tear arthropathy  Postoperative diagnosis: Same  Procedure(s) and Anesthesia Type:    * REVERSE SHOULDER ARTHROPLASTY - Choice   Indications:  65 y.o. female  With endstage right shoulder rotator cuff tear arthropathy. Pain and dysfunction interfered with quality of life and nonoperative treatment with activity modification, NSAIDS and injections failed.     Surgeon: Glennon Hamilton   Assistants: Fredia Sorrow [name] Amber was present and scrubbed throughout the procedure and was essential in positioning, retraction, exposure, and closure)  Anesthesia: General endotracheal anesthesia with preoperative interscalene block given by the attending anesthesiologist     Procedure Detail  REVERSE SHOULDER ARTHROPLASTY   Estimated Blood Loss:  200 mL         Drains: none  Blood Given: none          Specimens: none        Complications:  * No complications entered in OR log *         Disposition: PACU - hemodynamically stable.         Condition: stable      OPERATIVE FINDINGS:  A DJO Altivate pressfit reverse total shoulder arthroplasty was placed with a  size 8 stem, a 32-4 glenosphere, and a +4-mm poly insert. The base plate  fixation was excellent.  PROCEDURE: The patient was identified in the preoperative holding area  where I personally marked the operative site after verifying site, side,  and procedure with the patient. An interscalene block given by  the attending anesthesiologist in the holding area and the patient was taken back to the operating room where all extremities were  carefully padded in position after general anesthesia was induced. She  was placed in a beach-chair position and the operative upper extremity was  prepped and draped in a standard sterile fashion. An approximately 10-   cm incision was made from the tip of the coracoid process to the center  point of the humerus at the level of the axilla. Dissection was carried  down through subcutaneous tissues to the level of the cephalic vein  which was taken laterally with the deltoid. The pectoralis major was  retracted medially. The subdeltoid space was developed and the lateral  edge of the conjoined tendon was identified. The undersurface of  conjoined tendon was palpated and the musculocutaneous nerve was not in  the field. Retractor was placed underneath the conjoined and second  retractor was placed lateral into the deltoid. The circumflex humeral  artery and vessels were identified and clamped and coagulated. The  biceps tendon was absent.  The subscapularis was chronically torn.  The  joint was then gently externally rotated while the capsule was released  from the humeral neck around to just beyond the 6 o'clock position. At  this point, the joint was dislocated and the humeral head was presented  into the wound. The excessive osteophyte formation was removed with a  large rongeur.  The cutting guide was used to make the appropriate  head cut and the head was saved for potentially bone grafting.  The glenoid was exposed with the arm in an  abducted extended position. The anterior and posterior labrum were  completely excised and the capsule was released circumferentially to  allow for exposure of the glenoid for preparation. The 2.5 mm drill was  placed using the guide in 5-10 inferior angulation and  the tap was then advanced in the same hole. Small and large reamers were then used. The tap was then removed and the Metaglene was then screwed in with excellent purchase.  The peripheral guide was then used to drilled measured and filled peripheral locking screws. The size 32-4 glenosphere was then impacted on the [facility] taper and the central screw was placed. The humerus was then again exposed and the diaphyseal  reamers were used followed by the metaphyseal reamers. The final broach was left in place in the proximal trial was placed. The joint was reduced and with this implant it was felt that soft tissue tensioning was appropriate with excellent stability and excellent range of motion. Therefore, final humeral stem was placed press-fit with bone grafting.  And then the trial polyethylene inserts were tested again and the above implant was felt to be the most appropriate for final insertion. The joint was reduced taken through full range of motion and felt to be stable. Soft tissue tension was appropriate.  The joint was then copiously irrigated with pulse  lavage and the wound was then closed. The subscapularis was not repaired.  Skin was closed with 2-0 Vicryl in a deep dermal layer and 4-0  Monocryl for skin closure. Steri-Strips were applied. Sterile  dressings were then applied as well as a sling. The patient was allowed  to awaken from general anesthesia, transferred to stretcher, and taken  to recovery room in stable condition.   POSTOPERATIVE PLAN: The patient will be observed in the recovery room.  If her pain is well controlled with the regional block and she is hemodynamically stable she can be discharged home today with family.

## 2021-03-05 NOTE — Evaluation (Signed)
Occupational Therapy Evaluation Patient Details Name: Jennifer Davidson MRN: [id number] DOB: 12-Mar-1956 Today's Date: 03/05/2021   History of Present Illness 65 y/o female s/p right reverse total shoulder replacement under Dr. Ave Filter. Past medical history significant for ckd 3, severe major depressive disorder, chronic pain formerly on opioids, hypothyroid, questionable seizure disorder, polypharmacy and LT rTSA 06/2020.   Clinical Impression   Pt is a 65 year old female, s/p RT reverse shoulder replacement without functional use of RT dominant upper extremity secondary to effects of surgery and interscalene block and shoulder precautions. Therapist provided education and instruction to patient and spouse in regards to exercises, precautions, positioning, donning upper extremity clothing and bathing while maintaining shoulder precautions, ice and edema management and donning/doffing sling. Patient and spouse verbalized understanding and demonstrated as needed. Patient needed assistance to donn shirt, underwear, pants, socks and shoes and provided with instruction on compensatory strategies to perform ADLs. Patient limited by decreased ROM in RT shoulder so therefore will need some form of assistance at home. Patient and spouse verbalized and/or demonstrated understanding to all instruction. Patient to follow up with MD for further therapy needs.        Recommendations for follow up therapy are one component of a multi-disciplinary discharge planning process, led by the attending physician.  Recommendations may be updated based on patient status, additional functional criteria and insurance authorization.   Follow Up Recommendations  Follow surgeon's recommendation for DC plan and follow-up therapies    Equipment Recommendations  None recommended by OT    Recommendations for Other Services       Precautions / Restrictions Precautions Precautions: Shoulder Shoulder Interventions: Shoulder  sling/immobilizer;Off for dressing/bathing/exercises;At all times Precaution Booklet Issued: Yes (comment) Precaution Comments: No A/PROM Restrictions Weight Bearing Restrictions: Yes RUE Weight Bearing: Non weight bearing      Mobility Bed Mobility               General bed mobility comments: In PACU recliner    Transfers Overall transfer level: Modified independent               General transfer comment: Sit<>stand from recliner Mod I as pt moving slowly and cautiously. No LOB    Balance Overall balance assessment: No apparent balance deficits (not formally assessed);History of Falls                                         ADL either performed or assessed with clinical judgement   ADL Overall ADL's : Needs assistance/impaired Eating/Feeding: Set up;With caregiver independent assisting   Grooming: Set up;Standing;With caregiver independent assisting;Adhering to UE precautions   Upper Body Bathing: With caregiver independent assisting;Moderate assistance;Sitting;Adhering to UE precautions   Lower Body Bathing: Minimal assistance;With caregiver independent assisting   Upper Body Dressing : Moderate assistance;Adhering to UE precautions;With caregiver independent assisting;Cueing for sequencing   Lower Body Dressing: Minimal assistance;With caregiver independent assisting;Cueing for compensatory techniques;Sit to/from stand   Toilet Transfer: Modified Community education officer Details (indicate cue type and reason): Based on transfers to and from recliner Toileting- Clothing Manipulation and Hygiene: Minimal assistance;Sitting/lateral lean;Sit to/from stand;With caregiver independent assisting   Tub/ Engineer, structural: With caregiver independent assisting   Functional mobility during ADLs: Modified independent       Vision Baseline Vision/History: 1 Wears glasses Ability to See in Adequate Light: 0 Adequate Patient Visual Report: No  change from baseline  Vision Assessment?: No apparent visual deficits     Perception     Praxis      Pertinent Vitals/Pain       Hand Dominance Right   Extremity/Trunk Assessment Upper Extremity Assessment Upper Extremity Assessment: RUE deficits/detail;LUE deficits/detail RUE: Unable to fully assess due to immobilization RUE Sensation: decreased light touch LUE Deficits / Details: LT UE with [facility] WFL and intact stregnth.   Lower Extremity Assessment Lower Extremity Assessment: Defer to PT evaluation   Cervical / Trunk Assessment Cervical / Trunk Assessment: Normal   Communication Communication Communication: No difficulties   Cognition Arousal/Alertness: Awake/alert Behavior During Therapy: WFL for tasks assessed/performed Overall Cognitive Status: Within Functional Limits for tasks assessed                                     General Comments       Exercises Shoulder Exercises Elbow Flexion: AROM;Left;10 reps (Hand out on all exercises to hand/wrist/elbow provided and pt performed on LUE to demo undertaanding due to RUE numbness post-op) Elbow Extension: AROM;10 reps;Left Wrist Flexion: AROM;Left;10 reps Wrist Extension: AROM;Left;10 reps Digit Composite Flexion: AROM;Left;10 reps Composite Extension: AROM;Left;10 reps Neck Flexion: AROM;5 reps;Seated Neck Extension: AROM;5 reps;Seated Neck Lateral Flexion - Right: AROM;5 reps;Seated Neck Lateral Flexion - Left: AROM;5 reps;Seated Hand Exercises Forearm Supination: AROM;Left;10 reps Forearm Pronation: AROM;Left;10 reps;Seated   Shoulder Instructions Shoulder Instructions Donning/doffing shirt without moving shoulder: Minimal assistance;Patient able to independently direct caregiver;Caregiver independent with task Method for sponge bathing under operated UE: Caregiver independent with task;Patient able to independently direct caregiver;Moderate assistance Donning/doffing sling/immobilizer:  Patient able to independently direct caregiver;Caregiver independent with task;Moderate assistance Correct positioning of sling/immobilizer: Independent;Caregiver independent with task;Patient able to independently direct caregiver ROM for elbow, wrist and digits of operated UE: Caregiver independent with task;Patient able to independently direct caregiver (OT demo'd to Pt as pt remained numb at RUE and unable to perform. Pt did follow along with LT UE to demo understanding) Sling wearing schedule (on at all times/off for ADL's): Independent;Caregiver independent with task;Patient able to independently direct caregiver Proper positioning of operated UE when showering: Independent;Caregiver independent with task;Patient able to independently direct caregiver Dressing change: Independent;Caregiver independent with task;Patient able to independently direct caregiver Positioning of UE while sleeping: Independent;Caregiver independent with task;Patient able to independently direct caregiver    Home Living Family/patient expects to be discharged to:: Private residence Living Arrangements: Spouse/significant other Available Help at Discharge: Available 24 hours/day Type of Home: House Home Access: Stairs to enter [employer] of Steps: 1 Entrance Stairs-Rails: None Home Layout: One level     Bathroom Shower/Tub: Producer, television/film/video: Handicapped height     Home Equipment: Environmental consultant - 2 wheels;Shower seat - built in;Shower seat;Cane - quad;Cane - single point;Crutches   Additional Comments: Recliner      Prior Functioning/Environment Level of Independence: Independent        Comments: Pt reports Independence with BADLs, ambulation without AD, but did endorse 3 falls in past 12 months when getting up at night for bathroom. Pt educated on use of lights and giving self more time before standing at night to prevent future falls. Pt's husband sets up meds and performs all  cooking/cleaning/laundry        OT Problem List: Decreased knowledge of precautions;Impaired UE functional use;Decreased range of motion;Decreased strength      OT Treatment/Interventions:  OT Goals(Current goals can be found in the care plan section) Acute Rehab OT Goals Patient Stated Goal: Home with husband today OT Goal Formulation: With patient/family Time For Goal Achievement: 03/05/21 Potential to Achieve Goals: Good  OT Frequency:     Barriers to D/C:            Co-evaluation              AM-PAC OT "6 Clicks" Daily Activity     Outcome Measure Help from another person eating meals?: A Little Help from another person taking care of personal grooming?: A Little Help from another person toileting, which includes using toliet, bedpan, or urinal?: A Little Help from another person bathing (including washing, rinsing, drying)?: A Little Help from another person to put on and taking off regular upper body clothing?: A Little Help from another person to put on and taking off regular lower body clothing?: A Little 6 Click Score: 18   End of Session Equipment Utilized During Treatment: Other (comment) (RT UE SLING}) Nurse Communication:  (RN cleared OT to see)  Activity Tolerance: Patient tolerated treatment well Patient left: in chair;with family/visitor present (PACU recliner)                   Time: 2297-9892 OT Time Calculation (min): 34 min Charges:  OT General Charges $OT Visit: 1 Visit OT Evaluation $OT Eval Low Complexity: 1 Low OT Treatments $Self Care/Home Management : 8-22 mins  Victorino Dike, OT Acute Rehab Services Office: [phone] 03/05/2021   Theodoro Clock 03/05/2021, 1:23 PM

## 2021-03-05 NOTE — Transfer of Care (Signed)
Immediate Anesthesia Transfer of Care Note  Patient: SYREETA FIGLER  Procedure(s) Performed: REVERSE SHOULDER ARTHROPLASTY (Right: Shoulder)  Patient Location: PACU  Anesthesia Type:GA combined with regional for post-op pain  Level of Consciousness: awake, alert , oriented and patient cooperative  Airway & Oxygen Therapy: Patient Spontanous Breathing and Patient connected to face mask oxygen  Post-op Assessment: Report given to RN and Post -op Vital signs reviewed and stable  Post vital signs: Reviewed and stable  Last Vitals:  Vitals Value Taken Time  BP    Temp    Pulse 104 03/05/21 1129  Resp 19 03/05/21 1130  SpO2 91 % 03/05/21 1129  Vitals shown include unvalidated device data.  Last Pain:  Vitals:   03/05/21 0801  TempSrc:   PainSc: 9       Patients Stated Pain Goal: 5 (03/05/21 0801)  Complications: No notable events documented.

## 2021-03-05 NOTE — H&P (Signed)
Jennifer Davidson is an 65 y.o. female.   Chief Complaint: R shoulder pain and dysfunction HPI: Endstage R shoulder rotator cuff tear arthropathy with significant pain and dysfunction, failed conservative measures.  Pain interferes with sleep and quality of life.   Past Medical History:  Diagnosis Date   Acute cystitis without hematuria    Acute encephalopathy 06/25/2016   Altered mental status 06/23/2017   Anxiety    Anxiety and depression 02/04/2012   Arthritis    Cataract 08/31/2017   left eye   CHF (congestive heart failure) ([place]) 2017   resolved   Cholesterol serum increased    Chronic back pain    chronic Rt low back pain. s/p L4-5 fusion. failed Rt facet injections. poss due to Rt SI joint dysfunction.   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   Depression    Diabetes mellitus without complication ([place])    Diastolic heart failure ([place]) 03/17/2012   Edema 02/04/2012   Encounter for long-term (current) use of medications 02/04/2012   Fall as cause of accidental injury at home as place of occurrence 09/08/2018   GERD (gastroesophageal reflux disease) 11/13/2015   Hallucination, visual 09/14/2017   Head injury 09/08/2018   Hyperlipidemia    Hyperparathyroidism ([place])    Hyperparathyroidism, secondary renal ([place]) 07/31/2014   [facility] [name], MD  Plan:  25 Hydroxy Vit D (59.1 ng/ml)    Hypothyroidism    Moderate episode of recurrent major depressive disorder ([place]) 08/31/2016   PONV (postoperative nausea and vomiting)    Seizures ([place])    3 years ago,2018   Sepsis ([place]) 04/29/2020   Sleep apnea    cpap   Syncope 08/30/2015   Syncope and collapse 02/12/2012   Tachycardia 02/04/2012   Trochanteric bursitis of both hips 2012   Confirmed on MRI    Past Surgical History:  Procedure  Laterality Date   ABDOMINAL HYSTERECTOMY     APPENDECTOMY  1986   BACK SURGERY     L4-5 fusion   CATARACT EXTRACTION     KNEE SURGERY     REVERSE SHOULDER ARTHROPLASTY Left 07/03/2020   Procedure: REVERSE SHOULDER ARTHROPLASTY;  Surgeon: [name], MD;  Location: [facility];  Service: Orthopedics;  Laterality: Left;   SHOULDER ARTHROSCOPY WITH SUBACROMIAL DECOMPRESSION Left 10/20/2018   Procedure: SHOULDER ARTHROSCOPY, ROTATOR CUFF DEBRIDEMENT, GLENOHUMERAL JOINT DEBRIDEMENT, ACROMIOPLASTY, DISTAL CLAVICLE RESECTION;  Surgeon: [name], MD;  Location: [facility];  Service: Orthopedics;  Laterality: Left;    Family History  Problem Relation Age of Onset   Diabetes Mother    Heart disease Mother    Kidney disease Mother    Thyroid disease Mother    Heart disease Father    Seizures Father    COPD Father    Irritable bowel syndrome Father    Cancer Maternal Grandmother    Diabetes Sister    Diabetes Brother    Colon cancer Neg Hx    Stomach cancer Neg Hx    Colon polyps Neg Hx    Esophageal cancer Neg Hx    Rectal cancer Neg Hx    Social History:  reports that she has never smoked. She has never used smokeless tobacco. She reports that she does not drink alcohol and does not use drugs.  Allergies:  Allergies  Allergen Reactions  Tramadol Nausea And Vomiting   Trazodone And Nefazodone Other (See Comments)    Per pt trazodone caused hallucinations and behavior changes     Medications Prior to Admission  Medication Sig Dispense Refill   albuterol (VENTOLIN HFA) 108 (90 Base) MCG/ACT inhaler TAKE 2 PUFFS BY MOUTH EVERY 6 HOURS AS NEEDED FOR WHEEZE OR SHORTNESS OF BREATH 18 g 6   aspirin EC 81 MG tablet Take 1 tablet (81 mg total) by mouth daily. 90 tablet 3   blood glucose meter kit and supplies KIT Dispense based on patient and insurance preference. Use up to four times daily as directed. Check blood sugar three times weekly, then as needed. 1 each 0   buPROPion (WELLBUTRIN  XL) 150 MG 24 hr tablet Take 1 tablet (150 mg total) by mouth daily. 90 tablet 3   busPIRone (BUSPAR) 7.5 MG tablet Take 2 tablets (15 mg total) by mouth 3 (three) times daily. 540 tablet 1   cetirizine (ZYRTEC) 10 MG tablet Take 1 tablet (10 mg total) by mouth daily. 90 tablet 3   diclofenac sodium (VOLTAREN) 1 % GEL Apply 2 g topically daily as needed (for knee pain). 100 g 3   DULoxetine (CYMBALTA) 60 MG capsule Take 1 capsule (60 mg total) by mouth daily. 90 capsule 3   famotidine (PEPCID) 20 MG tablet Take 1 tablet (20 mg total) by mouth daily as needed for heartburn or indigestion. (Patient taking differently: Take 20 mg by mouth daily.) 90 tablet 3   fluticasone (FLONASE) 50 MCG/ACT nasal spray PLACE 2 SPRAYS AT BEDTIME INTO BOTH NOSTRILS. (Patient taking differently: Place 2 sprays into both nostrils daily as needed for allergies.) 16 g 5   gabapentin (NEURONTIN) 600 MG tablet Take 1 tablet (600 mg total) by mouth 3 (three) times daily. (Patient taking differently: Take 600 mg by mouth 2 (two) times daily.) 270 tablet 3   hydrOXYzine (ATARAX/VISTARIL) 50 MG tablet Take 1 tablet (50 mg total) by mouth every 6 (six) hours as needed. 360 tablet 2   levothyroxine (SYNTHROID) 50 MCG tablet Take 1 tablet (50 mcg total) by mouth every other day. Before breakfast. Alternate with 58mg tablet 90 tablet 1   levothyroxine (SYNTHROID) 75 MCG tablet Take 1 tablet (75 mcg total) by mouth every other day. Before breakfast. Alternate with 573m tablet 90 tablet 1   meclizine (ANTIVERT) 50 MG tablet Take 0.5 tablets (25 mg total) by mouth 2 (two) times daily as needed for dizziness. (Patient taking differently: Take 50 mg by mouth 2 (two) times daily as needed for dizziness.) 60 tablet 3   metFORMIN (GLUCOPHAGE) 500 MG tablet Take 1 tablet (500 mg total) by mouth 2 (two) times daily with a meal. 180 tablet 3   Multiple Vitamins-Minerals (MULTIVITAMIN WITH MINERALS) tablet Take 1 tablet by mouth daily.      oxyCODONE-acetaminophen (PERCOCET) 5-325 MG tablet Take 1-2 tablets by mouth every 6 (six) hours as needed for severe pain. 20 tablet 0   Polyvinyl Alcohol-Povidone PF (REFRESH) 1.4-0.6 % SOLN Place 1 drop into both eyes daily as needed (dry eyes).     prazosin (MINIPRESS) 5 MG capsule Take 2-3 capsules (10-15 mg total) by mouth at bedtime. TAKE 2 TO 3 CAPSULES AT BEDTIME 270 capsule 0   Probiotic Product (ALIGN) 4 MG CAPS Take 1 capsule (4 mg total) by mouth daily. 30 capsule 0   QUEtiapine (SEROQUEL) 300 MG tablet Take 1 tablet (300 mg total) by mouth at bedtime. TAKE 1 TABLET  BY MOUTH EVERYDAY AT BEDTIME 90 tablet 3   trolamine salicylate (ASPERCREME) 10 % cream Apply 1 application topically as needed for muscle pain.     zonisamide (ZONEGRAN) 100 MG capsule Take 1 capsule (100 mg total) by mouth daily. 30 capsule 5   promethazine (PHENERGAN) 25 MG suppository Place 1 suppository (25 mg total) rectally every 6 (six) hours as needed for nausea. (Patient not taking: Reported on 02/26/2021) 12 each 0   rizatriptan (MAXALT-MLT) 10 MG disintegrating tablet Take 1 tablet (10 mg total) by mouth as needed for migraine. May repeat in 2 hours if needed 36 tablet 3    Results for orders placed or performed during the hospital encounter of 03/05/21 (from the past 48 hour(s))  Glucose, capillary     Status: None   Collection Time: 03/05/21  8:11 AM  Result Value Ref Range   Glucose-Capillary 99 70 - 99 mg/dL    Comment: Glucose reference range applies only to samples taken after fasting for at least 8 hours.   DG Chest 2 View  Result Date: 03/03/2021 CLINICAL DATA:  Preop EXAM: CHEST - 2 VIEW COMPARISON:  April 29, 2020 FINDINGS: The cardiomediastinal silhouette is unchanged in contour. No pleural effusion. No pneumothorax. No acute pleuroparenchymal abnormality. Visualized abdomen is unremarkable. Status post LEFT shoulder arthroplasty. Degenerative changes of the RIGHT shoulder. IMPRESSION: No acute  cardiopulmonary abnormality. Electronically Signed   By: Valentino Saxon M.D.   On: 03/03/2021 15:17    Review of Systems  All other systems reviewed and are negative.  Blood pressure (!) 163/89, pulse 90, temperature 98 F (36.7 C), temperature source Oral, resp. rate 16, height _0  (1.575 m), weight 83 kg, SpO2 100 %. Physical Exam HENT:     Head: Atraumatic.  Eyes:     Extraocular Movements: Extraocular movements intact.  Cardiovascular:     Pulses: Normal pulses.  Pulmonary:     Effort: Pulmonary effort is normal.  Musculoskeletal:     Comments: R shoulder pain with limited ROM. NVID  Skin:    General: Skin is warm.  Neurological:     Mental Status: She is alert.  Psychiatric:        Mood and Affect: Mood normal.     Assessment/Plan Right shoulder end-stage rotator cuff arthropathy Plan right reverse total shoulder replacement Risks / benefits of surgery discussed Consent on chart  NPO for OR Preop antibiotics   Rhae Hammock, MD 03/05/2021, 9:04 AM

## 2021-03-05 NOTE — Progress Notes (Signed)
Assisted Dr. Foster with right, ultrasound guided, interscalene  block. Side rails up, monitors on throughout procedure. See vital signs in flow sheet. Tolerated Procedure well. 

## 2021-03-05 NOTE — Anesthesia Postprocedure Evaluation (Signed)
Anesthesia Post Note  Patient: ERIS HANNAN  Procedure(s) Performed: REVERSE SHOULDER ARTHROPLASTY (Right: Shoulder)     Patient location during evaluation: PACU Anesthesia Type: General Level of consciousness: awake and alert and oriented Pain management: pain level controlled Vital Signs Assessment: post-procedure vital signs reviewed and stable Respiratory status: spontaneous breathing, nonlabored ventilation and respiratory function stable Cardiovascular status: blood pressure returned to baseline and stable Postop Assessment: no apparent nausea or vomiting Anesthetic complications: no   No notable events documented.  Last Vitals:  Vitals:   03/05/21 1130 03/05/21 1145  BP: 138/81 105/89  Pulse: (!) 104 95  Resp: 19 19  Temp:    SpO2: 91% 93%    Last Pain:  Vitals:   03/05/21 1145  TempSrc:   PainSc: 0-No pain                 Makila Colombe A.

## 2021-03-05 NOTE — Discharge Instructions (Signed)

## 2021-03-05 NOTE — Anesthesia Procedure Notes (Signed)
Anesthesia Regional Block: Interscalene brachial plexus block   Pre-Anesthetic Checklist: , timeout performed,  Correct Patient, Correct Site, Correct Laterality,  Correct Procedure, Correct Position, site marked,  Risks and benefits discussed,  Surgical consent,  Pre-op evaluation,  At surgeon's request and post-op pain management  Laterality: Right  Prep: chloraprep       Needles:  Injection technique: Single-shot  Needle Type: Echogenic Stimulator Needle     Needle Length: 10cm  Needle Gauge: 21   Needle insertion depth: 6 cm   Additional Needles:   Procedures:,,,, ultrasound used (permanent image in chart),,    Narrative:  Start time: 03/05/2021 9:06 AM End time: 03/05/2021 9:11 AM Injection made incrementally with aspirations every 5 mL.  Performed by: Personally  Anesthesiologist: Mal Amabile, MD  Additional Notes: Timeout performed. Patient sedated. Relevant anatomy ID'd using [place]. Incremental 2-27ml injection of LA with frequent aspiration. Patient tolerated procedure well.     Right Interscalene Block

## 2021-03-05 NOTE — Anesthesia Procedure Notes (Signed)
Procedure Name: Intubation Date/Time: 03/05/2021 9:56 AM Performed by: West Pugh, CRNA Pre-anesthesia Checklist: Patient identified, Emergency Drugs available, Suction available, Patient being monitored and Timeout performed Patient Re-evaluated:Patient Re-evaluated prior to induction Oxygen Delivery Method: Circle system utilized Preoxygenation: Pre-oxygenation with 100% oxygen Induction Type: IV induction Ventilation: Mask ventilation without difficulty Laryngoscope Size: Mac and 3 Grade View: Grade I Tube type: Oral Tube size: 7.0 mm Number of attempts: 1 Airway Equipment and Method: Stylet Placement Confirmation: ETT inserted through vocal cords under direct vision, positive ETCO2, CO2 detector and breath sounds checked- equal and bilateral Tube secured with: Tape Dental Injury: Teeth and Oropharynx as per pre-operative assessment

## 2021-03-05 NOTE — Addendum Note (Signed)
Addendum  created 03/05/21 1226 by Wynonia Sours, CRNA   Flowsheet accepted

## 2021-03-11 ENCOUNTER — Encounter (HOSPITAL_COMMUNITY): Payer: Self-pay | Admitting: Orthopedic Surgery

## 2021-03-18 DIAGNOSIS — Z96611 Presence of right artificial shoulder joint: Secondary | ICD-10-CM | POA: Diagnosis not present

## 2021-03-22 ENCOUNTER — Other Ambulatory Visit (HOSPITAL_BASED_OUTPATIENT_CLINIC_OR_DEPARTMENT_OTHER): Payer: Self-pay | Admitting: Nurse Practitioner

## 2021-03-22 DIAGNOSIS — F5105 Insomnia due to other mental disorder: Secondary | ICD-10-CM

## 2021-03-22 DIAGNOSIS — F99 Mental disorder, not otherwise specified: Secondary | ICD-10-CM

## 2021-03-27 NOTE — Progress Notes (Signed)
NEUROLOGY FOLLOW UP OFFICE NOTE  Jennifer Davidson [id number]  Assessment/Plan:   Migraine without aura, without status migrainosus, not intractable  Migraine prevention:  zonisamide 169m daily Migraine rescue:  rizatriptan 168m  While she doesn't have absolute contraindication to triptans (CAD, stroke), age is a risk factor.  I will place order for Ubrelvy 10070ms it was effective (more effective than rizatriptan) Limit use of pain relievers to no more than 2 days out of week to prevent risk of rebound or medication-overuse headache. Keep headache diary Follow up one year   Subjective:  Jennifer Davidson a 65 66ar old right-handed female with hypothyroidism, depression, anxiety, OSA who follows up for migraines.   UPDATE: She tried UbrIranich was effective, migraine lasts 30 minutes. Intensity:  severe Duration:  90 min with rizatriptan, 30 minutes with UbrRoselyn Meierequency:  2 times a month Current NSAIDS/analgesics:  ASA 32m31mily, Excedrin Migraine Current triptans:  none Current ergotamine:  none Current anti-emetic:  none Current muscle relaxants:  none Current Antihypertensive medications:  prazosin Current Antidepressant medications:  Cymbalta 60mg39mly, Wellbutrin XL 150mg 32ment Anticonvulsant medications:  zonisamide 100mg Q34mabapentin 400mg QH56mrrent anti-CGRP:  none Current Vitamins/Herbal/Supplements:  none Current Antihistamines/Decongestants:  none Other therapy:  none Hormone/birth control:  none Other medications:  Seroquel, Synthroid   Caffeine:  No coffee.  1 Diet Dr. Pepper aMalachi Bondsiet:  Drinks water and Powerade.  Sometimes skips meals Exercise:  Walks  But not routine Depression:  yes; Anxiety:  yes Other pain:  Some shoulder pain. Sleep hygiene:  Poor.  Trouble staying asleep.  Has CPAP.    HISTORY:  Migraines since 10 years44ld.  Over the last 3 months, they have increased in frequency.  No known aggravating factor.  They are are usually  severe occipital pounding headaches associated with seeing black spots, nausea, vomiting, photophobia, phonophobia but no speech disturbance, numbness or weakness.  They last 6 to 12 hours. They occur about 3 days a week.  No known triggers.  Laying down in dark quiet room helps.    She has longstanding history of seizure-like episodes with left sided jerking or confusion.  She has been previously worked up by my colleManufacturing engineeruino, Delice Leschr events have been determined to be psychogenic.  Long-term EEG monitoring captured at least 3 events, which were normal.       Past NSAIDS/analgesics:  Ibuprofen, naproxen, Excedrin Migraine Past abortive triptans:  Sumatriptan 50mg, Ma34m-MLT 10mg Past52mrtive ergotamine:  none Past muscle relaxants:  Flexeril, Robaxin, Zanflex Past anti-emetic:  Zofran Past antihypertensive medications:  Propranolol, Lasix Past antidepressant medications:  Amitriptyline 75mg, cita58mam Past anticonvulsant medications:  topiramate Past anti-CGRP:  none Past vitamins/Herbal/Supplements:  Melatonin, MVI Past antihistamines/decongestants:  Meclizine, Allegra Other past therapies:  none   Prior imaging of brain performed, personally reviewed: 02/12/2012:  Normal CT head. 08/09/2016 MRI BRAIN WO:  Negative MRI of brain for age.  No intracranial abnormality. 06/23/2017 CTA HEAD & NECK:  Minor atheromatous change at the carotid bifurcations. No flow-limiting extracranial or intracranial stenosis or dissection.  No acute intracranial findings. No abnormal postcontrast enhancement. 06/24/2017 MRI BRAIN WO:  Stable since 2016 and normal for age noncontrast MRI appearance of the brain.     Family history of headache:  No  PAST MEDICAL HISTORY: Past Medical History:  Diagnosis Date   Acute cystitis without hematuria    Acute encephalopathy 06/25/2016   Altered mental status 06/23/2017  Anxiety    Anxiety and depression 02/04/2012   Arthritis     Cataract 08/31/2017   left eye   CHF (congestive heart failure) ([place]) 2017   resolved   Cholesterol serum increased    Chronic back pain    chronic Rt low back pain. s/p L4-5 fusion. failed Rt facet injections. poss due to Rt SI joint dysfunction.   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   Depression    Diabetes mellitus without complication ([place])    Diastolic heart failure ([place]) 03/17/2012   Edema 02/04/2012   Encounter for long-term (current) use of medications 02/04/2012   Fall as cause of accidental injury at home as place of occurrence 09/08/2018   GERD (gastroesophageal reflux disease) 11/13/2015   Hallucination, visual 09/14/2017   Head injury 09/08/2018   Hyperlipidemia    Hyperparathyroidism ([place])    Hyperparathyroidism, secondary renal ([place]) 07/31/2014   [facility] [name], MD  Plan:  25 Hydroxy Vit D (59.1 ng/ml)    Hypothyroidism    Moderate episode of recurrent major depressive disorder ([place]) 08/31/2016   PONV (postoperative nausea and vomiting)    Seizures ([place])    3 years ago,2018   Sepsis ([place]) 04/29/2020   Sleep apnea    cpap   Syncope 08/30/2015   Syncope and collapse 02/12/2012   Tachycardia 02/04/2012   Trochanteric bursitis of both hips 2012   Confirmed on MRI    MEDICATIONS: Current Outpatient Medications on File Prior to Visit  Medication Sig Dispense Refill   albuterol (VENTOLIN HFA) 108 (90 Base) MCG/ACT inhaler TAKE 2 PUFFS BY MOUTH EVERY 6 HOURS AS NEEDED FOR WHEEZE OR SHORTNESS OF BREATH 18 g 6   aspirin EC 81 MG tablet Take 1 tablet (81 mg total) by mouth daily. 90 tablet 3   blood glucose meter kit and supplies KIT Dispense based on patient and insurance preference. Use up to four times daily as directed. Check blood sugar three times weekly, then as  needed. 1 each 0   buPROPion (WELLBUTRIN XL) 150 MG 24 hr tablet Take 1 tablet (150 mg total) by mouth daily. 90 tablet 3   busPIRone (BUSPAR) 7.5 MG tablet Take 2 tablets (15 mg total) by mouth 3 (three) times daily. 540 tablet 1   cetirizine (ZYRTEC) 10 MG tablet Take 1 tablet (10 mg total) by mouth daily. 90 tablet 3   diclofenac sodium (VOLTAREN) 1 % GEL Apply 2 g topically daily as needed (for knee pain). 100 g 3   DULoxetine (CYMBALTA) 60 MG capsule Take 1 capsule (60 mg total) by mouth daily. 90 capsule 3   famotidine (PEPCID) 20 MG tablet Take 1 tablet (20 mg total) by mouth daily as needed for heartburn or indigestion. (Patient taking differently: Take 20 mg by mouth daily.) 90 tablet 3   fluticasone (FLONASE) 50 MCG/ACT nasal spray PLACE 2 SPRAYS AT BEDTIME INTO BOTH NOSTRILS. (Patient taking differently: Place 2 sprays into both nostrils daily as needed for allergies.) 16 g 5   gabapentin (NEURONTIN) 600 MG tablet Take 1 tablet (600 mg total) by mouth 3 (three) times daily. (Patient taking differently: Take 600 mg by mouth 2 (two) times daily.) 270 tablet 3   hydrOXYzine (ATARAX/VISTARIL) 50 MG tablet Take 1 tablet (50 mg total) by mouth every 6 (six) hours as  needed. 360 tablet 2   levothyroxine (SYNTHROID) 50 MCG tablet Take 1 tablet (50 mcg total) by mouth every other day. Before breakfast. Alternate with 66mg tablet 90 tablet 1   levothyroxine (SYNTHROID) 75 MCG tablet Take 1 tablet (75 mcg total) by mouth every other day. Before breakfast. Alternate with 563m tablet 90 tablet 1   meclizine (ANTIVERT) 50 MG tablet Take 0.5 tablets (25 mg total) by mouth 2 (two) times daily as needed for dizziness. (Patient taking differently: Take 50 mg by mouth 2 (two) times daily as needed for dizziness.) 60 tablet 3   metFORMIN (GLUCOPHAGE) 500 MG tablet Take 1 tablet (500 mg total) by mouth 2 (two) times daily with a meal. 180 tablet 3   Multiple Vitamins-Minerals (MULTIVITAMIN WITH MINERALS)  tablet Take 1 tablet by mouth daily.     oxyCODONE-acetaminophen (PERCOCET) 5-325 MG tablet Take 1 tablet by mouth every 4 (four) hours as needed for severe pain. 30 tablet 0   Polyvinyl Alcohol-Povidone PF (REFRESH) 1.4-0.6 % SOLN Place 1 drop into both eyes daily as needed (dry eyes).     prazosin (MINIPRESS) 5 MG capsule TAKE 2-3 CAPSULES (10-15 MG TOTAL) BY MOUTH AT BEDTIME. TAKE 2 TO 3 CAPSULES AT BEDTIME 270 capsule 0   Probiotic Product (ALIGN) 4 MG CAPS Take 1 capsule (4 mg total) by mouth daily. 30 capsule 0   promethazine (PHENERGAN) 25 MG suppository Place 1 suppository (25 mg total) rectally every 6 (six) hours as needed for nausea. (Patient not taking: Reported on 02/26/2021) 12 each 0   QUEtiapine (SEROQUEL) 300 MG tablet Take 1 tablet (300 mg total) by mouth at bedtime. TAKE 1 TABLET BY MOUTH EVERYDAY AT BEDTIME 90 tablet 3   rizatriptan (MAXALT-MLT) 10 MG disintegrating tablet Take 1 tablet (10 mg total) by mouth as needed for migraine. May repeat in 2 hours if needed 36 tablet 3   tiZANidine (ZANAFLEX) 2 MG tablet Take 1 tablet (2 mg total) by mouth every 8 (eight) hours as needed for muscle spasms. 20 tablet 0   trolamine salicylate (ASPERCREME) 10 % cream Apply 1 application topically as needed for muscle pain.     zonisamide (ZONEGRAN) 100 MG capsule Take 1 capsule (100 mg total) by mouth daily. 30 capsule 5   No current facility-administered medications on file prior to visit.    ALLERGIES: Allergies  Allergen Reactions   Tramadol Nausea And Vomiting   Trazodone And Nefazodone Other (See Comments)    Per pt trazodone caused hallucinations and behavior changes     FAMILY HISTORY: Family History  Problem Relation Age of Onset   Diabetes Mother    Heart disease Mother    Kidney disease Mother    Thyroid disease Mother    Heart disease Father    Seizures Father    COPD Father    Irritable bowel syndrome Father    Cancer Maternal Grandmother    Diabetes Sister     Diabetes Brother    Colon cancer Neg Hx    Stomach cancer Neg Hx    Colon polyps Neg Hx    Esophageal cancer Neg Hx    Rectal cancer Neg Hx       Objective:  Blood pressure (!) 111/53, pulse (!) 107, height _0  (1.575 m), weight 172 lb 12.8 oz (78.4 kg), SpO2 99 %. General: No acute distress.  Patient appears well-groomed.   Head:  Normocephalic/atraumatic Eyes:  Fundi examined but not visualized Neck: supple, no paraspinal tenderness, full  range of motion Heart:  Regular rate and rhythm Lungs:  Clear to auscultation bilaterally Back: No paraspinal tenderness Neurological Exam: alert and oriented to person, place, and time.  Speech fluent and not dysarthric, language intact.  CN II-XII intact. Bulk and tone normal, muscle strength 5/5 throughout.  Sensation to light touch intact.  Deep tendon reflexes 2+ throughout, toes downgoing.  Finger to nose testing intact.  Gait normal, Romberg negative.   Metta Clines, DO  CC: Jacolyn Reedy, NP

## 2021-03-30 ENCOUNTER — Ambulatory Visit (INDEPENDENT_AMBULATORY_CARE_PROVIDER_SITE_OTHER): Payer: Medicare Other | Admitting: Neurology

## 2021-03-30 ENCOUNTER — Other Ambulatory Visit: Payer: Self-pay

## 2021-03-30 ENCOUNTER — Encounter: Payer: Self-pay | Admitting: Neurology

## 2021-03-30 VITALS — BP 111/53 | HR 107 | Ht 62.0 in | Wt 172.8 lb

## 2021-03-30 DIAGNOSIS — I2583 Coronary atherosclerosis due to lipid rich plaque: Secondary | ICD-10-CM | POA: Diagnosis not present

## 2021-03-30 DIAGNOSIS — G43709 Chronic migraine without aura, not intractable, without status migrainosus: Secondary | ICD-10-CM | POA: Diagnosis not present

## 2021-03-30 DIAGNOSIS — G43009 Migraine without aura, not intractable, without status migrainosus: Secondary | ICD-10-CM

## 2021-03-30 DIAGNOSIS — I2510 Atherosclerotic heart disease of native coronary artery without angina pectoris: Secondary | ICD-10-CM

## 2021-03-30 MED ORDER — ZONISAMIDE 100 MG PO CAPS
100.0000 mg | ORAL_CAPSULE | Freq: Every day | ORAL | 5 refills | Status: DC
Start: 2021-03-30 — End: 2021-10-29

## 2021-03-30 MED ORDER — ZONISAMIDE 100 MG PO CAPS: 100.0000 mg | ORAL_CAPSULE | Freq: Every day | ORAL | 5 refills | Status: DC

## 2021-03-30 MED ORDER — UBRELVY 100 MG PO TABS: 1.0000 | ORAL_TABLET | ORAL | 5 refills | Status: DC | PRN

## 2021-03-30 NOTE — Patient Instructions (Signed)
Zonismaide 100mg  daily Ubrelvy 100mg  sent in Rizatriptan 10mg  as needed.  Limit use of pain relievers to no more than 2 days out of week to prevent risk of rebound or medication-overuse headache. Follow up in a year.

## 2021-04-06 ENCOUNTER — Telehealth: Payer: Self-pay | Admitting: Neurology

## 2021-04-06 NOTE — Telephone Encounter (Signed)
Pt would like to speak with sheena about a refill on her Ubrelvy. Wants to know whats going on with this refill

## 2021-04-07 NOTE — Telephone Encounter (Signed)
LMOVM pt may need a PA done before her pharmacy will allow for the medication to be mailed to her.   Will send a message to Marlowe Kays to start a PA. Pt may stop by to get samples if she likes  Our hours for the office left as well we will close early Wednesday be close on Thursday/ Friday for the holiday.

## 2021-04-13 DIAGNOSIS — Z23 Encounter for immunization: Secondary | ICD-10-CM | POA: Diagnosis not present

## 2021-04-16 DIAGNOSIS — H524 Presbyopia: Secondary | ICD-10-CM | POA: Diagnosis not present

## 2021-04-16 DIAGNOSIS — Z961 Presence of intraocular lens: Secondary | ICD-10-CM | POA: Diagnosis not present

## 2021-04-16 DIAGNOSIS — E119 Type 2 diabetes mellitus without complications: Secondary | ICD-10-CM | POA: Diagnosis not present

## 2021-04-28 ENCOUNTER — Telehealth: Payer: Self-pay

## 2021-04-28 NOTE — Telephone Encounter (Signed)
New message   Your information has been sent to [employer] (Key: Haskel Schroeder) Aimovig 70MG /ML auto-injectors   Form [employer] Electronic PA Form Created 3 days ago Sent to Plan 4 minutes ago Plan Response 4 minutes ago Submit Clinical Questions 1 minute ago Determination Wait for Determination Please wait for [facility] NCPDP 2017 to return a determination.

## 2021-04-29 ENCOUNTER — Telehealth: Payer: Self-pay

## 2021-04-29 NOTE — Telephone Encounter (Signed)
F/u  Jennifer Davidson Key: [id number] - PA Case ID: [id number] Need help? Call us at [phone] Outcome Approvedtoday PA Case: [id number], Status: Approved, Coverage Starts on: 05/17/2020 12:00:00 AM, Coverage Ends on: 05/16/2022 12:00:00 AM. Questions? Contact [phone]. Drug Bernita Raisin 100MG  tablets Form Electronic PA Form

## 2021-04-29 NOTE — Telephone Encounter (Signed)
F/u   Orie Rout (Key: Haskel Schroeder) Aimovig 70MG /ML auto-injectors   Form [employer] Electronic PA Form Created 4 days ago Sent to Plan 23 hours ago Plan Response 23 hours ago Submit Clinical Questions 23 hours ago Determination Favorable 23 hours ago Message from Plan PA Case: , Status: Approved, Coverage Starts on: 05/17/2020 12:00:00 AM, Coverage Ends on: 05/16/2022 12:00:00 AM. Questions? Contact [phone].

## 2021-04-29 NOTE — Telephone Encounter (Signed)
New message   This request has been approved.  Please note any additional information provided by [facility] at the bottom of your screen.  Marilee Kruer KeyHewitt Blade - PA Case ID: [id number] Need help? Call us at [phone] Outcome Approvedtoday PA Case: [id number], Status: Approved, Coverage Starts on: 05/17/2020 12:00:00 AM, Coverage Ends on: 05/16/2022 12:00:00 AM. Questions? Contact [phone]. Drug Bernita Raisin 100MG  tablets Form Electronic PA Form

## 2021-05-26 ENCOUNTER — Ambulatory Visit (INDEPENDENT_AMBULATORY_CARE_PROVIDER_SITE_OTHER): Payer: Medicare Other | Admitting: Nurse Practitioner

## 2021-05-26 ENCOUNTER — Encounter (HOSPITAL_BASED_OUTPATIENT_CLINIC_OR_DEPARTMENT_OTHER): Payer: Self-pay | Admitting: Nurse Practitioner

## 2021-05-26 ENCOUNTER — Other Ambulatory Visit: Payer: Self-pay

## 2021-05-26 VITALS — BP 118/68 | HR 62 | Ht 62.0 in | Wt 172.0 lb

## 2021-05-26 DIAGNOSIS — G43709 Chronic migraine without aura, not intractable, without status migrainosus: Secondary | ICD-10-CM

## 2021-05-26 DIAGNOSIS — E038 Other specified hypothyroidism: Secondary | ICD-10-CM | POA: Diagnosis not present

## 2021-05-26 DIAGNOSIS — Z9989 Dependence on other enabling machines and devices: Secondary | ICD-10-CM

## 2021-05-26 DIAGNOSIS — F39 Unspecified mood [affective] disorder: Secondary | ICD-10-CM | POA: Diagnosis not present

## 2021-05-26 DIAGNOSIS — R062 Wheezing: Secondary | ICD-10-CM

## 2021-05-26 DIAGNOSIS — R202 Paresthesia of skin: Secondary | ICD-10-CM | POA: Diagnosis not present

## 2021-05-26 DIAGNOSIS — F5105 Insomnia due to other mental disorder: Secondary | ICD-10-CM

## 2021-05-26 DIAGNOSIS — R7303 Prediabetes: Secondary | ICD-10-CM

## 2021-05-26 DIAGNOSIS — K130 Diseases of lips: Secondary | ICD-10-CM | POA: Diagnosis not present

## 2021-05-26 DIAGNOSIS — G5791 Unspecified mononeuropathy of right lower limb: Secondary | ICD-10-CM

## 2021-05-26 DIAGNOSIS — F411 Generalized anxiety disorder: Secondary | ICD-10-CM

## 2021-05-26 DIAGNOSIS — F4310 Post-traumatic stress disorder, unspecified: Secondary | ICD-10-CM

## 2021-05-26 DIAGNOSIS — N182 Chronic kidney disease, stage 2 (mild): Secondary | ICD-10-CM

## 2021-05-26 DIAGNOSIS — G4733 Obstructive sleep apnea (adult) (pediatric): Secondary | ICD-10-CM

## 2021-05-26 DIAGNOSIS — F99 Mental disorder, not otherwise specified: Secondary | ICD-10-CM

## 2021-05-26 DIAGNOSIS — F339 Major depressive disorder, recurrent, unspecified: Secondary | ICD-10-CM | POA: Diagnosis not present

## 2021-05-26 MED ORDER — AMBULATORY NON FORMULARY MEDICATION: 0 refills | Status: AC

## 2021-05-26 MED ORDER — LEVOTHYROXINE SODIUM 50 MCG PO TABS: 50.0000 ug | ORAL_TABLET | ORAL | 1 refills | Status: DC

## 2021-05-26 MED ORDER — PRAZOSIN HCL 5 MG PO CAPS: 10.0000 mg | ORAL_CAPSULE | Freq: Every day | ORAL | 0 refills | Status: DC

## 2021-05-26 MED ORDER — ALBUTEROL SULFATE HFA 108 (90 BASE) MCG/ACT IN AERS: INHALATION_SPRAY | RESPIRATORY_TRACT | 6 refills | Status: DC

## 2021-05-26 MED ORDER — LEVOTHYROXINE SODIUM 75 MCG PO TABS: 75.0000 ug | ORAL_TABLET | ORAL | 1 refills | Status: DC

## 2021-05-26 MED ORDER — HYDROXYZINE PAMOATE 50 MG PO CAPS: 50.0000 mg | ORAL_CAPSULE | Freq: Four times a day (QID) | ORAL | 2 refills | Status: DC | PRN

## 2021-05-26 MED ORDER — GABAPENTIN 600 MG PO TABS: ORAL_TABLET | ORAL | 3 refills | Status: DC

## 2021-05-26 NOTE — Progress Notes (Signed)
Established Patient Office Visit  Subjective:  Patient ID: Jennifer Davidson, female    DOB: March 25, 1956  Age: 66 y.o. MRN: [id number]  CC:  Chief Complaint  Patient presents with   Follow-up    HPI ONDRIA OSWALD presents for continued management of depression, hypothyroidism, and pre-diabetes.   Depression She has a significant history of depression, anxiety, and posttraumatic stress disorder. At her most recent visit she was experiencing worsening symptoms due to kidney concerns that it caused a postponement of her surgery.  She was experiencing quite a bit of pain at that time. She was able to have her surgery performed in late October. Today she tells me her symptoms of depression are doing very well. After the surgery she feels her mood improved significantly. She does endorse some difficulty sleeping recently.  She reports that she will lay in bed with racing thoughts and unable to fall asleep until hours later. She used to fall asleep quite well with her medications but this is a change over the past several months.  She is currently taking buspirone 15 mg 3 times a day, Cymbalta 60 mg daily, bupropion 150 mg/day, quetiapine 300 mg at bedtime, and hydroxyzine 50 mg up to every 6 hours to help control her symptoms.  Hypothyroidism She has a longstanding history of hypothyroidism. Her last labs were approximately 6 months ago and found to be within the normal range. She has not had any medication changes recently but did not undergo surgery in late October of last year. She is currently taking levothyroxine alternating days with 50 mcg and 75 mcg. She denies any concerning symptoms of fullness in throat, weight gain/loss, palpitations, or worsening depression/anxiety.   Pre-Diabetes She has a history of elevated blood sugar levels in the prediabetic range. Her most recent labs in September showed that she had managed to lower these levels to normal range. She is currently taking  metformin 500 mg twice a day with meals. She has no concerning symptoms of diarrhea, increased hunger, increased thirst, or increased weight.   Past Medical History:  Diagnosis Date   Acute cystitis without hematuria    Acute encephalopathy 06/25/2016   Altered mental status 06/23/2017   Anxiety    Anxiety and depression 02/04/2012   Arthritis    Cataract 08/31/2017   left eye   CHF (congestive heart failure) ([place]) 2017   resolved   Cholesterol serum increased    Chronic back pain    chronic Rt low back pain. s/p L4-5 fusion. failed Rt facet injections. poss due to Rt SI joint dysfunction.   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   Depression    Diabetes mellitus without complication ([place])    Diastolic heart failure ([place]) 03/17/2012   Edema 02/04/2012   Encounter for long-term (current) use of medications 02/04/2012   Fall as cause of accidental injury at home as place of occurrence 09/08/2018   GERD (gastroesophageal reflux disease) 11/13/2015   Hallucination, visual 09/14/2017   Head injury 09/08/2018   Hyperlipidemia    Hyperparathyroidism ([place])    Hyperparathyroidism, secondary renal ([place]) 07/31/2014   [facility] [name], MD  Plan:  25 Hydroxy Vit D (59.1 ng/ml)    Hypothyroidism    Moderate episode of recurrent major depressive disorder ([place]) 08/31/2016   PONV (postoperative nausea  and vomiting)    Seizures ([place])    3 years ago,2018   Sepsis ([place]) 04/29/2020   Sleep apnea    cpap   Syncope 08/30/2015   Syncope and collapse 02/12/2012   Tachycardia 02/04/2012   Trochanteric bursitis of both hips 2012   Confirmed on MRI    Past Surgical History:  Procedure Laterality Date   [place]     L4-5 fusion   CATARACT  EXTRACTION     KNEE SURGERY     REVERSE SHOULDER ARTHROPLASTY Left 07/03/2020   Procedure: REVERSE SHOULDER ARTHROPLASTY;  Surgeon: [name], MD;  Location: [facility];  Service: Orthopedics;  Laterality: Left;   REVERSE SHOULDER ARTHROPLASTY Right 03/05/2021   Procedure: REVERSE SHOULDER ARTHROPLASTY;  Surgeon: [name], MD;  Location: [facility];  Service: Orthopedics;  Laterality: Right;   SHOULDER ARTHROSCOPY WITH SUBACROMIAL DECOMPRESSION Left 10/20/2018   Procedure: SHOULDER ARTHROSCOPY, ROTATOR CUFF DEBRIDEMENT, GLENOHUMERAL JOINT DEBRIDEMENT, ACROMIOPLASTY, DISTAL CLAVICLE RESECTION;  Surgeon: [name], MD;  Location: [facility];  Service: Orthopedics;  Laterality: Left;    Family History  Problem Relation Age of Onset   Diabetes Mother    Heart disease Mother    Kidney disease Mother    Thyroid disease Mother    Heart disease Father    Seizures Father    COPD Father    Irritable bowel syndrome Father    Cancer Maternal Grandmother    Diabetes Sister    Diabetes Brother    Colon cancer Neg Hx    Stomach cancer Neg Hx    Colon polyps Neg Hx    Esophageal cancer Neg Hx    Rectal cancer Neg Hx     Social History   Socioeconomic History   Marital status: Married    Spouse name: Doctor, general practice   Number of children: 3   Years of education: 12   Highest education level: Some college, no degree  Occupational History   Occupation: Retired  Tobacco Use   Smoking status: Never   Smokeless tobacco: Never  Vaping Use   Vaping Use: Never used  Substance and Sexual Activity   Alcohol use: Never   Drug use: Never    Comment: 08-31-2016 PER PT NO    Sexual activity: Yes    Partners: Male    Comment: married  Other Topics Concern   Not on file  Social History Narrative   Right handed   Lives with husband one story   Three adult children that live close by. Close relationship with grandchildren    Social Determinants of Health   Financial Resource Strain: Low Risk     Difficulty of Paying Living Expenses: Not hard at all  Food Insecurity: No Food Insecurity   Worried About Charity fundraiser in the Last Year: Never true   Arboriculturist in the Last Year: Never true  Transportation Needs: No Transportation Needs   Lack of Transportation (Medical): No   Lack of Transportation (Non-Medical): No  Physical Activity: Insufficiently Active   Days of Exercise per Week: 4 days   Minutes of Exercise per Session: 10 min  Stress: Stress Concern Present   Feeling of Stress : Rather much  Social Connections: Moderately Isolated   Frequency of Communication with Friends and Family: More than three times a week   Frequency of Social Gatherings with Friends and Family: More than three times a week   Attends  Religious Services: Never   Active Member of Clubs or Organizations: No   Attends Archivist Meetings: Never   Marital Status: Married  Human resources officer Violence: Not At Risk   Fear of Current or Ex-Partner: No   Emotionally Abused: No   Physically Abused: No   Sexually Abused: No    Outpatient Medications Prior to Visit  Medication Sig Dispense Refill   aspirin EC 81 MG tablet Take 1 tablet (81 mg total) by mouth daily. 90 tablet 3   blood glucose meter kit and supplies KIT Dispense based on patient and insurance preference. Use up to four times daily as directed. Check blood sugar three times weekly, then as needed. 1 each 0   buPROPion (WELLBUTRIN XL) 150 MG 24 hr tablet Take 1 tablet (150 mg total) by mouth daily. 90 tablet 3   busPIRone (BUSPAR) 7.5 MG tablet Take 2 tablets (15 mg total) by mouth 3 (three) times daily. 540 tablet 1   cetirizine (ZYRTEC) 10 MG tablet Take 1 tablet (10 mg total) by mouth daily. 90 tablet 3   diclofenac sodium (VOLTAREN) 1 % GEL Apply 2 g topically daily as needed (for knee pain). 100 g 3   DULoxetine (CYMBALTA) 60 MG capsule Take 1 capsule (60 mg total) by mouth daily. 90 capsule 3   famotidine (PEPCID) 20 MG  tablet Take 1 tablet (20 mg total) by mouth daily as needed for heartburn or indigestion. (Patient taking differently: Take 20 mg by mouth daily.) 90 tablet 3   fluticasone (FLONASE) 50 MCG/ACT nasal spray PLACE 2 SPRAYS AT BEDTIME INTO BOTH NOSTRILS. (Patient taking differently: Place 2 sprays into both nostrils daily as needed for allergies.) 16 g 5   meclizine (ANTIVERT) 50 MG tablet Take 0.5 tablets (25 mg total) by mouth 2 (two) times daily as needed for dizziness. (Patient taking differently: Take 50 mg by mouth 2 (two) times daily as needed for dizziness.) 60 tablet 3   metFORMIN (GLUCOPHAGE) 500 MG tablet Take 1 tablet (500 mg total) by mouth 2 (two) times daily with a meal. 180 tablet 3   Multiple Vitamins-Minerals (MULTIVITAMIN WITH MINERALS) tablet Take 1 tablet by mouth daily.     Polyvinyl Alcohol-Povidone PF (REFRESH) 1.4-0.6 % SOLN Place 1 drop into both eyes daily as needed (dry eyes).     Probiotic Product (ALIGN) 4 MG CAPS Take 1 capsule (4 mg total) by mouth daily. 30 capsule 0   QUEtiapine (SEROQUEL) 300 MG tablet Take 1 tablet (300 mg total) by mouth at bedtime. TAKE 1 TABLET BY MOUTH EVERYDAY AT BEDTIME 90 tablet 3   rizatriptan (MAXALT-MLT) 10 MG disintegrating tablet Take 1 tablet (10 mg total) by mouth as needed for migraine. May repeat in 2 hours if needed 36 tablet 3   trolamine salicylate (ASPERCREME) 10 % cream Apply 1 application topically as needed for muscle pain.     Ubrogepant (UBRELVY) 100 MG TABS Take 1 tablet by mouth as needed (May repeat in 2 years.  Maximum 2 tablets in 24 hours.). 10 tablet 5   zonisamide (ZONEGRAN) 100 MG capsule Take 1 capsule (100 mg total) by mouth daily. 30 capsule 5   albuterol (VENTOLIN HFA) 108 (90 Base) MCG/ACT inhaler TAKE 2 PUFFS BY MOUTH EVERY 6 HOURS AS NEEDED FOR WHEEZE OR SHORTNESS OF BREATH 18 g 6   gabapentin (NEURONTIN) 600 MG tablet Take 1 tablet (600 mg total) by mouth 3 (three) times daily. (Patient taking differently: Take  600 mg by  mouth 2 (two) times daily.) 270 tablet 3   hydrOXYzine (ATARAX/VISTARIL) 50 MG tablet Take 1 tablet (50 mg total) by mouth every 6 (six) hours as needed. 360 tablet 2   levothyroxine (SYNTHROID) 50 MCG tablet Take 1 tablet (50 mcg total) by mouth every other day. Before breakfast. Alternate with 66mg tablet 90 tablet 1   levothyroxine (SYNTHROID) 75 MCG tablet Take 1 tablet (75 mcg total) by mouth every other day. Before breakfast. Alternate with 516m tablet 90 tablet 1   oxyCODONE-acetaminophen (PERCOCET) 5-325 MG tablet Take 1 tablet by mouth every 4 (four) hours as needed for severe pain. (Patient not taking: Reported on 03/30/2021) 30 tablet 0   prazosin (MINIPRESS) 5 MG capsule TAKE 2-3 CAPSULES (10-15 MG TOTAL) BY MOUTH AT BEDTIME. TAKE 2 TO 3 CAPSULES AT BEDTIME 270 capsule 0   promethazine (PHENERGAN) 25 MG suppository Place 1 suppository (25 mg total) rectally every 6 (six) hours as needed for nausea. (Patient not taking: No sig reported) 12 each 0   tiZANidine (ZANAFLEX) 2 MG tablet Take 1 tablet (2 mg total) by mouth every 8 (eight) hours as needed for muscle spasms. (Patient not taking: Reported on 03/30/2021) 20 tablet 0   No facility-administered medications prior to visit.    Allergies  Allergen Reactions   Tramadol Nausea And Vomiting   Trazodone And Nefazodone Other (See Comments)    Per pt trazodone caused hallucinations and behavior changes     ROS Review of Systems All review of systems negative except what is listed in the HPI    Objective:    Physical Exam Vitals and nursing note reviewed.  Constitutional:      Appearance: Normal appearance.  HENT:     Head: Normocephalic.  Eyes:     Extraocular Movements: Extraocular movements intact.     Conjunctiva/sclera: Conjunctivae normal.     Pupils: Pupils are equal, round, and reactive to light.  Neck:     Vascular: No carotid bruit.  Cardiovascular:     Rate and Rhythm: Normal rate and regular  rhythm.     Pulses: Normal pulses.     Heart sounds: Normal heart sounds.  Pulmonary:     Effort: Pulmonary effort is normal.     Breath sounds: Normal breath sounds.  Musculoskeletal:     Cervical back: Normal range of motion and neck supple. No tenderness.     Right lower leg: No edema.     Left lower leg: No edema.  Lymphadenopathy:     Cervical: No cervical adenopathy.  Skin:    General: Skin is warm and dry.     Capillary Refill: Capillary refill takes less than 2 seconds.  Neurological:     General: No focal deficit present.     Mental Status: She is alert and oriented to person, place, and time.  Psychiatric:        Mood and Affect: Mood normal.        Behavior: Behavior normal.        Thought Content: Thought content normal.        Judgment: Judgment normal.    BP 118/68    Pulse 62    Ht _0  (1.575 m)    Wt 172 lb (78 kg)    BMI 31.46 kg/m  Wt Readings from Last 3 Encounters:  05/26/21 172 lb (78 kg)  03/30/21 172 lb 12.8 oz (78.4 kg)  03/05/21 183 lb (83 kg)     Health Maintenance Due  Topic Date Due   Zoster Vaccines- Shingrix (1 of 2) Never done   TETANUS/TDAP  05/17/2018   Pneumonia Vaccine 38+ Years old (4 - PPSV23 if available, else PCV20) 02/19/2020   COVID-19 Vaccine (4 - Booster for Moderna series) 07/01/2020    There are no preventive care reminders to display for this patient.  Lab Results  Component Value Date   TSH 1.800 11/04/2020   Lab Results  Component Value Date   WBC 8.1 03/03/2021   HGB 12.1 03/03/2021   HCT 39.0 03/03/2021   MCV 84.2 03/03/2021   PLT 240 03/03/2021   Lab Results  Component Value Date   NA 136 03/03/2021   K 3.9 03/03/2021   CO2 22 03/03/2021   GLUCOSE 104 (H) 03/03/2021   BUN 20 03/03/2021   CREATININE 1.25 (H) 03/03/2021   BILITOT 0.4 01/13/2021   ALKPHOS 65 01/13/2021   AST 17 01/13/2021   ALT 14 01/13/2021   PROT 6.8 01/13/2021   ALBUMIN 3.9 01/13/2021   CALCIUM 8.7 (L) 03/03/2021   ANIONGAP 7  03/03/2021   EGFR 42 (L) 11/04/2020   Lab Results  Component Value Date   CHOL 171 07/24/2020   Lab Results  Component Value Date   HDL 60 07/24/2020   Lab Results  Component Value Date   LDLCALC 80 07/24/2020   Lab Results  Component Value Date   TRIG 153 (H) 07/24/2020   Lab Results  Component Value Date   CHOLHDL 2.9 07/24/2020   Lab Results  Component Value Date   HGBA1C 5.5 02/02/2021      Assessment & Plan:   Problem List Items Addressed This Visit     Chronic kidney disease (Chronic)    Will re-evaluate labs today. Recent start on sodium tabs while hospitalized for shoulder surgery - will evaluate sodium levels today to ensure that appropriate doses are given       Relevant Orders   Hemoglobin A1c   Hypothyroid (Chronic)    Chronic. No new symptoms today. Continue medications as directed.  Will plan for TSH labs at next visit as she has been stable for the past several checks with no new or concerning symptoms.       Relevant Medications   levothyroxine (SYNTHROID) 50 MCG tablet   levothyroxine (SYNTHROID) 75 MCG tablet   Major depression, recurrent, chronic (HCC) (Chronic)    Stable at this time.  No medication changes.  Will continue to monitor.       Relevant Medications   hydrOXYzine (VISTARIL) 50 MG capsule   Mood disorder (HCC)    Stable- continue to monitor.       Headache, migraine    Recent change to Ubrelvy from [place]. Patient reports medication was effective, however, she was told insurance would not cover.  Chart review shows PA accepted on medication recently. Instructed patient to contact neurology to inquire about refills now that medication is accepted.       Relevant Medications   gabapentin (NEURONTIN) 600 MG tablet   prazosin (MINIPRESS) 5 MG capsule   Neuropathy of lower extremity    Plan to change gabapentin to $RemoveBefor'600mg'[id number]$  in the morning and increase nightly dose to 900 mg to help with improved control and sleep.  Will  monitor      Relevant Medications   gabapentin (NEURONTIN) 600 MG tablet   hydrOXYzine (VISTARIL) 50 MG capsule   Generalized anxiety disorder    Stable- continue to monitor Gabapentin change to increase night time  dose to 942m and cut out afternoon dose to help with rest.       Relevant Medications   hydrOXYzine (VISTARIL) 50 MG capsule   PTSD (post-traumatic stress disorder)    Stable- no medication change today.       Relevant Medications   hydrOXYzine (VISTARIL) 50 MG capsule   Prediabetes - Primary    Chronic Will obtain labs today for evaluation. Continue metformin 500 mg with meals twice a day.  We will make changes to plan of care based on lab results as necessary.      Relevant Orders   CBC with Differential/Platelet   Comprehensive metabolic panel   Hemoglobin A1c   Other Visit Diagnoses     Insomnia due to other mental disorder       Relevant Medications   prazosin (MINIPRESS) 5 MG capsule   Wheezing       Relevant Medications   albuterol (VENTOLIN HFA) 108 (90 Base) MCG/ACT inhaler   Angular cheilitis       Relevant Orders   B12 and Folate Panel   Paresthesia of skin       Relevant Orders   B12 and Folate Panel   OSA on CPAP       Relevant Medications   AMBULATORY NON FORMULARY MEDICATION       Meds ordered this encounter  Medications   gabapentin (NEURONTIN) 600 MG tablet    Sig: Take 1 tablet (600 mg total) by mouth every morning AND 1.5 tablets (900 mg total) at bedtime.    Dispense:  225 tablet    Refill:  3   prazosin (MINIPRESS) 5 MG capsule    Sig: Take 2-3 capsules (10-15 mg total) by mouth at bedtime. TAKE 2 TO 3 CAPSULES AT BEDTIME    Dispense:  270 capsule    Refill:  0   levothyroxine (SYNTHROID) 50 MCG tablet    Sig: Take 1 tablet (50 mcg total) by mouth every other day. Before breakfast. Alternate with 719m tablet    Dispense:  90 tablet    Refill:  1   levothyroxine (SYNTHROID) 75 MCG tablet    Sig: Take 1 tablet (75 mcg  total) by mouth every other day. Before breakfast. Alternate with 5024mtablet    Dispense:  90 tablet    Refill:  1   albuterol (VENTOLIN HFA) 108 (90 Base) MCG/ACT inhaler    Sig: TAKE 2 PUFFS BY MOUTH EVERY 6 HOURS AS NEEDED FOR WHEEZE OR SHORTNESS OF BREATH    Dispense:  18 g    Refill:  6   AMBULATORY NON FORMULARY MEDICATION    Sig: Continuous positive airway pressure (CPAP) machine set at 15 cm of H2O pressure, with all supplemental supplies as needed.    Dispense:  1 each    Refill:  0   hydrOXYzine (VISTARIL) 50 MG capsule    Sig: Take 1 capsule (50 mg total) by mouth every 6 (six) hours as needed.    Dispense:  360 capsule    Refill:  2    Follow-up: Return in about 6 months (around 11/23/2021) for Depression, Thyroid, Pre-DM.    SarOrma RenderP

## 2021-05-26 NOTE — Assessment & Plan Note (Signed)
Will re-evaluate labs today. Recent start on sodium tabs while hospitalized for shoulder surgery - will evaluate sodium levels today to ensure that appropriate doses are given

## 2021-05-26 NOTE — Assessment & Plan Note (Signed)
Stable at this time.  No medication changes.  Will continue to monitor.

## 2021-05-26 NOTE — Assessment & Plan Note (Addendum)
Chronic. No new symptoms today. Continue medications as directed.  Will plan for TSH labs at next visit as she has been stable for the past several checks with no new or concerning symptoms.

## 2021-05-26 NOTE — Patient Instructions (Addendum)
It was great to see you today! We will plan to get labs today and I will be in touch with you if we need to make any changes based on these results. We will plan to follow-up in approximately 6 months or sooner if needed.  Please feel free to call the office at any time if you feel your depression or anxiety begins to worsen or you need to speak about any other concerns.  I will let you know about your sodium levels, continue to take the medication until I let you know.

## 2021-05-26 NOTE — Assessment & Plan Note (Signed)
Plan to change gabapentin to 600mg  in the morning and increase nightly dose to 900 mg to help with improved control and sleep.  Will monitor

## 2021-05-26 NOTE — Assessment & Plan Note (Signed)
Stable- no medication change today.

## 2021-05-26 NOTE — Assessment & Plan Note (Addendum)
>>  ASSESSMENT AND PLAN FOR MOOD DISORDER (HCC) WRITTEN ON 09/21/2023  5:42 PM BY Dan Scearce E, NP   >>ASSESSMENT AND PLAN FOR MOOD DISORDER (HCC) WRITTEN ON 05/26/2021 12:37 PM BY Shakur Lembo E, NP  Stable- continue to monitor.    >>ASSESSMENT AND PLAN FOR MAJOR DEPRESSION, RECURRENT, CHRONIC (HCC) WRITTEN ON 05/26/2021 12:37 PM BY Erling Arrazola E, NP  Stable at this time.  No medication changes.  Will continue to monitor.    >>ASSESSMENT AND PLAN FOR GENERALIZED ANXIETY DISORDER WRITTEN ON 05/26/2021 12:38 PM BY Lorrie Strauch E, NP  Stable- continue to monitor Gabapentin  change to increase night time dose to 900mg  and cut out afternoon dose to help with rest.

## 2021-05-26 NOTE — Assessment & Plan Note (Signed)
Chronic Will obtain labs today for evaluation. Continue metformin 500 mg with meals twice a day.  We will make changes to plan of care based on lab results as necessary.

## 2021-05-26 NOTE — Assessment & Plan Note (Signed)
Stable- continue to monitor Gabapentin change to increase night time dose to 900mg  and cut out afternoon dose to help with rest.

## 2021-05-26 NOTE — Assessment & Plan Note (Signed)
>>  ASSESSMENT AND PLAN FOR CHRONIC KIDNEY DISEASE WRITTEN ON 05/26/2021 12:37 PM BY Ibtisam Benge E, NP  Will re-evaluate labs today. Recent start on sodium tabs while hospitalized for shoulder surgery - will evaluate sodium levels today to ensure that appropriate doses are given

## 2021-05-26 NOTE — Assessment & Plan Note (Signed)
Recent change to Ubrelvy from Aimovig. Patient reports medication was effective, however, she was told insurance would not cover.  Chart review shows PA accepted on medication recently. Instructed patient to contact neurology to inquire about refills now that medication is accepted.

## 2021-05-27 LAB — COMPREHENSIVE METABOLIC PANEL
ALT: 12 IU/L (ref 0–32)
AST: 11 IU/L (ref 0–40)
Albumin/Globulin Ratio: 1.6 (ref 1.2–2.2)
Albumin: 3.9 g/dL (ref 3.8–4.8)
Alkaline Phosphatase: 80 IU/L (ref 44–121)
BUN/Creatinine Ratio: 12 (ref 12–28)
BUN: 15 mg/dL (ref 8–27)
Bilirubin Total: <0.2 mg/dL (ref 0.0–1.2)
CO2: 21 mmol/L (ref 20–29)
Calcium: 9.8 mg/dL (ref 8.7–10.3)
Chloride: 99 mmol/L (ref 96–106)
Creatinine, Ser: 1.3 mg/dL — ABNORMAL HIGH (ref 0.57–1.00)
Globulin, Total: 2.5 g/dL (ref 1.5–4.5)
Glucose: 103 mg/dL — ABNORMAL HIGH (ref 70–99)
Potassium: 4.9 mmol/L (ref 3.5–5.2)
Sodium: 135 mmol/L (ref 134–144)
Total Protein: 6.4 g/dL (ref 6.0–8.5)
eGFR: 45 mL/min/{1.73_m2} — ABNORMAL LOW (ref >59)

## 2021-05-27 LAB — HEMOGLOBIN A1C
Est. average glucose Bld gHb Est-mCnc: 108 mg/dL
Hgb A1c MFr Bld: 5.4 % (ref 4.8–5.6)

## 2021-05-27 LAB — CBC WITH DIFFERENTIAL/PLATELET
Basophils Absolute: 0.1 10*3/uL (ref 0.0–0.2)
Basos: 0 %
EOS (ABSOLUTE): 0.2 10*3/uL (ref 0.0–0.4)
Eos: 2 %
Hematocrit: 33.8 % — ABNORMAL LOW (ref 34.0–46.6)
Hemoglobin: 11 g/dL — ABNORMAL LOW (ref 11.1–15.9)
Immature Grans (Abs): 0.2 10*3/uL — ABNORMAL HIGH (ref 0.0–0.1)
Immature Granulocytes: 2 %
Lymphocytes Absolute: 3 10*3/uL (ref 0.7–3.1)
Lymphs: 26 %
MCH: 27 pg (ref 26.6–33.0)
MCHC: 32.5 g/dL (ref 31.5–35.7)
MCV: 83 fL (ref 79–97)
Monocytes Absolute: 1 10*3/uL — ABNORMAL HIGH (ref 0.1–0.9)
Monocytes: 9 %
Neutrophils Absolute: 7.1 10*3/uL — ABNORMAL HIGH (ref 1.4–7.0)
Neutrophils: 61 %
Platelets: 306 10*3/uL (ref 150–450)
RBC: 4.08 x10E6/uL (ref 3.77–5.28)
RDW: 16.4 % — ABNORMAL HIGH (ref 11.7–15.4)
WBC: 11.5 10*3/uL — ABNORMAL HIGH (ref 3.4–10.8)

## 2021-05-27 LAB — B12 AND FOLATE PANEL
Folate: >20 ng/mL (ref >3.0)
Vitamin B-12: 679 pg/mL (ref 232–1245)

## 2021-05-28 ENCOUNTER — Encounter (HOSPITAL_BASED_OUTPATIENT_CLINIC_OR_DEPARTMENT_OTHER): Payer: Self-pay | Admitting: Nurse Practitioner

## 2021-05-28 DIAGNOSIS — G43709 Chronic migraine without aura, not intractable, without status migrainosus: Secondary | ICD-10-CM

## 2021-06-01 ENCOUNTER — Other Ambulatory Visit (HOSPITAL_BASED_OUTPATIENT_CLINIC_OR_DEPARTMENT_OTHER): Payer: Self-pay | Admitting: Nurse Practitioner

## 2021-06-01 DIAGNOSIS — G4733 Obstructive sleep apnea (adult) (pediatric): Secondary | ICD-10-CM

## 2021-06-01 DIAGNOSIS — Z9989 Dependence on other enabling machines and devices: Secondary | ICD-10-CM

## 2021-06-01 MED ORDER — AMBULATORY NON FORMULARY MEDICATION: 0 refills | Status: AC

## 2021-06-18 MED ORDER — RIZATRIPTAN BENZOATE 10 MG PO TBDP: 10.0000 mg | ORAL_TABLET | ORAL | 3 refills | Status: DC | PRN

## 2021-07-06 ENCOUNTER — Telehealth: Payer: Medicare Other | Admitting: Physician Assistant

## 2021-07-06 NOTE — Progress Notes (Signed)
Error. Patient wanting to discuss with PCP

## 2021-07-08 ENCOUNTER — Telehealth (HOSPITAL_BASED_OUTPATIENT_CLINIC_OR_DEPARTMENT_OTHER): Payer: Self-pay

## 2021-07-08 ENCOUNTER — Telehealth (INDEPENDENT_AMBULATORY_CARE_PROVIDER_SITE_OTHER): Payer: Medicare Other | Admitting: Nurse Practitioner

## 2021-07-08 ENCOUNTER — Encounter (HOSPITAL_BASED_OUTPATIENT_CLINIC_OR_DEPARTMENT_OTHER): Payer: Self-pay | Admitting: Nurse Practitioner

## 2021-07-08 DIAGNOSIS — F339 Major depressive disorder, recurrent, unspecified: Secondary | ICD-10-CM | POA: Diagnosis not present

## 2021-07-08 DIAGNOSIS — F515 Nightmare disorder: Secondary | ICD-10-CM

## 2021-07-08 DIAGNOSIS — R110 Nausea: Secondary | ICD-10-CM | POA: Diagnosis not present

## 2021-07-08 DIAGNOSIS — F39 Unspecified mood [affective] disorder: Secondary | ICD-10-CM | POA: Diagnosis not present

## 2021-07-08 DIAGNOSIS — G4733 Obstructive sleep apnea (adult) (pediatric): Secondary | ICD-10-CM

## 2021-07-08 DIAGNOSIS — K219 Gastro-esophageal reflux disease without esophagitis: Secondary | ICD-10-CM | POA: Diagnosis not present

## 2021-07-08 DIAGNOSIS — R112 Nausea with vomiting, unspecified: Secondary | ICD-10-CM

## 2021-07-08 DIAGNOSIS — F411 Generalized anxiety disorder: Secondary | ICD-10-CM | POA: Diagnosis not present

## 2021-07-08 DIAGNOSIS — F331 Major depressive disorder, recurrent, moderate: Secondary | ICD-10-CM | POA: Diagnosis not present

## 2021-07-08 DIAGNOSIS — R7303 Prediabetes: Secondary | ICD-10-CM | POA: Diagnosis not present

## 2021-07-08 DIAGNOSIS — F4310 Post-traumatic stress disorder, unspecified: Secondary | ICD-10-CM | POA: Diagnosis not present

## 2021-07-08 DIAGNOSIS — T7840XD Allergy, unspecified, subsequent encounter: Secondary | ICD-10-CM

## 2021-07-08 HISTORY — DX: Nausea with vomiting, unspecified: R11.2

## 2021-07-08 MED ORDER — PANTOPRAZOLE SODIUM 40 MG PO TBEC: 40.0000 mg | DELAYED_RELEASE_TABLET | Freq: Two times a day (BID) | ORAL | 1 refills | Status: DC

## 2021-07-08 MED ORDER — CETIRIZINE HCL 10 MG PO TABS: 10.0000 mg | ORAL_TABLET | Freq: Every day | ORAL | 3 refills | Status: DC

## 2021-07-08 MED ORDER — QUETIAPINE FUMARATE 300 MG PO TABS: 300.0000 mg | ORAL_TABLET | Freq: Every day | ORAL | 3 refills | Status: DC

## 2021-07-08 MED ORDER — METFORMIN HCL 500 MG PO TABS: 500.0000 mg | ORAL_TABLET | Freq: Two times a day (BID) | ORAL | 3 refills | Status: DC

## 2021-07-08 MED ORDER — ONDANSETRON HCL 8 MG PO TABS: 8.0000 mg | ORAL_TABLET | Freq: Three times a day (TID) | ORAL | 2 refills | Status: DC | PRN

## 2021-07-08 NOTE — Progress Notes (Signed)
Virtual Visit Encounter mychart visit.   I connected with  Jennifer Davidson on 07/08/21 at  8:10 AM EST by secure audio and/or video enabled telemedicine application. I verified that I am speaking with the correct person using two identifiers.   I introduced myself as a Designer, jewellery with the practice. The limitations of evaluation and management by telemedicine discussed with the patient and the availability of in person appointments. The patient expressed verbal understanding and consent to proceed.  Participating parties in this visit include: Myself and patient  The patient is: Patient Location: Home I am: Provider Location: Office/Clinic Subjective:    CC and HPI: Jennifer Davidson is a 66 y.o. year old female presenting for heartburn and acid reflux. She reports approximately 3-4 weeks of worsening heartburn with severe symptoms including nausea and vomiting. She can't think of any changes to diet or activity that have happened since it has started. She is having burning in her chest with relfux of stomach contents on a daily basis. She has a long history of GERD, but has been controlling this with Nexium 40mg  every morning and PRN famotidine. This is not helping at this time. She has no abdominal pain, blood in emesis, epigastric tenderness, fevers, chills, or weakness.   She is also having difficulty getting her CPAP machine approved by insurance. She spoke with insurance recently and they told her she needs a PSG, proof of user benefit, and "face to face". She reports her last sleep study was in 2011 and done as a home study. She is not sure of the name of the doctor who did this, she does not think he is in practice any longer. Her current machine is outdated and she is unable to get new parts so a new machine is vital for her to continue with her CPAP. Insurance fax number is [phone].   She also needs refills on some of her chronic medications today as the previous refills are sent  through [facility] and she must use [facility] now with insurance.   Past medical history, Surgical history, Family history not pertinant except as noted below, Social history, Allergies, and medications have been entered into the medical record, reviewed, and corrections made.   Review of Systems:  All review of systems negative except what is listed in the HPI  Objective:    Alert and oriented x 4 Speaking in clear sentences with no shortness of breath. No distress.  Impression and Recommendations:    Problem List Items Addressed This Visit     Major depression, recurrent, chronic (HCC) (Chronic)   Gastroesophageal reflux disease - Primary    Exacerbation of GERD symptoms with no alarm symptoms at this time. Etiology unknown. She is not taking any NSAIDS due to kidney function.  We will change Nexium to Protonix 40mg  twice a day dosing for 30 days. She may continue famotidine PRN if she has breakthrough symptoms. Will also send zofran for nausea PRN.  Discussed with patient if she has not changes in her symptoms in the next week to 2 weeks to let me know. At that point we may need to consider GI evaluation.  Recommend sleep with head of bed elevated, avoid large amounts of caffeine and carbonated beverages, avoid spicy foods and large meals.       OSA (obstructive sleep apnea)    Chronic. On CPAP- machine outdated and unable to get replacement parts.  New machine ordered, but insurance requiring PSA, Face to Face evaluation, and  Proof of User Benefit for approval.  Chart review does not reveal previous study results. Will plan to contact insurance company for further clarification. If new study is needed, will send referral to Dr. Tomi Likens as she is established with him and hopefully he can get a new study completed in a timely fashion for her or make recommendations on where she can go for a new study.       Nausea   Mood disorder (HCC)   Generalized anxiety disorder   PTSD (post-traumatic  stress disorder)   Prediabetes   Allergies   Dream anxiety disorder   Other Visit Diagnoses     Moderate episode of recurrent major depressive disorder ([place])           orders and follow up as documented in EMR I discussed the assessment and treatment plan with the patient. The patient was provided an opportunity to ask questions and all were answered. The patient agreed with the plan and demonstrated an understanding of the instructions.   The patient was advised to call back or seek an in-person evaluation if the symptoms worsen or if the condition fails to improve as anticipated.  Follow-Up: If no improvement in one week.   I provided 22 minutes of non-face-to-face interaction with this non face-to-face encounter including intake, same-day documentation, and chart review.   Orma Render, NP , DNP, AGNP-c [place] [facility] [phone] (fax)

## 2021-07-08 NOTE — Assessment & Plan Note (Signed)
Chronic. On CPAP- machine outdated and unable to get replacement parts.  New machine ordered, but insurance requiring PSA, Face to Face evaluation, and Proof of User Benefit for approval.  Chart review does not reveal previous study results. Will plan to contact insurance company for further clarification. If new study is needed, will send referral to Dr. Everlena Cooper as she is established with him and hopefully he can get a new study completed in a timely fashion for her or make recommendations on where she can go for a new study.

## 2021-07-08 NOTE — Assessment & Plan Note (Signed)
Exacerbation of GERD symptoms with no alarm symptoms at this time. Etiology unknown. She is not taking any NSAIDS due to kidney function.  We will change Nexium to Protonix 40mg  twice a day dosing for 30 days. She may continue famotidine PRN if she has breakthrough symptoms. Will also send zofran for nausea PRN.  Discussed with patient if she has not changes in her symptoms in the next week to 2 weeks to let me know. At that point we may need to consider GI evaluation.  Recommend sleep with head of bed elevated, avoid large amounts of caffeine and carbonated beverages, avoid spicy foods and large meals.

## 2021-07-14 NOTE — Progress Notes (Signed)
07/16/21- 65 yoF never smoker for sleep evaluation courtesy of Jacolyn Reedy, NP with concern of OSA Medical problem list includes CHF, Migraine, HTN, OSA, GERD, DM, Hyperparathyroid, Hypothyroid, Dream Anxiety Disorder, Peripheral Neuropathy,  CKD, Anxiety, Depression, PTSD, Hyperlipidemia,  No sleep study found CPAP 15/ Body weight today-170 lbs Epworth score 8 Covid vax-4 Moderna Flu vax-had Has seen Psychiatry/ Neurology for depression, PTSD, ?seizure,  Had sleep study 10 or 15 years ago, ordered by an Administrator, Civil Service in the [place] area.  Has been using CPAP ever since.  Machine is now very old.  If it slips off she snores loudly.  Has been getting supplies online with no CPAP follow-up. Takes prazosin at bedtime to help sleep.  Significant problem with nightmares in the past is somewhat better now.  I mentioned availability of prescription application NightWare, since she has a smart watch and iPhone.  She can talk to her behavioral health providers about this.  Has adjustable bed.  Husband usually sleeps in a separate bedroom because her restlessness disturbs his sleep.  No complex parasomnias described. She denies ENT surgery, heart or lung problems.  I did not explore her history of CHF. She indicates she has lost about 43 pounds in the last couple of years.  Prior to Admission medications   Medication Sig Start Date End Date Taking? Authorizing Provider  albuterol (VENTOLIN HFA) 108 (90 Base) MCG/ACT inhaler TAKE 2 PUFFS BY MOUTH EVERY 6 HOURS AS NEEDED FOR WHEEZE OR SHORTNESS OF BREATH 05/26/21  Yes Early, Coralee Pesa, NP  AMBULATORY NON FORMULARY MEDICATION Continuous positive airway pressure (CPAP) machine set at 15 cm of H2O pressure, with all supplemental supplies as needed. 05/26/21  Yes Early, Coralee Pesa, NP  AMBULATORY NON FORMULARY MEDICATION Continuous positive airway pressure (CPAP) machine set at 15 cm of H2O pressure. Supplemental supplies: Headgear with q 6 months, Full face mask q 3 months, Full  face cushion q 3 months, 1 standard tubing q 3 months, 1 water chamber q 6 months. 1 reusable filter q 6 months, 6 disposable filters q 3 months. Mask F-29. 06/01/21  Yes Early, Coralee Pesa, NP  aspirin EC 81 MG tablet Take 1 tablet (81 mg total) by mouth daily. 07/04/19  Yes Sueanne Margarita, MD  atorvastatin (LIPITOR) 20 MG tablet  03/27/21  Yes [provider]  blood glucose meter kit and supplies KIT Dispense based on patient and insurance preference. Use up to four times daily as directed. Check blood sugar three times weekly, then as needed. 07/28/20  Yes Early, Coralee Pesa, NP  buPROPion (WELLBUTRIN XL) 150 MG 24 hr tablet Take 1 tablet (150 mg total) by mouth daily. 11/05/20  Yes Early, Coralee Pesa, NP  busPIRone (BUSPAR) 7.5 MG tablet Take 2 tablets (15 mg total) by mouth 3 (three) times daily. 02/20/21  Yes Early, Coralee Pesa, NP  cetirizine (ZYRTEC) 10 MG tablet Take 1 tablet (10 mg total) by mouth daily. 07/08/21  Yes Early, Coralee Pesa, NP  diclofenac sodium (VOLTAREN) 1 % GEL Apply 2 g topically daily as needed (for knee pain). 08/23/18  Yes Jacelyn Pi, Lilia Argue, MD  DULoxetine (CYMBALTA) 60 MG capsule Take 1 capsule (60 mg total) by mouth daily. 11/05/20  Yes Early, Coralee Pesa, NP  famotidine (PEPCID) 20 MG tablet Take 1 tablet (20 mg total) by mouth daily as needed for heartburn or indigestion. Patient taking differently: Take 20 mg by mouth daily. 11/05/20  Yes Early, Coralee Pesa, NP  fluticasone (FLONASE) 50 MCG/ACT  nasal spray PLACE 2 SPRAYS AT BEDTIME INTO BOTH NOSTRILS. Patient taking differently: Place 2 sprays into both nostrils daily as needed for allergies. 05/06/20  Yes Just, Laurita Quint, FNP  gabapentin (NEURONTIN) 600 MG tablet Take 1 tablet (600 mg total) by mouth every morning AND 1.5 tablets (900 mg total) at bedtime. 05/26/21  Yes Early, Coralee Pesa, NP  hydrOXYzine (VISTARIL) 50 MG capsule Take 1 capsule (50 mg total) by mouth every 6 (six) hours as needed. 05/26/21  Yes Early, Coralee Pesa, NP  levothyroxine  (SYNTHROID) 50 MCG tablet Take 1 tablet (50 mcg total) by mouth every other day. Before breakfast. Alternate with 34mg tablet 05/26/21  Yes Early, SCoralee Pesa NP  levothyroxine (SYNTHROID) 75 MCG tablet Take 1 tablet (75 mcg total) by mouth every other day. Before breakfast. Alternate with 566m tablet 05/26/21  Yes Early, SaCoralee PesaNP  meclizine (ANTIVERT) 50 MG tablet Take 0.5 tablets (25 mg total) by mouth 2 (two) times daily as needed for dizziness. Patient taking differently: Take 50 mg by mouth 2 (two) times daily as needed for dizziness. 05/21/20  Yes Just, KeLaurita QuintFNP  metFORMIN (GLUCOPHAGE) 500 MG tablet Take 1 tablet (500 mg total) by mouth 2 (two) times daily with a meal. 07/08/21  Yes Early, SaCoralee PesaNP  Multiple Vitamins-Minerals (MULTIVITAMIN WITH MINERALS) tablet Take 1 tablet by mouth daily.   Yes [provider]  ondansetron (ZOFRAN) 8 MG tablet Take 1 tablet (8 mg total) by mouth every 8 (eight) hours as needed for nausea or vomiting. 07/08/21  Yes Early, SaCoralee PesaNP  pantoprazole (PROTONIX) 40 MG tablet Take 1 tablet (40 mg total) by mouth 2 (two) times daily. 07/08/21  Yes Early, SaCoralee PesaNP  Polyvinyl Alcohol-Povidone PF (REFRESH) 1.4-0.6 % SOLN Place 1 drop into both eyes daily as needed (dry eyes).   Yes [provider]  prazosin (MINIPRESS) 5 MG capsule Take 2-3 capsules (10-15 mg total) by mouth at bedtime. TAKE 2 TO 3 CAPSULES AT BEDTIME 05/26/21  Yes Early, SaCoralee PesaNP  Probiotic Product (ALIGN) 4 MG CAPS Take 1 capsule (4 mg total) by mouth daily. 04/17/20  Yes HaIsla PenceMD  QUEtiapine (SEROQUEL) 300 MG tablet Take 1 tablet (300 mg total) by mouth at bedtime. TAKE 1 TABLET BY MOUTH EVERYDAY AT BEDTIME 07/08/21  Yes Early, SaCoralee PesaNP  rizatriptan (MAXALT-MLT) 10 MG disintegrating tablet Take 1 tablet (10 mg total) by mouth as needed for migraine. May repeat in 2 hours if needed 06/18/21  Yes Early, SaCoralee PesaNP  trolamine salicylate (ASPERCREME) 10 % cream Apply 1  application topically as needed for muscle pain.   Yes [provider]  Ubrogepant (UBRELVY) 100 MG TABS Take 1 tablet by mouth as needed (May repeat in 2 years.  Maximum 2 tablets in 24 hours.). 03/30/21  Yes Jaffe, Adam R, DO  zonisamide (ZONEGRAN) 100 MG capsule Take 1 capsule (100 mg total) by mouth daily. 03/30/21  Yes JaPieter PartridgeDO   Past Medical History:  Diagnosis Date   Acute cystitis without hematuria    Acute encephalopathy 06/25/2016   Altered mental status 06/23/2017   Anxiety    Anxiety and depression 02/04/2012   Arthritis    Cataract 08/31/2017   left eye   CHF (congestive heart failure) (HCAdrian2017   resolved   Cholesterol serum increased    Chronic back pain    chronic Rt low back pain. s/p L4-5 fusion. failed Rt facet  injections. poss due to Rt SI joint dysfunction.   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   Depression    Diabetes mellitus without complication ([place])    Diastolic heart failure ([place]) 03/17/2012   Edema 02/04/2012   Encounter for long-term (current) use of medications 02/04/2012   Fall as cause of accidental injury at home as place of occurrence 09/08/2018   Gastroenteritis 01/13/2021   GERD (gastroesophageal reflux disease) 11/13/2015   Hallucination, visual 09/14/2017   Head injury 09/08/2018   Hyperlipidemia    Hyperparathyroidism ([place])    Hyperparathyroidism, secondary renal ([place]) 07/31/2014   [facility] [name], MD  Plan:  25 Hydroxy Vit D (59.1 ng/ml)    Hypothyroidism    Moderate episode of recurrent major depressive disorder ([place]) 08/31/2016   PONV (postoperative nausea and vomiting)    Seizures ([place])    3 years ago,2018   Sepsis ([place]) 04/29/2020   Sleep apnea    cpap   Syncope 08/30/2015   Syncope and collapse 02/12/2012    Tachycardia 02/04/2012   Trochanteric bursitis of both hips 2012   Confirmed on MRI   Past Surgical History:  Procedure Laterality Date   ABDOMINAL HYSTERECTOMY     APPENDECTOMY  1986   BACK SURGERY     L4-5 fusion   CATARACT EXTRACTION     KNEE SURGERY     REVERSE SHOULDER ARTHROPLASTY Left 07/03/2020   Procedure: REVERSE SHOULDER ARTHROPLASTY;  Surgeon: [name], MD;  Location: [facility];  Service: Orthopedics;  Laterality: Left;   REVERSE SHOULDER ARTHROPLASTY Right 03/05/2021   Procedure: REVERSE SHOULDER ARTHROPLASTY;  Surgeon: [name], MD;  Location: [facility];  Service: Orthopedics;  Laterality: Right;   SHOULDER ARTHROSCOPY WITH SUBACROMIAL DECOMPRESSION Left 10/20/2018   Procedure: SHOULDER ARTHROSCOPY, ROTATOR CUFF DEBRIDEMENT, GLENOHUMERAL JOINT DEBRIDEMENT, ACROMIOPLASTY, DISTAL CLAVICLE RESECTION;  Surgeon: [name], MD;  Location: [facility];  Service: Orthopedics;  Laterality: Left;   Family History  Problem Relation Age of Onset   Diabetes Mother    Heart disease Mother    Kidney disease Mother    Thyroid disease Mother    Heart disease Father    Seizures Father    COPD Father    Irritable bowel syndrome Father    Cancer Maternal Grandmother    Diabetes Sister    Diabetes Brother    Colon cancer Neg Hx    Stomach cancer Neg Hx    Colon polyps Neg Hx    Esophageal cancer Neg Hx    Rectal cancer Neg Hx    Social History   Socioeconomic History   Marital status: Married    Spouse name: Doctor, general practice   Number of children: 3   Years of education: 12   Highest education level: Some college, no degree  Occupational History   Occupation: Retired  Tobacco Use   Smoking status: Never   Smokeless tobacco: Never  Vaping Use   Vaping Use: Never used  Substance and Sexual Activity   Alcohol use: Never   Drug use: Never    Comment: 08-31-2016 PER PT NO    Sexual activity: Yes    Partners: Male    Comment: married  Other Topics Concern   Not on file  Social  History Narrative   Right handed   Lives with husband one story  Three adult children that live close by. Close relationship with grandchildren    Social Determinants of Health   Financial Resource Strain: Low Risk    Difficulty of Paying Living Expenses: Not hard at all  Food Insecurity: No Food Insecurity   Worried About Charity fundraiser in the Last Year: Never true   Arboriculturist in the Last Year: Never true  Transportation Needs: No Transportation Needs   Lack of Transportation (Medical): No   Lack of Transportation (Non-Medical): No  Physical Activity: Insufficiently Active   Days of Exercise per Week: 4 days   Minutes of Exercise per Session: 10 min  Stress: Stress Concern Present   Feeling of Stress : Rather much  Social Connections: Moderately Isolated   Frequency of Communication with Friends and Family: More than three times a week   Frequency of Social Gatherings with Friends and Family: More than three times a week   Attends Religious Services: Never   Marine scientist or Organizations: No   Attends Music therapist: Never   Marital Status: Married  Human resources officer Violence: Not At Risk   Fear of Current or Ex-Partner: No   Emotionally Abused: No   Physically Abused: No   Sexually Abused: No   ROS-see HPI   + = positive Constitutional:    +weight loss, night sweats, fevers, chills, fatigue, lassitude. HEENT:    headaches, difficulty swallowing, tooth/dental problems, sore throat,       sneezing, itching, ear ache, nasal congestion, post nasal drip, snoring CV:    chest pain, orthopnea, PND, swelling in lower extremities, anasarca,     dizziness, palpitations Resp:   shortness of breath with exertion or at rest.                productive cough,   non-productive cough, coughing up of blood.              change in color of mucus.  wheezing.   Skin:    rash or lesions. GI:  No-   heartburn, indigestion, abdominal pain, nausea, vomiting,  diarrhea,                 change in bowel habits, loss of appetite GU: dysuria, change in color of urine, no urgency or frequency.   flank pain. MS:   joint pain, stiffness, decreased range of motion, back pain. Neuro-     nothing unusual Psych:  change in mood or affect.  depression or anxiety.   memory loss. OBJ- Physical Exam General- Alert, Oriented, Affect-appropriate, Distress- none acute Skin- rash-none, lesions- none, excoriation- none Lymphadenopathy- none Head- atraumatic            Eyes- Gross vision intact, PERRLA, conjunctivae and secretions clear            Ears- Hearing, canals-normal            Nose- Clear, no-Septal dev, mucus, polyps, erosion, perforation             Throat- Mallampati II I-IV, mucosa clear , drainage- none, tonsils- atrophic, +teeth Neck- flexible , trachea midline, no stridor , thyroid nl, carotid no bruit Chest - symmetrical excursion , unlabored           Heart/CV- RRR , no murmur , no gallop  , no rub, nl s1 s2                           -  JVD- none , edema- none, stasis changes- none, varices- none           Lung- clear to P&A, wheeze- none, cough- none , dullness-none, rub- none           Chest wall-  Abd-  Br/ Gen/ Rectal- Not done, not indicated Extrem- cyanosis- none, clubbing, none, atrophy- none, strength- nl Neuro- grossly intact to observation

## 2021-07-16 ENCOUNTER — Ambulatory Visit (INDEPENDENT_AMBULATORY_CARE_PROVIDER_SITE_OTHER): Payer: Medicare Other | Admitting: Internal Medicine

## 2021-07-16 ENCOUNTER — Encounter: Payer: Self-pay | Admitting: Internal Medicine

## 2021-07-16 ENCOUNTER — Other Ambulatory Visit: Payer: Self-pay

## 2021-07-16 VITALS — BP 128/70 | HR 116 | Temp 97.7°F | Ht 62.0 in | Wt 170.8 lb

## 2021-07-16 DIAGNOSIS — G4733 Obstructive sleep apnea (adult) (pediatric): Secondary | ICD-10-CM

## 2021-07-16 DIAGNOSIS — F515 Nightmare disorder: Secondary | ICD-10-CM

## 2021-07-16 NOTE — Assessment & Plan Note (Signed)
Remote diagnosis with no current documentation.  Has benefited from CPAP getting supplies online.  We will need to requalify her to get a new machine. ?Plan-schedule home sleep test.  Anticipate we will be able to establish a new DME source for replacement CPAP likely changing to [employer] 5-20 initially. ?

## 2021-07-16 NOTE — Patient Instructions (Signed)
Order- schedule HST   dx OSA ? ?There is a prescription app for use with iPhone/ Apple smart watch called NightWare that is FDA approved as a way to help with nightmares. You might want to look into it. ? ?Please call us about 2 weeks after your sleep study for results and recommendations. ?

## 2021-07-16 NOTE — Assessment & Plan Note (Signed)
Since she has an Oncologist, I suggested she might talk with her behavioral health provider about "NightWare" application. ?

## 2021-07-31 ENCOUNTER — Other Ambulatory Visit (HOSPITAL_BASED_OUTPATIENT_CLINIC_OR_DEPARTMENT_OTHER): Payer: Self-pay | Admitting: Nurse Practitioner

## 2021-07-31 DIAGNOSIS — K219 Gastro-esophageal reflux disease without esophagitis: Secondary | ICD-10-CM

## 2021-08-01 ENCOUNTER — Encounter (HOSPITAL_BASED_OUTPATIENT_CLINIC_OR_DEPARTMENT_OTHER): Payer: Self-pay | Admitting: Nurse Practitioner

## 2021-08-02 ENCOUNTER — Encounter (HOSPITAL_BASED_OUTPATIENT_CLINIC_OR_DEPARTMENT_OTHER): Payer: Self-pay | Admitting: Nurse Practitioner

## 2021-08-04 ENCOUNTER — Other Ambulatory Visit (HOSPITAL_BASED_OUTPATIENT_CLINIC_OR_DEPARTMENT_OTHER): Payer: Self-pay

## 2021-08-04 DIAGNOSIS — F515 Nightmare disorder: Secondary | ICD-10-CM

## 2021-08-04 DIAGNOSIS — F39 Unspecified mood [affective] disorder: Secondary | ICD-10-CM

## 2021-08-04 DIAGNOSIS — G5791 Unspecified mononeuropathy of right lower limb: Secondary | ICD-10-CM

## 2021-08-04 DIAGNOSIS — F339 Major depressive disorder, recurrent, unspecified: Secondary | ICD-10-CM

## 2021-08-04 DIAGNOSIS — F331 Major depressive disorder, recurrent, moderate: Secondary | ICD-10-CM

## 2021-08-04 DIAGNOSIS — F411 Generalized anxiety disorder: Secondary | ICD-10-CM

## 2021-08-04 DIAGNOSIS — F4310 Post-traumatic stress disorder, unspecified: Secondary | ICD-10-CM

## 2021-08-04 MED ORDER — DULOXETINE HCL 60 MG PO CPEP
60.0000 mg | ORAL_CAPSULE | Freq: Every day | ORAL | 3 refills | Status: DC
Start: 2021-08-04 — End: 2022-07-23

## 2021-08-04 MED ORDER — BUPROPION HCL ER (XL) 150 MG PO TB24: 150.0000 mg | ORAL_TABLET | Freq: Every day | ORAL | 3 refills | Status: DC

## 2021-08-12 NOTE — Progress Notes (Signed)
?Cardiology Office Note:   ? ?Date:  08/13/2021  ? ?ID:  Jennifer Davidson, DOB Jan 09, 1956, MRN [id number] ? ?PCP:  Orma Render, NP ?  ?[facility] Providers ?Cardiologist:  Fransico Him, MD    ? ?Referring MD: Orma Render, NP  ? ?Chief Complaint: one year follow-up chronic HFpEF, hypertension ? ?History of Present Illness:   ? ?Jennifer Davidson is a 66 y.o. female with a hx of chronic HFpEF, hypertension, hypothyroid, OSA on PAP, GERD, dizziness, and syncope.  ? ?She had an episode of syncope several years ago standing in her driveway talking to her husband.  She was taken to the ED and work-up was normal.  Echocardiogram at that time was normal.  In December 2020, she was having episodes of dizziness different from syncopal event.  She was feeling like she lost control walking across her living room, no lightheadedness no presyncope.  Echo 05/2019 revealed normal LVEF, grade 2 diastolic dysfunction.  Event monitor showed PACs and PVCs.  Coronary CTA 06/2019 showed mild CAD of the mid LAD 25 to 49% and small pericardial effusion.  Calcium score was 216, 89th percentile for age and sex matched control. ? ?During hospitalization in December 2021, she experienced some atypical chest pain and chest wall tenderness.  EKG was normal and troponins were negative x2.  And outpatient follow-up, she described a sharp pain in upper chest for a fleeting moment occurring both with and without exertion accompanied by dyspnea on exertion but no diaphoresis.  Symptoms of dizziness were felt to be vertigo.  Lexiscan Myoview was normal without ischemia or infarction, LVEF > 65%, low risk study. Echo revealed LVEF 60-65%, no rwma, mild concentric LVH, G1 DD, normal RV, trivial valve regurgitation, no pericardial effusion. ? ?She was last seen via telemedicine on 06/26/2020 by Dr. Radford Pax.  She continued to describe episodes of sharp pains in her chest felt to be coming from her shoulder.  She was planning for shoulder replacement surgery  within the next few weeks. No changes were made to her treatment plan and one year follow-up was recommended.  ? ?Today, she is here alone for follow-up.  She reports she is feeling well.  Recently saw ENT for dizziness and was recommended to take meclizine which she reports is helping.  She walks 1 mile almost every morning, and occasionally notices shortness of breath.  Has had times of dehydration but recently more aware and is drinking water and Powerade.  Has a history of hyponatremia that is currently stable. Is limiting high sodium foods, doing most of the cooking at home.  ?Has to repeat sleep study to get cpap and this is being schedule. She denies chest pain, lower extremity edema, fatigue, palpitations, melena, hematuria, hemoptysis, diaphoresis, weakness, presyncope, syncope, orthopnea, and PND. ? ? ?Past Medical History:  ?Diagnosis Date  ? Acute cystitis without hematuria   ? Acute encephalopathy 06/25/2016  ? Altered mental status 06/23/2017  ? Anxiety   ? Anxiety and depression 02/04/2012  ? Arthritis   ? Cataract 08/31/2017  ? left eye  ? CHF (congestive heart failure) ([place]) 2017  ? resolved  ? Cholesterol serum increased   ? Chronic back pain   ? chronic Rt low back pain. s/p L4-5 fusion. failed Rt facet injections. poss due to Rt SI joint dysfunction.  ? Chronic kidney disease   ? Chronic pain of right lower extremity 05/14/2015  ? Community acquired pneumonia of left lower lobe of lung 09/25/2018  ?  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 Depression   ? Diabetes mellitus without complication ([place])   ? Diastolic heart failure ([place]) 03/17/2012  ? Edema 02/04/2012  ? Encounter for long-term (current) use of medications 02/04/2012  ? Fall as cause of accidental injury at home as place of occurrence 09/08/2018  ? Gastroenteritis 01/13/2021  ? GERD (gastroesophageal reflux disease) 11/13/2015  ? Hallucination, visual 09/14/2017  ? Head injury 09/08/2018  ? Hyperlipidemia   ?  Hyperparathyroidism ([place])   ? Hyperparathyroidism, secondary renal ([place]) 07/31/2014  ? [place], MD  Plan:  25 Hydroxy Vit D (59.1 ng/ml)   ? Hypothyroidism   ? Moderate episode of recurrent major depressive disorder ([place]) 08/31/2016  ? PONV (postoperative nausea and vomiting)   ? Seizures ([place])   ? 3 years ago,2018  ? Sepsis ([place]) 04/29/2020  ? Sleep apnea   ? cpap  ? Syncope 08/30/2015  ? Syncope and collapse 02/12/2012  ? Tachycardia 02/04/2012  ? Trochanteric bursitis of both hips 2012  ? Confirmed on MRI  ? ? ?Past Surgical History:  ?Procedure Laterality Date  ? ABDOMINAL HYSTERECTOMY    ? APPENDECTOMY  1986  ? BACK SURGERY    ? L4-5 fusion  ? CATARACT EXTRACTION    ? KNEE SURGERY    ? REVERSE SHOULDER ARTHROPLASTY Left 07/03/2020  ? Procedure: REVERSE SHOULDER ARTHROPLASTY;  Surgeon: [name], MD;  Location: [facility];  Service: Orthopedics;  Laterality: Left;  ? REVERSE SHOULDER ARTHROPLASTY Right 03/05/2021  ? Procedure: REVERSE SHOULDER ARTHROPLASTY;  Surgeon: [name], MD;  Location: [facility];  Service: Orthopedics;  Laterality: Right;  ? SHOULDER ARTHROSCOPY WITH SUBACROMIAL DECOMPRESSION Left 10/20/2018  ? Procedure: SHOULDER ARTHROSCOPY, ROTATOR CUFF DEBRIDEMENT, GLENOHUMERAL JOINT DEBRIDEMENT, ACROMIOPLASTY, DISTAL CLAVICLE RESECTION;  Surgeon: [name], MD;  Location: [facility];  Service: Orthopedics;  Laterality: Left;  ? ? ?Current Medications: ?Current Meds  ?Medication Sig  ? albuterol (VENTOLIN HFA) 108 (90 Base) MCG/ACT inhaler TAKE 2 PUFFS BY MOUTH EVERY 6 HOURS AS NEEDED FOR WHEEZE OR SHORTNESS OF BREATH  ? AMBULATORY NON FORMULARY MEDICATION Continuous positive airway pressure (CPAP) machine set at 15 cm of H2O pressure, with all supplemental supplies as needed.  ? AMBULATORY NON FORMULARY MEDICATION Continuous positive airway pressure (CPAP) machine set at 15 cm of H2O pressure. Supplemental supplies: Headgear with q 6 months, Full face mask q  3 months, Full face cushion q 3 months, 1 standard tubing q 3 months, 1 water chamber q 6 months. 1 reusable filter q 6 months, 6 disposable filters q 3 months. Mask F-29.  ? aspirin EC 81 MG tablet Take 1 tablet (81 mg total) by mouth daily.  ? atorvastatin (LIPITOR) 20 MG tablet   ? blood glucose meter kit and supplies KIT Dispense based on patient and insurance preference. Use up to four times daily as directed. Check blood sugar three times weekly, then as needed.  ? buPROPion (WELLBUTRIN XL) 150 MG 24 hr tablet Take 1 tablet (150 mg total) by mouth daily.  ? busPIRone (BUSPAR) 7.5 MG tablet Take 2 tablets (15 mg total) by mouth 3 (three) times daily.  ? carvedilol (COREG) 3.125 MG tablet Take 1 tablet (3.125 mg total) by mouth 2 (two) times daily.  ? cetirizine (ZYRTEC) 10 MG tablet Take 1 tablet (10 mg total) by mouth daily.  ? diclofenac sodium (VOLTAREN) 1 % GEL Apply 2 g topically daily as needed (for knee pain).  ? DULoxetine (  CYMBALTA) 60 MG capsule Take 1 capsule (60 mg total) by mouth daily.  ? famotidine (PEPCID) 20 MG tablet Take 1 tablet (20 mg total) by mouth daily as needed for heartburn or indigestion.  ? fluticasone (FLONASE) 50 MCG/ACT nasal spray PLACE 2 SPRAYS AT BEDTIME INTO BOTH NOSTRILS.  ? gabapentin (NEURONTIN) 600 MG tablet Take 1 tablet (600 mg total) by mouth every morning AND 1.5 tablets (900 mg total) at bedtime.  ? hydrOXYzine (VISTARIL) 50 MG capsule Take 1 capsule (50 mg total) by mouth every 6 (six) hours as needed.  ? levothyroxine (SYNTHROID) 50 MCG tablet Take 1 tablet (50 mcg total) by mouth every other day. Before breakfast. Alternate with 58mg tablet  ? levothyroxine (SYNTHROID) 75 MCG tablet Take 1 tablet (75 mcg total) by mouth every other day. Before breakfast. Alternate with 519m tablet  ? meclizine (ANTIVERT) 50 MG tablet Take 0.5 tablets (25 mg total) by mouth 2 (two) times daily as needed for dizziness. (Patient taking differently: Take 50 mg by mouth 2 (two) times  daily as needed for dizziness.)  ? metFORMIN (GLUCOPHAGE) 500 MG tablet Take 1 tablet (500 mg total) by mouth 2 (two) times daily with a meal.  ? Multiple Vitamins-Minerals (MULTIVITAMIN WITH MINERALS) tablet TaRich Number

## 2021-08-13 ENCOUNTER — Ambulatory Visit (INDEPENDENT_AMBULATORY_CARE_PROVIDER_SITE_OTHER): Payer: Medicare Other | Admitting: Nurse Practitioner

## 2021-08-13 ENCOUNTER — Encounter: Payer: Self-pay | Admitting: Nurse Practitioner

## 2021-08-13 VITALS — BP 112/68 | HR 101 | Ht 62.0 in | Wt 170.6 lb

## 2021-08-13 DIAGNOSIS — E782 Mixed hyperlipidemia: Secondary | ICD-10-CM

## 2021-08-13 DIAGNOSIS — I5032 Chronic diastolic (congestive) heart failure: Secondary | ICD-10-CM

## 2021-08-13 DIAGNOSIS — R000 Tachycardia, unspecified: Secondary | ICD-10-CM

## 2021-08-13 DIAGNOSIS — R42 Dizziness and giddiness: Secondary | ICD-10-CM | POA: Diagnosis not present

## 2021-08-13 DIAGNOSIS — I2583 Coronary atherosclerosis due to lipid rich plaque: Secondary | ICD-10-CM

## 2021-08-13 DIAGNOSIS — I2510 Atherosclerotic heart disease of native coronary artery without angina pectoris: Secondary | ICD-10-CM | POA: Diagnosis not present

## 2021-08-13 DIAGNOSIS — R0602 Shortness of breath: Secondary | ICD-10-CM

## 2021-08-13 DIAGNOSIS — G4733 Obstructive sleep apnea (adult) (pediatric): Secondary | ICD-10-CM | POA: Diagnosis not present

## 2021-08-13 DIAGNOSIS — I10 Essential (primary) hypertension: Secondary | ICD-10-CM

## 2021-08-13 MED ORDER — CARVEDILOL 3.125 MG PO TABS: 3.1250 mg | ORAL_TABLET | Freq: Two times a day (BID) | ORAL | 3 refills | Status: DC

## 2021-08-13 NOTE — Patient Instructions (Signed)
Medication Instructions:  ? ?DECREASE Prazosin ( 5 mg ) X 3 days than discontinue. ? ?START  Coreg one ( 1 ) tablet by mouth ( 3.125 mg ) twice daily.  ? ?*If you need a refill on your cardiac medications before your next appointment, please call your pharmacy* ? ?Follow-Up: ?At [facility], you and your health needs are our priority.  As part of our continuing mission to provide you with exceptional heart care, we have created designated Provider Care Teams.  These Care Teams include your primary Cardiologist (physician) and Advanced Practice Providers (APPs -  Physician Assistants and Nurse Practitioners) who all work together to provide you with the care you need, when you need it. ? ?We recommend signing up for the patient portal called "MyChart".  Sign up information is provided on this After Visit Summary.  MyChart is used to connect with patients for Virtual Visits (Telemedicine).  Patients are able to view lab/test results, encounter notes, upcoming appointments, etc.  Non-urgent messages can be sent to your provider as well.   ?To learn more about what you can do with MyChart, go to [email or web address].   ? ?Your next appointment:   ?1 year(s) ? ?The format for your next appointment:   ?In Person ? ?Provider:   ?Fransico Him, MD   ? ? ?Other Instructions ? ? ?Please send in blood pressure readings and let us know if your blood pressure gets low and if you have any symptoms X 2 weeks.  ? ?Your physician wants you to follow-up in: 1 year with Dr.Turner.  You will receive a reminder letter in the mail two months in advance. If you don't receive a letter, please call our office to schedule the follow-up appointment. ? ?Mediterranean Diet ?A Mediterranean diet refers to food and lifestyle choices that are based on the traditions of countries located on the [employer]. It focuses on eating more fruits, vegetables, whole grains, beans, nuts, seeds, and heart-healthy fats, and eating less dairy, meat,  eggs, and processed foods with added sugar, salt, and fat. This way of eating has been shown to help prevent certain conditions and improve outcomes for people who have chronic diseases, like kidney disease and heart disease. ?What are tips for following this plan? ?Reading food labels ?Check the serving size of packaged foods. For foods such as rice and pasta, the serving size refers to the amount of cooked product, not dry. ?Check the total fat in packaged foods. Avoid foods that have saturated fat or trans fats. ?Check the ingredient list for added sugars, such as corn syrup. ?Shopping ? ?Buy a variety of foods that offer a balanced diet, including: ?Fresh fruits and vegetables (produce). ?Grains, beans, nuts, and seeds. Some of these may be available in unpackaged forms or large amounts (in bulk). ?Fresh seafood. ?Poultry and eggs. ?Low-fat dairy products. ?Buy whole ingredients instead of prepackaged foods. ?Buy fresh fruits and vegetables in-season from local farmers markets. ?Buy plain frozen fruits and vegetables. ?If you do not have access to quality fresh seafood, buy precooked frozen shrimp or canned fish, such as tuna, salmon, or sardines. ?Stock your pantry so you always have certain foods on hand, such as olive oil, canned tuna, canned tomatoes, rice, pasta, and beans. ?Cooking ?Cook foods with extra-virgin olive oil instead of using butter or other vegetable oils. ?Have meat as a side dish, and have vegetables or grains as your main dish. This means having meat in small portions or adding small amounts of  meat to foods like pasta or stew. ?Use beans or vegetables instead of meat in common dishes like chili or lasagna. ?Experiment with different cooking methods. Try roasting, broiling, steaming, and saut?ing vegetables. ?Add frozen vegetables to soups, stews, pasta, or rice. ?Add nuts or seeds for added healthy fats and plant protein at each meal. You can add these to yogurt, salads, or vegetable  dishes. ?Marinate fish or vegetables using olive oil, lemon juice, garlic, and fresh herbs. ?Meal planning ?Plan to eat one vegetarian meal one day each week. Try to work up to two vegetarian meals, if possible. ?Eat seafood two or more times a week. ?Have healthy snacks readily available, such as: ?Vegetable sticks with hummus. ?[place] yogurt. ?Fruit and nut trail mix. ?Eat balanced meals throughout the week. This includes: ?Fruit: 2-3 servings a day. ?Vegetables: 4-5 servings a day. ?Low-fat dairy: 2 servings a day. ?Fish, poultry, or lean meat: 1 serving a day. ?Beans and legumes: 2 or more servings a week. ?Nuts and seeds: 1-2 servings a day. ?Whole grains: 6-8 servings a day. ?Extra-virgin olive oil: 3-4 servings a day. ?Limit red meat and sweets to only a few servings a month. ?Lifestyle ? ?Cook and eat meals together with your family, when possible. ?Drink enough fluid to keep your urine pale yellow. ?Be physically active every day. This includes: ?Aerobic exercise like running or swimming. ?Leisure activities like gardening, walking, or housework. ?Get 7-8 hours of sleep each night. ?If recommended by your health care provider, drink red wine in moderation. This means 1 glass a day for nonpregnant women and 2 glasses a day for men. A glass of wine equals 5 oz (150 mL). ?What foods should I eat? ?Fruits ?Apples. Apricots. Avocado. Berries. Bananas. Cherries. Dates. Figs. Grapes. Lemons. Melon. Oranges. Peaches. Plums. Pomegranate. ?Vegetables ?Artichokes. Beets. Broccoli. Cabbage. Carrots. Eggplant. Green beans. Chard. Kale. Spinach. Onions. Leeks. Peas. Squash. Tomatoes. Peppers. Radishes. ?Grains ?Whole-grain pasta. Brown rice. Bulgur wheat. Polenta. Couscous. Whole-wheat bread. [place]. ?Meats and other proteins ?Beans. Almonds. Sunflower seeds. Pine nuts. Peanuts. [place]. Salmon. Scallops. Shrimp. [place]. Tilapia. Clams. Oysters. Eggs. Poultry without skin. ?Dairy ?Low-fat milk. Cheese. Greek  yogurt. ?Fats and oils ?Extra-virgin olive oil. Avocado oil. Grapeseed oil. ?Beverages ?Water. Red wine. Herbal tea. ?Sweets and desserts ?Greek yogurt with honey. Baked apples. Poached pears. Trail mix. ?Seasonings and condiments ?Basil. Cilantro. Coriander. Cumin. Mint. Parsley. Sage. Rosemary. Tarragon. Garlic. Oregano. Thyme. Pepper. Balsamic vinegar. Tahini. Hummus. Tomato sauce. Olives. Mushrooms. ?The items listed above may not be a complete list of foods and beverages you can eat. Contact a dietitian for more information. ?What foods should I limit? ?This is a list of foods that should be eaten rarely or only on special occasions. ?Fruits ?Fruit canned in syrup. ?Vegetables ?Deep-fried potatoes (french fries). ?Grains ?Prepackaged pasta or rice dishes. Prepackaged cereal with added sugar. Prepackaged snacks with added sugar. ?Meats and other proteins ?Beef. Pork. Lamb. Poultry with skin. Hot dogs. [place] Salines. ?Dairy ?Ice cream. Sour cream. Whole milk. ?Fats and oils ?Butter. Canola oil. Vegetable oil. Beef fat (tallow). Lard. ?Beverages ?Juice. Sugar-sweetened soft drinks. Beer. Liquor and spirits. ?Sweets and desserts ?Cookies. Cakes. Pies. Candy. ?Seasonings and condiments ?Mayonnaise. Pre-made sauces and marinades. ?The items listed above may not be a complete list of foods and beverages you should limit. Contact a dietitian for more information. ?Summary ?The Mediterranean diet includes both food and lifestyle choices. ?Eat a variety of fresh fruits and vegetables, beans, nuts, seeds, and whole grains. ?Limit the amount of  red meat and sweets that you eat. ?If recommended by your health care provider, drink red wine in moderation. This means 1 glass a day for nonpregnant women and 2 glasses a day for men. A glass of wine equals 5 oz (150 mL). ?This information is not intended to replace advice given to you by your health care provider. Make sure you discuss any questions you have with your health care  provider. ?Document Revised: 06/08/2019 Document Reviewed: 04/05/2019 ?Elsevier Patient Education ? 2022 [place]. ? ?  ?

## 2021-08-20 ENCOUNTER — Ambulatory Visit (INDEPENDENT_AMBULATORY_CARE_PROVIDER_SITE_OTHER): Payer: Medicare Other | Admitting: Nurse Practitioner

## 2021-08-20 ENCOUNTER — Encounter (HOSPITAL_BASED_OUTPATIENT_CLINIC_OR_DEPARTMENT_OTHER): Payer: Self-pay | Admitting: Nurse Practitioner

## 2021-08-20 VITALS — BP 117/72 | HR 86 | Temp 98.7°F | Ht 62.0 in | Wt 169.6 lb

## 2021-08-20 DIAGNOSIS — B370 Candidal stomatitis: Secondary | ICD-10-CM | POA: Diagnosis not present

## 2021-08-20 DIAGNOSIS — K219 Gastro-esophageal reflux disease without esophagitis: Secondary | ICD-10-CM

## 2021-08-20 DIAGNOSIS — F5101 Primary insomnia: Secondary | ICD-10-CM | POA: Diagnosis not present

## 2021-08-20 MED ORDER — FLUCONAZOLE 150 MG PO TABS: ORAL_TABLET | ORAL | 1 refills | Status: DC

## 2021-08-20 MED ORDER — NYSTATIN 100000 UNIT/GM EX OINT: 1.0000 "application " | TOPICAL_OINTMENT | Freq: Two times a day (BID) | CUTANEOUS | 0 refills | Status: DC

## 2021-08-20 MED ORDER — PANTOPRAZOLE SODIUM 40 MG PO TBEC: 40.0000 mg | DELAYED_RELEASE_TABLET | Freq: Two times a day (BID) | ORAL | 3 refills | Status: DC

## 2021-08-20 MED ORDER — FAMOTIDINE 20 MG PO TABS: 20.0000 mg | ORAL_TABLET | Freq: Every evening | ORAL | 3 refills | Status: DC | PRN

## 2021-08-20 NOTE — Patient Instructions (Addendum)
It was a pleasure seeing you today. I hope your time spent with [place] was pleasant and helpful. Please let us know if there is anything we can do to improve the service you receive.  ? ?Today we discussed concerns with: ?Sleep ?Reflux ?Oral yeast infection ? ?Check out [employer] on [address] to play with the grand kids ? ? ?I have sent in pantroprazole 40mg  twice a day for at least the next 2 weeks to see if this helps with you acid reflux. If this does not help, we will need to send you to GI for an evaluation. Please let me know if this does not get better so we can send the referral.  ? ?I have sent in fluconazole to take every other day for 2 weeks. I have also sent the nystatin cream to use twice a day for at least 2 weeks to help with the oral yeast infection.  ? ?I want you to try sleeping in the recliner for the next few weeks so we can see if this helps with your sleep.  ?If it is not better in a week or two, let me know.  ? ? ?Important Office Information ?Lab Results ?If labs were ordered, please note that you will see results through MyChart as soon as they come available from [facility].  ?It takes up to 5 business days for the results to be routed to me and for me to review them once all of the lab results have come through from [facility]. I will make recommendations based on your results and send these through MyChart or someone from the office will call you to discuss. If your labs are abnormal, we may contact you to schedule a visit to discuss the results and make recommendations.  ?If you have not heard from [facility] within 5 business days or you have waited longer than a week and your lab results have not come through on MyChart, please feel free to call the office or send a message through MyChart to follow-up on these labs.  ? ?Referrals ?If referrals were placed today, the office where the referral was sent will contact you either by phone or through MyChart to set up scheduling. Please note that it can  take up to a week for the referral office to contact you. If you do not hear from them in a week, please contact the referral office directly to inquire about scheduling.  ? ?Condition Treated ?If your condition worsens or you begin to have new symptoms, please schedule a follow-up appointment for further evaluation. If you are not sure if an appointment is needed, you may call the office to leave a message for the nurse and someone will contact you with recommendations.  ?If you have an urgent or life threatening emergency, please do not call the office, but seek emergency evaluation by calling 911 or going to the nearest emergency room for evaluation.  ? ?MyChart and Phone Calls ?Please do not use MyChart for urgent messages. It may take up to 3 business days for MyChart messages to be read by staff and if they are unable to handle the request, an additional 3 business days for them to be routed to me and for my response.  ?Messages sent to the provider through MyChart do not come directly to the provider, please allow time for these messages to be routed and for me to respond.  ?We get a large volume of MyChart messages daily and these are responded to  in the order received.  ? ?For urgent messages, please call the office at [phone] and speak with the front office staff or leave a message on the line of my assistant for guidance.  ?We are seeing patients from the hours of 8:00 am through 5:00 pm and calls directly to the nurse may not be answered immediately due to seeing patients, but your call will be returned as soon as possible.  ?Phone  messages received after 4:00 PM Monday through Thursday may not be returned until the following business day. Phone messages received after 11:00 AM on Friday may not be returned until Monday.  ? ?After Hours ?We share on call hours with providers from other offices. If you have an urgent need after hours that cannot wait until the next business day, please contact the  on call provider by calling the office number. A nurse will speak with you and contact the provider if needed for recommendations.  ?If you have an urgent or life threatening emergency after hours, please do not call the on call provider, but seek emergency evaluation by calling 911 or going to the nearest emergency room for evaluation.  ? ?Paperwork ?All paperwork requires a minimum of 5 days to complete and return to you or the designated personnel. Please keep this in mind when bringing in forms or sending requests for paperwork completion to the office.  ?  ?

## 2021-08-20 NOTE — Progress Notes (Signed)
? ?Established Patient Office Visit ? ?Subjective:  ?Patient ID: Jennifer Davidson, female    DOB: 05/12/1956  Age: 66 y.o. MRN: [id number] ? ?CC:  ?Chief Complaint  ?Patient presents with  ? Mouth Lesions  ? Gastroesophageal Reflux  ? Insomnia  ? ? ?HPI ?Jennifer Davidson presents for blisters on her lip and pain.  ? ?Oral Blisters ?Jennifer Davidson reports perioral irritation to her lips as well as ulcerations/blisters/oral pain to the oral mucosa that has been present for the past several days/weeks. ?She endorses sudden onset with no known cause.   ?No new medications, foods, exposures to substances. ?Endorses that symptoms are worsening and painful. ?Oral tenderness with eating and swallowing.   ?Has not been eating well due to pain. ? ?Reflux ?Epigastric tenderness with sensation of bloating, increased gas, and esophageal burning ?Monitoring diet and avoiding foods that may exacerbate or trigger symptoms ?No change in symptoms with dietary changes ?Sleeping upright in the recliner helps relieve symptoms ?Laying down makes symptoms particularly worse ?Chronically on pantoprazole once a day, historically this is helped symptoms but is no longer working ? ?Insomnia ?Difficulty falling asleep at night ?Stays up until 2 to 3 AM and sleeps until 2 AM to noon stay on most days ?Some daytime fatigue present but unable to fall asleep at bedtime ?Has tried alternating schedule and getting up Jennifer Davidson and is still unable to fall asleep at night ?Sleeps best upright in the recliner however is not sure if this is safe for her to do ?Does not feel particularly anxious at bedtime ?Unclear of what is causing her symptoms ?Past Medical History:  ?Diagnosis Date  ? Acute cystitis without hematuria   ? Acute encephalopathy 06/25/2016  ? Altered mental status 06/23/2017  ? Anxiety   ? Anxiety and depression 02/04/2012  ? Arthritis   ? Cataract 08/31/2017  ? left eye  ? CHF (congestive heart failure) ([place]) 2017  ? resolved  ? Cholesterol serum increased    ? Chronic back pain   ? chronic Rt low back pain. s/p L4-5 fusion. failed Rt facet injections. poss due to Rt SI joint dysfunction.  ? Chronic kidney disease   ? Chronic pain of right lower extremity 05/14/2015  ? Community acquired pneumonia of left lower lobe of lung 09/25/2018  ? 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 Depression   ? Diabetes mellitus without complication ([place])   ? Diastolic heart failure ([place]) 03/17/2012  ? Edema 02/04/2012  ? Encounter for long-term (current) use of medications 02/04/2012  ? Fall as cause of accidental injury at home as place of occurrence 09/08/2018  ? Gastroenteritis 01/13/2021  ? GERD (gastroesophageal reflux disease) 11/13/2015  ? Hallucination, visual 09/14/2017  ? Head injury 09/08/2018  ? Hyperlipidemia   ? Hyperparathyroidism ([place])   ? Hyperparathyroidism, secondary renal ([place]) 07/31/2014  ? [place], MD  Plan:  25 Hydroxy Vit D (59.1 ng/ml)   ? Hypothyroidism   ? Moderate episode of recurrent major depressive disorder ([place]) 08/31/2016  ? PONV (postoperative nausea and vomiting)   ? Seizures ([place])   ? 3 years ago,2018  ? Sepsis ([place]) 04/29/2020  ? Sleep apnea   ? cpap  ? Syncope 08/30/2015  ? Syncope and collapse 02/12/2012  ? Tachycardia 02/04/2012  ? Trochanteric bursitis of both hips 2012  ? Confirmed on MRI  ? ? ?Past Surgical History:  ?Procedure Laterality Date  ? ABDOMINAL HYSTERECTOMY    ? APPENDECTOMY  1986  ?  BACK SURGERY    ? L4-5 fusion  ? CATARACT EXTRACTION    ? KNEE SURGERY    ? REVERSE SHOULDER ARTHROPLASTY Left 07/03/2020  ? Procedure: REVERSE SHOULDER ARTHROPLASTY;  Surgeon: [name], MD;  Location: [facility];  Service: Orthopedics;  Laterality: Left;  ? REVERSE SHOULDER ARTHROPLASTY Right 03/05/2021  ? Procedure: REVERSE SHOULDER ARTHROPLASTY;  Surgeon: [name], MD;  Location: [facility];  Service: Orthopedics;  Laterality: Right;  ? SHOULDER ARTHROSCOPY WITH SUBACROMIAL DECOMPRESSION  Left 10/20/2018  ? Procedure: SHOULDER ARTHROSCOPY, ROTATOR CUFF DEBRIDEMENT, GLENOHUMERAL JOINT DEBRIDEMENT, ACROMIOPLASTY, DISTAL CLAVICLE RESECTION;  Surgeon: [name], MD;  Location: [facility];  Service: Orthopedics;  Laterality: Left;  ? ? ?Family History  ?Problem Relation Age of Onset  ? Diabetes Mother   ? Heart disease Mother   ? Kidney disease Mother   ? Thyroid disease Mother   ? Heart disease Father   ? Seizures Father   ? COPD Father   ? Irritable bowel syndrome Father   ? Cancer Maternal Grandmother   ? Diabetes Sister   ? Diabetes Brother   ? Colon cancer Neg Hx   ? Stomach cancer Neg Hx   ? Colon polyps Neg Hx   ? Esophageal cancer Neg Hx   ? Rectal cancer Neg Hx   ? ? ?Social History  ? ?Socioeconomic History  ? Marital status: Married  ?  Spouse name: Jennifer Davidson  ? Number of children: 3  ? Years of education: 27  ? Highest education level: Some college, no degree  ?Occupational History  ? Occupation: Retired  ?Tobacco Use  ? Smoking status: Never  ? Smokeless tobacco: Never  ?Vaping Use  ? Vaping Use: Never used  ?Substance and Sexual Activity  ? Alcohol use: Never  ? Drug use: Never  ?  Comment: 08-31-2016 PER PT NO   ? Sexual activity: Yes  ?  Partners: Male  ?  Comment: married  ?Other Topics Concern  ? Not on file  ?Social History Narrative  ? Right handed  ? Lives with husband one story  ? Three adult children that live close by. Close relationship with grandchildren   ? ?Social Determinants of Health  ? ?Financial Resource Strain: Not on file  ?Food Insecurity: Not on file  ?Transportation Needs: Not on file  ?Physical Activity: Not on file  ?Stress: Not on file  ?Social Connections: Not on file  ?Intimate Partner Violence: Not on file  ? ? ?Outpatient Medications Prior to Visit  ?Medication Sig Dispense Refill  ? albuterol (VENTOLIN HFA) 108 (90 Base) MCG/ACT inhaler TAKE 2 PUFFS BY MOUTH EVERY 6 HOURS AS NEEDED FOR WHEEZE OR SHORTNESS OF BREATH 18 g 6  ? AMBULATORY NON FORMULARY MEDICATION  Continuous positive airway pressure (CPAP) machine set at 15 cm of H2O pressure, with all supplemental supplies as needed. 1 each 0  ? AMBULATORY NON FORMULARY MEDICATION Continuous positive airway pressure (CPAP) machine set at 15 cm of H2O pressure. Supplemental supplies: Headgear with q 6 months, Full face mask q 3 months, Full face cushion q 3 months, 1 standard tubing q 3 months, 1 water chamber q 6 months. 1 reusable filter q 6 months, 6 disposable filters q 3 months. Mask F-29. 1 each 0  ? aspirin EC 81 MG tablet Take 1 tablet (81 mg total) by mouth daily. 90 tablet 3  ? atorvastatin (LIPITOR) 20 MG tablet     ? blood glucose meter kit and supplies KIT Dispense based on  patient and insurance preference. Use up to four times daily as directed. Check blood sugar three times weekly, then as needed. 1 each 0  ? buPROPion (WELLBUTRIN XL) 150 MG 24 hr tablet Take 1 tablet (150 mg total) by mouth daily. 90 tablet 3  ? busPIRone (BUSPAR) 7.5 MG tablet Take 2 tablets (15 mg total) by mouth 3 (three) times daily. 540 tablet 1  ? carvedilol (COREG) 3.125 MG tablet Take 1 tablet (3.125 mg total) by mouth 2 (two) times daily. 180 tablet 3  ? cetirizine (ZYRTEC) 10 MG tablet Take 1 tablet (10 mg total) by mouth daily. 90 tablet 3  ? diclofenac sodium (VOLTAREN) 1 % GEL Apply 2 g topically daily as needed (for knee pain). 100 g 3  ? DULoxetine (CYMBALTA) 60 MG capsule Take 1 capsule (60 mg total) by mouth daily. 90 capsule 3  ? fluticasone (FLONASE) 50 MCG/ACT nasal spray PLACE 2 SPRAYS AT BEDTIME INTO BOTH NOSTRILS. 16 g 5  ? gabapentin (NEURONTIN) 600 MG tablet Take 1 tablet (600 mg total) by mouth every morning AND 1.5 tablets (900 mg total) at bedtime. 225 tablet 3  ? hydrOXYzine (VISTARIL) 50 MG capsule Take 1 capsule (50 mg total) by mouth every 6 (six) hours as needed. 360 capsule 2  ? levothyroxine (SYNTHROID) 50 MCG tablet Take 1 tablet (50 mcg total) by mouth every other day. Before breakfast. Alternate with 17mg  tablet 90 tablet 1  ? levothyroxine (SYNTHROID) 75 MCG tablet Take 1 tablet (75 mcg total) by mouth every other day. Before breakfast. Alternate with 518m tablet 90 tablet 1  ? meclizine (ANTIVERT) 50 MG tab

## 2021-08-27 DIAGNOSIS — B370 Candidal stomatitis: Secondary | ICD-10-CM | POA: Insufficient documentation

## 2021-08-27 DIAGNOSIS — F5101 Primary insomnia: Secondary | ICD-10-CM | POA: Insufficient documentation

## 2021-08-27 NOTE — Assessment & Plan Note (Signed)
Symptoms and presentation consistent with oral pharyngeal candidiasis.  Recommend treatment with oral Diflucan for multiple doses.  We will also send nystatin cream for application to paraoral region and labia as infection is also present there.  If no improvement in symptoms within the next week patient will notify the office and referral will be made for further evaluation.  Suspect candidal infection is causing exacerbation of GERD symptoms- treatment provided today.  ?

## 2021-08-27 NOTE — Assessment & Plan Note (Signed)
Epigastric tenderness is present on evaluation with symptoms consistent with GERD patient is currently on pantoprazole daily.  She has been monitoring her diet.  She did have success with high-dose pantoprazole approximately 2 months ago however symptoms have returned.  At this time will repeat high-dose pantoprazole. Concern that oral candidiasis is contributing to the exacerbation of symptoms at this time. Recommend treatment for oral candidal infection. If no improvement of symptoms, will send for GI referral.  ?

## 2021-08-27 NOTE — Assessment & Plan Note (Signed)
Insomnia of unknown etiology. Possible GERD symptoms could be contributing as these are causing nighttime discomfort. She is sleeping well in the recliner and able to fall asleep at normal time. She does use a CPAP and tolerates this well in the recliner. Discussed with patient that sleeping in the recliner is acceptable as long as she keeps her feet elevated. Recommend trial of bedtime at 10pm in the recliner with feet elevated and CPAP machine in place for at least 2 weeks to see if this helps regulate her sleep cycle. She is taking multiple medications with tendancies for fatigue, however, she has been on these long term and this is a new problem. If symptoms persist despite changes, will consider further evaluation. ?

## 2021-09-14 ENCOUNTER — Ambulatory Visit: Payer: Medicare Other

## 2021-09-14 DIAGNOSIS — G4733 Obstructive sleep apnea (adult) (pediatric): Secondary | ICD-10-CM | POA: Diagnosis not present

## 2021-09-16 DIAGNOSIS — G4733 Obstructive sleep apnea (adult) (pediatric): Secondary | ICD-10-CM | POA: Diagnosis not present

## 2021-09-22 DIAGNOSIS — G709 Myoneural disorder, unspecified: Secondary | ICD-10-CM | POA: Diagnosis not present

## 2021-09-22 DIAGNOSIS — I5030 Unspecified diastolic (congestive) heart failure: Secondary | ICD-10-CM | POA: Diagnosis not present

## 2021-09-22 DIAGNOSIS — N1831 Chronic kidney disease, stage 3a: Secondary | ICD-10-CM | POA: Diagnosis not present

## 2021-09-22 DIAGNOSIS — I129 Hypertensive chronic kidney disease with stage 1 through stage 4 chronic kidney disease, or unspecified chronic kidney disease: Secondary | ICD-10-CM | POA: Diagnosis not present

## 2021-09-22 DIAGNOSIS — N390 Urinary tract infection, site not specified: Secondary | ICD-10-CM | POA: Diagnosis not present

## 2021-09-22 DIAGNOSIS — Z9989 Dependence on other enabling machines and devices: Secondary | ICD-10-CM | POA: Diagnosis not present

## 2021-09-22 DIAGNOSIS — E039 Hypothyroidism, unspecified: Secondary | ICD-10-CM | POA: Diagnosis not present

## 2021-09-22 DIAGNOSIS — E785 Hyperlipidemia, unspecified: Secondary | ICD-10-CM | POA: Diagnosis not present

## 2021-09-22 DIAGNOSIS — E871 Hypo-osmolality and hyponatremia: Secondary | ICD-10-CM | POA: Diagnosis not present

## 2021-09-22 DIAGNOSIS — G4733 Obstructive sleep apnea (adult) (pediatric): Secondary | ICD-10-CM | POA: Diagnosis not present

## 2021-09-23 ENCOUNTER — Encounter (HOSPITAL_BASED_OUTPATIENT_CLINIC_OR_DEPARTMENT_OTHER): Payer: Self-pay | Admitting: Nurse Practitioner

## 2021-09-23 ENCOUNTER — Ambulatory Visit (INDEPENDENT_AMBULATORY_CARE_PROVIDER_SITE_OTHER): Payer: Medicare Other | Admitting: Nurse Practitioner

## 2021-09-23 VITALS — BP 117/72 | HR 96 | Ht 62.0 in | Wt 166.6 lb

## 2021-09-23 DIAGNOSIS — K1379 Other lesions of oral mucosa: Secondary | ICD-10-CM

## 2021-09-23 DIAGNOSIS — B370 Candidal stomatitis: Secondary | ICD-10-CM

## 2021-09-23 MED ORDER — DEXAMETHASONE 0.5 MG/5ML PO ELIX: 1.0000 mg | ORAL_SOLUTION | Freq: Three times a day (TID) | ORAL | 3 refills | Status: DC

## 2021-09-23 MED ORDER — FLUCONAZOLE 150 MG PO TABS
ORAL_TABLET | ORAL | 1 refills | Status: DC
Start: 2021-09-23 — End: 2021-10-16

## 2021-09-23 MED ORDER — NYSTATIN 100000 UNIT/GM EX OINT: 1.0000 "application " | TOPICAL_OINTMENT | Freq: Two times a day (BID) | CUTANEOUS | 1 refills | Status: DC

## 2021-09-23 NOTE — Patient Instructions (Signed)
Swish with the dexamethasone 3-4 times a day ?May also use childrens liquid benadryl (diphendyramine) 77mL swish twice a day ?Spit both of these out after swishing. This will help with the swelling and pain ? ?Start the diflucan pill every other day.  ? ?Use the nystatin cream to the mouth 2-4 times a day.  ? ?I will let you know if the swab shows anything or if I can find any other ideas and I will call you ?

## 2021-09-24 LAB — CBC WITH DIFFERENTIAL/PLATELET
Basophils Absolute: 0.1 10*3/uL (ref 0.0–0.2)
Basos: 1 %
EOS (ABSOLUTE): 0.4 10*3/uL (ref 0.0–0.4)
Eos: 4 %
Hematocrit: 35.1 % (ref 34.0–46.6)
Hemoglobin: 11.6 g/dL (ref 11.1–15.9)
Immature Grans (Abs): 0.3 10*3/uL — ABNORMAL HIGH (ref 0.0–0.1)
Immature Granulocytes: 3 %
Lymphocytes Absolute: 2.8 10*3/uL (ref 0.7–3.1)
Lymphs: 26 %
MCH: 27.9 pg (ref 26.6–33.0)
MCHC: 33 g/dL (ref 31.5–35.7)
MCV: 84 fL (ref 79–97)
Monocytes Absolute: 0.5 10*3/uL (ref 0.1–0.9)
Monocytes: 5 %
Neutrophils Absolute: 6.7 10*3/uL (ref 1.4–7.0)
Neutrophils: 61 %
Platelets: 404 10*3/uL (ref 150–450)
RBC: 4.16 x10E6/uL (ref 3.77–5.28)
RDW: 15.9 % — ABNORMAL HIGH (ref 11.7–15.4)
WBC: 10.8 10*3/uL (ref 3.4–10.8)

## 2021-09-24 LAB — HSV(HERPES SIMPLEX VRS) I + II AB-IGG
HSV 1 Glycoprotein G Ab, IgG: <0.91 index (ref 0.00–0.90)
HSV 2 IgG, Type Spec: >23.6 index — ABNORMAL HIGH (ref 0.00–0.90)

## 2021-09-24 LAB — COMPREHENSIVE METABOLIC PANEL
ALT: 11 IU/L (ref 0–32)
AST: 11 IU/L (ref 0–40)
Albumin/Globulin Ratio: 1.6 (ref 1.2–2.2)
Albumin: 4.2 g/dL (ref 3.8–4.8)
Alkaline Phosphatase: 85 IU/L (ref 44–121)
BUN/Creatinine Ratio: 11 — ABNORMAL LOW (ref 12–28)
BUN: 17 mg/dL (ref 8–27)
Bilirubin Total: <0.2 mg/dL (ref 0.0–1.2)
CO2: 21 mmol/L (ref 20–29)
Calcium: 9.5 mg/dL (ref 8.7–10.3)
Chloride: 101 mmol/L (ref 96–106)
Creatinine, Ser: 1.52 mg/dL — ABNORMAL HIGH (ref 0.57–1.00)
Globulin, Total: 2.7 g/dL (ref 1.5–4.5)
Glucose: 88 mg/dL (ref 70–99)
Potassium: 4.1 mmol/L (ref 3.5–5.2)
Sodium: 139 mmol/L (ref 134–144)
Total Protein: 6.9 g/dL (ref 6.0–8.5)
eGFR: 37 mL/min/{1.73_m2} — ABNORMAL LOW (ref >59)

## 2021-09-25 ENCOUNTER — Encounter (HOSPITAL_BASED_OUTPATIENT_CLINIC_OR_DEPARTMENT_OTHER): Payer: Self-pay | Admitting: Nurse Practitioner

## 2021-09-25 DIAGNOSIS — F515 Nightmare disorder: Secondary | ICD-10-CM

## 2021-09-25 DIAGNOSIS — F4310 Post-traumatic stress disorder, unspecified: Secondary | ICD-10-CM

## 2021-09-25 DIAGNOSIS — F39 Unspecified mood [affective] disorder: Secondary | ICD-10-CM

## 2021-09-25 DIAGNOSIS — F411 Generalized anxiety disorder: Secondary | ICD-10-CM

## 2021-09-25 DIAGNOSIS — F339 Major depressive disorder, recurrent, unspecified: Secondary | ICD-10-CM

## 2021-09-28 ENCOUNTER — Other Ambulatory Visit (HOSPITAL_BASED_OUTPATIENT_CLINIC_OR_DEPARTMENT_OTHER): Payer: Self-pay | Admitting: Nurse Practitioner

## 2021-09-28 DIAGNOSIS — J392 Other diseases of pharynx: Secondary | ICD-10-CM

## 2021-09-28 MED ORDER — VALACYCLOVIR HCL 1 G PO TABS: 1000.0000 mg | ORAL_TABLET | Freq: Two times a day (BID) | ORAL | 0 refills | Status: DC

## 2021-09-29 DIAGNOSIS — K1379 Other lesions of oral mucosa: Secondary | ICD-10-CM | POA: Insufficient documentation

## 2021-09-29 NOTE — Progress Notes (Signed)
?  Shawna Clamp, DNP, AGNP-c ?[facility] ?[address] ?[address] ?[place], [place] [address] ?[phone] Office [phone] Fax ? ?ESTABLISHED PATIENT- Chronic Health and/or Follow-Up Visit ? ?Blood pressure 117/72, pulse 96, height 5\' 2"  (1.575 m), weight 166 lb 9.6 oz (75.6 kg), SpO2 97 %. ? ?Mouth Lesions ? ? ?HPI ? ?Jennifer Davidson  is a 66 y.o. year old female presenting today for evaluation and management of the following: ?Oral ulcerations and pain ?Previous history of the same without diagnosis ?Symptoms initially resolved after long term use of diflucan and rest ?Symptoms returned over the last few days with tenderness, erythema, and cracking to the lips ?Ulcerations present on the oral mucosa and at the gum line ?Difficult to chew and swallow due to pain ?No fevers, chills, recent illness ?Started out similar to a cold sore and progressed rapidly ?No treatment for this encounter yet ?No new foods, cosmetics, water sources ? ?ROS ?All ROS negative with exception of what is listed in HPI ? ?PHYSICAL EXAM ?Physical Exam ?Vitals and nursing note reviewed.  ?Constitutional:   ?   Appearance: She is ill-appearing.  ?HENT:  ?   Head: Normocephalic.  ?   Nose: Nose normal.  ?   Mouth/Throat:  ?   Lips: Lesions present.  ?   Mouth: Mucous membranes are dry. Oral lesions present.  ?   Dentition: No dental tenderness or gingival swelling.  ?   Tongue: No lesions.  ?   Palate: No lesions.  ?   Pharynx: Oropharynx is clear. Uvula midline. No pharyngeal swelling or oropharyngeal exudate.  ?   Comments: Dryness and cracking present to the lips and surrounding tissue with erythema present. Oral ulcerations present scattered throughout the oral mucosa sparing the tongue, throat, and roof of the mouth ?Eyes:  ?   Extraocular Movements: Extraocular movements intact.  ?   Conjunctiva/sclera: Conjunctivae normal.  ?   Pupils: Pupils are equal, round, and reactive to light.  ?Neurological:   ?   Mental Status: She is alert.  ? ? ?ASSESSMENT & PLAN ?Problem List Items Addressed This Visit   ? ? Recurrent oral ulcers - Primary  ?  Ulcerations of unknown etiology presenting for second time in less than 6 months.  ?Previous encounter resolved with consistent diflucan treatment. Will plan to begin this treatment again today.  ?Oral swab taken for testing.  ?Recommend f/u if symptoms do not improve in the next few days.  ?May consider treatment with valacyclovir given the initial presence of suspected HSV ulceration.  ? ?  ?  ? Relevant Medications  ? fluconazole (DIFLUCAN) 150 MG tablet  ? dexamethasone 0.5 MG/5ML elixir  ? nystatin ointment (MYCOSTATIN)  ? Other Relevant Orders  ? CBC with Differential/Platelet (Completed)  ? Comprehensive metabolic panel (Completed)  ? HSV(herpes simplex vrs) 1+2 ab-IgG (Completed)  ? Oral pharyngeal candidiasis  ? Relevant Medications  ? fluconazole (DIFLUCAN) 150 MG tablet  ? nystatin ointment (MYCOSTATIN)  ? ?ADDENDUM: Treatment with valacyclovir initiated. Buccal swab testing unable to be performed. If symptoms persist, will plan for re-swab.  ? ?FOLLOW-UP ?Return if symptoms worsen or fail to improve. ? ?79, DNP, AGNP-c ?09/23/2021  9:22 AM ?

## 2021-09-29 NOTE — Assessment & Plan Note (Signed)
Ulcerations of unknown etiology presenting for second time in less than 6 months.  ?Previous encounter resolved with consistent diflucan treatment. Will plan to begin this treatment again today.  ?Oral swab taken for testing.  ?Recommend f/u if symptoms do not improve in the next few days.  ?May consider treatment with valacyclovir given the initial presence of suspected HSV ulceration.  ?

## 2021-10-05 MED ORDER — BUSPIRONE HCL 7.5 MG PO TABS: 15.0000 mg | ORAL_TABLET | Freq: Three times a day (TID) | ORAL | 3 refills | Status: DC

## 2021-10-14 NOTE — Progress Notes (Unsigned)
07/16/21- 65 yoF never smoker for sleep evaluation courtesy of Jennifer Skeens, NP with concern of OSA Medical problem list includes CHF, Migraine, HTN, OSA, GERD, DM, Hyperparathyroid, Hypothyroid, Dream Anxiety Disorder, Peripheral Neuropathy,  CKD, Anxiety, Depression, PTSD, Hyperlipidemia,  No sleep study found CPAP 15/ Body weight today-170 lbs Epworth score 8 Covid vax-4 Moderna Flu vax-had Has seen Psychiatry/ Neurology for depression, PTSD, ?seizure,  Had sleep study 10 or 15 years ago, ordered by an Administrator, arts in the [place] area.  Has been using CPAP ever since.  Machine is now very old.  If it slips off she snores loudly.  Has been getting supplies online with no CPAP follow-up. Takes prazosin at bedtime to help sleep.  Significant problem with nightmares in the past is somewhat better now.  I mentioned availability of prescription application [employer], since she has a smart watch and iPhone.  She can talk to her behavioral health providers about this.  Has adjustable bed.  Husband usually sleeps in a separate bedroom because her restlessness disturbs his sleep.  No complex parasomnias described. She denies ENT surgery, heart or lung problems.  I did not explore her history of CHF. She indicates she has lost about 43 pounds in the last couple of years.  10/16/21- 60 yoF never smoker followed for OSA, complicated by CHF, Migraine, HTN, OSA, GERD, DM, Hyperparathyroid, Hypothyroid, Dream Anxiety Disorder, Peripheral Neuropathy, CKD, Anxiety, Depression, PTSD, Hyperlipidemia, Seizure,  -Buspar, Wellbutrin, Cymbalta, Seroquel, Vistaril, gabapentin,  -Covid vax- 3 Moderna, 1 Phizer HST 09/14/21- AHI 11.1/ hr, desaturation to 83%, body weight 170 lbs Body weight today-169 lbs Husband here today.  They sleep separately. Sleep study results reviewed.  She wants to continue CPAP with replacement for her very old machine.  Had not been using a DME company.  She has been using a fixed pressure machine at 15  CWP and we discussed changing to AutoPap. She says her breathing otherwise is good "most of the time" with no concerns currently.                                      ROS-see HPI   + = positive Constitutional:    +weight loss, night sweats, fevers, chills, fatigue, lassitude. HEENT:    headaches, difficulty swallowing, tooth/dental problems, sore throat,       sneezing, itching, ear ache, nasal congestion, post nasal drip, snoring CV:    chest pain, orthopnea, PND, swelling in lower extremities, anasarca,     dizziness, palpitations Resp:   shortness of breath with exertion or at rest.                productive cough,   non-productive cough, coughing up of blood.              change in color of mucus.  wheezing.   Skin:    rash or lesions. GI:  No-   heartburn, indigestion, abdominal pain, nausea, vomiting, diarrhea,                 change in bowel habits, loss of appetite GU: dysuria, change in color of urine, no urgency or frequency.   flank pain. MS:   joint pain, stiffness, decreased range of motion, back pain. Neuro-     nothing unusual Psych:  change in mood or affect.  depression or anxiety.   memory loss.  OBJ- Physical Exam General- Alert, Oriented, Affect-appropriate,  Distress- none acute Skin- rash-none, lesions- none, excoriation- none Lymphadenopathy- none Head- atraumatic            Eyes- Gross vision intact, PERRLA, conjunctivae and secretions clear            Ears- Hearing, canals-normal            Nose- Clear, no-Septal dev, mucus, polyps, erosion, perforation             Throat- Mallampati II I-IV, mucosa clear , drainage- none, tonsils- atrophic, +teeth Neck- flexible , trachea midline, no stridor , thyroid nl, carotid no bruit Chest - symmetrical excursion , unlabored           Heart/CV- RRR , no murmur , no gallop  , no rub, nl s1 s2                           - JVD- none , edema- none, stasis changes- none, varices- none           Lung- clear to P&A, wheeze- none,  cough- none , dullness-none, rub- none           Chest wall-  Abd-  Br/ Gen/ Rectal- Not done, not indicated Extrem- cyanosis- none, clubbing, none, atrophy- none, strength- nl Neuro- grossly intact to observation

## 2021-10-16 ENCOUNTER — Encounter: Payer: Self-pay | Admitting: Internal Medicine

## 2021-10-16 ENCOUNTER — Ambulatory Visit (INDEPENDENT_AMBULATORY_CARE_PROVIDER_SITE_OTHER): Payer: Medicare Other | Admitting: Internal Medicine

## 2021-10-16 VITALS — BP 120/72 | HR 84 | Temp 97.5°F | Ht 63.0 in | Wt 169.0 lb

## 2021-10-16 DIAGNOSIS — I2510 Atherosclerotic heart disease of native coronary artery without angina pectoris: Secondary | ICD-10-CM

## 2021-10-16 DIAGNOSIS — G4733 Obstructive sleep apnea (adult) (pediatric): Secondary | ICD-10-CM | POA: Diagnosis not present

## 2021-10-16 DIAGNOSIS — I2583 Coronary atherosclerosis due to lipid rich plaque: Secondary | ICD-10-CM | POA: Diagnosis not present

## 2021-10-16 DIAGNOSIS — F515 Nightmare disorder: Secondary | ICD-10-CM

## 2021-10-16 NOTE — Assessment & Plan Note (Signed)
She has not reported active problems currently with this diagnosis but we will be interested if it becomes an issue.

## 2021-10-16 NOTE — Assessment & Plan Note (Signed)
She has long-term successful experience with CPAP and wants to continue, based on the current sleep study. Plan-replacement for old CPAP machine, changing to auto 5-20.  Establish DME.

## 2021-10-16 NOTE — Patient Instructions (Signed)
Order- new DME, replace old CPAP machine auto 5-20, mask of choice, humidifier, supplies, [place], card  Please call if we can help

## 2021-10-18 ENCOUNTER — Encounter (HOSPITAL_BASED_OUTPATIENT_CLINIC_OR_DEPARTMENT_OTHER): Payer: Self-pay | Admitting: Nurse Practitioner

## 2021-10-18 DIAGNOSIS — K219 Gastro-esophageal reflux disease without esophagitis: Secondary | ICD-10-CM

## 2021-10-20 MED ORDER — METOCLOPRAMIDE HCL 10 MG PO TABS: 5.0000 mg | ORAL_TABLET | Freq: Three times a day (TID) | ORAL | 3 refills | Status: DC | PRN

## 2021-10-20 MED ORDER — FAMOTIDINE 20 MG PO TABS: 20.0000 mg | ORAL_TABLET | Freq: Two times a day (BID) | ORAL | 3 refills | Status: DC

## 2021-10-22 ENCOUNTER — Encounter (HOSPITAL_BASED_OUTPATIENT_CLINIC_OR_DEPARTMENT_OTHER): Payer: Self-pay | Admitting: Nurse Practitioner

## 2021-10-22 DIAGNOSIS — R112 Nausea with vomiting, unspecified: Secondary | ICD-10-CM

## 2021-10-23 ENCOUNTER — Other Ambulatory Visit (HOSPITAL_BASED_OUTPATIENT_CLINIC_OR_DEPARTMENT_OTHER): Payer: Self-pay

## 2021-10-23 MED ORDER — PROMETHAZINE HCL 25 MG RE SUPP: 25.0000 mg | Freq: Four times a day (QID) | RECTAL | 3 refills | Status: DC | PRN

## 2021-10-28 ENCOUNTER — Other Ambulatory Visit: Payer: Self-pay | Admitting: Neurology

## 2021-10-28 ENCOUNTER — Encounter (HOSPITAL_BASED_OUTPATIENT_CLINIC_OR_DEPARTMENT_OTHER): Payer: Self-pay | Admitting: Nurse Practitioner

## 2021-10-28 ENCOUNTER — Other Ambulatory Visit (HOSPITAL_BASED_OUTPATIENT_CLINIC_OR_DEPARTMENT_OTHER): Payer: Self-pay | Admitting: Nurse Practitioner

## 2021-10-28 DIAGNOSIS — J392 Other diseases of pharynx: Secondary | ICD-10-CM

## 2021-10-30 MED ORDER — VALACYCLOVIR HCL 1 G PO TABS
1000.0000 mg | ORAL_TABLET | Freq: Every day | ORAL | 99 refills | Status: AC
Start: 2021-10-30 — End: 2021-11-09

## 2021-11-03 ENCOUNTER — Other Ambulatory Visit (HOSPITAL_BASED_OUTPATIENT_CLINIC_OR_DEPARTMENT_OTHER): Payer: Self-pay | Admitting: Nurse Practitioner

## 2021-11-03 DIAGNOSIS — K219 Gastro-esophageal reflux disease without esophagitis: Secondary | ICD-10-CM

## 2021-11-19 ENCOUNTER — Ambulatory Visit (HOSPITAL_BASED_OUTPATIENT_CLINIC_OR_DEPARTMENT_OTHER): Payer: Medicare Other | Admitting: Nurse Practitioner

## 2021-11-23 ENCOUNTER — Ambulatory Visit (HOSPITAL_BASED_OUTPATIENT_CLINIC_OR_DEPARTMENT_OTHER): Payer: Medicare Other | Admitting: Nurse Practitioner

## 2021-11-23 ENCOUNTER — Encounter (HOSPITAL_BASED_OUTPATIENT_CLINIC_OR_DEPARTMENT_OTHER): Payer: Self-pay

## 2021-12-02 MED ORDER — PANTOPRAZOLE SODIUM 40 MG PO TBEC: 40.0000 mg | DELAYED_RELEASE_TABLET | Freq: Two times a day (BID) | ORAL | 3 refills | Status: DC

## 2021-12-02 NOTE — Addendum Note (Signed)
Addended by: Gibson Telleria, [place] Dec E on: 12/02/2021 09:24 PM   Modules accepted: Orders

## 2021-12-04 ENCOUNTER — Other Ambulatory Visit: Payer: Self-pay | Admitting: Nurse Practitioner

## 2021-12-04 DIAGNOSIS — Z1231 Encounter for screening mammogram for malignant neoplasm of breast: Secondary | ICD-10-CM

## 2021-12-19 ENCOUNTER — Encounter (HOSPITAL_BASED_OUTPATIENT_CLINIC_OR_DEPARTMENT_OTHER): Payer: Self-pay | Admitting: Nurse Practitioner

## 2021-12-19 DIAGNOSIS — J392 Other diseases of pharynx: Secondary | ICD-10-CM

## 2021-12-22 ENCOUNTER — Other Ambulatory Visit (HOSPITAL_BASED_OUTPATIENT_CLINIC_OR_DEPARTMENT_OTHER): Payer: Self-pay | Admitting: Nurse Practitioner

## 2021-12-22 ENCOUNTER — Ambulatory Visit
Admission: RE | Admit: 2021-12-22 | Discharge: 2021-12-22 | Disposition: A | Discharge status: Home / Self Care | Payer: Medicare Other | Source: Ambulatory Visit | Attending: Nurse Practitioner | Admitting: Nurse Practitioner

## 2021-12-22 DIAGNOSIS — R110 Nausea: Secondary | ICD-10-CM

## 2021-12-22 DIAGNOSIS — Z1231 Encounter for screening mammogram for malignant neoplasm of breast: Secondary | ICD-10-CM | POA: Diagnosis not present

## 2021-12-23 ENCOUNTER — Encounter (INDEPENDENT_AMBULATORY_CARE_PROVIDER_SITE_OTHER): Payer: Self-pay

## 2021-12-24 ENCOUNTER — Ambulatory Visit (HOSPITAL_BASED_OUTPATIENT_CLINIC_OR_DEPARTMENT_OTHER): Payer: Medicare Other | Admitting: Nurse Practitioner

## 2021-12-25 MED ORDER — VALACYCLOVIR HCL 500 MG PO TABS: 500.0000 mg | ORAL_TABLET | Freq: Every day | ORAL | 3 refills | Status: AC

## 2022-01-07 ENCOUNTER — Ambulatory Visit (INDEPENDENT_AMBULATORY_CARE_PROVIDER_SITE_OTHER): Payer: Medicare Other | Admitting: Nurse Practitioner

## 2022-01-07 ENCOUNTER — Encounter (HOSPITAL_BASED_OUTPATIENT_CLINIC_OR_DEPARTMENT_OTHER): Payer: Self-pay | Admitting: Nurse Practitioner

## 2022-01-07 VITALS — BP 135/78 | HR 75 | Ht 62.0 in | Wt 171.4 lb

## 2022-01-07 DIAGNOSIS — N182 Chronic kidney disease, stage 2 (mild): Secondary | ICD-10-CM | POA: Diagnosis not present

## 2022-01-07 DIAGNOSIS — K1379 Other lesions of oral mucosa: Secondary | ICD-10-CM

## 2022-01-07 DIAGNOSIS — E782 Mixed hyperlipidemia: Secondary | ICD-10-CM

## 2022-01-07 DIAGNOSIS — K130 Diseases of lips: Secondary | ICD-10-CM

## 2022-01-07 DIAGNOSIS — R7303 Prediabetes: Secondary | ICD-10-CM | POA: Diagnosis not present

## 2022-01-07 DIAGNOSIS — E038 Other specified hypothyroidism: Secondary | ICD-10-CM | POA: Diagnosis not present

## 2022-01-07 DIAGNOSIS — F39 Unspecified mood [affective] disorder: Secondary | ICD-10-CM | POA: Diagnosis not present

## 2022-01-07 MED ORDER — MUPIROCIN 2 % EX OINT: TOPICAL_OINTMENT | CUTANEOUS | 3 refills | Status: DC

## 2022-01-07 MED ORDER — CEPHALEXIN 500 MG PO CAPS: 500.0000 mg | ORAL_CAPSULE | Freq: Three times a day (TID) | ORAL | 0 refills | Status: DC

## 2022-01-07 MED ORDER — ALPRAZOLAM 0.5 MG PO TABS: 0.5000 mg | ORAL_TABLET | Freq: Two times a day (BID) | ORAL | 2 refills | Status: DC | PRN

## 2022-01-07 MED ORDER — FLUCONAZOLE 150 MG PO TABS: ORAL_TABLET | ORAL | 2 refills | Status: DC

## 2022-01-07 NOTE — Progress Notes (Signed)
Jennifer Clamp, DNP, AGNP-c [facility] [address] [place], [place] [address] [phone] Office [phone] Fax  ESTABLISHED PATIENT- Chronic Health and/or Follow-Up Visit  Blood pressure 135/78, pulse 75, height 5\' 2"  (1.575 m), weight 171 lb 6.4 oz (77.7 kg), SpO2 95 %.  Follow-up (Patient is not feeling well. Her lips are really bad./)   HPI  Jennifer Davidson  is a 66 y.o. year old female presenting today for evaluation and management of the following: Oral Ulcerations [name] tells me the oral ulcerations are persisting and still causing severe pain to her lips and inside of her mouth. She has completed treatment with Diflucan with initial reduction of symptoms but then they returned.  Second treatment with Diflucan was not effective.  Treatment with valacyclovir was mildly effective. She tells me that the ongoing symptoms are causing exacerbation of her depression and anxiety symptoms which is very frustrating for her. She denies fevers, chills, nausea, vomiting. Hypothyroid She endorses taking her medication as prescribed with no missed doses. She denies any symptoms at this time CKD She denies any concerns with consistent lower extremity edema, decreased urinary output, palpitations, shortness of breath. She is monitoring her diet appropriately Prediabetes She endorses blood sugars primarily in the low 100s.  She is not checking these on a daily basis. She is monitoring her diet and working towards lower carbohydrates Mood She endorses exacerbation of depression and anxiety with the recurrent oral ulcers. She is continuing on her current medication without any missed doses She does need a refill on her Xanax today  ROS All ROS negative with exception of what is listed in HPI  PHYSICAL EXAM Physical Exam Vitals and nursing note reviewed.  Constitutional:      General: She is not in acute distress.    Appearance: Normal  appearance.  HENT:     Head: Normocephalic.     Mouth/Throat:     Lips: Lesions present.     Mouth: Mucous membranes are dry. Oral lesions present. No angioedema.     Dentition: No gum lesions.     Tongue: No lesions.     Palate: No lesions.     Pharynx: Uvula midline. Posterior oropharyngeal erythema present. No pharyngeal swelling or oropharyngeal exudate.     Tonsils: No tonsillar exudate or tonsillar abscesses.     Comments: Ulcerations noted to the oral mucosa, gums, and lips with no evidence of throat involvement. Lips are dry and cracked. No edema noted.  Eyes:     Extraocular Movements: Extraocular movements intact.     Conjunctiva/sclera: Conjunctivae normal.     Pupils: Pupils are equal, round, and reactive to light.  Neck:     Vascular: No carotid bruit.  Cardiovascular:     Rate and Rhythm: Normal rate and regular rhythm.     Pulses: Normal pulses.     Heart sounds: Normal heart sounds. No murmur heard. Pulmonary:     Effort: Pulmonary effort is normal.     Breath sounds: Normal breath sounds. No wheezing.  Abdominal:     General: Bowel sounds are normal. There is no distension.     Palpations: Abdomen is soft.     Tenderness: There is no abdominal tenderness. There is no guarding.  Musculoskeletal:        General: Normal range of motion.     Cervical back: Normal range of motion and neck supple.     Right lower leg: No edema.  Left lower leg: No edema.  Lymphadenopathy:     Cervical: No cervical adenopathy.  Skin:    General: Skin is warm and dry.     Capillary Refill: Capillary refill takes less than 2 seconds.  Neurological:     General: No focal deficit present.     Mental Status: She is alert and oriented to person, place, and time.  Psychiatric:        Mood and Affect: Mood normal.        Behavior: Behavior normal.        Thought Content: Thought content normal.        Judgment: Judgment normal.     ASSESSMENT & PLAN Problem List Items Addressed  This Visit     Chronic kidney disease (Chronic)    Chronic.  Labs pending.  Encourage monitoring protein intake and blood pressure and blood sugar control.      Relevant Orders   CBC With Diff/Platelet (Completed)   Comprehensive metabolic panel (Completed)   Lipid panel (Completed)   Thyroid Panel With TSH (Completed)   Hemoglobin A1c (Completed)   B12 and Folate Panel (Completed)   Hypothyroid (Chronic)    Chronic.  Labs pending.  No new symptoms at this time.      Relevant Orders   CBC With Diff/Platelet (Completed)   Comprehensive metabolic panel (Completed)   Lipid panel (Completed)   Thyroid Panel With TSH (Completed)   Hemoglobin A1c (Completed)   B12 and Folate Panel (Completed)   Mixed hyperlipidemia    Chronic.  Currently on statin therapy.  Labs pending.      Relevant Orders   CBC With Diff/Platelet (Completed)   Comprehensive metabolic panel (Completed)   Lipid panel (Completed)   Thyroid Panel With TSH (Completed)   Hemoglobin A1c (Completed)   B12 and Folate Panel (Completed)   Mood disorder (HCC)    Chronic.  Slight exacerbation of mood related to oral ulcers of unknown etiology of the pain associated with these.  We will continue to monitor closely.      Relevant Medications   ALPRAZolam (XANAX) 0.5 MG tablet   Other Relevant Orders   CBC With Diff/Platelet (Completed)   Comprehensive metabolic panel (Completed)   Lipid panel (Completed)   Thyroid Panel With TSH (Completed)   Hemoglobin A1c (Completed)   B12 and Folate Panel (Completed)   Prediabetes    Chronic.  Blood glucose readings have been stable in the low 100s.  No alarm symptoms present at this time.  Labs pending.      Relevant Orders   CBC With Diff/Platelet (Completed)   Comprehensive metabolic panel (Completed)   Lipid panel (Completed)   Thyroid Panel With TSH (Completed)   Hemoglobin A1c (Completed)   B12 and Folate Panel (Completed)   Recurrent oral ulcers    Recurrent  ulcerations to the oral mucosa.  Previously have resolved with consistent Diflucan treatment, however subsequent treatment was not effective.  Valacyclovir has not been effective.  Oral swab showed no evidence of bacteria on culture previously.  Symptoms are worsening and fairly severe in nature.  Will trial treatment with oral Keflex and mupirocin ointment.  Patient instructed to follow-up if no improvement over the next few days.      Relevant Orders   CBC With Diff/Platelet (Completed)   Comprehensive metabolic panel (Completed)   Lipid panel (Completed)   Thyroid Panel With TSH (Completed)   Hemoglobin A1c (Completed)   B12 and Folate Panel (Completed)  MRSA MSSA Screening Culture (Completed)   Other Visit Diagnoses     Dermatosis of lip due to infection    -  Primary   Relevant Medications   mupirocin ointment (BACTROBAN) 2 %   cephALEXin (KEFLEX) 500 MG capsule   fluconazole (DIFLUCAN) 150 MG tablet   Other Relevant Orders   CBC With Diff/Platelet (Completed)   Comprehensive metabolic panel (Completed)   Lipid panel (Completed)   Thyroid Panel With TSH (Completed)   Hemoglobin A1c (Completed)   B12 and Folate Panel (Completed)        FOLLOW-UP Return in about 6 months (around 07/10/2022) for Chronic Management.   Jennifer Clamp, DNP, AGNP-c 01/07/2022  1:13 PM

## 2022-01-07 NOTE — Patient Instructions (Addendum)
I will look over all of your medications and see if there is anything you can come off of.   I have sent in an antibiotic and a ointment for your mouth. We are going to pray this does the trick!! If you do not see any improvement by Monday I want you to let me know.

## 2022-01-08 LAB — B12 AND FOLATE PANEL
Folate: >20 ng/mL (ref >3.0)
Vitamin B-12: 342 pg/mL (ref 232–1245)

## 2022-01-08 LAB — CBC WITH DIFF/PLATELET
Basophils Absolute: 0.1 10*3/uL (ref 0.0–0.2)
Basos: 1 %
EOS (ABSOLUTE): 0.3 10*3/uL (ref 0.0–0.4)
Eos: 4 %
Hematocrit: 37.5 % (ref 34.0–46.6)
Hemoglobin: 12.4 g/dL (ref 11.1–15.9)
Immature Grans (Abs): 0.1 10*3/uL (ref 0.0–0.1)
Immature Granulocytes: 1 %
Lymphocytes Absolute: 2.2 10*3/uL (ref 0.7–3.1)
Lymphs: 29 %
MCH: 29.5 pg (ref 26.6–33.0)
MCHC: 33.1 g/dL (ref 31.5–35.7)
MCV: 89 fL (ref 79–97)
Monocytes Absolute: 0.6 10*3/uL (ref 0.1–0.9)
Monocytes: 8 %
Neutrophils Absolute: 4.5 10*3/uL (ref 1.4–7.0)
Neutrophils: 57 %
Platelets: 290 10*3/uL (ref 150–450)
RBC: 4.2 x10E6/uL (ref 3.77–5.28)
RDW: 14.8 % (ref 11.7–15.4)
WBC: 7.8 10*3/uL (ref 3.4–10.8)

## 2022-01-08 LAB — COMPREHENSIVE METABOLIC PANEL
ALT: 14 IU/L (ref 0–32)
AST: 17 IU/L (ref 0–40)
Albumin/Globulin Ratio: 2 (ref 1.2–2.2)
Albumin: 4.3 g/dL (ref 3.9–4.9)
Alkaline Phosphatase: 61 IU/L (ref 44–121)
BUN/Creatinine Ratio: 9 — ABNORMAL LOW (ref 12–28)
BUN: 14 mg/dL (ref 8–27)
Bilirubin Total: 0.2 mg/dL (ref 0.0–1.2)
CO2: 24 mmol/L (ref 20–29)
Calcium: 9.9 mg/dL (ref 8.7–10.3)
Chloride: 95 mmol/L — ABNORMAL LOW (ref 96–106)
Creatinine, Ser: 1.52 mg/dL — ABNORMAL HIGH (ref 0.57–1.00)
Globulin, Total: 2.1 g/dL (ref 1.5–4.5)
Glucose: 88 mg/dL (ref 70–99)
Potassium: 5.4 mmol/L — ABNORMAL HIGH (ref 3.5–5.2)
Sodium: 133 mmol/L — ABNORMAL LOW (ref 134–144)
Total Protein: 6.4 g/dL (ref 6.0–8.5)
eGFR: 37 mL/min/{1.73_m2} — ABNORMAL LOW (ref >59)

## 2022-01-08 LAB — LIPID PANEL
Chol/HDL Ratio: 3.2 ratio (ref 0.0–4.4)
Cholesterol, Total: 227 mg/dL — ABNORMAL HIGH (ref 100–199)
HDL: 71 mg/dL (ref >39)
LDL Chol Calc (NIH): 136 mg/dL — ABNORMAL HIGH (ref 0–99)
Triglycerides: 112 mg/dL (ref 0–149)
VLDL Cholesterol Cal: 20 mg/dL (ref 5–40)

## 2022-01-08 LAB — THYROID PANEL WITH TSH
Free Thyroxine Index: 1.2 (ref 1.2–4.9)
T3 Uptake Ratio: 26 % (ref 24–39)
T4, Total: 4.5 ug/dL (ref 4.5–12.0)
TSH: 1.8 u[IU]/mL (ref 0.450–4.500)

## 2022-01-08 LAB — HEMOGLOBIN A1C
Est. average glucose Bld gHb Est-mCnc: 105 mg/dL
Hgb A1c MFr Bld: 5.3 % (ref 4.8–5.6)

## 2022-01-11 DIAGNOSIS — K1379 Other lesions of oral mucosa: Secondary | ICD-10-CM | POA: Diagnosis not present

## 2022-01-13 LAB — RESULT

## 2022-01-13 LAB — MRSA MSSA SCREENING CULTURE

## 2022-01-14 ENCOUNTER — Encounter: Payer: Self-pay | Admitting: Internal Medicine

## 2022-01-16 ENCOUNTER — Encounter (HOSPITAL_BASED_OUTPATIENT_CLINIC_OR_DEPARTMENT_OTHER): Payer: Self-pay | Admitting: Nurse Practitioner

## 2022-01-18 NOTE — Progress Notes (Signed)
HPI F never smoker followed for OSA, complicated by CHF, Migraine, HTN, OSA, GERD, DM, Hyperparathyroid, Hypothyroid, Dream Anxiety Disorder, Peripheral Neuropathy, CKD, Anxiety, Depression, PTSD, Hyperlipidemia, Seizure,  HST 09/14/21- AHI 11.1/ hr, desaturation to 83%, body weight 170 lbs  =======================================================================================  10/16/21- 65 yoF never smoker followed for OSA, complicated by CHF, Migraine, HTN, OSA, GERD, DM, Hyperparathyroid, Hypothyroid, Dream Anxiety Disorder, Peripheral Neuropathy, CKD, Anxiety, Depression, PTSD, Hyperlipidemia, Seizure,  -Buspar, Wellbutrin, Cymbalta, Seroquel, Vistaril, gabapentin,  -Covid vax- 3 Moderna, 1 Phizer HST 09/14/21- AHI 11.1/ hr, desaturation to 83%, body weight 170 lbs Body weight today-169 lbs Husband here today.  They sleep separately. Sleep study results reviewed.  She wants to continue CPAP with replacement for her very old machine.  Had not been using a DME company.  She has been using a fixed pressure machine at 15 CWP and we discussed changing to AutoPap. She says her breathing otherwise is good "most of the time" with no concerns currently.    01/19/22-    66 yoF never smoker followed for OSA, complicated by CHF, Migraine, HTN, OSA, GERD, DM, Hyperparathyroid, Hypothyroid, Dream Anxiety Disorder, Peripheral Neuropathy, CKD, Anxiety, Depression, PTSD, Hyperlipidemia, Seizure,  -Buspar, Wellbutrin, Cymbalta, Seroquel, Vistaril, gabapentin,  -Covid vax- 3 Moderna, 1 Phizer        CPAP auto 5-15/ [facility]   ordered 10/16/21    AirSense 10 AutoSet Download compliance-90%, AHI 3/ hr Body weight today-171 lbs -----Pt f/u for OSA getting approx 6-7hrs/night. Machine is working great, no other questions or concerns  She is pleased with her CPAP and comfortable with FFM. Husband here and agrees. She is being treated now for gingivitis/ stomatitis, possibly viral.  Does not think it has anything to do with  her CPAP mask.                        ROS-see HPI   + = positive Constitutional:    +weight loss, night sweats, fevers, chills, fatigue, lassitude. HEENT:    headaches, difficulty swallowing, tooth/dental problems, sore throat,       sneezing, itching, ear ache, nasal congestion, post nasal drip, snoring CV:    chest pain, orthopnea, PND, swelling in lower extremities, anasarca,     dizziness, palpitations Resp:   shortness of breath with exertion or at rest.                productive cough,   non-productive cough, coughing up of blood.              change in color of mucus.  wheezing.   Skin:    rash or lesions. GI:  No-   heartburn, indigestion, abdominal pain, nausea, vomiting, diarrhea,                 change in bowel habits, loss of appetite GU: dysuria, change in color of urine, no urgency or frequency.   flank pain. MS:   joint pain, stiffness, decreased range of motion, back pain. Neuro-     nothing unusual Psych:  change in mood or affect.  depression or anxiety.   memory loss.  OBJ- Physical Exam General- Alert, Oriented, Affect-appropriate, Distress- none acute Skin- rash-none, lesions- none, excoriation- none Lymphadenopathy- none Head- atraumatic            Eyes- Gross vision intact, PERRLA, conjunctivae and secretions clear            Ears- Hearing, canals-normal  Nose- Clear, no-Septal dev, mucus, polyps, erosion, perforation             Throat- Mallampati II I-IV, mucosa clear , drainage- none, tonsils- atrophic, +teeth Neck- flexible , trachea midline, no stridor , thyroid nl, carotid no bruit Chest - symmetrical excursion , unlabored           Heart/CV- RRR , no murmur , no gallop  , no rub, nl s1 s2                           - JVD- none , edema- none, stasis changes- none, varices- none           Lung- clear to P&A, wheeze- none, cough- none , dullness-none, rub- none           Chest wall-  Abd-  Br/ Gen/ Rectal- Not done, not indicated Extrem-  cyanosis- none, clubbing, none, atrophy- none, strength- nl Neuro- grossly intact to observation

## 2022-01-19 ENCOUNTER — Encounter: Payer: Self-pay | Admitting: Internal Medicine

## 2022-01-19 ENCOUNTER — Ambulatory Visit (INDEPENDENT_AMBULATORY_CARE_PROVIDER_SITE_OTHER): Payer: Medicare Other | Admitting: Internal Medicine

## 2022-01-19 DIAGNOSIS — I2583 Coronary atherosclerosis due to lipid rich plaque: Secondary | ICD-10-CM | POA: Diagnosis not present

## 2022-01-19 DIAGNOSIS — G4733 Obstructive sleep apnea (adult) (pediatric): Secondary | ICD-10-CM | POA: Diagnosis not present

## 2022-01-19 DIAGNOSIS — I2510 Atherosclerotic heart disease of native coronary artery without angina pectoris: Secondary | ICD-10-CM | POA: Diagnosis not present

## 2022-01-19 DIAGNOSIS — F339 Major depressive disorder, recurrent, unspecified: Secondary | ICD-10-CM

## 2022-01-19 NOTE — Assessment & Plan Note (Signed)
Benefits from CPAP with good compliance and control Plan- continue auto 5-15 

## 2022-01-19 NOTE — Assessment & Plan Note (Signed)
Better sleep is a help while this is managed elsewhere.

## 2022-01-19 NOTE — Assessment & Plan Note (Signed)
>>  ASSESSMENT AND PLAN FOR MAJOR DEPRESSION, RECURRENT, CHRONIC (HCC) WRITTEN ON 01/19/2022  2:28 PM BY YOUNG, CLINTON D, MD  Better sleep is a help while this is managed elsewhere.

## 2022-01-19 NOTE — Patient Instructions (Signed)
We can continue CPAP auto 5-20  Please call if we can help 

## 2022-01-21 DIAGNOSIS — Z23 Encounter for immunization: Secondary | ICD-10-CM | POA: Diagnosis not present

## 2022-01-22 ENCOUNTER — Other Ambulatory Visit (HOSPITAL_BASED_OUTPATIENT_CLINIC_OR_DEPARTMENT_OTHER): Payer: Self-pay | Admitting: Nurse Practitioner

## 2022-01-22 DIAGNOSIS — K130 Diseases of lips: Secondary | ICD-10-CM

## 2022-02-12 ENCOUNTER — Encounter (HOSPITAL_BASED_OUTPATIENT_CLINIC_OR_DEPARTMENT_OTHER): Payer: Self-pay | Admitting: Nurse Practitioner

## 2022-02-12 NOTE — Assessment & Plan Note (Signed)
Chronic.  Currently on statin therapy.  Labs pending.

## 2022-02-12 NOTE — Assessment & Plan Note (Signed)
Chronic.  Labs pending.  No new symptoms at this time.

## 2022-02-12 NOTE — Assessment & Plan Note (Signed)
Chronic.  Slight exacerbation of mood related to oral ulcers of unknown etiology of the pain associated with these.  We will continue to monitor closely.

## 2022-02-12 NOTE — Assessment & Plan Note (Signed)
>>  ASSESSMENT AND PLAN FOR CHRONIC KIDNEY DISEASE WRITTEN ON 02/12/2022  8:11 PM BY Amiylah Anastos E, NP  Chronic.  Labs pending.  Encourage monitoring protein intake and blood pressure and blood sugar control.

## 2022-02-12 NOTE — Assessment & Plan Note (Signed)
Chronic.  Blood glucose readings have been stable in the low 100s.  No alarm symptoms present at this time.  Labs pending.

## 2022-02-12 NOTE — Assessment & Plan Note (Signed)
Chronic.  Labs pending.  Encourage monitoring protein intake and blood pressure and blood sugar control.

## 2022-02-12 NOTE — Assessment & Plan Note (Signed)
Recurrent ulcerations to the oral mucosa.  Previously have resolved with consistent Diflucan treatment, however subsequent treatment was not effective.  Valacyclovir has not been effective.  Oral swab showed no evidence of bacteria on culture previously.  Symptoms are worsening and fairly severe in nature.  Will trial treatment with oral Keflex and mupirocin ointment.  Patient instructed to follow-up if no improvement over the next few days.

## 2022-02-18 ENCOUNTER — Other Ambulatory Visit (HOSPITAL_BASED_OUTPATIENT_CLINIC_OR_DEPARTMENT_OTHER): Payer: Self-pay | Admitting: Nurse Practitioner

## 2022-02-18 DIAGNOSIS — K1379 Other lesions of oral mucosa: Secondary | ICD-10-CM

## 2022-02-19 ENCOUNTER — Other Ambulatory Visit (HOSPITAL_BASED_OUTPATIENT_CLINIC_OR_DEPARTMENT_OTHER): Payer: Self-pay | Admitting: Nurse Practitioner

## 2022-02-19 DIAGNOSIS — F39 Unspecified mood [affective] disorder: Secondary | ICD-10-CM

## 2022-03-01 DIAGNOSIS — Z96611 Presence of right artificial shoulder joint: Secondary | ICD-10-CM | POA: Diagnosis not present

## 2022-03-01 DIAGNOSIS — Z471 Aftercare following joint replacement surgery: Secondary | ICD-10-CM | POA: Diagnosis not present

## 2022-03-01 DIAGNOSIS — M25511 Pain in right shoulder: Secondary | ICD-10-CM | POA: Diagnosis not present

## 2022-03-05 ENCOUNTER — Other Ambulatory Visit (HOSPITAL_BASED_OUTPATIENT_CLINIC_OR_DEPARTMENT_OTHER): Payer: Self-pay | Admitting: Nurse Practitioner

## 2022-03-05 DIAGNOSIS — K219 Gastro-esophageal reflux disease without esophagitis: Secondary | ICD-10-CM

## 2022-03-05 DIAGNOSIS — F4310 Post-traumatic stress disorder, unspecified: Secondary | ICD-10-CM

## 2022-03-05 DIAGNOSIS — F339 Major depressive disorder, recurrent, unspecified: Secondary | ICD-10-CM

## 2022-03-05 DIAGNOSIS — F411 Generalized anxiety disorder: Secondary | ICD-10-CM

## 2022-03-07 ENCOUNTER — Encounter (HOSPITAL_BASED_OUTPATIENT_CLINIC_OR_DEPARTMENT_OTHER): Payer: Self-pay | Admitting: Nurse Practitioner

## 2022-03-08 DIAGNOSIS — Z6832 Body mass index (BMI) 32.0-32.9, adult: Secondary | ICD-10-CM | POA: Diagnosis not present

## 2022-03-08 DIAGNOSIS — S20211A Contusion of right front wall of thorax, initial encounter: Secondary | ICD-10-CM | POA: Diagnosis not present

## 2022-03-08 DIAGNOSIS — E669 Obesity, unspecified: Secondary | ICD-10-CM | POA: Diagnosis not present

## 2022-03-15 DIAGNOSIS — G709 Myoneural disorder, unspecified: Secondary | ICD-10-CM | POA: Diagnosis not present

## 2022-03-15 DIAGNOSIS — I5030 Unspecified diastolic (congestive) heart failure: Secondary | ICD-10-CM | POA: Diagnosis not present

## 2022-03-15 DIAGNOSIS — N390 Urinary tract infection, site not specified: Secondary | ICD-10-CM | POA: Diagnosis not present

## 2022-03-15 DIAGNOSIS — I129 Hypertensive chronic kidney disease with stage 1 through stage 4 chronic kidney disease, or unspecified chronic kidney disease: Secondary | ICD-10-CM | POA: Diagnosis not present

## 2022-03-15 DIAGNOSIS — E039 Hypothyroidism, unspecified: Secondary | ICD-10-CM | POA: Diagnosis not present

## 2022-03-15 DIAGNOSIS — G4733 Obstructive sleep apnea (adult) (pediatric): Secondary | ICD-10-CM | POA: Diagnosis not present

## 2022-03-15 DIAGNOSIS — N1831 Chronic kidney disease, stage 3a: Secondary | ICD-10-CM | POA: Diagnosis not present

## 2022-03-15 DIAGNOSIS — E871 Hypo-osmolality and hyponatremia: Secondary | ICD-10-CM | POA: Diagnosis not present

## 2022-03-15 DIAGNOSIS — E785 Hyperlipidemia, unspecified: Secondary | ICD-10-CM | POA: Diagnosis not present

## 2022-03-15 LAB — PROTEIN / CREATININE RATIO, URINE
Creatinine, Urine: 64.5
EGFR: 36

## 2022-03-29 NOTE — Progress Notes (Unsigned)
NEUROLOGY FOLLOW UP OFFICE NOTE  REATHEL TURI [id number]  Assessment/Plan:   Migraine without aura, without status migrainosus, not intractable  Migraine prevention:  zonisamide 1104m daily Migraine rescue:  Ubrelvy 1014m(sent to her pharmacy). Rizatriptan as last resort. Limit use of pain relievers to no more than 2 days out of week to prevent risk of rebound or medication-overuse headache. Keep headache diary Follow up one year    Subjective:  SuKhalie Winces a [age]ear old right-handed female with hypothyroidism, depression, anxiety, OSA who follows up for migraines.   UPDATE: She was prescribed Ubrelvy but it was sent to a different pharamcy Intensity:  severe Duration:  90 min with rizatriptan, 30 minutes with UbRoselyn Meierrequency:  2 times a month Current NSAIDS/analgesics:  ASA 8155maily, Excedrin Migraine Current triptans:  none Current ergotamine:  none Current anti-emetic:  none Current muscle relaxants:  none Current Antihypertensive medications:  prazosin Current Antidepressant medications:  Cymbalta 68m68mily, Wellbutrin XL 150mg52mrent Anticonvulsant medications:  zonisamide 100mg 59mgabapentin 400mg Q46murrent anti-CGRP:  none Current Vitamins/Herbal/Supplements:  none Current Antihistamines/Decongestants:  none Other therapy:  none Hormone/birth control:  none Other medications:  Seroquel, Synthroid   Caffeine:  No coffee.  1 Diet Dr. Pepper Malachi BondsDiet:  Drinks water and Powerade.  Sometimes skips meals Exercise:  Walks  But not routine Depression:  yes; Anxiety:  yes Other pain:  Some shoulder pain. Sleep hygiene:  Poor.  Trouble staying asleep.  Has CPAP.    HISTORY:  Migraines since 10 year67old.  Over the last 3 months, they have increased in frequency.  No known aggravating factor.  They are are usually severe occipital pounding headaches associated with seeing black spots, nausea, vomiting, photophobia, phonophobia but no speech disturbance,  numbness or weakness.  They last 6 to 12 hours. They occur about 3 days a week.  No known triggers.  Laying down in dark quiet room helps.    She has longstanding history of seizure-like episodes with left sided jerking or confusion.  She has been previously worked up by my collManufacturing engineerquino,Delice Lescher events have been determined to be psychogenic.  Long-term EEG monitoring captured at least 3 events, which were normal.       Past NSAIDS/analgesics:  Ibuprofen, naproxen, Excedrin Migraine Past abortive triptans:  Sumatriptan 50mg, M29mt-MLT 10mg Pas34mortive ergotamine:  none Past muscle relaxants:  Flexeril, Robaxin, Zanflex Past anti-emetic:  Zofran Past antihypertensive medications:  Propranolol, Lasix Past antidepressant medications:  Amitriptyline 75mg, cit53mram Past anticonvulsant medications:  topiramate Past anti-CGRP:  none Past vitamins/Herbal/Supplements:  Melatonin, MVI Past antihistamines/decongestants:  Meclizine, Allegra Other past therapies:  none   Prior imaging of brain performed, personally reviewed: 02/12/2012:  Normal CT head. 08/09/2016 MRI BRAIN WO:  Negative MRI of brain for age.  No intracranial abnormality. 06/23/2017 CTA HEAD & NECK:  Minor atheromatous change at the carotid bifurcations. No flow-limiting extracranial or intracranial stenosis or dissection.  No acute intracranial findings. No abnormal postcontrast enhancement. 06/24/2017 MRI BRAIN WO:  Stable since 2016 and normal for age noncontrast MRI appearance of the brain.     Family history of headache:  No  PAST MEDICAL HISTORY: Past Medical History:  Diagnosis Date   Acute cystitis without hematuria    Acute encephalopathy 06/25/2016   Altered mental status 06/23/2017   Anxiety    Anxiety and depression 02/04/2012   Arthritis    Cataract 08/31/2017   left eye  CHF (congestive heart failure) ([place]) 2017   resolved   Cholesterol serum increased    Chronic back pain     chronic Rt low back pain. s/p L4-5 fusion. failed Rt facet injections. poss due to Rt SI joint dysfunction.   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   Depression    Diabetes mellitus without complication ([place])    Diastolic heart failure ([place]) 03/17/2012   Edema 02/04/2012   Encounter for long-term (current) use of medications 02/04/2012   Fall as cause of accidental injury at home as place of occurrence 09/08/2018   Gastroenteritis 01/13/2021   GERD (gastroesophageal reflux disease) 11/13/2015   Hallucination, visual 09/14/2017   Head injury 09/08/2018   Hyperlipidemia    Hyperparathyroidism ([place])    Hyperparathyroidism, secondary renal ([place]) 07/31/2014   [facility] [name], MD  Plan:  25 Hydroxy Vit D (59.1 ng/ml)    Hypothyroidism    Moderate episode of recurrent major depressive disorder ([place]) 08/31/2016   PONV (postoperative nausea and vomiting)    Seizures ([place])    3 years ago,2018   Sepsis ([place]) 04/29/2020   Sleep apnea    cpap   Syncope 08/30/2015   Syncope and collapse 02/12/2012   Tachycardia 02/04/2012   Trochanteric bursitis of both hips 2012   Confirmed on MRI    MEDICATIONS: Current Outpatient Medications on File Prior to Visit  Medication Sig Dispense Refill   albuterol (VENTOLIN HFA) 108 (90 Base) MCG/ACT inhaler TAKE 2 PUFFS BY MOUTH EVERY 6 HOURS AS NEEDED FOR WHEEZE OR SHORTNESS OF BREATH 18 g 6   ALPRAZolam (XANAX) 0.5 MG tablet TAKE 1 TABLET BY MOUTH 2 TIMES DAILY AS NEEDED FOR ANXIETY. 20 tablet 2   AMBULATORY NON FORMULARY MEDICATION Continuous positive airway pressure (CPAP) machine set at 15 cm of H2O pressure, with all supplemental supplies as needed. 1 each 0   AMBULATORY NON FORMULARY MEDICATION Continuous positive airway pressure (CPAP) machine set at 15 cm  of H2O pressure. Supplemental supplies: Headgear with q 6 months, Full face mask q 3 months, Full face cushion q 3 months, 1 standard tubing q 3 months, 1 water chamber q 6 months. 1 reusable filter q 6 months, 6 disposable filters q 3 months. Mask F-29. 1 each 0   aspirin EC 81 MG tablet Take 1 tablet (81 mg total) by mouth daily. 90 tablet 3   atorvastatin (LIPITOR) 20 MG tablet      blood glucose meter kit and supplies KIT Dispense based on patient and insurance preference. Use up to four times daily as directed. Check blood sugar three times weekly, then as needed. 1 each 0   buPROPion (WELLBUTRIN XL) 150 MG 24 hr tablet Take 1 tablet (150 mg total) by mouth daily. 90 tablet 3   busPIRone (BUSPAR) 7.5 MG tablet Take 2 tablets (15 mg total) by mouth 3 (three) times daily. 540 tablet 3   carvedilol (COREG) 3.125 MG tablet Take 1 tablet (3.125 mg total) by mouth 2 (two) times daily. 180 tablet 3   cephALEXin (KEFLEX) 500 MG capsule Take 1 capsule (500 mg total) by mouth 3 (three) times daily. 30 capsule 0   cetirizine (ZYRTEC) 10 MG tablet Take 1 tablet (10 mg total) by mouth daily. 90 tablet 3   dexamethasone 0.5 MG/5ML elixir Take 10  mLs (1 mg total) by mouth 3 (three) times daily. Rinse and spit. Avoiding eating or drinking for 30 minutes. 237 mL 3   diclofenac sodium (VOLTAREN) 1 % GEL Apply 2 g topically daily as needed (for knee pain). 100 g 3   DULoxetine (CYMBALTA) 60 MG capsule Take 1 capsule (60 mg total) by mouth daily. 90 capsule 3   famotidine (PEPCID) 20 MG tablet Take 1 tablet (20 mg total) by mouth 2 (two) times daily. 180 tablet 3   fluconazole (DIFLUCAN) 150 MG tablet Take one tablet by mouth at the first sign of symptoms of yeast. If no resolution, repeat dose in 72 hours. 2 tablet 2   fluticasone (FLONASE) 50 MCG/ACT nasal spray PLACE 2 SPRAYS AT BEDTIME INTO BOTH NOSTRILS. 16 g 5   gabapentin (NEURONTIN) 600 MG tablet Take 1 tablet (600 mg total) by mouth every morning AND 1.5  tablets (900 mg total) at bedtime. 225 tablet 3   hydrOXYzine (VISTARIL) 50 MG capsule TAKE 1 CAPSULE (50 MG TOTAL) BY MOUTH EVERY 6 (SIX) HOURS AS NEEDED. 360 capsule 2   levothyroxine (SYNTHROID) 50 MCG tablet Take 1 tablet (50 mcg total) by mouth every other day. Before breakfast. Alternate with 32mg tablet 90 tablet 1   levothyroxine (SYNTHROID) 75 MCG tablet Take 1 tablet (75 mcg total) by mouth every other day. Before breakfast. Alternate with 511m tablet 90 tablet 1   meclizine (ANTIVERT) 50 MG tablet Take 0.5 tablets (25 mg total) by mouth 2 (two) times daily as needed for dizziness. (Patient taking differently: Take 50 mg by mouth 2 (two) times daily as needed for dizziness.) 60 tablet 3   metFORMIN (GLUCOPHAGE) 500 MG tablet Take 1 tablet (500 mg total) by mouth 2 (two) times daily with a meal. 180 tablet 3   metoCLOPramide (REGLAN) 10 MG tablet TAKE 0.5 TABLET BY MOUTH 3 (THREE) TIMES DAILY AS NEEDED FOR NAUSEA OR VOMITING (NAUSEA/REFLUX). 30 tablet 3   Multiple Vitamins-Minerals (MULTIVITAMIN WITH MINERALS) tablet Take 1 tablet by mouth daily.     mupirocin ointment (BACTROBAN) 2 % Apply to affected area TID for 2 weeks 30 g 3   nystatin ointment (MYCOSTATIN) Apply 1 application. topically 2 (two) times daily. 30 g 1   ondansetron (ZOFRAN) 8 MG tablet TAKE 1 TABLET BY MOUTH EVERY 8 HOURS AS NEEDED FOR NAUSEA OR VOMITING. 30 tablet 2   pantoprazole (PROTONIX) 40 MG tablet Take 1 tablet (40 mg total) by mouth 2 (two) times daily. 180 tablet 3   Polyvinyl Alcohol-Povidone PF (REFRESH) 1.4-0.6 % SOLN Place 1 drop into both eyes daily as needed (dry eyes).     Probiotic Product (ALIGN) 4 MG CAPS Take 1 capsule (4 mg total) by mouth daily. 30 capsule 0   promethazine (PHENERGAN) 25 MG suppository Place 1 suppository (25 mg total) rectally every 6 (six) hours as needed for nausea or vomiting. 12 each 3   QUEtiapine (SEROQUEL) 300 MG tablet Take 1 tablet (300 mg total) by mouth at bedtime. TAKE  1 TABLET BY MOUTH EVERYDAY AT BEDTIME 90 tablet 3   rizatriptan (MAXALT-MLT) 10 MG disintegrating tablet Take 1 tablet (10 mg total) by mouth as needed for migraine. May repeat in 2 hours if needed 36 tablet 3   trolamine salicylate (ASPERCREME) 10 % cream Apply 1 application topically as needed for muscle pain.     Ubrogepant (UBRELVY) 100 MG TABS Take 1 tablet by mouth as needed (May repeat in 2 years.  Maximum 2  tablets in 24 hours.). 10 tablet 5   valACYclovir (VALTREX) 500 MG tablet Take 1 tablet (500 mg total) by mouth daily. 90 tablet 3   zonisamide (ZONEGRAN) 100 MG capsule TAKE 1 CAPSULE BY MOUTH EVERY DAY 90 capsule 1   No current facility-administered medications on file prior to visit.    ALLERGIES: Allergies  Allergen Reactions   Tramadol Nausea And Vomiting   Trazodone And Nefazodone Other (See Comments)    Per pt trazodone caused hallucinations and behavior changes     FAMILY HISTORY: Family History  Problem Relation Age of Onset   Diabetes Mother    Heart disease Mother    Kidney disease Mother    Thyroid disease Mother    Heart disease Father    Seizures Father    COPD Father    Irritable bowel syndrome Father    Cancer Maternal Grandmother    Diabetes Sister    Diabetes Brother    Colon cancer Neg Hx    Stomach cancer Neg Hx    Colon polyps Neg Hx    Esophageal cancer Neg Hx    Rectal cancer Neg Hx       Objective:  Blood pressure 116/64, pulse 100, height _0  (1.651 m), weight 175 lb 6.4 oz (79.6 kg), SpO2 97 %. General: No acute distress.  Patient appears well-groomed.   Head:  Normocephalic/atraumatic Eyes:  Fundi examined but not visualized Neck: supple, no paraspinal tenderness, full range of motion Heart:  Regular rate and rhythm Neurological Exam: alert and oriented to person, place, and time.  Speech fluent and not dysarthric, language intact.  CN II-XII intact. Bulk and tone normal, muscle strength 5/5 throughout.  Sensation to light touch  intact.  Deep tendon reflexes 2+ throughout, toes donwgoing.  Finger to nose testing intact.  Gait normal, Romberg negative.   Metta Clines, DO  CC: Jacolyn Reedy, NP

## 2022-03-30 ENCOUNTER — Encounter: Payer: Self-pay | Admitting: Neurology

## 2022-03-30 ENCOUNTER — Ambulatory Visit (INDEPENDENT_AMBULATORY_CARE_PROVIDER_SITE_OTHER): Payer: Medicare Other | Admitting: Neurology

## 2022-03-30 ENCOUNTER — Other Ambulatory Visit: Payer: Self-pay | Admitting: Neurology

## 2022-03-30 DIAGNOSIS — I2583 Coronary atherosclerosis due to lipid rich plaque: Secondary | ICD-10-CM | POA: Diagnosis not present

## 2022-03-30 DIAGNOSIS — G43709 Chronic migraine without aura, not intractable, without status migrainosus: Secondary | ICD-10-CM | POA: Diagnosis not present

## 2022-03-30 DIAGNOSIS — I2510 Atherosclerotic heart disease of native coronary artery without angina pectoris: Secondary | ICD-10-CM | POA: Diagnosis not present

## 2022-03-30 DIAGNOSIS — Z23 Encounter for immunization: Secondary | ICD-10-CM | POA: Diagnosis not present

## 2022-03-30 MED ORDER — RIZATRIPTAN BENZOATE 10 MG PO TBDP: 10.0000 mg | ORAL_TABLET | ORAL | 3 refills | Status: DC | PRN

## 2022-03-30 MED ORDER — ZONISAMIDE 100 MG PO CAPS: ORAL_CAPSULE | ORAL | 3 refills | Status: DC

## 2022-03-30 MED ORDER — UBRELVY 100 MG PO TABS: 1.0000 | ORAL_TABLET | ORAL | 3 refills | Status: DC | PRN

## 2022-03-30 NOTE — Patient Instructions (Signed)
Refilled zonisamide, Ubrelvy and rizatriptan. Try to only use Bernita Raisin

## 2022-03-31 ENCOUNTER — Other Ambulatory Visit (HOSPITAL_COMMUNITY): Payer: Self-pay

## 2022-03-31 ENCOUNTER — Other Ambulatory Visit: Payer: Self-pay | Admitting: Neurology

## 2022-03-31 ENCOUNTER — Telehealth: Payer: Self-pay

## 2022-03-31 MED ORDER — NURTEC 75 MG PO TBDP: 75.0000 mg | ORAL_TABLET | Freq: Every day | ORAL | 5 refills | Status: DC | PRN

## 2022-03-31 NOTE — Telephone Encounter (Signed)
Patient Advocate Encounter   Received notification from [facility] that prior authorization is required for Ubrelvy 100MG  tablets  Submitted: 03-31-2022 04-02-2022 100MG  tablets  Status is pending

## 2022-03-31 NOTE — Telephone Encounter (Signed)
PA has been started in another encounter. 

## 2022-03-31 NOTE — Telephone Encounter (Signed)
Received a fax from [employer] regarding Prior Authorization for Ubrelvy 100MG  tablets .   Authorization has been DENIED due to the requested drug is not on your olan's formulary (list of covered drugs). To receive a formulary exception, your prescriber must provide information that documents at least one of the following has occurred:  - You have tried the formulary drugs for the treatment of your condition and they did not work for you. OR -The formuary drugs could cause adverse effects. OR - The formulary drugs would be less effective for your condition than the requested drug.  Listed are the covered alternative(s).    Determination letter is attached to patient's chart.

## 2022-04-01 ENCOUNTER — Telehealth: Payer: Self-pay

## 2022-04-01 ENCOUNTER — Other Ambulatory Visit (HOSPITAL_COMMUNITY): Payer: Self-pay

## 2022-04-01 NOTE — Telephone Encounter (Signed)
Patient Advocate Encounter   Received notification from [employer] that prior authorization is required for Nurtec 75MG  dispersible tablets  Submitted: 04-01-2022 Key [id number]  Status is pending

## 2022-04-01 NOTE — Telephone Encounter (Signed)
Please let patient know that she needs to try a different rescue medication before Bernita Raisin will be considered by her insurance.  I sent a prescription to [facility] on [address] for Nurtec - 1 tablet daily as needed.  Will likely need prior authorization as well but will get approved as they suggested it.  This is also a good medication, however, and she may prefer the Nurtec instead.     Tried calling patient, no answer LMOVM.

## 2022-04-01 NOTE — Telephone Encounter (Signed)
Patient Advocate Encounter  Prior Authorization for Nurtec 75MG  dispersible tablets has been approved.    Effective: 04-01-2022 to 04-01-2023

## 2022-04-06 NOTE — Telephone Encounter (Signed)
Tried calling patient no answer, LMOVM to call the office back holiday hours given.

## 2022-04-09 ENCOUNTER — Other Ambulatory Visit (HOSPITAL_BASED_OUTPATIENT_CLINIC_OR_DEPARTMENT_OTHER): Payer: Self-pay | Admitting: Nurse Practitioner

## 2022-04-09 DIAGNOSIS — F39 Unspecified mood [affective] disorder: Secondary | ICD-10-CM

## 2022-04-12 NOTE — Telephone Encounter (Signed)
LMOV for patient to call the office back.

## 2022-04-20 ENCOUNTER — Ambulatory Visit (INDEPENDENT_AMBULATORY_CARE_PROVIDER_SITE_OTHER): Payer: Medicare Other | Admitting: Nurse Practitioner

## 2022-04-20 ENCOUNTER — Encounter: Payer: Self-pay | Admitting: Nurse Practitioner

## 2022-04-20 VITALS — BP 120/76 | HR 84 | Temp 98.5°F | Wt 176.8 lb

## 2022-04-20 DIAGNOSIS — H66003 Acute suppurative otitis media without spontaneous rupture of ear drum, bilateral: Secondary | ICD-10-CM | POA: Diagnosis not present

## 2022-04-20 MED ORDER — AZITHROMYCIN 250 MG PO TABS: ORAL_TABLET | ORAL | 0 refills | Status: AC

## 2022-04-20 MED ORDER — FLUCONAZOLE 150 MG PO TABS: ORAL_TABLET | ORAL | 2 refills | Status: DC

## 2022-04-20 NOTE — Progress Notes (Signed)
Jennifer Eth, DNP, AGNP-c [facility] [address] [place], [place] [address] [phone]  Subjective:   Jennifer Davidson is a 66 y.o. female presents to day for evaluation of: Dizziness [name] endorses intermittent episodes of dizziness that started a couple of weeks ago.  She tells me she feels like the room is spinning around.  Nothing seems to make it worse and nothing is making it better.  She has tried meclizine which has worked in the past however it is not working at this time.  She does endorse sinus pressure in her face and puffiness under her eyes.  She has been monitoring her blood sugar and blood pressure during the episodes and they have been normal.  She denies any vision changes, weakness, slurred speech, difficulty swallowing during these episodes. [facility]- x-ray results  PMH, Medications, and Allergies reviewed and updated in chart as appropriate.   ROS negative except for what is listed in HPI. Objective:  BP 120/76   Pulse 84   Temp 98.5 F (36.9 C)   Wt 176 lb 12.8 oz (80.2 kg)   BMI 29.42 kg/m  Physical Exam Vitals and nursing note reviewed.  Constitutional:      Appearance: She is ill-appearing.  HENT:     Head: Normocephalic.     Right Ear: A middle ear effusion is present. Tympanic membrane is erythematous and bulging.     Left Ear: A middle ear effusion is present. Tympanic membrane is erythematous and bulging.     Nose: Congestion present.     Right Turbinates: Enlarged.     Left Turbinates: Enlarged.     Right Sinus: Frontal sinus tenderness present.     Left Sinus: Frontal sinus tenderness present.     Mouth/Throat:     Mouth: Mucous membranes are moist.  Cardiovascular:     Rate and Rhythm: Normal rate and regular rhythm.     Pulses: Normal pulses.     Heart sounds: Normal heart sounds.  Pulmonary:     Effort: Pulmonary effort is normal.     Breath sounds: Normal breath sounds.  Lymphadenopathy:     Cervical:  Cervical adenopathy present.  Skin:    General: Skin is warm and dry.     Capillary Refill: Capillary refill takes less than 2 seconds.  Neurological:     General: No focal deficit present.     Mental Status: She is alert.     Cranial Nerves: No cranial nerve deficit.     Sensory: No sensory deficit.     Motor: No weakness.     Coordination: Coordination normal.     Gait: Gait normal.  Psychiatric:        Mood and Affect: Mood normal.         Assessment & Plan:   Problem List Items Addressed This Visit     Non-recurrent acute suppurative otitis media of both ears without spontaneous rupture of tympanic membranes - Primary    Dizziness in the setting of bilateral purulent effusion of the ears consistent with otitis media.  Suspect that sinusitis has contributed to this.  Will send treatment today with antibiotic therapy.  Will add Diflucan as she does typically get a yeast infection after utilizing antibiotic therapy she is aware of her symptoms worsen or fail to improve to contact me for further instructions.  No alarm symptoms are present at this time.      Relevant Medications   fluconazole (DIFLUCAN) 150 MG tablet  Jennifer Eth, DNP, AGNP-c 05/05/2022  7:12 PM    History, Medications, Surgery, SDOH, and Family History reviewed and updated as appropriate.

## 2022-04-20 NOTE — Patient Instructions (Addendum)
I have sent in an antibiotic for you. You have an infection in the left ear and one trying to start in the right. If it doesn't get better or the dizziness continues, please let me know.   Please try to let your worries and stress fall into your "trashbag".

## 2022-04-28 ENCOUNTER — Telehealth: Payer: Self-pay | Admitting: Nurse Practitioner

## 2022-04-28 NOTE — Telephone Encounter (Signed)
Received requested [facility] Records. Sending back to [facility] for review

## 2022-04-29 DIAGNOSIS — K1379 Other lesions of oral mucosa: Secondary | ICD-10-CM | POA: Diagnosis not present

## 2022-05-05 ENCOUNTER — Encounter: Payer: Self-pay | Admitting: Nurse Practitioner

## 2022-05-05 DIAGNOSIS — H66003 Acute suppurative otitis media without spontaneous rupture of ear drum, bilateral: Secondary | ICD-10-CM | POA: Insufficient documentation

## 2022-05-05 DIAGNOSIS — H66001 Acute suppurative otitis media without spontaneous rupture of ear drum, right ear: Secondary | ICD-10-CM | POA: Insufficient documentation

## 2022-05-05 HISTORY — DX: Acute suppurative otitis media without spontaneous rupture of ear drum, right ear: H66.001

## 2022-05-05 NOTE — Assessment & Plan Note (Signed)
Dizziness in the setting of bilateral purulent effusion of the ears consistent with otitis media.  Suspect that sinusitis has contributed to this.  Will send treatment today with antibiotic therapy.  Will add Diflucan as she does typically get a yeast infection after utilizing antibiotic therapy she is aware of her symptoms worsen or fail to improve to contact me for further instructions.  No alarm symptoms are present at this time.

## 2022-05-07 ENCOUNTER — Ambulatory Visit: Payer: Medicare Other | Admitting: Nurse Practitioner

## 2022-05-15 ENCOUNTER — Other Ambulatory Visit (HOSPITAL_BASED_OUTPATIENT_CLINIC_OR_DEPARTMENT_OTHER): Payer: Self-pay | Admitting: Nurse Practitioner

## 2022-05-15 DIAGNOSIS — F39 Unspecified mood [affective] disorder: Secondary | ICD-10-CM

## 2022-05-20 ENCOUNTER — Telehealth: Payer: Self-pay | Admitting: Nurse Practitioner

## 2022-05-20 NOTE — Telephone Encounter (Signed)
Pt called and left a message stating that she needed a refill on xanax. Prior to calling pt back I spoke to [place] and she states that pt received 20 on 04/12/2022 had a refill available for 20 on 05/12/2022 and should have one additional for 20 available for 06/12/2022. Pt was advised. PT states that pharmacy is telling her she doesn't have a refill. I asked pt if she had gotten 2 additional refills since she picked up the original 20 in November and she stated that she had. I advised pt that if she needed any more medication she would need to make an appointment to discuss with SaraBeth. Pt declined .

## 2022-05-28 ENCOUNTER — Encounter: Payer: Self-pay | Admitting: Internal Medicine

## 2022-06-01 ENCOUNTER — Encounter: Payer: Self-pay | Admitting: Nurse Practitioner

## 2022-06-21 ENCOUNTER — Encounter: Payer: Self-pay | Admitting: Nurse Practitioner

## 2022-06-21 ENCOUNTER — Other Ambulatory Visit (HOSPITAL_BASED_OUTPATIENT_CLINIC_OR_DEPARTMENT_OTHER): Payer: Self-pay | Admitting: Nurse Practitioner

## 2022-06-21 DIAGNOSIS — F515 Nightmare disorder: Secondary | ICD-10-CM

## 2022-06-21 DIAGNOSIS — F331 Major depressive disorder, recurrent, moderate: Secondary | ICD-10-CM

## 2022-06-21 DIAGNOSIS — F411 Generalized anxiety disorder: Secondary | ICD-10-CM

## 2022-06-21 DIAGNOSIS — F339 Major depressive disorder, recurrent, unspecified: Secondary | ICD-10-CM

## 2022-06-21 DIAGNOSIS — F4310 Post-traumatic stress disorder, unspecified: Secondary | ICD-10-CM

## 2022-06-21 DIAGNOSIS — F39 Unspecified mood [affective] disorder: Secondary | ICD-10-CM

## 2022-06-21 NOTE — Telephone Encounter (Signed)
Refill request last apt 04/20/22.

## 2022-06-22 DIAGNOSIS — M7061 Trochanteric bursitis, right hip: Secondary | ICD-10-CM | POA: Diagnosis not present

## 2022-06-24 DIAGNOSIS — R42 Dizziness and giddiness: Secondary | ICD-10-CM | POA: Insufficient documentation

## 2022-06-24 DIAGNOSIS — K1379 Other lesions of oral mucosa: Secondary | ICD-10-CM | POA: Diagnosis not present

## 2022-06-24 DIAGNOSIS — H8110 Benign paroxysmal vertigo, unspecified ear: Secondary | ICD-10-CM | POA: Insufficient documentation

## 2022-06-26 ENCOUNTER — Other Ambulatory Visit (HOSPITAL_BASED_OUTPATIENT_CLINIC_OR_DEPARTMENT_OTHER): Payer: Self-pay | Admitting: Nurse Practitioner

## 2022-06-26 DIAGNOSIS — R7303 Prediabetes: Secondary | ICD-10-CM

## 2022-06-26 DIAGNOSIS — T7840XD Allergy, unspecified, subsequent encounter: Secondary | ICD-10-CM

## 2022-07-06 ENCOUNTER — Telehealth: Payer: Self-pay | Admitting: Nurse Practitioner

## 2022-07-06 NOTE — Telephone Encounter (Signed)
Contacted Jennifer Davidson to schedule their annual wellness visit. Appointment made for 07/20/22.  Jennifer Davidson AWV direct phone # [phone]

## 2022-07-06 NOTE — Telephone Encounter (Signed)
Called patient to schedule Medicare Annual Wellness Visit (AWV). Left message for patient to call back and schedule Medicare Annual Wellness Visit (AWV).  Last date of AWV: 08/21/20  Please schedule an appointment at any time with [facility]  If any questions, please contact me at [phone].  Thank you ,  Barkley Boards AWV direct phone # [phone]

## 2022-07-08 DIAGNOSIS — M545 Low back pain, unspecified: Secondary | ICD-10-CM | POA: Diagnosis not present

## 2022-07-08 DIAGNOSIS — M5441 Lumbago with sciatica, right side: Secondary | ICD-10-CM | POA: Diagnosis not present

## 2022-07-12 ENCOUNTER — Ambulatory Visit (HOSPITAL_BASED_OUTPATIENT_CLINIC_OR_DEPARTMENT_OTHER): Payer: Medicare Other | Admitting: Nurse Practitioner

## 2022-07-19 ENCOUNTER — Other Ambulatory Visit: Payer: Self-pay | Admitting: Nurse Practitioner

## 2022-07-20 ENCOUNTER — Other Ambulatory Visit (HOSPITAL_BASED_OUTPATIENT_CLINIC_OR_DEPARTMENT_OTHER): Payer: Self-pay | Admitting: Nurse Practitioner

## 2022-07-20 ENCOUNTER — Ambulatory Visit (INDEPENDENT_AMBULATORY_CARE_PROVIDER_SITE_OTHER): Payer: Medicare Other

## 2022-07-20 VITALS — BP 138/80 | HR 100 | Temp 97.4°F | Ht 62.5 in | Wt 178.2 lb

## 2022-07-20 DIAGNOSIS — E038 Other specified hypothyroidism: Secondary | ICD-10-CM

## 2022-07-20 DIAGNOSIS — Z0000 Encounter for general adult medical examination without abnormal findings: Secondary | ICD-10-CM
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
## 2022-07-20 NOTE — Patient Instructions (Addendum)
[name] , Thank you for taking time to come for your Medicare Wellness Visit. I appreciate your ongoing commitment to your health goals. Please review the following plan we discussed and let me know if I can assist you in the future.   These are the goals we discussed:  Goals      Exercise 3x per week (30 min per time)     Walking more starting at 20 minutes per day.     Patient Stated     07/20/2022, wants to feel better     Weight (lb) < 200 lb (90.7 kg)        This is a list of the screening recommended for you and due dates:  Health Maintenance  Topic Date Due   COVID-19 Vaccine (6 - 2023-24 season) 05/25/2022   Medicare Annual Wellness Visit  07/20/2023   Mammogram  12/23/2023   Pneumonia Vaccine (3 of 3 - PPSV23 or PCV20) 02/19/2024   Colon Cancer Screening  07/09/2029   DTaP/Tdap/Td vaccine (2 - Td or Tdap) 09/19/2030   Flu Shot  Completed   DEXA scan (bone density measurement)  Completed   Zoster (Shingles) Vaccine  Completed   HPV Vaccine  Aged Out   Hepatitis C Screening: USPSTF Recommendation to screen - Ages 62-79 yo.  Discontinued    Advanced directives: Advance directive discussed with you today. Even though you declined this today please call our office should you change your mind and we can give you the proper paperwork for you to fill out.  Conditions/risks identified: none  Next appointment: Follow up in one year for your annual wellness visit    Preventive Care 65 Years and Older, Female Preventive care refers to lifestyle choices and visits with your health care provider that can promote health and wellness. What does preventive care include? A yearly physical exam. This is also called an annual well check. Dental exams once or twice a year. Routine eye exams. Ask your health care provider how often you should have your eyes checked. Personal lifestyle choices, including: Daily care of your teeth and gums. Regular physical activity. Eating a healthy  diet. Avoiding tobacco and drug use. Limiting alcohol use. Practicing safe sex. Taking low-dose aspirin every day. Taking vitamin and mineral supplements as recommended by your health care provider. What happens during an annual well check? The services and screenings done by your health care provider during your annual well check will depend on your age, overall health, lifestyle risk factors, and family history of disease. Counseling  Your health care provider may ask you questions about your: Alcohol use. Tobacco use. Drug use. Emotional well-being. Home and relationship well-being. Sexual activity. Eating habits. History of falls. Memory and ability to understand (cognition). Work and work Statistician. Reproductive health. Screening  You may have the following tests or measurements: Height, weight, and BMI. Blood pressure. Lipid and cholesterol levels. These may be checked every 5 years, or more frequently if you are over 89 years old. Skin check. Lung cancer screening. You may have this screening every year starting at age 36 if you have a 30-pack-year history of smoking and currently smoke or have quit within the past 15 years. Fecal occult blood test (FOBT) of the stool. You may have this test every year starting at age 59. Flexible sigmoidoscopy or colonoscopy. You may have a sigmoidoscopy every 5 years or a colonoscopy every 10 years starting at age 53. Hepatitis C blood test. Hepatitis B blood test. Sexually  transmitted disease (STD) testing. Diabetes screening. This is done by checking your blood sugar (glucose) after you have not eaten for a while (fasting). You may have this done every 1-3 years. Bone density scan. This is done to screen for osteoporosis. You may have this done starting at age 5. Mammogram. This may be done every 1-2 years. Talk to your health care provider about how often you should have regular mammograms. Talk with your health care provider about  your test results, treatment options, and if necessary, the need for more tests. Vaccines  Your health care provider may recommend certain vaccines, such as: Influenza vaccine. This is recommended every year. Tetanus, diphtheria, and acellular pertussis (Tdap, Td) vaccine. You may need a Td booster every 10 years. Zoster vaccine. You may need this after age 58. Pneumococcal 13-valent conjugate (PCV13) vaccine. One dose is recommended after age 22. Pneumococcal polysaccharide (PPSV23) vaccine. One dose is recommended after age 55. Talk to your health care provider about which screenings and vaccines you need and how often you need them. This information is not intended to replace advice given to you by your health care provider. Make sure you discuss any questions you have with your health care provider. Document Released: 05/30/2015 Document Revised: 01/21/2016 Document Reviewed: 03/04/2015 Elsevier Interactive Patient Education  2017 [place] Prevention in the Home Falls can cause injuries. They can happen to people of all ages. There are many things you can do to make your home safe and to help prevent falls. What can I do on the outside of my home? Regularly fix the edges of walkways and driveways and fix any cracks. Remove anything that might make you trip as you walk through a door, such as a raised step or threshold. Trim any bushes or trees on the path to your home. Use bright outdoor lighting. Clear any walking paths of anything that might make someone trip, such as rocks or tools. Regularly check to see if handrails are loose or broken. Make sure that both sides of any steps have handrails. Any raised decks and porches should have guardrails on the edges. Have any leaves, snow, or ice cleared regularly. Use sand or salt on walking paths during winter. Clean up any spills in your garage right away. This includes oil or grease spills. What can I do in the bathroom? Use  night lights. Install grab bars by the toilet and in the tub and shower. Do not use towel bars as grab bars. Use non-skid mats or decals in the tub or shower. If you need to sit down in the shower, use a plastic, non-slip stool. Keep the floor dry. Clean up any water that spills on the floor as soon as it happens. Remove soap buildup in the tub or shower regularly. Attach bath mats securely with double-sided non-slip rug tape. Do not have throw rugs and other things on the floor that can make you trip. What can I do in the bedroom? Use night lights. Make sure that you have a light by your bed that is easy to reach. Do not use any sheets or blankets that are too big for your bed. They should not hang down onto the floor. Have a firm chair that has side arms. You can use this for support while you get dressed. Do not have throw rugs and other things on the floor that can make you trip. What can I do in the kitchen? Clean up any spills right away. Avoid  walking on wet floors. Keep items that you use a lot in easy-to-reach places. If you need to reach something above you, use a strong step stool that has a grab bar. Keep electrical cords out of the way. Do not use floor polish or wax that makes floors slippery. If you must use wax, use non-skid floor wax. Do not have throw rugs and other things on the floor that can make you trip. What can I do with my stairs? Do not leave any items on the stairs. Make sure that there are handrails on both sides of the stairs and use them. Fix handrails that are broken or loose. Make sure that handrails are as long as the stairways. Check any carpeting to make sure that it is firmly attached to the stairs. Fix any carpet that is loose or worn. Avoid having throw rugs at the top or bottom of the stairs. If you do have throw rugs, attach them to the floor with carpet tape. Make sure that you have a light switch at the top of the stairs and the bottom of the  stairs. If you do not have them, ask someone to add them for you. What else can I do to help prevent falls? Wear shoes that: Do not have high heels. Have rubber bottoms. Are comfortable and fit you well. Are closed at the toe. Do not wear sandals. If you use a stepladder: Make sure that it is fully opened. Do not climb a closed stepladder. Make sure that both sides of the stepladder are locked into place. Ask someone to hold it for you, if possible. Clearly mark and make sure that you can see: Any grab bars or handrails. First and last steps. Where the edge of each step is. Use tools that help you move around (mobility aids) if they are needed. These include: Canes. Walkers. Scooters. Crutches. Turn on the lights when you go into a dark area. Replace any light bulbs as soon as they burn out. Set up your furniture so you have a clear path. Avoid moving your furniture around. If any of your floors are uneven, fix them. If there are any pets around you, be aware of where they are. Review your medicines with your doctor. Some medicines can make you feel dizzy. This can increase your chance of falling. Ask your doctor what other things that you can do to help prevent falls. This information is not intended to replace advice given to you by your health care provider. Make sure you discuss any questions you have with your health care provider. Document Released: 02/27/2009 Document Revised: 10/09/2015 Document Reviewed: 06/07/2014 Elsevier Interactive Patient Education  2017 [employer].

## 2022-07-21 ENCOUNTER — Telehealth: Payer: Self-pay | Admitting: Nurse Practitioner

## 2022-07-21 DIAGNOSIS — F39 Unspecified mood [affective] disorder: Secondary | ICD-10-CM

## 2022-07-21 MED ORDER — ALPRAZOLAM 0.5 MG PO TABS: ORAL_TABLET | ORAL | 5 refills | Status: DC

## 2022-07-21 NOTE — Telephone Encounter (Signed)
-----   Message from Kellie Simmering, LPN sent at [phone]  1:55 PM EST ----- Regarding: refill Hello Jennifer Davidson, Jennifer Davidson is requesting a refill of the xanax. Thanks

## 2022-07-22 ENCOUNTER — Encounter: Payer: Self-pay | Admitting: Nurse Practitioner

## 2022-07-22 ENCOUNTER — Other Ambulatory Visit: Payer: Self-pay | Admitting: Nurse Practitioner

## 2022-07-22 DIAGNOSIS — F39 Unspecified mood [affective] disorder: Secondary | ICD-10-CM

## 2022-07-22 MED ORDER — ALPRAZOLAM 0.5 MG PO TABS: ORAL_TABLET | ORAL | 5 refills | Status: DC

## 2022-07-23 ENCOUNTER — Other Ambulatory Visit (HOSPITAL_BASED_OUTPATIENT_CLINIC_OR_DEPARTMENT_OTHER): Payer: Self-pay | Admitting: Nurse Practitioner

## 2022-07-23 DIAGNOSIS — F39 Unspecified mood [affective] disorder: Secondary | ICD-10-CM

## 2022-07-23 DIAGNOSIS — F411 Generalized anxiety disorder: Secondary | ICD-10-CM

## 2022-07-23 DIAGNOSIS — F515 Nightmare disorder: Secondary | ICD-10-CM

## 2022-07-23 DIAGNOSIS — G5791 Unspecified mononeuropathy of right lower limb: Secondary | ICD-10-CM

## 2022-07-23 DIAGNOSIS — F339 Major depressive disorder, recurrent, unspecified: Secondary | ICD-10-CM

## 2022-07-23 DIAGNOSIS — F4310 Post-traumatic stress disorder, unspecified: Secondary | ICD-10-CM

## 2022-07-23 DIAGNOSIS — F331 Major depressive disorder, recurrent, moderate: Secondary | ICD-10-CM

## 2022-07-23 NOTE — Telephone Encounter (Signed)
Refill request last apt 04/20/22.

## 2022-07-28 ENCOUNTER — Other Ambulatory Visit (HOSPITAL_BASED_OUTPATIENT_CLINIC_OR_DEPARTMENT_OTHER): Payer: Self-pay | Admitting: Nurse Practitioner

## 2022-07-28 DIAGNOSIS — G5791 Unspecified mononeuropathy of right lower limb: Secondary | ICD-10-CM

## 2022-07-28 DIAGNOSIS — G43709 Chronic migraine without aura, not intractable, without status migrainosus: Secondary | ICD-10-CM

## 2022-07-28 NOTE — Telephone Encounter (Signed)
Refill request last apt 04/20/22.

## 2022-08-03 DIAGNOSIS — M25551 Pain in right hip: Secondary | ICD-10-CM | POA: Diagnosis not present

## 2022-08-03 DIAGNOSIS — M5441 Lumbago with sciatica, right side: Secondary | ICD-10-CM | POA: Diagnosis not present

## 2022-08-04 ENCOUNTER — Telehealth: Payer: Self-pay | Admitting: Nurse Practitioner

## 2022-08-04 NOTE — Telephone Encounter (Signed)
Pt husband called and states she needs a refill on her carvedilol and they prefer [id number] - [place], [place] [address]

## 2022-08-05 ENCOUNTER — Encounter: Payer: Self-pay | Admitting: Nurse Practitioner

## 2022-08-05 ENCOUNTER — Other Ambulatory Visit: Payer: Self-pay

## 2022-08-05 DIAGNOSIS — K219 Gastro-esophageal reflux disease without esophagitis: Secondary | ICD-10-CM

## 2022-08-05 DIAGNOSIS — R110 Nausea: Secondary | ICD-10-CM

## 2022-08-05 MED ORDER — METOCLOPRAMIDE HCL 10 MG PO TABS: ORAL_TABLET | ORAL | 3 refills | Status: DC

## 2022-08-05 MED ORDER — ONDANSETRON HCL 8 MG PO TABS: 8.0000 mg | ORAL_TABLET | Freq: Three times a day (TID) | ORAL | 2 refills | Status: DC | PRN

## 2022-08-06 ENCOUNTER — Encounter: Payer: Self-pay | Admitting: Nurse Practitioner

## 2022-08-07 ENCOUNTER — Other Ambulatory Visit (HOSPITAL_BASED_OUTPATIENT_CLINIC_OR_DEPARTMENT_OTHER): Payer: Self-pay | Admitting: Nurse Practitioner

## 2022-08-07 ENCOUNTER — Other Ambulatory Visit: Payer: Self-pay | Admitting: Nurse Practitioner

## 2022-08-07 DIAGNOSIS — F4310 Post-traumatic stress disorder, unspecified: Secondary | ICD-10-CM

## 2022-08-07 DIAGNOSIS — F39 Unspecified mood [affective] disorder: Secondary | ICD-10-CM

## 2022-08-07 DIAGNOSIS — F515 Nightmare disorder: Secondary | ICD-10-CM

## 2022-08-07 DIAGNOSIS — F339 Major depressive disorder, recurrent, unspecified: Secondary | ICD-10-CM

## 2022-08-07 DIAGNOSIS — F411 Generalized anxiety disorder: Secondary | ICD-10-CM

## 2022-08-09 DIAGNOSIS — M545 Low back pain, unspecified: Secondary | ICD-10-CM | POA: Diagnosis not present

## 2022-08-09 DIAGNOSIS — M25551 Pain in right hip: Secondary | ICD-10-CM | POA: Diagnosis not present

## 2022-08-11 DIAGNOSIS — I951 Orthostatic hypotension: Secondary | ICD-10-CM | POA: Diagnosis not present

## 2022-08-12 DIAGNOSIS — M545 Low back pain, unspecified: Secondary | ICD-10-CM | POA: Diagnosis not present

## 2022-08-12 DIAGNOSIS — M5441 Lumbago with sciatica, right side: Secondary | ICD-10-CM | POA: Diagnosis not present

## 2022-08-12 DIAGNOSIS — M25551 Pain in right hip: Secondary | ICD-10-CM | POA: Diagnosis not present

## 2022-08-23 DIAGNOSIS — M533 Sacrococcygeal disorders, not elsewhere classified: Secondary | ICD-10-CM | POA: Diagnosis not present

## 2022-08-23 DIAGNOSIS — M5416 Radiculopathy, lumbar region: Secondary | ICD-10-CM | POA: Diagnosis not present

## 2022-08-24 DIAGNOSIS — M533 Sacrococcygeal disorders, not elsewhere classified: Secondary | ICD-10-CM | POA: Diagnosis not present

## 2022-08-24 DIAGNOSIS — M5416 Radiculopathy, lumbar region: Secondary | ICD-10-CM | POA: Diagnosis not present

## 2022-08-31 DIAGNOSIS — M533 Sacrococcygeal disorders, not elsewhere classified: Secondary | ICD-10-CM | POA: Diagnosis not present

## 2022-09-08 DIAGNOSIS — M5416 Radiculopathy, lumbar region: Secondary | ICD-10-CM | POA: Diagnosis not present

## 2022-09-14 DIAGNOSIS — E871 Hypo-osmolality and hyponatremia: Secondary | ICD-10-CM | POA: Diagnosis not present

## 2022-09-14 DIAGNOSIS — G4733 Obstructive sleep apnea (adult) (pediatric): Secondary | ICD-10-CM | POA: Diagnosis not present

## 2022-09-14 DIAGNOSIS — N1831 Chronic kidney disease, stage 3a: Secondary | ICD-10-CM | POA: Diagnosis not present

## 2022-09-14 DIAGNOSIS — G709 Myoneural disorder, unspecified: Secondary | ICD-10-CM | POA: Diagnosis not present

## 2022-09-14 DIAGNOSIS — N390 Urinary tract infection, site not specified: Secondary | ICD-10-CM | POA: Diagnosis not present

## 2022-09-14 DIAGNOSIS — I5030 Unspecified diastolic (congestive) heart failure: Secondary | ICD-10-CM | POA: Diagnosis not present

## 2022-09-14 DIAGNOSIS — E785 Hyperlipidemia, unspecified: Secondary | ICD-10-CM | POA: Diagnosis not present

## 2022-09-14 DIAGNOSIS — I129 Hypertensive chronic kidney disease with stage 1 through stage 4 chronic kidney disease, or unspecified chronic kidney disease: Secondary | ICD-10-CM | POA: Diagnosis not present

## 2022-09-14 DIAGNOSIS — E039 Hypothyroidism, unspecified: Secondary | ICD-10-CM | POA: Diagnosis not present

## 2022-09-15 ENCOUNTER — Encounter: Payer: Self-pay | Admitting: Nurse Practitioner

## 2022-09-22 ENCOUNTER — Other Ambulatory Visit (HOSPITAL_BASED_OUTPATIENT_CLINIC_OR_DEPARTMENT_OTHER): Payer: Self-pay | Admitting: Nurse Practitioner

## 2022-09-22 DIAGNOSIS — E038 Other specified hypothyroidism: Secondary | ICD-10-CM

## 2022-09-27 DIAGNOSIS — M5416 Radiculopathy, lumbar region: Secondary | ICD-10-CM | POA: Diagnosis not present

## 2022-10-03 ENCOUNTER — Other Ambulatory Visit (HOSPITAL_BASED_OUTPATIENT_CLINIC_OR_DEPARTMENT_OTHER): Payer: Self-pay | Admitting: Nurse Practitioner

## 2022-10-03 ENCOUNTER — Other Ambulatory Visit: Payer: Self-pay | Admitting: Nurse Practitioner

## 2022-10-03 DIAGNOSIS — F515 Nightmare disorder: Secondary | ICD-10-CM

## 2022-10-03 DIAGNOSIS — F411 Generalized anxiety disorder: Secondary | ICD-10-CM

## 2022-10-03 DIAGNOSIS — F4310 Post-traumatic stress disorder, unspecified: Secondary | ICD-10-CM

## 2022-10-03 DIAGNOSIS — F39 Unspecified mood [affective] disorder: Secondary | ICD-10-CM

## 2022-10-03 DIAGNOSIS — F339 Major depressive disorder, recurrent, unspecified: Secondary | ICD-10-CM

## 2022-10-03 DIAGNOSIS — K219 Gastro-esophageal reflux disease without esophagitis: Secondary | ICD-10-CM

## 2022-10-04 NOTE — Telephone Encounter (Signed)
Refill request last apt 04/18/22

## 2022-10-05 DIAGNOSIS — M5416 Radiculopathy, lumbar region: Secondary | ICD-10-CM | POA: Diagnosis not present

## 2022-10-07 ENCOUNTER — Telehealth: Payer: Self-pay

## 2022-10-07 NOTE — Telephone Encounter (Signed)
Pt. Called needs a refill on her buspar last apt 04/20/22

## 2022-10-07 NOTE — Telephone Encounter (Signed)
sent 

## 2022-10-11 ENCOUNTER — Other Ambulatory Visit (HOSPITAL_BASED_OUTPATIENT_CLINIC_OR_DEPARTMENT_OTHER): Payer: Self-pay | Admitting: Nurse Practitioner

## 2022-10-11 DIAGNOSIS — K219 Gastro-esophageal reflux disease without esophagitis: Secondary | ICD-10-CM

## 2022-10-25 DIAGNOSIS — M48062 Spinal stenosis, lumbar region with neurogenic claudication: Secondary | ICD-10-CM | POA: Diagnosis not present

## 2022-11-02 ENCOUNTER — Other Ambulatory Visit: Payer: Self-pay | Admitting: Physical Medicine and Rehabilitation

## 2022-11-02 DIAGNOSIS — M48061 Spinal stenosis, lumbar region without neurogenic claudication: Secondary | ICD-10-CM

## 2022-11-04 NOTE — Discharge Instructions (Addendum)
Post Procedure Spinal Discharge Instruction Sheet  You may resume a regular diet and any medications that you routinely take (including pain medications) unless otherwise noted by MD.  No driving day of procedure.  Light activity throughout the rest of the day.  Do not do any strenuous work, exercise, bending or lifting.  The day following the procedure, you can resume normal physical activity but you should refrain from exercising or physical therapy for at least three days thereafter.  You may apply ice to the injection site, 20 minutes on, 20 minutes off, as needed. Do not apply ice directly to skin.    Common Side Effects:  Headaches- take your usual medications as directed by your physician.  Increase your fluid intake.  Caffeinated beverages may be helpful.  Lie flat in bed until your headache resolves.  Restlessness or inability to sleep- you may have trouble sleeping for the next few days.  Ask your referring physician if you need any medication for sleep.  Facial flushing or redness- should subside within a few days.  Increased pain- a temporary increase in pain a day or two following your procedure is not unusual.  Take your pain medication as prescribed by your referring physician.  Leg cramps  Please contact our office at [phone] for the following symptoms: Fever greater than 100 degrees. Headaches unresolved with medication after 2-3 days. Increased swelling, pain, or redness at injection site.   Thank you for visiting [facility] today.   If you take Aspirin, you may resume today after procedure.

## 2022-11-05 ENCOUNTER — Ambulatory Visit
Admission: RE | Admit: 2022-11-05 | Discharge: 2022-11-05 | Disposition: A | Discharge status: Home / Self Care | Payer: Medicare Other | Source: Ambulatory Visit | Attending: Physical Medicine and Rehabilitation | Admitting: Physical Medicine and Rehabilitation

## 2022-11-05 DIAGNOSIS — M48061 Spinal stenosis, lumbar region without neurogenic claudication: Secondary | ICD-10-CM

## 2022-11-05 DIAGNOSIS — M4727 Other spondylosis with radiculopathy, lumbosacral region: Secondary | ICD-10-CM | POA: Diagnosis not present

## 2022-11-05 MED ORDER — METHYLPREDNISOLONE ACETATE 40 MG/ML INJ SUSP (RADIOLOG
80.0000 mg | Freq: Once | INTRAMUSCULAR | Status: AC
  Administered 2022-11-05: 80 mg via EPIDURAL

## 2022-11-05 MED ORDER — IOPAMIDOL (ISOVUE-M 200) INJECTION 41%
1.0000 mL | Freq: Once | INTRAMUSCULAR | Status: AC
  Administered 2022-11-05: 1 mL via EPIDURAL

## 2022-12-28 ENCOUNTER — Other Ambulatory Visit: Payer: Self-pay | Admitting: Nurse Practitioner

## 2022-12-28 DIAGNOSIS — Z1231 Encounter for screening mammogram for malignant neoplasm of breast: Secondary | ICD-10-CM

## 2023-01-01 ENCOUNTER — Other Ambulatory Visit (HOSPITAL_BASED_OUTPATIENT_CLINIC_OR_DEPARTMENT_OTHER): Payer: Self-pay | Admitting: Nurse Practitioner

## 2023-01-01 DIAGNOSIS — F411 Generalized anxiety disorder: Secondary | ICD-10-CM

## 2023-01-01 DIAGNOSIS — F339 Major depressive disorder, recurrent, unspecified: Secondary | ICD-10-CM

## 2023-01-01 DIAGNOSIS — F4310 Post-traumatic stress disorder, unspecified: Secondary | ICD-10-CM

## 2023-01-03 NOTE — Telephone Encounter (Signed)
Last apt 04/20/22.

## 2023-01-04 ENCOUNTER — Ambulatory Visit
Admission: RE | Admit: 2023-01-04 | Discharge: 2023-01-04 | Disposition: A | Discharge status: Home / Self Care | Payer: Medicare Other | Source: Ambulatory Visit

## 2023-01-04 DIAGNOSIS — Z1231 Encounter for screening mammogram for malignant neoplasm of breast: Secondary | ICD-10-CM

## 2023-01-11 DIAGNOSIS — M791 Myalgia, unspecified site: Secondary | ICD-10-CM | POA: Diagnosis not present

## 2023-01-11 DIAGNOSIS — M25551 Pain in right hip: Secondary | ICD-10-CM | POA: Diagnosis not present

## 2023-01-12 ENCOUNTER — Other Ambulatory Visit (HOSPITAL_BASED_OUTPATIENT_CLINIC_OR_DEPARTMENT_OTHER): Payer: Self-pay | Admitting: Nurse Practitioner

## 2023-01-12 DIAGNOSIS — E038 Other specified hypothyroidism: Secondary | ICD-10-CM

## 2023-01-12 DIAGNOSIS — K219 Gastro-esophageal reflux disease without esophagitis: Secondary | ICD-10-CM

## 2023-02-03 DIAGNOSIS — M25561 Pain in right knee: Secondary | ICD-10-CM | POA: Diagnosis not present

## 2023-02-03 DIAGNOSIS — M25551 Pain in right hip: Secondary | ICD-10-CM | POA: Diagnosis not present

## 2023-02-04 ENCOUNTER — Other Ambulatory Visit: Payer: Self-pay | Admitting: Nurse Practitioner

## 2023-02-04 DIAGNOSIS — K219 Gastro-esophageal reflux disease without esophagitis: Secondary | ICD-10-CM

## 2023-02-06 ENCOUNTER — Other Ambulatory Visit: Payer: Self-pay | Admitting: Nurse Practitioner

## 2023-02-06 DIAGNOSIS — F39 Unspecified mood [affective] disorder: Secondary | ICD-10-CM

## 2023-02-07 NOTE — Telephone Encounter (Signed)
Last apt 04/20/22.

## 2023-02-15 ENCOUNTER — Ambulatory Visit (INDEPENDENT_AMBULATORY_CARE_PROVIDER_SITE_OTHER): Payer: Medicare Other | Admitting: Nurse Practitioner

## 2023-02-15 ENCOUNTER — Encounter: Payer: Self-pay | Admitting: Nurse Practitioner

## 2023-02-15 VITALS — BP 138/82 | HR 112 | Wt 180.8 lb

## 2023-02-15 DIAGNOSIS — R7303 Prediabetes: Secondary | ICD-10-CM | POA: Diagnosis not present

## 2023-02-15 DIAGNOSIS — R062 Wheezing: Secondary | ICD-10-CM | POA: Diagnosis not present

## 2023-02-15 DIAGNOSIS — D72828 Other elevated white blood cell count: Secondary | ICD-10-CM

## 2023-02-15 DIAGNOSIS — F39 Unspecified mood [affective] disorder: Secondary | ICD-10-CM

## 2023-02-15 DIAGNOSIS — R051 Acute cough: Secondary | ICD-10-CM | POA: Diagnosis not present

## 2023-02-15 DIAGNOSIS — R112 Nausea with vomiting, unspecified: Secondary | ICD-10-CM

## 2023-02-15 DIAGNOSIS — H8110 Benign paroxysmal vertigo, unspecified ear: Secondary | ICD-10-CM | POA: Diagnosis not present

## 2023-02-15 DIAGNOSIS — R110 Nausea: Secondary | ICD-10-CM

## 2023-02-15 DIAGNOSIS — E038 Other specified hypothyroidism: Secondary | ICD-10-CM | POA: Diagnosis not present

## 2023-02-15 DIAGNOSIS — T7840XD Allergy, unspecified, subsequent encounter: Secondary | ICD-10-CM

## 2023-02-15 DIAGNOSIS — R42 Dizziness and giddiness: Secondary | ICD-10-CM | POA: Diagnosis not present

## 2023-02-15 DIAGNOSIS — N182 Chronic kidney disease, stage 2 (mild): Secondary | ICD-10-CM | POA: Diagnosis not present

## 2023-02-15 DIAGNOSIS — E559 Vitamin D deficiency, unspecified: Secondary | ICD-10-CM | POA: Diagnosis not present

## 2023-02-15 DIAGNOSIS — G4733 Obstructive sleep apnea (adult) (pediatric): Secondary | ICD-10-CM | POA: Diagnosis not present

## 2023-02-15 MED ORDER — AZITHROMYCIN 250 MG PO TABS
ORAL_TABLET | ORAL | 0 refills | Status: AC
Start: 2023-02-15 — End: 2023-02-20

## 2023-02-15 MED ORDER — ALBUTEROL SULFATE HFA 108 (90 BASE) MCG/ACT IN AERS: INHALATION_SPRAY | RESPIRATORY_TRACT | 6 refills | Status: DC

## 2023-02-15 MED ORDER — FLUTICASONE PROPIONATE 50 MCG/ACT NA SUSP: NASAL | 5 refills | Status: DC

## 2023-02-15 MED ORDER — PROMETHAZINE HCL 12.5 MG PO TABS: 12.5000 mg | ORAL_TABLET | Freq: Three times a day (TID) | ORAL | 0 refills | Status: DC | PRN

## 2023-02-15 MED ORDER — ONDANSETRON 8 MG PO TBDP
8.0000 mg | ORAL_TABLET | Freq: Three times a day (TID) | ORAL | 0 refills | Status: AC | PRN
Start: 2023-02-15 — End: ?

## 2023-02-15 NOTE — Patient Instructions (Addendum)
It was SO GOOD to see you! I will let you know what your labs show and if we need to change anything I will let you know.  If you are not feeling better by Friday morning please all and let me know. If I need to see you again I can get you in.   Community-Acquired Pneumonia, Adult Pneumonia is a lung infection that causes inflammation and the buildup of mucus and fluids in the lungs. This may cause coughing and difficulty breathing. Community-acquired pneumonia is pneumonia that develops in people who are not, and have not recently been, in a hospital or other health care facility. Usually, pneumonia develops as a result of an illness that is caused by a virus, such as the common cold and the flu (influenza). It can also be caused by bacteria or fungi. While the common cold and influenza can pass from person to person (are contagious), pneumonia itself is not considered contagious. What are the causes? This condition may be caused by: Viruses. Bacteria. Fungi. What increases the risk? The following factors may make you more likely to develop this condition: Being over age 69 or having certain medical conditions, such as: A long-term (chronic) disease, such as: chronic obstructive pulmonary disease (COPD), asthma, heart failure, diabetes, or kidney disease. A condition that increases the risk of breathing in (aspirating) mucus and other fluids from your mouth and nose. A weakened body defense system (immune system). Having had your spleen removed (splenectomy). The spleen is the organ that helps fight germs and infections. Not cleaning your teeth and gums well (poor dental hygiene). Using tobacco products. Traveling to places where germs that cause pneumonia are present or being near certain animals or animal habitats that could have germs that cause pneumonia. What are the signs or symptoms? Symptoms of this condition include: A dry cough or a wet (productive) cough. A fever, sweating, or  chills. Chest pain, especially when breathing deeply or coughing. Fast breathing, difficulty breathing, or shortness of breath. Tiredness (fatigue) and muscle aches. How is this diagnosed? This condition may be diagnosed based on your medical history or a physical exam. You may also have tests, including: Imaging, such as a chest X-ray or lung ultrasound. Tests of: The level of oxygen and other gases in your blood. Mucus from your lungs (sputum). Fluid around your lungs (pleural fluid). Your urine. How is this treated? Treatment for this condition depends on many factors, such as the cause of your pneumonia, your medicines, and other medical conditions that you have. For most adults, pneumonia may be treated at home. In some cases, treatment must happen in a hospital and may include: Medicines that are given by mouth (orally) or through an IV, including: Antibiotic medicines, if bacteria caused the pneumonia. Medicines that kill viruses (antiviral medicines), if a virus caused the pneumonia. Oxygen therapy. Severe pneumonia, although rare, may require the following treatments: Mechanical ventilation.This procedure uses a machine to help you breathe if you cannot breathe well on your own or maintain a safe level of blood oxygen. Thoracentesis. This procedure removes any buildup of pleural fluid to help with breathing. Follow these instructions at home:  Medicines Take over-the-counter and prescription medicines only as told by your health care provider. Take cough medicine only if you have trouble sleeping. Cough medicine can prevent your body from removing mucus from your lungs. If you were prescribed antibiotics, take them as told by your health care provider. Do not stop taking the antibiotic even if you  start to feel better. Lifestyle     Do not drink alcohol. Do not use any products that contain nicotine or tobacco. These products include cigarettes, chewing tobacco, and vaping  devices, such as e-cigarettes. If you need help quitting, ask your health care provider. Eat a healthy diet. This includes plenty of vegetables, fruits, whole grains, low-fat dairy products, and lean protein. General instructions Rest a lot and get at least 8 hours of sleep each night. Sleep in a partly upright position at night. Place a few pillows under your head or sleep in a reclining chair. Return to your normal activities as told by your health care provider. Ask your health care provider what activities are safe for you. Drink enough fluid to keep your urine pale yellow. This helps to thin the mucus in your lungs. If your throat is sore, gargle with a mixture of salt and water 3-4 times a day or as needed. To make salt water, completely dissolve -1 tsp (3-6 g) of salt in 1 cup (237 mL) of warm water. Keep all follow-up visits. How is this prevented? You can lower your risk of developing community-acquired pneumonia by: Getting the pneumonia vaccine. There are different types and schedules of pneumonia vaccines. Ask your health care provider which option is best for you. Consider getting the pneumonia vaccine if: You are older than 69 years of age. You are 67-33 years of age and are receiving cancer treatment, have chronic lung disease, or have other medical conditions that affect your immune system. Ask your health care provider if this applies to you. Getting your influenza vaccine every year. Ask your health care provider which type of vaccine is best for you. Getting regular dental checkups. Washing your hands often with soap and water for at least 20 seconds. If soap and water are not available, use hand sanitizer. Contact a health care provider if: You have a fever. You have trouble sleeping because you cannot control your cough with cough medicine. Get help right away if: Your shortness of breath becomes worse. Your chest pain increases. Your sickness becomes worse, especially if  you are an older adult or have a weak immune system. You cough up blood. These symptoms may be an emergency. Get help right away. Call 911. Do not wait to see if the symptoms will go away. Do not drive yourself to the hospital. Summary Pneumonia is an infection of the lungs. Community-acquired pneumonia develops in people who have not been in the hospital. It can be caused by bacteria, viruses, or fungi. This condition may be treated with antibiotics or antiviral medicines. Severe pneumonia may require a hospital stay and treatment to help with breathing. This information is not intended to replace advice given to you by your health care provider. Make sure you discuss any questions you have with your health care provider. Document Revised: 07/01/2021 Document Reviewed: 07/01/2021 Elsevier Patient Education  2024 [employer].

## 2023-02-15 NOTE — Progress Notes (Signed)
Tollie Eth, DNP, AGNP-c [facility] [address] [place], [place] [address] [phone]   ACUTE VISIT- ESTABLISHED PATIENT  Blood pressure 138/82, pulse (!) 112, weight 180 lb 12.8 oz (82 kg).  Subjective:  HPI Jennifer Davidson is a 67 y.o. female presents to day for evaluation of acute concern(s).   History of Present Illness Jennifer Davidson presents with a chief complaint of persistent nausea, vomiting, and dizziness for the past two to three weeks. The symptoms are not associated with food intake and can occur spontaneously, even during sedentary activities such as watching television. Her nausea is sometimes severe enough to induce vomiting, but at other times, it manifests as a strong feeling of sickness. The patient has been managing these symptoms with Meclizine, which provides relief most of the time. She reports her appetite has been significantly reduced, and she reports having to force herself to eat. Despite this, the patient has not noticed any significant weight loss. The patient denies any recent changes in urinary symptoms or signs of bleeding. She has a history of oral sores, which have resolved and not recurred.    She also reports a history of dizziness, which has been evaluated by multiple physicians without a definitive diagnosis. The dizziness can occur spontaneously, even upon getting out of bed, and is severe enough that the patient has to be cautious during these movements. The patient has been managing this symptom with Meclizine, which provides relief most of the time.     The patient reports a lack of energy and poor sleep quality, characterized by frequent tossing and turning.    She also reports occasional episodes of shortness of breath and has requested a refill for her inhaler and nasal drops.  . She also reports a persistent cough, which sometimes produces mucus.  She has been experiencing numbness in the right hip and back, which she  attributes to an ongoing issue with these areas. There is also intermittent swelling in the feet, which varies in severity from day to day. The patient has noticed occasional palpitations, but it is unclear whether these are due to anxiety or a physical condition.  The patient's recent symptoms have significantly impacted her quality of life, leading to a decrease in energy levels and poor sleep quality. Despite these challenges, the patient continues to maintain an active lifestyle and adheres to her medication regimen.  ROS negative except for what is listed in HPI. History, Medications, Surgery, SDOH, and Family History reviewed and updated as appropriate.  Objective:  Physical Exam Vitals and nursing note reviewed.  Constitutional:      Appearance: Normal appearance.  HENT:     Head: Normocephalic.  Eyes:     Conjunctiva/sclera: Conjunctivae normal.     Pupils: Pupils are equal, round, and reactive to light.  Neck:     Vascular: No carotid bruit.  Cardiovascular:     Rate and Rhythm: Normal rate and regular rhythm.     Pulses: Normal pulses.     Heart sounds: Normal heart sounds.  Pulmonary:     Effort: Pulmonary effort is normal.     Breath sounds: Wheezing and rhonchi present.  Abdominal:     General: Bowel sounds are normal. There is no distension.     Palpations: Abdomen is soft.     Tenderness: There is no abdominal tenderness. There is no guarding.  Musculoskeletal:        General: Normal range of motion.     Cervical back: Normal range  of motion.     Right lower leg: No edema.     Left lower leg: No edema.  Lymphadenopathy:     Cervical: No cervical adenopathy.  Skin:    General: Skin is warm.  Neurological:     General: No focal deficit present.     Mental Status: She is alert and oriented to person, place, and time.     Cranial Nerves: No cranial nerve deficit.     Sensory: No sensory deficit.     Motor: No weakness.     Coordination: Coordination normal.      Gait: Gait normal.  Psychiatric:        Mood and Affect: Mood normal.        Behavior: Behavior normal.         Assessment & Plan:   Problem List Items Addressed This Visit     Chronic kidney disease (Chronic)    Intermittent LE edema reported. Today no alarm symptoms are present. Will monitor labs to ensure this is not contributing to her current symptoms. Wheezing and rhonchi present in the lungs, but no signs of fluid overload, which is reassuring.       Relevant Orders   Hemoglobin A1c (Completed)   CBC with Differential/Platelet (Completed)   Comprehensive metabolic panel (Completed)   Iron, TIBC and Ferritin Panel (Completed)   VITAMIN D 25 Hydroxy (Vit-D Deficiency, Fractures) (Completed)   TSH (Completed)   T4, free (Completed)   Hypothyroid (Chronic)    Historically well controlled. WIll monitor labs to ensure this is not contributing to her current symptoms.       Relevant Orders   Hemoglobin A1c (Completed)   CBC with Differential/Platelet (Completed)   Comprehensive metabolic panel (Completed)   Iron, TIBC and Ferritin Panel (Completed)   VITAMIN D 25 Hydroxy (Vit-D Deficiency, Fractures) (Completed)   TSH (Completed)   T4, free (Completed)   Mood disorder (HCC)    Chronic. Stable. No alarm symptoms are present. Refills provided.       Relevant Orders   Hemoglobin A1c (Completed)   CBC with Differential/Platelet (Completed)   Comprehensive metabolic panel (Completed)   Iron, TIBC and Ferritin Panel (Completed)   VITAMIN D 25 Hydroxy (Vit-D Deficiency, Fractures) (Completed)   TSH (Completed)   T4, free (Completed)   Prediabetes    Evaluation of labs today to ensure that this is not progressing and contributing to symptoms      Relevant Orders   Hemoglobin A1c (Completed)   CBC with Differential/Platelet (Completed)   Comprehensive metabolic panel (Completed)   Iron, TIBC and Ferritin Panel (Completed)   VITAMIN D 25 Hydroxy (Vit-D Deficiency,  Fractures) (Completed)   TSH (Completed)   T4, free (Completed)   Allergies   Relevant Medications   fluticasone (FLONASE) 50 MCG/ACT nasal spray   Nausea and vomiting    Recent increase in nausea and vomiting in the setting of intermittent dizziness for the past 2-3 weeks. Hx of vertigo, but this seems to be a little different. Evaluation shows crackles in the lungs and reports of shortness of breath, which lead me to suspect possible CAP, which could certainly be a contributor. Her O2 sats are good.  -Will treat possible CAP and send ondansetron and promethazine for nausea and vomiting respectively.       Relevant Medications   ondansetron (ZOFRAN-ODT) 8 MG disintegrating tablet   promethazine (PHENERGAN) 12.5 MG tablet   Other Relevant Orders   Hemoglobin A1c (Completed)   CBC  with Differential/Platelet (Completed)   Comprehensive metabolic panel (Completed)   Iron, TIBC and Ferritin Panel (Completed)   VITAMIN D 25 Hydroxy (Vit-D Deficiency, Fractures) (Completed)   TSH (Completed)   T4, free (Completed)   Dizziness    History of BPPV with recent increase in dizziness, nausea, and intermittent vomiting. At this time it is not clear if this is the cause of the dizziness she is experiencing, but unable to rule out. We will monitor her labs to ensure that there are no other factors contributing. She does have crackles noted in her lungs and shortness of breath, which makes me question possible CAP which could be a cause. BP is stable with no signs of orthostasis.  -Ondansetron for nausea -Promethazine if vomiting occurs -Will monitor labs.      Relevant Orders   Hemoglobin A1c (Completed)   CBC with Differential/Platelet (Completed)   Comprehensive metabolic panel (Completed)   Iron, TIBC and Ferritin Panel (Completed)   VITAMIN D 25 Hydroxy (Vit-D Deficiency, Fractures) (Completed)   TSH (Completed)   T4, free (Completed)   Wheezing    Associated with cough and production of  mucus. Auscultation revealed abnormal lung sounds. -Start Azithromycin to cover for possible walking pneumonia. -Prescribe sample of Trelegy inhaler to use for two weeks. -If symptoms do not improve by Friday, consider chest x-ray.      Relevant Medications   albuterol (VENTOLIN HFA) 108 (90 Base) MCG/ACT inhaler   Benign paroxysmal positional vertigo - Primary    History of BPPV with recent increase in dizziness, nausea, and intermittent vomiting. At this time it is not clear if this is the cause of the dizziness she is experiencing, but unable to rule out. We will monitor her labs to ensure that there are no other factors contributing. She does have crackles noted in her lungs and shortness of breath, which makes me question possible CAP which could be a cause. BP is stable with no signs of orthostasis.  -Ondansetron for nausea -Promethazine if vomiting occurs -Will monitor labs.      Vitamin D deficiency   Relevant Orders   Hemoglobin A1c (Completed)   CBC with Differential/Platelet (Completed)   Comprehensive metabolic panel (Completed)   Iron, TIBC and Ferritin Panel (Completed)   VITAMIN D 25 Hydroxy (Vit-D Deficiency, Fractures) (Completed)   TSH (Completed)   T4, free (Completed)   OSA (obstructive sleep apnea)   Relevant Orders   Hemoglobin A1c (Completed)   CBC with Differential/Platelet (Completed)   Comprehensive metabolic panel (Completed)   Iron, TIBC and Ferritin Panel (Completed)   VITAMIN D 25 Hydroxy (Vit-D Deficiency, Fractures) (Completed)   TSH (Completed)   T4, free (Completed)   Other Visit Diagnoses     Acute cough          Tollie Eth, DNP, AGNP-c

## 2023-02-17 LAB — CBC WITH DIFFERENTIAL/PLATELET
Basophils Absolute: 0.2 10*3/uL (ref 0.0–0.2)
Basos: 1 %
EOS (ABSOLUTE): 1.3 10*3/uL — ABNORMAL HIGH (ref 0.0–0.4)
Eos: 7 %
Hematocrit: 31.8 % — ABNORMAL LOW (ref 34.0–46.6)
Hemoglobin: 10.4 g/dL — ABNORMAL LOW (ref 11.1–15.9)
Lymphocytes Absolute: 2.3 10*3/uL (ref 0.7–3.1)
Lymphs: 12 %
MCH: 28.9 pg (ref 26.6–33.0)
MCHC: 32.7 g/dL (ref 31.5–35.7)
MCV: 88 fL (ref 79–97)
Monocytes Absolute: 0.9 10*3/uL (ref 0.1–0.9)
Monocytes: 5 %
Neutrophils Absolute: 13.2 10*3/uL — ABNORMAL HIGH (ref 1.4–7.0)
Neutrophils: 70 %
Platelets: 525 10*3/uL — ABNORMAL HIGH (ref 150–450)
RBC: 3.6 x10E6/uL — ABNORMAL LOW (ref 3.77–5.28)
RDW: 13.7 % (ref 11.7–15.4)
WBC: 18.8 10*3/uL — ABNORMAL HIGH (ref 3.4–10.8)

## 2023-02-17 LAB — COMPREHENSIVE METABOLIC PANEL
ALT: 14 [IU]/L (ref 0–32)
AST: 15 [IU]/L (ref 0–40)
Albumin: 3.6 g/dL — ABNORMAL LOW (ref 3.9–4.9)
Alkaline Phosphatase: 116 [IU]/L (ref 44–121)
BUN/Creatinine Ratio: 12 (ref 12–28)
BUN: 17 mg/dL (ref 8–27)
Bilirubin Total: <0.2 mg/dL (ref 0.0–1.2)
CO2: 20 mmol/L (ref 20–29)
Calcium: 9.8 mg/dL (ref 8.7–10.3)
Chloride: 93 mmol/L — ABNORMAL LOW (ref 96–106)
Creatinine, Ser: 1.42 mg/dL — ABNORMAL HIGH (ref 0.57–1.00)
Globulin, Total: 2.7 g/dL (ref 1.5–4.5)
Glucose: 96 mg/dL (ref 70–99)
Potassium: 5.9 mmol/L — ABNORMAL HIGH (ref 3.5–5.2)
Sodium: 130 mmol/L — ABNORMAL LOW (ref 134–144)
Total Protein: 6.3 g/dL (ref 6.0–8.5)
eGFR: 40 mL/min/{1.73_m2} — ABNORMAL LOW (ref >59)

## 2023-02-17 LAB — TSH: TSH: 1.87 u[IU]/mL (ref 0.450–4.500)

## 2023-02-17 LAB — IMMATURE CELLS
Blasts/blast like cells: 1 % — ABNORMAL HIGH (ref 0–0)
MYELOCYTES: 2 % — ABNORMAL HIGH (ref 0–0)
Metamyelocytes: 2 % — ABNORMAL HIGH (ref 0–0)

## 2023-02-17 LAB — IRON,TIBC AND FERRITIN PANEL
Ferritin: 80 ng/mL (ref 15–150)
Iron Saturation: 11 % — ABNORMAL LOW (ref 15–55)
Iron: 26 ug/dL — ABNORMAL LOW (ref 27–139)
Total Iron Binding Capacity: 243 ug/dL — ABNORMAL LOW (ref 250–450)
UIBC: 217 ug/dL (ref 118–369)

## 2023-02-17 LAB — VITAMIN D 25 HYDROXY (VIT D DEFICIENCY, FRACTURES): Vit D, 25-Hydroxy: 44.9 ng/mL (ref 30.0–100.0)

## 2023-02-17 LAB — T4, FREE: Free T4: 1.12 ng/dL (ref 0.82–1.77)

## 2023-02-17 LAB — HEMOGLOBIN A1C
Est. average glucose Bld gHb Est-mCnc: 108 mg/dL
Hgb A1c MFr Bld: 5.4 % (ref 4.8–5.6)

## 2023-02-21 ENCOUNTER — Encounter: Payer: Self-pay | Admitting: Nurse Practitioner

## 2023-02-22 ENCOUNTER — Other Ambulatory Visit: Payer: Self-pay

## 2023-02-22 DIAGNOSIS — D72828 Other elevated white blood cell count: Secondary | ICD-10-CM

## 2023-02-22 DIAGNOSIS — E875 Hyperkalemia: Secondary | ICD-10-CM

## 2023-02-22 DIAGNOSIS — R062 Wheezing: Secondary | ICD-10-CM | POA: Insufficient documentation

## 2023-02-22 DIAGNOSIS — H8110 Benign paroxysmal vertigo, unspecified ear: Secondary | ICD-10-CM | POA: Insufficient documentation

## 2023-02-22 NOTE — Assessment & Plan Note (Signed)
Chronic. Stable. No alarm symptoms are present. Refills provided.

## 2023-02-22 NOTE — Assessment & Plan Note (Signed)
Recent increase in nausea and vomiting in the setting of intermittent dizziness for the past 2-3 weeks. Hx of vertigo, but this seems to be a little different. Evaluation shows crackles in the lungs and reports of shortness of breath, which lead me to suspect possible CAP, which could certainly be a contributor. Her O2 sats are good.  -Will treat possible CAP and send ondansetron and promethazine for nausea and vomiting respectively.

## 2023-02-22 NOTE — Assessment & Plan Note (Addendum)
>>  ASSESSMENT AND PLAN FOR BENIGN PAROXYSMAL POSITIONAL VERTIGO WRITTEN ON 02/22/2023  7:40 AM BY Jelicia Nantz E, NP  History of BPPV with recent increase in dizziness, nausea, and intermittent vomiting. At this time it is not clear if this is the cause of the dizziness she is experiencing, but unable to rule out. We will monitor her labs to ensure that there are no other factors contributing. She does have crackles noted in her lungs and shortness of breath, which makes me question possible CAP which could be a cause. BP is stable with no signs of orthostasis.  -Ondansetron  for nausea -Promethazine  if vomiting occurs -Will monitor labs.   >>ASSESSMENT AND PLAN FOR DIZZINESS WRITTEN ON 02/22/2023  7:40 AM BY Luian Schumpert E, NP  History of BPPV with recent increase in dizziness, nausea, and intermittent vomiting. At this time it is not clear if this is the cause of the dizziness she is experiencing, but unable to rule out. We will monitor her labs to ensure that there are no other factors contributing. She does have crackles noted in her lungs and shortness of breath, which makes me question possible CAP which could be a cause. BP is stable with no signs of orthostasis.  -Ondansetron  for nausea -Promethazine  if vomiting occurs -Will monitor labs.

## 2023-02-22 NOTE — Assessment & Plan Note (Signed)
Associated with cough and production of mucus. Auscultation revealed abnormal lung sounds. -Start Azithromycin to cover for possible walking pneumonia. -Prescribe sample of Trelegy inhaler to use for two weeks. -If symptoms do not improve by Friday, consider chest x-ray.

## 2023-02-22 NOTE — Assessment & Plan Note (Addendum)
Intermittent LE edema reported. Today no alarm symptoms are present. Will monitor labs to ensure this is not contributing to her current symptoms. Wheezing and rhonchi present in the lungs, but no signs of fluid overload, which is reassuring.

## 2023-02-22 NOTE — Assessment & Plan Note (Signed)
Historically well controlled. WIll monitor labs to ensure this is not contributing to her current symptoms.

## 2023-02-22 NOTE — Assessment & Plan Note (Signed)
History of BPPV with recent increase in dizziness, nausea, and intermittent vomiting. At this time it is not clear if this is the cause of the dizziness she is experiencing, but unable to rule out. We will monitor her labs to ensure that there are no other factors contributing. She does have crackles noted in her lungs and shortness of breath, which makes me question possible CAP which could be a cause. BP is stable with no signs of orthostasis.  -Ondansetron for nausea -Promethazine if vomiting occurs -Will monitor labs.

## 2023-02-22 NOTE — Assessment & Plan Note (Signed)
Evaluation of labs today to ensure that this is not progressing and contributing to symptoms

## 2023-02-22 NOTE — Assessment & Plan Note (Signed)
>>  ASSESSMENT AND PLAN FOR CHRONIC KIDNEY DISEASE WRITTEN ON 02/22/2023  7:45 AM BY Shadow Schedler E, NP  Intermittent LE edema reported. Today no alarm symptoms are present. Will monitor labs to ensure this is not contributing to her current symptoms. Wheezing and rhonchi present in the lungs, but no signs of fluid overload, which is reassuring.

## 2023-02-28 ENCOUNTER — Other Ambulatory Visit: Payer: Medicare Other

## 2023-02-28 DIAGNOSIS — E875 Hyperkalemia: Secondary | ICD-10-CM | POA: Diagnosis not present

## 2023-02-28 DIAGNOSIS — D72828 Other elevated white blood cell count: Secondary | ICD-10-CM | POA: Diagnosis not present

## 2023-03-01 LAB — CBC WITH DIFFERENTIAL/PLATELET
Basophils Absolute: 0.1 10*3/uL (ref 0.0–0.2)
Basos: 1 %
EOS (ABSOLUTE): 0.8 10*3/uL — ABNORMAL HIGH (ref 0.0–0.4)
Eos: 6 %
Hematocrit: 35.2 % (ref 34.0–46.6)
Hemoglobin: 11.3 g/dL (ref 11.1–15.9)
Immature Grans (Abs): 0.3 10*3/uL — ABNORMAL HIGH (ref 0.0–0.1)
Immature Granulocytes: 2 %
Lymphocytes Absolute: 1.8 10*3/uL (ref 0.7–3.1)
Lymphs: 13 %
MCH: 29.4 pg (ref 26.6–33.0)
MCHC: 32.1 g/dL (ref 31.5–35.7)
MCV: 92 fL (ref 79–97)
Monocytes Absolute: 1.1 10*3/uL — ABNORMAL HIGH (ref 0.1–0.9)
Monocytes: 8 %
Neutrophils Absolute: 10 10*3/uL — ABNORMAL HIGH (ref 1.4–7.0)
Neutrophils: 70 %
Platelets: 479 10*3/uL — ABNORMAL HIGH (ref 150–450)
RBC: 3.84 x10E6/uL (ref 3.77–5.28)
RDW: 16.1 % — ABNORMAL HIGH (ref 11.7–15.4)
WBC: 14 10*3/uL — ABNORMAL HIGH (ref 3.4–10.8)

## 2023-03-01 LAB — COMPREHENSIVE METABOLIC PANEL
ALT: 13 IU/L (ref 0–32)
AST: 16 IU/L (ref 0–40)
Albumin: 3.7 g/dL — ABNORMAL LOW (ref 3.9–4.9)
Alkaline Phosphatase: 95 IU/L (ref 44–121)
BUN/Creatinine Ratio: 12 (ref 12–28)
BUN: 19 mg/dL (ref 8–27)
Bilirubin Total: 0.2 mg/dL (ref 0.0–1.2)
CO2: 21 mmol/L (ref 20–29)
Calcium: 9.2 mg/dL (ref 8.7–10.3)
Chloride: 98 mmol/L (ref 96–106)
Creatinine, Ser: 1.54 mg/dL — ABNORMAL HIGH (ref 0.57–1.00)
Globulin, Total: 2.2 g/dL (ref 1.5–4.5)
Glucose: 77 mg/dL (ref 70–99)
Potassium: 5.3 mmol/L — ABNORMAL HIGH (ref 3.5–5.2)
Sodium: 135 mmol/L (ref 134–144)
Total Protein: 5.9 g/dL — ABNORMAL LOW (ref 6.0–8.5)
eGFR: 36 mL/min/{1.73_m2} — ABNORMAL LOW (ref >59)

## 2023-03-03 ENCOUNTER — Encounter (HOSPITAL_COMMUNITY): Payer: Self-pay

## 2023-03-03 ENCOUNTER — Ambulatory Visit (HOSPITAL_COMMUNITY): Payer: Medicare Other | Attending: Orthopedic Surgery

## 2023-03-03 ENCOUNTER — Ambulatory Visit (HOSPITAL_COMMUNITY): Payer: Medicare Other

## 2023-03-03 ENCOUNTER — Other Ambulatory Visit: Payer: Self-pay

## 2023-03-03 DIAGNOSIS — M25551 Pain in right hip: Secondary | ICD-10-CM | POA: Diagnosis not present

## 2023-03-03 DIAGNOSIS — M6281 Muscle weakness (generalized): Secondary | ICD-10-CM

## 2023-03-03 NOTE — Therapy (Addendum)
OUTPATIENT PHYSICAL THERAPY LOWER EXTREMITY EVALUATION   Patient Name: Jennifer Davidson MRN: [id number] DOB:January 18, 1956, 67 y.o., female Today's Date: 03/03/2023  END OF SESSION:  PT End of Session - 03/03/23 1355     Visit Number 1    Number of Visits 16    Date for PT Re-Evaluation 04/28/23    Authorization Type Medicare A & B    Authorization Time Period no auth; no limit    Progress Note Due on Visit 10    PT Start Time 1145    PT Stop Time 1229    PT Time Calculation (min) 44 min    Activity Tolerance Patient tolerated treatment well    Behavior During Therapy [facility] for tasks assessed/performed             Past Medical History:  Diagnosis Date   Acute cystitis without hematuria    Acute encephalopathy 06/25/2016   Altered mental status 06/23/2017   Anxiety    Anxiety and depression 02/04/2012   Arthritis    Cataract 08/31/2017   left eye   CHF (congestive heart failure) (HCC) 2017   resolved   Cholesterol serum increased    Chronic back pain    chronic Rt low back pain. s/p L4-5 fusion. failed Rt facet injections. poss due to Rt SI joint dysfunction.   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   Depression    Diabetes mellitus without complication (HCC)    Diastolic heart failure (HCC) 03/17/2012   Edema 02/04/2012   Encounter for long-term (current) use of medications 02/04/2012   Fall as cause of accidental injury at home as place of occurrence 09/08/2018   Gastroenteritis 01/13/2021   GERD (gastroesophageal reflux disease) 11/13/2015   Hallucination, visual 09/14/2017   Head injury 09/08/2018   Hyperlipidemia    Hyperparathyroidism (HCC)    Hyperparathyroidism, 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    [facility] Irena Cords, MD  Plan:  25 Hydroxy Vit D (59.1 ng/ml)     Hypothyroidism    Moderate episode of recurrent major depressive disorder (HCC) 08/31/2016   PONV (postoperative nausea and vomiting)    Seizures (HCC)    3 years ago,2018   Sepsis (HCC) 04/29/2020   Sleep apnea    cpap   Syncope 08/30/2015   Syncope and collapse 02/12/2012   Tachycardia 02/04/2012   Trochanteric bursitis of both hips 2012   Confirmed on MRI   Past Surgical History:  Procedure Laterality Date   ABDOMINAL HYSTERECTOMY     APPENDECTOMY  1986   BACK SURGERY     L4-5 fusion   CATARACT EXTRACTION     KNEE SURGERY     REVERSE SHOULDER ARTHROPLASTY Left 07/03/2020   Procedure: REVERSE SHOULDER ARTHROPLASTY;  Surgeon: Jones Broom, MD;  Location: [facility];  Service: Orthopedics;  Laterality: Left;   REVERSE SHOULDER ARTHROPLASTY Right 03/05/2021   Procedure: REVERSE SHOULDER ARTHROPLASTY;  Surgeon: Jones Broom, MD;  Location: [facility];  Service: Orthopedics;  Laterality: Right;   SHOULDER ARTHROSCOPY WITH SUBACROMIAL DECOMPRESSION Left 10/20/2018   Procedure: SHOULDER ARTHROSCOPY, ROTATOR CUFF DEBRIDEMENT, GLENOHUMERAL JOINT DEBRIDEMENT, ACROMIOPLASTY, DISTAL CLAVICLE RESECTION;  Surgeon: Jodi Geralds, MD;  Location: [facility];  Service: Orthopedics;  Laterality: Left;   Patient Active Problem List   Diagnosis Date Noted   Wheezing 02/22/2023   Benign paroxysmal positional vertigo  02/22/2023   Vitamin D deficiency 02/15/2023   Dizziness 06/24/2022   Recurrent oral ulcers 09/29/2021   Primary insomnia 08/27/2021   OSA (obstructive sleep apnea) 07/08/2021   Nausea and vomiting 07/08/2021   Rotator cuff tear arthropathy of right shoulder 02/20/2021   Allergies 05/06/2020   Rotator cuff tear arthropathy of left shoulder 10/20/2018   Arthritis of left acromioclavicular joint 10/20/2018   Prediabetes 09/14/2017   Dream anxiety disorder 09/14/2017   PTSD (post-traumatic stress disorder) 08/31/2016   Gastroesophageal reflux disease 11/13/2015   Generalized anxiety  disorder 06/14/2015   Neuropathy of lower extremity 05/14/2015   Headache, migraine 01/17/2015   Major depression, recurrent, chronic (HCC) 12/02/2014   Mood disorder (HCC) 12/01/2014   Chronic back pain    Chronic kidney disease 02/04/2012   Hypothyroid 02/04/2012   Mixed hyperlipidemia 02/04/2012    PCP: Tollie Eth, NP  REFERRING PROVIDER: Jodi Geralds MD  REFERRING DIAG: M25.551 (ICD-10-CM) - Right hip pain   THERAPY DIAG:  Pain in right hip  Muscle weakness (generalized)  Rationale for Evaluation and Treatment: Rehabilitation  ONSET DATE: ~ 2 months ago  SUBJECTIVE:   SUBJECTIVE STATEMENT: Pt reporting about 2 months ago starting hurting all the way down the leg. Has had prior history of back pain and hurts all the time. Sharp pain 9/10 that can run down the entire leg. Feels like its pulling in her foot. Retired does a lot of yard activity. Limited endurance in grocery store.   PERTINENT HISTORY: L4-L5 Fusion in 2011 PAIN:  Are you having pain? Yes: NPRS scale: 9/10 Pain location: R hip and intermittently down into foot Pain description: sharp Aggravating factors: Long walks Relieving factors: relaxation and occasional muscle spasm meds   PRECAUTIONS: None  RED FLAGS: None   WEIGHT BEARING RESTRICTIONS: No  FALLS:  Has patient fallen in last 6 months? Yes. Number of falls 3  LIVING ENVIRONMENT: Lives with: lives with their spouse Lives in: House/apartment Stairs:  1 threshold step/ has ramp Has following equipment at home: Counselling psychologist, Environmental consultant - 2 wheeled, and Wheelchair (manual)  OCCUPATION: Retired  PLOF: Independent  PATIENT GOALS: "get better"  NEXT MD VISIT: Last of november  OBJECTIVE:  Note: Objective measures were completed at Evaluation unless otherwise noted.  DIAGNOSTIC FINDINGS:   PATIENT SURVEYS:  LEFS 23/80  COGNITION: Overall cognitive status: Within functional limits for tasks assessed     SENSATION: Light  touch: Impaired  and Reduced light touch through R peroneal distribution.  Proprioception: WFL   POSTURE: anterior pelvic tilt, R hip drop in standing   PALPATION: TTP on R greater trochanter of femur  LOWER EXTREMITY ROM:  Active ROM Right eval Left eval  Hip flexion    Hip extension    Hip abduction    Hip adduction    Hip internal rotation    Hip external rotation    Knee flexion    Knee extension    Ankle dorsiflexion    Ankle plantarflexion    Ankle inversion    Ankle eversion     (Blank rows = not tested)  LOWER EXTREMITY MMT:  MMT Right eval Left eval  Hip flexion 2+ 3+  Hip extension 2+ 3+  Hip abduction 2+ 3+  Hip adduction    Hip internal rotation    Hip external rotation    Knee flexion    Knee extension 3+ 4-  Ankle dorsiflexion    Ankle plantarflexion    Ankle inversion  Ankle eversion     (Blank rows = not tested)  LOWER EXTREMITY SPECIAL TESTS:    FUNCTIONAL TESTS:  30 seconds chair stand test:6x - below average 2 minute walk test: 152ft  GAIT: Distance walked: 166ft Assistive device utilized: Quad cane small base Level of assistance: Modified independence Comments: Trendelenberg with LLE during RLE swing. Mild external rotation during stance of RLE, reduced step length on RLE. QC in LUE. One loss of balance laterally to right during stance phase on RLE.    TODAY'S TREATMENT:                                                                                                                              DATE: PT Evaluation, findings,     PATIENT EDUCATION:  Education details: PT Evaluation, findings, frequency, HEP.  Person educated: Patient Education method: Medical illustrator Education comprehension: verbalized understanding and returned demonstration  HOME EXERCISE PROGRAM: Access Code: [email or web address] URL: [email or web address] Date: 03/03/2023 Prepared by: Starling Manns  Exercises - Supine Bridge  - 1 x  daily - 7 x weekly - 3 sets - 10 reps - Clamshell  - 1 x daily - 7 x weekly - 3 sets - 10 reps  ASSESSMENT:  CLINICAL IMPRESSION: Patient is a 67 y.o. female who was seen today for physical therapy evaluation and treatment for M25.551 (ICD-10-CM) - Right hip pain. Prior to onset of hip pain, pt able to ambulate without QC and was ambulating in community without fatigue. Currenlty, pt ambulating with QC and ambulation <213ft with notable gait abnormalities. PMH includes L4-L5 fusion. Today's evaluation demonstrated significant functional transfer and ambulation capacity limitations due to significant muscle weakness in RLE, mild light touch sensation deficits demonstrated today. Deficits are contributed by potential LBP and previous fusion which contribute to complicating factor and potential source of deficits. Discussed with pt potential use of rollator instead of WC as means of improving activity tolerance while maintaining mobility level. Pt would benefit from skilled physical therapy services to address functional mobility deficits and improve function and quality of life.   OBJECTIVE IMPAIRMENTS: Abnormal gait, decreased balance, decreased cognition, decreased mobility, difficulty walking, decreased ROM, decreased strength, and impaired sensation.   ACTIVITY LIMITATIONS: lifting, bending, sitting, standing, squatting, transfers, and locomotion level  PARTICIPATION LIMITATIONS: community activity and yard work  PERSONAL FACTORS: Age, Past/current experiences, and 3+ comorbidities: Depression, tachycardia, see EMR for further PMH.   are also affecting patient's functional outcome.   REHAB POTENTIAL: Fair onset of muscle weakness potentially from Low back, pt had fusion in 2011 for L4-5  CLINICAL DECISION MAKING: Evolving/moderate complexity  EVALUATION COMPLEXITY: Moderate   GOALS: Goals reviewed with patient? No  SHORT TERM GOALS: Target date: 03/31/2023  Pt will be independent with  HEP in order to demonstrate participation in Physical Therapy POC.  Baseline: Goal status: INITIAL  2.  Pt will report 6/10 pain in right hip during functional mobility in order to demonstrate  improved pain with functional activities.  Baseline:  Goal status: INITIAL  LONG TERM GOALS: Target date: 04/28/2023  Pt will improve 30 second chair test by >3 reps in order to demonstrate improved functional strength to return to desired activities.  Baseline: 6x Goal status: INITIAL  2.  Pt will improve 2 MWT by 127ft in order to demonstrate improved functional ambulatory capacity in community setting.  Baseline: 132ft Goal status: INITIAL  3.  Pt will improve LEFS score by >10 points in order to demonstrate improved functional mobility. Baseline: see objective Goal status: INITIAL  4.  Pt will improve R Hip MMT by > 1/2 grade in order to improve knee mobility during functional activities. Baseline: see objective Goal status: INITIAL   PLAN:  PT FREQUENCY: 2x/week  PT DURATION: 8 weeks  PLANNED INTERVENTIONS: 97164- PT Re-evaluation, 97110-Therapeutic exercises, 97530- Therapeutic activity, [id number]- Neuromuscular re-education, 97535- Self Care, 60454- Manual therapy, [phone]- Gait training, Patient/Family education, Balance training, and Stair training  PLAN FOR NEXT SESSION: R hip strengthening, gait, balance.   Nelida Meuse PT, DPT Physical Therapist with [facility]eller [facility] Out[facility] Rehabilitation 336 [phone] office   Nelida Meuse, PT 03/03/2023, 1:57 PM

## 2023-03-08 ENCOUNTER — Encounter (HOSPITAL_COMMUNITY): Payer: Medicare Other

## 2023-03-10 ENCOUNTER — Encounter (HOSPITAL_COMMUNITY): Payer: Self-pay

## 2023-03-10 ENCOUNTER — Ambulatory Visit (HOSPITAL_COMMUNITY): Payer: Medicare Other

## 2023-03-10 DIAGNOSIS — M25551 Pain in right hip: Secondary | ICD-10-CM

## 2023-03-10 DIAGNOSIS — M6281 Muscle weakness (generalized): Secondary | ICD-10-CM | POA: Diagnosis not present

## 2023-03-10 NOTE — Therapy (Signed)
OUTPATIENT PHYSICAL THERAPY LOWER EXTREMITY EVALUATION   Patient Name: Jennifer Davidson MRN: [id number] DOB:1955-07-29, 67 y.o., female Today's Date: 03/10/2023  END OF SESSION:  PT End of Session - 03/10/23 1227     Visit Number 2    Number of Visits 16    Date for PT Re-Evaluation 04/28/23    Authorization Type Medicare A & B    Authorization Time Period no auth; no limit    Authorization - Visit Number 1    Progress Note Due on Visit 10    PT Start Time 1152    PT Stop Time 1225    PT Time Calculation (min) 33 min    Activity Tolerance Patient tolerated treatment well;Patient limited by fatigue    Behavior During Therapy [facility] for tasks assessed/performed              Past Medical History:  Diagnosis Date   Acute cystitis without hematuria    Acute encephalopathy 06/25/2016   Altered mental status 06/23/2017   Anxiety    Anxiety and depression 02/04/2012   Arthritis    Cataract 08/31/2017   left eye   CHF (congestive heart failure) (HCC) 2017   resolved   Cholesterol serum increased    Chronic back pain    chronic Rt low back pain. s/p L4-5 fusion. failed Rt facet injections. poss due to Rt SI joint dysfunction.   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   Depression    Diabetes mellitus without complication (HCC)    Diastolic heart failure (HCC) 03/17/2012   Edema 02/04/2012   Encounter for long-term (current) use of medications 02/04/2012   Fall as cause of accidental injury at home as place of occurrence 09/08/2018   Gastroenteritis 01/13/2021   GERD (gastroesophageal reflux disease) 11/13/2015   Hallucination, visual 09/14/2017   Head injury 09/08/2018   Hyperlipidemia    Hyperparathyroidism (HCC)    Hyperparathyroidism, 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   [facility] Irena Cords, MD  Plan:  25 Hydroxy Vit D (59.1 ng/ml)    Hypothyroidism    Moderate episode of recurrent major depressive disorder (HCC) 08/31/2016   PONV (postoperative nausea and vomiting)    Seizures (HCC)    3 years ago,2018   Sepsis (HCC) 04/29/2020   Sleep apnea    cpap   Syncope 08/30/2015   Syncope and collapse 02/12/2012   Tachycardia 02/04/2012   Trochanteric bursitis of both hips 2012   Confirmed on MRI   Past Surgical History:  Procedure Laterality Date   ABDOMINAL HYSTERECTOMY     APPENDECTOMY  1986   BACK SURGERY     L4-5 fusion   CATARACT EXTRACTION     KNEE SURGERY     REVERSE SHOULDER ARTHROPLASTY Left 07/03/2020   Procedure: REVERSE SHOULDER ARTHROPLASTY;  Surgeon: Jones Broom, MD;  Location: [facility];  Service: Orthopedics;  Laterality: Left;   REVERSE SHOULDER ARTHROPLASTY Right 03/05/2021   Procedure: REVERSE SHOULDER ARTHROPLASTY;  Surgeon: Jones Broom, MD;  Location: [facility];  Service: Orthopedics;  Laterality: Right;   SHOULDER ARTHROSCOPY WITH SUBACROMIAL DECOMPRESSION Left 10/20/2018   Procedure: SHOULDER ARTHROSCOPY, ROTATOR CUFF DEBRIDEMENT, GLENOHUMERAL JOINT DEBRIDEMENT, ACROMIOPLASTY, DISTAL CLAVICLE RESECTION;  Surgeon: Jodi Geralds, MD;  Location: [facility];  Service: Orthopedics;  Laterality: Left;   Patient Active Problem List   Diagnosis  Date Noted   Wheezing 02/22/2023   Benign paroxysmal positional vertigo 02/22/2023   Vitamin D deficiency 02/15/2023   Dizziness 06/24/2022   Recurrent oral ulcers 09/29/2021   Primary insomnia 08/27/2021   OSA (obstructive sleep apnea) 07/08/2021   Nausea and vomiting 07/08/2021   Rotator cuff tear arthropathy of right shoulder 02/20/2021   Allergies 05/06/2020   Rotator cuff tear arthropathy of left shoulder 10/20/2018   Arthritis of left acromioclavicular joint 10/20/2018   Prediabetes 09/14/2017   Dream anxiety disorder 09/14/2017   PTSD (post-traumatic stress disorder) 08/31/2016    Gastroesophageal reflux disease 11/13/2015   Generalized anxiety disorder 06/14/2015   Neuropathy of lower extremity 05/14/2015   Headache, migraine 01/17/2015   Major depression, recurrent, chronic (HCC) 12/02/2014   Mood disorder (HCC) 12/01/2014   Chronic back pain    Chronic kidney disease 02/04/2012   Hypothyroid 02/04/2012   Mixed hyperlipidemia 02/04/2012    PCP: Tollie Eth, NP  REFERRING PROVIDER: Jodi Geralds MD  REFERRING DIAG: M25.551 (ICD-10-CM) - Right hip pain   THERAPY DIAG:  Pain in right hip  Muscle weakness (generalized)  Rationale for Evaluation and Treatment: Rehabilitation  ONSET DATE: ~ 2 months ago  SUBJECTIVE:   SUBJECTIVE STATEMENT: Pt reporting she called out last session due to really bad migraine. These make her throw up and set her back a few days. Pt reporting she still is not feeling the best. 7/10 pain today. Pt reports she has slight heart murmur and is followed by cardiology.    PERTINENT HISTORY: L4-L5 Fusion in 2011 PAIN:  Are you having pain? Yes: NPRS scale: 9/10 Pain location: R hip and intermittently down into foot Pain description: sharp Aggravating factors: Long walks Relieving factors: relaxation and occasional muscle spasm meds   PRECAUTIONS: None  RED FLAGS: None   WEIGHT BEARING RESTRICTIONS: No  FALLS:  Has patient fallen in last 6 months? Yes. Number of falls 3  LIVING ENVIRONMENT: Lives with: lives with their spouse Lives in: House/apartment Stairs:  1 threshold step/ has ramp Has following equipment at home: Counselling psychologist, Environmental consultant - 2 wheeled, and Wheelchair (manual)  OCCUPATION: Retired  PLOF: Independent  PATIENT GOALS: "get better"  NEXT MD VISIT: Last of november  OBJECTIVE:  Note: Objective measures were completed at Evaluation unless otherwise noted.  DIAGNOSTIC FINDINGS:   PATIENT SURVEYS:  LEFS 23/80  COGNITION: Overall cognitive status: Within functional limits for tasks  assessed     SENSATION: Light touch: Impaired  and Reduced light touch through R peroneal distribution.  Proprioception: WFL   POSTURE: anterior pelvic tilt, R hip drop in standing   PALPATION: TTP on R greater trochanter of femur  LOWER EXTREMITY ROM:  Active ROM Right eval Left eval  Hip flexion    Hip extension    Hip abduction    Hip adduction    Hip internal rotation    Hip external rotation    Knee flexion    Knee extension    Ankle dorsiflexion    Ankle plantarflexion    Ankle inversion    Ankle eversion     (Blank rows = not tested)  LOWER EXTREMITY MMT:  MMT Right eval Left eval  Hip flexion 2+ 3+  Hip extension 2+ 3+  Hip abduction 2+ 3+  Hip adduction    Hip internal rotation    Hip external rotation    Knee flexion    Knee extension 3+ 4-  Ankle dorsiflexion    Ankle  plantarflexion    Ankle inversion    Ankle eversion     (Blank rows = not tested)  LOWER EXTREMITY SPECIAL TESTS:    FUNCTIONAL TESTS:  30 seconds chair stand test:6x - below average 2 minute walk test: 173ft  GAIT: Distance walked: 127ft Assistive device utilized: Quad cane small base Level of assistance: Modified independence Comments: Trendelenberg with LLE during RLE swing. Mild external rotation during stance of RLE, reduced step length on RLE. QC in LUE. One loss of balance laterally to right during stance phase on RLE.    TODAY'S TREATMENT:                                                                                                                              DATE:  03/10/2023  -BP 120/87 HR 114;  -Seated knee extension with 2lb ankle weight 2 x 15 w/ 3' eccentric -Seated RTB hip abduction 2 x 15 -Seated anti-rotation chess press YTB 2 x 5 bilaterally; cues for neutrla lumbopelvic alignment in sitting -Standing hip hike R side 2 x fatigue (5-6 rep)  PT Evaluation, findings,     PATIENT EDUCATION:  Education details: see HEP below.  Person educated:  Patient Education method: Medical illustrator Education comprehension: verbalized understanding and returned demonstration  HOME EXERCISE PROGRAM: Access Code: [email or web address] URL: [email or web address] Date: 03/03/2023 Prepared by: Starling Manns  Exercises - Supine Bridge  - 1 x daily - 7 x weekly - 3 sets - 10 reps - Clamshell  - 1 x daily - 7 x weekly - 3 sets - 10 reps Access Code: [email or web address] URL: [email or web address] Date: 03/10/2023 Prepared by: Starling Manns  Exercises - Seated Hip Abduction with Resistance  - 1 x daily - 7 x weekly - 3 sets - 10 reps - Standing Hip Hiking  - 1 x daily - 7 x weekly - 3 sets - 10 reps ASSESSMENT:  CLINICAL IMPRESSION: Pt was mildly limited due to fatigue and HR this session, mostly seated therapeutic exercises due to elevated HR throughout session. 7/10 pain this session, focusing on RLE strengthening. Fatigued easily this session with seated exercises. Monitored heart rate throughout session. Progress standing hip strengthening as tolerated. Pt would benefit from skilled physical therapy services to address functional mobility deficits and improve function and quality of life.   OBJECTIVE IMPAIRMENTS: Abnormal gait, decreased balance, decreased cognition, decreased mobility, difficulty walking, decreased ROM, decreased strength, and impaired sensation.   ACTIVITY LIMITATIONS: lifting, bending, sitting, standing, squatting, transfers, and locomotion level  PARTICIPATION LIMITATIONS: community activity and yard work  PERSONAL FACTORS: Age, Past/current experiences, and 3+ comorbidities: Depression, tachycardia, see EMR for further PMH.   are also affecting patient's functional outcome.   REHAB POTENTIAL: Fair onset of muscle weakness potentially from Low back, pt had fusion in 2011 for L4-5  CLINICAL DECISION MAKING: Evolving/moderate complexity  EVALUATION COMPLEXITY: Moderate   GOALS: Goals reviewed  with patient? No  SHORT TERM GOALS: Target date:  03/31/2023  Pt will be independent with HEP in order to demonstrate participation in Physical Therapy POC.  Baseline: Goal status: INITIAL  2.  Pt will report 6/10 pain in right hip during functional mobility in order to demonstrate improved pain with functional activities.  Baseline:  Goal status: INITIAL  LONG TERM GOALS: Target date: 04/28/2023  Pt will improve 30 second chair test by >3 reps in order to demonstrate improved functional strength to return to desired activities.  Baseline: 6x Goal status: INITIAL  2.  Pt will improve 2 MWT by 118ft in order to demonstrate improved functional ambulatory capacity in community setting.  Baseline: 160ft Goal status: INITIAL  3.  Pt will improve LEFS score by >10 points in order to demonstrate improved functional mobility. Baseline: see objective Goal status: INITIAL  4.  Pt will improve R Hip MMT by > 1/2 grade in order to improve knee mobility during functional activities. Baseline: see objective Goal status: INITIAL   PLAN:  PT FREQUENCY: 2x/week  PT DURATION: 8 weeks  PLANNED INTERVENTIONS: 97164- PT Re-evaluation, 97110-Therapeutic exercises, 97530- Therapeutic activity, [id number]- Neuromuscular re-education, 97535- Self Care, 16109- Manual therapy, [phone]- Gait training, Patient/Family education, Balance training, and Stair training  PLAN FOR NEXT SESSION: R hip strengthening, gait, balance.   Nelida Meuse PT, DPT Physical Therapist with [facility] Outpatient Rehabilitation 336 [phone] office   Nelida Meuse, PT 03/10/2023, 12:28 PM

## 2023-03-11 DIAGNOSIS — Z23 Encounter for immunization: Secondary | ICD-10-CM | POA: Diagnosis not present

## 2023-03-15 ENCOUNTER — Encounter (HOSPITAL_COMMUNITY): Payer: Self-pay

## 2023-03-15 DIAGNOSIS — M25551 Pain in right hip: Secondary | ICD-10-CM | POA: Diagnosis not present

## 2023-03-15 NOTE — Therapy (Signed)
[facility] [address] [facility] Outpatient Rehabilitation at [facility] [address] [place], [place], [address] Phone: [phone]   Fax:  [phone]  Patient Details  Name: Jennifer Davidson MRN: [id number] Date of Birth: June 28, 1955 Referring Provider:  No ref. provider found  Encounter Date: 03/15/2023 PHYSICAL THERAPY DISCHARGE SUMMARY  Visits from Start of Care: 2  Current functional level related to goals / functional outcomes: No notable progress toward goals as pt discharging after one treatment session.    Remaining deficits: Reduced activity tolerance Reduced ambulation capacity Reduced functional transfer capacity.    Education / Equipment: Initiated at second session. Terminated.    Patient self-discharging Patient goals were not met. Patient is being discharged due to  returning back to MD and desiring to go another route.Elie Goody, DPT Physical Therapist with [facility] Outpatient Rehabilitation 336 [phone] office   Nelida Meuse, PT 03/15/2023, 11:12 AM  [facility] Outpatient Rehabilitation at [facility] [address] [place], [place], [address] Phone: [phone]   Fax:  [phone]

## 2023-03-17 ENCOUNTER — Encounter (HOSPITAL_COMMUNITY): Payer: Medicare Other

## 2023-03-17 ENCOUNTER — Other Ambulatory Visit (HOSPITAL_BASED_OUTPATIENT_CLINIC_OR_DEPARTMENT_OTHER): Payer: Self-pay | Admitting: Nurse Practitioner

## 2023-03-17 DIAGNOSIS — E038 Other specified hypothyroidism: Secondary | ICD-10-CM

## 2023-03-17 DIAGNOSIS — M25551 Pain in right hip: Secondary | ICD-10-CM | POA: Diagnosis not present

## 2023-03-22 ENCOUNTER — Encounter (HOSPITAL_COMMUNITY): Payer: Medicare Other

## 2023-03-24 ENCOUNTER — Encounter (HOSPITAL_COMMUNITY): Payer: Medicare Other

## 2023-03-29 ENCOUNTER — Encounter (HOSPITAL_COMMUNITY): Payer: Medicare Other

## 2023-03-31 ENCOUNTER — Encounter (HOSPITAL_COMMUNITY): Payer: Medicare Other

## 2023-03-31 ENCOUNTER — Ambulatory Visit: Payer: Medicare Other | Admitting: Neurology

## 2023-04-04 DIAGNOSIS — E039 Hypothyroidism, unspecified: Secondary | ICD-10-CM | POA: Diagnosis not present

## 2023-04-04 DIAGNOSIS — I5030 Unspecified diastolic (congestive) heart failure: Secondary | ICD-10-CM | POA: Diagnosis not present

## 2023-04-04 DIAGNOSIS — E785 Hyperlipidemia, unspecified: Secondary | ICD-10-CM | POA: Diagnosis not present

## 2023-04-04 DIAGNOSIS — G709 Myoneural disorder, unspecified: Secondary | ICD-10-CM | POA: Diagnosis not present

## 2023-04-04 DIAGNOSIS — I129 Hypertensive chronic kidney disease with stage 1 through stage 4 chronic kidney disease, or unspecified chronic kidney disease: Secondary | ICD-10-CM | POA: Diagnosis not present

## 2023-04-04 DIAGNOSIS — E871 Hypo-osmolality and hyponatremia: Secondary | ICD-10-CM | POA: Diagnosis not present

## 2023-04-04 DIAGNOSIS — G4733 Obstructive sleep apnea (adult) (pediatric): Secondary | ICD-10-CM | POA: Diagnosis not present

## 2023-04-04 DIAGNOSIS — N1831 Chronic kidney disease, stage 3a: Secondary | ICD-10-CM | POA: Diagnosis not present

## 2023-04-04 LAB — CBC: RBC: 3.46 — AB (ref 3.87–5.11)

## 2023-04-04 LAB — CBC AND DIFFERENTIAL
HCT: 30 — AB (ref 36–46)
Hemoglobin: 9.9 — AB (ref 12.0–16.0)
Neutrophils Absolute: 9.2
Platelets: 520 10*3/uL — AB (ref 150–400)
WBC: 13.8

## 2023-04-04 LAB — BASIC METABOLIC PANEL
BUN: 21 (ref 4–21)
CO2: 24 — AB (ref 13–22)
Chloride: 105 (ref 99–108)
Creatinine: 1.5 — AB (ref 0.5–1.1)
Glucose: 116
Potassium: 5.1 meq/L (ref 3.5–5.1)
Sodium: 137 (ref 137–147)

## 2023-04-04 LAB — COMPREHENSIVE METABOLIC PANEL
Albumin: 3.1 — AB (ref 3.5–5.0)
Calcium: 9.2 (ref 8.7–10.7)
EGFR: 37

## 2023-04-05 ENCOUNTER — Encounter (HOSPITAL_COMMUNITY): Payer: Medicare Other

## 2023-04-05 ENCOUNTER — Telehealth: Payer: Self-pay | Admitting: Pharmacy Technician

## 2023-04-05 NOTE — Telephone Encounter (Signed)
Pharmacy Patient Advocate Encounter   Received notification from CoverMyMeds that prior authorization for Nurtec is due for renewal.   Pt hasn't been seen in a year and only filled the med 1 time, per Epic. If the pt does need the med, we will do the PA at that time.

## 2023-04-07 ENCOUNTER — Encounter (HOSPITAL_COMMUNITY): Payer: Medicare Other

## 2023-04-12 ENCOUNTER — Encounter (HOSPITAL_COMMUNITY): Payer: Medicare Other

## 2023-04-13 DIAGNOSIS — M25551 Pain in right hip: Secondary | ICD-10-CM | POA: Diagnosis not present

## 2023-04-19 ENCOUNTER — Encounter (HOSPITAL_COMMUNITY): Payer: Medicare Other

## 2023-04-21 ENCOUNTER — Encounter (HOSPITAL_COMMUNITY): Payer: Medicare Other

## 2023-04-26 ENCOUNTER — Encounter (HOSPITAL_COMMUNITY): Payer: Medicare Other

## 2023-04-28 ENCOUNTER — Encounter (HOSPITAL_COMMUNITY): Payer: Medicare Other

## 2023-05-02 ENCOUNTER — Encounter: Payer: Self-pay | Admitting: Nurse Practitioner

## 2023-05-03 ENCOUNTER — Encounter (HOSPITAL_COMMUNITY): Payer: Medicare Other

## 2023-05-04 ENCOUNTER — Other Ambulatory Visit: Payer: Self-pay | Admitting: Orthopedic Surgery

## 2023-05-04 DIAGNOSIS — M25511 Pain in right shoulder: Secondary | ICD-10-CM | POA: Diagnosis not present

## 2023-05-05 ENCOUNTER — Encounter (HOSPITAL_COMMUNITY): Payer: Medicare Other

## 2023-05-17 ENCOUNTER — Other Ambulatory Visit: Payer: Self-pay | Admitting: Neurology

## 2023-05-17 DIAGNOSIS — G43709 Chronic migraine without aura, not intractable, without status migrainosus: Secondary | ICD-10-CM

## 2023-05-24 ENCOUNTER — Ambulatory Visit
Admission: RE | Admit: 2023-05-24 | Discharge: 2023-05-24 | Disposition: A | Discharge status: Home / Self Care | Payer: Medicare Other | Source: Ambulatory Visit | Attending: Orthopedic Surgery | Admitting: Orthopedic Surgery

## 2023-05-24 DIAGNOSIS — M25511 Pain in right shoulder: Secondary | ICD-10-CM

## 2023-05-25 ENCOUNTER — Other Ambulatory Visit: Payer: Self-pay | Admitting: Neurology

## 2023-06-03 ENCOUNTER — Emergency Department (HOSPITAL_BASED_OUTPATIENT_CLINIC_OR_DEPARTMENT_OTHER): Payer: Medicare Other

## 2023-06-03 ENCOUNTER — Other Ambulatory Visit: Payer: Self-pay

## 2023-06-03 ENCOUNTER — Encounter (HOSPITAL_BASED_OUTPATIENT_CLINIC_OR_DEPARTMENT_OTHER): Payer: Self-pay

## 2023-06-03 ENCOUNTER — Emergency Department (HOSPITAL_BASED_OUTPATIENT_CLINIC_OR_DEPARTMENT_OTHER)
Admission: EM | Admit: 2023-06-03 | Discharge: 2023-06-04 | Disposition: A | Discharge status: Home / Self Care | Payer: Medicare Other | Attending: Emergency Medicine | Admitting: Emergency Medicine

## 2023-06-03 DIAGNOSIS — E1140 Type 2 diabetes mellitus with diabetic neuropathy, unspecified: Secondary | ICD-10-CM | POA: Insufficient documentation

## 2023-06-03 DIAGNOSIS — S0990XA Unspecified injury of head, initial encounter: Secondary | ICD-10-CM | POA: Diagnosis present

## 2023-06-03 DIAGNOSIS — S060XAA Concussion with loss of consciousness status unknown, initial encounter: Secondary | ICD-10-CM | POA: Diagnosis not present

## 2023-06-03 DIAGNOSIS — I5030 Unspecified diastolic (congestive) heart failure: Secondary | ICD-10-CM | POA: Diagnosis not present

## 2023-06-03 DIAGNOSIS — E039 Hypothyroidism, unspecified: Secondary | ICD-10-CM | POA: Insufficient documentation

## 2023-06-03 DIAGNOSIS — Z7984 Long term (current) use of oral hypoglycemic drugs: Secondary | ICD-10-CM | POA: Diagnosis not present

## 2023-06-03 DIAGNOSIS — E1122 Type 2 diabetes mellitus with diabetic chronic kidney disease: Secondary | ICD-10-CM | POA: Insufficient documentation

## 2023-06-03 DIAGNOSIS — R471 Dysarthria and anarthria: Secondary | ICD-10-CM | POA: Insufficient documentation

## 2023-06-03 DIAGNOSIS — E1165 Type 2 diabetes mellitus with hyperglycemia: Secondary | ICD-10-CM | POA: Insufficient documentation

## 2023-06-03 DIAGNOSIS — N189 Chronic kidney disease, unspecified: Secondary | ICD-10-CM | POA: Diagnosis not present

## 2023-06-03 DIAGNOSIS — Z7982 Long term (current) use of aspirin: Secondary | ICD-10-CM | POA: Diagnosis not present

## 2023-06-03 DIAGNOSIS — Z79899 Other long term (current) drug therapy: Secondary | ICD-10-CM | POA: Diagnosis not present

## 2023-06-03 DIAGNOSIS — W01198A Fall on same level from slipping, tripping and stumbling with subsequent striking against other object, initial encounter: Secondary | ICD-10-CM | POA: Insufficient documentation

## 2023-06-03 DIAGNOSIS — Y900 Blood alcohol level of less than 20 mg/100 ml: Secondary | ICD-10-CM | POA: Diagnosis not present

## 2023-06-03 DIAGNOSIS — S060X9A Concussion with loss of consciousness of unspecified duration, initial encounter: Secondary | ICD-10-CM

## 2023-06-03 LAB — URINALYSIS, ROUTINE W REFLEX MICROSCOPIC
Bacteria, UA: NONE SEEN
Bilirubin Urine: NEGATIVE
Glucose, UA: NEGATIVE mg/dL
Hgb urine dipstick: NEGATIVE
Ketones, ur: NEGATIVE mg/dL
Nitrite: NEGATIVE
Protein, ur: NEGATIVE mg/dL
Specific Gravity, Urine: 1.023 (ref 1.005–1.030)
pH: 5.5 (ref 5.0–8.0)

## 2023-06-03 LAB — CBC
HCT: 33.2 % — ABNORMAL LOW (ref 36.0–46.0)
Hemoglobin: 10.7 g/dL — ABNORMAL LOW (ref 12.0–15.0)
MCH: 29.2 pg (ref 26.0–34.0)
MCHC: 32.2 g/dL (ref 30.0–36.0)
MCV: 90.5 fL (ref 80.0–100.0)
Platelets: 374 10*3/uL (ref 150–400)
RBC: 3.67 MIL/uL — ABNORMAL LOW (ref 3.87–5.11)
RDW: 18.5 % — ABNORMAL HIGH (ref 11.5–15.5)
WBC: 11.8 10*3/uL — ABNORMAL HIGH (ref 4.0–10.5)
nRBC: 0 % (ref 0.0–0.2)

## 2023-06-03 LAB — RAPID URINE DRUG SCREEN, HOSP PERFORMED
Amphetamines: NOT DETECTED
Barbiturates: NOT DETECTED
Benzodiazepines: POSITIVE — AB
Cocaine: NOT DETECTED
Opiates: NOT DETECTED
Tetrahydrocannabinol: NOT DETECTED

## 2023-06-03 LAB — APTT: aPTT: 25 s (ref 24–36)

## 2023-06-03 LAB — PROTIME-INR
INR: 0.8 (ref 0.8–1.2)
Prothrombin Time: 11.8 s (ref 11.4–15.2)

## 2023-06-03 LAB — BASIC METABOLIC PANEL
Anion gap: 8 (ref 5–15)
BUN: 28 mg/dL — ABNORMAL HIGH (ref 8–23)
CO2: 23 mmol/L (ref 22–32)
Calcium: 8.8 mg/dL — ABNORMAL LOW (ref 8.9–10.3)
Chloride: 104 mmol/L (ref 98–111)
Creatinine, Ser: 1.54 mg/dL — ABNORMAL HIGH (ref 0.44–1.00)
GFR, Estimated: 36 mL/min — ABNORMAL LOW (ref >60)
Glucose, Bld: 81 mg/dL (ref 70–99)
Potassium: 4.3 mmol/L (ref 3.5–5.1)
Sodium: 135 mmol/L (ref 135–145)

## 2023-06-03 LAB — ETHANOL: Alcohol, Ethyl (B): <10 mg/dL (ref <10)

## 2023-06-03 LAB — CBG MONITORING, ED: Glucose-Capillary: 103 mg/dL — ABNORMAL HIGH (ref 70–99)

## 2023-06-03 MED ORDER — DIVALPROEX SODIUM ER 250 MG PO TB24: 250.0000 mg | ORAL_TABLET | Freq: Every day | ORAL | 0 refills | Status: DC

## 2023-06-03 MED ORDER — IOHEXOL 350 MG/ML SOLN
100.0000 mL | Freq: Once | INTRAVENOUS | Status: AC | PRN
  Administered 2023-06-03: 75 mL via INTRAVENOUS

## 2023-06-03 NOTE — ED Provider Notes (Signed)
[facility]ERGENCY DEPARTMENT AT [facility] Provider Note  CSN: [id number] Arrival date & time: 06/03/23 2020  Chief Complaint(s) Loss of Consciousness  HPI Jennifer Davidson is a 68 y.o. female with past medical history as below, significant for anxiety, depression, CHF, CKD, conversion disorder, DM, diastolic heart failure, GERD, hallucinations, OSA on CPAP, HLD who presents to the ED with complaint of fall, head injury, syncope, speech changes  She is here with spouse, patient had LOC around 3 PM, fell and hit her head.  Felt lightheaded prior to the fall, thought she is going to pass out.  At around 7:30 PM she had some slurred speech/dysarthria, seemed confused for the symptoms last around 30 minutes.  Symptoms at this time have resolved.  Speech is back to baseline.  She was complaining of headache just prior to arrival but has since resolved.  She feels back to normal at this time.   She is wearing sling RUE from recent clavicle fx  Past Medical History Past Medical History:  Diagnosis Date   Acute cystitis without hematuria    Acute encephalopathy 06/25/2016   Altered mental status 06/23/2017   Anxiety    Anxiety and depression 02/04/2012   Arthritis    Cataract 08/31/2017   left eye   CHF (congestive heart failure) (HCC) 2017   resolved   Cholesterol serum increased    Chronic back pain    chronic Rt low back pain. s/p L4-5 fusion. failed Rt facet injections. poss due to Rt SI joint dysfunction.   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   Depression    Diabetes mellitus without complication (HCC)    Diastolic heart failure (HCC) 03/17/2012   Edema 02/04/2012   Encounter for long-term (current) use of medications 02/04/2012   Fall as cause of accidental injury at home as place of occurrence 09/08/2018    Gastroenteritis 01/13/2021   GERD (gastroesophageal reflux disease) 11/13/2015   Hallucination, visual 09/14/2017   Head injury 09/08/2018   Hyperlipidemia    Hyperparathyroidism (HCC)    Hyperparathyroidism, 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    [facility] Irena Cords, MD  Plan:  25 Hydroxy Vit D (59.1 ng/ml)    Hypothyroidism    Moderate episode of recurrent major depressive disorder (HCC) 08/31/2016   PONV (postoperative nausea and vomiting)    Seizures (HCC)    3 years ago,2018   Sepsis (HCC) 04/29/2020   Sleep apnea    cpap   Syncope 08/30/2015   Syncope and collapse 02/12/2012   Tachycardia 02/04/2012   Trochanteric bursitis of both hips 2012   Confirmed on MRI   Patient Active Problem List   Diagnosis Date Noted   Wheezing 02/22/2023   Benign paroxysmal positional vertigo 02/22/2023   Vitamin D deficiency 02/15/2023   Dizziness 06/24/2022   Recurrent oral ulcers 09/29/2021   Primary insomnia 08/27/2021   OSA (obstructive sleep apnea) 07/08/2021   Nausea and vomiting 07/08/2021   Rotator cuff tear arthropathy of right shoulder 02/20/2021   Allergies 05/06/2020   Rotator cuff tear arthropathy of left shoulder 10/20/2018   Arthritis of left acromioclavicular joint 10/20/2018   Prediabetes 09/14/2017   Dream anxiety disorder 09/14/2017   PTSD (post-traumatic stress disorder) 08/31/2016   Gastroesophageal reflux disease 11/13/2015   Generalized anxiety disorder 06/14/2015   Neuropathy of lower extremity  05/14/2015   Headache, migraine 01/17/2015   Major depression, recurrent, chronic (HCC) 12/02/2014   Mood disorder (HCC) 12/01/2014   Chronic back pain    Chronic kidney disease 02/04/2012   Hypothyroid 02/04/2012   Mixed hyperlipidemia 02/04/2012   Home Medication(s) Prior to Admission medications   Medication Sig Start Date End Date Taking? Authorizing Provider  divalproex (DEPAKOTE ER) 250 MG 24 hr tablet Take 1 tablet (250 mg total) by  mouth daily for 14 days. 06/03/23 06/17/23 Yes Tanda Rockers A, DO  albuterol (VENTOLIN HFA) 108 (90 Base) MCG/ACT inhaler TAKE 2 PUFFS BY MOUTH EVERY 6 HOURS AS NEEDED FOR WHEEZE OR SHORTNESS OF BREATH 02/15/23   Early, Sung Amabile, NP  ALPRAZolam (XANAX) 0.5 MG tablet MAY TAKE ONE TABLET UP TO 2 TIMES A DAY FOR ANXIETY. AVOID TAKING DAILY, IF POSSIBLE. 02/07/23   Early, Sung Amabile, NP  AMBULATORY NON FORMULARY MEDICATION Continuous positive airway pressure (CPAP) machine set at 15 cm of H2O pressure, with all supplemental supplies as needed. 05/26/21   Tollie Eth, NP  AMBULATORY NON FORMULARY MEDICATION Continuous positive airway pressure (CPAP) machine set at 15 cm of H2O pressure. Supplemental supplies: Headgear with q 6 months, Full face mask q 3 months, Full face cushion q 3 months, 1 standard tubing q 3 months, 1 water chamber q 6 months. 1 reusable filter q 6 months, 6 disposable filters q 3 months. Mask F-29. 06/01/21   Tollie Eth, NP  aspirin EC 81 MG tablet Take 1 tablet (81 mg total) by mouth daily. 07/04/19   Quintella Reichert, MD  atorvastatin (LIPITOR) 20 MG tablet  03/27/21   [provider]  blood glucose meter kit and supplies KIT Dispense based on patient and insurance preference. Use up to four times daily as directed. Check blood sugar three times weekly, then as needed. 07/28/20   Tollie Eth, NP  buPROPion (WELLBUTRIN XL) 150 MG 24 hr tablet TAKE 1 TABLET BY MOUTH EVERY DAY 07/23/22   Early, Sung Amabile, NP  busPIRone (BUSPAR) 7.5 MG tablet TAKE 2 TABLETS (15 MG TOTAL) BY MOUTH 3 (THREE) TIMES DAILY. 10/07/22   Tollie Eth, NP  cetirizine (ZYRTEC) 10 MG tablet TAKE 1 TABLET BY MOUTH EVERY DAY 06/26/22   Early, Sung Amabile, NP  diclofenac sodium (VOLTAREN) 1 % GEL Apply 2 g topically daily as needed (for knee pain). 08/23/18   Noni Saupe, MD  DULoxetine (CYMBALTA) 60 MG capsule TAKE 1 CAPSULE BY MOUTH EVERY DAY 07/23/22   Early, Sung Amabile, NP  famotidine (PEPCID) 20 MG tablet TAKE 1 TABLET BY  MOUTH TWICE A DAY 01/12/23   Early, Sung Amabile, NP  fluconazole (DIFLUCAN) 150 MG tablet Take one tablet by mouth at the first sign of symptoms of yeast. If no resolution, repeat dose in 72 hours. 04/20/22   Tollie Eth, NP  fluticasone (FLONASE) 50 MCG/ACT nasal spray PLACE 2 SPRAYS AT BEDTIME INTO BOTH NOSTRILS. 02/15/23   Tollie Eth, NP  gabapentin (NEURONTIN) 600 MG tablet TAKE 1 TABLET (600 MG TOTAL) BY MOUTH EVERY MORNING AND 1.5 TABLETS (900 MG TOTAL) AT BEDTIME. 07/29/22   Early, Sung Amabile, NP  hydrOXYzine (VISTARIL) 50 MG capsule TAKE 1 CAPSULE (50 MG TOTAL) BY MOUTH EVERY 6 (SIX) HOURS AS NEEDED. 01/06/23   Early, Sung Amabile, NP  levothyroxine (SYNTHROID) 50 MCG tablet TAKE 1 TABLET (50 MCG TOTAL) BY MOUTH EVERY OTHER DAY. BEFORE BREAKFAST. ALTERNATE WITH TABLET 01/12/23  Tollie Eth, NP  levothyroxine (SYNTHROID) 75 MCG tablet TAKE 1 TABLET (75 MCG TOTAL) BY MOUTH EVERY OTHER DAY. BEFORE BREAKFAST. ALTERNATE WITH TABLET 03/17/23   Early, Sung Amabile, NP  meclizine (ANTIVERT) 50 MG tablet Take 0.5 tablets (25 mg total) by mouth 2 (two) times daily as needed for dizziness. Patient taking differently: Take 50 mg by mouth 2 (two) times daily as needed for dizziness. 05/21/20   Just, Azalee Course, FNP  metFORMIN (GLUCOPHAGE) 500 MG tablet TAKE 1 TABLET BY MOUTH 2 TIMES DAILY WITH A MEAL. 06/26/22   Tollie Eth, NP  metoCLOPramide (REGLAN) 10 MG tablet TAKE 0.5 TABLET BY MOUTH 3 (THREE) TIMES DAILY AS NEEDED FOR NAUSEA OR VOMITING (NAUSEA/REFLUX). 02/04/23   Tollie Eth, NP  Multiple Vitamins-Minerals (MULTIVITAMIN WITH MINERALS) tablet Take 1 tablet by mouth daily.    [provider]  ondansetron (ZOFRAN-ODT) 8 MG disintegrating tablet Take 1 tablet (8 mg total) by mouth every 8 (eight) hours as needed for nausea or vomiting. 02/15/23   Tollie Eth, NP  pantoprazole (PROTONIX) 40 MG tablet TAKE 1 TABLET BY MOUTH TWICE A DAY 10/04/22   Early, Sung Amabile, NP  Polyvinyl Alcohol-Povidone PF (REFRESH)  1.4-0.6 % SOLN Place 1 drop into both eyes daily as needed (dry eyes).    [provider]  Probiotic Product (ALIGN) 4 MG CAPS Take 1 capsule (4 mg total) by mouth daily. 04/17/20   Jacalyn Lefevre, MD  promethazine (PHENERGAN) 12.5 MG tablet Take 1 tablet (12.5 mg total) by mouth every 8 (eight) hours as needed for nausea or vomiting. 02/15/23   Tollie Eth, NP  promethazine (PHENERGAN) 25 MG suppository Place 1 suppository (25 mg total) rectally every 6 (six) hours as needed for nausea or vomiting. 10/23/21   Tollie Eth, NP  QUEtiapine (SEROQUEL) 300 MG tablet TAKE 1 TABLET (300 MG TOTAL) BY MOUTH AT BEDTIME. 06/21/22   Early, Sung Amabile, NP  Rimegepant Sulfate (NURTEC) 75 MG TBDP Take 75 mg by mouth daily as needed. 03/31/22   Everlena Cooper, Adam R, DO  rizatriptan (MAXALT-MLT) 10 MG disintegrating tablet TAKE 1 TABLET BY MOUTH AS NEEDED FOR MIGRAINE. MAY REPEAT IN 2 HOURS IF NEEDED 05/17/23   Everlena Cooper, Adam R, DO  trolamine salicylate (ASPERCREME) 10 % cream Apply 1 application topically as needed for muscle pain.    [provider]  valACYclovir (VALTREX) 500 MG tablet Take 1 tablet (500 mg total) by mouth daily. 12/25/21   Tollie Eth, NP  zonisamide (ZONEGRAN) 100 MG capsule TAKE 1 CAPSULE BY MOUTH EVERY DAY 05/25/23   Drema Dallas, DO                                                                                                                                    Past Surgical History Past Surgical History:  Procedure Laterality Date   ABDOMINAL HYSTERECTOMY  APPENDECTOMY  1986   BACK SURGERY     L4-5 fusion   CATARACT EXTRACTION     KNEE SURGERY     REVERSE SHOULDER ARTHROPLASTY Left 07/03/2020   Procedure: REVERSE SHOULDER ARTHROPLASTY;  Surgeon: Jones Broom, MD;  Location: [facility];  Service: Orthopedics;  Laterality: Left;   REVERSE SHOULDER ARTHROPLASTY Right 03/05/2021   Procedure: REVERSE SHOULDER ARTHROPLASTY;  Surgeon: Jones Broom, MD;  Location: [facility];  Service:  Orthopedics;  Laterality: Right;   SHOULDER ARTHROSCOPY WITH SUBACROMIAL DECOMPRESSION Left 10/20/2018   Procedure: SHOULDER ARTHROSCOPY, ROTATOR CUFF DEBRIDEMENT, GLENOHUMERAL JOINT DEBRIDEMENT, ACROMIOPLASTY, DISTAL CLAVICLE RESECTION;  Surgeon: Jodi Geralds, MD;  Location: [facility];  Service: Orthopedics;  Laterality: Left;   Family History Family History  Problem Relation Age of Onset   Diabetes Mother    Heart disease Mother    Kidney disease Mother    Thyroid disease Mother    Heart disease Father    Seizures Father    COPD Father    Irritable bowel syndrome Father    Cancer Maternal Grandmother    Diabetes Sister    Diabetes Brother    Colon cancer Neg Hx    Stomach cancer Neg Hx    Colon polyps Neg Hx    Esophageal cancer Neg Hx    Rectal cancer Neg Hx     Social History Social History   Tobacco Use   Smoking status: Never   Smokeless tobacco: Never  Vaping Use   Vaping status: Never Used  Substance Use Topics   Alcohol use: Never   Drug use: Never    Comment: 08-31-2016 PER PT NO    Allergies Tramadol and Trazodone and nefazodone  Review of Systems Review of Systems  Constitutional:  Negative for chills and fever.  Respiratory:  Negative for chest tightness and shortness of breath.   Cardiovascular:  Negative for chest pain.  Gastrointestinal:  Negative for abdominal pain, nausea and vomiting.  Genitourinary:  Negative for dysuria.  Skin:  Negative for rash.  Neurological:  Positive for syncope, speech difficulty, light-headedness and headaches.  All other systems reviewed and are negative.   Physical Exam Vital Signs  I have reviewed the triage vital signs BP (!) 145/82   Pulse 85   Temp 98 F (36.7 C) (Oral)   Resp 13   Ht 5\' 2"  (1.575 m)   Wt 77.1 kg   SpO2 97%   BMI 31.09 kg/m  Physical Exam Vitals and nursing note reviewed.  Constitutional:      General: She is not in acute distress.    Appearance: Normal appearance.  HENT:     Head:  Normocephalic and atraumatic.     Right Ear: External ear normal.     Left Ear: External ear normal.     Nose: Nose normal.     Mouth/Throat:     Mouth: Mucous membranes are moist.  Eyes:     General: No scleral icterus.       Right eye: No discharge.        Left eye: No discharge.     Extraocular Movements: Extraocular movements intact.     Pupils: Pupils are equal, round, and reactive to light.  Cardiovascular:     Rate and Rhythm: Normal rate and regular rhythm.     Pulses: Normal pulses.     Heart sounds: Normal heart sounds.  Pulmonary:     Effort: Pulmonary effort is normal. No respiratory distress.     Breath  sounds: Normal breath sounds. No stridor.  Abdominal:     General: Abdomen is flat. There is no distension.     Palpations: Abdomen is soft.     Tenderness: There is no abdominal tenderness.  Musculoskeletal:     Cervical back: No rigidity.     Right lower leg: No edema.     Left lower leg: No edema.     Comments: Sling right shoulder  Skin:    General: Skin is warm and dry.     Capillary Refill: Capillary refill takes less than 2 seconds.  Neurological:     Mental Status: She is alert and oriented to person, place, and time. Mental status is at baseline.     GCS: GCS eye subscore is 4. GCS verbal subscore is 5. GCS motor subscore is 6.     Cranial Nerves: Cranial nerves 2-12 are intact. No dysarthria or facial asymmetry.     Sensory: Sensation is intact. No sensory deficit.     Motor: Motor function is intact. No tremor.     Comments: Difficult to complete coordination testing secondary to right arm injury.    Psychiatric:        Mood and Affect: Mood normal.        Behavior: Behavior normal. Behavior is cooperative.     ED Results and Treatments Labs (all labs ordered are listed, but only abnormal results are displayed) Labs Reviewed  BASIC METABOLIC PANEL - Abnormal; Notable for the following components:      Result Value   BUN 28 (*)    Creatinine,  Ser 1.54 (*)    Calcium 8.8 (*)    GFR, Estimated 36 (*)    All other components within normal limits  CBC - Abnormal; Notable for the following components:   WBC 11.8 (*)    RBC 3.67 (*)    Hemoglobin 10.7 (*)    HCT 33.2 (*)    RDW 18.5 (*)    All other components within normal limits  URINALYSIS, ROUTINE W REFLEX MICROSCOPIC - Abnormal; Notable for the following components:   Leukocytes,Ua TRACE (*)    All other components within normal limits  RAPID URINE DRUG SCREEN, HOSP PERFORMED - Abnormal; Notable for the following components:   Benzodiazepines POSITIVE (*)    All other components within normal limits  CBG MONITORING, ED - Abnormal; Notable for the following components:   Glucose-Capillary 103 (*)    All other components within normal limits  ETHANOL  PROTIME-INR  APTT  CBG MONITORING, ED                                                                                                                          Radiology CT ANGIO HEAD NECK W WO CM Result Date: 06/03/2023 CLINICAL DATA:  Transient ischemic attack EXAM: CT ANGIOGRAPHY HEAD AND NECK WITH AND WITHOUT CONTRAST TECHNIQUE: Multidetector CT imaging of the head and neck was performed using the standard protocol during bolus  administration of intravenous contrast. Multiplanar CT image reconstructions and MIPs were obtained to evaluate the vascular anatomy. Carotid stenosis measurements (when applicable) are obtained utilizing NASCET criteria, using the distal internal carotid diameter as the denominator. RADIATION DOSE REDUCTION: This exam was performed according to the departmental dose-optimization program which includes automated exposure control, adjustment of the mA and/or kV according to patient size and/or use of iterative reconstruction technique. CONTRAST:  75mL OMNIPAQUE IOHEXOL 350 MG/ML SOLN COMPARISON:  06/23/2017 FINDINGS: CT HEAD FINDINGS Brain: No mass,hemorrhage or extra-axial collection. Normal appearance of  the parenchyma and CSF spaces. Vascular: No hyperdense vessel or unexpected vascular calcification. Skull: The visualized skull base, calvarium and extracranial soft tissues are normal. Sinuses/Orbits: No fluid levels or advanced mucosal thickening of the visualized paranasal sinuses. No mastoid or middle ear effusion. Normal orbits. CTA NECK FINDINGS Skeleton: No acute abnormality or high grade bony spinal canal stenosis. Other neck: Normal pharynx, larynx and major salivary glands. No cervical lymphadenopathy. Unremarkable thyroid gland. Upper chest: No pneumothorax or pleural effusion. No nodules or masses. Aortic arch: There is no calcific atherosclerosis of the aortic arch. Normal variant aortic arch branching pattern with the brachiocephalic and left common carotid arteries sharing a common origin. RIGHT carotid system: No dissection, occlusion or aneurysm. Mild atherosclerotic calcification at the carotid bifurcation without hemodynamically significant stenosis. LEFT carotid system: No dissection, occlusion or aneurysm. Mild atherosclerotic calcification at the carotid bifurcation without hemodynamically significant stenosis. Vertebral arteries: Codominant configuration. There is no dissection, occlusion or flow-limiting stenosis to the skull base (V1-V3 segments). CTA HEAD FINDINGS POSTERIOR CIRCULATION: Vertebral arteries are normal. No proximal occlusion of the anterior or inferior cerebellar arteries. Basilar artery is normal. Superior cerebellar arteries are normal. Posterior cerebral arteries are normal. ANTERIOR CIRCULATION: Intracranial internal carotid arteries are normal. Anterior cerebral arteries are normal. Middle cerebral arteries are normal. Venous sinuses: As permitted by contrast timing, patent. Anatomic variants: None Review of the MIP images confirms the above findings. IMPRESSION: 1. No emergent large vessel occlusion or hemodynamically significant stenosis of the head or neck. 2. Mild  bilateral carotid bifurcation atherosclerosis without hemodynamically significant stenosis. Electronically Signed   By: Deatra Robinson M.D.   On: 06/03/2023 22:29   CT Cervical Spine Wo Contrast Result Date: 06/03/2023 CLINICAL DATA:  Syncopal episode, facial trauma, blunt. EXAM: CT CERVICAL SPINE WITHOUT CONTRAST TECHNIQUE: Multidetector CT imaging of the cervical spine was performed without intravenous contrast. Multiplanar CT image reconstructions were also generated. RADIATION DOSE REDUCTION: This exam was performed according to the departmental dose-optimization program which includes automated exposure control, adjustment of the mA and/or kV according to patient size and/or use of iterative reconstruction technique. COMPARISON:  None Available. FINDINGS: Alignment: Normal.  There is loss of normal cervical lordosis. Skull base and vertebrae: No acute fracture in the cervical spine. There is partial visualization of a fracture of the scapula on the right, seen on the previous exam. There is a displaced corticated fracture of the medial head of the right clavicle, only partially visualized on exam, and may be chronic. Soft tissues and spinal canal: No prevertebral fluid or swelling. No visible canal hematoma. Disc levels: Multilevel intervertebral disc space narrowing, degenerative endplate changes, and facet arthropathy. Upper chest: No acute abnormality. Other: None. IMPRESSION: 1. Degenerative changes in the cervical spine without evidence of acute fracture. 2. Partial visualization of a fracture of the right scapula. 3. Displaced corticated fracture of the medial clavicle, partially visualized on exam, and may be chronic. Electronically Signed  By: Thornell Sartorius M.D.   On: 06/03/2023 22:10   DG Chest Port 1 View Result Date: 06/03/2023 CLINICAL DATA:  Fall EXAM: PORTABLE CHEST 1 VIEW COMPARISON:  03/03/2021 FINDINGS: The heart size and mediastinal contours are within normal limits. No focal airspace  consolidation, pleural effusion, or pneumothorax. Bilateral reverse shoulder arthroplasties. IMPRESSION: No active disease. Electronically Signed   By: Duanne Guess D.O.   On: 06/03/2023 21:18    Pertinent labs & imaging results that were available during my care of the patient were reviewed by me and considered in my medical decision making (see MDM for details).  Medications Ordered in ED Medications  iohexol (OMNIPAQUE) 350 MG/ML injection 100 mL (75 mLs Intravenous Contrast Given 06/03/23 2136)                                                                                                                                     Procedures Procedures  (including critical care time)  Medical Decision Making / ED Course    Medical Decision Making:    TAKELA GANTER is a 68 y.o. female with past medical history as below, significant for anxiety, depression, CHF, CKD, conversion disorder, DM, diastolic heart failure, GERD, hallucinations, OSA on CPAP, HLD who presents to the ED with complaint of fall, head injury, syncope, speech changes. The complaint involves an extensive differential diagnosis and also carries with it a high risk of complications and morbidity.  Serious etiology was considered. Ddx includes but is not limited to: Differential diagnoses for head trauma includes subdural hematoma, epidural hematoma, acute concussion, traumatic subarachnoid hemorrhage, cerebral contusions, etc. Differential diagnosis includes but is not exclusive to subarachnoid hemorrhage, meningitis, encephalitis, previous head trauma, cavernous venous thrombosis, muscle tension headache, glaucoma, temporal arteritis, migraine or migraine equivalent, etc.   Complete initial physical exam performed, notably the patient was in no acute distress, back to baseline.    Reviewed and confirmed nursing documentation for past medical history, family history, social history.  Vital signs reviewed.      Clinical  Course as of 06/04/23 1441  Fri Jun 03, 2023  2145 Creatinine(!): 1.54 Similar 3 months ago [SG]  2245 Depakote x2 wks per Dr Derry Lory [SG]    Clinical Course User Index [SG] Sloan Leiter, DO    Brief summary: 68 year old female history as above including CHF, conversion disorder, diastolic heart failure, hallucinations here with fall, head injury, speech changes.  Symptoms have since resolved.  Last approximate 30 minutes.  Family denies any weakness or numbness to extremities, no vision changes at time of dysarthria.  She is back to baseline currently, no thinners.  NIH stroke score of 0, not candidate for TNK given resolution of symptoms, not candidate for mechanical thrombectomy given resolution of symptoms.  She is feeling better, back to baseline  D/w neuro dr Derry Lory, concern for possible concussion/seizure post head injury. Recommend start depakote and f/u in  office. CTA reviewed, no need for MRI per neuro. Seizure precautions to pt/family  Patient presents with syncopal symptoms without worrisome features. Presentation most suggestive of neuro-cardiogenic or orthostatic cause. Very low suspicion for serious arrhythmia, cardiac ischemia or other serious etiology. ECG reviewed, no evidence of a cardiac arrhythmia such as Brugada, WPW, HOCM, IHSS, Long or short QT. Neurologic exam is nonfocal, not consistent with CVA or primary neurologic abnormality. Patient appears safe for discharge with outpatient observation and close PCP F/U. Syncope warnings discussed with patient. The patient has been instructed to return immediately if the symptoms worsen in any way. Patient verbalized understanding and is in agreement with current care plan. All questions answered prior to discharge.              Additional history obtained: -Additional history obtained from family -External records from outside source obtained and reviewed including: Chart review including previous notes,  labs, imaging, consultation notes including  Primary care documentation, home medications   Lab Tests: -I ordered, reviewed, and interpreted labs.   The pertinent results include:   Labs Reviewed  BASIC METABOLIC PANEL - Abnormal; Notable for the following components:      Result Value   BUN 28 (*)    Creatinine, Ser 1.54 (*)    Calcium 8.8 (*)    GFR, Estimated 36 (*)    All other components within normal limits  CBC - Abnormal; Notable for the following components:   WBC 11.8 (*)    RBC 3.67 (*)    Hemoglobin 10.7 (*)    HCT 33.2 (*)    RDW 18.5 (*)    All other components within normal limits  URINALYSIS, ROUTINE W REFLEX MICROSCOPIC - Abnormal; Notable for the following components:   Leukocytes,Ua TRACE (*)    All other components within normal limits  RAPID URINE DRUG SCREEN, HOSP PERFORMED - Abnormal; Notable for the following components:   Benzodiazepines POSITIVE (*)    All other components within normal limits  CBG MONITORING, ED - Abnormal; Notable for the following components:   Glucose-Capillary 103 (*)    All other components within normal limits  ETHANOL  PROTIME-INR  APTT  CBG MONITORING, ED    Notable for labs stable  EKG   EKG Interpretation Date/Time:  Friday June 03 2023 20:30:54 EST Ventricular Rate:  95 PR Interval:  136 QRS Duration:  76 QT Interval:  390 QTC Calculation: 490 R Axis:   50  Text Interpretation: Normal sinus rhythm T wave abnormality, consider lateral ischemia Abnormal ECG When compared with ECG of 01-May-2020 11:21, T wave inversion now evident in Lateral leads \ Interpretation limited secondary to artifact Confirmed by Tanda Rockers ([id number]) on 06/03/2023 8:39:17 PM         Imaging Studies ordered: I ordered imaging studies including chest x-ray, CT head and neck, CT angio head and neck I independently visualized the following imaging with scope of interpretation limited to determining acute life threatening conditions  related to emergency care; findings noted above I independently visualized and interpreted imaging. I agree with the radiologist interpretation   Medicines ordered and prescription drug management: Meds ordered this encounter  Medications   iohexol (OMNIPAQUE) 350 MG/ML injection 100 mL   divalproex (DEPAKOTE ER) 250 MG 24 hr tablet    Sig: Take 1 tablet (250 mg total) by mouth daily for 14 days.    Dispense:  14 tablet    Refill:  0    -I have reviewed the patients  home medicines and have made adjustments as needed   Consultations Obtained: I requested consultation with the neuro,  and discussed lab and imaging findings as well as pertinent plan - they recommend: start depakote as above, dc   Cardiac Monitoring: The patient was maintained on a cardiac monitor.  I personally viewed and interpreted the cardiac monitored which showed an underlying rhythm of: NSR Continuous pulse oximetry interpreted by myself, 100% on RA.    Social Determinants of Health:  Diagnosis or treatment significantly limited by social determinants of health: obesity   Reevaluation: After the interventions noted above, I reevaluated the patient and found that they have resolved  Co morbidities that complicate the patient evaluation  Past Medical History:  Diagnosis Date   Acute cystitis without hematuria    Acute encephalopathy 06/25/2016   Altered mental status 06/23/2017   Anxiety    Anxiety and depression 02/04/2012   Arthritis    Cataract 08/31/2017   left eye   CHF (congestive heart failure) (HCC) 2017   resolved   Cholesterol serum increased    Chronic back pain    chronic Rt low back pain. s/p L4-5 fusion. failed Rt facet injections. poss due to Rt SI joint dysfunction.   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   Depression     Diabetes mellitus without complication (HCC)    Diastolic heart failure (HCC) 03/17/2012   Edema 02/04/2012   Encounter for long-term (current) use of medications 02/04/2012   Fall as cause of accidental injury at home as place of occurrence 09/08/2018   Gastroenteritis 01/13/2021   GERD (gastroesophageal reflux disease) 11/13/2015   Hallucination, visual 09/14/2017   Head injury 09/08/2018   Hyperlipidemia    Hyperparathyroidism (HCC)    Hyperparathyroidism, 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   [facility] Irena Cords, MD  Plan:  25 Hydroxy Vit D (59.1 ng/ml)    Hypothyroidism    Moderate episode of recurrent major depressive disorder (HCC) 08/31/2016   PONV (postoperative nausea and vomiting)    Seizures (HCC)    3 years ago,2018   Sepsis (HCC) 04/29/2020   Sleep apnea    cpap   Syncope 08/30/2015   Syncope and collapse 02/12/2012   Tachycardia 02/04/2012   Trochanteric bursitis of both hips 2012   Confirmed on MRI      Dispostion: Disposition decision including need for hospitalization was considered, and patient discharged from emergency department.    Final Clinical Impression(s) / ED Diagnoses Final diagnoses:  Injury of head, initial encounter  Concussion with loss of consciousness, initial encounter  Dysarthria        Sloan Leiter, DO 06/04/23 1441

## 2023-06-03 NOTE — ED Triage Notes (Signed)
Pt POV from home with husband d/t having a syncopal episode at 1500 and at 1930 she appeared to have slurred speech and disoriented.  All s/s resolved at 2000 but daughter wanted to her  be evaluated. Pt did hit head at 1500 when she fell but denies being on blood thinners. Pt has had syncopal episodes before d/t dehydration.

## 2023-06-03 NOTE — Discharge Instructions (Addendum)
Your symptoms today may be secondary to a seizure. You have been started on a medicine called depakote to take over the next two weeks. I have referred you to neurology, they should call early next week to arrange the appointment.   It was a pleasure caring for you today in the emergency department.  Please return to the emergency department for any worsening or worrisome symptoms.       Per [facility] statutes, patients with seizures are not allowed to drive until they have been seizure-free for six months.  Other recommendations include using caution when using heavy equipment or power tools. Avoid working on ladders or at heights. Take showers instead of baths.  Do not swim alone.  Ensure the water temperature is not too high on the home water heater. Do not go swimming alone. Do not lock yourself in a room alone (i.e. bathroom). When caring for infants or small children, sit down when holding, feeding, or changing them to minimize risk of injury to the child in the event you have a seizure. Maintain good sleep hygiene. Avoid alcohol.  Also recommend adequate sleep, hydration, good diet and minimize stress.     During the Seizure   - First, ensure adequate ventilation and place patients on the floor on their left side  Loosen clothing around the neck and ensure the airway is patent. If the patient is clenching the teeth, do not force the mouth open with any object as this can cause severe damage - Remove all items from the surrounding that can be hazardous. The patient may be oblivious to what's happening and may not even know what he or she is doing. If the patient is confused and wandering, either gently guide him/her away and block access to outside areas - Reassure the individual and be comforting - Call 911. In most cases, the seizure ends before EMS arrives. However, there are cases when seizures may last over 3 to 5 minutes. Or the individual may have developed breathing  difficulties or severe injuries. If a pregnant patient or a person with diabetes develops a seizure, it is prudent to call an ambulance. - Finally, if the patient does not regain full consciousness, then call EMS. Most patients will remain confused for about 45 to 90 minutes after a seizure, so you must use judgment in calling for help. - Avoid restraints but make sure the patient is in a bed with padded side rails - Place the individual in a lateral position with the neck slightly flexed; this will help the saliva drain from the mouth and prevent the tongue from falling backward - Remove all nearby furniture and other hazards from the area - Provide verbal assurance as the individual is regaining consciousness - Provide the patient with privacy if possible - Call for help and start treatment as ordered by the caregiver   After the Seizure (Postictal Stage)   After a seizure, most patients experience confusion, fatigue, muscle pain and/or a headache. Thus, one should permit the individual to sleep. For the next few days, reassurance is essential. Being calm and helping reorient the person is also of importance.   Most seizures are painless and end spontaneously. Seizures are not harmful to others but can lead to complications such as stress on the lungs, brain and the heart. Individuals with prior lung problems may develop labored breathing and respiratory distress.

## 2023-06-03 NOTE — ED Notes (Signed)
Dr Wallace Cullens in triage with patient

## 2023-06-03 NOTE — ED Notes (Signed)
Pt transported to CT ?

## 2023-06-08 NOTE — Progress Notes (Unsigned)
NEUROLOGY FOLLOW UP OFFICE NOTE  NEETI SHAWVER [id number]  Assessment/Plan:   Migraine without aura, without status migrainosus, not intractable Concussion vs isolated post-traumatic seizure Near syncope    Further workup for AMS/seizure:  MRI of brain without contrast and routine EEG.  She will finish 14 day course of Depakote. Migraine prevention:  zonisamide 100mg  daily Migraine rescue:  Nurtec). Rizatriptan as last resort. Limit use of pain relievers to no more than 2 days out of week to prevent risk of rebound or medication-overuse headache. Keep headache diary Follow up ***   Subjective:  Takaya Buza is a 68 year old right-handed female with CHF, hypothyroidism, depression, anxiety, conversion disorder, OSA who follows up for migraines.  ED note personally reviewed.  CT and CTA personally reviewed.   UPDATE: Insurance denied [place].  Prescribed Nurtec *** Intensity:  severe Duration:  90 min with rizatriptan, 30 minutes with Nurtec *** Frequency:  2 times a month  On 06/03/2023, she fell after suddenly feeling lightheaded and felt like she was going to pass out.  ***.  This occurred around 3 PM.  ***.  At 7:30 PM she developed slurred speech and seemed confused.  No other focal neurologic deficits.  Symptoms lasted about 30 minutes and resolved.  She had a headache afterwards.  She was evaluated in the ED at [facility].  CT head revealed no acute findings and CTA of head and neck revealed no LVO or hemodynamically significant stenosis.  ECG and labs were negative for arrhythmia or acute coronary syndrome.  Concern for seizure, so she was started on Depakote for 14 days.  Current NSAIDS/analgesics:  ASA 81mg  daily, Excedrin Migraine Current triptans:  none Current ergotamine:  none Current anti-emetic:  Zofran ODT 8mg , promethazine 12.5mg  PO and 25mg  PR Current muscle relaxants:  none Current Antihypertensive medications:  prazosin Current Antidepressant medications:   Cymbalta 60mg  daily, Wellbutrin XL 150mg  Current Anticonvulsant medications:  zonisamide 100mg  QD; gabapentin 600mg  AM and 900mg  PM, divalproex ER 250mg  daily for 14 days. Current anti-CGRP:  Nurtec PRN *** Current Vitamins/Herbal/Supplements:  none Current Antihistamines/Decongestants:  Flonase, Zyrtec Other therapy:  none Hormone/birth control:  none Other medications:  Seroquel, Synthroid, Buspar   Caffeine:  No coffee.  1 Diet Dr. Reino Kent a day Diet:  Drinks water and Powerade.  Sometimes skips meals Exercise:  Walks  But not routine Depression:  yes; Anxiety:  yes Other pain:  Some shoulder pain. Sleep hygiene:  Poor.  Trouble staying asleep.  Has CPAP.    HISTORY:  Migraines since 68 years old.  Over the last 3 months, they have increased in frequency.  No known aggravating factor.  They are are usually severe occipital pounding headaches associated with seeing black spots, nausea, vomiting, photophobia, phonophobia but no speech disturbance, numbness or weakness.  They last 6 to 12 hours. They occur about 3 days a week.  No known triggers.  Laying down in dark quiet room helps.    She has longstanding history of seizure-like episodes with left sided jerking or confusion.  She has been previously worked up by Scientist, product/process development, Dr. Karel Jarvis, and her events have been determined to be psychogenic.  Long-term EEG monitoring captured at least 3 events, which were normal.       Past NSAIDS/analgesics:  Ibuprofen, naproxen, Excedrin Migraine Past abortive triptans:  Sumatriptan 50mg , Maxalt-MLT 10mg  Past abortive ergotamine:  none Past muscle relaxants:  Flexeril, Robaxin, Zanflex Past anti-emetic:  Zofran Past antihypertensive medications:  Propranolol,  Lasix Past antidepressant medications:  Amitriptyline 75mg , citalopram Past anticonvulsant medications:  topiramate Past anti-CGRP:  Ubrelvy (effective, not covered), Past vitamins/Herbal/Supplements:  Melatonin, MVI Past  antihistamines/decongestants:  Meclizine, Allegra Other past therapies:  none   Prior imaging of brain performed, personally reviewed: 02/12/2012:  Normal CT head. 08/09/2016 MRI BRAIN WO:  Negative MRI of brain for age.  No intracranial abnormality. 06/23/2017 CTA HEAD & NECK:  Minor atheromatous change at the carotid bifurcations. No flow-limiting extracranial or intracranial stenosis or dissection.  No acute intracranial findings. No abnormal postcontrast enhancement. 06/24/2017 MRI BRAIN WO:  Stable since 2016 and normal for age noncontrast MRI appearance of the brain.     Family history of headache:  No  PAST MEDICAL HISTORY: Past Medical History:  Diagnosis Date   Acute cystitis without hematuria    Acute encephalopathy 06/25/2016   Altered mental status 06/23/2017   Anxiety    Anxiety and depression 02/04/2012   Arthritis    Cataract 08/31/2017   left eye   CHF (congestive heart failure) (HCC) 2017   resolved   Cholesterol serum increased    Chronic back pain    chronic Rt low back pain. s/p L4-5 fusion. failed Rt facet injections. poss due to Rt SI joint dysfunction.   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   Depression    Diabetes mellitus without complication (HCC)    Diastolic heart failure (HCC) 03/17/2012   Edema 02/04/2012   Encounter for long-term (current) use of medications 02/04/2012   Fall as cause of accidental injury at home as place of occurrence 09/08/2018   Gastroenteritis 01/13/2021   GERD (gastroesophageal reflux disease) 11/13/2015   Hallucination, visual 09/14/2017   Head injury 09/08/2018   Hyperlipidemia    Hyperparathyroidism (HCC)    Hyperparathyroidism, 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   [facility] Irena Cords, MD  Plan:  25 Hydroxy Vit D (59.1 ng/ml)     Hypothyroidism    Moderate episode of recurrent major depressive disorder (HCC) 08/31/2016   PONV (postoperative nausea and vomiting)    Seizures (HCC)    3 years ago,2018   Sepsis (HCC) 04/29/2020   Sleep apnea    cpap   Syncope 08/30/2015   Syncope and collapse 02/12/2012   Tachycardia 02/04/2012   Trochanteric bursitis of both hips 2012   Confirmed on MRI    MEDICATIONS: Current Outpatient Medications on File Prior to Visit  Medication Sig Dispense Refill   albuterol (VENTOLIN HFA) 108 (90 Base) MCG/ACT inhaler TAKE 2 PUFFS BY MOUTH EVERY 6 HOURS AS NEEDED FOR WHEEZE OR SHORTNESS OF BREATH 18 g 6   ALPRAZolam (XANAX) 0.5 MG tablet MAY TAKE ONE TABLET UP TO 2 TIMES A DAY FOR ANXIETY. AVOID TAKING DAILY, IF POSSIBLE. 30 tablet 2   AMBULATORY NON FORMULARY MEDICATION Continuous positive airway pressure (CPAP) machine set at 15 cm of H2O pressure, with all supplemental supplies as needed. 1 each 0   AMBULATORY NON FORMULARY MEDICATION Continuous positive airway pressure (CPAP) machine set at 15 cm of H2O pressure. Supplemental supplies: Headgear with q 6 months, Full face mask q 3 months, Full face cushion q 3 months, 1 standard tubing q 3 months, 1 water chamber q 6 months. 1 reusable filter q 6 months, 6 disposable filters q 3 months. Mask F-29. 1 each 0  aspirin EC 81 MG tablet Take 1 tablet (81 mg total) by mouth daily. 90 tablet 3   atorvastatin (LIPITOR) 20 MG tablet      blood glucose meter kit and supplies KIT Dispense based on patient and insurance preference. Use up to four times daily as directed. Check blood sugar three times weekly, then as needed. 1 each 0   buPROPion (WELLBUTRIN XL) 150 MG 24 hr tablet TAKE 1 TABLET BY MOUTH EVERY DAY 90 tablet 3   busPIRone (BUSPAR) 7.5 MG tablet TAKE 2 TABLETS (15 MG TOTAL) BY MOUTH 3 (THREE) TIMES DAILY. 540 tablet 3   cetirizine (ZYRTEC) 10 MG tablet TAKE 1 TABLET BY MOUTH EVERY DAY 90 tablet 3   diclofenac sodium (VOLTAREN) 1 % GEL  Apply 2 g topically daily as needed (for knee pain). 100 g 3   divalproex (DEPAKOTE ER) 250 MG 24 hr tablet Take 1 tablet (250 mg total) by mouth daily for 14 days. 14 tablet 0   DULoxetine (CYMBALTA) 60 MG capsule TAKE 1 CAPSULE BY MOUTH EVERY DAY 90 capsule 3   famotidine (PEPCID) 20 MG tablet TAKE 1 TABLET BY MOUTH TWICE A DAY 180 tablet 1   fluconazole (DIFLUCAN) 150 MG tablet Take one tablet by mouth at the first sign of symptoms of yeast. If no resolution, repeat dose in 72 hours. 2 tablet 2   fluticasone (FLONASE) 50 MCG/ACT nasal spray PLACE 2 SPRAYS AT BEDTIME INTO BOTH NOSTRILS. 16 g 5   gabapentin (NEURONTIN) 600 MG tablet TAKE 1 TABLET (600 MG TOTAL) BY MOUTH EVERY MORNING AND 1.5 TABLETS (900 MG TOTAL) AT BEDTIME. 225 tablet 3   hydrOXYzine (VISTARIL) 50 MG capsule TAKE 1 CAPSULE (50 MG TOTAL) BY MOUTH EVERY 6 (SIX) HOURS AS NEEDED. 360 capsule 2   levothyroxine (SYNTHROID) 50 MCG tablet TAKE 1 TABLET (50 MCG TOTAL) BY MOUTH EVERY OTHER DAY. BEFORE BREAKFAST. ALTERNATE WITH TABLET 90 tablet 1   levothyroxine (SYNTHROID) 75 MCG tablet TAKE 1 TABLET (75 MCG TOTAL) BY MOUTH EVERY OTHER DAY. BEFORE BREAKFAST. ALTERNATE WITH TABLET 90 tablet 1   meclizine (ANTIVERT) 50 MG tablet Take 0.5 tablets (25 mg total) by mouth 2 (two) times daily as needed for dizziness. (Patient taking differently: Take 50 mg by mouth 2 (two) times daily as needed for dizziness.) 60 tablet 3   metFORMIN (GLUCOPHAGE) 500 MG tablet TAKE 1 TABLET BY MOUTH 2 TIMES DAILY WITH A MEAL. 180 tablet 3   metoCLOPramide (REGLAN) 10 MG tablet TAKE 0.5 TABLET BY MOUTH 3 (THREE) TIMES DAILY AS NEEDED FOR NAUSEA OR VOMITING (NAUSEA/REFLUX). 30 tablet 3   Multiple Vitamins-Minerals (MULTIVITAMIN WITH MINERALS) tablet Take 1 tablet by mouth daily.     ondansetron (ZOFRAN-ODT) 8 MG disintegrating tablet Take 1 tablet (8 mg total) by mouth every 8 (eight) hours as needed for nausea or vomiting. 20 tablet 0   pantoprazole  (PROTONIX) 40 MG tablet TAKE 1 TABLET BY MOUTH TWICE A DAY 180 tablet 1   Polyvinyl Alcohol-Povidone PF (REFRESH) 1.4-0.6 % SOLN Place 1 drop into both eyes daily as needed (dry eyes).     Probiotic Product (ALIGN) 4 MG CAPS Take 1 capsule (4 mg total) by mouth daily. 30 capsule 0   promethazine (PHENERGAN) 12.5 MG tablet Take 1 tablet (12.5 mg total) by mouth every 8 (eight) hours as needed for nausea or vomiting. 20 tablet 0   promethazine (PHENERGAN) 25 MG suppository Place 1 suppository (25 mg total) rectally every 6 (six)  hours as needed for nausea or vomiting. 12 each 3   QUEtiapine (SEROQUEL) 300 MG tablet TAKE 1 TABLET (300 MG TOTAL) BY MOUTH AT BEDTIME. 90 tablet 3   Rimegepant Sulfate (NURTEC) 75 MG TBDP Take 75 mg by mouth daily as needed. 8 tablet 5   rizatriptan (MAXALT-MLT) 10 MG disintegrating tablet TAKE 1 TABLET BY MOUTH AS NEEDED FOR MIGRAINE. MAY REPEAT IN 2 HOURS IF NEEDED 10 tablet 0   trolamine salicylate (ASPERCREME) 10 % cream Apply 1 application topically as needed for muscle pain.     valACYclovir (VALTREX) 500 MG tablet Take 1 tablet (500 mg total) by mouth daily. 90 tablet 3   zonisamide (ZONEGRAN) 100 MG capsule TAKE 1 CAPSULE BY MOUTH EVERY DAY 90 capsule 0   No current facility-administered medications on file prior to visit.    ALLERGIES: Allergies  Allergen Reactions   Tramadol Nausea And Vomiting   Trazodone And Nefazodone Other (See Comments)    Per pt trazodone caused hallucinations and behavior changes     FAMILY HISTORY: Family History  Problem Relation Age of Onset   Diabetes Mother    Heart disease Mother    Kidney disease Mother    Thyroid disease Mother    Heart disease Father    Seizures Father    COPD Father    Irritable bowel syndrome Father    Cancer Maternal Grandmother    Diabetes Sister    Diabetes Brother    Colon cancer Neg Hx    Stomach cancer Neg Hx    Colon polyps Neg Hx    Esophageal cancer Neg Hx    Rectal cancer Neg Hx        Objective:  *** General: No acute distress.  Patient appears well-groomed.   Head:  Normocephalic/atraumatic Eyes:  Fundi examined but not visualized Neck: supple, no paraspinal tenderness, full range of motion Heart:  Regular rate and rhythm Neurological Exam: alert and oriented.  Speech fluent and not dysarthric, language intact.  CN II-XII intact. Bulk and tone normal, muscle strength 5/5 throughout.  Sensation to light touch intact.  Deep tendon reflexes 2+ throughout.  Finger to nose testing intact.  Gait normal, Romberg negative.    Shon Millet, DO  CC: Enid Skeens, NP

## 2023-06-09 ENCOUNTER — Encounter: Payer: Self-pay | Admitting: Neurology

## 2023-06-09 ENCOUNTER — Ambulatory Visit (INDEPENDENT_AMBULATORY_CARE_PROVIDER_SITE_OTHER): Payer: Medicare Other | Admitting: Neurology

## 2023-06-09 VITALS — BP 165/80 | HR 110 | Ht 62.0 in | Wt 170.0 lb

## 2023-06-09 DIAGNOSIS — R569 Unspecified convulsions: Secondary | ICD-10-CM

## 2023-06-09 DIAGNOSIS — G43009 Migraine without aura, not intractable, without status migrainosus: Secondary | ICD-10-CM

## 2023-06-09 NOTE — Patient Instructions (Addendum)
1. Stop divalproex.  Continue zonisamide 100mg  daily  2. Avoid activities in which a seizure would cause danger to yourself or to others.  Don't operate dangerous machinery, swim alone, or climb in high or dangerous places, such as on ladders, roofs, or girders.  Do not drive unless your doctor says you may.  3. If you have any warning that you may have a seizure, lay down in a safe place where you can't hurt yourself.    4.  No driving for 6 months from last seizure, as per [facility].   Please refer to the following link on the Epile[employer] of [employer]'s website for more information: http://www.epilepsyfoundation.org/answerplace/Social/driving/drivingu.cfm   5.  Maintain good sleep hygiene.  6.  Notify your neurology if you are planning pregnancy or if you become pregnant.  7.  Contact your doctor if you have any problems that may be related to the medicine you are taking.  8.  Call 911 and bring the patient back to the ED if:        A.  The seizure lasts longer than 5 minutes.       B.  The patient doesn't awaken shortly after the seizure  C.  The patient has new problems such as difficulty seeing, speaking or moving  D.  The patient was injured during the seizure  E.  The patient has a temperature over 102 F (39C)  F.  The patient vomited and now is having trouble breathing       9.  Check MRI of brain with and without contrast. We have sent a referral to [facility] for your MRI and they will call you directly to schedule your appointment. They are located at [address] [facility]. If you need to contact them directly please call [phone].  10. Check routine EEG.  If unremarkable, will do 72 hour ambulatory EEG 11.  Follow up 3 to 4 months.

## 2023-06-11 ENCOUNTER — Other Ambulatory Visit (HOSPITAL_BASED_OUTPATIENT_CLINIC_OR_DEPARTMENT_OTHER): Payer: Self-pay | Admitting: Nurse Practitioner

## 2023-06-11 DIAGNOSIS — F411 Generalized anxiety disorder: Secondary | ICD-10-CM

## 2023-06-11 DIAGNOSIS — F339 Major depressive disorder, recurrent, unspecified: Secondary | ICD-10-CM

## 2023-06-11 DIAGNOSIS — F331 Major depressive disorder, recurrent, moderate: Secondary | ICD-10-CM

## 2023-06-11 DIAGNOSIS — F39 Unspecified mood [affective] disorder: Secondary | ICD-10-CM

## 2023-06-11 DIAGNOSIS — F515 Nightmare disorder: Secondary | ICD-10-CM

## 2023-06-11 DIAGNOSIS — F4310 Post-traumatic stress disorder, unspecified: Secondary | ICD-10-CM

## 2023-06-12 ENCOUNTER — Other Ambulatory Visit (HOSPITAL_BASED_OUTPATIENT_CLINIC_OR_DEPARTMENT_OTHER): Payer: Self-pay | Admitting: Nurse Practitioner

## 2023-06-12 DIAGNOSIS — R7303 Prediabetes: Secondary | ICD-10-CM

## 2023-06-12 DIAGNOSIS — T7840XD Allergy, unspecified, subsequent encounter: Secondary | ICD-10-CM

## 2023-06-12 DIAGNOSIS — K219 Gastro-esophageal reflux disease without esophagitis: Secondary | ICD-10-CM

## 2023-06-13 ENCOUNTER — Ambulatory Visit (INDEPENDENT_AMBULATORY_CARE_PROVIDER_SITE_OTHER): Payer: Medicare Other | Admitting: Neurology

## 2023-06-13 DIAGNOSIS — R569 Unspecified convulsions: Secondary | ICD-10-CM | POA: Diagnosis not present

## 2023-06-13 NOTE — Progress Notes (Unsigned)
EEG complete - results pending

## 2023-06-13 NOTE — Telephone Encounter (Signed)
Last apt 02/28/23.

## 2023-06-15 NOTE — Procedures (Signed)
ELECTROENCEPHALOGRAM REPORT  Date of Study: 06/13/2023  Patient's Name: Jennifer Davidson MRN: [id number] Date of Birth: 1955/12/11   Clinical History: 68 year old female with history of PNES presents with recurrence of seizure-like activity  Medications: Current Outpatient Medications on File Prior to Visit  Medication Sig Dispense Refill   albuterol (VENTOLIN HFA) 108 (90 Base) MCG/ACT inhaler TAKE 2 PUFFS BY MOUTH EVERY 6 HOURS AS NEEDED FOR WHEEZE OR SHORTNESS OF BREATH 18 g 6   ALPRAZolam (XANAX) 0.5 MG tablet MAY TAKE ONE TABLET UP TO 2 TIMES A DAY FOR ANXIETY. AVOID TAKING DAILY, IF POSSIBLE. 30 tablet 2   AMBULATORY NON FORMULARY MEDICATION Continuous positive airway pressure (CPAP) machine set at 15 cm of H2O pressure, with all supplemental supplies as needed. 1 each 0   AMBULATORY NON FORMULARY MEDICATION Continuous positive airway pressure (CPAP) machine set at 15 cm of H2O pressure. Supplemental supplies: Headgear with q 6 months, Full face mask q 3 months, Full face cushion q 3 months, 1 standard tubing q 3 months, 1 water chamber q 6 months. 1 reusable filter q 6 months, 6 disposable filters q 3 months. Mask F-29. 1 each 0   aspirin EC 81 MG tablet Take 1 tablet (81 mg total) by mouth daily. 90 tablet 3   atorvastatin (LIPITOR) 20 MG tablet      blood glucose meter kit and supplies KIT Dispense based on patient and insurance preference. Use up to four times daily as directed. Check blood sugar three times weekly, then as needed. 1 each 0   buPROPion (WELLBUTRIN XL) 150 MG 24 hr tablet TAKE 1 TABLET BY MOUTH EVERY DAY 90 tablet 3   busPIRone (BUSPAR) 7.5 MG tablet TAKE 2 TABLETS (15 MG TOTAL) BY MOUTH 3 (THREE) TIMES DAILY. 540 tablet 3   cetirizine (ZYRTEC) 10 MG tablet TAKE 1 TABLET BY MOUTH EVERY DAY 90 tablet 1   diclofenac sodium (VOLTAREN) 1 % GEL Apply 2 g topically daily as needed (for knee pain). 100 g 3   DULoxetine (CYMBALTA) 60 MG capsule TAKE 1 CAPSULE BY MOUTH EVERY DAY 90  capsule 3   famotidine (PEPCID) 20 MG tablet TAKE 1 TABLET BY MOUTH TWICE A DAY 180 tablet 1   fluconazole (DIFLUCAN) 150 MG tablet Take one tablet by mouth at the first sign of symptoms of yeast. If no resolution, repeat dose in 72 hours. 2 tablet 2   fluticasone (FLONASE) 50 MCG/ACT nasal spray PLACE 2 SPRAYS AT BEDTIME INTO BOTH NOSTRILS. 16 g 5   gabapentin (NEURONTIN) 600 MG tablet TAKE 1 TABLET (600 MG TOTAL) BY MOUTH EVERY MORNING AND 1.5 TABLETS (900 MG TOTAL) AT BEDTIME. 225 tablet 3   hydrOXYzine (VISTARIL) 50 MG capsule TAKE 1 CAPSULE (50 MG TOTAL) BY MOUTH EVERY 6 (SIX) HOURS AS NEEDED. 360 capsule 2   levothyroxine (SYNTHROID) 50 MCG tablet TAKE 1 TABLET (50 MCG TOTAL) BY MOUTH EVERY OTHER DAY. BEFORE BREAKFAST. ALTERNATE WITH TABLET 90 tablet 1   levothyroxine (SYNTHROID) 75 MCG tablet TAKE 1 TABLET (75 MCG TOTAL) BY MOUTH EVERY OTHER DAY. BEFORE BREAKFAST. ALTERNATE WITH TABLET 90 tablet 1   meclizine (ANTIVERT) 50 MG tablet Take 0.5 tablets (25 mg total) by mouth 2 (two) times daily as needed for dizziness. (Patient taking differently: Take 50 mg by mouth 2 (two) times daily as needed for dizziness.) 60 tablet 3   metFORMIN (GLUCOPHAGE) 500 MG tablet TAKE 1 TABLET BY MOUTH TWICE A DAY WITH FOOD 180  tablet 1   metoCLOPramide (REGLAN) 10 MG tablet TAKE 0.5 TABLET BY MOUTH 3 (THREE) TIMES DAILY AS NEEDED FOR NAUSEA OR VOMITING (NAUSEA/REFLUX). 30 tablet 1   Multiple Vitamins-Minerals (MULTIVITAMIN WITH MINERALS) tablet Take 1 tablet by mouth daily.     ondansetron (ZOFRAN-ODT) 8 MG disintegrating tablet Take 1 tablet (8 mg total) by mouth every 8 (eight) hours as needed for nausea or vomiting. 20 tablet 0   pantoprazole (PROTONIX) 40 MG tablet TAKE 1 TABLET BY MOUTH TWICE A DAY 180 tablet 1   Polyvinyl Alcohol-Povidone PF (REFRESH) 1.4-0.6 % SOLN Place 1 drop into both eyes daily as needed (dry eyes).     Probiotic Product (ALIGN) 4 MG CAPS Take 1 capsule (4 mg total) by mouth  daily. 30 capsule 0   promethazine (PHENERGAN) 12.5 MG tablet Take 1 tablet (12.5 mg total) by mouth every 8 (eight) hours as needed for nausea or vomiting. 20 tablet 0   promethazine (PHENERGAN) 25 MG suppository Place 1 suppository (25 mg total) rectally every 6 (six) hours as needed for nausea or vomiting. 12 each 3   QUEtiapine (SEROQUEL) 300 MG tablet TAKE 1 TABLET BY MOUTH EVERYDAY AT BEDTIME 90 tablet 3   Rimegepant Sulfate (NURTEC) 75 MG TBDP Take 75 mg by mouth daily as needed. (Patient not taking: Reported on 06/09/2023) 8 tablet 5   rizatriptan (MAXALT-MLT) 10 MG disintegrating tablet TAKE 1 TABLET BY MOUTH AS NEEDED FOR MIGRAINE. MAY REPEAT IN 2 HOURS IF NEEDED 10 tablet 0   trolamine salicylate (ASPERCREME) 10 % cream Apply 1 application topically as needed for muscle pain.     valACYclovir (VALTREX) 500 MG tablet Take 1 tablet (500 mg total) by mouth daily. 90 tablet 3   zonisamide (ZONEGRAN) 100 MG capsule TAKE 1 CAPSULE BY MOUTH EVERY DAY 90 capsule 0   No current facility-administered medications on file prior to visit.    Technical Summary: A multichannel digital EEG recording measured by the international 10-20 system with electrodes applied with paste and impedances below 5000 ohms performed in our laboratory with EKG monitoring in an awake and drowsy patient.  Photic stimulation performed.  The digital EEG was referentially recorded, reformatted, and digitally filtered in a variety of bipolar and referential montages for optimal display.    Description: The patient is awake and drowsy during the recording.  During maximal wakefulness, there is a symmetric, medium voltage 8 Hz posterior dominant rhythm that attenuates with eye opening.  The record is symmetric.  Stage 2 sleep is not seen.  Photic stimulation did not elicit any abnormalities.  There were no epileptiform discharges or electrographic seizures seen.    EKG lead was unremarkable.  Impression: This awake and drowsy  EEG is normal.    Clinical Correlation: A normal EEG does not exclude a clinical diagnosis of epilepsy.  If further clinical questions remain, prolonged EEG may be helpful.  Clinical correlation is advised.   Shon Millet, DO

## 2023-06-16 NOTE — Progress Notes (Signed)
LMOVM for patient to call the office back.

## 2023-06-20 ENCOUNTER — Ambulatory Visit (INDEPENDENT_AMBULATORY_CARE_PROVIDER_SITE_OTHER): Payer: Medicare Other | Admitting: Nurse Practitioner

## 2023-06-20 ENCOUNTER — Encounter: Payer: Self-pay | Admitting: Nurse Practitioner

## 2023-06-20 VITALS — BP 122/74 | HR 86 | Wt 177.6 lb

## 2023-06-20 DIAGNOSIS — R296 Repeated falls: Secondary | ICD-10-CM

## 2023-06-20 DIAGNOSIS — F4310 Post-traumatic stress disorder, unspecified: Secondary | ICD-10-CM | POA: Diagnosis not present

## 2023-06-20 DIAGNOSIS — R55 Syncope and collapse: Secondary | ICD-10-CM

## 2023-06-20 DIAGNOSIS — N182 Chronic kidney disease, stage 2 (mild): Secondary | ICD-10-CM

## 2023-06-20 DIAGNOSIS — E559 Vitamin D deficiency, unspecified: Secondary | ICD-10-CM

## 2023-06-20 DIAGNOSIS — E038 Other specified hypothyroidism: Secondary | ICD-10-CM

## 2023-06-20 DIAGNOSIS — F411 Generalized anxiety disorder: Secondary | ICD-10-CM

## 2023-06-20 DIAGNOSIS — F5101 Primary insomnia: Secondary | ICD-10-CM

## 2023-06-20 DIAGNOSIS — R4781 Slurred speech: Secondary | ICD-10-CM | POA: Insufficient documentation

## 2023-06-20 DIAGNOSIS — E782 Mixed hyperlipidemia: Secondary | ICD-10-CM

## 2023-06-20 DIAGNOSIS — R7303 Prediabetes: Secondary | ICD-10-CM

## 2023-06-20 DIAGNOSIS — F339 Major depressive disorder, recurrent, unspecified: Secondary | ICD-10-CM

## 2023-06-20 DIAGNOSIS — R42 Dizziness and giddiness: Secondary | ICD-10-CM

## 2023-06-20 HISTORY — DX: Slurred speech: R47.81

## 2023-06-20 HISTORY — DX: Repeated falls: R29.6

## 2023-06-20 MED ORDER — HYDROXYZINE PAMOATE 50 MG PO CAPS: ORAL_CAPSULE | ORAL | 2 refills | Status: DC

## 2023-06-20 NOTE — Assessment & Plan Note (Signed)
>>  ASSESSMENT AND PLAN FOR CHRONIC KIDNEY DISEASE WRITTEN ON 06/20/2023  7:06 PM BY Nishant Schrecengost E, NP  Monitor labs today. No changes in management at this time.

## 2023-06-20 NOTE — Progress Notes (Signed)
Shawna Clamp, DNP, AGNP-c [facility]  [address] [place], [place] [address] [phone]  ESTABLISHED PATIENT- Chronic Health and/or Follow-Up Visit  Blood pressure 122/74, pulse 86, weight 177 lb 9.6 oz (80.6 kg).    Jennifer Davidson is a 68 y.o. year old female presenting today for evaluation and management of chronic conditions.   History of Present Illness The patient presents with episodes of slurred speech and falls. She is accompanied by [name], her husband.  She experiences episodes of slurred speech, particularly after eating, which have been severe enough to require hospital visits. These episodes have occurred multiple times, including on a day when she fell and was taken to the hospital. Initially, she was placed on seizure medication, which was later discontinued after tests indicated no seizure activity. These episodes have not occurred in the past week.  She has experienced multiple falls, the most recent being a fall over a dog in the hallway, resulting in her head hitting the floor. Dizziness upon standing or turning quickly is reported, sometimes resolving after a moment. She also mentions weakness in her legs and a tendency to shuffle when walking. [name] advises her to 'pick your feet up' to prevent falls. There is a history of falls, often landing on her right side, leading to concerns about her hip and shoulder. She has been seeing Dr. Luiz Blare for hip issues and mentions a weak right hand affecting activities like crafts.  She is currently taking several medications, including hydroxyzine four times a day, rather than as needed for anxiety, and Buspar three times a day. She also has prescriptions for Xanax and gabapentin, which she uses sparingly. Hydroxyzine has sometimes led to increased sleepiness and slurred speech based on evaluation today. It appears that there have been times when doses are missed that she will take the AM and Midmorning meds  together.  She lives in an older house with narrow hallways and has a small dog that often follows her, contributing to the risk of falls. She uses a cane for support and has a wheelchair for longer distances, such as attending events. Small rugs have been removed from her home to reduce tripping hazards.  All ROS negative with exception of what is listed above.   PHYSICAL EXAM Physical Exam Vitals and nursing note reviewed.  Constitutional:      General: She is not in acute distress.    Appearance: Normal appearance. She is not ill-appearing.  HENT:     Head: Normocephalic.  Eyes:     Conjunctiva/sclera: Conjunctivae normal.     Pupils: Pupils are equal, round, and reactive to light.  Neck:     Vascular: No carotid bruit.  Cardiovascular:     Rate and Rhythm: Normal rate and regular rhythm.     Pulses: Normal pulses.     Heart sounds: Normal heart sounds. No murmur heard. Pulmonary:     Effort: Pulmonary effort is normal.     Breath sounds: Normal breath sounds.  Abdominal:     General: Bowel sounds are normal. There is no distension.     Palpations: Abdomen is soft.     Tenderness: There is no abdominal tenderness. There is no guarding.  Musculoskeletal:     Cervical back: Neck supple.     Right lower leg: No edema.     Left lower leg: No edema.  Skin:    General: Skin is warm and dry.     Capillary Refill: Capillary refill takes less than 2  seconds.  Neurological:     General: No focal deficit present.     Mental Status: She is alert and oriented to person, place, and time.     Motor: Weakness present.     Gait: Gait abnormal.  Psychiatric:        Mood and Affect: Mood normal.        Behavior: Behavior normal.      PLAN Problem List Items Addressed This Visit     Chronic kidney disease (Chronic)   Monitor labs today. No changes in management at this time.       Relevant Orders   CBC with Differential/Platelet   Comprehensive metabolic panel   Iron, TIBC  and Ferritin Panel   Hemoglobin A1c   Hypothyroid (Chronic)   Relevant Orders   CBC with Differential/Platelet   Comprehensive metabolic panel   Iron, TIBC and Ferritin Panel   Hemoglobin A1c   Major depression, recurrent, chronic (HCC) (Chronic)   Relevant Medications   hydrOXYzine (VISTARIL) 50 MG capsule   Generalized anxiety disorder   Taking multiple medications for anxiety, including hydroxyzine, alprazolam, and buspirone. Noted that these medications can contribute to dizziness and increased fall risk. Discussed the risks of taking multiple doses of medication at once and the importance of proper medication management. We also discussed as needed medication vs scheduled. It appears she has accidentally been taking her PRN hydroxyzine as a scheduled medication. We will make changes to that today. No concerns with the use of her alprazolam as this is very limited.  - Continue buspirone three times a day - Use hydroxyzine only for acute anxiety, followed by alprazolam if needed. May take hydroxyzine at bedtime for sleep. - Educate on the risks of taking multiple doses of medication at once      Relevant Medications   hydrOXYzine (VISTARIL) 50 MG capsule   Dizziness   Possible concussion from recent fall, evidenced by increased sleepiness, slurred speech, and dizziness. Explained that concussions can occur even with minor falls and can cause symptoms like those described. Emphasized monitoring for persistent or worsening symptoms and the need for rest to avoid further injury. - Monitor for persistent or worsening symptoms - Educate on the importance of rest and avoiding activities that could lead to another fall      Frequent falls - Primary   Experienced multiple falls, including a recent fall over a dog resulting in head impact. Falls associated with dizziness and weakness, particularly when standing up quickly or turning around. Reports shuffling gait and difficulty lifting feet,  contributing to falls. Suspected orthostatic hypotension as a contributing factor. Discussed physical therapy for gait strengthening and the use of compression stockings to manage symptoms. Advised on home safety to prevent further falls. - Refer to physical therapy for gait strengthening exercises - Advise use of compression stockings during the day - Instruct to pause when standing up or sitting up to allow blood pressure to adjust - Ensure home environment is free of tripping hazards      Relevant Orders   Ambulatory referral to Physical Therapy   CBC with Differential/Platelet   Comprehensive metabolic panel   Iron, TIBC and Ferritin Panel   Hemoglobin A1c   Slurred speech   Slurred Speech Episodes of slurred speech, particularly in the late afternoon and evening. Suspected relation to timing and dosage of hydroxyzine, which was being taken four times a day. Discussed adjusting the medication regimen to reduce these symptoms. - Adjust hydroxyzine to be taken  only at bedtime or as needed for acute anxiety - Monitor for improvement in slurred speech with the adjusted medication regimen       Prediabetes   Relevant Orders   CBC with Differential/Platelet   Comprehensive metabolic panel   Iron, TIBC and Ferritin Panel   Hemoglobin A1c   Vitamin D deficiency   Relevant Orders   CBC with Differential/Platelet   Comprehensive metabolic panel   Iron, TIBC and Ferritin Panel   Hemoglobin A1c   Mixed hyperlipidemia   Relevant Orders   CBC with Differential/Platelet   Comprehensive metabolic panel   Iron, TIBC and Ferritin Panel   Hemoglobin A1c   Primary insomnia   Relevant Orders   CBC with Differential/Platelet   Comprehensive metabolic panel   Iron, TIBC and Ferritin Panel   Hemoglobin A1c   PTSD (post-traumatic stress disorder)   Relevant Medications   hydrOXYzine (VISTARIL) 50 MG capsule   Other Visit Diagnoses       Vasovagal response       Relevant Orders   CBC  with Differential/Platelet   Comprehensive metabolic panel   Iron, TIBC and Ferritin Panel   Hemoglobin A1c     General Health Maintenance Advised on general health maintenance to improve overall well-being and reduce fall risk. Encouraged regular exercise and activities that promote leg strength and mobility. Emphasized the importance of a safe home environment to prevent falls. - Encourage regular use of a stationary bicycle or seated bicycle for exercise - Recommend engaging in activities that promote leg strength and mobility - Advise on the importance of a safe home environment to prevent falls  Follow-up - Schedule an MRI of the head - Follow up with Dr. Everlena Cooper for EEG results - Attend physical therapy appointment on February 24th at 2:30 PM - Monitor for any changes in symptoms and report back.  Return in about 3 months (around 09/17/2023) for Med Management 30.  SaraBeth Syla Devoss, DNP, AGNP-c Time: 54 minutes, >50% spent counseling, care coordination, chart review, and documentation.

## 2023-06-20 NOTE — Patient Instructions (Addendum)
I have sent in the referral for the gait training physical therapy to help with walking and fall prevention.  When you stand or move from a laying position to sitting or standing pause before you take off to allow your blood pressure to regulate. This will help reduce the dizziness and falls.  I would also like you to try the compression stockings during the day to help with keeping the blood up into the body and out of the lower legs.   I want you to watch closely for the slurring of speech or any weakness. This is always a reason for emergency evaluation.   I want you to hold the medication called Hydroxyzine during the day. Go ahead and continue to take this at bedtime for sleep.  This is the green and white capsule.  Take this only if you are having anxiety symptoms. Start with the hydroxyzine, if the symptoms are not better in 30 minutes you can take the xanax.

## 2023-06-20 NOTE — Assessment & Plan Note (Signed)
>>  ASSESSMENT AND PLAN FOR GENERALIZED ANXIETY DISORDER WRITTEN ON 06/20/2023  7:08 PM BY Linell Shawn E, NP  Taking multiple medications for anxiety, including hydroxyzine , alprazolam , and buspirone . Noted that these medications can contribute to dizziness and increased fall risk. Discussed the risks of taking multiple doses of medication at once and the importance of proper medication management. We also discussed as needed medication vs scheduled. It appears she has accidentally been taking her PRN hydroxyzine  as a scheduled medication. We will make changes to that today. No concerns with the use of her alprazolam  as this is very limited.  - Continue buspirone  three times a day - Use hydroxyzine  only for acute anxiety, followed by alprazolam  if needed. May take hydroxyzine  at bedtime for sleep. - Educate on the risks of taking multiple doses of medication at once

## 2023-06-20 NOTE — Assessment & Plan Note (Signed)
Slurred Speech Episodes of slurred speech, particularly in the late afternoon and evening. Suspected relation to timing and dosage of hydroxyzine, which was being taken four times a day. Discussed adjusting the medication regimen to reduce these symptoms. - Adjust hydroxyzine to be taken only at bedtime or as needed for acute anxiety - Monitor for improvement in slurred speech with the adjusted medication regimen

## 2023-06-20 NOTE — Assessment & Plan Note (Signed)
>>  ASSESSMENT AND PLAN FOR DIZZINESS WRITTEN ON 06/20/2023  7:09 PM BY Rhianon Zabawa E, NP  Possible concussion from recent fall, evidenced by increased sleepiness, slurred speech, and dizziness. Explained that concussions can occur even with minor falls and can cause symptoms like those described. Emphasized monitoring for persistent or worsening symptoms and the need for rest to avoid further injury. - Monitor for persistent or worsening symptoms - Educate on the importance of rest and avoiding activities that could lead to another fall

## 2023-06-20 NOTE — Assessment & Plan Note (Signed)
Taking multiple medications for anxiety, including hydroxyzine, alprazolam, and buspirone. Noted that these medications can contribute to dizziness and increased fall risk. Discussed the risks of taking multiple doses of medication at once and the importance of proper medication management. We also discussed as needed medication vs scheduled. It appears she has accidentally been taking her PRN hydroxyzine as a scheduled medication. We will make changes to that today. No concerns with the use of her alprazolam as this is very limited.  - Continue buspirone three times a day - Use hydroxyzine only for acute anxiety, followed by alprazolam if needed. May take hydroxyzine at bedtime for sleep. - Educate on the risks of taking multiple doses of medication at once

## 2023-06-20 NOTE — Assessment & Plan Note (Signed)
Monitor labs today. No changes in management at this time.

## 2023-06-20 NOTE — Assessment & Plan Note (Signed)
Possible concussion from recent fall, evidenced by increased sleepiness, slurred speech, and dizziness. Explained that concussions can occur even with minor falls and can cause symptoms like those described. Emphasized monitoring for persistent or worsening symptoms and the need for rest to avoid further injury. - Monitor for persistent or worsening symptoms - Educate on the importance of rest and avoiding activities that could lead to another fall

## 2023-06-20 NOTE — Assessment & Plan Note (Signed)
Experienced multiple falls, including a recent fall over a dog resulting in head impact. Falls associated with dizziness and weakness, particularly when standing up quickly or turning around. Reports shuffling gait and difficulty lifting feet, contributing to falls. Suspected orthostatic hypotension as a contributing factor. Discussed physical therapy for gait strengthening and the use of compression stockings to manage symptoms. Advised on home safety to prevent further falls. - Refer to physical therapy for gait strengthening exercises - Advise use of compression stockings during the day - Instruct to pause when standing up or sitting up to allow blood pressure to adjust - Ensure home environment is free of tripping hazards

## 2023-06-21 LAB — CBC WITH DIFFERENTIAL/PLATELET
Basophils Absolute: 0.1 10*3/uL (ref 0.0–0.2)
Basos: 1 %
EOS (ABSOLUTE): 0.3 10*3/uL (ref 0.0–0.4)
Eos: 3 %
Hematocrit: 34.2 % (ref 34.0–46.6)
Hemoglobin: 10.8 g/dL — ABNORMAL LOW (ref 11.1–15.9)
Immature Grans (Abs): 0.2 10*3/uL — ABNORMAL HIGH (ref 0.0–0.1)
Immature Granulocytes: 2 %
Lymphocytes Absolute: 1.9 10*3/uL (ref 0.7–3.1)
Lymphs: 25 %
MCH: 29 pg (ref 26.6–33.0)
MCHC: 31.6 g/dL (ref 31.5–35.7)
MCV: 92 fL (ref 79–97)
Monocytes Absolute: 0.7 10*3/uL (ref 0.1–0.9)
Monocytes: 9 %
Neutrophils Absolute: 4.5 10*3/uL (ref 1.4–7.0)
Neutrophils: 60 %
Platelets: 355 10*3/uL (ref 150–450)
RBC: 3.73 x10E6/uL — ABNORMAL LOW (ref 3.77–5.28)
RDW: 17.1 % — ABNORMAL HIGH (ref 11.7–15.4)
WBC: 7.6 10*3/uL (ref 3.4–10.8)

## 2023-06-21 LAB — COMPREHENSIVE METABOLIC PANEL
ALT: 15 IU/L (ref 0–32)
AST: 18 IU/L (ref 0–40)
Albumin: 3.8 g/dL — ABNORMAL LOW (ref 3.9–4.9)
Alkaline Phosphatase: 70 IU/L (ref 44–121)
BUN/Creatinine Ratio: 12 (ref 12–28)
BUN: 15 mg/dL (ref 8–27)
CO2: 19 mmol/L — ABNORMAL LOW (ref 20–29)
Calcium: 9.1 mg/dL (ref 8.7–10.3)
Chloride: 105 mmol/L (ref 96–106)
Creatinine, Ser: 1.3 mg/dL — ABNORMAL HIGH (ref 0.57–1.00)
Globulin, Total: 1.8 g/dL (ref 1.5–4.5)
Glucose: 94 mg/dL (ref 70–99)
Potassium: 4.9 mmol/L (ref 3.5–5.2)
Sodium: 139 mmol/L (ref 134–144)
Total Protein: 5.6 g/dL — ABNORMAL LOW (ref 6.0–8.5)
eGFR: 45 mL/min/{1.73_m2} — ABNORMAL LOW (ref >59)

## 2023-06-21 LAB — IRON,TIBC AND FERRITIN PANEL
Ferritin: 57 ng/mL (ref 15–150)
Iron Saturation: 18 % (ref 15–55)
Iron: 42 ug/dL (ref 27–139)
Total Iron Binding Capacity: 229 ug/dL — ABNORMAL LOW (ref 250–450)
UIBC: 187 ug/dL (ref 118–369)

## 2023-06-21 LAB — HEMOGLOBIN A1C
Est. average glucose Bld gHb Est-mCnc: 105 mg/dL
Hgb A1c MFr Bld: 5.3 % (ref 4.8–5.6)

## 2023-06-22 ENCOUNTER — Telehealth: Payer: Self-pay | Admitting: *Deleted

## 2023-06-22 ENCOUNTER — Ambulatory Visit
Admission: RE | Admit: 2023-06-22 | Discharge: 2023-06-22 | Disposition: A | Discharge status: Home / Self Care | Payer: Medicare Other | Source: Ambulatory Visit | Attending: Neurology

## 2023-06-22 DIAGNOSIS — G43009 Migraine without aura, not intractable, without status migrainosus: Secondary | ICD-10-CM

## 2023-06-22 DIAGNOSIS — R569 Unspecified convulsions: Secondary | ICD-10-CM

## 2023-06-22 MED ORDER — GADOPICLENOL 0.5 MMOL/ML IV SOLN: 8.0000 mL | Freq: Once | INTRAVENOUS | Status: DC | PRN

## 2023-06-22 MED ORDER — GADOPICLENOL 0.5 MMOL/ML IV SOLN
8.0000 mL | Freq: Once | INTRAVENOUS | Status: AC | PRN
  Administered 2023-06-22: 8 mL via INTRAVENOUS

## 2023-06-24 ENCOUNTER — Other Ambulatory Visit: Payer: Self-pay

## 2023-06-24 ENCOUNTER — Other Ambulatory Visit (HOSPITAL_BASED_OUTPATIENT_CLINIC_OR_DEPARTMENT_OTHER): Payer: Self-pay | Admitting: Nurse Practitioner

## 2023-06-24 DIAGNOSIS — K219 Gastro-esophageal reflux disease without esophagitis: Secondary | ICD-10-CM

## 2023-06-24 MED ORDER — PANTOPRAZOLE SODIUM 40 MG PO TBEC: 40.0000 mg | DELAYED_RELEASE_TABLET | Freq: Two times a day (BID) | ORAL | 1 refills | Status: DC

## 2023-06-27 ENCOUNTER — Telehealth: Payer: Self-pay | Admitting: *Deleted

## 2023-06-28 ENCOUNTER — Other Ambulatory Visit: Payer: Self-pay

## 2023-06-28 ENCOUNTER — Encounter: Payer: Self-pay | Admitting: Nurse Practitioner

## 2023-06-28 ENCOUNTER — Other Ambulatory Visit: Payer: Self-pay | Admitting: Neurology

## 2023-06-28 DIAGNOSIS — D72828 Other elevated white blood cell count: Secondary | ICD-10-CM

## 2023-06-28 DIAGNOSIS — E875 Hyperkalemia: Secondary | ICD-10-CM

## 2023-06-28 DIAGNOSIS — G43709 Chronic migraine without aura, not intractable, without status migrainosus: Secondary | ICD-10-CM

## 2023-07-09 NOTE — Therapy (Signed)
 OUTPATIENT PHYSICAL THERAPY NEURO EVALUATION   Patient Name: Jennifer Davidson MRN: [id number] DOB:30-Apr-1956, 68 y.o., female Today's Date: 07/11/2023   PCP: Tollie Eth, NP REFERRING PROVIDER: Tollie Eth, NP END OF SESSION:  PT End of Session - 07/11/23 1428     Visit Number 1    Number of Visits 8    Date for PT Re-Evaluation 08/08/23    Authorization Type Medicare    Progress Note Due on Visit 10    PT Start Time 1428    PT Stop Time 1510    PT Time Calculation (min) 42 min    Activity Tolerance Patient tolerated treatment well    Behavior During Therapy [facility] for tasks assessed/performed             Past Medical History:  Diagnosis Date   Acute cystitis without hematuria    Acute encephalopathy 06/25/2016   Altered mental status 06/23/2017   Anxiety    Anxiety and depression 02/04/2012   Arthritis    Cataract 08/31/2017   left eye   CHF (congestive heart failure) (HCC) 2017   resolved   Cholesterol serum increased    Chronic back pain    chronic Rt low back pain. s/p L4-5 fusion. failed Rt facet injections. poss due to Rt SI joint dysfunction.   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   Depression    Diabetes mellitus without complication (HCC)    Diastolic heart failure (HCC) 03/17/2012   Edema 02/04/2012   Encounter for long-term (current) use of medications 02/04/2012   Fall as cause of accidental injury at home as place of occurrence 09/08/2018   Gastroenteritis 01/13/2021   GERD (gastroesophageal reflux disease) 11/13/2015   Hallucination, visual 09/14/2017   Head injury 09/08/2018   Hyperlipidemia    Hyperparathyroidism (HCC)    Hyperparathyroidism, 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   [facility] Irena Cords, MD  Plan:  25 Hydroxy Vit D (59.1 ng/ml)     Hypothyroidism    Moderate episode of recurrent major depressive disorder (HCC) 08/31/2016   PONV (postoperative nausea and vomiting)    Seizures (HCC)    3 years ago,2018   Sepsis (HCC) 04/29/2020   Sleep apnea    cpap   Syncope 08/30/2015   Syncope and collapse 02/12/2012   Tachycardia 02/04/2012   Trochanteric bursitis of both hips 2012   Confirmed on MRI   Past Surgical History:  Procedure Laterality Date   ABDOMINAL HYSTERECTOMY     APPENDECTOMY  1986   BACK SURGERY     L4-5 fusion   CATARACT EXTRACTION     KNEE SURGERY     REVERSE SHOULDER ARTHROPLASTY Left 07/03/2020   Procedure: REVERSE SHOULDER ARTHROPLASTY;  Surgeon: Jones Broom, MD;  Location: [facility];  Service: Orthopedics;  Laterality: Left;   REVERSE SHOULDER ARTHROPLASTY Right 03/05/2021   Procedure: REVERSE SHOULDER ARTHROPLASTY;  Surgeon: Jones Broom, MD;  Location: [facility];  Service: Orthopedics;  Laterality: Right;   SHOULDER ARTHROSCOPY WITH SUBACROMIAL DECOMPRESSION Left 10/20/2018   Procedure: SHOULDER ARTHROSCOPY, ROTATOR CUFF DEBRIDEMENT, GLENOHUMERAL JOINT DEBRIDEMENT, ACROMIOPLASTY, DISTAL CLAVICLE RESECTION;  Surgeon: Jodi Geralds, MD;  Location: [facility];  Service: Orthopedics;  Laterality: Left;   Patient Active Problem List   Diagnosis Date Noted   Frequent falls 06/20/2023   Slurred speech 06/20/2023  Wheezing 02/22/2023   Benign paroxysmal positional vertigo 02/22/2023   Vitamin D deficiency 02/15/2023   Dizziness 06/24/2022   Recurrent oral ulcers 09/29/2021   Primary insomnia 08/27/2021   OSA (obstructive sleep apnea) 07/08/2021   Nausea and vomiting 07/08/2021   Rotator cuff tear arthropathy of right shoulder 02/20/2021   Allergies 05/06/2020   Rotator cuff tear arthropathy of left shoulder 10/20/2018   Arthritis of left acromioclavicular joint 10/20/2018   Prediabetes 09/14/2017   Dream anxiety disorder 09/14/2017   PTSD (post-traumatic stress disorder) 08/31/2016    Gastroesophageal reflux disease 11/13/2015   Generalized anxiety disorder 06/14/2015   Neuropathy of lower extremity 05/14/2015   Headache, migraine 01/17/2015   Major depression, recurrent, chronic (HCC) 12/02/2014   Mood disorder (HCC) 12/01/2014   Chronic back pain    Chronic kidney disease 02/04/2012   Hypothyroid 02/04/2012   Mixed hyperlipidemia 02/04/2012    ONSET DATE: 2.5 months  REFERRING DIAG: R29.6 (ICD-10-CM) - Frequent falls  THERAPY DIAG:  Frequent falls - Plan: PT plan of care cert/re-cert  Difficulty in walking, not elsewhere classified - Plan: PT plan of care cert/re-cert  Balance disorder - Plan: PT plan of care cert/re-cert  Weakness of both lower extremities - Plan: PT plan of care cert/re-cert  Rationale for Evaluation and Treatment: Rehabilitation  SUBJECTIVE:                                                                                                                                                                                             SUBJECTIVE STATEMENT: 4 really bad falls in the past 6 months; 3 she got off balance; 1 she tripped and fell and broke right shoulder in early December so just came out of the sling; break was just behind the shoulder replacement.  Does have neuropathy in her feet.  "Legs are weak" get tired quickly.  Started taking iron pills about a week ago for low hemoglobin; does have some "lightheadedness" if she gets up too quickly or moves to fast.  Uses QC at all times Pt accompanied by: self and significant other  PERTINENT HISTORY: TSA right 07/2020; 02/2022; fused L4-5 2011; L2-L3 "has trouble"; had an injection in right hip last week for bursitis; has had 3 shots  PAIN:  Are you having pain? Yes: NPRS scale: 8/10 Pain location: right greater troch Pain description: constant and sore, achy Aggravating factors: unknown Relieving factors: ice pack  PRECAUTIONS: Fall   FALLS: Has patient fallen in last 6 months? Yes.  Number of falls 4  PLOF: Independent uses a QC at baseline  PATIENT GOALS: to be able to get  my legs stronger so I can get outside in the yard  OBJECTIVE:  Note: Objective measures were completed at Evaluation unless otherwise noted.  DIAGNOSTIC FINDINGS:   COGNITION: Overall cognitive status: Within functional limits for tasks assessed   SENSATION: Neuropathy bilateral feet and ankles  COORDINATION: Wfl heel to shin  EDEMA:  Swelling in ankles bilaterally  unknown reason    LOWER EXTREMITY ROM:     MMT Right Eval Left Eval  Hip flexion 3+ 4-  Hip extension 3- 3-  Hip abduction 3- 3-  Hip adduction    Hip internal rotation    Hip external rotation    Knee flexion 3- 3-  Knee extension 3+ 4+  Ankle dorsiflexion 3+ 4  Ankle plantarflexion    Ankle inversion    Ankle eversion     (Blank rows = not tested)  LOWER EXTREMITY AROM   AROM Right Eval Left Eval  Hip flexion    Hip extension    Hip abduction    Hip adduction    Hip internal rotation    Hip external rotation    Knee flexion    Knee extension    Ankle dorsiflexion    Ankle plantarflexion    Ankle inversion    Ankle eversion    (Blank rows = not tested)  BED MOBILITY:  Sit to supine Modified independence Supine to sit Min A Rolling to Right Modified independence Rolling to Left Modified independence  TRANSFERS: Assistive device utilized: None  Sit to stand: Modified independence Stand to sit: Modified independence Chair to chair: SBA Floor:  not tested   STAIRS: Next visit  GAIT: Gait pattern: decreased arm swing- Right, decreased arm swing- Left, and decreased stride length Distance walked: 60 ft in PT gym Assistive device utilized: Quad cane small base Level of assistance: Modified independence Comments: decreased gait speed; guarding right arm  FUNCTIONAL TESTS:  5 times sit to stand: 22.61 no UE assist; fatigued afterwards Timed up and go (TUG): 26.48 sec with QC Dynamic  Gait Index: next visit SLS  right 2" and left 5"  PATIENT SURVEYS:  ABC scale next visit                                                                                                                              TREATMENT DATE: 07/11/23 physical therapy evaluation and HEP instruction    PATIENT EDUCATION: Education details: Patient educated on exam findings, POC, scope of PT, HEP, and what to expect next visit. Person educated: Patient Education method: Explanation, Demonstration, and Handouts Education comprehension: verbalized understanding, returned demonstration, verbal cues required, and tactile cues required   HOME EXERCISE PROGRAM: Access Code: [email or web address] URL: [email or web address] Date: 07/11/2023 Prepared by: [facility] - Rehab  Exercises - Sit to Stand  - 2 x daily - 7 x weekly - 1 sets - 5 reps - Seated Heel Toe Raises  - 2 x daily - 7 x weekly - 1  sets - 10 reps - Supine Bridge  - 1 x daily - 7 x weekly - 3 sets - 10 reps - standing single leg balance at the counter (try to not hold on)  - 1 x daily - 7 x weekly - 1 sets - 5 reps - 10 sec hold  GOALS: Goals reviewed with patient? No  SHORT TERM GOALS: Target date: 07/25/2023  patient will be independent with initial HEP  Baseline: Goal status: INITIAL  2.  Patient will report 30% improvement overall   Baseline:  Goal status: INITIAL   LONG TERM GOALS: Target date: 08/08/2023  Patient will be independent in self management strategies to improve quality of life and functional outcomes.  Baseline:  Goal status: INITIAL  2.  Patient will report 50% improvement overall   Baseline:  Goal status: INITIAL  3.  Patient will be able to stand SLS x 10" each leg to demonstrate improved functional balance.   Baseline: Right 2" and left 5" Goal status: INITIAL  4.   Patient will increase right leg MMT's to 4+ to 5/5 to allow navigation of steps without gait deviation or loss of balance  Baseline:  see above Goal status: INITIAL  5.  Patient will improve 5 times sit to stand score to 15 sec or less to demonstrate improved functional mobility and increased leg strength.    Baseline: 22.61 Goal status: INITIAL  6.  Patient will improve TUG score to 20 sec or less to demonstrate decreased fall risk and improved functional mobility Baseline: 26.48 sec Goal status: INITIAL  ASSESSMENT:  CLINICAL IMPRESSION: Patient is a 68 y.o. female who was seen today for physical therapy evaluation and treatment for R29.6 (ICD-10-CM) - Frequent falls. Patient demonstrates decreased strength, balance deficits and gait abnormalities which are negatively impacting patient ability to perform ADLs and functional mobility tasks. Patient will benefit from skilled physical therapy services to address these deficits to improve level of function with ADLs, functional mobility tasks, and reduce risk for falls.    OBJECTIVE IMPAIRMENTS: Abnormal gait, decreased activity tolerance, decreased balance, decreased endurance, decreased mobility, difficulty walking, decreased ROM, decreased strength, impaired perceived functional ability, and pain.   ACTIVITY LIMITATIONS: carrying, lifting, standing, squatting, stairs, transfers, and locomotion level  PARTICIPATION LIMITATIONS: meal prep, cleaning, laundry, shopping, community activity, and yard work    [facility] POTENTIAL: Good  CLINICAL DECISION MAKING: Evolving/moderate complexity  EVALUATION COMPLEXITY: Moderate  PLAN:  PT FREQUENCY: 2x/week  PT DURATION: 4 weeks  PLANNED INTERVENTIONS: 97164- PT Re-evaluation, 97110-Therapeutic exercises, 97530- Therapeutic activity, [id number]- Neuromuscular re-education, 97535- Self Care, [id number]- Manual therapy, [id number]- Gait training, [phone]- Orthotic Fit/training, [phone]- Canalith repositioning, [id number]- Aquatic Therapy, [phone]- Splinting, Patient/Family education, Balance training, Stair training, Taping, Dry Needling, Joint  mobilization, Joint manipulation, Spinal manipulation, Spinal mobilization, Scar mobilization, and DME instructions.   PLAN FOR NEXT SESSION: Review HEP and goals; DGI and ABC scale; lower extremity strengthening, balance   3:14 PM, 07/11/23 Everline Mahaffy Small Lenzi Marmo MPT [facility] [place] [phone] Ph:[phone]

## 2023-07-11 ENCOUNTER — Telehealth: Payer: Self-pay | Admitting: *Deleted

## 2023-07-11 ENCOUNTER — Other Ambulatory Visit: Payer: Self-pay

## 2023-07-11 ENCOUNTER — Other Ambulatory Visit (HOSPITAL_BASED_OUTPATIENT_CLINIC_OR_DEPARTMENT_OTHER): Payer: Self-pay | Admitting: Nurse Practitioner

## 2023-07-11 ENCOUNTER — Ambulatory Visit (HOSPITAL_COMMUNITY): Payer: Medicare Other | Attending: Nurse Practitioner

## 2023-07-11 DIAGNOSIS — R296 Repeated falls: Secondary | ICD-10-CM | POA: Diagnosis present

## 2023-07-11 DIAGNOSIS — M6281 Muscle weakness (generalized): Secondary | ICD-10-CM | POA: Insufficient documentation

## 2023-07-11 DIAGNOSIS — M25551 Pain in right hip: Secondary | ICD-10-CM | POA: Diagnosis present

## 2023-07-11 DIAGNOSIS — F39 Unspecified mood [affective] disorder: Secondary | ICD-10-CM

## 2023-07-11 DIAGNOSIS — R262 Difficulty in walking, not elsewhere classified: Secondary | ICD-10-CM | POA: Diagnosis present

## 2023-07-11 DIAGNOSIS — F331 Major depressive disorder, recurrent, moderate: Secondary | ICD-10-CM

## 2023-07-11 DIAGNOSIS — F339 Major depressive disorder, recurrent, unspecified: Secondary | ICD-10-CM

## 2023-07-11 DIAGNOSIS — F411 Generalized anxiety disorder: Secondary | ICD-10-CM

## 2023-07-11 DIAGNOSIS — R2689 Other abnormalities of gait and mobility: Secondary | ICD-10-CM | POA: Insufficient documentation

## 2023-07-11 DIAGNOSIS — F4310 Post-traumatic stress disorder, unspecified: Secondary | ICD-10-CM

## 2023-07-11 DIAGNOSIS — E038 Other specified hypothyroidism: Secondary | ICD-10-CM

## 2023-07-11 DIAGNOSIS — R29898 Other symptoms and signs involving the musculoskeletal system: Secondary | ICD-10-CM | POA: Insufficient documentation

## 2023-07-11 DIAGNOSIS — G5791 Unspecified mononeuropathy of right lower limb: Secondary | ICD-10-CM

## 2023-07-11 DIAGNOSIS — F515 Nightmare disorder: Secondary | ICD-10-CM

## 2023-07-11 NOTE — Telephone Encounter (Signed)
 06/20/23 last apt

## 2023-07-14 ENCOUNTER — Ambulatory Visit (HOSPITAL_COMMUNITY): Payer: Medicare Other | Admitting: Physical Therapy

## 2023-07-14 DIAGNOSIS — M25551 Pain in right hip: Secondary | ICD-10-CM

## 2023-07-14 DIAGNOSIS — R296 Repeated falls: Secondary | ICD-10-CM | POA: Diagnosis not present

## 2023-07-14 DIAGNOSIS — R29898 Other symptoms and signs involving the musculoskeletal system: Secondary | ICD-10-CM

## 2023-07-14 DIAGNOSIS — M6281 Muscle weakness (generalized): Secondary | ICD-10-CM

## 2023-07-14 DIAGNOSIS — R2689 Other abnormalities of gait and mobility: Secondary | ICD-10-CM

## 2023-07-14 DIAGNOSIS — R262 Difficulty in walking, not elsewhere classified: Secondary | ICD-10-CM

## 2023-07-14 NOTE — Therapy (Signed)
 OUTPATIENT PHYSICAL THERAPY TREATMENT   Patient Name: Jennifer Davidson MRN: [id number] DOB:06/22/1955, 68 y.o., female Today's Date: 07/14/2023   PCP: Tollie Eth, NP REFERRING PROVIDER: Tollie Eth, NP END OF SESSION:  PT End of Session - 07/14/23 1418     Visit Number 2    Number of Visits 8    Date for PT Re-Evaluation 08/08/23    Authorization Type Medicare    Progress Note Due on Visit 10    PT Start Time 1110    PT Stop Time 1150    PT Time Calculation (min) 40 min    Activity Tolerance Patient tolerated treatment well    Behavior During Therapy [facility] for tasks assessed/performed              Past Medical History:  Diagnosis Date   Acute cystitis without hematuria    Acute encephalopathy 06/25/2016   Altered mental status 06/23/2017   Anxiety    Anxiety and depression 02/04/2012   Arthritis    Cataract 08/31/2017   left eye   CHF (congestive heart failure) (HCC) 2017   resolved   Cholesterol serum increased    Chronic back pain    chronic Rt low back pain. s/p L4-5 fusion. failed Rt facet injections. poss due to Rt SI joint dysfunction.   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   Depression    Diabetes mellitus without complication (HCC)    Diastolic heart failure (HCC) 03/17/2012   Edema 02/04/2012   Encounter for long-term (current) use of medications 02/04/2012   Fall as cause of accidental injury at home as place of occurrence 09/08/2018   Gastroenteritis 01/13/2021   GERD (gastroesophageal reflux disease) 11/13/2015   Hallucination, visual 09/14/2017   Head injury 09/08/2018   Hyperlipidemia    Hyperparathyroidism (HCC)    Hyperparathyroidism, 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   [facility] Irena Cords, MD  Plan:  25 Hydroxy Vit D (59.1 ng/ml)    Hypothyroidism     Moderate episode of recurrent major depressive disorder (HCC) 08/31/2016   PONV (postoperative nausea and vomiting)    Seizures (HCC)    3 years ago,2018   Sepsis (HCC) 04/29/2020   Sleep apnea    cpap   Syncope 08/30/2015   Syncope and collapse 02/12/2012   Tachycardia 02/04/2012   Trochanteric bursitis of both hips 2012   Confirmed on MRI   Past Surgical History:  Procedure Laterality Date   ABDOMINAL HYSTERECTOMY     APPENDECTOMY  1986   BACK SURGERY     L4-5 fusion   CATARACT EXTRACTION     KNEE SURGERY     REVERSE SHOULDER ARTHROPLASTY Left 07/03/2020   Procedure: REVERSE SHOULDER ARTHROPLASTY;  Surgeon: Jones Broom, MD;  Location: [facility];  Service: Orthopedics;  Laterality: Left;   REVERSE SHOULDER ARTHROPLASTY Right 03/05/2021   Procedure: REVERSE SHOULDER ARTHROPLASTY;  Surgeon: Jones Broom, MD;  Location: [facility];  Service: Orthopedics;  Laterality: Right;   SHOULDER ARTHROSCOPY WITH SUBACROMIAL DECOMPRESSION Left 10/20/2018   Procedure: SHOULDER ARTHROSCOPY, ROTATOR CUFF DEBRIDEMENT, GLENOHUMERAL JOINT DEBRIDEMENT, ACROMIOPLASTY, DISTAL CLAVICLE RESECTION;  Surgeon: Jodi Geralds, MD;  Location: [facility];  Service: Orthopedics;  Laterality: Left;   Patient Active Problem List   Diagnosis Date Noted   Frequent falls 06/20/2023   Slurred speech 06/20/2023  Wheezing 02/22/2023   Benign paroxysmal positional vertigo 02/22/2023   Vitamin D deficiency 02/15/2023   Dizziness 06/24/2022   Recurrent oral ulcers 09/29/2021   Primary insomnia 08/27/2021   OSA (obstructive sleep apnea) 07/08/2021   Nausea and vomiting 07/08/2021   Rotator cuff tear arthropathy of right shoulder 02/20/2021   Allergies 05/06/2020   Rotator cuff tear arthropathy of left shoulder 10/20/2018   Arthritis of left acromioclavicular joint 10/20/2018   Prediabetes 09/14/2017   Dream anxiety disorder 09/14/2017   PTSD (post-traumatic stress disorder) 08/31/2016   Gastroesophageal reflux disease  11/13/2015   Generalized anxiety disorder 06/14/2015   Neuropathy of lower extremity 05/14/2015   Headache, migraine 01/17/2015   Major depression, recurrent, chronic (HCC) 12/02/2014   Mood disorder (HCC) 12/01/2014   Chronic back pain    Chronic kidney disease 02/04/2012   Hypothyroid 02/04/2012   Mixed hyperlipidemia 02/04/2012    ONSET DATE: 2.5 months  REFERRING DIAG: R29.6 (ICD-10-CM) - Frequent falls  THERAPY DIAG:  Frequent falls  Difficulty in walking, not elsewhere classified  Balance disorder  Weakness of both lower extremities  Pain in right hip  Muscle weakness (generalized)  Rationale for Evaluation and Treatment: Rehabilitation  SUBJECTIVE:                                                                                                                                                                                             SUBJECTIVE STATEMENT: 07/14/23:  pt reports soreness in Rt hip today is worse than the pain . Currently 5/10.  Reports compliance with HEP.  4 really bad falls in the past 6 months; 3 she got off balance; 1 she tripped and fell and broke right shoulder in early December so just came out of the sling; break was just behind the shoulder replacement.  Does have neuropathy in her feet.  "Legs are weak" get tired quickly.  Started taking iron pills about a week ago for low hemoglobin; does have some "lightheadedness" if she gets up too quickly or moves to fast.  Uses QC at all times Pt accompanied by: self and significant other  PERTINENT HISTORY: TSA right 07/2020; 02/2022; fused L4-5 2011; L2-L3 "has trouble"; had an injection in right hip last week for bursitis; has had 3 shots  PAIN:  Are you having pain? Yes: NPRS scale: 8/10 Pain location: right greater troch Pain description: constant and sore, achy Aggravating factors: unknown Relieving factors: ice pack  PRECAUTIONS: Fall   FALLS: Has patient fallen in last 6 months? Yes. Number  of falls 4  PLOF: Independent uses a QC at baseline  PATIENT GOALS: to  be able to get my legs stronger so I can get outside in the yard  OBJECTIVE:  Note: Objective measures were completed at Evaluation unless otherwise noted.  DIAGNOSTIC FINDINGS:   COGNITION: Overall cognitive status: Within functional limits for tasks assessed   SENSATION: Neuropathy bilateral feet and ankles  COORDINATION: Wfl heel to shin  EDEMA:  Swelling in ankles bilaterally  unknown reason    LOWER EXTREMITY ROM:     MMT Right Eval Left Eval  Hip flexion 3+ 4-  Hip extension 3- 3-  Hip abduction 3- 3-  Hip adduction    Hip internal rotation    Hip external rotation    Knee flexion 3- 3-  Knee extension 3+ 4+  Ankle dorsiflexion 3+ 4  Ankle plantarflexion    Ankle inversion    Ankle eversion     (Blank rows = not tested)  BED MOBILITY:  Sit to supine Modified independence Supine to sit Min A Rolling to Right Modified independence Rolling to Left Modified independence  TRANSFERS: Assistive device utilized: None  Sit to stand: Modified independence Stand to sit: Modified independence Chair to chair: SBA Floor:  not tested   STAIRS: Next visit 2/27:  7" step height, 1 HR, step to gait GAIT: Gait pattern: decreased arm swing- Right, decreased arm swing- Left, and decreased stride length Distance walked: 60 ft in PT gym Assistive device utilized: Quad cane small base Level of assistance: Modified independence Comments: decreased gait speed; guarding right arm  FUNCTIONAL TESTS:  5 times sit to stand: 22.61 no UE assist; fatigued afterwards Timed up and go (TUG): 26.48 sec with QC Dynamic Gait Index: 11/24 SLS  right 2" and left 5"  PATIENT SURVEYS:  ABC scale 48.8%                                                                                                                              TREATMENT DATE:  07/14/23 Goal review Functional testing not completed at  evaluation:  ABC scale 48.8%  DGI 11/24  Stair negotiation 7" step with 1 HR step to DGI 1. Gait level surface (2) Mild Impairment: Walks 20', uses assistive devices, slower speed, mild gait deviations. 2. Change in gait speed (1) Moderate Impairment: Makes only minor adjustments to walking speed, or accomplishes a change in speed with significant gait deviations, or changes speed but has significant gait deviations, or changes speed but loses balance but is able to recover and continue walking. 3. Gait with horizontal head turns (1) Moderate Impairment: Performs head turns with moderate change in gait velocity, slows down, staggers but recovers, can continue to walk. 4. Gait with vertical head turns (0) Severe Impairment: Performs task with severe disruption of gait, i.e., staggers outside 15" path, loses balance, stops, reaches for wall. 5. Gait and pivot turn (2) Mild Impairment: Pivot turns safely in > 3 seconds and stops with no loss of balance. 6. Step over obstacle (2) Mild  Impairment: Is able to step over box, but must slow down and adjust steps to clear box safely. 7. Step around obstacles (2) Mild Impairment: Is able to step around both cones, but must slow down and adjust steps to clear cones. 8. Stairs (1) Moderate Impairment: Two feet to a stair, must use rail.  TOTAL SCORE: 11 / 24 Seated:  sit to stands no UE 10X  Heel/toe raises 10X each  LAQ 10X5"  Hip flexion 10X3" Standing:  stair negotiation  SLS max of 7" each without UE assist  Tandem stance 30" each no UE  Vectors (3 way kick) 15X3" each with 1 UE assist    07/11/23 physical therapy evaluation and HEP instruction    PATIENT EDUCATION: Education details: Patient educated on exam findings, POC, scope of PT, HEP, and what to expect next visit. Person educated: Patient Education method: Explanation, Demonstration, and Handouts Education comprehension: verbalized understanding, returned demonstration, verbal  cues required, and tactile cues required   HOME EXERCISE PROGRAM: Access Code: [email or web address] URL: [email or web address] Date: 07/11/2023 Prepared by: [facility] - Rehab  Exercises - Sit to Stand  - 2 x daily - 7 x weekly - 1 sets - 5 reps - Seated Heel Toe Raises  - 2 x daily - 7 x weekly - 1 sets - 10 reps - Supine Bridge  - 1 x daily - 7 x weekly - 3 sets - 10 reps - standing single leg balance at the counter (try to not hold on)  - 1 x daily - 7 x weekly - 1 sets - 5 reps - 10 sec hold  Date: 07/14/2023 Prepared by: Emeline Gins - Seated Long Arc Quad  - 2 x daily - 7 x weekly - 10 reps - 5"  hold - Seated March  - 2 x daily - 7 x weekly - 10 reps - 3" hold - Standing Tandem Balance with Counter Support  - 2 x daily - 7 x weekly - 3 reps - 30" hold - Standing 3-Way Kick  - 2 x daily - 7 x weekly - 1 sets - 5 reps - 3" hold  GOALS: Goals reviewed with patient? No  SHORT TERM GOALS: Target date: 07/25/2023  patient will be independent with initial HEP  Baseline: Goal status: IN PROGRESS  2.  Patient will report 30% improvement overall   Baseline:  Goal status: IN PROGRESS   LONG TERM GOALS: Target date: 08/08/2023  Patient will be independent in self management strategies to improve quality of life and functional outcomes.  Baseline:  Goal status: IN PROGRESS  2.  Patient will report 50% improvement overall   Baseline:  Goal status: IN PROGRESS  3.  Patient will be able to stand SLS x 10" each leg to demonstrate improved functional balance.   Baseline: Right 2" and left 5" Goal status: IN PROGRESS  4.   Patient will increase right leg MMT's to 4+ to 5/5 to allow navigation of steps without gait deviation or loss of balance  Baseline: see above Goal status: IN PROGRESS  5.  Patient will improve 5 times sit to stand score to 15 sec or less to demonstrate improved functional mobility and increased leg strength.    Baseline: 22.61 Goal status: IN  PROGRESS  6.  Patient will improve TUG score to 20 sec or less to demonstrate decreased fall risk and improved functional mobility Baseline: 26.48 sec Goal status: IN PROGRESS  ASSESSMENT:  CLINICAL IMPRESSION: Reviewed goals and POC moving  forward.  Completed remaining test measures per plan for today's session.  Pt with noted balance and stability impairments only scoring 11/24 on DGI with vertical head movements being most difficult with maintaining gait stability  ABC completed with 48% impairment as well.  Pt was able to negotiate stairs safely with step to pattern and use of HR.  Added LE strengthening/stability exercises and added to HEP today.  No pain or issues at end of session today . Patient will continue to benefit from skilled physical therapy services to address her deficits to improve level of function with ADLs, functional mobility tasks, and reduce risk for falls.    OBJECTIVE IMPAIRMENTS: Abnormal gait, decreased activity tolerance, decreased balance, decreased endurance, decreased mobility, difficulty walking, decreased ROM, decreased strength, impaired perceived functional ability, and pain.   ACTIVITY LIMITATIONS: carrying, lifting, standing, squatting, stairs, transfers, and locomotion level  PARTICIPATION LIMITATIONS: meal prep, cleaning, laundry, shopping, community activity, and yard work    [facility] POTENTIAL: Good  CLINICAL DECISION MAKING: Evolving/moderate complexity  EVALUATION COMPLEXITY: Moderate  PLAN:  PT FREQUENCY: 2x/week  PT DURATION: 4 weeks  PLANNED INTERVENTIONS: 97164- PT Re-evaluation, 97110-Therapeutic exercises, 97530- Therapeutic activity, [id number]- Neuromuscular re-education, 97535- Self Care, 16109- Manual therapy, [id number]- Gait training, [phone]- Orthotic Fit/training, [phone]- Canalith repositioning, [id number]- Aquatic Therapy, [phone]- Splinting, Patient/Family education, Balance training, Stair training, Taping, Dry Needling, Joint mobilization, Joint  manipulation, Spinal manipulation, Spinal mobilization, Scar mobilization, and DME instructions.   PLAN FOR NEXT SESSION:continue to improve LE strength and stability.   2:18 PM, 07/14/23 Lurena Nida, PTA/CLT [facility] Ph: [phone]

## 2023-07-18 ENCOUNTER — Encounter (HOSPITAL_COMMUNITY): Payer: Medicare Other

## 2023-07-18 ENCOUNTER — Encounter (HOSPITAL_COMMUNITY): Payer: Self-pay

## 2023-07-26 ENCOUNTER — Ambulatory Visit: Payer: Medicare Other

## 2023-07-26 ENCOUNTER — Encounter (HOSPITAL_COMMUNITY): Payer: Self-pay

## 2023-07-26 DIAGNOSIS — Z0000 Encounter for general adult medical examination without abnormal findings: Secondary | ICD-10-CM | POA: Diagnosis not present

## 2023-07-26 NOTE — Progress Notes (Signed)
 Subjective:   Jennifer Davidson is a 67 y.o. who presents for a Medicare Wellness preventive visit.  Visit Complete: Virtual I connected with  Jennifer Davidson on 07/26/23 by a audio enabled telemedicine application and verified that I am speaking with the correct person using two identifiers.  Patient Location: Home  Provider Location: Office/Clinic  I discussed the limitations of evaluation and management by telemedicine. The patient expressed understanding and agreed to proceed.  Vital Signs: Because this visit was a virtual/telehealth visit, some criteria may be missing or patient reported. Any vitals not documented were not able to be obtained and vitals that have been documented are patient reported.  VideoError- Librarian, academic were attempted between this provider and patient, however failed, due to patient having technical difficulties OR patient did not have access to video capability.  We continued and completed visit with audio only.   AWV Questionnaire: No: Patient Medicare AWV questionnaire was not completed prior to this visit.  Cardiac Risk Factors include: advanced age (>78men, >67 women)     Objective:    Today's Vitals   07/26/23 1326  PainSc: 8    There is no height or weight on file to calculate BMI.     07/26/2023    1:35 PM 07/11/2023    2:32 PM 06/09/2023    8:22 AM 06/03/2023    8:33 PM 03/03/2023   11:48 AM 07/20/2022    1:42 PM 03/03/2021    8:49 AM  Advanced Directives  Does Patient Have a Medical Advance Directive? No No No No No No No  Would patient like information on creating a medical advance directive? No - Patient declined Yes (MAU/Ambulatory/Procedural Areas - Information given)   Yes (MAU/Ambulatory/Procedural Areas - Information given) No - Patient declined No - Patient declined    Current Medications (verified) Outpatient Encounter Medications as of 07/26/2023  Medication Sig   albuterol (VENTOLIN HFA) 108 (90 Base)  MCG/ACT inhaler TAKE 2 PUFFS BY MOUTH EVERY 6 HOURS AS NEEDED FOR WHEEZE OR SHORTNESS OF BREATH   ALPRAZolam (XANAX) 0.5 MG tablet MAY TAKE ONE TABLET UP TO 2 TIMES A DAY FOR ANXIETY. AVOID TAKING DAILY, IF POSSIBLE.   AMBULATORY NON FORMULARY MEDICATION Continuous positive airway pressure (CPAP) machine set at 15 cm of H2O pressure, with all supplemental supplies as needed.   AMBULATORY NON FORMULARY MEDICATION Continuous positive airway pressure (CPAP) machine set at 15 cm of H2O pressure. Supplemental supplies: Headgear with q 6 months, Full face mask q 3 months, Full face cushion q 3 months, 1 standard tubing q 3 months, 1 water chamber q 6 months. 1 reusable filter q 6 months, 6 disposable filters q 3 months. Mask F-29.   aspirin EC 81 MG tablet Take 1 tablet (81 mg total) by mouth daily.   atorvastatin (LIPITOR) 20 MG tablet    blood glucose meter kit and supplies KIT Dispense based on patient and insurance preference. Use up to four times daily as directed. Check blood sugar three times weekly, then as needed.   buPROPion (WELLBUTRIN XL) 150 MG 24 hr tablet TAKE 1 TABLET BY MOUTH EVERY DAY   busPIRone (BUSPAR) 7.5 MG tablet TAKE 2 TABLETS (15 MG TOTAL) BY MOUTH 3 (THREE) TIMES DAILY.   cetirizine (ZYRTEC) 10 MG tablet TAKE 1 TABLET BY MOUTH EVERY DAY   diclofenac sodium (VOLTAREN) 1 % GEL Apply 2 g topically daily as needed (for knee pain).   DULoxetine (CYMBALTA) 60 MG capsule TAKE  1 CAPSULE BY MOUTH EVERY DAY   famotidine (PEPCID) 20 MG tablet TAKE 1 TABLET BY MOUTH TWICE A DAY   fluticasone (FLONASE) 50 MCG/ACT nasal spray PLACE 2 SPRAYS AT BEDTIME INTO BOTH NOSTRILS.   gabapentin (NEURONTIN) 600 MG tablet TAKE 1 TABLET (600 MG TOTAL) BY MOUTH EVERY MORNING AND 1.5 TABLETS (900 MG TOTAL) AT BEDTIME.   hydrOXYzine (VISTARIL) 50 MG capsule Take 1-2 tablets by mouth at bedtime for sleep and anxiety. Start with the lowest dose. May take 1 tablet up to 2 times during the day for acute anxiety  symptoms only if needed.   levothyroxine (SYNTHROID) 50 MCG tablet TAKE 1 TABLET (50 MCG TOTAL) BY MOUTH EVERY OTHER DAY. BEFORE BREAKFAST. ALTERNATE WITH TABLET   levothyroxine (SYNTHROID) 75 MCG tablet TAKE 1 TABLET (75 MCG TOTAL) BY MOUTH EVERY OTHER DAY. BEFORE BREAKFAST. ALTERNATE WITH TABLET   meclizine (ANTIVERT) 50 MG tablet Take 0.5 tablets (25 mg total) by mouth 2 (two) times daily as needed for dizziness. (Patient taking differently: Take 50 mg by mouth 2 (two) times daily as needed for dizziness.)   metFORMIN (GLUCOPHAGE) 500 MG tablet TAKE 1 TABLET BY MOUTH TWICE A DAY WITH FOOD   metoCLOPramide (REGLAN) 10 MG tablet TAKE 0.5 TABLET BY MOUTH 3 (THREE) TIMES DAILY AS NEEDED FOR NAUSEA OR VOMITING (NAUSEA/REFLUX).   Multiple Vitamins-Minerals (MULTIVITAMIN WITH MINERALS) tablet Take 1 tablet by mouth daily.   ondansetron (ZOFRAN-ODT) 8 MG disintegrating tablet Take 1 tablet (8 mg total) by mouth every 8 (eight) hours as needed for nausea or vomiting.   pantoprazole (PROTONIX) 40 MG tablet Take 1 tablet (40 mg total) by mouth 2 (two) times daily.   Polyvinyl Alcohol-Povidone PF (REFRESH) 1.4-0.6 % SOLN Place 1 drop into both eyes daily as needed (dry eyes).   Probiotic Product (ALIGN) 4 MG CAPS Take 1 capsule (4 mg total) by mouth daily.   promethazine (PHENERGAN) 12.5 MG tablet Take 1 tablet (12.5 mg total) by mouth every 8 (eight) hours as needed for nausea or vomiting.   promethazine (PHENERGAN) 25 MG suppository Place 1 suppository (25 mg total) rectally every 6 (six) hours as needed for nausea or vomiting.   QUEtiapine (SEROQUEL) 300 MG tablet TAKE 1 TABLET BY MOUTH EVERYDAY AT BEDTIME   Rimegepant Sulfate (NURTEC) 75 MG TBDP Take 75 mg by mouth daily as needed.   rizatriptan (MAXALT-MLT) 10 MG disintegrating tablet TAKE 1 TABLET BY MOUTH AS NEEDED FOR MIGRAINE. MAY REPEAT IN 2 HOURS IF NEEDED   trolamine salicylate (ASPERCREME) 10 % cream Apply 1 application topically as  needed for muscle pain.   valACYclovir (VALTREX) 500 MG tablet Take 1 tablet (500 mg total) by mouth daily.   zonisamide (ZONEGRAN) 100 MG capsule TAKE 1 CAPSULE BY MOUTH EVERY DAY   No facility-administered encounter medications on file as of 07/26/2023.    Allergies (verified) Tramadol and Trazodone and nefazodone   History: Past Medical History:  Diagnosis Date   Acute cystitis without hematuria    Acute encephalopathy 06/25/2016   Altered mental status 06/23/2017   Anxiety    Anxiety and depression 02/04/2012   Arthritis    Cataract 08/31/2017   left eye   CHF (congestive heart failure) (HCC) 2017   resolved   Cholesterol serum increased    Chronic back pain    chronic Rt low back pain. s/p L4-5 fusion. failed Rt facet injections. poss due to Rt SI joint dysfunction.   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   Depression    Diabetes mellitus without complication (HCC)    Diastolic heart failure (HCC) 03/17/2012   Edema 02/04/2012   Encounter for long-term (current) use of medications 02/04/2012   Fall as cause of accidental injury at home as place of occurrence 09/08/2018   Gastroenteritis 01/13/2021   GERD (gastroesophageal reflux disease) 11/13/2015   Hallucination, visual 09/14/2017   Head injury 09/08/2018   Hyperlipidemia    Hyperparathyroidism (HCC)    Hyperparathyroidism, 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   [facility] Irena Cords, MD  Plan:  25 Hydroxy Vit D (59.1 ng/ml)    Hypothyroidism    Moderate episode of recurrent major depressive disorder (HCC) 08/31/2016   PONV (postoperative nausea and vomiting)    Seizures (HCC)    3 years ago,2018   Sepsis (HCC) 04/29/2020   Sleep apnea    cpap   Syncope 08/30/2015   Syncope and collapse 02/12/2012   Tachycardia 02/04/2012    Trochanteric bursitis of both hips 2012   Confirmed on MRI   Past Surgical History:  Procedure Laterality Date   ABDOMINAL HYSTERECTOMY     APPENDECTOMY  1986   BACK SURGERY     L4-5 fusion   CATARACT EXTRACTION     KNEE SURGERY     REVERSE SHOULDER ARTHROPLASTY Left 07/03/2020   Procedure: REVERSE SHOULDER ARTHROPLASTY;  Surgeon: Jones Broom, MD;  Location: [facility];  Service: Orthopedics;  Laterality: Left;   REVERSE SHOULDER ARTHROPLASTY Right 03/05/2021   Procedure: REVERSE SHOULDER ARTHROPLASTY;  Surgeon: Jones Broom, MD;  Location: [facility];  Service: Orthopedics;  Laterality: Right;   SHOULDER ARTHROSCOPY WITH SUBACROMIAL DECOMPRESSION Left 10/20/2018   Procedure: SHOULDER ARTHROSCOPY, ROTATOR CUFF DEBRIDEMENT, GLENOHUMERAL JOINT DEBRIDEMENT, ACROMIOPLASTY, DISTAL CLAVICLE RESECTION;  Surgeon: Jodi Geralds, MD;  Location: [facility];  Service: Orthopedics;  Laterality: Left;   Family History  Problem Relation Age of Onset   Diabetes Mother    Heart disease Mother    Kidney disease Mother    Thyroid disease Mother    Heart disease Father    Seizures Father    COPD Father    Irritable bowel syndrome Father    Cancer Maternal Grandmother    Diabetes Sister    Diabetes Brother    Colon cancer Neg Hx    Stomach cancer Neg Hx    Colon polyps Neg Hx    Esophageal cancer Neg Hx    Rectal cancer Neg Hx    Social History   Socioeconomic History   Marital status: Married    Spouse name: Merchant navy officer   Number of children: 3   Years of education: 12   Highest education level: Some college, no degree  Occupational History   Occupation: Retired  Tobacco Use   Smoking status: Never   Smokeless tobacco: Never  Vaping Use   Vaping status: Never Used  Substance and Sexual Activity   Alcohol use: Never   Drug use: Never    Comment: 08-31-2016 PER PT NO    Sexual activity: Yes    Partners: Male    Comment: married  Other Topics Concern   Not on file  Social History Narrative    Right handed   Lives with husband one story   Three adult children that live close by. Close relationship with grandchildren    Social  Drivers of Health   Financial Resource Strain: Low Risk  (07/26/2023)   Overall Financial Resource Strain (CARDIA)    Difficulty of Paying Living Expenses: Not hard at all  Food Insecurity: No Food Insecurity (07/26/2023)   Hunger Vital Sign    Worried About Running Out of Food in the Last Year: Never true    Ran Out of Food in the Last Year: Never true  Transportation Needs: No Transportation Needs (07/26/2023)   PRAPARE - Administrator, Civil Service (Medical): No    Lack of Transportation (Non-Medical): No  Physical Activity: Sufficiently Active (07/26/2023)   Exercise Vital Sign    Days of Exercise per Week: 3 days    Minutes of Exercise per Session: 50 min  Stress: Stress Concern Present (07/26/2023)   [employer] of Occupational Health - Occupational Stress Questionnaire    Feeling of Stress : To some extent  Social Connections: Moderately Isolated (07/26/2023)   Social Connection and Isolation Panel [NHANES]    Frequency of Communication with Friends and Family: More than three times a week    Frequency of Social Gatherings with Friends and Family: Not on file    Attends Religious Services: Never    Database administrator or Organizations: No    Attends Engineer, structural: Not on file    Marital Status: Married    Tobacco Counseling Counseling given: Not Answered    Clinical Intake:  Pre-visit preparation completed: Yes  Pain : 0-10 Pain Score: 8  Pain Type: Chronic pain Pain Location: Hip Pain Orientation: Right Pain Descriptors / Indicators: Aching Pain Onset: More than a month ago Pain Frequency: Constant     Nutritional Risks: None Diabetes: No  How often do you need to have someone help you when you read instructions, pamphlets, or other written materials from your doctor or pharmacy?: 1 -  Never  Interpreter Needed?: No  Information entered by :: NAllen LPN   Activities of Daily Living     07/26/2023    1:27 PM  In your present state of health, do you have any difficulty performing the following activities:  Hearing? 0  Vision? 0  Difficulty concentrating or making decisions? 0  Walking or climbing stairs? 1  Comment due to hip  Dressing or bathing? 0  Doing errands, shopping? 0  Preparing Food and eating ? N  Using the Toilet? N  In the past six months, have you accidently leaked urine? N  Do you have problems with loss of bowel control? N  Managing your Medications? N  Managing your Finances? N  Housekeeping or managing your Housekeeping? N    Patient Care Team: Early, Sung Amabile, NP as PCP - General (Nurse Practitioner) Quintella Reichert, MD as PCP - Cardiology (Cardiology) Estill Bamberg, MD as Consulting Physician (Orthopedic Surgery) Jethro Bolus, MD as Consulting Physician (Ophthalmology) Claria Dice, MD as Attending Physician (Physical Medicine and Rehabilitation)  Indicate any recent Medical Services you may have received from other than [facility] providers in the past year (date may be approximate).     Assessment:   This is a routine wellness examination for Aryianna.  Hearing/Vision screen Hearing Screening - Comments:: Denies hearing issues Vision Screening - Comments:: Regular eye exams, [facility]   Goals Addressed             This Visit's Progress    Patient Stated       07/26/2023, wants to walk better  Depression Screen     07/26/2023    1:37 PM 06/20/2023    9:06 AM 07/20/2022    1:44 PM 09/23/2021    9:18 AM 08/20/2021    2:37 PM 05/26/2021   12:31 PM 02/20/2021    8:33 AM  PHQ 2/9 Scores  PHQ - 2 Score 1 1 0 0 1 1 2   PHQ- 9 Score 5     9 14   Exception Documentation    Medical reason Medical reason      Fall Risk     07/26/2023    1:36 PM 06/20/2023    9:05 AM 06/09/2023    8:21 AM 07/20/2022    1:42 PM 07/16/2022    9:32 PM  Fall  Risk   Falls in the past year? 1 1 1 1 1   Comment tripped coming in door   tripped   Number falls in past yr: 0 1 1 1 1   Injury with Fall? 1 1 1 1 1   Comment hurt hip injured shoulder and hit head  hurt hip   Risk for fall due to : Impaired mobility;Impaired balance/gait;Medication side effect Impaired balance/gait  History of fall(s);Impaired balance/gait;Impaired mobility;Medication side effect   Follow up Falls prevention discussed;Falls evaluation completed Falls evaluation completed Falls evaluation completed Falls evaluation completed;Education provided;Falls prevention discussed     MEDICARE RISK AT HOME:  Medicare Risk at Home Any stairs in or around the home?: No If so, are there any without handrails?: No Home free of loose throw rugs in walkways, pet beds, electrical cords, etc?: Yes Adequate lighting in your home to reduce risk of falls?: Yes Life alert?: No Use of a cane, walker or w/c?: Yes Grab bars in the bathroom?: Yes Shower chair or bench in shower?: Yes Elevated toilet seat or a handicapped toilet?: Yes  TIMED UP AND GO:  Was the test performed?  No  Cognitive Function: 6CIT completed        07/26/2023    1:39 PM 07/20/2022    1:45 PM 08/21/2020    8:49 AM 05/16/2019    9:14 AM 04/18/2017    9:49 AM  6CIT Screen  What Year? 0 points 0 points 0 points 0 points 0 points  What month? 0 points 0 points 0 points 0 points 0 points  What time? 0 points 0 points 0 points 0 points 0 points  Count back from 20 0 points 0 points 0 points 0 points 0 points  Months in reverse 0 points 0 points 0 points 0 points 0 points  Repeat phrase 4 points 2 points 0 points 4 points 0 points  Total Score 4 points 2 points 0 points 4 points 0 points    Immunizations Immunization History  Administered Date(s) Administered   Influenza Split 02/18/2012, 02/13/2022   Influenza, High Dose Seasonal PF 02/17/2020, 01/20/2021   Influenza,inj,Quad PF,6+ Mos 02/16/2013, 04/18/2017,  02/28/2018   Influenza-Unspecified 03/25/2014, 01/02/2016   Moderna Sars-Covid-2 Vaccination 08/22/2019, 09/19/2019, 05/06/2020   Pfizer Covid-19 Vaccine Bivalent Booster 73yrs & up 01/15/2021   Pfizer(Comirnaty)Fall Seasonal Vaccine 12 years and older 03/11/2023   Pneumococcal Conjugate-13 02/19/2019   Pneumococcal Polysaccharide-23 06/21/2014, 12/02/2014   RSV,unspecified 06/17/2022   Tdap 09/18/2020   Unspecified SARS-COV-2 Vaccination 03/30/2022   Zoster Recombinant(Shingrix) 12/15/2020, 02/14/2021   Zoster, Live 08/06/2014    Screening Tests Health Maintenance  Topic Date Due   Pneumonia Vaccine 53+ Years old (3 of 3 - PPSV23 or PCV20) 02/19/2024   Medicare Annual Wellness (AWV)  07/25/2024   MAMMOGRAM  01/03/2025   Colonoscopy  07/09/2029   DTaP/Tdap/Td (2 - Td or Tdap) 09/19/2030   INFLUENZA VACCINE  Completed   DEXA SCAN  Completed   COVID-19 Vaccine  Completed   Zoster Vaccines- Shingrix  Completed   HPV VACCINES  Aged Out   Hepatitis C Screening  Discontinued    Health Maintenance  There are no preventive care reminders to display for this patient.  Health Maintenance Items Addressed: Up to date  Additional Screening:  Vision Screening: Recommended annual ophthalmology exams for early detection of glaucoma and other disorders of the eye.  Dental Screening: Recommended annual dental exams for proper oral hygiene  Community Resource Referral / Chronic Care Management: CRR required this visit?  No   CCM required this visit?  No     Plan:     I have personally reviewed and noted the following in the patient's chart:   Medical and social history Use of alcohol, tobacco or illicit drugs  Current medications and supplements including opioid prescriptions. Patient is not currently taking opioid prescriptions. Functional ability and status Nutritional status Physical activity Advanced directives List of other physicians Hospitalizations, surgeries, and  ER visits in previous 12 months Vitals Screenings to include cognitive, depression, and falls Referrals and appointments  In addition, I have reviewed and discussed with patient certain preventive protocols, quality metrics, and best practice recommendations. A written personalized care plan for preventive services as well as general preventive health recommendations were provided to patient.     Barb Merino, LPN   [id number]/62/[id number]   After Visit Summary: (MyChart) Due to this being a telephonic visit, the after visit summary with patients personalized plan was offered to patient via MyChart   Notes: Nothing significant to report at this time.

## 2023-07-26 NOTE — Therapy (Signed)
 [facility] Outpatient Rehabilitation at [facility] [address] [place], [place], [address] Phone: [phone]   Fax:  [phone]  Patient Details  Name: Jennifer Davidson MRN: [id number] Date of Birth: 19-May-1955 Referring Provider:  No ref. provider found PHYSICAL THERAPY DISCHARGE SUMMARY  Visits from Start of Care: 2  Current functional level related to goals / functional outcomes: unknown   Remaining deficits: unknown   Education / Equipment: HEP   Patient agrees to discharge. Patient goals were not met. Patient is being discharged due to the patient's request.   [facility] Outpatient Rehabilitation at [facility] [address] [place], [place], [address] Phone: [phone]   Fax:  [phone]

## 2023-07-26 NOTE — Patient Instructions (Signed)
 [name] , Thank you for taking time to come for your Medicare Wellness Visit. I appreciate your ongoing commitment to your health goals. Please review the following plan we discussed and let me know if I can assist you in the future.   Referrals/Orders/Follow-Ups/Clinician Recommendations: none  This is a list of the screening recommended for you and due dates:  Health Maintenance  Topic Date Due   Pneumonia Vaccine (3 of 3 - PPSV23 or PCV20) 02/19/2024   Medicare Annual Wellness Visit  07/25/2024   Mammogram  01/03/2025   Colon Cancer Screening  07/09/2029   DTaP/Tdap/Td vaccine (2 - Td or Tdap) 09/19/2030   Flu Shot  Completed   DEXA scan (bone density measurement)  Completed   COVID-19 Vaccine  Completed   Zoster (Shingles) Vaccine  Completed   HPV Vaccine  Aged Out   Hepatitis C Screening  Discontinued    Advanced directives: (ACP Link)Information on Advanced Care Planning can be found at [facility] [place] Advance Health Care Directives Advance Health Care Directives. [email or web address]   Next Medicare Annual Wellness Visit scheduled for next year: Yes  insert Preventive Care attachment Insert FALL PREVENTION attachment if needed

## 2023-07-28 ENCOUNTER — Encounter (HOSPITAL_COMMUNITY): Payer: Medicare Other

## 2023-08-01 ENCOUNTER — Encounter (HOSPITAL_COMMUNITY): Payer: Medicare Other | Admitting: Physical Therapy

## 2023-08-03 ENCOUNTER — Encounter (HOSPITAL_COMMUNITY): Payer: Medicare Other | Admitting: Physical Therapy

## 2023-08-08 ENCOUNTER — Encounter (HOSPITAL_COMMUNITY): Payer: Medicare Other | Admitting: Physical Therapy

## 2023-08-09 ENCOUNTER — Encounter: Payer: Self-pay | Admitting: *Deleted

## 2023-08-09 ENCOUNTER — Other Ambulatory Visit (HOSPITAL_BASED_OUTPATIENT_CLINIC_OR_DEPARTMENT_OTHER): Payer: Self-pay | Admitting: Nurse Practitioner

## 2023-08-09 DIAGNOSIS — G43709 Chronic migraine without aura, not intractable, without status migrainosus: Secondary | ICD-10-CM

## 2023-08-09 DIAGNOSIS — G5791 Unspecified mononeuropathy of right lower limb: Secondary | ICD-10-CM

## 2023-08-09 NOTE — Telephone Encounter (Signed)
 Last apt 06/20/23

## 2023-08-10 ENCOUNTER — Telehealth: Payer: Self-pay | Admitting: *Deleted

## 2023-08-10 ENCOUNTER — Encounter (HOSPITAL_COMMUNITY): Payer: Medicare Other

## 2023-08-16 ENCOUNTER — Encounter (HOSPITAL_COMMUNITY): Payer: Medicare Other

## 2023-08-21 ENCOUNTER — Other Ambulatory Visit: Payer: Self-pay | Admitting: Neurology

## 2023-08-21 NOTE — Progress Notes (Signed)
 HPI F never smoker followed for OSA, complicated by CHF, Migraine, HTN, OSA, GERD, DM, Hyperparathyroid, Hypothyroid, Dream Anxiety Disorder, Peripheral Neuropathy, CKD, Anxiety, Depression, PTSD, Hyperlipidemia, Seizure,  HST 09/14/21- AHI 11.1/ hr, desaturation to 83%, body weight 170 lbs  =======================================================================================          08/23/23-       67 yoF never smoker followed for OSA, complicated by CHF, Migraine, HTN, OSA, GERD, DM, Hyperparathyroid, Hypothyroid, Dream Anxiety Disorder, Peripheral Neuropathy, CKD, Anxiety, Depression, PTSD, Hyperlipidemia, Seizure,  -Buspar , Wellbutrin , Cymbalta , Seroquel , Vistaril , gabapentin ,  -Covid vax- 3 Moderna, 1 Phizer        CPAP auto 5-20/ [facility]   ordered 10/16/21    AirSense 10 AutoSet Download compliance-73%, AHI 5.3/hr Body weight today-    174 lbs -----Doing well.  Using CPAP nightly.  DME [facility].  Needs new supplies Discussed the use of AI scribe software for clinical note transcription with the patient, who gave verbal consent to proceed.  History of Present Illness   The patient, with a history of sleep apnea, presents for a routine follow-up. She reports satisfaction with her current CPAP machine, which is about two years old. She sleeps better with the machine and has less morning eye swelling and snoring. She occasionally has nights where she does not sleep well and removes the machine. She denies any growing concerns or problems with the machine. She requests a prescription for new supplies.  The patient also mentions a recent concern about a possible seizure. She is currently working with another doctor to evaluate this issue. She also reports an improvement in her mobility, as she is walking more and has started driving again.     Assessment and Plan;    Obstructive Sleep Apnea She has been using CPAP effectively with improved sleep quality. Current settings are adequate with minor  breakthrough apneas. No major concerns with the machine, but new supplies are needed. Insurance covers replacement every five years, recommended to replace to prevent failure. - Provide prescription for new CPAP supplies. - Advise her to contact [facility] if no response in a week regarding supplies. - Recommend CPAP machine replacement once eligible under insurance. -Emphasize compliance goals  Seizure Evaluation- followed by Neurology Previous concern about possible seizure. Monitoring for resolution without significant issues.    ROS-see HPI   + = positive Constitutional:    +weight loss, night sweats, fevers, chills, fatigue, lassitude. HEENT:    headaches, difficulty swallowing, tooth/dental problems, sore throat,       sneezing, itching, ear ache, nasal congestion, post nasal drip, snoring CV:    chest pain, orthopnea, PND, swelling in lower extremities, anasarca,     dizziness, palpitations Resp:   shortness of breath with exertion or at rest.                productive cough,   non-productive cough, coughing up of blood.              change in color of mucus.  wheezing.   Skin:    rash or lesions. GI:  No-   heartburn, indigestion, abdominal pain, nausea, vomiting, diarrhea,                 change in bowel habits, loss of appetite GU: dysuria, change in color of urine, no urgency or frequency.   flank pain. MS:   joint pain, stiffness, decreased range of motion, back pain. Neuro-     nothing unusual Psych:  change in mood or  affect.  depression or anxiety.   memory loss.  OBJ- Physical Exam General- Alert, Oriented, Affect-appropriate, Distress- none acute, +obese Skin- rash-none, lesions- none, excoriation- none Lymphadenopathy- none Head- atraumatic            Eyes- Gross vision intact, PERRLA, conjunctivae and secretions clear            Ears- Hearing, canals-normal            Nose- Clear, no-Septal dev, mucus, polyps, erosion, perforation             Throat- Mallampati II I-IV,  mucosa clear , drainage- none, tonsils- atrophic, +teeth Neck- flexible , trachea midline, no stridor , thyroid  nl, carotid no bruit Chest - symmetrical excursion , unlabored           Heart/CV- RRR , no murmur , no gallop  , no rub, nl s1 s2                           - JVD- none , edema- none, stasis changes- none, varices- none           Lung- clear to P&A, wheeze- none, cough- none , dullness-none, rub- none           Chest wall-  Abd-  Br/ Gen/ Rectal- Not done, not indicated Extrem- cyanosis- none, clubbing, none, atrophy- none, strength- nl Neuro- grossly intact to observation

## 2023-08-23 ENCOUNTER — Ambulatory Visit: Admitting: Internal Medicine

## 2023-08-23 ENCOUNTER — Encounter: Payer: Self-pay | Admitting: Internal Medicine

## 2023-08-23 VITALS — BP 138/68 | HR 115 | Temp 98.2°F | Ht 62.0 in | Wt 174.8 lb

## 2023-08-23 DIAGNOSIS — G4733 Obstructive sleep apnea (adult) (pediatric): Secondary | ICD-10-CM

## 2023-08-23 NOTE — Progress Notes (Unsigned)
 NEUROLOGY FOLLOW UP OFFICE NOTE  Jennifer Davidson [id number]  Assessment/Plan:   Altered mental status.  Patient has history of psychogenic nonepileptic seizures.  However, she reports these episodes are with different semiology (now right sided twitching) Migraine without aura, without status migrainosus, not intractable Hypertension    72 hour ambulatory EEG.   Migraine prevention:  zonisamide 100mg  daily Migraine rescue:  Nurtec). Rizatriptan as last resort. Limit use of pain relievers to no more than 2 days out of week to prevent risk of rebound or medication-overuse headache. Keep headache diary Discussed [place] law stating no driving for 6 months after suspected seizure Follow up 3 to 4 months.   Subjective:  Jennifer Davidson is a 68 year old right-handed female with CHF, hypothyroidism, depression, anxiety, conversion disorder, OSA who follows up for altered mental status and migraines.  MRI of brain from 06/22/2023 personally reviewed.   UPDATE: Seizure-like activity/altered mental status: MRI of brain with and without contrast on 06/22/2023 showed minimal nonspecific scattered white matter hyperintensities, new from prior brain MRI of 06/24/17 and likely chronic small vessel ischemic changes, as well as mild generalized cerebral atrophy and cervical spondylosis with disc bulges/protrusions at C3-4 and C4-5, mild-moderate canal narrowing at C4-5 and mild canal narrowing at C3-4 and C5-6.  Routine awake and drowsy EEG on 06/13/2023 was normal.  72 hour ambulatory EEG was ordered, but never scheduled because patient never responded to voicemail and letter.    Migraines: Migraines have been controlled.  Current NSAIDS/analgesics:  ASA 81mg  daily, Excedrin Migraine Current triptans:  none Current ergotamine:  none Current anti-emetic:  Zofran ODT 8mg , promethazine 12.5mg  PO and 25mg  PR Current muscle relaxants:  none Current Antihypertensive medications:  prazosin Current Antidepressant  medications:  Cymbalta 60mg  daily, Wellbutrin XL 150mg  Current Anticonvulsant medications:  zonisamide 100mg  QD; gabapentin 600mg  AM and 900mg  PM Current anti-CGRP:  none Current Vitamins/Herbal/Supplements:  none Current Antihistamines/Decongestants:  Flonase, Zyrtec Other therapy:  none Hormone/birth control:  none Other medications:  Seroquel, Synthroid, Buspar   Caffeine:  No coffee.  1 Diet Dr. Reino Kent a day Diet:  Drinks water and Powerade.  Sometimes skips meals Exercise:  Walks  But not routine Depression:  yes; Anxiety:  yes Other pain:  Some shoulder pain. Sleep hygiene:  Poor.  Trouble staying asleep.  Has CPAP.    HISTORY:  Migraines: Migraines since 68 years old.  Over the last 3 months, they have increased in frequency.  No known aggravating factor.  They are are usually severe occipital pounding headaches associated with seeing black spots, nausea, vomiting, photophobia, phonophobia but no speech disturbance, numbness or weakness.  They last 6 to 12 hours. They occur about 3 days a week.  No known triggers.  Laying down in dark quiet room helps.    Seizure-like activity/Altered mental status: She has longstanding history of seizure-like episodes with left sided jerking or confusion.  She has been previously worked up by Scientist, product/process development, Dr. Karel Jarvis, and her events have been determined to be psychogenic.  Long-term EEG monitoring captured at least 3 events, which were normal.    On 06/03/2023, she tripped over her dog and fell striking her head.  She did lose consciousness .  This occurred around 3 PM.  At 7:30 PM she developed slurred speech and seemed confused.  She didn't remember eating a meal.  No other focal neurologic deficits.  Symptoms lasted about 30 minutes and resolved.  She had a headache afterwards.  She  was evaluated in the ED at [facility].  CT head revealed no acute findings and CTA of head and neck revealed no LVO or hemodynamically significant  stenosis.  ECG and labs were negative for arrhythmia or acute coronary syndrome.  Symptoms resolved in the ED.  Concern for seizure, so she was started on Depakote for 14 days.  She reportedly had a similar episode of slurred speech and confusion about 5 days prior.  Episodes lasted an hour.  She has also had a couple of other falls prior to this event, not associated with loss of consciousness.  She just loses her balance.  Denies dizziness.  After the spells, she has jerking of her right arm.  She previously had spells of confusion with left sided jerking, which was determined to be psychogenic.  Recently she reports increased anxiety and sleep deprivation.     Past NSAIDS/analgesics:  Ibuprofen, naproxen, Excedrin Migraine Past abortive triptans:  Sumatriptan 50mg , Maxalt-MLT 10mg  Past abortive ergotamine:  none Past muscle relaxants:  Flexeril, Robaxin, Zanflex Past anti-emetic:  Zofran Past antihypertensive medications:  Propranolol, Lasix Past antidepressant medications:  Amitriptyline 75mg , citalopram Past anticonvulsant medications:  topiramate, divalproex Past anti-CGRP:  Ubrelvy (effective, not covered), Past vitamins/Herbal/Supplements:  Melatonin, MVI Past antihistamines/decongestants:  Meclizine, Allegra Other past therapies:  none   Prior imaging of brain performed, personally reviewed: 02/12/2012:  Normal CT head. 08/09/2016 MRI BRAIN WO:  Negative MRI of brain for age.  No intracranial abnormality. 06/23/2017 CTA HEAD & NECK:  Minor atheromatous change at the carotid bifurcations. No flow-limiting extracranial or intracranial stenosis or dissection.  No acute intracranial findings. No abnormal postcontrast enhancement. 06/24/2017 MRI BRAIN WO:  Stable since 2016 and normal for age noncontrast MRI appearance of the brain.     Family history of headache:  No  PAST MEDICAL HISTORY: Past Medical History:  Diagnosis Date   Acute cystitis without hematuria    Acute  encephalopathy 06/25/2016   Altered mental status 06/23/2017   Anxiety    Anxiety and depression 02/04/2012   Arthritis    Cataract 08/31/2017   left eye   CHF (congestive heart failure) (HCC) 2017   resolved   Cholesterol serum increased    Chronic back pain    chronic Rt low back pain. s/p L4-5 fusion. failed Rt facet injections. poss due to Rt SI joint dysfunction.   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   Depression    Diabetes mellitus without complication (HCC)    Diastolic heart failure (HCC) 03/17/2012   Edema 02/04/2012   Encounter for long-term (current) use of medications 02/04/2012   Fall as cause of accidental injury at home as place of occurrence 09/08/2018   Gastroenteritis 01/13/2021   GERD (gastroesophageal reflux disease) 11/13/2015   Hallucination, visual 09/14/2017   Head injury 09/08/2018   Hyperlipidemia    Hyperparathyroidism (HCC)    Hyperparathyroidism, 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   [facility] Irena Cords, MD  Plan:  25 Hydroxy Vit D (59.1 ng/ml)    Hypothyroidism    Moderate episode of recurrent major depressive disorder (HCC) 08/31/2016   PONV (postoperative nausea and vomiting)    Seizures (HCC)    3 years ago,2018   Sepsis (HCC) 04/29/2020   Sleep apnea    cpap   Syncope 08/30/2015   Syncope and collapse 02/12/2012  Tachycardia 02/04/2012   Trochanteric bursitis of both hips 2012   Confirmed on MRI    MEDICATIONS: Current Outpatient Medications on File Prior to Visit  Medication Sig Dispense Refill   albuterol (VENTOLIN HFA) 108 (90 Base) MCG/ACT inhaler TAKE 2 PUFFS BY MOUTH EVERY 6 HOURS AS NEEDED FOR WHEEZE OR SHORTNESS OF BREATH 18 g 6   ALPRAZolam (XANAX) 0.5 MG tablet MAY TAKE ONE TABLET UP TO 2 TIMES A DAY FOR ANXIETY. AVOID TAKING DAILY, IF  POSSIBLE. 30 tablet 2   AMBULATORY NON FORMULARY MEDICATION Continuous positive airway pressure (CPAP) machine set at 15 cm of H2O pressure, with all supplemental supplies as needed. 1 each 0   AMBULATORY NON FORMULARY MEDICATION Continuous positive airway pressure (CPAP) machine set at 15 cm of H2O pressure. Supplemental supplies: Headgear with q 6 months, Full face mask q 3 months, Full face cushion q 3 months, 1 standard tubing q 3 months, 1 water chamber q 6 months. 1 reusable filter q 6 months, 6 disposable filters q 3 months. Mask F-29. 1 each 0   aspirin EC 81 MG tablet Take 1 tablet (81 mg total) by mouth daily. 90 tablet 3   atorvastatin (LIPITOR) 20 MG tablet      blood glucose meter kit and supplies KIT Dispense based on patient and insurance preference. Use up to four times daily as directed. Check blood sugar three times weekly, then as needed. 1 each 0   buPROPion (WELLBUTRIN XL) 150 MG 24 hr tablet TAKE 1 TABLET BY MOUTH EVERY DAY 90 tablet 3   busPIRone (BUSPAR) 7.5 MG tablet TAKE 2 TABLETS (15 MG TOTAL) BY MOUTH 3 (THREE) TIMES DAILY. 540 tablet 3   cetirizine (ZYRTEC) 10 MG tablet TAKE 1 TABLET BY MOUTH EVERY DAY 90 tablet 1   diclofenac sodium (VOLTAREN) 1 % GEL Apply 2 g topically daily as needed (for knee pain). 100 g 3   DULoxetine (CYMBALTA) 60 MG capsule TAKE 1 CAPSULE BY MOUTH EVERY DAY 90 capsule 3   famotidine (PEPCID) 20 MG tablet TAKE 1 TABLET BY MOUTH TWICE A DAY 180 tablet 1   fluticasone (FLONASE) 50 MCG/ACT nasal spray PLACE 2 SPRAYS AT BEDTIME INTO BOTH NOSTRILS. 16 g 5   gabapentin (NEURONTIN) 600 MG tablet TAKE 1 TABLET (600 MG TOTAL) BY MOUTH EVERY MORNING AND 1.5 TABLETS (900 MG TOTAL) AT BEDTIME. 225 tablet 3   hydrOXYzine (VISTARIL) 50 MG capsule Take 1-2 tablets by mouth at bedtime for sleep and anxiety. Start with the lowest dose. May take 1 tablet up to 2 times during the day for acute anxiety symptoms only if needed. 360 capsule 2   levothyroxine (SYNTHROID)  50 MCG tablet TAKE 1 TABLET (50 MCG TOTAL) BY MOUTH EVERY OTHER DAY. BEFORE BREAKFAST. ALTERNATE WITH TABLET 90 tablet 1   levothyroxine (SYNTHROID) 75 MCG tablet TAKE 1 TABLET (75 MCG TOTAL) BY MOUTH EVERY OTHER DAY. BEFORE BREAKFAST. ALTERNATE WITH TABLET 90 tablet 1   meclizine (ANTIVERT) 50 MG tablet Take 0.5 tablets (25 mg total) by mouth 2 (two) times daily as needed for dizziness. (Patient taking differently: Take 50 mg by mouth 2 (two) times daily as needed for dizziness.) 60 tablet 3   metFORMIN (GLUCOPHAGE) 500 MG tablet TAKE 1 TABLET BY MOUTH TWICE A DAY WITH FOOD 180 tablet 1   metoCLOPramide (REGLAN) 10 MG tablet TAKE 0.5 TABLET BY MOUTH 3 (THREE) TIMES DAILY AS NEEDED FOR NAUSEA OR VOMITING (NAUSEA/REFLUX). 30 tablet 1  Multiple Vitamins-Minerals (MULTIVITAMIN WITH MINERALS) tablet Take 1 tablet by mouth daily.     ondansetron (ZOFRAN-ODT) 8 MG disintegrating tablet Take 1 tablet (8 mg total) by mouth every 8 (eight) hours as needed for nausea or vomiting. 20 tablet 0   pantoprazole (PROTONIX) 40 MG tablet Take 1 tablet (40 mg total) by mouth 2 (two) times daily. 180 tablet 1   Polyvinyl Alcohol-Povidone PF (REFRESH) 1.4-0.6 % SOLN Place 1 drop into both eyes daily as needed (dry eyes).     Probiotic Product (ALIGN) 4 MG CAPS Take 1 capsule (4 mg total) by mouth daily. 30 capsule 0   promethazine (PHENERGAN) 12.5 MG tablet Take 1 tablet (12.5 mg total) by mouth every 8 (eight) hours as needed for nausea or vomiting. 20 tablet 0   promethazine (PHENERGAN) 25 MG suppository Place 1 suppository (25 mg total) rectally every 6 (six) hours as needed for nausea or vomiting. 12 each 3   QUEtiapine (SEROQUEL) 300 MG tablet TAKE 1 TABLET BY MOUTH EVERYDAY AT BEDTIME 90 tablet 3   Rimegepant Sulfate (NURTEC) 75 MG TBDP Take 75 mg by mouth daily as needed. 8 tablet 5   rizatriptan (MAXALT-MLT) 10 MG disintegrating tablet TAKE 1 TABLET BY MOUTH AS NEEDED FOR MIGRAINE. MAY REPEAT IN 2  HOURS IF NEEDED 10 tablet 5   trolamine salicylate (ASPERCREME) 10 % cream Apply 1 application topically as needed for muscle pain.     valACYclovir (VALTREX) 500 MG tablet Take 1 tablet (500 mg total) by mouth daily. 90 tablet 3   zonisamide (ZONEGRAN) 100 MG capsule TAKE 1 CAPSULE BY MOUTH EVERY DAY 30 capsule 0   No current facility-administered medications on file prior to visit.    ALLERGIES: Allergies  Allergen Reactions   Tramadol Nausea And Vomiting   Trazodone And Nefazodone Other (See Comments)    Per pt trazodone caused hallucinations and behavior changes     FAMILY HISTORY: Family History  Problem Relation Age of Onset   Diabetes Mother    Heart disease Mother    Kidney disease Mother    Thyroid disease Mother    Heart disease Father    Seizures Father    COPD Father    Irritable bowel syndrome Father    Cancer Maternal Grandmother    Diabetes Sister    Diabetes Brother    Colon cancer Neg Hx    Stomach cancer Neg Hx    Colon polyps Neg Hx    Esophageal cancer Neg Hx    Rectal cancer Neg Hx       Objective:  *** General: No acute distress.  Patient appears well-groomed.   ***    Jennifer Millet, DO  CC: Enid Skeens, NP

## 2023-08-23 NOTE — Patient Instructions (Addendum)
 Order- DME [facility]- please refill mask of choice, supplies, filters/ hoses. Continue auto 5-20, airView

## 2023-08-24 ENCOUNTER — Other Ambulatory Visit: Payer: Self-pay | Admitting: Neurology

## 2023-08-24 ENCOUNTER — Ambulatory Visit (INDEPENDENT_AMBULATORY_CARE_PROVIDER_SITE_OTHER): Payer: Medicare Other | Admitting: Neurology

## 2023-08-24 ENCOUNTER — Encounter: Payer: Self-pay | Admitting: Neurology

## 2023-08-24 ENCOUNTER — Telehealth: Payer: Self-pay

## 2023-08-24 ENCOUNTER — Other Ambulatory Visit (HOSPITAL_COMMUNITY): Payer: Self-pay

## 2023-08-24 ENCOUNTER — Telehealth: Payer: Self-pay | Admitting: Pharmacy Technician

## 2023-08-24 VITALS — BP 123/70 | HR 119 | Ht 62.0 in | Wt 174.0 lb

## 2023-08-24 DIAGNOSIS — G43009 Migraine without aura, not intractable, without status migrainosus: Secondary | ICD-10-CM

## 2023-08-24 DIAGNOSIS — R569 Unspecified convulsions: Secondary | ICD-10-CM | POA: Diagnosis not present

## 2023-08-24 MED ORDER — NURTEC 75 MG PO TBDP: 1.0000 | ORAL_TABLET | Freq: Every day | ORAL | 5 refills | Status: DC | PRN

## 2023-08-24 NOTE — Telephone Encounter (Signed)
 PA has been submitted, and telephone encounter has been created. Please see telephone encounter dated 4.9.25.

## 2023-08-24 NOTE — Telephone Encounter (Signed)
 Pharmacy Patient Advocate Encounter   Received notification from CoverMyMeds that prior authorization for NURTEC 75MG  is required/requested.   Insurance verification completed.   The patient is insured through [facility] .   Per test claim: PA required; PA submitted to above mentioned insurance via CoverMyMeds Key/confirmation #/EOC [id number] Status is pending

## 2023-08-24 NOTE — Patient Instructions (Addendum)
 Will try to get Nurtec approved. Zonisamide 100mg  daily 24 hour ambulatory EEG  law stating no driving for 6 months after suspected seizure Follow up 6 months.

## 2023-08-24 NOTE — Telephone Encounter (Signed)
 Patient seen in office today, Per Dr.Jaffe try to re do the PA for Nurtec or exception form for it.

## 2023-08-26 ENCOUNTER — Other Ambulatory Visit (HOSPITAL_COMMUNITY): Payer: Self-pay

## 2023-08-26 NOTE — Telephone Encounter (Signed)
 Pharmacy Patient Advocate Encounter  Received notification from [facility] that Prior Authorization for NURTEC 75MG  has been APPROVED from 1.1.25 to 4.9.26. Ran test claim, Copay is $224.07. This test claim was processed through [facility]- copay amounts may vary at other pharmacies due to pharmacy/plan contracts, or as the patient moves through the different stages of their insurance plan.   PA #/Case ID/Reference #: [id number]

## 2023-09-07 ENCOUNTER — Other Ambulatory Visit: Payer: Self-pay | Admitting: Nurse Practitioner

## 2023-09-07 DIAGNOSIS — R062 Wheezing: Secondary | ICD-10-CM

## 2023-09-07 DIAGNOSIS — F4310 Post-traumatic stress disorder, unspecified: Secondary | ICD-10-CM

## 2023-09-07 DIAGNOSIS — F339 Major depressive disorder, recurrent, unspecified: Secondary | ICD-10-CM

## 2023-09-07 DIAGNOSIS — F411 Generalized anxiety disorder: Secondary | ICD-10-CM

## 2023-09-07 NOTE — Telephone Encounter (Unsigned)
 Copied from CRM [id number]. Topic: Clinical - Medication Refill >> Sep 07, 2023  1:49 PM Rosaria Common wrote: Most Recent Primary Care Visit:  Provider: ALLEN, NICKEAH E  Department: [facility]  Visit Type: MEDICARE AWV, SEQUENTIAL  Date: 07/26/2023  Medication: hydrOXYzine  (VISTARIL ) 50 MG capsule  Has the patient contacted their pharmacy? Yes (Agent: If no, request that the patient contact the pharmacy for the refill. If patient does not wish to contact the pharmacy document the reason why and proceed with request.) (Agent: If yes, when and what did the pharmacy advise?)  Is this the correct pharmacy for this prescription? Yes If no, delete pharmacy and type the correct one.  This is the patient's preferred pharmacy:  [id number] [place] [place] - [address] [facility] [address] [place] [address] Phone: [phone] Fax: [phone]   Has the prescription been filled recently? Yes  Is the patient out of the medication? No.   Has the patient been seen for an appointment in the last year OR does the patient have an upcoming appointment? Yes  Can we respond through MyChart? Yes  Agent: Please be advised that Rx refills may take up to 3 business days. We ask that you follow-up with your pharmacy.

## 2023-09-08 MED ORDER — HYDROXYZINE PAMOATE 50 MG PO CAPS
ORAL_CAPSULE | ORAL | 2 refills | Status: DC
Start: 2023-09-08 — End: 2023-09-20

## 2023-09-09 ENCOUNTER — Other Ambulatory Visit: Payer: Self-pay | Admitting: Nurse Practitioner

## 2023-09-09 DIAGNOSIS — T7840XD Allergy, unspecified, subsequent encounter: Secondary | ICD-10-CM

## 2023-09-11 ENCOUNTER — Other Ambulatory Visit: Payer: Self-pay | Admitting: Nurse Practitioner

## 2023-09-11 DIAGNOSIS — F39 Unspecified mood [affective] disorder: Secondary | ICD-10-CM

## 2023-09-12 NOTE — Telephone Encounter (Signed)
 Last apt 06/20/23

## 2023-09-20 ENCOUNTER — Other Ambulatory Visit (HOSPITAL_COMMUNITY): Payer: Self-pay

## 2023-09-20 ENCOUNTER — Telehealth: Payer: Self-pay

## 2023-09-20 ENCOUNTER — Encounter: Payer: Self-pay | Admitting: Nurse Practitioner

## 2023-09-20 ENCOUNTER — Other Ambulatory Visit: Payer: Self-pay | Admitting: Neurology

## 2023-09-20 ENCOUNTER — Ambulatory Visit: Payer: Medicare Other | Admitting: Nurse Practitioner

## 2023-09-20 VITALS — BP 122/78 | HR 95 | Wt 177.4 lb

## 2023-09-20 DIAGNOSIS — F5101 Primary insomnia: Secondary | ICD-10-CM | POA: Diagnosis not present

## 2023-09-20 DIAGNOSIS — D72828 Other elevated white blood cell count: Secondary | ICD-10-CM

## 2023-09-20 DIAGNOSIS — N182 Chronic kidney disease, stage 2 (mild): Secondary | ICD-10-CM

## 2023-09-20 DIAGNOSIS — F411 Generalized anxiety disorder: Secondary | ICD-10-CM | POA: Diagnosis not present

## 2023-09-20 DIAGNOSIS — M7989 Other specified soft tissue disorders: Secondary | ICD-10-CM

## 2023-09-20 DIAGNOSIS — F39 Unspecified mood [affective] disorder: Secondary | ICD-10-CM

## 2023-09-20 DIAGNOSIS — F339 Major depressive disorder, recurrent, unspecified: Secondary | ICD-10-CM

## 2023-09-20 DIAGNOSIS — T753XXA Motion sickness, initial encounter: Secondary | ICD-10-CM | POA: Insufficient documentation

## 2023-09-20 DIAGNOSIS — F4310 Post-traumatic stress disorder, unspecified: Secondary | ICD-10-CM

## 2023-09-20 DIAGNOSIS — T7840XD Allergy, unspecified, subsequent encounter: Secondary | ICD-10-CM

## 2023-09-20 DIAGNOSIS — D509 Iron deficiency anemia, unspecified: Secondary | ICD-10-CM

## 2023-09-20 DIAGNOSIS — K219 Gastro-esophageal reflux disease without esophagitis: Secondary | ICD-10-CM

## 2023-09-20 DIAGNOSIS — E038 Other specified hypothyroidism: Secondary | ICD-10-CM

## 2023-09-20 DIAGNOSIS — G43719 Chronic migraine without aura, intractable, without status migrainosus: Secondary | ICD-10-CM

## 2023-09-20 DIAGNOSIS — E875 Hyperkalemia: Secondary | ICD-10-CM

## 2023-09-20 DIAGNOSIS — R7303 Prediabetes: Secondary | ICD-10-CM

## 2023-09-20 HISTORY — DX: Motion sickness, initial encounter: T75.3XXA

## 2023-09-20 HISTORY — DX: Other specified soft tissue disorders: M79.89

## 2023-09-20 MED ORDER — METFORMIN HCL 500 MG PO TABS: 500.0000 mg | ORAL_TABLET | Freq: Two times a day (BID) | ORAL | 1 refills | Status: DC

## 2023-09-20 MED ORDER — CETIRIZINE HCL 10 MG PO TABS: 10.0000 mg | ORAL_TABLET | Freq: Every day | ORAL | 1 refills | Status: DC

## 2023-09-20 MED ORDER — PANTOPRAZOLE SODIUM 40 MG PO TBEC: 40.0000 mg | DELAYED_RELEASE_TABLET | Freq: Two times a day (BID) | ORAL | 1 refills | Status: DC

## 2023-09-20 MED ORDER — ZONISAMIDE 100 MG PO CAPS
100.0000 mg | ORAL_CAPSULE | Freq: Every day | ORAL | 1 refills | Status: DC
Start: 2023-09-20 — End: 2024-02-09

## 2023-09-20 MED ORDER — HYDROXYZINE PAMOATE 50 MG PO CAPS: ORAL_CAPSULE | ORAL | 3 refills | Status: DC

## 2023-09-20 MED ORDER — ALPRAZOLAM 0.5 MG PO TABS: ORAL_TABLET | ORAL | 3 refills | Status: DC

## 2023-09-20 MED ORDER — SCOPOLAMINE 1 MG/3DAYS TD PT72
MEDICATED_PATCH | TRANSDERMAL | 1 refills | Status: DC
Start: 2023-09-20 — End: 2024-03-06

## 2023-09-20 MED ORDER — FUROSEMIDE 20 MG PO TABS: ORAL_TABLET | ORAL | 3 refills | Status: DC

## 2023-09-20 NOTE — Telephone Encounter (Signed)
 Pharmacy Patient Advocate Encounter   Received notification from CoverMyMeds that prior authorization for Scopolamine 1MG /3DAYS 72 hr patches is required/requested.   Insurance verification completed.   The patient is insured through [facility] .   Per test claim: PA required; PA submitted to above mentioned insurance via CoverMyMeds Key/confirmation #/EOC (Key: [id number])    Status is pending

## 2023-09-20 NOTE — Progress Notes (Unsigned)
 Dell Fennel, DNP, AGNP-c [facility]  [address] [place], [place] [address] [phone]  ESTABLISHED PATIENT- Chronic Health and/or Follow-Up Visit  Blood pressure 122/78, pulse 95, weight 177 lb 6.4 oz (80.5 kg).    Jennifer Davidson is a 68 y.o. year old female presenting today for evaluation and management of chronic conditions.   History of Present Illness Jennifer Davidson is a 68 year old female with insomnia and arthritis who presents with difficulty sleeping and leg swelling.  She experiences chronic insomnia, typically falling asleep at 3 or 4 AM and waking up around 6 AM. She denies any specific worries causing this issue. Her husband notices puffiness around her eyes, which she attributes to lack of sleep. She previously took hydroxyzine  four times a day, which helped her feel relaxed, but it was reduced to one or two times a day. Trazodone  is not an option due to adverse reactions including hallucinations.  She experiences leg swelling, particularly in one leg, which becomes tight. The swelling is more pronounced when she sits or stands for long periods. She uses compression stockings and elevates her leg to reduce the swelling. She has a history of easy bruising, noting that even minor impacts can cause bruising. No varicose veins are noted. She reports shortness of breath sometimes.  She has a history of arthritis, with a previous test indicating severe arthritis along her spine. She uses Tylenol  and diclofenac  gel for pain management. Her nausea has improved recently.  She is currently taking zonisamide  100 mg once a day for headache prevention. She also alternates between 50 mg and 75 mg of Synthroid . She has experienced leg cramps, which she attributes to increased physical activity, such as walking and working in the yard.  She is planning a cruise for her birthday in September and requests patches to prevent motion sickness, as she experienced severe  sickness on a previous cruise.  All ROS negative with exception of what is listed above.   PHYSICAL EXAM Physical Exam Vitals and nursing note reviewed.  Constitutional:      General: She is not in acute distress.    Appearance: Normal appearance. She is not ill-appearing.  HENT:     Head: Normocephalic.  Eyes:     Conjunctiva/sclera: Conjunctivae normal.     Pupils: Pupils are equal, round, and reactive to light.  Neck:     Vascular: No carotid bruit.  Cardiovascular:     Rate and Rhythm: Normal rate and regular rhythm.     Pulses: Normal pulses.     Heart sounds: Normal heart sounds.  Pulmonary:     Effort: Pulmonary effort is normal.     Breath sounds: Normal breath sounds.  Abdominal:     General: Bowel sounds are normal.  Musculoskeletal:     Right lower leg: Edema present.     Left lower leg: No edema.  Lymphadenopathy:     Cervical: No cervical adenopathy.  Skin:    General: Skin is warm and dry.     Capillary Refill: Capillary refill takes less than 2 seconds.  Neurological:     General: No focal deficit present.     Mental Status: She is alert and oriented to person, place, and time.  Psychiatric:        Behavior: Behavior normal.      PLAN Problem List Items Addressed This Visit     Chronic kidney disease (Chronic)   Will monitor labs. No concerning symptoms. Encourage hydration.  Relevant Orders   CMP14+EGFR (Completed)   Hypothyroid (Chronic)   Stable on current regimen. Labs pending.       Mood disorder (HCC) (Chronic)   Good control at this time with no concerns present. Will continue current regimen. Increase hydroxyzine  as this was previously helpful for sleep.       Relevant Medications   ALPRAZolam  (XANAX ) 0.5 MG tablet   Headache, migraine   Medication refills. No alarm symptoms.       Relevant Medications   furosemide  (LASIX ) 20 MG tablet   zonisamide  (ZONEGRAN ) 100 MG capsule   Gastroesophageal reflux disease   Refills  provided. No concerns at this time.       Relevant Medications   scopolamine (TRANSDERM-SCOP) 1 MG/3DAYS   pantoprazole  (PROTONIX ) 40 MG tablet   PTSD (post-traumatic stress disorder)   See mood disorder. No changes.       Relevant Medications   hydrOXYzine  (VISTARIL ) 50 MG capsule   ALPRAZolam  (XANAX ) 0.5 MG tablet   Prediabetes   Diet and exercise management recommended to aid in lowering carbohydrate intake       Relevant Medications   metFORMIN  (GLUCOPHAGE ) 500 MG tablet   Allergies   Refills provided. No concern      Relevant Medications   cetirizine  (ZYRTEC ) 10 MG tablet   Primary insomnia   Chronic insomnia with difficulty falling asleep until Debanhi Blaker morning hours. Previous use of hydroxyzine  four times a day was reduced to one or two times a day. Trazodone  is not an option due to adverse reactions including hallucinations. Hydroxyzine  previously provided relaxation and improved sleep. - Increase hydroxyzine  to four times a day or two at bedtime to aid sleep      Leg swelling   Reports leg swelling, particularly in one leg, which becomes tight. Swelling reduces with elevation. Possible venous insufficiency due to valve incompetence in veins. No varicose veins noted. - Use compression stockings, especially when inactive or traveling - Consider Lasix  as needed for severe swelling      Relevant Medications   furosemide  (LASIX ) 20 MG tablet   Motion sickness   Discussion of upcoming cruise and need for motion sickness prevention. Encouraged to wear compression stockings during travel to prevent blood clots. - Prescribe motion sickness patches for upcoming cruise - Advise wearing compression stockings during travel      Relevant Medications   scopolamine (TRANSDERM-SCOP) 1 MG/3DAYS   Other Visit Diagnoses       Major depression, recurrent, chronic (HCC)  (Chronic)   -  Primary   Relevant Medications   hydrOXYzine  (VISTARIL ) 50 MG capsule   ALPRAZolam  (XANAX ) 0.5  MG tablet     Generalized anxiety disorder       Relevant Medications   hydrOXYzine  (VISTARIL ) 50 MG capsule   ALPRAZolam  (XANAX ) 0.5 MG tablet     Other elevated white blood cell (WBC) count       Relevant Orders   CBC with Differential/Platelet (Completed)     Serum potassium elevated       Relevant Orders   CMP14+EGFR (Completed)     Iron deficiency anemia, unspecified iron deficiency anemia type       Relevant Orders   Iron, TIBC and Ferritin Panel (Completed)       Return in about 6 months (around 03/22/2024) for CPE.  Dell Fennel, DNP, AGNP-c

## 2023-09-21 ENCOUNTER — Encounter: Payer: Self-pay | Admitting: Nurse Practitioner

## 2023-09-21 ENCOUNTER — Other Ambulatory Visit (HOSPITAL_COMMUNITY): Payer: Self-pay

## 2023-09-21 LAB — CBC WITH DIFFERENTIAL/PLATELET
Basophils Absolute: 0.1 10*3/uL (ref 0.0–0.2)
Basos: 1 %
EOS (ABSOLUTE): 0.2 10*3/uL (ref 0.0–0.4)
Eos: 3 %
Hematocrit: 37.9 % (ref 34.0–46.6)
Hemoglobin: 12.7 g/dL (ref 11.1–15.9)
Immature Grans (Abs): 0.2 10*3/uL — ABNORMAL HIGH (ref 0.0–0.1)
Immature Granulocytes: 2 %
Lymphocytes Absolute: 2.7 10*3/uL (ref 0.7–3.1)
Lymphs: 27 %
MCH: 30.2 pg (ref 26.6–33.0)
MCHC: 33.5 g/dL (ref 31.5–35.7)
MCV: 90 fL (ref 79–97)
Monocytes Absolute: 0.7 10*3/uL (ref 0.1–0.9)
Monocytes: 7 %
Neutrophils Absolute: 5.9 10*3/uL (ref 1.4–7.0)
Neutrophils: 60 %
Platelets: 366 10*3/uL (ref 150–450)
RBC: 4.2 x10E6/uL (ref 3.77–5.28)
RDW: 14.1 % (ref 11.7–15.4)
WBC: 9.8 10*3/uL (ref 3.4–10.8)

## 2023-09-21 LAB — CMP14+EGFR
ALT: 12 IU/L (ref 0–32)
AST: 16 IU/L (ref 0–40)
Albumin: 4.6 g/dL (ref 3.9–4.9)
Alkaline Phosphatase: 80 IU/L (ref 44–121)
BUN/Creatinine Ratio: 11 — ABNORMAL LOW (ref 12–28)
BUN: 17 mg/dL (ref 8–27)
CO2: 19 mmol/L — ABNORMAL LOW (ref 20–29)
Calcium: 9.8 mg/dL (ref 8.7–10.3)
Chloride: 100 mmol/L (ref 96–106)
Creatinine, Ser: 1.52 mg/dL — ABNORMAL HIGH (ref 0.57–1.00)
Globulin, Total: 2.1 g/dL (ref 1.5–4.5)
Glucose: 75 mg/dL (ref 70–99)
Potassium: 4.9 mmol/L (ref 3.5–5.2)
Sodium: 136 mmol/L (ref 134–144)
Total Protein: 6.7 g/dL (ref 6.0–8.5)
eGFR: 37 mL/min/{1.73_m2} — ABNORMAL LOW (ref >59)

## 2023-09-21 LAB — IRON,TIBC AND FERRITIN PANEL
Ferritin: 46 ng/mL (ref 15–150)
Iron Saturation: 21 % (ref 15–55)
Iron: 54 ug/dL (ref 27–139)
Total Iron Binding Capacity: 259 ug/dL (ref 250–450)
UIBC: 205 ug/dL (ref 118–369)

## 2023-09-21 NOTE — Assessment & Plan Note (Signed)
 Refills provided. No concerns at this time.

## 2023-09-21 NOTE — Assessment & Plan Note (Signed)
 Refills provided. No concern

## 2023-09-21 NOTE — Assessment & Plan Note (Signed)
 See mood disorder. No changes.

## 2023-09-21 NOTE — Assessment & Plan Note (Signed)
 Diet and exercise management recommended to aid in lowering carbohydrate intake

## 2023-09-21 NOTE — Assessment & Plan Note (Signed)
 Good control at this time with no concerns present. Will continue current regimen. Increase hydroxyzine  as this was previously helpful for sleep.

## 2023-09-21 NOTE — Assessment & Plan Note (Signed)
 Discussion of upcoming cruise and need for motion sickness prevention. Encouraged to wear compression stockings during travel to prevent blood clots. - Prescribe motion sickness patches for upcoming cruise - Advise wearing compression stockings during travel

## 2023-09-21 NOTE — Assessment & Plan Note (Signed)
 Reports leg swelling, particularly in one leg, which becomes tight. Swelling reduces with elevation. Possible venous insufficiency due to valve incompetence in veins. No varicose veins noted. - Use compression stockings, especially when inactive or traveling - Consider Lasix  as needed for severe swelling

## 2023-09-21 NOTE — Assessment & Plan Note (Signed)
 Chronic insomnia with difficulty falling asleep until Jennifer Davidson morning hours. Previous use of hydroxyzine  four times a day was reduced to one or two times a day. Trazodone  is not an option due to adverse reactions including hallucinations. Hydroxyzine  previously provided relaxation and improved sleep. - Increase hydroxyzine  to four times a day or two at bedtime to aid sleep

## 2023-09-21 NOTE — Assessment & Plan Note (Signed)
 Will monitor labs. No concerning symptoms. Encourage hydration.

## 2023-09-21 NOTE — Assessment & Plan Note (Signed)
>>  ASSESSMENT AND PLAN FOR CHRONIC KIDNEY DISEASE WRITTEN ON 09/21/2023  5:41 PM BY Makailah Slavick E, NP  Will monitor labs. No concerning symptoms. Encourage hydration.

## 2023-09-21 NOTE — Assessment & Plan Note (Signed)
 Stable on current regimen. Labs pending.

## 2023-09-21 NOTE — Assessment & Plan Note (Signed)
 Medication refills. No alarm symptoms.

## 2023-09-21 NOTE — Telephone Encounter (Signed)
 Pharmacy Patient Advocate Encounter  Received notification from [facility] that Prior Authorization for Scopolamine 1MG /3DAYS 72 hr patches has been APPROVED from 1.1.25 to 5.6.26. Ran test claim, Copay is $RTS, RX WAS LAST FILLED ON 09/20/23. This test claim was processed through [facility]- copay amounts may vary at other pharmacies due to pharmacy/plan contracts, or as the patient moves through the different stages of their insurance plan.   PA #/Case ID/Reference #: (Key: [id number])

## 2023-09-27 ENCOUNTER — Ambulatory Visit (INDEPENDENT_AMBULATORY_CARE_PROVIDER_SITE_OTHER): Admitting: Neurology

## 2023-09-27 DIAGNOSIS — R569 Unspecified convulsions: Secondary | ICD-10-CM

## 2023-09-27 NOTE — Progress Notes (Signed)
 Ambulatory EEG hooked up and running. Light flashing. Push button tested. Camera and event log explained. Batteries explained. Patient understood.

## 2023-09-29 ENCOUNTER — Other Ambulatory Visit: Payer: Medicare Other

## 2023-09-30 NOTE — Progress Notes (Signed)
 AMB EEG discontinued.  Skin Breakdown:Yes slight redness at fz and cz - pt advised it should scab over but if needed she can use neosporin Diary Returned: No

## 2023-10-03 NOTE — Progress Notes (Signed)
 ELECTROENCEPHALOGRAM REPORT  Dates of Recording: 09/27/2023 at 13:51 to 09/28/2023 at 20:42  Patient's Name: Jennifer Davidson MRN: [id number] Date of Birth: 28-Jun-1955  Procedure: 30-hour ambulatory EEG  History: 68 year old female with migraine and history of psychogenic nonepileptic seizures of left sided twitching now presents with episodes of right-sided twitching  Medications: alprazolam , Wellbutrin , Zyrtec , duloxetine , gabapentin , hydroxyzine , Synthroid , Seroquel , zonisamide   Technical Summary: This is a 30-hour multichannel digital EEG recording measured by the international 10-20 system with electrodes applied with paste and impedances below 5000 ohms performed as portable with EKG monitoring.  The digital EEG was referentially recorded, reformatted, and digitally filtered in a variety of bipolar and referential montages for optimal display.    DESCRIPTION OF RECORDING: During maximal wakefulness, the background activity consisted of a symmetric 10Hz  posterior dominant rhythm which was reactive to eye opening.  There were no epileptiform discharges or focal slowing seen in wakefulness.  During the recording, the patient progresses through wakefulness, drowsiness, and Stage 2 sleep.  Again, there were no epileptiform discharges seen.  Events: Patient did not record at specific times when she had events, but she did report that she exhibited her habitual spells of right sided twitching during the study.  There were no electrographic seizures seen.  EKG lead was unremarkable.  IMPRESSION: This 30-hour ambulatory EEG study is normal.    CLINICAL CORRELATION: The above findings indicate that the patient's spells are non-epileptic in etiology.   Janne Members, DO

## 2023-10-05 ENCOUNTER — Telehealth: Payer: Self-pay

## 2023-10-05 NOTE — Telephone Encounter (Signed)
 Per Dr.Jaffe, can we contact patient to ask if she had the episodes of right sided shaking while wearing the EEG?    LMOVM for patient to call the office back.Mychart message sent as well.

## 2023-10-12 ENCOUNTER — Ambulatory Visit: Payer: Self-pay | Admitting: Nurse Practitioner

## 2023-10-13 LAB — LAB REPORT - SCANNED
Creatinine, POC: 101.5 mg/dL
EGFR: 37

## 2023-10-14 ENCOUNTER — Encounter: Payer: Self-pay | Admitting: Internal Medicine

## 2023-10-25 LAB — CMP14+EGFR
ALT: 11 IU/L (ref 0–32)
AST: 17 IU/L (ref 0–40)
Albumin: 4 g/dL (ref 3.9–4.9)
Alkaline Phosphatase: 75 IU/L (ref 44–121)
BUN/Creatinine Ratio: 11 — ABNORMAL LOW (ref 12–28)
BUN: 14 mg/dL (ref 8–27)
Bilirubin Total: 0.3 mg/dL (ref 0.0–1.2)
CO2: 21 mmol/L (ref 20–29)
Calcium: 9.3 mg/dL (ref 8.7–10.3)
Chloride: 104 mmol/L (ref 96–106)
Creatinine, Ser: 1.31 mg/dL — ABNORMAL HIGH (ref 0.57–1.00)
Globulin, Total: 2.2 g/dL (ref 1.5–4.5)
Glucose: 102 mg/dL — ABNORMAL HIGH (ref 70–99)
Potassium: 5.3 mmol/L — ABNORMAL HIGH (ref 3.5–5.2)
Sodium: 138 mmol/L (ref 134–144)
Total Protein: 6.2 g/dL (ref 6.0–8.5)
eGFR: 44 mL/min/{1.73_m2} — ABNORMAL LOW (ref >59)

## 2023-10-25 LAB — CBC WITH DIFFERENTIAL/PLATELET
Basophils Absolute: 0.1 10*3/uL (ref 0.0–0.2)
Basos: 1 %
EOS (ABSOLUTE): 0.2 10*3/uL (ref 0.0–0.4)
Eos: 4 %
Hematocrit: 33.9 % — ABNORMAL LOW (ref 34.0–46.6)
Hemoglobin: 11.1 g/dL (ref 11.1–15.9)
Immature Grans (Abs): 0.1 10*3/uL (ref 0.0–0.1)
Immature Granulocytes: 1 %
Lymphocytes Absolute: 1.7 10*3/uL (ref 0.7–3.1)
Lymphs: 28 %
MCH: 31.2 pg (ref 26.6–33.0)
MCHC: 32.7 g/dL (ref 31.5–35.7)
MCV: 95 fL (ref 79–97)
Monocytes Absolute: 0.5 10*3/uL (ref 0.1–0.9)
Monocytes: 8 %
Neutrophils Absolute: 3.6 10*3/uL (ref 1.4–7.0)
Neutrophils: 58 %
Platelets: 320 10*3/uL (ref 150–450)
RBC: 3.56 x10E6/uL — ABNORMAL LOW (ref 3.77–5.28)
RDW: 14.8 % (ref 11.7–15.4)
WBC: 6.1 10*3/uL (ref 3.4–10.8)

## 2023-10-25 LAB — IRON,TIBC AND FERRITIN PANEL
Ferritin: 40 ng/mL (ref 15–150)
Iron Saturation: 12 % — ABNORMAL LOW (ref 15–55)
Iron: 31 ug/dL (ref 27–139)
Total Iron Binding Capacity: 269 ug/dL (ref 250–450)
UIBC: 238 ug/dL (ref 118–369)

## 2023-11-05 ENCOUNTER — Other Ambulatory Visit: Payer: Self-pay | Admitting: Nurse Practitioner

## 2023-11-05 DIAGNOSIS — M7989 Other specified soft tissue disorders: Secondary | ICD-10-CM

## 2023-11-21 ENCOUNTER — Other Ambulatory Visit: Payer: Self-pay | Admitting: Nurse Practitioner

## 2023-11-21 DIAGNOSIS — Z1231 Encounter for screening mammogram for malignant neoplasm of breast: Secondary | ICD-10-CM

## 2023-12-16 ENCOUNTER — Other Ambulatory Visit: Payer: Self-pay | Admitting: Nurse Practitioner

## 2023-12-16 DIAGNOSIS — T7840XD Allergy, unspecified, subsequent encounter: Secondary | ICD-10-CM

## 2024-01-05 ENCOUNTER — Ambulatory Visit
Admission: RE | Admit: 2024-01-05 | Discharge: 2024-01-05 | Disposition: A | Discharge status: Home / Self Care | Source: Ambulatory Visit | Attending: Nurse Practitioner

## 2024-01-05 DIAGNOSIS — Z1231 Encounter for screening mammogram for malignant neoplasm of breast: Secondary | ICD-10-CM

## 2024-01-11 ENCOUNTER — Ambulatory Visit: Payer: Self-pay | Admitting: Nurse Practitioner

## 2024-02-05 ENCOUNTER — Other Ambulatory Visit: Payer: Self-pay | Admitting: Neurology

## 2024-02-05 DIAGNOSIS — G43709 Chronic migraine without aura, not intractable, without status migrainosus: Secondary | ICD-10-CM

## 2024-02-09 ENCOUNTER — Other Ambulatory Visit: Payer: Self-pay | Admitting: Nurse Practitioner

## 2024-02-09 DIAGNOSIS — G43719 Chronic migraine without aura, intractable, without status migrainosus: Secondary | ICD-10-CM

## 2024-02-15 ENCOUNTER — Other Ambulatory Visit: Payer: Self-pay | Admitting: Nurse Practitioner

## 2024-02-15 ENCOUNTER — Ambulatory Visit: Payer: Self-pay

## 2024-02-15 NOTE — Telephone Encounter (Signed)
 FYI Only or Action Required?: FYI only for provider.  Patient was last seen in primary care on 09/20/2023 by Early, Camie BRAVO, NP.  Called Nurse Triage reporting Urinary Frequency.  Symptoms began 2 days ago.  Interventions attempted: Nothing.  Symptoms are: gradually worsening.  Triage Disposition: See Physician Within 24 Hours  Patient/caregiver understands and will follow disposition?: No, refuses disposition Reason for Disposition  Age > 50 years  Answer Assessment - Initial Assessment Questions Tried to schedule patient soonest available in PCP office, patient stated she does not want to see anyone other than Camie, advised patient Camie is booked till December but another female provider has openings today. Patient stated  no I don't want to see anyone but Camie, I'll just muscle through it and then hung up.   1. SEVERITY: How bad is the pain?  (e.g., Scale 1-10; mild, moderate, or severe)     Bad, hurts so bad to pee  2. FREQUENCY: How many times have you had painful urination today?      Every time   3. PATTERN: Is pain present every time you urinate or just sometimes?      Yes, feeling pressure and does not feel like bladder is empties after urinating.  4. ONSET: When did the painful urination start?      2 days ago  5. FEVER: Do you have a fever? If Yes, ask: What is your temperature, how was it measured, and when did it start?     No  6. PAST UTI: Have you had a urine infection before? If Yes, ask: When was the last time? and What happened that time?      Yes, thinks its a yeast infection   7. CAUSE: What do you think is causing the painful urination?  (e.g., UTI, scratch, Herpes sore)     Yeast infection, requesting fluconazole .   8. OTHER SYMPTOMS: Do you have any other symptoms? (e.g., blood in urine, flank pain, genital sores, urgency, vaginal discharge)     Urgency  Protocols used: Urination Pain - [facility]  Copied from CRM #[id number].  Topic: Clinical - Red Word Triage >> Feb 15, 2024  1:50 PM Wess RAMAN wrote: Red Word that prompted transfer to Nurse Triage: Yeast infection, itching, burning, pain and would like medication called in to pharmacy  Medication: Fluconazole  150 MG  [facility]:  [id number] [place] [place] - [address] [facility] [address] [place] [address] Phone: [phone] Fax: [phone] Hours: Not open 24 hours

## 2024-02-16 ENCOUNTER — Ambulatory Visit (INDEPENDENT_AMBULATORY_CARE_PROVIDER_SITE_OTHER): Admitting: Nurse Practitioner

## 2024-02-16 ENCOUNTER — Encounter: Payer: Self-pay | Admitting: Nurse Practitioner

## 2024-02-16 VITALS — BP 138/92 | HR 92 | Wt 171.8 lb

## 2024-02-16 DIAGNOSIS — F39 Unspecified mood [affective] disorder: Secondary | ICD-10-CM

## 2024-02-16 DIAGNOSIS — R350 Frequency of micturition: Secondary | ICD-10-CM

## 2024-02-16 DIAGNOSIS — N3090 Cystitis, unspecified without hematuria: Secondary | ICD-10-CM | POA: Diagnosis not present

## 2024-02-16 DIAGNOSIS — H66001 Acute suppurative otitis media without spontaneous rupture of ear drum, right ear: Secondary | ICD-10-CM | POA: Diagnosis not present

## 2024-02-16 LAB — POCT URINALYSIS DIP (CLINITEK)
Bilirubin, UA: NEGATIVE
Blood, UA: NEGATIVE
Glucose, UA: NEGATIVE mg/dL
Ketones, POC UA: NEGATIVE mg/dL
Nitrite, UA: NEGATIVE
Spec Grav, UA: 1.01 (ref 1.010–1.025)
Urobilinogen, UA: 0.2 U/dL
pH, UA: 6 (ref 5.0–8.0)

## 2024-02-16 MED ORDER — AMOXICILLIN-POT CLAVULANATE 875-125 MG PO TABS: 1.0000 | ORAL_TABLET | Freq: Two times a day (BID) | ORAL | 0 refills | Status: DC

## 2024-02-16 MED ORDER — FLUCONAZOLE 150 MG PO TABS: ORAL_TABLET | ORAL | 2 refills | Status: DC

## 2024-02-16 MED ORDER — ALPRAZOLAM 0.5 MG PO TABS
ORAL_TABLET | ORAL | 3 refills | Status: DC
  Filled 2024-04-07: qty 30, 15d supply, fill #0
  Filled 2024-05-04: qty 30, 15d supply, fill #1

## 2024-02-16 NOTE — Progress Notes (Signed)
 Camie FORBES Doing, DNP, AGNP-c [facility] [address] [place], [place] [address] [phone]   ACUTE VISIT on 02/16/2024  Blood pressure (!) 138/92, pulse 92, weight 171 lb 12.8 oz (77.9 kg).  Subjective:  HPI History of Present Illness Jennifer Davidson is a 68 year old female who presents with symptoms of a urinary tract infection and recent earache.  She has been experiencing symptoms consistent with a urinary tract infection for the past four days, including feeling 'really cold' without confirmed fever. There is no pain or spasms in the bladder area and no lower back pain. She also mentions vaginal itching.  In addition to the urinary symptoms, she had a 'really bad earache' last week, lasting the entire week, and the ear still 'stings a little bit.'  She recently returned from a cruise to [place] and [place], which she enjoyed. Upon returning, she had dental work done on her four front teeth and has not been adjusting well to the dental changes.  She discusses issues with her nails splitting, particularly her big toe, which she attributes to a possible vitamin deficiency. She is considering taking a B vitamin supplement to address this issue.  She uses medication to help with anxiety, particularly in situations like dental visits, where she feels very nervous.    ROS negative except for what is listed in HPI. History, Medications, Surgery, SDOH, and Family History reviewed and updated as appropriate.  Objective:  Physical Exam Vitals and nursing note reviewed.  Constitutional:      General: She is not in acute distress.    Appearance: Normal appearance. She is not ill-appearing.  HENT:     Head: Normocephalic.     Ears:     Comments: TM erythematous and bulging on right. Left clear effusion present.     Mouth/Throat:     Mouth: Mucous membranes are moist.     Pharynx: Oropharynx is clear.  Eyes:     Pupils: Pupils are equal, round, and reactive to light.   Cardiovascular:     Rate and Rhythm: Normal rate and regular rhythm.     Pulses: Normal pulses.     Heart sounds: Normal heart sounds.  Pulmonary:     Effort: Pulmonary effort is normal.     Breath sounds: Normal breath sounds.  Abdominal:     General: Bowel sounds are normal. There is no distension.     Palpations: Abdomen is soft.     Tenderness: There is no abdominal tenderness. There is no right CVA tenderness, left CVA tenderness or guarding.  Musculoskeletal:     Right lower leg: No edema.     Left lower leg: No edema.  Skin:    General: Skin is warm and dry.     Capillary Refill: Capillary refill takes less than 2 seconds.  Neurological:     Mental Status: She is alert and oriented to person, place, and time.     Motor: No weakness.  Psychiatric:        Mood and Affect: Mood normal.         Assessment & Plan:   Problem List Items Addressed This Visit     Mood disorder (Chronic)   Experiences anxiety, particularly in stressful situations such as dental visits. Requests medication to help manage anxiety when necessary. - Prescribe medication for anxiety to be used as needed in stressful situations.      Relevant Medications   ALPRAZolam  (XANAX ) 0.5 MG tablet   Cystitis -  Primary   Symptoms present for four days with no fever or chills, but cold sensations. No bladder or lower back pain. Augmentin  chosen to address both UTI and ear infection. - Prescribe Augmentin  to treat UTI and ear infection. - Send urine culture to confirm diagnosis and adjust treatment if necessary.      Relevant Medications   fluconazole  (DIFLUCAN ) 150 MG tablet   amoxicillin -clavulanate (AUGMENTIN ) 875-125 MG tablet   Non-recurrent acute suppurative otitis media of right ear without spontaneous rupture of tympanic membrane   Right ear pain with stinging sensation and swollen lymph nodes. Augmentin  prescribed to address ear infection along with UTI. - Prescribe Augmentin  to treat ear  infection and UTI.      Relevant Medications   fluconazole  (DIFLUCAN ) 150 MG tablet   amoxicillin -clavulanate (AUGMENTIN ) 875-125 MG tablet   Other Visit Diagnoses       Frequent urination       Relevant Orders   POCT URINALYSIS DIP (CLINITEK) (Completed)   Urine Culture (Completed)        Camie FORBES Doing, DNP, AGNP-c

## 2024-02-20 ENCOUNTER — Encounter: Payer: Self-pay | Admitting: Nurse Practitioner

## 2024-02-20 NOTE — Assessment & Plan Note (Signed)
 Experiences anxiety, particularly in stressful situations such as dental visits. Requests medication to help manage anxiety when necessary. - Prescribe medication for anxiety to be used as needed in stressful situations.

## 2024-02-20 NOTE — Assessment & Plan Note (Signed)
 Right ear pain with stinging sensation and swollen lymph nodes. Augmentin  prescribed to address ear infection along with UTI. - Prescribe Augmentin  to treat ear infection and UTI.

## 2024-02-20 NOTE — Assessment & Plan Note (Signed)
 Symptoms present for four days with no fever or chills, but cold sensations. No bladder or lower back pain. Augmentin  chosen to address both UTI and ear infection. - Prescribe Augmentin  to treat UTI and ear infection. - Send urine culture to confirm diagnosis and adjust treatment if necessary.

## 2024-02-21 ENCOUNTER — Telehealth: Payer: Self-pay | Admitting: Neurology

## 2024-02-21 LAB — URINE CULTURE

## 2024-02-21 NOTE — Telephone Encounter (Signed)
 LMOVM    RX denied patient on 02/05/24 due last ov notes, due to cerebrovascular disease (as seen on brain MRI), would not use triptans.    Patient taking Nurtec. Wanted to see if the cost keeps her from taking the nurtec.

## 2024-02-21 NOTE — Telephone Encounter (Signed)
 Pt needs a refill on her 10mg  Rizatriptan  benzoate   [facility]

## 2024-02-22 ENCOUNTER — Ambulatory Visit: Payer: Self-pay | Admitting: Nurse Practitioner

## 2024-02-22 ENCOUNTER — Other Ambulatory Visit: Payer: Self-pay | Admitting: Neurology

## 2024-02-22 DIAGNOSIS — B961 Klebsiella pneumoniae [K. pneumoniae] as the cause of diseases classified elsewhere: Secondary | ICD-10-CM

## 2024-02-22 DIAGNOSIS — G43709 Chronic migraine without aura, not intractable, without status migrainosus: Secondary | ICD-10-CM

## 2024-02-22 MED ORDER — CEFPODOXIME PROXETIL 200 MG PO TABS: 200.0000 mg | ORAL_TABLET | Freq: Two times a day (BID) | ORAL | 0 refills | Status: DC

## 2024-03-05 NOTE — Progress Notes (Unsigned)
 NEUROLOGY FOLLOW UP OFFICE NOTE  [name] [id number]  Assessment/Plan:   Psychogenic nonepileptic seizures (both right sided and left sided jerking) Migraine without aura, without status migrainosus, not intractable     Migraine prevention:  zonisamide  100mg  daily Migraine rescue:  Nurtec PRN.  Due to cerebrovascular disease (as seen on brain MRI), would not use triptans. Limit use of pain relievers to no more than 9 days out of the month to prevent risk of rebound or medication-overuse headache. Lifestyle modification Keep headache diary Follow up 6 months.   Subjective:  Jennifer Davidson is a 68 year old right-handed female with CHF, hypothyroidism, depression, anxiety, conversion disorder, OSA who follows up for altered mental status and migraines.     UPDATE: Seizure-like activity/altered mental status: Patient had a 30 hour ambulatory EEG from 09/27/2023 to 09/28/2023 in which she didn't record specific times of events but did report having her habitual spells of right sided twitching during the study which was normal, indicating her spells are nonepileptic.      Migraines: Migraines have been controlled. Occurs once every 2 to 3 weeks.  Treats with Ubrelvy  (still has samples) 5-6 hours  Current NSAIDS/analgesics:  ASA 81mg  daily Current triptans:  none Current ergotamine:  none Current anti-emetic:  Zofran  ODT 8mg , promethazine  12.5mg  PO and 25mg  PR Current muscle relaxants:  none Current Antihypertensive medications:  prazosin  Current Antidepressant medications:  Cymbalta  60mg  daily, Wellbutrin  XL 150mg  Current Anticonvulsant medications:  zonisamide  100mg  QD; gabapentin  600mg  AM and 900mg  PM Current anti-CGRP:  none Current Vitamins/Herbal/Supplements:  none Current Antihistamines/Decongestants:  Flonase , Zyrtec  Other therapy:  none Hormone/birth control:  none Other medications:  Seroquel , Synthroid , Buspar    Caffeine :  No coffee.  1 Diet Dr. Nunzio a  day Diet:  Drinks water  and Powerade.  Sometimes skips meals Exercise:  Walks  But not routine Depression:  yes; Anxiety:  yes Other pain:  Some shoulder pain. Sleep hygiene:  Poor.  Trouble staying asleep.  Has CPAP.    HISTORY:  Migraines: Migraines since 68 years old.  Over the last 3 months, they have increased in frequency.  No known aggravating factor.  They are are usually severe occipital pounding headaches associated with seeing black spots, nausea, vomiting, photophobia, phonophobia but no speech disturbance, numbness or weakness.  They last 6 to 12 hours. They occur about 3 days a week.  No known triggers.  Laying down in dark quiet room helps.    Seizure-like activity/Altered mental status: She has longstanding history of seizure-like episodes with left sided jerking or confusion.  She has been previously worked up by Scientist, product/process development, Dr. Georjean, and her events have been determined to be psychogenic.  Long-term EEG monitoring captured at least 3 events, which were normal.    On 06/03/2023, she tripped over her dog and fell striking her head.  She did lose consciousness .  This occurred around 3 PM.  At 7:30 PM she developed slurred speech and seemed confused.  She didn't remember eating a meal.  No other focal neurologic deficits.  Symptoms lasted about 30 minutes and resolved.  She had a headache afterwards.  She was evaluated in the ED at [facility].  CT head revealed no acute findings and CTA of head and neck revealed no LVO or hemodynamically significant stenosis.  ECG and labs were negative for arrhythmia or acute coronary syndrome.  Symptoms resolved in the ED.  Concern for seizure, so she was started on Depakote  for 14  days.  She reportedly had a similar episode of slurred speech and confusion about 5 days prior.  Episodes lasted an hour.  She has also had a couple of other falls prior to this event, not associated with loss of consciousness.  She just loses her  balance.  Denies dizziness.  After the spells, she has jerking of her right arm.  She previously had spells of confusion with left sided jerking, which was determined to be psychogenic.  Recently she reports increased anxiety and sleep deprivation.  MRI of brain with and without contrast on 06/22/2023 showed minimal nonspecific scattered white matter hyperintensities, new from prior brain MRI of 06/24/17 and likely chronic small vessel ischemic changes, as well as mild generalized cerebral atrophy and cervical spondylosis with disc bulges/protrusions at C3-4 and C4-5, mild-moderate canal narrowing at C4-5 and mild canal narrowing at C3-4 and C5-6.  Routine awake and drowsy EEG on 06/13/2023 was normal.       Past NSAIDS/analgesics:  Ibuprofen, naproxen, Excedrin Migraine Past abortive triptans:  Sumatriptan  50mg , Maxalt -MLT 10mg  Past abortive ergotamine:  none Past muscle relaxants:  Flexeril , Robaxin , Zanflex Past anti-emetic:  Zofran  Past antihypertensive medications:  Propranolol , Lasix  Past antidepressant medications:  Amitriptyline  75mg , citalopram  Past anticonvulsant medications:  topiramate , divalproex  Past anti-CGRP:  Nurtec (effective - more effective than Ubrelvy , but not covered), Ubrelvy  (not covered) Past vitamins/Herbal/Supplements:  Melatonin, MVI Past antihistamines/decongestants:  Meclizine , Allegra Other past therapies:  none   Prior imaging of brain performed, personally reviewed: 02/12/2012:  Normal CT head. 08/09/2016 MRI BRAIN WO:  Negative MRI of brain for age.  No intracranial abnormality. 06/23/2017 CTA HEAD & NECK:  Minor atheromatous change at the carotid bifurcations. No flow-limiting extracranial or intracranial stenosis or dissection.  No acute intracranial findings. No abnormal postcontrast enhancement. 06/24/2017 MRI BRAIN WO:  Stable since 2016 and normal for age noncontrast MRI appearance of the brain.     Family history of headache:  No  PAST MEDICAL  HISTORY: Past Medical History:  Diagnosis Date   Acute cystitis without hematuria    Acute encephalopathy 06/25/2016   Altered mental status 06/23/2017   Anxiety    Anxiety and depression 02/04/2012   Arthritis    Cataract 08/31/2017   left eye   CHF (congestive heart failure) (HCC) 2017   resolved   Cholesterol serum increased    Chronic back pain    chronic Rt low back pain. s/p L4-5 fusion. failed Rt facet injections. poss due to Rt SI joint dysfunction.   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   Depression    Diabetes mellitus without complication (HCC)    Diastolic heart failure (HCC) 03/17/2012   Edema 02/04/2012   Encounter for long-term (current) use of medications 02/04/2012   Fall as cause of accidental injury at home as place of occurrence 09/08/2018   Gastroenteritis 01/13/2021   GERD (gastroesophageal reflux disease) 11/13/2015   Hallucination, visual 09/14/2017   Head injury 09/08/2018   Hyperlipidemia    Hyperparathyroidism    Hyperparathyroidism, secondary renal 07/31/2014   [facility] Fairy RONAL Sellar, MD  Plan:  25 Hydroxy Vit D (59.1 ng/ml)    Hypothyroidism    Moderate episode of recurrent major depressive disorder (HCC) 08/31/2016   Nausea and vomiting 07/08/2021   PONV (postoperative nausea and vomiting)    Rotator cuff  tear arthropathy of left shoulder 10/20/2018   Rotator cuff tear arthropathy of right shoulder 02/20/2021   Seizures (HCC)    3 years ago,2018   Sepsis (HCC) 04/29/2020   Sleep apnea    cpap   Slurred speech 06/20/2023   Syncope 08/30/2015   Syncope and collapse 02/12/2012   Tachycardia 02/04/2012   Trochanteric bursitis of both hips 2012   Confirmed on MRI    MEDICATIONS: Current Outpatient Medications on File Prior to Visit  Medication Sig  Dispense Refill   albuterol  (VENTOLIN  HFA) 108 (90 Base) MCG/ACT inhaler TAKE 2 PUFFS BY MOUTH EVERY 6 HOURS AS NEEDED FOR WHEEZE OR SHORTNESS OF BREATH 8.5 each 5   ALPRAZolam  (XANAX ) 0.5 MG tablet May take one tablet up to 2 times a day for anxiety. Avoid taking daily, if possible. 30 tablet 3   AMBULATORY NON FORMULARY MEDICATION Continuous positive airway pressure (CPAP) machine set at 15 cm of H2O pressure, with all supplemental supplies as needed. 1 each 0   AMBULATORY NON FORMULARY MEDICATION Continuous positive airway pressure (CPAP) machine set at 15 cm of H2O pressure. Supplemental supplies: Headgear with q 6 months, Full face mask q 3 months, Full face cushion q 3 months, 1 standard tubing q 3 months, 1 water  chamber q 6 months. 1 reusable filter q 6 months, 6 disposable filters q 3 months. Mask F-29. 1 each 0   aspirin  EC 81 MG tablet Take 1 tablet (81 mg total) by mouth daily. 90 tablet 3   atorvastatin  (LIPITOR) 20 MG tablet      blood glucose meter kit and supplies KIT Dispense based on patient and insurance preference. Use up to four times daily as directed. Check blood sugar three times weekly, then as needed. 1 each 0   buPROPion  (WELLBUTRIN  XL) 150 MG 24 hr tablet TAKE 1 TABLET BY MOUTH EVERY DAY 90 tablet 3   busPIRone  (BUSPAR ) 7.5 MG tablet TAKE 2 TABLETS (15 MG TOTAL) BY MOUTH 3 (THREE) TIMES DAILY. 540 tablet 3   cefpodoxime (VANTIN) 200 MG tablet Take 1 tablet (200 mg total) by mouth 2 (two) times daily. 10 tablet 0   cetirizine  (ZYRTEC ) 10 MG tablet Take 1 tablet (10 mg total) by mouth daily. 90 tablet 1   diclofenac  sodium (VOLTAREN ) 1 % GEL Apply 2 g topically daily as needed (for knee pain). 100 g 3   DULoxetine  (CYMBALTA ) 60 MG capsule TAKE 1 CAPSULE BY MOUTH EVERY DAY 90 capsule 3   fluconazole  (DIFLUCAN ) 150 MG tablet Take 1 tablet by mouth every 3 days for a total of 3 doses. 3 tablet 2   fluticasone  (FLONASE ) 50 MCG/ACT nasal spray PLACE 2 SPRAYS AT BEDTIME INTO BOTH  NOSTRILS 48 mL 1   furosemide  (LASIX ) 20 MG tablet TAKE 1 TABLET BY MOUTH EVERY DAY AS NEEDED LEG SWELLING 90 tablet 1   gabapentin  (NEURONTIN ) 600 MG tablet TAKE 1 TABLET (600 MG TOTAL) BY MOUTH EVERY MORNING AND 1.5 TABLETS (900 MG TOTAL) AT BEDTIME. 225 tablet 3   hydrOXYzine  (VISTARIL ) 50 MG capsule Take 1-2 tablets by mouth at bedtime for sleep and anxiety. Start with the lowest dose. May take 1 tablet up to 2 times during the day for acute anxiety symptoms only if needed. 360 capsule 3   levothyroxine  (SYNTHROID ) 50 MCG tablet TAKE 1 TABLET (50 MCG TOTAL) BY MOUTH EVERY OTHER DAY. BEFORE BREAKFAST. ALTERNATE WITH TABLET 90 tablet 1   levothyroxine  (SYNTHROID ) 75 MCG tablet TAKE 1 TABLET (  75 MCG TOTAL) BY MOUTH EVERY OTHER DAY. BEFORE BREAKFAST. ALTERNATE WITH 50MCG TABLET 90 tablet 1   meclizine  (ANTIVERT ) 50 MG tablet Take 0.5 tablets (25 mg total) by mouth 2 (two) times daily as needed for dizziness. 60 tablet 3   metFORMIN  (GLUCOPHAGE ) 500 MG tablet Take 1 tablet (500 mg total) by mouth 2 (two) times daily with a meal. 180 tablet 1   metoCLOPramide  (REGLAN ) 10 MG tablet TAKE 0.5 TABLET BY MOUTH 3 (THREE) TIMES DAILY AS NEEDED FOR NAUSEA OR VOMITING (NAUSEA/REFLUX). 30 tablet 1   Multiple Vitamins-Minerals (MULTIVITAMIN WITH MINERALS) tablet Take 1 tablet by mouth daily.     ondansetron  (ZOFRAN -ODT) 8 MG disintegrating tablet Take 1 tablet (8 mg total) by mouth every 8 (eight) hours as needed for nausea or vomiting. 20 tablet 0   pantoprazole  (PROTONIX ) 40 MG tablet Take 1 tablet (40 mg total) by mouth 2 (two) times daily. 180 tablet 1   Polyvinyl Alcohol-Povidone PF (REFRESH) 1.4-0.6 % SOLN Place 1 drop into both eyes daily as needed (dry eyes).     Probiotic Product (ALIGN) 4 MG CAPS Take 1 capsule (4 mg total) by mouth daily. 30 capsule 0   promethazine  (PHENERGAN ) 12.5 MG tablet Take 1 tablet (12.5 mg total) by mouth every 8 (eight) hours as needed for nausea or vomiting. 20 tablet 0    promethazine  (PHENERGAN ) 25 MG suppository Place 1 suppository (25 mg total) rectally every 6 (six) hours as needed for nausea or vomiting. 12 each 3   QUEtiapine  (SEROQUEL ) 300 MG tablet TAKE 1 TABLET BY MOUTH EVERYDAY AT BEDTIME 90 tablet 3   scopolamine  (TRANSDERM-SCOP) 1 MG/3DAYS Place one patch behind ear 24 hours before departure. Remove patch and replace with new every 72 hours (3 days) rotating location behind ear. (Patient not taking: Reported on 02/16/2024) 5 patch 1   valACYclovir  (VALTREX ) 500 MG tablet Take 1 tablet (500 mg total) by mouth daily. 90 tablet 3   zonisamide  (ZONEGRAN ) 100 MG capsule TAKE 1 CAPSULE BY MOUTH EVERY DAY 90 capsule 1   No current facility-administered medications on file prior to visit.    ALLERGIES: Allergies  Allergen Reactions   Tramadol Nausea And Vomiting   Trazodone  And Nefazodone Other (See Comments)    Per pt trazodone  caused hallucinations and behavior changes     FAMILY HISTORY: Family History  Problem Relation Age of Onset   Diabetes Mother    Heart disease Mother    Kidney disease Mother    Thyroid  disease Mother    Heart disease Father    Seizures Father    COPD Father    Irritable bowel syndrome Father    Diabetes Sister    Cancer Maternal Grandmother    Diabetes Brother    Colon cancer Neg Hx    Stomach cancer Neg Hx    Colon polyps Neg Hx    Esophageal cancer Neg Hx    Rectal cancer Neg Hx    BRCA 1/2 Neg Hx    Breast cancer Neg Hx       Objective:  *** General: No acute distress.  Patient appears well-groomed.   Head:  Normocephalic/atraumatic Neck:  Supple.  No paraspinal tenderness.  Full range of motion. Heart:  Regular rate and rhythm. Neuro:  Alert and oriented.  Speech fluent and not dysarthric.  Language intact.  CN II-XII intact.  Bulk and tone normal.  Muscle strength 5/5 throughout.  Sensation to light touch intact.  Deep tendon reflexes 2+ throughout,  toes downgoing.  Gait normal.  Romberg  negative.    Juliene Dunnings, DO  CC: Camie Doing, NP

## 2024-03-06 ENCOUNTER — Ambulatory Visit (INDEPENDENT_AMBULATORY_CARE_PROVIDER_SITE_OTHER): Admitting: Neurology

## 2024-03-06 ENCOUNTER — Encounter: Payer: Self-pay | Admitting: Neurology

## 2024-03-06 VITALS — BP 129/74 | HR 99 | Ht 62.0 in | Wt 170.0 lb

## 2024-03-06 DIAGNOSIS — G43009 Migraine without aura, not intractable, without status migrainosus: Secondary | ICD-10-CM | POA: Diagnosis not present

## 2024-03-06 MED ORDER — RIZATRIPTAN BENZOATE 10 MG PO TBDP
10.0000 mg | ORAL_TABLET | ORAL | 5 refills | Status: AC | PRN
  Filled 2024-05-27: qty 10, 30d supply, fill #0

## 2024-03-06 MED ORDER — ZONISAMIDE 100 MG PO CAPS
200.0000 mg | ORAL_CAPSULE | Freq: Every day | ORAL | 5 refills | Status: AC
  Filled 2024-04-23 – 2024-05-04 (×2): qty 60, 30d supply, fill #0

## 2024-03-06 NOTE — Patient Instructions (Signed)
 Increase zonisamide  to 2 pills daily  Rizatriptan  10mg  as needed Limit use of pain relievers to no more than 9 days out of the month to prevent risk of rebound or medication-overuse headache. Keep headache diary

## 2024-03-26 ENCOUNTER — Ambulatory Visit: Payer: Self-pay | Admitting: Nurse Practitioner

## 2024-03-26 ENCOUNTER — Encounter: Payer: Self-pay | Admitting: Nurse Practitioner

## 2024-03-26 VITALS — BP 122/74 | HR 119 | Ht 62.0 in | Wt 169.8 lb

## 2024-03-26 DIAGNOSIS — R7303 Prediabetes: Secondary | ICD-10-CM | POA: Diagnosis not present

## 2024-03-26 DIAGNOSIS — K219 Gastro-esophageal reflux disease without esophagitis: Secondary | ICD-10-CM

## 2024-03-26 DIAGNOSIS — N1832 Chronic kidney disease, stage 3b: Secondary | ICD-10-CM

## 2024-03-26 DIAGNOSIS — E039 Hypothyroidism, unspecified: Secondary | ICD-10-CM

## 2024-03-26 DIAGNOSIS — E782 Mixed hyperlipidemia: Secondary | ICD-10-CM | POA: Diagnosis not present

## 2024-03-26 DIAGNOSIS — G4733 Obstructive sleep apnea (adult) (pediatric): Secondary | ICD-10-CM

## 2024-03-26 DIAGNOSIS — Z0000 Encounter for general adult medical examination without abnormal findings: Secondary | ICD-10-CM

## 2024-03-26 DIAGNOSIS — E038 Other specified hypothyroidism: Secondary | ICD-10-CM

## 2024-03-26 LAB — LIPID PANEL

## 2024-03-26 MED ORDER — PANTOPRAZOLE SODIUM 40 MG PO TBEC
40.0000 mg | DELAYED_RELEASE_TABLET | Freq: Two times a day (BID) | ORAL | 1 refills | Status: AC
  Filled 2024-04-19 – 2024-06-13 (×4): qty 180, 90d supply, fill #0

## 2024-03-26 MED ORDER — METFORMIN HCL 500 MG PO TABS
500.0000 mg | ORAL_TABLET | Freq: Two times a day (BID) | ORAL | 1 refills | Status: AC
  Filled 2024-04-19 – 2024-05-27 (×4): qty 180, 90d supply, fill #0

## 2024-03-26 NOTE — Assessment & Plan Note (Signed)
 Recent nephrology visit confirmed stable labs with no changes in management. Will continue to monitor carefully.

## 2024-03-26 NOTE — Assessment & Plan Note (Signed)
 Currently managed with metformin . No concerns reported at this time. Will continue medication, diet, and exercise management.

## 2024-03-26 NOTE — Assessment & Plan Note (Signed)
 On levothyroxine  alternating 50 mcg / 75 mcg every other morning. Last labs were stable. No symptoms at this time. Will repeat labs today. She reports an adequate supply of medication. OK to refill as needed within the year.

## 2024-03-26 NOTE — Assessment & Plan Note (Signed)
 Managed with atorvastatin  20mg  daily without concerns. No changes at this time. Labs are pending for monitoring.

## 2024-03-26 NOTE — Assessment & Plan Note (Signed)
 Managed with pantoprazole  BID dosing. Efforts to taper off of medication have been unsuccessful resulting in return of reflux. Will plan to continue at current dose. Recommend staying up to date on DEXA scan for bone density monitoring.

## 2024-03-26 NOTE — Assessment & Plan Note (Signed)
 Well-managed with CPAP therapy. She reports consistent use and effectiveness of CPAP. - Continue CPAP therapy.

## 2024-03-26 NOTE — Progress Notes (Signed)
 Flu: two weeks ago, at [employer]  Covid: going back to get covid at [employer] also,  Pneumonia: at Corrie Catheline Doing, DNP, AGNP-c [facility] [address] [place], [place] [address] Main Office [phone] VISIT TYPE: CPE on 03/26/2024 Today's Vitals   03/26/24 1514  BP: 122/74  Pulse: (!) 119  Weight: 169 lb 12.8 oz (77 kg)  Height: 5' 2 (1.575 m)   Body mass index is 31.06 kg/m. BP 122/74   Pulse (!) 119   Ht 5' 2 (1.575 m)   Wt 169 lb 12.8 oz (77 kg)   BMI 31.06 kg/m   Subjective:    Patient ID: Jennifer Davidson, female    DOB: 1956/02/19, 68 y.o.   MRN: [id number]  HPI:  History of Present Illness Jennifer Davidson is a 68 year old female who presents for a follow-up visit after recent illness and to review lab results.  She experienced a severe illness lasting three weeks, during which she had two different bacterial infections. She felt extremely unwell, describing herself as 'dragging around the house,' and her husband was concerned about her condition. She eventually recovered after persistent effort.  She received a flu shot and a pneumonia shot a few weeks ago and plans to return for a COVID-19 vaccine. She recently saw her kidney doctor and recalls being told that her condition was good during the visit. She had labs done on November 3rd for her kidney doctor appointment.  She experiences occasional shortness of breath, sometimes even while sitting still, and uses her inhaler more frequently than before. The inhaler provides relief.  She has some swelling in her feet and ankles, which she manages with medication as needed. She denies any current swelling.  She uses a CPAP machine every night and reports that it helps her sleep, as she wakes herself up with snoring otherwise.  She takes levothyroxine  and metformin  regularly. She has plenty of levothyroxine  but needs a refill for metformin . She also takes pantoprazole  for her stomach.  She  occasionally gets mouth ulcers, which she treats with medication, and experiences chapped lips, especially when outside in the wind.  Pertinent items are noted in HPI.  Most Recent Depression Screen:     03/26/2024    3:13 PM 09/20/2023    2:11 PM 07/26/2023    1:37 PM 06/20/2023    9:06 AM 07/20/2022    1:44 PM  Depression screen PHQ 2/9  Decreased Interest 1 0 0 0 0  Down, Depressed, Hopeless 1 0 1 1 0  PHQ - 2 Score 2 0 1 1 0  Altered sleeping 2 2 2     Tired, decreased energy 2 2 2     Change in appetite 1 0 0    Feeling bad or failure about yourself  0 0 0    Trouble concentrating 1 0 0    Moving slowly or fidgety/restless 1 0 0    Suicidal thoughts 0 0 0    PHQ-9 Score 9 4  5      Difficult doing work/chores Somewhat difficult Somewhat difficult Somewhat difficult       Data saved with a previous flowsheet row definition   Most Recent Anxiety Screen:     02/20/2021    8:34 AM 11/16/2019   12:53 PM 11/20/2018   10:35 AM 10/26/2018    9:24 AM  GAD 7 : Generalized Anxiety Score  Nervous, Anxious, on Edge 1 1 1 2   Control/stop worrying 3 3 2  2  Worry too much - different things 3 3 2 2   Trouble relaxing 3 3 1 2   Restless 3 3 1 2   Easily annoyed or irritable 1 0 2 1  Afraid - awful might happen 3 0 1 2  Total GAD 7 Score 17 13 10 13   Anxiety Difficulty Somewhat difficult Somewhat difficult Somewhat difficult Somewhat difficult   Most Recent Fall Screen:    03/26/2024    3:13 PM 03/06/2024    9:49 AM 08/24/2023    8:10 AM 07/26/2023    1:36 PM 06/20/2023    9:05 AM  Fall Risk   Falls in the past year? 0 0 0 1 1  Comment    tripped coming in door   Number falls in past yr: 0 0 0 0 1  Injury with Fall? 0 0 0 1 1  Comment    hurt hip injured shoulder and hit head  Risk for fall due to : No Fall Risks No Fall Risks  Impaired mobility;Impaired balance/gait;Medication side effect Impaired balance/gait  Follow up Falls evaluation completed Falls evaluation completed Falls evaluation  completed Falls prevention discussed;Falls evaluation completed Falls evaluation completed    Past medical history, surgical history, medications, allergies, family history and social history reviewed with patient today and changes made to appropriate areas of the chart.  Past Medical History:  Past Medical History:  Diagnosis Date   Acute cystitis without hematuria    Acute encephalopathy 06/25/2016   Altered mental status 06/23/2017   Anxiety    Anxiety and depression 02/04/2012   Arthritis    Cataract 08/31/2017   left eye   CHF (congestive heart failure) (HCC) 2017   resolved   Cholesterol serum increased    Chronic back pain    chronic Rt low back pain. s/p L4-5 fusion. failed Rt facet injections. poss due to Rt SI joint dysfunction.   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   Depression    Diabetes mellitus without complication (HCC)    Diastolic heart failure (HCC) 03/17/2012   Edema 02/04/2012   Encounter for long-term (current) use of medications 02/04/2012   Fall as cause of accidental injury at home as place of occurrence 09/08/2018   Frequent falls 06/20/2023   Gastroenteritis 01/13/2021   GERD (gastroesophageal reflux disease) 11/13/2015   Hallucination, visual 09/14/2017   Head injury 09/08/2018   Hyperlipidemia    Hyperparathyroidism    Hyperparathyroidism, secondary renal 07/31/2014   [facility] Fairy RONAL Sellar, MD  Plan:  25 Hydroxy Vit D (59.1 ng/ml)    Hypothyroidism    Leg swelling 09/20/2023   Moderate episode of recurrent major depressive disorder (HCC) 08/31/2016   Motion sickness 09/20/2023   Nausea and vomiting 07/08/2021   Non-recurrent acute suppurative otitis media of right ear without spontaneous rupture of tympanic membrane 05/05/2022   PONV (postoperative nausea  and vomiting)    Rotator cuff tear arthropathy of left shoulder 10/20/2018   Rotator cuff tear arthropathy of right shoulder 02/20/2021   Seizures (HCC)    3 years ago,2018   Sepsis (HCC) 04/29/2020   Sleep apnea    cpap   Slurred speech 06/20/2023   Syncope 08/30/2015   Syncope and collapse 02/12/2012   Tachycardia 02/04/2012   Trochanteric bursitis of both hips 2012   Confirmed on MRI  Medications:  Current Outpatient Medications on File Prior to Visit  Medication Sig   albuterol  (VENTOLIN  HFA) 108 (90 Base) MCG/ACT inhaler TAKE 2 PUFFS BY MOUTH EVERY 6 HOURS AS NEEDED FOR WHEEZE OR SHORTNESS OF BREATH   ALPRAZolam  (XANAX ) 0.5 MG tablet May take one tablet up to 2 times a day for anxiety. Avoid taking daily, if possible.   AMBULATORY NON FORMULARY MEDICATION Continuous positive airway pressure (CPAP) machine set at 15 cm of H2O pressure, with all supplemental supplies as needed.   AMBULATORY NON FORMULARY MEDICATION Continuous positive airway pressure (CPAP) machine set at 15 cm of H2O pressure. Supplemental supplies: Headgear with q 6 months, Full face mask q 3 months, Full face cushion q 3 months, 1 standard tubing q 3 months, 1 water  chamber q 6 months. 1 reusable filter q 6 months, 6 disposable filters q 3 months. Mask F-29.   aspirin  EC 81 MG tablet Take 1 tablet (81 mg total) by mouth daily.   atorvastatin  (LIPITOR) 20 MG tablet    blood glucose meter kit and supplies KIT Dispense based on patient and insurance preference. Use up to four times daily as directed. Check blood sugar three times weekly, then as needed.   buPROPion  (WELLBUTRIN  XL) 150 MG 24 hr tablet TAKE 1 TABLET BY MOUTH EVERY DAY   busPIRone  (BUSPAR ) 7.5 MG tablet TAKE 2 TABLETS (15 MG TOTAL) BY MOUTH 3 (THREE) TIMES DAILY.   cetirizine  (ZYRTEC ) 10 MG tablet Take 1 tablet (10 mg total) by mouth daily.   diclofenac  sodium (VOLTAREN ) 1 % GEL Apply 2 g topically daily as needed (for knee pain).   DULoxetine   (CYMBALTA ) 60 MG capsule TAKE 1 CAPSULE BY MOUTH EVERY DAY   fluticasone  (FLONASE ) 50 MCG/ACT nasal spray PLACE 2 SPRAYS AT BEDTIME INTO BOTH NOSTRILS   furosemide  (LASIX ) 20 MG tablet TAKE 1 TABLET BY MOUTH EVERY DAY AS NEEDED LEG SWELLING   gabapentin  (NEURONTIN ) 600 MG tablet TAKE 1 TABLET (600 MG TOTAL) BY MOUTH EVERY MORNING AND 1.5 TABLETS (900 MG TOTAL) AT BEDTIME.   hydrOXYzine  (VISTARIL ) 50 MG capsule Take 1-2 tablets by mouth at bedtime for sleep and anxiety. Start with the lowest dose. May take 1 tablet up to 2 times during the day for acute anxiety symptoms only if needed.   levothyroxine  (SYNTHROID ) 50 MCG tablet TAKE 1 TABLET (50 MCG TOTAL) BY MOUTH EVERY OTHER DAY. BEFORE BREAKFAST. ALTERNATE WITH 75MCG TABLET   levothyroxine  (SYNTHROID ) 75 MCG tablet TAKE 1 TABLET (75 MCG TOTAL) BY MOUTH EVERY OTHER DAY. BEFORE BREAKFAST. ALTERNATE WITH 50MCG TABLET   meclizine  (ANTIVERT ) 50 MG tablet Take 0.5 tablets (25 mg total) by mouth 2 (two) times daily as needed for dizziness.   metoCLOPramide  (REGLAN ) 10 MG tablet TAKE 0.5 TABLET BY MOUTH 3 (THREE) TIMES DAILY AS NEEDED FOR NAUSEA OR VOMITING (NAUSEA/REFLUX).   Multiple Vitamins-Minerals (MULTIVITAMIN WITH MINERALS) tablet Take 1 tablet by mouth daily.   ondansetron  (ZOFRAN -ODT) 8 MG disintegrating tablet Take 1 tablet (8 mg total) by mouth every 8 (eight) hours as needed for nausea or vomiting.   Polyvinyl Alcohol-Povidone PF (REFRESH) 1.4-0.6 % SOLN Place 1 drop into both eyes daily as needed (dry eyes).   Probiotic Product (ALIGN) 4 MG CAPS Take 1 capsule (4 mg total) by mouth daily.   promethazine  (PHENERGAN ) 12.5 MG tablet Take 1 tablet (12.5 mg total) by mouth every 8 (eight) hours as needed for nausea or vomiting.   promethazine  (PHENERGAN ) 25 MG suppository Place 1 suppository (  25 mg total) rectally every 6 (six) hours as needed for nausea or vomiting.   QUEtiapine  (SEROQUEL ) 300 MG tablet TAKE 1 TABLET BY MOUTH EVERYDAY AT BEDTIME    rizatriptan  (MAXALT -MLT) 10 MG disintegrating tablet Take 1 tablet (10 mg total) by mouth as needed for migraine. May repeat in 2 hours if needed.  Maximum 2 tablets in 24 hours.   valACYclovir  (VALTREX ) 500 MG tablet Take 1 tablet (500 mg total) by mouth daily.   zonisamide  (ZONEGRAN ) 100 MG capsule Take 2 capsules (200 mg total) by mouth daily.   No current facility-administered medications on file prior to visit.   Surgical History:  Past Surgical History:  Procedure Laterality Date   ABDOMINAL HYSTERECTOMY     APPENDECTOMY  1986   BACK SURGERY     L4-5 fusion   CATARACT EXTRACTION     KNEE SURGERY     REVERSE SHOULDER ARTHROPLASTY Left 07/03/2020   Procedure: REVERSE SHOULDER ARTHROPLASTY;  Surgeon: Dozier Soulier, MD;  Location: [facility];  Service: Orthopedics;  Laterality: Left;   REVERSE SHOULDER ARTHROPLASTY Right 03/05/2021   Procedure: REVERSE SHOULDER ARTHROPLASTY;  Surgeon: Dozier Soulier, MD;  Location: [facility];  Service: Orthopedics;  Laterality: Right;   SHOULDER ARTHROSCOPY WITH SUBACROMIAL DECOMPRESSION Left 10/20/2018   Procedure: SHOULDER ARTHROSCOPY, ROTATOR CUFF DEBRIDEMENT, GLENOHUMERAL JOINT DEBRIDEMENT, ACROMIOPLASTY, DISTAL CLAVICLE RESECTION;  Surgeon: Yvone Rush, MD;  Location: [facility];  Service: Orthopedics;  Laterality: Left;   Allergies:  Allergies  Allergen Reactions   Tramadol Nausea And Vomiting   Trazodone  And Nefazodone Other (See Comments)    Per pt trazodone  caused hallucinations and behavior changes    Family History:  Family History  Problem Relation Age of Onset   Diabetes Mother    Heart disease Mother    Kidney disease Mother    Thyroid  disease Mother    Heart disease Father    Seizures Father    COPD Father    Irritable bowel syndrome Father    Diabetes Sister    Cancer Maternal Grandmother    Diabetes Brother    Colon cancer Neg Hx    Stomach cancer Neg Hx    Colon polyps Neg Hx    Esophageal cancer Neg Hx    Rectal cancer Neg  Hx    BRCA 1/2 Neg Hx    Breast cancer Neg Hx        Objective:    BP 122/74   Pulse (!) 119   Ht 5' 2 (1.575 m)   Wt 169 lb 12.8 oz (77 kg)   BMI 31.06 kg/m   Wt Readings from Last 3 Encounters:  03/26/24 169 lb 12.8 oz (77 kg)  03/06/24 170 lb (77.1 kg)  02/16/24 171 lb 12.8 oz (77.9 kg)    Physical Exam Vitals and nursing note reviewed.  Constitutional:      General: She is not in acute distress.    Appearance: Normal appearance.  HENT:     Head: Normocephalic and atraumatic.     Right Ear: Hearing, tympanic membrane, ear canal and external ear normal.     Left Ear: Hearing, tympanic membrane, ear canal and external ear normal.     Nose: Nose normal.     Right Sinus: No maxillary sinus tenderness or frontal sinus tenderness.     Left Sinus: No maxillary sinus tenderness or frontal sinus tenderness.     Mouth/Throat:     Lips: Pink.     Mouth: Mucous membranes are moist.  Pharynx: Oropharynx is clear.  Eyes:     General: Lids are normal. Vision grossly intact. No scleral icterus.    Extraocular Movements: Extraocular movements intact.     Conjunctiva/sclera: Conjunctivae normal.     Pupils: Pupils are equal, round, and reactive to light.     Funduscopic exam:    Right eye: Red reflex present.        Left eye: Red reflex present.    Visual Fields: Right eye visual fields normal and left eye visual fields normal.  Neck:     Thyroid : No thyromegaly.     Vascular: No carotid bruit.  Cardiovascular:     Rate and Rhythm: Normal rate and regular rhythm.     Chest Wall: PMI is not displaced.     Pulses: Normal pulses.          Dorsalis pedis pulses are 2+ on the right side and 2+ on the left side.       Posterior tibial pulses are 2+ on the right side and 2+ on the left side.     Heart sounds: Normal heart sounds. No murmur heard. Pulmonary:     Effort: Pulmonary effort is normal. No respiratory distress.     Breath sounds: Normal breath sounds.  Abdominal:      General: Abdomen is flat. Bowel sounds are normal. There is no distension.     Palpations: Abdomen is soft. There is no hepatomegaly, splenomegaly or mass.     Tenderness: There is no abdominal tenderness. There is no right CVA tenderness, left CVA tenderness, guarding or rebound.  Musculoskeletal:        General: Normal range of motion.     Cervical back: Full passive range of motion without pain, normal range of motion and neck supple. No tenderness.     Right lower leg: No edema.     Left lower leg: No edema.  Feet:     Left foot:     Toenail Condition: Left toenails are normal.  Lymphadenopathy:     Cervical: No cervical adenopathy.     Upper Body:     Right upper body: No supraclavicular adenopathy.     Left upper body: No supraclavicular adenopathy.  Skin:    General: Skin is warm and dry.     Capillary Refill: Capillary refill takes less than 2 seconds.     Nails: There is no clubbing.  Neurological:     General: No focal deficit present.     Mental Status: She is alert and oriented to person, place, and time.     GCS: GCS eye subscore is 4. GCS verbal subscore is 5. GCS motor subscore is 6.     Sensory: Sensation is intact. No sensory deficit.     Motor: Motor function is intact. No weakness.     Coordination: Coordination is intact. Coordination normal.     Gait: Gait is intact. Gait normal.     Deep Tendon Reflexes: Reflexes are normal and symmetric.  Psychiatric:        Attention and Perception: Attention normal.        Mood and Affect: Mood normal.        Speech: Speech normal.        Behavior: Behavior normal. Behavior is cooperative.        Thought Content: Thought content normal.        Cognition and Memory: Cognition and memory normal.        Judgment: Judgment normal.  Results for orders placed or performed in visit on 02/16/24  POCT URINALYSIS DIP (CLINITEK)   Collection Time: 02/16/24  3:19 PM  Result Value Ref Range   Color, UA yellow yellow    Clarity, UA cloudy (A) clear   Glucose, UA negative negative mg/dL   Bilirubin, UA negative negative   Ketones, POC UA negative negative mg/dL   Spec Grav, UA 8.989 8.989 - 1.025   Blood, UA negative negative   pH, UA 6.0 5.0 - 8.0   POC PROTEIN,UA trace negative, trace   Urobilinogen, UA 0.2 0.2 or 1.0 E.U./dL   Nitrite, UA Negative Negative   Leukocytes, UA Large (3+) (A) Negative  Urine Culture   Collection Time: 02/16/24  3:31 PM   Specimen: Urine   UR  Result Value Ref Range   Urine Culture, Routine Final report (A)    Organism ID, Bacteria Comment (A)    ORGANISM ID, BACTERIA Klebsiella aerogenes (A)    Antimicrobial Susceptibility Comment        Assessment & Plan:   Problem List Items Addressed This Visit     Hypothyroid (Chronic)   On levothyroxine  alternating 50 mcg / 75 mcg every other morning. Last labs were stable. No symptoms at this time. Will repeat labs today. She reports an adequate supply of medication. OK to refill as needed within the year.       Relevant Orders   TSH + free T4   Stage 3b chronic kidney disease (HCC) (Chronic)   Recent nephrology visit confirmed stable labs with no changes in management. Will continue to monitor carefully.       Mixed hyperlipidemia   Managed with atorvastatin  20mg  daily without concerns. No changes at this time. Labs are pending for monitoring.       Relevant Orders   Lipid Panel   Gastroesophageal reflux disease   Managed with pantoprazole  BID dosing. Efforts to taper off of medication have been unsuccessful resulting in return of reflux. Will plan to continue at current dose. Recommend staying up to date on DEXA scan for bone density monitoring.       Relevant Medications   pantoprazole  (PROTONIX ) 40 MG tablet   Prediabetes   Currently managed with metformin . No concerns reported at this time. Will continue medication, diet, and exercise management.       Relevant Medications   metFORMIN  (GLUCOPHAGE ) 500 MG  tablet   Other Relevant Orders   Hemoglobin A1c   OSA (obstructive sleep apnea)   Well-managed with CPAP therapy. She reports consistent use and effectiveness of CPAP. - Continue CPAP therapy.       Encounter for annual physical exam - Primary   CPE completed today. Review of HM activities and recommendations discussed and provided on AVS. Anticipatory guidance, diet, and exercise recommendations provided. Medications, allergies, and hx reviewed and updated as necessary. Orders placed as listed below.  Plan: - Labs ordered. Will make changes as necessary based on results.  - I will review these results and send recommendations via MyChart or a telephone call.  - F/U with CPE in 1 year or sooner for acute/chronic health needs as directed.        Other Visit Diagnoses       Stage 2 chronic kidney disease  (Chronic)            Follow up plan: No follow-ups on file.  NEXT PREVENTATIVE PHYSICAL DUE IN 1 YEAR.  PATIENT COUNSELING PROVIDED FOR ALL ADULT PATIENTS: A well  balanced diet low in saturated fats, cholesterol, and moderation in carbohydrates.  This can be as simple as monitoring portion sizes and cutting back on sugary beverages such as soda and juice to start with.    Daily water  consumption of at least 64 ounces.  Physical activity at least 180 minutes per week.  If just starting out, start 10 minutes a day and work your way up.   This can be as simple as taking the stairs instead of the elevator and walking 2-3 laps around the office  purposefully every day.   STD protection, partner selection, and regular testing if high risk.  Limited consumption of alcoholic beverages if alcohol is consumed. For men, I recommend no more than 14 alcoholic beverages per week, spread out throughout the week (max 2 per day). Avoid binge drinking or consuming large quantities of alcohol in one setting.  Please let me know if you feel you may need help with reduction or quitting  alcohol consumption.   Avoidance of nicotine, if used. Please let me know if you feel you may need help with reduction or quitting nicotine use.   Daily mental health attention. This can be in the form of 5 minute daily meditation, prayer, journaling, yoga, reflection, etc.  Purposeful attention to your emotions and mental state can significantly improve your overall wellbeing  and  Health.  Please know that I am here to help you with all of your health care goals and am happy to work with you to find a solution that works best for you.  The greatest advice I have received with any changes in life are to take it one step at a time, that even means if all you can focus on is the next 60 seconds, then do that and celebrate your victories.  With any changes in life, you will have set backs, and that is OK. The important thing to remember is, if you have a set back, it is not a failure, it is an opportunity to try again! Screening Testing Mammogram Every 1 -2 years based on history and risk factors Starting at age 98 Pap Smear Ages 21-39 every 3 years Ages 81-65 every 5 years with HPV testing More frequent testing may be required based on results and history Colon Cancer Screening Every 1-10 years based on test performed, risk factors, and history Starting at age 36 Bone Density Screening Every 2-10 years based on history Starting at age 63 for women Recommendations for men differ based on medication usage, history, and risk factors AAA Screening One time ultrasound Men 18-66 years old who have every smoked Lung Cancer Screening Low Dose Lung CT every 12 months Age 2-80 years with a 30 pack-year smoking history who still smoke or who have quit within the last 15 years   Screening Labs Routine  Labs: Complete Blood Count (CBC), Complete Metabolic Panel (CMP), Cholesterol (Lipid Panel) Every 6-12 months based on history and medications May be recommended more frequently based on  current conditions or previous results Hemoglobin A1c Lab Every 3-12 months based on history and previous results Starting at age 80 or earlier with diagnosis of diabetes, high cholesterol, BMI >26, and/or risk factors Frequent monitoring for patients with diabetes to ensure blood sugar control Thyroid  Panel (TSH) Every 6 months based on history, symptoms, and risk factors May be repeated more often if on medication HIV One time testing for all patients 39 and older May be repeated more frequently for patients with increased  risk factors or exposure Hepatitis C One time testing for all patients 8 and older May be repeated more frequently for patients with increased risk factors or exposure Gonorrhea, Chlamydia Every 12 months for all sexually active persons 13-24 years Additional monitoring may be recommended for those who are considered high risk or who have symptoms Every 12 months for any woman on birth control, regardless of sexual activity PSA Men 17-39 years old with risk factors Additional screening may be recommended from age 45-69 based on risk factors, symptoms, and history  Vaccine Recommendations Tetanus Booster All adults every 10 years Flu Vaccine All patients 6 months and older every year COVID Vaccine All patients 12 years and older Initial dosing with booster May recommend additional booster based on age and health history HPV Vaccine 2 doses all patients age 65-26 Dosing may be considered for patients over 26 Shingles Vaccine (Shingrix) 2 doses all adults 55 years and older Pneumonia (Pneumovax 40) All adults 65 years and older May recommend earlier dosing based on health history One year apart from Prevnar 45 Pneumonia (Prevnar 77) All adults 65 years and older Dosed 1 year after Pneumovax 23 Pneumonia (Prevnar 20) One time alternative to the two dosing of 13 and 23 For all adults with initial dose of 23, 20 is recommended 1 year later For all adults  with initial dose of 13, 23 is still recommended as second option 1 year later

## 2024-03-26 NOTE — Assessment & Plan Note (Signed)

## 2024-03-27 LAB — HEMOGLOBIN A1C
Est. average glucose Bld gHb Est-mCnc: 100 mg/dL
Hgb A1c MFr Bld: 5.1 % (ref 4.8–5.6)

## 2024-03-27 LAB — LIPID PANEL
Cholesterol, Total: 272 mg/dL — AB (ref 100–199)
HDL: 71 mg/dL (ref >39)
LDL CALC COMMENT:: 3.8 ratio (ref 0.0–4.4)
LDL Chol Calc (NIH): 132 mg/dL — AB (ref 0–99)
Triglycerides: 390 mg/dL — AB (ref 0–149)
VLDL Cholesterol Cal: 69 mg/dL — AB (ref 5–40)

## 2024-03-27 LAB — TSH+FREE T4
Free T4: 0.99 ng/dL (ref 0.82–1.77)
TSH: 1.51 u[IU]/mL (ref 0.450–4.500)

## 2024-03-28 ENCOUNTER — Ambulatory Visit: Payer: Self-pay | Admitting: Nurse Practitioner

## 2024-03-29 ENCOUNTER — Encounter: Payer: Self-pay | Admitting: Nurse Practitioner

## 2024-03-30 ENCOUNTER — Other Ambulatory Visit: Payer: Self-pay | Admitting: Nurse Practitioner

## 2024-03-30 DIAGNOSIS — E782 Mixed hyperlipidemia: Secondary | ICD-10-CM

## 2024-03-30 DIAGNOSIS — F39 Unspecified mood [affective] disorder: Secondary | ICD-10-CM

## 2024-03-30 DIAGNOSIS — N1832 Chronic kidney disease, stage 3b: Secondary | ICD-10-CM

## 2024-03-30 DIAGNOSIS — R7303 Prediabetes: Secondary | ICD-10-CM

## 2024-03-30 DIAGNOSIS — F4310 Post-traumatic stress disorder, unspecified: Secondary | ICD-10-CM

## 2024-03-30 MED ORDER — REPATHA 140 MG/ML ~~LOC~~ SOSY: PREFILLED_SYRINGE | SUBCUTANEOUS | 2 refills | Status: DC

## 2024-04-03 ENCOUNTER — Telehealth: Payer: Self-pay | Admitting: *Deleted

## 2024-04-03 NOTE — Progress Notes (Signed)
 Care Guide Pharmacy Note  04/03/2024 Name: Jennifer Davidson MRN: [id number] DOB: [birth year]  Referred By: Oris Camie BRAVO, NP Reason for referral: Call Attempt #1 and Complex Care Management (Outreach to schedule referral with pharmacist )   Jennifer Davidson is a 68 y.o. year old female who is a primary care patient of Early, Camie BRAVO, NP.  Jennifer Davidson was referred to the pharmacist for assistance related to: HLD  Successful contact was made with the patient to discuss pharmacy services including being ready for the pharmacist to call at least 5 minutes before the scheduled appointment time and to have medication bottles and any blood pressure readings ready for review. The patient agreed to meet with the pharmacist via telephone visit on 05/03/2024  Thedford Franks, CMA [employer]  [facility] Guide Direct Dial: [phone]  Fax: [phone] Website: [email or web address]

## 2024-04-04 ENCOUNTER — Other Ambulatory Visit (HOSPITAL_BASED_OUTPATIENT_CLINIC_OR_DEPARTMENT_OTHER): Payer: Self-pay

## 2024-04-05 ENCOUNTER — Other Ambulatory Visit (HOSPITAL_BASED_OUTPATIENT_CLINIC_OR_DEPARTMENT_OTHER): Payer: Self-pay

## 2024-04-05 MED ORDER — VALACYCLOVIR HCL 500 MG PO TABS
500.0000 mg | ORAL_TABLET | Freq: Two times a day (BID) | ORAL | 2 refills | Status: AC
  Filled 2024-05-04: qty 180, 90d supply, fill #0

## 2024-04-05 MED ORDER — ZONISAMIDE 100 MG PO CAPS
100.0000 mg | ORAL_CAPSULE | Freq: Every day | ORAL | 1 refills | Status: DC
  Filled 2024-04-19 – 2024-04-27 (×2): qty 90, 90d supply, fill #0

## 2024-04-05 MED ORDER — FLUCONAZOLE 150 MG PO TABS
150.0000 mg | ORAL_TABLET | ORAL | 2 refills | Status: DC
  Filled 2024-04-19: qty 3, 9d supply, fill #0
  Filled 2024-04-24 – 2024-04-26 (×2): qty 3, 9d supply, fill #1

## 2024-04-05 MED FILL — Gabapentin Tab 600 MG: ORAL | 90 days supply | Qty: 225 | Fill #0 | Status: CN

## 2024-04-09 ENCOUNTER — Other Ambulatory Visit (HOSPITAL_BASED_OUTPATIENT_CLINIC_OR_DEPARTMENT_OTHER): Payer: Self-pay

## 2024-04-19 ENCOUNTER — Other Ambulatory Visit (HOSPITAL_BASED_OUTPATIENT_CLINIC_OR_DEPARTMENT_OTHER): Payer: Self-pay

## 2024-04-19 ENCOUNTER — Other Ambulatory Visit: Payer: Self-pay

## 2024-04-19 ENCOUNTER — Other Ambulatory Visit: Payer: Self-pay | Admitting: Nurse Practitioner

## 2024-04-19 DIAGNOSIS — F4310 Post-traumatic stress disorder, unspecified: Secondary | ICD-10-CM

## 2024-04-19 DIAGNOSIS — F411 Generalized anxiety disorder: Secondary | ICD-10-CM

## 2024-04-19 DIAGNOSIS — F339 Major depressive disorder, recurrent, unspecified: Secondary | ICD-10-CM

## 2024-04-19 MED ORDER — HYDROXYZINE PAMOATE 50 MG PO CAPS
ORAL_CAPSULE | ORAL | 0 refills | Status: DC
  Filled 2024-04-19: qty 360, 90d supply, fill #0

## 2024-04-19 MED FILL — Duloxetine HCl Enteric Coated Pellets Cap 60 MG (Base Eq): ORAL | 90 days supply | Qty: 90 | Fill #0 | Status: AC

## 2024-04-19 MED FILL — Levothyroxine Sodium Tab 50 MCG: ORAL | 90 days supply | Qty: 45 | Fill #0 | Status: CN

## 2024-04-19 MED FILL — Buspirone HCl Tab 7.5 MG: ORAL | 90 days supply | Qty: 540 | Fill #0 | Status: AC

## 2024-04-19 MED FILL — Bupropion HCl Tab ER 24HR 150 MG: ORAL | 90 days supply | Qty: 90 | Fill #0 | Status: AC

## 2024-04-19 MED FILL — Fluticasone Propionate Nasal Susp 50 MCG/ACT: NASAL | 90 days supply | Qty: 48 | Fill #0 | Status: CN

## 2024-04-19 NOTE — Telephone Encounter (Signed)
 Ok to refill

## 2024-04-20 ENCOUNTER — Other Ambulatory Visit (HOSPITAL_COMMUNITY): Payer: Self-pay

## 2024-04-20 ENCOUNTER — Telehealth: Payer: Self-pay

## 2024-04-20 NOTE — Telephone Encounter (Signed)
 Pharmacy Patient Advocate Encounter  Received notification from [facility] that Prior Authorization for hydrOXYzine  Pamoate 50MG  capsules  has been APPROVED to 12.5.26. Ran test claim, Copay is $13.93. This test claim was processed through [facility]- copay amounts may vary at other pharmacies due to pharmacy/plan contracts, or as the patient moves through the different stages of their insurance plan.

## 2024-04-20 NOTE — Telephone Encounter (Signed)
 Pharmacy Patient Advocate Encounter   Received notification from Onbase that prior authorization for hydrOXYzine  Pamoate 50MG  capsules  is required/requested.   Insurance verification completed.   The patient is insured through [facility].   Per test claim: PA required; PA submitted to above mentioned insurance via Latent Key/confirmation #/EOC [id number] Status is pending

## 2024-04-21 ENCOUNTER — Other Ambulatory Visit (HOSPITAL_BASED_OUTPATIENT_CLINIC_OR_DEPARTMENT_OTHER): Payer: Self-pay

## 2024-04-23 ENCOUNTER — Other Ambulatory Visit (HOSPITAL_BASED_OUTPATIENT_CLINIC_OR_DEPARTMENT_OTHER): Payer: Self-pay

## 2024-04-23 MED FILL — Albuterol Sulfate Inhal Aero 108 MCG/ACT (90MCG Base Equiv): RESPIRATORY_TRACT | 20 days supply | Qty: 8.5 | Fill #0 | Status: CN

## 2024-04-23 MED FILL — Fluticasone Propionate Nasal Susp 50 MCG/ACT: NASAL | 90 days supply | Qty: 48 | Fill #0 | Status: CN

## 2024-04-23 MED FILL — Quetiapine Fumarate Tab 300 MG: ORAL | 90 days supply | Qty: 90 | Fill #0 | Status: CN

## 2024-04-23 MED FILL — Gabapentin Tab 600 MG: ORAL | 90 days supply | Qty: 225 | Fill #0 | Status: AC

## 2024-04-24 ENCOUNTER — Other Ambulatory Visit (HOSPITAL_BASED_OUTPATIENT_CLINIC_OR_DEPARTMENT_OTHER): Payer: Self-pay

## 2024-04-24 MED FILL — Albuterol Sulfate Inhal Aero 108 MCG/ACT (90MCG Base Equiv): RESPIRATORY_TRACT | 17 days supply | Qty: 6.7 | Fill #0 | Status: AC

## 2024-04-25 ENCOUNTER — Other Ambulatory Visit (HOSPITAL_COMMUNITY): Payer: Self-pay

## 2024-04-25 ENCOUNTER — Telehealth: Payer: Self-pay

## 2024-04-25 NOTE — Telephone Encounter (Addendum)
 Pharmacy Patient Advocate Encounter  Received notification from [facility] MEDIC[facility] that Prior Authorization for Repatha  SureClick 140MG /ML auto-injectors  has been APPROVED  to 12.10.26. Ran test claim, Copay is $109.62. This test claim was processed through [facility]- copay amounts may vary at other pharmacies due to pharmacy/plan contracts, or as the patient moves through the different stages of their insurance plan.

## 2024-04-25 NOTE — Telephone Encounter (Signed)
 Pharmacy Patient Advocate Encounter   Received notification from Onbase that prior authorization for (REPATHA ) 140 MG/ML SOSY  is required/requested.   Insurance verification completed.   The patient is insured through [facility].   Per test claim: PA required; PA submitted to above mentioned insurance via Latent Key/confirmation #/EOC [id number] Status is pending

## 2024-04-26 ENCOUNTER — Other Ambulatory Visit (HOSPITAL_BASED_OUTPATIENT_CLINIC_OR_DEPARTMENT_OTHER): Payer: Self-pay

## 2024-04-27 ENCOUNTER — Telehealth: Payer: Self-pay | Admitting: Nurse Practitioner

## 2024-04-27 ENCOUNTER — Other Ambulatory Visit (HOSPITAL_BASED_OUTPATIENT_CLINIC_OR_DEPARTMENT_OTHER): Payer: Self-pay

## 2024-04-27 NOTE — Telephone Encounter (Signed)
 Hey,  Patient is wanting clarification on if this appt is in person or via phone. Looks like you may have booked. Can you contact patient and clarify preference and update appt to reflect correctly on mychart?  Thanks!

## 2024-04-27 NOTE — Telephone Encounter (Signed)
 Copied from CRM #[id number]. Topic: Appointments - Appointment Info/Confirmation >> Apr 26, 2024 12:43 PM Ahlexyia S wrote: Patient is calling for information regarding an appointment. Per pt chart this appointment is a in person not virtual, from pt understanding the appointment is scheduled to be a phone call. I was unable to reach someone from [facility] due to offices closing early for holiday event. I informed pt that I will send a message and someone will contact her Monday with more info but she should also plan to come in unless she is contacted with other instructions. Pt has also been advised of turnaround time.

## 2024-05-02 ENCOUNTER — Other Ambulatory Visit (HOSPITAL_BASED_OUTPATIENT_CLINIC_OR_DEPARTMENT_OTHER): Payer: Self-pay

## 2024-05-03 ENCOUNTER — Other Ambulatory Visit (HOSPITAL_BASED_OUTPATIENT_CLINIC_OR_DEPARTMENT_OTHER): Payer: Self-pay

## 2024-05-03 ENCOUNTER — Other Ambulatory Visit

## 2024-05-03 DIAGNOSIS — M7989 Other specified soft tissue disorders: Secondary | ICD-10-CM

## 2024-05-03 DIAGNOSIS — E038 Other specified hypothyroidism: Secondary | ICD-10-CM

## 2024-05-03 DIAGNOSIS — E782 Mixed hyperlipidemia: Secondary | ICD-10-CM

## 2024-05-03 MED ORDER — FUROSEMIDE 20 MG PO TABS
ORAL_TABLET | ORAL | 1 refills | Status: DC
  Filled 2024-05-03: qty 90, 90d supply, fill #0

## 2024-05-03 MED ORDER — REPATHA SURECLICK 140 MG/ML ~~LOC~~ SOAJ
140.0000 mg | SUBCUTANEOUS | 2 refills | Status: AC
  Filled 2024-05-03: qty 2, 28d supply, fill #0
  Filled 2024-05-27: qty 2, 28d supply, fill #1
  Filled 2024-06-13 – 2024-06-18 (×2): qty 2, 28d supply, fill #2

## 2024-05-03 MED ORDER — LEVOTHYROXINE SODIUM 75 MCG PO TABS
75.0000 ug | ORAL_TABLET | ORAL | 1 refills | Status: AC
  Filled 2024-05-03: qty 45, 90d supply, fill #0

## 2024-05-03 NOTE — Progress Notes (Cosign Needed)
 05/03/2024 Name: Jennifer Davidson MRN: [id number] DOB: 05-26-1955  Chief Complaint  Patient presents with   Medication Management   Hyperlipidemia    Jennifer Davidson is a 68 y.o. year old female who presented for a telephone visit.   They were referred to the pharmacist by their PCP for assistance in managing hyperlipidemia/cardiovascular risk reduction, medication access, and complex medication management.    Subjective:  Care Team: Primary Care Provider: Early, Sara E, NP   Medication Access/Adherence  Current Pharmacy:  [facility] [address] [place] [address] Phone: [phone] Fax: [phone]   Patient reports affordability concerns with their medications: Yes  - repatha  Patient reports access/transportation concerns to their pharmacy: No  Patient reports adherence concerns with their medications:  No     Hyperlipidemia/ASCVD Risk Reduction  Current lipid lowering medications: None, repatha  prescribed, has not started, expensive Medications tried in the past: Atorvastatin  (myalgia)     Objective:  Lab Results  Component Value Date   HGBA1C 5.1 03/26/2024    Lab Results  Component Value Date   CREATININE 1.31 (H) 10/24/2023   BUN 14 10/24/2023   NA 138 10/24/2023   K 5.3 (H) 10/24/2023   CL 104 10/24/2023   CO2 21 10/24/2023    Lab Results  Component Value Date   CHOL 272 (H) 03/26/2024   HDL 71 03/26/2024   LDLCALC 132 (H) 03/26/2024   TRIG 390 (H) 03/26/2024   CHOLHDL 3.8 03/26/2024    Medications Reviewed Today     Reviewed by Lionell Jon DEL, RPH (Pharmacist) on 05/03/24 at 1618  Med List Status: <None>   Medication Order Taking? Sig Documenting Provider Last Dose Status Informant  albuterol  (VENTOLIN  HFA) 108 (90 Base) MCG/ACT inhaler [id number] Yes TAKE 2 PUFFS BY MOUTH EVERY 6 HOURS AS NEEDED FOR WHEEZE OR SHORTNESS OF BREATH Early, Sara E, NP  Active   ALPRAZolam  (XANAX ) 0.5 MG tablet  [id number] Yes May take one tablet up to 2 times a day for anxiety. Avoid taking daily, if possible. Oris Camie BRAVO, NP  Active   AMBULATORY NON FORMULARY MEDICATION [id number] Yes Continuous positive airway pressure (CPAP) machine set at 15 cm of H2O pressure, with all supplemental supplies as needed. Oris Camie BRAVO, NP  Active   AMBULATORY NON FORMULARY MEDICATION [id number] Yes Continuous positive airway pressure (CPAP) machine set at 15 cm of H2O pressure. Supplemental supplies: Headgear with q 6 months, Full face mask q 3 months, Full face cushion q 3 months, 1 standard tubing q 3 months, 1 water  chamber q 6 months. 1 reusable filter q 6 months, 6 disposable filters q 3 months. Mask F-29. Early, Sara E, NP  Active   aspirin  EC 81 MG tablet [id number] Yes Take 1 tablet (81 mg total) by mouth daily. Shlomo Wilbert SAUNDERS, MD  Active Self     Discontinued 05/03/24 1304 (Change in therapy)   blood glucose meter kit and supplies KIT [id number]  Dispense based on patient and insurance preference. Use up to four times daily as directed. Check blood sugar three times weekly, then as needed. Early, Sara E, NP  Active Self  buPROPion  (WELLBUTRIN  XL) 150 MG 24 hr tablet [id number] Yes TAKE 1 TABLET BY MOUTH EVERY DAY Early, Sara E, NP  Active   busPIRone  (BUSPAR ) 7.5 MG tablet [id number] Yes TAKE 2 TABLETS (15 MG TOTAL) BY MOUTH 3 (THREE) TIMES DAILY. Early, Sara E, NP  Active  Discontinued 05/03/24 1304 (Patient Preference)     Discontinued 05/03/24 1304 (Patient Preference) DULoxetine  (CYMBALTA ) 60 MG capsule [id number] Yes TAKE 1 CAPSULE (60 mg) BY MOUTH EVERY DAY Early, Sara E, NP  Active   Evolocumab  (REPATHA  SURECLICK) 140 MG/ML SOAJ [id number] Yes Inject 140 mg into the skin every 14 (fourteen) days. Early, Sara E, NP  Active     Discontinued 05/03/24 1337 (Duplicate)     Discontinued 05/03/24 1305 (Completed Course)   fluticasone  (FLONASE ) 50 MCG/ACT nasal spray [id number] Yes PLACE 2 SPRAYS AT BEDTIME INTO BOTH  NOSTRILS Early, Sara E, NP  Active   furosemide  (LASIX ) 20 MG tablet [id number] Yes TAKE 1 TABLET BY MOUTH EVERY DAY AS NEEDED LEG SWELLING  Patient taking differently: daily as needed for fluid. TAKE 1 TABLET BY MOUTH EVERY DAY AS NEEDED LEG SWELLING   Early, Sara E, NP  Active   gabapentin  (NEURONTIN ) 600 MG tablet [id number] Yes TAKE 1 TABLET (600 MG TOTAL) BY MOUTH EVERY MORNING AND 1.5 TABLETS (900 MG TOTAL) AT BEDTIME. Early, Sara E, NP  Active   hydrOXYzine  (VISTARIL ) 50 MG capsule [id number] Yes Take 1-2 tablets by mouth at bedtime for sleep and anxiety. Start with the lowest dose. May take 1 tablet up to 2 times during the day for acute anxiety symptoms only if needed. Tysinger, Alm RAMAN, [name]  Active   levothyroxine  (SYNTHROID ) 50 MCG tablet [id number] Yes TAKE 1 TABLET (50 MCG TOTAL) BY MOUTH EVERY OTHER DAY. BEFORE BREAKFAST. ALTERNATE WITH TABLET Early, Sara E, NP  Active   levothyroxine  (SYNTHROID ) 75 MCG tablet [id number] Yes TAKE 1 TABLET (75 MCG TOTAL) BY MOUTH EVERY OTHER DAY. BEFORE BREAKFAST. ALTERNATE WITH 50MCG TABLET Early, Sara E, NP  Active   meclizine  (ANTIVERT ) 50 MG tablet [id number] Yes Take 0.5 tablets (25 mg total) by mouth 2 (two) times daily as needed for dizziness. Just, Kelsea J, FNP  Active   metFORMIN  (GLUCOPHAGE ) 500 MG tablet [id number] Yes Take 1 tablet (500 mg total) by mouth 2 (two) times daily with a meal. Early, Camie BRAVO, NP  Active     Discontinued 05/03/24 1307 (Completed Course)   Multiple Vitamins-Minerals (MULTIVITAMIN WITH MINERALS) tablet [id number] Yes Take 1 tablet by mouth daily. [provider]  Active Self  ondansetron  (ZOFRAN -ODT) 8 MG disintegrating tablet [id number] Yes Take 1 tablet (8 mg total) by mouth every 8 (eight) hours as needed for nausea or vomiting. Early, Sara E, NP  Active   pantoprazole  (PROTONIX ) 40 MG tablet [id number] Yes Take 1 tablet (40 mg total) by mouth 2 (two) times daily. Early, Sara E, NP  Active   Polyvinyl  Alcohol-Povidone PF (REFRESH) 1.4-0.6 % SOLN [id number]  Place 1 drop into both eyes daily as needed (dry eyes). [provider]  Active Self  Probiotic Product (ALIGN) 4 MG CAPS [id number] Yes Take 1 capsule (4 mg total) by mouth daily. Dean Clarity, MD  Active Self    Discontinued 05/03/24 1308 (Completed Course)     Discontinued 05/03/24 1308 (Completed Course)   QUEtiapine  (SEROQUEL ) 300 MG tablet [id number] Yes TAKE 1 TABLET BY MOUTH EVERYDAY AT BEDTIME Early, Sara E, NP  Active   rizatriptan  (MAXALT -MLT) 10 MG disintegrating tablet [id number] Yes Take 1 tablet (10 mg total) by mouth as needed for migraine. May repeat in 2 hours if needed.  Maximum 2 tablets in 24 hours. Skeet Juliene SAUNDERS, DO  Active   valACYclovir  (VALTREX ) 500 MG tablet [id number] Yes Take 1 tablet (500 mg total)  by mouth daily. Early, Sara E, NP  Active   valACYclovir  (VALTREX ) 500 MG tablet [id number]  Take 1 tablet (500 mg total) by mouth 2 (two) times daily.   Active   zonisamide  (ZONEGRAN ) 100 MG capsule [id number] Yes Take 2 capsules (200 mg total) by mouth daily. Skeet Juliene SAUNDERS, DO  Active     Discontinued 05/03/24 1309 (Dose change)               Assessment/Plan:   Hyperlipidemia/ASCVD Risk Reduction: - Currently uncontrolled.  - Reviewed long term complications of uncontrolled cholesterol - Recommend to START Repatha  as prescribed, resending order to preferred pharmacy   - Patient now enrolled in [facility] grant for Repatha , should make medication no charge, attached card info to rx sent to pharmacy. Patient should receive card in mail, placing copy here for reference     Follow Up Plan: 1 month  Jon VEAR Lindau, PharmD Clinical Pharmacist [phone]

## 2024-05-04 ENCOUNTER — Other Ambulatory Visit: Payer: Self-pay | Admitting: Medical

## 2024-05-04 ENCOUNTER — Other Ambulatory Visit (HOSPITAL_BASED_OUTPATIENT_CLINIC_OR_DEPARTMENT_OTHER): Payer: Self-pay | Admitting: Nurse Practitioner

## 2024-05-04 DIAGNOSIS — G43709 Chronic migraine without aura, not intractable, without status migrainosus: Secondary | ICD-10-CM

## 2024-05-04 DIAGNOSIS — G5791 Unspecified mononeuropathy of right lower limb: Secondary | ICD-10-CM

## 2024-05-04 DIAGNOSIS — F331 Major depressive disorder, recurrent, moderate: Secondary | ICD-10-CM

## 2024-05-04 DIAGNOSIS — F339 Major depressive disorder, recurrent, unspecified: Secondary | ICD-10-CM

## 2024-05-04 DIAGNOSIS — F515 Nightmare disorder: Secondary | ICD-10-CM

## 2024-05-04 DIAGNOSIS — F411 Generalized anxiety disorder: Secondary | ICD-10-CM

## 2024-05-04 DIAGNOSIS — F4310 Post-traumatic stress disorder, unspecified: Secondary | ICD-10-CM

## 2024-05-04 DIAGNOSIS — F39 Unspecified mood [affective] disorder: Secondary | ICD-10-CM

## 2024-05-04 MED FILL — Albuterol Sulfate Inhal Aero 108 MCG/ACT (90MCG Base Equiv): RESPIRATORY_TRACT | 17 days supply | Qty: 6.7 | Fill #1 | Status: CN

## 2024-05-04 MED FILL — Quetiapine Fumarate Tab 300 MG: ORAL | 90 days supply | Qty: 90 | Fill #0 | Status: AC

## 2024-05-07 ENCOUNTER — Other Ambulatory Visit (HOSPITAL_BASED_OUTPATIENT_CLINIC_OR_DEPARTMENT_OTHER): Payer: Self-pay

## 2024-05-08 ENCOUNTER — Other Ambulatory Visit (HOSPITAL_BASED_OUTPATIENT_CLINIC_OR_DEPARTMENT_OTHER): Payer: Self-pay

## 2024-05-08 MED ORDER — DULOXETINE HCL 60 MG PO CPEP
ORAL_CAPSULE | Freq: Every day | ORAL | 3 refills | Status: AC
  Filled 2024-05-08: qty 90, fill #0

## 2024-05-08 MED ORDER — BUSPIRONE HCL 7.5 MG PO TABS
15.0000 mg | ORAL_TABLET | Freq: Three times a day (TID) | ORAL | 3 refills | Status: AC
  Filled 2024-05-08: qty 540, 90d supply, fill #0

## 2024-05-08 MED ORDER — GABAPENTIN 600 MG PO TABS
ORAL_TABLET | ORAL | 3 refills | Status: AC
  Filled 2024-05-08: qty 225, 90d supply, fill #0

## 2024-05-08 MED ORDER — HYDROXYZINE PAMOATE 50 MG PO CAPS
ORAL_CAPSULE | ORAL | 0 refills | Status: AC
  Filled 2024-05-08: qty 360, fill #0

## 2024-05-08 MED ORDER — BUPROPION HCL ER (XL) 150 MG PO TB24
150.0000 mg | ORAL_TABLET | Freq: Every day | ORAL | 3 refills | Status: AC
  Filled 2024-05-08: qty 90, 90d supply, fill #0

## 2024-05-14 ENCOUNTER — Other Ambulatory Visit (HOSPITAL_BASED_OUTPATIENT_CLINIC_OR_DEPARTMENT_OTHER): Payer: Self-pay

## 2024-05-27 ENCOUNTER — Other Ambulatory Visit: Payer: Self-pay | Admitting: Nurse Practitioner

## 2024-05-27 DIAGNOSIS — F39 Unspecified mood [affective] disorder: Secondary | ICD-10-CM

## 2024-05-27 MED FILL — Albuterol Sulfate Inhal Aero 108 MCG/ACT (90MCG Base Equiv): RESPIRATORY_TRACT | 17 days supply | Qty: 6.7 | Fill #1 | Status: AC

## 2024-05-28 ENCOUNTER — Other Ambulatory Visit (HOSPITAL_BASED_OUTPATIENT_CLINIC_OR_DEPARTMENT_OTHER): Payer: Self-pay

## 2024-05-28 ENCOUNTER — Other Ambulatory Visit: Payer: Self-pay

## 2024-05-28 NOTE — Telephone Encounter (Signed)
 Last appt. 03/26/24.

## 2024-05-29 ENCOUNTER — Other Ambulatory Visit (HOSPITAL_BASED_OUTPATIENT_CLINIC_OR_DEPARTMENT_OTHER): Payer: Self-pay

## 2024-05-29 MED ORDER — ALPRAZOLAM 0.5 MG PO TABS
ORAL_TABLET | ORAL | 3 refills | Status: AC
  Filled 2024-05-29: qty 30, 15d supply, fill #0
  Filled 2024-06-13: qty 30, 15d supply, fill #1

## 2024-05-31 ENCOUNTER — Other Ambulatory Visit

## 2024-05-31 DIAGNOSIS — E782 Mixed hyperlipidemia: Secondary | ICD-10-CM

## 2024-05-31 NOTE — Progress Notes (Signed)
 "  05/31/2024 Name: Jennifer Davidson MRN: [id number] DOB: 1956-05-02  Chief Complaint  Patient presents with   Medication Management   Hyperlipidemia   Medication Assistance    Jennifer Davidson is a 69 y.o. year old female who presented for a telephone visit.   They were referred to the pharmacist by their PCP for assistance in managing hyperlipidemia/cardiovascular risk reduction, medication access, and complex medication management.    Subjective:  Care Team: Primary Care Provider: Early, Sara E, NP   Medication Access/Adherence  Current Pharmacy:  [facility] [address], [address] [place] [address] Phone: [phone] Fax: [phone]   Patient reports affordability concerns with their medications: Yes  - repatha  Patient reports access/transportation concerns to their pharmacy: No  Patient reports adherence concerns with their medications:  No     Hyperlipidemia/ASCVD Risk Reduction  Current lipid lowering medications: Repatha  140mg  every 2 weeks, via the [employer] grant     Objective:  Lab Results  Component Value Date   HGBA1C 5.1 03/26/2024    Lab Results  Component Value Date   CREATININE 1.31 (H) 10/24/2023   BUN 14 10/24/2023   NA 138 10/24/2023   K 5.3 (H) 10/24/2023   CL 104 10/24/2023   CO2 21 10/24/2023    Lab Results  Component Value Date   CHOL 272 (H) 03/26/2024   HDL 71 03/26/2024   LDLCALC 132 (H) 03/26/2024   TRIG 390 (H) 03/26/2024   CHOLHDL 3.8 03/26/2024    Medications Reviewed Today     Reviewed by Lionell Jon DEL, RPH (Pharmacist) on 05/31/24 at 1515  Med List Status: <None>   Medication Order Taking? Sig Documenting Provider Last Dose Status Informant  albuterol  (VENTOLIN  HFA) 108 (90 Base) MCG/ACT inhaler [id number]  TAKE 2 PUFFS BY MOUTH EVERY 6 HOURS AS NEEDED FOR WHEEZE OR SHORTNESS OF BREATH Early, Sara E, NP  Active   ALPRAZolam  (XANAX ) 0.5 MG tablet [id number]  May take one tablet up to 2  times a day for anxiety. Avoid taking daily, if possible. Early, Sara E, NP  Active   AMBULATORY NON FORMULARY MEDICATION [id number]  Continuous positive airway pressure (CPAP) machine set at 15 cm of H2O pressure, with all supplemental supplies as needed. Early, Sara E, NP  Active   AMBULATORY NON FORMULARY MEDICATION [id number]  Continuous positive airway pressure (CPAP) machine set at 15 cm of H2O pressure. Supplemental supplies: Headgear with q 6 months, Full face mask q 3 months, Full face cushion q 3 months, 1 standard tubing q 3 months, 1 water  chamber q 6 months. 1 reusable filter q 6 months, 6 disposable filters q 3 months. Mask F-29. Early, Sara E, NP  Active   aspirin  EC 81 MG tablet [id number]  Take 1 tablet (81 mg total) by mouth daily. Shlomo Wilbert SAUNDERS, MD  Active Self  blood glucose meter kit and supplies KIT [id number]  Dispense based on patient and insurance preference. Use up to four times daily as directed. Check blood sugar three times weekly, then as needed. Early, Sara E, NP  Active Self  buPROPion  (WELLBUTRIN  XL) 150 MG 24 hr tablet [id number]  TAKE 1 TABLET BY MOUTH EVERY DAY Early, Sara E, NP  Active   busPIRone  (BUSPAR ) 7.5 MG tablet [id number]  TAKE 2 TABLETS (15 MG TOTAL) BY MOUTH 3 (THREE) TIMES DAILY. Early, Sara E, NP  Active   DULoxetine  (CYMBALTA ) 60 MG capsule [id number]  TAKE 1 CAPSULE (60  mg) BY MOUTH EVERY DAY Early, Sara E, NP  Active   Evolocumab  (REPATHA  SURECLICK) 140 MG/ML SOAJ [id number]  Inject 140 mg into the skin every 14 (fourteen) days. Early, Sara E, NP  Active   fluticasone  (FLONASE ) 50 MCG/ACT nasal spray [id number]  PLACE 2 SPRAYS AT BEDTIME INTO BOTH NOSTRILS Early, Sara E, NP  Active   furosemide  (LASIX ) 20 MG tablet [id number]  TAKE 1 TABLET BY MOUTH EVERY DAY AS NEEDED LEG SWELLING Early, Sara E, NP  Active   gabapentin  (NEURONTIN ) 600 MG tablet [id number]  TAKE 1 TABLET (600 MG TOTAL) BY MOUTH EVERY MORNING AND 1.5 TABLETS (900 MG TOTAL) AT BEDTIME. Early, Sara  E, NP  Active   hydrOXYzine  (VISTARIL ) 50 MG capsule [id number]  Take 1-2 tablets by mouth at bedtime for sleep and anxiety. Start with the lowest dose. May take 1 tablet up to 2 times during the day for acute anxiety symptoms only if needed. Early, Sara E, NP  Active   levothyroxine  (SYNTHROID ) 50 MCG tablet [id number]  TAKE 1 TABLET (50 MCG TOTAL) BY MOUTH EVERY OTHER DAY. BEFORE BREAKFAST. ALTERNATE WITH TABLET Early, Sara E, NP  Active   levothyroxine  (SYNTHROID ) 75 MCG tablet [id number]  Take 1 tablet (75 mcg total) by mouth every other day. Before breakfast. Alternate with 50mcg tablet Early, Sara E, NP  Active   meclizine  (ANTIVERT ) 50 MG tablet [id number]  Take 0.5 tablets (25 mg total) by mouth 2 (two) times daily as needed for dizziness. Just, Kelsea J, FNP  Active   metFORMIN  (GLUCOPHAGE ) 500 MG tablet [id number]  Take 1 tablet (500 mg total) by mouth 2 (two) times daily with a meal. Early, Sara E, NP  Active   Multiple Vitamins-Minerals (MULTIVITAMIN WITH MINERALS) tablet [id number]  Take 1 tablet by mouth daily. [provider]  Active Self  ondansetron  (ZOFRAN -ODT) 8 MG disintegrating tablet [id number]  Take 1 tablet (8 mg total) by mouth every 8 (eight) hours as needed for nausea or vomiting. Early, Sara E, NP  Active   pantoprazole  (PROTONIX ) 40 MG tablet [id number]  Take 1 tablet (40 mg total) by mouth 2 (two) times daily. Early, Sara E, NP  Active   Polyvinyl Alcohol-Povidone PF (REFRESH) 1.4-0.6 % SOLN [id number]  Place 1 drop into both eyes daily as needed (dry eyes). [provider]  Active Self  Probiotic Product (ALIGN) 4 MG CAPS [id number]  Take 1 capsule (4 mg total) by mouth daily. Dean Clarity, MD  Active Self  QUEtiapine  (SEROQUEL ) 300 MG tablet [id number]  TAKE 1 TABLET BY MOUTH EVERYDAY AT BEDTIME Early, Sara E, NP  Active   rizatriptan  (MAXALT -MLT) 10 MG disintegrating tablet [id number]  Take 1 tablet (10 mg total) by mouth as needed for migraine. May repeat  in 2 hours if needed.  Maximum 2 tablets in 24 hours. Skeet Juliene SAUNDERS, DO  Active   valACYclovir  (VALTREX ) 500 MG tablet [id number]  Take 1 tablet (500 mg total) by mouth daily. Early, Sara E, NP  Active   valACYclovir  (VALTREX ) 500 MG tablet [id number]  Take 1 tablet (500 mg total) by mouth 2 (two) times daily.   Active   zonisamide  (ZONEGRAN ) 100 MG capsule [id number]  Take 2 capsules (200 mg total) by mouth daily. Skeet Juliene SAUNDERS, DO  Active               Assessment/Plan:   Hyperlipidemia/ASCVD Risk Reduction: - Currently uncontrolled.  - Reviewed long term complications of uncontrolled cholesterol -Continue repatha   as prescribed, reach out if any issues with cost obtaining refill -Will coordinate a lipid panel check for 1 month from now at patient request     Follow Up Plan: as needed by patient  Jon VEAR Lindau, PharmD Clinical Pharmacist [phone]  "

## 2024-06-13 ENCOUNTER — Other Ambulatory Visit (HOSPITAL_BASED_OUTPATIENT_CLINIC_OR_DEPARTMENT_OTHER): Payer: Self-pay

## 2024-06-13 MED FILL — Albuterol Sulfate Inhal Aero 108 MCG/ACT (90MCG Base Equiv): RESPIRATORY_TRACT | 17 days supply | Qty: 6.7 | Fill #2 | Status: AC

## 2024-06-17 ENCOUNTER — Other Ambulatory Visit: Payer: Self-pay | Admitting: Nurse Practitioner

## 2024-06-17 DIAGNOSIS — M7989 Other specified soft tissue disorders: Secondary | ICD-10-CM

## 2024-06-18 ENCOUNTER — Other Ambulatory Visit: Payer: Self-pay

## 2024-06-18 ENCOUNTER — Other Ambulatory Visit (HOSPITAL_COMMUNITY): Payer: Self-pay

## 2024-06-18 ENCOUNTER — Telehealth (HOSPITAL_BASED_OUTPATIENT_CLINIC_OR_DEPARTMENT_OTHER): Payer: Self-pay

## 2024-06-18 ENCOUNTER — Other Ambulatory Visit (HOSPITAL_BASED_OUTPATIENT_CLINIC_OR_DEPARTMENT_OTHER): Payer: Self-pay

## 2024-06-18 NOTE — Telephone Encounter (Signed)
 PA request has been Received. New Encounter has been or will be created for follow up. For additional info see Pharmacy Prior Auth telephone encounter from 06/18/24.

## 2024-06-18 NOTE — Telephone Encounter (Signed)
 Pharmacy Patient Advocate Encounter  Received notification from [employer] that Prior Authorization for Repatha  SureClick 140MG /ML auto-injectors  has been APPROVED from 05/17/24 to 05/16/25. Ran test claim, Copay is $0. This test claim was processed through [facility]- copay amounts may vary at other pharmacies due to pharmacy/plan contracts, or as the patient moves through the different stages of their insurance plan.   PA #/Case ID/Reference #: [id number]

## 2024-07-31 ENCOUNTER — Ambulatory Visit: Payer: PRIVATE HEALTH INSURANCE

## 2024-10-04 ENCOUNTER — Ambulatory Visit: Admitting: Neurology

## 2025-04-01 ENCOUNTER — Encounter: Admitting: Nurse Practitioner
